# Patient Record
Sex: Female | Born: 1945 | Race: White | Hispanic: No | Marital: Married | Smoking: Never smoker
Health system: Southern US, Community
[De-identification: ages and names within clinical notes are randomized; demographics above are authoritative.]

## PROBLEM LIST (undated history)

## (undated) DIAGNOSIS — K648 Other hemorrhoids: Secondary | ICD-10-CM

## (undated) DIAGNOSIS — K31A0 Gastric intestinal metaplasia, unspecified: Secondary | ICD-10-CM

## (undated) DIAGNOSIS — E669 Obesity, unspecified: Secondary | ICD-10-CM

## (undated) DIAGNOSIS — I2699 Other pulmonary embolism without acute cor pulmonale: Secondary | ICD-10-CM

## (undated) DIAGNOSIS — A048 Other specified bacterial intestinal infections: Secondary | ICD-10-CM

## (undated) DIAGNOSIS — I10 Essential (primary) hypertension: Secondary | ICD-10-CM

## (undated) DIAGNOSIS — IMO0002 Reserved for concepts with insufficient information to code with codable children: Secondary | ICD-10-CM

## (undated) DIAGNOSIS — M5030 Other cervical disc degeneration, unspecified cervical region: Secondary | ICD-10-CM

## (undated) DIAGNOSIS — K3189 Other diseases of stomach and duodenum: Secondary | ICD-10-CM

## (undated) DIAGNOSIS — Z923 Personal history of irradiation: Secondary | ICD-10-CM

## (undated) DIAGNOSIS — E43 Unspecified severe protein-calorie malnutrition: Secondary | ICD-10-CM

## (undated) DIAGNOSIS — IMO0001 Reserved for inherently not codable concepts without codable children: Secondary | ICD-10-CM

## (undated) DIAGNOSIS — D126 Benign neoplasm of colon, unspecified: Secondary | ICD-10-CM

## (undated) DIAGNOSIS — K8020 Calculus of gallbladder without cholecystitis without obstruction: Secondary | ICD-10-CM

## (undated) DIAGNOSIS — K2950 Unspecified chronic gastritis without bleeding: Secondary | ICD-10-CM

## (undated) DIAGNOSIS — Z9221 Personal history of antineoplastic chemotherapy: Secondary | ICD-10-CM

## (undated) DIAGNOSIS — Z9289 Personal history of other medical treatment: Secondary | ICD-10-CM

## (undated) DIAGNOSIS — C259 Malignant neoplasm of pancreas, unspecified: Secondary | ICD-10-CM

## (undated) DIAGNOSIS — Z87828 Personal history of other (healed) physical injury and trauma: Secondary | ICD-10-CM

## (undated) DIAGNOSIS — G629 Polyneuropathy, unspecified: Secondary | ICD-10-CM

## (undated) DIAGNOSIS — K559 Vascular disorder of intestine, unspecified: Secondary | ICD-10-CM

## (undated) DIAGNOSIS — I480 Paroxysmal atrial fibrillation: Secondary | ICD-10-CM

## (undated) DIAGNOSIS — K5790 Diverticulosis of intestine, part unspecified, without perforation or abscess without bleeding: Secondary | ICD-10-CM

## (undated) DIAGNOSIS — J189 Pneumonia, unspecified organism: Secondary | ICD-10-CM

## (undated) DIAGNOSIS — M1990 Unspecified osteoarthritis, unspecified site: Secondary | ICD-10-CM

## (undated) DIAGNOSIS — C541 Malignant neoplasm of endometrium: Secondary | ICD-10-CM

## (undated) DIAGNOSIS — I4891 Unspecified atrial fibrillation: Secondary | ICD-10-CM

## (undated) DIAGNOSIS — K219 Gastro-esophageal reflux disease without esophagitis: Secondary | ICD-10-CM

## (undated) HISTORY — DX: Gastric intestinal metaplasia, unspecified: K31.A0

## (undated) HISTORY — PX: SHOULDER OPEN ROTATOR CUFF REPAIR: SHX2407

## (undated) HISTORY — PX: KNEE ARTHROSCOPY: SUR90

## (undated) HISTORY — DX: Benign neoplasm of colon, unspecified: D12.6

## (undated) HISTORY — DX: Unspecified osteoarthritis, unspecified site: M19.90

## (undated) HISTORY — PX: BACK SURGERY: SHX140

## (undated) HISTORY — PX: FRACTURE SURGERY: SHX138

## (undated) HISTORY — DX: Other hemorrhoids: K64.8

## (undated) HISTORY — DX: Malignant neoplasm of endometrium: C54.1

## (undated) HISTORY — PX: ANKLE RECONSTRUCTION: SHX1151

## (undated) HISTORY — PX: TOTAL SHOULDER ARTHROPLASTY: SHX126

## (undated) HISTORY — DX: Other specified bacterial intestinal infections: A04.8

## (undated) HISTORY — PX: JOINT REPLACEMENT: SHX530

## (undated) HISTORY — DX: Reserved for inherently not codable concepts without codable children: IMO0001

## (undated) HISTORY — DX: Obesity, unspecified: E66.9

## (undated) HISTORY — DX: Essential (primary) hypertension: I10

## (undated) HISTORY — DX: Paroxysmal atrial fibrillation: I48.0

## (undated) HISTORY — DX: Reserved for concepts with insufficient information to code with codable children: IMO0002

## (undated) HISTORY — PX: TUBAL LIGATION: SHX77

## (undated) HISTORY — DX: Personal history of irradiation: Z92.3

## (undated) HISTORY — DX: Other diseases of stomach and duodenum: K31.89

## (undated) HISTORY — DX: Personal history of other (healed) physical injury and trauma: Z87.828

## (undated) HISTORY — PX: HEEL SPUR SURGERY: SHX665

## (undated) HISTORY — DX: Unspecified chronic gastritis without bleeding: K29.50

## (undated) HISTORY — DX: Other cervical disc degeneration, unspecified cervical region: M50.30

## (undated) HISTORY — DX: Calculus of gallbladder without cholecystitis without obstruction: K80.20

---

## 1998-01-18 ENCOUNTER — Inpatient Hospital Stay (HOSPITAL_COMMUNITY): Admission: RE | Admit: 1998-01-18 | Discharge: 1998-01-19 | Payer: Self-pay | Admitting: Specialist

## 1998-10-30 ENCOUNTER — Encounter: Admission: RE | Admit: 1998-10-30 | Discharge: 1998-12-14 | Payer: Self-pay | Admitting: Specialist

## 2003-10-11 ENCOUNTER — Encounter: Admission: RE | Admit: 2003-10-11 | Discharge: 2003-10-11 | Payer: Self-pay | Admitting: Internal Medicine

## 2003-10-24 ENCOUNTER — Inpatient Hospital Stay (HOSPITAL_COMMUNITY): Admission: AD | Admit: 2003-10-24 | Discharge: 2003-10-26 | Payer: Self-pay | Admitting: Orthopedic Surgery

## 2003-11-29 ENCOUNTER — Encounter: Admission: RE | Admit: 2003-11-29 | Discharge: 2004-01-02 | Payer: Self-pay | Admitting: Orthopedic Surgery

## 2005-09-23 DIAGNOSIS — D126 Benign neoplasm of colon, unspecified: Secondary | ICD-10-CM

## 2005-09-23 HISTORY — DX: Benign neoplasm of colon, unspecified: D12.6

## 2006-06-17 ENCOUNTER — Encounter: Admission: RE | Admit: 2006-06-17 | Discharge: 2006-06-17 | Payer: Self-pay | Admitting: Specialist

## 2006-06-23 ENCOUNTER — Ambulatory Visit: Payer: Self-pay | Admitting: Internal Medicine

## 2006-07-01 ENCOUNTER — Ambulatory Visit: Payer: Self-pay | Admitting: Internal Medicine

## 2006-07-01 ENCOUNTER — Encounter (INDEPENDENT_AMBULATORY_CARE_PROVIDER_SITE_OTHER): Payer: Self-pay | Admitting: *Deleted

## 2007-02-25 ENCOUNTER — Encounter: Admission: RE | Admit: 2007-02-25 | Discharge: 2007-02-25 | Payer: Self-pay | Admitting: Specialist

## 2007-12-17 ENCOUNTER — Encounter: Admission: RE | Admit: 2007-12-17 | Discharge: 2007-12-17 | Payer: Self-pay | Admitting: Orthopedic Surgery

## 2008-01-22 ENCOUNTER — Ambulatory Visit (HOSPITAL_COMMUNITY): Admission: RE | Admit: 2008-01-22 | Discharge: 2008-01-23 | Payer: Self-pay | Admitting: Orthopedic Surgery

## 2008-05-18 ENCOUNTER — Ambulatory Visit: Payer: Self-pay | Admitting: Cardiovascular Disease

## 2008-06-14 ENCOUNTER — Ambulatory Visit: Payer: Self-pay

## 2008-06-14 ENCOUNTER — Encounter: Payer: Self-pay | Admitting: Cardiovascular Disease

## 2008-07-18 ENCOUNTER — Ambulatory Visit: Payer: Self-pay | Admitting: Cardiovascular Disease

## 2008-09-23 DIAGNOSIS — Z87828 Personal history of other (healed) physical injury and trauma: Secondary | ICD-10-CM

## 2008-09-23 HISTORY — DX: Personal history of other (healed) physical injury and trauma: Z87.828

## 2008-11-05 ENCOUNTER — Emergency Department (HOSPITAL_COMMUNITY): Admission: EM | Admit: 2008-11-05 | Discharge: 2008-11-05 | Payer: Self-pay | Admitting: Emergency Medicine

## 2008-12-05 ENCOUNTER — Encounter: Admission: RE | Admit: 2008-12-05 | Discharge: 2008-12-05 | Payer: Self-pay | Admitting: Family Medicine

## 2009-01-01 ENCOUNTER — Encounter: Admission: RE | Admit: 2009-01-01 | Discharge: 2009-01-01 | Payer: Self-pay | Admitting: Orthopedic Surgery

## 2009-02-24 ENCOUNTER — Encounter (INDEPENDENT_AMBULATORY_CARE_PROVIDER_SITE_OTHER): Payer: Self-pay | Admitting: *Deleted

## 2009-08-06 ENCOUNTER — Emergency Department (HOSPITAL_COMMUNITY): Admission: EM | Admit: 2009-08-06 | Discharge: 2009-08-06 | Payer: Self-pay | Admitting: Emergency Medicine

## 2009-11-18 ENCOUNTER — Encounter: Admission: RE | Admit: 2009-11-18 | Discharge: 2009-11-18 | Payer: Self-pay | Admitting: Unknown Physician Specialty

## 2010-03-30 ENCOUNTER — Encounter: Admission: RE | Admit: 2010-03-30 | Discharge: 2010-03-30 | Payer: Self-pay | Admitting: Specialist

## 2010-06-14 ENCOUNTER — Encounter: Admission: RE | Admit: 2010-06-14 | Discharge: 2010-06-14 | Payer: Self-pay | Admitting: Family Medicine

## 2010-09-23 DIAGNOSIS — C541 Malignant neoplasm of endometrium: Secondary | ICD-10-CM

## 2010-09-23 HISTORY — DX: Malignant neoplasm of endometrium: C54.1

## 2010-09-23 HISTORY — PX: ABDOMINAL HYSTERECTOMY: SHX81

## 2010-11-13 ENCOUNTER — Other Ambulatory Visit: Payer: Self-pay | Admitting: Obstetrics and Gynecology

## 2010-11-21 ENCOUNTER — Ambulatory Visit: Payer: 59 | Attending: Gynecologic Oncology | Admitting: Gynecologic Oncology

## 2010-11-21 DIAGNOSIS — C549 Malignant neoplasm of corpus uteri, unspecified: Secondary | ICD-10-CM | POA: Insufficient documentation

## 2010-11-21 DIAGNOSIS — Z800 Family history of malignant neoplasm of digestive organs: Secondary | ICD-10-CM | POA: Insufficient documentation

## 2010-11-21 DIAGNOSIS — Z803 Family history of malignant neoplasm of breast: Secondary | ICD-10-CM | POA: Insufficient documentation
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
## 2011-01-08 LAB — URINE MICROSCOPIC-ADD ON

## 2011-01-08 LAB — POCT I-STAT, CHEM 8
Chloride: 103 mEq/L (ref 96–112)
Creatinine, Ser: 0.7 mg/dL (ref 0.4–1.2)
Hemoglobin: 13.6 g/dL (ref 12.0–15.0)
Potassium: 3.7 mEq/L (ref 3.5–5.1)
Sodium: 138 mEq/L (ref 135–145)

## 2011-01-08 LAB — URINALYSIS, ROUTINE W REFLEX MICROSCOPIC
Bilirubin Urine: NEGATIVE
Glucose, UA: NEGATIVE mg/dL
Hgb urine dipstick: NEGATIVE
Ketones, ur: NEGATIVE mg/dL
Protein, ur: NEGATIVE mg/dL

## 2011-02-05 NOTE — Op Note (Signed)
NAME:  [name], [name] NO.:  [id number]   MEDICAL RECORD NO.:  [id number]          PATIENT TYPE:  OIB   LOCATION:  5001                         FACILITY:  [facility]   PHYSICIAN:  Almedia Balls. Ranell Patrick, M.D. DATE OF BIRTH:  03/10/1946   DATE OF PROCEDURE:  DATE OF DISCHARGE:                               OPERATIVE REPORT   PREOPERATIVE DIAGNOSIS:  Left shoulder end-stage osteoarthritis.   POSTOPERATIVE DIAGNOSIS:  Left shoulder end-stage osteoarthritis.   PROCEDURE PERFORMED:  Left total shoulder replacement using DePuy Global  Advantage System with Anchor Peg Glenoid.   ATTENDING PHYSICIAN:  Almedia Balls. Ranell Patrick, MD   ASSISTANT:  Donnie Coffin. Durwin Nora, P.A.   ANESTHESIA:  General anesthesia plus interscalene block anesthesia was  used.   ESTIMATED BLOOD LOSS:  200 mL.   FLUIDS REPLACEMENT:  2300 mL crystalloid and 500 mL of Hextend.   URINE OUTPUT:  150 mL.   INSTRUMENT COUNT:  Correct.   COMPLICATIONS:  None.   Preoperative antibiotics were given.   INDICATIONS:  The patient is a 64 year old female with worsening left  shoulder pain secondary to severe arthritis.  The patient has failed  conservative management consistent with injections, anti-inflammatories,  active modifications now presents for operative shoulder replacement.  Informed consent was obtained.   DESCRIPTION OF PROCEDURE:  After an adequate level of anesthesia was  achieved, the patient was positioned in a modified beach-chair position.  Left shoulder exam under anesthesia, she had passive range of motion 20  degrees, internal rotation 10 degrees, arm abducted, port elevation 90  degrees.  We then sterilely prepped and draped the left shoulder in the  usual manner, we began at the shoulder through a deltopectoral approach  using the cauda equina reference point extending down to the anterior  humeral line.  Dissection was done through subcutaneous tissues using  Bovie electrocautery.  We found the  cephalic vein, protected that and  took it laterally with the deltoid.  The pectoralis was taken medially  and the upper 1.5 cm pectoralis was released.  Conjoined tendon taken  medially as well.  This revealed the bicipital groove, we took off the  subscapularis sharply using the needle-point Bovie about a 0.5 cm medial  to the left tuberosity.  We placed #2 FiberWire sutures in a modified  Mason-Allen suture technique into the free end of the tendon.  We  divided that free from the anterior capsule and removed some of that  capsule, progressively released the capsule off the inferior humerus and  removed large osteophytes inferiorly.  At this point, we could  appropriately translate the humerus anteriorly and externally rotate  such that we could perform osteotomy.  We used the neck resection guide  and reference of the insertion of the rotator cuff on the greater  tuberosity and made her osteotomy with the elbow at the patient side and  a forearm externally rotated approximately 10-15 degrees.  This gave [place]  10-15 degrees of retroversion on our cut.  We were happy with our  initial cut and went ahead and prepared the rest of the humerus using  sequential  reamers up to a size 12 and then using the box osteotome  followed by the serial broaching up to size 12 stem.  With the 12 stem  in good position, rechecked our retroversion on the humeral side.  We  are happy with that and at this point, went ahead and retracted our  humerus posteriorly, we did a 360-degree capsule release removing all of  the labrum.  There was complete erosion of the articular cartilage on  the glenoid.  The glenoid sized up to a size 40.  We drilled our central  hole marking at 12 o'clock, 6 o'clock, 3 and 9 o'clock positions and  drilling centrally and then placing our Anchor Peg glenoid guide, again  referencing off the inferior scapular neck at the 6 o'clock position,  drilled our superior and anterior, inferior  and posterior drill holes,  placed our trials 40, anchor peg glenoid in place and impacted that in,  has had a great fit.  We had actually reamed with a 40 reamer prior to  drilling holes for the Anchor Peg glenoid.  We did not have any  significant bone loss either of the periphery or centrally from cystic  formation.  At this point, we went ahead and cemented our 40 Anchor Peg  glenoid in place.  We pressurized DePuy Smartset cement anterior to the  3 holes for the peripheral pegs, the central peg, we went ahead into  left without cement and impacted the real polyethylene insert in place,  held that until 15 minutes for the labs and all the cement was hardened,  removed the excess cement and then directed our attention towards the  humeral side.  We removed the trial component.  We then impaction  grafted the real 12 stem in appropriate version with available bones in  the humeral head.  This had a nice tight fit.  We then looked at the  remaining humeral coverage that we needed and this fit nicely with a 44  x 18 eccentric head, which rotated to the superior posterior position  for good coverage.  We had actually placed #2 FiberWires in a mattress  fashion prior to placing our stem and repaired her subscapularis using  full thick sutures with the FiberWire.  This gave a nice repair.  We  also resected the upper portion that would join the rotator interval  giving a very secure repair with at least 6 or 7 stranded sutures  passing through the subscapularis and connecting directly with bone-to-  bone tunnels.  At this point, we took the shoulder through a full range  of motion with excellent forward elevation, may able to easily rest the  arm on the stomach and rotated up about 20-30 degrees even with the  subscapularis repaired, thoroughly irrigated and then closed the  deltopectoral interval with 0-Vicryl suture followed by 2-0 Vicryl  subcutaneous closure and 4-0 Monocryl for the skin.   Steri-Strips were  applied followed by sterile dressing.  The patient tolerated the surgery  well.      Almedia Balls. Ranell Patrick, M.D.  Electronically Signed     SRN/MEDQ  D:  01/22/2008  T:  01/23/2008  Job:  [id number]

## 2011-02-05 NOTE — Assessment & Plan Note (Signed)
[name]                            CARDIOLOGY OFFICE NOTE   [name], [name]                    MRN:          [id number]  DATE:07/18/2008                            DOB:          04-May-1946    [name] returns today for followup.  She has had atypical chest pain,  shortness breath, and palpitations.  She is a bit anxious.  She had a  normal stress echo performed on June 15, 2008.  Since I last saw  her, her shortness of breath is stable and not worsening, it continues  to be functional.  No PND or orthopnea.  No evidence of heart failure.  Her palpitations however have had occasional worsening.  After I last  saw her at the end of August, she had 3 days of palpitations.  She  indicates that her heart was beating fast and pounding in her chest.  She did not take any nitro.  After a while, she had a bit of atypical  chest pain.  The whole bout seemed to start with her shoulder pain.  She  is status post shoulder surgery and still has poor range of motion.  She  started to have pain in the left shoulder, which radiated up the neck  and down the rest of the arm.  She takes Robaxin for this on occasion.   I told [name] that I thought her heart was fine and that if she were  to have recurrent palpitations, we could arrange an event monitor for.   Review of systems is otherwise negative.   She is allergic to SULFA and CODEINE.   She is on Osteo Bi-Flex and Robaxin.   She has p.r.n. Tylenol, Ambien, and propranolol at home.  She did not  take any propranolol for her palpitations.   Exam is remarkable for an overweight white female in no distress.  Her  blood pressure is 128/87, pulse 84 and regular, respiratory rate 14,  afebrile, weight 208.  HEENT unremarkable.  Carotids normal without  bruit.  No lymphadenopathy, thyromegaly, or JVP elevation.  Lungs are  clear, good diaphragmatic motion.  No wheezing.  S1-S2.  Normal heart  sounds.  PMI normal.  Abdomen is benign.  Bowel sounds positive.  No  AAA, no tenderness, no bruit, no hepatosplenomegaly or hepatojugular  reflux, no tenderness.  Distal pulses are intact.  No edema.  Neuro  nonfocal.  Skin warm and dry.  No muscular weakness.  She has poor range  of motion and unable to move her left arm above her shoulder.   IMPRESSION:  1. Shortness of breath, stable, deconditioning central obesity.  No      evidence of cardiopulmonary disease.  2. Palpitations benign.  Follow up event monitor for current.      Continue p.r.n., propranolol.  3. Atypical chest pain, normal stress echocardiogram.  Continue to      follow.  4. Left shoulder surgery with poor range of motion.  Continue Physical      Therapy/Occupational Therapy.   I will see her in 6 months.     Theron Arista  Phillips Hay, MD, [facility]  Electronically Signed    PCN/MedQ  DD: 07/18/2008  DT: 07/18/2008  Job #: [id number]

## 2011-02-05 NOTE — Assessment & Plan Note (Signed)
[facility]                            CARDIOLOGY OFFICE NOTE   [name], [name]                    MRN:          [id number]  DATE:05/18/2008                            DOB:          12-25-1945    A 65 year old patient referred for chest pain, palpitations, shortness  of breath.   [name] is a pleasant high-strung individual who was referred by Dr.  Collins Scotland for the above symptoms.  The patient has been having these  symptoms over the last couple of months.   She indicates onset of sudden palpitations.  She has had multiple  episodes of them and she gets, they can last good part of the day.  She  gets associated chest tightness with them and also some shortness of  breath.  She is status post recent left shoulder replacement and still  rehabbing this.  She also has significant anxiety.  She would not tell  me what it is that gets her upset, but her husband indicates that she  would like to fix every problem in the world.   The patient has not had any previously documented history of coronary  artery disease or atrial arrhythmias.  There has been no history of PE.  She does not smoke and does not have chronic COPD or lung disease.   Her review of systems otherwise remarkable for occasional headaches.  There has been no active wheezing.   She normally sees Dr. Collins Scotland for general healthcare.  She has had some  insomnia.   Her past medical history is otherwise remarkable for left shoulder  replacement by Dr. Ranell Patrick, previous knee problems and tubal ligation.   The patient is happily married.  She has two children.  She sometimes  drives a bus.  She is starting to get back towards walking on a  treadmill.  She walked a mile yesterday without sequela and is just  getting back to it after her shoulder surgery.   Family history is remarkable for mother having heart failure, as well as  father having heart failure.  She did not give ages of  their time of  death.  There is no premature coronary disease.   MEDICATIONS:  Her current medications include;  1. Skelaxin 800 t.i.d.  2. Zolpidem 10 a day.  3. Osteo Bi-Flex   She is allergic and SULFA and CODEINE.   PHYSICAL EXAMINATION:  GENERAL:  Remarkable for an overweight white  female who is animated.  VITAL SIGNS:  Her blood pressure is 126/70, pulse is 75 and regular,  respiratory rate 14, afebrile.  Weight is 210.  HEENT:  Unremarkable.  NECK:  Carotids are without bruit, no lymphadenopathy, no thyromegaly,  or JVP elevation.  LUNGS:  Clear to diaphragmatic motion.  No wheezing.  HEART:  S1 and S2.  Normal heart sounds.  PMI not palpable.  ABDOMEN:  Benign.  Bowel sounds positive.  No AAA, no tenderness, no  bruit, no hepatosplenomegaly, or no hepatojugular reflux.  No  tenderness.  EXTREMITIES:  Distal pulses were intact.  No edema.  NEURO:  Nonfocal.  SKIN:  Warm  and dry.  She continues to have slightly decreased range of  motion in her left upper extremity after her shoulder surgery.   EKG is normal.   IMPRESSION:  1. Palpitations, they sound benign, p.r.n. Inderal given to take as      needed, likely related to anxiety state.  The patient will call to      get an event monitor if she has recurrent episodes.  She has not      had any in the past week.  I am assuming that Dr. Collins Scotland did typical      lab work including ruling out significant anemia or thyroid      disease.  2. Chest pain, atypical.  Followup stress echo.  EKG at baseline is      normal.  3. Dyspnea, likely related to being out of shape and overweight.      Echocardiogram to assess right ventricular and left ventricular      function will be done as part of her stress echo.  4. Recent shoulder surgery.  Continue rehab.  Increased range of      motion needed as the patient appears to have a slight frozen      shoulder.   I will see her back in 8-10 weeks and we will see if she has had to take   her Inderal, but I suspect she has structurally normal heart.     Noralyn Pick. Eden Emms, MD, [facility]  Electronically Signed    PCN/MedQ  DD: 05/18/2008  DT: 05/19/2008  Job #: [id number]   cc:   Tammy R. Collins Scotland, M.D.

## 2011-05-06 ENCOUNTER — Other Ambulatory Visit: Payer: Self-pay | Admitting: Family Medicine

## 2011-05-06 DIAGNOSIS — Z1231 Encounter for screening mammogram for malignant neoplasm of breast: Secondary | ICD-10-CM

## 2011-06-12 ENCOUNTER — Ambulatory Visit: Payer: 59 | Attending: Gynecologic Oncology | Admitting: Gynecologic Oncology

## 2011-06-12 DIAGNOSIS — C549 Malignant neoplasm of corpus uteri, unspecified: Secondary | ICD-10-CM | POA: Insufficient documentation

## 2011-06-12 DIAGNOSIS — Z90710 Acquired absence of both cervix and uterus: Secondary | ICD-10-CM | POA: Insufficient documentation

## 2011-06-12 DIAGNOSIS — Z9079 Acquired absence of other genital organ(s): Secondary | ICD-10-CM | POA: Insufficient documentation

## 2011-06-14 NOTE — Consult Note (Signed)
NAME:  [name], [name] NO.:  [id number]  MEDICAL RECORD NO.:  [id number]  LOCATION:  GYN                          FACILITY:  [facility]  PHYSICIAN:  Alaira Level A. Duard Brady, MD    DATE OF BIRTH:  1946-04-14  DATE OF CONSULTATION: DATE OF DISCHARGE:                                CONSULTATION   [name] is a very pleasant 65 year old with postmenopausal bleeding. She went through menopause in her early 24s and never took any HRT. 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  On November 30, 2010 she underwent a total robotic hysterectomy, bilateral salpingo-oophorectomy.  Operative findings included a uterus with a grade 1 lesion with minimal invasion on frozen section.  She had a left ovarian fibroma and a normal-appearing right tube and ovary.  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  There was 18% myometrial invasion.  No lymphovascular space involvement, negative adnexa and the washings were negative.  Her final stage was a 1A grade 1 endometrioid adenocarcinoma. She was dispositioned to close followup.  She had a postoperative check with me at [facility] in April and comes in today for her first surveillance visit.  She is overall doing great.  She has lost approximately 30 pounds since we first saw her.  She states, when I told her that her obesity was linked to the endometrial cancer, that I put the "fear of God" in her and she really decided to change her life.  She is walking two to four miles per day.  She is trying to decrease her p.o. and is really feeling quite well, she states she feels excellent.  She has overall felt "cold" since her surgery.  Herb Grays is her primary physician and she has not gone back to see her.  She does not feel dizzy or lightheaded but has been noted to have a few low blood pressures since her surgery, most recently being 82/60 today.  There is no vasomotor  symptoms otherwise.  She has no dizziness, lightheadedness. She does not feel faint.  She denies any change in her bowel or bladder habits.  She has no vaginal bleeding.  10-point review of systems is negative.  PHYSICAL EXAMINATION:  Weight 182 pounds down from 216, height 5 feet 4 inches.  Blood pressure 82/60, pulse 80, respirations 16, temperature 97.7.  Well-nourished, well-developed female in no acute distress.  Neck is supple.  There is no lymphadenopathy, no adenopathy.  No thyromegaly. LUNGS:  Clear to auscultation bilaterally.  CARDIOVASCULAR:  Regular rate and rhythm.  ABDOMEN:  Shows well-healed surgical incisions. Abdomen is soft, nontender, nondistended.  No palpable masses or positive splenomegaly.  Groins are negative for adenopathy. EXTREMITIES:  She has no edema.  PELVIC:  External genitalia is within normal limits.  The vagina is atrophic.  The vaginal cuff is visualized. There is no visible lesions.  Bimanual examination reveals no masses or nodularity.  Rectal confirms.  ASSESSMENT:  This is a 65 year old with a stage IA grade 1 endometrioid adenocarcinoma who clinically has no evidence of recurrent disease.  PLAN:  She will see Dr. Vincente Poli in 6 months at which time she will  have a Pap smear.  She will return to see me in 1 year.     Doni Bacha A. Duard Brady, MD     PAG/MEDQ  D:  06/12/2011  T:  06/13/2011  Job:  [id number]  cc:   Marcelino Duster L. Vincente Poli, M.D. Fax: [phone]  Telford Nab, R.N. [address] [place], [place] [address]  Herb Grays, MD  Electronically Signed by Cleda Mccreedy MD on 06/14/2011 07:41:12 AM

## 2011-06-17 ENCOUNTER — Ambulatory Visit
Admission: RE | Admit: 2011-06-17 | Discharge: 2011-06-17 | Disposition: A | Discharge status: Home / Self Care | Payer: 59 | Source: Ambulatory Visit | Attending: Family Medicine | Admitting: Family Medicine

## 2011-06-17 DIAGNOSIS — Z1231 Encounter for screening mammogram for malignant neoplasm of breast: Secondary | ICD-10-CM

## 2011-06-18 LAB — BASIC METABOLIC PANEL
GFR calc non Af Amer: >60
Glucose, Bld: 102 — ABNORMAL HIGH
Potassium: 4.4
Sodium: 136

## 2011-06-18 LAB — URINALYSIS, ROUTINE W REFLEX MICROSCOPIC
Glucose, UA: NEGATIVE
Specific Gravity, Urine: 1.011
pH: 7

## 2011-06-18 LAB — URINE MICROSCOPIC-ADD ON

## 2011-06-18 LAB — DIFFERENTIAL
Eosinophils Relative: 3
Lymphocytes Relative: 33
Lymphs Abs: 2.4
Monocytes Absolute: 0.6

## 2011-06-18 LAB — CBC
HCT: 36.3
Hemoglobin: 12.8
WBC: 7.2

## 2011-06-18 LAB — ABO/RH: ABO/RH(D): O POS

## 2011-08-29 ENCOUNTER — Encounter: Payer: Self-pay | Admitting: Internal Medicine

## 2011-10-02 ENCOUNTER — Encounter: Payer: Self-pay | Admitting: Internal Medicine

## 2011-10-17 ENCOUNTER — Encounter: Payer: Self-pay | Admitting: Internal Medicine

## 2011-10-17 ENCOUNTER — Ambulatory Visit (AMBULATORY_SURGERY_CENTER): Payer: 59 | Admitting: *Deleted

## 2011-10-17 ENCOUNTER — Telehealth: Payer: Self-pay | Admitting: *Deleted

## 2011-10-17 DIAGNOSIS — Z1211 Encounter for screening for malignant neoplasm of colon: Secondary | ICD-10-CM

## 2011-10-17 DIAGNOSIS — Z86010 Personal history of colonic polyps: Secondary | ICD-10-CM

## 2011-10-17 DIAGNOSIS — Z800 Family history of malignant neoplasm of digestive organs: Secondary | ICD-10-CM

## 2011-10-17 MED ORDER — PEG-KCL-NACL-NASULF-NA ASC-C 100 G PO SOLR: ORAL | Status: DC

## 2011-10-17 NOTE — Telephone Encounter (Signed)
If she is interested I can evaluate her abd. Pain in the office and then decide what to do. For now she is scheduled for screening colonoscopy.

## 2011-10-17 NOTE — Progress Notes (Signed)
Pt states that her family doctor, Herb Grays, has been treating her for a "bacterial infection" in her stomach and large intestine.  She has finished her Prevpack as prescribed.  She states she still has abdominal pain to her left side that radiates to her back.  No relief.  She denies diarrhea, bleeding, vomiting.  Dr. Juanda Chance made aware of this prior to procedure

## 2011-10-17 NOTE — Telephone Encounter (Signed)
Dr. Juanda Chance, This is FYI.  She came in for her PV today.  Pt states that her family doctor, Herb Grays, has been treating her for a "bacterial infection" in her stomach and large intestine.  She has finished her Prevpack as prescribed.  She states she still has abdominal pain to her left side that radiates to her back.  No relief.  She denies diarrhea, bleeding, vomiting.   I told her that I would let you know about this prior to her colonoscopy in case you wanted to do anything else.  Thank you, Baxter Hire

## 2011-10-18 NOTE — Telephone Encounter (Signed)
She states that she is ok with just having her colonoscopy as scheduled.

## 2011-10-22 ENCOUNTER — Encounter: Payer: Self-pay | Admitting: Cardiology

## 2011-10-23 ENCOUNTER — Ambulatory Visit (INDEPENDENT_AMBULATORY_CARE_PROVIDER_SITE_OTHER): Payer: 59 | Admitting: Cardiology

## 2011-10-23 ENCOUNTER — Encounter: Payer: Self-pay | Admitting: Cardiology

## 2011-10-23 DIAGNOSIS — M542 Cervicalgia: Secondary | ICD-10-CM

## 2011-10-23 DIAGNOSIS — I4891 Unspecified atrial fibrillation: Secondary | ICD-10-CM

## 2011-10-23 DIAGNOSIS — I480 Paroxysmal atrial fibrillation: Secondary | ICD-10-CM | POA: Insufficient documentation

## 2011-10-23 DIAGNOSIS — R079 Chest pain, unspecified: Secondary | ICD-10-CM

## 2011-10-23 LAB — CBC WITH DIFFERENTIAL/PLATELET
Basophils Relative: 0.5 % (ref 0.0–3.0)
Eosinophils Absolute: 0.4 10*3/uL (ref 0.0–0.7)
Lymphs Abs: 2.2 10*3/uL (ref 0.7–4.0)
MCHC: 34.2 g/dL (ref 30.0–36.0)
MCV: 87 fl (ref 78.0–100.0)
Monocytes Absolute: 0.5 10*3/uL (ref 0.1–1.0)
Neutrophils Relative %: 45.8 % (ref 43.0–77.0)
RBC: 4.47 Mil/uL (ref 3.87–5.11)

## 2011-10-23 LAB — BASIC METABOLIC PANEL
BUN: 19 mg/dL (ref 6–23)
CO2: 28 mEq/L (ref 19–32)
Chloride: 103 mEq/L (ref 96–112)
Creatinine, Ser: 0.7 mg/dL (ref 0.4–1.2)

## 2011-10-23 MED ORDER — METOPROLOL SUCCINATE ER 50 MG PO TB24: 50.0000 mg | ORAL_TABLET | Freq: Every day | ORAL | Status: DC

## 2011-10-23 MED ORDER — RIVAROXABAN 20 MG PO TABS: 20.0000 mg | ORAL_TABLET | Freq: Every day | ORAL | Status: DC

## 2011-10-23 NOTE — Assessment & Plan Note (Signed)
Patient woke up last night with neck and bilateral arm pain associated with tachypalpitations.  She also has had on and off nonexertional chest pressure.  She has new-onset atrial fibrillation.  Given these symptoms and new-onset atrial fibrillation, I will get an ETT-myoview to assess for ischemia.

## 2011-10-23 NOTE — Patient Instructions (Addendum)
Your physician recommends that you schedule a follow-up appointment in 2 weeks with Dr. Shirlee Latch.  Your physician has requested that you have an echocardiogram. Echocardiography is a painless test that uses sound waves to create images of your heart. It provides your doctor with information about the size and shape of your heart and how well your heart's chambers and valves are working. This procedure takes approximately one hour. There are no restrictions for this procedure.  Your physician has requested that you have en exercise stress myoview. For further information please visit [email or web address] Please follow instruction sheet, as given.  If you start Xarelto 20mg  daily, stop coumadin(warfarin).  If you stay on coumadin, you should call our office and schedule a visit in the coumadin clinic.  Start Toprol XL 50 mg daily  Your physician recommends that you have the following lab work today:  CBC & BMET

## 2011-10-23 NOTE — Assessment & Plan Note (Addendum)
Patient is in atrial fibrillation today.  I suspect she may have been in atrial fibrillation for the last 4 weeks or so when she has been noting her heart flutter.  She is symptomatic with atrial fibrillation.  Her episode last night may have been due to uncontrolled rate.  Today the HR is in the 80s.  CHADSVASC score is 1 (female gender).   - Start Toprol XL 50 mg daily.  - We talked about DCCV.  I think this would be reasonable as this is the patient's first documented episode and she is symptomatic.   - Stop coumadin (only took 1 dose).  Start rivaroxaban 20 mg daily.  After 1 month on rivaroxaban, I will bring her to the hospital to cardiovert her.   - CBC, BMET, TSH needed.  - Echocardiogram.

## 2011-10-23 NOTE — Progress Notes (Signed)
PCP: Dr. Collins Scotland  66 yo presents for evaluation of atrial fibrillation.  For the last 4 weeks or so, she has noted her heart fluttering and beating irregularly.  At times it is uncomfortable.  From time to time she gets pressure in her chest.  This is not related to exertion. Yesterday, she had an ECG done when she went for an outpatient surgery (for a corn on her foot) that showed atrial fibrillation.  She was sent to Dr. Alda Berthold office, where atrial fibrillation was confirmed and she was given a prescription for warfarin.  Last night, she woke up short of breath with her heart racing.  She had severe pain in her neck and bilateral arms.  This lasted for 2-3 hours then resolved.   She has not been getting exertional dyspnea or exertional chest pain, but the fluttering sensation from atrial fibrillation makes her uncomfortable.  She had palpitations back in 2009.  Part of the workup was a stress echo that was normal.  She was not told at the time that she had atrial fibrillation.   ECG; Atrial fibrillation, rate 80  PMH: 1. Atrial fibrillation: first noted in 1/13. 2. Osteoarthritis: Shoulder replacement in 5/09.  3. H/o traumatic c-spine fracture.  4. Endometrial cancer in 3/12.  Hysterectomy.  5. Stress echo in 9/09 was normal.   SH: Lives in [place], married, does not work, no smoking.    FH: CAD, CHF in father; CVA in mother.   ROS: All systems reviewed and negative except as per HPI.   Current Outpatient Prescriptions  Medication Sig Dispense Refill  . Acetaminophen (TYLENOL PO) Take by mouth as needed.      Marland Kitchen ESZOPICLONE 3 MG tablet 0.5 tablets as needed.      . metoprolol succinate (TOPROL XL) 50 MG 24 hr tablet Take 1 tablet (50 mg total) by mouth daily. Take with or immediately following a meal.  30 tablet  11  . Rivaroxaban (XARELTO) 20 MG TABS Take 20 mg by mouth daily.  30 tablet  11    BP 131/87  Pulse 80  Ht 5\' 4"  (1.626 m)  Wt 85.186 kg (187 lb 12.8 oz)  BMI 32.24  kg/m2 General: NAD Neck: No JVD, no thyromegaly or thyroid nodule.  Lungs: Clear to auscultation bilaterally with normal respiratory effort. CV: Nondisplaced PMI.  Heart irregular S1/S2, no S3/S4, no murmur.  Trace ankle edema.  No carotid bruit.  Normal pedal pulses.  Abdomen: Soft, nontender, no hepatosplenomegaly, no distention.  Skin: Intact without lesions or rashes.  Neurologic: Alert and oriented x 3.  Psych: Normal affect. Extremities: No clubbing or cyanosis.  HEENT: Normal.

## 2011-10-25 ENCOUNTER — Telehealth: Payer: Self-pay | Admitting: *Deleted

## 2011-10-25 ENCOUNTER — Other Ambulatory Visit: Payer: Self-pay | Admitting: *Deleted

## 2011-10-25 DIAGNOSIS — I4891 Unspecified atrial fibrillation: Secondary | ICD-10-CM

## 2011-10-25 NOTE — Telephone Encounter (Signed)
Dion Body ','<More Detail >>       Marca Ancona, MD         Sent:  Wed October 23, 2011 11:26 PM                 Message     I forgot to get a TSH on this patient (new atrial fibrillation). Will you see if she can get a TSH drawn when she comes for [facility] or echo? Thanks.    Talked with pt 10/25/11. She is scheduled for TSH 11/04/11

## 2011-10-29 NOTE — Progress Notes (Signed)
Addended by: Judithe Modest D on: 10/29/2011 12:26 PM   Modules accepted: Orders

## 2011-11-01 ENCOUNTER — Encounter: Payer: 59 | Admitting: Internal Medicine

## 2011-11-04 ENCOUNTER — Ambulatory Visit (HOSPITAL_COMMUNITY): Payer: 59 | Attending: Cardiology | Admitting: Radiology

## 2011-11-04 ENCOUNTER — Other Ambulatory Visit (INDEPENDENT_AMBULATORY_CARE_PROVIDER_SITE_OTHER): Payer: 59 | Admitting: *Deleted

## 2011-11-04 DIAGNOSIS — I4891 Unspecified atrial fibrillation: Secondary | ICD-10-CM

## 2011-11-04 DIAGNOSIS — R0602 Shortness of breath: Secondary | ICD-10-CM | POA: Insufficient documentation

## 2011-11-04 DIAGNOSIS — Z8249 Family history of ischemic heart disease and other diseases of the circulatory system: Secondary | ICD-10-CM | POA: Insufficient documentation

## 2011-11-04 DIAGNOSIS — R079 Chest pain, unspecified: Secondary | ICD-10-CM | POA: Insufficient documentation

## 2011-11-04 DIAGNOSIS — R000 Tachycardia, unspecified: Secondary | ICD-10-CM | POA: Insufficient documentation

## 2011-11-04 DIAGNOSIS — R002 Palpitations: Secondary | ICD-10-CM | POA: Insufficient documentation

## 2011-11-04 MED ORDER — TECHNETIUM TC 99M TETROFOSMIN IV KIT
30.0000 | PACK | Freq: Once | INTRAVENOUS | Status: AC | PRN
  Administered 2011-11-04: 30 via INTRAVENOUS

## 2011-11-04 MED ORDER — TECHNETIUM TC 99M TETROFOSMIN IV KIT
10.0000 | PACK | Freq: Once | INTRAVENOUS | Status: AC | PRN
  Administered 2011-11-04: 10 via INTRAVENOUS

## 2011-11-04 NOTE — Progress Notes (Addendum)
[facility] SITE 3 NUCLEAR MED [address] [place] [address] [phone]  Cardiology Nuclear Med Study  [name] is a 66 y.o. female [id number] [birth year]   Nuclear Med Background Indication for Stress Test:  Evaluation for Ischemia and new diagnosis of A Fib and potential pre cardioversion History: 9/09 Echo: EF; 60%, AFIB Cardiac Risk Factors: Family History - CAD  Symptoms:  Chest Pain, Palpitations, Rapid HR and SOB   Nuclear Pre-Procedure Caffeine/Decaff Intake:  None NPO After: 10:00pm   Lungs:  clear IV 0.9% NS with Angio Cath:  20g  IV Site: R Hand  IV Started by:  Cathlyn Parsons, RN  Chest Size (in):  40 Cup Size: DD  Height: 5\' 4"  (1.626 m)  Weight:  190 lb (86.183 kg)  BMI:  Body mass index is 32.61 kg/(m^2). Tech Comments:  Toprol held x 24hrs    Nuclear Med Study 1 or 2 day study: 1 day  Stress Test Type:  Stress  Reading MD: Marca Ancona, MD  Order Authorizing Provider:  Fransico Meadow  Resting Radionuclide: Technetium 36m Tetrofosmin  Resting Radionuclide Dose: 11.0 mCi   Stress Radionuclide:  Technetium 82m Tetrofosmin  Stress Radionuclide Dose: 33.0 mCi           Stress Protocol Rest HR: 52 Stress HR: 134  Rest BP: 119/75 Stress BP: 187/105  Exercise Time (min): 7:00 METS: 8.50   Predicted Max HR: 156 bpm % Max HR: 85.9 bpm Rate Pressure Product: 21308   Dose of Adenosine (mg):  n/a Dose of Lexiscan: n/a mg  Dose of Atropine (mg): n/a Dose of Dobutamine: n/a mcg/kg/min (at max HR)  Stress Test Technologist: Milana Na, EMT-P  Nuclear Technologist:  Domenic Polite, CNMT     Rest Procedure:  Myocardial perfusion imaging was performed at rest 45 minutes following the intravenous administration of Technetium 8m Tetrofosmin. Rest ECG: Sinus Bradycardia with Pacs  Stress Procedure:  The patient exercised for 7:00.  The patient stopped due to fatigue and denied any chest pain.  There were no significant  ST-T wave changes and occ pacs.  Technetium 64m Tetrofosmin was injected at peak exercise and myocardial perfusion imaging was performed after a brief delay. Stress ECG: No significant change from baseline ECG  QPS Raw Data Images:  Normal; no motion artifact; normal heart/lung ratio. Stress Images:  Normal homogeneous uptake in all areas of the myocardium. Rest Images:  Normal homogeneous uptake in all areas of the myocardium. Subtraction (SDS):  There is no evidence of scar or ischemia. Transient Ischemic Dilatation (Normal <1.22):  1.10 Lung/Heart Ratio (Normal <0.45):  0.47  Quantitative Gated Spect Images QGS EDV:  NA QGS ESV:   NA QGS cine images:  Not gated QGS EF: Study not gated  Impression Exercise Capacity:  Fair exercise capacity. BP Response:  Hypertensive blood pressure response. Clinical Symptoms:  Fatigue, no chest pain.  ECG Impression:  No significant ST segment change suggestive of ischemia. Comparison with Prior Nuclear Study: No images to compare  Overall Impression:  Normal stress nuclear study.  [name]   Normal study.  Please tell patient.  Marca Ancona 11/05/2011

## 2011-11-05 ENCOUNTER — Other Ambulatory Visit: Payer: Self-pay

## 2011-11-05 ENCOUNTER — Ambulatory Visit (HOSPITAL_COMMUNITY): Payer: 59 | Attending: Cardiology | Admitting: Radiology

## 2011-11-05 DIAGNOSIS — I4891 Unspecified atrial fibrillation: Secondary | ICD-10-CM | POA: Insufficient documentation

## 2011-11-05 DIAGNOSIS — R079 Chest pain, unspecified: Secondary | ICD-10-CM | POA: Insufficient documentation

## 2011-11-06 ENCOUNTER — Telehealth: Payer: Self-pay | Admitting: Cardiology

## 2011-11-06 NOTE — Telephone Encounter (Signed)
Talked with pt about recent testing

## 2011-11-06 NOTE — Telephone Encounter (Signed)
FU Call: Pt returning call to Anne. Please call back.  

## 2011-11-06 NOTE — Progress Notes (Signed)
LMTCB

## 2011-11-06 NOTE — Progress Notes (Signed)
Pt.notified

## 2011-11-11 ENCOUNTER — Telehealth: Payer: Self-pay | Admitting: Cardiology

## 2011-11-11 ENCOUNTER — Ambulatory Visit (INDEPENDENT_AMBULATORY_CARE_PROVIDER_SITE_OTHER): Payer: 59 | Admitting: Cardiology

## 2011-11-11 ENCOUNTER — Encounter: Payer: Self-pay | Admitting: Cardiology

## 2011-11-11 DIAGNOSIS — R0683 Snoring: Secondary | ICD-10-CM

## 2011-11-11 DIAGNOSIS — G4733 Obstructive sleep apnea (adult) (pediatric): Secondary | ICD-10-CM | POA: Insufficient documentation

## 2011-11-11 DIAGNOSIS — I10 Essential (primary) hypertension: Secondary | ICD-10-CM

## 2011-11-11 DIAGNOSIS — I4891 Unspecified atrial fibrillation: Secondary | ICD-10-CM

## 2011-11-11 DIAGNOSIS — R079 Chest pain, unspecified: Secondary | ICD-10-CM

## 2011-11-11 MED ORDER — LISINOPRIL 5 MG PO TABS: 5.0000 mg | ORAL_TABLET | Freq: Every day | ORAL | Status: DC

## 2011-11-11 MED ORDER — DRONEDARONE HCL 400 MG PO TABS: 400.0000 mg | ORAL_TABLET | Freq: Two times a day (BID) | ORAL | Status: DC

## 2011-11-11 MED ORDER — METOPROLOL SUCCINATE ER 25 MG PO TB24: 25.0000 mg | ORAL_TABLET | Freq: Every day | ORAL | Status: DC

## 2011-11-11 NOTE — Patient Instructions (Signed)
Decrease Toprol XL to 25mg  daily. You can take 1/2 of a 50mg  tablet daily.  Start dronedarone 400mg  twice a day WITH FOOD.  Start lisinopril 5mg  daily.  Your physician has recommended that you have a sleep study. This test records several body functions during sleep, including: brain activity, eye movement, oxygen and carbon dioxide blood levels, heart rate and rhythm, breathing rate and rhythm, the flow of air through your mouth and nose, snoring, body muscle movements, and chest and belly movement.  Dr Shirlee Latch has cleared you for leg surgery. You should hold Xarelto 3 days prior to surgery. We will fax this information to Dr Lestine Box.   Your physician recommends that you schedule a follow-up appointment in: 1 month with Dr Shirlee Latch.

## 2011-11-11 NOTE — Telephone Encounter (Signed)
New Problem   Patient was seen in the office earlier today by Dr. Shirlee Latch. Said nurse was suppose to fax clearance to PCP after she left but is has not been received by PCP.  Patient request return call at hm# [phone]

## 2011-11-11 NOTE — Telephone Encounter (Signed)
The surgical clearance  form was faxed to Dr Lestine Box today. I talked with pt. Dr Lestine Box did not have form. I refaxed form and original returned to HIM. Pt is aware I was going to refax surgical clearance form.

## 2011-11-11 NOTE — Assessment & Plan Note (Signed)
Suspect OSA from husband's history.  Will get sleep study.  OSA is strong risk factor for atrial fibrillation.   Followup in 1 month with a BMET and to see me.

## 2011-11-11 NOTE — Assessment & Plan Note (Signed)
Paroxysmal atrial fibrillation.  She is in NSR today.  HR is 48.  She is very symptomatic with awareness of palpitations when she is in atrial fibrillation.   - Start dronedarone 400 mg bid with food.  This has a favorable side effect profile for first choice here.  If she fails this, she would be a candidate for flecainide given recent negative myoview.   - Decrease Toprol XL to 25 mg daily given bradycardia and plan to initiate dronedarone.   - CHADSVASC probably 2 for suspected HTN and gender.

## 2011-11-11 NOTE — Progress Notes (Signed)
PCP: Dr. Collins Scotland  66 yo presents for followup of atrial fibrillation.  This appears to be paroxysmal.  Since I last saw her, she feels like she has been in and out of atrial fibrillation.  She periodically feels her heart racing for several hours; this happens at least daily.  Never as long as the prolonged episode for a number of days when I first saw her in 1/13. She is in NSR with rate 48 today.  Since last appointment, she had a normal myoview and an echo showing EF 65% with mild MR.  She has not been having chest pain or significant exertional dyspnea.  She is very uncomfortable when she is in atrial fibrillation.   Her husband reports that she snores loudly, gasps in her sleep, and stops breathing.  She does not report being particularly fatigued during the day.  Her BP also has been running high at her last few MD visits and is 140/92 today.    She needs ankle surgery and a colonoscopy in the near future.   ECG: NSR at 48  PMH: 1. Atrial fibrillation: first noted in 1/13.  Echo (2/13) with EF 65%, mild MR.   2. Osteoarthritis: Shoulder replacement in 5/09.  3. H/o traumatic c-spine fracture.  4. Endometrial cancer in 3/12.  Hysterectomy.  5. Stress echo in 9/09 was normal, Lexiscan myoview in 2/13 showed no ischemia or infarction.  6. Suspect HTN.  7. Suspect OSA  SH: Lives in [place], married, does not work, no smoking.    FH: CAD, CHF in father; CVA in mother.   ROS: All systems reviewed and negative except as per HPI.   Current Outpatient Prescriptions  Medication Sig Dispense Refill  . Acetaminophen (TYLENOL PO) Take by mouth as needed.      Marland Kitchen ESZOPICLONE 3 MG tablet 0.5 tablets as needed.      . Rivaroxaban (XARELTO) 20 MG TABS Take 20 mg by mouth daily.  30 tablet  11  . DISCONTD: metoprolol succinate (TOPROL XL) 50 MG 24 hr tablet Take 1 tablet (50 mg total) by mouth daily. Take with or immediately following a meal.  30 tablet  11  . dronedarone (MULTAQ) 400 MG tablet  Take 1 tablet (400 mg total) by mouth 2 (two) times daily with a meal.  60 tablet  3  . lisinopril (PRINIVIL,ZESTRIL) 5 MG tablet Take 1 tablet (5 mg total) by mouth daily.  30 tablet  6  . metoprolol succinate (TOPROL XL) 25 MG 24 hr tablet Take 1 tablet (25 mg total) by mouth daily.  30 tablet  6    BP 140/92  Pulse 48  Ht 5\' 3"  (1.6 m)  Wt 191 lb 12.8 oz (87 kg)  BMI 33.98 kg/m2 General: NAD Neck: No JVD, no thyromegaly or thyroid nodule.  Lungs: Clear to auscultation bilaterally with normal respiratory effort. CV: Nondisplaced PMI.  Heart irregular S1/S2, no S3/S4, no murmur.  Trace ankle edema.  No carotid bruit.  Normal pedal pulses.  Abdomen: Soft, nontender, no hepatosplenomegaly, no distention.  Neurologic: Alert and oriented x 3.  Psych: Normal affect. Extremities: No clubbing or cyanosis.

## 2011-11-11 NOTE — Assessment & Plan Note (Signed)
Suspect HTN.  Will add lisinopril 5 mg daily.

## 2011-11-18 ENCOUNTER — Telehealth: Payer: Self-pay | Admitting: Cardiology

## 2011-11-18 NOTE — Telephone Encounter (Signed)
All Usual Cardiac faxed to [facility] @ [phone] 11/18/11/KM

## 2011-11-20 ENCOUNTER — Telehealth: Payer: Self-pay | Admitting: Cardiology

## 2011-11-20 NOTE — Telephone Encounter (Signed)
Pt had surgery yesterday on her ankle and she needs to know when she needs to start taking her meds again

## 2011-11-20 NOTE — Telephone Encounter (Signed)
Pt asking when to restart Xarelto. Per Dr Almon Hercules to restart Xarelto today if OK with surgeon. Pt is aware of Dr Alford Highland recommendation.

## 2011-12-01 ENCOUNTER — Encounter (HOSPITAL_BASED_OUTPATIENT_CLINIC_OR_DEPARTMENT_OTHER): Payer: 59

## 2011-12-02 ENCOUNTER — Encounter: Payer: Self-pay | Admitting: *Deleted

## 2011-12-04 ENCOUNTER — Ambulatory Visit (INDEPENDENT_AMBULATORY_CARE_PROVIDER_SITE_OTHER): Payer: 59 | Admitting: Internal Medicine

## 2011-12-04 ENCOUNTER — Encounter: Payer: Self-pay | Admitting: Internal Medicine

## 2011-12-04 VITALS — BP 128/82 | HR 51 | Ht 63.0 in | Wt 192.4 lb

## 2011-12-04 DIAGNOSIS — R1033 Periumbilical pain: Secondary | ICD-10-CM

## 2011-12-04 DIAGNOSIS — D689 Coagulation defect, unspecified: Secondary | ICD-10-CM

## 2011-12-04 MED ORDER — DICYCLOMINE HCL 10 MG PO CAPS: 10.0000 mg | ORAL_CAPSULE | Freq: Three times a day (TID) | ORAL | Status: DC

## 2011-12-04 MED ORDER — PEG-KCL-NACL-NASULF-NA ASC-C 100 G PO SOLR: 1.0000 | Freq: Once | ORAL | Status: DC

## 2011-12-04 NOTE — Patient Instructions (Addendum)
You have been scheduled for a colonoscopy. Please follow written instructions given to you at your visit today.  Please pick up your prep kit at the pharmacy within the next 1-3 days. Please hold Xarelto 5 days prior to test per Dr Juanda Chance. We have sent the following medications to your pharmacy for you to pick up at your convenience: Bentyl CC: Dr Herb Grays, Dr dalton Shirlee Latch
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
## 2011-12-05 ENCOUNTER — Ambulatory Visit (INDEPENDENT_AMBULATORY_CARE_PROVIDER_SITE_OTHER): Payer: 59 | Admitting: Cardiology

## 2011-12-05 ENCOUNTER — Encounter: Payer: Self-pay | Admitting: Cardiology

## 2011-12-05 VITALS — BP 120/62 | HR 58 | Ht 63.0 in | Wt 193.8 lb

## 2011-12-05 DIAGNOSIS — I4891 Unspecified atrial fibrillation: Secondary | ICD-10-CM

## 2011-12-05 DIAGNOSIS — R109 Unspecified abdominal pain: Secondary | ICD-10-CM | POA: Insufficient documentation

## 2011-12-05 DIAGNOSIS — I10 Essential (primary) hypertension: Secondary | ICD-10-CM

## 2011-12-05 LAB — BASIC METABOLIC PANEL
BUN: 16 mg/dL (ref 6–23)
Calcium: 9.3 mg/dL (ref 8.4–10.5)
Creatinine, Ser: 0.8 mg/dL (ref 0.4–1.2)
GFR: 75.5 mL/min (ref >60.00)
Potassium: 4.6 mEq/L (ref 3.5–5.1)

## 2011-12-05 MED ORDER — METOPROLOL SUCCINATE ER 25 MG PO TB24: ORAL_TABLET | ORAL | Status: DC

## 2011-12-05 MED ORDER — LOSARTAN POTASSIUM 50 MG PO TABS: 50.0000 mg | ORAL_TABLET | Freq: Every day | ORAL | Status: DC

## 2011-12-05 NOTE — Progress Notes (Signed)
Addended by: Laurey Morale on: 12/05/2011 02:17 PM   Modules accepted: Level of Service

## 2011-12-05 NOTE — Assessment & Plan Note (Addendum)
Doing well with no symptomatic recurrences on dronedarone.  HR is running low in the upper 40s at rest today.  No lightheadedness.  - Continue Xarelto and dronedarone (needs to be taken with food).  QT interval not prolonged on dronedarone.  - Decrease Toprol XL to 12.5 mg daily with bradycardia.  - If she has breakthrough atrial fibrillation on dronedarone, would be flecainide candidate (had normal myoview).

## 2011-12-05 NOTE — Assessment & Plan Note (Signed)
Cough with lisinopril.  Stop lisinopril and start losartan 25 mg daily.

## 2011-12-05 NOTE — Progress Notes (Signed)
PCP: Dr. Collins Scotland  66 yo presents for followup of symptomatic paroxysmal atrial fibrillation.  She has been on dronedarone and Xarelto.  She feels good today.  No symptomatic recurrences of atrial fibrillation.  No dyspnea or chest pain.  She has chronic LLQ abdominal pain and has a colonoscopy coming up with Dr. Juanda Chance.  She has had a dry cough since starting lisinopril a number of weeks ago.  She has tolerated Xarelto well with no melena or hematochezia.  I had set her up for a sleep study, but she cancelled this because per her husband, she has not been snoring recently.    ECG: NSR at 46, QTc normal  PMH: 1. Atrial fibrillation: Paroxysmal, first noted in 1/13.  Echo (2/13) with EF 65%, mild MR.   2. Osteoarthritis: Shoulder replacement in 5/09.  3. H/o traumatic c-spine fracture.  4. Endometrial cancer in 3/12.  Hysterectomy.  5. Stress echo in 9/09 was normal, Lexiscan myoview in 2/13 showed no ischemia or infarction.  6. HTN.   SH: Lives in [place], married, does not work, no smoking.    FH: CAD, CHF in father; CVA in mother.   ROS: All systems reviewed and negative except as per HPI.   Current Outpatient Prescriptions  Medication Sig Dispense Refill  . Acetaminophen (TYLENOL PO) Take by mouth as needed.      . dicyclomine (BENTYL) 10 MG capsule Take 1 capsule (10 mg total) by mouth 3 (three) times daily.  90 capsule  1  . dronedarone (MULTAQ) 400 MG tablet Take 1 tablet (400 mg total) by mouth 2 (two) times daily with a meal.  60 tablet  3  . Eszopiclone (ESZOPICLONE) 3 MG TABS Take 3 mg by mouth daily as needed. Take immediately before bedtime      . HYDROcodone-acetaminophen (NORCO) 5-325 MG per tablet Take 1 tablet by mouth as needed.       . peg 3350 powder (MOVIPREP) 100 G SOLR Take 1 kit (100 g total) by mouth once.  1 kit  0  . Rivaroxaban (XARELTO) 20 MG TABS Take 20 mg by mouth daily.  30 tablet  11  . DISCONTD: metoprolol succinate (TOPROL XL) 25 MG 24 hr tablet Take 1  tablet (25 mg total) by mouth daily.  30 tablet  6  . losartan (COZAAR) 50 MG tablet Take 1 tablet (50 mg total) by mouth daily.  30 tablet  6  . metoprolol succinate (TOPROL XL) 25 MG 24 hr tablet 1/2 tablet daily (total 12.5mg  daily)  30 tablet  6  . DISCONTD: metoprolol succinate (TOPROL XL) 25 MG 24 hr tablet 1/2 tablet daily (total 12.5mg  daily)        BP 120/62  Pulse 58  Ht 5\' 3"  (1.6 m)  Wt 193 lb 12.8 oz (87.907 kg)  BMI 34.33 kg/m2 General: NAD Neck: No JVD, no thyromegaly or thyroid nodule.  Lungs: Clear to auscultation bilaterally with normal respiratory effort. CV: Nondisplaced PMI.  Heart irregular S1/S2, no S3/S4, no murmur.  Trace ankle edema.  No carotid bruit.  Normal pedal pulses.  Abdomen: Soft, nontender, no hepatosplenomegaly, no distention.  Neurologic: Alert and oriented x 3.  Psych: Normal affect. Extremities: No clubbing or cyanosis.

## 2011-12-05 NOTE — Patient Instructions (Signed)
Decrease metoprolol succinate (Toprol XL)  to 12.5mg  daily--this will be one-half 25mg  tablet daily.  Stop lisinopril.  Start losartan 25mg  daily.  You can hold Xarelto for 2 days before your surgery.  Your physician recommends that you have lab work today--BMET  427.31  401.9   Your physician wants you to follow-up in: 4 months with Dr Shirlee Latch. (July 2013).  You will receive a reminder letter in the mail two months in advance. If you don't receive a letter, please call our office to schedule the follow-up appointment.

## 2011-12-05 NOTE — Assessment & Plan Note (Signed)
Needs colonoscopy.  Can hold Xarelto 2 days prior to procedure.  If no bleeding complications, restart the next day.

## 2011-12-13 ENCOUNTER — Encounter (HOSPITAL_COMMUNITY): Payer: Self-pay | Admitting: *Deleted

## 2011-12-18 ENCOUNTER — Encounter (HOSPITAL_COMMUNITY)
Admission: RE | Disposition: A | Discharge status: Home / Self Care | Payer: Self-pay | Source: Ambulatory Visit | Attending: Internal Medicine

## 2011-12-18 ENCOUNTER — Ambulatory Visit (HOSPITAL_COMMUNITY)
Admission: RE | Admit: 2011-12-18 | Discharge: 2011-12-18 | Disposition: A | Discharge status: Home / Self Care | Payer: 59 | Source: Ambulatory Visit | Attending: Internal Medicine | Admitting: Internal Medicine

## 2011-12-18 ENCOUNTER — Encounter (HOSPITAL_COMMUNITY): Payer: Self-pay | Admitting: *Deleted

## 2011-12-18 DIAGNOSIS — K648 Other hemorrhoids: Secondary | ICD-10-CM | POA: Insufficient documentation

## 2011-12-18 DIAGNOSIS — R1032 Left lower quadrant pain: Secondary | ICD-10-CM | POA: Insufficient documentation

## 2011-12-18 DIAGNOSIS — Z86010 Personal history of colonic polyps: Secondary | ICD-10-CM

## 2011-12-18 DIAGNOSIS — R109 Unspecified abdominal pain: Secondary | ICD-10-CM

## 2011-12-18 DIAGNOSIS — K5730 Diverticulosis of large intestine without perforation or abscess without bleeding: Secondary | ICD-10-CM | POA: Insufficient documentation

## 2011-12-18 HISTORY — PX: COLONOSCOPY: SHX5424

## 2011-12-18 SURGERY — COLONOSCOPY
Anesthesia: Moderate Sedation

## 2011-12-18 MED ORDER — FENTANYL CITRATE 0.05 MG/ML IJ SOLN
INTRAMUSCULAR | Status: AC
  Filled 2011-12-18: qty 2

## 2011-12-18 MED ORDER — DIPHENHYDRAMINE HCL 50 MG/ML IJ SOLN
INTRAMUSCULAR | Status: AC
  Filled 2011-12-18: qty 1

## 2011-12-18 MED ORDER — MIDAZOLAM HCL 5 MG/5ML IJ SOLN
INTRAMUSCULAR | Status: DC | PRN
  Administered 2011-12-18 (×5): 2 mg via INTRAVENOUS

## 2011-12-18 MED ORDER — FENTANYL NICU IV SYRINGE 50 MCG/ML
INJECTION | INTRAMUSCULAR | Status: DC | PRN
Start: 2011-12-18 — End: 2011-12-18
  Administered 2011-12-18 (×4): 25 ug via INTRAVENOUS

## 2011-12-18 MED ORDER — MIDAZOLAM HCL 10 MG/2ML IJ SOLN
INTRAMUSCULAR | Status: AC
  Filled 2011-12-18: qty 2

## 2011-12-18 MED ORDER — SODIUM CHLORIDE 0.9 % IV SOLN
Freq: Once | INTRAVENOUS | Status: AC
  Administered 2011-12-18: 09:00:00 via INTRAVENOUS

## 2011-12-18 NOTE — Discharge Instructions (Addendum)
May continue Bentyl 10mg  or 20 mg ( one or 2 pills) three times a day for colon pain.  Add Metamucil 1 tsp daily for Diverticulosis.  Colonoscopy Care After Read the instructions outlined below and refer to this sheet in the next few weeks. These discharge instructions provide you with general information on caring for yourself after you leave the hospital. Your doctor may also give you specific instructions. While your treatment has been planned according to the most current medical practices available, unavoidable complications occasionally occur. If you have any problems or questions after discharge, call your doctor. HOME CARE INSTRUCTIONS ACTIVITY:  You may resume your regular activity, but move at a slower pace for the next 24 hours.   Take frequent rest periods for the next 24 hours.   Walking will help get rid of the air and reduce the bloated feeling in your belly (abdomen).   No driving for 24 hours (because of the medicine (anesthesia) used during the test).   You may shower.   Do not sign any important legal documents or operate any machinery for 24 hours (because of the anesthesia used during the test).  NUTRITION:  Drink plenty of fluids.   You may resume your normal diet as instructed by your doctor.   Begin with a light meal and progress to your normal diet. Heavy or fried foods are harder to digest and may make you feel sick to your stomach (nauseated).   Avoid alcoholic beverages for 24 hours or as instructed.  MEDICATIONS:  You may resume your normal medications unless your doctor tells you otherwise.  WHAT TO EXPECT TODAY:  Some feelings of bloating in the abdomen.   Passage of more gas than usual.   Spotting of blood in your stool or on the toilet paper.  IF YOU HAD POLYPS REMOVED DURING THE COLONOSCOPY:  No aspirin products for 7 days or as instructed.   No alcohol for 7 days or as instructed.   Eat a soft diet for the next 24 hours.  FINDING OUT  THE RESULTS OF YOUR TEST Not all test results are available during your visit. If your test results are not back during the visit, make an appointment with your caregiver to find out the results. Do not assume everything is normal if you have not heard from your caregiver or the medical facility. It is important for you to follow up on all of your test results.  SEEK IMMEDIATE MEDICAL CARE IF:  You have more than a spotting of blood in your stool.   Your belly is swollen (abdominal distention).   You are nauseated or vomiting.   You have a fever.   You have abdominal pain or discomfort that is severe or gets worse throughout the day.    Diverticulosis Diverticulosis is a common condition that develops when small pouches (diverticula) form in the wall of the colon. The risk of diverticulosis increases with age. It happens more often in people who eat a low-fiber diet. Most individuals with diverticulosis have no symptoms. Those individuals with symptoms usually experience abdominal pain, constipation, or loose stools (diarrhea). HOME CARE INSTRUCTIONS   Increase the amount of fiber in your diet as directed by your caregiver or dietician. This may reduce symptoms of diverticulosis.   Your caregiver may recommend taking a dietary fiber supplement.   Drink at least 6 to 8 glasses of water each day to prevent constipation.   Try not to strain when you have a bowel  movement.   Your caregiver may recommend avoiding nuts and seeds to prevent complications, although this is still an uncertain benefit.   Only take over-the-counter or prescription medicines for pain, discomfort, or fever as directed by your caregiver.  FOODS WITH HIGH FIBER CONTENT INCLUDE:  Fruits. Apple, peach, pear, tangerine, raisins, prunes.   Vegetables. Brussels sprouts, asparagus, broccoli, cabbage, carrot, cauliflower, romaine lettuce, spinach, summer squash, tomato, winter squash, zucchini.   Starchy Vegetables.  Baked beans, kidney beans, lima beans, split peas, lentils, potatoes (with skin).   Grains. Whole wheat bread, brown rice, bran flake cereal, plain oatmeal, white rice, shredded wheat, bran muffins.  SEEK IMMEDIATE MEDICAL CARE IF:   You develop increasing pain or severe bloating.   You have an oral temperature above 102 F (38.9 C), not controlled by medicine.   You develop vomiting or bowel movements that are bloody or black.

## 2011-12-18 NOTE — Interval H&P Note (Signed)
History and Physical Interval Note:  12/18/2011 9:24 AM  [name]  has presented today for surgery, with the diagnosis of abdominal pain  The various methods of treatment have been discussed with the patient and family. After consideration of risks, benefits and other options for treatment, the patient has consented to  Procedure(s) (LRB): COLONOSCOPY (N/A) as a surgical intervention .  The patients' history has been reviewed, patient examined, no change in status, stable for surgery.  I have reviewed the patients' chart and labs.  Questions were answered to the patient's satisfaction.     Lina Sar

## 2011-12-18 NOTE — Op Note (Signed)
[facility] [address] [place], [place]  [address]  COLONOSCOPY PROCEDURE REPORT  PATIENT:  [name], [name]  MR#:  [id number] BIRTHDATE:  [birth year], 65 yrs. old  GENDER:  female ENDOSCOPIST:  Hedwig Morton. Juanda Chance, MD REF. BY:  Herb Grays, M.D. PROCEDURE DATE:  12/18/2011 PROCEDURE:  Colonoscopy with biopsy ASA CLASS:  Class II INDICATIONS:  Abdominal pain LLQ abd. pain,, CT scan shows DJD LS spine, tubular adenoma on prior c olonoscopy pain imorived on Bentyl 10 tid MEDICATIONS:   These medications were titrated to patient response per physician's verbal order, Versed 10 mg, Fentanyl 100 mcg  DESCRIPTION OF PROCEDURE:   After the risks and benefits and of the procedure were explained, informed consent was obtained. Digital rectal exam was performed and revealed no rectal masses. The Pentax Ped Colon [id number] endoscope was introduced through the anus and advanced to the cecum, which was identified by both the appendix and ileocecal valve.  The quality of the prep was excellent, using MoviPrep.  The instrument was then slowly withdrawn as the colon was fully examined. <<PROCEDUREIMAGES>>  FINDINGS:  Mild diverticulosis was found in the sigmoid colon (see image1 and image2). slightly thickened sigmoid colon, sharp turne at the pelvic rim, no obstruction  Internal Hemorrhoids were found (see image8).  This was otherwise a normal examination of the colon. With standard forceps, biopsy was obtained and sent to pathology (see image6, image5, image4, and image3). thickened fols at 40 cm initially thought to represent a sessile polyp but I think it was a normal variation of a fold- biopsies taken Retroflexed views in the rectum revealed no abnormalities.    The scope was then withdrawn from the patient and the procedure completed.  COMPLICATIONS:  None ENDOSCOPIC IMPRESSION: 1) Mild diverticulosis in the sigmoid colon 2) Internal hemorrhoids 3) Otherwise normal  examination RECOMMENDATIONS: 1) Await biopsy results continue Bentyl 10 mg tid, may even increase to 20 mg as needed  add Metamucil 1 tsp daily, I think her pain is due to IBS RESUME XERALTO TODAY  REPEAT EXAM:  In 10 year(s) for.  ______________________________ Hedwig Morton. Juanda Chance, MD  CC:  Laurey Morale, MD  n. Rosalie DoctorHedwig Morton. Aedin Jeansonne at 12/18/2011 09:56 AM  Jacalyn Lefevre, [phone]
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
## 2011-12-19 ENCOUNTER — Encounter (HOSPITAL_COMMUNITY): Payer: Self-pay

## 2011-12-19 ENCOUNTER — Encounter (HOSPITAL_COMMUNITY): Payer: Self-pay | Admitting: Internal Medicine

## 2011-12-19 ENCOUNTER — Encounter: Payer: Self-pay | Admitting: Internal Medicine

## 2011-12-25 ENCOUNTER — Encounter: Payer: 59 | Admitting: Internal Medicine

## 2012-01-07 ENCOUNTER — Other Ambulatory Visit: Payer: Self-pay | Admitting: Obstetrics and Gynecology

## 2012-03-17 ENCOUNTER — Other Ambulatory Visit: Payer: Self-pay | Admitting: Cardiology

## 2012-03-31 ENCOUNTER — Telehealth: Payer: Self-pay | Admitting: Cardiology

## 2012-03-31 NOTE — Telephone Encounter (Signed)
Spoke with pt. Pt is having an injection in neck by Dr Venita Lick at [facility]. [facility] Dr Shon Baton is requesting pt hold Xarelto prior to procedure. I will forward to Dr Shirlee Latch for review and recommendations.

## 2012-03-31 NOTE — Telephone Encounter (Signed)
Normal creatinine, can hold for 2 days before procedure and day of.

## 2012-03-31 NOTE — Telephone Encounter (Signed)
LMTCB for pt 

## 2012-03-31 NOTE — Telephone Encounter (Signed)
Spoke with pt and she is aware of Dr Alford Highland recommendations. This information has been forwarded to Dr Shon Baton.

## 2012-03-31 NOTE — Telephone Encounter (Signed)
New Problem:    Patient called in wanting you to fax a letter saying that she is ok to stop her blood thinner for a few days prior to her future procedure to Dr. Shon Baton office.  Please call back.

## 2012-03-31 NOTE — Telephone Encounter (Signed)
Patient returning nurse call, she can be reached at [phone].

## 2012-04-06 ENCOUNTER — Encounter: Payer: Self-pay | Admitting: Cardiology

## 2012-04-06 ENCOUNTER — Ambulatory Visit (INDEPENDENT_AMBULATORY_CARE_PROVIDER_SITE_OTHER): Payer: Medicare Other | Admitting: Cardiology

## 2012-04-06 VITALS — BP 113/68 | HR 48 | Ht 63.0 in | Wt 203.0 lb

## 2012-04-06 DIAGNOSIS — I10 Essential (primary) hypertension: Secondary | ICD-10-CM

## 2012-04-06 DIAGNOSIS — I4891 Unspecified atrial fibrillation: Secondary | ICD-10-CM

## 2012-04-06 NOTE — Assessment & Plan Note (Signed)
BP well controlled.

## 2012-04-06 NOTE — Patient Instructions (Addendum)
Your physician recommends that you return for lab work on Tuesday October 15,2013. The lab opens at 8:30am.  Your physician wants you to follow-up in: 6 months with Dr Shirlee Latch. (January 2014). You will receive a reminder letter in the mail two months in advance. If you don't receive a letter, please call our office to schedule the follow-up appointment.

## 2012-04-06 NOTE — Assessment & Plan Note (Addendum)
Doing well, in NSR.  Heart rate remains mildly bradycardic.   - Continue Xarelto and dronedarone (needs to be taken with food).  QT interval not prolonged on dronedarone.  - Continue Toprol XL 12.5 mg daily.   - If she has frequent breakthrough atrial fibrillation on dronedarone, would be flecainide candidate (had normal myoview).  - CBC and BMET in 3 months, f/u in 6 months.
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
## 2012-04-08 ENCOUNTER — Telehealth: Payer: Self-pay | Admitting: Cardiology

## 2012-04-08 NOTE — Telephone Encounter (Signed)
[facility] called and xarelto is approved.Patient was called and told.

## 2012-04-08 NOTE — Telephone Encounter (Signed)
New msg Pt wants to talk to you about meds

## 2012-04-08 NOTE — Telephone Encounter (Signed)
Patient called stated she just changed insurances to [facility] and they need prior authorization on xarelto.

## 2012-05-11 ENCOUNTER — Telehealth: Payer: Self-pay | Admitting: Cardiology

## 2012-05-11 NOTE — Telephone Encounter (Signed)
Spoke with pt. She is aware OK to hold Xarelto for 2 days. I will forward this to Dr Shon Baton.

## 2012-05-11 NOTE — Telephone Encounter (Signed)
OK to hold 2 days, restart afterwards.

## 2012-05-11 NOTE — Telephone Encounter (Signed)
Spoke with pt. Pt is to be  scheduled for injection in neck Dr Shon Baton. Dr Shon Baton requesting pt hold Xarelto for 2 days for injection. I will forward to Dr Shirlee Latch for review and recommendations.

## 2012-05-11 NOTE — Telephone Encounter (Signed)
Pt Calling re dr Shon Baton wants to do another injection in her neck, needs to come off xalerto, needs ok faxed with how many days too  fax [phone]

## 2012-05-27 ENCOUNTER — Telehealth: Payer: Self-pay | Admitting: Cardiology

## 2012-05-27 NOTE — Telephone Encounter (Signed)
Spoke with pt. She is aware Dr Shirlee Latch completed surgical clearance form with his recommendations. I given completed  form to HIM to be faxed to [facility].

## 2012-05-27 NOTE — Telephone Encounter (Signed)
Pt brought paper from [facility] in yesterday to have dr Shirlee Latch fill out, their office just called pt to let her know they haven't received it , pt checking on status, pls call [phone] or [phone]

## 2012-05-28 ENCOUNTER — Telehealth: Payer: Self-pay | Admitting: Cardiology

## 2012-05-28 NOTE — Telephone Encounter (Signed)
Pt calls today with 1.   blood in the urine this morning and yesterday morning.  When she had a bowel movement she examined it and found no blood. Only when she urinated.       Denies any flank pain,fever, or burning upon urination. 2.   woke up " the other morning with blood dried blood on my shirt".  Denies any dried blood within the nares. Pt thinks she may have drooled the blood. 3.  She has also been experiencing blood when she brushes her teeth with a soft toothbrush. 4.  She also states waking up during the night with a lot of leg cramps.  All of the above have occurred this week.   She talked with Dr. Alford Highland nurse yesterday but did not mention any of this. Since it occurred again this morning she is quite concerned. Reassurance given I will forward this to [facility] & Dr. Crista Curb RN

## 2012-05-28 NOTE — Telephone Encounter (Signed)
Needs to see Dr. Collins Scotland.  Xarelto itself will not cause hematuria, there will be an underlying cause => needs to have UTI ruled out.  Also consider kidney stones or bladder tumor.

## 2012-05-28 NOTE — Telephone Encounter (Signed)
Pt having bloody stool this morning, woke up with this morning with blood on her shirt as well.  Pt currently taking Xarelto.     Xfer to Triage

## 2012-05-28 NOTE — Telephone Encounter (Signed)
Spoke with pt. Pt has an appt with Dr Collins Scotland this afternoon at [facility].

## 2012-06-02 ENCOUNTER — Other Ambulatory Visit: Payer: Self-pay | Admitting: Family Medicine

## 2012-06-02 DIAGNOSIS — Z1231 Encounter for screening mammogram for malignant neoplasm of breast: Secondary | ICD-10-CM

## 2012-06-04 ENCOUNTER — Encounter: Payer: Self-pay | Admitting: Gynecologic Oncology

## 2012-06-04 ENCOUNTER — Other Ambulatory Visit (HOSPITAL_COMMUNITY)
Admission: RE | Admit: 2012-06-04 | Discharge: 2012-06-04 | Disposition: A | Discharge status: Home / Self Care | Payer: Medicare Other | Source: Ambulatory Visit | Attending: Gynecologic Oncology | Admitting: Gynecologic Oncology

## 2012-06-04 ENCOUNTER — Ambulatory Visit: Payer: Medicare Other | Attending: Gynecologic Oncology | Admitting: Gynecologic Oncology

## 2012-06-04 VITALS — BP 130/64 | HR 68 | Temp 97.9°F | Resp 16 | Ht 64.0 in | Wt 208.3 lb

## 2012-06-04 DIAGNOSIS — C541 Malignant neoplasm of endometrium: Secondary | ICD-10-CM

## 2012-06-04 DIAGNOSIS — C549 Malignant neoplasm of corpus uteri, unspecified: Secondary | ICD-10-CM | POA: Insufficient documentation

## 2012-06-04 DIAGNOSIS — Z01419 Encounter for gynecological examination (general) (routine) without abnormal findings: Secondary | ICD-10-CM | POA: Insufficient documentation

## 2012-06-04 NOTE — Patient Instructions (Signed)
Turned to clinic to see you in one year and Dr. Zara Chess in 6 months.
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
## 2012-06-05 ENCOUNTER — Encounter (HOSPITAL_COMMUNITY): Payer: Self-pay | Admitting: Pharmacist

## 2012-06-05 DIAGNOSIS — M47812 Spondylosis without myelopathy or radiculopathy, cervical region: Secondary | ICD-10-CM | POA: Diagnosis present

## 2012-06-05 NOTE — H&P (Signed)
[name] 06/05/2012 9:08 AM [facility]: [facility] Patient #: [id number] DOB: 02-01-1946 Married / Language: [place] / Race: White Female   History of Present Illness(Diamonte Stavely Dierdre Highman, PA-C; 06/05/2012 9:32 AM) The patient is a 66 year old female who comes in today for a preoperative History and Physical. The patient is scheduled for a ACDF C3-4 (for cervical facet syndrome with posterior neck and right upper trapezial pain) to be performed by Dr. Debria Garret D. Shon Baton, MD at [facility] on Wednesday, June 17, 2012 at [facility] . Please see the hospital record for complete dictated history and physical.    Allergies(Sharon Gillian Shields; 06/05/2012 9:08 AM) MORPHINE. 10/04/2004 SULFA drugs. 10/28/1997 CODEINE. 10/28/1997   Family History(Aurea Aronov J Kenyetta Fife, PA-C; 06/05/2012 10:30 AM) Cancer. sister, sister and child Cerebrovascular Accident. mother Congestive Heart Failure. father, mother and father Diabetes Mellitus. mother, sister and brother Heart Disease. father Hypertension. sister and brother, mother and child Rheumatoid Arthritis. mother, father and sister, mother and father   Social History(Usama Harkless Dierdre Highman, PA-C; 06/05/2012 10:30 AM) Tobacco use. never smoker Children. 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 no Exercise. Exercises daily; does running / walking Exercises weekly; does running / walking Illicit drug use. no Living situation. live with spouse Marital status. married Number of flights of stairs before winded. 4-5 2-3 Pain Contract. no Tobacco / smoke exposure. yes outdoors only   Medication History(Ninetta Adelstein J Gillian Kluever, PA-C; 06/05/2012 9:38 AM) Coral Calcium (1000 (390 Ca)MG Tablet, 2 Oral daily) Active. Vitamin C (500MG  Tablet, 1 Oral daily) Active. Lunesta (3MG  Tablet, 1/2 tablet Oral qhs) Active. Multaq (400MG  Tablet, Oral two times daily)  Active. Xarelto (20MG  Tablet, 1 Oral daily) Active. Toprol XL (25MG  Tablet ER 24HR, 1/2 Oral daily) Active. Losartan Potassium (50MG  Tablet, 1 Oral daily) Active. Norco (5-325MG  Tablet, 1 Oral qhs) Active. (prn pain) Tylenol (500MG  Capsule, 1 Oral q 6 hours) Active.   Past Surgical History([place] [facility], PA-C; 06/05/2012 10:30 AM) Ankle Surgery. right Arthroscopy of Knee. bilateral Hysterectomy. complete (cancerous) Rotator Cuff Repair. bilateral Tubal Ligation   Other Problems(Allysson Rinehimer J Yonatan Guitron, PA-C; 06/05/2012 10:30 AM) Cancer Osteoarthritis Cardiac Arrhythmia High blood pressure   Review of Systems(Larance Ratledge J Azaryah Heathcock, PA-C; 06/05/2012 10:30 AM) General:Not Present- Chills, Fever, Night Sweats, Appetite Loss, Fatigue, Feeling sick, Weight Gain and Weight Loss. Skin:Not Present- Itching, Rash, Skin Color Changes, Ulcer, Psoriasis and Change in Hair or Nails. HEENT:Not Present- Sensitivity to light, Hearing problems, Nose Bleed and Ringing in the Ears. Neck:Not Present- Swollen Glands and Neck Mass. Respiratory:Not Present- Snoring, Chronic Cough, Bloody sputum and Dyspnea. Cardiovascular:Not Present- Shortness of Breath, Chest Pain, Swelling of Extremities, Leg Cramps and Palpitations. Gastrointestinal:Not Present- Bloody Stool, Heartburn, Abdominal Pain, Vomiting, Nausea and Incontinence of Stool. Female Genitourinary:Not Present- Blood in Urine, Menstrual Irregularities, Frequency, Incontinence and Nocturia. Musculoskeletal:Present- Back Pain. Not Present- Muscle Weakness, Muscle Pain, Joint Stiffness, Joint Swelling and Joint Pain. Neurological:Not Present- Tingling, Numbness, Burning, Tremor, Headaches and Dizziness. Psychiatric:Not Present- Anxiety, Depression and Memory Loss. Endocrine:Not Present- Cold Intolerance, Heat Intolerance, Excessive hunger and Excessive Thirst. Hematology:Not Present- Abnormal Bleeding, Anemia, Blood Clots and Easy  Bruising.   Vitals(Sharon Gillian Shields; 06/05/2012 9:13 AM) 06/05/2012 9:11 AM Weight: 184 lb Height: 63 in Body Surface Area: 1.93 m Body Mass Index: 32.59 kg/m BP: 133/88 (Sitting, Left Arm, Standard)    Physical Exam(Madaline Lefeber J Rondell Frick, PA-C; 06/05/2012 10:10 AM) The physical exam findings are as follows:   General General Appearance- pleasant. Not in acute  distress. Orientation- Oriented X3. Build & Nutrition- Well nourished and Well developed. Posture- Normal posture. Gait- Normal. Mental Status- Alert.   Integumentary General Characteristics:Surgical Scars- no surgical scar evidence of previous cervical surgery. Cervical Spine- Skin examination of the cervical spine is without deformity, skin lesions, lacerations or abrasions.   Head and Neck Neck Global Assessment- supple. no lymphadenopathy and no nucchal rigidty.   Eye Pupil- Bilateral- Normal, Direct reaction to light normal, Equal and Regular. Motion- Bilateral- EOMI.   Chest and Lung Exam Auscultation: Breath sounds:- Clear.   Cardiovascular Auscultation:Rhythm- Regular rate and rhythm. Heart Sounds- Normal heart sounds.   Abdomen Palpation/Percussion:Palpation and Percussion of the abdomen reveal - Non Tender, No Rebound tenderness and Soft.   Peripheral Vascular Upper Extremity: Palpation:Radial pulse- Bilateral- 2+.   Neurologic Sensation:Upper Extremity- Bilateral- sensation is intact in the upper extremity. Reflexes:Biceps Reflex- Bilateral- 2+. Brachioradialis Reflex- Bilateral- 2+. Triceps Reflex- Bilateral- 2+. Babinski- Bilateral- Babinski not present. Clonus- Bilateral- clonus not present. Hoffman's Sign- Bilateral- Hoffman's sign not present.   Musculoskeletal Spine/Ribs/Pelvis Cervical Spine : Inspection and Palpation:Tenderness- no soft tissue tenderness to palpation and no bony tenderness to palpation. bony/soft tissue  palpation of the cervical spine and shoulders does not recreate their typical pain. Strength and Tone: Strength:Deltoid- Bilateral- 5/5. Biceps- Bilateral- 5/5. Triceps- Bilateral- 5/5. Wrist Extension- Bilateral- 5/5. Hand Grip- Bilateral- 5/5. Heel walk- Bilateral- able to heel walk without difficulty. Toe Walk- Bilateral- able to walk on toes without difficulty. Heel-Toe Walk- Bilateral- able to heel-toe walk without difficulty. ROM- Flexion- Mildly decreased and painful. Extension- Mildly decreased and painful. Left Rotation - Mildly decreased and painful. Right Rotation - Mildly decreased and painful. Pain:- neither flexion or extension is more painful than the other. Special Testing- axial compression test negative and cross chest impingement test negative. Non-Anatomic Signs- No non-anatomic signs present. Upper Extremity Range of Motion:- No truesholder pain with IR/ER of the shoulders.   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 Risks/benefits/alternatives to the procedure/expectations following the procedure have been reviewed with the patient by Dr. Shon Baton. The goal of surgery is to reduce not eliminate her pain. She understands and agrees.  MRI of the cervical spine dated 02/2012 demonstrates severe right facet DJD with a small central extrusion and moderate right foraminal stenosis with compression of the C4 nerve root. Please see the official report for the remaining specifics.   She has not yet completed her pre-op and is scheduled to do so on the  19th at 8AM. She is going to be fitted for the ASPEN collar later this morning. She knows to bring this with her the morning of surgery. She has been medically cleared by both her cardiologist and her PCP. Please see the scanned documentation in the patients office chart.  All of her questions have been encouraged, addressed and answered. Plan, at this time is to proceed with surgery as scheduled.   Signed electronically by Gwinda Maine, PA-C (06/05/2012 10:31 AM)   [name] 05/26/2012 9:34 AM [facility] Patient #: [id number] DOB: 10/14/1945 Married / Language: [place] / Race: White Female   History of Present Illness(Sharon Gillian Shields; 05/26/2012 9:36 AM)  The patient is a 66 year old female who presents today for follow up of their neck. The patient is being followed for their neck pain. The patient feels that they are doing poorly. The following medication has been used for pain control: Hydrocodone. Note for "Follow-up Neck": Discuss surgery    Subjective Transcription(DAHARI D BROOKS, MD; 06/05/2012 10:05 AM)  At this point in time after having the subsequent facet injection she continues to feel poorly overall. She states the injections are helpful, in the most part they are very brief. At this point in time she wishes to discuss surgical intervention.      Allergies(Sharon Gillian Shields; 05/26/2012 9:36 AM) MORPHINE. 10/04/2004 SULFA drugs. 10/28/1997 CODEINE. 10/28/1997   Social History(Sharon J Roxan Hockey; 05/26/2012 9:36 AM) Tobacco use. never smoker   Medication History(Sharon Gillian Shields; 05/26/2012 9:36 AM) Awanda Mink (5-325MG  Tablet, 1 Oral q 8 hours prn pain, Taken starting 03/17/2012) Active. Bentyl (10MG  Capsule, Oral three times daily) Active. Xarelto (20MG  Tablet, Oral daily) Active. Multaq (400MG  Tablet, Oral two times daily) Active. Toprol XL (25MG  Tablet ER 24HR, 1/2 Oral daily) Active. Losartan Potassium (50MG  Tablet,  Oral daily) Active. Lunesta (3MG  Tablet, Oral) Active.   Plans Transcription(DAHARI Sheela Stack, MD; 06/05/2012 10:05 AM)  We will proceed with ACDF at C3-4 for axillary neck pain. I have reviewed the risks which include infection, bleeding, nerve damage, death, stroke, paralysis, failure to improve, ongoing or worse pain, loss of bowel and bladder control, need for further surgery, nonunion, throat pain, swallowing difficulties, and hoarseness in the voice. At this point in time my hope is given the positive response to the injections that she will do well with surgery and her overall quality of life will improve.      Miscellaneous Transcription(DAHARI Sheela Stack, MD; 06/05/2012 10:05 AM)  Alvy Beal, MD/srd    T: 06/05/12  D: 06/04/12      Signed electronically by Alvy Beal, MD (05/29/2012 1:30 PM)

## 2012-06-08 NOTE — Pre-Procedure Instructions (Signed)
20 [name]  06/08/2012   Your procedure is scheduled on:  Wednesday, September 25th  Report to [facility] Short Stay Center at 1200 PM.  Call this number if you have problems the morning of surgery: [phone]   Remember:   Do not eat food or drink:After Midnight.  Take these medicines the morning of surgery with A SIP OF WATER: multaq, hydrocodone if needed, toprol   Do not wear jewelry, make-up or nail polish.  Do not wear lotions, powders, or perfumes.   Do not shave 48 hours prior to surgery. Men may shave face and neck.  Do not bring valuables to the hospital.  Contacts, dentures or bridgework may not be worn into surgery.  Leave suitcase in the car. After surgery it may be brought to your room.  For patients admitted to the hospital, checkout time is 11:00 AM the day of discharge.   Patients discharged the day of surgery will not be allowed to drive home.  Special Instructions: CHG Shower Use Special Wash: 1/2 bottle night before surgery and 1/2 bottle morning of surgery.   Please read over the following fact sheets that you were given: Pain Booklet, Coughing and Deep Breathing, MRSA Information and Surgical Site Infection Prevention

## 2012-06-09 ENCOUNTER — Encounter (HOSPITAL_COMMUNITY)
Admission: RE | Admit: 2012-06-09 | Discharge: 2012-06-09 | Disposition: A | Discharge status: Home / Self Care | Payer: Medicare Other | Source: Ambulatory Visit | Attending: Physician Assistant | Admitting: Physician Assistant

## 2012-06-09 ENCOUNTER — Encounter (HOSPITAL_COMMUNITY): Payer: Self-pay

## 2012-06-09 ENCOUNTER — Encounter (HOSPITAL_COMMUNITY)
Admission: RE | Admit: 2012-06-09 | Discharge: 2012-06-09 | Disposition: A | Discharge status: Home / Self Care | Payer: Medicare Other | Source: Ambulatory Visit | Attending: Orthopedic Surgery | Admitting: Orthopedic Surgery

## 2012-06-09 LAB — BASIC METABOLIC PANEL
BUN: 15 mg/dL (ref 6–23)
Calcium: 9.9 mg/dL (ref 8.4–10.5)
Creatinine, Ser: 0.72 mg/dL (ref 0.50–1.10)
GFR calc Af Amer: >90 mL/min (ref >90)
GFR calc non Af Amer: 88 mL/min — ABNORMAL LOW (ref >90)

## 2012-06-09 LAB — SURGICAL PCR SCREEN: MRSA, PCR: NEGATIVE

## 2012-06-09 LAB — CBC
MCHC: 34 g/dL (ref 30.0–36.0)
RDW: 13.6 % (ref 11.5–15.5)

## 2012-06-09 NOTE — Progress Notes (Signed)
[id number]  REQUESTED COPY OF CXR FROM DR. TAMMY SPEARS.... PT HAS HAD A H/O OF A-FIB WHICH IS NOW UNDER CONTROL WITH MEDS...(DR [facility] IS CARDIO--NOTES IN EPIC.Marland KitchenMarland KitchenDENIES ANY HEART PALPITATIONS OR IRREGULARITY....da

## 2012-06-10 ENCOUNTER — Telehealth: Payer: Self-pay | Admitting: Gynecologic Oncology

## 2012-06-10 NOTE — Progress Notes (Signed)
Spoke to [facility] state they will fax result of CXR over

## 2012-06-10 NOTE — Telephone Encounter (Signed)
Pt notified about pap results: negative.  No questions or concerns voiced. 

## 2012-06-11 ENCOUNTER — Other Ambulatory Visit (HOSPITAL_COMMUNITY): Payer: Medicare Other

## 2012-06-12 NOTE — Progress Notes (Signed)
CXR still not received.  Dr. Dewain Penning office [phone]). Receptionist, Sonoita Nation, will fax records again.//L. LoveRN

## 2012-06-16 MED ORDER — CEFAZOLIN SODIUM-DEXTROSE 2-3 GM-% IV SOLR
2.0000 g | INTRAVENOUS | Status: AC
  Administered 2012-06-17: 2 g via INTRAVENOUS
  Filled 2012-06-16: qty 50

## 2012-06-16 NOTE — Progress Notes (Signed)
Spoke w/ receptionist, Casimer Bilis [phone]), will fax copy of CXR, again.

## 2012-06-17 ENCOUNTER — Ambulatory Visit (HOSPITAL_COMMUNITY): Payer: Medicare Other

## 2012-06-17 ENCOUNTER — Encounter (HOSPITAL_COMMUNITY): Payer: Self-pay

## 2012-06-17 ENCOUNTER — Ambulatory Visit (HOSPITAL_COMMUNITY)
Admission: RE | Admit: 2012-06-17 | Discharge: 2012-06-18 | Disposition: A | Discharge status: Home / Self Care | Payer: Medicare Other | Source: Ambulatory Visit | Attending: Orthopedic Surgery | Admitting: Orthopedic Surgery

## 2012-06-17 ENCOUNTER — Encounter (HOSPITAL_COMMUNITY)
Admission: RE | Disposition: A | Discharge status: Home / Self Care | Payer: Self-pay | Source: Ambulatory Visit | Attending: Orthopedic Surgery

## 2012-06-17 ENCOUNTER — Encounter (HOSPITAL_COMMUNITY): Payer: Self-pay | Admitting: Vascular Surgery

## 2012-06-17 ENCOUNTER — Ambulatory Visit (HOSPITAL_COMMUNITY): Payer: Medicare Other | Admitting: Vascular Surgery

## 2012-06-17 ENCOUNTER — Encounter (HOSPITAL_COMMUNITY): Payer: Self-pay | Admitting: *Deleted

## 2012-06-17 DIAGNOSIS — Z01812 Encounter for preprocedural laboratory examination: Secondary | ICD-10-CM | POA: Insufficient documentation

## 2012-06-17 DIAGNOSIS — I10 Essential (primary) hypertension: Secondary | ICD-10-CM | POA: Insufficient documentation

## 2012-06-17 DIAGNOSIS — M542 Cervicalgia: Secondary | ICD-10-CM | POA: Insufficient documentation

## 2012-06-17 DIAGNOSIS — M47812 Spondylosis without myelopathy or radiculopathy, cervical region: Secondary | ICD-10-CM | POA: Diagnosis present

## 2012-06-17 DIAGNOSIS — M5380 Other specified dorsopathies, site unspecified: Secondary | ICD-10-CM | POA: Insufficient documentation

## 2012-06-17 DIAGNOSIS — I4891 Unspecified atrial fibrillation: Secondary | ICD-10-CM

## 2012-06-17 DIAGNOSIS — M5030 Other cervical disc degeneration, unspecified cervical region: Secondary | ICD-10-CM | POA: Insufficient documentation

## 2012-06-17 HISTORY — PX: ANTERIOR CERVICAL DECOMP/DISCECTOMY FUSION: SHX1161

## 2012-06-17 SURGERY — ANTERIOR CERVICAL DECOMPRESSION/DISCECTOMY FUSION 1 LEVEL
Anesthesia: General | Site: Neck | Wound class: Clean

## 2012-06-17 MED ORDER — METHOCARBAMOL 100 MG/ML IJ SOLN
500.0000 mg | Freq: Four times a day (QID) | INTRAMUSCULAR | Status: DC | PRN
  Administered 2012-06-17: 500 mg via INTRAVENOUS
  Filled 2012-06-17: qty 5

## 2012-06-17 MED ORDER — THROMBIN 20000 UNITS EX SOLR
OROMUCOSAL | Status: DC | PRN
  Administered 2012-06-17: 16:00:00 via TOPICAL

## 2012-06-17 MED ORDER — BUPIVACAINE-EPINEPHRINE 0.25% -1:200000 IJ SOLN
INTRAMUSCULAR | Status: DC | PRN
  Administered 2012-06-17: 4 mL

## 2012-06-17 MED ORDER — METOPROLOL SUCCINATE 12.5 MG HALF TABLET
12.5000 mg | ORAL_TABLET | Freq: Every day | ORAL | Status: DC
  Administered 2012-06-18: 12.5 mg via ORAL
  Filled 2012-06-17: qty 1

## 2012-06-17 MED ORDER — DEXAMETHASONE SODIUM PHOSPHATE 4 MG/ML IJ SOLN
4.0000 mg | Freq: Four times a day (QID) | INTRAMUSCULAR | Status: DC
  Filled 2012-06-17 (×7): qty 1

## 2012-06-17 MED ORDER — 0.9 % SODIUM CHLORIDE (POUR BTL) OPTIME
TOPICAL | Status: DC | PRN
  Administered 2012-06-17: 1000 mL

## 2012-06-17 MED ORDER — DEXAMETHASONE SODIUM PHOSPHATE 10 MG/ML IJ SOLN
10.0000 mg | Freq: Once | INTRAMUSCULAR | Status: AC
  Administered 2012-06-17: 10 mg via INTRAVENOUS
  Filled 2012-06-17: qty 1

## 2012-06-17 MED ORDER — SODIUM CHLORIDE 0.9 % IJ SOLN: 3.0000 mL | INTRAMUSCULAR | Status: DC | PRN

## 2012-06-17 MED ORDER — MENTHOL 3 MG MT LOZG: 1.0000 | LOZENGE | OROMUCOSAL | Status: DC | PRN

## 2012-06-17 MED ORDER — ROCURONIUM BROMIDE 100 MG/10ML IV SOLN
INTRAVENOUS | Status: DC | PRN
  Administered 2012-06-17: 50 mg via INTRAVENOUS

## 2012-06-17 MED ORDER — BUPIVACAINE-EPINEPHRINE PF 0.25-1:200000 % IJ SOLN
INTRAMUSCULAR | Status: AC
  Filled 2012-06-17: qty 30

## 2012-06-17 MED ORDER — OXYCODONE HCL 5 MG PO TABS: 10.0000 mg | ORAL_TABLET | ORAL | Status: DC | PRN

## 2012-06-17 MED ORDER — ONDANSETRON HCL 4 MG/2ML IJ SOLN: 4.0000 mg | INTRAMUSCULAR | Status: DC | PRN

## 2012-06-17 MED ORDER — PROMETHAZINE HCL 25 MG/ML IJ SOLN: 6.2500 mg | INTRAMUSCULAR | Status: DC | PRN

## 2012-06-17 MED ORDER — DIPHENHYDRAMINE HCL 50 MG/ML IJ SOLN
12.5000 mg | Freq: Four times a day (QID) | INTRAMUSCULAR | Status: DC | PRN
  Filled 2012-06-17: qty 0.25

## 2012-06-17 MED ORDER — NALOXONE HCL 0.4 MG/ML IJ SOLN
0.4000 mg | INTRAMUSCULAR | Status: DC | PRN
  Filled 2012-06-17: qty 1

## 2012-06-17 MED ORDER — ONDANSETRON HCL 4 MG/2ML IJ SOLN
4.0000 mg | Freq: Four times a day (QID) | INTRAMUSCULAR | Status: DC | PRN
  Filled 2012-06-17: qty 2

## 2012-06-17 MED ORDER — PROPOFOL 10 MG/ML IV BOLUS
INTRAVENOUS | Status: DC | PRN
  Administered 2012-06-17: 180 mg via INTRAVENOUS
  Administered 2012-06-17: 50 mg via INTRAVENOUS

## 2012-06-17 MED ORDER — DIPHENHYDRAMINE HCL 12.5 MG/5ML PO ELIX
12.5000 mg | ORAL_SOLUTION | Freq: Four times a day (QID) | ORAL | Status: DC | PRN
  Filled 2012-06-17: qty 5

## 2012-06-17 MED ORDER — LACTATED RINGERS IV SOLN: INTRAVENOUS | Status: DC

## 2012-06-17 MED ORDER — FENTANYL CITRATE 0.05 MG/ML IJ SOLN: 50.0000 ug | Freq: Once | INTRAMUSCULAR | Status: DC

## 2012-06-17 MED ORDER — HEMOSTATIC AGENTS (NO CHARGE) OPTIME
TOPICAL | Status: DC | PRN
  Administered 2012-06-17: 1 via TOPICAL

## 2012-06-17 MED ORDER — ACETAMINOPHEN 10 MG/ML IV SOLN
INTRAVENOUS | Status: AC
  Filled 2012-06-17: qty 100

## 2012-06-17 MED ORDER — PHENOL 1.4 % MT LIQD: 1.0000 | OROMUCOSAL | Status: DC | PRN

## 2012-06-17 MED ORDER — LACTATED RINGERS IV SOLN
INTRAVENOUS | Status: DC
  Administered 2012-06-17: 14:00:00 via INTRAVENOUS

## 2012-06-17 MED ORDER — SODIUM CHLORIDE 0.9 % IV SOLN: 250.0000 mL | INTRAVENOUS | Status: DC

## 2012-06-17 MED ORDER — NEOSTIGMINE METHYLSULFATE 1 MG/ML IJ SOLN
INTRAMUSCULAR | Status: DC | PRN
  Administered 2012-06-17: 3 mg via INTRAVENOUS

## 2012-06-17 MED ORDER — ONDANSETRON HCL 4 MG/2ML IJ SOLN
INTRAMUSCULAR | Status: DC | PRN
  Administered 2012-06-17: 4 mg via INTRAVENOUS

## 2012-06-17 MED ORDER — THROMBIN 20000 UNITS EX SOLR
CUTANEOUS | Status: AC
  Filled 2012-06-17: qty 20000

## 2012-06-17 MED ORDER — HYDROMORPHONE HCL PF 1 MG/ML IJ SOLN: 0.2500 mg | INTRAMUSCULAR | Status: DC | PRN

## 2012-06-17 MED ORDER — LIDOCAINE HCL (CARDIAC) 20 MG/ML IV SOLN
INTRAVENOUS | Status: DC | PRN
  Administered 2012-06-17: 80 mg via INTRAVENOUS

## 2012-06-17 MED ORDER — MIDAZOLAM HCL 5 MG/5ML IJ SOLN
INTRAMUSCULAR | Status: DC | PRN
  Administered 2012-06-17: 2 mg via INTRAVENOUS

## 2012-06-17 MED ORDER — LACTATED RINGERS IV SOLN
INTRAVENOUS | Status: DC | PRN
  Administered 2012-06-17 (×2): via INTRAVENOUS

## 2012-06-17 MED ORDER — ZOLPIDEM TARTRATE 5 MG PO TABS: 5.0000 mg | ORAL_TABLET | Freq: Every evening | ORAL | Status: DC | PRN

## 2012-06-17 MED ORDER — GLYCOPYRROLATE 0.2 MG/ML IJ SOLN
INTRAMUSCULAR | Status: DC | PRN
  Administered 2012-06-17: .4 mg via INTRAVENOUS

## 2012-06-17 MED ORDER — SODIUM CHLORIDE 0.9 % IJ SOLN: 3.0000 mL | Freq: Two times a day (BID) | INTRAMUSCULAR | Status: DC

## 2012-06-17 MED ORDER — FENTANYL 10 MCG/ML IV SOLN
INTRAVENOUS | Status: DC
  Administered 2012-06-17: 18:00:00 via INTRAVENOUS
  Administered 2012-06-17: 10 ug via INTRAVENOUS
  Filled 2012-06-17: qty 50

## 2012-06-17 MED ORDER — FENTANYL CITRATE 0.05 MG/ML IJ SOLN
INTRAMUSCULAR | Status: DC | PRN
  Administered 2012-06-17: 150 ug via INTRAVENOUS
  Administered 2012-06-17 (×3): 50 ug via INTRAVENOUS

## 2012-06-17 MED ORDER — METHOCARBAMOL 500 MG PO TABS
500.0000 mg | ORAL_TABLET | Freq: Four times a day (QID) | ORAL | Status: DC | PRN
  Filled 2012-06-17: qty 1

## 2012-06-17 MED ORDER — ACETAMINOPHEN 10 MG/ML IV SOLN
1000.0000 mg | Freq: Four times a day (QID) | INTRAVENOUS | Status: AC
  Administered 2012-06-17 – 2012-06-18 (×4): 1000 mg via INTRAVENOUS
  Filled 2012-06-17 (×4): qty 100

## 2012-06-17 MED ORDER — ACETAMINOPHEN 10 MG/ML IV SOLN
1000.0000 mg | Freq: Once | INTRAVENOUS | Status: AC
  Administered 2012-06-17: 1000 mg via INTRAVENOUS
  Filled 2012-06-17: qty 100

## 2012-06-17 MED ORDER — CEFAZOLIN SODIUM 1-5 GM-% IV SOLN
1.0000 g | Freq: Three times a day (TID) | INTRAVENOUS | Status: AC
  Administered 2012-06-17 – 2012-06-18 (×2): 1 g via INTRAVENOUS
  Filled 2012-06-17 (×2): qty 50

## 2012-06-17 MED ORDER — DEXAMETHASONE 4 MG PO TABS
4.0000 mg | ORAL_TABLET | Freq: Four times a day (QID) | ORAL | Status: DC
  Administered 2012-06-17 – 2012-06-18 (×4): 4 mg via ORAL
  Filled 2012-06-17 (×7): qty 1

## 2012-06-17 MED ORDER — MIDAZOLAM HCL 2 MG/2ML IJ SOLN: 1.0000 mg | INTRAMUSCULAR | Status: DC | PRN

## 2012-06-17 MED ORDER — LOSARTAN POTASSIUM 50 MG PO TABS
50.0000 mg | ORAL_TABLET | Freq: Every day | ORAL | Status: DC
  Administered 2012-06-17 – 2012-06-18 (×2): 50 mg via ORAL
  Filled 2012-06-17 (×2): qty 1

## 2012-06-17 MED ORDER — SODIUM CHLORIDE 0.9 % IJ SOLN: 9.0000 mL | INTRAMUSCULAR | Status: DC | PRN

## 2012-06-17 SURGICAL SUPPLY — 55 items
ADH SKN CLS APL DERMABOND .7 (GAUZE/BANDAGES/DRESSINGS) ×1
BLADE SURG ROTATE 9660 (MISCELLANEOUS) IMPLANT
BUR EGG ELITE 4.0 (BURR) IMPLANT
BUR MATCHSTICK NEURO 3.0 LAGG (BURR) IMPLANT
CANISTER SUCTION 2500CC (MISCELLANEOUS) ×2 IMPLANT
CLOTH BEACON ORANGE TIMEOUT ST (SAFETY) ×2 IMPLANT
CORDS BIPOLAR (ELECTRODE) ×2 IMPLANT
COVER SURGICAL LIGHT HANDLE (MISCELLANEOUS) ×4 IMPLANT
CRADLE DONUT ADULT HEAD (MISCELLANEOUS) ×2 IMPLANT
DERMABOND ADVANCED (GAUZE/BANDAGES/DRESSINGS) ×1
DERMABOND ADVANCED .7 DNX12 (GAUZE/BANDAGES/DRESSINGS) ×1 IMPLANT
DRAPE C-ARM 42X72 X-RAY (DRAPES) ×2 IMPLANT
DRAPE POUCH INSTRU U-SHP 10X18 (DRAPES) ×2 IMPLANT
DRAPE SURG 17X23 STRL (DRAPES) ×1 IMPLANT
DRAPE U-SHAPE 47X51 STRL (DRAPES) ×2 IMPLANT
DRSG MEPILEX BORDER 4X4 (GAUZE/BANDAGES/DRESSINGS) ×2 IMPLANT
DURAPREP 26ML APPLICATOR (WOUND CARE) ×2 IMPLANT
ELECT COATED BLADE 2.86 ST (ELECTRODE) ×2 IMPLANT
ELECT REM PT RETURN 9FT ADLT (ELECTROSURGICAL) ×2
ELECTRODE REM PT RTRN 9FT ADLT (ELECTROSURGICAL) ×1 IMPLANT
GLOVE BIOGEL PI IND STRL 6.5 (GLOVE) ×1 IMPLANT
GLOVE BIOGEL PI IND STRL 8.5 (GLOVE) ×1 IMPLANT
GLOVE BIOGEL PI INDICATOR 6.5 (GLOVE) ×1
GLOVE BIOGEL PI INDICATOR 8.5 (GLOVE) ×1
GLOVE ECLIPSE 6.0 STRL STRAW (GLOVE) ×2 IMPLANT
GLOVE ECLIPSE 8.5 STRL (GLOVE) ×2 IMPLANT
GOWN PREVENTION PLUS XXLARGE (GOWN DISPOSABLE) ×2 IMPLANT
GOWN STRL NON-REIN LRG LVL3 (GOWN DISPOSABLE) ×4 IMPLANT
KIT BASIN OR (CUSTOM PROCEDURE TRAY) ×2 IMPLANT
KIT ROOM TURNOVER OR (KITS) ×2 IMPLANT
NDL SPNL 18GX3.5 QUINCKE PK (NEEDLE) ×1 IMPLANT
NEEDLE SPNL 18GX3.5 QUINCKE PK (NEEDLE) ×2 IMPLANT
NS IRRIG 1000ML POUR BTL (IV SOLUTION) ×2 IMPLANT
PACK ORTHO CERVICAL (CUSTOM PROCEDURE TRAY) ×2 IMPLANT
PACK UNIVERSAL I (CUSTOM PROCEDURE TRAY) ×2 IMPLANT
PAD ARMBOARD 7.5X6 YLW CONV (MISCELLANEOUS) ×4 IMPLANT
PEEK LORDOTIC 8MM (Peek) ×1 IMPLANT
PIN DISTRACTION 14 (PIN) ×2 IMPLANT
PUTTY BONE DBX 2.5 MIS (Bone Implant) ×1 IMPLANT
SCREW SELF DRILLING 16MM (Screw) ×2 IMPLANT
SPONGE INTESTINAL PEANUT (DISPOSABLE) ×1 IMPLANT
SPONGE SURGIFOAM ABS GEL 100 (HEMOSTASIS) ×2 IMPLANT
STRIP CLOSURE SKIN 1/2X4 (GAUZE/BANDAGES/DRESSINGS) ×2 IMPLANT
SURGIFLO TRUKIT (HEMOSTASIS) ×1 IMPLANT
SUT MNCRL AB 3-0 PS2 18 (SUTURE) ×2 IMPLANT
SUT SILK 2 0 (SUTURE) ×2
SUT SILK 2-0 18XBRD TIE 12 (SUTURE) ×1 IMPLANT
SUT VIC AB 2-0 CT1 18 (SUTURE) ×2 IMPLANT
SYR BULB IRRIGATION 50ML (SYRINGE) ×2 IMPLANT
SYR CONTROL 10ML LL (SYRINGE) ×1 IMPLANT
TAPE CLOTH 4X10 WHT NS (GAUZE/BANDAGES/DRESSINGS) ×2 IMPLANT
TAPE UMBILICAL COTTON 1/8X30 (MISCELLANEOUS) ×2 IMPLANT
TOWEL OR 17X24 6PK STRL BLUE (TOWEL DISPOSABLE) ×2 IMPLANT
TOWEL OR 17X26 10 PK STRL BLUE (TOWEL DISPOSABLE) ×2 IMPLANT
WATER STERILE IRR 1000ML POUR (IV SOLUTION) ×2 IMPLANT

## 2012-06-17 NOTE — Brief Op Note (Signed)
06/17/2012  4:54 PM  PATIENT:  [name]  66 y.o. female  PRE-OPERATIVE DIAGNOSIS:  cervical facet syndrome   POST-OPERATIVE DIAGNOSIS:  cervical facet syndrome  PROCEDURE:  Procedure(s) (LRB) with comments: ANTERIOR CERVICAL DECOMPRESSION/DISCECTOMY FUSION 1 LEVEL (N/A) - ANTERIOR CERVICAL DISCECTOMY FUSION (acdf) C-3-C4   SURGEON:  Surgeon(s) and Role:    * Venita Lick, MD - Primary  PHYSICIAN ASSISTANT:   ASSISTANTS: Norval Gable   ANESTHESIA:   general  EBL:  Total I/O In: 1000 [I.V.:1000] Out: 100 [Blood:100]  BLOOD ADMINISTERED:none  DRAINS: none   LOCAL MEDICATIONS USED:  MARCAINE     SPECIMEN:  No Specimen  DISPOSITION OF SPECIMEN:  N/A  COUNTS:  YES  TOURNIQUET:  * No tourniquets in log *  DICTATION: .Other Dictation: Dictation Number [id number]  PLAN OF CARE: Admit to inpatient   PATIENT DISPOSITION:  PACU - hemodynamically stable.

## 2012-06-17 NOTE — Anesthesia Postprocedure Evaluation (Signed)
Anesthesia Post Note  Patient: [name]  Procedure(s) Performed: Procedure(s) (LRB): ANTERIOR CERVICAL DECOMPRESSION/DISCECTOMY FUSION 1 LEVEL (N/A)  Anesthesia type: general  Patient location: PACU  Post pain: Pain level controlled  Post assessment: Patient's Cardiovascular Status Stable  Last Vitals:  Filed Vitals:   06/17/12 1800  BP: 156/76  Pulse: 49  Temp: 36.3 C  Resp: 11    Post vital signs: Reviewed and stable  Level of consciousness: sedated  Complications: No apparent anesthesia complications

## 2012-06-17 NOTE — Anesthesia Preprocedure Evaluation (Addendum)
Anesthesia Evaluation  Patient identified by MRN, date of birth, ID band Patient awake    Reviewed: Allergy & Precautions, H&P , NPO status , Patient's Chart, lab work & pertinent test results  Airway Mallampati: II TM Distance: >3 FB Neck ROM: Full    Dental   Pulmonary neg sleep apnea,  breath sounds clear to auscultation        Cardiovascular hypertension, Pt. on home beta blockers + dysrhythmias Atrial Fibrillation Rhythm:Regular Rate:Normal     Neuro/Psych    GI/Hepatic   Endo/Other    Renal/GU      Musculoskeletal   Abdominal (+) + obese,   Peds  Hematology   Anesthesia Other Findings   Reproductive/Obstetrics                          Anesthesia Physical Anesthesia Plan  ASA: III  Anesthesia Plan: General   Post-op Pain Management:    Induction: Intravenous  Airway Management Planned: Oral ETT  Additional Equipment:   Intra-op Plan:   Post-operative Plan: Extubation in OR  Informed Consent: I have reviewed the patients History and Physical, chart, labs and discussed the procedure including the risks, benefits and alternatives for the proposed anesthesia with the patient or authorized representative who has indicated his/her understanding and acceptance.     Plan Discussed with: CRNA, Surgeon and Anesthesiologist  Anesthesia Plan Comments:        Anesthesia Quick Evaluation

## 2012-06-17 NOTE — Transfer of Care (Signed)
Immediate Anesthesia Transfer of Care Note  Patient: Dion Body  Procedure(s) Performed: Procedure(s) (LRB) with comments: ANTERIOR CERVICAL DECOMPRESSION/DISCECTOMY FUSION 1 LEVEL (N/A) - ANTERIOR CERVICAL DISCECTOMY FUSION (acdf) C-3-C4   Patient Location: PACU  Anesthesia Type: General  Level of Consciousness: awake, alert , oriented and patient cooperative  Airway & Oxygen Therapy: Patient Spontanous Breathing and Patient connected to nasal cannula oxygen  Post-op Assessment: Report given to PACU RN, Post -op Vital signs reviewed and stable, Patient moving all extremities and Patient moving all extremities X 4  Post vital signs: Reviewed and stable  Complications: No apparent anesthesia complications

## 2012-06-17 NOTE — Anesthesia Procedure Notes (Signed)
Procedure Name: Intubation Date/Time: 06/17/2012 3:20 PM Performed by: Margaree Mackintosh Pre-anesthesia Checklist: Patient identified, Timeout performed, Emergency Drugs available, Suction available and Patient being monitored Patient Re-evaluated:Patient Re-evaluated prior to inductionOxygen Delivery Method: Circle system utilized Preoxygenation: Pre-oxygenation with 100% oxygen Intubation Type: IV induction Ventilation: Mask ventilation without difficulty Laryngoscope Size: Mac and 3 Grade View: Grade I Tube type: Oral Tube size: 7.5 mm Number of attempts: 1 Airway Equipment and Method: Stylet and LTA kit utilized Placement Confirmation: ETT inserted through vocal cords under direct vision,  positive ETCO2 and breath sounds checked- equal and bilateral Secured at: 23 cm Tube secured with: Tape Dental Injury: Teeth and Oropharynx as per pre-operative assessment  Comments: Intubation by Rich Number, CRNA

## 2012-06-17 NOTE — H&P (Signed)
H+P reviewed No change to clinical exam

## 2012-06-17 NOTE — Preoperative (Signed)
Beta Blockers   Reason not to administer Beta Blockers:Not Applicable, took metoprolol this am 

## 2012-06-18 ENCOUNTER — Encounter (HOSPITAL_COMMUNITY): Payer: Self-pay | Admitting: Orthopedic Surgery

## 2012-06-18 MED ORDER — ONDANSETRON HCL 4 MG PO TABS: 4.0000 mg | ORAL_TABLET | Freq: Three times a day (TID) | ORAL | Status: DC | PRN

## 2012-06-18 MED ORDER — OXYCODONE-ACETAMINOPHEN 10-325 MG PO TABS: 1.0000 | ORAL_TABLET | Freq: Four times a day (QID) | ORAL | Status: DC | PRN

## 2012-06-18 MED ORDER — DRONEDARONE HCL 400 MG PO TABS
400.0000 mg | ORAL_TABLET | Freq: Two times a day (BID) | ORAL | Status: DC
  Filled 2012-06-18 (×3): qty 1

## 2012-06-18 MED ORDER — METHOCARBAMOL 500 MG PO TABS: 500.0000 mg | ORAL_TABLET | Freq: Three times a day (TID) | ORAL | Status: DC

## 2012-06-18 NOTE — Progress Notes (Signed)
Referral received for SNF. Chart reviewed and CSW has spoken with RNCM who indicates that patient is for DC to home with Home Health and DME.  CSW to sign off. Please re-consult if CSW needs arise.  Idil Maslanka T. Caldwell Kronenberger, LCSWA  [phone]  

## 2012-06-18 NOTE — Progress Notes (Signed)
    Subjective: Procedure(s) (LRB): ANTERIOR CERVICAL DECOMPRESSION/DISCECTOMY FUSION 1 LEVEL (N/A) 1 Day Post-Op  Patient reports pain as 3 on 0-10 scale.  Reports none arm pain reports incisional neck pain   Positive void Negative bowel movement Positive flatus Negative chest pain or shortness of breath  Objective: Vital signs in last 24 hours: Temp:  [97.3 F (36.3 C)-98.1 F (36.7 C)] 97.6 F (36.4 C) (09/26 0600) Pulse Rate:  [49-77] 70  (09/26 0600) Resp:  [11-25] 16  (09/26 0600) BP: (115-163)/(69-91) 128/81 mmHg (09/26 0600) SpO2:  [94 %-100 %] 97 % (09/26 0600) Weight:  [94.348 kg (208 lb)] 94.348 kg (208 lb) (09/25 1848)  Intake/Output from previous day: 09/25 0701 - 09/26 0700 In: 1300 [I.V.:1300] Out: 100 [Blood:100]  Labs: No results found for this basename: WBC:2,RBC:2,HCT:2,PLT:2 in the last 72 hours No results found for this basename: NA:2,K:2,CL:2,CO2:2,BUN:2,CREATININE:2,GLUCOSE:2,CALCIUM:2 in the last 72 hours No results found for this basename: LABPT:2,INR:2 in the last 72 hours  Physical Exam: Neurologically intact Neurovascular intact Incision: dressing C/D/I Compartment soft  Assessment/Plan: Patient stable  Pain controlled xrays per Dr. Shon Baton Continue care per protocol until time of discharge (mobilization, incentive spirometry) Discharge home today  [place] for Dr. Venita Lick [facility] [phone] 06/18/2012, 11:57 AM
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
## 2012-06-18 NOTE — Progress Notes (Signed)
PT EVALUATION / OT SIGN-OFF  OT order received and patient screened with no acute OT needs at this time. If acute OT needs arise, OT will evaluate. Please refer to discharge planning section for recommendations for equipment. Thank you  OT spoke with patient regarding UE and reports no numbness or tingling. Pt with handout from MD with wand exercises. Pt educated on the exercises and remaining within 90 degrees. Pt provided hand out from adl safety. PT Toniann Fail to discuss handout with patient to ensure no questions. Pt up and ambulating hallways    Lucile Shutters   OTR/L Pager: [phone] Office: [phone] .

## 2012-06-18 NOTE — Progress Notes (Signed)
Fentanyl PCA d/c'd this am at 0600, 47ml wasted in sink per pharmacy instruction.  3ml given.  Witnessed by Rella Larve, CN.

## 2012-06-18 NOTE — Progress Notes (Signed)
D/c instructions reviewed with pt and husband. Copy of instructions and scripts given to pt. Pt and husband able to don aspen collar without problem. See education documentation. Pt d/c'd via wheelchair with belongings with husband and escorted by unit staff NT.

## 2012-06-18 NOTE — Op Note (Signed)
NAME:  [name], [name] NO.:  [id number]  MEDICAL RECORD NO.:  [id number]  LOCATION:  [id number]                        FACILITY:  [facility]  PHYSICIAN:  Alvy Beal, MD    DATE OF BIRTH:  29-May-1946  DATE OF PROCEDURE: DATE OF DISCHARGE:                              OPERATIVE REPORT   PREOPERATIVE DIAGNOSIS:  Axial neck pain (facet mediated).  POSTOPERATIVE DIAGNOSIS:  Axial neck pain (facet mediated).  OPERATIVE PROCEDURE:  Anterior cervical diskectomy, C3-4.  COMPLICATIONS:  None.  CONDITION:  Stable.  INSTRUMENTATION SYSTEM USED:  Synthes 0 profile anterior cervical PEEK cage with a locking plate (0 profile).  It was a size 8 plate with 16 mm locking screws.  No intraoperative complications.  SURGEON:  Tagen Milby D. Shon Baton, MD  FIRST ASSISTANT:  Norval Gable, PA.  HISTORY:  This is a very pleasant woman who has had progressive severe disabling neck pain.  After failure of conservative management, we had identified the C3-4 level as the pain source.  After discussing treatment options, she elected to proceed with surgery.  All appropriate risks, benefits, and alternatives were discussed with the patient and consent was obtained.  OPERATIVE NOTE:  The patient was brought to the operating room and placed supine on the operating table.  After successful induction of general anesthesia and endotracheal intubation, TEDs and SCDs were applied.  The patient was placed supine on the __________.  All bony prominences were well padded, and the neck was prepped and draped in a standard fashion.  Time-out was done confirming the patient, procedure, the appropriate level, and all other pertinent important data.  Once this was completed, I identified within the lateral plane the incision marker and then made a transverse incision centered over 3-4 disk space. I dissected through the subcutaneous adipose tissue down to the platysma.  Platysma was sharply incised and I  began bluntly dissecting through the deep cervical fascia.  I palpated the carotid sheath and protected it laterally with a finger and swept the esophagus medially and protected with the appendiceal retractor.  I then dissected down to the remaining prevertebral fascia to the anterior longitudinal ligament. A needle was placed in the 3-4 disk space, and I took an x-ray, confirmed that I was at the 3-4 disk space.  Once this was done, I mobilized the longus coli muscles with bipolar electrocautery from the midbody of C3 to midbody of C4 bilaterally.  I then placed the retracting blades beneath the longus coli muscle, deflated the endotracheal cuff, and expanded the retractor to the appropriate width. I then placed distraction pins into the bodies of C3 and C4 and then performed an annulotomy with a #15 blade scalpel.  Once this was done, using pituitary rongeurs, curettes, and Kerrison rongeurs, I removed the disk material at 3-4 level.  I then used a micro nerve hook to release the anulus from the posterior aspect of the vertebral bodies of C3 and C4.  Once this was done, I was able to get adequate parallel distraction. Once the diskectomy was completed, I then rasped the endplates to ensure I had bleeding subchondral bone.  Once I had done this, I then trialed with a 7  and 8 mm PEEK interbody cage trial and elected to use the 8.  I then obtained the 8.0 profile cage packed it with DBX mix and malleted to the appropriate depth.  I then placed through bone holes.  The 16 mm superior and inferior screw and locked it in place.  I made sure that the screws would below the locking device.  With the hardware intact, I irrigated with copious normal saline and removed the remaining retractors.  I then obtained hemostasis using bipolar electrocautery and FloSeal.  After final irrigation, there was no active bleeding, so I returned the trachea and esophagus to midline and closed the platysma with  interrupted 2-0 Vicryl sutures and then the skin with 3-0 Monocryl. Steri-Strips, dry dressing, and an Aspen collar were applied.  The patient was extubated, transferred to the PACU without incident.  At the end of the case, all needle and sponge counts were correct.  There were no adverse intraoperative events.     Alvy Beal, MD     DDB/MEDQ  D:  06/17/2012  T:  06/18/2012  Job:  [id number]

## 2012-06-18 NOTE — Evaluation (Signed)
Physical Therapy Evaluation Patient Details Name: [name] MRN: [id number] DOB: Mar 30, 1946 Today's Date: 06/18/2012 Time: 4098-1191 PT Time Calculation (min): 29 min  PT Assessment / Plan / Recommendation Clinical Impression  Pt is 66 y/o female admitted for s/p Anterior Cervial Fusion C3-4.  Pt moving well and moving independently.  Pt completed stair negotiation, stepping over tub, donning aspen collar,  and overall mobility with modified independence.  Pt demonstrated and able to recall cervical precautions and handout.  Therefore no further PT needs and no OT needs at this time.  OT notified.  PT will sign off.     PT Assessment  Patent does not need any further PT services    Follow Up Recommendations  Outpatient PT (When or if MD deemed appropriate)    Barriers to Discharge  NONE      Equipment Recommendations  None recommended by PT    Recommendations for Other Services  (none)   Frequency      Precautions / Restrictions Precautions Precautions: Cervical Precaution Comments: Cervical handout given and reviewed Required Braces or Orthoses: Cervical Brace Cervical Brace: Hard collar;Applied in sitting position   Pertinent Vitals/Pain No c/o pain but c/o soreness      Mobility  Bed Mobility Bed Mobility: Rolling Right;Right Sidelying to Sit Rolling Right: 6: Modified independent (Device/Increase time) Right Sidelying to Sit: 6: Modified independent (Device/Increase time) Transfers Transfers: Sit to Stand;Stand to Sit Sit to Stand: 6: Modified independent (Device/Increase time);From bed Stand to Sit: 6: Modified independent (Device/Increase time);To bed Ambulation/Gait Ambulation/Gait Assistance: 6: Modified independent (Device/Increase time) Ambulation Distance (Feet): 400 Feet Assistive device: None Stairs: Yes Stairs Assistance: 6: Modified independent (Device/Increase time) Stair Management Technique: One rail Left Number of Stairs: 5     Shoulder  Instructions     Exercises     PT Diagnosis:    PT Problem List:   PT Treatment Interventions:     PT Goals    Visit Information  Last PT Received On: 06/18/12 Assistance Needed: +1    Subjective Data  Subjective: "I've been walking around already." Patient Stated Goal: To go home today   Prior Functioning  Home Living Lives With: Spouse Available Help at Discharge: Family Type of Home: House Home Access: Stairs to enter Secretary/administrator of Steps: 6 Entrance Stairs-Rails: Left Home Layout: One level Bathroom Shower/Tub: Forensic scientist: Standard Home Adaptive Equipment: Crutches;Hand-held shower hose Prior Function Level of Independence: Independent Able to Take Stairs?: Yes Driving: Yes Vocation:  (works in yard and house) Musician: No difficulties Dominant Hand: Right    Cognition  Overall Cognitive Status: Appears within functional limits for tasks assessed/performed Arousal/Alertness: Awake/alert Orientation Level: Appears intact for tasks assessed Behavior During Session: [facility] for tasks performed    Extremity/Trunk Assessment Right Upper Extremity Assessment RUE ROM/Strength/Tone: Within functional levels RUE Sensation: WFL - Light Touch Left Upper Extremity Assessment LUE ROM/Strength/Tone: Within functional levels LUE Sensation: WFL - Light Touch Right Lower Extremity Assessment RLE ROM/Strength/Tone: Within functional levels Left Lower Extremity Assessment LLE ROM/Strength/Tone: Within functional levels   Balance    End of Session PT - End of Session Equipment Utilized During Treatment: Gait belt;Cervical collar Activity Tolerance: Patient tolerated treatment well Patient left:  (Standing in room with RN present) Nurse Communication: Mobility status  GP Functional Limitation: Mobility: Walking and moving around Mobility: Walking and Moving Around Current Status ([id number]): 0 percent impaired, limited  or restricted Mobility: Walking and Moving Around Discharge Status [phone]): 0 percent  impaired, limited or restricted   Yuri Fana 06/18/2012, 10:24 AM Jake Shark, PT DPT [phone]

## 2012-07-03 ENCOUNTER — Other Ambulatory Visit: Payer: Self-pay | Admitting: Cardiology

## 2012-07-03 NOTE — Telephone Encounter (Signed)
Fax Received. Refill Completed. Regenia Erck Chowoe (R.M.A)   

## 2012-07-07 ENCOUNTER — Other Ambulatory Visit (INDEPENDENT_AMBULATORY_CARE_PROVIDER_SITE_OTHER): Payer: Medicare Other

## 2012-07-07 DIAGNOSIS — I4891 Unspecified atrial fibrillation: Secondary | ICD-10-CM

## 2012-07-07 LAB — CBC WITH DIFFERENTIAL/PLATELET
Eosinophils Relative: 7.7 % — ABNORMAL HIGH (ref 0.0–5.0)
HCT: 35.2 % — ABNORMAL LOW (ref 36.0–46.0)
Lymphocytes Relative: 30.3 % (ref 12.0–46.0)
Monocytes Relative: 11.1 % (ref 3.0–12.0)
Neutrophils Relative %: 50.3 % (ref 43.0–77.0)
Platelets: 270 10*3/uL (ref 150.0–400.0)
WBC: 6.1 10*3/uL (ref 4.5–10.5)

## 2012-07-21 ENCOUNTER — Other Ambulatory Visit: Payer: Self-pay | Admitting: Cardiology

## 2012-10-06 ENCOUNTER — Encounter: Payer: Self-pay | Admitting: Cardiology

## 2012-10-06 ENCOUNTER — Ambulatory Visit (INDEPENDENT_AMBULATORY_CARE_PROVIDER_SITE_OTHER): Payer: Medicare Other | Admitting: Cardiology

## 2012-10-06 VITALS — BP 110/68 | HR 66 | Ht 63.0 in | Wt 218.0 lb

## 2012-10-06 DIAGNOSIS — I4891 Unspecified atrial fibrillation: Secondary | ICD-10-CM

## 2012-10-06 DIAGNOSIS — I10 Essential (primary) hypertension: Secondary | ICD-10-CM

## 2012-10-06 MED ORDER — LOSARTAN POTASSIUM 25 MG PO TABS: 25.0000 mg | ORAL_TABLET | Freq: Every day | ORAL | Status: DC

## 2012-10-06 MED ORDER — METOPROLOL SUCCINATE ER 25 MG PO TB24: 25.0000 mg | ORAL_TABLET | Freq: Every day | ORAL | Status: DC

## 2012-10-06 MED ORDER — FLECAINIDE ACETATE 50 MG PO TABS: 50.0000 mg | ORAL_TABLET | Freq: Two times a day (BID) | ORAL | Status: DC
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
## 2012-10-06 NOTE — Patient Instructions (Addendum)
Stop multaq.  Start flecainide 50mg  every 12 hours(two times a day) starting tomorrow morning.  Increase Toprol XL(metoprolol succinate) to 25mg  daily.   Decrease losartan to 25mg  daily. You can take 1/2 of your 50mg  tablet and use your current supply.  Your physician has recommended that you wear a holter monitor. Holter monitors are medical devices that record the heart's electrical activity. Doctors most often use these monitors to diagnose arrhythmias. Arrhythmias are problems with the speed or rhythm of the heartbeat. The monitor is a small, portable device. You can wear one while you do your normal daily activities. This is usually used to diagnose what is causing palpitations/syncope (passing out). 48 hour monitor---IN  1 WEEK.  Your physician recommends that you schedule a follow-up appointment in: 2-3 weeks with Dr Shirlee Latch.

## 2012-10-09 NOTE — Addendum Note (Signed)
Addended by: Micki Riley C on: 10/09/2012 02:26 PM   Modules accepted: Orders

## 2012-10-14 ENCOUNTER — Encounter (INDEPENDENT_AMBULATORY_CARE_PROVIDER_SITE_OTHER): Payer: Medicare Other

## 2012-10-14 ENCOUNTER — Telehealth: Payer: Self-pay | Admitting: *Deleted

## 2012-10-14 DIAGNOSIS — I4891 Unspecified atrial fibrillation: Secondary | ICD-10-CM

## 2012-10-14 NOTE — Telephone Encounter (Signed)
48 hr holter moniter placed on Pt 10/14/12 TK

## 2012-10-21 ENCOUNTER — Ambulatory Visit: Payer: Medicare Other | Admitting: Cardiology

## 2012-11-02 ENCOUNTER — Other Ambulatory Visit: Payer: Self-pay | Admitting: Cardiology

## 2012-11-06 ENCOUNTER — Ambulatory Visit: Payer: Medicare Other | Admitting: Cardiology

## 2012-11-17 ENCOUNTER — Telehealth: Payer: Self-pay | Admitting: *Deleted

## 2012-11-17 NOTE — Telephone Encounter (Signed)
Dr Shirlee Latch reviewed monitor done 10/14/12--NSR with PACs. No atrial fibrillation noted. Pt notified.

## 2012-11-23 ENCOUNTER — Ambulatory Visit (INDEPENDENT_AMBULATORY_CARE_PROVIDER_SITE_OTHER): Payer: Medicare Other | Admitting: Cardiology

## 2012-11-23 ENCOUNTER — Encounter: Payer: Self-pay | Admitting: Cardiology

## 2012-11-23 VITALS — BP 122/78 | HR 50 | Ht 63.0 in | Wt 221.8 lb

## 2012-11-23 DIAGNOSIS — I10 Essential (primary) hypertension: Secondary | ICD-10-CM

## 2012-11-23 DIAGNOSIS — I4891 Unspecified atrial fibrillation: Secondary | ICD-10-CM
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
## 2012-11-23 NOTE — Patient Instructions (Addendum)
You have been referred to Dr [facility] --evaluate for atrial fibrillation ablation. ASAP  Your physician wants you to follow-up in: 4 months with Dr Shirlee Latch. (July 2014). You will receive a reminder letter in the mail two months in advance. If you don't receive a letter, please call our office to schedule the follow-up appointment.

## 2012-12-17 ENCOUNTER — Ambulatory Visit (INDEPENDENT_AMBULATORY_CARE_PROVIDER_SITE_OTHER): Payer: Medicare Other | Admitting: Internal Medicine

## 2012-12-17 ENCOUNTER — Encounter: Payer: Self-pay | Admitting: Internal Medicine

## 2012-12-17 VITALS — BP 128/78 | HR 61 | Ht 63.0 in | Wt 226.0 lb

## 2012-12-17 DIAGNOSIS — I4891 Unspecified atrial fibrillation: Secondary | ICD-10-CM

## 2012-12-17 DIAGNOSIS — R0989 Other specified symptoms and signs involving the circulatory and respiratory systems: Secondary | ICD-10-CM

## 2012-12-17 DIAGNOSIS — R0683 Snoring: Secondary | ICD-10-CM

## 2012-12-17 DIAGNOSIS — G4733 Obstructive sleep apnea (adult) (pediatric): Secondary | ICD-10-CM

## 2012-12-17 MED ORDER — FLECAINIDE ACETATE 100 MG PO TABS: 100.0000 mg | ORAL_TABLET | Freq: Two times a day (BID) | ORAL | Status: DC

## 2012-12-17 NOTE — Patient Instructions (Addendum)
Your physician recommends that you schedule a follow-up appointment as scheduled with Dr Shirlee Latch  Weight Loss  Your physician has recommended that you have a sleep study. This test records several body functions during sleep, including: brain activity, eye movement, oxygen and carbon dioxide blood levels, heart rate and rhythm, breathing rate and rhythm, the flow of air through your mouth and nose, snoring, body muscle movements, and chest and belly movement.   Your physician has recommended you make the following change in your medication:  1) Increase Flecainide to 100mg  twice daily
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
## 2012-12-28 NOTE — Progress Notes (Signed)
Surgery scheduled for 01/13/13.  Preop 01/05/13 at 0800am.  Need orders in EPIC.  Thanks.

## 2012-12-29 ENCOUNTER — Encounter (HOSPITAL_COMMUNITY): Payer: Self-pay | Admitting: Pharmacy Technician

## 2012-12-30 ENCOUNTER — Telehealth: Payer: Self-pay | Admitting: Internal Medicine

## 2012-12-30 NOTE — Telephone Encounter (Signed)
New problem   Pt stated she doesn't want to scheduled her sleep apnea test until after her knee surgeries. One sx in on 01/13/13 and after one knee heal then she will have other one done.

## 2012-12-31 ENCOUNTER — Other Ambulatory Visit: Payer: Self-pay | Admitting: Orthopedic Surgery

## 2012-12-31 MED ORDER — BUPIVACAINE LIPOSOME 1.3 % IJ SUSP: 20.0000 mL | Freq: Once | INTRAMUSCULAR | Status: DC

## 2012-12-31 MED ORDER — DEXAMETHASONE SODIUM PHOSPHATE 10 MG/ML IJ SOLN: 10.0000 mg | Freq: Once | INTRAMUSCULAR | Status: DC

## 2012-12-31 NOTE — Progress Notes (Signed)
Preoperative surgical orders have been place into the Epic hospital system for [name] on 12/31/2012, 2:49 PM  by Patrica Duel for surgery on 01/13/2013.  Preop Total Knee orders including Experal, IV Tylenol, and IV Decadron as long as there are no contraindications to the above medications. Avel Peace, PA-C

## 2013-01-04 NOTE — Patient Instructions (Signed)
[name]  01/04/2013   Your procedure is scheduled on:  01/13/13   Report to [facility] at     1045  AM.  Call this number if you have problems the morning of surgery: [phone]   Remember:   Do not eat food after midnite.  May have clear liquids until 0630am then npo.    Take these medicines the morning of surgery with A SIP OF WATER:    Do not wear jewelry, make-up or nail polish.  Do not wear lotions, powders, or perfumes.   Do not shave 48 hours prior to surgery.   Do not bring valuables to the hospital.  Contacts, dentures or bridgework may not be worn into surgery.  Leave suitcase in the car. After surgery it may be brought to your room.  For patients admitted to the hospital, checkout time is 11:00 AM the day of  discharge.     SEE CHG INSTRUCTION SHEET    Please read over the following fact sheets that you were given: MRSA Information, coughing and deep breathing exercises, leg exercises, Blood transfusion fact sheet, Incentive Spirometry Fact sheet                Failure to comply with these instructions may result in cancellation of your surgery.                Patient Signature ____________________________              Nurse Signature _____________________________

## 2013-01-05 ENCOUNTER — Encounter (HOSPITAL_COMMUNITY)
Admission: RE | Admit: 2013-01-05 | Discharge: 2013-01-05 | Disposition: A | Discharge status: Home / Self Care | Payer: Medicare Other | Source: Ambulatory Visit | Attending: Orthopedic Surgery | Admitting: Orthopedic Surgery

## 2013-01-05 ENCOUNTER — Encounter (HOSPITAL_COMMUNITY): Payer: Self-pay

## 2013-01-05 DIAGNOSIS — Z01812 Encounter for preprocedural laboratory examination: Secondary | ICD-10-CM | POA: Insufficient documentation

## 2013-01-05 LAB — COMPREHENSIVE METABOLIC PANEL
ALT: 21 U/L (ref 0–35)
Alkaline Phosphatase: 80 U/L (ref 39–117)
BUN: 19 mg/dL (ref 6–23)
CO2: 27 mEq/L (ref 19–32)
Calcium: 9.6 mg/dL (ref 8.4–10.5)
GFR calc Af Amer: >90 mL/min (ref >90)
GFR calc non Af Amer: >90 mL/min (ref >90)
Glucose, Bld: 114 mg/dL — ABNORMAL HIGH (ref 70–99)
Potassium: 4.2 mEq/L (ref 3.5–5.1)
Sodium: 136 mEq/L (ref 135–145)

## 2013-01-05 LAB — URINALYSIS, ROUTINE W REFLEX MICROSCOPIC
Bilirubin Urine: NEGATIVE
Glucose, UA: NEGATIVE mg/dL
Hgb urine dipstick: NEGATIVE
Specific Gravity, Urine: 1.015 (ref 1.005–1.030)

## 2013-01-05 LAB — CBC
HCT: 35.2 % — ABNORMAL LOW (ref 36.0–46.0)
Hemoglobin: 11.9 g/dL — ABNORMAL LOW (ref 12.0–15.0)
MCH: 29.3 pg (ref 26.0–34.0)
MCHC: 33.8 g/dL (ref 30.0–36.0)
RBC: 4.06 MIL/uL (ref 3.87–5.11)

## 2013-01-05 LAB — URINE MICROSCOPIC-ADD ON

## 2013-01-05 LAB — ABO/RH: ABO/RH(D): O POS

## 2013-01-05 LAB — PROTIME-INR: Prothrombin Time: 14.3 seconds (ref 11.6–15.2)

## 2013-01-05 LAB — SURGICAL PCR SCREEN
MRSA, PCR: NEGATIVE
Staphylococcus aureus: NEGATIVE

## 2013-01-05 NOTE — Progress Notes (Signed)
Urinalysis with micro results faxed via EPIC to Dr Aluisio.   

## 2013-01-05 NOTE — Progress Notes (Signed)
Last office visit with Dr Johney Frame- 12/17/12 EPIC  Last office visit with Dr Shirlee Latch 11/23/12 [facility]  Stress Test 2/13 EPIC  ECHO 2/13 EPIC  Holter Monitor 10/14/12 [facility]

## 2013-01-13 ENCOUNTER — Encounter (HOSPITAL_COMMUNITY)
Admission: RE | Disposition: A | Discharge status: Home Health | Payer: Self-pay | Source: Ambulatory Visit | Attending: Orthopedic Surgery

## 2013-01-13 ENCOUNTER — Encounter (HOSPITAL_COMMUNITY): Payer: Self-pay | Admitting: Certified Registered Nurse Anesthetist

## 2013-01-13 ENCOUNTER — Encounter (HOSPITAL_COMMUNITY): Payer: Self-pay | Admitting: *Deleted

## 2013-01-13 ENCOUNTER — Inpatient Hospital Stay (HOSPITAL_COMMUNITY)
Admission: RE | Admit: 2013-01-13 | Discharge: 2013-01-15 | DRG: 470 | Disposition: A | Discharge status: Home Health | Payer: Medicare Other | Source: Ambulatory Visit | Attending: Orthopedic Surgery | Admitting: Orthopedic Surgery

## 2013-01-13 ENCOUNTER — Inpatient Hospital Stay (HOSPITAL_COMMUNITY): Payer: Medicare Other | Admitting: Certified Registered Nurse Anesthetist

## 2013-01-13 DIAGNOSIS — E669 Obesity, unspecified: Secondary | ICD-10-CM | POA: Diagnosis present

## 2013-01-13 DIAGNOSIS — I4891 Unspecified atrial fibrillation: Secondary | ICD-10-CM | POA: Diagnosis present

## 2013-01-13 DIAGNOSIS — Z01812 Encounter for preprocedural laboratory examination: Secondary | ICD-10-CM

## 2013-01-13 DIAGNOSIS — Z96651 Presence of right artificial knee joint: Secondary | ICD-10-CM

## 2013-01-13 DIAGNOSIS — I10 Essential (primary) hypertension: Secondary | ICD-10-CM | POA: Diagnosis present

## 2013-01-13 DIAGNOSIS — M1710 Unilateral primary osteoarthritis, unspecified knee: Principal | ICD-10-CM | POA: Diagnosis present

## 2013-01-13 DIAGNOSIS — Z6839 Body mass index (BMI) 39.0-39.9, adult: Secondary | ICD-10-CM

## 2013-01-13 HISTORY — PX: TOTAL KNEE ARTHROPLASTY: SHX125

## 2013-01-13 SURGERY — ARTHROPLASTY, KNEE, TOTAL
Anesthesia: Spinal | Site: Knee | Laterality: Right | Wound class: Clean

## 2013-01-13 MED ORDER — ACETAMINOPHEN 325 MG PO TABS
650.0000 mg | ORAL_TABLET | Freq: Four times a day (QID) | ORAL | Status: DC | PRN
  Administered 2013-01-15 (×2): 650 mg via ORAL
  Filled 2013-01-13 (×2): qty 2

## 2013-01-13 MED ORDER — ACETAMINOPHEN 10 MG/ML IV SOLN
INTRAVENOUS | Status: DC | PRN
  Administered 2013-01-13: 1000 mg via INTRAVENOUS

## 2013-01-13 MED ORDER — DEXAMETHASONE 6 MG PO TABS
10.0000 mg | ORAL_TABLET | Freq: Every day | ORAL | Status: AC
  Filled 2013-01-13: qty 1

## 2013-01-13 MED ORDER — SODIUM CHLORIDE 0.9 % IR SOLN
Status: DC | PRN
  Administered 2013-01-13: 1000 mL

## 2013-01-13 MED ORDER — MENTHOL 3 MG MT LOZG: 1.0000 | LOZENGE | OROMUCOSAL | Status: DC | PRN

## 2013-01-13 MED ORDER — DEXAMETHASONE SODIUM PHOSPHATE 10 MG/ML IJ SOLN
INTRAMUSCULAR | Status: DC | PRN
  Administered 2013-01-13: 10 mg via INTRAVENOUS

## 2013-01-13 MED ORDER — METOCLOPRAMIDE HCL 5 MG/ML IJ SOLN
5.0000 mg | Freq: Three times a day (TID) | INTRAMUSCULAR | Status: DC | PRN
Start: 2013-01-13 — End: 2013-01-15

## 2013-01-13 MED ORDER — LACTATED RINGERS IV SOLN
INTRAVENOUS | Status: DC
  Administered 2013-01-13: 1000 mL via INTRAVENOUS

## 2013-01-13 MED ORDER — STERILE WATER FOR IRRIGATION IR SOLN
Status: DC | PRN
  Administered 2013-01-13: 3000 mL

## 2013-01-13 MED ORDER — RIVAROXABAN 10 MG PO TABS
10.0000 mg | ORAL_TABLET | Freq: Every day | ORAL | Status: DC
  Administered 2013-01-14 – 2013-01-15 (×2): 10 mg via ORAL
  Filled 2013-01-13 (×3): qty 1

## 2013-01-13 MED ORDER — ONDANSETRON HCL 4 MG PO TABS: 4.0000 mg | ORAL_TABLET | Freq: Four times a day (QID) | ORAL | Status: DC | PRN

## 2013-01-13 MED ORDER — ACETAMINOPHEN 650 MG RE SUPP: 650.0000 mg | Freq: Four times a day (QID) | RECTAL | Status: DC | PRN

## 2013-01-13 MED ORDER — MIDAZOLAM HCL 5 MG/5ML IJ SOLN
INTRAMUSCULAR | Status: DC | PRN
  Administered 2013-01-13: 2 mg via INTRAVENOUS

## 2013-01-13 MED ORDER — ACETAMINOPHEN 10 MG/ML IV SOLN: 1000.0000 mg | Freq: Once | INTRAVENOUS | Status: DC

## 2013-01-13 MED ORDER — BISACODYL 10 MG RE SUPP: 10.0000 mg | Freq: Every day | RECTAL | Status: DC | PRN

## 2013-01-13 MED ORDER — OXYCODONE HCL 5 MG PO TABS
5.0000 mg | ORAL_TABLET | ORAL | Status: DC | PRN
  Administered 2013-01-13: 20 mg via ORAL
  Administered 2013-01-13: 15 mg via ORAL
  Administered 2013-01-14 (×2): 10 mg via ORAL
  Administered 2013-01-14: 20 mg via ORAL
  Administered 2013-01-14: 10 mg via ORAL
  Filled 2013-01-13: qty 4
  Filled 2013-01-13 (×3): qty 2
  Filled 2013-01-13: qty 3
  Filled 2013-01-13: qty 4

## 2013-01-13 MED ORDER — ZOLPIDEM TARTRATE 5 MG PO TABS
5.0000 mg | ORAL_TABLET | Freq: Every evening | ORAL | Status: DC | PRN
  Administered 2013-01-15: 5 mg via ORAL
  Filled 2013-01-13: qty 1

## 2013-01-13 MED ORDER — CEFAZOLIN SODIUM-DEXTROSE 2-3 GM-% IV SOLR
2.0000 g | INTRAVENOUS | Status: AC
  Administered 2013-01-13: 2 g via INTRAVENOUS

## 2013-01-13 MED ORDER — TRAMADOL HCL 50 MG PO TABS
50.0000 mg | ORAL_TABLET | Freq: Four times a day (QID) | ORAL | Status: DC | PRN
  Administered 2013-01-14 – 2013-01-15 (×2): 100 mg via ORAL
  Filled 2013-01-13 (×2): qty 2

## 2013-01-13 MED ORDER — FLECAINIDE ACETATE 100 MG PO TABS
100.0000 mg | ORAL_TABLET | Freq: Two times a day (BID) | ORAL | Status: DC
  Administered 2013-01-13 – 2013-01-15 (×4): 100 mg via ORAL
  Filled 2013-01-13 (×5): qty 1

## 2013-01-13 MED ORDER — SODIUM CHLORIDE 0.9 % IV SOLN
INTRAVENOUS | Status: DC
  Administered 2013-01-13: 17:00:00 via INTRAVENOUS

## 2013-01-13 MED ORDER — METOPROLOL SUCCINATE ER 25 MG PO TB24
25.0000 mg | ORAL_TABLET | Freq: Every morning | ORAL | Status: DC
  Administered 2013-01-14 – 2013-01-15 (×2): 25 mg via ORAL
  Filled 2013-01-13 (×2): qty 1

## 2013-01-13 MED ORDER — PROPOFOL 10 MG/ML IV EMUL
INTRAVENOUS | Status: DC | PRN
  Administered 2013-01-13: 120 ug/kg/min via INTRAVENOUS

## 2013-01-13 MED ORDER — PROMETHAZINE HCL 25 MG/ML IJ SOLN: 6.2500 mg | INTRAMUSCULAR | Status: DC | PRN

## 2013-01-13 MED ORDER — DIPHENHYDRAMINE HCL 12.5 MG/5ML PO ELIX
12.5000 mg | ORAL_SOLUTION | ORAL | Status: DC | PRN
Start: 2013-01-13 — End: 2013-01-15

## 2013-01-13 MED ORDER — SODIUM CHLORIDE 0.9 % IV SOLN: INTRAVENOUS | Status: DC

## 2013-01-13 MED ORDER — PHENOL 1.4 % MT LIQD: 1.0000 | OROMUCOSAL | Status: DC | PRN

## 2013-01-13 MED ORDER — METHOCARBAMOL 100 MG/ML IJ SOLN: 500.0000 mg | Freq: Four times a day (QID) | INTRAVENOUS | Status: DC | PRN

## 2013-01-13 MED ORDER — 0.9 % SODIUM CHLORIDE (POUR BTL) OPTIME
TOPICAL | Status: DC | PRN
  Administered 2013-01-13: 1000 mL

## 2013-01-13 MED ORDER — METHOCARBAMOL 500 MG PO TABS
500.0000 mg | ORAL_TABLET | Freq: Four times a day (QID) | ORAL | Status: DC | PRN
  Administered 2013-01-13 – 2013-01-14 (×2): 500 mg via ORAL
  Filled 2013-01-13 (×2): qty 1

## 2013-01-13 MED ORDER — FENTANYL CITRATE 0.05 MG/ML IJ SOLN: 25.0000 ug | INTRAMUSCULAR | Status: DC | PRN

## 2013-01-13 MED ORDER — POLYETHYLENE GLYCOL 3350 17 G PO PACK: 17.0000 g | PACK | Freq: Every day | ORAL | Status: DC | PRN

## 2013-01-13 MED ORDER — EPHEDRINE SULFATE 50 MG/ML IJ SOLN
INTRAMUSCULAR | Status: DC | PRN
  Administered 2013-01-13: 10 mg via INTRAVENOUS
  Administered 2013-01-13: 5 mg via INTRAVENOUS
  Administered 2013-01-13: 10 mg via INTRAVENOUS

## 2013-01-13 MED ORDER — SODIUM CHLORIDE 0.9 % IJ SOLN
INTRAMUSCULAR | Status: DC | PRN
  Administered 2013-01-13: 15:00:00

## 2013-01-13 MED ORDER — CHLORHEXIDINE GLUCONATE 4 % EX LIQD: 60.0000 mL | Freq: Once | CUTANEOUS | Status: DC

## 2013-01-13 MED ORDER — ACETAMINOPHEN 10 MG/ML IV SOLN
1000.0000 mg | Freq: Four times a day (QID) | INTRAVENOUS | Status: AC
  Administered 2013-01-13 – 2013-01-14 (×4): 1000 mg via INTRAVENOUS
  Filled 2013-01-13 (×6): qty 100

## 2013-01-13 MED ORDER — METOCLOPRAMIDE HCL 10 MG PO TABS: 5.0000 mg | ORAL_TABLET | Freq: Three times a day (TID) | ORAL | Status: DC | PRN

## 2013-01-13 MED ORDER — ONDANSETRON HCL 4 MG/2ML IJ SOLN
INTRAMUSCULAR | Status: DC | PRN
  Administered 2013-01-13: 4 mg via INTRAVENOUS

## 2013-01-13 MED ORDER — DOCUSATE SODIUM 100 MG PO CAPS
100.0000 mg | ORAL_CAPSULE | Freq: Two times a day (BID) | ORAL | Status: DC
  Administered 2013-01-13 – 2013-01-15 (×4): 100 mg via ORAL

## 2013-01-13 MED ORDER — DEXAMETHASONE SODIUM PHOSPHATE 10 MG/ML IJ SOLN
10.0000 mg | Freq: Every day | INTRAMUSCULAR | Status: AC
  Administered 2013-01-14: 10 mg via INTRAVENOUS
  Filled 2013-01-13: qty 1

## 2013-01-13 MED ORDER — BUPIVACAINE LIPOSOME 1.3 % IJ SUSP
20.0000 mL | Freq: Once | INTRAMUSCULAR | Status: DC
  Filled 2013-01-13: qty 20

## 2013-01-13 MED ORDER — FLEET ENEMA 7-19 GM/118ML RE ENEM: 1.0000 | ENEMA | Freq: Once | RECTAL | Status: AC | PRN

## 2013-01-13 MED ORDER — LIDOCAINE HCL (CARDIAC) 20 MG/ML IV SOLN
INTRAVENOUS | Status: DC | PRN
  Administered 2013-01-13: 100 mg via INTRAVENOUS

## 2013-01-13 MED ORDER — ONDANSETRON HCL 4 MG/2ML IJ SOLN: 4.0000 mg | Freq: Four times a day (QID) | INTRAMUSCULAR | Status: DC | PRN

## 2013-01-13 MED ORDER — BUPIVACAINE IN DEXTROSE 0.75-8.25 % IT SOLN
INTRATHECAL | Status: DC | PRN
  Administered 2013-01-13: 1.7 mL via INTRATHECAL

## 2013-01-13 MED ORDER — CEFAZOLIN SODIUM-DEXTROSE 2-3 GM-% IV SOLR
2.0000 g | Freq: Four times a day (QID) | INTRAVENOUS | Status: AC
  Administered 2013-01-13 – 2013-01-14 (×2): 2 g via INTRAVENOUS
  Filled 2013-01-13 (×2): qty 50

## 2013-01-13 MED ORDER — HYDROMORPHONE HCL PF 1 MG/ML IJ SOLN
0.5000 mg | INTRAMUSCULAR | Status: DC | PRN
  Administered 2013-01-13: 1 mg via INTRAVENOUS
  Filled 2013-01-13: qty 1

## 2013-01-13 MED ORDER — LOSARTAN POTASSIUM 50 MG PO TABS
50.0000 mg | ORAL_TABLET | Freq: Every morning | ORAL | Status: DC
  Administered 2013-01-14 – 2013-01-15 (×2): 50 mg via ORAL
  Filled 2013-01-13 (×2): qty 1

## 2013-01-13 MED ORDER — MEPERIDINE HCL 50 MG/ML IJ SOLN: 6.2500 mg | INTRAMUSCULAR | Status: DC | PRN

## 2013-01-13 MED ORDER — LACTATED RINGERS IV SOLN: INTRAVENOUS | Status: DC

## 2013-01-13 SURGICAL SUPPLY — 54 items
BAG SPEC THK2 15X12 ZIP CLS (MISCELLANEOUS) ×1
BAG ZIPLOCK 12X15 (MISCELLANEOUS) ×2 IMPLANT
BANDAGE ELASTIC 6 VELCRO ST LF (GAUZE/BANDAGES/DRESSINGS) ×2 IMPLANT
BANDAGE ESMARK 6X9 LF (GAUZE/BANDAGES/DRESSINGS) ×1 IMPLANT
BLADE SAG 18X100X1.27 (BLADE) ×2 IMPLANT
BLADE SAW SGTL 11.0X1.19X90.0M (BLADE) ×2 IMPLANT
BNDG CMPR 9X6 STRL LF SNTH (GAUZE/BANDAGES/DRESSINGS) ×1
BNDG ESMARK 6X9 LF (GAUZE/BANDAGES/DRESSINGS) ×2
BOWL SMART MIX CTS (DISPOSABLE) ×2 IMPLANT
CEMENT HV SMART SET (Cement) ×4 IMPLANT
CLOTH BEACON ORANGE TIMEOUT ST (SAFETY) ×2 IMPLANT
CUFF TOURN SGL QUICK 34 (TOURNIQUET CUFF) ×2
CUFF TRNQT CYL 34X4X40X1 (TOURNIQUET CUFF) ×1 IMPLANT
DRAPE EXTREMITY T 121X128X90 (DRAPE) ×2 IMPLANT
DRAPE POUCH INSTRU U-SHP 10X18 (DRAPES) ×2 IMPLANT
DRAPE U-SHAPE 47X51 STRL (DRAPES) ×2 IMPLANT
DRSG ADAPTIC 3X8 NADH LF (GAUZE/BANDAGES/DRESSINGS) ×2 IMPLANT
DURAPREP 26ML APPLICATOR (WOUND CARE) ×2 IMPLANT
ELECT REM PT RETURN 9FT ADLT (ELECTROSURGICAL) ×2
ELECTRODE REM PT RTRN 9FT ADLT (ELECTROSURGICAL) ×1 IMPLANT
EVACUATOR 1/8 PVC DRAIN (DRAIN) ×2 IMPLANT
FACESHIELD LNG OPTICON STERILE (SAFETY) ×10 IMPLANT
GLOVE BIO SURGEON STRL SZ7.5 (GLOVE) ×2 IMPLANT
GLOVE BIO SURGEON STRL SZ8 (GLOVE) ×3 IMPLANT
GLOVE BIOGEL PI IND STRL 8 (GLOVE) ×2 IMPLANT
GLOVE BIOGEL PI INDICATOR 8 (GLOVE) ×2
GLOVE SURG SS PI 6.5 STRL IVOR (GLOVE) ×4 IMPLANT
GOWN STRL NON-REIN LRG LVL3 (GOWN DISPOSABLE) ×4 IMPLANT
GOWN STRL REIN XL XLG (GOWN DISPOSABLE) ×2 IMPLANT
HANDPIECE INTERPULSE COAX TIP (DISPOSABLE) ×2
IMMOBILIZER KNEE 20 (SOFTGOODS) ×2
IMMOBILIZER KNEE 20 THIGH 36 (SOFTGOODS) ×1 IMPLANT
KIT BASIN OR (CUSTOM PROCEDURE TRAY) ×2 IMPLANT
MANIFOLD NEPTUNE II (INSTRUMENTS) ×2 IMPLANT
NDL SAFETY ECLIPSE 18X1.5 (NEEDLE) ×1 IMPLANT
NEEDLE HYPO 18GX1.5 SHARP (NEEDLE) ×2
NS IRRIG 1000ML POUR BTL (IV SOLUTION) ×2 IMPLANT
PACK TOTAL JOINT (CUSTOM PROCEDURE TRAY) ×2 IMPLANT
PAD ABD 7.5X8 STRL (GAUZE/BANDAGES/DRESSINGS) ×2 IMPLANT
PADDING CAST COTTON 6X4 STRL (CAST SUPPLIES) ×6 IMPLANT
POSITIONER SURGICAL ARM (MISCELLANEOUS) ×2 IMPLANT
SET HNDPC FAN SPRY TIP SCT (DISPOSABLE) ×1 IMPLANT
SPONGE GAUZE 4X4 12PLY (GAUZE/BANDAGES/DRESSINGS) ×2 IMPLANT
STRIP CLOSURE SKIN 1/2X4 (GAUZE/BANDAGES/DRESSINGS) ×4 IMPLANT
SUCTION FRAZIER 12FR DISP (SUCTIONS) ×2 IMPLANT
SUT MNCRL AB 4-0 PS2 18 (SUTURE) ×2 IMPLANT
SUT VIC AB 2-0 CT1 27 (SUTURE) ×6
SUT VIC AB 2-0 CT1 TAPERPNT 27 (SUTURE) ×3 IMPLANT
SUT VLOC 180 0 24IN GS25 (SUTURE) ×2 IMPLANT
SYR 50ML LL SCALE MARK (SYRINGE) ×2 IMPLANT
TOWEL OR 17X26 10 PK STRL BLUE (TOWEL DISPOSABLE) ×4 IMPLANT
TRAY FOLEY CATH 14FRSI W/METER (CATHETERS) ×2 IMPLANT
WATER STERILE IRR 1500ML POUR (IV SOLUTION) ×2 IMPLANT
WRAP KNEE MAXI GEL POST OP (GAUZE/BANDAGES/DRESSINGS) ×4 IMPLANT

## 2013-01-13 NOTE — Anesthesia Procedure Notes (Signed)
Spinal  Patient location during procedure: OR Staffing Anesthesiologist: Teng Decou Performed by: anesthesiologist  Preanesthetic Checklist Completed: patient identified, site marked, surgical consent, pre-op evaluation, timeout performed, IV checked, risks and benefits discussed and monitors and equipment checked Spinal Block Patient position: sitting Prep: Betadine Patient monitoring: heart rate, continuous pulse ox and blood pressure Approach: right paramedian Location: L3-4 Injection technique: single-shot Needle Needle type: Spinocan  Needle gauge: 22 G Needle length: 9 cm Additional Notes Expiration date of kit checked and confirmed. Patient tolerated procedure well, without complications.     

## 2013-01-13 NOTE — Transfer of Care (Signed)
Immediate Anesthesia Transfer of Care Note  Patient: [name]  Procedure(s) Performed: Procedure(s): TOTAL KNEE ARTHROPLASTY (Right)  Patient Location: PACU  Anesthesia Type:MAC and Spinal  Level of Consciousness: awake, alert , oriented and patient cooperative  Airway & Oxygen Therapy: Patient Spontanous Breathing and Patient connected to face mask oxygen  Post-op Assessment: Report given to PACU RN and Post -op Vital signs reviewed and stable  Post vital signs: Reviewed and stable  Complications: No apparent anesthesia complications

## 2013-01-13 NOTE — Op Note (Signed)
Pre-operative diagnosis- Osteoarthritis  Right knee(s)  Post-operative diagnosis- Osteoarthritis Right knee(s)  Procedure-  Right  Total Knee Arthroplasty  Surgeon- Gus Rankin. Marlaine Arey, MD  Assistant- Leilani Able, Pa-C   Anesthesia-  Spinal EBL-* No blood loss amount entered *  Drains Hemovac  Tourniquet time-  Total Tourniquet Time Documented: Thigh (Right) - 35 minutes Total: Thigh (Right) - 35 minutes    Complications- None  Condition-PACU - hemodynamically stable.   Brief Clinical Note  [name] is a 67 y.o. year old female with end stage OA of her right knee with progressively worsening pain and dysfunction. She has constant pain, with activity and at rest and significant functional deficits with difficulties even with ADLs. She has had extensive non-op management including analgesics, injections of cortisone and viscosupplements, and home exercise program, but remains in significant pain with significant dysfunction.Radiographs show bone on bone arthritis medial and patellofemoral. She presents now for right Total Knee Arthroplasty.    Procedure in detail---   The patient is brought into the operating room and positioned supine on the operating table. After successful administration of  Spinal,   a tourniquet is placed high on the  Right thigh(s) and the lower extremity is prepped and draped in the usual sterile fashion. Time out is performed by the operating team and then the  Right lower extremity is wrapped in Esmarch, knee flexed and the tourniquet inflated to 300 mmHg.       A midline incision is made with a ten blade through the subcutaneous tissue to the level of the extensor mechanism. A fresh blade is used to make a medial parapatellar arthrotomy. Soft tissue over the proximal medial tibia is subperiosteally elevated to the joint line with a knife and into the semimembranosus bursa with a Cobb elevator. Soft tissue over the proximal lateral tibia is elevated with  attention being paid to avoiding the patellar tendon on the tibial tubercle. The patella is everted, knee flexed 90 degrees and the ACL and PCL are removed. Findings are bone on bone medial and patellofemoral with large medial osteophytes.        The drill is used to create a starting hole in the distal femur and the canal is thoroughly irrigated with sterile saline to remove the fatty contents. The 5 degree Right  valgus alignment guide is placed into the femoral canal and the distal femoral cutting block is pinned to remove 10 mm off the distal femur. Resection is made with an oscillating saw.      The tibia is subluxed forward and the menisci are removed. The extramedullary alignment guide is placed referencing proximally at the medial aspect of the tibial tubercle and distally along the second metatarsal axis and tibial crest. The block is pinned to remove 2mm off the more deficient medial  side. Resection is made with an oscillating saw. Size 3is the most appropriate size for the tibia and the proximal tibia is prepared with the modular drill and keel punch for that size.      The femoral sizing guide is placed and size 3 is most appropriate. Rotation is marked off the epicondylar axis and confirmed by creating a rectangular flexion gap at 90 degrees. The size 3 cutting block is pinned in this rotation and the anterior, posterior and chamfer cuts are made with the oscillating saw. The intercondylar block is then placed and that cut is made.      Trial size 3 tibial component, trial size 3 posterior  stabilized femur and a 10  mm posterior stabilized rotating platform insert trial is placed. Full extension is achieved with excellent varus/valgus and anterior/posterior balance throughout full range of motion. The patella is everted and thickness measured to be 22  mm. Free hand resection is taken to 12 mm, a 38 template is placed, lug holes are drilled, trial patella is placed, and it tracks normally.  Osteophytes are removed off the posterior femur with the trial in place. All trials are removed and the cut bone surfaces prepared with pulsatile lavage. Cement is mixed and once ready for implantation, the size 3 tibial implant, size  3 posterior stabilized femoral component, and the size 38 patella are cemented in place and the patella is held with the clamp. The trial insert is placed and the knee held in full extension. The Exparel (20 ml mixed with 50 ml saline) is injected into the extensor mechanism, posterior capsule, medial and lateral gutters and subcutaneous tissues.  All extruded cement is removed and once the cement is hard the permanent 10 mm posterior stabilized rotating platform insert is placed into the tibial tray.      The wound is copiously irrigated with saline solution and the extensor mechanism closed over a hemovac drain with #1 PDS suture. The tourniquet is released for a total tourniquet time of 35  minutes. Flexion against gravity is 140 degrees and the patella tracks normally. Subcutaneous tissue is closed with 2.0 vicryl and subcuticular with running 4.0 Monocryl. The incision is cleaned and dried and steri-strips and a bulky sterile dressing are applied. The limb is placed into a knee immobilizer and the patient is awakened and transported to recovery in stable condition.      Please note that a surgical assistant was a medical necessity for this procedure in order to perform it in a safe and expeditious manner. Surgical assistant was necessary to retract the ligaments and vital neurovascular structures to prevent injury to them and also necessary for proper positioning of the limb to allow for anatomic placement of the prosthesis.   Gus Rankin Curley Hogen, MD    01/13/2013, 2:40 PM

## 2013-01-13 NOTE — Preoperative (Signed)
Beta Blockers   Reason not to administer Beta Blockers:Not Applicable Pt took Beta Blocker today 

## 2013-01-13 NOTE — Anesthesia Preprocedure Evaluation (Addendum)
Anesthesia Evaluation  Patient identified by MRN, date of birth, ID band Patient awake    Reviewed: Allergy & Precautions, H&P , NPO status , Patient's Chart, lab work & pertinent test results  Airway Mallampati: II TM Distance: >3 FB Neck ROM: Full    Dental  (+) Edentulous Upper and Upper Dentures   Pulmonary neg sleep apnea,  breath sounds clear to auscultation        Cardiovascular hypertension, Pt. on home beta blockers + dysrhythmias Atrial Fibrillation Rhythm:Regular Rate:Normal     Neuro/Psych    GI/Hepatic   Endo/Other    Renal/GU      Musculoskeletal   Abdominal (+) + obese,   Peds  Hematology   Anesthesia Other Findings   Reproductive/Obstetrics                          Anesthesia Physical  Anesthesia Plan  ASA: III  Anesthesia Plan: Spinal   Post-op Pain Management:    Induction:   Airway Management Planned: Simple Face Mask  Additional Equipment:   Intra-op Plan:   Post-operative Plan: Extubation in OR  Informed Consent: I have reviewed the patients History and Physical, chart, labs and discussed the procedure including the risks, benefits and alternatives for the proposed anesthesia with the patient or authorized representative who has indicated his/her understanding and acceptance.   Dental advisory given  Plan Discussed with: CRNA, Surgeon and Anesthesiologist  Anesthesia Plan Comments:        Anesthesia Quick Evaluation

## 2013-01-13 NOTE — Anesthesia Postprocedure Evaluation (Signed)
  Anesthesia Post-op Note  Patient: [name]  Procedure(s) Performed: Procedure(s) (LRB): TOTAL KNEE ARTHROPLASTY (Right)  Patient Location: PACU  Anesthesia Type: Spinal  Level of Consciousness: awake and alert   Airway and Oxygen Therapy: Patient Spontanous Breathing  Post-op Pain: mild  Post-op Assessment: Post-op Vital signs reviewed, Patient's Cardiovascular Status Stable, Respiratory Function Stable, Patent Airway and No signs of Nausea or vomiting  Last Vitals:  Filed Vitals:   01/13/13 1042  BP: 140/78  Pulse: 58  Temp: 36.5 C  Resp: 16    Post-op Vital Signs: stable   Complications: No apparent anesthesia complications

## 2013-01-13 NOTE — Interval H&P Note (Signed)
History and Physical Interval Note:  01/13/2013 1:08 PM  [name]  has presented today for surgery, with the diagnosis of Osteoarthritis of the Right Knee  The various methods of treatment have been discussed with the patient and family. After consideration of risks, benefits and other options for treatment, the patient has consented to  Procedure(s): TOTAL KNEE ARTHROPLASTY (Right) as a surgical intervention .  The patient's history has been reviewed, patient examined, no change in status, stable for surgery.  I have reviewed the patient's chart and labs.  Questions were answered to the patient's satisfaction.     Loanne Drilling

## 2013-01-14 ENCOUNTER — Encounter (HOSPITAL_COMMUNITY): Payer: Self-pay | Admitting: Orthopedic Surgery

## 2013-01-14 LAB — CBC
MCH: 29.2 pg (ref 26.0–34.0)
MCHC: 34.7 g/dL (ref 30.0–36.0)
MCV: 84.4 fL (ref 78.0–100.0)
Platelets: 223 10*3/uL (ref 150–400)
RDW: 13.1 % (ref 11.5–15.5)
WBC: 11.3 10*3/uL — ABNORMAL HIGH (ref 4.0–10.5)

## 2013-01-14 LAB — BASIC METABOLIC PANEL
Calcium: 8.8 mg/dL (ref 8.4–10.5)
Creatinine, Ser: 0.57 mg/dL (ref 0.50–1.10)
GFR calc Af Amer: >90 mL/min (ref >90)
GFR calc non Af Amer: >90 mL/min (ref >90)

## 2013-01-14 MED ORDER — TRAMADOL HCL 50 MG PO TABS: 50.0000 mg | ORAL_TABLET | Freq: Four times a day (QID) | ORAL | Status: DC | PRN

## 2013-01-14 MED ORDER — RIVAROXABAN 10 MG PO TABS: 10.0000 mg | ORAL_TABLET | Freq: Every day | ORAL | Status: DC

## 2013-01-14 MED ORDER — METHOCARBAMOL 500 MG PO TABS: 500.0000 mg | ORAL_TABLET | Freq: Four times a day (QID) | ORAL | Status: DC | PRN

## 2013-01-14 MED ORDER — OXYCODONE HCL 5 MG PO TABS: 5.0000 mg | ORAL_TABLET | ORAL | Status: DC | PRN

## 2013-01-14 NOTE — Evaluation (Signed)
Occupational Therapy Evaluation and Discharge Summary Patient Details Name: [name] MRN: [id number] DOB: 03-01-1946 Today's Date: 01/14/2013 Time: 1191-4782 OT Time Calculation (min): 23 min  OT Assessment / Plan / Recommendation Clinical Impression  Pt is a 67 yo female admittted for R TKA who is doing very well with adls post knee surgery.  Pt is not in need of further acute OT and will have assist from husband at d/c.    OT Assessment  Patient does not need any further OT services    Follow Up Recommendations  No OT follow up    Barriers to Discharge      Equipment Recommendations  None recommended by OT    Recommendations for Other Services    Frequency       Precautions / Restrictions Precautions Precautions: Knee Required Braces or Orthoses: Knee Immobilizer - Right Knee Immobilizer - Right: Discontinue once straight leg raise with < 10 degree lag Restrictions Weight Bearing Restrictions: No   Pertinent Vitals/Pain Pt with 3/10 pain in R knee.    ADL  Eating/Feeding: Performed;Independent Where Assessed - Eating/Feeding: Chair Grooming: Performed;Wash/dry hands;Wash/dry face;Supervision/safety Where Assessed - Grooming: Supported standing Upper Body Bathing: Simulated;Set up Where Assessed - Upper Body Bathing: Unsupported sitting Lower Body Bathing: Simulated;Minimal assistance Where Assessed - Lower Body Bathing: Supported sit to stand Upper Body Dressing: Performed;Set up Where Assessed - Upper Body Dressing: Unsupported sitting Lower Body Dressing: Performed;Minimal assistance Where Assessed - Lower Body Dressing: Supported sit to stand Toilet Transfer: Research scientist (life sciences) Method: Sit to stand;Stand Wellsite geologist: Raised toilet seat with arms (or 3-in-1 over toilet) Toileting - Clothing Manipulation and Hygiene: Performed;Supervision/safety Where Assessed - Engineer, mining and Hygiene: Sit  to stand from 3-in-1 or toilet Equipment Used: Rolling walker;Knee Immobilizer Transfers/Ambulation Related to ADLs: Pt walked in room to bathroom all with S. ADL Comments: Pt does well with adls.  Still requires assist to get to R sock and shoe at this tme.    OT Diagnosis:    OT Problem List:   OT Treatment Interventions:     OT Goals    Visit Information  Last OT Received On: 01/14/13 Assistance Needed: +1    Subjective Data  Subjective: I feel good today. Patient Stated Goal: to get home   Prior Functioning     Home Living Lives With: Spouse Available Help at Discharge: Available 24 hours/day;Family Type of Home: House Home Access: Stairs to enter [employer] of Steps: 6 Entrance Stairs-Rails: Left Home Layout: One level;Other (Comment) (1 step down in house to get to sunroom) Bathroom Shower/Tub: Other (comment) (sponge bathes by choice.) Bathroom Toilet: Handicapped height Bathroom Accessibility: Yes How Accessible: Accessible via walker Home Adaptive Equipment: None Prior Function Level of Independence: Independent Able to Take Stairs?: Yes Driving: Yes Vocation: Retired Musician: No difficulties Dominant Hand: Right         Vision/Perception Vision - History Baseline Vision: No visual deficits Patient Visual Report: No change from baseline Vision - Assessment Vision Assessment: Vision not tested   [employer] Arousal/Alertness: Awake/alert Behavior During Therapy: WFL for tasks assessed/performed Overall Cognitive Status: Within Functional Limits for tasks assessed    Extremity/Trunk Assessment Right Upper Extremity Assessment RUE ROM/Strength/Tone: Within functional levels RUE Sensation: WFL - Light Touch RUE Coordination: WFL - gross/fine motor Left Upper Extremity Assessment LUE ROM/Strength/Tone: Within functional levels LUE Sensation: WFL - Light Touch LUE Coordination: WFL - gross/fine  motor Trunk Assessment Trunk Assessment: Normal  Mobility Transfers Transfers: Sit to Stand;Stand to Sit Sit to Stand: 5: Supervision;With armrests;From chair/3-in-1 Stand to Sit: 5: Supervision;To chair/3-in-1;With armrests Details for Transfer Assistance: Cues to reach back to chair before sitting.     Exercise     Balance     End of Session    GO     [name] 01/14/2013, 11:18 AM [phone]

## 2013-01-14 NOTE — Progress Notes (Signed)
Physical Therapy Treatment Patient Details Name: SARHA BARTELT MRN: [id number] DOB: 11/11/1945 Today's Date: 01/14/2013 Time: 7829-5621 PT Time Calculation (min): 27 min  PT Assessment / Plan / Recommendation Comments on Treatment Session       Follow Up Recommendations  Home health PT     Does the patient have the potential to tolerate intense rehabilitation     Barriers to Discharge        Equipment Recommendations  Rolling walker with 5" wheels    Recommendations for Other Services OT consult  Frequency 7X/week   Plan Discharge plan remains appropriate    Precautions / Restrictions Precautions Precautions: Knee Required Braces or Orthoses: Knee Immobilizer - Right Knee Immobilizer - Right: Discontinue once straight leg raise with < 10 degree lag Restrictions Weight Bearing Restrictions: No   Pertinent Vitals/Pain 5/10; pt premedicated; Rn providing additional pain meds    Mobility  Bed Mobility Bed Mobility: Supine to Sit;Sit to Supine Supine to Sit: 4: Min assist Sit to Supine: 4: Min assist Details for Bed Mobility Assistance: cues for sequence and min assist for R LE Transfers Transfers: Sit to Stand;Stand to Sit Sit to Stand: 4: Min guard;From bed Stand to Sit: 4: Min guard;To bed Details for Transfer Assistance: Cues to reach back to chair before sitting. Ambulation/Gait Ambulation/Gait Assistance: 4: Min guard Ambulation Distance (Feet): 147 Feet Assistive device: Rolling walker Ambulation/Gait Assistance Details: cues for posture, sequence and position from RW Gait Pattern: Step-to pattern;Decreased step length - right;Decreased step length - left;Decreased stance time - left    Exercises Total Joint Exercises Ankle Circles/Pumps: AROM;15 reps;Supine;Both Quad Sets: AROM;10 reps;Supine;Both Heel Slides: AAROM;10 reps;Supine;Right Straight Leg Raises: AAROM;Right;10 reps;Supine   PT Diagnosis:    PT Problem List:   PT Treatment Interventions:      PT Goals Acute Rehab PT Goals PT Goal Formulation: With patient Time For Goal Achievement: 01/20/13 Potential to Achieve Goals: Good Pt will go Supine/Side to Sit: with supervision PT Goal: Supine/Side to Sit - Progress: Goal set today Pt will go Sit to Supine/Side: with supervision PT Goal: Sit to Supine/Side - Progress: Goal set today Pt will go Sit to Stand: with supervision PT Goal: Sit to Stand - Progress: Goal set today Pt will go Stand to Sit: with supervision PT Goal: Stand to Sit - Progress: Goal set today Pt will Ambulate: 51 - 150 feet;with supervision;with rolling walker PT Goal: Ambulate - Progress: Goal set today Pt will Go Up / Down Stairs: 6-9 stairs;with min assist;with least restrictive assistive device PT Goal: Up/Down Stairs - Progress: Goal set today  Visit Information  Last PT Received On: 01/14/13 Assistance Needed: +1    Subjective Data  Patient Stated Goal: Resume previous lifestyle with decreased pain   Cognition  Cognition Arousal/Alertness: Awake/alert Behavior During Therapy: WFL for tasks assessed/performed Overall Cognitive Status: Within Functional Limits for tasks assessed    Balance     End of Session PT - End of Session Equipment Utilized During Treatment: Right knee immobilizer;Gait belt Activity Tolerance: Patient tolerated treatment well Patient left: in bed;with call bell/phone within reach;with family/visitor present Nurse Communication: Mobility status   GP     Tifani Dack 01/14/2013, 4:33 PM
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
## 2013-01-14 NOTE — Progress Notes (Signed)
Pt chose [facility] to provide HHPT services, referral made. Spouse will be the care-giver.  Algernon Huxley RN BSN  [phone]

## 2013-01-14 NOTE — Evaluation (Signed)
Physical Therapy Evaluation Patient Details Name: [name] MRN: [id number] DOB: 07-Dec-1945 Today's Date: 01/14/2013 Time: 4098-1191 PT Time Calculation (min): 24 min  PT Assessment / Plan / Recommendation Clinical Impression  Pt s/p R TKR presents with decreased R LE strength/ROM and post op pain limiting functional mobility    PT Assessment  Patient needs continued PT services    Follow Up Recommendations  Home health PT    Does the patient have the potential to tolerate intense rehabilitation      Barriers to Discharge None      Equipment Recommendations  Rolling walker with 5" wheels    Recommendations for Other Services OT consult   Frequency 7X/week    Precautions / Restrictions Precautions Precautions: Knee Required Braces or Orthoses: Knee Immobilizer - Right Knee Immobilizer - Right: Discontinue once straight leg raise with < 10 degree lag Restrictions Weight Bearing Restrictions: No   Pertinent Vitals/Pain 3-4/10; RN providing MEds, cold pack provided      Mobility  Transfers Transfers: Sit to Stand;Stand to Sit Sit to Stand: 5: Supervision;With armrests;From chair/3-in-1 Stand to Sit: 5: Supervision;To chair/3-in-1;With armrests Details for Transfer Assistance: Cues to reach back to chair before sitting. Ambulation/Gait Ambulation/Gait Assistance: 4: Min assist Ambulation Distance (Feet): 95 Feet (and 15) Assistive device: Rolling walker Ambulation/Gait Assistance Details: cues for posture, sequence, position from RW and stride length Gait Pattern: Step-to pattern;Decreased step length - right;Decreased step length - left;Decreased stance time - left    Exercises     PT Diagnosis: Difficulty walking  PT Problem List: Decreased strength;Decreased range of motion;Decreased activity tolerance;Decreased mobility;Pain;Decreased knowledge of use of DME PT Treatment Interventions: DME instruction;Gait training;Stair training;Functional mobility  training;Therapeutic activities;Therapeutic exercise;Patient/family education   PT Goals Acute Rehab PT Goals PT Goal Formulation: With patient Time For Goal Achievement: 01/20/13 Potential to Achieve Goals: Good Pt will go Supine/Side to Sit: with supervision PT Goal: Supine/Side to Sit - Progress: Goal set today Pt will go Sit to Supine/Side: with supervision PT Goal: Sit to Supine/Side - Progress: Goal set today Pt will go Sit to Stand: with supervision PT Goal: Sit to Stand - Progress: Goal set today Pt will go Stand to Sit: with supervision PT Goal: Stand to Sit - Progress: Goal set today Pt will Ambulate: 51 - 150 feet;with supervision;with rolling walker PT Goal: Ambulate - Progress: Goal set today Pt will Go Up / Down Stairs: 6-9 stairs;with min assist;with least restrictive assistive device PT Goal: Up/Down Stairs - Progress: Goal set today  Visit Information  Last PT Received On: 01/14/13 Assistance Needed: +1    Subjective Data  Subjective: I need to use the bathroom Patient Stated Goal: Resume previous lifestyle with decreased pain   Prior Functioning  Home Living Lives With: Spouse Available Help at Discharge: Available 24 hours/day;Family Type of Home: House Home Access: Stairs to enter [employer] of Steps: 6 Entrance Stairs-Rails: Left Home Layout: One level;Other (Comment) (1 step down in house to get to sunroom) Bathroom Shower/Tub: Other (comment) (sponge bathes by choice.) Bathroom Toilet: Handicapped height Bathroom Accessibility: Yes How Accessible: Accessible via walker Home Adaptive Equipment: None Prior Function Level of Independence: Independent Able to Take Stairs?: Yes Driving: Yes Vocation: Retired Musician: No difficulties Dominant Hand: Right    Cognition  Cognition Arousal/Alertness: Awake/alert Behavior During Therapy: WFL for tasks assessed/performed Overall Cognitive Status: Within Functional  Limits for tasks assessed    Extremity/Trunk Assessment Right Upper Extremity Assessment RUE ROM/Strength/Tone: Within functional levels RUE  Sensation: WFL - Light Touch RUE Coordination: WFL - gross/fine motor Left Upper Extremity Assessment LUE ROM/Strength/Tone: Within functional levels LUE Sensation: WFL - Light Touch LUE Coordination: WFL - gross/fine motor Right Lower Extremity Assessment RLE ROM/Strength/Tone: Unable to fully assess Left Lower Extremity Assessment LLE ROM/Strength/Tone: WFL for tasks assessed Trunk Assessment Trunk Assessment: Normal   Balance    End of Session PT - End of Session Equipment Utilized During Treatment: Right knee immobilizer;Gait belt Activity Tolerance: Patient tolerated treatment well Patient left: in chair;with call bell/phone within reach;with family/visitor present Nurse Communication: Mobility status  GP     Jamie Belger 01/14/2013, 12:00 PM

## 2013-01-14 NOTE — Progress Notes (Signed)
Utilization review completed.  

## 2013-01-15 LAB — BASIC METABOLIC PANEL
BUN: 10 mg/dL (ref 6–23)
Calcium: 9.6 mg/dL (ref 8.4–10.5)
GFR calc non Af Amer: >90 mL/min (ref >90)
Glucose, Bld: 163 mg/dL — ABNORMAL HIGH (ref 70–99)

## 2013-01-15 LAB — CBC
HCT: 29.2 % — ABNORMAL LOW (ref 36.0–46.0)
Hemoglobin: 10.3 g/dL — ABNORMAL LOW (ref 12.0–15.0)
MCH: 29.8 pg (ref 26.0–34.0)
MCHC: 35.3 g/dL (ref 30.0–36.0)

## 2013-01-15 MED ORDER — RIVAROXABAN 20 MG PO TABS: 20.0000 mg | ORAL_TABLET | Freq: Every day | ORAL | Status: DC

## 2013-01-15 MED ORDER — RIVAROXABAN 10 MG PO TABS: 20.0000 mg | ORAL_TABLET | Freq: Every day | ORAL | Status: DC
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
## 2013-01-15 NOTE — Progress Notes (Signed)
Physical Therapy Treatment Patient Details Name: [name] MRN: [id number] DOB: [birth year] Today's Date: 01/15/2013 Time: 1478-2956 PT Time Calculation (min): 38 min  PT Assessment / Plan / Recommendation Comments on Treatment Session       Follow Up Recommendations  Home health PT     Does the patient have the potential to tolerate intense rehabilitation     Barriers to Discharge        Equipment Recommendations  Rolling walker with 5" wheels    Recommendations for Other Services OT consult  Frequency 7X/week   Plan Discharge plan remains appropriate    Precautions / Restrictions Precautions Precautions: Knee Required Braces or Orthoses: Knee Immobilizer - Right Knee Immobilizer - Right: Discontinue once straight leg raise with < 10 degree lag (Pt performed IND SLR this am) Restrictions Weight Bearing Restrictions: No   Pertinent Vitals/Pain 5/10; premed, ice packs provided.    Mobility  Bed Mobility Bed Mobility: Supine to Sit;Sit to Supine Supine to Sit: 4: Min guard Sit to Supine: 4: Min guard Details for Bed Mobility Assistance: cues for sequence and min assist for R LE Transfers Transfers: Sit to Stand;Stand to Sit Sit to Stand: 4: Min guard Stand to Sit: 4: Min guard Details for Transfer Assistance: Cues to reach back to chair before sitting. Ambulation/Gait Ambulation/Gait Assistance: 4: Min guard;5: Supervision Ambulation Distance (Feet): 123 Feet Assistive device: Rolling walker Ambulation/Gait Assistance Details: cues for initial sequence, posture, position from RW Gait Pattern: Step-to pattern;Decreased step length - right;Decreased step length - left;Decreased stance time - left Stairs: Yes Stairs Assistance: 4: Min assist Stairs Assistance Details (indicate cue type and reason): cues for sequence, foot/crutch placement; spouse assisted on second attempt Stair Management Technique: One rail Left;Step to pattern;Forwards;With crutches Number  of Stairs: 8 (4 twice)    Exercises Total Joint Exercises Ankle Circles/Pumps: AROM;15 reps;Supine;Both Quad Sets: AROM;Supine;Both;20 reps Heel Slides: AAROM;Supine;Right;15 reps Straight Leg Raises: AAROM;Right;Supine;AROM;20 reps   PT Diagnosis:    PT Problem List:   PT Treatment Interventions:     PT Goals Acute Rehab PT Goals PT Goal Formulation: With patient Time For Goal Achievement: 01/20/13 Potential to Achieve Goals: Good Pt will go Supine/Side to Sit: with supervision PT Goal: Supine/Side to Sit - Progress: Progressing toward goal Pt will go Sit to Supine/Side: with supervision PT Goal: Sit to Supine/Side - Progress: Progressing toward goal Pt will go Sit to Stand: with supervision PT Goal: Sit to Stand - Progress: Progressing toward goal Pt will go Stand to Sit: with supervision PT Goal: Stand to Sit - Progress: Progressing toward goal Pt will Ambulate: 51 - 150 feet;with supervision;with rolling walker PT Goal: Ambulate - Progress: Met Pt will Go Up / Down Stairs: 6-9 stairs;with min assist;with least restrictive assistive device PT Goal: Up/Down Stairs - Progress: Met  Visit Information  Last PT Received On: 01/15/13 Assistance Needed: +1    Subjective Data  Subjective: I am leaving today Patient Stated Goal: Resume previous lifestyle with decreased pain   Cognition  Cognition Arousal/Alertness: Awake/alert Behavior During Therapy: WFL for tasks assessed/performed Overall Cognitive Status: Within Functional Limits for tasks assessed    Balance     End of Session PT - End of Session Equipment Utilized During Treatment: Gait belt Activity Tolerance: Patient tolerated treatment well Patient left: in chair;with call bell/phone within reach;with family/visitor present Nurse Communication: Mobility status CPM Right Knee CPM Right Knee: Off   GP     [name] 01/15/2013, 11:59 AM
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
## 2013-01-15 NOTE — Progress Notes (Signed)
Pt to d/c home with [facility] home health. DME delivered to pt before d/c. AVS reviewed and "My Chart" discussed with pt. Pt capable of verbalizing medications and follow-up appointments. Remains hemodynamically stable. No signs and symptoms of distress. Educated pt to return to ER in the case of SOB, dizziness, or chest pain.

## 2013-01-15 NOTE — Plan of Care (Signed)
Problem: Consults Goal: Diagnosis- Total Joint Replacement Outcome: Completed/Met Date Met:  01/15/13 Primary Total Knee RIGHT

## 2013-01-25 ENCOUNTER — Ambulatory Visit (HOSPITAL_COMMUNITY)
Admission: RE | Admit: 2013-01-25 | Discharge: 2013-01-25 | Disposition: A | Discharge status: Home / Self Care | Payer: Medicare Other | Source: Ambulatory Visit | Attending: Cardiovascular Disease | Admitting: Cardiovascular Disease

## 2013-01-25 ENCOUNTER — Other Ambulatory Visit (HOSPITAL_COMMUNITY): Payer: Self-pay | Admitting: *Deleted

## 2013-01-25 ENCOUNTER — Other Ambulatory Visit (HOSPITAL_COMMUNITY): Payer: Self-pay | Admitting: Orthopedic Surgery

## 2013-01-25 DIAGNOSIS — M7989 Other specified soft tissue disorders: Secondary | ICD-10-CM | POA: Insufficient documentation

## 2013-01-25 DIAGNOSIS — M25561 Pain in right knee: Secondary | ICD-10-CM

## 2013-01-25 DIAGNOSIS — M25569 Pain in unspecified knee: Secondary | ICD-10-CM | POA: Insufficient documentation

## 2013-01-25 NOTE — Progress Notes (Signed)
Right Lower Ext. Venous Duplex Completed. Negative for DVT. [name]

## 2013-02-16 ENCOUNTER — Encounter: Payer: Self-pay | Admitting: Cardiology

## 2013-02-25 ENCOUNTER — Telehealth: Payer: Self-pay | Admitting: Cardiology

## 2013-02-25 NOTE — Telephone Encounter (Signed)
Received request from Nurse, documents faxed for surgical clearance. To: [facility] Fax number: [phone] Attention:Wendy Caton 02/25/13/KM

## 2013-04-02 ENCOUNTER — Other Ambulatory Visit: Payer: Self-pay | Admitting: Cardiology

## 2013-04-02 NOTE — Telephone Encounter (Signed)
Unable to reach patient to verify dosing of Losartan. Rx request is asking 50mg  once daily while last OV says 25mg  daily. Was originally 50mg  and was reduced to 25mg  per Dr Shirlee Latch 10/06/12. Will forward this to Dr Shirlee Latch and nurse for further review.    Floy Angert [employer]

## 2013-04-07 ENCOUNTER — Other Ambulatory Visit: Payer: Self-pay | Admitting: Family Medicine

## 2013-04-07 ENCOUNTER — Other Ambulatory Visit: Payer: Self-pay

## 2013-04-07 DIAGNOSIS — Z1231 Encounter for screening mammogram for malignant neoplasm of breast: Secondary | ICD-10-CM

## 2013-04-13 ENCOUNTER — Ambulatory Visit
Admission: RE | Admit: 2013-04-13 | Discharge: 2013-04-13 | Disposition: A | Discharge status: Home / Self Care | Payer: Medicare Other | Source: Ambulatory Visit

## 2013-04-13 DIAGNOSIS — Z1231 Encounter for screening mammogram for malignant neoplasm of breast: Secondary | ICD-10-CM

## 2013-04-19 NOTE — Progress Notes (Signed)
Surgery scheduled for 05/03/13.  Need orders in EPIC.  Thank You.

## 2013-04-20 ENCOUNTER — Other Ambulatory Visit: Payer: Self-pay | Admitting: Orthopedic Surgery

## 2013-04-20 NOTE — Progress Notes (Signed)
Preoperative surgical orders have been place into the Epic hospital system for [name] on 04/20/2013, 5:13 PM  by Patrica Duel for surgery on 05/03/2013.  Preop Total Knee orders including Experal, PO Tylenol, and IV Decadron as long as there are no contraindications to the above medications. Avel Peace, PA-C

## 2013-04-22 ENCOUNTER — Encounter (HOSPITAL_COMMUNITY): Payer: Self-pay | Admitting: Pharmacy Technician

## 2013-04-23 NOTE — Progress Notes (Signed)
Surgery clearance note Dr. Shirlee Latch 11/23/12 on chart, chest x-ray 06/17/12 on EPIC, EKG 12/17/12 on EPIC

## 2013-04-23 NOTE — Patient Instructions (Addendum)
20 CANDEE HOON  04/23/2013   Your procedure is scheduled on: 05/03/13  Report to [facility] at 7:30 AM.  Call this number if you have problems the morning of surgery [phone]: [phone]   Remember:   Do not eat food or drink liquids After Midnight.     Take these medicines the morning of surgery with A SIP OF WATER: flecainide, hydrocodone if needed, metoprolol    Do not wear jewelry, make-up or nail polish.  Do not wear lotions, powders, or perfumes. You may wear deodorant.  Do not shave 48 hours prior to surgery. Men may shave face and neck.  Do not bring valuables to the hospital.  Contacts, dentures or bridgework may not be worn into surgery.  Leave suitcase in the car. After surgery it may be brought to your room.  For patients admitted to the hospital, checkout time is 11:00 AM the day of discharge.    Please read over the following fact sheets that you were given: MRSA Information, incentive spirometry fact sheet, blood fact sheet Birdie Sons, RN  pre op nurse call if needed [phone]    FAILURE TO FOLLOW THESE INSTRUCTIONS MAY RESULT IN CANCELLATION OF YOUR SURGERY   Patient Signature: ___________________________________________

## 2013-04-26 ENCOUNTER — Encounter (HOSPITAL_COMMUNITY): Payer: Self-pay

## 2013-04-26 ENCOUNTER — Encounter (HOSPITAL_COMMUNITY)
Admission: RE | Admit: 2013-04-26 | Discharge: 2013-04-26 | Disposition: A | Discharge status: Home / Self Care | Payer: Medicare Other | Source: Ambulatory Visit | Attending: Orthopedic Surgery | Admitting: Orthopedic Surgery

## 2013-04-26 DIAGNOSIS — G4730 Sleep apnea, unspecified: Secondary | ICD-10-CM | POA: Insufficient documentation

## 2013-04-26 DIAGNOSIS — Z01812 Encounter for preprocedural laboratory examination: Secondary | ICD-10-CM | POA: Insufficient documentation

## 2013-04-26 DIAGNOSIS — I10 Essential (primary) hypertension: Secondary | ICD-10-CM | POA: Insufficient documentation

## 2013-04-26 DIAGNOSIS — M1710 Unilateral primary osteoarthritis, unspecified knee: Secondary | ICD-10-CM | POA: Insufficient documentation

## 2013-04-26 HISTORY — DX: Diverticulosis of intestine, part unspecified, without perforation or abscess without bleeding: K57.90

## 2013-04-26 HISTORY — DX: Pneumonia, unspecified organism: J18.9

## 2013-04-26 HISTORY — DX: Gastro-esophageal reflux disease without esophagitis: K21.9

## 2013-04-26 LAB — COMPREHENSIVE METABOLIC PANEL
ALT: 19 U/L (ref 0–35)
AST: 19 U/L (ref 0–37)
Albumin: 3.8 g/dL (ref 3.5–5.2)
Alkaline Phosphatase: 96 U/L (ref 39–117)
BUN: 14 mg/dL (ref 6–23)
CO2: 30 mEq/L (ref 19–32)
Calcium: 9.9 mg/dL (ref 8.4–10.5)
Chloride: 98 mEq/L (ref 96–112)
Creatinine, Ser: 0.62 mg/dL (ref 0.50–1.10)
GFR calc Af Amer: >90 mL/min (ref >90)
GFR calc non Af Amer: >90 mL/min (ref >90)
Glucose, Bld: 117 mg/dL — ABNORMAL HIGH (ref 70–99)
Potassium: 4.6 mEq/L (ref 3.5–5.1)
Sodium: 136 mEq/L (ref 135–145)
Total Bilirubin: 0.3 mg/dL (ref 0.3–1.2)
Total Protein: 7.6 g/dL (ref 6.0–8.3)

## 2013-04-26 LAB — CBC
HCT: 35.6 % — ABNORMAL LOW (ref 36.0–46.0)
Hemoglobin: 11.7 g/dL — ABNORMAL LOW (ref 12.0–15.0)
MCH: 28.3 pg (ref 26.0–34.0)
MCHC: 32.9 g/dL (ref 30.0–36.0)
MCV: 86.2 fL (ref 78.0–100.0)
Platelets: 280 10*3/uL (ref 150–400)
RBC: 4.13 MIL/uL (ref 3.87–5.11)
RDW: 13.9 % (ref 11.5–15.5)
WBC: 6.1 10*3/uL (ref 4.0–10.5)

## 2013-04-26 LAB — URINALYSIS, ROUTINE W REFLEX MICROSCOPIC
Bilirubin Urine: NEGATIVE
Glucose, UA: NEGATIVE mg/dL
Hgb urine dipstick: NEGATIVE
Ketones, ur: NEGATIVE mg/dL
Leukocytes, UA: NEGATIVE
Nitrite: NEGATIVE
Protein, ur: NEGATIVE mg/dL
Specific Gravity, Urine: 1.012 (ref 1.005–1.030)
Urobilinogen, UA: 0.2 mg/dL (ref 0.0–1.0)
pH: 6 (ref 5.0–8.0)

## 2013-04-26 LAB — PROTIME-INR
INR: 1.14 (ref 0.00–1.49)
Prothrombin Time: 14.4 seconds (ref 11.6–15.2)

## 2013-04-26 LAB — APTT: aPTT: 33 seconds (ref 24–37)

## 2013-04-26 NOTE — Progress Notes (Signed)
04/26/13 0840  OBSTRUCTIVE SLEEP APNEA  Have you ever been diagnosed with sleep apnea through a sleep study? No  Do you snore loudly (loud enough to be heard through closed doors)?  1  Do you often feel tired, fatigued, or sleepy during the daytime? 0  Has anyone observed you stop breathing during your sleep? 1  Do you have, or are you being treated for high blood pressure? 1  BMI more than 35 kg/m2? 1  Age over 66 years old? 1  Neck circumference greater than 40 cm/18 inches? 0  Gender: 0  Obstructive Sleep Apnea Score 5  Score 4 or greater  Results sent to PCP

## 2013-04-28 ENCOUNTER — Ambulatory Visit: Payer: Medicare Other | Admitting: Cardiology
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
## 2013-05-03 ENCOUNTER — Encounter (HOSPITAL_COMMUNITY): Payer: Self-pay | Admitting: *Deleted

## 2013-05-03 ENCOUNTER — Inpatient Hospital Stay (HOSPITAL_COMMUNITY)
Admission: RE | Admit: 2013-05-03 | Discharge: 2013-05-05 | DRG: 470 | Disposition: A | Discharge status: Home Health | Payer: Medicare Other | Source: Ambulatory Visit | Attending: Orthopedic Surgery | Admitting: Orthopedic Surgery

## 2013-05-03 ENCOUNTER — Inpatient Hospital Stay (HOSPITAL_COMMUNITY): Payer: Medicare Other | Admitting: *Deleted

## 2013-05-03 ENCOUNTER — Encounter (HOSPITAL_COMMUNITY)
Admission: RE | Disposition: A | Discharge status: Home Health | Payer: Self-pay | Source: Ambulatory Visit | Attending: Orthopedic Surgery

## 2013-05-03 DIAGNOSIS — G4733 Obstructive sleep apnea (adult) (pediatric): Secondary | ICD-10-CM | POA: Diagnosis present

## 2013-05-03 DIAGNOSIS — E669 Obesity, unspecified: Secondary | ICD-10-CM | POA: Diagnosis present

## 2013-05-03 DIAGNOSIS — Z01812 Encounter for preprocedural laboratory examination: Secondary | ICD-10-CM

## 2013-05-03 DIAGNOSIS — Z79899 Other long term (current) drug therapy: Secondary | ICD-10-CM

## 2013-05-03 DIAGNOSIS — D62 Acute posthemorrhagic anemia: Secondary | ICD-10-CM | POA: Diagnosis not present

## 2013-05-03 DIAGNOSIS — I10 Essential (primary) hypertension: Secondary | ICD-10-CM | POA: Diagnosis present

## 2013-05-03 DIAGNOSIS — Z6837 Body mass index (BMI) 37.0-37.9, adult: Secondary | ICD-10-CM

## 2013-05-03 DIAGNOSIS — M1710 Unilateral primary osteoarthritis, unspecified knee: Secondary | ICD-10-CM

## 2013-05-03 DIAGNOSIS — E871 Hypo-osmolality and hyponatremia: Secondary | ICD-10-CM | POA: Diagnosis not present

## 2013-05-03 DIAGNOSIS — Z96652 Presence of left artificial knee joint: Secondary | ICD-10-CM

## 2013-05-03 DIAGNOSIS — K219 Gastro-esophageal reflux disease without esophagitis: Secondary | ICD-10-CM | POA: Diagnosis present

## 2013-05-03 DIAGNOSIS — I4891 Unspecified atrial fibrillation: Secondary | ICD-10-CM | POA: Diagnosis present

## 2013-05-03 HISTORY — PX: TOTAL KNEE ARTHROPLASTY: SHX125

## 2013-05-03 LAB — TYPE AND SCREEN
ABO/RH(D): O POS
Antibody Screen: NEGATIVE

## 2013-05-03 SURGERY — ARTHROPLASTY, KNEE, TOTAL
Anesthesia: Spinal | Site: Knee | Laterality: Left | Wound class: Clean

## 2013-05-03 MED ORDER — RIVAROXABAN 10 MG PO TABS
10.0000 mg | ORAL_TABLET | Freq: Every day | ORAL | Status: DC
  Administered 2013-05-04 – 2013-05-05 (×2): 10 mg via ORAL
  Filled 2013-05-03 (×4): qty 1

## 2013-05-03 MED ORDER — PHENOL 1.4 % MT LIQD: 1.0000 | OROMUCOSAL | Status: DC | PRN

## 2013-05-03 MED ORDER — BUPIVACAINE LIPOSOME 1.3 % IJ SUSP
INTRAMUSCULAR | Status: DC | PRN
  Administered 2013-05-03: 20 mL

## 2013-05-03 MED ORDER — CEFAZOLIN SODIUM 1-5 GM-% IV SOLN
1.0000 g | Freq: Four times a day (QID) | INTRAVENOUS | Status: AC
  Administered 2013-05-03 (×2): 1 g via INTRAVENOUS
  Filled 2013-05-03 (×2): qty 50

## 2013-05-03 MED ORDER — STERILE WATER FOR IRRIGATION IR SOLN
Status: DC | PRN
  Administered 2013-05-03: 3000 mL

## 2013-05-03 MED ORDER — HYDROMORPHONE HCL PF 1 MG/ML IJ SOLN
0.2500 mg | INTRAMUSCULAR | Status: DC | PRN
Start: 2013-05-03 — End: 2013-05-03

## 2013-05-03 MED ORDER — ONDANSETRON HCL 4 MG/2ML IJ SOLN: 4.0000 mg | Freq: Four times a day (QID) | INTRAMUSCULAR | Status: DC | PRN

## 2013-05-03 MED ORDER — BISACODYL 10 MG RE SUPP: 10.0000 mg | Freq: Every day | RECTAL | Status: DC | PRN

## 2013-05-03 MED ORDER — HYDROMORPHONE HCL 2 MG PO TABS
2.0000 mg | ORAL_TABLET | ORAL | Status: DC | PRN
  Administered 2013-05-03 – 2013-05-05 (×8): 2 mg via ORAL
  Filled 2013-05-03 (×6): qty 1
  Filled 2013-05-03: qty 2
  Filled 2013-05-03: qty 1

## 2013-05-03 MED ORDER — ACETAMINOPHEN 500 MG PO TABS
1000.0000 mg | ORAL_TABLET | Freq: Four times a day (QID) | ORAL | Status: AC
  Administered 2013-05-03: 1000 mg via ORAL
  Filled 2013-05-03: qty 2

## 2013-05-03 MED ORDER — DIPHENHYDRAMINE HCL 12.5 MG/5ML PO ELIX: 12.5000 mg | ORAL_SOLUTION | ORAL | Status: DC | PRN

## 2013-05-03 MED ORDER — BUPIVACAINE IN DEXTROSE 0.75-8.25 % IT SOLN
INTRATHECAL | Status: DC | PRN
  Administered 2013-05-03: 1.5 mL via INTRATHECAL

## 2013-05-03 MED ORDER — METOPROLOL SUCCINATE ER 25 MG PO TB24
25.0000 mg | ORAL_TABLET | Freq: Every morning | ORAL | Status: DC
  Administered 2013-05-04: 25 mg via ORAL
  Filled 2013-05-03 (×2): qty 1

## 2013-05-03 MED ORDER — BUPIVACAINE LIPOSOME 1.3 % IJ SUSP
20.0000 mL | Freq: Once | INTRAMUSCULAR | Status: DC
  Filled 2013-05-03: qty 20

## 2013-05-03 MED ORDER — POLYETHYLENE GLYCOL 3350 17 G PO PACK: 17.0000 g | PACK | Freq: Every day | ORAL | Status: DC | PRN

## 2013-05-03 MED ORDER — METOCLOPRAMIDE HCL 10 MG PO TABS: 5.0000 mg | ORAL_TABLET | Freq: Three times a day (TID) | ORAL | Status: DC | PRN

## 2013-05-03 MED ORDER — ACETAMINOPHEN 500 MG PO TABS
1000.0000 mg | ORAL_TABLET | Freq: Once | ORAL | Status: AC
  Administered 2013-05-03: 1000 mg via ORAL
  Filled 2013-05-03: qty 2

## 2013-05-03 MED ORDER — HYDROMORPHONE HCL PF 1 MG/ML IJ SOLN
0.5000 mg | INTRAMUSCULAR | Status: DC | PRN
  Administered 2013-05-03: 0.5 mg via INTRAVENOUS
  Filled 2013-05-03: qty 1

## 2013-05-03 MED ORDER — PROMETHAZINE HCL 25 MG/ML IJ SOLN: 6.2500 mg | INTRAMUSCULAR | Status: DC | PRN

## 2013-05-03 MED ORDER — ONDANSETRON HCL 4 MG/2ML IJ SOLN
INTRAMUSCULAR | Status: DC | PRN
  Administered 2013-05-03: 4 mg via INTRAVENOUS

## 2013-05-03 MED ORDER — PROPOFOL INFUSION 10 MG/ML OPTIME
INTRAVENOUS | Status: DC | PRN
  Administered 2013-05-03: 100 ug/kg/min via INTRAVENOUS

## 2013-05-03 MED ORDER — BUPIVACAINE HCL 0.25 % IJ SOLN
INTRAMUSCULAR | Status: DC | PRN
  Administered 2013-05-03: 20 mL

## 2013-05-03 MED ORDER — DEXAMETHASONE SODIUM PHOSPHATE 10 MG/ML IJ SOLN
10.0000 mg | Freq: Once | INTRAMUSCULAR | Status: AC
  Administered 2013-05-03: 10 mg via INTRAVENOUS

## 2013-05-03 MED ORDER — DEXAMETHASONE SODIUM PHOSPHATE 10 MG/ML IJ SOLN
10.0000 mg | Freq: Every day | INTRAMUSCULAR | Status: AC
  Filled 2013-05-03: qty 1

## 2013-05-03 MED ORDER — FLECAINIDE ACETATE 100 MG PO TABS
100.0000 mg | ORAL_TABLET | Freq: Two times a day (BID) | ORAL | Status: DC
  Administered 2013-05-03 – 2013-05-05 (×4): 100 mg via ORAL
  Filled 2013-05-03 (×6): qty 1

## 2013-05-03 MED ORDER — CEFAZOLIN SODIUM-DEXTROSE 2-3 GM-% IV SOLR
2.0000 g | INTRAVENOUS | Status: AC
  Administered 2013-05-03: 2 g via INTRAVENOUS

## 2013-05-03 MED ORDER — FENTANYL CITRATE 0.05 MG/ML IJ SOLN
INTRAMUSCULAR | Status: DC | PRN
  Administered 2013-05-03: 100 ug via INTRAVENOUS

## 2013-05-03 MED ORDER — METOCLOPRAMIDE HCL 5 MG/ML IJ SOLN: 5.0000 mg | Freq: Three times a day (TID) | INTRAMUSCULAR | Status: DC | PRN

## 2013-05-03 MED ORDER — LACTATED RINGERS IV SOLN
INTRAVENOUS | Status: DC | PRN
  Administered 2013-05-03 (×2): via INTRAVENOUS

## 2013-05-03 MED ORDER — LOSARTAN POTASSIUM 50 MG PO TABS
50.0000 mg | ORAL_TABLET | Freq: Every morning | ORAL | Status: DC
  Administered 2013-05-03 – 2013-05-04 (×2): 50 mg via ORAL
  Filled 2013-05-03 (×3): qty 1

## 2013-05-03 MED ORDER — METHOCARBAMOL 100 MG/ML IJ SOLN
500.0000 mg | Freq: Four times a day (QID) | INTRAVENOUS | Status: DC | PRN
  Filled 2013-05-03: qty 5

## 2013-05-03 MED ORDER — MIDAZOLAM HCL 5 MG/5ML IJ SOLN
INTRAMUSCULAR | Status: DC | PRN
  Administered 2013-05-03: 1 mg via INTRAVENOUS
  Administered 2013-05-03: 2 mg via INTRAVENOUS
  Administered 2013-05-03: 1 mg via INTRAVENOUS

## 2013-05-03 MED ORDER — DEXAMETHASONE 6 MG PO TABS
10.0000 mg | ORAL_TABLET | Freq: Every day | ORAL | Status: AC
  Administered 2013-05-04: 10 mg via ORAL
  Filled 2013-05-03: qty 1

## 2013-05-03 MED ORDER — METHOCARBAMOL 500 MG PO TABS
500.0000 mg | ORAL_TABLET | Freq: Four times a day (QID) | ORAL | Status: DC | PRN
  Administered 2013-05-03 – 2013-05-05 (×4): 500 mg via ORAL
  Filled 2013-05-03 (×4): qty 1

## 2013-05-03 MED ORDER — 0.9 % SODIUM CHLORIDE (POUR BTL) OPTIME
TOPICAL | Status: DC | PRN
  Administered 2013-05-03: 1000 mL

## 2013-05-03 MED ORDER — MENTHOL 3 MG MT LOZG: 1.0000 | LOZENGE | OROMUCOSAL | Status: DC | PRN

## 2013-05-03 MED ORDER — ZOLPIDEM TARTRATE 5 MG PO TABS
5.0000 mg | ORAL_TABLET | Freq: Every evening | ORAL | Status: DC | PRN
  Administered 2013-05-03 – 2013-05-04 (×2): 5 mg via ORAL
  Filled 2013-05-03 (×2): qty 1

## 2013-05-03 MED ORDER — SODIUM CHLORIDE 0.9 % IV SOLN: INTRAVENOUS | Status: DC

## 2013-05-03 MED ORDER — EPHEDRINE SULFATE 50 MG/ML IJ SOLN
INTRAMUSCULAR | Status: DC | PRN
  Administered 2013-05-03: 10 mg via INTRAVENOUS
  Administered 2013-05-03: 5 mg via INTRAVENOUS

## 2013-05-03 MED ORDER — SODIUM CHLORIDE 0.9 % IV SOLN
INTRAVENOUS | Status: DC
  Administered 2013-05-03: 1000 mL via INTRAVENOUS
  Administered 2013-05-04: 06:00:00 via INTRAVENOUS

## 2013-05-03 MED ORDER — FLEET ENEMA 7-19 GM/118ML RE ENEM: 1.0000 | ENEMA | Freq: Once | RECTAL | Status: AC | PRN

## 2013-05-03 MED ORDER — PROPOFOL 10 MG/ML IV BOLUS
INTRAVENOUS | Status: DC | PRN
  Administered 2013-05-03 (×2): 10 mg via INTRAVENOUS

## 2013-05-03 MED ORDER — SODIUM CHLORIDE 0.9 % IJ SOLN
INTRAMUSCULAR | Status: DC | PRN
  Administered 2013-05-03: 30 mL via INTRAVENOUS

## 2013-05-03 MED ORDER — ONDANSETRON HCL 4 MG PO TABS: 4.0000 mg | ORAL_TABLET | Freq: Four times a day (QID) | ORAL | Status: DC | PRN

## 2013-05-03 MED ORDER — DOCUSATE SODIUM 100 MG PO CAPS
100.0000 mg | ORAL_CAPSULE | Freq: Two times a day (BID) | ORAL | Status: DC
  Administered 2013-05-03 – 2013-05-04 (×4): 100 mg via ORAL

## 2013-05-03 MED ORDER — TRAMADOL HCL 50 MG PO TABS: 50.0000 mg | ORAL_TABLET | Freq: Four times a day (QID) | ORAL | Status: DC | PRN

## 2013-05-03 SURGICAL SUPPLY — 59 items
BAG SPEC THK2 15X12 ZIP CLS (MISCELLANEOUS) ×1
BAG ZIPLOCK 12X15 (MISCELLANEOUS) ×2 IMPLANT
BANDAGE ELASTIC 6 VELCRO ST LF (GAUZE/BANDAGES/DRESSINGS) ×2 IMPLANT
BANDAGE ESMARK 6X9 LF (GAUZE/BANDAGES/DRESSINGS) ×1 IMPLANT
BLADE SAG 18X100X1.27 (BLADE) ×2 IMPLANT
BLADE SAW SGTL 11.0X1.19X90.0M (BLADE) ×2 IMPLANT
BNDG CMPR 9X6 STRL LF SNTH (GAUZE/BANDAGES/DRESSINGS) ×1
BNDG ESMARK 6X9 LF (GAUZE/BANDAGES/DRESSINGS) ×2
BOWL SMART MIX CTS (DISPOSABLE) ×2 IMPLANT
CAPT RP KNEE ×1 IMPLANT
CEMENT HV SMART SET (Cement) ×4 IMPLANT
CLOSURE STERI-STRIP 1/4X4 (GAUZE/BANDAGES/DRESSINGS) ×1 IMPLANT
CLOTH BEACON ORANGE TIMEOUT ST (SAFETY) ×2 IMPLANT
CUFF TOURN SGL QUICK 34 (TOURNIQUET CUFF) ×2
CUFF TRNQT CYL 34X4X40X1 (TOURNIQUET CUFF) ×1 IMPLANT
DECANTER SPIKE VIAL GLASS SM (MISCELLANEOUS) ×2 IMPLANT
DRAPE EXTREMITY T 121X128X90 (DRAPE) ×2 IMPLANT
DRAPE POUCH INSTRU U-SHP 10X18 (DRAPES) ×2 IMPLANT
DRAPE U-SHAPE 47X51 STRL (DRAPES) ×2 IMPLANT
DRSG ADAPTIC 3X8 NADH LF (GAUZE/BANDAGES/DRESSINGS) ×2 IMPLANT
DRSG EMULSION OIL 3X16 NADH (GAUZE/BANDAGES/DRESSINGS) ×1 IMPLANT
DRSG PAD ABDOMINAL 8X10 ST (GAUZE/BANDAGES/DRESSINGS) ×2 IMPLANT
DURAPREP 26ML APPLICATOR (WOUND CARE) ×2 IMPLANT
ELECT REM PT RETURN 9FT ADLT (ELECTROSURGICAL) ×2
ELECTRODE REM PT RTRN 9FT ADLT (ELECTROSURGICAL) ×1 IMPLANT
EVACUATOR 1/8 PVC DRAIN (DRAIN) ×2 IMPLANT
FACESHIELD LNG OPTICON STERILE (SAFETY) ×10 IMPLANT
GLOVE BIO SURGEON STRL SZ7.5 (GLOVE) IMPLANT
GLOVE BIO SURGEON STRL SZ8 (GLOVE) ×2 IMPLANT
GLOVE BIOGEL PI IND STRL 8 (GLOVE) ×2 IMPLANT
GLOVE BIOGEL PI INDICATOR 8 (GLOVE) ×2
GLOVE SURG SS PI 6.5 STRL IVOR (GLOVE) IMPLANT
GOWN STRL NON-REIN LRG LVL3 (GOWN DISPOSABLE) ×2 IMPLANT
GOWN STRL REIN XL XLG (GOWN DISPOSABLE) IMPLANT
HANDPIECE INTERPULSE COAX TIP (DISPOSABLE) ×2
IMMOBILIZER KNEE 20 (SOFTGOODS) ×2
IMMOBILIZER KNEE 20 THIGH 36 (SOFTGOODS) ×1 IMPLANT
KIT BASIN OR (CUSTOM PROCEDURE TRAY) ×2 IMPLANT
MANIFOLD NEPTUNE II (INSTRUMENTS) ×2 IMPLANT
NDL SAFETY ECLIPSE 18X1.5 (NEEDLE) ×2 IMPLANT
NEEDLE HYPO 18GX1.5 SHARP (NEEDLE) ×4
NS IRRIG 1000ML POUR BTL (IV SOLUTION) ×2 IMPLANT
PACK TOTAL JOINT (CUSTOM PROCEDURE TRAY) ×2 IMPLANT
PADDING CAST COTTON 6X4 STRL (CAST SUPPLIES) ×5 IMPLANT
POSITIONER SURGICAL ARM (MISCELLANEOUS) ×2 IMPLANT
SET HNDPC FAN SPRY TIP SCT (DISPOSABLE) ×1 IMPLANT
SPONGE GAUZE 4X4 12PLY (GAUZE/BANDAGES/DRESSINGS) ×2 IMPLANT
STRIP CLOSURE SKIN 1/2X4 (GAUZE/BANDAGES/DRESSINGS) ×4 IMPLANT
SUCTION FRAZIER 12FR DISP (SUCTIONS) ×2 IMPLANT
SUT MNCRL AB 4-0 PS2 18 (SUTURE) ×2 IMPLANT
SUT VIC AB 2-0 CT1 27 (SUTURE) ×6
SUT VIC AB 2-0 CT1 TAPERPNT 27 (SUTURE) ×3 IMPLANT
SUT VLOC 180 0 24IN GS25 (SUTURE) ×2 IMPLANT
SYR 20CC LL (SYRINGE) ×2 IMPLANT
SYR 50ML LL SCALE MARK (SYRINGE) ×2 IMPLANT
TOWEL OR 17X26 10 PK STRL BLUE (TOWEL DISPOSABLE) ×4 IMPLANT
TRAY FOLEY CATH 14FRSI W/METER (CATHETERS) ×2 IMPLANT
WATER STERILE IRR 1500ML POUR (IV SOLUTION) ×2 IMPLANT
WRAP KNEE MAXI GEL POST OP (GAUZE/BANDAGES/DRESSINGS) ×2 IMPLANT

## 2013-05-03 NOTE — Transfer of Care (Signed)
Immediate Anesthesia Transfer of Care Note  Patient: [name]  Procedure(s) Performed: Procedure(s): LEFT TOTAL KNEE ARTHROPLASTY (Left)  Patient Location: PACU  Anesthesia Type:Regional  Level of Consciousness: awake, alert  and oriented  Airway & Oxygen Therapy: Patient Spontanous Breathing and Patient connected to face mask oxygen  Post-op Assessment: Report given to PACU RN and Post -op Vital signs reviewed and stable  Post vital signs: Reviewed and stable  Complications: No apparent anesthesia complications

## 2013-05-03 NOTE — Op Note (Signed)
Pre-operative diagnosis- Osteoarthritis  Left knee(s)  Post-operative diagnosis- Osteoarthritis Left knee(s)  Procedure-  Left  Total Knee Arthroplasty  Surgeon- Gus Rankin. Chriss Redel, MD  Assistant- Avel Peace, PA-C   Anesthesia-  Spinal EBL-* No blood loss amount entered *  Drains Hemovac  Tourniquet time-  Total Tourniquet Time Documented: Thigh (Left) - 29 minutes Total: Thigh (Left) - 29 minutes    Complications- None  Condition-PACU - hemodynamically stable.   Brief Clinical Note  [name] is a 67 y.o. year old female with end stage OA of her left knee with progressively worsening pain and dysfunction. She has constant pain, with activity and at rest and significant functional deficits with difficulties even with ADLs. She has had extensive non-op management including analgesics, injections of cortisone and viscosupplements, and home exercise program, but remains in significant pain with significant dysfunction. Radiographs show bone on bone arthritis medial and patellofemoral. She presents now for left Total Knee Arthroplasty.    Procedure in detail---   The patient is brought into the operating room and positioned supine on the operating table. After successful administration of  Spinal,   a tourniquet is placed high on the left thigh(s) and the lower extremity is prepped and draped in the usual sterile fashion. Time out is performed by the operating team and then the  Left lower extremity is wrapped in Esmarch, knee flexed and the tourniquet inflated to 300 mmHg.       A midline incision is made with a ten blade through the subcutaneous tissue to the level of the extensor mechanism. A fresh blade is used to make a medial parapatellar arthrotomy. Soft tissue over the proximal medial tibia is subperiosteally elevated to the joint line with a knife and into the semimembranosus bursa with a Cobb elevator. Soft tissue over the proximal lateral tibia is elevated with attention  being paid to avoiding the patellar tendon on the tibial tubercle. The patella is everted, knee flexed 90 degrees and the ACL and PCL are removed. Findings are bone on bone medial and patellofemoral with large medial osteophytes.        The drill is used to create a starting hole in the distal femur and the canal is thoroughly irrigated with sterile saline to remove the fatty contents. The 5 degree Left  valgus alignment guide is placed into the femoral canal and the distal femoral cutting block is pinned to remove 10 mm off the distal femur. Resection is made with an oscillating saw.      The tibia is subluxed forward and the menisci are removed. The extramedullary alignment guide is placed referencing proximally at the medial aspect of the tibial tubercle and distally along the second metatarsal axis and tibial crest. The block is pinned to remove 2mm off the more deficient medial  side. Resection is made with an oscillating saw. Size 3is the most appropriate size for the tibia and the proximal tibia is prepared with the modular drill and keel punch for that size.      The femoral sizing guide is placed and size 3 is most appropriate. Rotation is marked off the epicondylar axis and confirmed by creating a rectangular flexion gap at 90 degrees. The size 3 cutting block is pinned in this rotation and the anterior, posterior and chamfer cuts are made with the oscillating saw. The intercondylar block is then placed and that cut is made.      Trial size 3 tibial component, trial size 3 posterior  stabilized femur and a 10  mm posterior stabilized rotating platform insert trial is placed. Full extension is achieved with excellent varus/valgus and anterior/posterior balance throughout full range of motion. The patella is everted and thickness measured to be 22  mm. Free hand resection is taken to 12 mm, a 38 template is placed, lug holes are drilled, trial patella is placed, and it tracks normally. Osteophytes are  removed off the posterior femur with the trial in place. All trials are removed and the cut bone surfaces prepared with pulsatile lavage. Cement is mixed and once ready for implantation, the size 3 tibial implant, size  3 posterior stabilized femoral component, and the size 38 patella are cemented in place and the patella is held with the clamp. The trial insert is placed and the knee held in full extension. The Exparel (20 ml mixed with 30 ml saline) and .25% Bupivicaine, are injected into the extensor mechanism, posterior capsule, medial and lateral gutters and subcutaneous tissues.  All extruded cement is removed and once the cement is hard the permanent 10 mm posterior stabilized rotating platform insert is placed into the tibial tray.      The wound is copiously irrigated with saline solution and the extensor mechanism closed over a hemovac drain with #1 PDS suture. The tourniquet is released for a total tourniquet time of 29  minutes. Flexion against gravity is 140 degrees and the patella tracks normally. Subcutaneous tissue is closed with 2.0 vicryl and subcuticular with running 4.0 Monocryl. The incision is cleaned and dried and steri-strips and a bulky sterile dressing are applied. The limb is placed into a knee immobilizer and the patient is awakened and transported to recovery in stable condition.      Please note that a surgical assistant was a medical necessity for this procedure in order to perform it in a safe and expeditious manner. Surgical assistant was necessary to retract the ligaments and vital neurovascular structures to prevent injury to them and also necessary for proper positioning of the limb to allow for anatomic placement of the prosthesis.   Gus Rankin Fumio Vandam, MD    05/03/2013, 11:58 AM

## 2013-05-03 NOTE — Evaluation (Signed)
Physical Therapy Evaluation Patient Details Name: [name] MRN: [id number] DOB: 1946-04-02 Today's Date: 05/03/2013 Time: 4098-1191 PT Time Calculation (min): 38 min  PT Assessment / Plan / Recommendation History of Present Illness  LTKA 05/03/13  Clinical Impression  Pt ambulated in hallway today. Pt plans top DC to home. Pt has DME. Pain < 4.Pt will benefit from PT in acute care.    PT Assessment  Patient needs continued PT services    Follow Up Recommendations  Home health PT    Does the patient have the potential to tolerate intense rehabilitation      Barriers to Discharge        Equipment Recommendations  None recommended by PT    Recommendations for Other Services     Frequency 7X/week    Precautions / Restrictions Precautions Precautions: Knee Required Braces or Orthoses: Knee Immobilizer - Left Restrictions Weight Bearing Restrictions: No   Pertinent Vitals/Pain 4, ice and meds had been given --l knee      Mobility  Bed Mobility Bed Mobility: Supine to Sit Supine to Sit: 4: Min assist Details for Bed Mobility Assistance: support of LLE Transfers Transfers: Sit to Stand;Stand to Sit Sit to Stand: From chair/3-in-1 Stand to Sit: To chair/3-in-1;With upper extremity assist Details for Transfer Assistance: cues for hand and LLE placement. Ambulation/Gait Ambulation/Gait Assistance: 4: Min assist Ambulation Distance (Feet): 60 Feet Assistive device: Rolling walker Ambulation/Gait Assistance Details: cues for sequnence Gait Pattern: Step-to pattern    Exercises Total Joint Exercises Quad Sets: AROM;Left;10 reps   PT Diagnosis: Difficulty walking;Acute pain  PT Problem List: Decreased strength;Decreased activity tolerance;Decreased mobility;Pain;Decreased knowledge of use of DME PT Treatment Interventions: DME instruction;Gait training;Stair training;Functional mobility training;Therapeutic activities;Therapeutic exercise;Patient/family  education     PT Goals(Current goals can be found in the care plan section) Acute Rehab PT Goals Patient Stated Goal: I want to walk without pain. PT Goal Formulation: With patient/family Time For Goal Achievement: 05/10/13 Potential to Achieve Goals: Good  Visit Information  Last PT Received On: 05/03/13 Assistance Needed: +2 History of Present Illness: LTKA 05/03/13       Prior Functioning  Home Living Family/patient expects to be discharged to:: Private residence Living Arrangements: Spouse/significant other Available Help at Discharge: Available 24 hours/day;Family Type of Home: House Home Access: Stairs to enter [employer] of Steps: 6 Entrance Stairs-Rails: Left Home Layout: One level;Other (Comment) Home Equipment: Walker - 2 wheels;Crutches;Bedside commode Prior Function Level of Independence: Independent Communication Communication: No difficulties    Cognition  Cognition Arousal/Alertness: Awake/alert Behavior During Therapy: WFL for tasks assessed/performed Overall Cognitive Status: Within Functional Limits for tasks assessed    Extremity/Trunk Assessment Upper Extremity Assessment Upper Extremity Assessment: Overall WFL for tasks assessed Lower Extremity Assessment Lower Extremity Assessment: LLE deficits/detail LLE Deficits / Details: able to perform a SLR,    Balance    End of Session PT - End of Session Equipment Utilized During Treatment: Left knee immobilizer Activity Tolerance: Patient tolerated treatment well Patient left: in chair;with call bell/phone within reach;with family/visitor present Nurse Communication: Mobility status CPM Left Knee CPM Left Knee: Off  GP     Rada Hay 05/03/2013, 6:25 PM

## 2013-05-03 NOTE — Interval H&P Note (Signed)
History and Physical Interval Note:  05/03/2013 9:50 AM  [name]  has presented today for surgery, with the diagnosis of OA OF LEFT KNEE  The various methods of treatment have been discussed with the patient and family. After consideration of risks, benefits and other options for treatment, the patient has consented to  Procedure(s): LEFT TOTAL KNEE ARTHROPLASTY (Left) as a surgical intervention .  The patient's history has been reviewed, patient examined, no change in status, stable for surgery.  I have reviewed the patient's chart and labs.  Questions were answered to the patient's satisfaction.     Loanne Drilling

## 2013-05-03 NOTE — Progress Notes (Signed)
Utilization review completed.  

## 2013-05-03 NOTE — Anesthesia Postprocedure Evaluation (Signed)
  Anesthesia Post-op Note  Patient: [name]  Procedure(s) Performed: Procedure(s) (LRB): LEFT TOTAL KNEE ARTHROPLASTY (Left)  Patient Location: PACU  Anesthesia Type: Spinal  Level of Consciousness: awake and alert   Airway and Oxygen Therapy: Patient Spontanous Breathing  Post-op Pain: mild  Post-op Assessment: Post-op Vital signs reviewed, Patient's Cardiovascular Status Stable, Respiratory Function Stable, Patent Airway and No signs of Nausea or vomiting  Last Vitals:  Filed Vitals:   05/03/13 1444  BP: 131/79  Pulse: 67  Temp: 36.4 C  Resp: 18    Post-op Vital Signs: stable   Complications: No apparent anesthesia complications

## 2013-05-03 NOTE — Anesthesia Procedure Notes (Signed)
Spinal  Patient location during procedure: OR End time: 05/03/2013 11:07 AM Staffing Anesthesiologist: Azell Der CRNA/Resident: Enriqueta Shutter Performed by: anesthesiologist  Preanesthetic Checklist Completed: patient identified, site marked, surgical consent, pre-op evaluation, timeout performed, IV checked, risks and benefits discussed and monitors and equipment checked Spinal Block Patient position: sitting Prep: Betadine Patient monitoring: heart rate, continuous pulse ox and blood pressure Approach: midline Location: L3-4 Injection technique: single-shot Needle Needle type: Spinocan  Needle gauge: 22 G Needle length: 9 cm Assessment Sensory level: T6 Additional Notes Expiration date of kit checked and confirmed. Patient tolerated procedure well, without complications.

## 2013-05-03 NOTE — Anesthesia Preprocedure Evaluation (Addendum)
Anesthesia Evaluation  Patient identified by MRN, date of birth, ID band Patient awake    Reviewed: Allergy & Precautions, H&P , NPO status , Patient's Chart, lab work & pertinent test results  Airway Mallampati: II TM Distance: >3 FB Neck ROM: Full    Dental no notable dental hx.    Pulmonary sleep apnea , pneumonia -,  breath sounds clear to auscultation  Pulmonary exam normal       Cardiovascular Exercise Tolerance: Good hypertension, Pt. on medications and Pt. on home beta blockers negative cardio ROS  + dysrhythmias Rhythm:Regular Rate:Normal     Neuro/Psych negative neurological ROS  negative psych ROS   GI/Hepatic Neg liver ROS, GERD-  ,  Endo/Other  negative endocrine ROS  Renal/GU negative Renal ROS  negative genitourinary   Musculoskeletal negative musculoskeletal ROS (+)   Abdominal (+) + obese,   Peds negative pediatric ROS (+)  Hematology negative hematology ROS (+)   Anesthesia Other Findings   Reproductive/Obstetrics negative OB ROS                           Anesthesia Physical Anesthesia Plan  ASA: III  Anesthesia Plan: Spinal   Post-op Pain Management:    Induction: Intravenous  Airway Management Planned:   Additional Equipment:   Intra-op Plan:   Post-operative Plan: Extubation in OR  Informed Consent: I have reviewed the patients History and Physical, chart, labs and discussed the procedure including the risks, benefits and alternatives for the proposed anesthesia with the patient or authorized representative who has indicated his/her understanding and acceptance.   Dental advisory given  Plan Discussed with: CRNA  Anesthesia Plan Comments: (Discussed risks/benefits of spinal including headache, backache, failure, bleeding, infection, and nerve damage. Patient consents to spinal. Questions answered. Coagulation studies and platelet count acceptable. Off  xarelto for greater than 48 hours.)       Anesthesia Quick Evaluation

## 2013-05-04 ENCOUNTER — Encounter (HOSPITAL_COMMUNITY): Payer: Self-pay | Admitting: Orthopedic Surgery

## 2013-05-04 DIAGNOSIS — E871 Hypo-osmolality and hyponatremia: Secondary | ICD-10-CM

## 2013-05-04 DIAGNOSIS — D62 Acute posthemorrhagic anemia: Secondary | ICD-10-CM

## 2013-05-04 LAB — BASIC METABOLIC PANEL
BUN: 12 mg/dL (ref 6–23)
Creatinine, Ser: 0.56 mg/dL (ref 0.50–1.10)
GFR calc Af Amer: >90 mL/min (ref >90)
GFR calc non Af Amer: >90 mL/min (ref >90)

## 2013-05-04 LAB — CBC
HCT: 28.8 % — ABNORMAL LOW (ref 36.0–46.0)
MCH: 27.8 pg (ref 26.0–34.0)
MCHC: 33.3 g/dL (ref 30.0–36.0)
MCV: 83.5 fL (ref 78.0–100.0)
RDW: 13.7 % (ref 11.5–15.5)

## 2013-05-04 MED ORDER — POLYSACCHARIDE IRON COMPLEX 150 MG PO CAPS
150.0000 mg | ORAL_CAPSULE | Freq: Every day | ORAL | Status: DC
  Administered 2013-05-04: 150 mg via ORAL
  Filled 2013-05-04 (×2): qty 1

## 2013-05-04 NOTE — Progress Notes (Signed)
OT Cancellation Note  Patient Details Name: CRYSTALANN KORF MRN: [id number] DOB: [birth year]   Cancelled Treatment:    Reason Eval/Treat Not Completed: OT screened, no needs identified, will sign off. Pt has all DME and had opposite knee done in April this year. Husband can help at d/c.  Lennox Laity [phone] 05/04/2013, 8:52 AM

## 2013-05-04 NOTE — Progress Notes (Signed)
Physical Therapy Treatment Patient Details Name: [name] MRN: [id number] DOB: 1946-06-07 Today's Date: 05/04/2013 Time: 1027-2536 PT Time Calculation (min): 12 min  PT Assessment / Plan / Recommendation  History of Present Illness LTKA 05/03/13   PT Comments   Instructed to WEAR KI FOR STAIRS WHEN GOES HOME. Was discouraged that lifting leg was more difficult than this AM. Pt reassured this is normal.pT was due meds and may have had more discomfort.  Follow Up Recommendations  Home health PT     Does the patient have the potential to tolerate intense rehabilitation     Barriers to Discharge        Equipment Recommendations  None recommended by PT    Recommendations for Other Services    Frequency 7X/week   Progress towards PT Goals Progress towards PT goals: Progressing toward goals  Plan Current plan remains appropriate    Precautions / Restrictions Precautions Precautions: Knee Required Braces or Orthoses: Knee Immobilizer - Left Knee Immobilizer - Left: Discontinue once straight leg raise with < 10 degree lag   Pertinent Vitals/Pain     Mobility  Bed Mobility Bed Mobility: Sit to Supine Supine to Sit: 5: Supervision Sit to Supine: 4: Min assist Details for Bed Mobility Assistance: support of LLE Transfers Sit to Stand: 5: Supervision;From bed Stand to Sit: To bed;5: Supervision Details for Transfer Assistance: cues for hand and LLE placement. Cues for slowing down, pt gets toomuch in [place]. lost balance while standing andputting on her shirt. Ambulation/Gait Ambulation/Gait Assistance: 4: Min guard Ambulation Distance (Feet): 50 Feet Assistive device: Rolling walker Ambulation/Gait Assistance Details: cues for step length and for safety, slow pace. Gait Pattern: Step-through pattern;Decreased stride length Stairs: Yes Stairs Assistance: 4: Min assist Stairs Assistance Details (indicate cue type and reason): frequent cues for technique, safety. Pt  tends to rush. Pt used 1 crutch and 1 rail Stair Management Technique: One rail Left;Forwards;With crutches Number of Stairs: 5    Exercises    PT Diagnosis:    PT Problem List:   PT Treatment Interventions:     PT Goals (current goals can now be found in the care plan section)    Visit Information  Last PT Received On: 05/04/13 Assistance Needed: +1 History of Present Illness: LTKA 05/03/13    Subjective Data      Cognition  Cognition Arousal/Alertness: Awake/alert    Balance     End of Session PT - End of Session Equipment Utilized During Treatment: Left knee immobilizer Activity Tolerance: Patient tolerated treatment well Patient left: with call bell/phone within reach;with family/visitor present;in bed Nurse Communication: Mobility status   GP     Rada Hay 05/04/2013, 3:56 PM

## 2013-05-04 NOTE — Care Management Note (Signed)
    Page 1 of 1   05/04/2013     1:34:29 PM   CARE MANAGEMENT NOTE 05/04/2013  Patient:  [name], [name]   Account Number:  [id number]  Date Initiated:  05/04/2013  Documentation initiated by:  Colleen Can  Subjective/Objective Assessment:   DX Osteoarthritis left knee; total knee replacemnt    Genevieve Norlander will provide HHpt with start date of da after discharge.     Action/Plan:   CM spoke with patient. Plans are for patient to return to her home in [place] where spouse will be caregiver. She already has DME and plans to use [place] for [facility] services.   Anticipated DC Date:  05/06/2013   Anticipated DC Plan:  HOME W HOME HEALTH SERVICES      DC Planning Services  CM consult      [facility]  HOME HEALTH   Choice offered to / List presented to:  C-1 Patient        HH arranged  HH-2 PT      [facility] agency  [facility]   Status of service:  Completed, signed off Medicare Important Message given?  NA - LOS <3 / Initial given by admissions (If response is "NO", the following Medicare IM given date fields will be blank) Date Medicare IM given:   Date Additional Medicare IM given:    Discharge Disposition:    Per UR Regulation:    If discussed at Long Length of Stay Meetings, dates discussed:    Comments:
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
## 2013-05-04 NOTE — Progress Notes (Signed)
Physical Therapy Treatment Patient Details Name: [name] MRN: [id number] DOB: 1946/05/21 Today's Date: 05/04/2013 Time: 4098-1191 PT Time Calculation (min): 43 min  PT Assessment / Plan / Recommendation  History of Present Illness LTKA 05/03/13   PT Comments   Pt tolerated ambulation and there ex well. Pt plans DC tomorrow. Ambulated without KI  Follow Up Recommendations  Home health PT     Does the patient have the potential to tolerate intense rehabilitation     Barriers to Discharge        Equipment Recommendations  None recommended by PT    Recommendations for Other Services    Frequency 7X/week   Progress towards PT Goals Progress towards PT goals: Progressing toward goals  Plan Current plan remains appropriate    Precautions / Restrictions Precautions Precautions: Knee Required Braces or Orthoses: Knee Immobilizer - Left Knee Immobilizer - Left: Discontinue once straight leg raise with < 10 degree lag   Pertinent Vitals/Pain Premedicated, 4 l knee, ice.    Mobility  Bed Mobility Supine to Sit: 4: Min assist Details for Bed Mobility Assistance: support of LLE Transfers Sit to Stand: 4: Min assist;From chair/3-in-1;From bed Stand to Sit: To chair/3-in-1 Details for Transfer Assistance: cues for hand and LLE placement. Cues for slowing down, pt gets toomuch in [place]. lost balance while standing andputting on her shirt. Ambulation/Gait Ambulation/Gait Assistance: 4: Min guard Ambulation Distance (Feet): 120 Feet Assistive device: Rolling walker Ambulation/Gait Assistance Details: cues for step length and for safety, slow pace. Gait Pattern: Step-through pattern;Decreased stride length    Exercises Total Joint Exercises Quad Sets: AROM;Left;10 reps;Supine Short Arc Quad: AROM;Left;10 reps;Supine Heel Slides: AAROM;Left;10 reps;Supine Hip ABduction/ADduction: AAROM;Left;10 reps;Supine Straight Leg Raises: AAROM;Left;10 reps;Supine Goniometric ROM:  10-45 L knee   PT Diagnosis:    PT Problem List:   PT Treatment Interventions:     PT Goals (current goals can now be found in the care plan section)    Visit Information  Last PT Received On: 05/04/13 Assistance Needed: +1 History of Present Illness: LTKA 05/03/13    Subjective Data      Cognition  Cognition Arousal/Alertness: Awake/alert    Balance     End of Session PT - End of Session Activity Tolerance: Patient tolerated treatment well Patient left: in chair;with call bell/phone within reach;with family/visitor present Nurse Communication: Mobility status   GP     Rada Hay 05/04/2013, 3:51 PM

## 2013-05-05 LAB — CBC
MCH: 28.2 pg (ref 26.0–34.0)
MCHC: 33.2 g/dL (ref 30.0–36.0)
RDW: 14.3 % (ref 11.5–15.5)

## 2013-05-05 LAB — BASIC METABOLIC PANEL
CO2: 29 mEq/L (ref 19–32)
Chloride: 97 mEq/L (ref 96–112)
Creatinine, Ser: 0.55 mg/dL (ref 0.50–1.10)
Glucose, Bld: 149 mg/dL — ABNORMAL HIGH (ref 70–99)
Potassium: 4.3 mEq/L (ref 3.5–5.1)
Sodium: 134 mEq/L — ABNORMAL LOW (ref 135–145)

## 2013-05-05 MED ORDER — POLYSACCHARIDE IRON COMPLEX 150 MG PO CAPS: 150.0000 mg | ORAL_CAPSULE | Freq: Every day | ORAL | Status: DC

## 2013-05-05 MED ORDER — TRAMADOL HCL 50 MG PO TABS: 50.0000 mg | ORAL_TABLET | Freq: Four times a day (QID) | ORAL | Status: DC | PRN

## 2013-05-05 MED ORDER — METHOCARBAMOL 500 MG PO TABS: 500.0000 mg | ORAL_TABLET | Freq: Four times a day (QID) | ORAL | Status: DC | PRN

## 2013-05-05 MED ORDER — HYDROMORPHONE HCL 2 MG PO TABS: 2.0000 mg | ORAL_TABLET | ORAL | Status: DC | PRN
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
## 2013-05-05 NOTE — Progress Notes (Signed)
Physical Therapy Treatment Patient Details Name: [name] MRN: [id number] DOB: 1946-07-20 Today's Date: 05/05/2013 Time: 4098-1191 PT Time Calculation (min): 38 min  PT Assessment / Plan / Recommendation  History of Present Illness     PT Comments   Ready for DC  Follow Up Recommendations  Home health PT     Does the patient have the potential to tolerate intense rehabilitation     Barriers to Discharge        Equipment Recommendations  None recommended by PT    Recommendations for Other Services    Frequency 7X/week   Progress towards PT Goals Progress towards PT goals: Progressing toward goals  Plan Current plan remains appropriate    Precautions / Restrictions     Pertinent Vitals/Pain     Mobility  Bed Mobility Supine to Sit: 6: Modified independent (Device/Increase time) Sit to Supine: 6: Modified independent (Device/Increase time) Transfers Sit to Stand: 5: Supervision;From bed;From chair/3-in-1 Stand to Sit: 5: Supervision;To chair/3-in-1;To bed Ambulation/Gait Ambulation/Gait Assistance: 5: Supervision Ambulation Distance (Feet): 400 Feet Assistive device: Rolling walker Gait Pattern: Step-through pattern    Exercises Total Joint Exercises Quad Sets: AROM;Left;10 reps;Supine Short Arc Quad: AROM;Left;10 reps;Supine Heel Slides: AAROM;Left;10 reps;Supine Hip ABduction/ADduction: AAROM;Left;10 reps;Supine Straight Leg Raises: AAROM;Left;10 reps;Supine Goniometric ROM: 10-60   PT Diagnosis:    PT Problem List:   PT Treatment Interventions:     PT Goals (current goals can now be found in the care plan section)    Visit Information  Last PT Received On: 05/05/13 Assistance Needed: +1    Subjective Data      Cognition       Balance     End of Session PT - End of Session Activity Tolerance: Patient tolerated treatment well Patient left: with call bell/phone within reach;with family/visitor present;in bed Nurse Communication:  Mobility status   GP     Rada Hay 05/05/2013, 2:09 PM

## 2013-05-12 ENCOUNTER — Ambulatory Visit: Payer: Medicare Other | Admitting: Cardiology

## 2013-05-31 ENCOUNTER — Telehealth: Payer: Self-pay | Admitting: Cardiology

## 2013-05-31 NOTE — Telephone Encounter (Signed)
She has an appt with Dawayne Patricia 06/15/13.

## 2013-05-31 NOTE — Telephone Encounter (Signed)
Spoke with patient. Pt states she had 2 knee surgeries in the last 4 months. She was told she was anemic and  prescribed iron supplement on discharge from the last surgery a few weeks ago. Pt states she does not have any signs of obvious bleeding and feels she continues to improve after recent surgeries.

## 2013-05-31 NOTE — Telephone Encounter (Signed)
Busy

## 2013-05-31 NOTE — Telephone Encounter (Signed)
New Problem  Pt states she was recently advised that she was anemic was given iron pills/ wants to know why she was anemic and she has never had this problem// wants to discuss medication changes.

## 2013-06-03 ENCOUNTER — Other Ambulatory Visit (HOSPITAL_COMMUNITY)
Admission: RE | Admit: 2013-06-03 | Discharge: 2013-06-03 | Disposition: A | Discharge status: Home / Self Care | Payer: Medicare Other | Source: Ambulatory Visit | Attending: Gynecologic Oncology | Admitting: Gynecologic Oncology

## 2013-06-03 ENCOUNTER — Encounter: Payer: Self-pay | Admitting: Gynecologic Oncology

## 2013-06-03 ENCOUNTER — Other Ambulatory Visit: Payer: Self-pay | Admitting: Cardiology

## 2013-06-03 ENCOUNTER — Ambulatory Visit: Payer: Medicare Other | Attending: Gynecologic Oncology | Admitting: Gynecologic Oncology

## 2013-06-03 VITALS — BP 120/72 | HR 68 | Temp 98.8°F | Resp 16 | Ht 64.0 in | Wt 214.1 lb

## 2013-06-03 DIAGNOSIS — Z90710 Acquired absence of both cervix and uterus: Secondary | ICD-10-CM | POA: Insufficient documentation

## 2013-06-03 DIAGNOSIS — C549 Malignant neoplasm of corpus uteri, unspecified: Secondary | ICD-10-CM | POA: Insufficient documentation

## 2013-06-03 DIAGNOSIS — E669 Obesity, unspecified: Secondary | ICD-10-CM | POA: Insufficient documentation

## 2013-06-03 DIAGNOSIS — Z7901 Long term (current) use of anticoagulants: Secondary | ICD-10-CM | POA: Insufficient documentation

## 2013-06-03 DIAGNOSIS — C541 Malignant neoplasm of endometrium: Secondary | ICD-10-CM | POA: Insufficient documentation

## 2013-06-03 DIAGNOSIS — Z79899 Other long term (current) drug therapy: Secondary | ICD-10-CM | POA: Insufficient documentation

## 2013-06-03 DIAGNOSIS — I4891 Unspecified atrial fibrillation: Secondary | ICD-10-CM | POA: Insufficient documentation

## 2013-06-03 DIAGNOSIS — Z96659 Presence of unspecified artificial knee joint: Secondary | ICD-10-CM | POA: Insufficient documentation

## 2013-06-03 DIAGNOSIS — I10 Essential (primary) hypertension: Secondary | ICD-10-CM | POA: Insufficient documentation

## 2013-06-03 DIAGNOSIS — Z124 Encounter for screening for malignant neoplasm of cervix: Secondary | ICD-10-CM | POA: Insufficient documentation

## 2013-06-03 DIAGNOSIS — Z9079 Acquired absence of other genital organ(s): Secondary | ICD-10-CM | POA: Insufficient documentation

## 2013-06-03 NOTE — Patient Instructions (Signed)
Return to clinic in 6 months.
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
## 2013-06-09 ENCOUNTER — Encounter: Payer: Self-pay | Admitting: Gynecologic Oncology

## 2013-06-09 ENCOUNTER — Telehealth: Payer: Self-pay | Admitting: Gynecologic Oncology

## 2013-06-09 NOTE — Telephone Encounter (Signed)
Pt notified about pap results: negative.  No questions or concerns voiced. 

## 2013-06-15 ENCOUNTER — Encounter: Payer: Self-pay | Admitting: Nurse Practitioner

## 2013-06-15 ENCOUNTER — Ambulatory Visit (INDEPENDENT_AMBULATORY_CARE_PROVIDER_SITE_OTHER): Payer: Medicare Other | Admitting: Nurse Practitioner

## 2013-06-15 VITALS — BP 100/60 | HR 56 | Ht 63.0 in | Wt 214.0 lb

## 2013-06-15 DIAGNOSIS — I4891 Unspecified atrial fibrillation: Secondary | ICD-10-CM

## 2013-06-15 LAB — CBC WITH DIFFERENTIAL/PLATELET
Basophils Absolute: 0 10*3/uL (ref 0.0–0.1)
Basophils Relative: 0.6 % (ref 0.0–3.0)
Eosinophils Absolute: 0.2 10*3/uL (ref 0.0–0.7)
Eosinophils Relative: 3.4 % (ref 0.0–5.0)
HCT: 33.8 % — ABNORMAL LOW (ref 36.0–46.0)
Hemoglobin: 11.3 g/dL — ABNORMAL LOW (ref 12.0–15.0)
Lymphocytes Relative: 33.9 % (ref 12.0–46.0)
Lymphs Abs: 2 10*3/uL (ref 0.7–4.0)
MCHC: 33.4 g/dL (ref 30.0–36.0)
MCV: 84.8 fl (ref 78.0–100.0)
Monocytes Absolute: 0.5 10*3/uL (ref 0.1–1.0)
Monocytes Relative: 9 % (ref 3.0–12.0)
Neutro Abs: 3.1 10*3/uL (ref 1.4–7.7)
Neutrophils Relative %: 53.1 % (ref 43.0–77.0)
Platelets: 301 10*3/uL (ref 150.0–400.0)
RBC: 3.99 Mil/uL (ref 3.87–5.11)
RDW: 15.3 % — ABNORMAL HIGH (ref 11.5–14.6)
WBC: 5.8 10*3/uL (ref 4.5–10.5)

## 2013-06-15 LAB — BASIC METABOLIC PANEL
BUN: 13 mg/dL (ref 6–23)
CO2: 28 mEq/L (ref 19–32)
Calcium: 9.4 mg/dL (ref 8.4–10.5)
Chloride: 100 mEq/L (ref 96–112)
Creatinine, Ser: 0.7 mg/dL (ref 0.4–1.2)
GFR: 88.93 mL/min (ref >60.00)
Glucose, Bld: 101 mg/dL — ABNORMAL HIGH (ref 70–99)
Potassium: 3.9 mEq/L (ref 3.5–5.1)
Sodium: 137 mEq/L (ref 135–145)

## 2013-06-15 NOTE — Patient Instructions (Addendum)
Stay on your current medicines  We will check labs today to relook at your blood count  Keep working on your weight and congrats for what you have done so far  See Dr. Shirlee Latch in 6 month - call for an appointment in January for March  Call the [facility] office at [phone] if you have any questions, problems or concerns.
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
## 2013-09-23 DIAGNOSIS — C259 Malignant neoplasm of pancreas, unspecified: Secondary | ICD-10-CM

## 2013-09-23 HISTORY — DX: Malignant neoplasm of pancreas, unspecified: C25.9

## 2013-10-05 ENCOUNTER — Other Ambulatory Visit: Payer: Self-pay | Admitting: Cardiology

## 2013-10-28 ENCOUNTER — Other Ambulatory Visit: Payer: Self-pay | Admitting: Cardiology

## 2013-11-04 ENCOUNTER — Other Ambulatory Visit: Payer: Self-pay | Admitting: Cardiology

## 2013-11-25 ENCOUNTER — Encounter: Payer: Self-pay | Admitting: Gynecologic Oncology

## 2013-11-25 ENCOUNTER — Ambulatory Visit: Payer: Medicare Other | Attending: Gynecologic Oncology | Admitting: Gynecologic Oncology

## 2013-11-25 VITALS — BP 146/81 | HR 58 | Temp 98.1°F | Resp 16 | Ht 64.0 in | Wt 215.0 lb

## 2013-11-25 DIAGNOSIS — I10 Essential (primary) hypertension: Secondary | ICD-10-CM | POA: Insufficient documentation

## 2013-11-25 DIAGNOSIS — I4891 Unspecified atrial fibrillation: Secondary | ICD-10-CM | POA: Insufficient documentation

## 2013-11-25 DIAGNOSIS — Z79899 Other long term (current) drug therapy: Secondary | ICD-10-CM | POA: Insufficient documentation

## 2013-11-25 DIAGNOSIS — K219 Gastro-esophageal reflux disease without esophagitis: Secondary | ICD-10-CM | POA: Insufficient documentation

## 2013-11-25 DIAGNOSIS — C541 Malignant neoplasm of endometrium: Secondary | ICD-10-CM

## 2013-11-25 DIAGNOSIS — E669 Obesity, unspecified: Secondary | ICD-10-CM | POA: Insufficient documentation

## 2013-11-25 DIAGNOSIS — Z90710 Acquired absence of both cervix and uterus: Secondary | ICD-10-CM | POA: Insufficient documentation

## 2013-11-25 DIAGNOSIS — C549 Malignant neoplasm of corpus uteri, unspecified: Secondary | ICD-10-CM | POA: Insufficient documentation

## 2013-11-25 DIAGNOSIS — Z885 Allergy status to narcotic agent status: Secondary | ICD-10-CM | POA: Insufficient documentation

## 2013-11-25 DIAGNOSIS — Z7901 Long term (current) use of anticoagulants: Secondary | ICD-10-CM | POA: Insufficient documentation

## 2013-11-25 DIAGNOSIS — Z881 Allergy status to other antibiotic agents status: Secondary | ICD-10-CM | POA: Insufficient documentation
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
## 2013-11-25 NOTE — Patient Instructions (Signed)
Return to clinic in 6 months.

## 2013-12-16 ENCOUNTER — Ambulatory Visit: Payer: Medicare Other | Admitting: Cardiology

## 2013-12-29 ENCOUNTER — Other Ambulatory Visit: Payer: Self-pay | Admitting: Cardiology

## 2013-12-29 ENCOUNTER — Other Ambulatory Visit: Payer: Self-pay | Admitting: Internal Medicine

## 2014-01-08 ENCOUNTER — Other Ambulatory Visit: Payer: Self-pay | Admitting: Cardiology

## 2014-01-27 ENCOUNTER — Encounter: Payer: Self-pay | Admitting: *Deleted

## 2014-01-27 ENCOUNTER — Telehealth: Payer: Self-pay | Admitting: Internal Medicine

## 2014-01-27 NOTE — Telephone Encounter (Signed)
Patient reports for 5 weeks, she has had a pain around her belly button. The area is sore, tender. Reports it is worse now. Not sure if it is diverticulitis or something else. She has tried Tylenol and it helps some. She is having some nausea today. Scheduled with Dr. Olevia Perches tomorrow at 2:30 PM.

## 2014-01-28 ENCOUNTER — Other Ambulatory Visit (INDEPENDENT_AMBULATORY_CARE_PROVIDER_SITE_OTHER): Payer: Medicare Other

## 2014-01-28 ENCOUNTER — Ambulatory Visit (INDEPENDENT_AMBULATORY_CARE_PROVIDER_SITE_OTHER): Payer: Medicare Other | Admitting: Internal Medicine

## 2014-01-28 ENCOUNTER — Encounter: Payer: Self-pay | Admitting: Internal Medicine

## 2014-01-28 VITALS — BP 110/72 | HR 66 | Ht 64.0 in | Wt 204.8 lb

## 2014-01-28 DIAGNOSIS — R1011 Right upper quadrant pain: Secondary | ICD-10-CM

## 2014-01-28 LAB — CBC WITH DIFFERENTIAL/PLATELET
Basophils Absolute: 0 10*3/uL (ref 0.0–0.1)
Basophils Relative: 0.4 % (ref 0.0–3.0)
EOS ABS: 0.2 10*3/uL (ref 0.0–0.7)
Eosinophils Relative: 2.6 % (ref 0.0–5.0)
HCT: 36.6 % (ref 36.0–46.0)
Hemoglobin: 12.3 g/dL (ref 12.0–15.0)
Lymphocytes Relative: 31.8 % (ref 12.0–46.0)
Lymphs Abs: 2 10*3/uL (ref 0.7–4.0)
MCHC: 33.6 g/dL (ref 30.0–36.0)
MCV: 87.9 fl (ref 78.0–100.0)
MONO ABS: 0.6 10*3/uL (ref 0.1–1.0)
Monocytes Relative: 9.7 % (ref 3.0–12.0)
NEUTROS PCT: 55.5 % (ref 43.0–77.0)
Neutro Abs: 3.5 10*3/uL (ref 1.4–7.7)
PLATELETS: 240 10*3/uL (ref 150.0–400.0)
RBC: 4.16 Mil/uL (ref 3.87–5.11)
RDW: 14.2 % (ref 11.5–15.5)
WBC: 6.4 10*3/uL (ref 4.0–10.5)

## 2014-01-28 LAB — HEPATIC FUNCTION PANEL
ALBUMIN: 4.3 g/dL (ref 3.5–5.2)
ALT: 18 U/L (ref 0–35)
AST: 21 U/L (ref 0–37)
Alkaline Phosphatase: 70 U/L (ref 39–117)
Bilirubin, Direct: 0.1 mg/dL (ref 0.0–0.3)
Total Bilirubin: 0.8 mg/dL (ref 0.2–1.2)
Total Protein: 7.2 g/dL (ref 6.0–8.3)

## 2014-01-28 LAB — LIPASE: LIPASE: 20 U/L (ref 11.0–59.0)

## 2014-01-28 LAB — AMYLASE: Amylase: 51 U/L (ref 27–131)

## 2014-01-28 MED ORDER — DICYCLOMINE HCL 10 MG PO CAPS: 10.0000 mg | ORAL_CAPSULE | Freq: Two times a day (BID) | ORAL | Status: DC

## 2014-01-28 MED ORDER — CIPROFLOXACIN HCL 250 MG PO TABS: 250.0000 mg | ORAL_TABLET | Freq: Two times a day (BID) | ORAL | Status: DC

## 2014-01-28 NOTE — Patient Instructions (Addendum)
Your physician has requested that you go to the basement for the following lab work before leaving today: CBC, Amylase, Lipase, LFT's, Lipase  We have sent the following medications to your pharmacy for you to pick up at your convenience: Cipro 250 mg twice daily x 1 week Bentyl 10 mg twice daily x 1 week  You have been scheduled for an abdominal ultrasound at [facility] (1st floor of hospital) on Tuesday, 02/01/14 at 2:30 pm. Please arrive 15 minutes prior to your appointment for registration. Make certain not to have anything to eat or drink 6 hours prior to your appointment. Should you need to reschedule your appointment, please contact radiology at [phone]. This test typically takes about 30 minutes to perform.  Please remain on a full liquid diet for the next 24 hours.  Happy Mothers Day!!!!  Full Liquid Diet The full liquid diet includes fluids and foods that are liquid, or will become liquid, at room temperature. Ice cream, gelatin dessert, tea, juice, frozen ice pops, and pudding are some examples of foods that can be eaten on a full liquid diet. You cannot eat solid foods on this diet. A person should only be on this diet for a short amount of time. If this diet is to be used for more than 7 days, you may need to take a multivitamin or a nutritional supplement. REASONS FOR USE  It may be used as a transition diet between the clear liquid diet and soft diet.  It may be used when a person cannot tolerate solid foods.  It may be used before or after certain procedures, tests, or surgeries.  It may be used when a person has trouble swallowing or chewing. CHOOSING FOODS Breads and Starches  Allowed: None are allowed except crackers that are pureed (made into a thick, smooth soup) in soup. Potatoes, pasta, and rice are only allowed if they are pureed in soup. Cooked, refined corn, oat, rice, rye, and wheat cereals are also allowed.  Avoid: Any  others. Vegetables  Allowed: Strained tomato or vegetable juice. Vegetables pureed in soup.  Avoid: Any others. Fruit  Allowed: Any strained fruit juices and fruit drinks. Include 1 serving of citrus or vitamin C-enriched fruit juice daily.  Avoid: Any others. Protein  Allowed: Eggs in custard, eggnog mix, and eggs used in ice cream or pudding.  Avoid: Any meat, fish, or fowl. All other cooked or raw eggs. Dairy  Allowed: Milk and milk-based beverages, including milk shakes and instant breakfast mixes. Smooth yogurt. Pureed cottage cheese.  Avoid: Any others. All other cheese. Avoid dairy products if not tolerated. Soups and Combination Foods  Allowed: Broth and strained cream soups. Strained, broth-based soups.  Avoid: Any others. Desserts and Sweets  Allowed: Custard, flavored gelatin, tapioca, plain ice cream, sherbet, smooth pudding, junket, fruit ices, frozen ice pops, and pudding pops. Other frozen bars with cream, frozen fudge pops, and chocolate syrup. Sugar, honey, jelly, and syrup.  Avoid: Any others. Fats and Oils  Allowed: Margarine, butter, cream, sour cream, and oils.  Avoid: Any others. Beverages  Allowed: All.  Avoid: None. Condiments  Allowed: Iodized salt, pepper, spices, and flavorings. Cocoa powder.  Avoid: Any others. SAMPLE MEAL PLAN The following sample meal plan cannot meet the recommended dietary allowances of the [employer] without appropriate supplementation under the guidance of your caregiver or dietitian. Breakfast   cup orange juice.  1 cup cooked wheat cereal.  1 cup milk.  1 cup beverage (coffee  or tea).  Cream or sugar, if desired. Midmorning Snack  1 cup pasteurized eggnog (made from powdered eggs mixed with milk, not raw eggs). Lunch  1 cup cream soup.   cup fruit juice.  1 cup milk.   cup custard.  1 cup beverage (coffee or tea).  Cream or sugar, if desired. Midafternoon Snack  1 cup  milk shake. Dinner  1 cup cream soup.   cup fruit juice.  1 cup milk.   cup pudding.  1 cup beverage (coffee or tea).  Cream or sugar, if desired. Evening Snack  1 cup supplement. To increase calories, add sugar, cream, butter, or margarine if possible. Nutritional supplements will also increase the total calories. Document Released: 09/09/2005 Document Revised: 03/10/2012 Document Reviewed: 12/10/2011 [facility] Patient Information 2014 [place], [place].
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
## 2014-02-01 ENCOUNTER — Ambulatory Visit (INDEPENDENT_AMBULATORY_CARE_PROVIDER_SITE_OTHER): Payer: Medicare Other | Admitting: Cardiology

## 2014-02-01 ENCOUNTER — Ambulatory Visit (HOSPITAL_COMMUNITY)
Admission: RE | Admit: 2014-02-01 | Discharge: 2014-02-01 | Disposition: A | Discharge status: Home / Self Care | Payer: Medicare Other | Source: Ambulatory Visit | Attending: Internal Medicine | Admitting: Internal Medicine

## 2014-02-01 ENCOUNTER — Encounter: Payer: Self-pay | Admitting: Cardiology

## 2014-02-01 VITALS — BP 120/62 | HR 53 | Ht 64.0 in | Wt 205.0 lb

## 2014-02-01 DIAGNOSIS — I10 Essential (primary) hypertension: Secondary | ICD-10-CM

## 2014-02-01 DIAGNOSIS — I4891 Unspecified atrial fibrillation: Secondary | ICD-10-CM

## 2014-02-01 DIAGNOSIS — R1011 Right upper quadrant pain: Secondary | ICD-10-CM

## 2014-02-01 DIAGNOSIS — K8020 Calculus of gallbladder without cholecystitis without obstruction: Secondary | ICD-10-CM | POA: Insufficient documentation

## 2014-02-01 MED ORDER — LOSARTAN POTASSIUM 50 MG PO TABS: ORAL_TABLET | ORAL | Status: DC

## 2014-02-01 MED ORDER — METOPROLOL SUCCINATE ER 25 MG PO TB24: ORAL_TABLET | ORAL | Status: DC

## 2014-02-01 NOTE — Patient Instructions (Signed)
Your physician recommends that you have lab work today: BMP  Your physician wants you to follow-up in: 6 MONTHS with Dr Aundra Dubin.  You will receive a reminder letter in the mail two months in advance. If you don't receive a letter, please call our office to schedule the follow-up appointment.  Your physician recommends that you continue on your current medications as directed. Please refer to the Current Medication list given to you today.
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
## 2014-02-02 ENCOUNTER — Other Ambulatory Visit: Payer: Self-pay | Admitting: *Deleted

## 2014-02-02 DIAGNOSIS — R1011 Right upper quadrant pain: Secondary | ICD-10-CM

## 2014-02-02 LAB — BASIC METABOLIC PANEL
BUN: 19 mg/dL (ref 6–23)
CALCIUM: 9.6 mg/dL (ref 8.4–10.5)
CHLORIDE: 104 meq/L (ref 96–112)
CO2: 29 meq/L (ref 19–32)
Creatinine, Ser: 0.9 mg/dL (ref 0.4–1.2)
GFR: 70.94 mL/min (ref >60.00)
Glucose, Bld: 144 mg/dL — ABNORMAL HIGH (ref 70–99)
Potassium: 4.2 mEq/L (ref 3.5–5.1)
SODIUM: 139 meq/L (ref 135–145)

## 2014-02-17 ENCOUNTER — Ambulatory Visit (HOSPITAL_COMMUNITY)
Admission: RE | Admit: 2014-02-17 | Discharge: 2014-02-17 | Disposition: A | Discharge status: Home / Self Care | Payer: Medicare Other | Source: Ambulatory Visit | Attending: Internal Medicine | Admitting: Internal Medicine

## 2014-02-17 DIAGNOSIS — K8020 Calculus of gallbladder without cholecystitis without obstruction: Secondary | ICD-10-CM | POA: Insufficient documentation

## 2014-02-17 DIAGNOSIS — R1011 Right upper quadrant pain: Secondary | ICD-10-CM | POA: Insufficient documentation

## 2014-02-17 MED ORDER — TECHNETIUM TC 99M MEBROFENIN IV KIT
5.1000 | PACK | Freq: Once | INTRAVENOUS | Status: AC | PRN
  Administered 2014-02-17: 5 via INTRAVENOUS

## 2014-02-18 ENCOUNTER — Other Ambulatory Visit: Payer: Self-pay | Admitting: *Deleted

## 2014-02-18 ENCOUNTER — Encounter: Payer: Self-pay | Admitting: *Deleted

## 2014-02-18 ENCOUNTER — Telehealth: Payer: Self-pay | Admitting: *Deleted

## 2014-02-18 DIAGNOSIS — R109 Unspecified abdominal pain: Secondary | ICD-10-CM

## 2014-02-18 MED ORDER — DICYCLOMINE HCL 10 MG PO CAPS: ORAL_CAPSULE | ORAL | Status: DC

## 2014-02-18 MED ORDER — PSYLLIUM 28 % PO PACK: PACK | ORAL | Status: DC

## 2014-02-18 NOTE — Telephone Encounter (Signed)
  02/18/2014 RE: DALAYSIA HARMS DOB: Jan 05, 1946 MRN: [id number]  Dear Dr. [name],   We have scheduled the above patient for an endoscopic procedure. Our records show that she is on anticoagulation therapy.  Please advise as to whether the patient may hold Xarelto 2 days prior to the procedure which is scheduled for 03/08/14. Please route your response to Leone Payor, RN. Thank you for your help with this matter.   Sincerely,  Hulan Saas

## 2014-02-21 NOTE — Telephone Encounter (Signed)
That would be ok.

## 2014-02-21 NOTE — Telephone Encounter (Signed)
Note on pre visit, patient aware also.

## 2014-03-01 ENCOUNTER — Ambulatory Visit (AMBULATORY_SURGERY_CENTER): Payer: Medicare Other

## 2014-03-01 VITALS — Ht 63.0 in | Wt 204.2 lb

## 2014-03-01 DIAGNOSIS — R109 Unspecified abdominal pain: Secondary | ICD-10-CM

## 2014-03-01 NOTE — Progress Notes (Signed)
Per pt, no allergies to soy or egg products.Pt not taking any weight loss meds or using  O2 at home. 

## 2014-03-04 ENCOUNTER — Ambulatory Visit: Payer: Medicare Other | Admitting: Internal Medicine

## 2014-03-08 ENCOUNTER — Other Ambulatory Visit: Payer: Self-pay

## 2014-03-08 ENCOUNTER — Other Ambulatory Visit: Payer: Self-pay | Admitting: *Deleted

## 2014-03-08 ENCOUNTER — Encounter: Payer: Self-pay | Admitting: Internal Medicine

## 2014-03-08 ENCOUNTER — Ambulatory Visit (AMBULATORY_SURGERY_CENTER): Payer: Medicare Other | Admitting: Internal Medicine

## 2014-03-08 ENCOUNTER — Telehealth: Payer: Self-pay | Admitting: *Deleted

## 2014-03-08 VITALS — BP 124/72 | HR 48 | Temp 97.0°F | Resp 23

## 2014-03-08 DIAGNOSIS — R109 Unspecified abdominal pain: Secondary | ICD-10-CM

## 2014-03-08 DIAGNOSIS — K8020 Calculus of gallbladder without cholecystitis without obstruction: Secondary | ICD-10-CM

## 2014-03-08 DIAGNOSIS — Z1231 Encounter for screening mammogram for malignant neoplasm of breast: Secondary | ICD-10-CM

## 2014-03-08 DIAGNOSIS — K2940 Chronic atrophic gastritis without bleeding: Secondary | ICD-10-CM

## 2014-03-08 MED ORDER — SODIUM CHLORIDE 0.9 % IV SOLN: 500.0000 mL | INTRAVENOUS | Status: DC

## 2014-03-08 MED ORDER — OMEPRAZOLE 20 MG PO CPDR: DELAYED_RELEASE_CAPSULE | ORAL | Status: DC

## 2014-03-08 NOTE — Progress Notes (Signed)
A/ox3 pleased with MAC, report t0 Erasmo Downer RN

## 2014-03-08 NOTE — Op Note (Signed)
[facility]  [address] [place], [id number]   ENDOSCOPY PROCEDURE REPORT  PATIENT: [name], [name]  MR#: [id number] BIRTHDATE: 1945/12/30 , 67  yrs. old GENDER: Female ENDOSCOPIST: Lafayette Dragon, MD REFERRED BY:  Florina Ou, M.D. PROCEDURE DATE:  03/08/2014 PROCEDURE:  EGD w/ biopsy ASA CLASS:     Class II INDICATIONS:  cholelithiasis.  Normal HIDA scan with ejection fraction of 86%.  Pain localized to right upper quadrant and periumbilical area, normal liver function tests. MEDICATIONS: MAC sedation, administered by CRNA and Propofol (Diprivan) 140 mg IV TOPICAL ANESTHETIC: none  DESCRIPTION OF PROCEDURE: After the risks benefits and alternatives of the procedure were thoroughly explained, informed consent was obtained.  The LB [id number] endoscope was introduced through the mouth and advanced to the second portion of the duodenum. Without limitations.  The instrument was slowly withdrawn as the mucosa was fully examined.      Esophagus: Proximal mid and distal esophageal mucosa appeared normal. Z line was normal. There was no stricture or esophagitis. There was a 1 cm reducible hiatal hernia Stomach: Gastric[   folds there are unremarkable. There was minimal erythema in the gastric antrum. Biopsies were obtained to rule out H. pylori. Pyloric outlet was normal. Retroflexion of the scope revealed normal fundus and cardia Duodenum,: duodenal bulb and descending duodenum was normal      The scope was then withdrawn from the patient and the procedure completed.  COMPLICATIONS: There were no complications. ENDOSCOPIC IMPRESSION:  essentially normal upper endoscopy of esophagus stomach and duodenum. Nothing to account for abdominal pain Minimal antral gastritis. Status post biopsies RECOMMENDATIONS: 1.  Await pathology results 2.  Continue PPI 3.  surgical referral Trial of antispasmodics  REPEAT EXAM: for EGD pending biopsy  results.  eSigned:  Lafayette Dragon, MD 03/08/2014 8:57 AM   CC:

## 2014-03-08 NOTE — Patient Instructions (Addendum)
YOU HAD AN ENDOSCOPIC PROCEDURE TODAY AT [facility]: Refer to the procedure report that was given to you for any specific questions about what was found during the examination.  If the procedure report does not answer your questions, please call your gastroenterologist to clarify.  If you requested that your care partner not be given the details of your procedure findings, then the procedure report has been included in a sealed envelope for you to review at your convenience later.  YOU SHOULD EXPECT: Some feelings of bloating in the abdomen. Passage of more gas than usual.  Walking can help get rid of the air that was put into your GI tract during the procedure and reduce the bloating.  DIET: Your first meal following the procedure should be a light meal and then it is ok to progress to your normal diet.  A half-sandwich or bowl of soup is an example of a good first meal.  Heavy or fried foods are harder to digest and may make you feel nauseous or bloated.  Likewise meals heavy in dairy and vegetables can cause extra gas to form and this can also increase the bloating.  Drink plenty of fluids but you should avoid alcoholic beverages for 24 hours.  ACTIVITY: Your care partner should take you home directly after the procedure.  You should plan to take it easy, moving slowly for the rest of the day.  You can resume normal activity the day after the procedure however you should NOT DRIVE or use heavy machinery for 24 hours (because of the sedation medicines used during the test).    SYMPTOMS TO REPORT IMMEDIATELY: A gastroenterologist can be reached at any hour.  During normal business hours, 8:30 AM to 5:00 PM Monday through Friday, call [phone].  After hours and on weekends, please call the GI answering service at [phone] who will take a message and have the physician on call contact you.   Following upper endoscopy (EGD)  Vomiting of blood or coffee ground material  New  chest pain or pain under the shoulder blades  Painful or persistently difficult swallowing  New shortness of breath  Fever of 100F or higher  Black, tarry-looking stools  FOLLOW UP: If any biopsies were taken you will be contacted by phone or by letter within the next 1-3 weeks.  Call your gastroenterologist if you have not heard about the biopsies in 3 weeks.  Our staff will call the home number listed on your records the next business day following your procedure to check on you and address any questions or concerns that you may have at that time regarding the information given to you following your procedure. This is a courtesy call and so if there is no answer at the home number and we have not heard from you through the emergency physician on call, we will assume that you have returned to your regular daily activities without incident.  SIGNATURES/CONFIDENTIALITY: You and/or your care partner have signed paperwork which will be entered into your electronic medical record.  These signatures attest to the fact that that the information above on your After Visit Summary has been reviewed and is understood.  Full responsibility of the confidentiality of this discharge information lies with you and/or your care-partner.  Await pathology  Dr. Nichola Sizer office nurse will call you with a surgical referral  Restart Xarelto today  Your prescription was sent [facility] your [facility]  Continue your other medications, including Bentyl

## 2014-03-08 NOTE — Telephone Encounter (Signed)
Message copied by Hulan Saas on Tue Mar 08, 2014  2:42 PM ------      Message from: [facility] Signs      Created: Tue Mar 08, 2014 11:29 AM       Regina,Pt is scheduled for July 2nd arrive at 10.00am for a 10.15am apt with Dr Remo Lipps gross.Peggyann Juba      ----- Message -----         From: Hulan Saas, RN         Sent: 03/08/2014  10:46 AM           To: Lindi Adie,      I made a referral to [facility] for this patient. I think she is new and she does not have a preference for MD. She is coming for cholelithiasis, abdominal pain.       Just let me know when she is scheduled.       Thanks,      Leone Payor, RN       ------

## 2014-03-08 NOTE — Progress Notes (Signed)
Per Dr. Olevia Perches, pt to restart Xarelto today  Per Dr. Olevia Perches, pt needs Omeprazole 20mg  1 po daily, #30, 1 refill

## 2014-03-08 NOTE — Telephone Encounter (Signed)
Spoke with patient and gave her OV with Dr. Johney [place].

## 2014-03-08 NOTE — Progress Notes (Signed)
Called to room to assist during endoscopic procedure.  Patient ID and intended procedure confirmed with present staff. Received instructions for my participation in the procedure from the performing physician.  

## 2014-03-09 ENCOUNTER — Ambulatory Visit (INDEPENDENT_AMBULATORY_CARE_PROVIDER_SITE_OTHER): Payer: Medicare Other | Admitting: Surgery

## 2014-03-09 ENCOUNTER — Telehealth: Payer: Self-pay | Admitting: *Deleted

## 2014-03-09 NOTE — Telephone Encounter (Signed)
  Follow up Call-  Call back number 03/08/2014  Post procedure Call Back phone  # [phone]  Permission to leave phone message Yes     Patient questions:  Do you have a fever, pain , or abdominal swelling? no Pain Score  0 *  Have you tolerated food without any problems? yes  Have you been able to return to your normal activities? yes  Do you have any questions about your discharge instructions: Diet   no Medications  no Follow up visit  no  Do you have questions or concerns about your Care? no  Actions: * If pain score is 4 or above: No action needed, pain <4.

## 2014-03-16 ENCOUNTER — Encounter: Payer: Self-pay | Admitting: Internal Medicine

## 2014-03-24 ENCOUNTER — Encounter (INDEPENDENT_AMBULATORY_CARE_PROVIDER_SITE_OTHER): Payer: Self-pay | Admitting: Surgery

## 2014-03-24 ENCOUNTER — Ambulatory Visit (INDEPENDENT_AMBULATORY_CARE_PROVIDER_SITE_OTHER): Payer: Medicare Other | Admitting: Surgery

## 2014-03-24 VITALS — BP 110/72 | HR 68 | Resp 18 | Ht 63.0 in | Wt 201.0 lb

## 2014-03-24 DIAGNOSIS — K2950 Unspecified chronic gastritis without bleeding: Secondary | ICD-10-CM | POA: Insufficient documentation

## 2014-03-24 DIAGNOSIS — K8020 Calculus of gallbladder without cholecystitis without obstruction: Secondary | ICD-10-CM | POA: Insufficient documentation

## 2014-03-24 DIAGNOSIS — M546 Pain in thoracic spine: Secondary | ICD-10-CM

## 2014-03-24 DIAGNOSIS — C541 Malignant neoplasm of endometrium: Secondary | ICD-10-CM

## 2014-03-24 DIAGNOSIS — G8929 Other chronic pain: Secondary | ICD-10-CM

## 2014-03-24 DIAGNOSIS — D126 Benign neoplasm of colon, unspecified: Secondary | ICD-10-CM | POA: Insufficient documentation

## 2014-03-24 DIAGNOSIS — C549 Malignant neoplasm of corpus uteri, unspecified: Secondary | ICD-10-CM

## 2014-03-24 DIAGNOSIS — K2940 Chronic atrophic gastritis without bleeding: Secondary | ICD-10-CM

## 2014-03-24 DIAGNOSIS — R1033 Periumbilical pain: Secondary | ICD-10-CM | POA: Insufficient documentation

## 2014-03-24 DIAGNOSIS — M549 Dorsalgia, unspecified: Secondary | ICD-10-CM | POA: Insufficient documentation

## 2014-03-24 DIAGNOSIS — E669 Obesity, unspecified: Secondary | ICD-10-CM

## 2014-03-24 DIAGNOSIS — R1011 Right upper quadrant pain: Secondary | ICD-10-CM

## 2014-03-24 NOTE — Addendum Note (Signed)
Addended by: Adin Hector on: 03/24/2014 02:15 PM   Modules accepted: Level of Service

## 2014-03-24 NOTE — Patient Instructions (Signed)
Please consider the recommendations that we have given you today:  While you have gallstones, I am skeptical that this is the source of your pain.  Obtain CAT scan of the abdomen and pelvis to evaluate her abdominal and back pain.  Call us for results  Treat abdominal back pain because of a musculoskeletal strain.  Ice/heat.  Anti-inflammatories.  Do that around the clock.  See if that helps. (see below)  Continue your moderate bicycling exercises as tolerated.  Continue Metamucil fiber supplement.  To that twice a day to see if that helps your bowel function. (see below)  See the Handout(s) we have given you.  Please call our office at [phone] if you wish to schedule surgery or if you have further questions / concerns.   Managing Pain  Pain after surgery or related to activity is often due to strain/injury to muscle, tendon, nerves and/or incisions.  This pain is usually short-term and will improve in a few months.   Many people find it helpful to do the following things TOGETHER to help speed the process of healing and to get back to regular activity more quickly:  1. Avoid heavy physical activity a.  no lifting greater than 20 pounds b. Do not "push through" the pain.  Listen to your body and avoid positions and maneuvers than reproduce the pain c. Walking is okay as tolerated, but go slowly and stop when getting sore.  d. Remember: If it hurts to do it, then don't do it! 2. Take Anti-inflammatory medication  a. Take with food/snack around the clock for 1-2 weeks i. This helps the muscle and nerve tissues become less irritable and calm down faster b. Choose ONE of the following over-the-counter medications: i. Naproxen 220mg  tabs (ex. Aleve) 1-2 pills twice a day  ii. Ibuprofen 200mg  tabs (ex. Advil, Motrin) 3-4 pills with every meal and just before bedtime iii. Acetaminophen 500mg  tabs (Tylenol) 1-2 pills with every meal and just before bedtime 3. Use a Heating pad or  Ice/Cold Pack a. 4-6 times a day b. May use warm bath/hottub  or showers 4. Try Gentle Massage and/or Stretching  a. at the area of pain many times a day b. stop if you feel pain - do not overdo it  Try these steps together to help you body heal faster and avoid making things get worse.  Doing just one of these things may not be enough.    If you are not getting better after two weeks or are noticing you are getting worse, contact our office for further advice; we may need to re-evaluate you & see what other things we can do to help.  GETTING TO GOOD BOWEL HEALTH. Irregular bowel habits such as constipation and diarrhea can lead to many problems over time.  Having one soft bowel movement a day is the most important way to prevent further problems.  The anorectal canal is designed to handle stretching and feces to safely manage our ability to get rid of solid waste (feces, poop, stool) out of our body.  BUT, hard constipated stools can act like ripping concrete bricks and diarrhea can be a burning fire to this very sensitive area of our body, causing inflamed hemorrhoids, anal fissures, increasing risk is perirectal abscesses, abdominal pain/bloating, an making irritable bowel worse.     The goal: ONE SOFT BOWEL MOVEMENT A DAY!  To have soft, regular bowel movements:    Drink at least 8 tall glasses of water a  day.     Take plenty of fiber.  Fiber is the undigested part of plant food that passes into the colon, acting s "natures broom" to encourage bowel motility and movement.  Fiber can absorb and hold large amounts of water. This results in a larger, bulkier stool, which is soft and easier to pass. Work gradually over several weeks up to 6 servings a day of fiber (25g a day even more if needed) in the form of: o Vegetables -- Root (potatoes, carrots, turnips), leafy green (lettuce, salad greens, celery, spinach), or cooked high residue (cabbage, broccoli, etc) o Fruit -- Fresh (unpeeled skin &  pulp), Dried (prunes, apricots, cherries, etc ),  or stewed ( applesauce)  o Whole grain breads, pasta, etc (whole wheat)  o Bran cereals    Bulking Agents -- This type of water-retaining fiber generally is easily obtained each day by one of the following:  o Psyllium bran -- The psyllium plant is remarkable because its ground seeds can retain so much water. This product is available as Metamucil, Konsyl, Effersyllium, Per Diem Fiber, or the less expensive generic preparation in drug and health food stores. Although labeled a laxative, it really is not a laxative.  o Methylcellulose -- This is another fiber derived from wood which also retains water. It is available as Citrucel. o Polyethylene Glycol - and "artificial" fiber commonly called Miralax or Glycolax.  It is helpful for people with gassy or bloated feelings with regular fiber o Flax Seed - a less gassy fiber than psyllium   No reading or other relaxing activity while on the toilet. If bowel movements take longer than 5 minutes, you are too constipated   AVOID CONSTIPATION.  High fiber and water intake usually takes care of this.  Sometimes a laxative is needed to stimulate more frequent bowel movements, but    Laxatives are not a good long-term solution as it can wear the colon out. o Osmotics (Milk of Magnesia, Fleets phosphosoda, Magnesium citrate, MiraLax, GoLytely) are safer than  o Stimulants (Senokot, Castor Oil, Dulcolax, Ex Lax)    o Do not take laxatives for more than 7days in a row.    IF SEVERELY CONSTIPATED, try a Bowel Retraining Program: o Do not use laxatives.  o Eat a diet high in roughage, such as bran cereals and leafy vegetables.  o Drink six (6) ounces of prune or apricot juice each morning.  o Eat two (2) large servings of stewed fruit each day.  o Take one (1) heaping tablespoon of a psyllium-based bulking agent twice a day. Use sugar-free sweetener when possible to avoid excessive calories.  o Eat a normal  breakfast.  o Set aside 15 minutes after breakfast to sit on the toilet, but do not strain to have a bowel movement.  o If you do not have a bowel movement by the third day, use an enema and repeat the above steps.    Controlling diarrhea o Switch to liquids and simpler foods for a few days to avoid stressing your intestines further. o Avoid dairy products (especially milk & ice cream) for a short time.  The intestines often can lose the ability to digest lactose when stressed. o Avoid foods that cause gassiness or bloating.  Typical foods include beans and other legumes, cabbage, broccoli, and dairy foods.  Every person has some sensitivity to other foods, so listen to our body and avoid those foods that trigger problems for you. o Adding fiber (Citrucel, Metamucil,  psyllium, Miralax) gradually can help thicken stools by absorbing excess fluid and retrain the intestines to act more normally.  Slowly increase the dose over a few weeks.  Too much fiber too soon can backfire and cause cramping & bloating. o Probiotics (such as active yogurt, Align, etc) may help repopulate the intestines and colon with normal bacteria and calm down a sensitive digestive tract.  Most studies show it to be of mild help, though, and such products can be costly. o Medicines:   Bismuth subsalicylate (ex. Kayopectate, Pepto Bismol) every 30 minutes for up to 6 doses can help control diarrhea.  Avoid if pregnant.   Loperamide (Immodium) can slow down diarrhea.  Start with two tablets (4mg  total) first and then try one tablet every 6 hours.  Avoid if you are having fevers or severe pain.  If you are not better or start feeling worse, stop all medicines and call your doctor for advice o Call your doctor if you are getting worse or not better.  Sometimes further testing (cultures, endoscopy, X-ray studies, bloodwork, etc) may be needed to help diagnose and treat the cause of the diarrhea.
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
## 2014-03-30 ENCOUNTER — Other Ambulatory Visit (INDEPENDENT_AMBULATORY_CARE_PROVIDER_SITE_OTHER): Payer: Self-pay | Admitting: Surgery

## 2014-03-30 ENCOUNTER — Ambulatory Visit: Admission: RE | Admit: 2014-03-30 | Payer: Medicare Other | Source: Ambulatory Visit

## 2014-03-30 ENCOUNTER — Ambulatory Visit
Admission: RE | Admit: 2014-03-30 | Discharge: 2014-03-30 | Disposition: A | Discharge status: Home / Self Care | Payer: Medicare Other | Source: Ambulatory Visit | Attending: Surgery | Admitting: Surgery

## 2014-03-30 ENCOUNTER — Other Ambulatory Visit: Payer: Medicare Other

## 2014-03-30 DIAGNOSIS — R1011 Right upper quadrant pain: Secondary | ICD-10-CM

## 2014-03-30 DIAGNOSIS — C541 Malignant neoplasm of endometrium: Secondary | ICD-10-CM

## 2014-03-30 DIAGNOSIS — G8929 Other chronic pain: Secondary | ICD-10-CM

## 2014-03-30 MED ORDER — IOHEXOL 300 MG/ML  SOLN
125.0000 mL | Freq: Once | INTRAMUSCULAR | Status: AC | PRN
  Administered 2014-03-30: 125 mL via INTRAVENOUS

## 2014-03-31 ENCOUNTER — Other Ambulatory Visit: Payer: Self-pay | Admitting: Cardiology

## 2014-03-31 ENCOUNTER — Telehealth (INDEPENDENT_AMBULATORY_CARE_PROVIDER_SITE_OTHER): Payer: Self-pay

## 2014-03-31 NOTE — Telephone Encounter (Signed)
Called pt to notify her that I did receive the CT scan but Dr Johney Maine has not gave me his recommendations on the scan yet. I just wanted to let her know that the scan does not show any abnormalities to explain her symptoms. I advised pt that Dr Johney Maine is at the hospital this week and I will work with him on Monday to go over the pt's information. I asked if the pt is following his recommendations about using the Tylenol qid with the heat/ice. The pt has been doing the Tylenol but not heat/ice. I advised pt that Dr Johney Maine would want her trying the Tylenol with heat/ice for 3 weeks to see if this discomfort is muscle pain more than gallstones. Pt understands now and will try heat/ice as well. I will call pt back next week after Dr Johney Maine reviews the scan. Pt understands.

## 2014-04-01 ENCOUNTER — Other Ambulatory Visit: Payer: Self-pay

## 2014-04-02 ENCOUNTER — Other Ambulatory Visit: Payer: Self-pay | Admitting: Cardiology

## 2014-04-04 NOTE — Telephone Encounter (Signed)
Called pt back to give her the report from the message below by Dr Johney Maine. I went over in details of the message below per Dr Johney Maine that the CT scan shows benign results. The pt is fine with the results. I asked how the pt was feeling and she replied that she is just fine. I advised pt if any concerns or questions to please call our office. Pt understands.

## 2014-04-04 NOTE — Telephone Encounter (Signed)
Message copied by Illene Regulus on Mon Apr 04, 2014 12:28 PM ------      Message from: [name]      Created: Thu Mar 31, 2014 10:36 AM       CT scan shows benign results: No cholecystitis.  Mild sludge in gallbladder unchanged.  No inflammation.  No evidence of biliary obstruction.  No bowel obstruction.  No hernia.  No inflamed intestine or colon.  No appendicitis.  No diverticulitis.  No kidney stone.  No pyelonephritis.  No evidence of gastric perforation or purulence ulceration.  Overall reassuring against any major abdominal catastrophe that requires surgical intervention.            Again suspect musculoskeletal etiology In the absence of history of biliary colic and normal HIDA scan without reproduction of symptoms = not the gallbladder.             Armanda Forand, CCS MA, please call on the patient to make sure recovery is going well & tell pt the good news on the CT^ scan arguing vs anything scary. Again Tylenol 4 times a day, heat or ice for 110min/application 6 times a day, Robaxin [phone] by mouth every 6 hours when necessary breakthrough cramping (can call in 40 pills w 1 refill).  See if that improves things.  She seems constipated.  Put her on a fiber bowel regimen.  If she is on something already (?Metamucil I believe), she should double the dosage or frequency.  See if that helps.                  [name], M.D., F.A.C.S.      Gastrointestinal and Minimally Invasive Surgery      [facility]      [address] [place]      [place], [place] [address]      [phone] Main / Paging             ------

## 2014-04-14 ENCOUNTER — Encounter (INDEPENDENT_AMBULATORY_CARE_PROVIDER_SITE_OTHER): Payer: Self-pay

## 2014-04-14 ENCOUNTER — Ambulatory Visit
Admission: RE | Admit: 2014-04-14 | Discharge: 2014-04-14 | Disposition: A | Discharge status: Home / Self Care | Payer: Medicare Other | Source: Ambulatory Visit

## 2014-04-14 DIAGNOSIS — Z1231 Encounter for screening mammogram for malignant neoplasm of breast: Secondary | ICD-10-CM

## 2014-04-21 ENCOUNTER — Other Ambulatory Visit: Payer: Self-pay | Admitting: *Deleted

## 2014-04-21 ENCOUNTER — Other Ambulatory Visit: Payer: Medicare Other

## 2014-04-21 ENCOUNTER — Ambulatory Visit: Payer: Medicare Other | Attending: Gynecologic Oncology | Admitting: Gynecologic Oncology

## 2014-04-21 ENCOUNTER — Encounter: Payer: Self-pay | Admitting: Gynecologic Oncology

## 2014-04-21 VITALS — BP 148/84 | HR 58 | Temp 98.0°F | Resp 22 | Ht 64.0 in | Wt 202.3 lb

## 2014-04-21 DIAGNOSIS — I4891 Unspecified atrial fibrillation: Secondary | ICD-10-CM | POA: Diagnosis not present

## 2014-04-21 DIAGNOSIS — I10 Essential (primary) hypertension: Secondary | ICD-10-CM | POA: Insufficient documentation

## 2014-04-21 DIAGNOSIS — N898 Other specified noninflammatory disorders of vagina: Secondary | ICD-10-CM | POA: Insufficient documentation

## 2014-04-21 DIAGNOSIS — Z8542 Personal history of malignant neoplasm of other parts of uterus: Secondary | ICD-10-CM | POA: Diagnosis not present

## 2014-04-21 DIAGNOSIS — Z90710 Acquired absence of both cervix and uterus: Secondary | ICD-10-CM | POA: Diagnosis not present

## 2014-04-21 DIAGNOSIS — C541 Malignant neoplasm of endometrium: Secondary | ICD-10-CM

## 2014-04-21 DIAGNOSIS — Z79899 Other long term (current) drug therapy: Secondary | ICD-10-CM | POA: Diagnosis not present

## 2014-04-21 DIAGNOSIS — R319 Hematuria, unspecified: Secondary | ICD-10-CM

## 2014-04-21 DIAGNOSIS — Z9079 Acquired absence of other genital organ(s): Secondary | ICD-10-CM | POA: Diagnosis not present

## 2014-04-21 DIAGNOSIS — Z8041 Family history of malignant neoplasm of ovary: Secondary | ICD-10-CM | POA: Diagnosis not present

## 2014-04-21 DIAGNOSIS — Z803 Family history of malignant neoplasm of breast: Secondary | ICD-10-CM | POA: Diagnosis not present

## 2014-04-21 DIAGNOSIS — K2940 Chronic atrophic gastritis without bleeding: Secondary | ICD-10-CM | POA: Diagnosis not present

## 2014-04-21 DIAGNOSIS — C549 Malignant neoplasm of corpus uteri, unspecified: Secondary | ICD-10-CM

## 2014-04-21 LAB — URINALYSIS, MICROSCOPIC - CHCC
BLOOD: NEGATIVE
Bilirubin (Urine): NEGATIVE
GLUCOSE UR CHCC: NEGATIVE mg/dL
KETONES: NEGATIVE mg/dL
Nitrite: NEGATIVE
Protein: NEGATIVE mg/dL
RBC / HPF: NEGATIVE (ref 0–2)
SPECIFIC GRAVITY, URINE: 1.005 (ref 1.003–1.035)
UROBILINOGEN UR: 0.2 mg/dL (ref 0.2–1)
pH: 7.5 (ref 4.6–8.0)

## 2014-04-21 NOTE — Patient Instructions (Signed)
Please follow up in 6 months with Dr. Alycia Rossetti. Please call our office in Oct to schedule your follow up.
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
## 2014-04-22 ENCOUNTER — Telehealth: Payer: Self-pay | Admitting: *Deleted

## 2014-04-22 NOTE — Telephone Encounter (Signed)
Notified pt, unable to reach . LMOVM

## 2014-04-22 NOTE — Telephone Encounter (Signed)
Message copied by GARNER, Aletha Halim on Fri Apr 22, 2014  1:40 PM ------      Message from: Everitt Amber C      Created: Thu Apr 21, 2014  8:07 PM       No evidence of blood in urine. Would you mind letting her know this?      Thanks      Everitt Amber ------

## 2014-06-07 DIAGNOSIS — K559 Vascular disorder of intestine, unspecified: Secondary | ICD-10-CM

## 2014-06-07 HISTORY — DX: Vascular disorder of intestine, unspecified: K55.9

## 2014-06-10 ENCOUNTER — Telehealth: Payer: Self-pay | Admitting: Internal Medicine

## 2014-06-10 DIAGNOSIS — R945 Abnormal results of liver function studies: Secondary | ICD-10-CM

## 2014-06-10 DIAGNOSIS — R7989 Other specified abnormal findings of blood chemistry: Secondary | ICD-10-CM

## 2014-06-10 MED ORDER — HYDROXYZINE HCL 50 MG PO TABS: ORAL_TABLET | ORAL | Status: DC

## 2014-06-10 NOTE — Telephone Encounter (Signed)
Spoke with [facility] and she states patient came in with itching and no rash. Itching started on palms. She has elevated LFT's and abdominal pain. Needs OV scheduled. She is faxing labs for Dr. Olevia Perches to review.

## 2014-06-10 NOTE — Telephone Encounter (Signed)
Left a message for [facility] to call back.

## 2014-06-10 NOTE — Telephone Encounter (Signed)
Spoke with Dr. Olevia Perches and patient needs:LFT's on Monday and ultrasound abdomen on Monday, Atarax 50 mg po every 4-6 hours prn itching then OV on Tuesday. Scheduled [place] on 06/13/14 at [facility] radiology at 8:30 AM. NPO 6 hours prior. Lab in [facility]. Rx sent. Scheduled patient on 06/14/14 at 9:00 AM. Patient aware of recommendations.

## 2014-06-13 ENCOUNTER — Other Ambulatory Visit (INDEPENDENT_AMBULATORY_CARE_PROVIDER_SITE_OTHER): Payer: Medicare Other

## 2014-06-13 ENCOUNTER — Encounter: Payer: Self-pay | Admitting: *Deleted

## 2014-06-13 ENCOUNTER — Ambulatory Visit (HOSPITAL_COMMUNITY)
Admission: RE | Admit: 2014-06-13 | Discharge: 2014-06-13 | Disposition: A | Discharge status: Home / Self Care | Payer: Medicare Other | Source: Ambulatory Visit | Attending: Internal Medicine | Admitting: Internal Medicine

## 2014-06-13 DIAGNOSIS — K8020 Calculus of gallbladder without cholecystitis without obstruction: Secondary | ICD-10-CM | POA: Insufficient documentation

## 2014-06-13 DIAGNOSIS — R7989 Other specified abnormal findings of blood chemistry: Secondary | ICD-10-CM | POA: Diagnosis present

## 2014-06-13 DIAGNOSIS — R109 Unspecified abdominal pain: Secondary | ICD-10-CM | POA: Diagnosis present

## 2014-06-13 DIAGNOSIS — R945 Abnormal results of liver function studies: Secondary | ICD-10-CM

## 2014-06-13 DIAGNOSIS — L299 Pruritus, unspecified: Secondary | ICD-10-CM | POA: Diagnosis present

## 2014-06-13 LAB — HEPATIC FUNCTION PANEL
ALK PHOS: 340 U/L — AB (ref 39–117)
ALT: 203 U/L — ABNORMAL HIGH (ref 0–35)
AST: 85 U/L — ABNORMAL HIGH (ref 0–37)
Albumin: 3.9 g/dL (ref 3.5–5.2)
BILIRUBIN DIRECT: 2.3 mg/dL — AB (ref 0.0–0.3)
BILIRUBIN TOTAL: 3.7 mg/dL — AB (ref 0.2–1.2)
TOTAL PROTEIN: 7.9 g/dL (ref 6.0–8.3)

## 2014-06-14 ENCOUNTER — Encounter: Payer: Self-pay | Admitting: Internal Medicine

## 2014-06-14 ENCOUNTER — Encounter (HOSPITAL_COMMUNITY): Payer: Self-pay | Admitting: Pharmacy Technician

## 2014-06-14 ENCOUNTER — Ambulatory Visit (INDEPENDENT_AMBULATORY_CARE_PROVIDER_SITE_OTHER): Payer: Medicare Other | Admitting: Internal Medicine

## 2014-06-14 ENCOUNTER — Other Ambulatory Visit (INDEPENDENT_AMBULATORY_CARE_PROVIDER_SITE_OTHER): Payer: Medicare Other

## 2014-06-14 ENCOUNTER — Encounter (HOSPITAL_COMMUNITY): Payer: Self-pay | Admitting: *Deleted

## 2014-06-14 VITALS — BP 118/76 | HR 56 | Ht 62.5 in | Wt 194.5 lb

## 2014-06-14 DIAGNOSIS — K831 Obstruction of bile duct: Secondary | ICD-10-CM

## 2014-06-14 DIAGNOSIS — R7989 Other specified abnormal findings of blood chemistry: Secondary | ICD-10-CM

## 2014-06-14 DIAGNOSIS — R945 Abnormal results of liver function studies: Principal | ICD-10-CM

## 2014-06-14 DIAGNOSIS — R17 Unspecified jaundice: Secondary | ICD-10-CM

## 2014-06-14 DIAGNOSIS — I4891 Unspecified atrial fibrillation: Secondary | ICD-10-CM

## 2014-06-14 DIAGNOSIS — K8020 Calculus of gallbladder without cholecystitis without obstruction: Secondary | ICD-10-CM

## 2014-06-14 LAB — PROTIME-INR
INR: 1.1 ratio — ABNORMAL HIGH (ref 0.8–1.0)
Prothrombin Time: 12.3 s (ref 9.6–13.1)

## 2014-06-14 LAB — HEPATIC FUNCTION PANEL
ALBUMIN: 4.1 g/dL (ref 3.5–5.2)
ALK PHOS: 342 U/L — AB (ref 39–117)
ALT: 180 U/L — ABNORMAL HIGH (ref 0–35)
AST: 81 U/L — ABNORMAL HIGH (ref 0–37)
Bilirubin, Direct: 2.6 mg/dL — ABNORMAL HIGH (ref 0.0–0.3)
Total Bilirubin: 4 mg/dL — ABNORMAL HIGH (ref 0.2–1.2)
Total Protein: 8.3 g/dL (ref 6.0–8.3)

## 2014-06-14 LAB — APTT: APTT: 40.5 s — AB (ref 23.4–32.7)

## 2014-06-14 MED ORDER — CIPROFLOXACIN HCL 500 MG PO TABS: 500.0000 mg | ORAL_TABLET | Freq: Two times a day (BID) | ORAL | Status: DC
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
## 2014-06-14 NOTE — Patient Instructions (Addendum)
We have sent the following medications to your pharmacy for you to pick up at your convenience: Cipro 500 mg twice daily x 7 days  Your physician has requested that you go to the basement for the following lab work before leaving today: Hepatic Function, PT, PTT  Please hold xarelto until after ERCP procedure.  You will be scheduled for your ERCP procedure tomorrow with Dr [name]. We will contact you today with further information.    Dr [name], Dr Clyda Greener Dr Florina Ou

## 2014-06-15 ENCOUNTER — Ambulatory Visit (HOSPITAL_COMMUNITY): Payer: Medicare Other

## 2014-06-15 ENCOUNTER — Encounter (HOSPITAL_COMMUNITY)
Admission: RE | Disposition: A | Discharge status: Home / Self Care | Payer: Self-pay | Source: Ambulatory Visit | Attending: Gastroenterology

## 2014-06-15 ENCOUNTER — Ambulatory Visit (HOSPITAL_BASED_OUTPATIENT_CLINIC_OR_DEPARTMENT_OTHER)
Admission: RE | Admit: 2014-06-15 | Discharge: 2014-06-15 | Disposition: A | Discharge status: Home / Self Care | Payer: Medicare Other | Source: Ambulatory Visit | Attending: Gastroenterology | Admitting: Gastroenterology

## 2014-06-15 ENCOUNTER — Ambulatory Visit (HOSPITAL_COMMUNITY): Payer: Medicare Other | Admitting: Certified Registered Nurse Anesthetist

## 2014-06-15 ENCOUNTER — Telehealth: Payer: Self-pay | Admitting: Gastroenterology

## 2014-06-15 ENCOUNTER — Encounter (HOSPITAL_COMMUNITY): Payer: Medicare Other | Admitting: Certified Registered Nurse Anesthetist

## 2014-06-15 ENCOUNTER — Telehealth: Payer: Self-pay

## 2014-06-15 ENCOUNTER — Encounter (HOSPITAL_COMMUNITY): Payer: Self-pay | Admitting: Gastroenterology

## 2014-06-15 DIAGNOSIS — K929 Disease of digestive system, unspecified: Secondary | ICD-10-CM | POA: Diagnosis not present

## 2014-06-15 DIAGNOSIS — R1013 Epigastric pain: Secondary | ICD-10-CM | POA: Diagnosis not present

## 2014-06-15 DIAGNOSIS — K831 Obstruction of bile duct: Secondary | ICD-10-CM

## 2014-06-15 DIAGNOSIS — K8020 Calculus of gallbladder without cholecystitis without obstruction: Secondary | ICD-10-CM

## 2014-06-15 DIAGNOSIS — K8050 Calculus of bile duct without cholangitis or cholecystitis without obstruction: Secondary | ICD-10-CM

## 2014-06-15 DIAGNOSIS — R7989 Other specified abnormal findings of blood chemistry: Secondary | ICD-10-CM

## 2014-06-15 DIAGNOSIS — R17 Unspecified jaundice: Secondary | ICD-10-CM

## 2014-06-15 DIAGNOSIS — R945 Abnormal results of liver function studies: Principal | ICD-10-CM

## 2014-06-15 HISTORY — PX: ERCP: SHX5425

## 2014-06-15 SURGERY — ERCP, WITH INTERVENTION IF INDICATED
Anesthesia: General

## 2014-06-15 SURGERY — ENDOSCOPIC RETROGRADE CHOLANGIOPANCREATOGRAPHY (ERCP) WITH PROPOFOL
Anesthesia: General

## 2014-06-15 MED ORDER — LIDOCAINE HCL (CARDIAC) 20 MG/ML IV SOLN
INTRAVENOUS | Status: AC
  Filled 2014-06-15: qty 5

## 2014-06-15 MED ORDER — SUCCINYLCHOLINE CHLORIDE 20 MG/ML IJ SOLN
INTRAMUSCULAR | Status: DC | PRN
  Administered 2014-06-15: 100 mg via INTRAVENOUS

## 2014-06-15 MED ORDER — LACTATED RINGERS IV SOLN: INTRAVENOUS | Status: DC

## 2014-06-15 MED ORDER — PROPOFOL 10 MG/ML IV BOLUS
INTRAVENOUS | Status: DC | PRN
  Administered 2014-06-15: 200 mg via INTRAVENOUS

## 2014-06-15 MED ORDER — INDOMETHACIN 50 MG RE SUPP
100.0000 mg | Freq: Once | RECTAL | Status: AC
  Administered 2014-06-15: 100 mg via RECTAL
  Filled 2014-06-15: qty 2

## 2014-06-15 MED ORDER — FENTANYL CITRATE 0.05 MG/ML IJ SOLN
INTRAMUSCULAR | Status: DC | PRN
  Administered 2014-06-15 (×2): 50 ug via INTRAVENOUS

## 2014-06-15 MED ORDER — MIDAZOLAM HCL 2 MG/2ML IJ SOLN
INTRAMUSCULAR | Status: AC
  Filled 2014-06-15: qty 2

## 2014-06-15 MED ORDER — PROPOFOL 10 MG/ML IV BOLUS
INTRAVENOUS | Status: AC
  Filled 2014-06-15: qty 20

## 2014-06-15 MED ORDER — MIDAZOLAM HCL 5 MG/5ML IJ SOLN
INTRAMUSCULAR | Status: DC | PRN
  Administered 2014-06-15: 2 mg via INTRAVENOUS

## 2014-06-15 MED ORDER — IOHEXOL 300 MG/ML  SOLN
INTRAMUSCULAR | Status: DC | PRN
  Administered 2014-06-15: 12:00:00

## 2014-06-15 MED ORDER — GLYCOPYRROLATE 0.2 MG/ML IJ SOLN
INTRAMUSCULAR | Status: AC
  Filled 2014-06-15: qty 1

## 2014-06-15 MED ORDER — LIDOCAINE HCL (CARDIAC) 20 MG/ML IV SOLN
INTRAVENOUS | Status: DC | PRN
  Administered 2014-06-15: 100 mg via INTRAVENOUS

## 2014-06-15 MED ORDER — ONDANSETRON HCL 4 MG/2ML IJ SOLN
INTRAMUSCULAR | Status: DC | PRN
  Administered 2014-06-15: 4 mg via INTRAVENOUS

## 2014-06-15 MED ORDER — LACTATED RINGERS IV SOLN
INTRAVENOUS | Status: DC
  Administered 2014-06-15: 1000 mL via INTRAVENOUS

## 2014-06-15 MED ORDER — CIPROFLOXACIN IN D5W 400 MG/200ML IV SOLN
400.0000 mg | Freq: Two times a day (BID) | INTRAVENOUS | Status: DC
Start: 2014-06-15 — End: 2014-06-16
  Administered 2014-06-15: 400 mg via INTRAVENOUS

## 2014-06-15 MED ORDER — SODIUM CHLORIDE 0.9 % IV SOLN: INTRAVENOUS | Status: DC

## 2014-06-15 MED ORDER — ONDANSETRON HCL 4 MG/2ML IJ SOLN
INTRAMUSCULAR | Status: AC
  Filled 2014-06-15: qty 2

## 2014-06-15 MED ORDER — CIPROFLOXACIN IN D5W 400 MG/200ML IV SOLN
INTRAVENOUS | Status: AC
  Filled 2014-06-15: qty 200

## 2014-06-15 MED ORDER — FENTANYL CITRATE 0.05 MG/ML IJ SOLN
INTRAMUSCULAR | Status: AC
  Filled 2014-06-15: qty 2

## 2014-06-15 MED ORDER — HYDROMORPHONE HCL 1 MG/ML IJ SOLN: 0.2500 mg | INTRAMUSCULAR | Status: DC | PRN

## 2014-06-15 MED ORDER — GLYCOPYRROLATE 0.2 MG/ML IJ SOLN
INTRAMUSCULAR | Status: DC | PRN
  Administered 2014-06-15: 0.2 mg via INTRAVENOUS

## 2014-06-15 NOTE — Anesthesia Postprocedure Evaluation (Signed)
  Anesthesia Post-op Note  Patient: [name]  Procedure(s) Performed: Procedure(s) (LRB): ENDOSCOPIC RETROGRADE CHOLANGIOPANCREATOGRAPHY (ERCP) (N/A)  Patient Location: PACU  Anesthesia Type: General  Level of Consciousness: awake and alert   Airway and Oxygen Therapy: Patient Spontanous Breathing  Post-op Pain: mild  Post-op Assessment: Post-op Vital signs reviewed, Patient's Cardiovascular Status Stable, Respiratory Function Stable, Patent Airway and No signs of Nausea or vomiting  Last Vitals:  Filed Vitals:   06/15/14 1220  BP:   Pulse: 72  Temp:   Resp: 13    Post-op Vital Signs: stable   Complications: No apparent anesthesia complications

## 2014-06-15 NOTE — Anesthesia Preprocedure Evaluation (Addendum)
Anesthesia Evaluation  Patient identified by MRN, date of birth, ID band Patient awake    Reviewed: Allergy & Precautions, H&P , NPO status , Patient's Chart, lab work & pertinent test results, reviewed documented beta blocker date and time   Airway Mallampati: III TM Distance: >3 FB Neck ROM: Full    Dental  (+) Edentulous Upper, Dental Advisory Given   Pulmonary neg pulmonary ROS, sleep apnea , pneumonia -,  breath sounds clear to auscultation  Pulmonary exam normal       Cardiovascular hypertension, Pt. on medications and Pt. on home beta blockers + dysrhythmias Atrial Fibrillation Rhythm:Regular Rate:Normal     Neuro/Psych negative neurological ROS  negative psych ROS   GI/Hepatic negative GI ROS, Neg liver ROS, GERD-  ,  Endo/Other  negative endocrine ROS  Renal/GU negative Renal ROS  negative genitourinary   Musculoskeletal negative musculoskeletal ROS (+)   Abdominal (+) + obese,   Peds negative pediatric ROS (+)  Hematology negative hematology ROS (+)   Anesthesia Other Findings   Reproductive/Obstetrics negative OB ROS                          Anesthesia Physical Anesthesia Plan  ASA: III  Anesthesia Plan: General   Post-op Pain Management:    Induction: Intravenous  Airway Management Planned: Oral ETT  Additional Equipment:   Intra-op Plan:   Post-operative Plan: Extubation in OR  Informed Consent: I have reviewed the patients History and Physical, chart, labs and discussed the procedure including the risks, benefits and alternatives for the proposed anesthesia with the patient or authorized representative who has indicated his/her understanding and acceptance.   Dental Advisory Given  Plan Discussed with: CRNA and Surgeon  Anesthesia Plan Comments:         Anesthesia Quick Evaluation
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
## 2014-06-15 NOTE — Op Note (Signed)
Pentax endoscopy writing software not functioning today and so this will serve as the ERCP report.  ERCP performed in OR with general anesthesia, she was given 100 mg rectal indomethacin and 400mg  IV cipro during the case.  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  Proximally the biliary tree was diffusely dilated (CBD up to 38mm).  There were no evident stones in the bile duct.  I attempted to brush the stricture with cytology brushes but 2 of them malfunctioned and so I proceeded with placement of 4cm long, 7mm diameter fully covered metal stent across the stricture. There was immediate flow of dark bile through the stent following it's placement. The distal most 29mm of the stent extended into the duodenum in good position.  The main pancreatic duct was never cannulated with wire or injected with dye.  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  Unclear etiology of the stricture, CT scan 2 months ago showed normal pancreas.  Recommendation: My office will arrange MRCP, MRI pancreatic protocol and labs (ca 19-9 and repeat LFTs next week) to continue workup.

## 2014-06-15 NOTE — Discharge Instructions (Signed)

## 2014-06-15 NOTE — Interval H&P Note (Signed)
History and Physical Interval Note:  06/15/2014 8:55 AM  [name]  has presented today for surgery, with the diagnosis of stones  The various methods of treatment have been discussed with the patient and family. After consideration of risks, benefits and other options for treatment, the patient has consented to  Procedure(s): ENDOSCOPIC RETROGRADE CHOLANGIOPANCREATOGRAPHY (ERCP) (N/A) as a surgical intervention .  The patient's history has been reviewed, patient examined, no change in status, stable for surgery.  I have reviewed the patient's chart and labs.  Questions were answered to the patient's satisfaction.     Milus Banister

## 2014-06-15 NOTE — Telephone Encounter (Signed)
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  See my ERCP report (not yet written).  I'm concerned about ? Mass.  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            Macdonald Rigor,      She needs MRCP and MRI with iv contrast, pancreatic protocol; diagnosis distal bile duct stricture. Also can you have her come to office early next week for labs (LFTs, CA 19-9).             Pending that, she will probably need upper EUS.            She'll be leaving hosp in next 1-2 hours.            thanks ------

## 2014-06-15 NOTE — Transfer of Care (Signed)
Immediate Anesthesia Transfer of Care Note  Patient: [name]  Procedure(s) Performed: Procedure(s) (LRB): ENDOSCOPIC RETROGRADE CHOLANGIOPANCREATOGRAPHY (ERCP) (N/A)  Patient Location: PACU  Anesthesia Type: General  Level of Consciousness: sedated, patient cooperative and responds to stimulation  Airway & Oxygen Therapy: Patient Spontanous Breathing and Patient connected to face mask oxgen  Post-op Assessment: Report given to PACU RN and Post -op Vital signs reviewed and stable  Post vital signs: Reviewed and stable  Complications: No apparent anesthesia complications

## 2014-06-15 NOTE — Telephone Encounter (Signed)
Pt had questions regarding her medications following the ERCP. Pt states the discharge sheet states to discontinue cipro, pt wanted to know if that means she is not to take that. Discussed with pt that if it said to dc it means to not take it. Pt verbalized understanding.

## 2014-06-16 ENCOUNTER — Inpatient Hospital Stay (HOSPITAL_COMMUNITY)
Admission: EM | Admit: 2014-06-16 | Discharge: 2014-06-19 | DRG: 393 | Disposition: A | Discharge status: Home / Self Care | Payer: Medicare Other | Attending: Gastroenterology | Admitting: Gastroenterology

## 2014-06-16 ENCOUNTER — Emergency Department (HOSPITAL_COMMUNITY): Payer: Medicare Other

## 2014-06-16 ENCOUNTER — Encounter (HOSPITAL_COMMUNITY): Payer: Self-pay | Admitting: Emergency Medicine

## 2014-06-16 DIAGNOSIS — Z800 Family history of malignant neoplasm of digestive organs: Secondary | ICD-10-CM

## 2014-06-16 DIAGNOSIS — M129 Arthropathy, unspecified: Secondary | ICD-10-CM | POA: Diagnosis present

## 2014-06-16 DIAGNOSIS — K2940 Chronic atrophic gastritis without bleeding: Secondary | ICD-10-CM | POA: Diagnosis present

## 2014-06-16 DIAGNOSIS — Z8249 Family history of ischemic heart disease and other diseases of the circulatory system: Secondary | ICD-10-CM

## 2014-06-16 DIAGNOSIS — Z803 Family history of malignant neoplasm of breast: Secondary | ICD-10-CM

## 2014-06-16 DIAGNOSIS — Z885 Allergy status to narcotic agent status: Secondary | ICD-10-CM

## 2014-06-16 DIAGNOSIS — I10 Essential (primary) hypertension: Secondary | ICD-10-CM | POA: Diagnosis present

## 2014-06-16 DIAGNOSIS — R7989 Other specified abnormal findings of blood chemistry: Secondary | ICD-10-CM | POA: Diagnosis present

## 2014-06-16 DIAGNOSIS — K8020 Calculus of gallbladder without cholecystitis without obstruction: Secondary | ICD-10-CM | POA: Diagnosis present

## 2014-06-16 DIAGNOSIS — K859 Acute pancreatitis without necrosis or infection, unspecified: Secondary | ICD-10-CM | POA: Diagnosis present

## 2014-06-16 DIAGNOSIS — Z8542 Personal history of malignant neoplasm of other parts of uterus: Secondary | ICD-10-CM

## 2014-06-16 DIAGNOSIS — Z8041 Family history of malignant neoplasm of ovary: Secondary | ICD-10-CM

## 2014-06-16 DIAGNOSIS — N200 Calculus of kidney: Secondary | ICD-10-CM | POA: Diagnosis present

## 2014-06-16 DIAGNOSIS — Z823 Family history of stroke: Secondary | ICD-10-CM

## 2014-06-16 DIAGNOSIS — Z860100 Personal history of colon polyps, unspecified: Secondary | ICD-10-CM

## 2014-06-16 DIAGNOSIS — Z6834 Body mass index (BMI) 34.0-34.9, adult: Secondary | ICD-10-CM

## 2014-06-16 DIAGNOSIS — E669 Obesity, unspecified: Secondary | ICD-10-CM | POA: Diagnosis present

## 2014-06-16 DIAGNOSIS — Z833 Family history of diabetes mellitus: Secondary | ICD-10-CM

## 2014-06-16 DIAGNOSIS — I4891 Unspecified atrial fibrillation: Secondary | ICD-10-CM | POA: Diagnosis present

## 2014-06-16 DIAGNOSIS — Y849 Medical procedure, unspecified as the cause of abnormal reaction of the patient, or of later complication, without mention of misadventure at the time of the procedure: Secondary | ICD-10-CM | POA: Diagnosis present

## 2014-06-16 DIAGNOSIS — K831 Obstruction of bile duct: Secondary | ICD-10-CM | POA: Diagnosis present

## 2014-06-16 DIAGNOSIS — Z79899 Other long term (current) drug therapy: Secondary | ICD-10-CM

## 2014-06-16 DIAGNOSIS — K929 Disease of digestive system, unspecified: Principal | ICD-10-CM | POA: Diagnosis present

## 2014-06-16 DIAGNOSIS — K219 Gastro-esophageal reflux disease without esophagitis: Secondary | ICD-10-CM | POA: Diagnosis present

## 2014-06-16 DIAGNOSIS — K858 Other acute pancreatitis without necrosis or infection: Secondary | ICD-10-CM

## 2014-06-16 DIAGNOSIS — N1330 Unspecified hydronephrosis: Secondary | ICD-10-CM | POA: Diagnosis present

## 2014-06-16 DIAGNOSIS — Z882 Allergy status to sulfonamides status: Secondary | ICD-10-CM

## 2014-06-16 HISTORY — DX: Vascular disorder of intestine, unspecified: K55.9

## 2014-06-16 LAB — CBC WITH DIFFERENTIAL/PLATELET
Basophils Absolute: 0 10*3/uL (ref 0.0–0.1)
Basophils Relative: 0 % (ref 0–1)
Eosinophils Absolute: 0.1 10*3/uL (ref 0.0–0.7)
Eosinophils Relative: 1 % (ref 0–5)
HCT: 34.2 % — ABNORMAL LOW (ref 36.0–46.0)
Hemoglobin: 12 g/dL (ref 12.0–15.0)
Lymphocytes Relative: 14 % (ref 12–46)
Lymphs Abs: 1.2 10*3/uL (ref 0.7–4.0)
MCH: 30.4 pg (ref 26.0–34.0)
MCHC: 35.1 g/dL (ref 30.0–36.0)
MCV: 86.6 fL (ref 78.0–100.0)
Monocytes Absolute: 0.7 10*3/uL (ref 0.1–1.0)
Monocytes Relative: 8 % (ref 3–12)
Neutro Abs: 6.9 10*3/uL (ref 1.7–7.7)
Neutrophils Relative %: 77 % (ref 43–77)
Platelets: 251 10*3/uL (ref 150–400)
RBC: 3.95 MIL/uL (ref 3.87–5.11)
RDW: 13.9 % (ref 11.5–15.5)
WBC: 9 10*3/uL (ref 4.0–10.5)

## 2014-06-16 LAB — COMPREHENSIVE METABOLIC PANEL
ALT: 146 U/L — ABNORMAL HIGH (ref 0–35)
AST: 73 U/L — ABNORMAL HIGH (ref 0–37)
Albumin: 3.8 g/dL (ref 3.5–5.2)
Alkaline Phosphatase: 340 U/L — ABNORMAL HIGH (ref 39–117)
Anion gap: 15 (ref 5–15)
BUN: 23 mg/dL (ref 6–23)
CO2: 21 mEq/L (ref 19–32)
Calcium: 10 mg/dL (ref 8.4–10.5)
Chloride: 94 mEq/L — ABNORMAL LOW (ref 96–112)
Creatinine, Ser: 1.13 mg/dL — ABNORMAL HIGH (ref 0.50–1.10)
GFR calc Af Amer: 57 mL/min — ABNORMAL LOW (ref >90)
GFR calc non Af Amer: 49 mL/min — ABNORMAL LOW (ref >90)
Glucose, Bld: 204 mg/dL — ABNORMAL HIGH (ref 70–99)
Potassium: 4.4 mEq/L (ref 3.7–5.3)
Sodium: 130 mEq/L — ABNORMAL LOW (ref 137–147)
Total Bilirubin: 3.7 mg/dL — ABNORMAL HIGH (ref 0.3–1.2)
Total Protein: 7.7 g/dL (ref 6.0–8.3)

## 2014-06-16 LAB — LIPASE, BLOOD: Lipase: 914 U/L — ABNORMAL HIGH (ref 11–59)

## 2014-06-16 MED ORDER — ACETAMINOPHEN 325 MG PO TABS
650.0000 mg | ORAL_TABLET | Freq: Four times a day (QID) | ORAL | Status: DC | PRN
  Administered 2014-06-16 – 2014-06-18 (×6): 650 mg via ORAL
  Filled 2014-06-16 (×6): qty 2

## 2014-06-16 MED ORDER — FLECAINIDE ACETATE 100 MG PO TABS
100.0000 mg | ORAL_TABLET | Freq: Two times a day (BID) | ORAL | Status: DC
  Administered 2014-06-16 – 2014-06-19 (×7): 100 mg via ORAL
  Filled 2014-06-16 (×10): qty 1

## 2014-06-16 MED ORDER — SODIUM CHLORIDE 0.9 % IJ SOLN
3.0000 mL | Freq: Two times a day (BID) | INTRAMUSCULAR | Status: DC
  Administered 2014-06-16 (×2): 3 mL via INTRAVENOUS

## 2014-06-16 MED ORDER — CIPROFLOXACIN HCL 500 MG PO TABS
500.0000 mg | ORAL_TABLET | Freq: Two times a day (BID) | ORAL | Status: DC
  Filled 2014-06-16 (×3): qty 1

## 2014-06-16 MED ORDER — ONDANSETRON HCL 4 MG/2ML IJ SOLN
4.0000 mg | Freq: Once | INTRAMUSCULAR | Status: AC
  Administered 2014-06-16: 4 mg via INTRAVENOUS
  Filled 2014-06-16: qty 2

## 2014-06-16 MED ORDER — CETYLPYRIDINIUM CHLORIDE 0.05 % MT LIQD
7.0000 mL | Freq: Two times a day (BID) | OROMUCOSAL | Status: DC
Start: 2014-06-16 — End: 2014-06-19
  Administered 2014-06-16 – 2014-06-18 (×3): 7 mL via OROMUCOSAL

## 2014-06-16 MED ORDER — FENTANYL CITRATE 0.05 MG/ML IJ SOLN
100.0000 ug | Freq: Once | INTRAMUSCULAR | Status: AC
  Administered 2014-06-16: 100 ug via INTRAVENOUS
  Filled 2014-06-16: qty 2

## 2014-06-16 MED ORDER — SODIUM CHLORIDE 0.9 % IJ SOLN: 3.0000 mL | INTRAMUSCULAR | Status: DC | PRN

## 2014-06-16 MED ORDER — ZOLPIDEM TARTRATE 5 MG PO TABS
5.0000 mg | ORAL_TABLET | Freq: Every evening | ORAL | Status: DC | PRN
  Administered 2014-06-18: 5 mg via ORAL
  Filled 2014-06-16: qty 1

## 2014-06-16 MED ORDER — ONDANSETRON HCL 4 MG/2ML IJ SOLN: 4.0000 mg | Freq: Four times a day (QID) | INTRAMUSCULAR | Status: DC | PRN

## 2014-06-16 MED ORDER — CHLORHEXIDINE GLUCONATE 0.12 % MT SOLN
15.0000 mL | Freq: Two times a day (BID) | OROMUCOSAL | Status: DC
  Administered 2014-06-16 – 2014-06-19 (×5): 15 mL via OROMUCOSAL
  Filled 2014-06-16 (×8): qty 15

## 2014-06-16 MED ORDER — LOSARTAN POTASSIUM 50 MG PO TABS
50.0000 mg | ORAL_TABLET | Freq: Every morning | ORAL | Status: DC
  Administered 2014-06-16 – 2014-06-19 (×4): 50 mg via ORAL
  Filled 2014-06-16 (×4): qty 1

## 2014-06-16 MED ORDER — PANTOPRAZOLE SODIUM 40 MG PO TBEC
40.0000 mg | DELAYED_RELEASE_TABLET | Freq: Every day | ORAL | Status: DC
  Administered 2014-06-16 – 2014-06-19 (×4): 40 mg via ORAL
  Filled 2014-06-16 (×4): qty 1

## 2014-06-16 MED ORDER — SODIUM CHLORIDE 0.9 % IV BOLUS (SEPSIS)
1000.0000 mL | Freq: Once | INTRAVENOUS | Status: AC
  Administered 2014-06-16: 1000 mL via INTRAVENOUS

## 2014-06-16 MED ORDER — HYDROMORPHONE HCL 1 MG/ML IJ SOLN
1.0000 mg | INTRAMUSCULAR | Status: DC | PRN
  Administered 2014-06-16 – 2014-06-18 (×8): 1 mg via INTRAVENOUS
  Filled 2014-06-16 (×8): qty 1

## 2014-06-16 MED ORDER — ONDANSETRON HCL 4 MG PO TABS: 4.0000 mg | ORAL_TABLET | Freq: Four times a day (QID) | ORAL | Status: DC | PRN

## 2014-06-16 MED ORDER — ACETAMINOPHEN 650 MG RE SUPP: 650.0000 mg | Freq: Four times a day (QID) | RECTAL | Status: DC | PRN

## 2014-06-16 MED ORDER — RIVAROXABAN 20 MG PO TABS
20.0000 mg | ORAL_TABLET | Freq: Every day | ORAL | Status: DC
  Administered 2014-06-16: 20 mg via ORAL
  Filled 2014-06-16 (×2): qty 1

## 2014-06-16 MED ORDER — INFLUENZA VAC SPLIT QUAD 0.5 ML IM SUSY
0.5000 mL | PREFILLED_SYRINGE | INTRAMUSCULAR | Status: DC
  Filled 2014-06-16 (×2): qty 0.5

## 2014-06-16 MED ORDER — SODIUM CHLORIDE 0.9 % IV SOLN: 250.0000 mL | INTRAVENOUS | Status: DC | PRN

## 2014-06-16 MED ORDER — SODIUM CHLORIDE 0.9 % IV SOLN: INTRAVENOUS | Status: AC

## 2014-06-16 MED ORDER — METOPROLOL SUCCINATE ER 25 MG PO TB24
25.0000 mg | ORAL_TABLET | Freq: Every morning | ORAL | Status: DC
  Administered 2014-06-16 – 2014-06-19 (×4): 25 mg via ORAL
  Filled 2014-06-16 (×4): qty 1

## 2014-06-16 NOTE — ED Notes (Signed)
EKG given to EDP, Harris,MD., for review.
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
## 2014-06-16 NOTE — ED Notes (Signed)
Pt presents with severe upper abd pain radiating up back under shoulder blades, pt underwent ERCP yesterday with Dr. [name], began having severe pain overnight, has attempting walking with no relief. Attempts to contact office unsuccessful. +nausea.

## 2014-06-16 NOTE — Telephone Encounter (Signed)
The pt is still currently admitted and can not be scheduled for imaging until discharged.  I spoke with Dr [name] and he states to wait until Monday to schedule

## 2014-06-16 NOTE — ED Notes (Signed)
Bed: WA17 Expected date:  Expected time:  Means of arrival:  Comments: [name]

## 2014-06-16 NOTE — ED Notes (Signed)
Pt states she had a ERCP yesterday  Pt states about 2130 last night she started having some discomfort  Pt states she feels extremely bloated and swollen  Pt states the pain goes up into her chest and to her back between her shoulder blades  Pt states she ambulated around her house but it did not help  Pt states she tried to call the on call dr but could not get anyone to answer
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
## 2014-06-16 NOTE — ED Provider Notes (Signed)
68 year old female, history of ERCP with a biliary stent placed yesterday by Dr. [name], presents back to the hospital with a complaint of abdominal pain and specifically upper abdominal pain. On exam the patient has diffuse mild tenderness but focused primarily in the upper abdomen, no guarding, no peritoneal signs. Heart and lungs without acute findings, mild bradycardia, no peripheral edema, does not appear in acute distress after receiving intravenous pain medications. Laboratory workup confirms post ERCP pancreatitis, liver function panel not significantly changed from prior levels, no significant leukocytosis, contact GI, possible admission for pancreatitis.  Medical screening examination/treatment/procedure(s) were conducted as a shared visit with non-physician practitioner(s) and myself.  I personally evaluated the patient during the encounter.  Clinical Impression:   Final diagnoses:  Other acute pancreatitis     Johnna Acosta, MD 06/17/14 [phone]

## 2014-06-17 DIAGNOSIS — K929 Disease of digestive system, unspecified: Secondary | ICD-10-CM | POA: Diagnosis present

## 2014-06-17 DIAGNOSIS — R1013 Epigastric pain: Secondary | ICD-10-CM | POA: Diagnosis present

## 2014-06-17 DIAGNOSIS — K2940 Chronic atrophic gastritis without bleeding: Secondary | ICD-10-CM | POA: Diagnosis present

## 2014-06-17 DIAGNOSIS — Z8542 Personal history of malignant neoplasm of other parts of uterus: Secondary | ICD-10-CM | POA: Diagnosis not present

## 2014-06-17 DIAGNOSIS — Z8249 Family history of ischemic heart disease and other diseases of the circulatory system: Secondary | ICD-10-CM | POA: Diagnosis not present

## 2014-06-17 DIAGNOSIS — Z833 Family history of diabetes mellitus: Secondary | ICD-10-CM | POA: Diagnosis not present

## 2014-06-17 DIAGNOSIS — R7989 Other specified abnormal findings of blood chemistry: Secondary | ICD-10-CM | POA: Diagnosis present

## 2014-06-17 DIAGNOSIS — K8020 Calculus of gallbladder without cholecystitis without obstruction: Secondary | ICD-10-CM | POA: Diagnosis present

## 2014-06-17 DIAGNOSIS — Z6834 Body mass index (BMI) 34.0-34.9, adult: Secondary | ICD-10-CM | POA: Diagnosis not present

## 2014-06-17 DIAGNOSIS — K219 Gastro-esophageal reflux disease without esophagitis: Secondary | ICD-10-CM | POA: Diagnosis present

## 2014-06-17 DIAGNOSIS — M129 Arthropathy, unspecified: Secondary | ICD-10-CM | POA: Diagnosis present

## 2014-06-17 DIAGNOSIS — Z800 Family history of malignant neoplasm of digestive organs: Secondary | ICD-10-CM | POA: Diagnosis not present

## 2014-06-17 DIAGNOSIS — E669 Obesity, unspecified: Secondary | ICD-10-CM | POA: Diagnosis present

## 2014-06-17 DIAGNOSIS — Z79899 Other long term (current) drug therapy: Secondary | ICD-10-CM | POA: Diagnosis not present

## 2014-06-17 DIAGNOSIS — Z885 Allergy status to narcotic agent status: Secondary | ICD-10-CM | POA: Diagnosis not present

## 2014-06-17 DIAGNOSIS — N1330 Unspecified hydronephrosis: Secondary | ICD-10-CM | POA: Diagnosis present

## 2014-06-17 DIAGNOSIS — Z823 Family history of stroke: Secondary | ICD-10-CM | POA: Diagnosis not present

## 2014-06-17 DIAGNOSIS — K859 Acute pancreatitis without necrosis or infection, unspecified: Secondary | ICD-10-CM | POA: Diagnosis present

## 2014-06-17 DIAGNOSIS — Z8041 Family history of malignant neoplasm of ovary: Secondary | ICD-10-CM | POA: Diagnosis not present

## 2014-06-17 DIAGNOSIS — Y849 Medical procedure, unspecified as the cause of abnormal reaction of the patient, or of later complication, without mention of misadventure at the time of the procedure: Secondary | ICD-10-CM | POA: Diagnosis present

## 2014-06-17 DIAGNOSIS — I4891 Unspecified atrial fibrillation: Secondary | ICD-10-CM | POA: Diagnosis present

## 2014-06-17 DIAGNOSIS — N200 Calculus of kidney: Secondary | ICD-10-CM | POA: Diagnosis present

## 2014-06-17 DIAGNOSIS — Z803 Family history of malignant neoplasm of breast: Secondary | ICD-10-CM | POA: Diagnosis not present

## 2014-06-17 DIAGNOSIS — Z86010 Personal history of colonic polyps: Secondary | ICD-10-CM | POA: Diagnosis not present

## 2014-06-17 DIAGNOSIS — Z882 Allergy status to sulfonamides status: Secondary | ICD-10-CM | POA: Diagnosis not present

## 2014-06-17 DIAGNOSIS — I10 Essential (primary) hypertension: Secondary | ICD-10-CM | POA: Diagnosis present

## 2014-06-17 DIAGNOSIS — K831 Obstruction of bile duct: Secondary | ICD-10-CM | POA: Diagnosis present

## 2014-06-17 LAB — COMPREHENSIVE METABOLIC PANEL
ALBUMIN: 3.3 g/dL — AB (ref 3.5–5.2)
ALK PHOS: 289 U/L — AB (ref 39–117)
ALT: 114 U/L — AB (ref 0–35)
AST: 62 U/L — ABNORMAL HIGH (ref 0–37)
Anion gap: 14 (ref 5–15)
BILIRUBIN TOTAL: 2.9 mg/dL — AB (ref 0.3–1.2)
BUN: 22 mg/dL (ref 6–23)
CHLORIDE: 99 meq/L (ref 96–112)
CO2: 22 mEq/L (ref 19–32)
Calcium: 9.3 mg/dL (ref 8.4–10.5)
Creatinine, Ser: 0.92 mg/dL (ref 0.50–1.10)
GFR calc Af Amer: 73 mL/min — ABNORMAL LOW (ref >90)
GFR calc non Af Amer: 63 mL/min — ABNORMAL LOW (ref >90)
Glucose, Bld: 100 mg/dL — ABNORMAL HIGH (ref 70–99)
POTASSIUM: 4.5 meq/L (ref 3.7–5.3)
SODIUM: 135 meq/L — AB (ref 137–147)
Total Protein: 6.8 g/dL (ref 6.0–8.3)

## 2014-06-17 LAB — LIPASE, BLOOD: LIPASE: 544 U/L — AB (ref 11–59)

## 2014-06-17 MED ORDER — RIVAROXABAN 20 MG PO TABS
20.0000 mg | ORAL_TABLET | Freq: Every day | ORAL | Status: DC
  Administered 2014-06-17 – 2014-06-18 (×2): 20 mg via ORAL
  Filled 2014-06-17 (×3): qty 1

## 2014-06-17 MED ORDER — SODIUM CHLORIDE 0.9 % IV SOLN: INTRAVENOUS | Status: DC

## 2014-06-17 MED ORDER — INFLUENZA VAC SPLIT QUAD 0.5 ML IM SUSY
0.5000 mL | PREFILLED_SYRINGE | INTRAMUSCULAR | Status: AC | PRN
  Administered 2014-06-19: 0.5 mL via INTRAMUSCULAR
  Filled 2014-06-17 (×2): qty 0.5

## 2014-06-17 MED ORDER — SODIUM CHLORIDE 0.45 % IV SOLN
INTRAVENOUS | Status: DC
  Administered 2014-06-17 – 2014-06-18 (×2): via INTRAVENOUS
  Filled 2014-06-17 (×4): qty 1000

## 2014-06-17 NOTE — Progress Notes (Signed)
[facility] Progress Note  Subjective:  Feels much better than yesterday.  Has been walking around the halls.  No nausea or vomiting.  Pain is less now and pain medication does help when she takes it.  Went from 7 pm yesterday to 2 am this morning without pain medication.  Objective:  Vital signs in last 24 hours: Temp:  [98 F (36.7 C)-98.7 F (37.1 C)] 98.7 F (37.1 C) (09/25 0604) Pulse Rate:  [51-65] 63 (09/25 0604) Resp:  [16-18] 18 (09/25 0604) BP: (94-160)/(48-76) 115/67 mmHg (09/25 0604) SpO2:  [98 %-100 %] 100 % (09/25 0604) Weight:  [194 lb 8 oz (88.225 kg)] 194 lb 8 oz (88.225 kg) (09/24 0955) Last BM Date: 06/14/14 General:  Alert, Well-developed, in NAD Heart:  Regular rate and rhythm; no murmurs Pulm:  CTAB.  No W/R/R. Abdomen:  Soft, non-distended.  BS present.  Epigastric and RUQ TTP without  R/R/G, but improved. Extremities:  Without edema. Neurologic:  Alert and  oriented x4;  grossly normal neurologically. Psych:  Alert and cooperative. Normal mood and affect.  Intake/Output from previous day: 09/24 0701 - 09/25 0700 In: 3 [I.V.:3] Out: 1800 [Urine:1800]  Lab Results:  Recent Labs  06/16/14 0701  WBC 9.0  HGB 12.0  HCT 34.2*  PLT 251   BMET  Recent Labs  06/16/14 0701 06/17/14 0456  NA 130* 135*  K 4.4 4.5  CL 94* 99  CO2 21 22  GLUCOSE 204* 100*  BUN 23 22  CREATININE 1.13* 0.92  CALCIUM 10.0 9.3   LFT  Recent Labs  06/14/14 1024  06/17/14 0456  PROT 8.3  < > 6.8  ALBUMIN 4.1  < > 3.3*  AST 81*  < > 62*  ALT 180*  < > 114*  ALKPHOS 342*  < > 289*  BILITOT 4.0*  < > 2.9*  BILIDIR 2.6*  --   --   < > = values in this interval not displayed. PT/INR  Recent Labs  06/14/14 1024  LABPROT 12.3  INR 1.1*   Dg Ercp Biliary & Pancreatic Ducts  06/15/2014   CLINICAL DATA:  Biliary dilatation, cholelithiasis  EXAM: ERCP with distal CBD stent insertion  TECHNIQUE: Multiple spot images obtained with the  fluoroscopic device and submitted for interpretation post-procedure.  COMPARISON:  06/13/2014, 03/30/2014  FINDINGS: Limited intraoperative cholangiogram during the ERCP. Spot fluoroscopic views demonstrate cannulation and guidewire access of the bile duct. Contrast injection demonstrates diffuse biliary dilatation. Distal CBD stricture noted on several images. This was successfully crossed and treated with a covered metallic stent.  IMPRESSION: Distal CBD stricture, successfully treated with a covered metallic stent.  These images were submitted for radiologic interpretation only. Please see the procedural report for the amount of contrast and the fluoroscopy time utilized.   Electronically Signed   By: Daryll Brod M.D.   On: 06/15/2014 14:24   Dg Abd Acute W/chest  06/16/2014   CLINICAL DATA:  Abdominal pain post stent placement  EXAM: ACUTE ABDOMEN SERIES (ABDOMEN 2 VIEW & CHEST 1 VIEW)  COMPARISON:  Chest radiograph June 17, 2012; CT abdomen and pelvis March 30, 2014  FINDINGS: PA chest: There is no edema or consolidation. Heart size and pulmonary vascularity are normal. No adenopathy. 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  Supine and upright abdomen: There is a biliary stent in the medial right upper quadrant. There is moderate stool in the colon. The bowel gas pattern is unremarkable without  obstruction or free air. Air in the biliary ductal system is consistent with recent stent placement. There is no free air or portal venous air apparent. There are phleboliths in the pelvis.  IMPRESSION: Air in the biliary ductal system is consistent with recent stent placement. Bowel gas pattern is unremarkable. No obstruction or free air apparent. Lungs are clear.   Electronically Signed   By: Lowella Grip M.D.   On: 06/16/2014 06:58    Assessment / Plan: -Post-ERCP pancreatitis: Acute, mild. S/p ERCP with stent placement on 9/23.  Improving.  Will reduce IVF's reduced to 100 cc/hr.  Will  give clear liquids today.  Continue pain control.  *Suspect possible discharge tomorrow, 9/26, once we are sure patient is tolerating clear and likely full liquid diet.        LOS: 1 day   ZEHR, JESSICA D.  06/17/2014, 8:46 AM  Pager number [phone]   ________________________________________________________________________  [facility] MD note:  I personally examined the patient, reviewed the data and agree with the assessment and plan described above.  Jello caused some abd discomfort.  Reports no flatus but BS +.  Will decrease back to NPO with sips h20. IV fluids 150.     Owens Loffler, MD [facility] Pager [phone]

## 2014-06-17 NOTE — ED Provider Notes (Signed)
Medical screening examination/treatment/procedure(s) were conducted as a shared visit with non-physician practitioner(s) and myself.  I personally evaluated the patient during the encounter  Please see my separate respective documentation pertaining to this patient encounter   Johnna Acosta, MD 06/17/14 [phone]

## 2014-06-17 NOTE — Discharge Summary (Signed)
[name] Discharge Summary  Name: [name] MRN: [id number] DOB: 21-Aug-1946 68 y.o. PCP:  [facility] Ou, MD  Date of Admission: 06/16/2014  5:35 AM Date of Discharge: 06/19/2014 Attending Physician: Savannah Banister, MD  Discharge Diagnosis: Active Problems:   Post ERCP pancreatitis   S/p ERCP with placement of bare metal stent.   Elevated LFTs.    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     Consultations:  None  Admission HPI:  [name] is a pleasant 68 year old woman.  She has hx of paroxysmal A fib and takes Xarelto for this.  Her regular GI MD, [name], saw pt on 9/22 for 5 day hx of itching and jaundice.  Labs showed bili of 2.5 and elevated alk phos and AST/ALT.  Stools were pale.   She had been seen for similar physical complaints in May 2015. Ultrasound then showed cholelithiasis, 5 mm CBD, no cholecystitis but HIDA scan was normal. LFTs and Lipase also normal.  She underwent EGD on 03/08/2014.  This revealed minimal antral gastritis wich was biopsied.  Pathology showed focal intestinal metaplasia consistent with chronic gastritis. She was maintained on PPI.  She was admitted to [facility] ([name])  with hematochezia 9/15 A non-contrasted CT scan abdomen at [facility] on 06/06/2014 which showed punctate renal stone and mild associated hydronephrosis.  The GB, pancreas and bile ducts were unremarkable.  Some scattered tics were seen in the colon.  Flexible sigmoidoscopy of 06/07/14 showed tics in sigmoid and segments of congested, ulcerated mucosa in sigmoid. LFTs were normal. Sigmoid biopsy pathology showed ischemic changes.   After the 06/14/14 visit, she was set up for ERCP with Dr [name] on 9/23. She held her Xarelto before the procedure. A long, tight, distal CBD stricture was revealed and treated with placement of fully covered metal stent after malfunctioning of 2 cytology brushes.  MD was not able to obtain brush  cytologies.  Etiology of stricture remains undetermined. She received Indocin per rectum as prophylactic against post ERCP pancreatitis.  She was discharge to home in stable condition that afternoon.  Within several hours of returning home she developed abdominal pain.  She was awakened ~ 2130 with upper abdominal pain radiating to her back and chest.  She proceeded to the ED where Lipase was 914.  LFTs were stable to slightly improved c/w 9/22.  She was admitted for management of post ERCP pancreatitis.     Hospital Course by problem list: 1.  Post ERCP pancreatitis Treatment consisted of aggressive IVF hydration, Dilaudid for pain mgt and NPO.  On 9/25 she had not required many doses of the Dilaudid and pain had improved, so clear liquids were initiated.  Diet was advanced to low fat on 9/26 and she continued to have milder levels of pain so she was kept one more night.  In AM of 9/27 she was doing well and had not had abdominal pain overnight, though she was still tender to abdominal exam.  She had had bowel movements that morning.  She was felt stable for discharge.   2.  Stricture of bile duct.  Stenting of bile duct had taken place on 9/23.  Nature of stricture has yet to be determined.  No CT imaging or ultrasound has been obtained since her non-contrasted CT scan at outside hospital on 06/06/14.  Outpt labs to check CA 19-9 along with repeat hepatic function profile have been arranged at [facility] lab.  Dr [name]  staff will also be contacting pt with information regarding MRI/MRCP.    Procedures Performed:   9/23 ERCP by Dr Owens Loffler.  1.5cm long distal bile duct stricture, stented with 4cm long, 66m diameter fully covered metal stent. Unclear etiology of the stricture, CT scan 2 months ago showed normal pancreas. Attempts to brush stricture failed due to malfunctioning of 2 cytology brushes.     RADIOLOGIC STUDIES [place]Abdomen Complete 06/13/2014    COMPARISON:  Abdominal pelvic CT  03/30/2014.  FINDINGS: Gallbladder:  The gallbladder is distended and filled with stones. There is no gallbladder wall thickening or pericholecystic fluid. Sonographic Murphy's sign is absent.  Common bile duct:  Diameter: 15.8 mm. No intraductal calculi visible. The distal duct is obscured by bowel gas. There is mild intrahepatic biliary dilatation.  Liver:  No focal lesion identified. Within normal limits in parenchymal echogenicity.  IVC:  No abnormality visualized.  Pancreas:  Visualized portion unremarkable.  Spleen:  Size and appearance within normal limits.  Right Kidney:  Length: 11.2 cm. Echogenicity within normal limits. No mass or hydronephrosis visualized.  Left Kidney:  Length: 11.2 cm. Echogenicity within normal limits. No mass or hydronephrosis visualized.  Abdominal aorta:  No aneurysm visualized.  Other findings:  None.  IMPRESSION: 1. Cholelithiasis with intra and extrahepatic biliary dilatation suspicious for choledocholithiasis. No evidence of cholecystitis. Consider further evaluation with MRCP. 2. No evidence of pancreatic head mass or pancreatic ductal dilatation.   Electronically Signed   By: BCamie PatienceM.D.   On: 06/13/2014 08:37   Dg Ercp Biliary & Pancreatic Ducts 06/15/2014   CLINICAL DATA:  Biliary dilatation, cholelithiasis  EXAM: ERCP with distal CBD stent insertion  TECHNIQUE: Multiple spot images obtained with the fluoroscopic device and submitted for interpretation post-procedure.  COMPARISON:  06/13/2014, 03/30/2014  FINDINGS: Limited intraoperative cholangiogram during the ERCP. Spot fluoroscopic views demonstrate cannulation and guidewire access of the bile duct. Contrast injection demonstrates diffuse biliary dilatation. Distal CBD stricture noted on several images. This was successfully crossed and treated with a covered metallic stent.  IMPRESSION: Distal CBD stricture, successfully treated with a covered metallic stent.  These images were submitted for radiologic  interpretation only. Please see the procedural report for the amount of contrast and the fluoroscopy time utilized.   Electronically Signed   By: TDaryll BrodM.D.   On: 06/15/2014 14:24   Dg Abd Acute W/chest 06/16/2014   CLINICAL DATA:  Abdominal pain post stent placement  EXAM: ACUTE ABDOMEN SERIES (ABDOMEN 2 VIEW & CHEST 1 VIEW)  COMPARISON:  Chest radiograph June 17, 2012; CT abdomen and pelvis March 30, 2014  FINDINGS: PA chest: There is no edema or consolidation. Heart size and pulmonary vascularity are normal. No adenopathy. 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  Supine and upright abdomen: There is a biliary stent in the medial right upper quadrant. There is moderate stool in the colon. The bowel gas pattern is unremarkable without obstruction or free air. Air in the biliary ductal system is consistent with recent stent placement. There is no free air or portal venous air apparent. There are phleboliths in the pelvis.  IMPRESSION: Air in the biliary ductal system is consistent with recent stent placement. Bowel gas pattern is unremarkable. No obstruction or free air apparent. Lungs are clear.   Electronically Signed   By: WLowella GripM.D.   On: 06/16/2014 06:58      Discharge Vitals:  BP 115/67  Pulse 63  Temp(Src) 98.7  F (37.1 C) (Oral)  Resp 18  Ht 5' 2.5" (1.588 m)  Wt 194 lb 8 oz (88.225 kg)  BMI 34.99 kg/m2  SpO2 100%  Discharge Labs:  Results for orders placed during the hospital encounter of 06/16/14 (from the past 24 hour(s))  COMPREHENSIVE METABOLIC PANEL     Status: Abnormal   Collection Time    06/17/14  4:56 AM      Result Value Ref Range   Sodium 135 (*) 137 - 147 mEq/L   Potassium 4.5  3.7 - 5.3 mEq/L   Chloride 99  96 - 112 mEq/L   CO2 22  19 - 32 mEq/L   Glucose, Bld 100 (*) 70 - 99 mg/dL   BUN 22  6 - 23 mg/dL   Creatinine, Ser 0.92  0.50 - 1.10 mg/dL   Calcium 9.3  8.4 - 10.5 mg/dL   Total Protein 6.8  6.0 - 8.3 g/dL   Albumin 3.3 (*)  3.5 - 5.2 g/dL   AST 62 (*) 0 - 37 U/L   ALT 114 (*) 0 - 35 U/L   Alkaline Phosphatase 289 (*) 39 - 117 U/L   Total Bilirubin 2.9 (*) 0.3 - 1.2 mg/dL   GFR calc non Af Amer 63 (*) >90 mL/min   GFR calc Af Amer 73 (*) >90 mL/min   Anion gap 14  5 - 15  LIPASE, BLOOD     Status: Abnormal   Collection Time    06/17/14  4:56 AM      Result Value Ref Range   Lipase 544 (*) 11 - 59 U/L    Disposition and follow-up:   Ms.Lesley S Killian was discharged from [facility] in stable condition.    Follow-up Appointments: CA 19-9 and hepatic function profile labs will be obtained at [facility] next week.  Outpt labs to check CA 19-9 along with repeat hepatic function profile have been arranged at [facility] lab.  Dr [name] staff will also be contacting pt with information regarding MRI/MRCP. Dr [name] office will notify pt of future appointments.  He, rather than Dr Olevia Perches, will be following the biliary stricture.    Discharge Medications:   Medication List    ASK your doctor about these medications       acetaminophen 500 MG tablet  Commonly known as:  TYLENOL  Take 1,000 mg by mouth every 6 (six) hours as needed for pain.     CENTRUM SILVER PO  Take 1 tablet by mouth every morning.     ciprofloxacin 500 MG tablet  Commonly known as:  CIPRO  Take 500 mg by mouth 2 (two) times daily.     dicyclomine 10 MG capsule  Commonly known as:  BENTYL  Take 10 mg by mouth 3 (three) times daily as needed for spasms.     Eszopiclone 3 MG Tabs  Take 1.5 mg by mouth at bedtime. Take immediately before bedtime. Lunesta.     flecainide 100 MG tablet  Commonly known as:  TAMBOCOR  Take 100 mg by mouth 2 (two) times daily.     hydrOXYzine 50 MG tablet  Commonly known as:  ATARAX/VISTARIL  Take 50 mg by mouth. Every four to six hours as needed for itching.     losartan 50 MG tablet  Commonly known as:  COZAAR  Take 50 mg by mouth every morning.     metoprolol succinate 25 MG 24 hr  tablet  Commonly known as:  TOPROL-XL  Take 25 mg by mouth every morning.     omeprazole 20 MG capsule  Commonly known as:  PRILOSEC  Take 1 tablet 30 minutes before breakfast daily     psyllium 28 % packet  Commonly known as:  METAMUCIL SMOOTH TEXTURE  Take 1 packet by mouth daily.     rivaroxaban 20 MG Tabs tablet  Commonly known as:  XARELTO  Take 20 mg by mouth at bedtime.     vitamin C 500 MG tablet  Commonly known as:  ASCORBIC ACID  Take 500 mg by mouth daily.        Signed: Azucena Freed, PA-C

## 2014-06-18 LAB — COMPREHENSIVE METABOLIC PANEL
ALBUMIN: 2.9 g/dL — AB (ref 3.5–5.2)
ALK PHOS: 244 U/L — AB (ref 39–117)
ALT: 84 U/L — ABNORMAL HIGH (ref 0–35)
AST: 44 U/L — ABNORMAL HIGH (ref 0–37)
Anion gap: 14 (ref 5–15)
BUN: 18 mg/dL (ref 6–23)
CO2: 20 mEq/L (ref 19–32)
Calcium: 8.8 mg/dL (ref 8.4–10.5)
Chloride: 99 mEq/L (ref 96–112)
Creatinine, Ser: 0.73 mg/dL (ref 0.50–1.10)
GFR calc non Af Amer: 86 mL/min — ABNORMAL LOW (ref >90)
GLUCOSE: 67 mg/dL — AB (ref 70–99)
POTASSIUM: 3.8 meq/L (ref 3.7–5.3)
SODIUM: 133 meq/L — AB (ref 137–147)
TOTAL PROTEIN: 6.4 g/dL (ref 6.0–8.3)
Total Bilirubin: 2.8 mg/dL — ABNORMAL HIGH (ref 0.3–1.2)

## 2014-06-18 LAB — CBC
HEMATOCRIT: 28.5 % — AB (ref 36.0–46.0)
Hemoglobin: 9.8 g/dL — ABNORMAL LOW (ref 12.0–15.0)
MCH: 30.2 pg (ref 26.0–34.0)
MCHC: 34.4 g/dL (ref 30.0–36.0)
MCV: 88 fL (ref 78.0–100.0)
Platelets: 196 10*3/uL (ref 150–400)
RBC: 3.24 MIL/uL — AB (ref 3.87–5.11)
RDW: 14.3 % (ref 11.5–15.5)
WBC: 10.2 10*3/uL (ref 4.0–10.5)

## 2014-06-18 MED ORDER — HYDROMORPHONE HCL 2 MG PO TABS
2.0000 mg | ORAL_TABLET | ORAL | Status: DC | PRN
  Administered 2014-06-18: 2 mg via ORAL
  Filled 2014-06-18: qty 1

## 2014-06-18 NOTE — Progress Notes (Signed)
[facility] Progress Note    Since last GI note: Flatus +, feeling better overall but has still needed IV pain meds. Ambulating in halls.  Hungry, wants to advance diet again  Objective: Vital signs in last 24 hours: Temp:  [98.2 F (36.8 C)-99.3 F (37.4 C)] 98.2 F (36.8 C) (09/26 0627) Pulse Rate:  [65-70] 70 (09/26 0627) Resp:  [18] 18 (09/26 0627) BP: (114-139)/(51-69) 122/51 mmHg (09/26 0627) SpO2:  [97 %-100 %] 97 % (09/26 0627) Last BM Date: 06/14/14 General: alert and oriented times 3 Heart: regular rate and rythm Abdomen: soft, non-tender, non-distended, normal bowel sounds   Lab Results:  Recent Labs  06/16/14 0701 06/18/14 0540  WBC 9.0 10.2  HGB 12.0 9.8*  PLT 251 196  MCV 86.6 88.0    Recent Labs  06/16/14 0701 06/17/14 0456 06/18/14 0540  NA 130* 135* 133*  K 4.4 4.5 3.8  CL 94* 99 99  CO2 21 22 20   GLUCOSE 204* 100* 67*  BUN 23 22 18   CREATININE 1.13* 0.92 0.73  CALCIUM 10.0 9.3 8.8    Recent Labs  06/16/14 0701 06/17/14 0456 06/18/14 0540  PROT 7.7 6.8 6.4  ALBUMIN 3.8 3.3* 2.9*  AST 73* 62* 44*  ALT 146* 114* 84*  ALKPHOS 340* 289* 244*  BILITOT 3.7* 2.9* 2.8*    Medications: Scheduled Meds: . sodium chloride   Intravenous STAT  . antiseptic oral rinse  7 mL Mouth Rinse q12n4p  . chlorhexidine  15 mL Mouth Rinse BID  . flecainide  100 mg Oral BID  . losartan  50 mg Oral q morning - 10a  . metoprolol succinate  25 mg Oral q morning - 10a  . pantoprazole  40 mg Oral Daily  . rivaroxaban  20 mg Oral Q supper  . sodium chloride  3 mL Intravenous Q12H   Continuous Infusions: . sodium chloride 0.45 % 1,000 mL infusion 100 mL/hr at 06/17/14 0930   PRN Meds:.sodium chloride, acetaminophen, acetaminophen, HYDROmorphone (DILAUDID) injection, Influenza vac split quadrivalent PF, ondansetron (ZOFRAN) IV, ondansetron, sodium chloride, zolpidem    Assessment/Plan: 68 y.o. female post ERCP pancreatitis  Improving,  will start clears and advance as tolerated.  Changing to PO pain meds.  Decreasing IV fluids.  Hopefully home tomorrow.    Milus Banister, MD  06/18/2014, 8:09 AM [facility] Pager [phone]

## 2014-06-19 ENCOUNTER — Encounter (HOSPITAL_COMMUNITY): Payer: Self-pay | Admitting: Physician Assistant

## 2014-06-19 DIAGNOSIS — K859 Acute pancreatitis without necrosis or infection, unspecified: Secondary | ICD-10-CM

## 2014-06-19 DIAGNOSIS — K831 Obstruction of bile duct: Secondary | ICD-10-CM

## 2014-06-19 DIAGNOSIS — K929 Disease of digestive system, unspecified: Secondary | ICD-10-CM | POA: Diagnosis not present

## 2014-06-19 NOTE — Progress Notes (Signed)
          Daily Rounding Note  06/19/2014, 9:06 AM  LOS: 3 days   SUBJECTIVE:       Tolerating solids.  Minor if any epigastric pain.  4 or so loose stools this AM.  Fells well.  Walked another mile (13 labs around floor) early this AM.   OBJECTIVE:         Vital signs in last 24 hours:    Temp:  [97.9 F (36.6 C)-99 F (37.2 C)] 98.2 F (36.8 C) (09/27 0518) Pulse Rate:  [56-70] 56 (09/27 0518) Resp:  [18] 18 (09/27 0518) BP: (103-118)/(54-65) 116/65 mmHg (09/27 0518) SpO2:  [97 %-100 %] 100 % (09/27 0518) Last BM Date: 06/14/14 Filed Weights   06/16/14 0955  Weight: 88.225 kg (194 lb 8 oz)   General: looks well.  NAD   Heart: RRR Chest: clear bil Abdomen: soft, mild epigastric tenderness  Extremities: no CCE Neuro/Psych:  Pleasant, relaxed.   Intake/Output from previous day: 09/26 0701 - 09/27 0700 In: 2780 [P.O.:480; I.V.:2300] Out: 1800 [Urine:1800]  Intake/Output this shift:    Lab Results:  Recent Labs  06/18/14 0540  WBC 10.2  HGB 9.8*  HCT 28.5*  PLT 196   BMET  Recent Labs  06/17/14 0456 06/18/14 0540  NA 135* 133*  K 4.5 3.8  CL 99 99  CO2 22 20  GLUCOSE 100* 67*  BUN 22 18  CREATININE 0.92 0.73  CALCIUM 9.3 8.8   LFT  Recent Labs  06/17/14 0456 06/18/14 0540  PROT 6.8 6.4  ALBUMIN 3.3* 2.9*  AST 62* 44*  ALT 114* 84*  ALKPHOS 289* 244*  BILITOT 2.9* 2.8*     ASSESMENT:   *  Pst ERCP pancreatitis, resolving.   *  S/p 9/24 ERCP with finding of tight distal bile duct stricture, treated with placement of covered metal stent.  Unable to succesfully obtain brush cytologies.  CA 19-9 to be obtained as outpt, post recovery.   *  Cholelithiasis, intra and  Extra hepatic biliary ductal dilatation on ultrasound of 9/21.    PLAN   *  Discharge home today.     [name]  06/19/2014, 9:06 AM Pager: [phone]

## 2014-06-19 NOTE — Progress Notes (Signed)
________________________________________________________________________  Velora Heckler GI MD note:  I reviewed the data and agree with the assessment and plan described above.  She had left the hosp before I came to see her today.   Owens Loffler, MD [facility] Pager [phone]

## 2014-06-19 NOTE — Progress Notes (Signed)
Assessment unchanged. Pt and husband verbalized understanding of dc instructions through teach back. No scripts at dc. Discharged via wc to front entrance to meet awaiting vehicle to pt carry home. Accompanied by Husband and NT.

## 2014-06-20 NOTE — Telephone Encounter (Signed)
You have been scheduled for an MRI at [facility] on 06/24/14. Your appointment time is 9 am. Please arrive 15 minutes prior to your appointment time for registration purposes. Please make certain not to have anything to eat or drink 4 hours prior to your test. In addition, if you have any metal in your body, have a pacemaker or defibrillator, please be sure to let your ordering physician know. This test typically takes 45 minutes to 1 hour to complete. Please have labs this week at our basement lab any day 730-5 pm.  The pt is aware and will have labs this week and keep appt for MRCP

## 2014-06-21 ENCOUNTER — Other Ambulatory Visit (INDEPENDENT_AMBULATORY_CARE_PROVIDER_SITE_OTHER): Payer: Medicare Other

## 2014-06-21 DIAGNOSIS — K831 Obstruction of bile duct: Secondary | ICD-10-CM

## 2014-06-21 LAB — HEPATIC FUNCTION PANEL
ALK PHOS: 186 U/L — AB (ref 39–117)
ALT: 58 U/L — ABNORMAL HIGH (ref 0–35)
AST: 45 U/L — AB (ref 0–37)
Albumin: 3.2 g/dL — ABNORMAL LOW (ref 3.5–5.2)
Bilirubin, Direct: 0.7 mg/dL — ABNORMAL HIGH (ref 0.0–0.3)
Total Bilirubin: 1.3 mg/dL — ABNORMAL HIGH (ref 0.2–1.2)
Total Protein: 7 g/dL (ref 6.0–8.3)

## 2014-06-22 ENCOUNTER — Telehealth: Payer: Self-pay | Admitting: Cardiology

## 2014-06-22 LAB — CANCER ANTIGEN 19-9: CA 19-9: 237.5 U/mL — ABNORMAL HIGH (ref <35.0)

## 2014-06-22 NOTE — Telephone Encounter (Signed)
New problem    Pt has a question about her heart rate being low in the 40's. Want a possible medication adjustment. Please call pt.

## 2014-06-22 NOTE — Telephone Encounter (Signed)
Can decrease Toprol XL to 12.5 mg daily.

## 2014-06-22 NOTE — Telephone Encounter (Signed)
Patient reports she was told her heart rate was in the 40's during an ERCp recently. Taking Metoprolol 25 XL daily and Flecainide bid. Other than occasional "fluttering and thumping" she is asymptomatic. Has appointment with you in November.

## 2014-06-23 NOTE — Telephone Encounter (Signed)
Patient informed to decrease Metoprolol to 12.5 mg daily. She voices understanding.

## 2014-06-24 ENCOUNTER — Other Ambulatory Visit: Payer: Self-pay | Admitting: Gastroenterology

## 2014-06-24 ENCOUNTER — Ambulatory Visit (HOSPITAL_COMMUNITY)
Admission: RE | Admit: 2014-06-24 | Discharge: 2014-06-24 | Disposition: A | Discharge status: Home / Self Care | Payer: Medicare Other | Source: Ambulatory Visit | Attending: Diagnostic Radiology | Admitting: Diagnostic Radiology

## 2014-06-24 DIAGNOSIS — K831 Obstruction of bile duct: Secondary | ICD-10-CM

## 2014-06-24 DIAGNOSIS — K8080 Other cholelithiasis without obstruction: Secondary | ICD-10-CM | POA: Diagnosis not present

## 2014-06-24 DIAGNOSIS — K859 Acute pancreatitis, unspecified: Secondary | ICD-10-CM | POA: Insufficient documentation

## 2014-06-24 DIAGNOSIS — K838 Other specified diseases of biliary tract: Secondary | ICD-10-CM | POA: Diagnosis not present

## 2014-06-24 DIAGNOSIS — K868 Other specified diseases of pancreas: Secondary | ICD-10-CM | POA: Diagnosis not present

## 2014-06-24 MED ORDER — GADOBENATE DIMEGLUMINE 529 MG/ML IV SOLN
17.0000 mL | Freq: Once | INTRAVENOUS | Status: AC | PRN
  Administered 2014-06-24: 17 mL via INTRAVENOUS

## 2014-06-25 ENCOUNTER — Other Ambulatory Visit: Payer: Self-pay | Admitting: Internal Medicine

## 2014-06-27 ENCOUNTER — Other Ambulatory Visit: Payer: Self-pay

## 2014-06-27 DIAGNOSIS — K861 Other chronic pancreatitis: Secondary | ICD-10-CM

## 2014-06-30 ENCOUNTER — Ambulatory Visit: Payer: Medicare Other | Admitting: Gynecologic Oncology

## 2014-07-06 ENCOUNTER — Other Ambulatory Visit: Payer: Self-pay | Admitting: Cardiology

## 2014-07-11 ENCOUNTER — Encounter: Payer: Self-pay | Admitting: Gastroenterology

## 2014-07-11 ENCOUNTER — Ambulatory Visit (INDEPENDENT_AMBULATORY_CARE_PROVIDER_SITE_OTHER): Payer: Medicare Other | Admitting: Gastroenterology

## 2014-07-11 ENCOUNTER — Other Ambulatory Visit (INDEPENDENT_AMBULATORY_CARE_PROVIDER_SITE_OTHER): Payer: Medicare Other

## 2014-07-11 VITALS — BP 110/62 | HR 56 | Ht 62.5 in | Wt 189.2 lb

## 2014-07-11 DIAGNOSIS — K831 Obstruction of bile duct: Secondary | ICD-10-CM

## 2014-07-11 DIAGNOSIS — K861 Other chronic pancreatitis: Secondary | ICD-10-CM

## 2014-07-11 LAB — CANCER ANTIGEN 19-9: CA 19-9: 324.5 U/mL — ABNORMAL HIGH (ref <35.0)

## 2014-07-11 LAB — HEPATIC FUNCTION PANEL
ALBUMIN: 3.5 g/dL (ref 3.5–5.2)
ALT: 20 U/L (ref 0–35)
AST: 22 U/L (ref 0–37)
Alkaline Phosphatase: 101 U/L (ref 39–117)
Bilirubin, Direct: 0.2 mg/dL (ref 0.0–0.3)
TOTAL PROTEIN: 7.6 g/dL (ref 6.0–8.3)
Total Bilirubin: 0.8 mg/dL (ref 0.2–1.2)

## 2014-07-11 LAB — COMPREHENSIVE METABOLIC PANEL
ALT: 20 U/L (ref 0–35)
AST: 22 U/L (ref 0–37)
Albumin: 3.5 g/dL (ref 3.5–5.2)
Alkaline Phosphatase: 101 U/L (ref 39–117)
BILIRUBIN TOTAL: 0.8 mg/dL (ref 0.2–1.2)
BUN: 16 mg/dL (ref 6–23)
CALCIUM: 9.5 mg/dL (ref 8.4–10.5)
CHLORIDE: 101 meq/L (ref 96–112)
CO2: 28 mEq/L (ref 19–32)
CREATININE: 0.7 mg/dL (ref 0.4–1.2)
GFR: 94.87 mL/min (ref >60.00)
Glucose, Bld: 130 mg/dL — ABNORMAL HIGH (ref 70–99)
Potassium: 4.1 mEq/L (ref 3.5–5.1)
SODIUM: 138 meq/L (ref 135–145)
TOTAL PROTEIN: 7.6 g/dL (ref 6.0–8.3)

## 2014-07-11 LAB — AMYLASE: AMYLASE: 91 U/L (ref 27–131)

## 2014-07-11 LAB — PROTIME-INR
INR: 1.1 ratio — ABNORMAL HIGH (ref 0.8–1.0)
Prothrombin Time: 12.6 s (ref 9.6–13.1)

## 2014-07-11 LAB — LIPASE: Lipase: 79 U/L — ABNORMAL HIGH (ref 11.0–59.0)

## 2014-07-11 MED ORDER — ONDANSETRON HCL 4 MG PO TABS: 4.0000 mg | ORAL_TABLET | Freq: Two times a day (BID) | ORAL | Status: DC

## 2014-07-11 NOTE — Patient Instructions (Addendum)
You will have labs checked today in the basement lab.  Please head down after you check out with the front desk  (cmet, inr, lipase, amylase, CA 19-9). You will be set up for a CT scan of abdomen and pelvis with IV and oral contrast (pancreatic protocol). You have been scheduled for a CT scan of the abdomen and pelvis at [facility] ([address]---this is in the same building as Press [facility]).   You are scheduled on 07/14/14 at 2 pm. You should arrive 30 minutes prior to your appointment time for registration. Please follow the written instructions below on the day of your exam:  WARNING: IF YOU ARE ALLERGIC TO IODINE/X-RAY DYE, PLEASE NOTIFY RADIOLOGY IMMEDIATELY AT [phone]! YOU WILL BE GIVEN A 13 HOUR PREMEDICATION PREP.  1) Do not eat or drink anything after 10 am (4 hours prior to your test) This test typically takes 30-45 minutes to complete.  If you have any questions regarding your exam or if you need to reschedule, you may call the CT department at [phone] between the hours of 8:00 am and 5:00 pm, Monday-Friday.  ________________________________________________________________________  [name] will be set up for an upper endoscopic ultrasound and ERCP with Dr. [name] for bile duct structure, stent exchange. Zofran prescription, take one pill twice daily. Take one gas ex pill with every meal.
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
## 2014-07-13 ENCOUNTER — Encounter (HOSPITAL_COMMUNITY): Payer: Self-pay | Admitting: Pharmacy Technician

## 2014-07-14 ENCOUNTER — Other Ambulatory Visit: Payer: Self-pay | Admitting: Gastroenterology

## 2014-07-14 ENCOUNTER — Ambulatory Visit (INDEPENDENT_AMBULATORY_CARE_PROVIDER_SITE_OTHER)
Admission: RE | Admit: 2014-07-14 | Discharge: 2014-07-14 | Disposition: A | Discharge status: Home / Self Care | Payer: Medicare Other | Source: Ambulatory Visit | Attending: Gastroenterology | Admitting: Gastroenterology

## 2014-07-14 DIAGNOSIS — K831 Obstruction of bile duct: Secondary | ICD-10-CM

## 2014-07-14 MED ORDER — IOHEXOL 300 MG/ML  SOLN
100.0000 mL | Freq: Once | INTRAMUSCULAR | Status: AC | PRN
  Administered 2014-07-14: 100 mL via INTRAVENOUS

## 2014-07-15 ENCOUNTER — Telehealth: Payer: Self-pay | Admitting: Gastroenterology

## 2014-07-15 NOTE — Telephone Encounter (Signed)
Pt calling for CT results. Please advise. 

## 2014-07-16 ENCOUNTER — Encounter: Payer: Self-pay | Admitting: Gastroenterology

## 2014-07-18 ENCOUNTER — Encounter (HOSPITAL_COMMUNITY): Payer: Self-pay | Admitting: *Deleted

## 2014-07-18 NOTE — Telephone Encounter (Signed)
Dr [name] is on vacation this week I will inform the pt.

## 2014-07-20 ENCOUNTER — Encounter: Payer: Self-pay | Admitting: Gastroenterology

## 2014-07-25 ENCOUNTER — Telehealth: Payer: Self-pay | Admitting: Gastroenterology

## 2014-07-25 NOTE — Telephone Encounter (Signed)
Pt aware and response resent as well

## 2014-07-25 NOTE — Telephone Encounter (Signed)
Alycia,  I have been out of town the past week. Sorry for the delay in answering. The fluid in, around your pancreas is NOT related to congestive heart failure and it does not need to be removed. I will decide during the procedures on Thursday if you still need a stent, I suspect that you will.

## 2014-07-27 NOTE — Anesthesia Preprocedure Evaluation (Addendum)
Anesthesia Evaluation  Patient identified by MRN, date of birth, ID band Patient awake    Reviewed: Allergy & Precautions, H&P , NPO status , Patient's Chart, lab work & pertinent test results  History of Anesthesia Complications Negative for: history of anesthetic complications  Airway Mallampati: II  TM Distance: >3 FB Neck ROM: Full    Dental no notable dental hx. (+) Upper Dentures, Missing, Dental Advisory Given   Pulmonary sleep apnea ,  breath sounds clear to auscultation  Pulmonary exam normal       Cardiovascular Exercise Tolerance: Good hypertension, Pt. on medications and Pt. on home beta blockers + dysrhythmias Atrial Fibrillation Rhythm:Regular Rate:Normal     Neuro/Psych negative neurological ROS  negative psych ROS   GI/Hepatic Neg liver ROS, GERD-  Medicated and Controlled,  Endo/Other  negative endocrine ROS  Renal/GU negative Renal ROS  negative genitourinary   Musculoskeletal  (+) Arthritis -, Osteoarthritis,    Abdominal   Peds negative pediatric ROS (+)  Hematology negative hematology ROS (+)   Anesthesia Other Findings   Reproductive/Obstetrics negative OB ROS                            Anesthesia Physical Anesthesia Plan  ASA: III  Anesthesia Plan: General   Post-op Pain Management:    Induction: Intravenous  Airway Management Planned: Oral ETT  Additional Equipment:   Intra-op Plan:   Post-operative Plan: Extubation in OR  Informed Consent: I have reviewed the patients History and Physical, chart, labs and discussed the procedure including the risks, benefits and alternatives for the proposed anesthesia with the patient or authorized representative who has indicated his/her understanding and acceptance.   Dental advisory given  Plan Discussed with: CRNA  Anesthesia Plan Comments:         Anesthesia Quick Evaluation

## 2014-07-28 ENCOUNTER — Encounter (HOSPITAL_COMMUNITY)
Admission: RE | Disposition: A | Discharge status: Home / Self Care | Payer: Self-pay | Source: Ambulatory Visit | Attending: Gastroenterology

## 2014-07-28 ENCOUNTER — Telehealth: Payer: Self-pay

## 2014-07-28 ENCOUNTER — Ambulatory Visit (HOSPITAL_COMMUNITY): Payer: Medicare Other | Admitting: Anesthesiology

## 2014-07-28 ENCOUNTER — Encounter (HOSPITAL_COMMUNITY): Payer: Self-pay

## 2014-07-28 ENCOUNTER — Ambulatory Visit (HOSPITAL_COMMUNITY)
Admission: RE | Admit: 2014-07-28 | Discharge: 2014-07-28 | Disposition: A | Discharge status: Home / Self Care | Payer: Medicare Other | Source: Ambulatory Visit | Attending: Gastroenterology | Admitting: Gastroenterology

## 2014-07-28 ENCOUNTER — Telehealth: Payer: Self-pay | Admitting: Gastroenterology

## 2014-07-28 DIAGNOSIS — R17 Unspecified jaundice: Secondary | ICD-10-CM | POA: Insufficient documentation

## 2014-07-28 DIAGNOSIS — Z885 Allergy status to narcotic agent status: Secondary | ICD-10-CM | POA: Diagnosis not present

## 2014-07-28 DIAGNOSIS — K2950 Unspecified chronic gastritis without bleeding: Secondary | ICD-10-CM | POA: Insufficient documentation

## 2014-07-28 DIAGNOSIS — Z86010 Personal history of colonic polyps: Secondary | ICD-10-CM | POA: Diagnosis not present

## 2014-07-28 DIAGNOSIS — M1990 Unspecified osteoarthritis, unspecified site: Secondary | ICD-10-CM | POA: Diagnosis not present

## 2014-07-28 DIAGNOSIS — I10 Essential (primary) hypertension: Secondary | ICD-10-CM | POA: Diagnosis not present

## 2014-07-28 DIAGNOSIS — G4730 Sleep apnea, unspecified: Secondary | ICD-10-CM | POA: Insufficient documentation

## 2014-07-28 DIAGNOSIS — K831 Obstruction of bile duct: Secondary | ICD-10-CM | POA: Diagnosis present

## 2014-07-28 DIAGNOSIS — K648 Other hemorrhoids: Secondary | ICD-10-CM | POA: Diagnosis not present

## 2014-07-28 DIAGNOSIS — I4891 Unspecified atrial fibrillation: Secondary | ICD-10-CM | POA: Diagnosis not present

## 2014-07-28 DIAGNOSIS — I480 Paroxysmal atrial fibrillation: Secondary | ICD-10-CM | POA: Insufficient documentation

## 2014-07-28 DIAGNOSIS — K219 Gastro-esophageal reflux disease without esophagitis: Secondary | ICD-10-CM | POA: Diagnosis not present

## 2014-07-28 DIAGNOSIS — Z8589 Personal history of malignant neoplasm of other organs and systems: Secondary | ICD-10-CM | POA: Diagnosis not present

## 2014-07-28 DIAGNOSIS — K5790 Diverticulosis of intestine, part unspecified, without perforation or abscess without bleeding: Secondary | ICD-10-CM | POA: Diagnosis not present

## 2014-07-28 DIAGNOSIS — E669 Obesity, unspecified: Secondary | ICD-10-CM | POA: Diagnosis not present

## 2014-07-28 DIAGNOSIS — R634 Abnormal weight loss: Secondary | ICD-10-CM | POA: Insufficient documentation

## 2014-07-28 DIAGNOSIS — Z800 Family history of malignant neoplasm of digestive organs: Secondary | ICD-10-CM | POA: Insufficient documentation

## 2014-07-28 DIAGNOSIS — M5030 Other cervical disc degeneration, unspecified cervical region: Secondary | ICD-10-CM | POA: Insufficient documentation

## 2014-07-28 DIAGNOSIS — Z882 Allergy status to sulfonamides status: Secondary | ICD-10-CM | POA: Diagnosis not present

## 2014-07-28 DIAGNOSIS — C259 Malignant neoplasm of pancreas, unspecified: Secondary | ICD-10-CM

## 2014-07-28 DIAGNOSIS — C250 Malignant neoplasm of head of pancreas: Secondary | ICD-10-CM | POA: Diagnosis not present

## 2014-07-28 HISTORY — PX: EUS: SHX5427

## 2014-07-28 SURGERY — UPPER ENDOSCOPIC ULTRASOUND (EUS) LINEAR
Anesthesia: General

## 2014-07-28 MED ORDER — PROPOFOL 10 MG/ML IV BOLUS
INTRAVENOUS | Status: AC
  Filled 2014-07-28: qty 20

## 2014-07-28 MED ORDER — SODIUM CHLORIDE 0.9 % IV SOLN: INTRAVENOUS | Status: DC

## 2014-07-28 MED ORDER — PROPOFOL 10 MG/ML IV BOLUS
INTRAVENOUS | Status: DC | PRN
  Administered 2014-07-28 (×2): 50 mg via INTRAVENOUS
  Administered 2014-07-28: 25 mg via INTRAVENOUS
  Administered 2014-07-28: 50 mg via INTRAVENOUS
  Administered 2014-07-28: 25 mg via INTRAVENOUS
  Administered 2014-07-28 (×6): 50 mg via INTRAVENOUS
  Administered 2014-07-28: 25 mg via INTRAVENOUS

## 2014-07-28 MED ORDER — FENTANYL CITRATE 0.05 MG/ML IJ SOLN: 25.0000 ug | INTRAMUSCULAR | Status: DC | PRN

## 2014-07-28 MED ORDER — MIDAZOLAM HCL 2 MG/2ML IJ SOLN
INTRAMUSCULAR | Status: AC
Start: 2014-07-28 — End: 2014-07-28
  Filled 2014-07-28: qty 2

## 2014-07-28 MED ORDER — LACTATED RINGERS IV SOLN
INTRAVENOUS | Status: DC | PRN
  Administered 2014-07-28 (×2): via INTRAVENOUS

## 2014-07-28 MED ORDER — FENTANYL CITRATE 0.05 MG/ML IJ SOLN
INTRAMUSCULAR | Status: AC
  Filled 2014-07-28: qty 2

## 2014-07-28 MED ORDER — ONDANSETRON HCL 4 MG/2ML IJ SOLN: 4.0000 mg | Freq: Once | INTRAMUSCULAR | Status: DC | PRN

## 2014-07-28 NOTE — Telephone Encounter (Signed)
-----   Message from Milus Banister, MD sent at 07/28/2014  9:51 AM EST ----- Sydell Axon, just completed EUS, see below.   Virlan Kempker, She needs referral to Dr. Barry Dienes and Dr. [name] for newly diagnosed, potentially resectable pancreatic head cancer.  Can you also have her added to upcoming GI cancer conference.  Thanks   Aris Everts.   ENDOSCOPIC IMPRESSION: 2.6cm by 2.3cm mass in head of pancreas that causes biliary obstruction (previously stented), main pancreatic duct dilation but does not directly abut any signficant nearby blood vessels.  Preliminary cytology review is positive for malignancy (adenocarcinoma, EUS stage IB, T2N0M0).  Await final pathology report.  I will communicate these findings with Dr. Olevia Perches and will begin referrals to medical and surgical oncology.

## 2014-07-28 NOTE — Interval H&P Note (Signed)
History and Physical Interval Note:  07/28/2014 8:11 AM  [name]  has presented today for surgery, with the diagnosis of bile duct stricture, stent exchange  The various methods of treatment have been discussed with the patient and family. After consideration of risks, benefits and other options for treatment, the patient has consented to  Procedure(s): UPPER ENDOSCOPIC ULTRASOUND (EUS) LINEAR (N/A) ENDOSCOPIC RETROGRADE CHOLANGIOPANCREATOGRAPHY (ERCP) WITH PROPOFOL (N/A) as a surgical intervention .  The patient's history has been reviewed, patient examined, no change in status, stable for surgery.  I have reviewed the patient's chart and labs.  Questions were answered to the patient's satisfaction.     Milus Banister
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
## 2014-07-28 NOTE — Telephone Encounter (Signed)
See alternate note  

## 2014-07-28 NOTE — Discharge Instructions (Signed)

## 2014-07-28 NOTE — Anesthesia Postprocedure Evaluation (Signed)
  Anesthesia Post-op Note  Patient: [name]  Procedure(s) Performed: Procedure(s) (LRB): UPPER ENDOSCOPIC ULTRASOUND (EUS) LINEAR (N/A) ENDOSCOPIC RETROGRADE CHOLANGIOPANCREATOGRAPHY (ERCP) WITH PROPOFOL (N/A)  Patient Location: PACU  Anesthesia Type: MAC  Level of Consciousness: awake and alert   Airway and Oxygen Therapy: Patient Spontanous Breathing  Post-op Pain: mild  Post-op Assessment: Post-op Vital signs reviewed, Patient's Cardiovascular Status Stable, Respiratory Function Stable, Patent Airway and No signs of Nausea or vomiting  Last Vitals:  Filed Vitals:   07/28/14 0939  BP: 110/58  Pulse: 62  Temp: 37 C  Resp: 26    Post-op Vital Signs: stable   Complications: No apparent anesthesia complications

## 2014-07-28 NOTE — Telephone Encounter (Signed)
[name], She needs referral to Dr. Barry Dienes and Dr. [name] for newly diagnosed, potentially resectable pancreatic head cancer. Can you also have her added to upcoming GI cancer conference. Thanks   Aris Everts.   ENDOSCOPIC IMPRESSION: 2.6cm by 2.3cm mass in head of pancreas that causes biliary obstruction (previously stented), main pancreatic duct dilation but does not directly abut any signficant nearby blood vessels. Preliminary cytology review is positive for malignancy (adenocarcinoma, EUS stage IB, T2N0M0). Await final pathology report. I will communicate these findings with Dr. Olevia Perches and will begin referrals to medical and surgical oncology.  The pt has been added to the next GI conference, referrals have been made to [facility].  Dr [name] the pt wants to know if she is to restart her xarelto today?

## 2014-07-28 NOTE — Op Note (Signed)
[facility] [place], [address]   ENDOSCOPIC ULTRASOUND PROCEDURE REPORT  PATIENT: [name], [name]  MR#: [id number] BIRTHDATE: March 28, 1946  GENDER: female ENDOSCOPIST: Milus Banister, MD PROCEDURE DATE:  07/28/2014 PROCEDURE:   Upper EUS w/FNA ASA CLASS:      Class III INDICATIONS:   CBD stricture, relatively painless jaundice, weight loss 30-40 pounds (semiintensionally) ; ERCP Dr.  [name] September, 2015 found distal bile duct stricture, biliary brushing was attempted but there was technical difficulty with the brushes, eventual covered stent was placed.  She had post ERCP pancreatitis. Followup MRI about a week after her discharge from the pancreatitis showed some fluid collections in the pancreas. Imaging prior to her ERCP did not show any clear masses, tumors of the pancreas.  Her bilirubin elevated as high as 4.2. CA 19-9 237 up to 342 two weeks ago. LFTS normalized after stent placement. MEDICATIONS: Monitored anesthesia care  DESCRIPTION OF PROCEDURE:   After the risks benefits and alternatives of the procedure were  explained, informed consent was obtained. The patient was then placed in the left, lateral, decubitus postion and IV sedation was administered. Throughout the procedure, the patients blood pressure, pulse and oxygen saturations were monitored continuously.  Under direct visualization, the Pentax Radial EUS [id number]  endoscope was introduced through the mouth  and advanced to the second portion of the duodenum .  Water was used as necessary to provide an acoustic interface.  Upon completion of the imaging, water was removed and the patient was sent to the recovery room in satisfactory condition.   Endoscopic findings: 1. Normal UGI tract except for previously place covered biliary stent extending into duodenum EUS findings: 1. Hypoechoic, irregularly bordered 2.6cm by 2.3cm mass in the head of pancreas, directly abutting  the previously placed biliary stent. The mass does not abut portal vein, SMV, SMA or celiac trunk. The mass was sampled with 3 transduodenal passes with a 25 guage EUS FNA needle, suction. 2. No peripancreatic adenopathy. 3. The main pancreatic duct is slightly dilated (54mm in body and tail). 4. Gallbladder with stones present. 5. Limited views of liver, spleen, portal and splenic vessels were all normal ENDOSCOPIC IMPRESSION: 2.6cm by 2.3cm mass in head of pancreas that causes biliary obstruction (previously stented), main pancreatic duct dilation but does not directly abut any signficant nearby blood vessels. Preliminary cytology review is positive for malignancy (adenocarcinoma, EUS stage IB, T2N0M0).  Await final pathology report.  I will communicate these findings with Dr. Olevia Perches and will begin referrals to medical and surgical oncology.  _______________________________ eSignedMilus Banister, MD 07/28/2014 9:45 AM

## 2014-07-28 NOTE — Transfer of Care (Signed)
Immediate Anesthesia Transfer of Care Note  Patient: [name]  Procedure(s) Performed: Procedure(s): UPPER ENDOSCOPIC ULTRASOUND (EUS) LINEAR (N/A) ENDOSCOPIC RETROGRADE CHOLANGIOPANCREATOGRAPHY (ERCP) WITH PROPOFOL (N/A)  Patient Location: PACU  Anesthesia Type:MAC  Level of Consciousness: awake, sedated and patient cooperative  Airway & Oxygen Therapy: Patient Spontanous Breathing and Patient connected to face mask oxygen  Post-op Assessment: Report given to PACU RN and Post -op Vital signs reviewed and stable  Post vital signs: Reviewed and stable  Complications: No apparent anesthesia complications

## 2014-07-28 NOTE — Telephone Encounter (Signed)
Yes, she can restart it

## 2014-07-28 NOTE — Telephone Encounter (Signed)
Pt aware and will call if she does not hear from the surgeon or oncology

## 2014-07-29 ENCOUNTER — Other Ambulatory Visit: Payer: Self-pay | Admitting: *Deleted

## 2014-07-29 ENCOUNTER — Encounter (HOSPITAL_COMMUNITY): Payer: Self-pay | Admitting: Gastroenterology

## 2014-08-01 ENCOUNTER — Ambulatory Visit (INDEPENDENT_AMBULATORY_CARE_PROVIDER_SITE_OTHER): Payer: Medicare Other | Admitting: Cardiology

## 2014-08-01 ENCOUNTER — Encounter: Payer: Self-pay | Admitting: *Deleted

## 2014-08-01 ENCOUNTER — Telehealth: Payer: Self-pay | Admitting: Oncology

## 2014-08-01 ENCOUNTER — Encounter: Payer: Self-pay | Admitting: Cardiology

## 2014-08-01 VITALS — BP 108/84 | HR 51 | Ht 62.0 in | Wt 185.0 lb

## 2014-08-01 DIAGNOSIS — I480 Paroxysmal atrial fibrillation: Secondary | ICD-10-CM

## 2014-08-01 DIAGNOSIS — I10 Essential (primary) hypertension: Secondary | ICD-10-CM

## 2014-08-01 NOTE — Patient Instructions (Signed)
Your physician has requested that you have an echocardiogram. Echocardiography is a painless test that uses sound waves to create images of your heart. It provides your doctor with information about the size and shape of your heart and how well your heart's chambers and valves are working. This procedure takes approximately one hour. There are no restrictions for this procedure.  Your physician wants you to follow-up in: 6 months with Dr Aundra Dubin. (May 2016).You will receive a reminder letter in the mail two months in advance. If you don't receive a letter, please call our office to schedule the follow-up appointment.   If you have surgery please let Dr Aundra Dubin know when it is scheduled. [phone].

## 2014-08-01 NOTE — Telephone Encounter (Signed)
S/W PATIENT AND GAVE NP APPT FOR 11/17 @ 1:30 W/DR. SHERRILL REFERRING DR.

## 2014-08-01 NOTE — Telephone Encounter (Signed)
REFERRING DR. [name] DX- MALIGNANT NEOPLASM OF PANCREAS WELCOME PACKET MAILED.

## 2014-08-02 ENCOUNTER — Other Ambulatory Visit (INDEPENDENT_AMBULATORY_CARE_PROVIDER_SITE_OTHER): Payer: Self-pay

## 2014-08-02 ENCOUNTER — Other Ambulatory Visit (INDEPENDENT_AMBULATORY_CARE_PROVIDER_SITE_OTHER): Payer: Self-pay | Admitting: General Surgery

## 2014-08-02 ENCOUNTER — Ambulatory Visit (HOSPITAL_COMMUNITY): Payer: Medicare Other | Attending: Cardiology | Admitting: Cardiology

## 2014-08-02 DIAGNOSIS — I480 Paroxysmal atrial fibrillation: Secondary | ICD-10-CM

## 2014-08-02 DIAGNOSIS — I10 Essential (primary) hypertension: Secondary | ICD-10-CM | POA: Diagnosis not present

## 2014-08-02 DIAGNOSIS — C259 Malignant neoplasm of pancreas, unspecified: Secondary | ICD-10-CM

## 2014-08-02 NOTE — Addendum Note (Signed)
Addended by: Katrine Coho on: 08/02/2014 11:45 AM   Modules accepted: Orders

## 2014-08-02 NOTE — Progress Notes (Signed)
Echo performed. 

## 2014-08-02 NOTE — Progress Notes (Signed)
Patient ID: [name], female   DOB: 10/13/1945, 68 y.o.   MRN: [id number] PCP: Dr. Modena Morrow  68 yo presents for followup of symptomatic paroxysmal atrial fibrillation.  She had breakthrough atrial fibrillation episodes on dronedarone. When she is in atrial fibrillation, she feels "bad" overall.  She is short of breath after walking 1/4 mile when in atrial fibrillation (normally no significant dyspnea).  I stopped dronedarone and had her start flecainide instead.  After increasing flecainide to 100 mg bid, she only mild occasional palpitations.  No exertional dyspnea or chest pain. She is active, mowing grass and doing yardwork.   Since last appointment, patient had ERCP showing a bile duct stricture.  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  This diagnosis was just made; she will be seeing oncology and general surgery. Weight is down 20 lbs since last appointment.   ECG: NSR, inferior T wave flattening.   Labs (9/14): K 3.9, creatinine 0.7 Labs (5/15): HCT 36.6 Labs (9/15): HCT 28.5 Labs (10/15): K 4.1, creatinine 0.7  PMH: 1. Atrial fibrillation: Paroxysmal, first noted in 1/13.  Echo (2/13) with EF 65%, mild MR.  Offered atrial fibrillation ablation by Dr. Rayann Heman but decided to continue antiarrhythmic management.   2. Osteoarthritis: Shoulder replacement in 5/09. TKR in 2014.  3. H/o traumatic c-spine fracture.  4. Endometrial cancer in 3/12.  Hysterectomy.  5. Stress echo in 9/09 was normal, Lexiscan myoview in 2/13 showed no ischemia or infarction.  6. HTN: ACEI cough.  7. Pancreatic adenocarcinoma: Diagnosis in 11/15 by FNA pancreatic head.  He had a bile duct stricture initially diagnosed.   SH: Lives in [place], married, does not work, no smoking.    FH: CAD, CHF in father; CVA in mother.   ROS: All systems reviewed and negative except as per HPI.   Current Outpatient Prescriptions  Medication Sig Dispense Refill  . acetaminophen (TYLENOL) 500 MG  tablet Take 1,000 mg by mouth every 6 (six) hours as needed for pain.     . Eszopiclone 3 MG TABS Take 1.5 mg by mouth at bedtime.     . flecainide (TAMBOCOR) 100 MG tablet Take 100 mg by mouth 2 (two) times daily.    Marland Kitchen losartan (COZAAR) 50 MG tablet Take 50 mg by mouth every morning.    . metoprolol succinate (TOPROL-XL) 25 MG 24 hr tablet Take 25 mg by mouth every morning.    . Multiple Vitamins-Minerals (CENTRUM SILVER PO) Take 1 tablet by mouth every morning.     Marland Kitchen omeprazole (PRILOSEC) 20 MG capsule Take 20 mg by mouth every morning.    . ondansetron (ZOFRAN) 4 MG tablet Take 1 tablet (4 mg total) by mouth 2 (two) times daily. 60 tablet 3  . psyllium (METAMUCIL SMOOTH TEXTURE) 28 % packet Take 1 packet by mouth daily as needed (constipation.).     Marland Kitchen rivaroxaban (XARELTO) 20 MG TABS tablet Take 20 mg by mouth at bedtime.    . Simethicone (GAS-X PO) Take 1 tablet by mouth 3 (three) times daily.    . vitamin C (ASCORBIC ACID) 500 MG tablet Take 500 mg by mouth every morning.      No current facility-administered medications for this visit.    BP 108/84 mmHg  Pulse 51  Ht 5\' 2"  (1.575 m)  Wt 185 lb (83.915 kg)  BMI 33.83 kg/m2 General: NAD, obese Neck: No JVD, no thyromegaly or thyroid nodule.  Lungs: Clear to auscultation bilaterally with normal respiratory effort. CV:  Nondisplaced PMI.  Heart regular S1/S2, no S3/S4, 1/6 SEM RUSB.  No edema.  No carotid bruit.  Normal pedal pulses.  Abdomen: Soft, nontender, no hepatosplenomegaly, no distention.  Neurologic: Alert and oriented x 3.  Psych: Normal affect. Extremities: No clubbing or cyanosis.   Assessment/Plan: 1. Atrial fibrillation: Paroxysmal.  No significant breakthrough symptoms on flecainide 100 mg bid.  She is in NSR today.  She was offered atrial fibrillation ablation in the past but decided to continue medical management with flecainide. - Continue flecainide 100 mg bid  (she has normal EF and had a negative stress test in  2/13) and Toprol XL.  - Continue Xarelto: hemoglobin was low in 9/15 but she denies BRBPR or melena.  ?Anemia of chronic disease with pancreatic cancer.  2. HTN:  BP is controlled on current regimen. Weight loss is helping.  3. Pre-operative evaluation: Patient may need Whipple procedure.  She has good exercise tolerance and would not need stress test prior to surgery.  I will arrange for echo to assess LV systolic function given PAF and no evaluation of EF for several years.    [name] 08/02/2014

## 2014-08-02 NOTE — H&P (Signed)
[name] 08/02/2014 8:45 AM Location: [facility] Patient #: [id number] DOB: 07/01/1946 Married / Language: English / Race: White Female  History of Present Illness (Betsaida [place] MD; 08/03/2014 2:14 AM) Patient words: New adenocarcinoma.  The patient is a 68 year old female who presents with pancreatic cancer. Pt is a 68 yo F who presents with a new dx of adenocarcinoma of the pancreatic head. She started having constant abdominal pain in May 2016 (6 months ago). She thought it might be diverticulitis, but her PCP felt like the tenderness was mostly in the epigastric region and RUQ. She also describes it radiating to her back. She went to see GI (Dr. Brodie) who worked her up for gallbladder disease. She did have stones. She saw Dr. Gross who reviewed her data and he felt like her symptoms were not biliary in nature based on the lack of symptoms with CCK infusion and constant nature of pain without change with food. He ordered a CT scan which did not show a mass.  She wrote it off until she started developing horrible itching in late september. The PA at her PCP office (Dr. Spears) got bloodwork and she was found to be jaundiced. She was referred back to GI and underwent ERCP (Dr. Jacobs) with stenting. Unfortunately, she did get post-ERCP pancreatitis. She got an MR and a follow up CT demonstrating some small pancreatic fluid collections, but no mass. Based on lack of helpful information for cause of stricture, Dr. Jacobs performed EUS, and he saw 2.6x2.3 cm mass in head of the pancreas without involvement of vascular structures. Cytology was positive for adenocarcinoma. She presents to discuss surgical therapy. Of note, she has a history of endometrial cancer and is s/p robotic vaginal hysterectomy wtih BSO. Her daughter has had breast cancer, and her sister has had breast and colon cancer. Her daughter had BRCA testing which was negative 2 years ago. I do not know if  she had a full genetic panel, or just BRCA.    Other Problems (Jason McDowell, LPN; 08/02/2014 8:45 AM) Arthritis Atrial Fibrillation Cholelithiasis Gastroesophageal Reflux Disease High blood pressure Oophorectomy Bilateral. Ovarian Cancer Pancreatic Cancer Pancreatitis  Past Surgical History (Jason McDowell, LPN; 08/02/2014 8:45 AM) Foot Surgery Bilateral. Hysterectomy (due to cancer) - Complete Knee Surgery Bilateral. Shoulder Surgery Bilateral.  Diagnostic Studies History (Jason McDowell, LPN; 08/02/2014 8:45 AM) Colonoscopy 1-5 years ago Mammogram within last year Pap Smear 1-5 years ago  Allergies (Jason McDowell, LPN; 08/02/2014 8:47 AM) Sulfa Antibiotics Codeine and Related Morphine Derivatives  Medication History (Jason McDowell, LPN; 08/02/2014 8:47 AM) Eszopiclone (3MG Tablet, Oral) Active. Dicyclomine HCl (10MG Capsule, Oral) Active. Flecainide Acetate (100MG Tablet, Oral) Active. Losartan Potassium (50MG Tablet, Oral) Active. Metoprolol Succinate ER (25MG Tablet ER 24HR, Oral) Active. Omeprazole (20MG Capsule DR, Oral) Active. Xarelto (20MG Tablet, Oral) Active. Ondansetron HCl (4MG Tablet, Oral) Active.  Social History (Jason McDowell, LPN; 08/02/2014 8:45 AM) Caffeine use Carbonated beverages, Coffee, Tea. No alcohol use No drug use Tobacco use Never smoker.  Family History (Jason McDowell, LPN; 08/02/2014 8:45 AM) Arthritis Brother, Daughter, Father, Mother, Sister. Breast Cancer Daughter, Sister. Cerebrovascular Accident Mother. Colon Cancer Sister. Colon Polyps Father, Sister. Diabetes Mellitus Mother, Sister. Heart Disease Father, Mother, Sister. Heart disease in female family member before age 65 Heart disease in female family member before age 55 Hypertension Brother, Daughter, Father, Mother, Sister. Kidney Disease Sister. Melanoma Sister. Respiratory Condition Father.  Pregnancy / Birth  History (Jason McDowell, LPN; 08/02/2014 8:45 AM)   Age at menarche 12 years. Age of menopause <45 Gravida 2 Irregular periods Maternal age 15-20 Para 2  Review of Systems (Jason McDowell LPN; 08/02/2014 8:45 AM) General Present- Appetite Loss and Weight Loss. Not Present- Chills, Fatigue, Fever, Night Sweats and Weight Gain. HEENT Present- Wears glasses/contact lenses. Not Present- Earache, Hearing Loss, Hoarseness, Nose Bleed, Oral Ulcers, Ringing in the Ears, Seasonal Allergies, Sinus Pain, Sore Throat, Visual Disturbances and Yellow Eyes. Gastrointestinal Present- Nausea. Not Present- Abdominal Pain, Bloating, Bloody Stool, Change in Bowel Habits, Chronic diarrhea, Constipation, Difficulty Swallowing, Excessive gas, Gets full quickly at meals, Hemorrhoids, Indigestion, Rectal Pain and Vomiting. Musculoskeletal Present- Joint Pain. Not Present- Back Pain, Joint Stiffness, Muscle Pain, Muscle Weakness and Swelling of Extremities. Hematology Present- Easy Bruising. Not Present- Excessive bleeding, Gland problems, HIV and Persistent Infections.   Vitals (Jason McDowell LPN; 08/02/2014 8:48 AM) 08/02/2014 8:47 AM Weight: 185.13 lb Height: 63in Body Surface Area: 1.93 m Body Mass Index: 32.79 kg/m Temp.: 98.5F(Temporal)  Pulse: 62 (Regular)  Resp.: 18 (Unlabored)  BP: 120/78 (Sitting, Right Wrist, Standard)    Physical Exam (Kawan Valladolid MD; 08/03/2014 2:14 AM) General Mental Status-Alert. General Appearance-Consistent with stated age. Hydration-Well hydrated. Voice-Normal.  Head and Neck Head-normocephalic, atraumatic with no lesions or palpable masses. Trachea-midline. Thyroid Gland Characteristics - normal size and consistency.  Eye Eyeball - Bilateral-Extraocular movements intact. Sclera/Conjunctiva - Bilateral-No scleral icterus.  Chest and Lung Exam Chest and lung exam reveals -quiet, even and easy respiratory effort with no use  of accessory muscles and on auscultation, normal breath sounds, no adventitious sounds and normal vocal resonance. Inspection Chest Wall - Normal. Back - normal.  Cardiovascular Cardiovascular examination reveals -normal heart sounds, regular rate and rhythm with no murmurs and normal pedal pulses bilaterally.  Abdomen Inspection Inspection of the abdomen reveals - No Hernias. Palpation/Percussion Palpation and Percussion of the abdomen reveal - Soft, No Rebound tenderness, No Rigidity (guarding) and No hepatosplenomegaly. Tenderness - Epigastrium and Right Upper Quadrant. Auscultation Auscultation of the abdomen reveals - Bowel sounds normal.  Neurologic Neurologic evaluation reveals -alert and oriented x 3 with no impairment of recent or remote memory. Mental Status-Normal.  Musculoskeletal Global Assessment -Note: no gross deformities.  Normal Exam - Left-Upper Extremity Strength Normal and Lower Extremity Strength Normal. Normal Exam - Right-Upper Extremity Strength Normal and Lower Extremity Strength Normal.  Lymphatic Head & Neck  General Head & Neck Lymphatics: Bilateral - Description - Normal. Axillary  General Axillary Region: Bilateral - Description - Normal. Tenderness - Non Tender. Femoral & Inguinal  Generalized Femoral & Inguinal Lymphatics: Bilateral - Description - No Generalized lymphadenopathy.    Assessment & Plan (Lennon Boutwell MD; 08/03/2014 2:23 AM) ADENOCARCINOMA OF HEAD OF PANCREAS (157.0  C25.0) Impression: Patient appears to have a T2N0 pancreatic head cancer. She is a candidate for up front surgery. I will order a chest CT for staging. She will need a repeat CA 19-9 now that her pancreatitis has resolved. I will get one pre op. I have advised her to keep appt wtih Dr. Sherrill as she is very likely to need post op chemo and/or radiation. It is helpful for her to hear this information before surgery.  I discussed the surgery with the  patient including diagrams of anatomy. I discussed the potential for diagnostic laparoscopy. In the case of pancreatic cancer, if spread of the disease is found, we will abort the procedure and not proceed with resection. The rationale for this was discussed with the patient. There has   not been data to support resection of Stage IV disease in terms of survival benefit.  We discussed possible complications including: Potential of aborting procedure if tumor is invading the superior mesenteric or hepatic arteries Bleeding Infection and possible wound complications such as hernia Damage to adjacent structures Leak of anastamoses, primarily pancreatic Possible need for other procedures Possible prolonged nausea with possible need for external feeding. Possible prolonged hospital stay. Possible development of diabetes or worsening of current diabetes. Possible pancreatic exocrine insufficiency Prolonged fatigue/weakness/appetite Possible early recurrence of cancer   The patient understands and wishes to proceed.  She had an appointment with Dr. McLean of cardiology regarding her atrial fibrillation and xarelto. He got an echo today which showed good LV function wtih minimal diastolic dysfunction. Assuming he gives her a low to moderate risk for surgery, and assuming her chest CT is negative for metastatic disease, we will get her on the schedule for dx laparoscopy and whipple.   60 min spent in evaluation, examination, counseling, and coordination of care. >50% spent in counseling. Current Plans  CT CHEST W CON (71260) Schedule for Surgery Pt Education - flb whipple pt info Referred to Genetic Counseling, for evaluation and follow up (Medical Genetics). FAMILY HISTORY OF BREAST CANCER IN FIRST DEGREE RELATIVE (V16.3  Z80.3) FAMILY HISTORY OF MALIGNANT NEOPLASM OF COLON IN FIRST DEGREE RELATIVE DIAGNOSED WHEN YOUNGER THAN 67 YEARS OF AGE (V16.0  Z80.0) HISTORY OF ENDOMETRIAL CANCER (V10.42   Z85.42)    Signed by Keiva Dina, MD (08/03/2014 2:24 AM) 

## 2014-08-03 ENCOUNTER — Other Ambulatory Visit (INDEPENDENT_AMBULATORY_CARE_PROVIDER_SITE_OTHER): Payer: Self-pay | Admitting: General Surgery

## 2014-08-03 ENCOUNTER — Telehealth: Payer: Self-pay | Admitting: Genetic Counselor

## 2014-08-03 NOTE — Telephone Encounter (Signed)
S/W PATIENT AND GAVE GENETIC APPT FOR 11/16 @ 10 W/KAREN POWELL REFERRING DR. BYERLY

## 2014-08-04 ENCOUNTER — Ambulatory Visit
Admission: RE | Admit: 2014-08-04 | Discharge: 2014-08-04 | Disposition: A | Discharge status: Home / Self Care | Payer: Medicare Other | Source: Ambulatory Visit | Attending: General Surgery | Admitting: General Surgery

## 2014-08-04 DIAGNOSIS — C259 Malignant neoplasm of pancreas, unspecified: Secondary | ICD-10-CM

## 2014-08-04 MED ORDER — IOHEXOL 300 MG/ML  SOLN
75.0000 mL | Freq: Once | INTRAMUSCULAR | Status: AC | PRN
  Administered 2014-08-04: 75 mL via INTRAVENOUS

## 2014-08-08 ENCOUNTER — Ambulatory Visit (HOSPITAL_BASED_OUTPATIENT_CLINIC_OR_DEPARTMENT_OTHER): Payer: Medicare Other | Admitting: Genetic Counselor

## 2014-08-08 ENCOUNTER — Encounter: Payer: Self-pay | Admitting: Genetic Counselor

## 2014-08-08 ENCOUNTER — Other Ambulatory Visit: Payer: Medicare Other

## 2014-08-08 DIAGNOSIS — C55 Malignant neoplasm of uterus, part unspecified: Secondary | ICD-10-CM

## 2014-08-08 DIAGNOSIS — Z803 Family history of malignant neoplasm of breast: Secondary | ICD-10-CM

## 2014-08-08 DIAGNOSIS — C50919 Malignant neoplasm of unspecified site of unspecified female breast: Secondary | ICD-10-CM | POA: Insufficient documentation

## 2014-08-08 DIAGNOSIS — C259 Malignant neoplasm of pancreas, unspecified: Secondary | ICD-10-CM

## 2014-08-08 DIAGNOSIS — Z800 Family history of malignant neoplasm of digestive organs: Secondary | ICD-10-CM

## 2014-08-08 DIAGNOSIS — Z315 Encounter for genetic counseling: Secondary | ICD-10-CM
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
## 2014-08-09 ENCOUNTER — Other Ambulatory Visit: Payer: Self-pay | Admitting: *Deleted

## 2014-08-09 ENCOUNTER — Telehealth: Payer: Self-pay | Admitting: Oncology

## 2014-08-09 ENCOUNTER — Ambulatory Visit: Payer: Medicare Other

## 2014-08-09 ENCOUNTER — Encounter: Payer: Self-pay | Admitting: Oncology

## 2014-08-09 ENCOUNTER — Ambulatory Visit (HOSPITAL_BASED_OUTPATIENT_CLINIC_OR_DEPARTMENT_OTHER): Payer: Medicare Other | Admitting: Oncology

## 2014-08-09 VITALS — BP 117/71 | HR 50 | Temp 98.3°F | Resp 18 | Ht 64.0 in | Wt 186.3 lb

## 2014-08-09 DIAGNOSIS — C259 Malignant neoplasm of pancreas, unspecified: Secondary | ICD-10-CM | POA: Insufficient documentation

## 2014-08-09 DIAGNOSIS — Z8589 Personal history of malignant neoplasm of other organs and systems: Secondary | ICD-10-CM

## 2014-08-09 DIAGNOSIS — C250 Malignant neoplasm of head of pancreas: Secondary | ICD-10-CM

## 2014-08-09 MED ORDER — HYDROCODONE-ACETAMINOPHEN 5-325 MG PO TABS: 1.0000 | ORAL_TABLET | ORAL | Status: DC | PRN
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
## 2014-08-09 NOTE — Progress Notes (Signed)
Checked in new pt with no financial concerns at this time.  Pt has Raquel's card for any questions or concerns. ° °

## 2014-08-09 NOTE — Telephone Encounter (Signed)
Gave avs & cal for Jan 2016. °

## 2014-08-12 ENCOUNTER — Telehealth: Payer: Self-pay | Admitting: Nutrition

## 2014-08-12 NOTE — Telephone Encounter (Signed)
Contacted patient to inform her of the Impact AR trial using nutrition supplements for patients undergoing a Whipple procedure.  Educated patient on trial.  Patient verbalizes interest.  Scheduled appointment for Tuesday Nov 24 to discuss further.

## 2014-08-16 ENCOUNTER — Ambulatory Visit: Payer: Medicare Other | Admitting: Nutrition

## 2014-08-16 NOTE — Progress Notes (Signed)
68 year old female diagnosed with cancer of the pancreas.  She is a patient of Dr. [name] of Dr. Barry Dienes.  Past medical history includes, arthritis, atrial fibrillation, obesity, hypertension, GERD, diverticulosis, cholelithiasis, DDD, ischemic colitis, and endometrial cancer.  Medications include multivitamin, Prilosec, Zofran, and Metamucil, Xarelto, Gas-X, and vitamin C  Labs include glucose of 130 on October 19.  Height: 64 inches. Weight: 186.3 pounds Aug 09, 2014. Usual body weight: 215 pounds March 2015. BMI: 31.96.  Patient is scheduled for a Whipple procedure on 08/30/2014.  Patient was referred to RD to participate in the Impact AR trial.  Contacted patient by phone, who agreed to come in and meet with me to discuss further.  Nutrition diagnosis:  Food and nutrition related knowledge deficit related to diagnosis of pancreas cancer and associated treatments as evidenced by no prior need for nutrition related information.  Intervention: Patient and husband were educated importance of good nutrition prior to surgery and directly after surgery. Educated patient on impact  AR trial, and explained patient would be asked to consume impact  AR before and after Whipple procedure. Patient agreed to trial and consent form was signed. Patient was provided with complementary case of impact AR. Questions were answered and teach back method used.  Monitoring, evaluation, goals: Patient will tolerate impact AR prior to surgery and after surgery for improved outcomes.  Next visit: To be scheduled  **Disclaimer: This note was dictated with voice recognition software. Similar sounding words can inadvertently be transcribed and this note may contain transcription errors which may not have been corrected upon publication of note.**

## 2014-08-22 NOTE — Pre-Procedure Instructions (Signed)
[name]  08/22/2014   Your procedure is scheduled on:  Tuesday, December 8th  Report to [facility] Admitting at 530 AM.  Call this number if you have problems the morning of surgery: [phone]   Remember:   Do not eat food or drink liquids after midnight.   Take these medicines the morning of surgery with A SIP OF WATER: flecainide, toprol, prilosec, flonase, hydrocodone if needed, zofran  xarelto per md instructions   Do not wear jewelry, make-up or nail polish.  Do not wear lotions, powders, or perfumes,deodorant.  Do not shave 48 hours prior to surgery. Men may shave face and neck.  Do not bring valuables to the hospital.  [facility] is not responsible   for any belongings or valuables.               Contacts, dentures or bridgework may not be worn into surgery.  Leave suitcase in the car. After surgery it may be brought to your room.  For patients admitted to the hospital, discharge time is determined by your  treatment team.             Please read over the following fact sheets that you were given: Pain Booklet, Coughing and Deep Breathing, Blood Transfusion Information and Surgical Site Infection Prevention  [facility] - Preparing for Surgery  Before surgery, you can play an important role.  Because skin is not sterile, your skin needs to be as free of germs as possible.  You can reduce the number of germs on you skin by washing with CHG (chlorahexidine gluconate) soap before surgery.  CHG is an antiseptic cleaner which kills germs and bonds with the skin to continue killing germs even after washing.  Please DO NOT use if you have an allergy to CHG or antibacterial soaps.  If your skin becomes reddened/irritated stop using the CHG and inform your nurse when you arrive at Short Stay.  Do not shave (including legs and underarms) for at least 48 hours prior to the first CHG shower.  You may shave your face.  Please follow these instructions  carefully:   1.  Shower with CHG Soap the night before surgery and the morning of Surgery.  2.  If you choose to wash your hair, wash your hair first as usual with your normal shampoo.  3.  After you shampoo, rinse your hair and body thoroughly to remove the shampoo.  4.  Use CHG as you would any other liquid soap.  You can apply CHG directly to the skin and wash gently with scrungie or a clean washcloth.  5.  Apply the CHG Soap to your body ONLY FROM THE NECK DOWN.  Do not use on open wounds or open sores.  Avoid contact with your eyes, ears, mouth and genitals (private parts).  Wash genitals (private parts) with your normal soap.  6.  Wash thoroughly, paying special attention to the area where your surgery will be performed.  7.  Thoroughly rinse your body with warm water from the neck down.  8.  DO NOT shower/wash with your normal soap after using and rinsing off the CHG Soap.  9.  Pat yourself dry with a clean towel.            10.  Wear clean pajamas.            11.  Place clean sheets on your bed the night of your first shower and do not  sleep with pets.  Day of Surgery  Do not apply any lotions/deoderants the morning of surgery.  Please wear clean clothes to the hospital/surgery center.

## 2014-08-23 ENCOUNTER — Encounter (HOSPITAL_COMMUNITY): Payer: Self-pay

## 2014-08-23 ENCOUNTER — Encounter (HOSPITAL_COMMUNITY)
Admission: RE | Admit: 2014-08-23 | Discharge: 2014-08-23 | Disposition: A | Discharge status: Home / Self Care | Payer: Medicare Other | Source: Ambulatory Visit | Attending: General Surgery | Admitting: General Surgery

## 2014-08-23 DIAGNOSIS — Z96653 Presence of artificial knee joint, bilateral: Secondary | ICD-10-CM | POA: Insufficient documentation

## 2014-08-23 DIAGNOSIS — I4891 Unspecified atrial fibrillation: Secondary | ICD-10-CM | POA: Diagnosis not present

## 2014-08-23 DIAGNOSIS — I10 Essential (primary) hypertension: Secondary | ICD-10-CM | POA: Insufficient documentation

## 2014-08-23 DIAGNOSIS — C541 Malignant neoplasm of endometrium: Secondary | ICD-10-CM | POA: Insufficient documentation

## 2014-08-23 DIAGNOSIS — Z01818 Encounter for other preprocedural examination: Secondary | ICD-10-CM | POA: Diagnosis present

## 2014-08-23 DIAGNOSIS — Z90710 Acquired absence of both cervix and uterus: Secondary | ICD-10-CM | POA: Insufficient documentation

## 2014-08-23 DIAGNOSIS — I351 Nonrheumatic aortic (valve) insufficiency: Secondary | ICD-10-CM | POA: Diagnosis not present

## 2014-08-23 DIAGNOSIS — C250 Malignant neoplasm of head of pancreas: Secondary | ICD-10-CM | POA: Diagnosis not present

## 2014-08-23 DIAGNOSIS — K219 Gastro-esophageal reflux disease without esophagitis: Secondary | ICD-10-CM | POA: Diagnosis not present

## 2014-08-23 DIAGNOSIS — I071 Rheumatic tricuspid insufficiency: Secondary | ICD-10-CM | POA: Diagnosis not present

## 2014-08-23 DIAGNOSIS — K559 Vascular disorder of intestine, unspecified: Secondary | ICD-10-CM | POA: Diagnosis not present

## 2014-08-23 DIAGNOSIS — K5790 Diverticulosis of intestine, part unspecified, without perforation or abscess without bleeding: Secondary | ICD-10-CM | POA: Insufficient documentation

## 2014-08-23 DIAGNOSIS — Z981 Arthrodesis status: Secondary | ICD-10-CM | POA: Diagnosis not present

## 2014-08-23 HISTORY — PX: CHOLECYSTECTOMY OPEN: SUR202

## 2014-08-23 LAB — COMPREHENSIVE METABOLIC PANEL
ALBUMIN: 3.6 g/dL (ref 3.5–5.2)
ALK PHOS: 95 U/L (ref 39–117)
ALT: 24 U/L (ref 0–35)
AST: 25 U/L (ref 0–37)
Anion gap: 15 (ref 5–15)
BILIRUBIN TOTAL: 0.4 mg/dL (ref 0.3–1.2)
BUN: 7 mg/dL (ref 6–23)
CHLORIDE: 101 meq/L (ref 96–112)
CO2: 20 meq/L (ref 19–32)
CREATININE: 0.57 mg/dL (ref 0.50–1.10)
Calcium: 9.2 mg/dL (ref 8.4–10.5)
GFR calc Af Amer: >90 mL/min (ref >90)
Glucose, Bld: 198 mg/dL — ABNORMAL HIGH (ref 70–99)
POTASSIUM: 3.8 meq/L (ref 3.7–5.3)
Sodium: 136 mEq/L — ABNORMAL LOW (ref 137–147)
Total Protein: 7 g/dL (ref 6.0–8.3)

## 2014-08-23 LAB — CBC WITH DIFFERENTIAL/PLATELET
BASOS ABS: 0 10*3/uL (ref 0.0–0.1)
BASOS PCT: 0 % (ref 0–1)
Eosinophils Absolute: 0.2 10*3/uL (ref 0.0–0.7)
Eosinophils Relative: 4 % (ref 0–5)
HEMATOCRIT: 33.6 % — AB (ref 36.0–46.0)
HEMOGLOBIN: 11.2 g/dL — AB (ref 12.0–15.0)
LYMPHS PCT: 30 % (ref 12–46)
Lymphs Abs: 1.5 10*3/uL (ref 0.7–4.0)
MCH: 28.4 pg (ref 26.0–34.0)
MCHC: 33.3 g/dL (ref 30.0–36.0)
MCV: 85.1 fL (ref 78.0–100.0)
MONO ABS: 0.3 10*3/uL (ref 0.1–1.0)
MONOS PCT: 6 % (ref 3–12)
NEUTROS ABS: 2.9 10*3/uL (ref 1.7–7.7)
NEUTROS PCT: 60 % (ref 43–77)
Platelets: 231 10*3/uL (ref 150–400)
RBC: 3.95 MIL/uL (ref 3.87–5.11)
RDW: 13.3 % (ref 11.5–15.5)
WBC: 4.9 10*3/uL (ref 4.0–10.5)

## 2014-08-23 LAB — URINALYSIS, ROUTINE W REFLEX MICROSCOPIC
BILIRUBIN URINE: NEGATIVE
GLUCOSE, UA: NEGATIVE mg/dL
HGB URINE DIPSTICK: NEGATIVE
Ketones, ur: NEGATIVE mg/dL
Nitrite: NEGATIVE
PROTEIN: NEGATIVE mg/dL
Specific Gravity, Urine: 1.007 (ref 1.005–1.030)
Urobilinogen, UA: 0.2 mg/dL (ref 0.0–1.0)
pH: 5.5 (ref 5.0–8.0)

## 2014-08-23 LAB — URINE MICROSCOPIC-ADD ON

## 2014-08-23 LAB — HEMOGLOBIN A1C
Hgb A1c MFr Bld: 6.6 % — ABNORMAL HIGH (ref <5.7)
Mean Plasma Glucose: 143 mg/dL — ABNORMAL HIGH (ref <117)

## 2014-08-23 LAB — PREPARE RBC (CROSSMATCH)

## 2014-08-23 LAB — PROTIME-INR
INR: 1.42 (ref 0.00–1.49)
PROTHROMBIN TIME: 17.5 s — AB (ref 11.6–15.2)

## 2014-08-23 LAB — APTT: APTT: 39 s — AB (ref 24–37)

## 2014-08-23 NOTE — Progress Notes (Signed)
Notified dr. Chuck Hint office regarding need for consent order.

## 2014-08-23 NOTE — Progress Notes (Signed)
Primary - dr. [place] Poot in [place] Cardiologist - dr. Oneta Rack, ekg in epic from this year  Per dr. Barry Dienes stop xaerlto 4 days prior to surgery

## 2014-08-23 NOTE — Telephone Encounter (Signed)
error 

## 2014-08-24 ENCOUNTER — Telehealth: Payer: Self-pay | Admitting: Cardiology

## 2014-08-24 NOTE — Telephone Encounter (Signed)
New message    patient calling having surgery on  12/8 @ 7:30 at [facility].

## 2014-08-24 NOTE — Telephone Encounter (Signed)
Pt states she is having surgery at [facility] 08/30/14 by Dr Barry Dienes.  She is calling to let Dr Aundra Dubin know the surgery date.

## 2014-08-24 NOTE — Telephone Encounter (Signed)
Thank you will be available to see her in the hospital.
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
## 2014-08-25 NOTE — Progress Notes (Signed)
Called Dr Marlowe Aschoff office and spoke to triage nurse who will let Dr Barry Dienes know that we still need orders for surgical concent

## 2014-08-26 ENCOUNTER — Telehealth: Payer: Self-pay | Admitting: Nutrition

## 2014-08-26 NOTE — Telephone Encounter (Signed)
Patient reports she had stomach discomfort and felt constipated yesterday after drinking Impact AR BID.   Reports her bowels moved and she felt better. Today she felt the same way, but improved after bowels moved. Educated patient to increase water intake since this formula has 3.6 grams fiber/carton. Patient verbalized understanding and wanted to continue with formula.

## 2014-08-29 MED ORDER — CEFOXITIN SODIUM 2 G IV SOLR
2.0000 g | INTRAVENOUS | Status: AC
  Administered 2014-08-30: 2 g via INTRAVENOUS
  Filled 2014-08-29: qty 2

## 2014-08-30 ENCOUNTER — Inpatient Hospital Stay (HOSPITAL_COMMUNITY)
Admission: RE | Admit: 2014-08-30 | Discharge: 2014-09-10 | DRG: 406 | Disposition: A | Discharge status: Home / Self Care | Payer: Medicare Other | Source: Ambulatory Visit | Attending: General Surgery | Admitting: General Surgery

## 2014-08-30 ENCOUNTER — Encounter (HOSPITAL_COMMUNITY): Payer: Self-pay | Admitting: *Deleted

## 2014-08-30 ENCOUNTER — Inpatient Hospital Stay (HOSPITAL_COMMUNITY): Payer: Medicare Other | Admitting: Anesthesiology

## 2014-08-30 ENCOUNTER — Encounter (HOSPITAL_COMMUNITY)
Admission: RE | Disposition: A | Discharge status: Home / Self Care | Payer: Self-pay | Source: Ambulatory Visit | Attending: General Surgery

## 2014-08-30 ENCOUNTER — Inpatient Hospital Stay (HOSPITAL_COMMUNITY): Payer: Medicare Other | Admitting: Vascular Surgery

## 2014-08-30 DIAGNOSIS — Z96612 Presence of left artificial shoulder joint: Secondary | ICD-10-CM | POA: Diagnosis present

## 2014-08-30 DIAGNOSIS — Z8543 Personal history of malignant neoplasm of ovary: Secondary | ICD-10-CM | POA: Diagnosis not present

## 2014-08-30 DIAGNOSIS — I10 Essential (primary) hypertension: Secondary | ICD-10-CM | POA: Diagnosis present

## 2014-08-30 DIAGNOSIS — D638 Anemia in other chronic diseases classified elsewhere: Secondary | ICD-10-CM | POA: Diagnosis present

## 2014-08-30 DIAGNOSIS — Z7901 Long term (current) use of anticoagulants: Secondary | ICD-10-CM | POA: Diagnosis not present

## 2014-08-30 DIAGNOSIS — I480 Paroxysmal atrial fibrillation: Secondary | ICD-10-CM | POA: Diagnosis present

## 2014-08-30 DIAGNOSIS — D62 Acute posthemorrhagic anemia: Secondary | ICD-10-CM | POA: Diagnosis not present

## 2014-08-30 DIAGNOSIS — I4892 Unspecified atrial flutter: Secondary | ICD-10-CM | POA: Diagnosis not present

## 2014-08-30 DIAGNOSIS — Z8542 Personal history of malignant neoplasm of other parts of uterus: Secondary | ICD-10-CM | POA: Diagnosis not present

## 2014-08-30 DIAGNOSIS — E871 Hypo-osmolality and hyponatremia: Secondary | ICD-10-CM | POA: Diagnosis present

## 2014-08-30 DIAGNOSIS — R739 Hyperglycemia, unspecified: Secondary | ICD-10-CM | POA: Diagnosis present

## 2014-08-30 DIAGNOSIS — I252 Old myocardial infarction: Secondary | ICD-10-CM

## 2014-08-30 DIAGNOSIS — Z79891 Long term (current) use of opiate analgesic: Secondary | ICD-10-CM | POA: Diagnosis not present

## 2014-08-30 DIAGNOSIS — C250 Malignant neoplasm of head of pancreas: Principal | ICD-10-CM | POA: Diagnosis present

## 2014-08-30 DIAGNOSIS — Z803 Family history of malignant neoplasm of breast: Secondary | ICD-10-CM | POA: Diagnosis not present

## 2014-08-30 DIAGNOSIS — Z79899 Other long term (current) drug therapy: Secondary | ICD-10-CM

## 2014-08-30 DIAGNOSIS — R52 Pain, unspecified: Secondary | ICD-10-CM

## 2014-08-30 DIAGNOSIS — Z90710 Acquired absence of both cervix and uterus: Secondary | ICD-10-CM

## 2014-08-30 DIAGNOSIS — K219 Gastro-esophageal reflux disease without esophagitis: Secondary | ICD-10-CM | POA: Diagnosis present

## 2014-08-30 DIAGNOSIS — I4891 Unspecified atrial fibrillation: Secondary | ICD-10-CM | POA: Diagnosis not present

## 2014-08-30 DIAGNOSIS — C259 Malignant neoplasm of pancreas, unspecified: Secondary | ICD-10-CM | POA: Diagnosis present

## 2014-08-30 DIAGNOSIS — G4733 Obstructive sleep apnea (adult) (pediatric): Secondary | ICD-10-CM | POA: Diagnosis present

## 2014-08-30 HISTORY — PX: LAPAROSCOPY: SHX197

## 2014-08-30 HISTORY — PX: WHIPPLE PROCEDURE: SHX2667

## 2014-08-30 LAB — GLUCOSE, CAPILLARY
GLUCOSE-CAPILLARY: 264 mg/dL — AB (ref 70–99)
Glucose-Capillary: 235 mg/dL — ABNORMAL HIGH (ref 70–99)
Glucose-Capillary: 275 mg/dL — ABNORMAL HIGH (ref 70–99)

## 2014-08-30 LAB — POCT I-STAT 7, (LYTES, BLD GAS, ICA,H+H)
BICARBONATE: 23.8 meq/L (ref 20.0–24.0)
Calcium, Ion: 1.16 mmol/L (ref 1.13–1.30)
HCT: 29 % — ABNORMAL LOW (ref 36.0–46.0)
Hemoglobin: 9.9 g/dL — ABNORMAL LOW (ref 12.0–15.0)
O2 Saturation: 100 %
POTASSIUM: 3.9 meq/L (ref 3.7–5.3)
Sodium: 135 mEq/L — ABNORMAL LOW (ref 137–147)
TCO2: 25 mmol/L (ref 0–100)
pCO2 arterial: 33.6 mmHg — ABNORMAL LOW (ref 35.0–45.0)
pH, Arterial: 7.451 — ABNORMAL HIGH (ref 7.350–7.450)
pO2, Arterial: 176 mmHg — ABNORMAL HIGH (ref 80.0–100.0)

## 2014-08-30 LAB — CBC
HEMATOCRIT: 34 % — AB (ref 36.0–46.0)
HEMOGLOBIN: 11.8 g/dL — AB (ref 12.0–15.0)
MCH: 29.2 pg (ref 26.0–34.0)
MCHC: 34.7 g/dL (ref 30.0–36.0)
MCV: 84.2 fL (ref 78.0–100.0)
Platelets: 285 10*3/uL (ref 150–400)
RBC: 4.04 MIL/uL (ref 3.87–5.11)
RDW: 13.2 % (ref 11.5–15.5)
WBC: 20.8 10*3/uL — AB (ref 4.0–10.5)

## 2014-08-30 LAB — PROTIME-INR
INR: 1.04 (ref 0.00–1.49)
PROTHROMBIN TIME: 13.8 s (ref 11.6–15.2)

## 2014-08-30 LAB — APTT: APTT: 30 s (ref 24–37)

## 2014-08-30 LAB — MRSA PCR SCREENING: MRSA by PCR: NEGATIVE

## 2014-08-30 SURGERY — WHIPPLE PROCEDURE
Anesthesia: General

## 2014-08-30 MED ORDER — ONDANSETRON HCL 4 MG/2ML IJ SOLN
INTRAMUSCULAR | Status: DC | PRN
  Administered 2014-08-30: 4 mg via INTRAVENOUS

## 2014-08-30 MED ORDER — HYDROMORPHONE 0.3 MG/ML IV SOLN
INTRAVENOUS | Status: AC
  Filled 2014-08-30: qty 25

## 2014-08-30 MED ORDER — FENTANYL CITRATE 0.05 MG/ML IJ SOLN
INTRAMUSCULAR | Status: AC
  Filled 2014-08-30: qty 5

## 2014-08-30 MED ORDER — 0.9 % SODIUM CHLORIDE (POUR BTL) OPTIME
TOPICAL | Status: DC | PRN
  Administered 2014-08-30 (×2): 1000 mL

## 2014-08-30 MED ORDER — BUPIVACAINE ON-Q PAIN PUMP (FOR ORDER SET NO CHG)
INJECTION | Status: DC
  Filled 2014-08-30: qty 1

## 2014-08-30 MED ORDER — NALOXONE HCL 0.4 MG/ML IJ SOLN: 0.4000 mg | INTRAMUSCULAR | Status: DC | PRN

## 2014-08-30 MED ORDER — BUPIVACAINE 0.25 % ON-Q PUMP DUAL CATH 300 ML
INJECTION | Status: DC | PRN
  Administered 2014-08-30: 300 mL

## 2014-08-30 MED ORDER — PHENYLEPHRINE 40 MCG/ML (10ML) SYRINGE FOR IV PUSH (FOR BLOOD PRESSURE SUPPORT)
PREFILLED_SYRINGE | INTRAVENOUS | Status: AC
  Filled 2014-08-30: qty 10

## 2014-08-30 MED ORDER — ACETAMINOPHEN 160 MG/5ML PO SOLN
325.0000 mg | ORAL | Status: DC | PRN
  Filled 2014-08-30: qty 20.3

## 2014-08-30 MED ORDER — OXYCODONE HCL 5 MG/5ML PO SOLN: 5.0000 mg | Freq: Once | ORAL | Status: DC | PRN

## 2014-08-30 MED ORDER — ONDANSETRON HCL 4 MG/2ML IJ SOLN
4.0000 mg | Freq: Four times a day (QID) | INTRAMUSCULAR | Status: DC | PRN
  Administered 2014-09-02 – 2014-09-08 (×4): 4 mg via INTRAVENOUS
  Filled 2014-08-30 (×5): qty 2

## 2014-08-30 MED ORDER — HYDROMORPHONE 0.3 MG/ML IV SOLN
INTRAVENOUS | Status: DC
  Administered 2014-08-30: 1.2 mg via INTRAVENOUS
  Administered 2014-08-30: 13:00:00 via INTRAVENOUS
  Administered 2014-08-30: 1.5 mL via INTRAVENOUS
  Administered 2014-08-31: 0.9 mg via INTRAVENOUS
  Administered 2014-08-31: 07:00:00 via INTRAVENOUS
  Administered 2014-08-31 (×2): 1.2 mg via INTRAVENOUS
  Administered 2014-08-31: 1.8 mg via INTRAVENOUS
  Administered 2014-08-31: 1.97 mg via INTRAVENOUS
  Administered 2014-09-01: 2 mg via INTRAVENOUS
  Administered 2014-09-01: 0.9 mg via INTRAVENOUS
  Administered 2014-09-01: 2.8 mg via INTRAVENOUS
  Administered 2014-09-01: 01:00:00 via INTRAVENOUS
  Administered 2014-09-01 (×2): 2.4 mg via INTRAVENOUS
  Administered 2014-09-01: 2.1 mg via INTRAVENOUS
  Administered 2014-09-02: 0.3 mg via INTRAVENOUS
  Administered 2014-09-02: 1.2 mg via INTRAVENOUS
  Administered 2014-09-02 (×2): 2.4 mg via INTRAVENOUS
  Administered 2014-09-02: 04:00:00 via INTRAVENOUS
  Administered 2014-09-02: 3 mg via INTRAVENOUS
  Filled 2014-08-30 (×4): qty 25

## 2014-08-30 MED ORDER — ACETAMINOPHEN 325 MG PO TABS: 325.0000 mg | ORAL_TABLET | ORAL | Status: DC | PRN

## 2014-08-30 MED ORDER — STERILE WATER FOR IRRIGATION IR SOLN
Status: DC | PRN
  Administered 2014-08-30 (×2): 1000 mL

## 2014-08-30 MED ORDER — PROPOFOL 10 MG/ML IV BOLUS
INTRAVENOUS | Status: AC
  Filled 2014-08-30: qty 20

## 2014-08-30 MED ORDER — CEFAZOLIN SODIUM-DEXTROSE 2-3 GM-% IV SOLR
2.0000 g | Freq: Three times a day (TID) | INTRAVENOUS | Status: DC
  Filled 2014-08-30: qty 50

## 2014-08-30 MED ORDER — FENTANYL CITRATE 0.05 MG/ML IJ SOLN
INTRAMUSCULAR | Status: DC | PRN
  Administered 2014-08-30: 50 ug via INTRAVENOUS
  Administered 2014-08-30: 150 ug via INTRAVENOUS
  Administered 2014-08-30: 50 ug via INTRAVENOUS
  Administered 2014-08-30: 100 ug via INTRAVENOUS
  Administered 2014-08-30 (×2): 50 ug via INTRAVENOUS
  Administered 2014-08-30 (×2): 100 ug via INTRAVENOUS
  Administered 2014-08-30: 50 ug via INTRAVENOUS

## 2014-08-30 MED ORDER — NEOSTIGMINE METHYLSULFATE 10 MG/10ML IV SOLN
INTRAVENOUS | Status: AC
  Filled 2014-08-30: qty 1

## 2014-08-30 MED ORDER — CETYLPYRIDINIUM CHLORIDE 0.05 % MT LIQD
7.0000 mL | Freq: Two times a day (BID) | OROMUCOSAL | Status: DC
  Administered 2014-08-31 – 2014-09-08 (×16): 7 mL via OROMUCOSAL

## 2014-08-30 MED ORDER — PHENYLEPHRINE HCL 10 MG/ML IJ SOLN
INTRAMUSCULAR | Status: DC | PRN
  Administered 2014-08-30: 60 ug via INTRAVENOUS
  Administered 2014-08-30 (×4): 40 ug via INTRAVENOUS
  Administered 2014-08-30: 80 ug via INTRAVENOUS
  Administered 2014-08-30: 40 ug via INTRAVENOUS

## 2014-08-30 MED ORDER — ONDANSETRON HCL 4 MG PO TABS: 4.0000 mg | ORAL_TABLET | Freq: Four times a day (QID) | ORAL | Status: DC | PRN

## 2014-08-30 MED ORDER — GLYCOPYRROLATE 0.2 MG/ML IJ SOLN
INTRAMUSCULAR | Status: DC | PRN
  Administered 2014-08-30: 0.2 mg via INTRAVENOUS
  Administered 2014-08-30: 0.6 mg via INTRAVENOUS

## 2014-08-30 MED ORDER — HYDRALAZINE HCL 20 MG/ML IJ SOLN: 10.0000 mg | INTRAMUSCULAR | Status: DC | PRN

## 2014-08-30 MED ORDER — PROCAINAMIDE HCL 100 MG/ML IJ SOLN
2.0000 mg/min | INTRAVENOUS | Status: DC
  Administered 2014-08-30 – 2014-09-01 (×5): 2 mg/min via INTRAVENOUS
  Filled 2014-08-30 (×5): qty 10

## 2014-08-30 MED ORDER — LACTATED RINGERS IV SOLN
INTRAVENOUS | Status: DC | PRN
  Administered 2014-08-30 (×2): via INTRAVENOUS

## 2014-08-30 MED ORDER — NEOSTIGMINE METHYLSULFATE 10 MG/10ML IV SOLN
INTRAVENOUS | Status: DC | PRN
  Administered 2014-08-30: 4 mg via INTRAVENOUS

## 2014-08-30 MED ORDER — OXYCODONE HCL 5 MG PO TABS: 5.0000 mg | ORAL_TABLET | Freq: Once | ORAL | Status: DC | PRN

## 2014-08-30 MED ORDER — LIDOCAINE HCL 1 % IJ SOLN
INTRAMUSCULAR | Status: DC | PRN
  Administered 2014-08-30: 9 mL via SUBCUTANEOUS

## 2014-08-30 MED ORDER — ROCURONIUM BROMIDE 100 MG/10ML IV SOLN
INTRAVENOUS | Status: DC | PRN
  Administered 2014-08-30: 10 mg via INTRAVENOUS
  Administered 2014-08-30: 40 mg via INTRAVENOUS

## 2014-08-30 MED ORDER — HEPARIN (PORCINE) IN NACL 100-0.45 UNIT/ML-% IJ SOLN
1100.0000 [IU]/h | INTRAMUSCULAR | Status: DC
  Filled 2014-08-30: qty 250

## 2014-08-30 MED ORDER — EVICEL 5 ML EX KIT
PACK | CUTANEOUS | Status: AC
  Filled 2014-08-30: qty 1

## 2014-08-30 MED ORDER — KCL IN DEXTROSE-NACL 20-5-0.45 MEQ/L-%-% IV SOLN
INTRAVENOUS | Status: DC
  Administered 2014-08-30: 100 mL via INTRAVENOUS
  Administered 2014-08-31: 20:00:00 via INTRAVENOUS
  Administered 2014-08-31: 100 mL/h via INTRAVENOUS
  Administered 2014-09-01: 06:00:00 via INTRAVENOUS
  Filled 2014-08-30 (×6): qty 1000

## 2014-08-30 MED ORDER — EPHEDRINE SULFATE 50 MG/ML IJ SOLN
INTRAMUSCULAR | Status: AC
  Filled 2014-08-30: qty 1

## 2014-08-30 MED ORDER — METOPROLOL TARTRATE 1 MG/ML IV SOLN
5.0000 mg | Freq: Four times a day (QID) | INTRAVENOUS | Status: DC
Start: 2014-08-30 — End: 2014-08-30
  Administered 2014-08-30: 5 mg via INTRAVENOUS
  Filled 2014-08-30: qty 5

## 2014-08-30 MED ORDER — EVICEL 5 ML EX KIT
PACK | CUTANEOUS | Status: DC | PRN
  Administered 2014-08-30: 5 mL

## 2014-08-30 MED ORDER — HYDROMORPHONE HCL 1 MG/ML IJ SOLN: 0.5000 mg | INTRAMUSCULAR | Status: DC | PRN

## 2014-08-30 MED ORDER — GLYCOPYRROLATE 0.2 MG/ML IJ SOLN
INTRAMUSCULAR | Status: AC
  Filled 2014-08-30: qty 3

## 2014-08-30 MED ORDER — METOPROLOL TARTRATE 1 MG/ML IV SOLN: 5.0000 mg | Freq: Four times a day (QID) | INTRAVENOUS | Status: DC | PRN

## 2014-08-30 MED ORDER — PROPOFOL 10 MG/ML IV BOLUS
INTRAVENOUS | Status: DC | PRN
  Administered 2014-08-30: 140 mg via INTRAVENOUS
  Administered 2014-08-30: 20 mg via INTRAVENOUS

## 2014-08-30 MED ORDER — SODIUM CHLORIDE 0.9 % IJ SOLN
INTRAMUSCULAR | Status: AC
  Filled 2014-08-30: qty 10

## 2014-08-30 MED ORDER — DIPHENHYDRAMINE HCL 50 MG/ML IJ SOLN
12.5000 mg | Freq: Four times a day (QID) | INTRAMUSCULAR | Status: DC | PRN
  Administered 2014-09-02 – 2014-09-03 (×2): 12.5 mg via INTRAVENOUS
  Filled 2014-08-30 (×2): qty 1

## 2014-08-30 MED ORDER — LIDOCAINE HCL (PF) 1 % IJ SOLN
INTRAMUSCULAR | Status: AC
  Filled 2014-08-30: qty 30

## 2014-08-30 MED ORDER — DIPHENHYDRAMINE HCL 12.5 MG/5ML PO ELIX
12.5000 mg | ORAL_SOLUTION | Freq: Four times a day (QID) | ORAL | Status: DC | PRN
  Filled 2014-08-30: qty 5

## 2014-08-30 MED ORDER — METOPROLOL TARTRATE 1 MG/ML IV SOLN: 10.0000 mg | Freq: Four times a day (QID) | INTRAVENOUS | Status: DC

## 2014-08-30 MED ORDER — DEXAMETHASONE SODIUM PHOSPHATE 4 MG/ML IJ SOLN
INTRAMUSCULAR | Status: DC | PRN
  Administered 2014-08-30: 4 mg via INTRAVENOUS

## 2014-08-30 MED ORDER — HEPARIN (PORCINE) IN NACL 100-0.45 UNIT/ML-% IJ SOLN
1500.0000 [IU]/h | INTRAMUSCULAR | Status: DC
  Administered 2014-08-31 (×2): 1100 [IU]/h via INTRAVENOUS
  Administered 2014-09-02: 1300 [IU]/h via INTRAVENOUS
  Administered 2014-09-02: 1200 [IU]/h via INTRAVENOUS
  Filled 2014-08-30 (×6): qty 250

## 2014-08-30 MED ORDER — CETYLPYRIDINIUM CHLORIDE 0.05 % MT LIQD: 7.0000 mL | Freq: Two times a day (BID) | OROMUCOSAL | Status: DC

## 2014-08-30 MED ORDER — ACETAMINOPHEN 10 MG/ML IV SOLN
INTRAVENOUS | Status: AC
  Filled 2014-08-30: qty 100

## 2014-08-30 MED ORDER — DEXTROSE 5 % IV SOLN
1.0000 g | Freq: Four times a day (QID) | INTRAVENOUS | Status: AC
  Administered 2014-08-30 – 2014-08-31 (×3): 1 g via INTRAVENOUS
  Filled 2014-08-30 (×3): qty 1

## 2014-08-30 MED ORDER — ACETAMINOPHEN 10 MG/ML IV SOLN
INTRAVENOUS | Status: DC | PRN
  Administered 2014-08-30: 1000 mg via INTRAVENOUS

## 2014-08-30 MED ORDER — BUPIVACAINE-EPINEPHRINE (PF) 0.25% -1:200000 IJ SOLN
INTRAMUSCULAR | Status: AC
  Filled 2014-08-30: qty 30

## 2014-08-30 MED ORDER — VECURONIUM BROMIDE 10 MG IV SOLR
INTRAVENOUS | Status: DC | PRN
  Administered 2014-08-30 (×2): 2 mg via INTRAVENOUS

## 2014-08-30 MED ORDER — LIDOCAINE HCL (CARDIAC) 20 MG/ML IV SOLN
INTRAVENOUS | Status: DC | PRN
  Administered 2014-08-30: 6 mg via INTRAVENOUS

## 2014-08-30 MED ORDER — ONDANSETRON HCL 4 MG/2ML IJ SOLN
INTRAMUSCULAR | Status: AC
  Filled 2014-08-30: qty 2

## 2014-08-30 MED ORDER — PANTOPRAZOLE SODIUM 40 MG IV SOLR
40.0000 mg | Freq: Every day | INTRAVENOUS | Status: DC
  Administered 2014-08-30 – 2014-08-31 (×2): 40 mg via INTRAVENOUS
  Filled 2014-08-30 (×4): qty 40

## 2014-08-30 MED ORDER — MIDAZOLAM HCL 5 MG/5ML IJ SOLN
INTRAMUSCULAR | Status: DC | PRN
  Administered 2014-08-30: 2 mg via INTRAVENOUS

## 2014-08-30 MED ORDER — HYDROMORPHONE HCL 1 MG/ML IJ SOLN
INTRAMUSCULAR | Status: AC
  Filled 2014-08-30: qty 1

## 2014-08-30 MED ORDER — BUPIVACAINE 0.25 % ON-Q PUMP DUAL CATH 300 ML
300.0000 mL | INJECTION | Status: DC
  Filled 2014-08-30: qty 300

## 2014-08-30 MED ORDER — LACTATED RINGERS IV SOLN
INTRAVENOUS | Status: DC | PRN
  Administered 2014-08-30: 08:00:00 via INTRAVENOUS

## 2014-08-30 MED ORDER — LIDOCAINE HCL (CARDIAC) 20 MG/ML IV SOLN
INTRAVENOUS | Status: AC
  Filled 2014-08-30: qty 5

## 2014-08-30 MED ORDER — ONDANSETRON HCL 4 MG/2ML IJ SOLN
4.0000 mg | Freq: Four times a day (QID) | INTRAMUSCULAR | Status: DC | PRN
  Administered 2014-09-02 (×2): 4 mg via INTRAVENOUS
  Filled 2014-08-30 (×4): qty 2

## 2014-08-30 MED ORDER — INSULIN ASPART 100 UNIT/ML ~~LOC~~ SOLN
0.0000 [IU] | SUBCUTANEOUS | Status: DC
  Administered 2014-08-30: 5 [IU] via SUBCUTANEOUS
  Administered 2014-08-30: 3 [IU] via SUBCUTANEOUS
  Administered 2014-08-31: 5 [IU] via SUBCUTANEOUS
  Administered 2014-08-31: 3 [IU] via SUBCUTANEOUS

## 2014-08-30 MED ORDER — ROCURONIUM BROMIDE 50 MG/5ML IV SOLN
INTRAVENOUS | Status: AC
  Filled 2014-08-30: qty 1

## 2014-08-30 MED ORDER — SODIUM CHLORIDE 0.9 % IJ SOLN: 9.0000 mL | INTRAMUSCULAR | Status: DC | PRN

## 2014-08-30 MED ORDER — HYDROMORPHONE HCL 1 MG/ML IJ SOLN
0.2500 mg | INTRAMUSCULAR | Status: DC | PRN
  Administered 2014-08-30 (×2): 0.5 mg via INTRAVENOUS

## 2014-08-30 MED ORDER — MIDAZOLAM HCL 2 MG/2ML IJ SOLN
INTRAMUSCULAR | Status: AC
  Filled 2014-08-30: qty 2

## 2014-08-30 MED ORDER — CHLORHEXIDINE GLUCONATE 0.12 % MT SOLN
15.0000 mL | Freq: Two times a day (BID) | OROMUCOSAL | Status: DC
  Administered 2014-08-30 – 2014-09-08 (×16): 15 mL via OROMUCOSAL
  Filled 2014-08-30 (×20): qty 15

## 2014-08-30 MED ORDER — FLUTICASONE PROPIONATE 50 MCG/ACT NA SUSP
1.0000 | Freq: Two times a day (BID) | NASAL | Status: DC
  Administered 2014-08-30 – 2014-09-09 (×14): 1 via NASAL
  Filled 2014-08-30: qty 16

## 2014-08-30 SURGICAL SUPPLY — 118 items
BLADE SURG ROTATE 9660 (MISCELLANEOUS) IMPLANT
BOOT SUTURE AID YELLOW STND (SUTURE) ×3 IMPLANT
BRR ADH 5X3 SEPRAFILM 6 SHT (MISCELLANEOUS)
CANISTER SUCT 3000ML (MISCELLANEOUS) ×2 IMPLANT
CANISTER SUCTION 2500CC (MISCELLANEOUS) ×3 IMPLANT
CATH KIT ON Q 7.5IN SLV (PAIN MANAGEMENT) ×6 IMPLANT
CATH ROBINSON RED A/P 16FR (CATHETERS) IMPLANT
CHLORAPREP W/TINT 26ML (MISCELLANEOUS) ×3 IMPLANT
CLIP LIGATING HEM O LOK PURPLE (MISCELLANEOUS) ×14 IMPLANT
CLIP LIGATING HEMO O LOK GREEN (MISCELLANEOUS) ×3 IMPLANT
CLIP LIGATING HEMOLOK MED (MISCELLANEOUS) ×3 IMPLANT
CLIP TI LARGE 6 (CLIP) ×3 IMPLANT
CLIP TI MEDIUM 24 (CLIP) ×3 IMPLANT
CONT SPEC 4OZ CLIKSEAL STRL BL (MISCELLANEOUS) ×5 IMPLANT
COVER SURGICAL LIGHT HANDLE (MISCELLANEOUS) ×3 IMPLANT
DECANTER SPIKE VIAL GLASS SM (MISCELLANEOUS) ×6 IMPLANT
DRAIN CHANNEL 19F RND (DRAIN) ×6 IMPLANT
DRAIN PENROSE 1/2X36 STERILE (WOUND CARE) ×3 IMPLANT
DRAPE LAPAROSCOPIC ABDOMINAL (DRAPES) ×3 IMPLANT
DRAPE UTILITY XL STRL (DRAPES) ×6 IMPLANT
DRAPE WARM FLUID 44X44 (DRAPE) ×3 IMPLANT
DRSG COVADERM 4X10 (GAUZE/BANDAGES/DRESSINGS) ×2 IMPLANT
DRSG COVADERM 4X14 (GAUZE/BANDAGES/DRESSINGS) IMPLANT
DRSG COVADERM 4X6 (GAUZE/BANDAGES/DRESSINGS) IMPLANT
DRSG COVADERM 4X8 (GAUZE/BANDAGES/DRESSINGS) IMPLANT
DRSG TELFA 3X8 NADH (GAUZE/BANDAGES/DRESSINGS) IMPLANT
ELECT BLADE 4.0 EZ CLEAN MEGAD (MISCELLANEOUS) ×3
ELECT BLADE 6.5 EXT (BLADE) ×3 IMPLANT
ELECT CAUTERY BLADE 6.4 (BLADE) ×3 IMPLANT
ELECT REM PT RETURN 9FT ADLT (ELECTROSURGICAL) ×3
ELECTRODE BLDE 4.0 EZ CLN MEGD (MISCELLANEOUS) ×1 IMPLANT
ELECTRODE REM PT RTRN 9FT ADLT (ELECTROSURGICAL) ×1 IMPLANT
EVACUATOR SILICONE 100CC (DRAIN) ×6 IMPLANT
GAUZE SPONGE 4X4 12PLY STRL (GAUZE/BANDAGES/DRESSINGS) IMPLANT
GAUZE SPONGE 4X4 16PLY XRAY LF (GAUZE/BANDAGES/DRESSINGS) IMPLANT
GLOVE BIO SURGEON STRL SZ 6 (GLOVE) ×10 IMPLANT
GLOVE BIO SURGEON STRL SZ 6.5 (GLOVE) ×1 IMPLANT
GLOVE BIO SURGEONS STRL SZ 6.5 (GLOVE) ×1
GLOVE BIOGEL PI IND STRL 6.5 (GLOVE) ×1 IMPLANT
GLOVE BIOGEL PI IND STRL 7.0 (GLOVE) IMPLANT
GLOVE BIOGEL PI IND STRL 8 (GLOVE) IMPLANT
GLOVE BIOGEL PI INDICATOR 6.5 (GLOVE) ×2
GLOVE BIOGEL PI INDICATOR 7.0 (GLOVE) ×10
GLOVE BIOGEL PI INDICATOR 8 (GLOVE) ×2
GLOVE ECLIPSE 7.0 STRL STRAW (GLOVE) ×4 IMPLANT
GLOVE EUDERMIC 7 POWDERFREE (GLOVE) ×4 IMPLANT
GOWN STRL REUS W/ TWL LRG LVL3 (GOWN DISPOSABLE) ×2 IMPLANT
GOWN STRL REUS W/ TWL XL LVL3 (GOWN DISPOSABLE) IMPLANT
GOWN STRL REUS W/TWL 2XL LVL3 (GOWN DISPOSABLE) ×6 IMPLANT
GOWN STRL REUS W/TWL LRG LVL3 (GOWN DISPOSABLE) ×9
GOWN STRL REUS W/TWL XL LVL3 (GOWN DISPOSABLE) ×3
HEMOSTAT SURGICEL 2X14 (HEMOSTASIS) IMPLANT
KIT BASIN OR (CUSTOM PROCEDURE TRAY) ×3 IMPLANT
KIT ROOM TURNOVER OR (KITS) ×3 IMPLANT
LIQUID BAND (GAUZE/BANDAGES/DRESSINGS) ×3 IMPLANT
LOOP VESSEL MAXI BLUE (MISCELLANEOUS) ×3 IMPLANT
NDL BIOPSY 14X6 SOFT TISS (NEEDLE) IMPLANT
NEEDLE BIOPSY 14X6 SOFT TISS (NEEDLE) IMPLANT
NS IRRIG 1000ML POUR BTL (IV SOLUTION) ×8 IMPLANT
PACK GENERAL/GYN (CUSTOM PROCEDURE TRAY) ×3 IMPLANT
PAD ARMBOARD 7.5X6 YLW CONV (MISCELLANEOUS) ×6 IMPLANT
PAD DRESSING TELFA 3X8 NADH (GAUZE/BANDAGES/DRESSINGS) IMPLANT
PAD SHARPS MAGNETIC DISPOSAL (MISCELLANEOUS) IMPLANT
PLUG CATH AND CAP STER (CATHETERS) IMPLANT
RELOAD PROXIMATE 75MM BLUE (ENDOMECHANICALS) ×9 IMPLANT
RELOAD STAPLE 75 3.8 BLU REG (ENDOMECHANICALS) ×2 IMPLANT
SCISSORS LAP 5X35 DISP (ENDOMECHANICALS) IMPLANT
SEPRAFILM PROCEDURAL PACK 3X5 (MISCELLANEOUS) IMPLANT
SET IRRIG TUBING LAPAROSCOPIC (IRRIGATION / IRRIGATOR) IMPLANT
SHEARS FOC LG CVD HARMONIC 17C (MISCELLANEOUS) ×3 IMPLANT
SLEEVE ENDOPATH XCEL 5M (ENDOMECHANICALS) ×3 IMPLANT
SPONGE GAUZE 4X4 12PLY STER LF (GAUZE/BANDAGES/DRESSINGS) ×4 IMPLANT
SPONGE INTESTINAL PEANUT (DISPOSABLE) IMPLANT
SPONGE LAP 18X18 X RAY DECT (DISPOSABLE) ×14 IMPLANT
SPONGE SURGIFOAM ABS GEL 100 (HEMOSTASIS) IMPLANT
STAPLER PROXIMATE 75MM BLUE (STAPLE) ×3 IMPLANT
STAPLER VISISTAT 35W (STAPLE) ×3 IMPLANT
SUCTION POOLE TIP (SUCTIONS) ×3 IMPLANT
SUT 5.0 PDS RB-1 (SUTURE) ×12
SUT ETHILON 2 0 FS 18 (SUTURE) ×6 IMPLANT
SUT ETHILON 2 LR (SUTURE) ×3 IMPLANT
SUT MNCRL AB 4-0 PS2 18 (SUTURE) ×3 IMPLANT
SUT PDS AB 1 TP1 96 (SUTURE) ×9 IMPLANT
SUT PDS AB 3-0 SH 27 (SUTURE) ×10 IMPLANT
SUT PDS AB 4-0 RB1 27 (SUTURE) ×26 IMPLANT
SUT PDS PLUS AB 5-0 RB-1 (SUTURE) IMPLANT
SUT PROLENE 3 0 SH 48 (SUTURE) ×12 IMPLANT
SUT PROLENE 4 0 RB 1 (SUTURE) ×6
SUT PROLENE 4 0 SH DA (SUTURE) ×6 IMPLANT
SUT PROLENE 4-0 RB1 .5 CRCL 36 (SUTURE) ×2 IMPLANT
SUT PROLENE 5 0 RB 1 DA (SUTURE) ×6 IMPLANT
SUT SILK 2 0 SH CR/8 (SUTURE) ×5 IMPLANT
SUT SILK 2 0 TIES 10X30 (SUTURE) ×3 IMPLANT
SUT SILK 3 0 SH CR/8 (SUTURE) ×3 IMPLANT
SUT SILK 3 0 TIES 10X30 (SUTURE) ×3 IMPLANT
SUT VIC AB 2-0 CT1 27 (SUTURE)
SUT VIC AB 2-0 CT1 TAPERPNT 27 (SUTURE) IMPLANT
SUT VIC AB 2-0 SH 18 (SUTURE) ×3 IMPLANT
SUT VIC AB 3-0 SH 18 (SUTURE) ×3 IMPLANT
SUT VIC AB 3-0 SH 27 (SUTURE) ×3
SUT VIC AB 3-0 SH 27X BRD (SUTURE) ×1 IMPLANT
SUT VIC AB 4-0 RB1 18 (SUTURE) ×3 IMPLANT
SUT VICRYL AB 2 0 TIES (SUTURE) IMPLANT
TAPE CLOTH SURG 4X10 WHT LF (GAUZE/BANDAGES/DRESSINGS) ×4 IMPLANT
TAPE UMBILICAL 1/8 X36 TWILL (MISCELLANEOUS) ×3 IMPLANT
TOWEL OR 17X24 6PK STRL BLUE (TOWEL DISPOSABLE) ×3 IMPLANT
TOWEL OR 17X26 10 PK STRL BLUE (TOWEL DISPOSABLE) ×3 IMPLANT
TRAY FOLEY CATH 14FRSI W/METER (CATHETERS) ×3 IMPLANT
TRAY LAPAROSCOPIC (CUSTOM PROCEDURE TRAY) ×3 IMPLANT
TROCAR XCEL BLUNT TIP 100MML (ENDOMECHANICALS) IMPLANT
TROCAR XCEL NON-BLD 11X100MML (ENDOMECHANICALS) ×3 IMPLANT
TROCAR XCEL NON-BLD 5MMX100MML (ENDOMECHANICALS) ×3 IMPLANT
TUBE FEEDING 5FR 15 INCH (TUBING) IMPLANT
TUBE FEEDING 8FR 16IN STR KANG (MISCELLANEOUS) IMPLANT
TUBING INSUFFLATION (TUBING) ×3 IMPLANT
TUNNELER SHEATH ON-Q 16GX12 DP (PAIN MANAGEMENT) ×3 IMPLANT
WATER STERILE IRR 1000ML POUR (IV SOLUTION) ×4 IMPLANT
YANKAUER SUCT BULB TIP NO VENT (SUCTIONS) ×3 IMPLANT

## 2014-08-30 NOTE — Anesthesia Procedure Notes (Addendum)
Procedure Name: Intubation Date/Time: 08/30/2014 8:09 AM Performed by: Shirlyn Goltz Pre-anesthesia Checklist: Patient identified, Emergency Drugs available, Suction available, Patient being monitored and Timeout performed Patient Re-evaluated:Patient Re-evaluated prior to inductionOxygen Delivery Method: Circle system utilized Preoxygenation: Pre-oxygenation with 100% oxygen Intubation Type: IV induction Ventilation: Mask ventilation without difficulty and Oral airway inserted - appropriate to patient size Laryngoscope Size: Sabra Heck and 2 Grade View: Grade I Tube type: Oral Tube size: 7.0 mm Number of attempts: 1 Airway Equipment and Method: Stylet Placement Confirmation: ETT inserted through vocal cords under direct vision,  positive ETCO2 and breath sounds checked- equal and bilateral Secured at: 21 cm Tube secured with: Tape Dental Injury: Teeth and Oropharynx as per pre-operative assessment

## 2014-08-30 NOTE — Interval H&P Note (Signed)
History and Physical Interval Note:  08/30/2014 7:47 AM  [name]  has presented today for surgery, with the diagnosis of ADENOCARCINOMA OF PANCREATIC HEAD  The various methods of treatment have been discussed with the patient and family. After consideration of risks, benefits and other options for treatment, the patient has consented to  Procedure(s): WHIPPLE PROCEDURE (N/A) LAPAROSCOPY DIAGNOSTIC (N/A) as a surgical intervention .  The patient's history has been reviewed, patient examined, no change in status, stable for surgery.  I have reviewed the patient's chart and labs.  Questions were answered to the patient's satisfaction.     [name]

## 2014-08-30 NOTE — Progress Notes (Signed)
MD called about heparin gtt to be started at 10pm, MD stated not to start it until tomorrow AM and for there to be no heparin bolus upon initiation of gtt.

## 2014-08-30 NOTE — H&P (View-Only) (Signed)
[name]. [name] 08/02/2014 8:45 AM Location: [facility] Patient #: [id number] DOB: Jun 02, 1946 Married / Language: [name] / Race: White Female  History of Present Illness [name]; 08/03/2014 2:14 AM) Patient words: New adenocarcinoma.  The patient is a 68 year old female who presents with pancreatic cancer. Pt is a 68 yo F who presents with a new dx of adenocarcinoma of the pancreatic head. She started having constant abdominal pain in May 2016 (6 months ago). She thought it might be diverticulitis, but her PCP felt like the tenderness was mostly in the epigastric region and RUQ. She also describes it radiating to her back. She went to see GI ([name]) who worked her up for gallbladder disease. She did have stones. She saw [name] who reviewed her data and he felt like her symptoms were not biliary in nature based on the lack of symptoms with CCK infusion and constant nature of pain without change with food. He ordered a CT scan which did not show a mass.  She wrote it off until she started developing horrible itching in late september. The PA at her PCP office ([name]) got bloodwork and she was found to be jaundiced. She was referred back to GI and underwent ERCP ([name]) with stenting. Unfortunately, she did get post-ERCP pancreatitis. She got an MR and a follow up CT demonstrating some small pancreatic fluid collections, but no mass. Based on lack of helpful information for cause of stricture, [name] performed EUS, and he saw 2.6x2.3 cm mass in head of the pancreas without involvement of vascular structures. Cytology was positive for adenocarcinoma. She presents to discuss surgical therapy. Of note, she has a history of endometrial cancer and is s/p robotic vaginal hysterectomy wtih BSO. Her daughter has had breast cancer, and her sister has had breast and colon cancer. Her daughter had BRCA testing which was negative 2 years ago. I do not know if  she had a full genetic panel, or just BRCA.    Other Problems Savannah Holts, [name]; 29/79/8921 1:94 AM) Arthritis Atrial Fibrillation Cholelithiasis Gastroesophageal Reflux Disease High blood pressure Oophorectomy Bilateral. Ovarian Cancer Pancreatic Cancer Pancreatitis  Past Surgical History Savannah Holts, [name]; 17/40/8144 8:18 AM) Foot Surgery Bilateral. Hysterectomy (due to cancer) - Complete Knee Surgery Bilateral. Shoulder Surgery Bilateral.  Diagnostic Studies History Savannah Holts, [name]; 56/31/4970 2:63 AM) Colonoscopy 1-5 years ago Mammogram within last year Pap Smear 1-5 years ago  Allergies Savannah Holts, [name]; 78/58/8502 7:74 AM) Sulfa Antibiotics Codeine and Related Morphine Derivatives  Medication History Savannah Holts, [name]; 12/87/8676 7:20 AM) Eszopiclone (3MG Tablet, Oral) Active. Dicyclomine HCl (10MG Capsule, Oral) Active. Flecainide Acetate (100MG Tablet, Oral) Active. Losartan Potassium (50MG Tablet, Oral) Active. Metoprolol Succinate ER (25MG Tablet ER 24HR, Oral) Active. Omeprazole (20MG Capsule DR, Oral) Active. Xarelto (20MG Tablet, Oral) Active. Ondansetron HCl (4MG Tablet, Oral) Active.  Social History Savannah Holts, [name]; 94/70/9628 3:66 AM) Caffeine use Carbonated beverages, Coffee, Tea. No alcohol use No drug use Tobacco use Never smoker.  Family History Savannah Holts, [name]; 29/47/6546 5:03 AM) Arthritis Brother, Daughter, Father, Mother, Sister. Breast Cancer Daughter, Sister. Cerebrovascular Accident Mother. Colon Cancer Sister. Colon Polyps Father, Sister. Diabetes Mellitus Mother, Sister. Heart Disease Father, Mother, Sister. Heart disease in female family member before age 23 Heart disease in female family member before age 4 Hypertension Brother, Daughter, Father, Mother, Sister. Kidney Disease Sister. Melanoma Sister. Respiratory Condition Father.  Pregnancy / Birth  History Savannah Holts, [name]; 54/65/6812 7:51 AM)  Age at menarche 85 years. Age of menopause <46 Gravida 2 Irregular periods Maternal age 32-20 Para 2  Review of Systems Savannah Holts [name]; 55/73/2202 5:42 AM) General Present- Appetite Loss and Weight Loss. Not Present- Chills, Fatigue, Fever, Night Sweats and Weight Gain. HEENT Present- Wears glasses/contact lenses. Not Present- Earache, Hearing Loss, Hoarseness, Nose Bleed, Oral Ulcers, Ringing in the Ears, Seasonal Allergies, Sinus Pain, Sore Throat, Visual Disturbances and Yellow Eyes. Gastrointestinal Present- Nausea. Not Present- Abdominal Pain, Bloating, Bloody Stool, Change in Bowel Habits, Chronic diarrhea, Constipation, Difficulty Swallowing, Excessive gas, Gets full quickly at meals, Hemorrhoids, Indigestion, Rectal Pain and Vomiting. Musculoskeletal Present- Joint Pain. Not Present- Back Pain, Joint Stiffness, Muscle Pain, Muscle Weakness and Swelling of Extremities. Hematology Present- Easy Bruising. Not Present- Excessive bleeding, Gland problems, HIV and Persistent Infections.   Vitals Savannah Holts [name]; 70/62/3762 8:31 AM) 08/02/2014 8:47 AM Weight: 185.13 lb Height: 63in Body Surface Area: 1.93 m Body Mass Index: 32.79 kg/m Temp.: 98.35F(Temporal)  Pulse: 62 (Regular)  Resp.: 18 (Unlabored)  BP: 120/78 (Sitting, Right Wrist, Standard)    Physical Exam [name]; 08/03/2014 2:14 AM) General Mental Status-Alert. General Appearance-Consistent with stated age. Hydration-Well hydrated. Voice-Normal.  Head and Neck Head-normocephalic, atraumatic with no lesions or palpable masses. Trachea-midline. Thyroid Gland Characteristics - normal size and consistency.  Eye Eyeball - Bilateral-Extraocular movements intact. Sclera/Conjunctiva - Bilateral-No scleral icterus.  Chest and Lung Exam Chest and lung exam reveals -quiet, even and easy respiratory effort with no use  of accessory muscles and on auscultation, normal breath sounds, no adventitious sounds and normal vocal resonance. Inspection Chest Wall - Normal. Back - normal.  Cardiovascular Cardiovascular examination reveals -normal heart sounds, regular rate and rhythm with no murmurs and normal pedal pulses bilaterally.  Abdomen Inspection Inspection of the abdomen reveals - No Hernias. Palpation/Percussion Palpation and Percussion of the abdomen reveal - Soft, No Rebound tenderness, No Rigidity (guarding) and No hepatosplenomegaly. Tenderness - Epigastrium and Right Upper Quadrant. Auscultation Auscultation of the abdomen reveals - Bowel sounds normal.  Neurologic Neurologic evaluation reveals -alert and oriented x 3 with no impairment of recent or remote memory. Mental Status-Normal.  Musculoskeletal Global Assessment -Note: no gross deformities.  Normal Exam - Left-Upper Extremity Strength Normal and Lower Extremity Strength Normal. Normal Exam - Right-Upper Extremity Strength Normal and Lower Extremity Strength Normal.  Lymphatic Head & Neck  General Head & Neck Lymphatics: Bilateral - Description - Normal. Axillary  General Axillary Region: Bilateral - Description - Normal. Tenderness - Non Tender. Femoral & Inguinal  Generalized Femoral & Inguinal Lymphatics: Bilateral - Description - No Generalized lymphadenopathy.    Assessment & Plan [name]; 08/03/2014 2:23 AM) ADENOCARCINOMA OF HEAD OF PANCREAS (157.0  C25.0) Impression: Patient appears to have a T2N0 pancreatic head cancer. She is a candidate for up front surgery. I will order a chest CT for staging. She will need a repeat CA 19-9 now that her pancreatitis has resolved. I will get one pre op. I have advised her to keep appt wtih Dr. [name] as she is very likely to need post op chemo and/or radiation. It is helpful for her to hear this information before surgery.  I discussed the surgery with the  patient including diagrams of anatomy. I discussed the potential for diagnostic laparoscopy. In the case of pancreatic cancer, if spread of the disease is found, we will abort the procedure and not proceed with resection. The rationale for this was discussed with the patient. There has  not been data to support resection of Stage IV disease in terms of survival benefit.  We discussed possible complications including: Potential of aborting procedure if tumor is invading the superior mesenteric or hepatic arteries Bleeding Infection and possible wound complications such as hernia Damage to adjacent structures Leak of anastamoses, primarily pancreatic Possible need for other procedures Possible prolonged nausea with possible need for external feeding. Possible prolonged hospital stay. Possible development of diabetes or worsening of current diabetes. Possible pancreatic exocrine insufficiency Prolonged fatigue/weakness/appetite Possible early recurrence of cancer   The patient understands and wishes to proceed.  She had an appointment with Dr. Aundra Dubin of cardiology regarding her atrial fibrillation and xarelto. He got an echo today which showed good LV function wtih minimal diastolic dysfunction. Assuming he gives her a low to moderate risk for surgery, and assuming her chest CT is negative for metastatic disease, we will get her on the schedule for dx laparoscopy and whipple.   60 min spent in evaluation, examination, counseling, and coordination of care. >50% spent in counseling. Current Plans  CT CHEST W CON ([id number]) Schedule for Surgery Pt Education - flb whipple pt info Referred to Genetic Counseling, for evaluation and follow up (Medical Genetics). FAMILY HISTORY OF BREAST CANCER IN FIRST DEGREE RELATIVE (V16.3  Z80.3) FAMILY HISTORY OF MALIGNANT NEOPLASM OF COLON IN FIRST DEGREE RELATIVE DIAGNOSED WHEN YOUNGER THAN 67 YEARS OF AGE (V16.0  Z80.0) HISTORY OF ENDOMETRIAL CANCER (V10.42   Z85.42)    Signed by Savannah Klein, [name] (08/03/2014 2:24 AM)

## 2014-08-30 NOTE — Anesthesia Postprocedure Evaluation (Signed)
  Anesthesia Post-op Note  Patient: LYNDA WANNINGER  Procedure(s) Performed: Procedure(s): WHIPPLE PROCEDURE (N/A) LAPAROSCOPY DIAGNOSTIC (N/A)  Patient Location: PACU  Anesthesia Type:General  Level of Consciousness: awake  Airway and Oxygen Therapy: Patient Spontanous Breathing and Patient connected to nasal cannula oxygen  Post-op Pain: mild  Post-op Assessment: Post-op Vital signs reviewed, Patient's Cardiovascular Status Stable, Respiratory Function Stable, Patent Airway, No signs of Nausea or vomiting and Pain level controlled  Post-op Vital Signs: Reviewed and stable  Last Vitals:  Filed Vitals:   08/30/14 1500  BP:   Pulse:   Temp: 36.6 C  Resp:     Complications: No apparent anesthesia complications
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
## 2014-08-30 NOTE — Progress Notes (Signed)
Paged Dr. Barry Dienes for orders at [phone].

## 2014-08-30 NOTE — Op Note (Signed)
PREOPERATIVE DIAGNOSIS: pancreatic cancer  POSTOPERATIVE DIAGNOSIS: Same.   PROCEDURES PERFORMED:  Diagnostic laparoscopy  Classic pancreaticoduodenectomy   Placement of pancreatic stent   SURGEON: [name], MD   ASSISTANT: Jackolyn Confer, Fanny Skates   ANESTHESIA: General and epidural   FINDINGS: 2.5 cm pancreatic head mass. Soft pancreatic tissue. 10 mm common bile duct. 5 mm pancreatic duct  SPECIMENS:  1. Pancreaticoduodenectomy with gallbladder:  2. Common hepatic artery nodes   ESTIMATED BLOOD LOSS: 250 mL.   COMPLICATIONS: None known.   PROCEDURE:   Pt was identified in the holding area and taken to  the operating room, and placed supine on the operating room  table. General anesthesia was induced. The patient's abdomen was  prepped and draped in a sterile fashion, after a Foley catheter was  placed. A time-out was performed according to the surgical safety check  list. When all was correct we continued.   The patient was placed in reverse trendelenburg position and rotated to the right.  The left subcostal margin was anesthetized with local anesthesia.  A 5 mm optiview trocar was placed under direct visualization.  The abdomen was insufflated with carbon dioxide.  The abdomen was examined.  A second port was placed in the RLQto be able to better visualize the right liver.  No evidence of metastatic disease was seen.    A midline incision was made from the xiphoid to just below the umbilicus. . The subcutaneous tissues were divided with the Bovie cautery. The peritoneum was entered in the center of the abdomen. Digital retraction was then used to elevate the preperitoneal fat, and this was taken with the cautery as well.  Care was taken to protect the underlying viscera. The Bookwalter self-retaining retractor was placed  for visualization. The right colon was taken down off of the white line  of Toldt and from the retroperitoneum at the hepatic flexure. The  porta was identified. The  duodenum was kocherized extensively with blunt dissection and with cautery. The gallbladder was taken off the liver with a combination of blunt dissection and cautery. The cystic duct was clipped with the Hemalock clips. The cystic duct was divided and the gallbladder was passed off.   The common bile duct was skeletonized near the duodenum. A vessel loop was passed around it. The gastroduodenal artery, as well as the common hepatic artery were skeletonized. The proper hepatic artery was traced out to make sure that flow was going to both sides of the liver when the GDA was clamped. The GDA was test clamped with the bulldog, with good flow to the liver and  no signs of ischemia. This was divided with 2-0 silk ties and then clipped. The proper hepatic artery was reflected upward, and the anterior portal vein was exposed.  A Kelly clamp was passed underneath the pancreas at the superior mesenteric vein, and this passed  easily with no signs of tumor involvement.   Attention was then directed to the stomach, and the omentum was taken  off of the stomach at the border of the antrum and the body. The  gastrohepatic ligament was taken down with the harmonic, and care was  taken to make sure there was not a replaced left hepatic artery in this  location. The stomach was divided with the GIA-75 stapler. The border  of the stomach was oversewn with a 3-0 running PDS suture.   Attention was then directed to the small bowel. Around 10 cm past the  ligament of  Treitz was located, and this was divided with the 75-GIA.  The distal portion of the jejunum was also oversewn with a 3-0 PDS  suture. The fourth portion of the duodenum was skeletonized with the  harmonic scalpel, taking down all of the mesenteric vessels. The  ligament of Treitz was taken down. The IMV was preserved.  The duodenum was then passed underneath the portal vein.   At this point the Claiborne Billings was replaced and the  pancreas was divided with the cautery. 2-0 silk sutures were tied down and the inferior and superior border of the pancreas. The Bovie was used to coagulate the small bleeders at the border of the pancreas.  The Overholt in combination with the harmonic and locking Weck clips  were then used to take the uncinate process off of the portal vein and  the superior mesenteric artery. Care was taken not to incorporate the  superior mesenteric artery in the dissection. The specimen was then marked and passed off the table for frozen section margin.   The jejunum was then passed underneath  the SMV, in order to get appropriate lie for the pancreatic and biliary  anastomoses. The more distal portion of the jejunum was pulled up over  the colon, and two 3-0 silks were placed through the posterior border  of the stomach for the gastrojejunostomy. The stomach and the small  bowel were opened, and a GIA-75 was used to create an end-to-end  anastomosis. The open areas of the staple line were examined to ensure  that there was hemostasis. The defect was then closed with a single  layer of running Connell suture of 3-0 PDS. Prior to a complete  closure, the NG tube was passed toward the afferent limb.   The appropriate location for the choledochojejunostomy was identified, and  the small bowel was opened approximately 10 mm. The anastamosis was created with approximately eleven 4-0 interrupted PDS sutures.   The 2 corner sutures were placed first  and then the posterior layer was done in an interrupted fashion tying on  the inside. The superior layer was then closed with interrupted sutures as  well. A 2-0 vicryl was used to secure the hepaticojejunostomy to the retroperitoneum to avoid kinking.    At this point the frozens returned back as all negative. The pancreatic  anastomosis was then created by opening the muscular layer of the jejunum the length  of the pancreatic parenchyma, and the mucosa  approximately 4 mm. The pancreas was relatively soft, but prominent, and the duct was 5 mm. A pediatric feeding tube was used as a pancreatic stent. The posterior layer was formed first with 2-0 silk sutures in interrupted fashion. A duct to mucosa anastamosis was formed with six 5-0 PDS sutures. The anterior layer was then oversewn with 2-0 silks to dunk the pancreatic parenchyma.   The areas were then irrigated and then those anastomoses were covered  with Evicel. This was allowed to dry.  The abdomen was then irrigated with water irrigation and all the laparotomy sponges were removed. A lap count was performed, which was correct. Two 19-Blake drains were placed, with the  lateral-most drain placed behind the choledochojejunostomy. The medial  Blake drain was placed just anterior and slightly superior to the  pancreaticojejunostomy. The OnQ tunnelers were placed on either side of the fascial incision. The fascia was then closed with #1 looped running PDS sutures. The skin was irrigated and then closed with  staples. The wounds were cleaned, dried  and dressed with a sterile  dressing.   The patient tolerated the procedure well and was extubated and taken to  PACU in stable condition. Needle and sponge counts were correct x2.

## 2014-08-30 NOTE — Discharge Instructions (Addendum)
[facility]      [facility] °[phone] ° °ABDOMINAL SURGERY: POST OP INSTRUCTIONS ° °Always review your discharge instruction sheet given to you by the facility where your surgery was performed. ° °IF YOU HAVE DISABILITY OR FAMILY LEAVE FORMS, YOU MUST BRING THEM TO THE OFFICE FOR PROCESSING.  PLEASE DO NOT GIVE THEM TO YOUR DOCTOR. ° °1. A prescription for pain medication may be given to you upon discharge.  Take your pain medication as prescribed, if needed.  If narcotic pain medicine is not needed, then you may take acetaminophen (Tylenol) or ibuprofen (Advil) as needed. °2. Take your usually prescribed medications unless otherwise directed. °3. If you need a refill on your pain medication, please contact your pharmacy. They will contact our office to request authorization.  Prescriptions will not be filled after 5pm or on week-ends. °4. You should follow a light diet the first few days after arrival home, such as soup and crackers, pudding, etc.unless your doctor has advised otherwise. A high-fiber, low fat diet can be resumed as tolerated.   Be sure to include lots of fluids daily. Most patients will experience some swelling and bruising on the chest and neck area.  Ice packs will help.  Swelling and bruising can take several days to resolve °5. Most patients will experience some swelling and bruising in the area of the incision. Ice pack will help. Swelling and bruising can take several days to resolve..  °6. It is common to experience some constipation if taking pain medication after surgery.  Increasing fluid intake and taking a stool softener will usually help or prevent this problem from occurring.  A mild laxative (Milk of Magnesia or Miralax) should be taken according to package directions if there are no bowel movements after 48 hours. °7.  You may have steri-strips (small skin tapes) in place directly over the incision.  These strips should be left on the skin for 10-14 days.  If your  surgeon used skin glue on the incision, you may shower in 48 hours.  The glue will flake off over the next 2-3 weeks.  Any sutures or staples will be removed at the office during your follow-up visit. You may find that a light gauze bandage over your incision may keep your staples from being rubbed or pulled. You may shower and replace the bandage daily. °8. ACTIVITIES:  You may resume regular (light) daily activities beginning the next day--such as daily self-care, walking, climbing stairs--gradually increasing activities as tolerated.  You may have sexual intercourse when it is comfortable.  Refrain from any heavy lifting or straining until approved by your doctor. °a. You may drive when you no longer are taking prescription pain medication, you can comfortably wear a seatbelt, and you can safely maneuver your car and apply brakes °b. Return to Work: __________8 weeks if applicable_________________________ °9. You should see your doctor in the office for a follow-up appointment approximately two weeks after your surgery.  Make sure that you call for this appointment within a day or two after you arrive home to insure a convenient appointment time. °OTHER INSTRUCTIONS:  °_____________________________________________________________ °_____________________________________________________________ ° °WHEN TO CALL YOUR DOCTOR: °1. Fever over 101.0 °2. Inability to urinate °3. Nausea and/or vomiting °4. Extreme swelling or bruising °5. Continued bleeding from incision. °6. Increased pain, redness, or drainage from the incision. °7. Difficulty swallowing or breathing °8. Muscle cramping or spasms. °9. Numbness or tingling in hands or feet or around lips. ° °The clinic staff is   available to answer your questions during regular business hours.  Please dont hesitate to call and ask to speak to one of the nurses if you have concerns.  For further questions, please visit [email or web address]   Information on my  medicine - XARELTO (Rivaroxaban)  This medication education was reviewed with me or my healthcare representative as part of my discharge preparation.  Why was Xarelto prescribed for you? Xarelto was prescribed for you to reduce the risk of a blood clot forming that can cause a stroke if you have a medical condition called atrial fibrillation (a type of irregular heartbeat).  What do you need to know about xarelto ? Take your Xarelto ONCE DAILY at the same time every day with your evening meal. If you have difficulty swallowing the tablet whole, you may crush it and mix in applesauce just prior to taking your dose.  Take Xarelto exactly as prescribed by your doctor and DO NOT stop taking Xarelto without talking to the doctor who prescribed the medication.  Stopping without other stroke prevention medication to take the place of Xarelto may increase your risk of developing a clot that causes a stroke.  Refill your prescription before you run out.  After discharge, you should have regular check-up appointments with your healthcare provider that is prescribing your Xarelto.  In the future your dose may need to be changed if your kidney function or weight changes by a significant amount.  What do you do if you miss a dose? If you are taking Xarelto ONCE DAILY and you miss a dose, take it as soon as you remember on the same day then continue your regularly scheduled once daily regimen the next day. Do not take two doses of Xarelto at the same time or on the same day.   Important Safety Information A possible side effect of Xarelto is bleeding. You should call your healthcare provider right away if you experience any of the following: ? Bleeding from an injury or your nose that does not stop. ? Unusual colored urine (red or dark brown) or unusual colored stools (red or black). ? Unusual bruising for unknown reasons. ? A serious fall or if you hit your head (even if there is no  bleeding).  Some medicines may interact with Xarelto and might increase your risk of bleeding while on Xarelto. To help avoid this, consult your healthcare provider or pharmacist prior to using any new prescription or non-prescription medications, including herbals, vitamins, non-steroidal anti-inflammatory drugs (NSAIDs) and supplements.  This website has more information on Xarelto: [email or web address]

## 2014-08-30 NOTE — Progress Notes (Signed)
Utilization Review Completed.Jerrald Doverspike T12/04/2014  

## 2014-08-30 NOTE — Transfer of Care (Signed)
Immediate Anesthesia Transfer of Care Note  Patient: [name]  Procedure(s) Performed: Procedure(s): WHIPPLE PROCEDURE (N/A) LAPAROSCOPY DIAGNOSTIC (N/A)  Patient Location: PACU  Anesthesia Type:General  Level of Consciousness: awake and alert   Airway & Oxygen Therapy: Patient Spontanous Breathing and Patient connected to face mask oxygen  Post-op Assessment: Report given to PACU RN, Post -op Vital signs reviewed and stable, Patient moving all extremities and Patient moving all extremities X 4  Post vital signs: Reviewed and stable  Complications: No apparent anesthesia complications

## 2014-08-30 NOTE — Anesthesia Preprocedure Evaluation (Addendum)
Anesthesia Evaluation  Patient identified by MRN, date of birth, ID band Patient awake    Reviewed: Allergy & Precautions, H&P , NPO status , Patient's Chart, lab work & pertinent test results  History of Anesthesia Complications Negative for: history of anesthetic complications  Airway Mallampati: II  TM Distance: >3 FB Neck ROM: Full    Dental  (+) Upper Dentures   Pulmonary sleep apnea ,  breath sounds clear to auscultation  Pulmonary exam normal       Cardiovascular hypertension, Pt. on medications - angina- Past MI and - CHF + dysrhythmias Atrial Fibrillation Rhythm:Regular     Neuro/Psych negative neurological ROS  negative psych ROS   GI/Hepatic GERD-  Medicated and Controlled,  Endo/Other  Morbid obesity  Renal/GU negative Renal ROS     Musculoskeletal  (+) Arthritis -,   Abdominal   Peds  Hematology  (+) anemia ,   Anesthesia Other Findings   Reproductive/Obstetrics                           Anesthesia Physical Anesthesia Plan  ASA: III  Anesthesia Plan: General   Post-op Pain Management:    Induction: Intravenous  Airway Management Planned: Oral ETT  Additional Equipment: Arterial line  Intra-op Plan:   Post-operative Plan: Extubation in OR and Possible Post-op intubation/ventilation  Informed Consent: I have reviewed the patients History and Physical, chart, labs and discussed the procedure including the risks, benefits and alternatives for the proposed anesthesia with the patient or authorized representative who has indicated his/her understanding and acceptance.   Dental advisory given  Plan Discussed with: CRNA and Surgeon  Anesthesia Plan Comments:        Anesthesia Quick Evaluation

## 2014-08-31 ENCOUNTER — Encounter (HOSPITAL_COMMUNITY): Payer: Self-pay | Admitting: General Surgery

## 2014-08-31 LAB — CBC
HEMATOCRIT: 31.9 % — AB (ref 36.0–46.0)
Hemoglobin: 11 g/dL — ABNORMAL LOW (ref 12.0–15.0)
MCH: 28.9 pg (ref 26.0–34.0)
MCHC: 34.5 g/dL (ref 30.0–36.0)
MCV: 83.9 fL (ref 78.0–100.0)
PLATELETS: 280 10*3/uL (ref 150–400)
RBC: 3.8 MIL/uL — AB (ref 3.87–5.11)
RDW: 13.4 % (ref 11.5–15.5)
WBC: 17.4 10*3/uL — AB (ref 4.0–10.5)

## 2014-08-31 LAB — GLUCOSE, CAPILLARY
GLUCOSE-CAPILLARY: 169 mg/dL — AB (ref 70–99)
GLUCOSE-CAPILLARY: 191 mg/dL — AB (ref 70–99)
GLUCOSE-CAPILLARY: 234 mg/dL — AB (ref 70–99)
Glucose-Capillary: 236 mg/dL — ABNORMAL HIGH (ref 70–99)
Glucose-Capillary: 182 mg/dL — ABNORMAL HIGH (ref 70–99)
Glucose-Capillary: 190 mg/dL — ABNORMAL HIGH (ref 70–99)

## 2014-08-31 LAB — PROTIME-INR
INR: 1.2 (ref 0.00–1.49)
Prothrombin Time: 15.3 seconds — ABNORMAL HIGH (ref 11.6–15.2)

## 2014-08-31 LAB — COMPREHENSIVE METABOLIC PANEL
ALT: 88 U/L — ABNORMAL HIGH (ref 0–35)
ANION GAP: 14 (ref 5–15)
AST: 82 U/L — ABNORMAL HIGH (ref 0–37)
Albumin: 2.8 g/dL — ABNORMAL LOW (ref 3.5–5.2)
Alkaline Phosphatase: 77 U/L (ref 39–117)
BUN: 16 mg/dL (ref 6–23)
CALCIUM: 8.6 mg/dL (ref 8.4–10.5)
CHLORIDE: 99 meq/L (ref 96–112)
CO2: 20 meq/L (ref 19–32)
CREATININE: 0.58 mg/dL (ref 0.50–1.10)
GFR calc Af Amer: >90 mL/min (ref >90)
Glucose, Bld: 227 mg/dL — ABNORMAL HIGH (ref 70–99)
Potassium: 4.5 mEq/L (ref 3.7–5.3)
Sodium: 133 mEq/L — ABNORMAL LOW (ref 137–147)
Total Bilirubin: 0.4 mg/dL (ref 0.3–1.2)
Total Protein: 6.1 g/dL (ref 6.0–8.3)

## 2014-08-31 LAB — HEPARIN LEVEL (UNFRACTIONATED): HEPARIN UNFRACTIONATED: 0.31 [IU]/mL (ref 0.30–0.70)

## 2014-08-31 LAB — PHOSPHORUS: PHOSPHORUS: 2.8 mg/dL (ref 2.3–4.6)

## 2014-08-31 LAB — MAGNESIUM: MAGNESIUM: 1.7 mg/dL (ref 1.5–2.5)

## 2014-08-31 MED ORDER — INSULIN ASPART 100 UNIT/ML ~~LOC~~ SOLN
0.0000 [IU] | SUBCUTANEOUS | Status: DC
  Administered 2014-08-31: 5 [IU] via SUBCUTANEOUS
  Administered 2014-08-31 (×3): 3 [IU] via SUBCUTANEOUS
  Administered 2014-09-01: 2 [IU] via SUBCUTANEOUS
  Administered 2014-09-01 (×2): 3 [IU] via SUBCUTANEOUS
  Administered 2014-09-01: 2 [IU] via SUBCUTANEOUS
  Administered 2014-09-01: 3 [IU] via SUBCUTANEOUS
  Administered 2014-09-02 (×2): 2 [IU] via SUBCUTANEOUS
  Administered 2014-09-02: 3 [IU] via SUBCUTANEOUS
  Administered 2014-09-02: 2 [IU] via SUBCUTANEOUS

## 2014-08-31 NOTE — Progress Notes (Signed)
1 Day Post-Op  Subjective: Pt doing well other than pain.  No nausea.  Bp better controlled.  Still having some hyperglycemia.    Objective: Vital signs in last 24 hours: Temp:  [97.8 F (36.6 C)-99 F (37.2 C)] 99 F (37.2 C) (12/09 0400) Pulse Rate:  [61-88] 88 (12/09 0700) Resp:  [11-23] 19 (12/09 0700) BP: (116-159)/(65-104) 124/78 mmHg (12/09 0700) SpO2:  [96 %-100 %] 97 % (12/09 0700) Arterial Line BP: (74-185)/(60-85) 74/66 mmHg (12/09 0700)    Intake/Output from previous day: 12/08 0701 - 12/09 0700 In: 4046 [I.V.:3866; NG/GT:30; IV Piggyback:150] Out: 1915 [Urine:1200; Emesis/NG output:30; Drains:385; Blood:300] Intake/Output this shift:    General appearance: alert and mild distress Resp: breathing comfortably GI: soft, approp tender, mildly distended, drains serosang  Lab Results:   Recent Labs  08/30/14 2042 08/31/14 0402  WBC 20.8* 17.4*  HGB 11.8* 11.0*  HCT 34.0* 31.9*  PLT 285 280   BMET  Recent Labs  08/30/14 0944 08/31/14 0402  NA 135* 133*  K 3.9 4.5  CL  --  99  CO2  --  20  GLUCOSE  --  227*  BUN  --  16  CREATININE  --  0.58  CALCIUM  --  8.6   PT/INR  Recent Labs  08/30/14 0558 08/31/14 0402  LABPROT 13.8 15.3*  INR 1.04 1.20   ABG  Recent Labs  08/30/14 0944  PHART 7.451*  HCO3 23.8    Studies/Results: No results found.  Anti-infectives: Anti-infectives    Start     Dose/Rate Route Frequency Ordered Stop   08/30/14 1530  cefOXitin (MEFOXIN) 1 g in dextrose 5 % 50 mL IVPB     1 g100 mL/hr over 30 Minutes Intravenous Every 6 hours 08/30/14 1453 08/31/14 0415   08/30/14 0730  ceFAZolin (ANCEF) IVPB 2 g/50 mL premix  Status:  Discontinued     2 g100 mL/hr over 30 Minutes Intravenous 3 times per day 08/30/14 0716 08/30/14 1453   08/30/14 0600  cefOXitin (MEFOXIN) 2 g in dextrose 5 % 50 mL IVPB     2 g100 mL/hr over 30 Minutes Intravenous On call to O.R. 08/29/14 1405 08/30/14 0840      Assessment/Plan: s/p  Procedure(s): WHIPPLE PROCEDURE (N/A) LAPAROSCOPY DIAGNOSTIC (N/A) Continue foley due to strict I&O, patient critically ill and urinary output monitoring Stay in ICU for procainamide gtt  Plan for NGT out tomorrow unless significant nausea. Would plan to start flecainide tomorrow.   Heparin gtt for atrial fibrillation. In sinus rhythm now, but high risk for a fib given fluid shifts.   On metoprolol for hypertension, improved today. Hyperglycemia- increase sliding scale insulin.   LOS: 1 day    [facility] 08/31/2014
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
## 2014-08-31 NOTE — Progress Notes (Signed)
Inpatient Diabetes Program Recommendations  AACE/ADA: New Consensus Statement on Inpatient Glycemic Control (2013)  Target Ranges:  Prepandial:   less than 140 mg/dL      Peak postprandial:   less than 180 mg/dL (1-2 hours)      Critically ill patients:  140 - 180 mg/dL   Reason for Assessment:  Results for MILLENIA, WALDVOGEL (MRN [id number]) as of 08/31/2014 12:47  Ref. Range 08/30/2014 19:21 08/30/2014 23:41 08/31/2014 03:48 08/31/2014 07:46 08/31/2014 12:18  Glucose-Capillary Latest Range: 70-99 mg/dL 264 (H) 275 (H) 236 (H) 182 (H) 190 (H)   Diabetes history: None  Current orders for Inpatient glycemic control:  Novolog moderate q 4 hours  Please consider adding Lantus 15 units daily due to elevated BG's and post-whipple procedure.  Thanks, Adah Perl, RN, BC-ADM Inpatient Diabetes Coordinator Pager [phone]

## 2014-09-01 ENCOUNTER — Inpatient Hospital Stay: Admit: 2014-09-01 | Payer: Self-pay | Admitting: General Surgery

## 2014-09-01 LAB — CBC
HCT: 28.7 % — ABNORMAL LOW (ref 36.0–46.0)
HEMOGLOBIN: 9.7 g/dL — AB (ref 12.0–15.0)
MCH: 28.7 pg (ref 26.0–34.0)
MCHC: 33.8 g/dL (ref 30.0–36.0)
MCV: 84.9 fL (ref 78.0–100.0)
PLATELETS: 272 10*3/uL (ref 150–400)
RBC: 3.38 MIL/uL — AB (ref 3.87–5.11)
RDW: 13.6 % (ref 11.5–15.5)
WBC: 14.9 10*3/uL — AB (ref 4.0–10.5)

## 2014-09-01 LAB — HEPARIN LEVEL (UNFRACTIONATED)
Heparin Unfractionated: 0.27 IU/mL — ABNORMAL LOW (ref 0.30–0.70)
Heparin Unfractionated: 0.38 IU/mL (ref 0.30–0.70)

## 2014-09-01 LAB — GLUCOSE, CAPILLARY
GLUCOSE-CAPILLARY: 120 mg/dL — AB (ref 70–99)
Glucose-Capillary: 175 mg/dL — ABNORMAL HIGH (ref 70–99)
Glucose-Capillary: 126 mg/dL — ABNORMAL HIGH (ref 70–99)

## 2014-09-01 LAB — COMPREHENSIVE METABOLIC PANEL
ALK PHOS: 72 U/L (ref 39–117)
ALT: 78 U/L — AB (ref 0–35)
AST: 78 U/L — AB (ref 0–37)
Albumin: 2.6 g/dL — ABNORMAL LOW (ref 3.5–5.2)
Anion gap: 11 (ref 5–15)
BUN: 7 mg/dL (ref 6–23)
CHLORIDE: 92 meq/L — AB (ref 96–112)
CO2: 21 meq/L (ref 19–32)
Calcium: 8.4 mg/dL (ref 8.4–10.5)
Creatinine, Ser: 0.52 mg/dL (ref 0.50–1.10)
GLUCOSE: 193 mg/dL — AB (ref 70–99)
POTASSIUM: 4.5 meq/L (ref 3.7–5.3)
SODIUM: 124 meq/L — AB (ref 137–147)
Total Bilirubin: 0.5 mg/dL (ref 0.3–1.2)
Total Protein: 5.8 g/dL — ABNORMAL LOW (ref 6.0–8.3)

## 2014-09-01 SURGERY — WHIPPLE PROCEDURE
Anesthesia: General

## 2014-09-01 MED ORDER — PANTOPRAZOLE SODIUM 40 MG PO TBEC
40.0000 mg | DELAYED_RELEASE_TABLET | Freq: Every day | ORAL | Status: DC
  Administered 2014-09-01: 40 mg via ORAL
  Filled 2014-09-01: qty 1

## 2014-09-01 MED ORDER — INSULIN GLARGINE 100 UNIT/ML ~~LOC~~ SOLN
5.0000 [IU] | Freq: Every day | SUBCUTANEOUS | Status: DC
  Administered 2014-09-01 – 2014-09-05 (×5): 5 [IU] via SUBCUTANEOUS
  Administered 2014-09-06: 3 [IU] via SUBCUTANEOUS
  Administered 2014-09-07 – 2014-09-09 (×3): 5 [IU] via SUBCUTANEOUS
  Filled 2014-09-01 (×10): qty 0.05

## 2014-09-01 MED ORDER — SODIUM CHLORIDE 0.9 % IV SOLN
INTRAVENOUS | Status: DC
  Administered 2014-09-01 – 2014-09-02 (×3): via INTRAVENOUS
  Administered 2014-09-04: 75 mL/h via INTRAVENOUS
  Administered 2014-09-05 – 2014-09-08 (×5): via INTRAVENOUS

## 2014-09-01 MED ORDER — METOPROLOL TARTRATE 12.5 MG HALF TABLET
12.5000 mg | ORAL_TABLET | Freq: Two times a day (BID) | ORAL | Status: DC
  Administered 2014-09-01 – 2014-09-02 (×2): 12.5 mg via ORAL
  Filled 2014-09-01 (×3): qty 1

## 2014-09-01 MED ORDER — METOPROLOL TARTRATE 1 MG/ML IV SOLN
5.0000 mg | Freq: Four times a day (QID) | INTRAVENOUS | Status: DC
  Administered 2014-09-01 (×2): 5 mg via INTRAVENOUS
  Filled 2014-09-01 (×2): qty 5

## 2014-09-01 MED ORDER — FLECAINIDE ACETATE 100 MG PO TABS
100.0000 mg | ORAL_TABLET | Freq: Two times a day (BID) | ORAL | Status: DC
  Administered 2014-09-01 – 2014-09-02 (×3): 100 mg via ORAL
  Filled 2014-09-01 (×4): qty 1

## 2014-09-01 MED ORDER — LOSARTAN POTASSIUM 50 MG PO TABS
50.0000 mg | ORAL_TABLET | Freq: Every morning | ORAL | Status: DC
  Administered 2014-09-01 – 2014-09-03 (×3): 50 mg via ORAL
  Filled 2014-09-01 (×4): qty 1

## 2014-09-01 NOTE — Progress Notes (Signed)
ANTICOAGULATION CONSULT NOTE - Follow Up Consult  Pharmacy Consult for Heparin Indication: atrial fibrillation  Allergies  Allergen Reactions  . Codeine Other (See Comments)    Stomach pain  . Morphine And Related Other (See Comments)    Severe headache  . Sulfa Antibiotics Hives    Patient Measurements: Height: 5\' 3"  (160 cm) Weight: 189 lb 9.5 oz (86 kg) IBW/kg (Calculated) : 52.4 Heparin Dosing Weight: 70 kg  Vital Signs: Temp: 99.2 F (37.3 C) (12/10 1201) Temp Source: Oral (12/10 1201) BP: 136/69 mmHg (12/10 1200) Pulse Rate: 77 (12/10 1200)  Labs:  Recent Labs  08/30/14 0558  08/30/14 2042 08/31/14 0402 08/31/14 1429 09/01/14 0500 09/01/14 1320  HGB  --   < > 11.8* 11.0*  --  9.7*  --   HCT  --   < > 34.0* 31.9*  --  28.7*  --   PLT  --   --  285 280  --  272  --   APTT 30  --   --   --   --   --   --   LABPROT 13.8  --   --  15.3*  --   --   --   INR 1.04  --   --  1.20  --   --   --   HEPARINUNFRC  --   --   --   --  0.31 0.27* 0.38  CREATININE  --   --   --  0.58  --  0.52  --   < > = values in this interval not displayed.  Estimated Creatinine Clearance: 70.9 mL/min (by C-G formula based on Cr of 0.52).  Assessment:  68 yo F on Xarelto PTA for hx afib. Xarelto was held for surgery (last dose 4 days prior to surgery). Post-op with plans to initiate heparin. 12/8: S/p diagnostic laparoscopy, pancreatic duodenectomy, pancreatic stent (Whipple procedure)  Anti-coagulation: Xarelto PTA, Heparin for Afib. Heparin level 0.27>>0.38 Post-op Hgb down to 9.7.  Infectious Disease: Post op abx complete  Cardiovascular: Afib, HTN. VSS on flecainide (K=4.5, Mg 1.7), losartan, metoprolol  Endocrinology: Hyperglycemia, SSI + Lantus 5/day, CBG 175-234  Nephrology: Scr and lytes stable. Na low 124  Pulmonary: RA  Hematology / Oncology: Hx of endometrial cancer s/p hysterectomy, pancreatic cancer now. Post-op anemia  PTA Medication Issues: none  Best  Practices: Heparin, PPI  Goal of Therapy:  Heparin level 0.3-0.7 units/ml Monitor platelets by anticoagulation protocol: Yes   Plan:  Continue heparin at 1200 units/hr Heparin level and CBC daily.   [name] S. [name], PharmD, BCPS Clinical Staff Pharmacist Pager [phone]  [name] 09/01/2014,1:54 PM

## 2014-09-01 NOTE — Progress Notes (Signed)
ANTICOAGULATION CONSULT NOTE - Follow Up Consult  Pharmacy Consult for heparin Indication: atrial fibrillation   Labs:  Recent Labs  08/30/14 0558  08/30/14 2042 08/31/14 0402 08/31/14 1429 09/01/14 0500  HGB  --   < > 11.8* 11.0*  --  9.7*  HCT  --   < > 34.0* 31.9*  --  28.7*  PLT  --   --  285 280  --  272  APTT 30  --   --   --   --   --   LABPROT 13.8  --   --  15.3*  --   --   INR 1.04  --   --  1.20  --   --   HEPARINUNFRC  --   --   --   --  0.31 0.27*  CREATININE  --   --   --  0.58  --   --   < > = values in this interval not displayed.    Assessment: 68yo female now slightly subtherapeutic on heparin after one level at low end of goal.  Goal of Therapy:  Heparin level 0.3-0.7 units/ml   Plan:  Will increase heparin gtt slightly to 1200 units/hr and check level in 6hr.  Wynona Neat, PharmD, BCPS  09/01/2014,6:20 AM

## 2014-09-01 NOTE — Plan of Care (Signed)
Problem: Phase I Progression Outcomes Goal: Pain controlled with appropriate interventions Outcome: Completed/Met Date Met:  09/01/14 Goal: OOB as tolerated unless otherwise ordered Outcome: Completed/Met Date Met:  09/01/14 Goal: Incision/dressings dry and intact Outcome: Completed/Met Date Met:  09/01/14 Goal: Sutures/staples intact Outcome: Completed/Met Date Met:  09/01/14 Goal: Tubes/drains patent Outcome: Completed/Met Date Met:  09/01/14 Goal: Initial discharge plan identified Outcome: Completed/Met Date Met:  09/01/14 Home with spouse.  May need HHRN depending on whether or not has a JP upon discharge. Goal: Voiding-avoid urinary catheter unless indicated Outcome: Not Applicable Date Met:  16/83/87 Goal: Vital signs/hemodynamically stable Outcome: Completed/Met Date Met:  09/01/14 Goal: Other Phase I Outcomes/Goals Outcome: Completed/Met Date Met:  09/01/14  Problem: Phase II Progression Outcomes Goal: Pain controlled Outcome: Completed/Met Date Met:  09/01/14 Goal: Progress activity as tolerated unless otherwise ordered Outcome: Completed/Met Date Met:  09/01/14 Goal: Progressing with IS, TCDB Outcome: Completed/Met Date Met:  09/01/14 Goal: Vital signs stable Outcome: Completed/Met Date Met:  09/01/14 Goal: Surgical site without signs of infection Outcome: Completed/Met Date Met:  09/01/14 Goal: Dressings dry/intact Outcome: Completed/Met Date Met:  09/01/14 Goal: Sutures/staples intact Outcome: Completed/Met Date Met:  09/01/14 Goal: Discharge plan established Outcome: Not Applicable Date Met:  06/58/26

## 2014-09-01 NOTE — Progress Notes (Signed)
Patient ID: SURA CANUL, female   DOB: 1945-12-06, 68 y.o.   MRN: [id number] 2 Days Post-Op  Subjective: Pt pain improved.  Main complaint is throat pain.    Objective: Vital signs in last 24 hours: Temp:  [98.3 F (36.8 C)-98.8 F (37.1 C)] 98.5 F (36.9 C) (12/10 0420) Pulse Rate:  [77-110] 88 (12/10 0700) Resp:  [15-28] 18 (12/10 0700) BP: (111-146)/(55-76) 137/66 mmHg (12/10 0700) SpO2:  [95 %-99 %] 95 % (12/10 0700) Arterial Line BP: (74-160)/(63-83) 124/66 mmHg (12/10 0700) Weight:  [189 lb 9.5 oz (86 kg)] 189 lb 9.5 oz (86 kg) (12/10 0500)    Intake/Output from previous day: 12/09 0701 - 12/10 0700 In: 3192 [I.V.:3102; NG/GT:90] Out: 1570 [Urine:1180; Emesis/NG output:100; Drains:290] Intake/Output this shift:    General appearance: alert and mild distress Resp: breathing comfortably GI: soft, approp tender, mildly distended, drains serosang  Lab Results:   Recent Labs  08/31/14 0402 09/01/14 0500  WBC 17.4* 14.9*  HGB 11.0* 9.7*  HCT 31.9* 28.7*  PLT 280 272   BMET  Recent Labs  08/31/14 0402 09/01/14 0500  NA 133* 124*  K 4.5 4.5  CL 99 92*  CO2 20 21  GLUCOSE 227* 193*  BUN 16 7  CREATININE 0.58 0.52  CALCIUM 8.6 8.4   PT/INR  Recent Labs  08/30/14 0558 08/31/14 0402  LABPROT 13.8 15.3*  INR 1.04 1.20   ABG  Recent Labs  08/30/14 0944  PHART 7.451*  HCO3 23.8    Studies/Results: No results found.  Anti-infectives: Anti-infectives    Start     Dose/Rate Route Frequency Ordered Stop   08/30/14 1530  cefOXitin (MEFOXIN) 1 g in dextrose 5 % 50 mL IVPB     1 g100 mL/hr over 30 Minutes Intravenous Every 6 hours 08/30/14 1453 08/31/14 0415   08/30/14 0730  ceFAZolin (ANCEF) IVPB 2 g/50 mL premix  Status:  Discontinued     2 g100 mL/hr over 30 Minutes Intravenous 3 times per day 08/30/14 0716 08/30/14 1453   08/30/14 0600  cefOXitin (MEFOXIN) 2 g in dextrose 5 % 50 mL IVPB     2 g100 mL/hr over 30 Minutes Intravenous On call  to O.R. 08/29/14 1405 08/30/14 0840      Assessment/Plan: s/p Procedure(s): WHIPPLE PROCEDURE (N/A) LAPAROSCOPY DIAGNOSTIC (N/A) Continue foley for urinary output monitoring.   D/c NGT D/C procainamide, restart flecainide Hyponatremia - d/c d5 1/2 NS, start NS Heparin gtt for atrial fibrillation. In sinus rhythm now, but high risk for a fib given fluid shifts.   Add cozaar, decrease IV metoprolol for HTN  Hyperglycemia- increase sliding scale insulin, add lantus.   Anemia - dilutional, acute blood loss, and anemia of chronic disease combined.    LOS: 2 days    [facility] 09/01/2014

## 2014-09-02 ENCOUNTER — Inpatient Hospital Stay (HOSPITAL_COMMUNITY): Payer: Medicare Other

## 2014-09-02 DIAGNOSIS — I4891 Unspecified atrial fibrillation: Secondary | ICD-10-CM

## 2014-09-02 LAB — COMPREHENSIVE METABOLIC PANEL
ALK PHOS: 81 U/L (ref 39–117)
ALT: 60 U/L — AB (ref 0–35)
AST: 58 U/L — ABNORMAL HIGH (ref 0–37)
Albumin: 2.5 g/dL — ABNORMAL LOW (ref 3.5–5.2)
Anion gap: 15 (ref 5–15)
BUN: 6 mg/dL (ref 6–23)
CO2: 21 meq/L (ref 19–32)
Calcium: 8.4 mg/dL (ref 8.4–10.5)
Chloride: 97 mEq/L (ref 96–112)
Creatinine, Ser: 0.46 mg/dL — ABNORMAL LOW (ref 0.50–1.10)
GFR calc non Af Amer: >90 mL/min (ref >90)
GLUCOSE: 133 mg/dL — AB (ref 70–99)
Potassium: 4.2 mEq/L (ref 3.7–5.3)
SODIUM: 133 meq/L — AB (ref 137–147)
TOTAL PROTEIN: 5.7 g/dL — AB (ref 6.0–8.3)
Total Bilirubin: 0.4 mg/dL (ref 0.3–1.2)

## 2014-09-02 LAB — CBC
HCT: 27.3 % — ABNORMAL LOW (ref 36.0–46.0)
HEMOGLOBIN: 9.1 g/dL — AB (ref 12.0–15.0)
MCH: 28.6 pg (ref 26.0–34.0)
MCHC: 33.3 g/dL (ref 30.0–36.0)
MCV: 85.8 fL (ref 78.0–100.0)
Platelets: 242 10*3/uL (ref 150–400)
RBC: 3.18 MIL/uL — AB (ref 3.87–5.11)
RDW: 13.6 % (ref 11.5–15.5)
WBC: 11.3 10*3/uL — ABNORMAL HIGH (ref 4.0–10.5)

## 2014-09-02 LAB — GLUCOSE, CAPILLARY
GLUCOSE-CAPILLARY: 135 mg/dL — AB (ref 70–99)
GLUCOSE-CAPILLARY: 155 mg/dL — AB (ref 70–99)
Glucose-Capillary: 171 mg/dL — ABNORMAL HIGH (ref 70–99)
Glucose-Capillary: 126 mg/dL — ABNORMAL HIGH (ref 70–99)
Glucose-Capillary: 120 mg/dL — ABNORMAL HIGH (ref 70–99)
Glucose-Capillary: 120 mg/dL — ABNORMAL HIGH (ref 70–99)

## 2014-09-02 LAB — HEPARIN LEVEL (UNFRACTIONATED)
Heparin Unfractionated: 0.29 IU/mL — ABNORMAL LOW (ref 0.30–0.70)
Heparin Unfractionated: 0.43 IU/mL (ref 0.30–0.70)

## 2014-09-02 MED ORDER — AMIODARONE HCL IN DEXTROSE 360-4.14 MG/200ML-% IV SOLN
60.0000 mg/h | INTRAVENOUS | Status: AC
  Administered 2014-09-02 (×2): 60 mg/h via INTRAVENOUS
  Filled 2014-09-02: qty 400
  Filled 2014-09-02: qty 200

## 2014-09-02 MED ORDER — ALUM & MAG HYDROXIDE-SIMETH 200-200-20 MG/5ML PO SUSP
30.0000 mL | ORAL | Status: DC | PRN
  Administered 2014-09-02 – 2014-09-10 (×9): 30 mL via ORAL
  Filled 2014-09-02 (×9): qty 30

## 2014-09-02 MED ORDER — LORAZEPAM 2 MG/ML IJ SOLN
0.5000 mg | Freq: Four times a day (QID) | INTRAMUSCULAR | Status: DC | PRN
  Administered 2014-09-02 – 2014-09-09 (×10): 0.5 mg via INTRAVENOUS
  Filled 2014-09-02 (×12): qty 1

## 2014-09-02 MED ORDER — AMIODARONE LOAD VIA INFUSION
150.0000 mg | Freq: Once | INTRAVENOUS | Status: AC
  Administered 2014-09-02: 150 mg via INTRAVENOUS
  Filled 2014-09-02: qty 83.34

## 2014-09-02 MED ORDER — METOPROLOL TARTRATE 1 MG/ML IV SOLN
5.0000 mg | Freq: Four times a day (QID) | INTRAVENOUS | Status: DC
  Administered 2014-09-02 – 2014-09-05 (×12): 5 mg via INTRAVENOUS
  Filled 2014-09-02 (×17): qty 5

## 2014-09-02 MED ORDER — AMIODARONE HCL IN DEXTROSE 360-4.14 MG/200ML-% IV SOLN
30.0000 mg/h | INTRAVENOUS | Status: DC
  Administered 2014-09-03 – 2014-09-06 (×5): 30 mg/h via INTRAVENOUS
  Filled 2014-09-02 (×19): qty 200

## 2014-09-02 MED ORDER — METOPROLOL TARTRATE 1 MG/ML IV SOLN
INTRAVENOUS | Status: AC
Start: 2014-09-02 — End: 2014-09-02
  Filled 2014-09-02: qty 5

## 2014-09-02 MED ORDER — KETOROLAC TROMETHAMINE 15 MG/ML IJ SOLN
15.0000 mg | Freq: Four times a day (QID) | INTRAMUSCULAR | Status: DC
  Administered 2014-09-02 – 2014-09-05 (×11): 15 mg via INTRAVENOUS
  Filled 2014-09-02 (×17): qty 1

## 2014-09-02 MED ORDER — PANTOPRAZOLE SODIUM 40 MG IV SOLR
40.0000 mg | Freq: Two times a day (BID) | INTRAVENOUS | Status: DC
  Administered 2014-09-02 – 2014-09-04 (×5): 40 mg via INTRAVENOUS
  Filled 2014-09-02 (×8): qty 40

## 2014-09-02 MED ORDER — METOPROLOL TARTRATE 1 MG/ML IV SOLN
5.0000 mg | Freq: Once | INTRAVENOUS | Status: AC
  Administered 2014-09-02: 5 mg via INTRAVENOUS

## 2014-09-02 NOTE — Progress Notes (Signed)
Patient converted from NSR to rapid a fib with a rate up to 150. Blood pressure remains stable 140/90 (104). Dr. Dalbert Batman called and ordered 5 mg of lopressor IV. Rate now down to 120 but still in a fib. Cardiology MD now at beside. Another 5 mg of Lopressor given per order. Blood pressure 159/92 and remains in a fib with a rate from 102-120.   Will continue to monitor and await new order.  Sandre Kitty

## 2014-09-02 NOTE — Progress Notes (Addendum)
12 ml of dilaudid wasted in sink with Milinda Sweeney, RN. Sandre Kitty

## 2014-09-02 NOTE — Progress Notes (Signed)
Patient experiencing increased pain and acid reflux despite prn medications. Dr. Dalbert Batman paged and made aware. Abdominal x-ray ordered, will page if results are abnormal. Prn ativan ordered for anxiety. Will continue to monitor.  [name]
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
## 2014-09-02 NOTE — Progress Notes (Signed)
Dr. Rosendo Gros paged due to patient hallucinating and making weird comments at times. Full neuro assessment completed and nothing else is abnormal.  Patient reports last time she had dilaudid she experienced the same thing. Will no longer use PCA, instead toradol order.see mar.  Sandre Kitty

## 2014-09-02 NOTE — Progress Notes (Signed)
ANTICOAGULATION CONSULT NOTE - Follow Up Consult  Pharmacy Consult for Heparin Indication: atrial fibrillation  Allergies  Allergen Reactions  . Codeine Other (See Comments)    Stomach pain  . Morphine And Related Other (See Comments)    Severe headache  . Sulfa Antibiotics Hives    Patient Measurements: Height: 5\' 3"  (160 cm) Weight: 187 lb 13.3 oz (85.2 kg) IBW/kg (Calculated) : 52.4  Vital Signs: Temp: 98.5 F (36.9 C) (12/11 0000) Temp Source: Oral (12/11 0000) BP: 126/67 mmHg (12/11 0400) Pulse Rate: 88 (12/11 0400)  Labs:  Recent Labs  08/30/14 0558  08/31/14 0402  09/01/14 0500 09/01/14 1320 09/02/14 0312  HGB  --   < > 11.0*  --  9.7*  --  9.1*  HCT  --   < > 31.9*  --  28.7*  --  27.3*  PLT  --   < > 280  --  272  --  242  APTT 30  --   --   --   --   --   --   LABPROT 13.8  --  15.3*  --   --   --   --   INR 1.04  --  1.20  --   --   --   --   HEPARINUNFRC  --   --   --   < > 0.27* 0.38 0.29*  CREATININE  --   --  0.58  --  0.52  --  0.46*  < > = values in this interval not displayed.  Estimated Creatinine Clearance: 70.6 mL/min (by C-G formula based on Cr of 0.46).   Assessment: Slightly sub-therapeutic heparin level, other labs as above, no issues per RN.   Goal of Therapy:  Heparin level 0.3-0.7 units/ml Monitor platelets by anticoagulation protocol: Yes   Plan:  -Increase heparin to 1300 units/hr -1200 HL -Daily CBC/HL -Monitor for bleeding  Narda Bonds 09/02/2014,5:08 AM

## 2014-09-02 NOTE — Progress Notes (Signed)
Dr. Haroldine Laws canceled transfer orders due to cardiac events of this evening. Will continue to monitor.  Sandre Kitty

## 2014-09-02 NOTE — Progress Notes (Signed)
3 Days Post-Op  Subjective: Pain well controlled with PCA pump. Feels a little better than yesterday. Remains in sinus rhythm. Heart rate 94. Excellent urine output. Biggest complaint is reflux and heartburn. No vomiting. Occasional nausea. No stool or flatus. Ambulating a lot.   no breathing problems. SPO2 94-97% on room air. Hemoglobin 9.1. WBC 11,300. Total bilirubin 0.4. Glucose 133. Potassium 4.2.  Objective: Vital signs in last 24 hours: Temp:  [97.8 F (36.6 C)-99.2 F (37.3 C)] 98.5 F (36.9 C) (12/11 0000) Pulse Rate:  [77-99] 94 (12/11 0600) Resp:  [12-23] 14 (12/11 0600) BP: (126-156)/(60-99) 156/99 mmHg (12/11 0600) SpO2:  [90 %-97 %] 97 % (12/11 0600) Arterial Line BP: (124-127)/(66) 127/66 mmHg (12/10 0800) Weight:  [187 lb 13.3 oz (85.2 kg)] 187 lb 13.3 oz (85.2 kg) (12/11 0400)    Intake/Output from previous day: 12/10 0701 - 12/11 0700 In: 2014.8 [P.O.:60; I.V.:1954.8] Out: 2745 [Urine:2225; Drains:520] Intake/Output this shift: Total I/O In: 957.1 [I.V.:957.1] Out: 1440 [Urine:1100; Drains:340]  General appearance: Alert. Pleasant. Mild distress from reflux primarily. Resp: clear to auscultation bilaterally GI: Abdomen soft. Not distended. Wound clean. With drains with thin serosanguineous fluid. Rare bowel sounds.  Lab Results:  Results for orders placed or performed during the hospital encounter of 08/30/14 (from the past 24 hour(s))  Glucose, capillary     Status: Abnormal   Collection Time: 09/01/14  7:48 AM  Result Value Ref Range   Glucose-Capillary 175 (H) 70 - 99 mg/dL   Comment 1 Capillary Sample   Glucose, capillary     Status: Abnormal   Collection Time: 09/01/14 11:59 AM  Result Value Ref Range   Glucose-Capillary 126 (H) 70 - 99 mg/dL   Comment 1 Capillary Sample   Heparin level (unfractionated)     Status: None   Collection Time: 09/01/14  1:20 PM  Result Value Ref Range   Heparin Unfractionated 0.38 0.30 - 0.70 IU/mL  Glucose,  capillary     Status: Abnormal   Collection Time: 09/01/14  3:36 PM  Result Value Ref Range   Glucose-Capillary 120 (H) 70 - 99 mg/dL   Comment 1 Capillary Sample   Glucose, capillary     Status: Abnormal   Collection Time: 09/01/14  8:18 PM  Result Value Ref Range   Glucose-Capillary 135 (H) 70 - 99 mg/dL  CBC     Status: Abnormal   Collection Time: 09/02/14  3:12 AM  Result Value Ref Range   WBC 11.3 (H) 4.0 - 10.5 K/uL   RBC 3.18 (L) 3.87 - 5.11 MIL/uL   Hemoglobin 9.1 (L) 12.0 - 15.0 g/dL   HCT 27.3 (L) 36.0 - 46.0 %   MCV 85.8 78.0 - 100.0 fL   MCH 28.6 26.0 - 34.0 pg   MCHC 33.3 30.0 - 36.0 g/dL   RDW 13.6 11.5 - 15.5 %   Platelets 242 150 - 400 K/uL  Comprehensive metabolic panel     Status: Abnormal   Collection Time: 09/02/14  3:12 AM  Result Value Ref Range   Sodium 133 (L) 137 - 147 mEq/L   Potassium 4.2 3.7 - 5.3 mEq/L   Chloride 97 96 - 112 mEq/L   CO2 21 19 - 32 mEq/L   Glucose, Bld 133 (H) 70 - 99 mg/dL   BUN 6 6 - 23 mg/dL   Creatinine, Ser 0.46 (L) 0.50 - 1.10 mg/dL   Calcium 8.4 8.4 - 10.5 mg/dL   Total Protein 5.7 (L) 6.0 - 8.3 g/dL  Albumin 2.5 (L) 3.5 - 5.2 g/dL   AST 58 (H) 0 - 37 U/L   ALT 60 (H) 0 - 35 U/L   Alkaline Phosphatase 81 39 - 117 U/L   Total Bilirubin 0.4 0.3 - 1.2 mg/dL   GFR calc non Af Amer >90 >90 mL/min   GFR calc Af Amer >90 >90 mL/min   Anion gap 15 5 - 15  Heparin level (unfractionated)     Status: Abnormal   Collection Time: 09/02/14  3:12 AM  Result Value Ref Range   Heparin Unfractionated 0.29 (L) 0.30 - 0.70 IU/mL  Glucose, capillary     Status: Abnormal   Collection Time: 09/02/14  4:03 AM  Result Value Ref Range   Glucose-Capillary 155 (H) 70 - 99 mg/dL   Comment 1 Capillary Sample      Studies/Results: No results found.  Marland Kitchen antiseptic oral rinse  7 mL Mouth Rinse q12n4p  . chlorhexidine  15 mL Mouth Rinse BID  . flecainide  100 mg Oral BID  . fluticasone  1 spray Each Nare BID  . HYDROmorphone PCA 0.3 mg/mL    Intravenous 6 times per day  . insulin aspart  0-15 Units Subcutaneous 6 times per day  . insulin glargine  5 Units Subcutaneous Daily  . losartan  50 mg Oral q morning - 10a  . metoprolol tartrate  12.5 mg Oral BID  . pantoprazole (PROTONIX) IV  40 mg Intravenous Q12H     Assessment/Plan: s/p Procedure(s): WHIPPLE PROCEDURE LAPAROSCOPY DIAGNOSTIC  POD #3. Whipple procedure. Discontinue Foley Await transfer to stepdown Continue telemetry Continue oral flecanide Continue heparin drip for intermittent atrial fibrillation  continue po Cozaar Continue po Lopressor. On-Q pump out today. Continue PCA Dilaudid.  Reflux symptoms.  Symptomatically problematic for patient. I believe a combination of ileus, gastrojejunostomy, and antecedent reflux. Switch to bid IV proton pump inhibitors Allow Mylanta as needed Do not advance diet.  limited clear liquids.  Hyperglycemia. Improved with adjusted sliding scale insulin. Goal glucose 120-180.  Anemia. dilutional, chronic disease, acute blood loss. No evidence of ongoing bleeding. Stable. Continue IV at 75 mL per hour.  @PROBHOSP @  LOS: 3 days    [name] M 09/02/2014  . .prob

## 2014-09-02 NOTE — Progress Notes (Signed)
  Amiodarone Drug - Drug Interaction Consult Note  Recommendations:  No significant drug-drug interaction identified. Pharmacy will continue monitoring patient daily for heparin dosing.   Amiodarone is metabolized by the cytochrome P450 system and therefore has the potential to cause many drug interactions. Amiodarone has an average plasma half-life of 50 days (range 20 to 100 days).   There is potential for drug interactions to occur several weeks or months after stopping treatment and the onset of drug interactions may be slow after initiating amiodarone.   []  Statins: Increased risk of myopathy. Simvastatin- restrict dose to 20mg  daily. Other statins: counsel patients to report any muscle pain or weakness immediately.  []  Anticoagulants: Amiodarone can increase anticoagulant effect. Consider warfarin dose reduction. Patients should be monitored closely and the dose of anticoagulant altered accordingly, remembering that amiodarone levels take several weeks to stabilize.  []  Antiepileptics: Amiodarone can increase plasma concentration of phenytoin, the dose should be reduced. Note that small changes in phenytoin dose can result in large changes in levels. Monitor patient and counsel on signs of toxicity.  []  Beta blockers: increased risk of bradycardia, AV block and myocardial depression. Sotalol - avoid concomitant use.  []   Calcium channel blockers (diltiazem and verapamil): increased risk of bradycardia, AV block and myocardial depression.  []   Cyclosporine: Amiodarone increases levels of cyclosporine. Reduced dose of cyclosporine is recommended.  []  Digoxin dose should be halved when amiodarone is started.  []  Diuretics: increased risk of cardiotoxicity if hypokalemia occurs.  []  Oral hypoglycemic agents (glyburide, glipizide, glimepiride): increased risk of hypoglycemia. Patient's glucose levels should be monitored closely when initiating amiodarone therapy.   []  Drugs that prolong  the QT interval:  Torsades de pointes risk may be increased with concurrent use - avoid if possible.  Monitor QTc, also keep magnesium/potassium WNL if concurrent therapy can't be avoided. Marland Kitchen Antibiotics: e.g. fluoroquinolones, erythromycin. . Antiarrhythmics: e.g. quinidine, procainamide, disopyramide, sotalol. . Antipsychotics: e.g. phenothiazines, haloperidol.  . Lithium, tricyclic antidepressants, and methadone. Thank You,  Manley Mason   09/02/2014 4:44 PM

## 2014-09-02 NOTE — Progress Notes (Signed)
[facility] for Heparin Indication: atrial fibrillation  Allergies  Allergen Reactions  . Codeine Other (See Comments)    Stomach pain  . Morphine And Related Other (See Comments)    Severe headache  . Sulfa Antibiotics Hives    Labs:  Recent Labs  08/31/14 0402  09/01/14 0500 09/01/14 1320 09/02/14 0312 09/02/14 1211  HGB 11.0*  --  9.7*  --  9.1*  --   HCT 31.9*  --  28.7*  --  27.3*  --   PLT 280  --  272  --  242  --   LABPROT 15.3*  --   --   --   --   --   INR 1.20  --   --   --   --   --   HEPARINUNFRC  --   < > 0.27* 0.38 0.29* 0.43  CREATININE 0.58  --  0.52  --  0.46*  --   < > = values in this interval not displayed.  Estimated Creatinine Clearance: 70.6 mL/min (by C-G formula based on Cr of 0.46).    Assessment: 68 yo F on Xarelto PTA for hx afib.  Xarelto was held in anticipation of surgery (last dose 4 days prior to surgery).    Heparin level therapeutic this afternoon.  Goal of Therapy:  Heparin level 0.3-0.7 units/ml Monitor platelets by anticoagulation protocol: Yes   Plan:  Continue heparin at 1300 units / hr Next heparin level in AM Follow up plans for restarting oral anticoagulation.  Thank you Anette Guarneri, PharmD [phone] 09/02/2014 12:56 PM

## 2014-09-03 DIAGNOSIS — I480 Paroxysmal atrial fibrillation: Secondary | ICD-10-CM

## 2014-09-03 LAB — CBC
HEMATOCRIT: 25.9 % — AB (ref 36.0–46.0)
HEMOGLOBIN: 8.6 g/dL — AB (ref 12.0–15.0)
MCH: 28.5 pg (ref 26.0–34.0)
MCHC: 33.2 g/dL (ref 30.0–36.0)
MCV: 85.8 fL (ref 78.0–100.0)
Platelets: 264 10*3/uL (ref 150–400)
RBC: 3.02 MIL/uL — AB (ref 3.87–5.11)
RDW: 13.6 % (ref 11.5–15.5)
WBC: 10.1 10*3/uL (ref 4.0–10.5)

## 2014-09-03 LAB — GLUCOSE, CAPILLARY
GLUCOSE-CAPILLARY: 103 mg/dL — AB (ref 70–99)
GLUCOSE-CAPILLARY: 119 mg/dL — AB (ref 70–99)
GLUCOSE-CAPILLARY: 129 mg/dL — AB (ref 70–99)
Glucose-Capillary: 126 mg/dL — ABNORMAL HIGH (ref 70–99)
Glucose-Capillary: 107 mg/dL — ABNORMAL HIGH (ref 70–99)
Glucose-Capillary: 118 mg/dL — ABNORMAL HIGH (ref 70–99)
Glucose-Capillary: 111 mg/dL — ABNORMAL HIGH (ref 70–99)
Glucose-Capillary: 102 mg/dL — ABNORMAL HIGH (ref 70–99)

## 2014-09-03 LAB — COMPREHENSIVE METABOLIC PANEL
ALT: 47 U/L — ABNORMAL HIGH (ref 0–35)
ANION GAP: 14 (ref 5–15)
AST: 50 U/L — AB (ref 0–37)
Albumin: 2.4 g/dL — ABNORMAL LOW (ref 3.5–5.2)
Alkaline Phosphatase: 69 U/L (ref 39–117)
BILIRUBIN TOTAL: 0.3 mg/dL (ref 0.3–1.2)
BUN: 9 mg/dL (ref 6–23)
CHLORIDE: 98 meq/L (ref 96–112)
CO2: 23 meq/L (ref 19–32)
Calcium: 8.4 mg/dL (ref 8.4–10.5)
Creatinine, Ser: 0.57 mg/dL (ref 0.50–1.10)
GFR calc Af Amer: >90 mL/min (ref >90)
Glucose, Bld: 123 mg/dL — ABNORMAL HIGH (ref 70–99)
Potassium: 3.6 mEq/L — ABNORMAL LOW (ref 3.7–5.3)
Sodium: 135 mEq/L — ABNORMAL LOW (ref 137–147)
Total Protein: 5.6 g/dL — ABNORMAL LOW (ref 6.0–8.3)

## 2014-09-03 LAB — HEPARIN LEVEL (UNFRACTIONATED)
HEPARIN UNFRACTIONATED: 0.25 [IU]/mL — AB (ref 0.30–0.70)
HEPARIN UNFRACTIONATED: 0.36 [IU]/mL (ref 0.30–0.70)

## 2014-09-03 MED ORDER — KETOROLAC TROMETHAMINE 30 MG/ML IJ SOLN
INTRAMUSCULAR | Status: AC
  Administered 2014-09-03: 15 mg
  Filled 2014-09-03: qty 1

## 2014-09-03 NOTE — Progress Notes (Signed)
[facility] for Heparin Indication: atrial fibrillation  Allergies  Allergen Reactions  . Codeine Other (See Comments)    Stomach pain  . Morphine And Related Other (See Comments)    Severe headache  . Sulfa Antibiotics Hives    Labs:  Recent Labs  09/01/14 0500  09/02/14 0312 09/02/14 1211 09/03/14 0248 09/03/14 0249 09/03/14 1230  HGB 9.7*  --  9.1*  --  8.6*  --   --   HCT 28.7*  --  27.3*  --  25.9*  --   --   PLT 272  --  242  --  264  --   --   HEPARINUNFRC 0.27*  < > 0.29* 0.43  --  0.25* 0.36  CREATININE 0.52  --  0.46*  --  0.57  --   --   < > = values in this interval not displayed.  Estimated Creatinine Clearance: 70.6 mL/min (by C-G formula based on Cr of 0.57).    Assessment: 68 yo F on Xarelto PTA for hx afib.  Xarelto was held in anticipation of surgery (last dose 4 days prior to surgery).    Heparin level therapeutic this afternoon.  Goal of Therapy:  Heparin level 0.3-0.7 units/ml Monitor platelets by anticoagulation protocol: Yes   Plan:  Heparin drip to 1500 units / hr Next heparin level in AM Follow up plans for restarting oral anticoagulation.  Thank you Anette Guarneri, PharmD [phone] 09/03/2014 1:33 PM

## 2014-09-03 NOTE — Progress Notes (Signed)
SUBJECTIVE: The patient is doing reasonably well today.  She has ongoing nausea and dypsepsia.  She has converted to sinus rhythm with IV amiodarone.  At this time, she denies chest pain, shortness of breath, or any new concerns.  Marland Kitchen antiseptic oral rinse  7 mL Mouth Rinse q12n4p  . chlorhexidine  15 mL Mouth Rinse BID  . fluticasone  1 spray Each Nare BID  . HYDROmorphone PCA 0.3 mg/mL   Intravenous 6 times per day  . insulin aspart  0-15 Units Subcutaneous 6 times per day  . insulin glargine  5 Units Subcutaneous Daily  . ketorolac  15 mg Intravenous 4 times per day  . losartan  50 mg Oral q morning - 10a  . metoprolol  5 mg Intravenous 4 times per day  . pantoprazole (PROTONIX) IV  40 mg Intravenous Q12H   . sodium chloride 75 mL/hr at 09/03/14 0900  . amiodarone 30 mg/hr (09/03/14 0900)  . bupivacaine 0.25 % ON-Q pump DUAL CATH 300 mL    . heparin 1,400 Units/hr (09/03/14 0900)    OBJECTIVE: Physical Exam: Filed Vitals:   09/03/14 0600 09/03/14 0700 09/03/14 0800 09/03/14 0900  BP: 133/72 145/63 114/64 143/63  Pulse: 66 62 60 61  Temp:  98 F (36.7 C)    TempSrc:  Oral    Resp: 20 18 20 19   Height:      Weight:      SpO2: 94% 96% 93% 95%    Intake/Output Summary (Last 24 hours) at 09/03/14 1204 Last data filed at 09/03/14 0900  Gross per 24 hour  Intake 2603.88 ml  Output   2540 ml  Net  63.88 ml    Telemetry reveals atrial fibrillation/ atrial flutter with appropriate V rates, now back in sinus rhythm  GEN- The patient is ill appearing, alert and oriented x 3 today.   Head- normocephalic, atraumatic Eyes-  Sclera clear, conjunctiva pink Ears- hearing intact Oropharynx- clear Neck- supple  Lungs- Clear to ausculation bilaterally, normal work of breathing Heart- Regular rate and rhythm  GI- soft, diffusely ttp, no rebound/ guarding Extremities- no clubbing, cyanosis, or edema Skin- no rash or lesion Psych- euthymic mood, full affect Neuro- strength and  sensation are intact  LABS: Basic Metabolic Panel:  Recent Labs  09/02/14 0312 09/03/14 0248  NA 133* 135*  K 4.2 3.6*  CL 97 98  CO2 21 23  GLUCOSE 133* 123*  BUN 6 9  CREATININE 0.46* 0.57  CALCIUM 8.4 8.4   Liver Function Tests:  Recent Labs  09/02/14 0312 09/03/14 0248  AST 58* 50*  ALT 60* 47*  ALKPHOS 81 69  BILITOT 0.4 0.3  PROT 5.7* 5.6*  ALBUMIN 2.5* 2.4*   No results for input(s): LIPASE, AMYLASE in the last 72 hours. CBC:  Recent Labs  09/02/14 0312 09/03/14 0248  WBC 11.3* 10.1  HGB 9.1* 8.6*  HCT 27.3* 25.9*  MCV 85.8 85.8  PLT 242 264   ASSESSMENT AND PLAN:  Active Problems:   Adenocarcinoma of head of pancreas  1. 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 Afib/ atrial flutter Now back in sinus on IV amioadrone Would continue IV amiodarone until taking better POs.  I worry that oral amiodarone could upset her stomach.  May need PICC for amiodarone if she will be on IV for a prolonged period. I hope to eventually get her back on flecainide once clinically more stable She is off of xarelto presently.  Once taking POs could stop IV heparin and return to xarelto.  Cardiology to follow Please call with questions    [facility] Grayer, MD 09/03/2014 12:04 PM

## 2014-09-03 NOTE — Progress Notes (Signed)
[facility] Progress Note 4 Days Post-Op  Subjective: Patient complaining of lower abdominal discomfort and reflux of foul tasting fluid.  No vomiting.    Objective: Vital signs in last 24 hours: Temp:  [98 F (36.7 C)-98.3 F (36.8 C)] 98 F (36.7 C) (12/12 0700) Pulse Rate:  [60-141] 61 (12/12 0900) Resp:  [14-26] 19 (12/12 0900) BP: (104-159)/(61-108) 143/63 mmHg (12/12 0900) SpO2:  [91 %-98 %] 95 % (12/12 0900)    Intake/Output from previous day: 12/11 0701 - 12/12 0700 In: 2832.5 [P.O.:100; I.V.:2732.5] Out: 2700 [Urine:2110; Drains:590] Intake/Output this shift: Total I/O In: 211.4 [I.V.:211.4] Out: 135 [Drains:135]  General: No acute distress.  Actually looks very comfortable sitting up in the chair.  Lungs: Clear  Abd: Good bowel sounds.  Had some flatus this AM  Extremities: No clinical signs or symptoms of DVT  Neuro: Intact   Lab Results:  @LABLAST2 (wbc:2,hgb:2,hct:2,plt:2) BMET  Recent Labs  09/02/14 0312 09/03/14 0248  NA 133* 135*  K 4.2 3.6*  CL 97 98  CO2 21 23  GLUCOSE 133* 123*  BUN 6 9  CREATININE 0.46* 0.57  CALCIUM 8.4 8.4   PT/INR No results for input(s): LABPROT, INR in the last 72 hours. ABG No results for input(s): PHART, HCO3 in the last 72 hours.  Invalid input(s): PCO2, PO2  Studies/Results: Dg Abd Portable 1v  09/02/2014   CLINICAL DATA:  Progressive abdominal pain. Whipple procedure 3 days ago.  EXAM: PORTABLE ABDOMEN - 1 VIEW  COMPARISON:  CT of the abdomen pelvis 07/14/2014.  FINDINGS: Surgical drains are in place. The transverse colon somewhat low lying. There is relatively little gas over the upper abdomen. This may represent fluid or inflammation in the upper abdomen. Laparotomy is noted.  IMPRESSION: Low positioning of the transverse colon may represent mass effect following surgery. Fluid or inflammation is not excluded. CT of the abdomen pelvis may be useful for further evaluation if clinically indicated.    Electronically Signed   By: Lawrence Santiago M.D.   On: 09/02/2014 13:55    Anti-infectives: Anti-infectives    Start     Dose/Rate Route Frequency Ordered Stop   08/30/14 1530  cefOXitin (MEFOXIN) 1 g in dextrose 5 % 50 mL IVPB     1 g100 mL/hr over 30 Minutes Intravenous Every 6 hours 08/30/14 1453 08/31/14 0415   08/30/14 0730  ceFAZolin (ANCEF) IVPB 2 g/50 mL premix  Status:  Discontinued     2 g100 mL/hr over 30 Minutes Intravenous 3 times per day 08/30/14 0716 08/30/14 1453   08/30/14 0600  cefOXitin (MEFOXIN) 2 g in dextrose 5 % 50 mL IVPB     2 g100 mL/hr over 30 Minutes Intravenous On call to O.R. 08/29/14 1405 08/30/14 0840      Assessment/Plan: s/p Procedure(s): WHIPPLE PROCEDURE LAPAROSCOPY DIAGNOSTIC Advance diet  Sips of clear liquids only. Drain output as expected.  LOS: 4 days   Kathryne Eriksson. Dahlia Bailiff, MD, FACS [phone] [facility] 09/03/2014

## 2014-09-03 NOTE — Progress Notes (Signed)
ANTICOAGULATION CONSULT NOTE - Follow Up Consult  Pharmacy Consult for Heparin Indication: atrial fibrillation  Allergies  Allergen Reactions  . Codeine Other (See Comments)    Stomach pain  . Morphine And Related Other (See Comments)    Severe headache  . Sulfa Antibiotics Hives    Patient Measurements: Height: 5\' 3"  (160 cm) Weight: 187 lb 13.3 oz (85.2 kg) IBW/kg (Calculated) : 52.4  Vital Signs: Temp: 98.1 F (36.7 C) (12/12 0000) Temp Source: Oral (12/12 0000) BP: 141/77 mmHg (12/12 0200) Pulse Rate: 91 (12/12 0200)  Labs:  Recent Labs  09/01/14 0500  09/02/14 0312 09/02/14 1211 09/03/14 0248 09/03/14 0249  HGB 9.7*  --  9.1*  --  8.6*  --   HCT 28.7*  --  27.3*  --  25.9*  --   PLT 272  --  242  --  264  --   HEPARINUNFRC 0.27*  < > 0.29* 0.43  --  0.25*  CREATININE 0.52  --  0.46*  --  0.57  --   < > = values in this interval not displayed.  Estimated Creatinine Clearance: 70.6 mL/min (by C-G formula based on Cr of 0.57).   Assessment: Slightly sub-therapeutic heparin level, other labs as above, no issues per RN.   Goal of Therapy:  Heparin level 0.3-0.7 units/ml Monitor platelets by anticoagulation protocol: Yes   Plan:  -Increase heparin to 1400 units/hr -1200 HL -Daily CBC/HL -Monitor for bleeding  Narda Bonds 09/03/2014,4:23 AM

## 2014-09-04 LAB — COMPREHENSIVE METABOLIC PANEL
ALK PHOS: 65 U/L (ref 39–117)
ALT: 40 U/L — ABNORMAL HIGH (ref 0–35)
ANION GAP: 14 (ref 5–15)
AST: 39 U/L — ABNORMAL HIGH (ref 0–37)
Albumin: 2.3 g/dL — ABNORMAL LOW (ref 3.5–5.2)
BUN: 13 mg/dL (ref 6–23)
CALCIUM: 8.2 mg/dL — AB (ref 8.4–10.5)
CO2: 22 mEq/L (ref 19–32)
CREATININE: 0.57 mg/dL (ref 0.50–1.10)
Chloride: 99 mEq/L (ref 96–112)
GFR calc non Af Amer: >90 mL/min (ref >90)
GLUCOSE: 86 mg/dL (ref 70–99)
POTASSIUM: 3.4 meq/L — AB (ref 3.7–5.3)
Sodium: 135 mEq/L — ABNORMAL LOW (ref 137–147)
TOTAL PROTEIN: 5.2 g/dL — AB (ref 6.0–8.3)
Total Bilirubin: 0.3 mg/dL (ref 0.3–1.2)

## 2014-09-04 LAB — GLUCOSE, CAPILLARY
GLUCOSE-CAPILLARY: 81 mg/dL (ref 70–99)
GLUCOSE-CAPILLARY: 68 mg/dL — AB (ref 70–99)
Glucose-Capillary: 72 mg/dL (ref 70–99)
Glucose-Capillary: 75 mg/dL (ref 70–99)
Glucose-Capillary: 88 mg/dL (ref 70–99)
Glucose-Capillary: 99 mg/dL (ref 70–99)

## 2014-09-04 LAB — CBC
HEMATOCRIT: 25 % — AB (ref 36.0–46.0)
Hemoglobin: 8.4 g/dL — ABNORMAL LOW (ref 12.0–15.0)
MCH: 29 pg (ref 26.0–34.0)
MCHC: 33.6 g/dL (ref 30.0–36.0)
MCV: 86.2 fL (ref 78.0–100.0)
Platelets: 260 10*3/uL (ref 150–400)
RBC: 2.9 MIL/uL — ABNORMAL LOW (ref 3.87–5.11)
RDW: 13.7 % (ref 11.5–15.5)
WBC: 9.4 10*3/uL (ref 4.0–10.5)

## 2014-09-04 LAB — HEPARIN LEVEL (UNFRACTIONATED): HEPARIN UNFRACTIONATED: 0.36 [IU]/mL (ref 0.30–0.70)

## 2014-09-04 MED ORDER — PANTOPRAZOLE SODIUM 40 MG IV SOLR
40.0000 mg | Freq: Two times a day (BID) | INTRAVENOUS | Status: DC
  Administered 2014-09-04 – 2014-09-08 (×8): 40 mg via INTRAVENOUS
  Filled 2014-09-04 (×13): qty 40

## 2014-09-04 MED ORDER — DEXTROSE 50 % IV SOLN
INTRAVENOUS | Status: AC
  Filled 2014-09-04: qty 50

## 2014-09-04 MED ORDER — DEXTROSE 50 % IV SOLN
1.0000 | Freq: Once | INTRAVENOUS | Status: AC
  Administered 2014-09-04: 50 mL via INTRAVENOUS
  Filled 2014-09-04: qty 50

## 2014-09-04 MED ORDER — SODIUM CHLORIDE 0.9 % IJ SOLN
10.0000 mL | INTRAMUSCULAR | Status: DC | PRN
  Administered 2014-09-09 (×5): 10 mL
  Filled 2014-09-04 (×5): qty 40

## 2014-09-04 MED ORDER — SODIUM CHLORIDE 0.9 % IJ SOLN
10.0000 mL | Freq: Two times a day (BID) | INTRAMUSCULAR | Status: DC
  Administered 2014-09-04 – 2014-09-05 (×2): 30 mL
  Administered 2014-09-05 – 2014-09-06 (×2): 20 mL
  Administered 2014-09-06: 10 mL
  Administered 2014-09-07: 20 mL
  Administered 2014-09-07 – 2014-09-09 (×3): 10 mL

## 2014-09-04 NOTE — Progress Notes (Signed)
In to see patient at this time, patient currently covered in emesis. Noted to be brown in color with smell and appearance of fecal matter. 600cc into basin noted. MD Wyatt notified at this time, place NG tube per order. Status post NG tube placement another 1500cc immediately returned. MD Wyatt notified of output and PICC line ordered. Patient VS WNL, and no respiratory distress noted at this time. Zofran 4mg  IVP given at this time, will continue to monitor the patient closely.

## 2014-09-04 NOTE — Progress Notes (Signed)
Hypoglycemic Event  CBG: 68  Treatment: D50 IV 25 mL  Symptoms: None  Follow-up CBG: Time:2015 CBG Result:112  Possible Reasons for Event: Inadequate meal intake/NPO      [name] L  Remember to initiate Hypoglycemia Order Set & complete

## 2014-09-04 NOTE — Progress Notes (Signed)
Peripherally Inserted Central Catheter/Midline Placement  The IV Nurse has discussed with the patient and/or persons authorized to consent for the patient, the purpose of this procedure and the potential benefits and risks involved with this procedure.  The benefits include less needle sticks, lab draws from the catheter and patient may be discharged home with the catheter.  Risks include, but not limited to, infection, bleeding, blood clot (thrombus formation), and puncture of an artery; nerve damage and irregular heat beat.  Alternatives to this procedure were also discussed.  PICC/Midline Placement Documentation  PICC Triple Lumen 09/04/14 PICC Right 42 cm 0 cm (Active)  Indication for Insertion or Continuance of Line Vasoactive infusions 09/04/2014 12:06 PM  Exposed Catheter (cm) 0 cm 09/04/2014 12:06 PM  Site Assessment Clean;Dry;Intact 09/04/2014 12:06 PM  Lumen #1 Status Flushed;Saline locked;Blood return noted 09/04/2014 12:06 PM  Lumen #2 Status Flushed;Saline locked;Blood return noted 09/04/2014 12:06 PM  Lumen #3 Status Flushed;Saline locked;Blood return noted 09/04/2014 12:06 PM  Dressing Type Transparent 09/04/2014 12:06 PM  Dressing Change Due 09/11/14 09/04/2014 12:06 PM       Gordan Payment 09/04/2014, 12:08 PM

## 2014-09-04 NOTE — Progress Notes (Signed)
5 Days Post-Op  Subjective: Pt doing well after NGT. Some blood from NGT. +BM overnight  Objective: Vital signs in last 24 hours: Temp:  [98.3 F (36.8 C)-98.9 F (37.2 C)] 98.4 F (36.9 C) (12/13 0800) Pulse Rate:  [62-81] 71 (12/13 1000) Resp:  [15-27] 18 (12/13 1000) BP: (110-151)/(31-85) 136/70 mmHg (12/13 1000) SpO2:  [94 %-100 %] 96 % (12/13 1000) Weight:  [186 lb 11.7 oz (84.7 kg)] 186 lb 11.7 oz (84.7 kg) (12/13 0700) Last BM Date: 09/03/14  Intake/Output from previous day: 12/12 0701 - 12/13 0700 In: 2353.9 [I.V.:2353.9] Out: 2865 [Urine:400; Emesis/NG output:2000; Drains:465]  JP-265/200 cc -SS Intake/Output this shift: Total I/O In: 198.4 [I.V.:198.4] Out: 375 [Urine:75; Emesis/NG output:225; Drains:75]  General appearance: alert and cooperative GI: soft, JPs-SS, no ttp, no guarding  Lab Results:   Recent Labs  09/03/14 0248 09/04/14 0239  WBC 10.1 9.4  HGB 8.6* 8.4*  HCT 25.9* 25.0*  PLT 264 260   BMET  Recent Labs  09/03/14 0248 09/04/14 0239  NA 135* 135*  K 3.6* 3.4*  CL 98 99  CO2 23 22  GLUCOSE 123* 86  BUN 9 13  CREATININE 0.57 0.57  CALCIUM 8.4 8.2*    Studies/Results: Dg Abd Portable 1v  09/02/2014   CLINICAL DATA:  Progressive abdominal pain. Whipple procedure 3 days ago.  EXAM: PORTABLE ABDOMEN - 1 VIEW  COMPARISON:  CT of the abdomen pelvis 07/14/2014.  FINDINGS: Surgical drains are in place. The transverse colon somewhat low lying. There is relatively little gas over the upper abdomen. This may represent fluid or inflammation in the upper abdomen. Laparotomy is noted.  IMPRESSION: Low positioning of the transverse colon may represent mass effect following surgery. Fluid or inflammation is not excluded. CT of the abdomen pelvis may be useful for further evaluation if clinically indicated.   Electronically Signed   By: Lawrence Santiago M.D.   On: 09/02/2014 13:55    Anti-infectives: Anti-infectives    Start     Dose/Rate Route  Frequency Ordered Stop   08/30/14 1530  cefOXitin (MEFOXIN) 1 g in dextrose 5 % 50 mL IVPB     1 g100 mL/hr over 30 Minutes Intravenous Every 6 hours 08/30/14 1453 08/31/14 0415   08/30/14 0730  ceFAZolin (ANCEF) IVPB 2 g/50 mL premix  Status:  Discontinued     2 g100 mL/hr over 30 Minutes Intravenous 3 times per day 08/30/14 0716 08/30/14 1453   08/30/14 0600  cefOXitin (MEFOXIN) 2 g in dextrose 5 % 50 mL IVPB     2 g100 mL/hr over 30 Minutes Intravenous On call to O.R. 08/29/14 1405 08/30/14 0840      Assessment/Plan: s/p Procedure(s): WHIPPLE PROCEDURE (N/A) LAPAROSCOPY DIAGNOSTIC (N/A) incerase protonix to BID  Hold heparin PICC placement mobilize  LOS: 5 days    Rosario Jacks., [facility] 09/04/2014

## 2014-09-04 NOTE — Progress Notes (Signed)
IV team consult order placed to check PICC line due to nurse not being able to flush line. Upon arrival nurse stated that line is working and IVT is not needed

## 2014-09-04 NOTE — Progress Notes (Signed)
   SUBJECTIVE: The patient is doing reasonably well today.  She continues to have slow GI recovery.  Heme was noted from her NG and heparin has been discontinued.  At this time, she denies chest pain, shortness of breath, or any new concerns.  Marland Kitchen antiseptic oral rinse  7 mL Mouth Rinse q12n4p  . chlorhexidine  15 mL Mouth Rinse BID  . fluticasone  1 spray Each Nare BID  . insulin aspart  0-15 Units Subcutaneous 6 times per day  . insulin glargine  5 Units Subcutaneous Daily  . ketorolac  15 mg Intravenous 4 times per day  . metoprolol  5 mg Intravenous 4 times per day  . pantoprazole (PROTONIX) IV  40 mg Intravenous BID   . sodium chloride 75 mL/hr at 09/04/14 0800  . amiodarone 30 mg/hr (09/04/14 1006)  . bupivacaine 0.25 % ON-Q pump DUAL CATH 300 mL    . heparin Stopped (09/04/14 1030)    OBJECTIVE: Physical Exam: Filed Vitals:   09/04/14 0700 09/04/14 0800 09/04/14 0900 09/04/14 1000  BP:  150/69 146/85 136/70  Pulse:   73 71  Temp:  98.4 F (36.9 C)    TempSrc:      Resp:  18 20 18   Height:      Weight: 186 lb 11.7 oz (84.7 kg)     SpO2:  100% 100% 96%    Intake/Output Summary (Last 24 hours) at 09/04/14 1059 Last data filed at 09/04/14 1030  Gross per 24 hour  Intake 2364.35 ml  Output   3105 ml  Net -740.65 ml    Telemetry reveals sinus rhythm today  GEN- The patient is ill appearing, sleeping but rouses Head- normocephalic, atraumatic Eyes-  Sclera clear, conjunctiva pink Ears- hearing intact Oropharynx- clear Neck- supple  Lungs- Clear to ausculation bilaterally, normal work of breathing Heart- Regular rate and rhythm  GI- soft, diffusely ttp, no rebound/ guarding Extremities- no clubbing, cyanosis, or edema Neuro- strength and sensation are intact  LABS: Basic Metabolic Panel:  Recent Labs  09/03/14 0248 09/04/14 0239  NA 135* 135*  K 3.6* 3.4*  CL 98 99  CO2 23 22  GLUCOSE 123* 86  BUN 9 13  CREATININE 0.57 0.57  CALCIUM 8.4 8.2*    Liver Function Tests:  Recent Labs  09/03/14 0248 09/04/14 0239  AST 50* 39*  ALT 47* 40*  ALKPHOS 69 65  BILITOT 0.3 0.3  PROT 5.6* 5.2*  ALBUMIN 2.4* 2.3*   No results for input(s): LIPASE, AMYLASE in the last 72 hours. CBC:  Recent Labs  09/03/14 0248 09/04/14 0239  WBC 10.1 9.4  HGB 8.6* 8.4*  HCT 25.9* 25.0*  MCV 85.8 86.2  PLT 264 260   ASSESSMENT AND PLAN:  Active Problems:   Adenocarcinoma of head of pancreas  1. 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 Afib/ atrial flutter Now back in sinus on IV amioadrone.  No changes for now Heparin is off due to heme in NGT. Will need surgical guidance as to timing for reinitiation of anticoagulation.   Cardiology to follow Please call with questions    [facility] Grayer, MD 09/04/2014 10:59 AM

## 2014-09-05 DIAGNOSIS — Z7901 Long term (current) use of anticoagulants: Secondary | ICD-10-CM

## 2014-09-05 DIAGNOSIS — I4891 Unspecified atrial fibrillation: Secondary | ICD-10-CM | POA: Diagnosis not present

## 2014-09-05 DIAGNOSIS — C250 Malignant neoplasm of head of pancreas: Principal | ICD-10-CM

## 2014-09-05 LAB — GLUCOSE, CAPILLARY
GLUCOSE-CAPILLARY: 112 mg/dL — AB (ref 70–99)
GLUCOSE-CAPILLARY: 75 mg/dL (ref 70–99)
Glucose-Capillary: 64 mg/dL — ABNORMAL LOW (ref 70–99)
Glucose-Capillary: 74 mg/dL (ref 70–99)
Glucose-Capillary: 75 mg/dL (ref 70–99)
Glucose-Capillary: 77 mg/dL (ref 70–99)

## 2014-09-05 MED ORDER — METOPROLOL TARTRATE 1 MG/ML IV SOLN
2.5000 mg | Freq: Four times a day (QID) | INTRAVENOUS | Status: DC
Start: 2014-09-05 — End: 2014-09-07
  Administered 2014-09-05 – 2014-09-07 (×8): 2.5 mg via INTRAVENOUS
  Filled 2014-09-05 (×9): qty 5

## 2014-09-05 MED ORDER — MORPHINE SULFATE 2 MG/ML IJ SOLN: 1.0000 mg | INTRAMUSCULAR | Status: DC | PRN

## 2014-09-05 MED ORDER — DEXTROSE 50 % IV SOLN
INTRAVENOUS | Status: AC
  Filled 2014-09-05: qty 50

## 2014-09-05 MED ORDER — MORPHINE SULFATE 2 MG/ML IJ SOLN
2.0000 mg | INTRAMUSCULAR | Status: DC | PRN
  Filled 2014-09-05: qty 1

## 2014-09-05 MED ORDER — DEXTROSE 50 % IV SOLN
25.0000 mL | Freq: Once | INTRAVENOUS | Status: AC
  Administered 2014-09-05: 25 mL via INTRAVENOUS
  Filled 2014-09-05: qty 50

## 2014-09-05 MED ORDER — POTASSIUM CHLORIDE 10 MEQ/100ML IV SOLN: 10.0000 meq | INTRAVENOUS | Status: DC

## 2014-09-05 MED ORDER — POTASSIUM CHLORIDE 10 MEQ/50ML IV SOLN
10.0000 meq | INTRAVENOUS | Status: AC
  Administered 2014-09-05 (×6): 10 meq via INTRAVENOUS
  Filled 2014-09-05 (×4): qty 50

## 2014-09-05 MED ORDER — MORPHINE SULFATE 2 MG/ML IJ SOLN
1.0000 mg | INTRAMUSCULAR | Status: DC | PRN
  Administered 2014-09-05: 2 mg via INTRAVENOUS
  Administered 2014-09-05: 1 mg via INTRAVENOUS
  Administered 2014-09-06 – 2014-09-09 (×10): 2 mg via INTRAVENOUS
  Filled 2014-09-05 (×12): qty 1

## 2014-09-05 MED ORDER — METOPROLOL TARTRATE 1 MG/ML IV SOLN: 2.5000 mg | Freq: Four times a day (QID) | INTRAVENOUS | Status: DC

## 2014-09-05 NOTE — Progress Notes (Signed)
6 Days Post-Op  Subjective: No n/v.  Still with some belching, but 2 very small BMs today.    Objective: Vital signs in last 24 hours: Temp:  [97.4 F (36.3 C)-98.7 F (37.1 C)] 98.1 F (36.7 C) (12/14 1928) Pulse Rate:  [59-81] 71 (12/14 2200) Resp:  [15-31] 25 (12/14 2200) BP: (102-143)/(59-110) 134/62 mmHg (12/14 2200) SpO2:  [92 %-100 %] 100 % (12/14 2200) Weight:  [92.9 kg (204 lb 12.9 oz)] 92.9 kg (204 lb 12.9 oz) (12/14 0600) Last BM Date: 09/05/14  Intake/Output from previous day: 12/13 0701 - 12/14 0700 In: 2296.6 [I.V.:2296.6] Out: 1785 [Urine:750; Emesis/NG output:750; Drains:285]  JP-265/200 cc -SS Intake/Output this shift: Total I/O In: 367.6 [P.O.:60; I.V.:257.6; IV Piggyback:50] Out: 0   General appearance: alert and cooperative GI: soft, JPs-SS, no ttp, no guarding  Lab Results:   Recent Labs  09/03/14 0248 09/04/14 0239  WBC 10.1 9.4  HGB 8.6* 8.4*  HCT 25.9* 25.0*  PLT 264 260   BMET  Recent Labs  09/03/14 0248 09/04/14 0239  NA 135* 135*  K 3.6* 3.4*  CL 98 99  CO2 23 22  GLUCOSE 123* 86  BUN 9 13  CREATININE 0.57 0.57  CALCIUM 8.4 8.2*    Studies/Results: No results found.  Anti-infectives: Anti-infectives    Start     Dose/Rate Route Frequency Ordered Stop   08/30/14 1530  cefOXitin (MEFOXIN) 1 g in dextrose 5 % 50 mL IVPB     1 g100 mL/hr over 30 Minutes Intravenous Every 6 hours 08/30/14 1453 08/31/14 0415   08/30/14 0730  ceFAZolin (ANCEF) IVPB 2 g/50 mL premix  Status:  Discontinued     2 g100 mL/hr over 30 Minutes Intravenous 3 times per day 08/30/14 0716 08/30/14 1453   08/30/14 0600  cefOXitin (MEFOXIN) 2 g in dextrose 5 % 50 mL IVPB     2 g100 mL/hr over 30 Minutes Intravenous On call to O.R. 08/29/14 1405 08/30/14 0840      Assessment/Plan: s/p Procedure(s): WHIPPLE PROCEDURE (N/A) LAPAROSCOPY DIAGNOSTIC (N/A) BID protonix Amiodarone for atrial fibrillation per cardiology Hold heparin Clamp NGT today and see  if no n/v.  Will allow sips.   mobilize  LOS: 6 days    [facility] 09/05/2014

## 2014-09-05 NOTE — Progress Notes (Signed)
Inpatient Diabetes Program Recommendations  AACE/ADA: New Consensus Statement on Inpatient Glycemic Control (2013)  Target Ranges:  Prepandial:   less than 140 mg/dL      Peak postprandial:   less than 180 mg/dL (1-2 hours)      Critically ill patients:  140 - 180 mg/dL   Reason for Assessment:  Results for [name], [name] (MRN [id number]) as of 09/05/2014 11:31  Ref. Range 09/04/2014 19:36 09/04/2014 20:25 09/04/2014 23:38 09/05/2014 03:33 09/05/2014 07:56  Glucose-Capillary Latest Range: 70-99 mg/dL 68 (L) 112 (H) 75 75 77    Note that CBG's trending down.  Consider d/c of Lantus and decrease Novolog correction to sensitive.  Adah Perl, RN, BC-ADM Inpatient Diabetes Coordinator Pager [phone]

## 2014-09-05 NOTE — Progress Notes (Addendum)
Patient began having abdominal discomfort which she described as pressure. So NG tube was unclamped and placed on intermittent low wall suction. 800 ml of dark green fluid was collected. Dr. Donne Hazel was notified and agreed to keep her to low wall suction for the night.

## 2014-09-05 NOTE — Progress Notes (Signed)
    Subjective:  Up in chair, taking clear liquids, still has NG in.  Objective:  Vital Signs in the last 24 hours: Temp:  [97.4 F (36.3 C)-99.1 F (37.3 C)] 98.7 F (37.1 C) (12/14 0758) Pulse Rate:  [59-93] 66 (12/14 0900) Resp:  [15-31] 15 (12/14 0900) BP: (104-143)/(59-81) 132/73 mmHg (12/14 0900) SpO2:  [92 %-100 %] 98 % (12/14 0900) Weight:  [204 lb 12.9 oz (92.9 kg)] 204 lb 12.9 oz (92.9 kg) (12/14 0600)  Intake/Output from previous day:  Intake/Output Summary (Last 24 hours) at 09/05/14 0948 Last data filed at 09/05/14 0900  Gross per 24 hour  Intake 2283.3 ml  Output   1410 ml  Net  873.3 ml    Physical Exam: General appearance: alert, cooperative and no distress Lungs: clear to auscultation bilaterally Heart: regular rate and rhythm   Rate: 64  Rhythm: normal sinus rhythm and sinus bradycardia  Lab Results:  Recent Labs  09/03/14 0248 09/04/14 0239  WBC 10.1 9.4  HGB 8.6* 8.4*  PLT 264 260    Recent Labs  09/03/14 0248 09/04/14 0239  NA 135* 135*  K 3.6* 3.4*  CL 98 99  CO2 23 22  GLUCOSE 123* 86  BUN 9 13  CREATININE 0.57 0.57   No results for input(s): TROPONINI in the last 72 hours.  Invalid input(s): CK, MB No results for input(s): INR in the last 72 hours.  Imaging: Imaging results have been reviewed   Assessment/Plan:  68 yo female w/ hx PAF on Xarelto and Flecainide. Admitted 08/30/14 for Whipple. Post op she developed AF with RVR, now NSR on IV Amiodarone. IV Heparin stopped secondary to heme positive NG aspirate.   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  Restart anticoagulation when ok with surgery.  Kerin Ransom PA-C Beeper [phone] 09/05/2014, 9:48  AM  Patient seen with PA, agree with the above note.  She remains in NSR today on amiodarone gtt.  When she is taking pos, we can stop amiodarone and put her back on her home flecainide dose.    Patient has had some blood in NGT.  None this morning. Hemoglobin stable.  She is off heparin gtt now.  Would stop toradol and use alternative agent for pain control.  Resume Xarelto when stable from standpoint of bleeding and when ok with surgery.   [name] 09/05/2014 10:52 AM
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
## 2014-09-06 LAB — BASIC METABOLIC PANEL
Anion gap: 18 — ABNORMAL HIGH (ref 5–15)
BUN: 8 mg/dL (ref 6–23)
CO2: 18 meq/L — AB (ref 19–32)
CREATININE: 0.49 mg/dL — AB (ref 0.50–1.10)
Calcium: 8.1 mg/dL — ABNORMAL LOW (ref 8.4–10.5)
Chloride: 101 mEq/L (ref 96–112)
GFR calc Af Amer: >90 mL/min (ref >90)
GFR calc non Af Amer: >90 mL/min (ref >90)
GLUCOSE: 76 mg/dL (ref 70–99)
Potassium: 3.3 mEq/L — ABNORMAL LOW (ref 3.7–5.3)
Sodium: 137 mEq/L (ref 137–147)

## 2014-09-06 LAB — GLUCOSE, CAPILLARY
GLUCOSE-CAPILLARY: 67 mg/dL — AB (ref 70–99)
GLUCOSE-CAPILLARY: 83 mg/dL (ref 70–99)
Glucose-Capillary: 77 mg/dL (ref 70–99)
Glucose-Capillary: 74 mg/dL (ref 70–99)
Glucose-Capillary: 87 mg/dL (ref 70–99)
Glucose-Capillary: 74 mg/dL (ref 70–99)

## 2014-09-06 LAB — CBC
HEMATOCRIT: 24.7 % — AB (ref 36.0–46.0)
Hemoglobin: 8.4 g/dL — ABNORMAL LOW (ref 12.0–15.0)
MCH: 29 pg (ref 26.0–34.0)
MCHC: 34 g/dL (ref 30.0–36.0)
MCV: 85.2 fL (ref 78.0–100.0)
Platelets: 292 10*3/uL (ref 150–400)
RBC: 2.9 MIL/uL — ABNORMAL LOW (ref 3.87–5.11)
RDW: 14.6 % (ref 11.5–15.5)
WBC: 8.8 10*3/uL (ref 4.0–10.5)

## 2014-09-06 LAB — HEPARIN LEVEL (UNFRACTIONATED): Heparin Unfractionated: <0.1 IU/mL — ABNORMAL LOW (ref 0.30–0.70)

## 2014-09-06 MED ORDER — PNEUMOCOCCAL VAC POLYVALENT 25 MCG/0.5ML IJ INJ
0.5000 mL | INJECTION | INTRAMUSCULAR | Status: DC
  Filled 2014-09-06: qty 0.5

## 2014-09-06 MED ORDER — POTASSIUM CHLORIDE 10 MEQ/50ML IV SOLN
10.0000 meq | INTRAVENOUS | Status: AC
  Administered 2014-09-06 (×6): 10 meq via INTRAVENOUS
  Filled 2014-09-06 (×6): qty 50

## 2014-09-06 MED ORDER — HEPARIN (PORCINE) IN NACL 100-0.45 UNIT/ML-% IJ SOLN
1450.0000 [IU]/h | INTRAMUSCULAR | Status: DC
  Administered 2014-09-06: 1250 [IU]/h via INTRAVENOUS
  Filled 2014-09-06 (×2): qty 250

## 2014-09-06 NOTE — Progress Notes (Signed)
7 Days Post-Op  Subjective: No n/v today, but pt got nauseated last night.  Her NGT was hooked back up to suction and had 800 mL come out.  She has been walking a lot.    Objective: Vital signs in last 24 hours: Temp:  [97.4 F (36.3 C)-98.3 F (36.8 C)] 97.4 F (36.3 C) (12/15 1100) Pulse Rate:  [63-81] 64 (12/15 0700) Resp:  [19-26] 21 (12/15 0700) BP: (102-147)/(60-110) 128/65 mmHg (12/15 0700) SpO2:  [95 %-100 %] 97 % (12/15 0700) Last BM Date: 09/05/14  Intake/Output from previous day: 12/14 0701 - 12/15 0700 In: 2510 [P.O.:60; I.V.:2200; IV Piggyback:250] Out: 1060 [Emesis/NG output:800; Drains:260]  JP-265/200 cc -SS Intake/Output this shift:    General appearance: alert and cooperative GI: soft, JPs-SS, no ttp, no guarding  Lab Results:   Recent Labs  09/04/14 0239 09/06/14 0905  WBC 9.4 8.8  HGB 8.4* 8.4*  HCT 25.0* 24.7*  PLT 260 292   BMET  Recent Labs  09/04/14 0239 09/06/14 0905  NA 135* 137  K 3.4* 3.3*  CL 99 101  CO2 22 18*  GLUCOSE 86 76  BUN 13 8  CREATININE 0.57 0.49*  CALCIUM 8.2* 8.1*    Studies/Results: No results found.  Anti-infectives: Anti-infectives    Start     Dose/Rate Route Frequency Ordered Stop   08/30/14 1530  cefOXitin (MEFOXIN) 1 g in dextrose 5 % 50 mL IVPB     1 g100 mL/hr over 30 Minutes Intravenous Every 6 hours 08/30/14 1453 08/31/14 0415   08/30/14 0730  ceFAZolin (ANCEF) IVPB 2 g/50 mL premix  Status:  Discontinued     2 g100 mL/hr over 30 Minutes Intravenous 3 times per day 08/30/14 0716 08/30/14 1453   08/30/14 0600  cefOXitin (MEFOXIN) 2 g in dextrose 5 % 50 mL IVPB     2 g100 mL/hr over 30 Minutes Intravenous On call to O.R. 08/29/14 1405 08/30/14 0840      Assessment/Plan: s/p Procedure(s): WHIPPLE PROCEDURE (N/A) LAPAROSCOPY DIAGNOSTIC (N/A) BID protonix Amiodarone for atrial fibrillation per cardiology, switch to flecainide when back on POs.   Restart heparin Keep NGT 1 more day.   If no  stepdown bed, may be able to go straight to floor tomorrow.    mobilize  LOS: 7 days    [facility] 09/06/2014

## 2014-09-06 NOTE — Progress Notes (Signed)
Inpatient Diabetes Program Recommendations  AACE/ADA: New Consensus Statement on Inpatient Glycemic Control (2013)  Target Ranges:  Prepandial:   less than 140 mg/dL      Peak postprandial:   less than 180 mg/dL (1-2 hours)      Critically ill patients:  140 - 180 mg/dL   Results for FAREN, FLORENCE (MRN [id number]) as of 09/06/2014 13:40  Ref. Range 09/05/2014 15:43 09/05/2014 19:14 09/06/2014 03:45 09/06/2014 08:23 09/06/2014 11:35  Glucose-Capillary Latest Range: 70-99 mg/dL 64 (L) 74 77 74 87    Note that CBG's remain low. Consider d/c of Lantus and decrease Novolog correction to sensitive.  Gentry Fitz, RN, BA, MHA, CDE Diabetes Coordinator Inpatient Diabetes Program  [phone] (Team Pager) [phone] [facility]) 09/06/2014 1:42 PM

## 2014-09-06 NOTE — Progress Notes (Signed)
ANTICOAGULATION CONSULT NOTE - Follow Up Consult  Pharmacy Consult for heparin Indication: atrial fibrillation  Allergies  Allergen Reactions  . Codeine Other (See Comments)    Stomach pain  . Morphine And Related Other (See Comments)    Severe headache. Pt has not had since 90s and is ok with taking morphine.  . Sulfa Antibiotics Hives    Patient Measurements: Height: 5\' 3"  (160 cm) Weight: 204 lb 12.9 oz (92.9 kg) IBW/kg (Calculated) : 52.4 Heparin Dosing Weight:  73kg  Vital Signs: Temp: 97.4 F (36.3 C) (12/15 1100) Temp Source: Oral (12/15 1100) BP: 126/64 mmHg (12/15 1000) Pulse Rate: 65 (12/15 1000)  Labs:  Recent Labs  09/04/14 0239 09/06/14 0905  HGB 8.4* 8.4*  HCT 25.0* 24.7*  PLT 260 292  HEPARINUNFRC 0.36  --   CREATININE 0.57 0.49*    Estimated Creatinine Clearance: 73.9 mL/min (by C-G formula based on Cr of 0.49).   Medications:  Scheduled:  . antiseptic oral rinse  7 mL Mouth Rinse q12n4p  . chlorhexidine  15 mL Mouth Rinse BID  . fluticasone  1 spray Each Nare BID  . insulin aspart  0-15 Units Subcutaneous 6 times per day  . insulin glargine  5 Units Subcutaneous Daily  . metoprolol  2.5 mg Intravenous 4 times per day  . pantoprazole (PROTONIX) IV  40 mg Intravenous BID  . potassium chloride  10 mEq Intravenous Q1 Hr x 6  . sodium chloride  10-40 mL Intracatheter Q12H    Assessment: 68 yo female s/p Whipple procedure (08/30/14) for pancreatic cancer. She was on Xarelto at home and has been on heparin post-op. Blood was noted from the NGT on 12/13 and heparin was stopped but this has now resolved. Pharmacy has been consulted to re-start heparin (with no bolus) until taking po.  The last heparin rate was 1500 units/hr with heparin level of 0.36 on 09/04/14. Hg/Hct= 8.4/24.7 and plt= 292  Goal of Therapy:  Heparin level 0.3-0.7 units/ml Monitor platelets by anticoagulation protocol: Yes   Plan:   -No heparin bolus -With recent bleeding will  re-start heparin at 1250 units/hr and titrate up -Heparin level in 6 hours  Hildred Laser, Pharm D 09/06/2014 1:36 PM

## 2014-09-06 NOTE — Progress Notes (Addendum)
Pt arrived to [facility]. Comfortable in bed. NG tube clamped. Vitals signs stable. Family visiting. Will continue to monitor.

## 2014-09-06 NOTE — Progress Notes (Signed)
Utilization Review Completed.  

## 2014-09-06 NOTE — Progress Notes (Signed)
ANTICOAGULATION CONSULT NOTE - Follow Up Consult  Pharmacy Consult for heparin Indication: atrial fibrillation  Allergies  Allergen Reactions  . Codeine Other (See Comments)    Stomach pain  . Morphine And Related Other (See Comments)    Severe headache. Pt has not had since 90s and is ok with taking morphine.  . Sulfa Antibiotics Hives    Patient Measurements: Height: 5\' 3"  (160 cm) Weight: 204 lb 12.9 oz (92.9 kg) IBW/kg (Calculated) : 52.4 Heparin Dosing Weight:  73kg  Vital Signs: Temp: 98 F (36.7 C) (12/15 1904) Temp Source: Oral (12/15 1904) BP: 144/77 mmHg (12/15 1904) Pulse Rate: 75 (12/15 1904)  Labs:  Recent Labs  09/04/14 0239 09/06/14 0905 09/06/14 2045  HGB 8.4* 8.4*  --   HCT 25.0* 24.7*  --   PLT 260 292  --   HEPARINUNFRC 0.36  --  <0.10*  CREATININE 0.57 0.49*  --     Estimated Creatinine Clearance: 73.9 mL/min (by C-G formula based on Cr of 0.49).   Medications:  Scheduled:  . antiseptic oral rinse  7 mL Mouth Rinse q12n4p  . chlorhexidine  15 mL Mouth Rinse BID  . fluticasone  1 spray Each Nare BID  . insulin aspart  0-15 Units Subcutaneous 6 times per day  . insulin glargine  5 Units Subcutaneous Daily  . metoprolol  2.5 mg Intravenous 4 times per day  . pantoprazole (PROTONIX) IV  40 mg Intravenous BID  . [START ON 09/07/2014] pneumococcal 23 valent vaccine  0.5 mL Intramuscular Tomorrow-1000  . sodium chloride  10-40 mL Intracatheter Q12H    Assessment: 68 yo female s/p Whipple procedure (08/30/14) for pancreatic cancer. She was on Xarelto at home and has been on heparin post-op. Blood was noted from the NGT on 12/13 and heparin was stopped but this has now resolved. Heparin was restarted at low rate 1250 uts/hr (with no bolus) with 6hr HL < 0.1 - less than goal. The last heparin rate was 1500 units/hr with heparin level of 0.36 on 09/04/14. Hg/Hct= 8.4/24.7 and plt= 292  Goal of Therapy:  Heparin level 0.3-0.7 units/ml Monitor  platelets by anticoagulation protocol: Yes   Plan:   Increase heparin drip rate 1450 uts/hr  Daily HL, CBC  Bonnita Nasuti Pharm.D. CPP, BCPS Clinical Pharmacist [phone] 09/06/2014 9:43 PM

## 2014-09-06 NOTE — Progress Notes (Signed)
Patient ID: [name], female   DOB: 03-18-1946, 68 y.o.   MRN: [id number]   SUBJECTIVE: Had BM this morning.  Still NPO with NG tube.  No further bleeding from NG tube.   Scheduled Meds: . antiseptic oral rinse  7 mL Mouth Rinse q12n4p  . chlorhexidine  15 mL Mouth Rinse BID  . fluticasone  1 spray Each Nare BID  . insulin aspart  0-15 Units Subcutaneous 6 times per day  . insulin glargine  5 Units Subcutaneous Daily  . metoprolol  2.5 mg Intravenous 4 times per day  . pantoprazole (PROTONIX) IV  40 mg Intravenous BID  . sodium chloride  10-40 mL Intracatheter Q12H   Continuous Infusions: . sodium chloride 75 mL/hr at 09/06/14 0452  . amiodarone 30 mg/hr (09/06/14 0452)  . bupivacaine 0.25 % ON-Q pump DUAL CATH 300 mL     PRN Meds:.alum & mag hydroxide-simeth, hydrALAZINE, LORazepam, morphine injection, ondansetron **OR** ondansetron (ZOFRAN) IV, sodium chloride    Filed Vitals:   09/06/14 0402 09/06/14 0500 09/06/14 0600 09/06/14 0700  BP:  125/64 131/68 128/65  Pulse:  64 75 64  Temp: 98.3 F (36.8 C)   97.7 F (36.5 C)  TempSrc: Oral   Oral  Resp:  19 21 21   Height:      Weight:      SpO2:  99% 97% 97%    Intake/Output Summary (Last 24 hours) at 09/06/14 0845 Last data filed at 09/06/14 0700  Gross per 24 hour  Intake 2401.6 ml  Output   1060 ml  Net 1341.6 ml    LABS: Basic Metabolic Panel:  Recent Labs  09/04/14 0239  NA 135*  K 3.4*  CL 99  CO2 22  GLUCOSE 86  BUN 13  CREATININE 0.57  CALCIUM 8.2*   Liver Function Tests:  Recent Labs  09/04/14 0239  AST 39*  ALT 40*  ALKPHOS 65  BILITOT 0.3  PROT 5.2*  ALBUMIN 2.3*   No results for input(s): LIPASE, AMYLASE in the last 72 hours. CBC:  Recent Labs  09/04/14 0239  WBC 9.4  HGB 8.4*  HCT 25.0*  MCV 86.2  PLT 260   Cardiac Enzymes: No results for input(s): CKTOTAL, CKMB, CKMBINDEX, TROPONINI in the last 72 hours. BNP: Invalid input(s): POCBNP D-Dimer: No results for  input(s): DDIMER in the last 72 hours. Hemoglobin A1C: No results for input(s): HGBA1C in the last 72 hours. Fasting Lipid Panel: No results for input(s): CHOL, HDL, LDLCALC, TRIG, CHOLHDL, LDLDIRECT in the last 72 hours. Thyroid Function Tests: No results for input(s): TSH, T4TOTAL, T3FREE, THYROIDAB in the last 72 hours.  Invalid input(s): FREET3 Anemia Panel: No results for input(s): VITAMINB12, FOLATE, FERRITIN, TIBC, IRON, RETICCTPCT in the last 72 hours.  RADIOLOGY: Dg Abd Portable 1v  09/02/2014   CLINICAL DATA:  Progressive abdominal pain. Whipple procedure 3 days ago.  EXAM: PORTABLE ABDOMEN - 1 VIEW  COMPARISON:  CT of the abdomen pelvis 07/14/2014.  FINDINGS: Surgical drains are in place. The transverse colon somewhat low lying. There is relatively little gas over the upper abdomen. This may represent fluid or inflammation in the upper abdomen. Laparotomy is noted.  IMPRESSION: Low positioning of the transverse colon may represent mass effect following surgery. Fluid or inflammation is not excluded. CT of the abdomen pelvis may be useful for further evaluation if clinically indicated.   Electronically Signed   By: Lawrence Santiago M.D.   On: 09/02/2014 13:55  PHYSICAL EXAM General: NAD Neck: No JVD, no thyromegaly or thyroid nodule.  Lungs: Decreased breath sounds at bases bilaterally.  CV: Nondisplaced PMI.  Heart regular S1/S2, no S3/S4, no murmur.  No peripheral edema.   Abdomen: Soft, mildly distended, surgical site tenderness Neurologic: Alert and oriented x 3.  Psych: Normal affect. Extremities: No clubbing or cyanosis.   TELEMETRY: Reviewed telemetry pt in NSR  ASSESSMENT AND PLAN: 68 yo with paroxysmal atrial fibrillation is now s/p Whipple procedure for pancreatic cancer.  She was in atrial fibrillation post-operatively, now back in NSR on amiodarone gtt.  - When taking po, will have her stop amiodarone and go back to flecainide (checked with pharmacy, this  transition should be ok).  - She will need anticoagulation.  Heparin gtt stopped due to blood in NG tube.  This has resolved.  Toradol stopped.  She will need to go back on Xarelto eventually.  However, if ok with surgery, can restart heparin without bolus while she remains NPO.    [name] 09/06/2014 8:48 AM

## 2014-09-07 LAB — GLUCOSE, CAPILLARY
GLUCOSE-CAPILLARY: 78 mg/dL (ref 70–99)
GLUCOSE-CAPILLARY: 84 mg/dL (ref 70–99)
Glucose-Capillary: 109 mg/dL — ABNORMAL HIGH (ref 70–99)
Glucose-Capillary: 76 mg/dL (ref 70–99)
Glucose-Capillary: 94 mg/dL (ref 70–99)
Glucose-Capillary: 94 mg/dL (ref 70–99)
Glucose-Capillary: 97 mg/dL (ref 70–99)

## 2014-09-07 LAB — TYPE AND SCREEN
ABO/RH(D): O POS
ANTIBODY SCREEN: NEGATIVE
UNIT DIVISION: 0
UNIT DIVISION: 0
Unit division: 0
Unit division: 0

## 2014-09-07 LAB — COMPREHENSIVE METABOLIC PANEL
ALBUMIN: 2.5 g/dL — AB (ref 3.5–5.2)
ALK PHOS: 61 U/L (ref 39–117)
ALT: 26 U/L (ref 0–35)
AST: 21 U/L (ref 0–37)
Anion gap: 17 — ABNORMAL HIGH (ref 5–15)
BILIRUBIN TOTAL: 0.4 mg/dL (ref 0.3–1.2)
BUN: 5 mg/dL — ABNORMAL LOW (ref 6–23)
CALCIUM: 8.2 mg/dL — AB (ref 8.4–10.5)
CO2: 18 mEq/L — ABNORMAL LOW (ref 19–32)
Chloride: 98 mEq/L (ref 96–112)
Creatinine, Ser: 0.51 mg/dL (ref 0.50–1.10)
GFR calc Af Amer: >90 mL/min (ref >90)
GLUCOSE: 81 mg/dL (ref 70–99)
Potassium: 3.7 mEq/L (ref 3.7–5.3)
SODIUM: 133 meq/L — AB (ref 137–147)
Total Protein: 5.5 g/dL — ABNORMAL LOW (ref 6.0–8.3)

## 2014-09-07 LAB — MAGNESIUM: MAGNESIUM: 1.8 mg/dL (ref 1.5–2.5)

## 2014-09-07 LAB — CBC
HCT: 24.4 % — ABNORMAL LOW (ref 36.0–46.0)
HEMOGLOBIN: 8.3 g/dL — AB (ref 12.0–15.0)
MCH: 29 pg (ref 26.0–34.0)
MCHC: 34 g/dL (ref 30.0–36.0)
MCV: 85.3 fL (ref 78.0–100.0)
Platelets: 288 10*3/uL (ref 150–400)
RBC: 2.86 MIL/uL — ABNORMAL LOW (ref 3.87–5.11)
RDW: 14.5 % (ref 11.5–15.5)
WBC: 9.1 10*3/uL (ref 4.0–10.5)

## 2014-09-07 LAB — HEPARIN LEVEL (UNFRACTIONATED): Heparin Unfractionated: 0.44 IU/mL (ref 0.30–0.70)

## 2014-09-07 MED ORDER — IMPACT PO LIQD
237.0000 mL | Freq: Three times a day (TID) | ORAL | Status: DC
  Administered 2014-09-07: 237 mL via ORAL
  Filled 2014-09-07 (×14): qty 237

## 2014-09-07 MED ORDER — METOPROLOL SUCCINATE ER 25 MG PO TB24
25.0000 mg | ORAL_TABLET | Freq: Every day | ORAL | Status: DC
  Administered 2014-09-07 – 2014-09-09 (×3): 25 mg via ORAL
  Filled 2014-09-07 (×4): qty 1

## 2014-09-07 MED ORDER — HEPARIN (PORCINE) IN NACL 100-0.45 UNIT/ML-% IJ SOLN
1450.0000 [IU]/h | INTRAMUSCULAR | Status: AC
  Administered 2014-09-07: 1450 [IU]/h via INTRAVENOUS
  Filled 2014-09-07: qty 250

## 2014-09-07 MED ORDER — LOSARTAN POTASSIUM 50 MG PO TABS
50.0000 mg | ORAL_TABLET | Freq: Every day | ORAL | Status: DC
  Administered 2014-09-07 – 2014-09-09 (×3): 50 mg via ORAL
  Filled 2014-09-07 (×4): qty 1

## 2014-09-07 MED ORDER — FLECAINIDE ACETATE 100 MG PO TABS
100.0000 mg | ORAL_TABLET | Freq: Two times a day (BID) | ORAL | Status: DC
  Administered 2014-09-07 – 2014-09-09 (×6): 100 mg via ORAL
  Filled 2014-09-07 (×8): qty 1

## 2014-09-07 MED ORDER — FLECAINIDE ACETATE 100 MG PO TABS
100.0000 mg | ORAL_TABLET | Freq: Two times a day (BID) | ORAL | Status: DC
  Filled 2014-09-07: qty 1

## 2014-09-07 MED ORDER — RIVAROXABAN 20 MG PO TABS
20.0000 mg | ORAL_TABLET | Freq: Every day | ORAL | Status: DC
  Administered 2014-09-07 – 2014-09-09 (×3): 20 mg via ORAL
  Filled 2014-09-07 (×4): qty 1

## 2014-09-07 NOTE — Progress Notes (Signed)
Patient ID: [name], female   DOB: 12-24-1945, 68 y.o.   MRN: [id number] 8 Days Post-Op  Subjective: Pt walked almost 1 mile yesterday.  Doing much better.  Had more flatus and a few small BMs.    Objective: Vital signs in last 24 hours: Temp:  [97.4 F (36.3 C)-98.7 F (37.1 C)] 98.7 F (37.1 C) (12/16 0700) Pulse Rate:  [63-87] 87 (12/16 0800) Resp:  [18-27] 18 (12/16 0800) BP: (126-154)/(62-79) 140/71 mmHg (12/16 0705) SpO2:  [96 %-100 %] 100 % (12/16 0800) Last BM Date: 09/06/14  Intake/Output from previous day: 12/15 0701 - 12/16 0700 In: 2637.3 [P.O.:600; I.V.:1737.3; IV Piggyback:300] Out: 730 [Emesis/NG output:400; Drains:330]  JP-265/200 cc -SS Intake/Output this shift: Total I/O In: -  Out: 125 [Drains:125]  General appearance: alert and cooperative GI: soft, JPs-SS, no ttp, no guarding  Lab Results:   Recent Labs  09/06/14 0905 09/07/14 0513  WBC 8.8 9.1  HGB 8.4* 8.3*  HCT 24.7* 24.4*  PLT 292 288   BMET  Recent Labs  09/06/14 0905 09/07/14 0513  NA 137 133*  K 3.3* 3.7  CL 101 98  CO2 18* 18*  GLUCOSE 76 81  BUN 8 5*  CREATININE 0.49* 0.51  CALCIUM 8.1* 8.2*    Studies/Results: No results found.  Anti-infectives: Anti-infectives    Start     Dose/Rate Route Frequency Ordered Stop   08/30/14 1530  cefOXitin (MEFOXIN) 1 g in dextrose 5 % 50 mL IVPB     1 g100 mL/hr over 30 Minutes Intravenous Every 6 hours 08/30/14 1453 08/31/14 0415   08/30/14 0730  ceFAZolin (ANCEF) IVPB 2 g/50 mL premix  Status:  Discontinued     2 g100 mL/hr over 30 Minutes Intravenous 3 times per day 08/30/14 0716 08/30/14 1453   08/30/14 0600  cefOXitin (MEFOXIN) 2 g in dextrose 5 % 50 mL IVPB     2 g100 mL/hr over 30 Minutes Intravenous On call to O.R. 08/29/14 1405 08/30/14 0840      Assessment/Plan: s/p Procedure(s): WHIPPLE PROCEDURE (N/A) LAPAROSCOPY DIAGNOSTIC (N/A) BID protonix Flecainide for atrial fibrillation Heparin for afib- transition  to [place] floor with tele tomorrow as long as no rapid afib overnight.   Continue ambulation   LOS: 8 days    Jarrod Mcenery 09/07/2014

## 2014-09-07 NOTE — Progress Notes (Addendum)
ANTICOAGULATION CONSULT NOTE - Follow Up Consult  Pharmacy Consult for Xarelto Indication: atrial fibrillation  Allergies  Allergen Reactions  . Codeine Other (See Comments)    Stomach pain  . Morphine And Related Other (See Comments)    Severe headache. Pt has not had since 90s and is ok with taking morphine.  . Sulfa Antibiotics Hives    Patient Measurements: Height: 5\' 3"  (160 cm) Weight: 204 lb 12.9 oz (92.9 kg) IBW/kg (Calculated) : 52.4 Heparin Dosing Weight:  73kg  Vital Signs: Temp: 98.7 F (37.1 C) (12/16 0700) Temp Source: Oral (12/16 0700) BP: 154/72 mmHg (12/16 0357) Pulse Rate: 73 (12/16 0357)  Labs:  Recent Labs  09/06/14 0905 09/06/14 2045 09/07/14 0500 09/07/14 0513  HGB 8.4*  --   --  8.3*  HCT 24.7*  --   --  24.4*  PLT 292  --   --  288  HEPARINUNFRC  --  <0.10* 0.44  --   CREATININE 0.49*  --   --  0.51    Estimated Creatinine Clearance: 73.9 mL/min (by C-G formula based on Cr of 0.51).   Medications:  Heparin @ 1450 units/hr  Assessment: 68 yo female s/p Whipple procedure (12/8) for pancreatic cancer. She was on Xarelto at home for AFib and has been on heparin post-op. Blood was noted from the NGT on 12/13 and heparin was stopped but this has now resolved. Heparin required a rate increase last evening, this morning heparin level is therapeutic at 0.44 units/mL on 1450 units/hr. Hgb is low but stable post-op at 8.3, plts 288. No bleeding noted since resumption.  Goal of Therapy:  Heparin level 0.3-0.7 units/ml Monitor platelets by anticoagulation protocol: Yes   Plan:   1. Continue heparin gtt at 1450 units/hr until this evening when Xarelto dose is given 2. Resume Xarelto 20mg  qsupper tonight 3. CBC in the morning, then q72h 4. Follow for s/s bleeding  Yichen Gilardi D. [place] Canty, PharmD, BCPS Clinical Pharmacist Pager: [phone] 09/07/2014 8:47 AM

## 2014-09-07 NOTE — Progress Notes (Signed)
MD verbalized plan for tx today, no orders received, this RN paged MD x3 with no call back, MD office also contact without results, pt currently stable, c/o pain meds given, c/o nausea meds given

## 2014-09-07 NOTE — Progress Notes (Signed)
Patient ID: ANAHITA CUA, female   DOB: 06/21/1946, 68 y.o.   MRN: [id number]   SUBJECTIVE: Clear liquid diet, NG tube to come out today.  Remains in NSR.   Scheduled Meds: . antiseptic oral rinse  7 mL Mouth Rinse q12n4p  . chlorhexidine  15 mL Mouth Rinse BID  . flecainide  100 mg Oral Q12H  . fluticasone  1 spray Each Nare BID  . insulin aspart  0-15 Units Subcutaneous 6 times per day  . insulin glargine  5 Units Subcutaneous Daily  . losartan  50 mg Oral Daily  . metoprolol succinate  25 mg Oral Daily  . pantoprazole (PROTONIX) IV  40 mg Intravenous BID  . pneumococcal 23 valent vaccine  0.5 mL Intramuscular Tomorrow-1000  . sodium chloride  10-40 mL Intracatheter Q12H   Continuous Infusions: . sodium chloride 75 mL/hr at 09/06/14 2317  . bupivacaine 0.25 % ON-Q pump DUAL CATH 300 mL     PRN Meds:.alum & mag hydroxide-simeth, hydrALAZINE, LORazepam, morphine injection, ondansetron **OR** ondansetron (ZOFRAN) IV, sodium chloride    Filed Vitals:   09/06/14 1904 09/06/14 2317 09/07/14 0357 09/07/14 0700  BP: 144/77 139/63 154/72   Pulse: 75 67 73   Temp: 98 F (36.7 C) 98.6 F (37 C) 98.1 F (36.7 C) 98.7 F (37.1 C)  TempSrc: Oral Oral Oral Oral  Resp: 24 24 20    Height:      Weight:      SpO2: 98% 96% 99%     Intake/Output Summary (Last 24 hours) at 09/07/14 0839 Last data filed at 09/07/14 0825  Gross per 24 hour  Intake 2425.64 ml  Output    855 ml  Net 1570.64 ml    LABS: Basic Metabolic Panel:  Recent Labs  09/06/14 0905 09/07/14 0513  NA 137 133*  K 3.3* 3.7  CL 101 98  CO2 18* 18*  GLUCOSE 76 81  BUN 8 5*  CREATININE 0.49* 0.51  CALCIUM 8.1* 8.2*  MG  --  1.8   Liver Function Tests:  Recent Labs  09/07/14 0513  AST 21  ALT 26  ALKPHOS 61  BILITOT 0.4  PROT 5.5*  ALBUMIN 2.5*   No results for input(s): LIPASE, AMYLASE in the last 72 hours. CBC:  Recent Labs  09/06/14 0905 09/07/14 0513  WBC 8.8 9.1  HGB 8.4* 8.3*  HCT  24.7* 24.4*  MCV 85.2 85.3  PLT 292 288   Cardiac Enzymes: No results for input(s): CKTOTAL, CKMB, CKMBINDEX, TROPONINI in the last 72 hours. BNP: Invalid input(s): POCBNP D-Dimer: No results for input(s): DDIMER in the last 72 hours. Hemoglobin A1C: No results for input(s): HGBA1C in the last 72 hours. Fasting Lipid Panel: No results for input(s): CHOL, HDL, LDLCALC, TRIG, CHOLHDL, LDLDIRECT in the last 72 hours. Thyroid Function Tests: No results for input(s): TSH, T4TOTAL, T3FREE, THYROIDAB in the last 72 hours.  Invalid input(s): FREET3 Anemia Panel: No results for input(s): VITAMINB12, FOLATE, FERRITIN, TIBC, IRON, RETICCTPCT in the last 72 hours.  RADIOLOGY: Dg Abd Portable 1v  09/02/2014   CLINICAL DATA:  Progressive abdominal pain. Whipple procedure 3 days ago.  EXAM: PORTABLE ABDOMEN - 1 VIEW  COMPARISON:  CT of the abdomen pelvis 07/14/2014.  FINDINGS: Surgical drains are in place. The transverse colon somewhat low lying. There is relatively little gas over the upper abdomen. This may represent fluid or inflammation in the upper abdomen. Laparotomy is noted.  IMPRESSION: Low positioning of the transverse colon may  represent mass effect following surgery. Fluid or inflammation is not excluded. CT of the abdomen pelvis may be useful for further evaluation if clinically indicated.   Electronically Signed   By: Lawrence Santiago M.D.   On: 09/02/2014 13:55    PHYSICAL EXAM General: NAD Neck: No JVD, no thyromegaly or thyroid nodule.  Lungs: Decreased breath sounds at bases bilaterally.  CV: Nondisplaced PMI.  Heart regular S1/S2, no S3/S4, no murmur.  No peripheral edema.   Abdomen: Soft, mildly distended, surgical site tenderness Neurologic: Alert and oriented x 3.  Psych: Normal affect. Extremities: No clubbing or cyanosis.   TELEMETRY: Reviewed telemetry pt in NSR  ASSESSMENT AND PLAN: 68 yo with paroxysmal atrial fibrillation is now s/p Whipple procedure for pancreatic  cancer.  She was in atrial fibrillation post-operatively, now back in NSR on amiodarone gtt.  - Stop amiodarone gtt now that taking po and start back on home flecainide 100 mg bid this evening (checked with pharmacy, this transition should be ok).  - Stable hemoglobin, no bleeding.  Will stop heparin gtt and transition back to home Xarelto.  - Can restart home Toprol XL and losartan.     [name] 09/07/2014 8:39 AM

## 2014-09-07 NOTE — Progress Notes (Signed)
Pt c/o not "feeling good all day since tube out", NGT d/c around 1000, pt currently c/o nausea and abd pain,meds given MD/N(Wyatt), no new orders received, nursing will cont to monitor

## 2014-09-08 ENCOUNTER — Telehealth: Payer: Self-pay | Admitting: Genetic Counselor

## 2014-09-08 ENCOUNTER — Encounter: Payer: Self-pay | Admitting: Genetic Counselor

## 2014-09-08 DIAGNOSIS — Z1379 Encounter for other screening for genetic and chromosomal anomalies: Secondary | ICD-10-CM | POA: Insufficient documentation

## 2014-09-08 DIAGNOSIS — Z7901 Long term (current) use of anticoagulants: Secondary | ICD-10-CM

## 2014-09-08 LAB — CBC
HCT: 24.8 % — ABNORMAL LOW (ref 36.0–46.0)
Hemoglobin: 8.4 g/dL — ABNORMAL LOW (ref 12.0–15.0)
MCH: 28.6 pg (ref 26.0–34.0)
MCHC: 33.9 g/dL (ref 30.0–36.0)
MCV: 84.4 fL (ref 78.0–100.0)
PLATELETS: 315 10*3/uL (ref 150–400)
RBC: 2.94 MIL/uL — AB (ref 3.87–5.11)
RDW: 14.3 % (ref 11.5–15.5)
WBC: 7.4 10*3/uL (ref 4.0–10.5)

## 2014-09-08 LAB — GLUCOSE, CAPILLARY
GLUCOSE-CAPILLARY: 104 mg/dL — AB (ref 70–99)
GLUCOSE-CAPILLARY: 74 mg/dL (ref 70–99)
GLUCOSE-CAPILLARY: 90 mg/dL (ref 70–99)
GLUCOSE-CAPILLARY: 94 mg/dL (ref 70–99)
GLUCOSE-CAPILLARY: 95 mg/dL (ref 70–99)
Glucose-Capillary: 94 mg/dL (ref 70–99)
Glucose-Capillary: 87 mg/dL (ref 70–99)
Glucose-Capillary: 96 mg/dL (ref 70–99)

## 2014-09-08 LAB — BASIC METABOLIC PANEL
Anion gap: 15 (ref 5–15)
BUN: 3 mg/dL — AB (ref 6–23)
CO2: 20 mEq/L (ref 19–32)
CREATININE: 0.54 mg/dL (ref 0.50–1.10)
Calcium: 8 mg/dL — ABNORMAL LOW (ref 8.4–10.5)
Chloride: 102 mEq/L (ref 96–112)
GFR calc Af Amer: >90 mL/min (ref >90)
Glucose, Bld: 88 mg/dL (ref 70–99)
POTASSIUM: 3.5 meq/L — AB (ref 3.7–5.3)
Sodium: 137 mEq/L (ref 137–147)

## 2014-09-08 MED ORDER — PANTOPRAZOLE SODIUM 40 MG PO TBEC
40.0000 mg | DELAYED_RELEASE_TABLET | Freq: Two times a day (BID) | ORAL | Status: DC
  Administered 2014-09-08 – 2014-09-09 (×3): 40 mg via ORAL
  Filled 2014-09-08 (×3): qty 1

## 2014-09-08 NOTE — Progress Notes (Signed)
Subjective: Trying to sleep.  No SOB or dizziness.  Occasionally she cn feel a flutter.   Objective: Vital signs in last 24 hours: Temp:  [98 F (36.7 C)-99.3 F (37.4 C)] 98.6 F (37 C) (12/17 0700) Pulse Rate:  [69-72] 69 (12/17 0703) Resp:  [17-22] 19 (12/17 0703) BP: (120-140)/(55-81) 120/71 mmHg (12/17 0703) SpO2:  [97 %-100 %] 100 % (12/17 0703) Last BM Date: 09/06/14  Intake/Output from previous day: 12/16 0701 - 12/17 0700 In: 1630.1 [P.O.:240; I.V.:1390.1] Out: 1695 [Urine:1000; Drains:495] Intake/Output this shift: Total I/O In: 181.3 [I.V.:181.3] Out: 125 [Drains:125]  Medications Current Facility-Administered Medications  Medication Dose Route Frequency Provider Last Rate Last Dose  . *STUDY* feeding supplement (IMPACT AR) liquid 237 mL  237 mL Oral TID BM Dalene Carrow, RD   237 mL at 09/07/14 1517  . 0.9 %  sodium chloride infusion   Intravenous Continuous [name], MD 75 mL/hr at 09/08/14 0038    . alum & mag hydroxide-simeth (MAALOX/MYLANTA) 200-200-20 MG/5ML suspension 30 mL  30 mL Oral Q4H PRN Fanny Skates, MD   30 mL at 09/07/14 1942  . antiseptic oral rinse (CPC / CETYLPYRIDINIUM CHLORIDE 0.05%) solution 7 mL  7 mL Mouth Rinse q12n4p [name], MD   7 mL at 09/07/14 1518  . chlorhexidine (PERIDEX) 0.12 % solution 15 mL  15 mL Mouth Rinse BID [name], MD   15 mL at 09/08/14 0828  . flecainide (TAMBOCOR) tablet 100 mg  100 mg Oral Q12H [name], MD   100 mg at 09/08/14 1001  . fluticasone (FLONASE) 50 MCG/ACT nasal spray 1 spray  1 spray Each Nare BID [name], MD   1 spray at 09/08/14 1002  . hydrALAZINE (APRESOLINE) injection 10-20 mg  10-20 mg Intravenous Q4H PRN Donnie Mesa, MD      . insulin aspart (novoLOG) injection 0-15 Units  0-15 Units Subcutaneous 6 times per day [name], MD   2 Units at 09/02/14 2348  . insulin glargine (LANTUS) injection 5 Units  5 Units Subcutaneous Daily [name], MD   5 Units at  09/08/14 1001  . LORazepam (ATIVAN) injection 0.5 mg  0.5 mg Intravenous Q6H PRN Fanny Skates, MD   0.5 mg at 09/06/14 2316  . losartan (COZAAR) tablet 50 mg  50 mg Oral Daily Larey Dresser, MD   50 mg at 09/08/14 1001  . metoprolol succinate (TOPROL-XL) 24 hr tablet 25 mg  25 mg Oral Daily Larey Dresser, MD   25 mg at 09/08/14 1001  . morphine 2 MG/ML injection 1-2 mg  1-2 mg Intravenous Q1H PRN [name], MD   2 mg at 09/08/14 1001  . ondansetron (ZOFRAN) tablet 4 mg  4 mg Oral Q6H PRN [name], MD       Or  . ondansetron (ZOFRAN) injection 4 mg  4 mg Intravenous Q6H PRN [name], MD   4 mg at 09/07/14 1659  . pantoprazole (PROTONIX) EC tablet 40 mg  40 mg Oral BID [name], MD      . pneumococcal 23 valent vaccine (PNU-IMMUNE) injection 0.5 mL  0.5 mL Intramuscular Tomorrow-1000 [name], MD      . rivaroxaban (XARELTO) tablet 20 mg  20 mg Oral Q supper Lauren Bajbus, RPH   20 mg at 09/07/14 1749  . sodium chloride 0.9 % injection 10-40 mL  10-40 mL Intracatheter Q12H [name], MD   10 mL at 09/08/14 1002  .  sodium chloride 0.9 % injection 10-40 mL  10-40 mL Intracatheter PRN [name], MD        PE: General appearance: alert, cooperative and no distress Lungs: clear to auscultation bilaterally Heart: regular rate and rhythm and 1/6 sys MM Extremities: No LEE Pulses: 2+ and symmetric Skin: Warm and dry Neurologic: Grossly normal  Lab Results:   Recent Labs  09/06/14 0905 09/07/14 0513 09/08/14 0430  WBC 8.8 9.1 7.4  HGB 8.4* 8.3* 8.4*  HCT 24.7* 24.4* 24.8*  PLT 292 288 315   BMET  Recent Labs  09/06/14 0905 09/07/14 0513 09/08/14 0430  NA 137 133* 137  K 3.3* 3.7 3.5*  CL 101 98 102  CO2 18* 18* 20  GLUCOSE 76 81 88  BUN 8 5* 3*  CREATININE 0.49* 0.51 0.54  CALCIUM 8.1* 8.2* 8.0*      Assessment/Plan    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  Maintaining NSR.  On flecainide mg BID.  Toprol 25   Chronic anticoagulation.   Xarelto   LOS: 9 days    HAGER, BRYAN PA-C 09/08/2014 10:53 AM  Patient seen and examined and history reviewed. Agree with above findings and plan. Maintaining NSR well. Continue Flecainide and Xarelto.   Peter [place], [place] 09/08/2014 11:48 AM

## 2014-09-08 NOTE — Progress Notes (Signed)
Pt tx 6N per MD order, pt VSS, pt tol liquids, pt verbalized understanding of tx, report called to receiving RN, all questions answered

## 2014-09-08 NOTE — Progress Notes (Signed)
Patient ID: LITITIA SEN, female   DOB: 26-Sep-1945, 68 y.o.   MRN: [id number] Patient ID: PALESTINE MOSCO, female   DOB: 1945-12-08, 67 y.o.   MRN: [id number] 9 Days Post-Op  Subjective: Pt walked almost 1 mile yesterday.  Doing much better.  Had more flatus and a few small BMs.    Objective: Vital signs in last 24 hours: Temp:  [98 F (36.7 C)-99.3 F (37.4 C)] 98.6 F (37 C) (12/17 0700) Pulse Rate:  [69-72] 69 (12/17 0703) Resp:  [17-22] 19 (12/17 0703) BP: (120-140)/(55-81) 120/71 mmHg (12/17 0703) SpO2:  [97 %-100 %] 100 % (12/17 0703) Last BM Date: 09/06/14  Intake/Output from previous day: 12/16 0701 - 12/17 0700 In: 1630.1 [P.O.:240; I.V.:1390.1] Out: 1695 [Urine:1000; Drains:495]  JP-265/200 cc -SS Intake/Output this shift: Total I/O In: 181.3 [I.V.:181.3] Out: 125 [Drains:125]  General appearance: alert and cooperative GI: soft, JPs-SS, no ttp, no guarding  Lab Results:   Recent Labs  09/07/14 0513 09/08/14 0430  WBC 9.1 7.4  HGB 8.3* 8.4*  HCT 24.4* 24.8*  PLT 288 315   BMET  Recent Labs  09/07/14 0513 09/08/14 0430  NA 133* 137  K 3.7 3.5*  CL 98 102  CO2 18* 20  GLUCOSE 81 88  BUN 5* 3*  CREATININE 0.51 0.54  CALCIUM 8.2* 8.0*    Studies/Results: No results found.  Anti-infectives: Anti-infectives    Start     Dose/Rate Route Frequency Ordered Stop   08/30/14 1530  cefOXitin (MEFOXIN) 1 g in dextrose 5 % 50 mL IVPB     1 g100 mL/hr over 30 Minutes Intravenous Every 6 hours 08/30/14 1453 08/31/14 0415   08/30/14 0730  ceFAZolin (ANCEF) IVPB 2 g/50 mL premix  Status:  Discontinued     2 g100 mL/hr over 30 Minutes Intravenous 3 times per day 08/30/14 0716 08/30/14 1453   08/30/14 0600  cefOXitin (MEFOXIN) 2 g in dextrose 5 % 50 mL IVPB     2 g100 mL/hr over 30 Minutes Intravenous On call to O.R. 08/29/14 1405 08/30/14 0840      Assessment/Plan: s/p Procedure(s): WHIPPLE PROCEDURE (N/A) LAPAROSCOPY DIAGNOSTIC (N/A) BID  protonix Flecainide for atrial fibrillation xarelto for atrial fibrillation Transfer to floor   Continue ambulation   LOS: 9 days    Katherine Syme 09/08/2014

## 2014-09-08 NOTE — Telephone Encounter (Signed)
Left good news message on VM. 

## 2014-09-09 ENCOUNTER — Encounter: Payer: Self-pay | Admitting: Genetic Counselor

## 2014-09-09 ENCOUNTER — Telehealth: Payer: Self-pay | Admitting: Genetic Counselor

## 2014-09-09 LAB — GLUCOSE, CAPILLARY
GLUCOSE-CAPILLARY: 91 mg/dL (ref 70–99)
GLUCOSE-CAPILLARY: 86 mg/dL (ref 70–99)
Glucose-Capillary: 105 mg/dL — ABNORMAL HIGH (ref 70–99)
Glucose-Capillary: 110 mg/dL — ABNORMAL HIGH (ref 70–99)
Glucose-Capillary: 118 mg/dL — ABNORMAL HIGH (ref 70–99)

## 2014-09-09 MED ORDER — ALTEPLASE 2 MG IJ SOLR
2.0000 mg | Freq: Once | INTRAMUSCULAR | Status: AC
  Administered 2014-09-09: 2 mg
  Filled 2014-09-09: qty 2

## 2014-09-09 MED ORDER — SODIUM CHLORIDE 0.9 % IV SOLN
INTRAVENOUS | Status: DC
  Administered 2014-09-09: 10:00:00 via INTRAVENOUS

## 2014-09-09 MED ORDER — ENSURE COMPLETE PO LIQD
237.0000 mL | Freq: Three times a day (TID) | ORAL | Status: DC
  Administered 2014-09-09: 237 mL via ORAL

## 2014-09-09 MED ORDER — ENSURE PUDDING PO PUDG
1.0000 | Freq: Three times a day (TID) | ORAL | Status: DC
  Administered 2014-09-09: 1 via ORAL

## 2014-09-09 MED ORDER — OXYCODONE-ACETAMINOPHEN 5-325 MG PO TABS
1.0000 | ORAL_TABLET | ORAL | Status: DC | PRN
  Administered 2014-09-09: 1 via ORAL
  Administered 2014-09-09 – 2014-09-10 (×2): 2 via ORAL
  Filled 2014-09-09: qty 2
  Filled 2014-09-09: qty 1
  Filled 2014-09-09: qty 2

## 2014-09-09 MED ORDER — INSULIN ASPART 100 UNIT/ML ~~LOC~~ SOLN: 0.0000 [IU] | Freq: Three times a day (TID) | SUBCUTANEOUS | Status: DC

## 2014-09-09 NOTE — Care Management Note (Signed)
  Page 1 of 1   09/09/2014     11:26:16 AM CARE MANAGEMENT NOTE 09/09/2014  Patient:  [name], [name]   Account Number:  [id number]  Date Initiated:  09/06/2014  Documentation initiated by:  MAYO,HENRIETTA  Subjective/Objective Assessment:   s/p Whipple Procedure; lives with spouse    PCP  Florina Ou     Action/Plan:   Anticipated DC Date:  09/12/2014   Anticipated DC Plan:  [place]  CM consult      Choice offered to / List presented to:             Status of service:  In process, will continue to follow Medicare Important Message given?  YES (If response is "NO", the following Medicare IM given date fields will be blank) Date Medicare IM given:  09/06/2014 Medicare IM given by:  MAYO,HENRIETTA Date Additional Medicare IM given:  09/09/2014 Additional Medicare IM given by:  Magdalen Spatz  Discharge Disposition:    Per UR Regulation:  Reviewed for med. necessity/level of care/duration of stay  If discussed at [place] of Stay Meetings, dates discussed:   09/08/2014    Comments:

## 2014-09-09 NOTE — Progress Notes (Signed)
Patient ID: [name], female   DOB: 05-Aug-1946, 68 y.o.   MRN: [id number] 10 Days Post-Op  Subjective: Pt continues to feel better.    Objective: Vital signs in last 24 hours: Temp:  [97.8 F (36.6 C)-98.4 F (36.9 C)] 98.1 F (36.7 C) (12/18 0555) Pulse Rate:  [65-70] 70 (12/18 0555) Resp:  [17-18] 17 (12/18 0555) BP: (114-137)/(62-77) 137/77 mmHg (12/18 0555) SpO2:  [98 %-99 %] 98 % (12/18 0555) Weight:  [183 lb (83.008 kg)] 183 lb (83.008 kg) (12/17 1948) Last BM Date: 09/06/14  Intake/Output from previous day: 12/17 0701 - 12/18 0700 In: 1885 [I.V.:1885] Out: 780 [Urine:400; Drains:380]  JP-265/200 cc -SS Intake/Output this shift: Total I/O In: 540 [P.O.:540] Out: -   General appearance: alert and cooperative GI: soft, JPs-SS, no ttp, no guarding  Lab Results:   Recent Labs  09/07/14 0513 09/08/14 0430  WBC 9.1 7.4  HGB 8.3* 8.4*  HCT 24.4* 24.8*  PLT 288 315   BMET  Recent Labs  09/07/14 0513 09/08/14 0430  NA 133* 137  K 3.7 3.5*  CL 98 102  CO2 18* 20  GLUCOSE 81 88  BUN 5* 3*  CREATININE 0.51 0.54  CALCIUM 8.2* 8.0*    Studies/Results: No results found.  Anti-infectives: Anti-infectives    Start     Dose/Rate Route Frequency Ordered Stop   08/30/14 1530  cefOXitin (MEFOXIN) 1 g in dextrose 5 % 50 mL IVPB     1 g100 mL/hr over 30 Minutes Intravenous Every 6 hours 08/30/14 1453 08/31/14 0415   08/30/14 0730  ceFAZolin (ANCEF) IVPB 2 g/50 mL premix  Status:  Discontinued     2 g100 mL/hr over 30 Minutes Intravenous 3 times per day 08/30/14 0716 08/30/14 1453   08/30/14 0600  cefOXitin (MEFOXIN) 2 g in dextrose 5 % 50 mL IVPB     2 g100 mL/hr over 30 Minutes Intravenous On call to O.R. 08/29/14 1405 08/30/14 0840      Assessment/Plan: s/p Procedure(s): WHIPPLE PROCEDURE (N/A) LAPAROSCOPY DIAGNOSTIC (N/A) BID protonix Flecainide for atrial fibrillation xarelto for atrial fibrillation  Continue ambulation Calorie counts. Home  when tolerating adequate PO.     LOS: 10 days    Traylon Schimming 09/09/2014
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
## 2014-09-09 NOTE — Progress Notes (Signed)
Subjective: Abdominal pain better today.  No SOB or dizziness.  Occasionally she can feel a flutter.   Objective: Vital signs in last 24 hours: Temp:  [97.8 F (36.6 C)-98.8 F (37.1 C)] 98.1 F (36.7 C) (12/18 0555) Pulse Rate:  [65-72] 70 (12/18 0555) Resp:  [17-21] 17 (12/18 0555) BP: (114-137)/(58-77) 137/77 mmHg (12/18 0555) SpO2:  [97 %-99 %] 98 % (12/18 0555) Weight:  [183 lb (83.008 kg)] 183 lb (83.008 kg) (12/17 1948) Last BM Date: 09/06/14  Intake/Output from previous day: 12/17 0701 - 12/18 0700 In: 1885 [I.V.:1885] Out: 780 [Urine:400; Drains:380] Intake/Output this shift:    Medications Current Facility-Administered Medications  Medication Dose Route Frequency Provider Last Rate Last Dose  . *STUDY* feeding supplement (IMPACT AR) liquid 237 mL  237 mL Oral TID BM Dalene Carrow, RD   237 mL at 09/07/14 1517  . 0.9 %  sodium chloride infusion   Intravenous Continuous [name], MD 75 mL/hr at 09/08/14 1420    . alum & mag hydroxide-simeth (MAALOX/MYLANTA) 200-200-20 MG/5ML suspension 30 mL  30 mL Oral Q4H PRN Fanny Skates, MD   30 mL at 09/07/14 1942  . flecainide (TAMBOCOR) tablet 100 mg  100 mg Oral Q12H [name], MD   100 mg at 09/08/14 2106  . fluticasone (FLONASE) 50 MCG/ACT nasal spray 1 spray  1 spray Each Nare BID [name], MD   1 spray at 09/08/14 2107  . hydrALAZINE (APRESOLINE) injection 10-20 mg  10-20 mg Intravenous Q4H PRN Donnie Mesa, MD      . insulin aspart (novoLOG) injection 0-15 Units  0-15 Units Subcutaneous 6 times per day [name], MD   2 Units at 09/02/14 2348  . insulin glargine (LANTUS) injection 5 Units  5 Units Subcutaneous Daily [name], MD   5 Units at 09/08/14 1001  . LORazepam (ATIVAN) injection 0.5 mg  0.5 mg Intravenous Q6H PRN Fanny Skates, MD   0.5 mg at 09/08/14 2121  . losartan (COZAAR) tablet 50 mg  50 mg Oral Daily Larey Dresser, MD   50 mg at 09/08/14 1001  . metoprolol succinate  (TOPROL-XL) 24 hr tablet 25 mg  25 mg Oral Daily Larey Dresser, MD   25 mg at 09/08/14 1001  . morphine 2 MG/ML injection 1-2 mg  1-2 mg Intravenous Q1H PRN [name], MD   2 mg at 09/09/14 0241  . ondansetron (ZOFRAN) tablet 4 mg  4 mg Oral Q6H PRN [name], MD       Or  . ondansetron (ZOFRAN) injection 4 mg  4 mg Intravenous Q6H PRN [name], MD   4 mg at 09/08/14 1433  . pantoprazole (PROTONIX) EC tablet 40 mg  40 mg Oral BID [name], MD   40 mg at 09/08/14 2106  . pneumococcal 23 valent vaccine (PNU-IMMUNE) injection 0.5 mL  0.5 mL Intramuscular Tomorrow-1000 [name], MD      . rivaroxaban (XARELTO) tablet 20 mg  20 mg Oral Q supper Lauren Bajbus, RPH   20 mg at 09/08/14 1747  . sodium chloride 0.9 % injection 10-40 mL  10-40 mL Intracatheter Q12H [name], MD   10 mL at 09/08/14 1002  . sodium chloride 0.9 % injection 10-40 mL  10-40 mL Intracatheter PRN [name], MD        PE: General appearance: alert, cooperative and no distress Lungs: clear to auscultation bilaterally Heart: regular rate and rhythm and 1/6 sys MM Extremities:  No LEE Pulses: 2+ and symmetric Skin: Warm and dry Neurologic: Grossly normal  Lab Results:   Recent Labs  09/06/14 0905 09/07/14 0513 09/08/14 0430  WBC 8.8 9.1 7.4  HGB 8.4* 8.3* 8.4*  HCT 24.7* 24.4* 24.8*  PLT 292 288 315   BMET  Recent Labs  09/06/14 0905 09/07/14 0513 09/08/14 0430  NA 137 133* 137  K 3.3* 3.7 3.5*  CL 101 98 102  CO2 18* 18* 20  GLUCOSE 76 81 88  BUN 8 5* 3*  CREATININE 0.49* 0.51 0.54  CALCIUM 8.1* 8.2* 8.0*    Telemetry: NSR. No ectopy.  Assessment/Plan    Cancer of head of pancreas-S/P [facility] 08/30/14   HTN (hypertension)  Controlled and stable   OSA (obstructive sleep apnea)   Atrial fibrillation with RVR post op-  Doing well.  Maintaining NSR.  On flecainide mg BID.  Toprol 25   Chronic anticoagulation.   Xarelto   LOS: 10 days    Merryl Buckels [place],  [place] 09/09/2014 8:32 AM

## 2014-09-09 NOTE — Telephone Encounter (Signed)
Revealed no pathogenic mutations found in the [facility] panel testing, but that two VUS found, one each in BARD1 and MSH2.  Will secure email patient the results.

## 2014-09-09 NOTE — Progress Notes (Signed)
HPI: [name] was previously seen in the [facility] clinic due to a personal and family history of cancer and concerns regarding a hereditary predisposition to cancer. Please refer to our prior cancer genetics clinic note for more information regarding [name]'s medical, social and family histories, and our assessment and recommendations, at the time. [name] recent genetic test results were disclosed to her, as were recommendations warranted by these results. These results and recommendations are discussed in more detail below.  GENETIC TEST RESULTS: At the time of [name]'s visit, we recommended she pursue genetic testing of the CancerNext gene panel. This test, which included sequencing and deletion/duplication analysis of the following genes:  APC, ATM, BARD1, BMPR1A, BRCA1, BRCA2, BRIP1, CDH1, CDK4, CDKN2A, CHEK2, EPCAM, GREM1, MLH1, MRE11A, MSH2, MSH6, MUTYH, NBN, NF1, PALB2, PMS2, POLD1, POLE, PTEN, RAD50, RAD51D, SMAD4, SMARCA4, STK11, and TP53.  The report date is September 07, 2014.  Testing was performed at [facility]. Genetic testing was normal, and did not reveal a deleterious mutation in these genes. The test report has been scanned into EPIC and is located under the Media tab. We discussed that this test looked at the same genes that were tested in her daughter, plus a couple more as the BreastNext panel testing has added a couple genes to that panel since her daughter was tested, plus additional genes that looked at risk for pancreatic and endometrial cancer.  We discussed with [name] that since the current genetic testing is not perfect, it is possible there may be a gene mutation in one of these genes that current testing cannot detect, but that chance is small. We also discussed, that it is possible that another gene that has not yet been discovered, or that we have not yet tested, is responsible for the cancer diagnoses in the family, and it  is, therefore, important to remain in touch with cancer genetics in the future so that we can continue to offer [name] the most up to date genetic testing.   CANCER SCREENING RECOMMENDATIONS: This result is reassuring and suggests that [name]'s cancer was most likely not due to an inherited predisposition associated with one of these genes. Most cancers happen by chance and this negative test, along with details of her family history, suggests that her cancer falls into this category. We, therefore, recommended she continue to follow the cancer management and screening guidelines provided by her oncology and primary providers.   RECOMMENDATIONS FOR FAMILY MEMBERS: Women in this family might be at some increased risk of developing cancer, over the general population risk, simply due to the family history of cancer. We recommended women in this family have a yearly mammogram beginning at age 66, or 47 years younger than the earliest age of onset.  Therefore, this family should start in the early 96s based on the breast cancer found in [name]'s daughter at age 75.  Additionally, individuals should have an annual clinical breast exam, and perform monthly breast self-exams. Women in this family should also have a gynecological exam as recommended by their primary provider. All family members should have a colonoscopy by age 49.  FOLLOW-UP: Lastly, we discussed with [name] that cancer genetics is a rapidly advancing field and it is possible that new genetic tests will be appropriate for her and/or her family members in the future. We encouraged her to remain in contact with cancer genetics on an annual basis so we can update her personal  and family histories and let her know of advances in cancer genetics that may benefit this family.   Our contact number was provided. Ms.. [name] questions were answered to her satisfaction, and she knows she is welcome to call us at anytime with additional  questions or concerns.   Roma Kayser, MS, [facility] Certified Genetic Counselor [email or web address]

## 2014-09-10 LAB — CBC
HCT: 24.8 % — ABNORMAL LOW (ref 36.0–46.0)
Hemoglobin: 8.5 g/dL — ABNORMAL LOW (ref 12.0–15.0)
MCH: 28.9 pg (ref 26.0–34.0)
MCHC: 34.3 g/dL (ref 30.0–36.0)
MCV: 84.4 fL (ref 78.0–100.0)
PLATELETS: 321 10*3/uL (ref 150–400)
RBC: 2.94 MIL/uL — ABNORMAL LOW (ref 3.87–5.11)
RDW: 14.5 % (ref 11.5–15.5)
WBC: 6.8 10*3/uL (ref 4.0–10.5)

## 2014-09-10 LAB — BASIC METABOLIC PANEL
Anion gap: 10 (ref 5–15)
BUN: 4 mg/dL — ABNORMAL LOW (ref 6–23)
CO2: 26 mEq/L (ref 19–32)
CREATININE: 0.6 mg/dL (ref 0.50–1.10)
Calcium: 8.2 mg/dL — ABNORMAL LOW (ref 8.4–10.5)
Chloride: 99 mEq/L (ref 96–112)
GFR calc non Af Amer: >90 mL/min (ref >90)
Glucose, Bld: 95 mg/dL (ref 70–99)
Potassium: 3 mEq/L — ABNORMAL LOW (ref 3.7–5.3)
Sodium: 135 mEq/L — ABNORMAL LOW (ref 137–147)

## 2014-09-10 MED ORDER — OXYCODONE-ACETAMINOPHEN 5-325 MG PO TABS: 1.0000 | ORAL_TABLET | ORAL | Status: DC | PRN

## 2014-09-10 NOTE — Discharge Planning (Addendum)
JP x 2 d'cd , instructed pt that she may continue to have some drainage at sites, dressing change teaching done. Copy of AVS to pt who verbalizes understanding and rx. Will dc later this am. d'cd at 0950 with all personal belongings, acomp. By husband at 19.

## 2014-09-10 NOTE — Progress Notes (Signed)
SUBJECTIVE:  No complaints.  Was up brushing her teeth during the VTach strip.  OBJECTIVE:   Vitals:   Filed Vitals:   09/09/14 0555 09/09/14 1455 09/09/14 2250 09/10/14 0609  BP: 137/77 131/72 123/63 126/69  Pulse: 70 70 71 57  Temp: 98.1 F (36.7 C) 98.7 F (37.1 C) 98.4 F (36.9 C) 98.2 F (36.8 C)  TempSrc: Oral Oral Oral Oral  Resp: 17 18 18 18   Height:      Weight:      SpO2: 98% 97% 97%    I&O's:   Intake/Output Summary (Last 24 hours) at 09/10/14 0805 Last data filed at 09/10/14 0547  Gross per 24 hour  Intake   1018 ml  Output    675 ml  Net    343 ml   TELEMETRY: Reviewed telemetry pt in NSR;  Strips labelled VTach appear to be artifact:     PHYSICAL EXAM General: Well developed, well nourished, in no acute distress Head:   Normal cephalic and atramatic  Lungs:  Clear bilaterally to auscultation. Heart:   HRRR S1 S2  No JVD.    Msk:    Normal strength and tone for age.    Neuro: Alert and oriented. Psych:  Normal affect, responds appropriately Skin: No rash   LABS: Basic Metabolic Panel:  Recent Labs  09/08/14 0430 09/10/14 0535  NA 137 135*  K 3.5* 3.0*  CL 102 99  CO2 20 26  GLUCOSE 88 95  BUN 3* 4*  CREATININE 0.54 0.60  CALCIUM 8.0* 8.2*   Liver Function Tests: No results for input(s): AST, ALT, ALKPHOS, BILITOT, PROT, ALBUMIN in the last 72 hours. No results for input(s): LIPASE, AMYLASE in the last 72 hours. CBC:  Recent Labs  09/08/14 0430 09/10/14 0535  WBC 7.4 6.8  HGB 8.4* 8.5*  HCT 24.8* 24.8*  MCV 84.4 84.4  PLT 315 321   Cardiac Enzymes: No results for input(s): CKTOTAL, CKMB, CKMBINDEX, TROPONINI in the last 72 hours. BNP: Invalid input(s): POCBNP D-Dimer: No results for input(s): DDIMER in the last 72 hours. Hemoglobin A1C: No results for input(s): HGBA1C in the last 72 hours. Fasting Lipid Panel: No results for input(s): CHOL, HDL, LDLCALC, TRIG, CHOLHDL, LDLDIRECT in the last 72 hours. Thyroid  Function Tests: No results for input(s): TSH, T4TOTAL, T3FREE, THYROIDAB in the last 72 hours.  Invalid input(s): FREET3 Anemia Panel: No results for input(s): VITAMINB12, FOLATE, FERRITIN, TIBC, IRON, RETICCTPCT in the last 72 hours. Coag Panel:   Lab Results  Component Value Date   INR 1.20 08/31/2014   INR 1.04 08/30/2014   INR 1.42 08/23/2014    RADIOLOGY: Dg Abd Portable 1v  09/02/2014   CLINICAL DATA:  Progressive abdominal pain. Whipple procedure 3 days ago.  EXAM: PORTABLE ABDOMEN - 1 VIEW  COMPARISON:  CT of the abdomen pelvis 07/14/2014.  FINDINGS: Surgical drains are in place. The transverse colon somewhat low lying. There is relatively little gas over the upper abdomen. This may represent fluid or inflammation in the upper abdomen. Laparotomy is noted.  IMPRESSION: Low positioning of the transverse colon may represent mass effect following surgery. Fluid or inflammation is not excluded. CT of the abdomen pelvis may be useful for further evaluation if clinically indicated.   Electronically Signed   By: Lawrence Santiago M.D.   On: 09/02/2014 13:55      ASSESSMENT: AFib  PLAN:  Atrial fibrillation with RVR post op-  Doing well. Maintaining NSR currently. On flecainide  100 mg BID. Toprol 25  Chronic anticoagulation.  Xarelto for stroke prevention.  Being discharged today.  Cardiology f/u with Dr. [name].  Jettie Booze, MD  09/10/2014  8:05 AM

## 2014-09-10 NOTE — Discharge Summary (Signed)
Physician Discharge Summary  Patient ID: [name] MRN: [id number] DOB/AGE: [birth year] 68 y.o.  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  Drains in place.  Diet advanced after ileus resolved.  Drains serous-both discontinued prior to discharge  Consults: none  Significant Diagnostic Studies: path    Discharge Exam: Blood pressure 126/69, pulse 57, temperature 98.2 F (36.8 C), temperature source Oral, resp. rate 18, height 5\' 3"  (1.6 m), weight 183 lb (83.008 kg), SpO2 97 %. Both JPs with serous drainage-one modest in amount.  Both removed according to FB's instruction.  Diet adequate and patient has good understanding   Disposition: 01-Home or Self Care  Discharge Instructions    Call MD for:    Complete by:  As directed   Dr. Florina Ou on Monday and let her know that you are out of the hospital     Diet Carb Modified    Complete by:  As directed      Discharge instructions    Complete by:  As directed   Advance diet as tolerated. Take tums or mylanta as needed for GERD     Increase activity slowly    Complete by:  As directed             Medication List    STOP taking these medications        HYDROcodone-acetaminophen 5-325 MG per tablet  Commonly known as:  NORCO/VICODIN      TAKE these medications        acetaminophen 500 MG tablet  Commonly known as:  TYLENOL  Take 1,000 mg by mouth every 6 (six) hours as needed for pain.     CENTRUM SILVER PO  Take 1 tablet by mouth every morning.     cetirizine 10 MG tablet   Commonly known as:  ZYRTEC  Take 10 mg by mouth daily.     Eszopiclone 3 MG Tabs  Take 1.5 mg by mouth at bedtime.     flecainide 100 MG tablet  Commonly known as:  TAMBOCOR  Take 100 mg by mouth 2 (two) times daily.     fluticasone 50 MCG/ACT nasal spray  Commonly known as:  FLONASE  Place 1 spray into both nostrils 2 (two) times daily.     GAS-X PO  Take 1 tablet by mouth 3 (three) times daily as needed (for gas).     losartan 50 MG tablet  Commonly known as:  COZAAR  Take 50 mg by mouth every morning.     metoprolol succinate 25 MG 24 hr tablet  Commonly known as:  TOPROL-XL  Take 25 mg by mouth every morning.     omeprazole 20 MG capsule  Commonly known as:  PRILOSEC  Take 20 mg by mouth every morning.     ondansetron 4 MG tablet  Commonly known as:  ZOFRAN  Take 1 tablet (4 mg total) by mouth 2 (two) times daily.     oxyCODONE-acetaminophen 5-325 MG per tablet  Commonly known as:  PERCOCET/ROXICET  Take 1-2 tablets by mouth every 4 (four)  hours as needed for moderate pain.     rivaroxaban 20 MG Tabs tablet  Commonly known as:  XARELTO  Take 20 mg by mouth at bedtime.     vitamin C 500 MG tablet  Commonly known as:  ASCORBIC ACID  Take 500 mg by mouth every morning.           Follow-up Information    Follow up with [facility], MD In 2 weeks.   Specialty:  General Surgery   Contact information:   [address] [place] [address] [phone]       Signed: Pedro Earls 09/10/2014, 8:54 AM

## 2014-09-12 LAB — GLUCOSE, CAPILLARY: Glucose-Capillary: 97 mg/dL (ref 70–99)

## 2014-09-13 ENCOUNTER — Telehealth: Payer: Self-pay | Admitting: Internal Medicine

## 2014-09-13 MED ORDER — OMEPRAZOLE 20 MG PO CPDR: 20.0000 mg | DELAYED_RELEASE_CAPSULE | Freq: Two times a day (BID) | ORAL | Status: DC

## 2014-09-13 NOTE — Telephone Encounter (Signed)
OK to send Prilosec 20mg  #60, 1 po bid, 6 refills

## 2014-09-13 NOTE — Telephone Encounter (Signed)
Dr Brodie-please advise 

## 2014-09-13 NOTE — Telephone Encounter (Signed)
Rx for twice daily prilosec sent to pharmacy. Patient advised.

## 2014-09-14 ENCOUNTER — Other Ambulatory Visit (INDEPENDENT_AMBULATORY_CARE_PROVIDER_SITE_OTHER): Payer: Self-pay | Admitting: Surgery

## 2014-09-14 ENCOUNTER — Inpatient Hospital Stay (HOSPITAL_COMMUNITY)
Admission: AD | Admit: 2014-09-14 | Discharge: 2014-09-26 | DRG: 388 | Disposition: A | Discharge status: Home / Self Care | Payer: Medicare Other | Source: Ambulatory Visit | Attending: General Surgery | Admitting: General Surgery

## 2014-09-14 ENCOUNTER — Inpatient Hospital Stay (HOSPITAL_COMMUNITY): Payer: Medicare Other

## 2014-09-14 ENCOUNTER — Other Ambulatory Visit (HOSPITAL_COMMUNITY): Payer: Self-pay | Admitting: *Deleted

## 2014-09-14 ENCOUNTER — Encounter (HOSPITAL_COMMUNITY): Payer: Self-pay | Admitting: General Practice

## 2014-09-14 DIAGNOSIS — Z79899 Other long term (current) drug therapy: Secondary | ICD-10-CM

## 2014-09-14 DIAGNOSIS — K567 Ileus, unspecified: Principal | ICD-10-CM | POA: Diagnosis present

## 2014-09-14 DIAGNOSIS — D649 Anemia, unspecified: Secondary | ICD-10-CM

## 2014-09-14 DIAGNOSIS — I10 Essential (primary) hypertension: Secondary | ICD-10-CM | POA: Diagnosis present

## 2014-09-14 DIAGNOSIS — Z6830 Body mass index (BMI) 30.0-30.9, adult: Secondary | ICD-10-CM | POA: Diagnosis not present

## 2014-09-14 DIAGNOSIS — K3184 Gastroparesis: Secondary | ICD-10-CM | POA: Diagnosis present

## 2014-09-14 DIAGNOSIS — R739 Hyperglycemia, unspecified: Secondary | ICD-10-CM

## 2014-09-14 DIAGNOSIS — I2699 Other pulmonary embolism without acute cor pulmonale: Secondary | ICD-10-CM | POA: Insufficient documentation

## 2014-09-14 DIAGNOSIS — R112 Nausea with vomiting, unspecified: Secondary | ICD-10-CM | POA: Diagnosis present

## 2014-09-14 DIAGNOSIS — C250 Malignant neoplasm of head of pancreas: Secondary | ICD-10-CM | POA: Diagnosis present

## 2014-09-14 DIAGNOSIS — Z79891 Long term (current) use of opiate analgesic: Secondary | ICD-10-CM

## 2014-09-14 DIAGNOSIS — Z981 Arthrodesis status: Secondary | ICD-10-CM | POA: Diagnosis not present

## 2014-09-14 DIAGNOSIS — I480 Paroxysmal atrial fibrillation: Secondary | ICD-10-CM | POA: Diagnosis present

## 2014-09-14 DIAGNOSIS — E43 Unspecified severe protein-calorie malnutrition: Secondary | ICD-10-CM | POA: Diagnosis present

## 2014-09-14 DIAGNOSIS — E669 Obesity, unspecified: Secondary | ICD-10-CM | POA: Diagnosis present

## 2014-09-14 DIAGNOSIS — R1110 Vomiting, unspecified: Secondary | ICD-10-CM

## 2014-09-14 DIAGNOSIS — Z90710 Acquired absence of both cervix and uterus: Secondary | ICD-10-CM | POA: Diagnosis not present

## 2014-09-14 DIAGNOSIS — Z6831 Body mass index (BMI) 31.0-31.9, adult: Secondary | ICD-10-CM

## 2014-09-14 DIAGNOSIS — Z90411 Acquired partial absence of pancreas: Secondary | ICD-10-CM | POA: Diagnosis not present

## 2014-09-14 DIAGNOSIS — E46 Unspecified protein-calorie malnutrition: Secondary | ICD-10-CM | POA: Diagnosis not present

## 2014-09-14 DIAGNOSIS — Z96651 Presence of right artificial knee joint: Secondary | ICD-10-CM | POA: Diagnosis not present

## 2014-09-14 DIAGNOSIS — Z9889 Other specified postprocedural states: Secondary | ICD-10-CM

## 2014-09-14 DIAGNOSIS — F419 Anxiety disorder, unspecified: Secondary | ICD-10-CM | POA: Diagnosis present

## 2014-09-14 DIAGNOSIS — R1114 Bilious vomiting: Secondary | ICD-10-CM

## 2014-09-14 DIAGNOSIS — C259 Malignant neoplasm of pancreas, unspecified: Secondary | ICD-10-CM | POA: Diagnosis present

## 2014-09-14 DIAGNOSIS — Z885 Allergy status to narcotic agent status: Secondary | ICD-10-CM | POA: Diagnosis not present

## 2014-09-14 HISTORY — DX: Other pulmonary embolism without acute cor pulmonale: I26.99

## 2014-09-14 HISTORY — DX: Malignant neoplasm of pancreas, unspecified: C25.9

## 2014-09-14 HISTORY — DX: Personal history of other medical treatment: Z92.89

## 2014-09-14 HISTORY — DX: Unspecified severe protein-calorie malnutrition: E43

## 2014-09-14 LAB — CBC
HCT: 27 % — ABNORMAL LOW (ref 36.0–46.0)
Hemoglobin: 8.8 g/dL — ABNORMAL LOW (ref 12.0–15.0)
MCH: 28.2 pg (ref 26.0–34.0)
MCHC: 32.6 g/dL (ref 30.0–36.0)
MCV: 86.5 fL (ref 78.0–100.0)
PLATELETS: 370 10*3/uL (ref 150–400)
RBC: 3.12 MIL/uL — ABNORMAL LOW (ref 3.87–5.11)
RDW: 14.5 % (ref 11.5–15.5)
WBC: 8.6 10*3/uL (ref 4.0–10.5)

## 2014-09-14 LAB — BASIC METABOLIC PANEL
Anion gap: 13 (ref 5–15)
BUN: 13 mg/dL (ref 6–23)
CALCIUM: 8.4 mg/dL (ref 8.4–10.5)
CO2: 22 mmol/L (ref 19–32)
CREATININE: 0.76 mg/dL (ref 0.50–1.10)
Chloride: 102 mEq/L (ref 96–112)
GFR, EST NON AFRICAN AMERICAN: 85 mL/min — AB (ref >90)
GLUCOSE: 88 mg/dL (ref 70–99)
Potassium: 4 mmol/L (ref 3.5–5.1)
Sodium: 137 mmol/L (ref 135–145)

## 2014-09-14 MED ORDER — ONDANSETRON HCL 4 MG/2ML IJ SOLN: 4.0000 mg | Freq: Four times a day (QID) | INTRAMUSCULAR | Status: DC | PRN

## 2014-09-14 MED ORDER — PANTOPRAZOLE SODIUM 40 MG IV SOLR: 40.0000 mg | Freq: Every day | INTRAVENOUS | Status: DC

## 2014-09-14 MED ORDER — ENOXAPARIN SODIUM 40 MG/0.4ML ~~LOC~~ SOLN
40.0000 mg | SUBCUTANEOUS | Status: DC
  Administered 2014-09-14 – 2014-09-15 (×2): 40 mg via SUBCUTANEOUS
  Filled 2014-09-14 (×4): qty 0.4

## 2014-09-14 MED ORDER — MORPHINE SULFATE 2 MG/ML IJ SOLN
INTRAMUSCULAR | Status: AC
  Administered 2014-09-14: 2 mg via INTRAMUSCULAR
  Filled 2014-09-14: qty 1

## 2014-09-14 MED ORDER — POTASSIUM CHLORIDE IN NACL 20-0.9 MEQ/L-% IV SOLN: INTRAVENOUS | Status: DC

## 2014-09-14 MED ORDER — POTASSIUM CHLORIDE IN NACL 20-0.9 MEQ/L-% IV SOLN
INTRAVENOUS | Status: AC
  Administered 2014-09-14 – 2014-09-15 (×2): via INTRAVENOUS
  Filled 2014-09-14 (×5): qty 1000

## 2014-09-14 MED ORDER — MORPHINE SULFATE 4 MG/ML IJ SOLN: 1.0000 mg | INTRAMUSCULAR | Status: DC | PRN

## 2014-09-14 MED ORDER — SODIUM CHLORIDE 0.9 % IV BOLUS (SEPSIS): 1000.0000 mL | Freq: Once | INTRAVENOUS | Status: AC

## 2014-09-14 MED ORDER — PNEUMOCOCCAL VAC POLYVALENT 25 MCG/0.5ML IJ INJ
0.5000 mL | INJECTION | INTRAMUSCULAR | Status: DC
  Administered 2014-09-15: 0.5 mL via INTRAMUSCULAR

## 2014-09-14 MED ORDER — DIPHENHYDRAMINE HCL 50 MG/ML IJ SOLN
12.5000 mg | Freq: Once | INTRAMUSCULAR | Status: AC
  Administered 2014-09-14: 12.5 mg via INTRAVENOUS
  Filled 2014-09-14: qty 1

## 2014-09-14 MED ORDER — ONDANSETRON HCL 4 MG/2ML IJ SOLN
4.0000 mg | Freq: Four times a day (QID) | INTRAMUSCULAR | Status: DC | PRN
  Administered 2014-09-21 – 2014-09-22 (×3): 4 mg via INTRAVENOUS
  Filled 2014-09-14 (×3): qty 2

## 2014-09-14 MED ORDER — MORPHINE SULFATE 2 MG/ML IJ SOLN
1.0000 mg | INTRAMUSCULAR | Status: DC | PRN
  Administered 2014-09-14: 2 mg via INTRAVENOUS
  Administered 2014-09-15: 4 mg via INTRAVENOUS
  Administered 2014-09-15: 2 mg via INTRAVENOUS
  Filled 2014-09-14: qty 1
  Filled 2014-09-14: qty 2
  Filled 2014-09-14: qty 1

## 2014-09-14 MED ORDER — ENSURE COMPLETE PO LIQD: 237.0000 mL | Freq: Two times a day (BID) | ORAL | Status: DC

## 2014-09-14 MED ORDER — ONDANSETRON HCL 4 MG/2ML IJ SOLN
INTRAMUSCULAR | Status: AC
  Administered 2014-09-14: 4 mg
  Filled 2014-09-14: qty 2

## 2014-09-14 MED ORDER — ONDANSETRON HCL 4 MG/2ML IJ SOLN: 4.0000 mg | Freq: Four times a day (QID) | INTRAMUSCULAR | Status: DC

## 2014-09-14 MED ORDER — SODIUM CHLORIDE 0.9 % IV SOLN
INTRAVENOUS | Status: DC
  Administered 2014-09-14: 21:00:00 via INTRAVENOUS

## 2014-09-14 MED ORDER — ENOXAPARIN SODIUM 150 MG/ML ~~LOC~~ SOLN: 40.0000 mg | SUBCUTANEOUS | Status: DC

## 2014-09-14 MED ORDER — PANTOPRAZOLE SODIUM 40 MG IV SOLR
40.0000 mg | Freq: Every day | INTRAVENOUS | Status: DC
  Administered 2014-09-14 – 2014-09-24 (×11): 40 mg via INTRAVENOUS
  Filled 2014-09-14 (×13): qty 40
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
## 2014-09-15 DIAGNOSIS — K567 Ileus, unspecified: Secondary | ICD-10-CM | POA: Diagnosis not present

## 2014-09-15 LAB — COMPREHENSIVE METABOLIC PANEL
ALBUMIN: 2.3 g/dL — AB (ref 3.5–5.2)
ALT: 12 U/L (ref 0–35)
AST: 18 U/L (ref 0–37)
Alkaline Phosphatase: 51 U/L (ref 39–117)
Anion gap: 9 (ref 5–15)
BILIRUBIN TOTAL: 0.6 mg/dL (ref 0.3–1.2)
BUN: 14 mg/dL (ref 6–23)
CO2: 23 mmol/L (ref 19–32)
CREATININE: 0.71 mg/dL (ref 0.50–1.10)
Calcium: 7.9 mg/dL — ABNORMAL LOW (ref 8.4–10.5)
Chloride: 105 mEq/L (ref 96–112)
GFR calc Af Amer: >90 mL/min (ref >90)
GFR calc non Af Amer: 87 mL/min — ABNORMAL LOW (ref >90)
GLUCOSE: 75 mg/dL (ref 70–99)
Potassium: 4.3 mmol/L (ref 3.5–5.1)
Sodium: 137 mmol/L (ref 135–145)
Total Protein: 4.7 g/dL — ABNORMAL LOW (ref 6.0–8.3)

## 2014-09-15 LAB — GLUCOSE, CAPILLARY
GLUCOSE-CAPILLARY: 169 mg/dL — AB (ref 70–99)
Glucose-Capillary: 158 mg/dL — ABNORMAL HIGH (ref 70–99)

## 2014-09-15 LAB — PREALBUMIN: Prealbumin: 9.6 mg/dL — ABNORMAL LOW (ref 17.0–34.0)

## 2014-09-15 MED ORDER — INSULIN ASPART 100 UNIT/ML ~~LOC~~ SOLN
0.0000 [IU] | SUBCUTANEOUS | Status: DC
  Administered 2014-09-15 – 2014-09-16 (×4): 2 [IU] via SUBCUTANEOUS

## 2014-09-15 MED ORDER — TRACE MINERALS CR-CU-F-FE-I-MN-MO-SE-ZN IV SOLN
INTRAVENOUS | Status: AC
  Administered 2014-09-15: 17:00:00 via INTRAVENOUS
  Filled 2014-09-15: qty 1000

## 2014-09-15 MED ORDER — KCL IN DEXTROSE-NACL 20-5-0.9 MEQ/L-%-% IV SOLN
INTRAVENOUS | Status: AC
  Administered 2014-09-15 – 2014-09-16 (×2): via INTRAVENOUS
  Filled 2014-09-15 (×3): qty 1000

## 2014-09-15 MED ORDER — ZOLPIDEM TARTRATE 5 MG PO TABS
5.0000 mg | ORAL_TABLET | Freq: Every evening | ORAL | Status: DC | PRN
  Administered 2014-09-15 – 2014-09-20 (×3): 5 mg via ORAL
  Filled 2014-09-15 (×5): qty 1

## 2014-09-15 MED ORDER — FAT EMULSION 20 % IV EMUL
250.0000 mL | INTRAVENOUS | Status: AC
  Administered 2014-09-15: 250 mL via INTRAVENOUS
  Filled 2014-09-15: qty 250

## 2014-09-15 MED ORDER — SODIUM CHLORIDE 0.9 % IJ SOLN
10.0000 mL | INTRAMUSCULAR | Status: DC | PRN
  Administered 2014-09-16 – 2014-09-17 (×2): 10 mL
  Administered 2014-09-18: 20 mL
  Administered 2014-09-21 – 2014-09-25 (×4): 10 mL
  Administered 2014-09-26: 20 mL
  Filled 2014-09-15 (×8): qty 40
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
## 2014-09-15 NOTE — Progress Notes (Signed)
UR completed.  Izaya Netherton, RN BSN MHA CCM Trauma/Neuro ICU Case Manager [phone]  

## 2014-09-15 NOTE — Progress Notes (Signed)
[facility] Progress Note     Subjective: Pt's abdominal distension, N/V resolved since NG placed.  Daughter and husband at bedside.  NG tube irritating her.  Wants to ambulate OOB.    Objective: Vital signs in last 24 hours: Temp:  [98.1 F (36.7 C)-98.7 F (37.1 C)] 98.1 F (36.7 C) (12/24 0436) Pulse Rate:  [62-81] 62 (12/24 0436) Resp:  [16-18] 16 (12/24 0436) BP: (104-113)/(48-67) 113/48 mmHg (12/24 0436) SpO2:  [95 %-100 %] 95 % (12/24 0436) Weight:  [178 lb (80.74 kg)] 178 lb (80.74 kg) (12/23 1600) Last BM Date: 09/13/14  Intake/Output from previous day: 12/23 0701 - 12/24 0700 In: 834.2 [I.V.:804.2; NG/GT:30] Out: 1800 [Urine:250; Emesis/NG output:1550] Intake/Output this shift:    PE: Gen:  Alert, NAD, pleasant Abd: Soft, ND, tender over midline and drain site, +BS, no HSM, incisions C/D/I with steristrips in place  Lab Results:   Recent Labs  09/14/14 2253  WBC 8.6  HGB 8.8*  HCT 27.0*  PLT 370   BMET  Recent Labs  09/14/14 2253  NA 137  K 4.0  CL 102  CO2 22  GLUCOSE 88  BUN 13  CREATININE 0.76  CALCIUM 8.4   PT/INR No results for input(s): LABPROT, INR in the last 72 hours. CMP     Component Value Date/Time   NA 137 09/14/2014 2253   K 4.0 09/14/2014 2253   CL 102 09/14/2014 2253   CO2 22 09/14/2014 2253   GLUCOSE 88 09/14/2014 2253   BUN 13 09/14/2014 2253   CREATININE 0.76 09/14/2014 2253   CALCIUM 8.4 09/14/2014 2253   PROT 5.5* 09/07/2014 0513   ALBUMIN 2.5* 09/07/2014 0513   AST 21 09/07/2014 0513   ALT 26 09/07/2014 0513   ALKPHOS 61 09/07/2014 0513   BILITOT 0.4 09/07/2014 0513   GFRNONAA 85* 09/14/2014 2253   GFRAA >90 09/14/2014 2253   Lipase     Component Value Date/Time   LIPASE 79.0* 07/11/2014 1043       Studies/Results: Dg Abd Portable 2v  09/15/2014   CLINICAL DATA:  Vomiting, nasogastric tube placement  EXAM: PORTABLE ABDOMEN - 2 VIEW  COMPARISON:  09/02/2014  FINDINGS: Nasogastric tube  terminates over the gastric body expected location. Suture line is present over the left mid abdomen. No dilated loop of large or small gas-filled bowel. Clips are noted over the mid abdomen. No acute osseous finding. No free air identified.  IMPRESSION: Nasogastric tube tip terminates over the expected location of the body of the stomach.   Electronically Signed   By: Conchita Paris M.D.   On: 09/15/2014 01:37    Anti-infectives: Anti-infectives    None       Assessment/Plan Post op nausea and vomiting POD #16 s/p Whipple, placement of pancreatic stent - Dr. Barry Dienes for pancreatic cancer Likely functional gastric emptying problem as opposed to actually mechanical obstruction PCM - start tpn  Plan: 1.  IVF, NPO, Continue NG tube as decompression, pain control, antiemetics 2.  Insert PICC and start TPN 3.  Consider upper GI if not improving in the next few days 4.  Ambulate and IS 5.  SCD's and lovenox    LOS: 1 day    Coralie Keens 09/15/2014, 9:14 AM Pager: [phone]

## 2014-09-15 NOTE — Progress Notes (Signed)
Peripherally Inserted Central Catheter/Midline Placement  The IV Nurse has discussed with the patient and/or persons authorized to consent for the patient, the purpose of this procedure and the potential benefits and risks involved with this procedure.  The benefits include less needle sticks, lab draws from the catheter and patient may be discharged home with the catheter.  Risks include, but not limited to, infection, bleeding, blood clot (thrombus formation), and puncture of an artery; nerve damage and irregular heat beat.  Alternatives to this procedure were also discussed.  PICC/Midline Placement Documentation        [name] 09/15/2014, 12:11 PM

## 2014-09-16 ENCOUNTER — Encounter (HOSPITAL_COMMUNITY): Payer: Self-pay | Admitting: Radiology

## 2014-09-16 ENCOUNTER — Inpatient Hospital Stay (HOSPITAL_COMMUNITY): Payer: Medicare Other

## 2014-09-16 DIAGNOSIS — E43 Unspecified severe protein-calorie malnutrition: Secondary | ICD-10-CM | POA: Insufficient documentation

## 2014-09-16 LAB — PHOSPHORUS: Phosphorus: 3 mg/dL (ref 2.3–4.6)

## 2014-09-16 LAB — COMPREHENSIVE METABOLIC PANEL
ALBUMIN: 2.3 g/dL — AB (ref 3.5–5.2)
ALK PHOS: 50 U/L (ref 39–117)
ALT: 14 U/L (ref 0–35)
AST: 19 U/L (ref 0–37)
Anion gap: 3 — ABNORMAL LOW (ref 5–15)
BUN: 9 mg/dL (ref 6–23)
CO2: 28 mmol/L (ref 19–32)
Calcium: 7.9 mg/dL — ABNORMAL LOW (ref 8.4–10.5)
Chloride: 103 mEq/L (ref 96–112)
Creatinine, Ser: 0.59 mg/dL (ref 0.50–1.10)
GFR calc Af Amer: >90 mL/min (ref >90)
Glucose, Bld: 184 mg/dL — ABNORMAL HIGH (ref 70–99)
Potassium: 3.6 mmol/L (ref 3.5–5.1)
Sodium: 134 mmol/L — ABNORMAL LOW (ref 135–145)
Total Bilirubin: 0.3 mg/dL (ref 0.3–1.2)
Total Protein: 4.8 g/dL — ABNORMAL LOW (ref 6.0–8.3)

## 2014-09-16 LAB — CBC
HCT: 25.6 % — ABNORMAL LOW (ref 36.0–46.0)
Hemoglobin: 8.4 g/dL — ABNORMAL LOW (ref 12.0–15.0)
MCH: 28.6 pg (ref 26.0–34.0)
MCHC: 32.8 g/dL (ref 30.0–36.0)
MCV: 87.1 fL (ref 78.0–100.0)
PLATELETS: 329 10*3/uL (ref 150–400)
RBC: 2.94 MIL/uL — AB (ref 3.87–5.11)
RDW: 14.4 % (ref 11.5–15.5)
WBC: 6.7 10*3/uL (ref 4.0–10.5)

## 2014-09-16 LAB — GLUCOSE, CAPILLARY
GLUCOSE-CAPILLARY: 150 mg/dL — AB (ref 70–99)
Glucose-Capillary: 110 mg/dL — ABNORMAL HIGH (ref 70–99)
Glucose-Capillary: 143 mg/dL — ABNORMAL HIGH (ref 70–99)
Glucose-Capillary: 165 mg/dL — ABNORMAL HIGH (ref 70–99)
Glucose-Capillary: 166 mg/dL — ABNORMAL HIGH (ref 70–99)
Glucose-Capillary: 176 mg/dL — ABNORMAL HIGH (ref 70–99)

## 2014-09-16 LAB — DIFFERENTIAL
BASOS ABS: 0 10*3/uL (ref 0.0–0.1)
Basophils Relative: 0 % (ref 0–1)
EOS ABS: 0.7 10*3/uL (ref 0.0–0.7)
Eosinophils Relative: 11 % — ABNORMAL HIGH (ref 0–5)
Lymphocytes Relative: 16 % (ref 12–46)
Lymphs Abs: 1 10*3/uL (ref 0.7–4.0)
MONOS PCT: 8 % (ref 3–12)
Monocytes Absolute: 0.5 10*3/uL (ref 0.1–1.0)
Neutro Abs: 4.4 10*3/uL (ref 1.7–7.7)
Neutrophils Relative %: 65 % (ref 43–77)

## 2014-09-16 LAB — MAGNESIUM: Magnesium: 1.7 mg/dL (ref 1.5–2.5)

## 2014-09-16 LAB — PREALBUMIN: Prealbumin: 8.6 mg/dL — ABNORMAL LOW (ref 17.0–34.0)

## 2014-09-16 LAB — TRIGLYCERIDES: TRIGLYCERIDES: 79 mg/dL (ref <150)

## 2014-09-16 MED ORDER — LORAZEPAM 2 MG/ML IJ SOLN
INTRAMUSCULAR | Status: AC
  Administered 2014-09-16: 1 mg
  Filled 2014-09-16: qty 1

## 2014-09-16 MED ORDER — IOHEXOL 300 MG/ML  SOLN
25.0000 mL | INTRAMUSCULAR | Status: AC
  Administered 2014-09-16 (×2): 25 mL via ORAL

## 2014-09-16 MED ORDER — LORAZEPAM BOLUS VIA INFUSION: 0.5000 mg | Freq: Four times a day (QID) | INTRAVENOUS | Status: DC | PRN

## 2014-09-16 MED ORDER — MAGNESIUM SULFATE 2 GM/50ML IV SOLN
2.0000 g | Freq: Once | INTRAVENOUS | Status: AC
  Administered 2014-09-16: 2 g via INTRAVENOUS
  Filled 2014-09-16: qty 50

## 2014-09-16 MED ORDER — KCL IN DEXTROSE-NACL 20-5-0.9 MEQ/L-%-% IV SOLN
INTRAVENOUS | Status: AC
  Administered 2014-09-16: 22:00:00 via INTRAVENOUS
  Filled 2014-09-16 (×2): qty 1000

## 2014-09-16 MED ORDER — IOHEXOL 300 MG/ML  SOLN
100.0000 mL | Freq: Once | INTRAMUSCULAR | Status: AC | PRN
  Administered 2014-09-16: 100 mL via INTRAVENOUS

## 2014-09-16 MED ORDER — LORAZEPAM 2 MG/ML IJ SOLN
0.5000 mg | Freq: Four times a day (QID) | INTRAMUSCULAR | Status: DC | PRN
  Administered 2014-09-16 – 2014-09-20 (×10): 1 mg via INTRAVENOUS
  Administered 2014-09-21: 0.5 mg via INTRAVENOUS
  Administered 2014-09-21 – 2014-09-25 (×9): 1 mg via INTRAVENOUS
  Filled 2014-09-16 (×23): qty 1

## 2014-09-16 MED ORDER — POTASSIUM CHLORIDE 10 MEQ/50ML IV SOLN
10.0000 meq | INTRAVENOUS | Status: AC
  Administered 2014-09-16 (×4): 10 meq via INTRAVENOUS
  Filled 2014-09-16 (×4): qty 50

## 2014-09-16 MED ORDER — TRACE MINERALS CR-CU-F-FE-I-MN-MO-SE-ZN IV SOLN
INTRAVENOUS | Status: AC
  Administered 2014-09-16: 18:00:00 via INTRAVENOUS
  Filled 2014-09-16: qty 2000

## 2014-09-16 MED ORDER — ENOXAPARIN SODIUM 80 MG/0.8ML ~~LOC~~ SOLN
80.0000 mg | Freq: Two times a day (BID) | SUBCUTANEOUS | Status: DC
  Administered 2014-09-16 – 2014-09-23 (×15): 80 mg via SUBCUTANEOUS
  Filled 2014-09-16 (×18): qty 0.8

## 2014-09-16 MED ORDER — FAT EMULSION 20 % IV EMUL
250.0000 mL | INTRAVENOUS | Status: AC
  Administered 2014-09-16: 250 mL via INTRAVENOUS
  Filled 2014-09-16: qty 250

## 2014-09-16 MED ORDER — INSULIN ASPART 100 UNIT/ML ~~LOC~~ SOLN
0.0000 [IU] | Freq: Four times a day (QID) | SUBCUTANEOUS | Status: DC
  Administered 2014-09-16: 1 [IU] via SUBCUTANEOUS
  Administered 2014-09-17 – 2014-09-18 (×6): 2 [IU] via SUBCUTANEOUS
  Administered 2014-09-18: 1 [IU] via SUBCUTANEOUS
  Administered 2014-09-19: 2 [IU] via SUBCUTANEOUS
  Administered 2014-09-19 (×2): 1 [IU] via SUBCUTANEOUS
  Administered 2014-09-20: 2 [IU] via SUBCUTANEOUS
  Administered 2014-09-20 – 2014-09-21 (×2): 1 [IU] via SUBCUTANEOUS
  Administered 2014-09-21 (×2): 2 [IU] via SUBCUTANEOUS
  Administered 2014-09-22 (×2): 1 [IU] via SUBCUTANEOUS
  Administered 2014-09-22: 2 [IU] via SUBCUTANEOUS
  Administered 2014-09-22 – 2014-09-23 (×4): 1 [IU] via SUBCUTANEOUS
  Administered 2014-09-23: 2 [IU] via SUBCUTANEOUS
  Administered 2014-09-24: 1 [IU] via SUBCUTANEOUS
  Administered 2014-09-24: 2 [IU] via SUBCUTANEOUS
  Administered 2014-09-24 – 2014-09-25 (×2): 1 [IU] via SUBCUTANEOUS
  Administered 2014-09-25: 2 [IU] via SUBCUTANEOUS
  Administered 2014-09-25: 1 [IU] via SUBCUTANEOUS

## 2014-09-16 NOTE — Progress Notes (Signed)
Pt CT Scan result relayed to Dr Ninfa Linden.

## 2014-09-16 NOTE — Progress Notes (Signed)
PARENTERAL NUTRITION CONSULT NOTE - Follow-up  Pharmacy Consult for TPN Indication: Gastric emptying issues/Ileus with poor intake s/p Whipple Procedure   Allergies  Allergen Reactions  . Codeine Other (See Comments)    Stomach pain  . Morphine And Related Other (See Comments)    Severe headache. Pt has not had since 90s and is ok with taking morphine.  . Sulfa Antibiotics Hives    Patient Measurements: Height: 5\' 3"  (160 cm) Weight: 178 lb (80.74 kg) IBW/kg (Calculated) : 52.4 Adjusted Body Weight: 60 kg Usual Weight: 97 kg (11/2013)  Vital Signs: Temp: 98.3 F (36.8 C) (12/25 0445) Temp Source: Oral (12/25 0445) BP: 140/65 mmHg (12/25 0445) Pulse Rate: 64 (12/25 0445) Intake/Output from previous day: 12/24 0701 - 12/25 0700 In: 818.3 [P.O.:180; TPN:638.3] Out: 2800 [Urine:1400; Emesis/NG output:1400] Intake/Output from this shift:    Labs:  Recent Labs  09/14/14 2253 09/16/14 0500  WBC 8.6 6.7  HGB 8.8* 8.4*  HCT 27.0* 25.6*  PLT 370 329     Recent Labs  09/14/14 2253 09/15/14 1105 09/16/14 0500 09/16/14 0545  NA 137 137 134*  --   K 4.0 4.3 3.6  --   CL 102 105 103  --   CO2 22 23 28   --   GLUCOSE 88 75 184*  --   BUN 13 14 9   --   CREATININE 0.76 0.71 0.59  --   CALCIUM 8.4 7.9* 7.9*  --   MG  --   --  1.7  --   PHOS  --   --  3.0  --   PROT  --  4.7* 4.8*  --   ALBUMIN  --  2.3* 2.3*  --   AST  --  18 19  --   ALT  --  12 14  --   ALKPHOS  --  51 50  --   BILITOT  --  0.6 0.3  --   PREALBUMIN  --  9.6*  --   --   TRIG  --   --   --  79   Estimated Creatinine Clearance: 68.6 mL/min (by C-G formula based on Cr of 0.59).    Recent Labs  09/15/14 2347 09/16/14 0442 09/16/14 0734  GLUCAP 169* 176* 165*    Insulin Requirements in the past 24 hours:  8 units Novolog SSI  Current Nutrition:  Clinimix E 5/15 at 75ml/hr and 20% lipids at 10 ml/hr provides 1162 kcal and 48 gm protein Goal: Clinimix E 5/15 at 83 ml/hr and 20% lipids at  10 ml/hr to provide 100 gm protein and 1894 kcal  Nutritional Goals: (per RD noted 12/24) Kcal: 1900-2100 Protein: 105-115 grams   Assessment: Admitted with N/V; s/p Whipple procedure for pancreatic cancer 12/8. Since surgery, patient states she has not been eating a full diet as she has not been able to tolerate it.  She describes her intake as chicken noodle soup, toast, scrambled eggs. Pt reports weight loss with her usual body weight of ~215 lbs back in March 2015. Per Epic weight records, pt with a 2.7% weight loss in 1 week.  PICC line placed  GI:  Hx GERD, diverticulosis, chronic gastritis, ischemic colitis. NG output 1400 mL yesterday.  PPI IV. Plan for CT scan with contrast today to see how well stomach is emptying.  Endo:  (-)hx DM. CBGs <190 on sensitive SSI. May need small amount of insulin added to TPN bag.  Lytes:  Na 134, K 3.6 (  goal >4 with possible ileus), Mg 1.7 (goal >2 with possible ileus), Phos 3.  Renal: Cr stable, I/O not charted appropriately. MIVf: D5NS with 22mEq KCl at 80 ml/hr.  Pulm:  RA  Cards:  Hx HTN, Afib. VSS.   Hepatobil:  LFTs wnl. TG 79.  Neuro: To start ativan for anxiety  ID:   Afeb, WBC wnl, on no antibiotics   Best Practices:  Enoxaparin for VTE px  TPN Access:  PICC 12/24  TPN day#:  12/24 >>  Plan: - Increase Clinimix E 5/15 to 23ml/hr and lipids at 68ml/hr. Will advance to goal as tolerated. - Decrease MIVF to 60 ml/hr when new TPN hangs at 1800 - KCl 40mEq/50ml q1h x 4 and Magnesium 2gm IV  - Will add 10 units regular insulin to TPN bag (pt will receive ~7 units daily) and change sensitive SSI and CBGs to q6h - BMET, Mg, Phos in a.m.  Sherlon Handing, PharmD, BCPS Clinical pharmacist, pager [phone] 09/16/2014 9:30 AM
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
## 2014-09-16 NOTE — Progress Notes (Addendum)
  Subjective: Lots of anxiety, tearful, fearful that cancer has returned. I spent a long time discussing this with her and tried to assure her that she was simply having postoperative problems with gastric emptying. TNA started yesterday. Pre-albumin 9.6, consistent with severe protein calorie malnutrition  NG drainage bilious and turbid and enteric. 1400 mL out last 24 hours. WBC 6700. Hemoglobin 8.4. Liver function tests normal.   Objective: Vital signs in last 24 hours: Temp:  [98.3 F (36.8 C)-98.6 F (37 C)] 98.3 F (36.8 C) (12/25 0445) Pulse Rate:  [64-70] 64 (12/25 0445) Resp:  [17-18] 18 (12/25 0445) BP: (118-140)/(61-74) 140/65 mmHg (12/25 0445) SpO2:  [96 %-98 %] 98 % (12/25 0445) Last BM Date: 09/13/14  Intake/Output from previous day: 12/24 0701 - 12/25 0700 In: 818.3 [P.O.:180; TPN:638.3] Out: 2800 [Urine:1400; Emesis/NG output:1400] Intake/Output this shift:    General appearance: Alert. Oriented. Extreme anxiety, tearful and tremulous. Otherwise appropriate and interacts normally. Abdomen soft. Nondistended. Not tender. Hypoactive bowel sounds. All wounds look good.    Lab Results:   Recent Labs  09/14/14 2253 09/16/14 0500  WBC 8.6 6.7  HGB 8.8* 8.4*  HCT 27.0* 25.6*  PLT 370 329   BMET  Recent Labs  09/15/14 1105 09/16/14 0500  NA 137 134*  K 4.3 3.6  CL 105 103  CO2 23 28  GLUCOSE 75 184*  BUN 14 9  CREATININE 0.71 0.59  CALCIUM 7.9* 7.9*   PT/INR No results for input(s): LABPROT, INR in the last 72 hours. ABG No results for input(s): PHART, HCO3 in the last 72 hours.  Invalid input(s): PCO2, PO2  Studies/Results: Dg Abd Portable 2v  09/15/2014   CLINICAL DATA:  Vomiting, nasogastric tube placement  EXAM: PORTABLE ABDOMEN - 2 VIEW  COMPARISON:  09/02/2014  FINDINGS: Nasogastric tube terminates over the gastric body expected location. Suture line is present over the left mid abdomen. No dilated loop of large or small gas-filled  bowel. Clips are noted over the mid abdomen. No acute osseous finding. No free air identified.  IMPRESSION: Nasogastric tube tip terminates over the expected location of the body of the stomach.   Electronically Signed   By: Conchita Paris M.D.   On: 09/15/2014 01:37    Anti-infectives: Anti-infectives    None      Assessment/Plan:  POD #17, Whipple procedure and placement of pancreatic stent for pancreatic cancer  T3,N0. Negative margins.  Readmitted for postop nausea and vomiting. Suspect motility disorder Proceed with CT scan with contrast today. This will give [place] a sense of how well the stomach empties and will rule out abscess.   Severe protein calorie malnutrition. TPN started  Acute anxiety. Ativan ordered  VTE prophylaxis. On Lovenox and SCDs.    LOS: 2 days    Trai Ells M 09/16/2014

## 2014-09-17 ENCOUNTER — Encounter (HOSPITAL_COMMUNITY): Payer: Self-pay | Admitting: Physician Assistant

## 2014-09-17 DIAGNOSIS — D649 Anemia, unspecified: Secondary | ICD-10-CM

## 2014-09-17 DIAGNOSIS — I480 Paroxysmal atrial fibrillation: Secondary | ICD-10-CM

## 2014-09-17 DIAGNOSIS — R739 Hyperglycemia, unspecified: Secondary | ICD-10-CM

## 2014-09-17 DIAGNOSIS — I2699 Other pulmonary embolism without acute cor pulmonale: Secondary | ICD-10-CM

## 2014-09-17 LAB — GLUCOSE, CAPILLARY
GLUCOSE-CAPILLARY: 161 mg/dL — AB (ref 70–99)
GLUCOSE-CAPILLARY: 152 mg/dL — AB (ref 70–99)
Glucose-Capillary: 178 mg/dL — ABNORMAL HIGH (ref 70–99)
Glucose-Capillary: 130 mg/dL — ABNORMAL HIGH (ref 70–99)
Glucose-Capillary: 119 mg/dL — ABNORMAL HIGH (ref 70–99)

## 2014-09-17 LAB — BASIC METABOLIC PANEL
ANION GAP: 6 (ref 5–15)
BUN: 8 mg/dL (ref 6–23)
CHLORIDE: 102 meq/L (ref 96–112)
CO2: 27 mmol/L (ref 19–32)
Calcium: 8.1 mg/dL — ABNORMAL LOW (ref 8.4–10.5)
Creatinine, Ser: 0.59 mg/dL (ref 0.50–1.10)
GFR calc Af Amer: >90 mL/min (ref >90)
GFR calc non Af Amer: >90 mL/min (ref >90)
Glucose, Bld: 140 mg/dL — ABNORMAL HIGH (ref 70–99)
Potassium: 4.2 mmol/L (ref 3.5–5.1)
SODIUM: 135 mmol/L (ref 135–145)

## 2014-09-17 LAB — PHOSPHORUS: PHOSPHORUS: 3.5 mg/dL (ref 2.3–4.6)

## 2014-09-17 LAB — MAGNESIUM: Magnesium: 2 mg/dL (ref 1.5–2.5)

## 2014-09-17 MED ORDER — KCL IN DEXTROSE-NACL 20-5-0.9 MEQ/L-%-% IV SOLN
INTRAVENOUS | Status: DC
  Administered 2014-09-17 – 2014-09-18 (×2): via INTRAVENOUS
  Filled 2014-09-17 (×4): qty 1000

## 2014-09-17 MED ORDER — AMIODARONE HCL IN DEXTROSE 360-4.14 MG/200ML-% IV SOLN
60.0000 mg/h | INTRAVENOUS | Status: AC
  Administered 2014-09-17: 60 mg/h via INTRAVENOUS
  Filled 2014-09-17: qty 200

## 2014-09-17 MED ORDER — AMIODARONE LOAD VIA INFUSION
150.0000 mg | Freq: Once | INTRAVENOUS | Status: AC
  Administered 2014-09-17: 150 mg via INTRAVENOUS
  Filled 2014-09-17: qty 83.34

## 2014-09-17 MED ORDER — METOPROLOL TARTRATE 1 MG/ML IV SOLN
5.0000 mg | Freq: Four times a day (QID) | INTRAVENOUS | Status: DC
  Administered 2014-09-17 – 2014-09-22 (×12): 5 mg via INTRAVENOUS
  Filled 2014-09-17 (×34): qty 5

## 2014-09-17 MED ORDER — FAT EMULSION 20 % IV EMUL
250.0000 mL | INTRAVENOUS | Status: AC
  Administered 2014-09-17: 250 mL via INTRAVENOUS
  Filled 2014-09-17: qty 250

## 2014-09-17 MED ORDER — AMIODARONE HCL IN DEXTROSE 360-4.14 MG/200ML-% IV SOLN
30.0000 mg/h | INTRAVENOUS | Status: DC
  Administered 2014-09-18 – 2014-09-24 (×13): 30 mg/h via INTRAVENOUS
  Filled 2014-09-17 (×30): qty 200

## 2014-09-17 MED ORDER — TRACE MINERALS CR-CU-F-FE-I-MN-MO-SE-ZN IV SOLN
INTRAVENOUS | Status: AC
  Administered 2014-09-17: 17:00:00 via INTRAVENOUS
  Filled 2014-09-17: qty 2000

## 2014-09-17 MED ORDER — METOCLOPRAMIDE HCL 5 MG/ML IJ SOLN
5.0000 mg | Freq: Four times a day (QID) | INTRAMUSCULAR | Status: AC
  Administered 2014-09-17 – 2014-09-19 (×12): 5 mg via INTRAVENOUS
  Filled 2014-09-17 (×8): qty 1
  Filled 2014-09-17: qty 2
  Filled 2014-09-17 (×3): qty 1

## 2014-09-17 NOTE — Progress Notes (Signed)
*  PRELIMINARY RESULTS* Vascular Ultrasound Lower extremity venous duplex has been completed.  Preliminary findings: No evidence of DVT.  Landry Mellow, RDMS, RVT  09/17/2014, 9:10 AM

## 2014-09-17 NOTE — Progress Notes (Signed)
  Patient well known to me from recent admission for Whipple complicated by recurrent AF with RVR. Had been on flecainide as outpatient and initially switched to procainimide in house. I discussed with Dr. Caryl Comes at the time and whole she was NPO we managed her well with IV amio and switched back to flecainide on d/c. Now readmitted with gastroparesis and N/V. Currently in NSR. Also has PE.   We are called about suggestions to maintain NSR while in house. While NPO would recommend amio drip and then can switch back to flecainide prior to d/c. Will need heparin/coumadin for PE and GSU to manage.   Full consult pending.   Ramanda Paules,MD 8:41 AM

## 2014-09-17 NOTE — Progress Notes (Addendum)
Subjective: Stable and alert. Normotensive. Heart rate 71. SPO2 98% on room air. Next line less anxious on Ativan next line CT scan shows small pulmonary embolus, right lower lobe. Started on full dose Lovenox yesterday. CT scan otherwise looks good with only postoperative changes. No abscess. GI contrast goes into small bowel, confirming absence of major obstruction. NG tube drained 1650 mL yesterday. Potassium 4.2. Creatinine 0.59. Because 140. Magnesium 2.0.  Objective: Vital signs in last 24 hours: Temp:  [97.7 F (36.5 C)-99 F (37.2 C)] 99 F (37.2 C) (12/26 6160) Pulse Rate:  [71-82] 71 (12/26 0613) Resp:  [16-17] 16 (12/26 0613) BP: (125-137)/(72-75) 125/75 mmHg (12/26 0613) SpO2:  [10 %-99 %] 99 % (12/26 0613) Last BM Date: 09/13/14  Intake/Output from previous day: 12/25 0701 - 12/26 0700 In: 2566.2 [I.V.:1184; NG/GT:90; TPN:1292.2] Out: 7371 [Emesis/NG GGYIRS:8546] Intake/Output this shift:    General appearance: Alert. Cooperative. No physical distress. Much less agitated. Resp: clear to auscultation bilaterally GI: Abdomen soft. Nontender. Wounds look good except 1 small area of erythema centrally but no drainage. Nondistended. Nontender.  Lab Results:   Recent Labs  09/14/14 2253 09/16/14 0500  WBC 8.6 6.7  HGB 8.8* 8.4*  HCT 27.0* 25.6*  PLT 370 329   BMET  Recent Labs  09/16/14 0500 09/17/14 0440  NA 134* 135  K 3.6 4.2  CL 103 102  CO2 28 27  GLUCOSE 184* 140*  BUN 9 8  CREATININE 0.59 0.59  CALCIUM 7.9* 8.1*   PT/INR No results for input(s): LABPROT, INR in the last 72 hours. ABG No results for input(s): PHART, HCO3 in the last 72 hours.  Invalid input(s): PCO2, PO2  Studies/Results: Ct Abdomen Pelvis W Contrast  09/16/2014   CLINICAL DATA:  Abdominal pain  EXAM: CT ABDOMEN AND PELVIS WITH CONTRAST  TECHNIQUE: Multidetector CT imaging of the abdomen and pelvis was performed using the standard protocol following bolus administration  of intravenous contrast.  CONTRAST:  127mL OMNIPAQUE IOHEXOL 300 MG/ML  SOLN  COMPARISON:  07/14/2014  FINDINGS: There is pulmonary thromboembolism in the right lower lobe pulmonary artery. Tiny right pleural effusion. Tiny left pleural effusion. Mild dependent atelectasis at the lung bases.  Diffuse hepatic steatosis.  NG tube tip in the body of the stomach.  Pancreatic head is been resected. Rule on wide loop has been placed. There is wall thickening of the Roux-en-Y loop extending into the hilum of the liver.  There is no extraluminal bowel gas.  There is no abscess cavity.  Small amount of free fluid in the pelvis and adjacent to the liver. Trace free fluid in the left pericolic gutter.  Kidneys, adrenal glands, spleen are within normal limits.  Postop changes in the anterior abdominal wall  Stable lower thoracic spine.  No new compression deformity.  IMPRESSION: Postoperative changes as described from pancreatic head resection. There is wall thickening of the row 1 wide loop extending to the hilum of the liver. Inflammatory process is not excluded.  There is acute pulmonary thromboembolism in the right lower lobe.  Critical Value/emergent results were called by telephone at the time of interpretation on 09/16/2014 at 8:10 pm to [facility], [place] who verbally acknowledged these results.   Electronically Signed   By: Maryclare Bean M.D.   On: 09/16/2014 20:11    Anti-infectives: Anti-infectives    None      Assessment/Plan:  POD #18, Whipple procedure and placement of pancreatic stent for pancreatic cancer  T3,N0. Negative margins.  Readmitted  for postop nausea and vomiting. CT scan shows no abscess and suggest motility disorder and no evidence of obstruction. Start IV Reglan 5 mg every 6 hours I discussed with patient at [place] together can make her sleepy and to let us know if this happens.  Severe protein calorie malnutrition. TPN started 09/15/2014  Acute pulmonary embolism, right lower  lobe. Asymptomatic. Not suspected.  since NPO on TNA, she will be on Lovenox, full dose per pharmacy consult in the short-term. Eventually she will go on oral anticoagulation. LE venous duplex ordered.  History of paroxysmal atrial fibrillation on beta blockers and flecainide and xarelto at home. Sees [employer]. . We will add IV Lopressor 5 mg every 6 hours  Telemetry. I have called [facility] cardiology and discussed the cardiac medication issues as well as the eventual need for Coumadin monitoring as an outpatient. They have agreed to consult today.  Acute anxiety. Ativan ordered. better      LOS: 3 days    [name] M 09/17/2014

## 2014-09-17 NOTE — Progress Notes (Signed)
PARENTERAL NUTRITION CONSULT NOTE - FOLLOW UP  Pharmacy Consult for TPN Indication: Gastric emptying issues/ileus with poor PO intake s/p Whipple Procedure  Allergies  Allergen Reactions  . Ace Inhibitors     Cough  . Codeine Other (See Comments)    Stomach pain  . Morphine And Related Other (See Comments)    Severe headache. Pt has not had since 90s and is ok with taking morphine.  . Sulfa Antibiotics Hives    Patient Measurements: Height: 5\' 3"  (160 cm) Weight: 178 lb (80.74 kg) IBW/kg (Calculated) : 52.4 Adjusted Body Weight:  Usual Weight:   Vital Signs: Temp: 99 F (37.2 C) (12/26 0613) Temp Source: Oral (12/26 6659) BP: 125/75 mmHg (12/26 9357) Pulse Rate: 71 (12/26 0613) Intake/Output from previous day: 12/25 0701 - 12/26 0700 In: 2686.2 [I.V.:1184; NG/GT:210; TPN:1292.2] Out: 1650 [Emesis/NG output:1650] Intake/Output from this shift: Total I/O In: 30 [NG/GT:30] Out: 601 [Urine:300; Emesis/NG output:300; Stool:1]  Labs:  Recent Labs  09/14/14 2253 09/16/14 0500  WBC 8.6 6.7  HGB 8.8* 8.4*  HCT 27.0* 25.6*  PLT 370 329     Recent Labs  09/15/14 1105 09/16/14 0500 09/16/14 0545 09/17/14 0440  NA 137 134*  --  135  K 4.3 3.6  --  4.2  CL 105 103  --  102  CO2 23 28  --  27  GLUCOSE 75 184*  --  140*  BUN 14 9  --  8  CREATININE 0.71 0.59  --  0.59  CALCIUM 7.9* 7.9*  --  8.1*  MG  --  1.7  --  2.0  PHOS  --  3.0  --  3.5  PROT 4.7* 4.8*  --   --   ALBUMIN 2.3* 2.3*  --   --   AST 18 19  --   --   ALT 12 14  --   --   ALKPHOS 51 50  --   --   BILITOT 0.6 0.3  --   --   PREALBUMIN 9.6* 8.6*  --   --   TRIG  --   --  79  --    Estimated Creatinine Clearance: 68.6 mL/min (by C-G formula based on Cr of 0.59).    Recent Labs  09/16/14 2024 09/16/14 2351 09/17/14 0559  GLUCAP 150* 166* 161*    Medications:  Scheduled:  . amiodarone  150 mg Intravenous Once  . enoxaparin (LOVENOX) injection  80 mg Subcutaneous Q12H  . insulin  aspart  0-9 Units Subcutaneous 4 times per day  . metoCLOPramide (REGLAN) injection  5 mg Intravenous 4 times per day  . metoprolol  5 mg Intravenous 4 times per day  . pantoprazole (PROTONIX) IV  40 mg Intravenous QHS    Insulin Requirements in the past 24 hours:  5 units Sens SSI, 10 units insulin in TPN  Current Nutrition:  Clinimix E 5/15 at 75ml/hr and 20% lipids at 10 ml/hr provides 1502 kcal and 72 gm protein Goal: Clinimix E 5/15 at 83 ml/hr and 20% lipids at 10 ml/hr to provide 100 gm protein and 1894 kcal  Nutritional Goals: (per RD noted 12/24) Kcal: 1900-2100 Protein: 105-115 grams   Assessment: Admitted with N/V; s/p Whipple procedure for pancreatic cancer 12/8. Since surgery, patient states she has not been eating a full diet as she has not been able to tolerate it. She describes her intake as chicken noodle soup, toast, scrambled eggs. Pt reports weight loss with her usual body  weight of ~215 lbs back in March 2015. Per Epic weight records, pt with a 2.7% weight loss in 1 week. PICC line placed  GI: Hx GERD, diverticulosis, chronic gastritis, ischemic colitis. NG output 1650 mL yesterday. Abd NT/ND.  CT (-)obstruction.  PPI IV, adding Metoclopramide.   Endo: (-)hx DM. CBGs 150-166.  Lytes: Na 135, K 4.2 (goal >4 with possible ileus), Mg 2 (goal >2 with possible ileus), Phos 3.5.  Renal: Cr stable, I/O not charted appropriately. MIVf: D5NS with 84mEq KCl at 80 ml/hr.  Pulm: RA  Cards: Hx HTN, Afib. VSS.  Amiodarone drip, Metoprolol IV  AC:  (+)PE, (-)DVT.  Lovenox  Hepatobil: LFTs wnl. TG 79.  Neuro:  Anxiety improved on Ativan  ID: Afeb, WBC wnl, on no antibiotics  Best Practices: Enoxaparin for PE TPN Access: PICC 12/24 TPN day#: 12/24 >>  Plan: - Advance to goal rate- Clinimix E 5/15 at 75ml/hr and lipids at 32ml/hr  - Decrease MIVF to 35 ml/hr when new TPN hangs at 1800 - Inc insulin 12 units/bag (pt will receive ~11 units daily)   - BMET in AM  Gracy Bruins, [facility]

## 2014-09-17 NOTE — Progress Notes (Signed)
  Amiodarone Drug - Drug Interaction Consult Note  Recommendations: 68yo pt with AFib, readmitted with gastroparesis; she is NPO.  She is currently on Metoprolol IV and Amiodarone has been initiated. Amiodarone is metabolized by the cytochrome P450 system and therefore has the potential to cause many drug interactions. Amiodarone has an average plasma half-life of 50 days (range 20 to 100 days).   There is potential for drug interactions to occur several weeks or months after stopping treatment and the onset of drug interactions may be slow after initiating amiodarone.   []  Statins: Increased risk of myopathy. Simvastatin- restrict dose to 20mg  daily. Other statins: counsel patients to report any muscle pain or weakness immediately.  []  Anticoagulants: Amiodarone can increase anticoagulant effect. Consider warfarin dose reduction. Patients should be monitored closely and the dose of anticoagulant altered accordingly, remembering that amiodarone levels take several weeks to stabilize.  []  Antiepileptics: Amiodarone can increase plasma concentration of phenytoin, the dose should be reduced. Note that small changes in phenytoin dose can result in large changes in levels. Monitor patient and counsel on signs of toxicity.  [x]  Beta blockers: increased risk of bradycardia, AV block and myocardial depression. Sotalol - avoid concomitant use.  []   Calcium channel blockers (diltiazem and verapamil): increased risk of bradycardia, AV block and myocardial depression.  []   Cyclosporine: Amiodarone increases levels of cyclosporine. Reduced dose of cyclosporine is recommended.  []  Digoxin dose should be halved when amiodarone is started.  []  Diuretics: increased risk of cardiotoxicity if hypokalemia occurs.  []  Oral hypoglycemic agents (glyburide, glipizide, glimepiride): increased risk of hypoglycemia. Patient's glucose levels should be monitored closely when initiating amiodarone therapy.   []  Drugs  that prolong the QT interval:  Torsades de pointes risk may be increased with concurrent use - avoid if possible.  Monitor QTc, also keep magnesium/potassium WNL if concurrent therapy can't be avoided. Marland Kitchen Antibiotics: e.g. fluoroquinolones, erythromycin. . Antiarrhythmics: e.g. quinidine, procainamide, disopyramide, sotalol. . Antipsychotics: e.g. phenothiazines, haloperidol.  . Lithium, tricyclic antidepressants, and methadone.  Thank You,  Gracy Bruins, PharmD Clinical Pharmacist [facility]

## 2014-09-17 NOTE — Progress Notes (Signed)
Placed on tele,NSR . Report given to RN on 2W, will transport via bed.
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
## 2014-09-18 DIAGNOSIS — I10 Essential (primary) hypertension: Secondary | ICD-10-CM

## 2014-09-18 LAB — BASIC METABOLIC PANEL
Anion gap: 5 (ref 5–15)
BUN: 14 mg/dL (ref 6–23)
CO2: 27 mmol/L (ref 19–32)
Calcium: 8.3 mg/dL — ABNORMAL LOW (ref 8.4–10.5)
Chloride: 101 mEq/L (ref 96–112)
Creatinine, Ser: 0.51 mg/dL (ref 0.50–1.10)
Glucose, Bld: 137 mg/dL — ABNORMAL HIGH (ref 70–99)
POTASSIUM: 4.2 mmol/L (ref 3.5–5.1)
Sodium: 133 mmol/L — ABNORMAL LOW (ref 135–145)

## 2014-09-18 LAB — GLUCOSE, CAPILLARY
GLUCOSE-CAPILLARY: 119 mg/dL — AB (ref 70–99)
GLUCOSE-CAPILLARY: 155 mg/dL — AB (ref 70–99)
GLUCOSE-CAPILLARY: 160 mg/dL — AB (ref 70–99)
Glucose-Capillary: 142 mg/dL — ABNORMAL HIGH (ref 70–99)

## 2014-09-18 MED ORDER — TRACE MINERALS CR-CU-F-FE-I-MN-MO-SE-ZN IV SOLN
INTRAVENOUS | Status: AC
  Administered 2014-09-18: 18:00:00 via INTRAVENOUS
  Filled 2014-09-18: qty 2000

## 2014-09-18 MED ORDER — FAT EMULSION 20 % IV EMUL
250.0000 mL | INTRAVENOUS | Status: AC
  Administered 2014-09-18: 250 mL via INTRAVENOUS
  Filled 2014-09-18: qty 250

## 2014-09-18 NOTE — Progress Notes (Signed)
Subjective:  No CP/SOB. NPO. NGT s/p Whipple with gastroparesis. Maintaining NSR on IV Amio  Objective:  Temp:  [98.1 F (36.7 C)-99.1 F (37.3 C)] 99.1 F (37.3 C) (12/27 0545) Pulse Rate:  [57-75] 57 (12/27 0545) Resp:  [16-18] 16 (12/27 0545) BP: (102-133)/(63-88) 105/76 mmHg (12/27 0545) SpO2:  [98 %] 98 % (12/27 0545) Weight change:   Intake/Output from previous day: 12/26 0701 - 12/27 0700 In: 30 [NG/GT:30] Out: 2702 [Urine:1325; Emesis/NG output:1375; Stool:2]  Intake/Output from this shift:    Physical Exam: General appearance: alert and no distress Neck: no adenopathy, no carotid bruit, no JVD, supple, symmetrical, trachea midline and thyroid not enlarged, symmetric, no tenderness/mass/nodules Lungs: clear to auscultation bilaterally Heart: regular rate and rhythm, S1, S2 normal, no murmur, click, rub or gallop Extremities: extremities normal, atraumatic, no cyanosis or edema  Lab Results: Results for orders placed or performed during the hospital encounter of 09/14/14 (from the past 48 hour(s))  Glucose, capillary     Status: Abnormal   Collection Time: 09/16/14 11:36 AM  Result Value Ref Range   Glucose-Capillary 110 (H) 70 - 99 mg/dL  Glucose, capillary     Status: Abnormal   Collection Time: 09/16/14  3:49 PM  Result Value Ref Range   Glucose-Capillary 143 (H) 70 - 99 mg/dL  Glucose, capillary     Status: Abnormal   Collection Time: 09/16/14  8:24 PM  Result Value Ref Range   Glucose-Capillary 150 (H) 70 - 99 mg/dL  Glucose, capillary     Status: Abnormal   Collection Time: 09/16/14 11:51 PM  Result Value Ref Range   Glucose-Capillary 166 (H) 70 - 99 mg/dL  Basic metabolic panel     Status: Abnormal   Collection Time: 09/17/14  4:40 AM  Result Value Ref Range   Sodium 135 135 - 145 mmol/L    Comment: Please note change in reference range.   Potassium 4.2 3.5 - 5.1 mmol/L    Comment: Please note change in reference range.   Chloride 102 96 -  112 mEq/L   CO2 27 19 - 32 mmol/L   Glucose, Bld 140 (H) 70 - 99 mg/dL   BUN 8 6 - 23 mg/dL   Creatinine, Ser 0.59 0.50 - 1.10 mg/dL   Calcium 8.1 (L) 8.4 - 10.5 mg/dL   GFR calc non Af Amer >90 >90 mL/min   GFR calc Af Amer >90 >90 mL/min    Comment: (NOTE) The eGFR has been calculated using the CKD EPI equation. This calculation has not been validated in all clinical situations. eGFR's persistently <90 mL/min signify possible Chronic Kidney Disease.    Anion gap 6 5 - 15  Magnesium     Status: None   Collection Time: 09/17/14  4:40 AM  Result Value Ref Range   Magnesium 2.0 1.5 - 2.5 mg/dL  Phosphorus     Status: None   Collection Time: 09/17/14  4:40 AM  Result Value Ref Range   Phosphorus 3.5 2.3 - 4.6 mg/dL  Glucose, capillary     Status: Abnormal   Collection Time: 09/17/14  5:59 AM  Result Value Ref Range   Glucose-Capillary 161 (H) 70 - 99 mg/dL  Glucose, capillary     Status: Abnormal   Collection Time: 09/17/14 12:08 PM  Result Value Ref Range   Glucose-Capillary 178 (H) 70 - 99 mg/dL  Glucose, capillary     Status: Abnormal   Collection Time: 09/17/14  4:09 PM  Result Value Ref Range   Glucose-Capillary 130 (H) 70 - 99 mg/dL  Glucose, capillary     Status: Abnormal   Collection Time: 09/17/14  6:21 PM  Result Value Ref Range   Glucose-Capillary 152 (H) 70 - 99 mg/dL  Glucose, capillary     Status: Abnormal   Collection Time: 09/17/14 11:26 PM  Result Value Ref Range   Glucose-Capillary 119 (H) 70 - 99 mg/dL  Basic metabolic panel     Status: Abnormal   Collection Time: 09/18/14  5:55 AM  Result Value Ref Range   Sodium 133 (L) 135 - 145 mmol/L    Comment: Please note change in reference range.   Potassium 4.2 3.5 - 5.1 mmol/L    Comment: Please note change in reference range.   Chloride 101 96 - 112 mEq/L   CO2 27 19 - 32 mmol/L   Glucose, Bld 137 (H) 70 - 99 mg/dL   BUN 14 6 - 23 mg/dL   Creatinine, Ser 0.51 0.50 - 1.10 mg/dL   Calcium 8.3 (L) 8.4 -  10.5 mg/dL   GFR calc non Af Amer >90 >90 mL/min   GFR calc Af Amer >90 >90 mL/min    Comment: (NOTE) The eGFR has been calculated using the CKD EPI equation. This calculation has not been validated in all clinical situations. eGFR's persistently <90 mL/min signify possible Chronic Kidney Disease.    Anion gap 5 5 - 15  Glucose, capillary     Status: Abnormal   Collection Time: 09/18/14  6:43 AM  Result Value Ref Range   Glucose-Capillary 155 (H) 70 - 99 mg/dL    Imaging: Imaging results have been reviewed  Tele: NSR  Assessment/Plan:   1. Active Problems: 2.   PAF-NSR on Flec prior to adm 3.   HTN (hypertension) 4.   Obesity (BMI 30-39.9) 5.   Cancer of head of pancreas-S/P Whipple 08/30/14 6.   Nausea and vomiting 7.   Protein-calorie malnutrition, severe 8.   Pulmonary embolism 9.   Hyperglycemia 10.   Anemia 11.   Time Spent Directly with Patient:  15 minutes  Length of Stay:  LOS: 4 days   Pt maintaining NSR on IV amio. Plan per Dr. Haroldine Laws to transition back to Flecainide once able to take PO. On full dose Lovenox per [facility] for pulm embolus on CT scan. Will follow with you.  Lorretta Harp 09/18/2014, 9:52 AM

## 2014-09-18 NOTE — Progress Notes (Signed)
PARENTERAL NUTRITION CONSULT NOTE - FOLLOW UP  Pharmacy Consult for TPN Indication: Gastric emptying issues/ileus with poor PO intake s/p Whipple Procedure  Allergies  Allergen Reactions  . Ace Inhibitors     Cough  . Codeine Other (See Comments)    Stomach pain  . Morphine And Related Other (See Comments)    Severe headache. Pt has not had since 90s and is ok with taking morphine.  . Sulfa Antibiotics Hives    Patient Measurements: Height: 5\' 3"  (160 cm) Weight: 178 lb (80.74 kg) IBW/kg (Calculated) : 52.4 Adjusted Body Weight:  Usual Weight:   Vital Signs: Temp: 99.1 F (37.3 C) (12/27 0545) Temp Source: Oral (12/27 0545) BP: 105/76 mmHg (12/27 0545) Pulse Rate: 57 (12/27 0545) Intake/Output from previous day: 12/26 0701 - 12/27 0700 In: 30 [NG/GT:30] Out: 2702 [Urine:1325; Emesis/NG output:1375; Stool:2] Intake/Output from this shift:    Labs:  Recent Labs  09/16/14 0500  WBC 6.7  HGB 8.4*  HCT 25.6*  PLT 329     Recent Labs  09/15/14 1105 09/16/14 0500 09/16/14 0545 09/17/14 0440 09/18/14 0555  NA 137 134*  --  135 133*  K 4.3 3.6  --  4.2 4.2  CL 105 103  --  102 101  CO2 23 28  --  27 27  GLUCOSE 75 184*  --  140* 137*  BUN 14 9  --  8 14  CREATININE 0.71 0.59  --  0.59 0.51  CALCIUM 7.9* 7.9*  --  8.1* 8.3*  MG  --  1.7  --  2.0  --   PHOS  --  3.0  --  3.5  --   PROT 4.7* 4.8*  --   --   --   ALBUMIN 2.3* 2.3*  --   --   --   AST 18 19  --   --   --   ALT 12 14  --   --   --   ALKPHOS 51 50  --   --   --   BILITOT 0.6 0.3  --   --   --   PREALBUMIN 9.6* 8.6*  --   --   --   TRIG  --   --  79  --   --    Estimated Creatinine Clearance: 68.6 mL/min (by C-G formula based on Cr of 0.51).    Recent Labs  09/17/14 1821 09/17/14 2326 09/18/14 0643  GLUCAP 152* 119* 155*    Medications:  Scheduled:  . enoxaparin (LOVENOX) injection  80 mg Subcutaneous Q12H  . insulin aspart  0-9 Units Subcutaneous 4 times per day  .  metoCLOPramide (REGLAN) injection  5 mg Intravenous 4 times per day  . metoprolol  5 mg Intravenous 4 times per day  . pantoprazole (PROTONIX) IV  40 mg Intravenous QHS    Insulin Requirements in the past 24 hours:  4 units Sens SSI, 12 units insulin in TPN  Current Nutrition:  Clinimix E 5/15 at 67ml/hr and 20% lipids at 10 ml/hr provides 1502 kcal and 72 gm protein Goal: Clinimix E 5/15 at 83 ml/hr and 20% lipids at 10 ml/hr to provide 100 gm protein and 1894 kcal  Nutritional Goals: (per RD noted 12/24) Kcal: 1900-2100 Protein: 105-115 grams   Assessment: Admitted with N/V; s/p Whipple procedure for pancreatic cancer 12/8. Since surgery, patient states she has not been eating a full diet as she has not been able to tolerate it. She described  her intake as chicken noodle soup, toast, scrambled eggs. Pt reports weight loss with her usual body weight of ~215 lbs back in March 2015. Per Epic weight records, pt with a 2.7% weight loss in 1 week. PICC line placed  GI: Hx GERD, diverticulosis, chronic gastritis, ischemic colitis. NG output 725 mL yesterday. Abd NT/ND. CT (-)obstruction. PPI IV, Metoclopramide.   Endo: (-)hx DM. CBGs primarily low 150s & 119 x 1  Lytes: Na 133, K 4.2 (goal >4 with possible ileus)  Renal: Cr stable, I/O not charted appropriately. MIVf: D5NS with 35mEq KCl at 35 ml/hr.  Pulm: RA  Cards: Hx HTN, Afib. VSS. Amiodarone drip, Metoprolol IV  AC: (+)PE, (-)DVT. Lovenox  Hepatobil: LFTs wnl. TG 79.  Neuro: Anxiety improved on Ativan  ID: Afeb, WBC wnl, on no antibiotics  Best Practices: Enoxaparin for PE TPN Access: PICC 12/24 TPN day#: 12/24 >>  Plan: - Continue Clinimix E 5/15 at 37ml/hr and lipids at 15ml/hr - Cont insulin 12 units/bag (pt will receive ~11 units daily)  - TPN labs in AM  Gracy Bruins, PharmD [facility]

## 2014-09-18 NOTE — Progress Notes (Signed)
  Subjective: Comfortable Had 2 bm's  Objective: Vital signs in last 24 hours: Temp:  [98.3 F (36.8 C)-99.1 F (37.3 C)] 99.1 F (37.3 C) (12/27 0545) Pulse Rate:  [57-75] 57 (12/27 0545) Resp:  [16-18] 16 (12/27 0545) BP: (105-133)/(63-76) 121/76 mmHg (12/27 1100) SpO2:  [98 %] 98 % (12/27 0545) Last BM Date: 09/17/14  Intake/Output from previous day: 12/26 0701 - 12/27 0700 In: 30 [NG/GT:30] Out: 2702 [Urine:1325; Emesis/NG output:1375; Stool:2] Intake/Output this shift: Total I/O In: -  Out: 250 [Emesis/NG output:250]  Abdomen soft, slight serous drainage and old hematoma expressed from midline  Lab Results:   Recent Labs  09/16/14 0500  WBC 6.7  HGB 8.4*  HCT 25.6*  PLT 329   BMET  Recent Labs  09/17/14 0440 09/18/14 0555  NA 135 133*  K 4.2 4.2  CL 102 101  CO2 27 27  GLUCOSE 140* 137*  BUN 8 14  CREATININE 0.59 0.51  CALCIUM 8.1* 8.3*   PT/INR No results for input(s): LABPROT, INR in the last 72 hours. ABG No results for input(s): PHART, HCO3 in the last 72 hours.  Invalid input(s): PCO2, PO2  Studies/Results: Ct Abdomen Pelvis W Contrast  09/16/2014   CLINICAL DATA:  Abdominal pain  EXAM: CT ABDOMEN AND PELVIS WITH CONTRAST  TECHNIQUE: Multidetector CT imaging of the abdomen and pelvis was performed using the standard protocol following bolus administration of intravenous contrast.  CONTRAST:  150mL OMNIPAQUE IOHEXOL 300 MG/ML  SOLN  COMPARISON:  07/14/2014  FINDINGS: There is pulmonary thromboembolism in the right lower lobe pulmonary artery. Tiny right pleural effusion. Tiny left pleural effusion. Mild dependent atelectasis at the lung bases.  Diffuse hepatic steatosis.  NG tube tip in the body of the stomach.  Pancreatic head is been resected. Rule on wide loop has been placed. There is wall thickening of the Roux-en-Y loop extending into the hilum of the liver.  There is no extraluminal bowel gas.  There is no abscess cavity.  Small amount  of free fluid in the pelvis and adjacent to the liver. Trace free fluid in the left pericolic gutter.  Kidneys, adrenal glands, spleen are within normal limits.  Postop changes in the anterior abdominal wall  Stable lower thoracic spine.  No new compression deformity.  IMPRESSION: Postoperative changes as described from pancreatic head resection. There is wall thickening of the row 1 wide loop extending to the hilum of the liver. Inflammatory process is not excluded.  There is acute pulmonary thromboembolism in the right lower lobe.  Critical Value/emergent results were called by telephone at the time of interpretation on 09/16/2014 at 8:10 pm to [facility], [place] who verbally acknowledged these results.   Electronically Signed   By: Maryclare Bean M.D.   On: 09/16/2014 20:11    Anti-infectives: Anti-infectives    None      Assessment/Plan:  Gastric ileus PE  Will clamp NG and see if she can handle it  LOS: 4 days    [name] A 09/18/2014

## 2014-09-19 LAB — COMPREHENSIVE METABOLIC PANEL
ALK PHOS: 72 U/L (ref 39–117)
ALT: 28 U/L (ref 0–35)
ANION GAP: 8 (ref 5–15)
AST: 34 U/L (ref 0–37)
Albumin: 2.5 g/dL — ABNORMAL LOW (ref 3.5–5.2)
BILIRUBIN TOTAL: 0.2 mg/dL — AB (ref 0.3–1.2)
BUN: 16 mg/dL (ref 6–23)
CHLORIDE: 101 meq/L (ref 96–112)
CO2: 23 mmol/L (ref 19–32)
Calcium: 8.2 mg/dL — ABNORMAL LOW (ref 8.4–10.5)
Creatinine, Ser: 0.51 mg/dL (ref 0.50–1.10)
GFR calc Af Amer: >90 mL/min (ref >90)
Glucose, Bld: 122 mg/dL — ABNORMAL HIGH (ref 70–99)
POTASSIUM: 4.6 mmol/L (ref 3.5–5.1)
Sodium: 132 mmol/L — ABNORMAL LOW (ref 135–145)
Total Protein: 5.4 g/dL — ABNORMAL LOW (ref 6.0–8.3)

## 2014-09-19 LAB — DIFFERENTIAL
Basophils Absolute: 0 10*3/uL (ref 0.0–0.1)
Basophils Relative: 0 % (ref 0–1)
Eosinophils Absolute: 0.7 10*3/uL (ref 0.0–0.7)
Eosinophils Relative: 8 % — ABNORMAL HIGH (ref 0–5)
LYMPHS ABS: 1.5 10*3/uL (ref 0.7–4.0)
LYMPHS PCT: 19 % (ref 12–46)
MONOS PCT: 10 % (ref 3–12)
Monocytes Absolute: 0.8 10*3/uL (ref 0.1–1.0)
NEUTROS ABS: 5.2 10*3/uL (ref 1.7–7.7)
NEUTROS PCT: 63 % (ref 43–77)

## 2014-09-19 LAB — GLUCOSE, CAPILLARY
Glucose-Capillary: 145 mg/dL — ABNORMAL HIGH (ref 70–99)
Glucose-Capillary: 149 mg/dL — ABNORMAL HIGH (ref 70–99)
Glucose-Capillary: 159 mg/dL — ABNORMAL HIGH (ref 70–99)
Glucose-Capillary: 159 mg/dL — ABNORMAL HIGH (ref 70–99)

## 2014-09-19 LAB — MAGNESIUM: Magnesium: 2 mg/dL (ref 1.5–2.5)

## 2014-09-19 LAB — CBC
HEMATOCRIT: 28.9 % — AB (ref 36.0–46.0)
Hemoglobin: 9.5 g/dL — ABNORMAL LOW (ref 12.0–15.0)
MCH: 28.2 pg (ref 26.0–34.0)
MCHC: 32.9 g/dL (ref 30.0–36.0)
MCV: 85.8 fL (ref 78.0–100.0)
PLATELETS: 252 10*3/uL (ref 150–400)
RBC: 3.37 MIL/uL — ABNORMAL LOW (ref 3.87–5.11)
RDW: 13.9 % (ref 11.5–15.5)
WBC: 8.2 10*3/uL (ref 4.0–10.5)

## 2014-09-19 LAB — PHOSPHORUS: Phosphorus: 4.2 mg/dL (ref 2.3–4.6)

## 2014-09-19 MED ORDER — FAT EMULSION 20 % IV EMUL
250.0000 mL | INTRAVENOUS | Status: AC
  Administered 2014-09-19: 250 mL via INTRAVENOUS
  Filled 2014-09-19: qty 250

## 2014-09-19 MED ORDER — TRACE MINERALS CR-CU-F-FE-I-MN-MO-SE-ZN IV SOLN
INTRAVENOUS | Status: AC
  Administered 2014-09-19: 18:00:00 via INTRAVENOUS
  Filled 2014-09-19: qty 2000

## 2014-09-19 NOTE — Progress Notes (Signed)
Pt upset and crying.  She does not want to be stuck again for peripheral IV.  She asked that her Dr. Barry Dienes be called.  Order entered for picc exchange.  IV picc RN states this will be an issue as pt is on TPN and can not be stopped and restarted ? Until tonight? Currently pt is getting TPN, lipids and amiodarone gtt through double lumen picc. Dr office was called and Dr. Barry Dienes off this morning and will not make rounds until after lunch today.  Pt made aware. Medication given for anxiety. Pt resting with call bell within reach.  Will continue to monitor. Payton Emerald, RN

## 2014-09-19 NOTE — Progress Notes (Signed)
Subjective: Tearfull.  Objective: Vital signs in last 24 hours: Temp:  [98.3 F (36.8 C)-99.2 F (37.3 C)] 98.8 F (37.1 C) (12/28 0437) Pulse Rate:  [60-65] 65 (12/28 0437) Resp:  [20-22] 20 (12/28 0437) BP: (107-123)/(50-76) 107/50 mmHg (12/28 0437) SpO2:  [97 %-98 %] 98 % (12/28 0437) Last BM Date: 09/17/14  Intake/Output from previous day: 12/27 0701 - 12/28 0700 In: -  Out: 1800 [Urine:1550; Emesis/NG output:250] Intake/Output this shift:    Medications Current Facility-Administered Medications  Medication Dose Route Frequency Provider Last Rate Last Dose  . amiodarone (NEXTERONE PREMIX) 360 MG/200ML (1.8 mg/mL) IV infusion  30 mg/hr Intravenous Continuous [name], MD 16.7 mL/hr at 09/18/14 2208 30 mg/hr at 09/18/14 2208  . dextrose 5 % and 0.9 % NaCl with KCl 20 mEq/L infusion   Intravenous Continuous Jaquita Folds, RPH 35 mL/hr at 09/18/14 1113    . enoxaparin (LOVENOX) injection 80 mg  80 mg Subcutaneous Q12H [place], [place]   80 mg at 09/18/14 2148  . TPN (CLINIMIX-E) Adult   Intravenous Continuous TPN Jaquita Folds, RPH 83 mL/hr at 09/18/14 1738     And  . fat emulsion 20 % infusion 250 mL  250 mL Intravenous Continuous TPN Jaquita Folds, RPH 10 mL/hr at 09/18/14 1738 250 mL at 09/18/14 1738  . TPN (CLINIMIX-E) Adult   Intravenous Continuous TPN [name], MD       And  . fat emulsion 20 % infusion 250 mL  250 mL Intravenous Continuous TPN [name], MD      . insulin aspart (novoLOG) injection 0-9 Units  0-9 Units Subcutaneous 4 times per day Sindy [place], [facility]   1 Units at 09/19/14 [phone]  . LORazepam (ATIVAN) injection 0.5-1 mg  0.5-1 mg Intravenous QID PRN [name], MD   1 mg at 09/19/14 0855  . metoCLOPramide (REGLAN) injection 5 mg  5 mg Intravenous 4 times per day Fanny Skates, MD   5 mg at 09/19/14 3474  . metoprolol (LOPRESSOR) injection 5 mg  5 mg Intravenous 4 times per day Fanny Skates, MD   5 mg at 09/19/14  [phone]  . morphine 2 MG/ML injection 1-4 mg  1-4 mg Intravenous Q1H PRN Mcarthur Rossetti, MD   4 mg at 09/15/14 1628  . ondansetron (ZOFRAN) injection 4 mg  4 mg Intravenous Q6H PRN Mcarthur Rossetti, MD      . pantoprazole (PROTONIX) injection 40 mg  40 mg Intravenous QHS Mcarthur Rossetti, MD   40 mg at 09/18/14 2148  . sodium chloride 0.9 % injection 10-40 mL  10-40 mL Intracatheter PRN [name], MD   20 mL at 09/18/14 0602  . zolpidem (AMBIEN) tablet 5 mg  5 mg Oral QHS PRN Gayland Curry, MD   5 mg at 09/15/14 2133   Facility-Administered Medications Ordered in Other Encounters  Medication Dose Route Frequency Provider Last Rate Last Dose  . sodium chloride 0.9 % bolus 1,000 mL  1,000 mL Intravenous Once Coralie Keens, MD        PE: General appearance: alert, cooperative, no distress and laying flat. Lungs: clear to auscultation bilaterally Heart: regular rate and rhythm, S1, S2 normal, no murmur, click, rub or gallop Abdomen: +BS, soft, nontender Extremities: No LEE Pulses: 2+ and symmetric Skin: Warm and dry Neurologic: Grossly normal  Lab Results:   Recent Labs  09/19/14 0545  WBC 8.2  HGB 9.5*  HCT 28.9*  PLT 252   BMET  Recent Labs  09/17/14 0440 09/18/14 0555 09/19/14 0545  NA 135 133* 132*  K 4.2 4.2 4.6  CL 102 101 101  CO2 27 27 23   GLUCOSE 140* 137* 122*  BUN 8 14 16   CREATININE 0.59 0.51 0.51  CALCIUM 8.1* 8.3* 8.2*      Assessment/Plan   Active Problems:   PAF-NSR on Flec prior to adm   HTN (hypertension)   Obesity (BMI 30-39.9)   Cancer of head of pancreas-S/P Whipple 08/30/14   Nausea and vomiting   Protein-calorie malnutrition, severe   Pulmonary embolism   Hyperglycemia   Anemia  Continues in NSR on IV amio. Plan per Dr. Haroldine Laws to transition back to Flecainide once able to take PO. On full dose Lovenox per [facility] for pulm embolus on CT scan.  Bp stable.    LOS: 5 days    HAGER, BRYAN PA-C 09/19/2014 10:36  AM  I have seen and examined the patient along with HAGER, BRYAN PA-C.  I have reviewed the chart, notes and new data.  I agree with PA's note.  Key new complaints: tired of NG tube and hospital stay Key examination changes: no cardiac abnormalities Key new findings / data: NSR on tele  PLAN: Switch back to oral flecainide when reliably taking PO meds.  Sanda Klein, MD, [place] [phone] 09/19/2014, 11:56 AM

## 2014-09-19 NOTE — Progress Notes (Signed)
ANTICOAGULATION CONSULT NOTE - Follow Up Consult  Pharmacy Consult for Lovenox Indication: pulmonary embolus  Allergies  Allergen Reactions  . Ace Inhibitors     Cough  . Codeine Other (See Comments)    Stomach pain  . Morphine And Related Other (See Comments)    Severe headache. Pt has not had since 90s and is ok with taking morphine.  . Sulfa Antibiotics Hives    Patient Measurements: Height: 5\' 3"  (160 cm) Weight: 178 lb (80.74 kg) IBW/kg (Calculated) : 52.4  Vital Signs: Temp: 98.8 F (37.1 C) (12/28 0437) Temp Source: Oral (12/28 0437) BP: 107/50 mmHg (12/28 0437) Pulse Rate: 65 (12/28 0437)  Labs:  Recent Labs  09/17/14 0440 09/18/14 0555 09/19/14 0545  HGB  --   --  9.5*  HCT  --   --  28.9*  PLT  --   --  252  CREATININE 0.59 0.51 0.51    Estimated Creatinine Clearance: 68.6 mL/min (by C-G formula based on Cr of 0.51).   Medications:  Lovenox 80mg  SQ q12h  Assessment: 68 year old female s/p Whipple procedure for pancreatic cancer on 12/8.  She was found to have a new RLL PE and continues on anticoagulation with Lovenox.  Renal function is stable. No bleeding noted.  Hemoglobin is stable, platelet count is trending down today.  She remains NPO and on TPN for nutritional support.   Goal of Therapy:  Monitor platelets by anticoagulation protocol: Yes   Plan:  Continue Lovenox 80mg  SQ q12h Monitor CBC q72h, follow platelet count for trend Monitor for bleeding Anticipate transition to an oral anticoagulant when able to tolerate oral nutrition and medications.  Legrand Como, Pharm.D., BCPS, AAHIVP Clinical Pharmacist Phone: [phone] or [phone] 09/19/2014, 11:29 AM

## 2014-09-19 NOTE — Progress Notes (Signed)
Patient ID: [name], female   DOB: [birth year], 68 y.o.   MRN: [id number]    Subjective: NGT clamped over 24 hours.  No n/v.  Minimal belching.  Continued BMs.  Some loose stools.    Objective: Vital signs in last 24 hours: Temp:  [98.3 F (36.8 C)-99.2 F (37.3 C)] 98.8 F (37.1 C) (12/28 0437) Pulse Rate:  [60-65] 65 (12/28 0437) Resp:  [20-22] 20 (12/28 0437) BP: (107-123)/(50-76) 107/50 mmHg (12/28 0437) SpO2:  [97 %-98 %] 98 % (12/28 0437) Last BM Date: 09/17/14  Intake/Output from previous day: 12/27 0701 - 12/28 0700 In: -  Out: 1800 [Urine:1550; Emesis/NG output:250] Intake/Output this shift:    Abdomen soft, slight serous drainage and old hematoma expressed from midline  Lab Results:   Recent Labs  09/19/14 0545  WBC 8.2  HGB 9.5*  HCT 28.9*  PLT 252   BMET  Recent Labs  09/18/14 0555 09/19/14 0545  NA 133* 132*  K 4.2 4.6  CL 101 101  CO2 27 23  GLUCOSE 137* 122*  BUN 14 16  CREATININE 0.51 0.51  CALCIUM 8.3* 8.2*   PT/INR No results for input(s): LABPROT, INR in the last 72 hours. ABG No results for input(s): PHART, HCO3 in the last 72 hours.  Invalid input(s): PCO2, PO2  Studies/Results: No results found.  Anti-infectives: Anti-infectives    None      Assessment/Plan:  Gastric ileus PE  D/c NGT today. Clear liquids today. Continue anticoagulation and amio per cardiology. TNA for protein calorie malnutrition.      LOS: 5 days    [facility] 09/19/2014

## 2014-09-19 NOTE — Progress Notes (Signed)
PARENTERAL NUTRITION CONSULT NOTE - FOLLOW UP  Pharmacy Consult for TPN Indication: Gastric emptying issues/ileus with poor PO intake s/p Whipple Procedure  Allergies  Allergen Reactions  . Ace Inhibitors     Cough  . Codeine Other (See Comments)    Stomach pain  . Morphine And Related Other (See Comments)    Severe headache. Pt has not had since 90s and is ok with taking morphine.  . Sulfa Antibiotics Hives    Patient Measurements: Height: 5\' 3"  (160 cm) Weight: 178 lb (80.74 kg) IBW/kg (Calculated) : 52.4 Adjusted Body Weight:  Usual Weight:   Vital Signs: Temp: 98.8 F (37.1 C) (12/28 0437) Temp Source: Oral (12/28 0437) BP: 107/50 mmHg (12/28 0437) Pulse Rate: 65 (12/28 0437) Intake/Output from previous day: 12/27 0701 - 12/28 0700 In: -  Out: 1800 [Urine:1550; Emesis/NG output:250] Intake/Output from this shift:    Labs:  Recent Labs  09/19/14 0545  WBC 8.2  HGB 9.5*  HCT 28.9*  PLT 252     Recent Labs  09/17/14 0440 09/18/14 0555 09/19/14 0545  NA 135 133* 132*  K 4.2 4.2 4.6  CL 102 101 101  CO2 27 27 23   GLUCOSE 140* 137* 122*  BUN 8 14 16   CREATININE 0.59 0.51 0.51  CALCIUM 8.1* 8.3* 8.2*  MG 2.0  --  2.0  PHOS 3.5  --  4.2  PROT  --   --  5.4*  ALBUMIN  --   --  2.5*  AST  --   --  34  ALT  --   --  28  ALKPHOS  --   --  72  BILITOT  --   --  0.2*   Estimated Creatinine Clearance: 68.6 mL/min (by C-G formula based on Cr of 0.51).    Recent Labs  09/18/14 1742 09/18/14 2348 09/19/14 0606  GLUCAP 119* 160* 145*    Medications:  Scheduled:  . enoxaparin (LOVENOX) injection  80 mg Subcutaneous Q12H  . insulin aspart  0-9 Units Subcutaneous 4 times per day  . metoCLOPramide (REGLAN) injection  5 mg Intravenous 4 times per day  . metoprolol  5 mg Intravenous 4 times per day  . pantoprazole (PROTONIX) IV  40 mg Intravenous QHS    Insulin Requirements in the past 24 hours:  4 units Sens SSI, 12 units insulin in  TPN  Current Nutrition:  Clinimix E 5/15 at 32ml/hr and 20% lipids at 10 ml/hr provides 1894 kcal and 100 gm protein  Nutritional Goals: (per RD noted 12/24) Kcal: 1900-2100 Protein: 105-115 grams   Assessment: Admitted with N/V; s/p Whipple procedure for pancreatic cancer 12/8. Since surgery, patient states she has not been eating a full diet as she has not been able to tolerate it. She described her intake as chicken noodle soup, toast, scrambled eggs. Pt reports weight loss with her usual body weight of ~215 lbs back in March 2015. Per Epic weight records, pt with a 2.7% weight loss in 1 week. PICC line placed  GI: Hx GERD, diverticulosis, chronic gastritis, ischemic colitis. NG clamped Abd NT/ND. CT (-)obstruction. PPI IV, Metoclopramide.   Endo: (-)hx DM. CBGs 119-160  Lytes: Na 132, K 4.6 (goal >4 with possible ileus) Phos 4.2, Mag 2   Renal: Cr stable, I/O not charted appropriately. MIVf: D5NS with 80mEq KCl at 35 ml/hr.  Pulm: RA  Cards: Hx HTN, Afib. VSS. Amiodarone drip, Metoprolol IV  AC: (+)PE, (-)DVT. Lovenox  Hepatobil: LFTs wnl. TG 79.  Neuro: Anxiety improved on Ativan  ID: Afeb, WBC wnl, on no antibiotics  Best Practices: Enoxaparin for PE TPN Access: PICC 12/24 TPN day#: 12/24 >>  Plan: - Continue Clinimix E 5/15 at 30ml/hr and lipids at 76ml/hr - Cont insulin 12 units/bag (pt will receive ~11 units daily)  - BMET in am -f/u resume diet  Thanks for allowing pharmacy to be a part of this patient's care.  Excell Seltzer, PharmD Clinical Pharmacist, [phone]

## 2014-09-20 LAB — GLUCOSE, CAPILLARY
GLUCOSE-CAPILLARY: 138 mg/dL — AB (ref 70–99)
GLUCOSE-CAPILLARY: 165 mg/dL — AB (ref 70–99)
GLUCOSE-CAPILLARY: 151 mg/dL — AB (ref 70–99)
Glucose-Capillary: 139 mg/dL — ABNORMAL HIGH (ref 70–99)

## 2014-09-20 LAB — BASIC METABOLIC PANEL
Anion gap: 6 (ref 5–15)
BUN: 16 mg/dL (ref 6–23)
CHLORIDE: 98 meq/L (ref 96–112)
CO2: 26 mmol/L (ref 19–32)
CREATININE: 0.56 mg/dL (ref 0.50–1.10)
Calcium: 8.1 mg/dL — ABNORMAL LOW (ref 8.4–10.5)
GFR calc Af Amer: >90 mL/min (ref >90)
GFR calc non Af Amer: >90 mL/min (ref >90)
Glucose, Bld: 149 mg/dL — ABNORMAL HIGH (ref 70–99)
Potassium: 4 mmol/L (ref 3.5–5.1)
Sodium: 130 mmol/L — ABNORMAL LOW (ref 135–145)

## 2014-09-20 MED ORDER — FAT EMULSION 20 % IV EMUL
250.0000 mL | INTRAVENOUS | Status: AC
  Administered 2014-09-20: 250 mL via INTRAVENOUS
  Filled 2014-09-20: qty 250

## 2014-09-20 MED ORDER — M.V.I. ADULT IV INJ
INTRAVENOUS | Status: AC
  Administered 2014-09-20: 18:00:00 via INTRAVENOUS
  Filled 2014-09-20: qty 2000

## 2014-09-20 NOTE — Progress Notes (Signed)
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 Denies SOB. Wondering when she can be off IV nutrition and telemetry.   CURRENT MEDS . enoxaparin (LOVENOX) injection  80 mg Subcutaneous Q12H  . insulin aspart  0-9 Units Subcutaneous 4 times per day  . metoprolol  5 mg Intravenous 4 times per day  . pantoprazole (PROTONIX) IV  40 mg Intravenous QHS    OBJECTIVE  Filed Vitals:   09/19/14 0437 09/19/14 1422 09/19/14 2023 09/20/14 0700  BP: 107/50 96/63 126/72 122/74  Pulse: 65 64 73 73  Temp: 98.8 F (37.1 C) 98.3 F (36.8 C) 98 F (36.7 C) 98.2 F (36.8 C)  TempSrc: Oral Oral Oral Oral  Resp: 20 18 18    Height:      Weight:    174 lb 3.2 oz (79.017 kg)  SpO2: 98% 96% 100% 96%    Intake/Output Summary (Last 24 hours) at 09/20/14 0947 Last data filed at 09/19/14 1700  Gross per 24 hour  Intake    600 ml  Output      0 ml  Net    600 ml   Filed Weights   09/14/14 1600 09/20/14 0700  Weight: 178 lb (80.74 kg) 174 lb 3.2 oz (79.017 kg)    PHYSICAL EXAM  General: Pleasant, NAD. Neuro: Alert and oriented X 3. Moves all extremities spontaneously. Psych: Normal affect. HEENT:  Normal  Neck: Supple without bruits or JVD. Lungs:  Resp regular and unlabored, CTA. Heart: RRR no s3, s4, or murmurs. Abdomen: Soft, non-tender, non-distended. Hypoactive Extremities: No clubbing, cyanosis or edema. DP/PT/Radials 2+ and equal bilaterally.  Accessory Clinical Findings  CBC  Recent Labs  09/19/14 0545  WBC 8.2  NEUTROABS 5.2  HGB 9.5*  HCT 28.9*  MCV 85.8  PLT 035   Basic Metabolic Panel  Recent Labs  09/19/14 0545  09/20/14 0445  NA 132* 130*  K 4.6 4.0  CL 101 98  CO2 23 26  GLUCOSE 122* 149*  BUN 16 16  CREATININE 0.51 0.56  CALCIUM 8.2* 8.1*  MG 2.0  --   PHOS 4.2  --    Liver Function Tests  Recent Labs  09/19/14 0545  AST 34  ALT 28  ALKPHOS 72  BILITOT 0.2*  PROT 5.4*  ALBUMIN 2.5*    TELE NSR with HR 60-70s, no recurrent a-fib    ECG  No new EKG  Echocardiogram 08/02/2014  LV EF: 60% -  65%  ------------------------------------------------------------------- Indications:   (I48.0).  ------------------------------------------------------------------- History:  PMH: PAF. Acquired from the patient and from the patient&'s chart. Risk factors: Hypertension.  ------------------------------------------------------------------- Study Conclusions  - Left ventricle: The cavity size was normal. Systolic function was normal. The estimated ejection fraction was in the range of 60% to 65%. Wall motion was normal; there were no regional wall motion abnormalities. Doppler parameters are consistent with abnormal left ventricular relaxation (grade 1 diastolic dysfunction). There was no evidence of elevated ventricular filling pressure by Doppler parameters. - Aortic valve: Trileaflet; mildly thickened, mildly calcified leaflets. There was mild regurgitation. - Aortic root: The aortic root was  normal in size. - Mitral valve: Structurally normal valve. There was no regurgitation. - Right ventricle: Systolic function was normal. - Right atrium: The atrium was normal in size. - Tricuspid valve: There was mild regurgitation. - Pulmonary arteries: Systolic pressure was within the normal range. - Inferior vena cava: The vessel was normal in size. - Pericardium, extracardiac: There was no pericardial effusion.  Impressions:  - Normal biventricular size and systolic function, Abnormal relaxation with normal filling pressures. Mild aortic  and tricuspid regurgitation. Normal RVSP.     Radiology/Studies  Ct Abdomen Pelvis W Contrast  09/16/2014   CLINICAL DATA:  Abdominal pain  EXAM: CT ABDOMEN AND PELVIS WITH CONTRAST  TECHNIQUE: Multidetector CT imaging of the abdomen and pelvis was performed using the standard protocol following bolus administration of intravenous contrast.  CONTRAST:  118mL OMNIPAQUE IOHEXOL 300 MG/ML  SOLN  COMPARISON:  07/14/2014  FINDINGS: There is pulmonary thromboembolism in the right lower lobe pulmonary artery. Tiny right pleural effusion. Tiny left pleural effusion. Mild dependent atelectasis at the lung bases.  Diffuse hepatic steatosis.  NG tube tip in the body of the stomach.  Pancreatic head is been resected. Rule on wide loop has been placed. There is wall thickening of the Roux-en-Y loop extending into the hilum of the liver.  There is no extraluminal bowel gas.  There is no abscess cavity.  Small amount of free fluid in the pelvis and adjacent to the liver. Trace free fluid in the left pericolic gutter.  Kidneys, adrenal glands, spleen are within normal limits.  Postop changes in the anterior abdominal wall  Stable lower thoracic spine.  No new compression deformity.  IMPRESSION: Postoperative changes as described from pancreatic head resection. There is wall thickening of the row 1 wide loop extending to the hilum of the liver. Inflammatory process is not excluded.  There is acute pulmonary thromboembolism in the right lower lobe.  Critical Value/emergent results were called by telephone at the time of interpretation on 09/16/2014 at 8:10 pm to [employer], [place] who verbally acknowledged these results.   Electronically Signed   By: Maryclare Bean M.D.   On: 09/16/2014 20:11   Dg Abd Portable 1v  09/02/2014   CLINICAL DATA:  Progressive abdominal pain. Whipple procedure 3 days ago.  EXAM: PORTABLE ABDOMEN - 1 VIEW  COMPARISON:  CT of the abdomen pelvis 07/14/2014.  FINDINGS: Surgical drains are in place. The  transverse colon somewhat low lying. There is relatively little gas over the upper abdomen. This may represent fluid or inflammation in the upper abdomen. Laparotomy is noted.  IMPRESSION: Low positioning of the transverse colon may represent mass effect following surgery. Fluid or inflammation is not excluded. CT of the abdomen pelvis may be useful for further evaluation if clinically indicated.   Electronically Signed   By: Lawrence Santiago M.D.   On: 09/02/2014 13:55   Dg Abd Portable 2v  09/15/2014   CLINICAL DATA:  Vomiting, nasogastric tube placement  EXAM: PORTABLE ABDOMEN - 2 VIEW  COMPARISON:  09/02/2014  FINDINGS: Nasogastric tube terminates over the gastric body expected location. Suture line is present over the left mid abdomen. No dilated loop of large or small gas-filled bowel. Clips are noted over the mid abdomen. No acute osseous finding. No free air identified.  IMPRESSION: Nasogastric tube tip terminates over the expected location of the body of the stomach.   Electronically Signed   By: Conchita Paris M.D.   On: 09/15/2014 01:37    ASSESSMENT  AND PLAN  68 yo female who recently under whipple's procedure for pancreatic CA came in with gastroparesis, CT of abdomen on 12/25 reveal incidental finding of RLL PE as well. Currently placed on amio gtt, expect discharge on home flecainide. On full dose lovenox,   1. Pancreatic CA s/p Whipple and pancreatic stent 08/30/14, readmitted with gastroparesis  - unable to tolerate PO, continue IV amiodarone and lovanox  2. Acute PE  - had PE while on xarelto 3. H/o PAF, currently maintaining NSR 4. Severe protein calorie malnutrition, on TPN 5. Obesity Body mass index is 31.54 kg/(m^2). 6. HTN, controlled at present time 7. Anemia 8. Hyperglycemia  Signed, Almyra Deforest PA-C Pager: [phone]  I have seen and examined the patient along with Almyra Deforest PA-C.  I have reviewed the chart, notes and new data.  I agree with PA's note. NSR, no cardiac  acute problems. Resume flecainide when reliably tolerating PO diet.  Sanda Klein, MD, [place] [phone] 09/20/2014, 11:59 AM

## 2014-09-20 NOTE — Progress Notes (Signed)
PARENTERAL NUTRITION CONSULT NOTE - FOLLOW UP  Pharmacy Consult for TPN Indication: Gastric emptying issues/ileus with poor PO intake s/p Whipple Procedure  Allergies  Allergen Reactions  . Ace Inhibitors     Cough  . Codeine Other (See Comments)    Stomach pain  . Morphine And Related Other (See Comments)    Severe headache. Pt has not had since 90s and is ok with taking morphine.  . Sulfa Antibiotics Hives    Patient Measurements: Height: 5\' 3"  (160 cm) Weight: 174 lb 3.2 oz (79.017 kg) IBW/kg (Calculated) : 52.4 Adjusted Body Weight:  Usual Weight:   Vital Signs: Temp: 98.2 F (36.8 C) (12/29 0700) Temp Source: Oral (12/29 0700) BP: 122/74 mmHg (12/29 0700) Pulse Rate: 73 (12/29 0700) Intake/Output from previous day: 12/28 0701 - 12/29 0700 In: 600 [P.O.:600] Out: -  Intake/Output from this shift:    Labs:  Recent Labs  09/19/14 0545  WBC 8.2  HGB 9.5*  HCT 28.9*  PLT 252     Recent Labs  09/18/14 0555 09/19/14 0545 09/20/14 0445  NA 133* 132* 130*  K 4.2 4.6 4.0  CL 101 101 98  CO2 27 23 26   GLUCOSE 137* 122* 149*  BUN 14 16 16   CREATININE 0.51 0.51 0.56  CALCIUM 8.3* 8.2* 8.1*  MG  --  2.0  --   PHOS  --  4.2  --   PROT  --  5.4*  --   ALBUMIN  --  2.5*  --   AST  --  34  --   ALT  --  28  --   ALKPHOS  --  72  --   BILITOT  --  0.2*  --    Estimated Creatinine Clearance: 67.9 mL/min (by C-G formula based on Cr of 0.56).    Recent Labs  09/19/14 1657 09/19/14 2340 09/20/14 0714  GLUCAP 159* 149* 138*    Medications:  Scheduled:  . enoxaparin (LOVENOX) injection  80 mg Subcutaneous Q12H  . insulin aspart  0-9 Units Subcutaneous 4 times per day  . metoprolol  5 mg Intravenous 4 times per day  . pantoprazole (PROTONIX) IV  40 mg Intravenous QHS    Insulin Requirements in the past 24 hours:  4 units Sens SSI, 12 units insulin in TPN  Current Nutrition:  Clinimix E 5/15 at 47ml/hr and 20% lipids at 10 ml/hr provides 1894  kcal and 100 gm protein  Nutritional Goals: (per RD noted 12/24) Kcal: 1900-2100 Protein: 105-115 grams   Assessment: Admitted with N/V; s/p Whipple procedure for pancreatic cancer 12/8. Since surgery, patient states she has not been eating a full diet as she has not been able to tolerate it. She described her intake as chicken noodle soup, toast, scrambled eggs. Pt reports weight loss with her usual body weight of ~215 lbs back in March 2015. Per Epic weight records, pt with a 2.7% weight loss in 1 week. PICC line placed  GI: Hx GERD, diverticulosis, chronic gastritis, ischemic colitis. NG clamped, no n/v  CT (-)obstruction. PPI IV, Metoclopramide. Some loose stools noted.  Clear liquid diet ordered  Endo: (-)hx DM. CBGs 138-159  Lytes: Na 130, K 4.0 (goal >4 with possible ileus)  Renal: Cr stable, I/O not charted appropriately.  Pulm: RA  Cards: Hx HTN, Afib. VSS. IV lopressor, amiodarone  AC: (+)PE, (-)DVT. Lovenox  Hepatobil: LFTs wnl. TG 79.  Neuro: Anxiety improved on Ativan  ID: Afeb, WBC wnl, on no antibiotics  Best Practices: Enoxaparin for PE TPN Access: PICC 12/24 TPN day#: 12/24 >>  Plan: - Continue Clinimix E 5/15 at 62ml/hr and lipids at 83ml/hr - Cont insulin 12 units/bag (pt will receive ~11 units daily)  -f/u advance diet  Thanks for allowing pharmacy to be a part of this patient's care.  Excell Seltzer, PharmD Clinical Pharmacist, [phone]

## 2014-09-20 NOTE — Care Management Note (Addendum)
    Page 1 of 1   09/26/2014     11:52:58 AM CARE MANAGEMENT NOTE 09/26/2014  Patient:  [name], [name]   Account Number:  [id number]  Date Initiated:  09/20/2014  Documentation initiated by:  Elissa Hefty  Subjective/Objective Assessment:   adm w nausea and vomiting     Action/Plan:   lives w husband, pcp dr Lynelle Smoke spear   Anticipated DC Date:  09/26/2014   Anticipated DC Plan:  HOME/SELF CARE         Choice offered to / List presented to:             Status of service:  Completed, signed off Medicare Important Message given?  YES (If response is "NO", the following Medicare IM given date fields will be blank) Date Medicare IM given:  09/20/2014 Medicare IM given by:  Elissa Hefty Date Additional Medicare IM given:  09/26/2014 Additional Medicare IM given by:  Marvetta Gibbons  Discharge Disposition:  HOME/SELF CARE  Per UR Regulation:  Reviewed for med. necessity/level of care/duration of stay  If discussed at [place] of Stay Meetings, dates discussed:    Comments:

## 2014-09-20 NOTE — Progress Notes (Signed)
Patient ID: [name], female   DOB: August 09, 1946, 68 y.o.   MRN: [id number]    Subjective: Tolerated clear liquids.  No N/V  Objective: Vital signs in last 24 hours: Temp:  [98 F (36.7 C)-98.3 F (36.8 C)] 98.2 F (36.8 C) (12/29 0700) Pulse Rate:  [64-73] 73 (12/29 0700) Resp:  [18] 18 (12/28 2023) BP: (96-126)/(63-74) 122/74 mmHg (12/29 0700) SpO2:  [96 %-100 %] 96 % (12/29 0700) Weight:  [174 lb 3.2 oz (79.017 kg)] 174 lb 3.2 oz (79.017 kg) (12/29 0700) Last BM Date: 09/17/14  Intake/Output from previous day: 12/28 0701 - 12/29 0700 In: 600 [P.O.:600] Out: -  Intake/Output this shift:    Abdomen soft, slight serous drainage and old hematoma expressed from midline  Lab Results:   Recent Labs  09/19/14 0545  WBC 8.2  HGB 9.5*  HCT 28.9*  PLT 252   BMET  Recent Labs  09/19/14 0545 09/20/14 0445  NA 132* 130*  K 4.6 4.0  CL 101 98  CO2 23 26  GLUCOSE 122* 149*  BUN 16 16  CREATININE 0.51 0.56  CALCIUM 8.2* 8.1*   PT/INR No results for input(s): LABPROT, INR in the last 72 hours. ABG No results for input(s): PHART, HCO3 in the last 72 hours.  Invalid input(s): PCO2, PO2  Studies/Results: No results found.  Anti-infectives: Anti-infectives    None      Assessment/Plan:  Gastric ileus PE  Advance to full liquids Continue anticoagulation and amio per cardiology. TNA for protein calorie malnutrition.      LOS: 6 days    [facility] 09/20/2014

## 2014-09-21 LAB — GLUCOSE, CAPILLARY
GLUCOSE-CAPILLARY: 128 mg/dL — AB (ref 70–99)
Glucose-Capillary: 162 mg/dL — ABNORMAL HIGH (ref 70–99)
Glucose-Capillary: 150 mg/dL — ABNORMAL HIGH (ref 70–99)
Glucose-Capillary: 165 mg/dL — ABNORMAL HIGH (ref 70–99)

## 2014-09-21 LAB — CREATININE, SERUM
Creatinine, Ser: 0.48 mg/dL — ABNORMAL LOW (ref 0.50–1.10)
GFR calc Af Amer: >90 mL/min (ref >90)
GFR calc non Af Amer: >90 mL/min (ref >90)

## 2014-09-21 MED ORDER — TRACE MINERALS CR-CU-F-FE-I-MN-MO-SE-ZN IV SOLN
INTRAVENOUS | Status: AC
  Administered 2014-09-21: 18:00:00 via INTRAVENOUS
  Filled 2014-09-21: qty 2000

## 2014-09-21 MED ORDER — BOOST / RESOURCE BREEZE PO LIQD
1.0000 | Freq: Three times a day (TID) | ORAL | Status: DC
  Administered 2014-09-21 – 2014-09-22 (×2): 1 via ORAL

## 2014-09-21 MED ORDER — FAT EMULSION 20 % IV EMUL
250.0000 mL | INTRAVENOUS | Status: AC
  Administered 2014-09-21: 250 mL via INTRAVENOUS
  Filled 2014-09-21: qty 250

## 2014-09-21 NOTE — Progress Notes (Signed)
Patient ID: [name], female   DOB: November 11, 1945, 68 y.o.   MRN: [id number]    Subjective: Had one episode of emesis today with ice cream.    Objective: Vital signs in last 24 hours: Temp:  [98 F (36.7 C)-98.8 F (37.1 C)] 98 F (36.7 C) (12/30 0511) Pulse Rate:  [67-68] 68 (12/30 0511) Resp:  [18] 18 (12/30 0511) BP: (102-110)/(56-69) 106/56 mmHg (12/30 0511) SpO2:  [98 %-100 %] 98 % (12/30 0511) Last BM Date: 09/19/14  Intake/Output from previous day: 12/29 0701 - 12/30 0700 In: 9975.4 [P.O.:240; I.V.:2179.6; TPN:7555.8] Out: -  Intake/Output this shift:    Abdomen soft, nt, nd  Lab Results:   Recent Labs  09/19/14 0545  WBC 8.2  HGB 9.5*  HCT 28.9*  PLT 252   BMET  Recent Labs  09/19/14 0545 09/20/14 0445 09/21/14 0509  NA 132* 130*  --   K 4.6 4.0  --   CL 101 98  --   CO2 23 26  --   GLUCOSE 122* 149*  --   BUN 16 16  --   CREATININE 0.51 0.56 0.48*  CALCIUM 8.2* 8.1*  --    PT/INR No results for input(s): LABPROT, INR in the last 72 hours. ABG No results for input(s): PHART, HCO3 in the last 72 hours.  Invalid input(s): PCO2, PO2  Studies/Results: No results found.  Anti-infectives: Anti-infectives    None      Assessment/Plan:  Gastric ileus PE  Stay on full liquids today.  Ambulate Continue anticoagulation and amio per cardiology. TNA for protein calorie malnutrition.      LOS: 7 days    [name] 09/21/2014

## 2014-09-21 NOTE — Progress Notes (Signed)
NUTRITION FOLLOW UP  Intervention:  1.  Parenteral nutrition; Management per PharmD. 2.  Supplements; patient with poor PO tolerance of full liquids.  Resource Breeze po TID, each supplement provides 250 kcal and 9 grams of protein.  Nutrition Dx:   Inadequate oral intake, ongoing  Monitor:   1.  Food/Beverage; pt meeting >/=90% estimated needs with tolerance.  Not progressing, patient with poor diet tolerance at this time. 2.  Wt/wt change; monitor trends.  Ongoing.  3.  Parenteral nutrition; initiation with tolerance.  Management per PharmD.  Ongoing.   Assessment:   Pt is s/p a Whipple procedure performed 12/8. She was just discharged over the weekend. She developed nausea and vomiting last night that has persisted. She has mild cramping abdominal pain.  NGT has been removed, and patient tolerated clear liquids yesterday.  Advanced to fulls, however with emesis this AM.  Patient states that the clear liquids (with exception of beef broth) do not cause pain or cramping, however full liquids causing nausea with small amounts.  Patient states that she has a few sips of coffee and few bites of ice cream prior to emesis. She was previously tolerating solid foods and Ensure at home.    Patient disappointed with emesis this AM, was hopeful for diet advancement.  RD discussed nutrition-related goals with patient.   Continues TPN: Patient is receiving TPN with Clinimix E 5/15 @ 50 ml/hr and lipids @ 10 ml/hr. Provides 1200 ml, 1842 kcal, and 60 grams protein per day. Meets 100% minimum estimated energy needs and 50% minimum estimated protein needs. Need to support protein intake.   Patient with several BMs over the 24 hrs.  Per RN, most recent BM was this morning.    Height: Ht Readings from Last 1 Encounters:  09/14/14 5\' 3"  (1.6 m)    Weight Status:   Wt Readings from Last 1 Encounters:  09/20/14 174 lb 3.2 oz (79.017 kg)    Re-estimated needs:  Kcal: 1900-2100 Protein:  105-115 Fluid: >2.0 L/day  Skin: incision to abdomen  Diet Order: TPN (CLINIMIX-E) Adult Diet full liquid TPN (CLINIMIX-E) Adult   Intake/Output Summary (Last 24 hours) at 09/21/14 1038 Last data filed at 09/21/14 0900  Gross per 24 hour  Intake 10215.39 ml  Output      0 ml  Net 10215.39 ml    Last BM: 12/30  Labs:   Recent Labs Lab 09/16/14 0500 09/17/14 0440 09/18/14 0555 09/19/14 0545 09/20/14 0445 09/21/14 0509  NA 134* 135 133* 132* 130*  --   K 3.6 4.2 4.2 4.6 4.0  --   CL 103 102 101 101 98  --   CO2 28 27 27 23 26   --   BUN 9 8 14 16 16   --   CREATININE 0.59 0.59 0.51 0.51 0.56 0.48*  CALCIUM 7.9* 8.1* 8.3* 8.2* 8.1*  --   MG 1.7 2.0  --  2.0  --   --   PHOS 3.0 3.5  --  4.2  --   --   GLUCOSE 184* 140* 137* 122* 149*  --     CBG (last 3)   Recent Labs  09/20/14 2154 09/21/14 0334 09/21/14 0610  GLUCAP 139* 162* 150*    Scheduled Meds: . enoxaparin (LOVENOX) injection  80 mg Subcutaneous Q12H  . insulin aspart  0-9 Units Subcutaneous 4 times per day  . metoprolol  5 mg Intravenous 4 times per day  . pantoprazole (PROTONIX) IV  40 mg Intravenous  QHS    Continuous Infusions: . amiodarone 30 mg/hr (09/21/14 0224)  . Marland KitchenTPN (CLINIMIX-E) Adult 83 mL/hr at 09/20/14 1738   And  . fat emulsion 250 mL (09/20/14 1739)  . Marland KitchenTPN (CLINIMIX-E) Adult     And  . fat emulsion      Brynda Greathouse, MS RD LDN Clinical Inpatient Dietitian Weekend/After hours pager: [phone]

## 2014-09-21 NOTE — Progress Notes (Signed)
Pt had nausea and vomitting, pt also had several bm today. Monitoring will continue.

## 2014-09-21 NOTE — Progress Notes (Signed)
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 On full liquid diet. Tearful this morning. Wondering when she can be off IV medication and telemetry.   CURRENT MEDS . enoxaparin (LOVENOX) injection  80 mg Subcutaneous Q12H  . feeding supplement (RESOURCE BREEZE)  1 Container Oral TID BM  . insulin aspart  0-9 Units Subcutaneous 4 times per day  . metoprolol  5 mg Intravenous 4 times per day  . pantoprazole (PROTONIX) IV  40 mg Intravenous QHS    OBJECTIVE  Filed Vitals:   09/20/14 0700 09/20/14 1414 09/20/14 1948 09/21/14 0511  BP: 122/74 110/69 102/60 106/56  Pulse: 73 67 67 68  Temp: 98.2 F (36.8 C) 98.5 F (36.9 C) 98.8 F (37.1 C) 98 F (36.7 C)  TempSrc: Oral Oral Oral Oral  Resp:  18 18 18   Height:      Weight: 174 lb 3.2 oz (79.017 kg)     SpO2: 96% 100% 98% 98%    Intake/Output Summary (Last 24 hours) at 09/21/14 1208 Last data filed at 09/21/14 0900  Gross per 24 hour  Intake 10215.39 ml  Output      0 ml  Net 10215.39 ml   Filed Weights   09/14/14 1600 09/20/14 0700  Weight: 178 lb (80.74 kg) 174 lb 3.2 oz (79.017 kg)    PHYSICAL EXAM  General: Pleasant, NAD. Neuro: Alert and oriented X 3. Moves all extremities spontaneously. Psych: Normal affect. HEENT:  Normal  Neck: Supple without bruits or JVD. Lungs:  Resp regular and unlabored, CTA. Heart: RRR no s3, s4, or murmurs. Abdomen: Soft, non-tender, non-distended. Hypoactive Extremities: No clubbing, cyanosis or edema. DP/PT/Radials 2+ and equal bilaterally.  Accessory Clinical Findings  CBC  Recent Labs  09/19/14 0545  WBC 8.2  NEUTROABS 5.2  HGB 9.5*  HCT 28.9*  MCV  85.8  PLT 875   Basic Metabolic Panel  Recent Labs  09/19/14 0545 09/20/14 0445 09/21/14 0509  NA 132* 130*  --   K 4.6 4.0  --   CL 101 98  --   CO2 23 26  --   GLUCOSE 122* 149*  --   BUN 16 16  --   CREATININE 0.51 0.56 0.48*  CALCIUM 8.2* 8.1*  --   MG 2.0  --   --   PHOS 4.2  --   --    Liver Function Tests  Recent Labs  09/19/14 0545  AST 34  ALT 28  ALKPHOS 72  BILITOT 0.2*  PROT 5.4*  ALBUMIN 2.5*    TELE NSR with HR 50-70s, no recurrent a-fib    ECG  No new EKG  Echocardiogram 08/02/2014  LV EF: 60% -  65%  ------------------------------------------------------------------- Indications:   (I48.0).  ------------------------------------------------------------------- History:  PMH: PAF. Acquired from the patient and from the patient&'s chart. Risk factors: Hypertension.  ------------------------------------------------------------------- Study Conclusions  - Left ventricle: The cavity size was normal. Systolic function was normal. The estimated ejection fraction was in the range of 60% to 65%. Wall motion was normal; there were no regional wall motion abnormalities. Doppler parameters are consistent with abnormal left ventricular relaxation (  grade 1 diastolic dysfunction). There was no evidence of elevated ventricular filling pressure by Doppler parameters. - Aortic valve: Trileaflet; mildly thickened, mildly calcified leaflets. There was mild regurgitation. - Aortic root: The aortic root was normal in size. - Mitral valve: Structurally normal valve. There was no regurgitation. - Right ventricle: Systolic function was normal. - Right atrium: The atrium was normal in size. - Tricuspid valve: There was mild regurgitation. - Pulmonary arteries: Systolic pressure was within the normal range. - Inferior vena cava: The vessel was normal in size. - Pericardium, extracardiac: There was no pericardial  effusion.  Impressions:  - Normal biventricular size and systolic function, Abnormal relaxation with normal filling pressures. Mild aortic and tricuspid regurgitation. Normal RVSP.     Radiology/Studies  Ct Abdomen Pelvis W Contrast  09/16/2014   CLINICAL DATA:  Abdominal pain  EXAM: CT ABDOMEN AND PELVIS WITH CONTRAST  TECHNIQUE: Multidetector CT imaging of the abdomen and pelvis was performed using the standard protocol following bolus administration of intravenous contrast.  CONTRAST:  152mL OMNIPAQUE IOHEXOL 300 MG/ML  SOLN  COMPARISON:  07/14/2014  FINDINGS: There is pulmonary thromboembolism in the right lower lobe pulmonary artery. Tiny right pleural effusion. Tiny left pleural effusion. Mild dependent atelectasis at the lung bases.  Diffuse hepatic steatosis.  NG tube tip in the body of the stomach.  Pancreatic head is been resected. Rule on wide loop has been placed. There is wall thickening of the Roux-en-Y loop extending into the hilum of the liver.  There is no extraluminal bowel gas.  There is no abscess cavity.  Small amount of free fluid in the pelvis and adjacent to the liver. Trace free fluid in the left pericolic gutter.  Kidneys, adrenal glands, spleen are within normal limits.  Postop changes in the anterior abdominal wall  Stable lower thoracic spine.  No new compression deformity.  IMPRESSION: Postoperative changes as described from pancreatic head resection. There is wall thickening of the row 1 wide loop extending to the hilum of the liver. Inflammatory process is not excluded.  There is acute pulmonary thromboembolism in the right lower lobe.  Critical Value/emergent results were called by telephone at the time of interpretation on 09/16/2014 at 8:10 pm to [facility], [place] who verbally acknowledged these results.   Electronically Signed   By: Maryclare Bean M.D.   On: 09/16/2014 20:11   Dg Abd Portable 1v  09/02/2014   CLINICAL DATA:  Progressive abdominal pain. Whipple procedure  3 days ago.  EXAM: PORTABLE ABDOMEN - 1 VIEW  COMPARISON:  CT of the abdomen pelvis 07/14/2014.  FINDINGS: Surgical drains are in place. The transverse colon somewhat low lying. There is relatively little gas over the upper abdomen. This may represent fluid or inflammation in the upper abdomen. Laparotomy is noted.  IMPRESSION: Low positioning of the transverse colon may represent mass effect following surgery. Fluid or inflammation is not excluded. CT of the abdomen pelvis may be useful for further evaluation if clinically indicated.   Electronically Signed   By: Lawrence Santiago M.D.   On: 09/02/2014 13:55   Dg Abd Portable 2v  09/15/2014   CLINICAL DATA:  Vomiting, nasogastric tube placement  EXAM: PORTABLE ABDOMEN - 2 VIEW  COMPARISON:  09/02/2014  FINDINGS: Nasogastric tube terminates over the gastric body expected location. Suture line is present over the left mid abdomen. No dilated loop of large or small gas-filled bowel. Clips are noted over the mid abdomen. No acute osseous finding. No free air identified.  IMPRESSION: Nasogastric tube tip terminates over the expected location of the body of the stomach.   Electronically Signed   By: Conchita Paris M.D.   On: 09/15/2014 01:37    ASSESSMENT AND PLAN  68 yo female who recently under whipple's procedure for pancreatic CA came in with gastroparesis, CT of abdomen on 12/25 reveal incidental finding of RLL PE as well. Currently placed on amio gtt, expect discharge on home flecainide. On full dose lovenox,   1. Pancreatic CA s/p Whipple and pancreatic stent 08/30/14, readmitted with gastroparesis  - unable to tolerate PO, continue IV amiodarone and lovanox  - no recurrent a-fib so far, expect to transition to PO med once can tolerate solid food. Will need to readdress whether or not to restart Xarelto vs change to another medication as she had PE while on xarelto (although she was off of xarelto for some period of time perioperatively around  whipple)  2. Acute PE  - had PE while on xarelto 3. H/o PAF, currently maintaining NSR 4. Severe protein calorie malnutrition, on TPN 5. Obesity Body mass index is 31.54 kg/(m^2). 6. HTN, controlled at present time 7. Anemia 8. Hyperglycemia  Signed, Almyra Deforest PA-C Pager: [phone] Agree. Oral intake still poor. Wait another day to resume PO antiarrhythmic.  Sanda Klein, MD, [facility] [phone] office [phone] pager

## 2014-09-21 NOTE — Progress Notes (Signed)
PARENTERAL NUTRITION CONSULT NOTE - FOLLOW UP  Pharmacy Consult for TPN Indication: Gastric emptying issues/ileus with poor PO intake s/p Whipple Procedure  Allergies  Allergen Reactions  . Ace Inhibitors     Cough  . Codeine Other (See Comments)    Stomach pain  . Morphine And Related Other (See Comments)    Severe headache. Pt has not had since 90s and is ok with taking morphine.  . Sulfa Antibiotics Hives    Patient Measurements: Height: 5\' 3"  (160 cm) Weight: 174 lb 3.2 oz (79.017 kg) IBW/kg (Calculated) : 52.4 Adjusted Body Weight:  Usual Weight:   Vital Signs: Temp: 98 F (36.7 C) (12/30 0511) Temp Source: Oral (12/30 0511) BP: 106/56 mmHg (12/30 0511) Pulse Rate: 68 (12/30 0511) Intake/Output from previous day: 12/29 0701 - 12/30 0700 In: 9975.4 [P.O.:240; I.V.:2179.6; TPN:7555.8] Out: -  Intake/Output from this shift:    Labs:  Recent Labs  09/19/14 0545  WBC 8.2  HGB 9.5*  HCT 28.9*  PLT 252     Recent Labs  09/19/14 0545 09/20/14 0445 09/21/14 0509  NA 132* 130*  --   K 4.6 4.0  --   CL 101 98  --   CO2 23 26  --   GLUCOSE 122* 149*  --   BUN 16 16  --   CREATININE 0.51 0.56 0.48*  CALCIUM 8.2* 8.1*  --   MG 2.0  --   --   PHOS 4.2  --   --   PROT 5.4*  --   --   ALBUMIN 2.5*  --   --   AST 34  --   --   ALT 28  --   --   ALKPHOS 72  --   --   BILITOT 0.2*  --   --    Estimated Creatinine Clearance: 67.9 mL/min (by C-G formula based on Cr of 0.48).    Recent Labs  09/20/14 2154 09/21/14 0334 09/21/14 0610  GLUCAP 139* 162* 150*    Medications:  Scheduled:  . enoxaparin (LOVENOX) injection  80 mg Subcutaneous Q12H  . insulin aspart  0-9 Units Subcutaneous 4 times per day  . metoprolol  5 mg Intravenous 4 times per day  . pantoprazole (PROTONIX) IV  40 mg Intravenous QHS    Insulin Requirements in the past 24 hours:  4 units Sens SSI, 12 units insulin in TPN  Current Nutrition:  Clinimix E 5/15 at 40ml/hr and 20%  lipids at 10 ml/hr provides 1894 kcal and 100 gm protein  Nutritional Goals: (per RD noted 12/24) Kcal: 1900-2100 Protein: 105-115 grams   Assessment: Admitted with N/V; s/p Whipple procedure for pancreatic cancer 12/8. Since surgery, patient states she has not been eating a full diet as she has not been able to tolerate it. She described her intake as chicken noodle soup, toast, scrambled eggs. Pt reports weight loss with her usual body weight of ~215 lbs back in March 2015. Per Epic weight records, pt with a 2.7% weight loss in 1 week. PICC line placed  GI: Hx GERD, diverticulosis, chronic gastritis, ischemic colitis. NG clamped, no n/v  CT (-)obstruction. PPI IV, Metoclopramide. Some loose stools noted.  Clear liquid diet ordered, good appetite but 50-75% of meals  Endo: (-)hx DM. CBGs 139-165  Lytes: No lytes today  Renal: Cr stable, I/O not charted appropriately.  Pulm: RA  Cards: Hx HTN, Afib. VSS. IV lopressor, amiodarone  AC: (+)PE, (-)DVT. Lovenox  Hepatobil: LFTs wnl.  TG 79.  Neuro: Anxiety improved on Ativan  ID: Afeb, WBC wnl, on no antibiotics  Best Practices: Enoxaparin for PE TPN Access: PICC 12/24 TPN day#: 12/24 >>  Plan: - Decrease Clinimix E 5/15 to 30ml/hr and lipids at 85ml/hr - Cont insulin 12 units/bag (pt will receive ~7 units daily)  -f/u advance diet  Thanks for allowing pharmacy to be a part of this patient's care.  Excell Seltzer, PharmD Clinical Pharmacist, [phone]

## 2014-09-22 LAB — MAGNESIUM: Magnesium: 1.9 mg/dL (ref 1.5–2.5)

## 2014-09-22 LAB — GLUCOSE, CAPILLARY
GLUCOSE-CAPILLARY: 133 mg/dL — AB (ref 70–99)
GLUCOSE-CAPILLARY: 156 mg/dL — AB (ref 70–99)
GLUCOSE-CAPILLARY: 137 mg/dL — AB (ref 70–99)
Glucose-Capillary: 136 mg/dL — ABNORMAL HIGH (ref 70–99)
Glucose-Capillary: 140 mg/dL — ABNORMAL HIGH (ref 70–99)

## 2014-09-22 LAB — COMPREHENSIVE METABOLIC PANEL
ALT: 55 U/L — ABNORMAL HIGH (ref 0–35)
ANION GAP: 6 (ref 5–15)
AST: 50 U/L — ABNORMAL HIGH (ref 0–37)
Albumin: 2.3 g/dL — ABNORMAL LOW (ref 3.5–5.2)
Alkaline Phosphatase: 89 U/L (ref 39–117)
BILIRUBIN TOTAL: 0.3 mg/dL (ref 0.3–1.2)
BUN: 15 mg/dL (ref 6–23)
CHLORIDE: 101 meq/L (ref 96–112)
CO2: 25 mmol/L (ref 19–32)
CREATININE: 0.62 mg/dL (ref 0.50–1.10)
Calcium: 8.4 mg/dL (ref 8.4–10.5)
Glucose, Bld: 137 mg/dL — ABNORMAL HIGH (ref 70–99)
Potassium: 4.1 mmol/L (ref 3.5–5.1)
Sodium: 132 mmol/L — ABNORMAL LOW (ref 135–145)
Total Protein: 4.9 g/dL — ABNORMAL LOW (ref 6.0–8.3)

## 2014-09-22 LAB — PHOSPHORUS: Phosphorus: 4.6 mg/dL (ref 2.3–4.6)

## 2014-09-22 MED ORDER — METOCLOPRAMIDE HCL 5 MG/ML IJ SOLN
5.0000 mg | Freq: Three times a day (TID) | INTRAMUSCULAR | Status: DC
  Administered 2014-09-22 – 2014-09-25 (×9): 5 mg via INTRAVENOUS
  Filled 2014-09-22 (×13): qty 1

## 2014-09-22 MED ORDER — FAT EMULSION 20 % IV EMUL
250.0000 mL | INTRAVENOUS | Status: AC
  Administered 2014-09-22: 250 mL via INTRAVENOUS
  Filled 2014-09-22: qty 250

## 2014-09-22 MED ORDER — ACETAMINOPHEN 325 MG PO TABS
325.0000 mg | ORAL_TABLET | Freq: Four times a day (QID) | ORAL | Status: DC | PRN
  Administered 2014-09-25: 325 mg via ORAL
  Administered 2014-09-26: 650 mg via ORAL
  Filled 2014-09-22 (×2): qty 2

## 2014-09-22 MED ORDER — TRACE MINERALS CR-CU-F-FE-I-MN-MO-SE-ZN IV SOLN
INTRAVENOUS | Status: AC
  Administered 2014-09-22: 17:00:00 via INTRAVENOUS
  Filled 2014-09-22: qty 2000

## 2014-09-22 NOTE — Progress Notes (Signed)
  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 No arrhythmia.  CURRENT MEDS . enoxaparin (LOVENOX) injection  80 mg Subcutaneous Q12H  . feeding supplement (RESOURCE BREEZE)  1 Container Oral TID BM  . insulin aspart  0-9 Units Subcutaneous 4 times per day  . metoCLOPramide (REGLAN) injection  5 mg Intravenous 3 times per day  . metoprolol  5 mg Intravenous 4 times per day  . pantoprazole (PROTONIX) IV  40 mg Intravenous QHS    OBJECTIVE   Intake/Output Summary (Last 24 hours) at 09/22/14 0950 Last data filed at 09/21/14 1809  Gross per 24 hour  Intake    480 ml  Output      0 ml  Net    480 ml   Filed Weights   09/14/14 1600 09/20/14 0700  Weight: 178 lb (80.74 kg) 174 lb 3.2 oz (79.017 kg)    PHYSICAL EXAM Filed Vitals:   09/21/14 2349 09/22/14 0023 09/22/14 0130 09/22/14 0600  BP: 126/64 107/66 108/60 106/56  Pulse: 68 62 58 58  Temp:    98.1 F (36.7 C)  TempSrc:    Oral  Resp:    18  Height:      Weight:      SpO2:    98%    CBC No results for input(s): WBC, NEUTROABS, HGB, HCT, MCV, PLT in the last 72 hours. Basic Metabolic Panel  Recent Labs  09/20/14 0445 09/21/14 0509  NA 130*  --   K 4.0  --   CL 98  --   CO2 26  --   GLUCOSE 149*  --   BUN 16  --   CREATININE 0.56 0.48*  CALCIUM 8.1*  --    Liver Function Tests No results for input(s): AST, ALT, ALKPHOS, BILITOT, PROT, ALBUMIN in the last 72 hours. No results for input(s): LIPASE, AMYLASE in the last 72 hours.  TELE NSR  ASSESSMENT AND PLAN  Will sign off for now. Please let us know when she is taking full PO to resume her usual antiarrhythmics.   Sanda Klein, MD, [facility] [phone] office [phone] pager 09/22/2014 9:50 AM

## 2014-09-22 NOTE — Progress Notes (Signed)
PHARMACY NOTE  Pharmacy Consult :  68 y.o. female is currently on Lovenox for RLL Pulmonary Embolus.   Dosing Wt :  80 kg  Hematology : Hemoglobin & Hematocrit     Component Value Date/Time   HGB 9.5* 09/19/2014 0545   HCT 28.9* 09/19/2014 0545    Recent Labs  09/20/14 0445 09/21/14 0509  CREATININE 0.56 0.48*    Current Medication[s] Include: Scheduled:  Scheduled:  . enoxaparin (LOVENOX) injection  80 mg Subcutaneous Q12H  . feeding supplement (RESOURCE BREEZE)  1 Container Oral TID BM  . insulin aspart  0-9 Units Subcutaneous 4 times per day  . metoCLOPramide (REGLAN) injection  5 mg Intravenous 3 times per day  . metoprolol  5 mg Intravenous 4 times per day  . pantoprazole (PROTONIX) IV  40 mg Intravenous QHS   Infusion[s]: Infusions:  . amiodarone 30 mg/hr (09/22/14 0041)  . Marland KitchenTPN (CLINIMIX-E) Adult 50 mL/hr at 09/21/14 1809   And  . fat emulsion 250 mL (09/21/14 1809)    Assessment :  68 y/o female s/p Whipple's for Pancreatic Cancer.  Patient continues to have emesis when trying to wean TNA to oral feedings.  Patient placed back on clear liquids..  Lovenox is continuing at therapeutic doses, 80 mg sq q 12 hours.  No evidence of bleeding complications observed.  Goal :  Lovenox Anti-Xa level 0.6-1 units/ml 4hrs after LMWH dose given  Plan : 1. Continue Lovenox 80 mg sq q 12 hours. 2. Will check CBC with AM labs.  3. Monitor for bleeding complications.  Follow Platlet counts.  Enez Monahan, Craig Guess,  Pharm.D  09/22/2014  10:35 AM

## 2014-09-22 NOTE — Progress Notes (Signed)
Patient had loose BM along with an episode of vomiting around midnight per night shift RN.  Patient continues to c/o nausea. No bowel sounds present during morning assessment. Patient denies passing flatus. Nausea: patient notes she's willing to try anything, including phenergan but will not do another NG tube. Bowel Movements: spouse notes he would like to know if suppositories would be beneficial at this time. Patient and spouse were encouraged to discuss concerns with MD when rounds were made today. RN will also leave a sticky note on patient's chart. Alfredo Bach RN BSN 09/22/2014 8:20 AM

## 2014-09-22 NOTE — Progress Notes (Signed)
PARENTERAL NUTRITION CONSULT NOTE - FOLLOW UP  Pharmacy Consult for TPN Indication: Gastric emptying issues/ileus with poor PO intake s/p Whipple Procedure  Allergies  Allergen Reactions  . Ace Inhibitors     Cough  . Codeine Other (See Comments)    Stomach pain  . Morphine And Related Other (See Comments)    Severe headache. Pt has not had since 90s and is ok with taking morphine.  . Sulfa Antibiotics Hives    Patient Measurements: Height: 5\' 3"  (160 cm) Weight: 174 lb 3.2 oz (79.017 kg) IBW/kg (Calculated) : 52.4 Adjusted Body Weight:  Usual Weight:   Vital Signs: Temp: 98.1 F (36.7 C) (12/31 0600) Temp Source: Oral (12/31 0600) BP: 106/56 mmHg (12/31 0600) Pulse Rate: 58 (12/31 0600) Intake/Output from previous day: 12/30 0701 - 12/31 0700 In: 720 [P.O.:720] Out: -  Intake/Output from this shift: Total I/O In: 240 [P.O.:240] Out: -   Labs: No results for input(s): WBC, HGB, HCT, PLT, APTT, INR in the last 72 hours.   Recent Labs  09/20/14 0445 09/21/14 0509 09/22/14 0520  NA 130*  --  132*  K 4.0  --  4.1  CL 98  --  101  CO2 26  --  25  GLUCOSE 149*  --  137*  BUN 16  --  15  CREATININE 0.56 0.48* 0.62  CALCIUM 8.1*  --  8.4  MG  --   --  1.9  PHOS  --   --  4.6  PROT  --   --  4.9*  ALBUMIN  --   --  2.3*  AST  --   --  50*  ALT  --   --  55*  ALKPHOS  --   --  89  BILITOT  --   --  0.3   Estimated Creatinine Clearance: 67.9 mL/min (by C-G formula based on Cr of 0.62).    Recent Labs  09/21/14 1114 09/21/14 1612 09/21/14 2339  GLUCAP 165* 128* 136*    Medications:  Scheduled:  . enoxaparin (LOVENOX) injection  80 mg Subcutaneous Q12H  . feeding supplement (RESOURCE BREEZE)  1 Container Oral TID BM  . insulin aspart  0-9 Units Subcutaneous 4 times per day  . metoCLOPramide (REGLAN) injection  5 mg Intravenous 3 times per day  . metoprolol  5 mg Intravenous 4 times per day  . pantoprazole (PROTONIX) IV  40 mg Intravenous QHS     Insulin Requirements in the past 24 hours:  5 units Sens SSI, 12 units insulin in TPN  Current Nutrition:  Clinimix E 5/15 at 87ml/hr and 20% lipids at 10 ml/hr provides 1894 kcal and 100 gm protein  Nutritional Goals: (per RD noted 12/24) Kcal: 1900-2100 Protein: 105-115 grams   Assessment: Admitted with N/V; s/p Whipple procedure for pancreatic cancer 12/8. Since surgery, patient states she has not been eating a full diet as she has not been able to tolerate it. She described her intake as chicken noodle soup, toast, scrambled eggs. Pt reports weight loss with her usual body weight of ~215 lbs back in March 2015. Per Epic weight records, pt with a 2.7% weight loss in 1 week. PICC line placed  GI: Hx GERD, diverticulosis, chronic gastritis, ischemic colitis. NG clamped, V x 4 & BM x 4 yest.  CT (-)obstruction. PPI IV, Metoclopramide. Cont clears.  Endo: (-)hx DM. CBGs 128-138  Lytes: Na 132, K 4.1 (goal >4 with possible ileus), Mg 1.9, Phos 4.6  Renal:  Cr stable, I/O not charted appropriately.  Pulm: RA  Cards: Hx HTN, Afib. VSS. IV lopressor, amiodarone  AC: (+)PE, (-)DVT. Lovenox  Hepatobil: AST & ALT mildly increased. TG 79.  Neuro: Anxiety improved on Ativan  ID: Afeb, WBC wnl, on no antibiotics  Best Practices: Enoxaparin for PE TPN Access: PICC 12/24 TPN day#: 12/24 >>  Plan: - Continue Clinimix E 5/15 at 30ml/hr and lipids at 65ml/hr - Cont insulin 12 units/bag (pt will receive ~11 units daily)   Gracy Bruins, PharmD Clinical Pharmacist [facility]

## 2014-09-22 NOTE — Progress Notes (Signed)
Patient ID: [name], female   DOB: November 07, 1945, 68 y.o.   MRN: [id number]    Subjective: 4 episodes of emesis yesterday.    Objective: Vital signs in last 24 hours: Temp:  [98.1 F (36.7 C)-98.6 F (37 C)] 98.1 F (36.7 C) (12/31 0600) Pulse Rate:  [58-72] 58 (12/31 0600) Resp:  [18] 18 (12/31 0600) BP: (97-126)/(56-66) 106/56 mmHg (12/31 0600) SpO2:  [98 %-99 %] 98 % (12/31 0600) Last BM Date: 09/22/14  Intake/Output from previous day: 12/30 0701 - 12/31 0700 In: 720 [P.O.:720] Out: -  Intake/Output this shift:   A&O x 3.  Very anxious.   Abdomen soft, nt, nd  Lab Results:  No results for input(s): WBC, HGB, HCT, PLT in the last 72 hours. BMET  Recent Labs  09/20/14 0445 09/21/14 0509  NA 130*  --   K 4.0  --   CL 98  --   CO2 26  --   GLUCOSE 149*  --   BUN 16  --   CREATININE 0.56 0.48*  CALCIUM 8.1*  --    PT/INR No results for input(s): LABPROT, INR in the last 72 hours. ABG No results for input(s): PHART, HCO3 in the last 72 hours.  Invalid input(s): PCO2, PO2  Studies/Results: No results found.  Anti-infectives: Anti-infectives    None      Assessment/Plan:  Gastric ileus PE  Go back to clears.  Try reglan Ambulate Continue anticoagulation and amio per cardiology. TNA for protein calorie malnutrition.      LOS: 8 days    [facility] 09/22/2014

## 2014-09-22 NOTE — Progress Notes (Signed)
Medicare Important Message given? YES  (If response is "NO", the following Medicare IM given date fields will be blank)  Date Medicare IM given: 09/22/14 Medicare IM given by:  Raya Mckinstry  

## 2014-09-23 LAB — CBC
HEMATOCRIT: 27 % — AB (ref 36.0–46.0)
Hemoglobin: 8.8 g/dL — ABNORMAL LOW (ref 12.0–15.0)
MCH: 28.8 pg (ref 26.0–34.0)
MCHC: 32.6 g/dL (ref 30.0–36.0)
MCV: 88.2 fL (ref 78.0–100.0)
Platelets: 202 10*3/uL (ref 150–400)
RBC: 3.06 MIL/uL — AB (ref 3.87–5.11)
RDW: 13.9 % (ref 11.5–15.5)
WBC: 4 10*3/uL (ref 4.0–10.5)

## 2014-09-23 LAB — GLUCOSE, CAPILLARY
GLUCOSE-CAPILLARY: 148 mg/dL — AB (ref 70–99)
GLUCOSE-CAPILLARY: 149 mg/dL — AB (ref 70–99)
Glucose-Capillary: 131 mg/dL — ABNORMAL HIGH (ref 70–99)
Glucose-Capillary: 136 mg/dL — ABNORMAL HIGH (ref 70–99)
Glucose-Capillary: 153 mg/dL — ABNORMAL HIGH (ref 70–99)

## 2014-09-23 MED ORDER — PROMETHAZINE HCL 25 MG/ML IJ SOLN: 12.5000 mg | Freq: Four times a day (QID) | INTRAMUSCULAR | Status: DC | PRN

## 2014-09-23 MED ORDER — TRACE MINERALS CR-CU-F-FE-I-MN-MO-SE-ZN IV SOLN
INTRAVENOUS | Status: AC
  Administered 2014-09-23: 17:00:00 via INTRAVENOUS
  Filled 2014-09-23: qty 2000

## 2014-09-23 MED ORDER — FAT EMULSION 20 % IV EMUL
240.0000 mL | INTRAVENOUS | Status: AC
  Administered 2014-09-23: 240 mL via INTRAVENOUS
  Filled 2014-09-23: qty 250

## 2014-09-23 NOTE — Progress Notes (Signed)
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 Passed several formed stools. No arrhythmia.  CURRENT MEDS . enoxaparin (LOVENOX) injection  80 mg Subcutaneous Q12H  . feeding supplement (RESOURCE BREEZE)  1 Container Oral TID BM  . insulin aspart  0-9 Units Subcutaneous 4 times per day  . metoCLOPramide (REGLAN) injection  5 mg Intravenous 3 times per day  . pantoprazole (PROTONIX) IV  40 mg Intravenous QHS    OBJECTIVE   Intake/Output Summary (Last 24 hours) at 09/23/14 0932 Last data filed at 09/22/14 1800  Gross per 24 hour  Intake    840 ml  Output      1 ml  Net    839 ml   Filed Weights   09/14/14 1600 09/20/14 0700  Weight: 178 lb (80.74 kg) 174 lb 3.2 oz (79.017 kg)    PHYSICAL EXAM Filed Vitals:   09/22/14 2004 09/22/14 2320 09/23/14 0421 09/23/14 0542  BP: 110/63 85/54 106/51   Pulse: 59 60 63 59  Temp: 98.6 F (37 C)  98.3 F (36.8 C)   TempSrc: Oral  Oral   Resp: 18  18   Height:      Weight:      SpO2: 100%  98%    General: Alert, oriented x3, no distress Head: no evidence of trauma, PERRL, EOMI, no exophtalmos or lid lag, no myxedema, no xanthelasma; normal ears, nose and oropharynx Neck: normal jugular venous pulsations and no hepatojugular reflux; brisk carotid pulses without delay and no carotid bruits Chest: clear to auscultation, no signs of consolidation by percussion or palpation, normal fremitus, symmetrical and full respiratory excursions Cardiovascular: normal position and quality of the apical impulse, regular rhythm, normal first and second heart sounds, no rubs or gallops, no  murmur Abdomen: no tenderness or distention, no masses by palpation, no abnormal pulsatility or arterial bruits, normal bowel sounds, no hepatosplenomegaly Extremities: no clubbing, cyanosis or edema; 2+ radial, ulnar and brachial pulses bilaterally; 2+ right femoral, posterior tibial and dorsalis pedis pulses; 2+ left femoral, posterior tibial and dorsalis pedis pulses; no subclavian or femoral bruits Neurological: grossly nonfocal  LABS  CBC  Recent Labs  09/23/14 0500  WBC 4.0  HGB 8.8*  HCT 27.0*  MCV 88.2  PLT 606   Basic Metabolic Panel  Recent Labs  09/21/14 0509 09/22/14 0520  NA  --  132*  K  --  4.1  CL  --  101  CO2  --  25  GLUCOSE  --  137*  BUN  --  15  CREATININE 0.48* 0.62  CALCIUM  --  8.4  MG  --  1.9  PHOS  --  4.6   Liver Function Tests  Recent Labs  09/22/14 0520  AST 50*  ALT 55*  ALKPHOS 89  BILITOT 0.3  PROT 4.9*  ALBUMIN 2.3*   Radiology Studies Imaging results have been reviewed and No results found.  TELE NSR  ASSESSMENT AND PLAN  Absorption of antiarrhythmic and (especially) Xarelto is very dependent on food intake. Continue parenteral meds (amiodarone, enoxaparin) at least until she is tolerating full liquid diet  for >24 hours, then switch back to flecainide 100 mg BID and xarelto 20 mg daily with evening meal.  Sanda Klein, MD, [facility] [phone] office [phone] pager 09/23/2014 9:32 AM

## 2014-09-23 NOTE — Progress Notes (Signed)
PARENTERAL NUTRITION CONSULT NOTE - FOLLOW UP  Pharmacy Consult for TPN Indication: Gastric emptying issues/ileus with poor PO intake s/p Whipple Procedure  Allergies  Allergen Reactions  . Ace Inhibitors     Cough  . Codeine Other (See Comments)    Stomach pain  . Morphine And Related Other (See Comments)    Severe headache. Pt has not had since 90s and is ok with taking morphine.  . Sulfa Antibiotics Hives    Patient Measurements: Height: 5\' 3"  (160 cm) Weight: 174 lb 3.2 oz (79.017 kg) IBW/kg (Calculated) : 52.4 Adjusted Body Weight:  Usual Weight:   Vital Signs: Temp: 98.3 F (36.8 C) (01/01 0421) Temp Source: Oral (01/01 0421) BP: 106/51 mmHg (01/01 0421) Pulse Rate: 59 (01/01 0542) Intake/Output from previous day: 12/31 0701 - 01/01 0700 In: 1080 [P.O.:1080] Out: 1 [Stool:1] Intake/Output from this shift:    Labs:  Recent Labs  09/23/14 0500  WBC 4.0  HGB 8.8*  HCT 27.0*  PLT 202     Recent Labs  09/21/14 0509 09/22/14 0520  NA  --  132*  K  --  4.1  CL  --  101  CO2  --  25  GLUCOSE  --  137*  BUN  --  15  CREATININE 0.48* 0.62  CALCIUM  --  8.4  MG  --  1.9  PHOS  --  4.6  PROT  --  4.9*  ALBUMIN  --  2.3*  AST  --  50*  ALT  --  55*  ALKPHOS  --  89  BILITOT  --  0.3   Estimated Creatinine Clearance: 67.9 mL/min (by C-G formula based on Cr of 0.62).    Recent Labs  09/22/14 2318 09/23/14 0418 09/23/14 0545  GLUCAP 137* 148* 131*    Medications:  Scheduled:  . enoxaparin (LOVENOX) injection  80 mg Subcutaneous Q12H  . feeding supplement (RESOURCE BREEZE)  1 Container Oral TID BM  . insulin aspart  0-9 Units Subcutaneous 4 times per day  . metoCLOPramide (REGLAN) injection  5 mg Intravenous 3 times per day  . metoprolol  5 mg Intravenous 4 times per day  . pantoprazole (PROTONIX) IV  40 mg Intravenous QHS    Insulin Requirements in the past 24 hours:  4 units Sens SSI, 12 units insulin in TPN  Current Nutrition:   Clinimix E 5/15 at 28ml/hr and 20% lipids at 10 ml/hr provides 1894 kcal and 100 gm protein  Nutritional Goals: (per RD noted 12/24) Kcal: 1900-2100 Protein: 105-115 grams   Assessment: Admitted with N/V; s/p Whipple procedure for pancreatic cancer 12/8.  No nausea/vomiting in last 24hr.    GI: Hx GERD, diverticulosis, chronic gastritis, ischemic colitis. Abd soft, NT/ND.  NG clamped, no vomiting/BMs on 12/31.  CT (-)obstruction. PPI IV, Metoclopramide. CL diet.  Endo: (-)hx DM. CBGs < 150, controlled.  SSI q8  Lytes: 12/31: Na 132, K 4.1 (goal >4 with possible ileus), Mg 1.9, Phos 4.6.  No labs today.  Renal: Cr has been stable, I/O not charted appropriately.  Pulm: RA.    Cards: Hx HTN, Afib. VSS. IV lopressor, amiodarone  AC: (+)PE, (-)DVT. Lovenox  Hepatobil: AST & ALT mildly increased. TG 79.  Neuro: Anxiety improved on Ativan  ID: Afeb, WBC wnl, on no antibiotics  Best Practices: Enoxaparin for PE TPN Access: PICC 12/24 TPN day#: 12/24 >>  Plan: - Continue Clinimix E 5/15 at 32ml/hr and lipids at 37ml/hr - Cont insulin 12  units/bag (pt will receive ~11 units daily)   Gracy Bruins, PharmD Clinical Pharmacist [facility]

## 2014-09-23 NOTE — Progress Notes (Signed)
Patient ID: [name], female   DOB: [birth year], 69 y.o.   MRN: [id number]    Subjective: No n/v.    Objective: Vital signs in last 24 hours: Temp:  [98.2 F (36.8 C)-98.6 F (37 C)] 98.3 F (36.8 C) (01/01 0421) Pulse Rate:  [59-63] 59 (01/01 0542) Resp:  [16-18] 18 (01/01 0421) BP: (85-120)/(51-70) 106/51 mmHg (01/01 0421) SpO2:  [98 %-100 %] 98 % (01/01 0421) Last BM Date: 09/22/14  Intake/Output from previous day: 12/31 0701 - 01/01 0700 In: 1080 [P.O.:1080] Out: 1 [Stool:1] Intake/Output this shift:   A&O x 3.  Anxiety diminished Abdomen soft, nt, nd  Lab Results:   Recent Labs  09/23/14 0500  WBC 4.0  HGB 8.8*  HCT 27.0*  PLT 202   BMET  Recent Labs  09/21/14 0509 09/22/14 0520  NA  --  132*  K  --  4.1  CL  --  101  CO2  --  25  GLUCOSE  --  137*  BUN  --  15  CREATININE 0.48* 0.62  CALCIUM  --  8.4   PT/INR No results for input(s): LABPROT, INR in the last 72 hours. ABG No results for input(s): PHART, HCO3 in the last 72 hours.  Invalid input(s): PCO2, PO2  Studies/Results: No results found.  Anti-infectives: Anti-infectives    None      Assessment/Plan:  Gastric ileus PE  Try full liquids again.   Ambulate Continue anticoagulation and amio per cardiology. TNA for protein calorie malnutrition.      LOS: 9 days    [name] 09/23/2014

## 2014-09-24 LAB — GLUCOSE, CAPILLARY
GLUCOSE-CAPILLARY: 157 mg/dL — AB (ref 70–99)
GLUCOSE-CAPILLARY: 122 mg/dL — AB (ref 70–99)
Glucose-Capillary: 148 mg/dL — ABNORMAL HIGH (ref 70–99)

## 2014-09-24 MED ORDER — TRACE MINERALS CR-CU-F-FE-I-MN-MO-SE-ZN IV SOLN
INTRAVENOUS | Status: DC
  Administered 2014-09-24: 18:00:00 via INTRAVENOUS
  Filled 2014-09-24: qty 2000

## 2014-09-24 MED ORDER — FAT EMULSION 20 % IV EMUL
240.0000 mL | INTRAVENOUS | Status: DC
  Administered 2014-09-24: 240 mL via INTRAVENOUS
  Filled 2014-09-24: qty 250

## 2014-09-24 MED ORDER — RIVAROXABAN 15 MG PO TABS
15.0000 mg | ORAL_TABLET | Freq: Two times a day (BID) | ORAL | Status: AC
  Administered 2014-09-24 (×2): 15 mg via ORAL
  Filled 2014-09-24 (×2): qty 1

## 2014-09-24 MED ORDER — DM-GUAIFENESIN ER 30-600 MG PO TB12
1.0000 | ORAL_TABLET | Freq: Two times a day (BID) | ORAL | Status: DC
  Administered 2014-09-24 – 2014-09-26 (×5): 1 via ORAL
  Filled 2014-09-24 (×6): qty 1

## 2014-09-24 MED ORDER — FLECAINIDE ACETATE 100 MG PO TABS
100.0000 mg | ORAL_TABLET | Freq: Two times a day (BID) | ORAL | Status: DC
  Administered 2014-09-24 – 2014-09-26 (×5): 100 mg via ORAL
  Filled 2014-09-24 (×6): qty 1

## 2014-09-24 MED ORDER — RIVAROXABAN 15 MG PO TABS
15.0000 mg | ORAL_TABLET | Freq: Two times a day (BID) | ORAL | Status: DC
  Filled 2014-09-24: qty 1

## 2014-09-24 MED ORDER — RIVAROXABAN 20 MG PO TABS
20.0000 mg | ORAL_TABLET | Freq: Every day | ORAL | Status: DC
Start: 2014-10-15 — End: 2014-09-26

## 2014-09-24 MED ORDER — RIVAROXABAN 15 MG PO TABS
15.0000 mg | ORAL_TABLET | Freq: Two times a day (BID) | ORAL | Status: DC
  Administered 2014-09-25 – 2014-09-26 (×3): 15 mg via ORAL
  Filled 2014-09-24 (×6): qty 1

## 2014-09-24 NOTE — Progress Notes (Signed)
Patient ID: [name], female   DOB: [birth year], 69 y.o.   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 Passed several formed stools. No arrhythmia.  CURRENT MEDS . dextromethorphan-guaiFENesin  1 tablet Oral BID  . feeding supplement (RESOURCE BREEZE)  1 Container Oral TID BM  . flecainide  100 mg Oral Q12H  . insulin aspart  0-9 Units Subcutaneous 4 times per day  . metoCLOPramide (REGLAN) injection  5 mg Intravenous 3 times per day  . pantoprazole (PROTONIX) IV  40 mg Intravenous QHS    OBJECTIVE   Intake/Output Summary (Last 24 hours) at 09/24/14 1030 Last data filed at 09/24/14 1010  Gross per 24 hour  Intake   1060 ml  Output      0 ml  Net   1060 ml   Filed Weights   09/14/14 1600 09/20/14 0700  Weight: 80.74 kg (178 lb) 79.017 kg (174 lb 3.2 oz)    PHYSICAL EXAM Filed Vitals:   09/23/14 1352 09/23/14 1427 09/23/14 1951 09/24/14 0404  BP: 88/41 95/51 128/78 112/62  Pulse: 66  77 64  Temp: 98.5 F (36.9 C)  98.4 F (36.9 C) 98.3 F (36.8 C)  TempSrc: Oral  Oral Oral  Resp: 18  18 18   Height:      Weight:      SpO2: 98%  100% 98%   General: Alert, oriented x3, no distress Head: no evidence of trauma, PERRL, EOMI, no exophtalmos or lid lag, no myxedema, no xanthelasma; normal ears, nose and oropharynx Neck: normal jugular venous pulsations and no hepatojugular reflux; brisk carotid pulses without delay and no carotid bruits Chest: clear to auscultation, no signs of consolidation by percussion or palpation, normal fremitus, symmetrical and full respiratory  excursions Cardiovascular: normal position and quality of the apical impulse, regular rhythm, normal first and second heart sounds, no rubs or gallops, no murmur Abdomen: no tenderness or distention, no masses by palpation, no abnormal pulsatility or arterial bruits, normal bowel sounds, no hepatosplenomegaly Extremities: no clubbing, cyanosis or edema; 2+ radial, ulnar and brachial pulses bilaterally; 2+ right femoral, posterior tibial and dorsalis pedis pulses; 2+ left femoral, posterior tibial and dorsalis pedis pulses; no subclavian or femoral bruits Neurological: grossly nonfocal  LABS  CBC  Recent Labs  09/23/14 0500  WBC 4.0  HGB 8.8*  HCT 27.0*  MCV 88.2  PLT 008   Basic Metabolic Panel  Recent Labs  09/22/14 0520  NA 132*  K 4.1  CL 101  CO2 25  GLUCOSE 137*  BUN 15  CREATININE 0.62  CALCIUM 8.4  MG 1.9  PHOS 4.6   Liver Function Tests  Recent Labs  09/22/14 0520  AST 50*  ALT 55*  ALKPHOS 89  BILITOT 0.3  PROT 4.9*  ALBUMIN 2.3*   Radiology Studies Imaging results have been reviewed and No results found.  TELE NSR  ASSESSMENT AND PLAN  Has tolerated PO yesterday and this am will d/c lovenox and amiodarone and restart home doses of flecainide and xarelto Follow Hct 27 on 1/1

## 2014-09-24 NOTE — Progress Notes (Signed)
PARENTERAL NUTRITION CONSULT NOTE - FOLLOW UP  Pharmacy Consult for TPN Indication: Gastric emptying issues/ileus with poor PO intake s/p Whipple Procedure  Allergies  Allergen Reactions  . Ace Inhibitors     Cough  . Codeine Other (See Comments)    Stomach pain  . Morphine And Related Other (See Comments)    Severe headache. Pt has not had since 90s and is ok with taking morphine.  . Sulfa Antibiotics Hives    Patient Measurements: Height: 5\' 3"  (160 cm) Weight: 174 lb 3.2 oz (79.017 kg) IBW/kg (Calculated) : 52.4 Adjusted Body Weight:  Usual Weight:   Vital Signs: Temp: 98.3 F (36.8 C) (01/02 0404) Temp Source: Oral (01/02 0404) BP: 112/62 mmHg (01/02 0404) Pulse Rate: 64 (01/02 0404) Intake/Output from previous day: 01/01 0701 - 01/02 0700 In: 360 [P.O.:360] Out: -  Intake/Output from this shift:    Labs:  Recent Labs  09/23/14 0500  WBC 4.0  HGB 8.8*  HCT 27.0*  PLT 202     Recent Labs  09/22/14 0520  NA 132*  K 4.1  CL 101  CO2 25  GLUCOSE 137*  BUN 15  CREATININE 0.62  CALCIUM 8.4  MG 1.9  PHOS 4.6  PROT 4.9*  ALBUMIN 2.3*  AST 50*  ALT 55*  ALKPHOS 89  BILITOT 0.3   Estimated Creatinine Clearance: 67.9 mL/min (by C-G formula based on Cr of 0.62).    Recent Labs  09/23/14 1711 09/23/14 2349 09/24/14 0601  GLUCAP 153* 149* 157*    Medications:  Scheduled:  . enoxaparin (LOVENOX) injection  80 mg Subcutaneous Q12H  . feeding supplement (RESOURCE BREEZE)  1 Container Oral TID BM  . insulin aspart  0-9 Units Subcutaneous 4 times per day  . metoCLOPramide (REGLAN) injection  5 mg Intravenous 3 times per day  . pantoprazole (PROTONIX) IV  40 mg Intravenous QHS    Insulin Requirements in the past 24 hours:  7 units Sens SSI, 12 units insulin in TPN  Current Nutrition:  Clinimix E 5/15 at 25ml/hr and 20% lipids at 10 ml/hr provides 1894 kcal and 100 gm protein  Nutritional Goals: (per RD noted 12/24) Kcal:  1900-2100 Protein: 105-115 grams   Assessment: Admitted with N/V; s/p Whipple procedure for pancreatic cancer 12/8.  No nausea/vomiting in last 24hr.    GI: Hx GERD, diverticulosis, chronic gastritis, ischemic colitis. Abd soft, NT/ND.  NG clamped, no vomiting/BMs on 12/31.  CT (-)obstruction. PPI IV, Metoclopramide. Full liquid  Endo: (-)hx DM. CBGs 131-157, mostly controlled.  SSI q8  Lytes: 12/31: Na 132, K 4.1 (goal >4 with possible ileus), Mg 1.9, Phos 4.6.  No labs today.  Renal: Cr has been stable, I/O not charted appropriately.  Pulm: RA.    Cards: Hx HTN, Afib. VSS. IV lopressor, amiodarone  AC: (+)PE, (-)DVT. Lovenox  Hepatobil: AST & ALT mildly increased. TG 79.  Neuro: Anxiety improved on Ativan  ID: Afeb, WBC wnl, on no antibiotics  Best Practices: Enoxaparin for PE TPN Access: PICC 12/24 TPN day#: 12/24 >>  Plan: - Continue Clinimix E 5/15 at 2ml/hr and lipids at 18ml/hr - Cont insulin 12 units/bag (pt will receive ~11 units daily)   Thanks for allowing pharmacy to be a part of this patient's care.  Excell Seltzer, PharmD Clinical Pharmacist, [phone]
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
## 2014-09-24 NOTE — Progress Notes (Signed)
Patient ID: Savannah DRAUGHON, female   DOB: 14-Aug-1946, 69 y.o.   MRN: [id number]    Subjective: No n/v.  Tolerated full liquids.    Objective: Vital signs in last 24 hours: Temp:  [98.3 F (36.8 C)-98.5 F (36.9 C)] 98.3 F (36.8 C) (01/02 0404) Pulse Rate:  [64-77] 64 (01/02 0404) Resp:  [18] 18 (01/02 0404) BP: (88-128)/(41-78) 112/62 mmHg (01/02 0404) SpO2:  [98 %-100 %] 98 % (01/02 0404) Last BM Date: 09/23/14  Intake/Output from previous day: 01/01 0701 - 01/02 0700 In: 360 [P.O.:360] Out: -  Intake/Output this shift:   A&O x 3.  Abdomen soft, nt, nd  Lab Results:   Recent Labs  09/23/14 0500  WBC 4.0  HGB 8.8*  HCT 27.0*  PLT 202   BMET  Recent Labs  09/22/14 0520  NA 132*  K 4.1  CL 101  CO2 25  GLUCOSE 137*  BUN 15  CREATININE 0.62  CALCIUM 8.4   PT/INR No results for input(s): LABPROT, INR in the last 72 hours. ABG No results for input(s): PHART, HCO3 in the last 72 hours.  Invalid input(s): PCO2, PO2  Studies/Results: No results found.  Anti-infectives: Anti-infectives    None      Assessment/Plan:  Gastric ileus PE  Soft solids.    Ambulate OK to convert to PO meds for antiarrhythmics.   TNA for protein calorie malnutrition.      LOS: 10 days    Shyrl Obi 09/24/2014

## 2014-09-25 LAB — COMPREHENSIVE METABOLIC PANEL
ALT: 76 U/L — ABNORMAL HIGH (ref 0–35)
AST: 53 U/L — ABNORMAL HIGH (ref 0–37)
Albumin: 2.3 g/dL — ABNORMAL LOW (ref 3.5–5.2)
Alkaline Phosphatase: 110 U/L (ref 39–117)
Anion gap: 6 (ref 5–15)
BUN: 15 mg/dL (ref 6–23)
CALCIUM: 8.1 mg/dL — AB (ref 8.4–10.5)
CO2: 25 mmol/L (ref 19–32)
Chloride: 101 mEq/L (ref 96–112)
Creatinine, Ser: 0.53 mg/dL (ref 0.50–1.10)
GFR calc Af Amer: >90 mL/min (ref >90)
GFR calc non Af Amer: >90 mL/min (ref >90)
GLUCOSE: 143 mg/dL — AB (ref 70–99)
Potassium: 4 mmol/L (ref 3.5–5.1)
Sodium: 132 mmol/L — ABNORMAL LOW (ref 135–145)
Total Bilirubin: 0.3 mg/dL (ref 0.3–1.2)
Total Protein: 5.2 g/dL — ABNORMAL LOW (ref 6.0–8.3)

## 2014-09-25 LAB — GLUCOSE, CAPILLARY
GLUCOSE-CAPILLARY: 105 mg/dL — AB (ref 70–99)
Glucose-Capillary: 137 mg/dL — ABNORMAL HIGH (ref 70–99)
Glucose-Capillary: 137 mg/dL — ABNORMAL HIGH (ref 70–99)
Glucose-Capillary: 153 mg/dL — ABNORMAL HIGH (ref 70–99)

## 2014-09-25 MED ORDER — LORAZEPAM 1 MG PO TABS
1.0000 mg | ORAL_TABLET | Freq: Four times a day (QID) | ORAL | Status: DC | PRN
  Administered 2014-09-25: 1 mg via ORAL
  Filled 2014-09-25: qty 1

## 2014-09-25 MED ORDER — PROMETHAZINE HCL 12.5 MG PO TABS: 12.5000 mg | ORAL_TABLET | Freq: Four times a day (QID) | ORAL | Status: DC | PRN

## 2014-09-25 MED ORDER — PROMETHAZINE HCL 25 MG PO TABS: 12.5000 mg | ORAL_TABLET | Freq: Four times a day (QID) | ORAL | Status: DC | PRN

## 2014-09-25 MED ORDER — METOCLOPRAMIDE HCL 10 MG PO TABS: 10.0000 mg | ORAL_TABLET | Freq: Three times a day (TID) | ORAL | Status: DC

## 2014-09-25 MED ORDER — DM-GUAIFENESIN ER 30-600 MG PO TB12: 1.0000 | ORAL_TABLET | Freq: Three times a day (TID) | ORAL | Status: DC

## 2014-09-25 MED ORDER — ONDANSETRON HCL 4 MG PO TABS: 4.0000 mg | ORAL_TABLET | Freq: Four times a day (QID) | ORAL | Status: DC | PRN

## 2014-09-25 MED ORDER — PANTOPRAZOLE SODIUM 40 MG PO TBEC
40.0000 mg | DELAYED_RELEASE_TABLET | Freq: Every day | ORAL | Status: DC
  Administered 2014-09-25: 40 mg via ORAL
  Filled 2014-09-25: qty 1

## 2014-09-25 MED ORDER — BOOST / RESOURCE BREEZE PO LIQD: 1.0000 | Freq: Three times a day (TID) | ORAL | Status: DC

## 2014-09-25 MED ORDER — TRACE MINERALS CR-CU-F-FE-I-MN-MO-SE-ZN IV SOLN
INTRAVENOUS | Status: DC
  Filled 2014-09-25: qty 2000

## 2014-09-25 MED ORDER — LORATADINE 10 MG PO TABS
10.0000 mg | ORAL_TABLET | Freq: Every day | ORAL | Status: DC
  Administered 2014-09-25 – 2014-09-26 (×2): 10 mg via ORAL
  Filled 2014-09-25 (×2): qty 1

## 2014-09-25 MED ORDER — METOCLOPRAMIDE HCL 10 MG PO TABS
10.0000 mg | ORAL_TABLET | Freq: Three times a day (TID) | ORAL | Status: DC
  Administered 2014-09-25 – 2014-09-26 (×5): 10 mg via ORAL
  Filled 2014-09-25 (×9): qty 1

## 2014-09-25 MED ORDER — ZOLPIDEM TARTRATE 5 MG PO TABS: 5.0000 mg | ORAL_TABLET | Freq: Every evening | ORAL | Status: DC | PRN

## 2014-09-25 MED ORDER — FAT EMULSION 20 % IV EMUL
240.0000 mL | INTRAVENOUS | Status: DC
  Filled 2014-09-25: qty 250

## 2014-09-25 NOTE — Discharge Instructions (Signed)
Delayed Gastric Emptying Gastroparesis is also called slowed stomach emptying (delayed gastric emptying). It is a condition in which the stomach takes too long to empty its contents. It often happens in people with diabetes or stomach surgery.  CAUSES  Delayed gastric emptying happens when nerves to the stomach are damaged or work improperly. When the nerves are damaged, the muscles of the stomach and intestines do not work normally. The movement of food is slowed or stopped. High blood glucose (sugar) causes changes in nerves and can damage the blood vessels that carry oxygen and nutrients to the nerves. RISK FACTORS  Diabetes.  Post-viral syndromes.  Eating disorders (anorexia, bulimia).  Surgery on the stomach or vagus nerve.  Gastroesophageal reflux disease (rarely).  Smooth muscle disorders (amyloidosis, scleroderma).  Metabolic disorders, including hypothyroidism.  Parkinson disease. SYMPTOMS   Heartburn.  Feeling sick to your stomach (nausea).  Vomiting of undigested food.  An early feeling of fullness when eating.  Weight loss.  Abdominal bloating.  Erratic blood glucose levels.  Lack of appetite.  Gastroesophageal reflux.  Spasms of the stomach wall. Complications can include:  Bacterial overgrowth in stomach. Food stays in the stomach and can ferment and cause bacteria to grow.  Weight loss due to difficulty digesting and absorbing nutrients.  Vomiting.  Obstruction in the stomach. Undigested food can harden and cause nausea and vomiting.  Blood glucose fluctuations caused by inconsistent food absorption. TREATMENT   Treatments may include:  Exercise.  Medicines to control nausea and vomiting.  Medicines to stimulate stomach muscles.  Changes in what and when you eat.  Having smaller meals more often.  Eating low-fiber forms of high-fiber foods, such as eating cooked vegetables instead of raw vegetables.  Eating low-fat  foods.  Consuming liquids, which are easier to digest.  In severe cases, feeding tubes and intravenous (IV) feeding may be needed.   SEEK MEDICAL CARE IF:   You have diabetes and you are having problems keeping your blood glucose in goal range.  You are having nausea, vomiting, bloating, or early feelings of fullness with eating.  Your symptoms do not change with a change in diet. Document Released: 09/09/2005 Document Revised: 01/04/2013 Document Reviewed: 02/16/2009 [facility] Patient Information 2015 [place], [place]. This information is not intended to replace advice given to you by your health care provider. Make sure you discuss any questions you have with your health care provider.

## 2014-09-25 NOTE — Progress Notes (Signed)
Patient ID: [name], female   DOB: 1945-10-22, 69 y.o.   MRN: [id number]    Subjective: No n/v with soft solids.    Objective: Vital signs in last 24 hours: Temp:  [98.5 F (36.9 C)-98.9 F (37.2 C)] 98.9 F (37.2 C) (01/03 0619) Pulse Rate:  [68-79] 79 (01/03 0619) Resp:  [16-18] 18 (01/03 0619) BP: (118-131)/(70-83) 131/83 mmHg (01/03 0619) SpO2:  [97 %-100 %] 97 % (01/03 0619) Last BM Date: 09/23/14  Intake/Output from previous day: 01/02 0701 - 01/03 0700 In: 940 [P.O.:940] Out: -  Intake/Output this shift:   A&O x 3.  Abdomen soft, nt, nd  Lab Results:   Recent Labs  09/23/14 0500  WBC 4.0  HGB 8.8*  HCT 27.0*  PLT 202   BMET  Recent Labs  09/25/14 0420  NA 132*  K 4.0  CL 101  CO2 25  GLUCOSE 143*  BUN 15  CREATININE 0.53  CALCIUM 8.1*   PT/INR No results for input(s): LABPROT, INR in the last 72 hours. ABG No results for input(s): PHART, HCO3 in the last 72 hours.  Invalid input(s): PCO2, PO2  Studies/Results: No results found.  Anti-infectives: Anti-infectives    None      Assessment/Plan:  Gastric ileus PE  Soft solids.    Ambulate D/c TNA Convert reglan and antinausea medications to PO. Flecainide/xarelto for atrial fibrillation per cards Hopefully home tomorrow.      LOS: 11 days    [name] 09/25/2014

## 2014-09-25 NOTE — Progress Notes (Signed)
PARENTERAL NUTRITION CONSULT NOTE - FOLLOW UP  Pharmacy Consult for TPN Indication: Gastric emptying issues/ileus with poor PO intake s/p Whipple Procedure  Allergies  Allergen Reactions  . Ace Inhibitors     Cough  . Codeine Other (See Comments)    Stomach pain  . Morphine And Related Other (See Comments)    Severe headache. Pt has not had since 90s and is ok with taking morphine.  . Sulfa Antibiotics Hives    Patient Measurements: Height: 5\' 3"  (160 cm) Weight: 174 lb 3.2 oz (79.017 kg) IBW/kg (Calculated) : 52.4 Adjusted Body Weight:  Usual Weight:   Vital Signs: Temp: 98.9 F (37.2 C) (01/03 0619) Temp Source: Oral (01/03 0619) BP: 131/83 mmHg (01/03 0619) Pulse Rate: 79 (01/03 0619) Intake/Output from previous day: 01/02 0701 - 01/03 0700 In: 940 [P.O.:940] Out: -  Intake/Output from this shift:    Labs:  Recent Labs  09/23/14 0500  WBC 4.0  HGB 8.8*  HCT 27.0*  PLT 202     Recent Labs  09/25/14 0420  NA 132*  K 4.0  CL 101  CO2 25  GLUCOSE 143*  BUN 15  CREATININE 0.53  CALCIUM 8.1*  PROT 5.2*  ALBUMIN 2.3*  AST 53*  ALT 76*  ALKPHOS 110  BILITOT 0.3   Estimated Creatinine Clearance: 67.9 mL/min (by C-G formula based on Cr of 0.53).    Recent Labs  09/24/14 1824 09/24/14 2347 09/25/14 0611  GLUCAP 122* 137* 137*    Medications:  Scheduled:  . dextromethorphan-guaiFENesin  1 tablet Oral BID  . feeding supplement (RESOURCE BREEZE)  1 Container Oral TID BM  . flecainide  100 mg Oral Q12H  . insulin aspart  0-9 Units Subcutaneous 4 times per day  . metoCLOPramide (REGLAN) injection  5 mg Intravenous 3 times per day  . pantoprazole (PROTONIX) IV  40 mg Intravenous QHS  . Rivaroxaban  15 mg Oral BID WC  . [START ON 10/15/2014] rivaroxaban  20 mg Oral Q supper    Insulin Requirements in the past 24 hours:  5 units Sens SSI, 12 units insulin in TPN  Current Nutrition:  Clinimix E 5/15 at 79ml/hr and 20% lipids at 10 ml/hr  provides 1894 kcal and 100 gm protein Full liquids  Nutritional Goals: (per RD noted 12/24) Kcal: 1900-2100 Protein: 105-115 grams   Assessment: Admitted with N/V; s/p Whipple procedure for pancreatic cancer 12/8.  Tolerating full liquids   GI: Hx GERD, diverticulosis, chronic gastritis, ischemic colitis. Abd soft, NT/ND.  NG clamped, no vomiting/LBM 1/1  CT (-)obstruction. PPI IV, Metoclopramide. Full liquid  Endo: (-)hx DM. CBGs 131-157, mostly controlled.  SSI q8  Lytes: Na 132, K 4  Renal: Cr has been stable, I/O not charted appropriately.  Pulm: RA.   Cards: Hx HTN, Afib. VSS. Resumed flecainide and start xarelto  AC: (+)PE, (-)DVT. Xarelto resumed  Hepatobil: AST & ALT mildly increased. TG 79.  Neuro: Anxiety improved on Ativan  ID: Afeb, WBC wnl, on no antibiotics  Best Practices: xarelto TPN Access: PICC 12/24 TPN day#: 12/24 >>  Plan: - Continue Clinimix E 5/15 at 3ml/hr and lipids at 54ml/hr - Cont insulin 12 units/bag (pt will receive ~11 units daily)   Thanks for allowing pharmacy to be a part of this patient's care.  Excell Seltzer, PharmD Clinical Pharmacist, [phone]

## 2014-09-25 NOTE — Progress Notes (Signed)
Patient ID: [name], female   DOB: March 17, 1946, 69 y.o.   MRN: [id number]  Patient Name: [name] Date of Encounter: 09/25/2014  Active Problems:   PAF-NSR on Flec prior to adm   HTN (hypertension)   Obesity (BMI 30-39.9)   Cancer of head of pancreas-S/P Whipple 08/30/14   Nausea and vomiting   Protein-calorie malnutrition, severe   Pulmonary embolism   Hyperglycemia   Anemia   Length of Stay: 11  SUBJECTIVE  Continues to tolerate soft solids    CURRENT MEDS . dextromethorphan-guaiFENesin  1 tablet Oral BID  . feeding supplement (RESOURCE BREEZE)  1 Container Oral TID BM  . flecainide  100 mg Oral Q12H  . insulin aspart  0-9 Units Subcutaneous 4 times per day  . metoCLOPramide  10 mg Oral TID AC & HS  . pantoprazole  40 mg Oral Q1200  . Rivaroxaban  15 mg Oral BID WC  . [START ON 10/15/2014] rivaroxaban  20 mg Oral Q supper    OBJECTIVE   Intake/Output Summary (Last 24 hours) at 09/25/14 1201 Last data filed at 09/24/14 1300  Gross per 24 hour  Intake    240 ml  Output      0 ml  Net    240 ml   Filed Weights   09/14/14 1600 09/20/14 0700  Weight: 80.74 kg (178 lb) 79.017 kg (174 lb 3.2 oz)    PHYSICAL EXAM Filed Vitals:   09/24/14 0404 09/24/14 1312 09/24/14 2007 09/25/14 0619  BP: 112/62 118/70 125/73 131/83  Pulse: 64 71 68 79  Temp: 98.3 F (36.8 C) 98.5 F (36.9 C) 98.8 F (37.1 C) 98.9 F (37.2 C)  TempSrc: Oral Oral Oral Oral  Resp: 18 16 16 18   Height:      Weight:      SpO2: 98% 100% 100% 97%   General: Alert, oriented x3, no distress Head: no evidence of trauma, PERRL, EOMI, no exophtalmos or lid lag, no myxedema, no xanthelasma; normal ears, nose and oropharynx Neck: normal jugular venous pulsations and no hepatojugular reflux; brisk carotid pulses without delay and no carotid bruits Chest: clear to auscultation, no signs of consolidation by percussion or palpation, normal fremitus, symmetrical and full respiratory  excursions Cardiovascular: normal position and quality of the apical impulse, regular rhythm, normal first and second heart sounds, no rubs or gallops, no murmur Abdomen: no tenderness or distention, no masses by palpation, no abnormal pulsatility or arterial bruits, normal bowel sounds, no hepatosplenomegaly Extremities: no clubbing, cyanosis or edema; 2+ radial, ulnar and brachial pulses bilaterally; 2+ right femoral, posterior tibial and dorsalis pedis pulses; 2+ left femoral, posterior tibial and dorsalis pedis pulses; no subclavian or femoral bruits Neurological: grossly nonfocal  LABS  CBC  Recent Labs  09/23/14 0500  WBC 4.0  HGB 8.8*  HCT 27.0*  MCV 88.2  PLT 338   Basic Metabolic Panel  Recent Labs  09/25/14 0420  NA 132*  K 4.0  CL 101  CO2 25  GLUCOSE 143*  BUN 15  CREATININE 0.53  CALCIUM 8.1*   Liver Function Tests  Recent Labs  09/25/14 0420  AST 53*  ALT 76*  ALKPHOS 110  BILITOT 0.3  PROT 5.2*  ALBUMIN 2.3*   Radiology Studies Imaging results have been reviewed and No results found.  TELE NSR  ASSESSMENT AND PLAN  Resumed flecainide at home dose yesterday  Resumed xarelto yesterday. On it for afib but wrote for PE dosing 15 bid  for recent PE  Follow Hct 27 on 1/1   [employer]

## 2014-09-26 ENCOUNTER — Other Ambulatory Visit: Payer: Self-pay | Admitting: *Deleted

## 2014-09-26 LAB — DIFFERENTIAL
Basophils Absolute: 0 10*3/uL (ref 0.0–0.1)
Basophils Relative: 0 % (ref 0–1)
Eosinophils Absolute: 0.7 10*3/uL (ref 0.0–0.7)
Eosinophils Relative: 17 % — ABNORMAL HIGH (ref 0–5)
LYMPHS ABS: 1.3 10*3/uL (ref 0.7–4.0)
LYMPHS PCT: 30 % (ref 12–46)
MONO ABS: 0.6 10*3/uL (ref 0.1–1.0)
Monocytes Relative: 13 % — ABNORMAL HIGH (ref 3–12)
Neutro Abs: 1.6 10*3/uL — ABNORMAL LOW (ref 1.7–7.7)
Neutrophils Relative %: 40 % — ABNORMAL LOW (ref 43–77)

## 2014-09-26 LAB — COMPREHENSIVE METABOLIC PANEL
ALBUMIN: 2.6 g/dL — AB (ref 3.5–5.2)
ALT: 81 U/L — ABNORMAL HIGH (ref 0–35)
AST: 60 U/L — AB (ref 0–37)
Alkaline Phosphatase: 127 U/L — ABNORMAL HIGH (ref 39–117)
Anion gap: 6 (ref 5–15)
BUN: 9 mg/dL (ref 6–23)
CALCIUM: 8.4 mg/dL (ref 8.4–10.5)
CO2: 26 mmol/L (ref 19–32)
CREATININE: 0.61 mg/dL (ref 0.50–1.10)
Chloride: 101 mEq/L (ref 96–112)
GFR calc Af Amer: >90 mL/min (ref >90)
Glucose, Bld: 98 mg/dL (ref 70–99)
Potassium: 3.8 mmol/L (ref 3.5–5.1)
SODIUM: 133 mmol/L — AB (ref 135–145)
TOTAL PROTEIN: 5.7 g/dL — AB (ref 6.0–8.3)
Total Bilirubin: 0.5 mg/dL (ref 0.3–1.2)

## 2014-09-26 LAB — CBC
HCT: 26.7 % — ABNORMAL LOW (ref 36.0–46.0)
Hemoglobin: 8.8 g/dL — ABNORMAL LOW (ref 12.0–15.0)
MCH: 28.5 pg (ref 26.0–34.0)
MCHC: 33 g/dL (ref 30.0–36.0)
MCV: 86.4 fL (ref 78.0–100.0)
PLATELETS: 228 10*3/uL (ref 150–400)
RBC: 3.09 MIL/uL — AB (ref 3.87–5.11)
RDW: 14.1 % (ref 11.5–15.5)
WBC: 4.1 10*3/uL (ref 4.0–10.5)

## 2014-09-26 LAB — GLUCOSE, CAPILLARY: Glucose-Capillary: 122 mg/dL — ABNORMAL HIGH (ref 70–99)

## 2014-09-26 MED ORDER — LORAZEPAM 1 MG PO TABS: 1.0000 mg | ORAL_TABLET | Freq: Four times a day (QID) | ORAL | Status: DC | PRN

## 2014-09-26 MED ORDER — RIVAROXABAN 15 MG PO TABS: 15.0000 mg | ORAL_TABLET | Freq: Two times a day (BID) | ORAL | Status: DC

## 2014-09-26 MED ORDER — RIVAROXABAN 20 MG PO TABS: 20.0000 mg | ORAL_TABLET | Freq: Every day | ORAL | Status: DC

## 2014-09-26 NOTE — Progress Notes (Signed)
Medicare Important Message given? YES  (If response is "NO", the following Medicare IM given date fields will be blank)  Date Medicare IM given: 09/26/14 Medicare IM given by:  Erie Sica  

## 2014-09-26 NOTE — Progress Notes (Signed)
Pt up ambulating in hallway with family; will cont. To monitor.

## 2014-09-26 NOTE — Discharge Summary (Signed)
Physician Discharge Summary  Patient ID: CAMERYN SCHUM MRN: [id number] DOB/AGE: Nov 08, 1945 69 y.o.  Admit date: 09/14/2014 Discharge date: 09/26/2014  Admission Diagnoses: Delayed gastric emptying.   Pancreatic cancer Protein calorie malnutrition PE  Discharge Diagnoses:  Active Problems:   PAF-NSR on Flec prior to adm   HTN (hypertension)   Obesity (BMI 30-39.9)   Cancer of head of pancreas-S/P Whipple 08/30/14   Nausea and vomiting   Protein-calorie malnutrition, severe   Pulmonary embolism   Hyperglycemia   Anemia   Discharged Condition: stable  Hospital Course: Pt was admitted with a PE and intractable nausea and vomiting.  She was placed on a heparin gtt.  She was switched back to IV amiodarone from PO flecainide.  She was placed on TNA.  She had an NGT placed.  After several days when output decreased, the NGT was pulled.  She was passing gas and having BMs.  She was slowly advanced from clear liquids to full liquids, but she had n/v with some ice cream.  She was placed back on clear liquids for several days.  She was placed on reglan IV and nausea subsided.  She was then able to be advanced to fulls, then soft solids.  She was transitioned to PO reglan and PO nausea medications.  She was able to tolerate this well.  She was also switched back to oral flecainide and xarelto for her atrial fibrillation and PE.  She is discharged to home in stable condition.    Consults: cardiology  Significant Diagnostic Studies: labs: see epic  Treatments: IV hydration and TPN  Discharge Exam: Blood pressure 136/72, pulse 80, temperature 98.2 F (36.8 C), temperature source Oral, resp. rate 18, height 5\' 3"  (1.6 m), weight 174 lb 3.2 oz (79.017 kg), SpO2 97 %. General appearance: alert, cooperative and no distress Resp: breathing comfortably GI soft, non tender, non distended.  Small area of discharge on abdomen.    Disposition: 01-Home or Self Care  Discharge Instructions    Call MD for:  difficulty breathing, headache or visual disturbances    Complete by:  As directed      Call MD for:  hives    Complete by:  As directed      Call MD for:  persistant dizziness or light-headedness    Complete by:  As directed      Call MD for:  persistant nausea and vomiting    Complete by:  As directed      Call MD for:  redness, tenderness, or signs of infection (pain, swelling, redness, odor or green/yellow discharge around incision site)    Complete by:  As directed      Call MD for:  severe uncontrolled pain    Complete by:  As directed      Call MD for:  temperature >100.4    Complete by:  As directed      Change dressing (specify)    Complete by:  As directed   Dry dressing as needed to abdominal wound.  OK to shower.     Diet - low sodium heart healthy    Complete by:  As directed      Increase activity slowly    Complete by:  As directed             Medication List    TAKE these medications        acetaminophen 500 MG tablet  Commonly known as:  TYLENOL  Take 1,000 mg by mouth  every 6 (six) hours as needed for pain.     CENTRUM SILVER PO  Take 1 tablet by mouth every morning.     cetirizine 10 MG tablet  Commonly known as:  ZYRTEC  Take 10 mg by mouth daily.     dextromethorphan-guaiFENesin 30-600 MG per 12 hr tablet  Commonly known as:  MUCINEX DM  Take 1 tablet by mouth 3 (three) times daily.     Eszopiclone 3 MG Tabs  Take 1.5 mg by mouth at bedtime.     feeding supplement (RESOURCE BREEZE) Liqd  Take 1 Container by mouth 3 (three) times daily between meals.     flecainide 100 MG tablet  Commonly known as:  TAMBOCOR  Take 100 mg by mouth 2 (two) times daily.     fluticasone 50 MCG/ACT nasal spray  Commonly known as:  FLONASE  Place 1 spray into both nostrils 2 (two) times daily.     GAS-X PO  Take 1 tablet by mouth 3 (three) times daily as needed (for gas).     LORazepam 1 MG tablet  Commonly known as:  ATIVAN  Take 1 tablet (1  mg total) by mouth every 6 (six) hours as needed for anxiety.     losartan 50 MG tablet  Commonly known as:  COZAAR  Take 50 mg by mouth every morning.     metoCLOPramide 10 MG tablet  Commonly known as:  REGLAN  Take 1 tablet (10 mg total) by mouth 4 (four) times daily -  before meals and at bedtime.     metoprolol succinate 25 MG 24 hr tablet  Commonly known as:  TOPROL-XL  Take 25 mg by mouth every morning.     omeprazole 20 MG capsule  Commonly known as:  PRILOSEC  Take 1 capsule (20 mg total) by mouth 2 (two) times daily before a meal.     ondansetron 4 MG tablet  Commonly known as:  ZOFRAN  Take 1 tablet (4 mg total) by mouth 2 (two) times daily.     ondansetron 4 MG tablet  Commonly known as:  ZOFRAN  Take 1 tablet (4 mg total) by mouth every 6 (six) hours as needed for nausea or vomiting.     oxyCODONE-acetaminophen 5-325 MG per tablet  Commonly known as:  PERCOCET/ROXICET  Take 1-2 tablets by mouth every 4 (four) hours as needed for moderate pain.     promethazine 12.5 MG tablet  Commonly known as:  PHENERGAN  Take 1-2 tablets (12.5-25 mg total) by mouth every 6 (six) hours as needed for nausea, vomiting or refractory nausea / vomiting.     rivaroxaban 20 MG Tabs tablet  Commonly known as:  XARELTO  Take 20 mg by mouth at bedtime.     vitamin C 500 MG tablet  Commonly known as:  ASCORBIC ACID  Take 500 mg by mouth every morning.     zolpidem 5 MG tablet  Commonly known as:  AMBIEN  Take 1 tablet (5 mg total) by mouth at bedtime as needed for sleep.           Follow-up Information    Follow up with [facility], MD In 2 weeks.   Specialty:  General Surgery   Contact information:   [address] [place] [address] [phone]       Follow up with Betsy Coder, MD In 2 weeks.   Specialty:  Oncology   Contact information:   [address] [place] [address] [phone]       Follow up with [name], MD.    Specialty:  Cardiology   Why:  The office will call you to make an appoinment., If you do not hear from them, please contact them., You should be seen within 1-2 weeks.   Contact information:   [address] [place] [address] [place] [address] [phone]       Signed: [name] 09/26/2014, 10:08 AM

## 2014-09-26 NOTE — Progress Notes (Signed)
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 Ready to go home. Says she is leaving today. No CP or SOB.   CURRENT MEDS . dextromethorphan-guaiFENesin  1 tablet Oral BID  . feeding supplement (RESOURCE BREEZE)  1 Container Oral TID BM  . flecainide  100 mg Oral Q12H  . insulin aspart  0-9 Units Subcutaneous 4 times per day  . loratadine  10 mg Oral Daily  . metoCLOPramide  10 mg Oral TID AC & HS  . pantoprazole  40 mg Oral Q1200  . Rivaroxaban  15 mg Oral BID WC  . [START ON 10/15/2014] rivaroxaban  20 mg Oral Q supper    OBJECTIVE  Filed Vitals:   09/24/14 2007 09/25/14 0619 09/25/14 2045 09/26/14 0421  BP: 125/73 131/83 134/83 136/72  Pulse: 68 79 84 80  Temp: 98.8 F (37.1 C) 98.9 F (37.2 C) 98.7 F (37.1 C) 98.2 F (36.8 C)  TempSrc: Oral Oral Oral Oral  Resp: 16 18 18 18   Height:      Weight:      SpO2: 100% 97% 98% 97%   No intake or output data in the 24 hours ending 09/26/14 0636 Filed Weights   09/14/14 1600 09/20/14 0700  Weight: 178 lb (80.74 kg) 174 lb 3.2 oz (79.017 kg)    PHYSICAL EXAM  General: Pleasant, NAD. Neuro: Alert and oriented X 3. Moves all extremities spontaneously. Psych: Normal affect. HEENT:  Normal  Neck: Supple without bruits or JVD. Lungs:  Resp regular and unlabored, CTA. Heart: RRR no s3, s4, or murmurs. Abdomen: Soft, non-tender, non-distended, BS + x 4.  Extremities: No clubbing, cyanosis or edema. DP/PT/Radials 2+ and equal bilaterally.  Accessory Clinical Findings  CBC  Recent Labs  09/26/14 0414  WBC 4.1  NEUTROABS 1.6*  HGB 8.8*  HCT 26.7*  MCV 86.4  PLT 706   Basic Metabolic  Panel  Recent Labs  09/25/14 0420 09/26/14 0414  NA 132* 133*  K 4.0 3.8  CL 101 101  CO2 25 26  GLUCOSE 143* 98  BUN 15 9  CREATININE 0.53 0.61  CALCIUM 8.1* 8.4   Liver Function Tests  Recent Labs  09/25/14 0420 09/26/14 0414  AST 53* 60*  ALT 76* 81*  ALKPHOS 110 127*  BILITOT 0.3 0.5  PROT 5.2* 5.7*  ALBUMIN 2.3* 2.6*    TELE  NSR  Radiology/Studies  Ct Abdomen Pelvis W Contrast  09/16/2014   CLINICAL DATA:  Abdominal pain  EXAM: CT ABDOMEN AND PELVIS WITH CONTRAST  TECHNIQUE: Multidetector CT imaging of the abdomen and pelvis was performed using the standard protocol following bolus administration of intravenous contrast.  CONTRAST:  128mL OMNIPAQUE IOHEXOL 300 MG/ML  SOLN  COMPARISON:  07/14/2014  FINDINGS: There is pulmonary thromboembolism in the right lower lobe pulmonary artery. Tiny right pleural effusion. Tiny left pleural effusion. Mild dependent atelectasis at the lung bases.  Diffuse hepatic steatosis.  NG tube tip in the body of the stomach.  Pancreatic head is been resected. Rule on wide loop has been placed. There is wall thickening of the Roux-en-Y loop extending into  the hilum of the liver.  There is no extraluminal bowel gas.  There is no abscess cavity.  Small amount of free fluid in the pelvis and adjacent to the liver. Trace free fluid in the left pericolic gutter.  Kidneys, adrenal glands, spleen are within normal limits.  Postop changes in the anterior abdominal wall  Stable lower thoracic spine.  No new compression deformity.  IMPRESSION: Postoperative changes as described from pancreatic head resection. There is wall thickening of the row 1 wide loop extending to the hilum of the liver. Inflammatory process is not excluded.  There is acute pulmonary thromboembolism in the right lower lobe.  Critical Value/emergent results were called by telephone at the time of interpretation on 09/16/2014 at 8:10 pm to [facility], [place] who verbally acknowledged these  results.   Electronically Signed   By: Maryclare Bean M.D.   On: 09/16/2014 20:11   Dg Abd Portable 1v  09/02/2014   CLINICAL DATA:  Progressive abdominal pain. Whipple procedure 3 days ago.  EXAM: PORTABLE ABDOMEN - 1 VIEW  COMPARISON:  CT of the abdomen pelvis 07/14/2014.  FINDINGS: Surgical drains are in place. The transverse colon somewhat low lying. There is relatively little gas over the upper abdomen. This may represent fluid or inflammation in the upper abdomen. Laparotomy is noted.  IMPRESSION: Low positioning of the transverse colon may represent mass effect following surgery. Fluid or inflammation is not excluded. CT of the abdomen pelvis may be useful for further evaluation if clinically indicated.   Electronically Signed   By: Lawrence Santiago M.D.   On: 09/02/2014 13:55   Dg Abd Portable 2v  09/15/2014   CLINICAL DATA:  Vomiting, nasogastric tube placement  EXAM: PORTABLE ABDOMEN - 2 VIEW  COMPARISON:  09/02/2014  FINDINGS: Nasogastric tube terminates over the gastric body expected location. Suture line is present over the left mid abdomen. No dilated loop of large or small gas-filled bowel. Clips are noted over the mid abdomen. No acute osseous finding. No free air identified.  IMPRESSION: Nasogastric tube tip terminates over the expected location of the body of the stomach.   Electronically Signed   By: Conchita Paris M.D.   On: 09/15/2014 01:37    ASSESSMENT AND PLAN  Ms. Romo is a 69 y/o F with history of PAF (first noted 09/2011), OA, endometrial CA 2012 s/p hysterectomy, HTN, normal stress test 10/2011, and pancreatic adenocarcinoma who [facility] to [facility]14[facility]2014 with readmission following recent Whipple on 12/8. Cardiology consulted to help guide antiarrythmic therapy while NPO.   Pancreatic CA s/p Whipple and pancreatic stent 08/30/14, readmitted with gastroparesis   -- tolerating soft diet  Acute PE- -- On Xarelto 15mg  BID for 21 days (started 09/24/13). She will  then start xarelto 20mg  qd when acute PE regimen is finished.   H/o PAF, currently maintaining NSR -- Restarted on home dose of flecainide 09/25/13. -- Cont Xarelto ( PE dosing for now )  Obesity Body mass index is 31.54 kg/(m^2).  HTN, controlled at present time  Anemia- H/H stable at 8.8/26.7  Dispo- will need cardiology follow up in the next couple weeks. Will have this arranged. The office will call her to make an appointment during normal business hours.   SignedEileen Stanford PA-C  Pager [phone] As above, patient seen and examined. She denies chest pain or dyspnea. She remains in sinus rhythm. Would continue preadmission dose of flecainide. Continue xarelto at present dose for 3 weeks (s/p pulmonary embolus);  resume 20 mg daily afterwards. Resume preadmission dose of toprol at DC. FU Dr Aundra Dubin following DC. Kirk Ruths

## 2014-09-26 NOTE — Progress Notes (Signed)
Message from Dr. [name] to move her 09/30/14 appointment to 1/13 or 1/20. POF to scheduler.

## 2014-09-27 ENCOUNTER — Telehealth: Payer: Self-pay | Admitting: Oncology

## 2014-09-27 ENCOUNTER — Other Ambulatory Visit: Payer: Self-pay | Admitting: *Deleted

## 2014-09-27 LAB — GLUCOSE, CAPILLARY: Glucose-Capillary: 90 mg/dL (ref 70–99)

## 2014-09-27 NOTE — Telephone Encounter (Signed)
S/w pt confirming MD visit r/s from 01/08 due to just released from the hospital and is set to see Dr. Barry Dienes on 01/19.Marland Kitchen... KJ

## 2014-09-27 NOTE — H&P (Signed)
[name]. [name] 09/14/2014 2:54 PM Location: [facility] Patient #: [id number] DOB: 1946/08/11 Married / Language: English / Race: White Female  History of Present Illness (Schuyler Olden A. Ninfa Linden MD; 09/14/2014 3:30 PM) Patient words: not able to keep anything down.  The patient is a 69 year old female presenting for a post-operative visit. This is a patient of Dr. Marlowe Aschoff that is status post a Whipple. She was just discharged this weekend. Last night she started having severe nausea and vomiting. This has persisted throughout the day. She has had 3 bowel movements and is passing flatus. She does have abdominal pain   Vitals Briant Cedar CMA; 09/14/2014 2:57 PM) 09/14/2014 2:55 PM Weight: 178.38 lb Height: 63in Body Surface Area: 1.9 m Body Mass Index: 31.6 kg/m Temp.: 97.57F  Pulse: 73 (Regular)  BP: 116/60 (Sitting, Left Arm, Standard)    Physical Exam (Aquarius Tremper A. Ninfa Linden MD; 09/14/2014 3:31 PM) The physical exam findings are as follows: Note:She is ill in appearance. Her abdomen is distended with minimal tenderness Lungs CTA bilaterally CV RRR Ext with no edema Awake alert and oriented    Assessment & Plan (Joseline Mccampbell A. Ninfa Linden MD; 09/14/2014 3:31 PM) POSTOP CHECK (V67.00  Z09) Ileus  Impression: Because of her persistent nausea and vomiting, I am going to admit her to the hospital for further workup. She and her family are in agreement.     Signed by Harl Bowie, MD (

## 2014-09-30 ENCOUNTER — Ambulatory Visit: Payer: Medicare Other | Admitting: Oncology

## 2014-10-04 ENCOUNTER — Emergency Department (HOSPITAL_COMMUNITY): Payer: Medicare Other

## 2014-10-04 ENCOUNTER — Encounter (HOSPITAL_COMMUNITY): Payer: Self-pay | Admitting: Emergency Medicine

## 2014-10-04 ENCOUNTER — Emergency Department (HOSPITAL_COMMUNITY)
Admission: EM | Admit: 2014-10-04 | Discharge: 2014-10-04 | Disposition: A | Discharge status: Home / Self Care | Payer: Medicare Other | Attending: Emergency Medicine | Admitting: Emergency Medicine

## 2014-10-04 DIAGNOSIS — Z86711 Personal history of pulmonary embolism: Secondary | ICD-10-CM | POA: Insufficient documentation

## 2014-10-04 DIAGNOSIS — Z8507 Personal history of malignant neoplasm of pancreas: Secondary | ICD-10-CM | POA: Diagnosis not present

## 2014-10-04 DIAGNOSIS — Z7951 Long term (current) use of inhaled steroids: Secondary | ICD-10-CM | POA: Diagnosis not present

## 2014-10-04 DIAGNOSIS — M1990 Unspecified osteoarthritis, unspecified site: Secondary | ICD-10-CM | POA: Insufficient documentation

## 2014-10-04 DIAGNOSIS — Z9049 Acquired absence of other specified parts of digestive tract: Secondary | ICD-10-CM | POA: Diagnosis not present

## 2014-10-04 DIAGNOSIS — K219 Gastro-esophageal reflux disease without esophagitis: Secondary | ICD-10-CM | POA: Insufficient documentation

## 2014-10-04 DIAGNOSIS — Z9851 Tubal ligation status: Secondary | ICD-10-CM | POA: Insufficient documentation

## 2014-10-04 DIAGNOSIS — Z86018 Personal history of other benign neoplasm: Secondary | ICD-10-CM | POA: Diagnosis not present

## 2014-10-04 DIAGNOSIS — E669 Obesity, unspecified: Secondary | ICD-10-CM | POA: Insufficient documentation

## 2014-10-04 DIAGNOSIS — Z90710 Acquired absence of both cervix and uterus: Secondary | ICD-10-CM | POA: Diagnosis not present

## 2014-10-04 DIAGNOSIS — Z8589 Personal history of malignant neoplasm of other organs and systems: Secondary | ICD-10-CM | POA: Diagnosis not present

## 2014-10-04 DIAGNOSIS — R109 Unspecified abdominal pain: Secondary | ICD-10-CM | POA: Diagnosis not present

## 2014-10-04 DIAGNOSIS — R110 Nausea: Secondary | ICD-10-CM | POA: Insufficient documentation

## 2014-10-04 DIAGNOSIS — R1110 Vomiting, unspecified: Secondary | ICD-10-CM | POA: Diagnosis present

## 2014-10-04 DIAGNOSIS — Z8619 Personal history of other infectious and parasitic diseases: Secondary | ICD-10-CM | POA: Insufficient documentation

## 2014-10-04 DIAGNOSIS — Z8701 Personal history of pneumonia (recurrent): Secondary | ICD-10-CM | POA: Diagnosis not present

## 2014-10-04 DIAGNOSIS — Z79899 Other long term (current) drug therapy: Secondary | ICD-10-CM | POA: Diagnosis not present

## 2014-10-04 DIAGNOSIS — Z7901 Long term (current) use of anticoagulants: Secondary | ICD-10-CM | POA: Insufficient documentation

## 2014-10-04 DIAGNOSIS — I10 Essential (primary) hypertension: Secondary | ICD-10-CM | POA: Diagnosis not present

## 2014-10-04 DIAGNOSIS — Z9889 Other specified postprocedural states: Secondary | ICD-10-CM | POA: Diagnosis not present

## 2014-10-04 LAB — COMPREHENSIVE METABOLIC PANEL
ALK PHOS: 106 U/L (ref 39–117)
ALT: 22 U/L (ref 0–35)
AST: 20 U/L (ref 0–37)
Albumin: 3.2 g/dL — ABNORMAL LOW (ref 3.5–5.2)
Anion gap: 6 (ref 5–15)
BUN: 11 mg/dL (ref 6–23)
CO2: 27 mmol/L (ref 19–32)
Calcium: 8.5 mg/dL (ref 8.4–10.5)
Chloride: 99 mEq/L (ref 96–112)
Creatinine, Ser: 0.68 mg/dL (ref 0.50–1.10)
GFR calc Af Amer: >90 mL/min (ref >90)
GFR, EST NON AFRICAN AMERICAN: 89 mL/min — AB (ref >90)
GLUCOSE: 120 mg/dL — AB (ref 70–99)
POTASSIUM: 4.2 mmol/L (ref 3.5–5.1)
Sodium: 132 mmol/L — ABNORMAL LOW (ref 135–145)
TOTAL PROTEIN: 6.7 g/dL (ref 6.0–8.3)
Total Bilirubin: 0.4 mg/dL (ref 0.3–1.2)

## 2014-10-04 LAB — URINALYSIS, ROUTINE W REFLEX MICROSCOPIC
Bilirubin Urine: NEGATIVE
Glucose, UA: NEGATIVE mg/dL
Hgb urine dipstick: NEGATIVE
Ketones, ur: NEGATIVE mg/dL
Nitrite: NEGATIVE
PH: 6.5 (ref 5.0–8.0)
Protein, ur: NEGATIVE mg/dL
Specific Gravity, Urine: 1.014 (ref 1.005–1.030)
Urobilinogen, UA: 0.2 mg/dL (ref 0.0–1.0)

## 2014-10-04 LAB — CBC WITH DIFFERENTIAL/PLATELET
BASOS ABS: 0 10*3/uL (ref 0.0–0.1)
BASOS PCT: 0 % (ref 0–1)
EOS PCT: 4 % (ref 0–5)
Eosinophils Absolute: 0.2 10*3/uL (ref 0.0–0.7)
HCT: 30.2 % — ABNORMAL LOW (ref 36.0–46.0)
HEMOGLOBIN: 9.7 g/dL — AB (ref 12.0–15.0)
Lymphocytes Relative: 27 % (ref 12–46)
Lymphs Abs: 1.3 10*3/uL (ref 0.7–4.0)
MCH: 28.4 pg (ref 26.0–34.0)
MCHC: 32.1 g/dL (ref 30.0–36.0)
MCV: 88.3 fL (ref 78.0–100.0)
Monocytes Absolute: 0.5 10*3/uL (ref 0.1–1.0)
Monocytes Relative: 10 % (ref 3–12)
Neutro Abs: 2.9 10*3/uL (ref 1.7–7.7)
Neutrophils Relative %: 59 % (ref 43–77)
PLATELETS: 455 10*3/uL — AB (ref 150–400)
RBC: 3.42 MIL/uL — ABNORMAL LOW (ref 3.87–5.11)
RDW: 13.9 % (ref 11.5–15.5)
WBC: 4.9 10*3/uL (ref 4.0–10.5)

## 2014-10-04 LAB — URINE MICROSCOPIC-ADD ON

## 2014-10-04 LAB — LIPASE, BLOOD

## 2014-10-04 MED ORDER — SCOPOLAMINE 1 MG/3DAYS TD PT72: 1.0000 | MEDICATED_PATCH | TRANSDERMAL | Status: DC

## 2014-10-04 MED ORDER — SODIUM CHLORIDE 0.9 % IV BOLUS (SEPSIS)
500.0000 mL | Freq: Once | INTRAVENOUS | Status: AC
  Administered 2014-10-04: 500 mL via INTRAVENOUS

## 2014-10-04 MED ORDER — ONDANSETRON HCL 4 MG/2ML IJ SOLN
4.0000 mg | Freq: Once | INTRAMUSCULAR | Status: AC
  Administered 2014-10-04: 4 mg via INTRAVENOUS
  Filled 2014-10-04: qty 2

## 2014-10-04 MED ORDER — SCOPOLAMINE 1 MG/3DAYS TD PT72
1.0000 | MEDICATED_PATCH | TRANSDERMAL | Status: DC
  Administered 2014-10-04: 1.5 mg via TRANSDERMAL
  Filled 2014-10-04: qty 1

## 2014-10-04 NOTE — ED Notes (Signed)
Pt c/o vomiting since Sunday and abd discomfort. Has had 2 vomit episodes today. Pt has constant nausea. Had recent whipple surgery on Dec. 8, 2015.
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
## 2014-10-04 NOTE — Discharge Instructions (Signed)

## 2014-10-04 NOTE — ED Notes (Signed)
Attempted IV once, unsuccessful. Lab drew blood, will get another nurse to attempt ultrasound IV

## 2014-10-04 NOTE — ED Notes (Signed)
Patient transported to X-ray 

## 2014-10-04 NOTE — ED Notes (Signed)
Ambulated pt to bathroom, brought pt a warm blanket, pt stated no other needs at this time.

## 2014-10-05 ENCOUNTER — Ambulatory Visit (HOSPITAL_BASED_OUTPATIENT_CLINIC_OR_DEPARTMENT_OTHER): Payer: Medicare Other | Admitting: Oncology

## 2014-10-05 ENCOUNTER — Other Ambulatory Visit: Payer: Medicare Other

## 2014-10-05 ENCOUNTER — Telehealth: Payer: Self-pay | Admitting: Oncology

## 2014-10-05 ENCOUNTER — Encounter: Payer: Self-pay | Admitting: *Deleted

## 2014-10-05 ENCOUNTER — Other Ambulatory Visit: Payer: Self-pay | Admitting: *Deleted

## 2014-10-05 VITALS — BP 114/66 | HR 60 | Temp 97.7°F | Resp 18 | Ht 63.0 in | Wt 169.1 lb

## 2014-10-05 DIAGNOSIS — I2699 Other pulmonary embolism without acute cor pulmonale: Secondary | ICD-10-CM

## 2014-10-05 DIAGNOSIS — C250 Malignant neoplasm of head of pancreas: Secondary | ICD-10-CM

## 2014-10-05 MED ORDER — LIDOCAINE-PRILOCAINE 2.5-2.5 % EX CREA: TOPICAL_CREAM | CUTANEOUS | Status: DC

## 2014-10-05 NOTE — Telephone Encounter (Signed)
Gave avs & cal for Jan. Sent mess to sch tx. °

## 2014-10-05 NOTE — Progress Notes (Signed)
[facility] OFFICE PROGRESS NOTE   Diagnosis: Pancreas cancer  INTERVAL HISTORY:   She underwent a Whipple procedure 08/30/2014. She was discharged 09/10/2014. [name] was readmitted on 09/14/2014 with intractable nausea and vomiting and a pulmonary embolism. An NG tube was placed and she improved over several days. She was discharged to home 09/26/2014. She is maintained on xarelto anticoagulation. She takes Reglan for nausea. [name] presented to the emergency room yesterday with recurrent nausea and vomiting. A scopolamine patch was placed. She feels better today.  [name] complains of anxiety that has not been relieved with lorazepam.  The pathology from the Whipple procedure confirmed a 2.8 cm moderately differentiated adenocarcinoma with invasion of the duodenum. The resection margins and 8 lymph nodes were negative. No lymphovascular invasion. Perineural invasion was present. The retroperitoneal margin was 1 mm.  Objective:  Vital signs in last 24 hours:  Blood pressure 114/66, pulse 60, temperature 97.7 F (36.5 C), temperature source Oral, resp. rate 18, height 5\' 3"  (1.6 m), weight 169 lb 1.6 oz (76.703 kg), SpO2 99 %.   Resp: Lungs clear bilaterally Cardio: Regular rate and rhythm GI: No hepatomegaly, and the midline incision has almost completely healed with a superficial opening of the upper portion of the incision Vascular: No leg edema    Portacath/PICC-without erythema  Lab Results:  Lab Results  Component Value Date   WBC 4.9 10/04/2014   HGB 9.7* 10/04/2014   HCT 30.2* 10/04/2014   MCV 88.3 10/04/2014   PLT 455* 10/04/2014   NEUTROABS 2.9 10/04/2014     Imaging:  Dg Abd 2 Views  10/04/2014   CLINICAL DATA:  Vomiting since Sunday, abdominal discomfort  EXAM: ABDOMEN - 2 VIEW  COMPARISON:  None.  FINDINGS: There is a relative paucity of bowel gas which may reflect decompressed bowel versus fluid-filled bowel as can be [name] in the  setting of obstruction. There is no evidence of free air. No radio-opaque calculi or other significant radiographic abnormality is [name].  IMPRESSION: 1. There is a relative paucity of bowel gas which may reflect decompressed bowel versus fluid-filled bowel as can be [name] in the setting of obstruction.   Electronically Signed   By: Kathreen Devoid   On: 10/04/2014 16:53    Medications: I have reviewed the patient's current medications.  Assessment/Plan: 1. 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 Admission with post ERCP pancreatitis 06/16/2014  4. History of Abdominal pain secondary to #1  5. Pulmonary embolism diagnosed on a CT of the abdomen 09/16/2014  Negative lower extremity Dopplers 09/17/2014  6. Multiple orthopedic surgical procedures  7. Endometrial cancer,stage IA, grade 1 endometrioid adenocarcinoma, 18% myometrial invasion, no lymphovascular space involvement, negative washings  Status post robotic total hysterectomy and bilateral salpingo-oophorectomy 11/30/2010  8. History of atrial fibrillation-maintained on xarelto  9. Family history of multiple cancers-negative CancerNext gene panel  10. Prolonged nausea following the pancreaticoduodenectomy     Disposition:  [name] continues to recover from the pancreaticoduodenectomy procedure. I reviewed the pathology report with [name] and her family. There is a significant chance of developing recurrent pancreas cancer over the next several years.  I recommend adjuvant gemcitabine chemotherapy. I will present her case at the  GI tumor conference to discuss the indication for adjuvant radiation.  We reviewed the potential toxicities associated with gemcitabine including the chance for nausea, alopecia, hematologic toxicity, and allergic reaction, fever, and pneumonitis. She agrees to proceed. She will obtain a chemotherapy teaching class today.  Ms. [name] will be referred to Dr. Barry Dienes for placement of a Port-A-Cath. A first treatment with gemcitabine will be scheduled for 10/19/2014.  She has significant anxiety. This could be related in part to polypharmacy. She will hold Reglan for the next few days to see if this helps. She will continue Ativan as needed for anxiety.  Betsy Coder, MD  10/05/2014  11:30 AM

## 2014-10-06 ENCOUNTER — Telehealth: Payer: Self-pay | Admitting: *Deleted

## 2014-10-06 NOTE — Telephone Encounter (Signed)
Received message from pt: "went to urgent care; no breaks; just badly bruised"  Dr. [name] made aware.

## 2014-10-06 NOTE — Telephone Encounter (Signed)
Received message from pt that she fell at home after leaving here yesterday.  Called and spoke with pt; she reports that she fell in yard; "hurt my left side/rib; slept with heating pad last night; what does Dr. [name] want me to do?"  Per Dr. [name]; instructed pt to go to urgent care or ED for evaluation; call office with report.  Pt verbalized understanding of instructions.

## 2014-10-07 ENCOUNTER — Telehealth: Payer: Self-pay | Admitting: *Deleted

## 2014-10-07 NOTE — Telephone Encounter (Signed)
Per staff message and POF I have scheduled appts. Advised scheduler of appts. JMW  

## 2014-10-07 NOTE — Telephone Encounter (Signed)
Per Dr. [name]; notified pt to hold Reglan for now and discuss with Dr. Barry Dienes when pt see her Tuesday 1/19.  Pt verbalized understanding and expressed apprecation for call back.

## 2014-10-07 NOTE — Telephone Encounter (Signed)
Received message from pt stating "I've been off the Reglan for 2 days and seem better; should I come off it completely or does Dr. [name] need to discuss it with Dr. Barry Dienes?"  Note to Dr. [name].

## 2014-10-11 ENCOUNTER — Other Ambulatory Visit (HOSPITAL_BASED_OUTPATIENT_CLINIC_OR_DEPARTMENT_OTHER): Payer: Medicare Other

## 2014-10-11 ENCOUNTER — Other Ambulatory Visit: Payer: Self-pay | Admitting: *Deleted

## 2014-10-11 ENCOUNTER — Other Ambulatory Visit (INDEPENDENT_AMBULATORY_CARE_PROVIDER_SITE_OTHER): Payer: Self-pay | Admitting: General Surgery

## 2014-10-11 ENCOUNTER — Other Ambulatory Visit: Payer: Self-pay | Admitting: General Surgery

## 2014-10-11 ENCOUNTER — Ambulatory Visit (HOSPITAL_BASED_OUTPATIENT_CLINIC_OR_DEPARTMENT_OTHER): Payer: Medicare Other | Admitting: Nurse Practitioner

## 2014-10-11 VITALS — BP 105/60 | HR 55 | Temp 97.5°F | Resp 18 | Ht 63.0 in | Wt 165.9 lb

## 2014-10-11 DIAGNOSIS — R112 Nausea with vomiting, unspecified: Secondary | ICD-10-CM

## 2014-10-11 DIAGNOSIS — C250 Malignant neoplasm of head of pancreas: Secondary | ICD-10-CM

## 2014-10-11 DIAGNOSIS — E860 Dehydration: Secondary | ICD-10-CM

## 2014-10-11 DIAGNOSIS — Z7901 Long term (current) use of anticoagulants: Secondary | ICD-10-CM

## 2014-10-11 DIAGNOSIS — R1030 Lower abdominal pain, unspecified: Secondary | ICD-10-CM

## 2014-10-11 DIAGNOSIS — F419 Anxiety disorder, unspecified: Secondary | ICD-10-CM

## 2014-10-11 LAB — COMPREHENSIVE METABOLIC PANEL (CC13)
ALT: 17 U/L (ref 0–55)
AST: 19 U/L (ref 5–34)
Albumin: 3 g/dL — ABNORMAL LOW (ref 3.5–5.0)
Alkaline Phosphatase: 114 U/L (ref 40–150)
Anion Gap: 11 mEq/L (ref 3–11)
BUN: 12.9 mg/dL (ref 7.0–26.0)
CALCIUM: 8.7 mg/dL (ref 8.4–10.4)
CO2: 24 meq/L (ref 22–29)
Chloride: 101 mEq/L (ref 98–109)
Creatinine: 0.7 mg/dL (ref 0.6–1.1)
EGFR: 85 mL/min/{1.73_m2} — ABNORMAL LOW (ref >90)
GLUCOSE: 122 mg/dL (ref 70–140)
Potassium: 4.2 mEq/L (ref 3.5–5.1)
Sodium: 136 mEq/L (ref 136–145)
Total Bilirubin: 0.26 mg/dL (ref 0.20–1.20)
Total Protein: 6.5 g/dL (ref 6.4–8.3)

## 2014-10-11 MED ORDER — MORPHINE SULFATE 4 MG/ML IJ SOLN
INTRAMUSCULAR | Status: AC
Start: 2014-10-11 — End: 2014-10-11
  Filled 2014-10-11: qty 1

## 2014-10-11 MED ORDER — ONDANSETRON 8 MG/50ML IVPB (CHCC)
8.0000 mg | Freq: Once | INTRAVENOUS | Status: AC
  Administered 2014-10-11: 8 mg via INTRAVENOUS

## 2014-10-11 MED ORDER — SODIUM CHLORIDE 0.9 % IV SOLN
INTRAVENOUS | Status: AC
  Administered 2014-10-11: 15:00:00 via INTRAVENOUS

## 2014-10-11 MED ORDER — LORAZEPAM 1 MG PO TABS: 1.0000 mg | ORAL_TABLET | ORAL | Status: DC | PRN

## 2014-10-11 MED ORDER — MORPHINE SULFATE 4 MG/ML IJ SOLN
2.0000 mg | Freq: Once | INTRAMUSCULAR | Status: AC
  Administered 2014-10-11: 2 mg via INTRAVENOUS

## 2014-10-11 MED ORDER — ONDANSETRON 8 MG/NS 50 ML IVPB
INTRAVENOUS | Status: AC
  Filled 2014-10-11: qty 8

## 2014-10-11 NOTE — Progress Notes (Signed)
Failure to thrive per Dr. Barry Dienes. Asking for her to be seen today with labs and probable IV fluids. Scheduled for [place] at 1:30 today. Notified Dr. Marlowe Aschoff office and they will inform patient.

## 2014-10-11 NOTE — Progress Notes (Signed)
1625 wet to dry dressing change to abd incision. Husband watching. Teaching started.

## 2014-10-12 ENCOUNTER — Encounter: Payer: Self-pay | Admitting: Physician Assistant

## 2014-10-12 ENCOUNTER — Ambulatory Visit (INDEPENDENT_AMBULATORY_CARE_PROVIDER_SITE_OTHER): Payer: Medicare Other | Admitting: Physician Assistant

## 2014-10-12 ENCOUNTER — Telehealth: Payer: Self-pay | Admitting: Oncology

## 2014-10-12 ENCOUNTER — Telehealth: Payer: Self-pay | Admitting: *Deleted

## 2014-10-12 ENCOUNTER — Encounter: Payer: Self-pay | Admitting: Nurse Practitioner

## 2014-10-12 ENCOUNTER — Other Ambulatory Visit: Payer: Self-pay | Admitting: *Deleted

## 2014-10-12 VITALS — BP 108/72 | HR 60 | Ht 63.0 in | Wt 167.0 lb

## 2014-10-12 DIAGNOSIS — F419 Anxiety disorder, unspecified: Secondary | ICD-10-CM | POA: Insufficient documentation

## 2014-10-12 DIAGNOSIS — R112 Nausea with vomiting, unspecified: Secondary | ICD-10-CM | POA: Insufficient documentation

## 2014-10-12 DIAGNOSIS — I2699 Other pulmonary embolism without acute cor pulmonale: Secondary | ICD-10-CM

## 2014-10-12 DIAGNOSIS — Z7901 Long term (current) use of anticoagulants: Secondary | ICD-10-CM | POA: Insufficient documentation

## 2014-10-12 DIAGNOSIS — E860 Dehydration: Secondary | ICD-10-CM | POA: Insufficient documentation

## 2014-10-12 DIAGNOSIS — I10 Essential (primary) hypertension: Secondary | ICD-10-CM

## 2014-10-12 DIAGNOSIS — I480 Paroxysmal atrial fibrillation: Secondary | ICD-10-CM

## 2014-10-12 DIAGNOSIS — C250 Malignant neoplasm of head of pancreas: Secondary | ICD-10-CM

## 2014-10-12 NOTE — Assessment & Plan Note (Signed)
Patient has been diagnosed recently with pulmonary embolisms; and continues to take Xarelto oral therapy.

## 2014-10-12 NOTE — Assessment & Plan Note (Signed)
Patient has been suffering with persistent nausea/vomiting due to gastroparesis since her Whipple surgery on 08/30/2014.  Patient does appear dehydrated today.  Patient received approximately 1500 ML's of normal saline IV fluid rehydration today.

## 2014-10-12 NOTE — Telephone Encounter (Signed)
Patient denies any complaints today, states, "I feel better than I have in the past 4 days". Knows to call us back with any questions or concerns; appreciated call.

## 2014-10-12 NOTE — Telephone Encounter (Signed)
lvm for [name] to confirm that pt should be starting back up on tx on 2.3. I will call pt once confirmation recieved

## 2014-10-12 NOTE — Patient Instructions (Signed)
Your physician recommends that you schedule a follow-up appointment in: 11/07/14 @ 9:50 WITH SCOTT WEAVER, PA SAME DAY DR. Aundra Dubin IS IN THE OFFICE  CALL IF YOUR NAUSEA AND VOMITING CONTINUES; SINCE WE MAY NEED TO TRANSITION FROM XARELTO TO [place]
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
## 2014-10-12 NOTE — Assessment & Plan Note (Signed)
Patient has history of chronic anxiety.  She has been taken lorazepam 1 mg 4 times per day with only little relief of her anxiety.  Have discontinued use of Reglan; in hopes that this would help with her anxiety as well.  Patient does continue to appear fairly anxious; and was occasionally tearful during the exam today.  Patient was given a refill of her lorazepam today.

## 2014-10-12 NOTE — Assessment & Plan Note (Signed)
Patient has been suffering with chronic nausea/vomiting most likely secondary to surgical gastroparesis since her surgery on 08/30/2014.  Patient confirmed that she does have anti-emetics at home to take as directed.  Patient last vomited yesterday.  Patient was given Zofran while at the [facility] today.
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
## 2014-10-12 NOTE — Assessment & Plan Note (Signed)
Patient is status post Whipple surgery per Dr. Barry Dienes on 08/30/2014.  Patient was readmitted to the hospital from 09/14/2014 till 10/06/2014 due to intractable nausea/vomiting most likely secondary to gastroparesis.  Patient continues to suffer with intermittent, chronic nausea and vomiting.  Patient just returned from a follow-up surgical visit with Dr. Barry Dienes earlier this morning.  Patient had been complaining of some progressively increasing pain to the lower portion of her healed lateral surgical site.  Patient reports that lower portion of surgical site was reopened to drain accumulation of fluid today.  Patient currently has gauze packed into open wound.  New wet-to-dry dressing applied to site.  Both patient and her husband report that Dr. Marlowe Aschoff office has advised they will arrange for home health nurse to further manage all dressing changes and instructed them on correct way to change the dressings themselves.  Patient was given both Zofran and morphine IV while at the cancer center receiving her IV fluid rehydration.  The plan is for the patient to initiate adjuvant gemcitabine chemotherapy on 10/19/2014.

## 2014-10-13 ENCOUNTER — Telehealth: Payer: Self-pay | Admitting: Oncology

## 2014-10-13 NOTE — Telephone Encounter (Signed)
s.w. pt and advised on cx and sched appt for Feb...Marland Kitchenpt ok and aware

## 2014-10-14 LAB — WOUND CULTURE: Gram Stain: NONE SEEN

## 2014-10-15 ENCOUNTER — Other Ambulatory Visit: Payer: Self-pay

## 2014-10-15 ENCOUNTER — Inpatient Hospital Stay (HOSPITAL_COMMUNITY)
Admission: EM | Admit: 2014-10-15 | Discharge: 2014-10-17 | DRG: 640 | Disposition: A | Discharge status: Home / Self Care | Payer: Medicare Other | Attending: General Surgery | Admitting: General Surgery

## 2014-10-15 ENCOUNTER — Other Ambulatory Visit (INDEPENDENT_AMBULATORY_CARE_PROVIDER_SITE_OTHER): Payer: Self-pay | Admitting: General Surgery

## 2014-10-15 ENCOUNTER — Encounter (HOSPITAL_COMMUNITY): Payer: Self-pay | Admitting: Nurse Practitioner

## 2014-10-15 DIAGNOSIS — Z96612 Presence of left artificial shoulder joint: Secondary | ICD-10-CM | POA: Diagnosis present

## 2014-10-15 DIAGNOSIS — I10 Essential (primary) hypertension: Secondary | ICD-10-CM | POA: Diagnosis present

## 2014-10-15 DIAGNOSIS — Z808 Family history of malignant neoplasm of other organs or systems: Secondary | ICD-10-CM | POA: Diagnosis not present

## 2014-10-15 DIAGNOSIS — Z8541 Personal history of malignant neoplasm of cervix uteri: Secondary | ICD-10-CM

## 2014-10-15 DIAGNOSIS — Z8249 Family history of ischemic heart disease and other diseases of the circulatory system: Secondary | ICD-10-CM | POA: Diagnosis not present

## 2014-10-15 DIAGNOSIS — T8130XA Disruption of wound, unspecified, initial encounter: Secondary | ICD-10-CM

## 2014-10-15 DIAGNOSIS — Z90710 Acquired absence of both cervix and uterus: Secondary | ICD-10-CM | POA: Diagnosis not present

## 2014-10-15 DIAGNOSIS — Z981 Arthrodesis status: Secondary | ICD-10-CM

## 2014-10-15 DIAGNOSIS — R110 Nausea: Secondary | ICD-10-CM | POA: Diagnosis present

## 2014-10-15 DIAGNOSIS — Z800 Family history of malignant neoplasm of digestive organs: Secondary | ICD-10-CM

## 2014-10-15 DIAGNOSIS — Z86711 Personal history of pulmonary embolism: Secondary | ICD-10-CM | POA: Diagnosis not present

## 2014-10-15 DIAGNOSIS — C259 Malignant neoplasm of pancreas, unspecified: Secondary | ICD-10-CM | POA: Diagnosis present

## 2014-10-15 DIAGNOSIS — E43 Unspecified severe protein-calorie malnutrition: Secondary | ICD-10-CM | POA: Diagnosis present

## 2014-10-15 DIAGNOSIS — Z803 Family history of malignant neoplasm of breast: Secondary | ICD-10-CM | POA: Diagnosis not present

## 2014-10-15 DIAGNOSIS — R42 Dizziness and giddiness: Secondary | ICD-10-CM

## 2014-10-15 DIAGNOSIS — Z96651 Presence of right artificial knee joint: Secondary | ICD-10-CM | POA: Diagnosis present

## 2014-10-15 DIAGNOSIS — E860 Dehydration: Secondary | ICD-10-CM | POA: Diagnosis present

## 2014-10-15 DIAGNOSIS — Z8041 Family history of malignant neoplasm of ovary: Secondary | ICD-10-CM | POA: Diagnosis not present

## 2014-10-15 LAB — CBC WITH DIFFERENTIAL/PLATELET
Basophils Absolute: 0 10*3/uL (ref 0.0–0.1)
Basophils Absolute: 0 10*3/uL (ref 0.0–0.1)
Basophils Relative: 1 % (ref 0–1)
Basophils Relative: 0 % (ref 0–1)
EOS ABS: 0.2 10*3/uL (ref 0.0–0.7)
EOS PCT: 4 % (ref 0–5)
EOS PCT: 4 % (ref 0–5)
Eosinophils Absolute: 0.2 10*3/uL (ref 0.0–0.7)
HCT: 33.5 % — ABNORMAL LOW (ref 36.0–46.0)
HCT: 29.9 % — ABNORMAL LOW (ref 36.0–46.0)
Hemoglobin: 10.9 g/dL — ABNORMAL LOW (ref 12.0–15.0)
Hemoglobin: 9.8 g/dL — ABNORMAL LOW (ref 12.0–15.0)
LYMPHS ABS: 1.3 10*3/uL (ref 0.7–4.0)
LYMPHS PCT: 30 % (ref 12–46)
LYMPHS PCT: 27 % (ref 12–46)
Lymphs Abs: 1.4 10*3/uL (ref 0.7–4.0)
MCH: 28.2 pg (ref 26.0–34.0)
MCH: 28.3 pg (ref 26.0–34.0)
MCHC: 32.5 g/dL (ref 30.0–36.0)
MCHC: 32.8 g/dL (ref 30.0–36.0)
MCV: 86.6 fL (ref 78.0–100.0)
MCV: 86.4 fL (ref 78.0–100.0)
MONO ABS: 0.4 10*3/uL (ref 0.1–1.0)
Monocytes Absolute: 0.4 10*3/uL (ref 0.1–1.0)
Monocytes Relative: 10 % (ref 3–12)
Monocytes Relative: 9 % (ref 3–12)
NEUTROS ABS: 3 10*3/uL (ref 1.7–7.7)
NEUTROS PCT: 60 % (ref 43–77)
Neutro Abs: 2.4 10*3/uL (ref 1.7–7.7)
Neutrophils Relative %: 55 % (ref 43–77)
PLATELETS: 326 10*3/uL (ref 150–400)
Platelets: 289 10*3/uL (ref 150–400)
RBC: 3.87 MIL/uL (ref 3.87–5.11)
RBC: 3.46 MIL/uL — AB (ref 3.87–5.11)
RDW: 13.7 % (ref 11.5–15.5)
RDW: 13.8 % (ref 11.5–15.5)
WBC: 4.3 10*3/uL (ref 4.0–10.5)
WBC: 5 10*3/uL (ref 4.0–10.5)

## 2014-10-15 LAB — COMPREHENSIVE METABOLIC PANEL
ALBUMIN: 2.6 g/dL — AB (ref 3.5–5.2)
ALT: 27 U/L (ref 0–35)
ALT: 24 U/L (ref 0–35)
ANION GAP: 7 (ref 5–15)
AST: 41 U/L — ABNORMAL HIGH (ref 0–37)
AST: 37 U/L (ref 0–37)
Albumin: 3 g/dL — ABNORMAL LOW (ref 3.5–5.2)
Alkaline Phosphatase: 107 U/L (ref 39–117)
Alkaline Phosphatase: 98 U/L (ref 39–117)
Anion gap: 11 (ref 5–15)
BILIRUBIN TOTAL: 0.3 mg/dL (ref 0.3–1.2)
BUN: <5 mg/dL — ABNORMAL LOW (ref 6–23)
CHLORIDE: 98 mmol/L (ref 96–112)
CHLORIDE: 103 mmol/L (ref 96–112)
CO2: 25 mmol/L (ref 19–32)
CO2: 26 mmol/L (ref 19–32)
CREATININE: 0.67 mg/dL (ref 0.50–1.10)
Calcium: 8.8 mg/dL (ref 8.4–10.5)
Calcium: 8.6 mg/dL (ref 8.4–10.5)
Creatinine, Ser: 0.63 mg/dL (ref 0.50–1.10)
GFR calc Af Amer: >90 mL/min (ref >90)
GFR, EST NON AFRICAN AMERICAN: 89 mL/min — AB (ref >90)
Glucose, Bld: 126 mg/dL — ABNORMAL HIGH (ref 70–99)
Glucose, Bld: 161 mg/dL — ABNORMAL HIGH (ref 70–99)
POTASSIUM: 4.1 mmol/L (ref 3.5–5.1)
Potassium: 4.2 mmol/L (ref 3.5–5.1)
SODIUM: 134 mmol/L — AB (ref 135–145)
SODIUM: 136 mmol/L (ref 135–145)
TOTAL PROTEIN: 5.7 g/dL — AB (ref 6.0–8.3)
Total Bilirubin: 0.2 mg/dL — ABNORMAL LOW (ref 0.3–1.2)
Total Protein: 6.4 g/dL (ref 6.0–8.3)

## 2014-10-15 LAB — MAGNESIUM: Magnesium: 2 mg/dL (ref 1.5–2.5)

## 2014-10-15 LAB — PREALBUMIN: PREALBUMIN: 13.3 mg/dL — AB (ref 17.0–34.0)

## 2014-10-15 LAB — BRAIN NATRIURETIC PEPTIDE: B NATRIURETIC PEPTIDE 5: 79 pg/mL (ref 0.0–100.0)

## 2014-10-15 LAB — PHOSPHORUS: PHOSPHORUS: 3.7 mg/dL (ref 2.3–4.6)

## 2014-10-15 MED ORDER — METOPROLOL SUCCINATE ER 25 MG PO TB24
25.0000 mg | ORAL_TABLET | Freq: Every morning | ORAL | Status: DC
  Administered 2014-10-16: 25 mg via ORAL
  Filled 2014-10-15 (×3): qty 1

## 2014-10-15 MED ORDER — LORAZEPAM 2 MG/ML IJ SOLN: 1.0000 mg | Freq: Every evening | INTRAMUSCULAR | Status: DC | PRN

## 2014-10-15 MED ORDER — OXYCODONE HCL 5 MG PO TABS: 5.0000 mg | ORAL_TABLET | ORAL | Status: DC | PRN

## 2014-10-15 MED ORDER — ONDANSETRON HCL 4 MG/2ML IJ SOLN
4.0000 mg | Freq: Four times a day (QID) | INTRAMUSCULAR | Status: DC
  Administered 2014-10-15 – 2014-10-17 (×4): 4 mg via INTRAVENOUS
  Filled 2014-10-15 (×4): qty 2

## 2014-10-15 MED ORDER — DIPHENHYDRAMINE HCL 12.5 MG/5ML PO ELIX
12.5000 mg | ORAL_SOLUTION | Freq: Four times a day (QID) | ORAL | Status: DC | PRN
  Administered 2014-10-15: 12.5 mg via ORAL
  Filled 2014-10-15: qty 10

## 2014-10-15 MED ORDER — ACETAMINOPHEN 325 MG PO TABS: 650.0000 mg | ORAL_TABLET | Freq: Four times a day (QID) | ORAL | Status: DC | PRN

## 2014-10-15 MED ORDER — RIVAROXABAN 20 MG PO TABS
20.0000 mg | ORAL_TABLET | Freq: Every day | ORAL | Status: DC
  Administered 2014-10-16: 20 mg via ORAL
  Filled 2014-10-15 (×2): qty 1

## 2014-10-15 MED ORDER — SODIUM CHLORIDE 0.9 % IV BOLUS (SEPSIS)
500.0000 mL | Freq: Once | INTRAVENOUS | Status: AC
  Administered 2014-10-15: 500 mL via INTRAVENOUS

## 2014-10-15 MED ORDER — MORPHINE SULFATE 2 MG/ML IJ SOLN: 2.0000 mg | INTRAMUSCULAR | Status: DC | PRN

## 2014-10-15 MED ORDER — SODIUM CHLORIDE 0.9 % IV SOLN: INTRAVENOUS | Status: DC

## 2014-10-15 MED ORDER — FLUTICASONE PROPIONATE 50 MCG/ACT NA SUSP: 1.0000 | Freq: Two times a day (BID) | NASAL | Status: DC | PRN

## 2014-10-15 MED ORDER — ONDANSETRON HCL 4 MG/2ML IJ SOLN
4.0000 mg | Freq: Once | INTRAMUSCULAR | Status: AC
  Administered 2014-10-15: 4 mg via INTRAVENOUS
  Filled 2014-10-15: qty 2

## 2014-10-15 MED ORDER — ENSURE COMPLETE PO LIQD
237.0000 mL | Freq: Every day | ORAL | Status: DC
  Administered 2014-10-15 – 2014-10-17 (×3): 237 mL via ORAL
  Filled 2014-10-15: qty 237

## 2014-10-15 MED ORDER — PNEUMOCOCCAL VAC POLYVALENT 25 MCG/0.5ML IJ INJ
0.5000 mL | INJECTION | INTRAMUSCULAR | Status: DC
  Filled 2014-10-15: qty 0.5

## 2014-10-15 MED ORDER — PANTOPRAZOLE SODIUM 40 MG IV SOLR: 40.0000 mg | Freq: Every day | INTRAVENOUS | Status: DC

## 2014-10-15 MED ORDER — ACETAMINOPHEN 650 MG RE SUPP: 650.0000 mg | Freq: Four times a day (QID) | RECTAL | Status: DC | PRN

## 2014-10-15 MED ORDER — KCL IN DEXTROSE-NACL 20-5-0.45 MEQ/L-%-% IV SOLN
INTRAVENOUS | Status: DC
  Administered 2014-10-15 – 2014-10-17 (×3): via INTRAVENOUS
  Filled 2014-10-15 (×7): qty 1000

## 2014-10-15 MED ORDER — SODIUM CHLORIDE 0.9 % IV SOLN: INTRAVENOUS | Status: AC

## 2014-10-15 MED ORDER — MORPHINE SULFATE 2 MG/ML IJ SOLN: 1.0000 mg | INTRAMUSCULAR | Status: DC | PRN

## 2014-10-15 MED ORDER — PANTOPRAZOLE SODIUM 40 MG IV SOLR
40.0000 mg | Freq: Every day | INTRAVENOUS | Status: DC
  Administered 2014-10-15: 40 mg via INTRAVENOUS
  Filled 2014-10-15 (×2): qty 40

## 2014-10-15 MED ORDER — LORAZEPAM 2 MG/ML IJ SOLN
0.5000 mg | Freq: Once | INTRAMUSCULAR | Status: AC
  Administered 2014-10-15: 0.5 mg via INTRAVENOUS
  Filled 2014-10-15: qty 1

## 2014-10-15 MED ORDER — RIVAROXABAN 15 MG PO TABS
15.0000 mg | ORAL_TABLET | Freq: Once | ORAL | Status: AC
  Administered 2014-10-15: 15 mg via ORAL
  Filled 2014-10-15: qty 1

## 2014-10-15 MED ORDER — LOSARTAN POTASSIUM 50 MG PO TABS
50.0000 mg | ORAL_TABLET | Freq: Every morning | ORAL | Status: DC
  Administered 2014-10-16 – 2014-10-17 (×2): 50 mg via ORAL
  Filled 2014-10-15 (×3): qty 1

## 2014-10-15 MED ORDER — DIPHENHYDRAMINE HCL 50 MG/ML IJ SOLN: 12.5000 mg | Freq: Four times a day (QID) | INTRAMUSCULAR | Status: DC | PRN

## 2014-10-15 MED ORDER — ONDANSETRON HCL 4 MG/2ML IJ SOLN
4.0000 mg | Freq: Four times a day (QID) | INTRAMUSCULAR | Status: DC | PRN
  Filled 2014-10-15: qty 2

## 2014-10-15 MED ORDER — FLECAINIDE ACETATE 100 MG PO TABS
100.0000 mg | ORAL_TABLET | Freq: Two times a day (BID) | ORAL | Status: DC
  Administered 2014-10-15 – 2014-10-17 (×4): 100 mg via ORAL
  Filled 2014-10-15 (×5): qty 1

## 2014-10-15 MED ORDER — SODIUM CHLORIDE 0.9 % IV SOLN
INTRAVENOUS | Status: DC
  Administered 2014-10-15: 14:00:00 via INTRAVENOUS

## 2014-10-15 NOTE — ED Notes (Signed)
Spoke with Pharmacy Ensure will be sent shortly.

## 2014-10-15 NOTE — ED Notes (Signed)
Pt is in a gown and on the monitor. 

## 2014-10-15 NOTE — ED Notes (Signed)
Pt is currently under care of [facility] for infection of abd surgical incision site. She had whipple procedure in December and has had some complications since. She is feeling worse today, is very dizzy. She called dr Barry Dienes who told the pt to come to ED for further treatment. A&Ox4

## 2014-10-15 NOTE — ED Notes (Signed)
Report attempt x 1 

## 2014-10-15 NOTE — ED Notes (Signed)
Pt reports no relief of dizziness after ativan, but it is noted that patient has eyes opened and is no longer gripping side rails
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
## 2014-10-16 LAB — CBC
HCT: 29.2 % — ABNORMAL LOW (ref 36.0–46.0)
Hemoglobin: 9.4 g/dL — ABNORMAL LOW (ref 12.0–15.0)
MCH: 27.8 pg (ref 26.0–34.0)
MCHC: 32.2 g/dL (ref 30.0–36.0)
MCV: 86.4 fL (ref 78.0–100.0)
PLATELETS: 272 10*3/uL (ref 150–400)
RBC: 3.38 MIL/uL — ABNORMAL LOW (ref 3.87–5.11)
RDW: 13.8 % (ref 11.5–15.5)
WBC: 4.1 10*3/uL (ref 4.0–10.5)

## 2014-10-16 LAB — URINALYSIS, ROUTINE W REFLEX MICROSCOPIC
Bilirubin Urine: NEGATIVE
Glucose, UA: NEGATIVE mg/dL
Hgb urine dipstick: NEGATIVE
Ketones, ur: NEGATIVE mg/dL
Leukocytes, UA: NEGATIVE
Nitrite: NEGATIVE
Protein, ur: NEGATIVE mg/dL
Specific Gravity, Urine: 1.005 (ref 1.005–1.030)
Urobilinogen, UA: 0.2 mg/dL (ref 0.0–1.0)
pH: 7.5 (ref 5.0–8.0)

## 2014-10-16 LAB — MAGNESIUM: Magnesium: 1.9 mg/dL (ref 1.5–2.5)

## 2014-10-16 LAB — BASIC METABOLIC PANEL
Anion gap: 8 (ref 5–15)
BUN: <5 mg/dL — ABNORMAL LOW (ref 6–23)
CO2: 25 mmol/L (ref 19–32)
Calcium: 8.4 mg/dL (ref 8.4–10.5)
Chloride: 102 mmol/L (ref 96–112)
Creatinine, Ser: 0.61 mg/dL (ref 0.50–1.10)
GFR calc Af Amer: >90 mL/min (ref >90)
Glucose, Bld: 139 mg/dL — ABNORMAL HIGH (ref 70–99)
Potassium: 4.4 mmol/L (ref 3.5–5.1)
Sodium: 135 mmol/L (ref 135–145)

## 2014-10-16 LAB — PHOSPHORUS: Phosphorus: 3.7 mg/dL (ref 2.3–4.6)

## 2014-10-16 LAB — PREALBUMIN: Prealbumin: 12.5 mg/dL — ABNORMAL LOW (ref 17.0–34.0)

## 2014-10-16 MED ORDER — ZOLPIDEM TARTRATE 5 MG PO TABS
5.0000 mg | ORAL_TABLET | Freq: Every evening | ORAL | Status: DC | PRN
  Administered 2014-10-16: 5 mg via ORAL
  Filled 2014-10-16: qty 1

## 2014-10-16 MED ORDER — PANTOPRAZOLE SODIUM 40 MG PO TBEC
40.0000 mg | DELAYED_RELEASE_TABLET | Freq: Every day | ORAL | Status: DC
  Administered 2014-10-16 – 2014-10-17 (×2): 40 mg via ORAL
  Filled 2014-10-16 (×2): qty 1

## 2014-10-16 NOTE — Progress Notes (Signed)
Patient ID: [name], female   DOB: 1946/04/21, 69 y.o.   MRN: [id number]  General Surgery - [facility] - Progress Note  HD# 2  Subjective: Patient up to bathroom and in chair, husband at bedside.  Dizziness improved this AM but still present.  Ate breakfast - egg.  Objective: Vital signs in last 24 hours: Temp:  [97.7 F (36.5 C)-99.2 F (37.3 C)] 98.5 F (36.9 C) (01/24 0548) Pulse Rate:  [55-66] 58 (01/24 0548) Resp:  [14-22] 17 (01/24 0548) BP: (95-119)/(55-84) 102/64 mmHg (01/24 0548) SpO2:  [95 %-100 %] 95 % (01/24 0548) Last BM Date: 10/15/14  Intake/Output from previous day: 01/23 0701 - 01/24 0700 In: 1580 [P.O.:280; I.V.:1300] Out: 300 [Urine:300]  Exam: HEENT - clear, not icteric Neck - soft Chest - clear bilaterally Cor - RRR, no murmur Abd - soft without distension; midline dressing dry and intact; non-tender Ext - no significant edema Neuro - grossly intact, no focal deficits  Lab Results:   Recent Labs  10/15/14 1850 10/16/14 0624  WBC 5.0 4.1  HGB 9.8* 9.4*  HCT 29.9* 29.2*  PLT 289 272     Recent Labs  10/15/14 1850 10/16/14 0624  NA 136 135  K 4.2 4.4  CL 103 102  CO2 26 25  GLUCOSE 161* 139*  BUN <5* <5*  CREATININE 0.63 0.61  CALCIUM 8.6 8.4    Studies/Results: No results found.  Assessment / Plan: 1.  Status post Whipple  Nausea improved  Dizziness improved with reduced meds - ? Vertigo  Continue IVF  Continue Zofran  Encouraged OOB, ambulation with assistance  Per Dr. Gaspar Skeeters, MD, [facility] Office: [phone]  10/16/2014

## 2014-10-17 ENCOUNTER — Encounter (HOSPITAL_COMMUNITY): Payer: Self-pay | Admitting: *Deleted

## 2014-10-17 MED ORDER — DM-GUAIFENESIN ER 30-600 MG PO TB12: 1.0000 | ORAL_TABLET | Freq: Three times a day (TID) | ORAL | Status: DC | PRN

## 2014-10-17 MED ORDER — LORAZEPAM 1 MG PO TABS: 0.5000 mg | ORAL_TABLET | Freq: Three times a day (TID) | ORAL | Status: DC | PRN

## 2014-10-17 MED ORDER — OMEPRAZOLE 20 MG PO CPDR: 40.0000 mg | DELAYED_RELEASE_CAPSULE | Freq: Two times a day (BID) | ORAL | Status: DC

## 2014-10-17 NOTE — Progress Notes (Signed)
Please put orders in Epic for Same day surgery 10-21-14 Thanks

## 2014-10-17 NOTE — Discharge Summary (Signed)
Physician Discharge Summary  Patient ID: [name] MRN: [id number] DOB/AGE: April 11, 1946 69 y.o.  Admit date: 10/15/2014 Discharge date: 10/17/2014  Admission Diagnoses: Delayed gastric emptying. Pancreatic cancer Severe protein calorie malnutrition.   Dehydration Dizziness Atrial fibrillation   Discharge Diagnoses:  Same  Discharged Condition: stable  Hospital Course:  Patient was admitted to the hospital with failure to thrive, severe nausea and vomiting, and diarrhea.  She was around 2-3 weeks s/p whipple.  She was placed on TNA and heparin.  Xarelto was restarted once she was more stable.  She required an NGT at first.  This was removed and she was able to do clears.  She developed n/v when full liquids were restarted.  She had to be backed off again.  We tried multiple different nausea regimens.  Reglan made her tremulous and anxious.  Zofran alone did not work.  Phenergan also made her sleepy.  Eventually, she was able to keep down enough to go home and have the TNA stopped.  She was discharged home in stable condition.    Consults: cardiology  Significant Diagnostic Studies: labs: see epic  Treatments: IV hydration and TPN  Discharge Exam: Blood pressure 91/40, pulse 59, temperature 98.6 F (37 C), temperature source Oral, resp. rate 17, SpO2 98 %. General appearance: alert, cooperative and no distress Resp: breathing comfortably GI: soft, non tender, non distended.  wound with minimal packing.    Disposition: 01-Home or Self Care  Discharge Instructions    Call MD for:  persistant nausea and vomiting    Complete by:  As directed      Call MD for:  redness, tenderness, or signs of infection (pain, swelling, redness, odor or green/yellow discharge around incision site)    Complete by:  As directed      Call MD for:  severe uncontrolled pain    Complete by:  As directed      Call MD for:  temperature >100.4    Complete by:  As directed      Change dressing  (specify)    Complete by:  As directed   Wet to dry daily to abdominal wound.     Diet - low sodium heart healthy    Complete by:  As directed      Increase activity slowly    Complete by:  As directed             Medication List    STOP taking these medications        promethazine 12.5 MG tablet  Commonly known as:  PHENERGAN      TAKE these medications        acetaminophen 500 MG tablet  Commonly known as:  TYLENOL  Take 1,000 mg by mouth every 6 (six) hours as needed for pain.     CENTRUM SILVER PO  Take 1 tablet by mouth every morning.     dextromethorphan-guaiFENesin 30-600 MG per 12 hr tablet  Commonly known as:  MUCINEX DM  Take 1 tablet by mouth 3 (three) times daily as needed.     Eszopiclone 3 MG Tabs  Take 1.5 mg by mouth at bedtime.     feeding supplement (ENSURE COMPLETE) Liqd  Take 237 mLs by mouth daily. Chocolate     flecainide 100 MG tablet  Commonly known as:  TAMBOCOR  Take 100 mg by mouth 2 (two) times daily.     fluticasone 50 MCG/ACT nasal spray  Commonly known as:  [place]  1 spray into both nostrils 2 (two) times daily as needed.     lidocaine-prilocaine cream  Commonly known as:  EMLA  Apply small amount over port area 1-2 hours prior to treatment and cover with plastic wrap.  DO NOT RUB IN.     LORazepam 1 MG tablet  Commonly known as:  ATIVAN  Take 0.5 tablets (0.5 mg total) by mouth every 8 (eight) hours as needed for anxiety.     losartan 50 MG tablet  Commonly known as:  COZAAR  Take 50 mg by mouth every morning.     metoprolol succinate 25 MG 24 hr tablet  Commonly known as:  TOPROL-XL  Take 25 mg by mouth every morning.     omeprazole 20 MG capsule  Commonly known as:  PRILOSEC  Take 2 capsules (40 mg total) by mouth 2 (two) times daily before a meal.     ondansetron 4 MG tablet  Commonly known as:  ZOFRAN  Take 1 tablet (4 mg total) by mouth every 6 (six) hours as needed for nausea or vomiting.      oxyCODONE-acetaminophen 5-325 MG per tablet  Commonly known as:  PERCOCET/ROXICET  Take 1-2 tablets by mouth every 4 (four) hours as needed for moderate pain.     rivaroxaban 20 MG Tabs tablet  Commonly known as:  XARELTO  Take 1 tablet (20 mg total) by mouth at bedtime.     vitamin C 500 MG tablet  Commonly known as:  ASCORBIC ACID  Take 500 mg by mouth every morning.         SignedStark Klein 10/17/2014, 9:52 AM

## 2014-10-17 NOTE — Care Management Note (Signed)
  Page 1 of 1   10/17/2014     10:47:17 AM CARE MANAGEMENT NOTE 10/17/2014  Patient:  [name], [name]   Account Number:  [id number]  Date Initiated:  10/17/2014  Documentation initiated by:  Magdalen Spatz  Subjective/Objective Assessment:     Action/Plan:   Anticipated DC Date:  10/17/2014   Anticipated DC Plan:  HOME/SELF CARE         Choice offered to / List presented to:             Status of service:   Medicare Important Message given?  YES (If response is "NO", the following Medicare IM given date fields will be blank) Date Medicare IM given:  10/17/2014 Medicare IM given by:  Magdalen Spatz Date Additional Medicare IM given:   Additional Medicare IM given by:    Discharge Disposition:    Per UR Regulation:    If discussed at Long Length of Stay Meetings, dates discussed:    Comments:

## 2014-10-18 ENCOUNTER — Other Ambulatory Visit (INDEPENDENT_AMBULATORY_CARE_PROVIDER_SITE_OTHER): Payer: Self-pay | Admitting: General Surgery

## 2014-10-18 ENCOUNTER — Ambulatory Visit: Payer: Self-pay | Admitting: Oncology
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
## 2014-10-19 ENCOUNTER — Telehealth: Payer: Self-pay | Admitting: *Deleted

## 2014-10-19 ENCOUNTER — Other Ambulatory Visit: Payer: Medicare Other

## 2014-10-19 ENCOUNTER — Ambulatory Visit: Payer: Medicare Other | Admitting: Nurse Practitioner

## 2014-10-19 ENCOUNTER — Ambulatory Visit: Payer: Medicare Other

## 2014-10-19 NOTE — Telephone Encounter (Signed)
OK to hold Xarelto for 48 hours prior

## 2014-10-19 NOTE — Telephone Encounter (Signed)
Will forward to Dr Barry Dienes

## 2014-10-19 NOTE — Telephone Encounter (Signed)
-----   Message from Jeralyn Ruths, [place] sent at 10/18/2014 10:20 AM EST ----- [name], [name] is scheduled for [facility] placement on 10/21/14.  She will need to stop her Xarelto.  Does she need any further instructions?  Please advise.  Thanks, Jeralyn Ruths, [place] For Dr. [name]

## 2014-10-19 NOTE — Telephone Encounter (Signed)
I will forward to Dr McLean for review 

## 2014-10-21 ENCOUNTER — Encounter (HOSPITAL_COMMUNITY): Payer: Self-pay | Admitting: *Deleted

## 2014-10-21 ENCOUNTER — Ambulatory Visit (HOSPITAL_COMMUNITY): Payer: Medicare Other

## 2014-10-21 ENCOUNTER — Ambulatory Visit (HOSPITAL_COMMUNITY)
Admission: RE | Admit: 2014-10-21 | Discharge: 2014-10-21 | Disposition: A | Discharge status: Home / Self Care | Payer: Medicare Other | Source: Ambulatory Visit | Attending: General Surgery | Admitting: General Surgery

## 2014-10-21 ENCOUNTER — Ambulatory Visit (HOSPITAL_COMMUNITY): Payer: Medicare Other | Admitting: Anesthesiology

## 2014-10-21 ENCOUNTER — Encounter (HOSPITAL_COMMUNITY)
Admission: RE | Disposition: A | Discharge status: Home / Self Care | Payer: Self-pay | Source: Ambulatory Visit | Attending: General Surgery

## 2014-10-21 DIAGNOSIS — D649 Anemia, unspecified: Secondary | ICD-10-CM | POA: Diagnosis not present

## 2014-10-21 DIAGNOSIS — Z8542 Personal history of malignant neoplasm of other parts of uterus: Secondary | ICD-10-CM | POA: Diagnosis not present

## 2014-10-21 DIAGNOSIS — I509 Heart failure, unspecified: Secondary | ICD-10-CM | POA: Insufficient documentation

## 2014-10-21 DIAGNOSIS — Z452 Encounter for adjustment and management of vascular access device: Secondary | ICD-10-CM | POA: Insufficient documentation

## 2014-10-21 DIAGNOSIS — Z6829 Body mass index (BMI) 29.0-29.9, adult: Secondary | ICD-10-CM | POA: Diagnosis not present

## 2014-10-21 DIAGNOSIS — K219 Gastro-esophageal reflux disease without esophagitis: Secondary | ICD-10-CM | POA: Insufficient documentation

## 2014-10-21 DIAGNOSIS — I10 Essential (primary) hypertension: Secondary | ICD-10-CM | POA: Insufficient documentation

## 2014-10-21 DIAGNOSIS — I480 Paroxysmal atrial fibrillation: Secondary | ICD-10-CM | POA: Insufficient documentation

## 2014-10-21 DIAGNOSIS — M1990 Unspecified osteoarthritis, unspecified site: Secondary | ICD-10-CM | POA: Diagnosis not present

## 2014-10-21 DIAGNOSIS — C779 Secondary and unspecified malignant neoplasm of lymph node, unspecified: Secondary | ICD-10-CM | POA: Insufficient documentation

## 2014-10-21 DIAGNOSIS — C259 Malignant neoplasm of pancreas, unspecified: Secondary | ICD-10-CM | POA: Insufficient documentation

## 2014-10-21 DIAGNOSIS — Z95828 Presence of other vascular implants and grafts: Secondary | ICD-10-CM

## 2014-10-21 DIAGNOSIS — G4730 Sleep apnea, unspecified: Secondary | ICD-10-CM | POA: Diagnosis not present

## 2014-10-21 DIAGNOSIS — I252 Old myocardial infarction: Secondary | ICD-10-CM | POA: Diagnosis not present

## 2014-10-21 HISTORY — PX: PORTACATH PLACEMENT: SHX2246

## 2014-10-21 LAB — BASIC METABOLIC PANEL
Anion gap: 10 (ref 5–15)
BUN: 13 mg/dL (ref 6–23)
CALCIUM: 8.6 mg/dL (ref 8.4–10.5)
CO2: 26 mmol/L (ref 19–32)
CREATININE: 0.61 mg/dL (ref 0.50–1.10)
Chloride: 100 mmol/L (ref 96–112)
GFR calc Af Amer: >90 mL/min (ref >90)
GFR calc non Af Amer: >90 mL/min (ref >90)
Glucose, Bld: 141 mg/dL — ABNORMAL HIGH (ref 70–99)
Potassium: 4.1 mmol/L (ref 3.5–5.1)
SODIUM: 136 mmol/L (ref 135–145)

## 2014-10-21 LAB — CBC
HCT: 31.3 % — ABNORMAL LOW (ref 36.0–46.0)
Hemoglobin: 10.3 g/dL — ABNORMAL LOW (ref 12.0–15.0)
MCH: 28.7 pg (ref 26.0–34.0)
MCHC: 32.9 g/dL (ref 30.0–36.0)
MCV: 87.2 fL (ref 78.0–100.0)
Platelets: 255 10*3/uL (ref 150–400)
RBC: 3.59 MIL/uL — ABNORMAL LOW (ref 3.87–5.11)
RDW: 13.9 % (ref 11.5–15.5)
WBC: 10.4 10*3/uL (ref 4.0–10.5)

## 2014-10-21 SURGERY — INSERTION, TUNNELED CENTRAL VENOUS DEVICE, WITH PORT
Anesthesia: General | Site: Chest | Laterality: Left

## 2014-10-21 MED ORDER — CEFAZOLIN SODIUM-DEXTROSE 2-3 GM-% IV SOLR
2.0000 g | INTRAVENOUS | Status: AC
  Administered 2014-10-21: 2 g via INTRAVENOUS

## 2014-10-21 MED ORDER — BUPIVACAINE-EPINEPHRINE 0.25% -1:200000 IJ SOLN
INTRAMUSCULAR | Status: DC | PRN
  Administered 2014-10-21: 5 mL

## 2014-10-21 MED ORDER — SODIUM CHLORIDE 0.9 % IJ SOLN
3.0000 mL | Freq: Two times a day (BID) | INTRAMUSCULAR | Status: DC
Start: 2014-10-21 — End: 2014-10-21

## 2014-10-21 MED ORDER — ACETAMINOPHEN 650 MG RE SUPP
650.0000 mg | RECTAL | Status: DC | PRN
  Filled 2014-10-21: qty 1

## 2014-10-21 MED ORDER — ONDANSETRON HCL 4 MG/2ML IJ SOLN
INTRAMUSCULAR | Status: DC | PRN
  Administered 2014-10-21: 4 mg via INTRAVENOUS

## 2014-10-21 MED ORDER — MIDAZOLAM HCL 2 MG/2ML IJ SOLN
INTRAMUSCULAR | Status: AC
  Filled 2014-10-21: qty 2

## 2014-10-21 MED ORDER — CEFAZOLIN SODIUM-DEXTROSE 2-3 GM-% IV SOLR
INTRAVENOUS | Status: AC
  Filled 2014-10-21: qty 50

## 2014-10-21 MED ORDER — FENTANYL CITRATE 0.05 MG/ML IJ SOLN
INTRAMUSCULAR | Status: AC
  Filled 2014-10-21: qty 2

## 2014-10-21 MED ORDER — LIDOCAINE HCL 1 % IJ SOLN
INTRAMUSCULAR | Status: AC
  Filled 2014-10-21: qty 20

## 2014-10-21 MED ORDER — FENTANYL CITRATE 0.05 MG/ML IJ SOLN
INTRAMUSCULAR | Status: DC | PRN
  Administered 2014-10-21 (×2): 50 ug via INTRAVENOUS

## 2014-10-21 MED ORDER — HEPARIN SOD (PORK) LOCK FLUSH 100 UNIT/ML IV SOLN
INTRAVENOUS | Status: DC | PRN
  Administered 2014-10-21: 500 [IU]

## 2014-10-21 MED ORDER — PROMETHAZINE HCL 25 MG/ML IJ SOLN: 6.2500 mg | INTRAMUSCULAR | Status: DC | PRN

## 2014-10-21 MED ORDER — OXYCODONE HCL 5 MG PO TABS: 5.0000 mg | ORAL_TABLET | ORAL | Status: DC | PRN

## 2014-10-21 MED ORDER — BUPIVACAINE-EPINEPHRINE (PF) 0.25% -1:200000 IJ SOLN
INTRAMUSCULAR | Status: AC
  Filled 2014-10-21: qty 30

## 2014-10-21 MED ORDER — MIDAZOLAM HCL 5 MG/5ML IJ SOLN
INTRAMUSCULAR | Status: DC | PRN
  Administered 2014-10-21: 2 mg via INTRAVENOUS

## 2014-10-21 MED ORDER — HEPARIN SODIUM (PORCINE) 5000 UNIT/ML IJ SOLN
Freq: Once | INTRAMUSCULAR | Status: DC
  Filled 2014-10-21: qty 1.2

## 2014-10-21 MED ORDER — SODIUM CHLORIDE 0.9 % IR SOLN
Status: DC | PRN
  Administered 2014-10-21: 15 mL

## 2014-10-21 MED ORDER — MEPERIDINE HCL 50 MG/ML IJ SOLN: 6.2500 mg | INTRAMUSCULAR | Status: DC | PRN

## 2014-10-21 MED ORDER — PROPOFOL 10 MG/ML IV BOLUS
INTRAVENOUS | Status: AC
  Filled 2014-10-21: qty 20

## 2014-10-21 MED ORDER — LACTATED RINGERS IV SOLN: INTRAVENOUS | Status: DC

## 2014-10-21 MED ORDER — HEPARIN SOD (PORK) LOCK FLUSH 100 UNIT/ML IV SOLN
INTRAVENOUS | Status: AC
  Filled 2014-10-21: qty 5

## 2014-10-21 MED ORDER — FENTANYL CITRATE 0.05 MG/ML IJ SOLN
25.0000 ug | INTRAMUSCULAR | Status: DC | PRN
Start: 2014-10-21 — End: 2014-10-21

## 2014-10-21 MED ORDER — SODIUM CHLORIDE 0.9 % IJ SOLN
3.0000 mL | INTRAMUSCULAR | Status: DC | PRN
Start: 2014-10-21 — End: 2014-10-21

## 2014-10-21 MED ORDER — ONDANSETRON HCL 4 MG/2ML IJ SOLN
INTRAMUSCULAR | Status: AC
  Filled 2014-10-21: qty 2

## 2014-10-21 MED ORDER — LIDOCAINE HCL 1 % IJ SOLN
INTRAMUSCULAR | Status: DC | PRN
  Administered 2014-10-21: 5 mL

## 2014-10-21 MED ORDER — PROPOFOL 10 MG/ML IV BOLUS
INTRAVENOUS | Status: DC | PRN
  Administered 2014-10-21: 150 mg via INTRAVENOUS

## 2014-10-21 MED ORDER — ACETAMINOPHEN 325 MG PO TABS: 650.0000 mg | ORAL_TABLET | ORAL | Status: DC | PRN

## 2014-10-21 MED ORDER — LACTATED RINGERS IV SOLN
INTRAVENOUS | Status: DC | PRN
  Administered 2014-10-21: 08:00:00 via INTRAVENOUS

## 2014-10-21 MED ORDER — SODIUM CHLORIDE 0.9 % IV SOLN: 250.0000 mL | INTRAVENOUS | Status: DC | PRN

## 2014-10-21 SURGICAL SUPPLY — 32 items
BAG DECANTER FOR FLEXI CONT (MISCELLANEOUS) ×3 IMPLANT
BLADE HEX COATED 2.75 (ELECTRODE) ×3 IMPLANT
BLADE SURG 15 STRL LF DISP TIS (BLADE) ×1 IMPLANT
BLADE SURG 15 STRL SS (BLADE) ×3
BLADE SURG SZ11 CARB STEEL (BLADE) ×3 IMPLANT
CHLORAPREP W/TINT 26ML (MISCELLANEOUS) ×3 IMPLANT
DECANTER SPIKE VIAL GLASS SM (MISCELLANEOUS) ×3 IMPLANT
DRAPE C-ARM 42X120 X-RAY (DRAPES) ×3 IMPLANT
DRAPE LAPAROTOMY TRNSV 102X78 (DRAPE) ×3 IMPLANT
DRAPE UTILITY XL STRL (DRAPES) ×3 IMPLANT
ELECT REM PT RETURN 9FT ADLT (ELECTROSURGICAL) ×3
ELECTRODE REM PT RTRN 9FT ADLT (ELECTROSURGICAL) ×1 IMPLANT
GAUZE SPONGE 4X4 16PLY XRAY LF (GAUZE/BANDAGES/DRESSINGS) ×3 IMPLANT
GLOVE BIO SURGEON STRL SZ 6 (GLOVE) ×3 IMPLANT
GLOVE INDICATOR 6.5 STRL GRN (GLOVE) ×3 IMPLANT
GOWN STRL REUS W/TWL 2XL LVL3 (GOWN DISPOSABLE) ×3 IMPLANT
GOWN STRL REUS W/TWL XL LVL3 (GOWN DISPOSABLE) ×3 IMPLANT
KIT BASIN OR (CUSTOM PROCEDURE TRAY) ×3 IMPLANT
KIT PORT POWER 8FR ISP CVUE (Catheter) ×2 IMPLANT
LIQUID BAND (GAUZE/BANDAGES/DRESSINGS) ×3 IMPLANT
NEEDLE HYPO 22GX1.5 SAFETY (NEEDLE) ×3 IMPLANT
PACK BASIC VI WITH GOWN DISP (CUSTOM PROCEDURE TRAY) ×3 IMPLANT
PENCIL BUTTON HOLSTER BLD 10FT (ELECTRODE) ×3 IMPLANT
SUT MNCRL AB 4-0 PS2 18 (SUTURE) ×3 IMPLANT
SUT PROLENE 2 0 SH DA (SUTURE) ×6 IMPLANT
SUT VIC AB 3-0 SH 27 (SUTURE) ×3
SUT VIC AB 3-0 SH 27X BRD (SUTURE) ×1 IMPLANT
SYR CONTROL 10ML LL (SYRINGE) ×3 IMPLANT
SYRINGE 10CC LL (SYRINGE) ×3 IMPLANT
TOWEL OR 17X26 10 PK STRL BLUE (TOWEL DISPOSABLE) ×3 IMPLANT
TOWEL OR NON WOVEN STRL DISP B (DISPOSABLE) ×3 IMPLANT
YANKAUER SUCT BULB TIP 10FT TU (MISCELLANEOUS) IMPLANT

## 2014-10-21 NOTE — Interval H&P Note (Signed)
History and Physical Interval Note:  10/21/2014 8:16 AM  [name]  has presented today for surgery, with the diagnosis of ADENOCARCINOMA OF HEAD OF PANCREAS  The various methods of treatment have been discussed with the patient and family. After consideration of risks, benefits and other options for treatment, the patient has consented to  Procedure(s): INSERTION PORT-A-CATH (N/A) as a surgical intervention .  The patient's history has been reviewed, patient examined, no change in status, stable for surgery.  I have reviewed the patient's chart and labs.  Questions were answered to the patient's satisfaction.     [name]

## 2014-10-21 NOTE — Discharge Instructions (Addendum)
[facility] °Office Phone Number [phone] ° ° POST OP INSTRUCTIONS ° °Always review your discharge instruction sheet given to you by the facility where your surgery was performed. ° °IF YOU HAVE DISABILITY OR FAMILY LEAVE FORMS, YOU MUST BRING THEM TO THE OFFICE FOR PROCESSING.  DO NOT GIVE THEM TO YOUR DOCTOR. ° °1. A prescription for pain medication may be given to you upon discharge.  Take your pain medication as prescribed, if needed.  If narcotic pain medicine is not needed, then you may take acetaminophen (Tylenol) or ibuprofen (Advil) as needed. °2. Take your usually prescribed medications unless otherwise directed °3. If you need a refill on your pain medication, please contact your pharmacy.  They will contact our office to request authorization.  Prescriptions will not be filled after 5pm or on week-ends. °4. You should eat very light the first 24 hours after surgery, such as soup, crackers, pudding, etc.  Resume your normal diet the day after surgery °5. It is common to experience some constipation if taking pain medication after surgery.  Increasing fluid intake and taking a stool softener will usually help or prevent this problem from occurring.  A mild laxative (Milk of Magnesia or Miralax) should be taken according to package directions if there are no bowel movements after 48 hours. °6. You may shower in 48 hours.  The surgical glue will flake off in 2-3 weeks.   °7. ACTIVITIES:  No strenuous activity or heavy lifting for 1 week.   °a. You may drive when you no longer are taking prescription pain medication, you can comfortably wear a seatbelt, and you can safely maneuver your car and apply brakes. °b. RETURN TO WORK:  __________to be determined._______________ °You should see your doctor in the office for a follow-up appointment approximately three-four weeks after your surgery.   ° °WHEN TO CALL YOUR DOCTOR: °1. Fever over 101.0 °2. Nausea and/or vomiting. °3. Extreme swelling  or bruising. °4. Continued bleeding from incision. °5. Increased pain, redness, or drainage from the incision. ° °The clinic staff is available to answer your questions during regular business hours.  Please don’t hesitate to call and ask to speak to one of the nurses for clinical concerns.  If you have a medical emergency, go to the nearest emergency room or call 911.  A surgeon from [facility] is always on call at the hospital. ° °For further questions, please visit [email or web address]  ° °

## 2014-10-21 NOTE — Anesthesia Procedure Notes (Signed)
Procedure Name: LMA Insertion Date/Time: 10/21/2014 8:36 AM Performed by: Johnathan Hausen A Pre-anesthesia Checklist: Patient identified, Emergency Drugs available, Suction available, Patient being monitored and Timeout performed Patient Re-evaluated:Patient Re-evaluated prior to inductionOxygen Delivery Method: Circle system utilized Preoxygenation: Pre-oxygenation with 100% oxygen Intubation Type: Combination inhalational/ intravenous induction Ventilation: Mask ventilation without difficulty LMA: LMA with gastric port inserted LMA Size: 4.0 Number of attempts: 1 Tube secured with: Tape Dental Injury: Teeth and Oropharynx as per pre-operative assessment

## 2014-10-21 NOTE — Anesthesia Preprocedure Evaluation (Signed)
Anesthesia Evaluation  Patient identified by MRN, date of birth, ID band Patient awake    Reviewed: Allergy & Precautions, H&P , NPO status , Patient's Chart, lab work & pertinent test results  History of Anesthesia Complications Negative for: history of anesthetic complications  Airway Mallampati: II  TM Distance: >3 FB Neck ROM: Full    Dental  (+) Upper Dentures   Pulmonary sleep apnea ,  breath sounds clear to auscultation  Pulmonary exam normal       Cardiovascular hypertension, Pt. on medications - angina- Past MI and - CHF + dysrhythmias Atrial Fibrillation Rhythm:Regular     Neuro/Psych negative neurological ROS  negative psych ROS   GI/Hepatic GERD-  Medicated and Controlled,  Endo/Other  Morbid obesity  Renal/GU negative Renal ROS     Musculoskeletal  (+) Arthritis -,   Abdominal   Peds  Hematology  (+) anemia ,   Anesthesia Other Findings   Reproductive/Obstetrics                             Anesthesia Physical  Anesthesia Plan  ASA: III  Anesthesia Plan: General   Post-op Pain Management:    Induction: Intravenous  Airway Management Planned: LMA  Additional Equipment:   Intra-op Plan:   Post-operative Plan:   Informed Consent: I have reviewed the patients History and Physical, chart, labs and discussed the procedure including the risks, benefits and alternatives for the proposed anesthesia with the patient or authorized representative who has indicated his/her understanding and acceptance.   Dental advisory given  Plan Discussed with: CRNA and Surgeon  Anesthesia Plan Comments:         Anesthesia Quick Evaluation

## 2014-10-21 NOTE — Op Note (Signed)
PREOPERATIVE DIAGNOSIS:  Pancreatic cancer     POSTOPERATIVE DIAGNOSIS:  Same     PROCEDURE: Left subclavian port placement, Bard   Power Port, MRI safe, 8-French.      SURGEON:  [name], MD      ANESTHESIA:  General   FINDINGS:  Good venous return, easy flush, and tip of the catheter and   SVC 23.5 cm.      SPECIMEN:  None.      ESTIMATED BLOOD LOSS:  Minimal.      COMPLICATIONS:  None known.      PROCEDURE:  Pt was identified in the holding area and taken to   the operating room, where patient was placed supine on the operating room   table.  General anesthesia was induced.  Patient's arms were tucked and the upper   chest and neck were prepped and draped in sterile fashion.  Time-out was   performed according to the surgical safety check list.  When all was   correct, we continued.   Local anesthetic was administered over this   area at the angle of the clavicle.  The vein was accessed with 3 passes of the needle. There was good venous return and the wire passed easily with no ectopy.   Fluoroscopy was used to confirm that the wire was in the vena cava.      The patient was placed back level and the area for the pocket was anethetized   with local anesthetic.  A 3-cm transverse incision was made with a #15   blade.  Cautery was used to divide the subcutaneous tissues down to the   pectoralis muscle.  An [employer] retractor was used to elevate the skin   while a pocket was created on top of the pectoralis fascia.  The port   was placed into the pocket to confirm that it was of adequate size.  The   catheter was preattached to the port.  The port was then secured to the   pectoralis fascia with four 2-0 Prolene sutures.  These were clamped and   not tied down yet.    The catheter was tunneled through to the wire exit   site.  The catheter was placed along the wire to determine what length it should be to be in the SVC.  The catheter was cut at 23.5 cm.  The tunneler  sheath and dilator were passed over the wire and the dilator and wire were removed.  The catheter was advanced through the tunneler sheath and the tunneler sheath was pulled away.  Care was taken to keep the catheter in the tunneler sheath as this occurred. This was advanced and the tunneler sheath was removed.  There was good venous   return and easy flush of the catheter.  The Prolene sutures were tied   down to the pectoral fascia.  The skin was reapproximated using 3-0   Vicryl interrupted deep dermal sutures.    Fluoroscopy was used to re-confirm good position of the catheter.  The skin   was then closed using 4-0 Monocryl in a subcuticular fashion.  The port was flushed with concentrated heparin flush as well.  The wounds were then cleaned, dried, and dressed with Dermabond.  The patient was awakened from anesthesia and taken to the PACU in stable condition.  Needle, sponge, and instrument counts were correct.               [name], MD

## 2014-10-21 NOTE — Anesthesia Postprocedure Evaluation (Signed)
  Anesthesia Post-op Note  Patient: [name]  Procedure(s) Performed: Procedure(s) (LRB): INSERTION PORT-A-CATH (Left)  Patient Location: PACU  Anesthesia Type: General  Level of Consciousness: awake and alert   Airway and Oxygen Therapy: Patient Spontanous Breathing  Post-op Pain: mild  Post-op Assessment: Post-op Vital signs reviewed, Patient's Cardiovascular Status Stable, Respiratory Function Stable, Patent Airway and No signs of Nausea or vomiting  Last Vitals:  Filed Vitals:   10/21/14 1105  BP: 114/70  Pulse: 67  Temp: 36.4 C  Resp: 20    Post-op Vital Signs: stable   Complications: No apparent anesthesia complications
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
## 2014-10-21 NOTE — Transfer of Care (Signed)
Immediate Anesthesia Transfer of Care Note  Patient: [name]  Procedure(s) Performed: Procedure(s): INSERTION PORT-A-CATH (Left)  Patient Location: PACU  Anesthesia Type:General  Level of Consciousness: awake, sedated and patient cooperative  Airway & Oxygen Therapy: Patient Spontanous Breathing and Patient connected to face mask oxygen  Post-op Assessment: Report given to RN and Post -op Vital signs reviewed and stable  Post vital signs: Reviewed and stable  Last Vitals:  Filed Vitals:   10/21/14 0554  BP: 100/65  Pulse: 76  Temp: 37.1 C  Resp: 76    Complications: No apparent anesthesia complications

## 2014-10-23 ENCOUNTER — Other Ambulatory Visit: Payer: Self-pay | Admitting: Oncology

## 2014-10-24 ENCOUNTER — Encounter (HOSPITAL_COMMUNITY): Payer: Self-pay | Admitting: General Surgery

## 2014-10-26 ENCOUNTER — Ambulatory Visit: Payer: Medicare Other

## 2014-10-26 ENCOUNTER — Telehealth: Payer: Self-pay | Admitting: Nurse Practitioner

## 2014-10-26 ENCOUNTER — Other Ambulatory Visit (HOSPITAL_BASED_OUTPATIENT_CLINIC_OR_DEPARTMENT_OTHER): Payer: Medicare Other

## 2014-10-26 ENCOUNTER — Other Ambulatory Visit: Payer: Medicare Other

## 2014-10-26 ENCOUNTER — Ambulatory Visit (HOSPITAL_BASED_OUTPATIENT_CLINIC_OR_DEPARTMENT_OTHER): Payer: Medicare Other | Admitting: Nurse Practitioner

## 2014-10-26 ENCOUNTER — Ambulatory Visit (HOSPITAL_BASED_OUTPATIENT_CLINIC_OR_DEPARTMENT_OTHER): Payer: Medicare Other

## 2014-10-26 VITALS — BP 123/82 | HR 66 | Temp 98.1°F | Resp 18 | Ht 63.0 in | Wt 164.7 lb

## 2014-10-26 DIAGNOSIS — C250 Malignant neoplasm of head of pancreas: Secondary | ICD-10-CM

## 2014-10-26 DIAGNOSIS — Z95828 Presence of other vascular implants and grafts: Secondary | ICD-10-CM

## 2014-10-26 DIAGNOSIS — C541 Malignant neoplasm of endometrium: Secondary | ICD-10-CM

## 2014-10-26 DIAGNOSIS — Z809 Family history of malignant neoplasm, unspecified: Secondary | ICD-10-CM

## 2014-10-26 DIAGNOSIS — I2699 Other pulmonary embolism without acute cor pulmonale: Secondary | ICD-10-CM

## 2014-10-26 DIAGNOSIS — Z5111 Encounter for antineoplastic chemotherapy: Secondary | ICD-10-CM

## 2014-10-26 LAB — CBC WITH DIFFERENTIAL/PLATELET
BASO%: 0.7 % (ref 0.0–2.0)
BASOS ABS: 0 10*3/uL (ref 0.0–0.1)
EOS%: 4.6 % (ref 0.0–7.0)
Eosinophils Absolute: 0.2 10*3/uL (ref 0.0–0.5)
HCT: 31.3 % — ABNORMAL LOW (ref 34.8–46.6)
HEMOGLOBIN: 10.3 g/dL — AB (ref 11.6–15.9)
LYMPH%: 28.3 % (ref 14.0–49.7)
MCH: 28.4 pg (ref 25.1–34.0)
MCHC: 32.9 g/dL (ref 31.5–36.0)
MCV: 86.2 fL (ref 79.5–101.0)
MONO#: 0.4 10*3/uL (ref 0.1–0.9)
MONO%: 8.1 % (ref 0.0–14.0)
NEUT%: 58.3 % (ref 38.4–76.8)
NEUTROS ABS: 2.7 10*3/uL (ref 1.5–6.5)
PLATELETS: 268 10*3/uL (ref 145–400)
RBC: 3.63 10*6/uL — ABNORMAL LOW (ref 3.70–5.45)
RDW: 14 % (ref 11.2–14.5)
WBC: 4.6 10*3/uL (ref 3.9–10.3)
lymph#: 1.3 10*3/uL (ref 0.9–3.3)

## 2014-10-26 MED ORDER — PROCHLORPERAZINE MALEATE 10 MG PO TABS
10.0000 mg | ORAL_TABLET | Freq: Once | ORAL | Status: AC
  Administered 2014-10-26: 10 mg via ORAL

## 2014-10-26 MED ORDER — GEMCITABINE HCL CHEMO INJECTION 1 GM/26.3ML
1000.0000 mg/m2 | Freq: Once | INTRAVENOUS | Status: AC
  Administered 2014-10-26: 1862 mg via INTRAVENOUS
  Filled 2014-10-26: qty 49

## 2014-10-26 MED ORDER — SODIUM CHLORIDE 0.9 % IV SOLN
Freq: Once | INTRAVENOUS | Status: AC
  Administered 2014-10-26: 15:00:00 via INTRAVENOUS

## 2014-10-26 MED ORDER — SODIUM CHLORIDE 0.9 % IJ SOLN
10.0000 mL | INTRAMUSCULAR | Status: DC | PRN
  Administered 2014-10-26: 10 mL
  Filled 2014-10-26: qty 10

## 2014-10-26 MED ORDER — SODIUM CHLORIDE 0.9 % IJ SOLN
10.0000 mL | INTRAMUSCULAR | Status: DC | PRN
  Administered 2014-10-26: 10 mL via INTRAVENOUS
  Filled 2014-10-26: qty 10

## 2014-10-26 MED ORDER — HEPARIN SOD (PORK) LOCK FLUSH 100 UNIT/ML IV SOLN
500.0000 [IU] | Freq: Once | INTRAVENOUS | Status: AC | PRN
  Administered 2014-10-26: 500 [IU]
  Filled 2014-10-26: qty 5

## 2014-10-26 NOTE — Patient Instructions (Signed)

## 2014-10-26 NOTE — Progress Notes (Signed)
  [facility] OFFICE PROGRESS NOTE   Diagnosis:  Pancreas cancer  INTERVAL HISTORY:   [name] returns as scheduled. She underwent Port-A-Cath placement on 10/21/2014. She has noted significant improvement in the nausea today. No vomiting. Appetite is better. Wound is healing. No significant abdominal pain. She denies diarrhea but is having frequent bowel movements. She notes that the stools are a light yellow color.  Objective:  Vital signs in last 24 hours:  Blood pressure 123/82, pulse 66, temperature 98.1 F (36.7 C), temperature source Oral, resp. rate 18, height 5\' 3"  (1.6 m), weight 164 lb 11.2 oz (74.707 kg).    HEENT: No thrush or ulcers. Resp: Lungs clear bilaterally. Cardio: Regular rate and rhythm. GI: Abdomen soft and nontender. No hepatomegaly. Superficial opening with packing in place at the mid to upper portion of the midline incision. Wound edges appear clean. Vascular: No leg edema. Calves soft and nontender.  Skin: No rash. Port-A-Cath without erythema.    Lab Results:  Lab Results  Component Value Date   WBC 4.6 10/26/2014   HGB 10.3* 10/26/2014   HCT 31.3* 10/26/2014   MCV 86.2 10/26/2014   PLT 268 10/26/2014   NEUTROABS 2.7 10/26/2014    Imaging:  No results found.  Medications: I have reviewed the patient's current medications.  Assessment/Plan: 1. 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 Admission with post ERCP pancreatitis 06/16/2014  4. History of Abdominal pain secondary to #1  5. Pulmonary embolism diagnosed on a CT of the  abdomen 09/16/2014  Negative lower extremity Dopplers 09/17/2014  6. Multiple orthopedic surgical procedures  7. Endometrial cancer,stage IA, grade 1 endometrioid adenocarcinoma, 18% myometrial invasion, no lymphovascular space involvement, negative washings  Status post robotic total hysterectomy and bilateral salpingo-oophorectomy 11/30/2010  8. History of atrial fibrillation-maintained on xarelto  9. Family history of multiple cancers-negative CancerNext gene panel  10. Prolonged nausea following the pancreaticoduodenectomy. Improved 10/26/2014.  11. Port-A-Cath placement 10/21/2014.   Disposition: [name] appears stable. The nausea is better. The wound is nearly healed. Plan to proceed with adjuvant gemcitabine today as scheduled. We again reviewed potential toxicities associated with gemcitabine. She is agreeable to proceed. We will see her in follow-up prior to treatment in one week. She will contact the office in the interim with any problems.  Plan reviewed with Dr. [name].    Ned Card ANP/GNP-BC   10/26/2014  2:06 PM

## 2014-10-26 NOTE — Patient Instructions (Addendum)
[facility] Discharge Instructions for Patients Receiving Chemotherapy  Today you received the following chemotherapy agents Gemcitabine.   To help prevent nausea and vomiting after your treatment, we encourage you to take your nausea medication as directed.    If you develop nausea and vomiting that is not controlled by your nausea medication, call the clinic.   BELOW ARE SYMPTOMS THAT SHOULD BE REPORTED IMMEDIATELY:  *FEVER GREATER THAN 100.5 F  *CHILLS WITH OR WITHOUT FEVER  NAUSEA AND VOMITING THAT IS NOT CONTROLLED WITH YOUR NAUSEA MEDICATION  *UNUSUAL SHORTNESS OF BREATH  *UNUSUAL BRUISING OR BLEEDING  TENDERNESS IN MOUTH AND THROAT WITH OR WITHOUT PRESENCE OF ULCERS  *URINARY PROBLEMS  *BOWEL PROBLEMS  UNUSUAL RASH Items with * indicate a potential emergency and should be followed up as soon as possible.  Feel free to call the clinic you have any questions or concerns. The clinic phone number is [phone].  Gemcitabine injection What is this medicine? GEMCITABINE (jem SIT a been) is a chemotherapy drug. This medicine is used to treat many types of cancer like breast cancer, lung cancer, pancreatic cancer, and ovarian cancer. This medicine may be used for other purposes; ask your health care provider or pharmacist if you have questions. COMMON BRAND NAME(S): Gemzar What should I tell my health care provider before I take this medicine? They need to know if you have any of these conditions: -blood disorders -infection -kidney disease -liver disease -recent or ongoing radiation therapy -an unusual or allergic reaction to gemcitabine, other chemotherapy, other medicines, foods, dyes, or preservatives -pregnant or trying to get pregnant -breast-feeding How should I use this medicine? This drug is given as an infusion into a vein. It is administered in a hospital or clinic by a specially trained health care professional. Talk to your pediatrician  regarding the use of this medicine in children. Special care may be needed. Overdosage: If you think you have taken too much of this medicine contact a poison control center or emergency room at once. NOTE: This medicine is only for you. Do not share this medicine with others. What if I miss a dose? It is important not to miss your dose. Call your doctor or health care professional if you are unable to keep an appointment. What may interact with this medicine? -medicines to increase blood counts like filgrastim, pegfilgrastim, sargramostim -some other chemotherapy drugs like cisplatin -vaccines Talk to your doctor or health care professional before taking any of these medicines: -acetaminophen -aspirin -ibuprofen -ketoprofen -naproxen This list may not describe all possible interactions. Give your health care provider a list of all the medicines, herbs, non-prescription drugs, or dietary supplements you use. Also tell them if you smoke, drink alcohol, or use illegal drugs. Some items may interact with your medicine. What should I watch for while using this medicine? Visit your doctor for checks on your progress. This drug may make you feel generally unwell. This is not uncommon, as chemotherapy can affect healthy cells as well as cancer cells. Report any side effects. Continue your course of treatment even though you feel ill unless your doctor tells you to stop. In some cases, you may be given additional medicines to help with side effects. Follow all directions for their use. Call your doctor or health care professional for advice if you get a fever, chills or sore throat, or other symptoms of a cold or flu. Do not treat yourself. This drug decreases your body's ability to fight infections. Try   to avoid being around people who are sick. This medicine may increase your risk to bruise or bleed. Call your doctor or health care professional if you notice any unusual bleeding. Be careful brushing  and flossing your teeth or using a toothpick because you may get an infection or bleed more easily. If you have any dental work done, tell your dentist you are receiving this medicine. Avoid taking products that contain aspirin, acetaminophen, ibuprofen, naproxen, or ketoprofen unless instructed by your doctor. These medicines may hide a fever. Women should inform their doctor if they wish to become pregnant or think they might be pregnant. There is a potential for serious side effects to an unborn child. Talk to your health care professional or pharmacist for more information. Do not breast-feed an infant while taking this medicine. What side effects may I notice from receiving this medicine? Side effects that you should report to your doctor or health care professional as soon as possible: -allergic reactions like skin rash, itching or hives, swelling of the face, lips, or tongue -low blood counts - this medicine may decrease the number of white blood cells, red blood cells and platelets. You may be at increased risk for infections and bleeding. -signs of infection - fever or chills, cough, sore throat, pain or difficulty passing urine -signs of decreased platelets or bleeding - bruising, pinpoint red spots on the skin, black, tarry stools, blood in the urine -signs of decreased red blood cells - unusually weak or tired, fainting spells, lightheadedness -breathing problems -chest pain -mouth sores -nausea and vomiting -pain, swelling, redness at site where injected -pain, tingling, numbness in the hands or feet -stomach pain -swelling of ankles, feet, hands -unusual bleeding Side effects that usually do not require medical attention (report to your doctor or health care professional if they continue or are bothersome): -constipation -diarrhea -hair loss -loss of appetite -stomach upset This list may not describe all possible side effects. Call your doctor for medical advice about side  effects. You may report side effects to FDA at 1-800-FDA-1088. Where should I keep my medicine? This drug is given in a hospital or clinic and will not be stored at home. NOTE: This sheet is a summary. It may not cover all possible information. If you have questions about this medicine, talk to your doctor, pharmacist, or health care provider.  2015, Elsevier/Gold Standard. (2008-01-19 18:45:54)     

## 2014-10-26 NOTE — Telephone Encounter (Signed)
Pt confirmed labs/ov per 02/03 POF,  gave pt AVS..... KJ, chemo already added

## 2014-10-27 ENCOUNTER — Telehealth: Payer: Self-pay | Admitting: Certified Registered Nurse Anesthetist

## 2014-10-27 ENCOUNTER — Other Ambulatory Visit: Payer: Self-pay | Admitting: Cardiology

## 2014-10-27 NOTE — Telephone Encounter (Signed)
-----   Message from Cathlean Cower, RN sent at 10/26/2014  2:48 PM EST ----- Regarding: chemo f/u 1st time Gemzar on 2/3.  Dr. [name].

## 2014-10-27 NOTE — Telephone Encounter (Signed)
Pt. received chemo yesterday.  Doing well with some mild nausea. Denied vomiting. Able to eat and drink.  Denied any diarrhea or constipation. Antiemetic reviewed with patient.  Pt. denied any other issues and has no further questions.  [facility] phone number reviewed and encouaraged pt. to call if she has further problems or questions. HL

## 2014-10-30 ENCOUNTER — Other Ambulatory Visit: Payer: Self-pay | Admitting: Oncology

## 2014-11-02 ENCOUNTER — Other Ambulatory Visit: Payer: Self-pay | Admitting: Nurse Practitioner

## 2014-11-02 ENCOUNTER — Ambulatory Visit: Payer: Medicare Other

## 2014-11-02 ENCOUNTER — Telehealth: Payer: Self-pay | Admitting: *Deleted

## 2014-11-02 ENCOUNTER — Ambulatory Visit (HOSPITAL_BASED_OUTPATIENT_CLINIC_OR_DEPARTMENT_OTHER): Payer: Medicare Other | Admitting: Nurse Practitioner

## 2014-11-02 ENCOUNTER — Telehealth: Payer: Self-pay | Admitting: Nurse Practitioner

## 2014-11-02 ENCOUNTER — Other Ambulatory Visit (HOSPITAL_BASED_OUTPATIENT_CLINIC_OR_DEPARTMENT_OTHER): Payer: Medicare Other

## 2014-11-02 VITALS — BP 116/78 | HR 60 | Temp 97.8°F | Resp 18 | Ht 63.0 in | Wt 163.8 lb

## 2014-11-02 DIAGNOSIS — I4891 Unspecified atrial fibrillation: Secondary | ICD-10-CM

## 2014-11-02 DIAGNOSIS — C250 Malignant neoplasm of head of pancreas: Secondary | ICD-10-CM

## 2014-11-02 DIAGNOSIS — D702 Other drug-induced agranulocytosis: Secondary | ICD-10-CM

## 2014-11-02 DIAGNOSIS — I2699 Other pulmonary embolism without acute cor pulmonale: Secondary | ICD-10-CM

## 2014-11-02 DIAGNOSIS — Z95828 Presence of other vascular implants and grafts: Secondary | ICD-10-CM

## 2014-11-02 LAB — CBC WITH DIFFERENTIAL/PLATELET
BASO%: 0.8 % (ref 0.0–2.0)
Basophils Absolute: 0 10*3/uL (ref 0.0–0.1)
EOS ABS: 0 10*3/uL (ref 0.0–0.5)
EOS%: 1.1 % (ref 0.0–7.0)
HEMATOCRIT: 29.7 % — AB (ref 34.8–46.6)
HGB: 9.9 g/dL — ABNORMAL LOW (ref 11.6–15.9)
LYMPH#: 1.5 10*3/uL (ref 0.9–3.3)
LYMPH%: 58.2 % — AB (ref 14.0–49.7)
MCH: 28.5 pg (ref 25.1–34.0)
MCHC: 33.3 g/dL (ref 31.5–36.0)
MCV: 85.6 fL (ref 79.5–101.0)
MONO#: 0.3 10*3/uL (ref 0.1–0.9)
MONO%: 10 % (ref 0.0–14.0)
NEUT%: 29.9 % — ABNORMAL LOW (ref 38.4–76.8)
NEUTROS ABS: 0.8 10*3/uL — AB (ref 1.5–6.5)
Platelets: 169 10*3/uL (ref 145–400)
RBC: 3.47 10*6/uL — AB (ref 3.70–5.45)
RDW: 13.9 % (ref 11.2–14.5)
WBC: 2.6 10*3/uL — AB (ref 3.9–10.3)
nRBC: 0 % (ref 0–0)

## 2014-11-02 LAB — COMPREHENSIVE METABOLIC PANEL (CC13)
ALBUMIN: 2.9 g/dL — AB (ref 3.5–5.0)
ALT: 118 U/L — ABNORMAL HIGH (ref 0–55)
ANION GAP: 9 meq/L (ref 3–11)
AST: 135 U/L — AB (ref 5–34)
Alkaline Phosphatase: 140 U/L (ref 40–150)
BUN: 8.6 mg/dL (ref 7.0–26.0)
CALCIUM: 8.4 mg/dL (ref 8.4–10.4)
CHLORIDE: 101 meq/L (ref 98–109)
CO2: 23 meq/L (ref 22–29)
CREATININE: 0.7 mg/dL (ref 0.6–1.1)
EGFR: >90 mL/min/{1.73_m2} (ref >90)
Glucose: 123 mg/dl (ref 70–140)
POTASSIUM: 4.1 meq/L (ref 3.5–5.1)
Sodium: 133 mEq/L — ABNORMAL LOW (ref 136–145)
Total Bilirubin: 0.31 mg/dL (ref 0.20–1.20)
Total Protein: 6.3 g/dL — ABNORMAL LOW (ref 6.4–8.3)

## 2014-11-02 MED ORDER — HEPARIN SOD (PORK) LOCK FLUSH 100 UNIT/ML IV SOLN
500.0000 [IU] | Freq: Once | INTRAVENOUS | Status: AC
  Administered 2014-11-02: 500 [IU] via INTRAVENOUS
  Filled 2014-11-02: qty 5

## 2014-11-02 MED ORDER — ONDANSETRON HCL 4 MG PO TABS: 4.0000 mg | ORAL_TABLET | Freq: Four times a day (QID) | ORAL | Status: DC | PRN

## 2014-11-02 MED ORDER — SODIUM CHLORIDE 0.9 % IJ SOLN
10.0000 mL | INTRAMUSCULAR | Status: DC | PRN
  Administered 2014-11-02: 10 mL via INTRAVENOUS
  Filled 2014-11-02: qty 10

## 2014-11-02 NOTE — Progress Notes (Addendum)
  [place] OFFICE PROGRESS NOTE   Diagnosis:  Pancreas cancer  INTERVAL HISTORY:   Ms. Meisenheimer returns as scheduled. She began adjuvant gemcitabine on 10/26/2014. No change in baseline mild nausea. She takes Zofran as needed. No mouth sores. No diarrhea. She reports her stools are now normal in color. She denies fever. No shortness of breath or cough. No rash.  Objective:  Vital signs in last 24 hours:  Blood pressure 116/78, pulse 60, temperature 97.8 F (36.6 C), temperature source Oral, resp. rate 18, height 5\' 3"  (1.6 m), weight 163 lb 12.8 oz (74.299 kg), SpO2 99 %.    HEENT: No thrush or ulcers. Resp: Lungs clear bilaterally. Cardio: Regular rate and rhythm. GI: Abdomen soft and nontender. No hepatomegaly. Midline wound has healed. Vascular: No leg edema. Port-A-Cath without erythema.    Lab Results:  Lab Results  Component Value Date   WBC 2.6* 11/02/2014   HGB 9.9* 11/02/2014   HCT 29.7* 11/02/2014   MCV 85.6 11/02/2014   PLT 169 11/02/2014   NEUTROABS 0.8* 11/02/2014    Imaging:  No results found.  Medications: I have reviewed the patient's current medications.  Assessment/Plan: 1. 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  2. 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 Admission with post ERCP pancreatitis 06/16/2014  4. History of Abdominal pain secondary to #1  5. Pulmonary embolism diagnosed on a CT of the abdomen 09/16/2014  Negative lower extremity Dopplers 09/17/2014  6. Multiple orthopedic surgical procedures  7. Endometrial  cancer,stage IA, grade 1 endometrioid adenocarcinoma, 18% myometrial invasion, no lymphovascular space involvement, negative washings  Status post robotic total hysterectomy and bilateral salpingo-oophorectomy 11/30/2010  8. History of atrial fibrillation-maintained on xarelto  9. Family history of multiple cancers-negative CancerNext gene panel  10. Prolonged nausea following the pancreaticoduodenectomy. Improved 10/26/2014.  11. Port-A-Cath placement 10/21/2014.  12. Neutropenia secondary to chemotherapy 11/02/2014.   Disposition: Ms. Peffley appears stable. She tolerated the first treatment with gemcitabine well. She is neutropenic on labs today. We will hold today's treatment and reschedule for one week. The gemcitabine will be dose reduced. We will see her in follow-up prior to treatment in 2 weeks. She will contact the office in the interim with any problems. We discussed fever, chills, other signs of infection.  Patient seen with Dr. [name].  Ned Card ANP/GNP-BC   11/02/2014  1:03 PM  This was a shared visit with Ned Card. The gemcitabine will be dose reduced  Beginning with chemotherapy next week.  Julieanne Manson, MD

## 2014-11-02 NOTE — Telephone Encounter (Signed)
Per staff message and POF I have scheduled appts. Advised scheduler of appts. JMW  

## 2014-11-02 NOTE — Telephone Encounter (Signed)
Gave avs & calendar for February. °

## 2014-11-02 NOTE — Patient Instructions (Signed)

## 2014-11-06 ENCOUNTER — Other Ambulatory Visit: Payer: Self-pay | Admitting: Oncology

## 2014-11-07 ENCOUNTER — Ambulatory Visit: Payer: Medicare Other | Admitting: Physician Assistant

## 2014-11-09 ENCOUNTER — Other Ambulatory Visit (HOSPITAL_BASED_OUTPATIENT_CLINIC_OR_DEPARTMENT_OTHER): Payer: Medicare Other

## 2014-11-09 ENCOUNTER — Ambulatory Visit: Payer: Medicare Other | Admitting: Nutrition

## 2014-11-09 ENCOUNTER — Ambulatory Visit (HOSPITAL_BASED_OUTPATIENT_CLINIC_OR_DEPARTMENT_OTHER): Payer: Medicare Other

## 2014-11-09 ENCOUNTER — Ambulatory Visit: Payer: Medicare Other

## 2014-11-09 DIAGNOSIS — Z5111 Encounter for antineoplastic chemotherapy: Secondary | ICD-10-CM

## 2014-11-09 DIAGNOSIS — C250 Malignant neoplasm of head of pancreas: Secondary | ICD-10-CM

## 2014-11-09 DIAGNOSIS — Z95828 Presence of other vascular implants and grafts: Secondary | ICD-10-CM

## 2014-11-09 LAB — COMPREHENSIVE METABOLIC PANEL (CC13)
ALBUMIN: 3.2 g/dL — AB (ref 3.5–5.0)
ALT: 103 U/L — AB (ref 0–55)
AST: 108 U/L — ABNORMAL HIGH (ref 5–34)
Alkaline Phosphatase: 121 U/L (ref 40–150)
Anion Gap: 9 mEq/L (ref 3–11)
BUN: 14.3 mg/dL (ref 7.0–26.0)
CALCIUM: 8.6 mg/dL (ref 8.4–10.4)
CO2: 24 mEq/L (ref 22–29)
Chloride: 101 mEq/L (ref 98–109)
Creatinine: 0.7 mg/dL (ref 0.6–1.1)
EGFR: 85 mL/min/{1.73_m2} — ABNORMAL LOW (ref >90)
GLUCOSE: 121 mg/dL (ref 70–140)
Potassium: 4.1 mEq/L (ref 3.5–5.1)
SODIUM: 134 meq/L — AB (ref 136–145)
Total Bilirubin: 0.35 mg/dL (ref 0.20–1.20)
Total Protein: 6.1 g/dL — ABNORMAL LOW (ref 6.4–8.3)

## 2014-11-09 LAB — CBC WITH DIFFERENTIAL/PLATELET
BASO%: 0.5 % (ref 0.0–2.0)
Basophils Absolute: 0 10*3/uL (ref 0.0–0.1)
EOS ABS: 0.1 10*3/uL (ref 0.0–0.5)
EOS%: 3.3 % (ref 0.0–7.0)
HCT: 32.3 % — ABNORMAL LOW (ref 34.8–46.6)
HGB: 10.3 g/dL — ABNORMAL LOW (ref 11.6–15.9)
LYMPH%: 34.6 % (ref 14.0–49.7)
MCH: 27.8 pg (ref 25.1–34.0)
MCHC: 31.8 g/dL (ref 31.5–36.0)
MCV: 87.3 fL (ref 79.5–101.0)
MONO#: 0.4 10*3/uL (ref 0.1–0.9)
MONO%: 9.7 % (ref 0.0–14.0)
NEUT%: 51.9 % (ref 38.4–76.8)
NEUTROS ABS: 2 10*3/uL (ref 1.5–6.5)
PLATELETS: 254 10*3/uL (ref 145–400)
RBC: 3.7 10*6/uL (ref 3.70–5.45)
RDW: 14.8 % — ABNORMAL HIGH (ref 11.2–14.5)
WBC: 3.8 10*3/uL — ABNORMAL LOW (ref 3.9–10.3)
lymph#: 1.3 10*3/uL (ref 0.9–3.3)

## 2014-11-09 MED ORDER — SODIUM CHLORIDE 0.9 % IJ SOLN
10.0000 mL | INTRAMUSCULAR | Status: DC | PRN
  Administered 2014-11-09: 10 mL
  Filled 2014-11-09: qty 10

## 2014-11-09 MED ORDER — GEMCITABINE HCL CHEMO INJECTION 1 GM/26.3ML
800.0000 mg/m2 | Freq: Once | INTRAVENOUS | Status: AC
  Administered 2014-11-09: 1482 mg via INTRAVENOUS
  Filled 2014-11-09: qty 38.98

## 2014-11-09 MED ORDER — PROCHLORPERAZINE MALEATE 10 MG PO TABS
ORAL_TABLET | ORAL | Status: AC
  Filled 2014-11-09: qty 1

## 2014-11-09 MED ORDER — SODIUM CHLORIDE 0.9 % IJ SOLN
10.0000 mL | INTRAMUSCULAR | Status: DC | PRN
  Administered 2014-11-09: 10 mL via INTRAVENOUS
  Filled 2014-11-09: qty 10

## 2014-11-09 MED ORDER — PROCHLORPERAZINE MALEATE 10 MG PO TABS
10.0000 mg | ORAL_TABLET | Freq: Once | ORAL | Status: AC
  Administered 2014-11-09: 10 mg via ORAL

## 2014-11-09 MED ORDER — HEPARIN SOD (PORK) LOCK FLUSH 100 UNIT/ML IV SOLN
500.0000 [IU] | Freq: Once | INTRAVENOUS | Status: AC | PRN
  Administered 2014-11-09: 500 [IU]
  Filled 2014-11-09: qty 5

## 2014-11-09 MED ORDER — SODIUM CHLORIDE 0.9 % IV SOLN
Freq: Once | INTRAVENOUS | Status: AC
  Administered 2014-11-09: 13:00:00 via INTRAVENOUS

## 2014-11-09 NOTE — Patient Instructions (Signed)

## 2014-11-09 NOTE — Patient Instructions (Signed)
[facility] Discharge Instructions for Patients Receiving Chemotherapy  Today you received the following chemotherapy agents Gemzar.  To help prevent nausea and vomiting after your treatment, we encourage you to take your nausea medication.   If you develop nausea and vomiting that is not controlled by your nausea medication, call the clinic.   BELOW ARE SYMPTOMS THAT SHOULD BE REPORTED IMMEDIATELY:  *FEVER GREATER THAN 100.5 F  *CHILLS WITH OR WITHOUT FEVER  NAUSEA AND VOMITING THAT IS NOT CONTROLLED WITH YOUR NAUSEA MEDICATION  *UNUSUAL SHORTNESS OF BREATH  *UNUSUAL BRUISING OR BLEEDING  TENDERNESS IN MOUTH AND THROAT WITH OR WITHOUT PRESENCE OF ULCERS  *URINARY PROBLEMS  *BOWEL PROBLEMS  UNUSUAL RASH Items with * indicate a potential emergency and should be followed up as soon as possible.  Feel free to call the clinic you have any questions or concerns. The clinic phone number is [phone].    

## 2014-11-09 NOTE — Progress Notes (Signed)
Dr. [name] has reviewed labs. OK to proceed with treatment.

## 2014-11-09 NOTE — Progress Notes (Signed)
Nutrition follow-up completed with patient.   Patient is status post Whipple surgery on December 8.   She was discharged from the hospital on December 19 and then readmitted December 24 for delayed gastric emptying and PE.   Patient was admitted for abdominal wound infection on January 23.   Per M.D., patient is healing.  She is receiving chemotherapy today.   Patient consumes one boost plus a day.  She takes a multivitamin and vitamin C. Weight documented as 163.8 pounds February 10 decreased from 186.3 pounds November 17. Patient complains of increased gas.  Nutrition diagnosis: Nutrition related knowledge deficit continues.  Intervention:  Educated patient and husband to continue high-calorie, high-protein meals and snacks. Recommended patient increase boost plus twice a day between meals.  Continue multivitamin and vitamin C for improved healing. Educated patient on strategies for decreasing gas.  Provided fact sheets.  Monitoring, evaluation, goals: Patient will work to increase calories and protein to minimize weight loss and promote continued healing.  Next visit: Will be scheduled as needed.  **Disclaimer: This note was dictated with voice recognition software. Similar sounding words can inadvertently be transcribed and this note may contain transcription errors which may not have been corrected upon publication of note.**

## 2014-11-12 ENCOUNTER — Other Ambulatory Visit: Payer: Self-pay | Admitting: Oncology

## 2014-11-16 ENCOUNTER — Telehealth: Payer: Self-pay | Admitting: Nurse Practitioner

## 2014-11-16 ENCOUNTER — Ambulatory Visit (HOSPITAL_BASED_OUTPATIENT_CLINIC_OR_DEPARTMENT_OTHER): Payer: Medicare Other | Admitting: Nurse Practitioner

## 2014-11-16 ENCOUNTER — Telehealth: Payer: Self-pay | Admitting: *Deleted

## 2014-11-16 ENCOUNTER — Ambulatory Visit: Payer: Medicare Other

## 2014-11-16 ENCOUNTER — Other Ambulatory Visit (HOSPITAL_BASED_OUTPATIENT_CLINIC_OR_DEPARTMENT_OTHER): Payer: Medicare Other

## 2014-11-16 VITALS — BP 134/77 | HR 60 | Temp 97.7°F

## 2014-11-16 VITALS — Resp 18 | Wt 159.5 lb

## 2014-11-16 DIAGNOSIS — C541 Malignant neoplasm of endometrium: Secondary | ICD-10-CM

## 2014-11-16 DIAGNOSIS — D701 Agranulocytosis secondary to cancer chemotherapy: Secondary | ICD-10-CM

## 2014-11-16 DIAGNOSIS — N939 Abnormal uterine and vaginal bleeding, unspecified: Secondary | ICD-10-CM

## 2014-11-16 DIAGNOSIS — K831 Obstruction of bile duct: Secondary | ICD-10-CM

## 2014-11-16 DIAGNOSIS — Z809 Family history of malignant neoplasm, unspecified: Secondary | ICD-10-CM

## 2014-11-16 DIAGNOSIS — I2699 Other pulmonary embolism without acute cor pulmonale: Secondary | ICD-10-CM

## 2014-11-16 DIAGNOSIS — I4891 Unspecified atrial fibrillation: Secondary | ICD-10-CM

## 2014-11-16 DIAGNOSIS — G893 Neoplasm related pain (acute) (chronic): Secondary | ICD-10-CM

## 2014-11-16 DIAGNOSIS — Z95828 Presence of other vascular implants and grafts: Secondary | ICD-10-CM

## 2014-11-16 DIAGNOSIS — C250 Malignant neoplasm of head of pancreas: Secondary | ICD-10-CM

## 2014-11-16 DIAGNOSIS — R110 Nausea: Secondary | ICD-10-CM

## 2014-11-16 LAB — COMPREHENSIVE METABOLIC PANEL (CC13)
ALK PHOS: 130 U/L (ref 40–150)
ALT: 188 U/L — AB (ref 0–55)
AST: 195 U/L (ref 5–34)
Albumin: 3.2 g/dL — ABNORMAL LOW (ref 3.5–5.0)
Anion Gap: 9 mEq/L (ref 3–11)
BUN: 12.1 mg/dL (ref 7.0–26.0)
CALCIUM: 8.6 mg/dL (ref 8.4–10.4)
CHLORIDE: 104 meq/L (ref 98–109)
CO2: 23 mEq/L (ref 22–29)
Creatinine: 0.7 mg/dL (ref 0.6–1.1)
EGFR: >90 mL/min/{1.73_m2} (ref >90)
Glucose: 110 mg/dl (ref 70–140)
POTASSIUM: 4.1 meq/L (ref 3.5–5.1)
SODIUM: 136 meq/L (ref 136–145)
TOTAL PROTEIN: 6.2 g/dL — AB (ref 6.4–8.3)
Total Bilirubin: 0.35 mg/dL (ref 0.20–1.20)

## 2014-11-16 LAB — CBC WITH DIFFERENTIAL/PLATELET
BASO%: 1 % (ref 0.0–2.0)
Basophils Absolute: 0 10*3/uL (ref 0.0–0.1)
EOS%: 1 % (ref 0.0–7.0)
Eosinophils Absolute: 0 10*3/uL (ref 0.0–0.5)
HCT: 29.9 % — ABNORMAL LOW (ref 34.8–46.6)
HGB: 10.1 g/dL — ABNORMAL LOW (ref 11.6–15.9)
LYMPH#: 1.3 10*3/uL (ref 0.9–3.3)
LYMPH%: 62.4 % — AB (ref 14.0–49.7)
MCH: 28.7 pg (ref 25.1–34.0)
MCHC: 33.8 g/dL (ref 31.5–36.0)
MCV: 84.9 fL (ref 79.5–101.0)
MONO#: 0.2 10*3/uL (ref 0.1–0.9)
MONO%: 8.3 % (ref 0.0–14.0)
NEUT#: 0.6 10*3/uL — ABNORMAL LOW (ref 1.5–6.5)
NEUT%: 27.3 % — ABNORMAL LOW (ref 38.4–76.8)
Platelets: 270 10*3/uL (ref 145–400)
RBC: 3.52 10*6/uL — ABNORMAL LOW (ref 3.70–5.45)
RDW: 14.8 % — AB (ref 11.2–14.5)
WBC: 2.1 10*3/uL — AB (ref 3.9–10.3)

## 2014-11-16 MED ORDER — HEPARIN SOD (PORK) LOCK FLUSH 100 UNIT/ML IV SOLN
500.0000 [IU] | Freq: Once | INTRAVENOUS | Status: AC
  Administered 2014-11-16: 500 [IU] via INTRAVENOUS
  Filled 2014-11-16: qty 5

## 2014-11-16 MED ORDER — SODIUM CHLORIDE 0.9 % IJ SOLN
10.0000 mL | INTRAMUSCULAR | Status: DC | PRN
  Administered 2014-11-16: 10 mL via INTRAVENOUS
  Filled 2014-11-16: qty 10

## 2014-11-16 NOTE — Progress Notes (Signed)
  [facility] OFFICE PROGRESS NOTE   Diagnosis:  Pancreas cancer  INTERVAL HISTORY:   [name] returns as scheduled. She completed the second treatment with gemcitabine on 11/09/2014. The gemcitabine was dose reduced due to neutropenia the week prior. No change in baseline nausea. She vomits periodically. Overall good appetite. No diarrhea. No fever. No shortness of breath or cough. No skin rash. She reports intermittent vaginal bleeding since June 2015. She has an appointment with Dr. Alycia Rossetti next week.  Objective:  Vital signs in last 24 hours:  Resp. rate 18, weight 159 lb 8 oz (72.349 kg), SpO2 100 %.    HEENT: No thrush or ulcers. Resp: Lungs clear bilaterally. Cardio: Regular rate and rhythm. GI: Abdomen soft and nontender. No hepatomegaly. Vascular: No leg edema. Skin: No rash. Port-A-Cath without erythema.    Lab Results:  Lab Results  Component Value Date   WBC 2.1* 11/16/2014   HGB 10.1* 11/16/2014   HCT 29.9* 11/16/2014   MCV 84.9 11/16/2014   PLT 270 11/16/2014   NEUTROABS 0.6* 11/16/2014    Imaging:  No results found.  Medications: I have reviewed the patient's current medications.  Assessment/Plan: 1. 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  Gemcitabine held 11/02/2014 due to neutropenia.  Gemcitabine 11/09/2014 dose reduced 800 mg/m.  Gemcitabine held 11/16/2014 due to neutropenia.  2. 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 Admission with post ERCP pancreatitis 06/16/2014  4. History of Abdominal pain secondary to #1  5.  Pulmonary embolism diagnosed on a CT of the abdomen 09/16/2014  Negative lower extremity Dopplers 09/17/2014  6. Multiple orthopedic surgical procedures  7. Endometrial cancer,stage IA, grade 1 endometrioid adenocarcinoma, 18% myometrial invasion, no lymphovascular space involvement, negative washings  Status post robotic total hysterectomy and bilateral salpingo-oophorectomy 11/30/2010  8. History of atrial fibrillation-maintained on xarelto  9. Family history of multiple cancers-negative CancerNext gene panel  10. Prolonged nausea following the pancreaticoduodenectomy. Improved 10/26/2014.  11. Port-A-Cath placement 10/21/2014.  12. Neutropenia secondary to chemotherapy 11/02/2014, 11/16/2014.   Disposition: [name] appears stable. She has completed 2 treatments with gemcitabine. The gemcitabine was reduced with the second treatment due to previous neutropenia. She is neutropenic again on labs today. We are holding gemcitabine today. The treatment schedule will be changed to every 2 weeks beginning 11/23/2014. She understands to contact the office with fever, chills, other signs of infection. We will see her in follow-up on 12/07/2014.  She will follow-up with Dr. Alycia Rossetti regarding the vaginal bleeding.  Plan reviewed with Dr. [name].    Ned Card ANP/GNP-BC   11/16/2014  11:57 AM

## 2014-11-16 NOTE — Telephone Encounter (Signed)
Pt confirmed labs/ov per 02/24 POF, gave pt AVS.... KJ, sent msg to add chemo °

## 2014-11-16 NOTE — Patient Instructions (Signed)

## 2014-11-16 NOTE — Telephone Encounter (Signed)
Per staff message and POF I have scheduled appts. Advised scheduler of appts. JMW  

## 2014-11-20 ENCOUNTER — Other Ambulatory Visit: Payer: Self-pay | Admitting: Oncology

## 2014-11-21 ENCOUNTER — Other Ambulatory Visit: Payer: Self-pay | Admitting: Cardiology

## 2014-11-23 ENCOUNTER — Telehealth: Payer: Self-pay | Admitting: Nurse Practitioner

## 2014-11-23 ENCOUNTER — Other Ambulatory Visit (HOSPITAL_BASED_OUTPATIENT_CLINIC_OR_DEPARTMENT_OTHER): Payer: Medicare Other

## 2014-11-23 ENCOUNTER — Ambulatory Visit (HOSPITAL_BASED_OUTPATIENT_CLINIC_OR_DEPARTMENT_OTHER): Payer: Medicare Other

## 2014-11-23 DIAGNOSIS — C250 Malignant neoplasm of head of pancreas: Secondary | ICD-10-CM

## 2014-11-23 DIAGNOSIS — Z5111 Encounter for antineoplastic chemotherapy: Secondary | ICD-10-CM

## 2014-11-23 LAB — COMPREHENSIVE METABOLIC PANEL (CC13)
ALT: 99 U/L — ABNORMAL HIGH (ref 0–55)
AST: 88 U/L — AB (ref 5–34)
Albumin: 3.2 g/dL — ABNORMAL LOW (ref 3.5–5.0)
Alkaline Phosphatase: 126 U/L (ref 40–150)
Anion Gap: 8 mEq/L (ref 3–11)
BUN: 8.9 mg/dL (ref 7.0–26.0)
CALCIUM: 8.8 mg/dL (ref 8.4–10.4)
CO2: 24 mEq/L (ref 22–29)
Chloride: 104 mEq/L (ref 98–109)
Creatinine: 0.7 mg/dL (ref 0.6–1.1)
EGFR: 88 mL/min/{1.73_m2} — ABNORMAL LOW (ref >90)
GLUCOSE: 136 mg/dL (ref 70–140)
POTASSIUM: 4.5 meq/L (ref 3.5–5.1)
Sodium: 137 mEq/L (ref 136–145)
TOTAL PROTEIN: 6 g/dL — AB (ref 6.4–8.3)
Total Bilirubin: 0.42 mg/dL (ref 0.20–1.20)

## 2014-11-23 LAB — CBC WITH DIFFERENTIAL/PLATELET
BASO%: 0.7 % (ref 0.0–2.0)
Basophils Absolute: 0 10*3/uL (ref 0.0–0.1)
EOS%: 3.6 % (ref 0.0–7.0)
Eosinophils Absolute: 0.1 10*3/uL (ref 0.0–0.5)
HCT: 33.2 % — ABNORMAL LOW (ref 34.8–46.6)
HEMOGLOBIN: 10.6 g/dL — AB (ref 11.6–15.9)
LYMPH#: 1.4 10*3/uL (ref 0.9–3.3)
LYMPH%: 39.2 % (ref 14.0–49.7)
MCH: 28.4 pg (ref 25.1–34.0)
MCHC: 32 g/dL (ref 31.5–36.0)
MCV: 88.6 fL (ref 79.5–101.0)
MONO#: 0.4 10*3/uL (ref 0.1–0.9)
MONO%: 11.9 % (ref 0.0–14.0)
NEUT#: 1.6 10*3/uL (ref 1.5–6.5)
NEUT%: 44.6 % (ref 38.4–76.8)
Platelets: 192 10*3/uL (ref 145–400)
RBC: 3.75 10*6/uL (ref 3.70–5.45)
RDW: 16.6 % — ABNORMAL HIGH (ref 11.2–14.5)
WBC: 3.7 10*3/uL — ABNORMAL LOW (ref 3.9–10.3)

## 2014-11-23 MED ORDER — SODIUM CHLORIDE 0.9 % IV SOLN
800.0000 mg/m2 | Freq: Once | INTRAVENOUS | Status: AC
  Administered 2014-11-23: 1482 mg via INTRAVENOUS
  Filled 2014-11-23: qty 38.98

## 2014-11-23 MED ORDER — HEPARIN SOD (PORK) LOCK FLUSH 100 UNIT/ML IV SOLN
500.0000 [IU] | Freq: Once | INTRAVENOUS | Status: AC | PRN
  Administered 2014-11-23: 500 [IU]
  Filled 2014-11-23: qty 5

## 2014-11-23 MED ORDER — SODIUM CHLORIDE 0.9 % IV SOLN
Freq: Once | INTRAVENOUS | Status: AC
  Administered 2014-11-23: 15:00:00 via INTRAVENOUS

## 2014-11-23 MED ORDER — SODIUM CHLORIDE 0.9 % IJ SOLN
10.0000 mL | INTRAMUSCULAR | Status: DC | PRN
  Administered 2014-11-23: 10 mL
  Filled 2014-11-23: qty 10

## 2014-11-23 MED ORDER — PROCHLORPERAZINE MALEATE 10 MG PO TABS
10.0000 mg | ORAL_TABLET | Freq: Once | ORAL | Status: AC
  Administered 2014-11-23: 10 mg via ORAL

## 2014-11-23 MED ORDER — PROCHLORPERAZINE MALEATE 10 MG PO TABS
ORAL_TABLET | ORAL | Status: AC
  Filled 2014-11-23: qty 1

## 2014-11-23 NOTE — Patient Instructions (Signed)
[facility] Discharge Instructions for Patients Receiving Chemotherapy  Today you received the following chemotherapy agents:  Gemzar  To help prevent nausea and vomiting after your treatment, we encourage you to take your nausea medication as ordered per MD.   If you develop nausea and vomiting that is not controlled by your nausea medication, call the clinic.   BELOW ARE SYMPTOMS THAT SHOULD BE REPORTED IMMEDIATELY:  *FEVER GREATER THAN 100.5 F  *CHILLS WITH OR WITHOUT FEVER  NAUSEA AND VOMITING THAT IS NOT CONTROLLED WITH YOUR NAUSEA MEDICATION  *UNUSUAL SHORTNESS OF BREATH  *UNUSUAL BRUISING OR BLEEDING  TENDERNESS IN MOUTH AND THROAT WITH OR WITHOUT PRESENCE OF ULCERS  *URINARY PROBLEMS  *BOWEL PROBLEMS  UNUSUAL RASH Items with * indicate a potential emergency and should be followed up as soon as possible.  Feel free to call the clinic you have any questions or concerns. The clinic phone number is [phone].    

## 2014-11-23 NOTE — Progress Notes (Signed)
WBC 3.7, ALT 99, AST 88. MD notified per treatment plan. OK to treat with Gemzar per Dr. Alvy Bimler

## 2014-11-23 NOTE — Telephone Encounter (Signed)
Lft pt msg confirming adding lab/flush to next visit also sent msg through mychart...Marland KitchenMarland KitchenMarland Kitchen KJ

## 2014-11-24 ENCOUNTER — Ambulatory Visit: Payer: Medicare Other | Attending: Gynecologic Oncology | Admitting: Gynecologic Oncology

## 2014-11-24 ENCOUNTER — Other Ambulatory Visit (HOSPITAL_COMMUNITY)
Admission: RE | Admit: 2014-11-24 | Discharge: 2014-11-24 | Disposition: A | Discharge status: Home / Self Care | Payer: Medicare Other | Source: Ambulatory Visit | Attending: Gynecologic Oncology | Admitting: Gynecologic Oncology

## 2014-11-24 ENCOUNTER — Encounter: Payer: Self-pay | Admitting: Gynecologic Oncology

## 2014-11-24 VITALS — BP 115/71 | HR 94 | Temp 98.2°F | Resp 18 | Wt 179.4 lb

## 2014-11-24 DIAGNOSIS — Z01411 Encounter for gynecological examination (general) (routine) with abnormal findings: Secondary | ICD-10-CM | POA: Insufficient documentation

## 2014-11-24 DIAGNOSIS — C541 Malignant neoplasm of endometrium: Secondary | ICD-10-CM

## 2014-11-24 DIAGNOSIS — N898 Other specified noninflammatory disorders of vagina: Secondary | ICD-10-CM
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
## 2014-11-24 NOTE — Patient Instructions (Signed)
We will call you with the biopsy results and the pap smear results.

## 2014-11-26 ENCOUNTER — Other Ambulatory Visit: Payer: Self-pay | Admitting: Cardiology

## 2014-11-29 ENCOUNTER — Telehealth: Payer: Self-pay | Admitting: *Deleted

## 2014-11-29 ENCOUNTER — Other Ambulatory Visit: Payer: Self-pay | Admitting: Gynecologic Oncology

## 2014-11-29 ENCOUNTER — Telehealth: Payer: Self-pay | Admitting: Gynecologic Oncology

## 2014-11-29 DIAGNOSIS — C250 Malignant neoplasm of head of pancreas: Secondary | ICD-10-CM

## 2014-11-29 DIAGNOSIS — C541 Malignant neoplasm of endometrium: Secondary | ICD-10-CM

## 2014-11-29 LAB — CYTOLOGY - PAP

## 2014-11-29 NOTE — Telephone Encounter (Signed)
Spoke with the patient about vaginal biopsy results.  Also informed of Dr. Elenora Gamma recommendations for vaginal RT and base pelvic and or groin XRT based on her upcoming CT scan.  Verbalizing understanding.  Advised that Dr. [name] and Ned Card, NP were made aware of the plan.  Dr. [name] agreeable with the patient proceeding with radiation while receiving chemotherapy for pancreatic cancer.  CT CAP to be scheduled and patient will receive a call back with the date and time.  Referral has been made to Dr. Sondra Come.  Patient advised to call for any questions or concerns.

## 2014-11-29 NOTE — Progress Notes (Signed)
GYN Location of Tumor / Histology: Vaginal tumor - left lateral wall, patient has a history of endometrial cancer s/p hysterectomy 06/02/2011  [name] presented per Dr. Alycia Rossetti, with symptoms [id number] vaginal bleeding since May or June 2015.   Biopsies revealed:   11/24/14 Diagnosis Vagina, biopsy, left lateral wall - ADENOCARCINOMA WITH FOCAL SQUAMOUS DIFFERENTIATION  Past/Anticipated interventions by Gyn/Onc surgery, if any: 06/02/2011 - total robotic hysterectomy, bilateral salpingo-oophorectomy, vaginal biopsy 11/24/14  Past/Anticipated interventions by medical oncology, if any: adjuvant gemcitabine started 10/26/2014 for pancreatic cancer.   Weight changes, if any: yes, was 190lb with whipple surgery now 159.9lb  Bowel/Bladder complaints, if any: bleeding on pad , regular bowel movements, but this am bm was dark/light green,   Nausea/Vomiting, if any: nausea yes, has has zofran in her [pocket,  Pain issues, if any: mild discomfort in lower abdomen/  SAFETY ISSUES:NO  Prior radiation? No  Pacemaker/ICD? NO  Possible current pregnancy? no  Is the patient on methotrexate? no  Current Complaints / other details:  Married, G2P2, menses age 69, age 30 first live birth,  Patient has pancreatic cancer - receiving chemotherapy under care of Dr. [name].  She had a whipple procedure 08/30/14.

## 2014-11-29 NOTE — Telephone Encounter (Signed)
Notified pt of scheduled CT appointment. Pt is scheduled 12/06/14 @ 8:00 am at [facility] long Radiology department. Pt agreed with time and date

## 2014-11-30 ENCOUNTER — Ambulatory Visit
Admission: RE | Admit: 2014-11-30 | Discharge: 2014-11-30 | Disposition: A | Discharge status: Home / Self Care | Payer: Medicare Other | Source: Ambulatory Visit | Attending: Radiation Oncology | Admitting: Radiation Oncology

## 2014-11-30 ENCOUNTER — Encounter: Payer: Self-pay | Admitting: Radiation Oncology

## 2014-11-30 VITALS — BP 109/76 | HR 62 | Temp 97.5°F | Resp 16 | Ht 62.0 in | Wt 155.9 lb

## 2014-11-30 DIAGNOSIS — Z9049 Acquired absence of other specified parts of digestive tract: Secondary | ICD-10-CM | POA: Insufficient documentation

## 2014-11-30 DIAGNOSIS — K219 Gastro-esophageal reflux disease without esophagitis: Secondary | ICD-10-CM | POA: Insufficient documentation

## 2014-11-30 DIAGNOSIS — C250 Malignant neoplasm of head of pancreas: Secondary | ICD-10-CM | POA: Insufficient documentation

## 2014-11-30 DIAGNOSIS — Z510 Encounter for antineoplastic radiation therapy: Secondary | ICD-10-CM | POA: Diagnosis present

## 2014-11-30 DIAGNOSIS — Z96653 Presence of artificial knee joint, bilateral: Secondary | ICD-10-CM | POA: Insufficient documentation

## 2014-11-30 DIAGNOSIS — Z7901 Long term (current) use of anticoagulants: Secondary | ICD-10-CM | POA: Insufficient documentation

## 2014-11-30 DIAGNOSIS — Z90710 Acquired absence of both cervix and uterus: Secondary | ICD-10-CM | POA: Diagnosis not present

## 2014-11-30 DIAGNOSIS — C541 Malignant neoplasm of endometrium: Secondary | ICD-10-CM | POA: Diagnosis not present

## 2014-11-30 DIAGNOSIS — I480 Paroxysmal atrial fibrillation: Secondary | ICD-10-CM | POA: Insufficient documentation

## 2014-11-30 DIAGNOSIS — Z7951 Long term (current) use of inhaled steroids: Secondary | ICD-10-CM | POA: Diagnosis not present

## 2014-11-30 DIAGNOSIS — Z803 Family history of malignant neoplasm of breast: Secondary | ICD-10-CM | POA: Insufficient documentation

## 2014-11-30 DIAGNOSIS — L599 Disorder of the skin and subcutaneous tissue related to radiation, unspecified: Secondary | ICD-10-CM | POA: Diagnosis not present

## 2014-11-30 DIAGNOSIS — Z79899 Other long term (current) drug therapy: Secondary | ICD-10-CM | POA: Diagnosis not present

## 2014-11-30 DIAGNOSIS — I10 Essential (primary) hypertension: Secondary | ICD-10-CM | POA: Insufficient documentation

## 2014-11-30 DIAGNOSIS — Z8041 Family history of malignant neoplasm of ovary: Secondary | ICD-10-CM | POA: Insufficient documentation

## 2014-11-30 DIAGNOSIS — Z800 Family history of malignant neoplasm of digestive organs: Secondary | ICD-10-CM | POA: Insufficient documentation

## 2014-11-30 NOTE — Progress Notes (Signed)
Please see the Nurse Progress Note in the MD Initial Consult Encounter for this patient. 

## 2014-11-30 NOTE — Progress Notes (Signed)
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 female who is seen out courtesy of Dr. Nancy [name] for an opinion concerning radiation therapy as part of management of patient's recently diagnosed recurrent endometrial cancer.. The patient presented with postmenopausal bleeding in 2012. Biopsy of the endometrium was performed which revealed grade 1 endometrioid adenocarcinoma. On 11/30/2010 patient underwent total robotic hysterectomy bilateral salpingo-oophorectomy. Pathology from the patient's surgery revealed a grade 1 endometrioid adenocarcinoma with squamous differentiation. There was 18% myometrial invasion. No lymphovascular space involvement and no evidence of metastatic spread. Given the early stage of the patient's lesion (1 A grade 1) no adjuvant treatment was recommended. The patient has been followed closely by gynecologic oncology since her surgery approximately 4 years ago. The past several weeks the patient's had some intermittent vaginal spotting. She did not address this issue in light of her developing a more significant problem with jaundice.She was ultimately found to have adenocarcinoma the head of pancreas. She proceeded to undergo a Whipple procedure in December. Her postoperative course is been significant for intractable nausea and vomiting. She did require several weeks stay within the hospital. Pathology from her Whipple surgery showed a T3, N0 stage. The patient was seen by Dr. Julieanne [name] who recommended adjuvant chemotherapy. she has started her  adjuvant gemcitabine chemotherapy for her pancreatic cancer. Earlier this month patient was seen for routine follow-up in gynecologic oncology concerning her endometrial cancer. On exam March 3 the patient was noted to have a friable lesion along the left lateral vaginal sidewall which measures approximately 2 x 1 cm in size. Patient proceeded to undergo biopsy of this area which revealed adenocarcinoma with focal squamous differentiation. In light of the above findings the patient is now seen in radiation oncology for consideration for treatment concerning her recurrent endometrial cancer.  PREVIOUS RADIATION THERAPY: No  PAST MEDICAL HISTORY:  has a past medical history of History of cervical spine trauma (2010); H. pylori infection; Paroxysmal atrial fibrillation; Obesity; Hypertension; GERD (gastroesophageal reflux disease); Diverticulosis; Internal hemorrhoids; Chronic gastritis; Intestinal metaplasia of gastric mucosa; Cholelithiasis; Ischemic colitis (06/07/2014); Pneumonia (1989; 1990; 1991); Arthritis; DDD (degenerative disc disease), cervical; H/O cardiovascular stress test; Severe protein-calorie malnutrition; Pulmonary embolism; Pancreatic cancer (2015); Tubular adenoma of colon (2007); and Endometrial cancer (2012).    PAST SURGICAL HISTORY: Past Surgical History  Procedure Laterality Date  . Tubal ligation    . Knee arthroscopy Bilateral   . Shoulder open rotator cuff repair Right   . Total shoulder arthroplasty Left   . Ankle reconstruction Right   . Colonoscopy  12/18/2011    Procedure: COLONOSCOPY;  Surgeon: Savannah Dragon, MD;  Location: [facility];  Service: Endoscopy;  Laterality: N/A;  . Anterior cervical decomp/discectomy fusion  06/17/2012    Procedure: ANTERIOR CERVICAL DECOMPRESSION/DISCECTOMY FUSION 1 LEVEL;  Surgeon: Savannah Schools, MD;  Location: [place];  Service: Orthopedics;  Laterality: N/A;  ANTERIOR CERVICAL DISCECTOMY FUSION (acdf) C-3-C4   . Heel spur surgery Left  cyst removed   . Total knee arthroplasty Right 01/13/2013    Procedure: TOTAL KNEE ARTHROPLASTY;  Surgeon: Savannah Alf, MD;  Location: [facility];  Service: Orthopedics;  Laterality: Right;  . Total knee arthroplasty Left 05/03/2013    Procedure: LEFT TOTAL KNEE ARTHROPLASTY;  Surgeon: Savannah Alf, MD;  Location: [facility];  Service: Orthopedics;  Laterality: Left;  . Abdominal hysterectomy  2012  . Ercp N/A 06/15/2014    Procedure: ENDOSCOPIC RETROGRADE CHOLANGIOPANCREATOGRAPHY (ERCP);  Surgeon: Savannah Banister, MD;  Location: [facility];  Service: Gastroenterology;  Laterality: N/A;  . Eus N/A 07/28/2014    Procedure: UPPER ENDOSCOPIC ULTRASOUND (EUS) LINEAR;  Surgeon: Savannah Banister, MD;  Location: [facility];  Service: Endoscopy;  Laterality: N/A;  . Whipple procedure N/A 08/30/2014    Procedure: WHIPPLE PROCEDURE;  Surgeon: Savannah Klein, MD;  Location: [place];  Service: General;  Laterality: N/A;  . Laparoscopy N/A 08/30/2014    Procedure: LAPAROSCOPY DIAGNOSTIC;  Surgeon: Savannah Klein, MD;  Location: [place];  Service: General;  Laterality: N/A;  . Cholecystectomy open  08/2014  . Joint replacement    . Back surgery    . Fracture surgery    . Portacath placement Left 10/21/2014    Procedure: INSERTION PORT-A-CATH;  Surgeon: Savannah Klein, MD;  Location: [facility];  Service: General;  Laterality: Left;    FAMILY HISTORY: family history includes Brain cancer in her brother; Breast cancer in her sister; Breast cancer (age of onset: 6) in her daughter; Breast cancer (age of onset: 36) in her sister; Cancer in her maternal aunt and other; Colon cancer (age of onset: 46) in her sister; Diabetes in her mother; Healthy in her sister and sister; Heart attack in her father; Heart failure in her father and mother; Hypertension in her mother; Ovarian cancer (age of onset: 26) in her daughter; Pancreatic cancer in her other; Stroke in her mother. There is no history of Esophageal cancer or Stomach  cancer.  SOCIAL HISTORY:  reports that she has never smoked. She has never used smokeless tobacco. She reports that she does not drink alcohol or use illicit drugs. accompanied by her husband and 2 daughters on evaluation today  ALLERGIES: Ace inhibitors; Codeine; Scopolamine; and Sulfa antibiotics  MEDICATIONS:  Current Outpatient Prescriptions  Medication Sig Dispense Refill  . acetaminophen (TYLENOL) 500 MG tablet Take 1,000 mg by mouth every 6 (six) hours as needed for pain.     Marland Kitchen dextromethorphan-guaiFENesin (MUCINEX DM) 30-600 MG per 12 hr tablet Take 1 tablet by mouth 3 (three) times daily as needed. 40 tablet 0  . Eszopiclone 3 MG TABS Take 1.5 mg by mouth at bedtime.     . feeding supplement, ENSURE COMPLETE, (ENSURE COMPLETE) LIQD Take 237 mLs by mouth daily. Chocolate    . flecainide (TAMBOCOR) 100 MG tablet TAKE 1 TABLET BY MOUTH TWICE A DAY 180 tablet 1  . fluticasone (FLONASE) 50 MCG/ACT nasal spray Place 1 spray into both nostrils 2 (two) times daily as needed.     . lidocaine-prilocaine (EMLA) cream Apply small amount over port area 1-2 hours prior to treatment and cover with plastic wrap.  DO NOT RUB IN. 30 g prn  . LORazepam (ATIVAN) 1 MG tablet Take 0.5 tablets (0.5 mg total) by mouth every 8 (eight) hours as needed for anxiety. 45 tablet 0  . losartan (COZAAR) 50 MG tablet Take 50 mg by mouth every morning.    . metoprolol succinate (TOPROL-XL) 25 MG 24  hr tablet Take 25 mg by mouth every morning.    . metoprolol succinate (TOPROL-XL) 25 MG 24 hr tablet TAKE 1 TABLET BY MOUTH EVERY DAY 30 tablet 0  . Multiple Vitamins-Minerals (CENTRUM SILVER PO) Take 1 tablet by mouth every morning.     Marland Kitchen omeprazole (PRILOSEC) 20 MG capsule Take 2 capsules (40 mg total) by mouth 2 (two) times daily before a meal. 60 capsule 6  . ondansetron (ZOFRAN) 4 MG tablet Take 1 tablet (4 mg total) by mouth every 6 (six) hours as needed for nausea or vomiting. 30 tablet 3  . oxyCODONE-acetaminophen  (PERCOCET/ROXICET) 5-325 MG per tablet Take 1-2 tablets by mouth every 4 (four) hours as needed for moderate pain. 30 tablet 0  . rivaroxaban (XARELTO) 20 MG TABS tablet Take 1 tablet (20 mg total) by mouth at bedtime.    . vitamin C (ASCORBIC ACID) 500 MG tablet Take 500 mg by mouth every morning.     [name] 20 MG TABS tablet TAKE 1 TABLET BY MOUTH EVERY DAY 30 tablet 0   No current facility-administered medications for this encounter.   Facility-Administered Medications Ordered in Other Encounters  Medication Dose Route Frequency Provider Last Rate Last Dose  . sodium chloride 0.9 % bolus 1,000 mL  1,000 mL Intravenous Once Coralie Keens, MD        REVIEW OF SYSTEMS:  A 15 point review of systems is documented in the electronic medical record. This was obtained by the nursing staff. However, I reviewed this with the patient to discuss relevant findings and make appropriate changes.  Patient continues to have significant problems with nausea but with significant medication this is more manageable. Patient however will "lose her meals frequently" patient reports pain in the left upper pelvis region. She denies any hematuria or rectal bleeding.   PHYSICAL EXAM:  height is 5\' 2"  (1.575 m) and weight is 155 lb 14.4 oz (70.716 kg). Her oral temperature is 97.5 F (36.4 C). Her blood pressure is 109/76 and her pulse is 62. Her respiration is 16.   BP 109/76 mmHg  Pulse 62  Temp(Src) 97.5 F (36.4 C) (Oral)  Resp 16  Ht 5\' 2"  (1.575 m)  Wt 155 lb 14.4 oz (70.716 kg)  BMI 28.51 kg/m2  General Appearance:    Alert, cooperative, no distress, appears stated age, somewhat tearful dealing emotional issues concerning 2 malignancies   Head:    Normocephalic, without obvious abnormality, atraumatic  Eyes:    PERRL, conjunctiva/corneas clear, EOM's intact,         Nose:   Nares normal, septum midline, mucosa normal, no drainage    or sinus tenderness  Throat:   Lips, mucosa, and tongue normal;   gums normal  Neck:   Supple, symmetrical, trachea midline, no adenopathy;    thyroid:  no enlargement/tenderness/nodules; no carotid   bruit or JVD  Back:     Symmetric, no curvature, ROM normal, no CVA tenderness  Lungs:     Clear to auscultation bilaterally, respirations unlabored  Chest Wall:    No tenderness or deformity   Heart:    Regular rate and rhythm, S1 and S2 normal, no murmur, rub   or gallop     Abdomen:     Soft, non-tender, bowel sounds active all four quadrants,    no masses, no organomegaly, well-healed vertical scar from her Whipple procedure   Genitalia:   deferred until completion of imaging studies      Extremities:  Extremities normal, atraumatic, no cyanosis or edema  Pulses:   2+ and symmetric all extremities  Skin:   Skin color, texture, turgor normal, no rashes or lesions  Lymph nodes:   Cervical, supraclavicular, and axillary nodes normal  Neurologic:    normal strength, sensation and reflexes    throughout     ECOG = 1    1 - Symptomatic but completely ambulatory (Restricted in physically strenuous activity but ambulatory and able to carry out work of a light or sedentary nature. For example, light housework, office work)   LABORATORY DATA:  Lab Results  Component Value Date   WBC 3.7* 11/23/2014   HGB 10.6* 11/23/2014   HCT 33.2* 11/23/2014   MCV 88.6 11/23/2014   PLT 192 11/23/2014   NEUTROABS 1.6 11/23/2014   Lab Results  Component Value Date   NA 137 11/23/2014   K 4.5 11/23/2014   CL 100 10/21/2014   CO2 24 11/23/2014   GLUCOSE 136 11/23/2014   CREATININE 0.7 11/23/2014   CALCIUM 8.8 11/23/2014      RADIOGRAPHY: Body CT pending     IMPRESSION: Recurrent endometrial cancer.  Per Dr. Elenora Gamma exam the patient appears to have a early recurrence along the left lateral vaginal wall. She will proceed with CT scan of the chest abdomen and pelvis to rule out more extensive disease. The patient would be a candidate for radiation therapy  for management of her vaginal vault recurrence. Ideally this would likely include external beam radiation therapy directed at the pelvis followed by intracavitary brachytherapy treatments. However given the patient's significant problems with nausea and ongoing treatment for her pancreatic cancer, external beam radiation therapy directed at the pelvis area may be quite difficult for the patient and may hinder her adjuvant therapy for her pancreatic cancer. The patient should be able to tolerate intracavitary brachytherapy treatments well during her adjuvant chemotherapy for pancreatic cancer.  PLAN: The patient will undergo reevaluation and pelvic examination after her body CT scan on March 15. Final details concerning her management will depend on results of these staging studies.  I spent 60 minutes minutes face to face with the patient and more than 50% of that time was spent in counseling and/or coordination of care.   ------------------------------------------------  Blair Promise, PhD, MD

## 2014-12-01 ENCOUNTER — Other Ambulatory Visit: Payer: Self-pay | Admitting: Oncology

## 2014-12-01 DIAGNOSIS — C250 Malignant neoplasm of head of pancreas: Secondary | ICD-10-CM

## 2014-12-02 ENCOUNTER — Encounter: Payer: Self-pay | Admitting: Physician Assistant

## 2014-12-04 ENCOUNTER — Other Ambulatory Visit: Payer: Self-pay | Admitting: Oncology

## 2014-12-06 ENCOUNTER — Encounter (HOSPITAL_COMMUNITY): Payer: Self-pay

## 2014-12-06 ENCOUNTER — Ambulatory Visit (HOSPITAL_COMMUNITY)
Admission: RE | Admit: 2014-12-06 | Discharge: 2014-12-06 | Disposition: A | Discharge status: Home / Self Care | Payer: Medicare Other | Source: Ambulatory Visit | Attending: Gynecologic Oncology | Admitting: Gynecologic Oncology

## 2014-12-06 DIAGNOSIS — C250 Malignant neoplasm of head of pancreas: Secondary | ICD-10-CM | POA: Diagnosis present

## 2014-12-06 DIAGNOSIS — C541 Malignant neoplasm of endometrium: Secondary | ICD-10-CM | POA: Insufficient documentation

## 2014-12-06 MED ORDER — IOHEXOL 300 MG/ML  SOLN
100.0000 mL | Freq: Once | INTRAMUSCULAR | Status: AC | PRN
  Administered 2014-12-06: 100 mL via INTRAVENOUS

## 2014-12-07 ENCOUNTER — Telehealth: Payer: Self-pay | Admitting: *Deleted

## 2014-12-07 ENCOUNTER — Other Ambulatory Visit (HOSPITAL_BASED_OUTPATIENT_CLINIC_OR_DEPARTMENT_OTHER): Payer: Medicare Other

## 2014-12-07 ENCOUNTER — Ambulatory Visit (HOSPITAL_BASED_OUTPATIENT_CLINIC_OR_DEPARTMENT_OTHER): Payer: Medicare Other

## 2014-12-07 ENCOUNTER — Telehealth: Payer: Self-pay | Admitting: Nurse Practitioner

## 2014-12-07 ENCOUNTER — Ambulatory Visit (HOSPITAL_BASED_OUTPATIENT_CLINIC_OR_DEPARTMENT_OTHER): Payer: Medicare Other | Admitting: Nurse Practitioner

## 2014-12-07 ENCOUNTER — Ambulatory Visit: Payer: Medicare Other

## 2014-12-07 ENCOUNTER — Other Ambulatory Visit: Payer: Medicare Other

## 2014-12-07 ENCOUNTER — Ambulatory Visit: Payer: Medicare Other | Admitting: Nurse Practitioner

## 2014-12-07 VITALS — BP 124/76 | HR 62 | Temp 97.8°F | Resp 18 | Ht 62.0 in | Wt 156.0 lb

## 2014-12-07 DIAGNOSIS — I2699 Other pulmonary embolism without acute cor pulmonale: Secondary | ICD-10-CM

## 2014-12-07 DIAGNOSIS — Z809 Family history of malignant neoplasm, unspecified: Secondary | ICD-10-CM

## 2014-12-07 DIAGNOSIS — C541 Malignant neoplasm of endometrium: Secondary | ICD-10-CM

## 2014-12-07 DIAGNOSIS — C250 Malignant neoplasm of head of pancreas: Secondary | ICD-10-CM

## 2014-12-07 DIAGNOSIS — I4891 Unspecified atrial fibrillation: Secondary | ICD-10-CM

## 2014-12-07 DIAGNOSIS — Z5111 Encounter for antineoplastic chemotherapy: Secondary | ICD-10-CM

## 2014-12-07 DIAGNOSIS — Z95828 Presence of other vascular implants and grafts: Secondary | ICD-10-CM

## 2014-12-07 LAB — COMPREHENSIVE METABOLIC PANEL (CC13)
ALBUMIN: 3.1 g/dL — AB (ref 3.5–5.0)
ALT: 79 U/L — AB (ref 0–55)
ANION GAP: 10 meq/L (ref 3–11)
AST: 81 U/L — AB (ref 5–34)
Alkaline Phosphatase: 114 U/L (ref 40–150)
BUN: 8.5 mg/dL (ref 7.0–26.0)
CALCIUM: 8.1 mg/dL — AB (ref 8.4–10.4)
CO2: 25 meq/L (ref 22–29)
Chloride: 106 mEq/L (ref 98–109)
Creatinine: 0.7 mg/dL (ref 0.6–1.1)
EGFR: 89 mL/min/{1.73_m2} — ABNORMAL LOW (ref >90)
Glucose: 138 mg/dl (ref 70–140)
POTASSIUM: 4 meq/L (ref 3.5–5.1)
Sodium: 140 mEq/L (ref 136–145)
TOTAL PROTEIN: 5.8 g/dL — AB (ref 6.4–8.3)
Total Bilirubin: 0.5 mg/dL (ref 0.20–1.20)

## 2014-12-07 LAB — CBC WITH DIFFERENTIAL/PLATELET
BASO%: 0.6 % (ref 0.0–2.0)
Basophils Absolute: 0 10*3/uL (ref 0.0–0.1)
EOS%: 2.6 % (ref 0.0–7.0)
Eosinophils Absolute: 0.1 10*3/uL (ref 0.0–0.5)
HCT: 32.8 % — ABNORMAL LOW (ref 34.8–46.6)
HGB: 10.4 g/dL — ABNORMAL LOW (ref 11.6–15.9)
LYMPH#: 1 10*3/uL (ref 0.9–3.3)
LYMPH%: 30.5 % (ref 14.0–49.7)
MCH: 28.2 pg (ref 25.1–34.0)
MCHC: 31.7 g/dL (ref 31.5–36.0)
MCV: 88.7 fL (ref 79.5–101.0)
MONO#: 0.4 10*3/uL (ref 0.1–0.9)
MONO%: 11.3 % (ref 0.0–14.0)
NEUT#: 1.8 10*3/uL (ref 1.5–6.5)
NEUT%: 55 % (ref 38.4–76.8)
Platelets: 224 10*3/uL (ref 145–400)
RBC: 3.7 10*6/uL (ref 3.70–5.45)
RDW: 18.6 % — AB (ref 11.2–14.5)
WBC: 3.3 10*3/uL — AB (ref 3.9–10.3)

## 2014-12-07 MED ORDER — PROCHLORPERAZINE MALEATE 10 MG PO TABS
ORAL_TABLET | ORAL | Status: AC
  Filled 2014-12-07: qty 1

## 2014-12-07 MED ORDER — SODIUM CHLORIDE 0.9 % IV SOLN
800.0000 mg/m2 | Freq: Once | INTRAVENOUS | Status: AC
  Administered 2014-12-07: 1482 mg via INTRAVENOUS
  Filled 2014-12-07: qty 38.98

## 2014-12-07 MED ORDER — SODIUM CHLORIDE 0.9 % IV SOLN
Freq: Once | INTRAVENOUS | Status: AC
  Administered 2014-12-07: 12:00:00 via INTRAVENOUS

## 2014-12-07 MED ORDER — SODIUM CHLORIDE 0.9 % IJ SOLN
10.0000 mL | INTRAMUSCULAR | Status: DC | PRN
  Administered 2014-12-07: 10 mL via INTRAVENOUS
  Filled 2014-12-07: qty 10

## 2014-12-07 MED ORDER — PROCHLORPERAZINE MALEATE 10 MG PO TABS
10.0000 mg | ORAL_TABLET | Freq: Once | ORAL | Status: AC
  Administered 2014-12-07: 10 mg via ORAL

## 2014-12-07 MED ORDER — HEPARIN SOD (PORK) LOCK FLUSH 100 UNIT/ML IV SOLN
500.0000 [IU] | Freq: Once | INTRAVENOUS | Status: AC
  Administered 2014-12-07: 500 [IU] via INTRAVENOUS
  Filled 2014-12-07: qty 5

## 2014-12-07 NOTE — Patient Instructions (Signed)
[facility] Discharge Instructions for Patients Receiving Chemotherapy  Today you received the following chemotherapy agents gemzar  To help prevent nausea and vomiting after your treatment, we encourage you to take your nausea medication as directed.  If you develop nausea and vomiting that is not controlled by your nausea medication, call the clinic.   BELOW ARE SYMPTOMS THAT SHOULD BE REPORTED IMMEDIATELY:  *FEVER GREATER THAN 100.5 F  *CHILLS WITH OR WITHOUT FEVER  NAUSEA AND VOMITING THAT IS NOT CONTROLLED WITH YOUR NAUSEA MEDICATION  *UNUSUAL SHORTNESS OF BREATH  *UNUSUAL BRUISING OR BLEEDING  TENDERNESS IN MOUTH AND THROAT WITH OR WITHOUT PRESENCE OF ULCERS  *URINARY PROBLEMS  *BOWEL PROBLEMS  UNUSUAL RASH Items with * indicate a potential emergency and should be followed up as soon as possible.  Feel free to call the clinic you have any questions or concerns. The clinic phone number is [phone].  

## 2014-12-07 NOTE — Telephone Encounter (Signed)
Per staff message and POF I have scheduled appts. Advised scheduler of appts. JMW  

## 2014-12-07 NOTE — Patient Instructions (Signed)

## 2014-12-07 NOTE — Progress Notes (Addendum)
[facility] OFFICE PROGRESS NOTE   Diagnosis:  Pancreas cancer  INTERVAL HISTORY:   Ms. Manolis returns as scheduled. She completed cycle 3 gemcitabine on 11/23/2014. The nausea overall is better. No mouth sores. No diarrhea except associated with the CT contrast. She denies fever. No shortness of breath or cough. No rash. She continues to have intermittent vaginal bleeding.  Objective:  Vital signs in last 24 hours:  Blood pressure 124/76, pulse 62, temperature 97.8 F (36.6 C), temperature source Oral, resp. rate 18, height 5\' 2"  (1.575 m), weight 156 lb (70.761 kg).    HEENT: No thrush or ulcers. Resp: Lungs clear bilaterally. Cardio: Regular rate and rhythm. GI: Abdomen soft and nontender. No hepatomegaly. Vascular: No leg edema. Calves soft and nontender.  Skin: No rash. Port-A-Cath without erythema.    Lab Results:  Lab Results  Component Value Date   WBC 3.3* 12/07/2014   HGB 10.4* 12/07/2014   HCT 32.8* 12/07/2014   MCV 88.7 12/07/2014   PLT 224 12/07/2014   NEUTROABS 1.8 12/07/2014    Imaging:  Ct Chest W Contrast  12/06/2014   CLINICAL DATA:  Pancreatic cancer diagnosed September 2015. Whipple procedure in December 2015. History of endometrial cancer diagnosed in 2012. Recurrence endometrial carcinoma on vaginal biopsy.  EXAM: CT CHEST, ABDOMEN, AND PELVIS WITH CONTRAST  TECHNIQUE: Multidetector CT imaging of the chest, abdomen and pelvis was performed following the standard protocol during bolus administration of intravenous contrast.  CONTRAST:  153mL OMNIPAQUE IOHEXOL 300 MG/ML  SOLN  COMPARISON:  CT 09/16/2014, 10 11/12/1998, MRI 06/24/2014  FINDINGS: CT CHEST FINDINGS  Mediastinum/Nodes: No axillary supraclavicular lymphadenopathy. Port in the left anterior chest wall. No mediastinal hilar lymphadenopathy. Right lower paratracheal lymph node measures 10 mm compared to 10 mm on prior. No central pulmonary embolism.  Lungs/Pleura: 5 mm nodule in  the left upper lobe on image 16 is not changed. No new pulmonary nodules. No pleural fluid or thickening.  CT ABDOMEN AND PELVIS FINDINGS  Hepatobiliary: Marked decreased attenuation within the liver. No biliary duct dilatation. Patient status post Whipple procedure. No enhancing hepatic lesion.  Pancreas: Patient status post Whipple procedure without evidence of local recurrence. There is small round fluid collection adjacent to the left adrenal gland measuring 15 mm. This is not changed from comparison exam and likely represents a or fluid collection associated with prior pancreatitis.  Spleen: Normal spleen.  Adrenals/Urinary Tract: Adrenal glands kidneys are normal. Ureters and bladder normal.  Stomach/Bowel: Stomach demonstrates antrectomy with gastrojejunostomy. No evidence of obstruction. Small bowel and colon are normal. Appendix is normal.  Vascular/Lymphatic: Abdominal aorta is normal caliber. No retroperitoneal periportal lymphadenopathy.  Reproductive: No mass evident at the vaginal cuff. No sidewall nodularity. No pelvic lymphadenopathy. A 5 mm right external iliac lymph node on image 90, series 3. 6 mm left external iliac lymph node on image 100 series 3.  Other: No peritoneal disease.  No free fluid  Musculoskeletal: No aggressive osseous lesion.  IMPRESSION: Chest Impression:  1. Stable small left upper lobe pulmonary nodule. 2. No evidence of thoracic metastasis.  Abdomen / Pelvis Impression:  1. Patient status post Whipple procedure without evidence of local pancreatic cancer recurrence. 2. Small fluid collection adjacent to the left adrenal gland likely represents a postinflammatory fluid collection associated with prior pancreatitis. Recommend attention on follow-up. 3. Severe hepatic steatosis is new from prior. 4. No evidence of local extension of endometrial carcinoma. Carcinoma not well-defined at the vaginal cuff. 5. Small iliac  lymph nodes are not pathologic by size criteria.    Electronically Signed   By: Suzy Bouchard M.D.   On: 12/06/2014 08:54   Ct Abdomen Pelvis W Contrast  12/06/2014   CLINICAL DATA:  Pancreatic cancer diagnosed September 2015. Whipple procedure in December 2015. History of endometrial cancer diagnosed in 2012. Recurrence endometrial carcinoma on vaginal biopsy.  EXAM: CT CHEST, ABDOMEN, AND PELVIS WITH CONTRAST  TECHNIQUE: Multidetector CT imaging of the chest, abdomen and pelvis was performed following the standard protocol during bolus administration of intravenous contrast.  CONTRAST:  179mL OMNIPAQUE IOHEXOL 300 MG/ML  SOLN  COMPARISON:  CT 09/16/2014, 10 11/12/1998, MRI 06/24/2014  FINDINGS: CT CHEST FINDINGS  Mediastinum/Nodes: No axillary supraclavicular lymphadenopathy. Port in the left anterior chest wall. No mediastinal hilar lymphadenopathy. Right lower paratracheal lymph node measures 10 mm compared to 10 mm on prior. No central pulmonary embolism.  Lungs/Pleura: 5 mm nodule in the left upper lobe on image 16 is not changed. No new pulmonary nodules. No pleural fluid or thickening.  CT ABDOMEN AND PELVIS FINDINGS  Hepatobiliary: Marked decreased attenuation within the liver. No biliary duct dilatation. Patient status post Whipple procedure. No enhancing hepatic lesion.  Pancreas: Patient status post Whipple procedure without evidence of local recurrence. There is small round fluid collection adjacent to the left adrenal gland measuring 15 mm. This is not changed from comparison exam and likely represents a or fluid collection associated with prior pancreatitis.  Spleen: Normal spleen.  Adrenals/Urinary Tract: Adrenal glands kidneys are normal. Ureters and bladder normal.  Stomach/Bowel: Stomach demonstrates antrectomy with gastrojejunostomy. No evidence of obstruction. Small bowel and colon are normal. Appendix is normal.  Vascular/Lymphatic: Abdominal aorta is normal caliber. No retroperitoneal periportal lymphadenopathy.  Reproductive: No mass  evident at the vaginal cuff. No sidewall nodularity. No pelvic lymphadenopathy. A 5 mm right external iliac lymph node on image 90, series 3. 6 mm left external iliac lymph node on image 100 series 3.  Other: No peritoneal disease.  No free fluid  Musculoskeletal: No aggressive osseous lesion.  IMPRESSION: Chest Impression:  1. Stable small left upper lobe pulmonary nodule. 2. No evidence of thoracic metastasis.  Abdomen / Pelvis Impression:  1. Patient status post Whipple procedure without evidence of local pancreatic cancer recurrence. 2. Small fluid collection adjacent to the left adrenal gland likely represents a postinflammatory fluid collection associated with prior pancreatitis. Recommend attention on follow-up. 3. Severe hepatic steatosis is new from prior. 4. No evidence of local extension of endometrial carcinoma. Carcinoma not well-defined at the vaginal cuff. 5. Small iliac lymph nodes are not pathologic by size criteria.   Electronically Signed   By: Suzy Bouchard M.D.   On: 12/06/2014 08:54    Medications: I have reviewed the patient's current medications.  Assessment/Plan: 1. 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  Gemcitabine held 11/02/2014 due to neutropenia.  Gemcitabine 11/09/2014 dose reduced 800 mg/m.  Gemcitabine held 11/16/2014 due to neutropenia.  Gemcitabine resumed 11/23/2014 every 2 week schedule.  2. 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 Admission with post ERCP pancreatitis 06/16/2014  4. History of abdominal pain secondary to #1  5. Pulmonary embolism  diagnosed  on a CT of the abdomen 09/16/2014  Negative lower extremity Dopplers 09/17/2014  6. Multiple orthopedic surgical procedures  7. Endometrial cancer,stage IA, grade 1 endometrioid adenocarcinoma, 18% myometrial invasion, no lymphovascular space involvement, negative washings  Status post robotic total hysterectomy and bilateral salpingo-oophorectomy 11/30/2010  Recurrent tumor left lateral vagina status post biopsy 11/24/2014 with pathology confirming adenocarcinoma with focal squamous differentiation consistent with endometrial adenocarcinoma  Staging CT scans 12/06/2014 with no evidence of local pancreatic cancer recurrence. Small fluid collection adjacent to the left adrenal gland. Severe hepatic steatosis. No evidence of local extension of endometrial carcinoma. Carcinoma not well-defined at the vaginal cuff. 5 mm right external iliac lymph node. 3.6 mm left external iliac lymph node  8. History of atrial fibrillation-maintained on xarelto  9. Family history of multiple cancers-negative CancerNext gene panel  10. Prolonged nausea following the pancreaticoduodenectomy. Improved 10/26/2014.  11. Port-A-Cath placement 10/21/2014.  12. Neutropenia secondary to chemotherapy 11/02/2014, 11/16/2014.    Disposition: Ms. Bulluck appears stable. She has completed 3 cycles of gemcitabine. The gemcitabine is now being given on an every other week schedule. Plan to proceed with cycle 4 today as scheduled.  She was recently found to have recurrent endometrial cancer involving the left lateral vagina. Brachytherapy is planned under the direction of Dr. Sondra Come.  She will return for cycle 5 gemcitabine in 2 weeks. We will see her in follow-up in 4 weeks. She will contact the office in the interim with any problems.  Patient seen with Dr. [name].    Ned Card ANP/GNP-BC   12/07/2014  11:27 AM  She has been diagnosed with a local recurrence of endometrial cancer. Her case  was presented at the GI tumor conference on 12/07/2014. The plan is to complete brachytherapy with Dr. Sondra Come. We will continue adjuvant gemcitabine for treatment of the pancreas cancer.  Julieanne Manson, M.D.

## 2014-12-07 NOTE — Telephone Encounter (Signed)
Pt confirmed labs/ov per 03/16 POF, gave pt AVS and calendar.... KJ, sent msg to add chemo

## 2014-12-08 LAB — CANCER ANTIGEN 19-9: CA 19 9: 115.2 U/mL — AB (ref <35.0)

## 2014-12-11 ENCOUNTER — Other Ambulatory Visit: Payer: Self-pay | Admitting: Oncology

## 2014-12-12 NOTE — Progress Notes (Signed)
GYN Location of Tumor / Histology: Vaginal tumor - left lateral wall, patient has a history of endometrial cancer s/p hysterectomy 06/02/2011  [name] presented per Dr. Alycia Rossetti, with symptoms [id number] vaginal bleeding since May or June 2015.   Biopsies revealed:   11/24/14 Diagnosis Vagina, biopsy, left lateral wall - ADENOCARCINOMA WITH FOCAL SQUAMOUS DIFFERENTIATION  Past/Anticipated interventions by Gyn/Onc surgery, if any: 06/02/2011 - total robotic hysterectomy, bilateral salpingo-oophorectomy, vaginal biopsy 11/24/14  Past/Anticipated interventions by medical oncology, if any: Initiation of adjuvant gemcitabine 10/26/2014.  Gemcitabine held 11/02/2014 due to neutropenia. Gemcitabine 11/09/2014 dose reduced 800 mg/m. Gemcitabine held 11/16/2014 due to neutropenia. Gemcitabine resumed 11/23/2014 every 2 week schedule.  Has next chemotherapy on 12/21/14.  Weight changes, if any: yes, was 190lb with whipple surgery now 159.9lb  Bowel/Bladder complaints, if any:  Patient reports having lots of gas.  Nausea/Vomiting, if any: nausea yes  Pain issues, if any: no  SAFETY ISSUES:NO  Prior radiation? No  Pacemaker/ICD? NO  Possible current pregnancy? no  Is the patient on methotrexate? no  Current Complaints / other details: Married, G2P2, menses age 29, age 7 first live birth, Patient has pancreatic cancer - receiving chemotherapy under care of Dr. [name]. She had a whipple procedure 08/30/14.  She is here with her husband and daughter.  She reports having a small amount of vaginal bleeding last Wednesday.

## 2014-12-14 ENCOUNTER — Encounter: Payer: Self-pay | Admitting: Radiation Oncology

## 2014-12-14 ENCOUNTER — Ambulatory Visit
Admission: RE | Admit: 2014-12-14 | Discharge: 2014-12-14 | Disposition: A | Discharge status: Home / Self Care | Payer: Medicare Other | Source: Ambulatory Visit | Attending: Radiation Oncology | Admitting: Radiation Oncology

## 2014-12-14 VITALS — BP 117/71 | HR 62 | Temp 97.5°F | Resp 16 | Ht 62.0 in | Wt 154.5 lb

## 2014-12-14 DIAGNOSIS — C541 Malignant neoplasm of endometrium: Secondary | ICD-10-CM

## 2014-12-14 DIAGNOSIS — Z510 Encounter for antineoplastic radiation therapy: Secondary | ICD-10-CM | POA: Diagnosis not present

## 2014-12-14 NOTE — Progress Notes (Signed)
Please see the Nurse Progress Note in the MD Initial Consult Encounter for this patient. 

## 2014-12-14 NOTE — Progress Notes (Signed)
Radiation Oncology         [phone]) [phone] ________________________________  Name: [name] MRN: [id number]  Date: 12/14/2014  DOB: 14-Jan-1946  Re-evaluation Note  CC: Aura Dials, PA-C  Nancy Marus, MD    ICD-9-CM ICD-10-CM   1. 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 The patient initially was seen in consultation March 9 of. Since that time the patient has undergone additional imaging including CT scan of the chest, abdomen and pelvis with contrast. Surgical changes were noted in the abdominal area from her pancreatic cancer. No evidence of recurrence in this area. Scans of the pelvis area also showed no evidence of recurrence or enlarged lymphadenopathy. The patient's lesion in the vaginal vault was not seen on CT imaging, consistent with clinical findings. No suspicious findings in the chest region                        ALLERGIES:  is allergic to ace inhibitors; codeine; scopolamine; and sulfa antibiotics.  Meds: Current Outpatient Prescriptions  Medication Sig Dispense Refill  . acetaminophen (TYLENOL) 500 MG tablet Take 1,000 mg by mouth every 6 (six) hours as needed for pain.     Marland Kitchen dextromethorphan-guaiFENesin (MUCINEX DM) 30-600 MG per 12 hr tablet Take 1 tablet by mouth 3 (three) times daily as needed. 40 tablet 0  . Eszopiclone 3 MG TABS Take 1.5 mg by mouth at bedtime.     . feeding supplement, ENSURE COMPLETE, (ENSURE COMPLETE) LIQD Take 237 mLs by mouth daily. Chocolate    . flecainide (TAMBOCOR) 100 MG tablet TAKE 1 TABLET BY MOUTH TWICE A DAY 180 tablet 1  . fluticasone (FLONASE) 50 MCG/ACT nasal spray Place 1 spray into both nostrils 2 (two) times daily as needed.     . lidocaine-prilocaine (EMLA) cream Apply small amount over port area 1-2 hours prior to  treatment and cover with plastic wrap.  DO NOT RUB IN. 30 g prn  . LORazepam (ATIVAN) 1 MG tablet Take 0.5 tablets (0.5 mg total) by mouth every 8 (eight) hours as needed for anxiety. 45 tablet 0  . losartan (COZAAR) 50 MG tablet Take 50 mg by mouth every morning.    . metoprolol succinate (TOPROL-XL) 25 MG 24 hr tablet Take 25 mg by mouth every morning.    . Multiple Vitamins-Minerals (CENTRUM SILVER PO) Take 1 tablet by mouth every morning.     Marland Kitchen omeprazole (PRILOSEC) 20 MG capsule Take 2 capsules (40 mg total) by mouth 2 (two) times daily before a meal. 60 capsule 6  . ondansetron (ZOFRAN) 4 MG tablet Take 1 tablet (4 mg total) by mouth every 6 (six) hours as needed for nausea or vomiting. 30 tablet 3  . oxyCODONE-acetaminophen (PERCOCET/ROXICET) 5-325 MG per tablet Take 1-2 tablets by mouth every 4 (four) hours as needed for moderate pain. 30 tablet 0  . rivaroxaban (XARELTO) 20 MG TABS tablet Take 1 tablet (20 mg total) by mouth at bedtime.    . vitamin C (ASCORBIC ACID) 500 MG tablet Take 500 mg by mouth every morning.     [name] 20 MG TABS tablet TAKE 1 TABLET BY MOUTH EVERY DAY 30 tablet 0  . metoprolol succinate (TOPROL-XL) 25 MG 24 hr tablet TAKE 1 TABLET BY MOUTH EVERY DAY 30 tablet  0   No current facility-administered medications for this encounter.   Facility-Administered Medications Ordered in Other Encounters  Medication Dose Route Frequency Provider Last Rate Last Dose  . sodium chloride 0.9 % bolus 1,000 mL  1,000 mL Intravenous Once Coralie Keens, MD        Physical Findings: The patient is in no acute distress. Patient is alert and oriented.  height is 5\' 2"  (1.575 m) and weight is 154 lb 8 oz (70.081 kg). Her oral temperature is 97.5 F (36.4 C). Her blood pressure is 117/71 and her pulse is 62. Her respiration is 16. .  No detailed examination today.  Lab Findings: Lab Results  Component Value Date   WBC 3.3* 12/07/2014   HGB 10.4* 12/07/2014   HCT 32.8*  12/07/2014   MCV 88.7 12/07/2014   PLT 224 12/07/2014    Radiographic Findings: Ct Chest W Contrast  12/06/2014   CLINICAL DATA:  Pancreatic cancer diagnosed September 2015. Whipple procedure in December 2015. History of endometrial cancer diagnosed in 2012. Recurrence endometrial carcinoma on vaginal biopsy.  EXAM: CT CHEST, ABDOMEN, AND PELVIS WITH CONTRAST  TECHNIQUE: Multidetector CT imaging of the chest, abdomen and pelvis was performed following the standard protocol during bolus administration of intravenous contrast.  CONTRAST:  120mL OMNIPAQUE IOHEXOL 300 MG/ML  SOLN  COMPARISON:  CT 09/16/2014, 10 11/12/1998, MRI 06/24/2014  FINDINGS: CT CHEST FINDINGS  Mediastinum/Nodes: No axillary supraclavicular lymphadenopathy. Port in the left anterior chest wall. No mediastinal hilar lymphadenopathy. Right lower paratracheal lymph node measures 10 mm compared to 10 mm on prior. No central pulmonary embolism.  Lungs/Pleura: 5 mm nodule in the left upper lobe on image 16 is not changed. No new pulmonary nodules. No pleural fluid or thickening.  CT ABDOMEN AND PELVIS FINDINGS  Hepatobiliary: Marked decreased attenuation within the liver. No biliary duct dilatation. Patient status post Whipple procedure. No enhancing hepatic lesion.  Pancreas: Patient status post Whipple procedure without evidence of local recurrence. There is small round fluid collection adjacent to the left adrenal gland measuring 15 mm. This is not changed from comparison exam and likely represents a or fluid collection associated with prior pancreatitis.  Spleen: Normal spleen.  Adrenals/Urinary Tract: Adrenal glands kidneys are normal. Ureters and bladder normal.  Stomach/Bowel: Stomach demonstrates antrectomy with gastrojejunostomy. No evidence of obstruction. Small bowel and colon are normal. Appendix is normal.  Vascular/Lymphatic: Abdominal aorta is normal caliber. No retroperitoneal periportal lymphadenopathy.  Reproductive: No mass  evident at the vaginal cuff. No sidewall nodularity. No pelvic lymphadenopathy. A 5 mm right external iliac lymph node on image 90, series 3. 6 mm left external iliac lymph node on image 100 series 3.  Other: No peritoneal disease.  No free fluid  Musculoskeletal: No aggressive osseous lesion.  IMPRESSION: Chest Impression:  1. Stable small left upper lobe pulmonary nodule. 2. No evidence of thoracic metastasis.  Abdomen / Pelvis Impression:  1. Patient status post Whipple procedure without evidence of local pancreatic cancer recurrence. 2. Small fluid collection adjacent to the left adrenal gland likely represents a postinflammatory fluid collection associated with prior pancreatitis. Recommend attention on follow-up. 3. Severe hepatic steatosis is new from prior. 4. No evidence of local extension of endometrial carcinoma. Carcinoma not well-defined at the vaginal cuff. 5. Small iliac lymph nodes are not pathologic by size criteria.   Electronically Signed   By: Suzy Bouchard M.D.   On: 12/06/2014 08:54   Ct Abdomen Pelvis W Contrast  12/06/2014   CLINICAL  DATA:  Pancreatic cancer diagnosed September 2015. Whipple procedure in December 2015. History of endometrial cancer diagnosed in 2012. Recurrence endometrial carcinoma on vaginal biopsy.  EXAM: CT CHEST, ABDOMEN, AND PELVIS WITH CONTRAST  TECHNIQUE: Multidetector CT imaging of the chest, abdomen and pelvis was performed following the standard protocol during bolus administration of intravenous contrast.  CONTRAST:  186mL OMNIPAQUE IOHEXOL 300 MG/ML  SOLN  COMPARISON:  CT 09/16/2014, 10 11/12/1998, MRI 06/24/2014  FINDINGS: CT CHEST FINDINGS  Mediastinum/Nodes: No axillary supraclavicular lymphadenopathy. Port in the left anterior chest wall. No mediastinal hilar lymphadenopathy. Right lower paratracheal lymph node measures 10 mm compared to 10 mm on prior. No central pulmonary embolism.  Lungs/Pleura: 5 mm nodule in the left upper lobe on image 16 is not  changed. No new pulmonary nodules. No pleural fluid or thickening.  CT ABDOMEN AND PELVIS FINDINGS  Hepatobiliary: Marked decreased attenuation within the liver. No biliary duct dilatation. Patient status post Whipple procedure. No enhancing hepatic lesion.  Pancreas: Patient status post Whipple procedure without evidence of local recurrence. There is small round fluid collection adjacent to the left adrenal gland measuring 15 mm. This is not changed from comparison exam and likely represents a or fluid collection associated with prior pancreatitis.  Spleen: Normal spleen.  Adrenals/Urinary Tract: Adrenal glands kidneys are normal. Ureters and bladder normal.  Stomach/Bowel: Stomach demonstrates antrectomy with gastrojejunostomy. No evidence of obstruction. Small bowel and colon are normal. Appendix is normal.  Vascular/Lymphatic: Abdominal aorta is normal caliber. No retroperitoneal periportal lymphadenopathy.  Reproductive: No mass evident at the vaginal cuff. No sidewall nodularity. No pelvic lymphadenopathy. A 5 mm right external iliac lymph node on image 90, series 3. 6 mm left external iliac lymph node on image 100 series 3.  Other: No peritoneal disease.  No free fluid  Musculoskeletal: No aggressive osseous lesion.  IMPRESSION: Chest Impression:  1. Stable small left upper lobe pulmonary nodule. 2. No evidence of thoracic metastasis.  Abdomen / Pelvis Impression:  1. Patient status post Whipple procedure without evidence of local pancreatic cancer recurrence. 2. Small fluid collection adjacent to the left adrenal gland likely represents a postinflammatory fluid collection associated with prior pancreatitis. Recommend attention on follow-up. 3. Severe hepatic steatosis is new from prior. 4. No evidence of local extension of endometrial carcinoma. Carcinoma not well-defined at the vaginal cuff. 5. Small iliac lymph nodes are not pathologic by size criteria.   Electronically Signed   By: Suzy Bouchard M.D.    On: 12/06/2014 08:54    Impression: Recurrent endometrial cancer. The patient per Dr. Elenora Gamma exam and recent CT scans appears to have early recurrence along the left lateral vaginal wall. The patient would be a candidate for radiation therapy for management of her vaginal vault recurrence. Ideally this would likely include external beam radiation therapy directed at the pelvis followed by intracavitary brachytherapy treatments. However given the patient's significant problems with nausea and ongoing treatment for her pancreatic cancer, external beam radiation therapy directed at the pelvis area may be quite difficult for the patient and may hinder her adjuvant therapy for her pancreatic cancer. The patient should be able to tolerate intracavitary brachytherapy treatments well during her adjuvant chemotherapy for pancreatic cancer.  Plan:  The patient will proceed with planning and her first intracavitary brachytherapy treatment within the next several days. She will receive 5 high-dose-rate brachytherapy treatments directed at the vaginal vault. Anticipated HDR dose will be approximately 30 to 35 gray.  ____________________________________ Blair Promise, MD

## 2014-12-15 ENCOUNTER — Telehealth: Payer: Self-pay | Admitting: *Deleted

## 2014-12-15 NOTE — Telephone Encounter (Signed)
CALLED PATIENT TO INFORM OF NEW HDR VAG. CUFF CASE, SPOKE WITH PATIENT AND SHE IS AWARE OF THESE APPTS. 

## 2014-12-18 ENCOUNTER — Other Ambulatory Visit: Payer: Self-pay | Admitting: Oncology

## 2014-12-19 ENCOUNTER — Telehealth: Payer: Self-pay | Admitting: *Deleted

## 2014-12-19 NOTE — Telephone Encounter (Signed)
CALLED PATIENT TO UPDATE HER ON HER APPTS., LVM FOR A RETURN CALL

## 2014-12-20 ENCOUNTER — Telehealth: Payer: Self-pay | Admitting: *Deleted

## 2014-12-20 NOTE — Telephone Encounter (Signed)
Per staff message and POF I have scheduled appts. Advised scheduler of appts. JMW  

## 2014-12-21 ENCOUNTER — Other Ambulatory Visit (HOSPITAL_BASED_OUTPATIENT_CLINIC_OR_DEPARTMENT_OTHER): Payer: Medicare Other

## 2014-12-21 ENCOUNTER — Telehealth: Payer: Self-pay | Admitting: *Deleted

## 2014-12-21 ENCOUNTER — Ambulatory Visit (HOSPITAL_BASED_OUTPATIENT_CLINIC_OR_DEPARTMENT_OTHER): Payer: Medicare Other

## 2014-12-21 ENCOUNTER — Ambulatory Visit: Payer: Medicare Other

## 2014-12-21 DIAGNOSIS — C250 Malignant neoplasm of head of pancreas: Secondary | ICD-10-CM

## 2014-12-21 DIAGNOSIS — Z5111 Encounter for antineoplastic chemotherapy: Secondary | ICD-10-CM

## 2014-12-21 DIAGNOSIS — C541 Malignant neoplasm of endometrium: Secondary | ICD-10-CM

## 2014-12-21 DIAGNOSIS — Z95828 Presence of other vascular implants and grafts: Secondary | ICD-10-CM

## 2014-12-21 LAB — COMPREHENSIVE METABOLIC PANEL (CC13)
ALT: 92 U/L — AB (ref 0–55)
AST: 112 U/L — AB (ref 5–34)
Albumin: 3.1 g/dL — ABNORMAL LOW (ref 3.5–5.0)
Alkaline Phosphatase: 131 U/L (ref 40–150)
Anion Gap: 10 mEq/L (ref 3–11)
BUN: 11.1 mg/dL (ref 7.0–26.0)
CO2: 25 mEq/L (ref 22–29)
CREATININE: 0.7 mg/dL (ref 0.6–1.1)
Calcium: 8.2 mg/dL — ABNORMAL LOW (ref 8.4–10.4)
Chloride: 104 mEq/L (ref 98–109)
GLUCOSE: 115 mg/dL (ref 70–140)
Potassium: 4.3 mEq/L (ref 3.5–5.1)
Sodium: 138 mEq/L (ref 136–145)
Total Bilirubin: 0.63 mg/dL (ref 0.20–1.20)
Total Protein: 5.8 g/dL — ABNORMAL LOW (ref 6.4–8.3)

## 2014-12-21 LAB — CBC WITH DIFFERENTIAL/PLATELET
BASO%: 0.6 % (ref 0.0–2.0)
BASOS ABS: 0 10*3/uL (ref 0.0–0.1)
EOS%: 3.2 % (ref 0.0–7.0)
Eosinophils Absolute: 0.1 10*3/uL (ref 0.0–0.5)
HEMATOCRIT: 29.9 % — AB (ref 34.8–46.6)
HEMOGLOBIN: 10.1 g/dL — AB (ref 11.6–15.9)
LYMPH#: 1.1 10*3/uL (ref 0.9–3.3)
LYMPH%: 34.5 % (ref 14.0–49.7)
MCH: 29.8 pg (ref 25.1–34.0)
MCHC: 33.8 g/dL (ref 31.5–36.0)
MCV: 88.2 fL (ref 79.5–101.0)
MONO#: 0.4 10*3/uL (ref 0.1–0.9)
MONO%: 13.3 % (ref 0.0–14.0)
NEUT#: 1.5 10*3/uL (ref 1.5–6.5)
NEUT%: 48.4 % (ref 38.4–76.8)
Platelets: 178 10*3/uL (ref 145–400)
RBC: 3.39 10*6/uL — AB (ref 3.70–5.45)
RDW: 19.5 % — ABNORMAL HIGH (ref 11.2–14.5)
WBC: 3.2 10*3/uL — ABNORMAL LOW (ref 3.9–10.3)

## 2014-12-21 MED ORDER — PROCHLORPERAZINE MALEATE 10 MG PO TABS
ORAL_TABLET | ORAL | Status: AC
  Filled 2014-12-21: qty 1

## 2014-12-21 MED ORDER — SODIUM CHLORIDE 0.9 % IV SOLN
800.0000 mg/m2 | Freq: Once | INTRAVENOUS | Status: AC
  Administered 2014-12-21: 1482 mg via INTRAVENOUS
  Filled 2014-12-21: qty 38.98

## 2014-12-21 MED ORDER — SODIUM CHLORIDE 0.9 % IJ SOLN
10.0000 mL | INTRAMUSCULAR | Status: DC | PRN
  Administered 2014-12-21: 10 mL via INTRAVENOUS
  Filled 2014-12-21: qty 10

## 2014-12-21 MED ORDER — HEPARIN SOD (PORK) LOCK FLUSH 100 UNIT/ML IV SOLN
500.0000 [IU] | Freq: Once | INTRAVENOUS | Status: AC | PRN
  Administered 2014-12-21: 500 [IU]
  Filled 2014-12-21: qty 5

## 2014-12-21 MED ORDER — SODIUM CHLORIDE 0.9 % IV SOLN
Freq: Once | INTRAVENOUS | Status: AC
  Administered 2014-12-21: 11:00:00 via INTRAVENOUS

## 2014-12-21 MED ORDER — PROCHLORPERAZINE MALEATE 10 MG PO TABS
10.0000 mg | ORAL_TABLET | Freq: Once | ORAL | Status: AC
  Administered 2014-12-21: 10 mg via ORAL

## 2014-12-21 MED ORDER — SODIUM CHLORIDE 0.9 % IJ SOLN
10.0000 mL | INTRAMUSCULAR | Status: DC | PRN
  Administered 2014-12-21: 10 mL
  Filled 2014-12-21: qty 10

## 2014-12-21 NOTE — Telephone Encounter (Signed)
CALLED PATIENT TO REMIND OF HDR APPTS. FOR 12-22-14, SPOKE WITH HUSBAND AND HE SAID THAT SHE IS AWARE OF THESE APPTS.

## 2014-12-21 NOTE — Progress Notes (Signed)
Verbal consent given to treat pt today with abnormal labs per Dr. [name].

## 2014-12-21 NOTE — Patient Instructions (Signed)
[facility] Discharge Instructions for Patients Receiving Chemotherapy  Today you received the following chemotherapy agents gemzar  To help prevent nausea and vomiting after your treatment, we encourage you to take your nausea medication as directed.  If you develop nausea and vomiting that is not controlled by your nausea medication, call the clinic.   BELOW ARE SYMPTOMS THAT SHOULD BE REPORTED IMMEDIATELY:  *FEVER GREATER THAN 100.5 F  *CHILLS WITH OR WITHOUT FEVER  NAUSEA AND VOMITING THAT IS NOT CONTROLLED WITH YOUR NAUSEA MEDICATION  *UNUSUAL SHORTNESS OF BREATH  *UNUSUAL BRUISING OR BLEEDING  TENDERNESS IN MOUTH AND THROAT WITH OR WITHOUT PRESENCE OF ULCERS  *URINARY PROBLEMS  *BOWEL PROBLEMS  UNUSUAL RASH Items with * indicate a potential emergency and should be followed up as soon as possible.  Feel free to call the clinic you have any questions or concerns. The clinic phone number is [phone].  

## 2014-12-21 NOTE — Patient Instructions (Signed)

## 2014-12-22 ENCOUNTER — Encounter: Payer: Self-pay | Admitting: Radiation Oncology

## 2014-12-22 ENCOUNTER — Ambulatory Visit
Admission: RE | Admit: 2014-12-22 | Discharge: 2014-12-22 | Disposition: A | Discharge status: Home / Self Care | Payer: Medicare Other | Source: Ambulatory Visit | Attending: Radiation Oncology | Admitting: Radiation Oncology

## 2014-12-22 VITALS — BP 106/65 | HR 57 | Temp 97.6°F | Ht 62.0 in | Wt 153.7 lb

## 2014-12-22 DIAGNOSIS — C541 Malignant neoplasm of endometrium: Secondary | ICD-10-CM

## 2014-12-22 DIAGNOSIS — Z510 Encounter for antineoplastic radiation therapy: Secondary | ICD-10-CM | POA: Diagnosis not present

## 2014-12-22 NOTE — Progress Notes (Signed)
Please see the Nurse Progress Note in the MD Follow Up Encounter for this patient. 

## 2014-12-22 NOTE — Progress Notes (Signed)
  Radiation Oncology         [phone]) [phone] ________________________________  Name: [name] MRN: [id number]  Date: 12/22/2014  DOB: Apr 09, 1946  Simple treatment device note   HDR BRACHYTHERAPY  DIAGNOSIS:  Recurrent endometrial cancer  NARRATIVE:  the patient had construction of her custom vaginal cylinder today. She will be treated with a 3 cm diameter cylinder. More proximally will be a 3.0 cm ring. More distally will be 2.5 cm rings    ________________________________  Blair Promise, PhD, MD

## 2014-12-22 NOTE — Progress Notes (Signed)
[name] here for follow up with her daughter.  She denies pain today but has had cramping in her lower abdomen on Monday.  She also reports having a small amount of red vaginal bleeding on Monday.  She had chemotherapy yesterday and will have it in two weeks.  She reports having nausea occasionally.  BP 106/65 mmHg  Pulse 57  Temp(Src) 97.6 F (36.4 C) (Oral)  Ht 5\' 2"  (1.575 m)  Wt 153 lb 11.2 oz (69.718 kg)  BMI 28.11 kg/m2

## 2014-12-22 NOTE — Progress Notes (Signed)
  Radiation Oncology         [phone]) [phone] ________________________________  Name: [name] MRN: [id number]  Date: 12/22/2014  DOB: [birth year]   HDR BRACHYTHERAPY   DIAGNOSIS:  Recurrent endometrial cancer  NARRATIVE: After planning was completed the patient was transferred to the high dose rate suite and placed in the dorsolithotomy position. Her custom vaginal cylinder was placed in the vaginal vault. This was affixed to the CT/MR stabilization plate to prevent slippage.  Verification simulation note  A fiducial marker was placed within the vaginal cylinder. the patient then proceeded to undergo an AP and lateral film documenting accurate placement of the vaginal cylinder for treatment.   High-dose-rate brachytherapy treatment  The remote afterloading device was attached to the vaginal cylinder by catheter system. The patient then proceeded to undergo her first high-dose-rate treatment directed at the vaginal vault. Patient was prescribed a dose of 6 grays to be delivered to the mucosal surface. This was achieved with 1 channel using multiple dwell positions. A treatment length of 6 cm was delivered. Treatment time was 583.7 seconds. The patient tolerated the procedure well. After completion of her therapy a radiation therapy a radiation survey was performed documenting return of the iridium source into the gamma med safe. Iridium 192 was the high-dose-rate source for treatment.         ________________________________  Blair Promise, PhD, MD

## 2014-12-22 NOTE — Progress Notes (Signed)
  Radiation Oncology         [phone] ________________________________  Name: [name] MRN: [id number]  Date: 12/22/2014  DOB: [birth year]  SIMULATION AND TREATMENT PLANNING NOTE HDR BRACHYTHERAPY  DIAGNOSIS:  Recurrent endometrial cancer  NARRATIVE:  The patient was brought to the [place] suite.  Identity was confirmed.  All relevant records and images related to the planned course of therapy were reviewed.  The patient freely provided informed written consent to proceed with treatment after reviewing the details related to the planned course of therapy. The consent form was witnessed and verified by the simulation staff.  Then, the patient was set-up in a stable reproducible  supine position for radiation therapy.  the patient's custom vaginal cylinder was placed in the proximal vagina. This was affixed to the CT/MR stabilization plate to prevent slippage CT images were obtained.  Surface markings were placed.  The CT images were loaded into the planning software.  Then the target and avoidance structures were contoured.  Treatment planning then occurred.  The radiation prescription was entered and confirmed.   I have requested : Brachytherapy Isodose Plan and Dosimetry Calculations to plan the radiation distribution.    PLAN:  The patient will receive 30 gray Gy in 5 fractions. The patient will be treated with iridium 192 as the high-dose-rate source. She will be treated with a 3 cm diameter cylinder. Treatment length will be 6 cm in light of the location of the lesion.    ________________________________  Blair Promise, PhD, MD

## 2014-12-22 NOTE — Progress Notes (Signed)
Radiation Oncology         [phone]) [phone] ________________________________  Name: [name] MRN: [id number]  Date: 12/22/2014  DOB: 1945-11-13  Vaginal brachytherapy procedure Note  CC: Aura Dials, PA-C  Nancy Marus, MD    ICD-9-CM ICD-10-CM   1. Endometrial ca 182.0 C54.1     Diagnosis:   Recurrent endometrial cancer   Narrative:  The patient returns today for planning and her first intracavitary brachytherapy treatment. She continues to have some problems with nausea and emesis but seems to be better overall concerning this issue. She continues to receive adjuvant chemotherapy for management of her pancreatic cancer. Patient had one episode of cramping 3 days ago with some mild vaginal bleeding. She has had none since.  No reports of hematuria or rectal bleeding.                      ALLERGIES:  is allergic to ace inhibitors; codeine; scopolamine; and sulfa antibiotics.  Meds: Current Outpatient Prescriptions  Medication Sig Dispense Refill  . acetaminophen (TYLENOL) 500 MG tablet Take 1,000 mg by mouth every 6 (six) hours as needed for pain.     Marland Kitchen dextromethorphan-guaiFENesin (MUCINEX DM) 30-600 MG per 12 hr tablet Take 1 tablet by mouth 3 (three) times daily as needed. 40 tablet 0  . Eszopiclone 3 MG TABS Take 1.5 mg by mouth at bedtime.     . feeding supplement, ENSURE COMPLETE, (ENSURE COMPLETE) LIQD Take 237 mLs by mouth daily. Chocolate    . flecainide (TAMBOCOR) 100 MG tablet TAKE 1 TABLET BY MOUTH TWICE A DAY 180 tablet 1  . fluticasone (FLONASE) 50 MCG/ACT nasal spray Place 1 spray into both nostrils 2 (two) times daily as needed.     . lidocaine-prilocaine (EMLA) cream Apply small amount over port area 1-2 hours prior to treatment and cover with plastic wrap.  DO NOT RUB IN. 30 g prn  . LORazepam (ATIVAN) 1 MG tablet Take 0.5 tablets (0.5 mg total) by mouth every 8 (eight) hours as needed for anxiety. 45 tablet 0  . losartan (COZAAR) 50 MG tablet Take 50 mg  by mouth every morning.    . metoprolol succinate (TOPROL-XL) 25 MG 24 hr tablet TAKE 1 TABLET BY MOUTH EVERY DAY 30 tablet 0  . Multiple Vitamins-Minerals (CENTRUM SILVER PO) Take 1 tablet by mouth every morning.     Marland Kitchen omeprazole (PRILOSEC) 20 MG capsule Take 2 capsules (40 mg total) by mouth 2 (two) times daily before a meal. 60 capsule 6  . ondansetron (ZOFRAN) 4 MG tablet Take 1 tablet (4 mg total) by mouth every 6 (six) hours as needed for nausea or vomiting. 30 tablet 3  . oxyCODONE-acetaminophen (PERCOCET/ROXICET) 5-325 MG per tablet Take 1-2 tablets by mouth every 4 (four) hours as needed for moderate pain. 30 tablet 0  . promethazine (PHENERGAN) 12.5 MG tablet     . rivaroxaban (XARELTO) 20 MG TABS tablet Take 1 tablet (20 mg total) by mouth at bedtime.    . vitamin C (ASCORBIC ACID) 500 MG tablet Take 500 mg by mouth every morning.     [name] 20 MG TABS tablet TAKE 1 TABLET BY MOUTH EVERY DAY 30 tablet 0   No current facility-administered medications for this encounter.   Facility-Administered Medications Ordered in Other Encounters  Medication Dose Route Frequency Provider Last Rate Last Dose  . sodium chloride 0.9 % bolus 1,000 mL  1,000 mL Intravenous Once Coralie Keens, MD  Physical Findings: The patient is in no acute distress. Patient is alert and oriented.  height is 5\' 2"  (1.575 m) and weight is 153 lb 11.2 oz (69.718 kg). Her oral temperature is 97.6 F (36.4 C). Her blood pressure is 106/65 and her pulse is 57. .  A speculum exam is performed. There are some erythematous changes along the left lateral vaginal vault approximately 2-3 cm from the cuff. This area bleeds easily with manipulation. On palpation this seems to be superficial and more located along the left lateral posterior vaginal wall. No other lesions noted in the vaginal vault  The patient proceeded to undergo fitting for her custom vaginal cylinder. The patient will be treated with a 3 cm diameter  cylinder. This diameter distends the vaginal vault without undue discomfort.  Lab Findings: Lab Results  Component Value Date   WBC 3.2* 12/21/2014   HGB 10.1* 12/21/2014   HCT 29.9* 12/21/2014   MCV 88.2 12/21/2014   PLT 178 12/21/2014      Impression:  Successful fitting for vaginal vault brachytherapy  Plan:  The patient will proceed to CT simulation for planning. Later this morning she will receive her first treatment.  ____________________________________ Blair Promise, MD

## 2014-12-25 ENCOUNTER — Telehealth: Payer: Self-pay | Admitting: Oncology

## 2014-12-25 NOTE — Telephone Encounter (Signed)
Per provider schedule labs/ov/chemo r/s, sent msg to pt through my chart.... KJ

## 2014-12-28 ENCOUNTER — Telehealth: Payer: Self-pay | Admitting: *Deleted

## 2014-12-28 NOTE — Telephone Encounter (Signed)
CALLED PATIENT TO REMIND OF HDR [place] 01-08-15 @ 10 AM, SPOKE WITH PATIENT'S HUSBAND, WILLIAM AND HE IS AWARE OF THIS APPT.

## 2014-12-29 ENCOUNTER — Ambulatory Visit
Admission: RE | Admit: 2014-12-29 | Discharge: 2014-12-29 | Disposition: A | Discharge status: Home / Self Care | Payer: Medicare Other | Source: Ambulatory Visit | Attending: Radiation Oncology | Admitting: Radiation Oncology

## 2014-12-29 DIAGNOSIS — C541 Malignant neoplasm of endometrium: Secondary | ICD-10-CM

## 2014-12-29 DIAGNOSIS — Z510 Encounter for antineoplastic radiation therapy: Secondary | ICD-10-CM | POA: Diagnosis not present

## 2014-12-29 NOTE — Progress Notes (Signed)
  Radiation Oncology         [phone] ________________________________  Name: [name] MRN: [id number]  Date: 12/29/2014  DOB: 11/10/1945   HDR BRACHYTHERAPY  DIAGNOSIS: Recurrent endometrial cancer  Vaginal brachytherapy procedure note  The patient was taken to the high-dose-rate suite and placed in the dorsolithotomy position. A pelvic exam was performed. The vaginal cuff was noted to be intact. No vaginal bleeding was noted in the vaginal vault. Patient proceeded to undergo placement of her custom vaginal cylinder. This was affixed to the CT/MR stabilization plate to prevent slippage. Patient tolerated the procedure well.  Simple treatment device note  Patient had construction of her custom vaginal cylinder. She will be treated with a 3 cm diameter cylinder. More distally will be 2.5 cm rings.  Verification simulation note  A fiducial marker was placed within the vaginal cylinder. The patient proceeded to undergo an AP and lateral film. This was compared to her planning films documenting accurate position of the vaginal cylinder for treatment.  High-dose-rate brachytherapy treatment  The remote afterloading device was attached to the vaginal cylinder by catheter system. The patient then proceeded to undergo her second high-dose-rate treatment directed at the vaginal vault. Patient was prescribed a dose of 6 grays to be delivered to the mucosal surface. This was achieved with 1 channel using multiple dwell positions. A treatment length of 6 cm was delivered. Treatment time was 238.9 seconds. The patient tolerated the procedure well. After completion of her therapy a radiation therapy a radiation survey was performed documenting return of the iridium source into the gamma med safe. Iridium 192 was the high-dose-rate source for treatment.   ________________________________  Blair Promise, PhD, MD

## 2014-12-30 ENCOUNTER — Other Ambulatory Visit: Payer: Self-pay | Admitting: Cardiology

## 2015-01-01 ENCOUNTER — Other Ambulatory Visit: Payer: Self-pay | Admitting: Oncology

## 2015-01-04 ENCOUNTER — Ambulatory Visit: Payer: Medicare Other | Admitting: Oncology

## 2015-01-04 ENCOUNTER — Telehealth: Payer: Self-pay | Admitting: *Deleted

## 2015-01-04 ENCOUNTER — Ambulatory Visit: Payer: Medicare Other

## 2015-01-04 ENCOUNTER — Other Ambulatory Visit: Payer: Medicare Other

## 2015-01-04 NOTE — Telephone Encounter (Signed)
Called patient to remind of HDR Tx. For 01-05-15 @ 10 am, spoke with patient and she is aware of this tx.

## 2015-01-05 ENCOUNTER — Ambulatory Visit
Admission: RE | Admit: 2015-01-05 | Discharge: 2015-01-05 | Disposition: A | Discharge status: Home / Self Care | Payer: Medicare Other | Source: Ambulatory Visit | Attending: Radiation Oncology | Admitting: Radiation Oncology

## 2015-01-05 ENCOUNTER — Telehealth: Payer: Self-pay | Admitting: Oncology

## 2015-01-05 ENCOUNTER — Ambulatory Visit (HOSPITAL_BASED_OUTPATIENT_CLINIC_OR_DEPARTMENT_OTHER): Payer: Medicare Other | Admitting: Nurse Practitioner

## 2015-01-05 ENCOUNTER — Other Ambulatory Visit (HOSPITAL_COMMUNITY)
Admission: AD | Admit: 2015-01-05 | Discharge: 2015-01-05 | Disposition: A | Discharge status: Home / Self Care | Payer: Medicare Other | Source: Ambulatory Visit | Attending: Oncology | Admitting: Oncology

## 2015-01-05 ENCOUNTER — Ambulatory Visit (INDEPENDENT_AMBULATORY_CARE_PROVIDER_SITE_OTHER): Payer: Medicare Other | Admitting: Physician Assistant

## 2015-01-05 ENCOUNTER — Ambulatory Visit (HOSPITAL_BASED_OUTPATIENT_CLINIC_OR_DEPARTMENT_OTHER): Payer: Medicare Other

## 2015-01-05 ENCOUNTER — Other Ambulatory Visit (HOSPITAL_BASED_OUTPATIENT_CLINIC_OR_DEPARTMENT_OTHER): Payer: Medicare Other

## 2015-01-05 ENCOUNTER — Encounter: Payer: Self-pay | Admitting: Physician Assistant

## 2015-01-05 ENCOUNTER — Ambulatory Visit: Payer: Medicare Other

## 2015-01-05 VITALS — BP 118/70 | HR 54 | Temp 97.5°F | Resp 18 | Ht 62.0 in | Wt 148.9 lb

## 2015-01-05 VITALS — BP 100/70 | HR 53 | Ht 62.0 in | Wt 149.0 lb

## 2015-01-05 DIAGNOSIS — C250 Malignant neoplasm of head of pancreas: Secondary | ICD-10-CM

## 2015-01-05 DIAGNOSIS — C541 Malignant neoplasm of endometrium: Secondary | ICD-10-CM | POA: Diagnosis not present

## 2015-01-05 DIAGNOSIS — I10 Essential (primary) hypertension: Secondary | ICD-10-CM | POA: Diagnosis not present

## 2015-01-05 DIAGNOSIS — C7989 Secondary malignant neoplasm of other specified sites: Secondary | ICD-10-CM | POA: Diagnosis not present

## 2015-01-05 DIAGNOSIS — I2699 Other pulmonary embolism without acute cor pulmonale: Secondary | ICD-10-CM

## 2015-01-05 DIAGNOSIS — Z5111 Encounter for antineoplastic chemotherapy: Secondary | ICD-10-CM | POA: Diagnosis not present

## 2015-01-05 DIAGNOSIS — I4891 Unspecified atrial fibrillation: Secondary | ICD-10-CM

## 2015-01-05 DIAGNOSIS — D701 Agranulocytosis secondary to cancer chemotherapy: Secondary | ICD-10-CM

## 2015-01-05 DIAGNOSIS — Z510 Encounter for antineoplastic radiation therapy: Secondary | ICD-10-CM | POA: Diagnosis not present

## 2015-01-05 DIAGNOSIS — I480 Paroxysmal atrial fibrillation: Secondary | ICD-10-CM | POA: Diagnosis not present

## 2015-01-05 DIAGNOSIS — Z95828 Presence of other vascular implants and grafts: Secondary | ICD-10-CM

## 2015-01-05 DIAGNOSIS — R197 Diarrhea, unspecified: Secondary | ICD-10-CM

## 2015-01-05 LAB — COMPREHENSIVE METABOLIC PANEL
ALT: 76 U/L — AB (ref 0–35)
AST: 93 U/L — AB (ref 0–37)
Albumin: 3.4 g/dL — ABNORMAL LOW (ref 3.5–5.2)
Alkaline Phosphatase: 111 U/L (ref 39–117)
Anion gap: 7 (ref 5–15)
BILIRUBIN TOTAL: 0.7 mg/dL (ref 0.3–1.2)
BUN: 9 mg/dL (ref 6–23)
CO2: 26 mmol/L (ref 19–32)
Calcium: 8.8 mg/dL (ref 8.4–10.5)
Chloride: 104 mmol/L (ref 96–112)
Creatinine, Ser: 0.7 mg/dL (ref 0.50–1.10)
GFR calc Af Amer: >90 mL/min (ref >90)
GFR, EST NON AFRICAN AMERICAN: 88 mL/min — AB (ref >90)
GLUCOSE: 146 mg/dL — AB (ref 70–99)
POTASSIUM: 3.7 mmol/L (ref 3.5–5.1)
Sodium: 137 mmol/L (ref 135–145)
Total Protein: 6.4 g/dL (ref 6.0–8.3)

## 2015-01-05 LAB — CBC WITH DIFFERENTIAL/PLATELET
BASO%: 0.8 % (ref 0.0–2.0)
BASOS ABS: 0 10*3/uL (ref 0.0–0.1)
EOS%: 2.8 % (ref 0.0–7.0)
Eosinophils Absolute: 0.1 10*3/uL (ref 0.0–0.5)
HEMATOCRIT: 31.1 % — AB (ref 34.8–46.6)
HEMOGLOBIN: 10.3 g/dL — AB (ref 11.6–15.9)
LYMPH%: 28 % (ref 14.0–49.7)
MCH: 30.3 pg (ref 25.1–34.0)
MCHC: 33.1 g/dL (ref 31.5–36.0)
MCV: 91.5 fL (ref 79.5–101.0)
MONO#: 0.4 10*3/uL (ref 0.1–0.9)
MONO%: 12.5 % (ref 0.0–14.0)
NEUT%: 55.9 % (ref 38.4–76.8)
NEUTROS ABS: 2 10*3/uL (ref 1.5–6.5)
Platelets: 230 10*3/uL (ref 145–400)
RBC: 3.4 10*6/uL — ABNORMAL LOW (ref 3.70–5.45)
RDW: 19.4 % — AB (ref 11.2–14.5)
WBC: 3.5 10*3/uL — ABNORMAL LOW (ref 3.9–10.3)
lymph#: 1 10*3/uL (ref 0.9–3.3)

## 2015-01-05 MED ORDER — SODIUM CHLORIDE 0.9 % IJ SOLN
10.0000 mL | INTRAMUSCULAR | Status: DC | PRN
  Administered 2015-01-05: 10 mL via INTRAVENOUS
  Filled 2015-01-05: qty 10

## 2015-01-05 MED ORDER — HEPARIN SOD (PORK) LOCK FLUSH 100 UNIT/ML IV SOLN
500.0000 [IU] | Freq: Once | INTRAVENOUS | Status: AC | PRN
  Administered 2015-01-05: 500 [IU]
  Filled 2015-01-05: qty 5

## 2015-01-05 MED ORDER — PROCHLORPERAZINE MALEATE 10 MG PO TABS
ORAL_TABLET | ORAL | Status: AC
  Filled 2015-01-05: qty 1

## 2015-01-05 MED ORDER — PROCHLORPERAZINE MALEATE 10 MG PO TABS
10.0000 mg | ORAL_TABLET | Freq: Once | ORAL | Status: AC
  Administered 2015-01-05: 10 mg via ORAL

## 2015-01-05 MED ORDER — SODIUM CHLORIDE 0.9 % IJ SOLN
10.0000 mL | INTRAMUSCULAR | Status: DC | PRN
  Administered 2015-01-05: 10 mL
  Filled 2015-01-05: qty 10

## 2015-01-05 MED ORDER — PANCRELIPASE (LIP-PROT-AMYL) 12000-38000 UNITS PO CPEP: 24000.0000 [IU] | ORAL_CAPSULE | Freq: Three times a day (TID) | ORAL | Status: DC

## 2015-01-05 MED ORDER — GEMCITABINE HCL CHEMO INJECTION 1 GM/26.3ML
800.0000 mg/m2 | Freq: Once | INTRAVENOUS | Status: AC
  Administered 2015-01-05: 1482 mg via INTRAVENOUS
  Filled 2015-01-05: qty 38.98

## 2015-01-05 MED ORDER — SODIUM CHLORIDE 0.9 % IV SOLN
Freq: Once | INTRAVENOUS | Status: AC
Start: 2015-01-05 — End: 2015-01-05
  Administered 2015-01-05: 11:00:00 via INTRAVENOUS

## 2015-01-05 NOTE — Telephone Encounter (Signed)
gave and printed appt sched and avs fo rpt for April and MAY

## 2015-01-05 NOTE — Patient Instructions (Addendum)
Labs None  Tests None  Follow Up  Your physician wants you to follow-up in:  [place] will receive a reminder letter in the mail two months in advance. If you don't receive a letter, please call our office to schedule the follow-up appointment.

## 2015-01-05 NOTE — Progress Notes (Signed)
  Radiation Oncology         [phone] ________________________________  Name: [name] MRN: [id number]  Date: 01/05/2015  DOB: Jun 17, 1946     HDR BRACHYTHERAPY  DIAGNOSIS: Recurrent endometrial cancer  Vaginal brachytherapy procedure note  The patient was taken to the high-dose-rate suite and placed in the dorsolithotomy position. A pelvic exam was performed. The vaginal cuff was noted to be intact. No vaginal bleeding was noted in the vaginal vault. Patient proceeded to undergo placement of her custom vaginal cylinder. This was affixed to the CT/MR stabilization plate to prevent slippage. Patient tolerated the procedure well.  Simple treatment device note  Patient had construction of her custom vaginal cylinder. She will be treated with a 3 cm diameter cylinder. More distally will be 2.5 cm rings.  Verification simulation note  A fiducial marker was placed within the vaginal cylinder. The patient proceeded to undergo an AP and lateral film. This was compared to her planning films documenting accurate position of the vaginal cylinder for treatment.  High-dose-rate brachytherapy treatment  The remote afterloading device was attached to the vaginal cylinder by catheter system. The patient then proceeded to undergo her third high-dose-rate treatment directed at the vaginal vault. Patient was prescribed a dose of 6 grays to be delivered to the mucosal surface. This was achieved with 1 channel using multiple dwell positions. A treatment length of 6 cm was delivered. Treatment time was 255.2 seconds. The patient tolerated the procedure well. After completion of her therapy a radiation therapy a radiation survey was performed documenting return of the iridium source into the gamma med safe. Iridium 192 was the high-dose-rate source for treatment.   ________________________________  Blair Promise, PhD, MD

## 2015-01-05 NOTE — Patient Instructions (Signed)
[facility] Discharge Instructions for Patients Receiving Chemotherapy  Today you received the following chemotherapy agents gemzar  To help prevent nausea and vomiting after your treatment, we encourage you to take your nausea medication as directed.  If you develop nausea and vomiting that is not controlled by your nausea medication, call the clinic.   BELOW ARE SYMPTOMS THAT SHOULD BE REPORTED IMMEDIATELY:  *FEVER GREATER THAN 100.5 F  *CHILLS WITH OR WITHOUT FEVER  NAUSEA AND VOMITING THAT IS NOT CONTROLLED WITH YOUR NAUSEA MEDICATION  *UNUSUAL SHORTNESS OF BREATH  *UNUSUAL BRUISING OR BLEEDING  TENDERNESS IN MOUTH AND THROAT WITH OR WITHOUT PRESENCE OF ULCERS  *URINARY PROBLEMS  *BOWEL PROBLEMS  UNUSUAL RASH Items with * indicate a potential emergency and should be followed up as soon as possible.  Feel free to call the clinic you have any questions or concerns. The clinic phone number is [phone].  

## 2015-01-05 NOTE — Patient Instructions (Signed)
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
## 2015-01-05 NOTE — Progress Notes (Signed)
[facility] OFFICE PROGRESS NOTE   Diagnosis:  Pancreas cancer, endometrial cancer  INTERVAL HISTORY:   [name] returns as scheduled. She completed cycle 5 gemcitabine 12/21/2014. She has intermittent mild nausea. She estimates that she vomits 1 time a week. No mouth sores. No fever. No rash. No shortness of breath or cough. She describes her appetite as fair. She has noted an increase in diarrhea over the past 2 weeks. The diarrhea tends to occur after eating. She is not on pancreatic enzyme replacement.  Objective:  Vital signs in last 24 hours:  Blood pressure 118/70, pulse 54, temperature 97.5 F (36.4 C), temperature source Oral, resp. rate 18, height 5\' 2"  (1.575 m), weight 148 lb 14.4 oz (67.541 kg), SpO2 99 %.    HEENT: No thrush or ulcers. Resp: Lungs clear bilaterally. Cardio: Regular rate and rhythm. GI: Abdomen soft. Mild tenderness at the right upper abdomen. No hepatomegaly. Vascular: No leg edema. Calves soft and nontender. Skin: No rash. Port-A-Cath without erythema.    Lab Results:  Lab Results  Component Value Date   WBC 3.5* 01/05/2015   HGB 10.3* 01/05/2015   HCT 31.1* 01/05/2015   MCV 91.5 01/05/2015   PLT 230 01/05/2015   NEUTROABS 2.0 01/05/2015    Imaging:  No results found.  Medications: I have reviewed the patient's current medications.  Assessment/Plan: 1. 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  Gemcitabine held 11/02/2014 due to neutropenia.  Gemcitabine 11/09/2014 dose reduced 800 mg/m.  Gemcitabine held 11/16/2014 due to neutropenia.  Gemcitabine resumed 11/23/2014 every 2  week schedule.  Cycle 6 gemcitabine 01/05/2015  2. 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 Admission with post ERCP pancreatitis 06/16/2014  4. History of abdominal pain secondary to #1  5. Pulmonary embolism diagnosed on a CT of the abdomen 09/16/2014  Negative lower extremity Dopplers 09/17/2014  6. Multiple orthopedic surgical procedures  7. Endometrial cancer,stage IA, grade 1 endometrioid adenocarcinoma, 18% myometrial invasion, no lymphovascular space involvement, negative washings  Status post robotic total hysterectomy and bilateral salpingo-oophorectomy 11/30/2010  Recurrent tumor left lateral vagina status post biopsy 11/24/2014 with pathology confirming adenocarcinoma with focal squamous differentiation consistent with endometrial adenocarcinoma  Staging CT scans 12/06/2014 with no evidence of local pancreatic cancer recurrence. Small fluid collection adjacent to the left adrenal gland. Severe hepatic steatosis. No evidence of local extension of endometrial carcinoma. Carcinoma not well-defined at the vaginal cuff. 5 mm right external iliac lymph node. 3.6 mm left external iliac lymph node  Brachytherapy initiated 12/22/2014  8. History of atrial fibrillation-maintained on xarelto  9. Family history of multiple cancers-negative CancerNext gene panel  10. Prolonged nausea following the pancreaticoduodenectomy. Improved 10/26/2014.  11. Port-A-Cath placement 10/21/2014.  12. Neutropenia secondary to chemotherapy 11/02/2014, 11/16/2014.  13. Diarrhea. Question pancreatic insufficiency. Pancreatic enzyme replacement initiated 01/05/2015.    Disposition: [name] appears stable. She has competed 5 cycles of adjuvant gemcitabine. Plan to proceed with cycle 6 today as scheduled. She will return for cycle 7 in 2 weeks. We will see her in follow-up prior to cycle 8 in 4 weeks.   The diarrhea is likely  related to pancreatic insufficiency. We prescribed pancreatic  enzyme replacement at today's visit.  She continues brachytherapy for the endometrial cancer under the direction of Dr. Sondra Come.  Plan reviewed with Dr. [name].  Ned Card ANP/GNP-BC   01/05/2015  9:09 AM

## 2015-01-11 ENCOUNTER — Telehealth: Payer: Self-pay | Admitting: *Deleted

## 2015-01-11 NOTE — Telephone Encounter (Signed)
CALLED PATIENT TO REMIND OF HDR TX. FOR 01-12-15 @ 8:30 AM , SPOKE WITH PATIENT AND SHE IS AWARE OF THIS APPT.

## 2015-01-12 ENCOUNTER — Ambulatory Visit
Admission: RE | Admit: 2015-01-12 | Discharge: 2015-01-12 | Disposition: A | Discharge status: Home / Self Care | Payer: Medicare Other | Source: Ambulatory Visit | Attending: Radiation Oncology | Admitting: Radiation Oncology

## 2015-01-12 DIAGNOSIS — C541 Malignant neoplasm of endometrium: Secondary | ICD-10-CM

## 2015-01-12 DIAGNOSIS — Z510 Encounter for antineoplastic radiation therapy: Secondary | ICD-10-CM | POA: Diagnosis not present

## 2015-01-12 NOTE — Progress Notes (Signed)
  Radiation Oncology         [phone] ________________________________  Name: [name] MRN: [id number]  Date: 01/12/2015  DOB: 04-09-1946     HDR BRACHYTHERAPY  DIAGNOSIS: Recurrent endometrial cancer  Vaginal brachytherapy procedure note  The patient was taken to the high-dose-rate suite and placed in the dorsolithotomy position. A pelvic exam was performed. The vaginal cuff was noted to be intact. No vaginal bleeding was noted in the vaginal vault. Patient proceeded to undergo placement of her custom vaginal cylinder. This was affixed to the CT/MR stabilization plate to prevent slippage. Patient tolerated the procedure well.  Simple treatment device note  Patient had construction of her custom vaginal cylinder. She will be treated with a 3 cm diameter cylinder. More distally will be 2.5 cm rings.  Verification simulation note  A fiducial marker was placed within the vaginal cylinder. The patient proceeded to undergo an AP and lateral film. This was compared to her planning films documenting accurate position of the vaginal cylinder for treatment.  High-dose-rate brachytherapy treatment  The remote afterloading device was attached to the vaginal cylinder by catheter system. The patient then proceeded to undergo her fourth high-dose-rate treatment directed at the vaginal vault. Patient was prescribed a dose of 6 grays to be delivered to the mucosal surface. This was achieved with 1 channel using multiple dwell positions. A treatment length of 6 cm was delivered. Treatment time was 272.6 seconds. The patient tolerated the procedure well. After completion of her therapy a radiation therapy a radiation survey was performed documenting return of the iridium source into the gamma med safe. Iridium 192 was the high-dose-rate source for treatment.  This document serves as a record of services personally performed by Gery Pray, MD. It was created on his behalf by Darcus Austin, a  trained medical scribe. The creation of this record is based on the scribe's personal observations and the provider's statements to them. This document has been checked and approved by the attending provider. ________________________________  Blair Promise, PhD, MD

## 2015-01-15 ENCOUNTER — Other Ambulatory Visit: Payer: Self-pay | Admitting: Oncology

## 2015-01-18 ENCOUNTER — Other Ambulatory Visit (HOSPITAL_BASED_OUTPATIENT_CLINIC_OR_DEPARTMENT_OTHER): Payer: Medicare Other

## 2015-01-18 ENCOUNTER — Ambulatory Visit (HOSPITAL_BASED_OUTPATIENT_CLINIC_OR_DEPARTMENT_OTHER): Payer: Medicare Other

## 2015-01-18 ENCOUNTER — Ambulatory Visit: Payer: Medicare Other

## 2015-01-18 ENCOUNTER — Telehealth: Payer: Self-pay | Admitting: *Deleted

## 2015-01-18 VITALS — BP 135/69 | HR 55 | Temp 97.6°F | Resp 20

## 2015-01-18 DIAGNOSIS — C250 Malignant neoplasm of head of pancreas: Secondary | ICD-10-CM

## 2015-01-18 DIAGNOSIS — Z95828 Presence of other vascular implants and grafts: Secondary | ICD-10-CM

## 2015-01-18 DIAGNOSIS — Z5111 Encounter for antineoplastic chemotherapy: Secondary | ICD-10-CM | POA: Diagnosis not present

## 2015-01-18 LAB — COMPREHENSIVE METABOLIC PANEL (CC13)
ALK PHOS: 101 U/L (ref 40–150)
ALT: 64 U/L — ABNORMAL HIGH (ref 0–55)
AST: 63 U/L — ABNORMAL HIGH (ref 5–34)
Albumin: 3.4 g/dL — ABNORMAL LOW (ref 3.5–5.0)
Anion Gap: 12 mEq/L — ABNORMAL HIGH (ref 3–11)
BUN: 11.6 mg/dL (ref 7.0–26.0)
CO2: 21 mEq/L — ABNORMAL LOW (ref 22–29)
CREATININE: 0.6 mg/dL (ref 0.6–1.1)
Calcium: 8.7 mg/dL (ref 8.4–10.4)
Chloride: 107 mEq/L (ref 98–109)
EGFR: >90 mL/min/{1.73_m2} (ref >90)
Glucose: 125 mg/dl (ref 70–140)
POTASSIUM: 3.7 meq/L (ref 3.5–5.1)
Sodium: 140 mEq/L (ref 136–145)
Total Bilirubin: 0.63 mg/dL (ref 0.20–1.20)
Total Protein: 6.1 g/dL — ABNORMAL LOW (ref 6.4–8.3)

## 2015-01-18 LAB — CBC WITH DIFFERENTIAL/PLATELET
BASO%: 0.7 % (ref 0.0–2.0)
BASOS ABS: 0 10*3/uL (ref 0.0–0.1)
EOS%: 4.2 % (ref 0.0–7.0)
Eosinophils Absolute: 0.1 10*3/uL (ref 0.0–0.5)
HCT: 28.7 % — ABNORMAL LOW (ref 34.8–46.6)
HEMOGLOBIN: 9.6 g/dL — AB (ref 11.6–15.9)
LYMPH#: 0.8 10*3/uL — AB (ref 0.9–3.3)
LYMPH%: 28.2 % (ref 14.0–49.7)
MCH: 31.5 pg (ref 25.1–34.0)
MCHC: 33.4 g/dL (ref 31.5–36.0)
MCV: 94.1 fL (ref 79.5–101.0)
MONO#: 0.4 10*3/uL (ref 0.1–0.9)
MONO%: 13.7 % (ref 0.0–14.0)
NEUT#: 1.5 10*3/uL (ref 1.5–6.5)
NEUT%: 53.2 % (ref 38.4–76.8)
Platelets: 182 10*3/uL (ref 145–400)
RBC: 3.05 10*6/uL — AB (ref 3.70–5.45)
RDW: 18.5 % — ABNORMAL HIGH (ref 11.2–14.5)
WBC: 2.8 10*3/uL — ABNORMAL LOW (ref 3.9–10.3)

## 2015-01-18 MED ORDER — PROCHLORPERAZINE MALEATE 10 MG PO TABS
ORAL_TABLET | ORAL | Status: AC
  Filled 2015-01-18: qty 1

## 2015-01-18 MED ORDER — SODIUM CHLORIDE 0.9 % IJ SOLN
10.0000 mL | INTRAMUSCULAR | Status: DC | PRN
  Administered 2015-01-18: 10 mL
  Filled 2015-01-18: qty 10

## 2015-01-18 MED ORDER — SODIUM CHLORIDE 0.9 % IV SOLN
Freq: Once | INTRAVENOUS | Status: AC
  Administered 2015-01-18: 10:00:00 via INTRAVENOUS

## 2015-01-18 MED ORDER — SODIUM CHLORIDE 0.9 % IJ SOLN
10.0000 mL | INTRAMUSCULAR | Status: DC | PRN
Start: 2015-01-18 — End: 2015-01-18
  Administered 2015-01-18: 10 mL via INTRAVENOUS
  Filled 2015-01-18: qty 10

## 2015-01-18 MED ORDER — PROCHLORPERAZINE MALEATE 10 MG PO TABS
10.0000 mg | ORAL_TABLET | Freq: Once | ORAL | Status: AC
  Administered 2015-01-18: 10 mg via ORAL

## 2015-01-18 MED ORDER — HEPARIN SOD (PORK) LOCK FLUSH 100 UNIT/ML IV SOLN
500.0000 [IU] | Freq: Once | INTRAVENOUS | Status: AC | PRN
  Administered 2015-01-18: 500 [IU]
  Filled 2015-01-18: qty 5

## 2015-01-18 MED ORDER — SODIUM CHLORIDE 0.9 % IV SOLN
800.0000 mg/m2 | Freq: Once | INTRAVENOUS | Status: AC
  Administered 2015-01-18: 1482 mg via INTRAVENOUS
  Filled 2015-01-18: qty 38.98

## 2015-01-18 NOTE — Patient Instructions (Signed)

## 2015-01-18 NOTE — Patient Instructions (Signed)
[facility] Discharge Instructions for Patients Receiving Chemotherapy  Today you received the following chemotherapy agents gemzar  To help prevent nausea and vomiting after your treatment, we encourage you to take your nausea medication as directed.  If you develop nausea and vomiting that is not controlled by your nausea medication, call the clinic.   BELOW ARE SYMPTOMS THAT SHOULD BE REPORTED IMMEDIATELY:  *FEVER GREATER THAN 100.5 F  *CHILLS WITH OR WITHOUT FEVER  NAUSEA AND VOMITING THAT IS NOT CONTROLLED WITH YOUR NAUSEA MEDICATION  *UNUSUAL SHORTNESS OF BREATH  *UNUSUAL BRUISING OR BLEEDING  TENDERNESS IN MOUTH AND THROAT WITH OR WITHOUT PRESENCE OF ULCERS  *URINARY PROBLEMS  *BOWEL PROBLEMS  UNUSUAL RASH Items with * indicate a potential emergency and should be followed up as soon as possible.  Feel free to call the clinic you have any questions or concerns. The clinic phone number is [phone].  

## 2015-01-18 NOTE — Telephone Encounter (Signed)
Called patient to remind of HDR Tx. For 01-19-15 @ 8:30 am , lvm for a return call

## 2015-01-18 NOTE — Progress Notes (Signed)
Patient has had some loose stools but this is her normal pattern.  Patient also started with vertigo yesterday, it is better today.  She has had this before and Dr . [name] aware.

## 2015-01-19 ENCOUNTER — Encounter: Payer: Self-pay | Admitting: Radiation Oncology

## 2015-01-19 ENCOUNTER — Telehealth: Payer: Self-pay | Admitting: Oncology

## 2015-01-19 ENCOUNTER — Ambulatory Visit
Admission: RE | Admit: 2015-01-19 | Discharge: 2015-01-19 | Disposition: A | Discharge status: Home / Self Care | Payer: Medicare Other | Source: Ambulatory Visit | Attending: Radiation Oncology | Admitting: Radiation Oncology

## 2015-01-19 DIAGNOSIS — C541 Malignant neoplasm of endometrium: Secondary | ICD-10-CM

## 2015-01-19 DIAGNOSIS — Z510 Encounter for antineoplastic radiation therapy: Secondary | ICD-10-CM | POA: Diagnosis not present

## 2015-01-19 MED ORDER — MECLIZINE HCL 32 MG PO TABS: 32.0000 mg | ORAL_TABLET | Freq: Three times a day (TID) | ORAL | Status: DC | PRN

## 2015-01-19 NOTE — Telephone Encounter (Signed)
Called [name] and advised her that Dr. Sondra Come has sent in a prescription for meclizine 25 mg for her vertigo to [facility].  Let her know that she can take it 3 times a day as needed.  [name] verbalized understanding.

## 2015-01-19 NOTE — Addendum Note (Signed)
Encounter addended by: Gery Pray, MD on: 01/19/2015  1:21 PM<BR>     Documentation filed: Orders

## 2015-01-19 NOTE — Progress Notes (Signed)
  Radiation Oncology         [phone] ________________________________  Name: [name] MRN: [id number]  Date: 01/19/2015  DOB: 1946-03-29     HDR BRACHYTHERAPY  DIAGNOSIS: Recurrent endometrial cancer  Vaginal brachytherapy procedure note  The patient was taken to the high-dose-rate suite and placed in the dorsolithotomy position. A pelvic exam was performed. The vaginal cuff was noted to be intact. No vaginal bleeding was noted in the vaginal vault. Patient proceeded to undergo placement of her custom vaginal cylinder. This was affixed to the CT/MR stabilization plate to prevent slippage. Patient tolerated the procedure well.  Simple treatment device note  Patient had construction of her custom vaginal cylinder. She will be treated with a 3 cm diameter cylinder. More distally will be 2.5 cm rings.  Verification simulation note  A fiducial marker was placed within the vaginal cylinder. The patient proceeded to undergo an AP and lateral film. This was compared to her planning films documenting accurate position of the vaginal cylinder for treatment.  High-dose-rate brachytherapy treatment  The remote afterloading device was attached to the vaginal cylinder by catheter system. The patient then proceeded to undergo her fifth high-dose-rate treatment directed at the vaginal vault. Patient was prescribed a dose of 6 Gy to be delivered to the mucosal surface. This was achieved with 1 channel using multiple dwell positions. A treatment length of 6 cm was delivered. Treatment time was 290.8 seconds. The patient tolerated the procedure well. After completion of her therapy a radiation therapy a radiation survey was performed documenting return of the iridium source into the gamma med safe. Iridium 192 was the high-dose-rate source for treatment. No bleeding noted on the vaginal cylinder at the time of removal.  This document serves as a record of services personally performed by  Gery Pray, MD. It was created on his behalf by Arlyce Harman, a trained medical scribe. The creation of this record is based on the scribe's personal observations and the provider's statements to them. This document has been checked and approved by the attending provider.  ________________________________  Blair Promise, PhD, MD

## 2015-01-26 ENCOUNTER — Other Ambulatory Visit: Payer: Self-pay | Admitting: Cardiology

## 2015-01-26 NOTE — Progress Notes (Signed)
  Radiation Oncology         [phone] ________________________________  Name: [name] MRN: [id number]  Date: 01/19/2015  DOB: September 16, 1946  End of Treatment Note  ICD-9-CM ICD-10-CM     1. 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   On going nausea related to pancreatic cancer and chemotherapy. Vaginal bleeding stopped early during course of brachytherapy tx  Plan: The patient has completed radiation treatment. The patient will return to radiation oncology clinic for routine followup in one month. I advised them to call or return sooner if they have any questions or concerns related to their recovery or treatment.  -----------------------------------  Blair Promise, PhD, MD

## 2015-01-28 ENCOUNTER — Other Ambulatory Visit: Payer: Self-pay | Admitting: Oncology

## 2015-02-01 ENCOUNTER — Telehealth: Payer: Self-pay | Admitting: Oncology

## 2015-02-01 ENCOUNTER — Ambulatory Visit (HOSPITAL_BASED_OUTPATIENT_CLINIC_OR_DEPARTMENT_OTHER): Payer: Medicare Other | Admitting: Oncology

## 2015-02-01 ENCOUNTER — Other Ambulatory Visit (HOSPITAL_BASED_OUTPATIENT_CLINIC_OR_DEPARTMENT_OTHER): Payer: Medicare Other

## 2015-02-01 ENCOUNTER — Ambulatory Visit: Payer: Medicare Other | Admitting: Nutrition

## 2015-02-01 ENCOUNTER — Telehealth: Payer: Self-pay | Admitting: *Deleted

## 2015-02-01 ENCOUNTER — Ambulatory Visit: Payer: Medicare Other

## 2015-02-01 ENCOUNTER — Encounter: Payer: Self-pay | Admitting: *Deleted

## 2015-02-01 ENCOUNTER — Ambulatory Visit (HOSPITAL_BASED_OUTPATIENT_CLINIC_OR_DEPARTMENT_OTHER): Payer: Medicare Other

## 2015-02-01 VITALS — BP 121/60 | HR 57 | Temp 97.6°F | Resp 18 | Ht 62.0 in | Wt 150.7 lb

## 2015-02-01 DIAGNOSIS — Z5111 Encounter for antineoplastic chemotherapy: Secondary | ICD-10-CM | POA: Diagnosis not present

## 2015-02-01 DIAGNOSIS — C250 Malignant neoplasm of head of pancreas: Secondary | ICD-10-CM | POA: Diagnosis not present

## 2015-02-01 DIAGNOSIS — D701 Agranulocytosis secondary to cancer chemotherapy: Secondary | ICD-10-CM

## 2015-02-01 DIAGNOSIS — Z95828 Presence of other vascular implants and grafts: Secondary | ICD-10-CM

## 2015-02-01 DIAGNOSIS — C541 Malignant neoplasm of endometrium: Secondary | ICD-10-CM

## 2015-02-01 LAB — COMPREHENSIVE METABOLIC PANEL (CC13)
ALT: 60 U/L — ABNORMAL HIGH (ref 0–55)
AST: 59 U/L — AB (ref 5–34)
Albumin: 3.6 g/dL (ref 3.5–5.0)
Alkaline Phosphatase: 108 U/L (ref 40–150)
Anion Gap: 9 mEq/L (ref 3–11)
BUN: 11.3 mg/dL (ref 7.0–26.0)
CALCIUM: 8.9 mg/dL (ref 8.4–10.4)
CO2: 23 mEq/L (ref 22–29)
Chloride: 105 mEq/L (ref 98–109)
Creatinine: 0.7 mg/dL (ref 0.6–1.1)
GLUCOSE: 119 mg/dL (ref 70–140)
Potassium: 4 mEq/L (ref 3.5–5.1)
Sodium: 138 mEq/L (ref 136–145)
Total Bilirubin: 0.56 mg/dL (ref 0.20–1.20)
Total Protein: 6.4 g/dL (ref 6.4–8.3)

## 2015-02-01 LAB — CBC WITH DIFFERENTIAL/PLATELET
BASO%: 1.1 % (ref 0.0–2.0)
BASOS ABS: 0 10*3/uL (ref 0.0–0.1)
EOS%: 4.4 % (ref 0.0–7.0)
Eosinophils Absolute: 0.1 10*3/uL (ref 0.0–0.5)
HEMATOCRIT: 29.8 % — AB (ref 34.8–46.6)
HEMOGLOBIN: 10 g/dL — AB (ref 11.6–15.9)
LYMPH#: 1 10*3/uL (ref 0.9–3.3)
LYMPH%: 30.7 % (ref 14.0–49.7)
MCH: 31.9 pg (ref 25.1–34.0)
MCHC: 33.5 g/dL (ref 31.5–36.0)
MCV: 95 fL (ref 79.5–101.0)
MONO#: 0.5 10*3/uL (ref 0.1–0.9)
MONO%: 14.2 % — ABNORMAL HIGH (ref 0.0–14.0)
NEUT#: 1.7 10*3/uL (ref 1.5–6.5)
NEUT%: 49.6 % (ref 38.4–76.8)
PLATELETS: 211 10*3/uL (ref 145–400)
RBC: 3.13 10*6/uL — ABNORMAL LOW (ref 3.70–5.45)
RDW: 17 % — ABNORMAL HIGH (ref 11.2–14.5)
WBC: 3.4 10*3/uL — ABNORMAL LOW (ref 3.9–10.3)

## 2015-02-01 MED ORDER — PROCHLORPERAZINE MALEATE 10 MG PO TABS
10.0000 mg | ORAL_TABLET | Freq: Once | ORAL | Status: AC
  Administered 2015-02-01: 10 mg via ORAL

## 2015-02-01 MED ORDER — HEPARIN SOD (PORK) LOCK FLUSH 100 UNIT/ML IV SOLN
500.0000 [IU] | Freq: Once | INTRAVENOUS | Status: AC | PRN
  Administered 2015-02-01: 500 [IU]
  Filled 2015-02-01: qty 5

## 2015-02-01 MED ORDER — SODIUM CHLORIDE 0.9 % IV SOLN
800.0000 mg/m2 | Freq: Once | INTRAVENOUS | Status: AC
  Administered 2015-02-01: 1482 mg via INTRAVENOUS
  Filled 2015-02-01: qty 38.98

## 2015-02-01 MED ORDER — PROCHLORPERAZINE MALEATE 10 MG PO TABS
ORAL_TABLET | ORAL | Status: AC
  Filled 2015-02-01: qty 1

## 2015-02-01 MED ORDER — SODIUM CHLORIDE 0.9 % IV SOLN
Freq: Once | INTRAVENOUS | Status: AC
  Administered 2015-02-01: 10:00:00 via INTRAVENOUS

## 2015-02-01 MED ORDER — SODIUM CHLORIDE 0.9 % IJ SOLN
10.0000 mL | INTRAMUSCULAR | Status: DC | PRN
  Administered 2015-02-01: 10 mL
  Filled 2015-02-01: qty 10

## 2015-02-01 MED ORDER — SODIUM CHLORIDE 0.9 % IJ SOLN
10.0000 mL | INTRAMUSCULAR | Status: DC | PRN
  Administered 2015-02-01: 10 mL via INTRAVENOUS
  Filled 2015-02-01: qty 10

## 2015-02-01 NOTE — Patient Instructions (Signed)
[facility] Discharge Instructions for Patients Receiving Chemotherapy  Today you received the following chemotherapy agents: gemzar  To help prevent nausea and vomiting after your treatment, we encourage you to take your nausea medication.  Take it as often as prescribed.     If you develop nausea and vomiting that is not controlled by your nausea medication, call the clinic. If it is after clinic hours your family physician or the after hours number for the clinic or go to the Emergency Department.   BELOW ARE SYMPTOMS THAT SHOULD BE REPORTED IMMEDIATELY:  *FEVER GREATER THAN 100.5 F  *CHILLS WITH OR WITHOUT FEVER  NAUSEA AND VOMITING THAT IS NOT CONTROLLED WITH YOUR NAUSEA MEDICATION  *UNUSUAL SHORTNESS OF BREATH  *UNUSUAL BRUISING OR BLEEDING  TENDERNESS IN MOUTH AND THROAT WITH OR WITHOUT PRESENCE OF ULCERS  *URINARY PROBLEMS  *BOWEL PROBLEMS  UNUSUAL RASH Items with * indicate a potential emergency and should be followed up as soon as possible.  Feel free to call the clinic you have any questions or concerns. The clinic phone number is [phone].   I have been informed and understand all the instructions given to me. I know to contact the clinic, my physician, or go to the Emergency Department if any problems should occur. I do not have any questions at this time, but understand that I may call the clinic during office hours   should I have any questions or need assistance in obtaining follow up care.    __________________________________________  _____________  __________ Signature of Patient or Authorized Representative            Date                   Time    __________________________________________ Nurse's Signature    

## 2015-02-01 NOTE — Telephone Encounter (Signed)
Per staff message and POF I have scheduled appts. Advised scheduler of appts. JMW  

## 2015-02-01 NOTE — Progress Notes (Signed)
Nutrition follow-up completed with patient during chemotherapy for cancer of the pancreas.   Patient is status post Whipple surgery on December 8. Current weight documented as 150.7 pounds May 11 down from 163.8 pounds February 10. Patient reports she is eating more.   She will drink an occasional boost plus. Patient denies nausea, vomiting and diarrhea. She has occasional constipation but takes MiraLAX with good relief. Patient denies nutrition needs at this time.  Nutrition diagnosis: Food and nutrition related knowledge deficit improved.  Intervention:  Patient educated to continue small, frequent meals with high-calorie, high-protein foods to promote weight stabilization. Offered coupons.  However, patient declines at this time. Teach back method was used.  Monitoring, evaluation, goals: Patient will tolerate adequate calories and protein to promote weight stabilization.  Next visit: Encouraged patient to contact me with further questions or concerns.  **Disclaimer: This note was dictated with voice recognition software. Similar sounding words can inadvertently be transcribed and this note may contain transcription errors which may not have been corrected upon publication of note.**

## 2015-02-01 NOTE — Progress Notes (Signed)
[name] OFFICE PROGRESS NOTE   Diagnosis: Pancreas cancer  INTERVAL HISTORY:   [name] returns as scheduled. She completed vaginal radiation 01/19/2015. She has developed positional vertigo. This occurs daily and last for approximate 10 minutes. She develops vertigo when bending over. Dr. Sondra Come prescribed meclizine and this helps partially. She reports a good appetite. No diarrhea. She continues pancreatic enzyme replacement.  No vertigo at present  Objective:  Vital signs in last 24 hours:  Blood pressure 121/60, pulse 57, temperature 97.6 F (36.4 C), temperature source Oral, resp. rate 18, height 5\' 2"  (1.575 m), weight 150 lb 11.2 oz (68.357 kg), SpO2 100 %.    HEENT: No thrush or ulcers Resp: Lungs clear bilaterally Cardio: Regular rate and rhythm GI: No hepatomegaly, no mass, mild diffuse tenderness Vascular: No leg edema Neurologic: The extraocular movements appear intact, alert and oriented Portacath/PICC-without erythema  Lab Results:  Lab Results  Component Value Date   WBC 3.4* 02/01/2015   HGB 10.0* 02/01/2015   HCT 29.8* 02/01/2015   MCV 95.0 02/01/2015   PLT 211 02/01/2015   NEUTROABS 1.7 02/01/2015     Medications: I have reviewed the patient's current medications.  Assessment/Plan: 1. 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  Gemcitabine held 11/02/2014 due to neutropenia.  Gemcitabine 11/09/2014 dose reduced 800 mg/m.  Gemcitabine held 11/16/2014 due to neutropenia.  Gemcitabine resumed 11/23/2014 every 2 week schedule.  Cycle 6 gemcitabine 01/05/2015  Cycle 7 gemcitabine  01/18/2015  Cycle 8 gemcitabine 02/01/2015  2. 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 Admission with post ERCP pancreatitis 06/16/2014  4. History of abdominal pain secondary to #1  5. Pulmonary embolism diagnosed on a CT of the abdomen 09/16/2014  Negative lower extremity Dopplers 09/17/2014  6. Multiple orthopedic surgical procedures  7. Endometrial cancer,stage IA, grade 1 endometrioid adenocarcinoma, 18% myometrial invasion, no lymphovascular space involvement, negative washings  Status post robotic total hysterectomy and bilateral salpingo-oophorectomy 11/30/2010  Recurrent tumor left lateral vagina status post biopsy 11/24/2014 with pathology confirming adenocarcinoma with focal squamous differentiation consistent with endometrial adenocarcinoma  Staging CT scans 12/06/2014 with no evidence of local pancreatic cancer recurrence. Small fluid collection adjacent to the left adrenal gland. Severe hepatic steatosis. No evidence of local extension of endometrial carcinoma. Carcinoma not well-defined at the vaginal cuff. 5 mm right external iliac lymph node. 3.6 mm left external iliac lymph node  Brachytherapy initiated 12/22/2014, completed 01/19/2015  8. History of atrial fibrillation-maintained on xarelto  9. Family history of multiple cancers-negative CancerNext gene panel  10. Prolonged nausea following the pancreaticoduodenectomy. Improved 10/26/2014.  11. Port-A-Cath placement 10/21/2014.  12. Neutropenia secondary to chemotherapy 11/02/2014, 11/16/2014.  13. Diarrhea. Question pancreatic insufficiency. Pancreatic enzyme replacement initiated 01/05/2015.  14. 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 We will make a neurology referral if this persists.    Disposition:  [name] appears to be tolerating the  gemcitabine well. The plan is to continue with adjuvant gemcitabine on a 2 week schedule. She will return for an office visit and chemotherapy in 2 weeks. She will contact us for persistent vertigo symptoms and we  will make a neurology referral to consider tilt table therapy.  Betsy Coder, MD  02/01/2015  9:26 AM

## 2015-02-01 NOTE — Telephone Encounter (Signed)
Pt confirmed labs/ov per 05/11 POF, gave pt AVS and Calendar.... KJ, sent msg to add chemo °

## 2015-02-01 NOTE — Patient Instructions (Signed)

## 2015-02-01 NOTE — [facility] Oncology Navigator Note (Signed)
Met with patient and her daughter today during follow up MD visit. She reports feeling well in general. Has enjoyed when she is able to work outside. Just started her small garden this week. Becomes tearful easily when talking about her cancer diagnosis, but says she does not stay tearful or depressed for very long. Denies any barriers to care or unmet needs at this time. Provided her with contact information.  Merceda Elks, RN GI Oncology Navigator

## 2015-02-12 ENCOUNTER — Other Ambulatory Visit: Payer: Self-pay | Admitting: Oncology

## 2015-02-15 ENCOUNTER — Telehealth: Payer: Self-pay | Admitting: Oncology

## 2015-02-15 ENCOUNTER — Ambulatory Visit: Payer: Medicare Other

## 2015-02-15 ENCOUNTER — Telehealth: Payer: Self-pay | Admitting: *Deleted

## 2015-02-15 ENCOUNTER — Other Ambulatory Visit: Payer: Self-pay | Admitting: *Deleted

## 2015-02-15 ENCOUNTER — Ambulatory Visit (HOSPITAL_BASED_OUTPATIENT_CLINIC_OR_DEPARTMENT_OTHER): Payer: Medicare Other | Admitting: Oncology

## 2015-02-15 ENCOUNTER — Ambulatory Visit (HOSPITAL_BASED_OUTPATIENT_CLINIC_OR_DEPARTMENT_OTHER): Payer: Medicare Other

## 2015-02-15 ENCOUNTER — Other Ambulatory Visit (HOSPITAL_BASED_OUTPATIENT_CLINIC_OR_DEPARTMENT_OTHER): Payer: Medicare Other

## 2015-02-15 VITALS — BP 102/68 | HR 47 | Temp 97.8°F | Resp 18 | Ht 62.0 in | Wt 152.0 lb

## 2015-02-15 DIAGNOSIS — D701 Agranulocytosis secondary to cancer chemotherapy: Secondary | ICD-10-CM

## 2015-02-15 DIAGNOSIS — C541 Malignant neoplasm of endometrium: Secondary | ICD-10-CM | POA: Diagnosis not present

## 2015-02-15 DIAGNOSIS — Z5111 Encounter for antineoplastic chemotherapy: Secondary | ICD-10-CM

## 2015-02-15 DIAGNOSIS — I4891 Unspecified atrial fibrillation: Secondary | ICD-10-CM | POA: Diagnosis not present

## 2015-02-15 DIAGNOSIS — C250 Malignant neoplasm of head of pancreas: Secondary | ICD-10-CM | POA: Diagnosis not present

## 2015-02-15 DIAGNOSIS — Z95828 Presence of other vascular implants and grafts: Secondary | ICD-10-CM

## 2015-02-15 DIAGNOSIS — H8149 Vertigo of central origin, unspecified ear: Secondary | ICD-10-CM

## 2015-02-15 DIAGNOSIS — R229 Localized swelling, mass and lump, unspecified: Secondary | ICD-10-CM

## 2015-02-15 LAB — COMPREHENSIVE METABOLIC PANEL (CC13)
ALBUMIN: 3.6 g/dL (ref 3.5–5.0)
ALK PHOS: 101 U/L (ref 40–150)
ALT: 61 U/L — AB (ref 0–55)
AST: 57 U/L — ABNORMAL HIGH (ref 5–34)
Anion Gap: 9 mEq/L (ref 3–11)
BUN: 11.7 mg/dL (ref 7.0–26.0)
CALCIUM: 8.2 mg/dL — AB (ref 8.4–10.4)
CO2: 23 meq/L (ref 22–29)
CREATININE: 0.7 mg/dL (ref 0.6–1.1)
Chloride: 104 mEq/L (ref 98–109)
EGFR: >90 mL/min/{1.73_m2} (ref >90)
Glucose: 101 mg/dl (ref 70–140)
Potassium: 4.4 mEq/L (ref 3.5–5.1)
Sodium: 136 mEq/L (ref 136–145)
TOTAL PROTEIN: 6.4 g/dL (ref 6.4–8.3)
Total Bilirubin: 0.47 mg/dL (ref 0.20–1.20)

## 2015-02-15 LAB — CBC WITH DIFFERENTIAL/PLATELET
BASO%: 1 % (ref 0.0–2.0)
BASOS ABS: 0 10*3/uL (ref 0.0–0.1)
EOS%: 3.2 % (ref 0.0–7.0)
Eosinophils Absolute: 0.1 10*3/uL (ref 0.0–0.5)
HEMATOCRIT: 29.4 % — AB (ref 34.8–46.6)
HGB: 10 g/dL — ABNORMAL LOW (ref 11.6–15.9)
LYMPH#: 1.3 10*3/uL (ref 0.9–3.3)
LYMPH%: 32.8 % (ref 14.0–49.7)
MCH: 31.8 pg (ref 25.1–34.0)
MCHC: 34 g/dL (ref 31.5–36.0)
MCV: 93.6 fL (ref 79.5–101.0)
MONO#: 0.4 10*3/uL (ref 0.1–0.9)
MONO%: 10.3 % (ref 0.0–14.0)
NEUT#: 2.2 10*3/uL (ref 1.5–6.5)
NEUT%: 52.7 % (ref 38.4–76.8)
PLATELETS: 179 10*3/uL (ref 145–400)
RBC: 3.14 10*6/uL — ABNORMAL LOW (ref 3.70–5.45)
RDW: 14.7 % — AB (ref 11.2–14.5)
WBC: 4.1 10*3/uL (ref 3.9–10.3)
nRBC: 0 % (ref 0–0)

## 2015-02-15 MED ORDER — SODIUM CHLORIDE 0.9 % IJ SOLN
10.0000 mL | INTRAMUSCULAR | Status: DC | PRN
  Administered 2015-02-15: 10 mL
  Filled 2015-02-15: qty 10

## 2015-02-15 MED ORDER — SODIUM CHLORIDE 0.9 % IJ SOLN
10.0000 mL | INTRAMUSCULAR | Status: DC | PRN
  Administered 2015-02-15: 10 mL via INTRAVENOUS
  Filled 2015-02-15: qty 10

## 2015-02-15 MED ORDER — PROCHLORPERAZINE MALEATE 10 MG PO TABS
10.0000 mg | ORAL_TABLET | Freq: Once | ORAL | Status: AC
  Administered 2015-02-15: 10 mg via ORAL

## 2015-02-15 MED ORDER — GEMCITABINE HCL CHEMO INJECTION 1 GM/26.3ML
800.0000 mg/m2 | Freq: Once | INTRAVENOUS | Status: AC
  Administered 2015-02-15: 1482 mg via INTRAVENOUS
  Filled 2015-02-15: qty 38.98

## 2015-02-15 MED ORDER — LORAZEPAM 1 MG PO TABS: 0.5000 mg | ORAL_TABLET | Freq: Three times a day (TID) | ORAL | Status: DC | PRN

## 2015-02-15 MED ORDER — PROCHLORPERAZINE MALEATE 10 MG PO TABS
ORAL_TABLET | ORAL | Status: AC
  Filled 2015-02-15: qty 1

## 2015-02-15 MED ORDER — SODIUM CHLORIDE 0.9 % IV SOLN
Freq: Once | INTRAVENOUS | Status: AC
Start: 2015-02-15 — End: 2015-02-15
  Administered 2015-02-15: 13:00:00 via INTRAVENOUS

## 2015-02-15 MED ORDER — HEPARIN SOD (PORK) LOCK FLUSH 100 UNIT/ML IV SOLN
500.0000 [IU] | Freq: Once | INTRAVENOUS | Status: AC | PRN
  Administered 2015-02-15: 500 [IU]
  Filled 2015-02-15: qty 5

## 2015-02-15 NOTE — Progress Notes (Signed)
[place] OFFICE PROGRESS NOTE   Diagnosis: Pancreas cancer  INTERVAL HISTORY:   [name] returns as scheduled. She completed another treatment with gemcitabine 02/01/2015. No nausea, fever, or rash following chemotherapy. She reports mild hair loss. She has noted a "knot "at the left buttock for the past several years. This feels larger. She continues to have positional vertigo.  Objective:  Vital signs in last 24 hours:  Blood pressure 102/68, pulse 47, temperature 97.8 F (36.6 C), temperature source Oral, resp. rate 18, height 5\' 2"  (1.575 m), weight 152 lb (68.947 kg), SpO2 100 %.    HEENT: No thrush or ulcers Resp: Lungs clear bilaterally Cardio: Regular rate and rhythm GI: No hepatomegaly, mild diffuse tenderness, no mass Vascular: No leg edema Musculoskeletal: Soft mobile 3 cm cutaneous mass at the left lateral buttock    Portacath/PICC-without erythema  Lab Results:  Lab Results  Component Value Date   WBC 4.1 02/15/2015   HGB 10.0* 02/15/2015   HCT 29.4* 02/15/2015   MCV 93.6 02/15/2015   PLT 179 02/15/2015   NEUTROABS 2.2 02/15/2015   CA 19-9 on 12/07/2014-115  Medications: I have reviewed the patient's current medications.  Assessment/Plan: 1. 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  Gemcitabine held 11/02/2014 due to neutropenia.  Gemcitabine 11/09/2014 dose reduced 800 mg/m.  Gemcitabine held 11/16/2014 due to neutropenia.  Gemcitabine resumed 11/23/2014 every 2 week schedule.  Cycle 6 gemcitabine 01/05/2015  Cycle 7 gemcitabine 01/18/2015  Cycle 8 gemcitabine 02/01/2015  Cycle 9  gemcitabine 02/15/2015  2. 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 Admission with post ERCP pancreatitis 06/16/2014  4. History of abdominal pain secondary to #1  5. Pulmonary embolism diagnosed on a CT of the abdomen 09/16/2014  Negative lower extremity Dopplers 09/17/2014  6. Multiple orthopedic surgical procedures  7. Endometrial cancer,stage IA, grade 1 endometrioid adenocarcinoma, 18% myometrial invasion, no lymphovascular space involvement, negative washings  Status post robotic total hysterectomy and bilateral salpingo-oophorectomy 11/30/2010  Recurrent tumor left lateral vagina status post biopsy 11/24/2014 with pathology confirming adenocarcinoma with focal squamous differentiation consistent with endometrial adenocarcinoma  Staging CT scans 12/06/2014 with no evidence of local pancreatic cancer recurrence. Small fluid collection adjacent to the left adrenal gland. Severe hepatic steatosis. No evidence of local extension of endometrial carcinoma. Carcinoma not well-defined at the vaginal cuff. 5 mm right external iliac lymph node. 3.6 mm left external iliac lymph node  Brachytherapy initiated 12/22/2014, completed 01/19/2015  8. History of atrial fibrillation-maintained on xarelto  9. Family history of multiple cancers-negative CancerNext gene panel  10. Prolonged nausea following the pancreaticoduodenectomy. Improved 10/26/2014.  11. Port-A-Cath placement 10/21/2014.  12. Neutropenia secondary to chemotherapy 11/02/2014, 11/16/2014.  13. Diarrhea. Question pancreatic insufficiency. Pancreatic enzyme replacement initiated 01/05/2015.  14. 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 We will make a neurology referral if this persists.     Disposition:  [name] continues to tolerate the gemcitabine well. She will  complete another treatment today. We will follow-up on the CA 19-9 from today. She will return for an office visit and chemotherapy in 2 weeks. The plan is to complete  a total of 12 treatments with gemcitabine. 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  Betsy Coder, MD  02/15/2015  11:54 AM

## 2015-02-15 NOTE — Telephone Encounter (Signed)
Pt confirmed labs/ov per 05/25 POF, gave pt AVS and Calendar.... KJ, sent msg to add chemo °

## 2015-02-15 NOTE — Patient Instructions (Signed)
[facility] Discharge Instructions for Patients Receiving Chemotherapy  Today you received the following chemotherapy agents Gemzar  To help prevent nausea and vomiting after your treatment, we encourage you to take your nausea medication as directed.    If you develop nausea and vomiting that is not controlled by your nausea medication, call the clinic.   BELOW ARE SYMPTOMS THAT SHOULD BE REPORTED IMMEDIATELY:  *FEVER GREATER THAN 100.5 F  *CHILLS WITH OR WITHOUT FEVER  NAUSEA AND VOMITING THAT IS NOT CONTROLLED WITH YOUR NAUSEA MEDICATION  *UNUSUAL SHORTNESS OF BREATH  *UNUSUAL BRUISING OR BLEEDING  TENDERNESS IN MOUTH AND THROAT WITH OR WITHOUT PRESENCE OF ULCERS  *URINARY PROBLEMS  *BOWEL PROBLEMS  UNUSUAL RASH Items with * indicate a potential emergency and should be followed up as soon as possible.  Feel free to call the clinic you have any questions or concerns. The clinic phone number is [phone].  Please show the CHEMO ALERT CARD at check-in to the Emergency Department and triage nurse.   

## 2015-02-15 NOTE — Telephone Encounter (Signed)
Per staff message and POF I have scheduled appts. Advised scheduler of appts. JMW  

## 2015-02-15 NOTE — Patient Instructions (Signed)

## 2015-02-16 LAB — CANCER ANTIGEN 19-9: CA 19-9: 24.6 U/mL (ref <35.0)

## 2015-02-21 ENCOUNTER — Encounter: Payer: Self-pay | Admitting: Oncology

## 2015-02-25 ENCOUNTER — Other Ambulatory Visit: Payer: Self-pay | Admitting: Oncology

## 2015-02-27 ENCOUNTER — Ambulatory Visit: Payer: Medicare Other | Admitting: Radiation Oncology

## 2015-02-28 ENCOUNTER — Ambulatory Visit
Admission: RE | Admit: 2015-02-28 | Discharge: 2015-02-28 | Disposition: A | Discharge status: Home / Self Care | Payer: Medicare Other | Source: Ambulatory Visit | Attending: Radiation Oncology | Admitting: Radiation Oncology

## 2015-02-28 ENCOUNTER — Encounter: Payer: Self-pay | Admitting: Radiation Oncology

## 2015-02-28 VITALS — BP 105/60 | HR 51 | Temp 97.7°F | Resp 16 | Ht 62.0 in | Wt 154.3 lb

## 2015-02-28 DIAGNOSIS — C541 Malignant neoplasm of endometrium: Secondary | ICD-10-CM

## 2015-02-28 NOTE — Progress Notes (Signed)
[name] here for follow up.  She denies pain but reports her stomach has been bothering her.  Dr. Barry Dienes has increased her creon to 36000 units to help with this.  She denies bladder and bowel issues, vaginal bleeding or discharge.  She also denies nausea.  She will having her last chemotherapy on 03/29/15.  BP 105/60 mmHg  Pulse 51  Temp(Src) 97.7 F (36.5 C) (Oral)  Resp 16  Ht 5\' 2"  (1.575 m)  Wt 154 lb 4.8 oz (69.99 kg)  BMI 28.21 kg/m2  SpO2 100%

## 2015-02-28 NOTE — Progress Notes (Signed)
Patient was given a size M vaginal dilator and lubricant.  She was advised to apply the lubricant to the dilator and to insert for 10 minutes 3 times a week and then to wash the dilator with soap and water after use.  Anilah verbalized agreement and understanding in teach back mode.

## 2015-02-28 NOTE — Progress Notes (Signed)
Radiation Oncology         [phone] ________________________________  Name: [name] MRN: [id number]  Date: 02/28/2015  DOB: 19-Feb-1946  Follow-Up Visit Note  CC: Garth Bigness, MD    ICD-9-CM ICD-10-CM   1. Endometrial ca 182.0 C54.1     Diagnosis:   Recurrent endometrial cancer  Interval Since Last Radiation:  39  days  Narrative:  The patient returns today for routine follow-up.  She denies any further problems with nausea. She does have some upper abdominal pain with eating but has had increase in her pancreatic enzymes to address this issue. She denies any pelvic pain or vaginal bleeding. Patient denies any hematuria or rectal bleeding.                           ALLERGIES:  is allergic to ace inhibitors; codeine; scopolamine; and sulfa antibiotics.  Meds: Current Outpatient Prescriptions  Medication Sig Dispense Refill  . acetaminophen (TYLENOL) 500 MG tablet Take 1,000 mg by mouth every 6 (six) hours as needed for pain.     Marland Kitchen dextromethorphan-guaiFENesin (MUCINEX DM) 30-600 MG per 12 hr tablet Take 1 tablet by mouth 3 (three) times daily as needed. 40 tablet 0  . Eszopiclone 3 MG TABS Take 1.5 mg by mouth at bedtime.     . flecainide (TAMBOCOR) 100 MG tablet TAKE 1 TABLET BY MOUTH TWICE A DAY 180 tablet 1  . fluticasone (FLONASE) 50 MCG/ACT nasal spray Place 1 spray into both nostrils 2 (two) times daily as needed.     . lidocaine-prilocaine (EMLA) cream Apply small amount over port area 1-2 hours prior to treatment and cover with plastic wrap.  DO NOT RUB IN. 30 g prn  . lipase/protease/amylase (CREON) 12000 UNITS CPEP capsule Take 2 capsules (24,000 Units total) by mouth 3 (three) times daily before meals. 180 capsule 2  . LORazepam (ATIVAN) 1 MG tablet Take 0.5 tablets (0.5 mg total) by mouth every 8 (eight) hours as needed for anxiety. 45 tablet 0  . losartan (COZAAR) 50 MG tablet TAKE 1 TABLET BY MOUTH EVERY DAY 30 tablet 0  . meclizine  (ANTIVERT) 32 MG tablet Take 1 tablet (32 mg total) by mouth 3 (three) times daily as needed. (Patient taking differently: Take 25 mg by mouth 3 (three) times daily as needed. ) 30 tablet 0  . metoprolol succinate (TOPROL-XL) 25 MG 24 hr tablet TAKE 1 TABLET BY MOUTH EVERY DAY 30 tablet 0  . Multiple Vitamins-Minerals (CENTRUM SILVER PO) Take 1 tablet by mouth every morning.     Marland Kitchen omeprazole (PRILOSEC) 20 MG capsule Take 2 capsules (40 mg total) by mouth 2 (two) times daily before a meal. 60 capsule 6  . ondansetron (ZOFRAN) 4 MG tablet Take 1 tablet (4 mg total) by mouth every 6 (six) hours as needed for nausea or vomiting. 30 tablet 3  . promethazine (PHENERGAN) 12.5 MG tablet Take 12.5 mg by mouth every 6 (six) hours as needed for nausea.     . vitamin C (ASCORBIC ACID) 500 MG tablet Take 500 mg by mouth every morning.     [name] 20 MG TABS tablet TAKE 1 TABLET BY MOUTH EVERY DAY 30 tablet 5  . oxyCODONE-acetaminophen (PERCOCET/ROXICET) 5-325 MG per tablet Take 1-2 tablets by mouth every 4 (four) hours as needed for moderate pain. (Patient not taking: Reported on 02/15/2015) 30 tablet 0   No current facility-administered medications  for this encounter.   Facility-Administered Medications Ordered in Other Encounters  Medication Dose Route Frequency Provider Last Rate Last Dose  . sodium chloride 0.9 % bolus 1,000 mL  1,000 mL Intravenous Once Coralie Keens, MD        Physical Findings: The patient is in no acute distress. Patient is alert and oriented.  height is 5\' 2"  (1.575 m) and weight is 154 lb 4.8 oz (69.99 kg). Her oral temperature is 97.7 F (36.5 C). Her blood pressure is 105/60 and her pulse is 51. Her respiration is 16 and oxygen saturation is 100%. . No palpable supraclavicular or axillary adenopathy. The lungs are clear to auscultation. The heart has regular rhythm and rate. The abdomen is soft and nontender with normal bowel sounds. Pelvic exam and Pap smear not performed in  light of recent completion of treatment.   Lab Findings: Lab Results  Component Value Date   WBC 4.1 02/15/2015   HGB 10.0* 02/15/2015   HCT 29.4* 02/15/2015   MCV 93.6 02/15/2015   PLT 179 02/15/2015    Radiographic Findings: No results found.  Impression:  The patient is recovering from the effects of radiation.  No reports of vaginal bleeding since completion of her radiation therapy  Plan:   I have recommended the patient follow-up with Dr. Alycia Rossetti in 2 months for pelvic exam and Pap smear. The patient will return to radiation oncology in 5 months. Patient will continue on gemcitabine for management of her pancreatic cancer. Today the patient was given a vaginal dilator and instructions on its use in light of her vaginal vault radiation therapy.  ____________________________________ Gery Pray, MD

## 2015-03-01 ENCOUNTER — Ambulatory Visit: Payer: Medicare Other

## 2015-03-01 ENCOUNTER — Ambulatory Visit (HOSPITAL_BASED_OUTPATIENT_CLINIC_OR_DEPARTMENT_OTHER): Payer: Medicare Other

## 2015-03-01 ENCOUNTER — Other Ambulatory Visit (HOSPITAL_BASED_OUTPATIENT_CLINIC_OR_DEPARTMENT_OTHER): Payer: Medicare Other

## 2015-03-01 ENCOUNTER — Ambulatory Visit (HOSPITAL_BASED_OUTPATIENT_CLINIC_OR_DEPARTMENT_OTHER): Payer: Medicare Other | Admitting: Nurse Practitioner

## 2015-03-01 VITALS — BP 103/45 | HR 52 | Temp 97.8°F | Resp 18 | Ht 62.0 in | Wt 153.6 lb

## 2015-03-01 DIAGNOSIS — C250 Malignant neoplasm of head of pancreas: Secondary | ICD-10-CM

## 2015-03-01 DIAGNOSIS — Z7901 Long term (current) use of anticoagulants: Secondary | ICD-10-CM

## 2015-03-01 DIAGNOSIS — C7989 Secondary malignant neoplasm of other specified sites: Secondary | ICD-10-CM | POA: Diagnosis not present

## 2015-03-01 DIAGNOSIS — I4891 Unspecified atrial fibrillation: Secondary | ICD-10-CM | POA: Diagnosis not present

## 2015-03-01 DIAGNOSIS — C541 Malignant neoplasm of endometrium: Secondary | ICD-10-CM

## 2015-03-01 DIAGNOSIS — D701 Agranulocytosis secondary to cancer chemotherapy: Secondary | ICD-10-CM | POA: Diagnosis not present

## 2015-03-01 DIAGNOSIS — Z95828 Presence of other vascular implants and grafts: Secondary | ICD-10-CM

## 2015-03-01 DIAGNOSIS — Z5111 Encounter for antineoplastic chemotherapy: Secondary | ICD-10-CM | POA: Diagnosis not present

## 2015-03-01 LAB — CBC WITH DIFFERENTIAL/PLATELET
BASO%: 0.5 % (ref 0.0–2.0)
Basophils Absolute: 0 10*3/uL (ref 0.0–0.1)
EOS ABS: 0.2 10*3/uL (ref 0.0–0.5)
EOS%: 5.4 % (ref 0.0–7.0)
HCT: 29.3 % — ABNORMAL LOW (ref 34.8–46.6)
HGB: 9.9 g/dL — ABNORMAL LOW (ref 11.6–15.9)
LYMPH%: 29.4 % (ref 14.0–49.7)
MCH: 31.7 pg (ref 25.1–34.0)
MCHC: 33.8 g/dL (ref 31.5–36.0)
MCV: 93.9 fL (ref 79.5–101.0)
MONO#: 0.4 10*3/uL (ref 0.1–0.9)
MONO%: 10.8 % (ref 0.0–14.0)
NEUT#: 2.2 10*3/uL (ref 1.5–6.5)
NEUT%: 53.9 % (ref 38.4–76.8)
Platelets: 173 10*3/uL (ref 145–400)
RBC: 3.12 10*6/uL — AB (ref 3.70–5.45)
RDW: 14.2 % (ref 11.2–14.5)
WBC: 4.1 10*3/uL (ref 3.9–10.3)
lymph#: 1.2 10*3/uL (ref 0.9–3.3)

## 2015-03-01 LAB — COMPREHENSIVE METABOLIC PANEL (CC13)
ALT: 35 U/L (ref 0–55)
ANION GAP: 8 meq/L (ref 3–11)
AST: 40 U/L — ABNORMAL HIGH (ref 5–34)
Albumin: 3.4 g/dL — ABNORMAL LOW (ref 3.5–5.0)
Alkaline Phosphatase: 96 U/L (ref 40–150)
BUN: 13.1 mg/dL (ref 7.0–26.0)
CO2: 24 mEq/L (ref 22–29)
Calcium: 8.6 mg/dL (ref 8.4–10.4)
Chloride: 105 mEq/L (ref 98–109)
Creatinine: 0.7 mg/dL (ref 0.6–1.1)
EGFR: 90 mL/min/{1.73_m2} — ABNORMAL LOW (ref >90)
Glucose: 130 mg/dl (ref 70–140)
Potassium: 4 mEq/L (ref 3.5–5.1)
SODIUM: 137 meq/L (ref 136–145)
TOTAL PROTEIN: 6.3 g/dL — AB (ref 6.4–8.3)
Total Bilirubin: 0.58 mg/dL (ref 0.20–1.20)

## 2015-03-01 MED ORDER — PROCHLORPERAZINE MALEATE 10 MG PO TABS
10.0000 mg | ORAL_TABLET | Freq: Once | ORAL | Status: AC
  Administered 2015-03-01: 10 mg via ORAL

## 2015-03-01 MED ORDER — SODIUM CHLORIDE 0.9 % IV SOLN
800.0000 mg/m2 | Freq: Once | INTRAVENOUS | Status: AC
  Administered 2015-03-01: 1482 mg via INTRAVENOUS
  Filled 2015-03-01: qty 38.98

## 2015-03-01 MED ORDER — HEPARIN SOD (PORK) LOCK FLUSH 100 UNIT/ML IV SOLN
500.0000 [IU] | Freq: Once | INTRAVENOUS | Status: AC | PRN
  Administered 2015-03-01: 500 [IU]
  Filled 2015-03-01: qty 5

## 2015-03-01 MED ORDER — SODIUM CHLORIDE 0.9 % IV SOLN
Freq: Once | INTRAVENOUS | Status: AC
  Administered 2015-03-01: 10:00:00 via INTRAVENOUS

## 2015-03-01 MED ORDER — HEPARIN SOD (PORK) LOCK FLUSH 100 UNIT/ML IV SOLN
500.0000 [IU] | Freq: Once | INTRAVENOUS | Status: DC
  Filled 2015-03-01: qty 5

## 2015-03-01 MED ORDER — SODIUM CHLORIDE 0.9 % IJ SOLN
10.0000 mL | INTRAMUSCULAR | Status: DC | PRN
  Administered 2015-03-01: 10 mL via INTRAVENOUS
  Filled 2015-03-01: qty 10

## 2015-03-01 MED ORDER — SODIUM CHLORIDE 0.9 % IJ SOLN
10.0000 mL | INTRAMUSCULAR | Status: DC | PRN
  Administered 2015-03-01: 10 mL
  Filled 2015-03-01: qty 10

## 2015-03-01 MED ORDER — PROCHLORPERAZINE MALEATE 10 MG PO TABS
ORAL_TABLET | ORAL | Status: AC
  Filled 2015-03-01: qty 1

## 2015-03-01 NOTE — [facility] Oncology Navigator Note (Signed)
Oncology Nurse Navigator Documentation  Oncology Nurse Navigator Flowsheets 03/01/2015  Navigator Encounter Type Treatment--1 month F/U  Patient Visit Type Medonc  Treatment Phase Treatment  Barriers/Navigation Needs No barriers at this time  Time Spent with Patient 5

## 2015-03-01 NOTE — Patient Instructions (Signed)
[facility] Discharge Instructions for Patients Receiving Chemotherapy  Today you received the following chemotherapy agents Gemzar  To help prevent nausea and vomiting after your treatment, we encourage you to take your nausea medication as prescribed   If you develop nausea and vomiting that is not controlled by your nausea medication, call the clinic.   BELOW ARE SYMPTOMS THAT SHOULD BE REPORTED IMMEDIATELY:  *FEVER GREATER THAN 100.5 F  *CHILLS WITH OR WITHOUT FEVER  NAUSEA AND VOMITING THAT IS NOT CONTROLLED WITH YOUR NAUSEA MEDICATION  *UNUSUAL SHORTNESS OF BREATH  *UNUSUAL BRUISING OR BLEEDING  TENDERNESS IN MOUTH AND THROAT WITH OR WITHOUT PRESENCE OF ULCERS  *URINARY PROBLEMS  *BOWEL PROBLEMS  UNUSUAL RASH Items with * indicate a potential emergency and should be followed up as soon as possible.  Feel free to call the clinic you have any questions or concerns. The clinic phone number is [phone].  Please show the CHEMO ALERT CARD at check-in to the Emergency Department and triage nurse.   

## 2015-03-01 NOTE — Progress Notes (Signed)
[facility] OFFICE PROGRESS NOTE   Diagnosis:  Pancreas cancer  INTERVAL HISTORY:   [name] returns as scheduled. She completed cycle 9 gemcitabine 02/15/2015. She denies nausea/vomiting. No mouth sores. No diarrhea. No rash. She denies shortness of breath. She had a cough last week. The cough is better this week. She denies fever. Creon dose was increased recently. She notes improvement in abdominal discomfort after eating. No vaginal bleeding.  Objective:  Vital signs in last 24 hours:  Blood pressure 103/45, pulse 52, temperature 97.8 F (36.6 C), temperature source Oral, resp. rate 18, height 5\' 2"  (1.575 m), weight 153 lb 9.6 oz (69.673 kg), SpO2 100 %.    HEENT: No thrush or ulcers. Resp: Lungs clear bilaterally. No wheezes or rales. Cardio: Regular rate and rhythm. GI: Abdomen soft and nontender. No hepatomegaly. No mass. Vascular: No leg edema. Calves soft and nontender.  Skin: No rash. Port-A-Cath without erythema.    Lab Results:  Lab Results  Component Value Date   WBC 4.1 03/01/2015   HGB 9.9* 03/01/2015   HCT 29.3* 03/01/2015   MCV 93.9 03/01/2015   PLT 173 03/01/2015   NEUTROABS 2.2 03/01/2015    Imaging:  No results found.  Medications: I have reviewed the patient's current medications.  Assessment/Plan: 1. 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  Gemcitabine held 11/02/2014 due to neutropenia.  Gemcitabine 11/09/2014 dose reduced 800 mg/m.  Gemcitabine held 11/16/2014 due to neutropenia.  Gemcitabine resumed 11/23/2014 every 2 week schedule.  Cycle 6 gemcitabine 01/05/2015  Cycle 7  gemcitabine 01/18/2015  Cycle 8 gemcitabine 02/01/2015  Cycle 9 gemcitabine 02/15/2015  Cycle 10 gemcitabine 03/01/2015  2. 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 Admission with post ERCP pancreatitis 06/16/2014  4. History of abdominal pain secondary to #1  5. Pulmonary embolism diagnosed on a CT of the abdomen 09/16/2014  Negative lower extremity Dopplers 09/17/2014  6. Multiple orthopedic surgical procedures  7. Endometrial cancer,stage IA, grade 1 endometrioid adenocarcinoma, 18% myometrial invasion, no lymphovascular space involvement, negative washings  Status post robotic total hysterectomy and bilateral salpingo-oophorectomy 11/30/2010  Recurrent tumor left lateral vagina status post biopsy 11/24/2014 with pathology confirming adenocarcinoma with focal squamous differentiation consistent with endometrial adenocarcinoma  Staging CT scans 12/06/2014 with no evidence of local pancreatic cancer recurrence. Small fluid collection adjacent to the left adrenal gland. Severe hepatic steatosis. No evidence of local extension of endometrial carcinoma. Carcinoma not well-defined at the vaginal cuff. 5 mm right external iliac lymph node. 3.6 mm left external iliac lymph node  Brachytherapy initiated 12/22/2014, completed 01/19/2015  8. History of atrial fibrillation-maintained on xarelto  9. Family history of multiple cancers-negative CancerNext gene panel  10. Prolonged nausea following the pancreaticoduodenectomy. Improved 10/26/2014.  11. Port-A-Cath placement 10/21/2014.  12. Neutropenia secondary to chemotherapy 11/02/2014, 11/16/2014.  13. Diarrhea. Question pancreatic insufficiency. Pancreatic enzyme replacement initiated 01/05/2015.  14. 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 We will make a neurology referral if this  persists.   Disposition: [name] appears stable. She has completed 9 cycles of adjuvant gemcitabine. Plan to proceed with cycle 10 today as scheduled.  She will return for a follow-up visit and cycle 11 in 2 weeks. The plan is to complete 12 cycles with gemcitabine. She will contact the office prior to her next visit with any problems. We specifically discussed worsening of the cough, onset of shortness of breath, fever.    Ned Card ANP/GNP-BC   03/01/2015  9:44 AM

## 2015-03-01 NOTE — Patient Instructions (Signed)

## 2015-03-06 ENCOUNTER — Telehealth: Payer: Self-pay | Admitting: Oncology

## 2015-03-06 NOTE — Telephone Encounter (Addendum)
[name] called and said she used her vaginal dilator for the first time this morning and has noticed a small amount of vaginal bleeding.  Advised her this was normal due to the tissues being irritated.  Also, that we can give her a smaller size dilator to try and to work up to the size M.  Let her know that she can stop by nursing anytime to pick one up.  Also advised her that Dr. Sondra Come will be notified and to let us know if the bleeding worsens.

## 2015-03-07 ENCOUNTER — Telehealth: Payer: Self-pay | Admitting: Oncology

## 2015-03-07 NOTE — Telephone Encounter (Signed)
Called [name] and let her know that Dr. Sondra Come agrees with giving her a smaller size vaginal dilator.  Asked if she would be able to stop by the nursing area.  [name] said she would pick up the new dilator next week when she has a Med Onc appointment.  She said she has not had anymore vaginal bleeding.

## 2015-03-12 ENCOUNTER — Other Ambulatory Visit: Payer: Self-pay | Admitting: Oncology

## 2015-03-15 ENCOUNTER — Ambulatory Visit (HOSPITAL_BASED_OUTPATIENT_CLINIC_OR_DEPARTMENT_OTHER): Payer: Medicare Other

## 2015-03-15 ENCOUNTER — Other Ambulatory Visit (HOSPITAL_BASED_OUTPATIENT_CLINIC_OR_DEPARTMENT_OTHER): Payer: Medicare Other

## 2015-03-15 ENCOUNTER — Ambulatory Visit: Payer: Medicare Other

## 2015-03-15 ENCOUNTER — Other Ambulatory Visit: Payer: Self-pay | Admitting: *Deleted

## 2015-03-15 ENCOUNTER — Ambulatory Visit: Payer: Medicare Other | Admitting: Nutrition

## 2015-03-15 ENCOUNTER — Ambulatory Visit (HOSPITAL_BASED_OUTPATIENT_CLINIC_OR_DEPARTMENT_OTHER): Payer: Medicare Other | Admitting: Oncology

## 2015-03-15 VITALS — BP 106/63 | HR 48 | Temp 97.4°F | Resp 18 | Ht 62.0 in | Wt 155.3 lb

## 2015-03-15 DIAGNOSIS — C250 Malignant neoplasm of head of pancreas: Secondary | ICD-10-CM

## 2015-03-15 DIAGNOSIS — Z5111 Encounter for antineoplastic chemotherapy: Secondary | ICD-10-CM

## 2015-03-15 DIAGNOSIS — I4891 Unspecified atrial fibrillation: Secondary | ICD-10-CM

## 2015-03-15 DIAGNOSIS — H81399 Other peripheral vertigo, unspecified ear: Secondary | ICD-10-CM

## 2015-03-15 DIAGNOSIS — R197 Diarrhea, unspecified: Secondary | ICD-10-CM

## 2015-03-15 DIAGNOSIS — C541 Malignant neoplasm of endometrium: Secondary | ICD-10-CM

## 2015-03-15 DIAGNOSIS — D701 Agranulocytosis secondary to cancer chemotherapy: Secondary | ICD-10-CM

## 2015-03-15 DIAGNOSIS — Z95828 Presence of other vascular implants and grafts: Secondary | ICD-10-CM

## 2015-03-15 LAB — CBC WITH DIFFERENTIAL/PLATELET
BASO%: 0.4 % (ref 0.0–2.0)
BASOS ABS: 0 10*3/uL (ref 0.0–0.1)
EOS ABS: 0.3 10*3/uL (ref 0.0–0.5)
EOS%: 6.9 % (ref 0.0–7.0)
HCT: 29.3 % — ABNORMAL LOW (ref 34.8–46.6)
HGB: 9.8 g/dL — ABNORMAL LOW (ref 11.6–15.9)
LYMPH#: 1.1 10*3/uL (ref 0.9–3.3)
LYMPH%: 25.2 % (ref 14.0–49.7)
MCH: 31.4 pg (ref 25.1–34.0)
MCHC: 33.4 g/dL (ref 31.5–36.0)
MCV: 93.9 fL (ref 79.5–101.0)
MONO#: 0.5 10*3/uL (ref 0.1–0.9)
MONO%: 11.7 % (ref 0.0–14.0)
NEUT%: 55.8 % (ref 38.4–76.8)
NEUTROS ABS: 2.5 10*3/uL (ref 1.5–6.5)
Platelets: 174 10*3/uL (ref 145–400)
RBC: 3.12 10*6/uL — ABNORMAL LOW (ref 3.70–5.45)
RDW: 14.1 % (ref 11.2–14.5)
WBC: 4.5 10*3/uL (ref 3.9–10.3)

## 2015-03-15 LAB — COMPREHENSIVE METABOLIC PANEL (CC13)
ALK PHOS: 93 U/L (ref 40–150)
ALT: 32 U/L (ref 0–55)
AST: 30 U/L (ref 5–34)
Albumin: 3.5 g/dL (ref 3.5–5.0)
Anion Gap: 5 mEq/L (ref 3–11)
BUN: 14.2 mg/dL (ref 7.0–26.0)
CALCIUM: 8.7 mg/dL (ref 8.4–10.4)
CHLORIDE: 106 meq/L (ref 98–109)
CO2: 26 mEq/L (ref 22–29)
CREATININE: 0.7 mg/dL (ref 0.6–1.1)
EGFR: 89 mL/min/{1.73_m2} — AB (ref >90)
Glucose: 130 mg/dl (ref 70–140)
Potassium: 4 mEq/L (ref 3.5–5.1)
Sodium: 137 mEq/L (ref 136–145)
Total Bilirubin: 0.52 mg/dL (ref 0.20–1.20)
Total Protein: 6.2 g/dL — ABNORMAL LOW (ref 6.4–8.3)

## 2015-03-15 MED ORDER — OXYCODONE-ACETAMINOPHEN 5-325 MG PO TABS: 1.0000 | ORAL_TABLET | ORAL | Status: DC | PRN

## 2015-03-15 MED ORDER — SODIUM CHLORIDE 0.9 % IV SOLN
800.0000 mg/m2 | Freq: Once | INTRAVENOUS | Status: AC
  Administered 2015-03-15: 1482 mg via INTRAVENOUS
  Filled 2015-03-15: qty 38.98

## 2015-03-15 MED ORDER — PROCHLORPERAZINE MALEATE 10 MG PO TABS
10.0000 mg | ORAL_TABLET | Freq: Once | ORAL | Status: AC
  Administered 2015-03-15: 10 mg via ORAL

## 2015-03-15 MED ORDER — SODIUM CHLORIDE 0.9 % IJ SOLN
10.0000 mL | INTRAMUSCULAR | Status: DC | PRN
  Administered 2015-03-15: 10 mL via INTRAVENOUS
  Filled 2015-03-15: qty 10

## 2015-03-15 MED ORDER — SODIUM CHLORIDE 0.9 % IJ SOLN
10.0000 mL | INTRAMUSCULAR | Status: DC | PRN
  Administered 2015-03-15: 10 mL
  Filled 2015-03-15: qty 10

## 2015-03-15 MED ORDER — PROCHLORPERAZINE MALEATE 10 MG PO TABS
ORAL_TABLET | ORAL | Status: AC
  Filled 2015-03-15: qty 1

## 2015-03-15 MED ORDER — HEPARIN SOD (PORK) LOCK FLUSH 100 UNIT/ML IV SOLN
500.0000 [IU] | Freq: Once | INTRAVENOUS | Status: DC
  Filled 2015-03-15: qty 5

## 2015-03-15 MED ORDER — HEPARIN SOD (PORK) LOCK FLUSH 100 UNIT/ML IV SOLN
500.0000 [IU] | Freq: Once | INTRAVENOUS | Status: AC | PRN
  Administered 2015-03-15: 500 [IU]
  Filled 2015-03-15: qty 5

## 2015-03-15 MED ORDER — LORAZEPAM 1 MG PO TABS: 0.5000 mg | ORAL_TABLET | Freq: Three times a day (TID) | ORAL | Status: DC | PRN

## 2015-03-15 MED ORDER — SODIUM CHLORIDE 0.9 % IV SOLN
Freq: Once | INTRAVENOUS | Status: AC
  Administered 2015-03-15: 10:00:00 via INTRAVENOUS

## 2015-03-15 NOTE — Progress Notes (Signed)
Nutrition follow-up completed with patient during chemotherapy for pancreas cancer. Patient's weight has improved and was documented as 155.3 pounds June 22 increased from 150.7 pounds May 11. Patient reports she is compliant with taking her enzymes of 36,000 units for meals and 12,000 units per snacks. Patient reports stools are currently very soft and have increased to 4 times a day. Patient states she has not been eating enough protein.  Nutrition diagnosis: Food and nutrition related knowledge deficit improved.  Intervention: Patient was educated on strategies for increasing calories and protein. Recommended patient resume boost high protein or boost plus once a day. Encouraged compliance with enzymes as prescribed. Encouraged patient to continue monitoring stools and communicate with M.D. as needed. Questions were answered.  Teach back method used.  Monitoring, evaluation, goals: Patient will tolerate adequate calories and protein to minimize weight loss.  Next visit: To be scheduled as needed.  **Disclaimer: This note was dictated with voice recognition software. Similar sounding words can inadvertently be transcribed and this note may contain transcription errors which may not have been corrected upon publication of note.**

## 2015-03-15 NOTE — Patient Instructions (Signed)

## 2015-03-15 NOTE — Progress Notes (Signed)
[facility] OFFICE PROGRESS NOTE   Diagnosis: Pancreas cancer  INTERVAL HISTORY:   [name] returns as scheduled. She completed another treatment with gemcitabine on 03/01/2015. She tolerated chemotherapy well. No fever or rash. She reports increased mid upper abdominal pain for the past few weeks. The pain has been present intermittently for months. She takes oxycodone occasionally. She has burning discomfort in the subxiphoid region. She reports frequent bowel movements, but no diarrhea. The vertigo has improved.  Objective:  Vital signs in last 24 hours:  Blood pressure 106/63, pulse 48, temperature 97.4 F (36.3 C), temperature source Oral, resp. rate 18, height 5\' 2"  (1.575 m), weight 155 lb 4.8 oz (70.444 kg), SpO2 99 %.    HEENT: No thrush or ulcers Resp: Lungs clear bilaterally Cardio: Rate and rhythm GI: No hepatomegaly, mild tenderness in the abdomen diffusely-especially in the mid upper abdomen. No mass. Vascular: The leg edema.   Portacath/PICC-without erythema  Lab Results:  Lab Results  Component Value Date   WBC 4.5 03/15/2015   HGB 9.8* 03/15/2015   HCT 29.3* 03/15/2015   MCV 93.9 03/15/2015   PLT 174 03/15/2015   NEUTROABS 2.5 03/15/2015    Medications: I have reviewed the patient's current medications.  Assessment/Plan: 1. 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  Gemcitabine held 11/02/2014 due to neutropenia.  Gemcitabine 11/09/2014 dose reduced 800 mg/m.  Gemcitabine held 11/16/2014 due to neutropenia.  Gemcitabine resumed 11/23/2014 every 2 week schedule.  Cycle 6 gemcitabine  01/05/2015  Cycle 7 gemcitabine 01/18/2015  Cycle 8 gemcitabine 02/01/2015  Cycle 9 gemcitabine 02/15/2015  Cycle 10 gemcitabine 03/01/2015  Cycle 11 gemcitabine 03/15/2015  2. 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 Admission with post ERCP pancreatitis 06/16/2014  4. History of abdominal pain secondary to #1  5. Pulmonary embolism diagnosed on a CT of the abdomen 09/16/2014  Negative lower extremity Dopplers 09/17/2014  6. Multiple orthopedic surgical procedures  7. Endometrial cancer,stage IA, grade 1 endometrioid adenocarcinoma, 18% myometrial invasion, no lymphovascular space involvement, negative washings  Status post robotic total hysterectomy and bilateral salpingo-oophorectomy 11/30/2010  Recurrent tumor left lateral vagina status post biopsy 11/24/2014 with pathology confirming adenocarcinoma with focal squamous differentiation consistent with endometrial adenocarcinoma  Staging CT scans 12/06/2014 with no evidence of local pancreatic cancer recurrence. Small fluid collection adjacent to the left adrenal gland. Severe hepatic steatosis. No evidence of local extension of endometrial carcinoma. Carcinoma not well-defined at the vaginal cuff. 5 mm right external iliac lymph node. 3.6 mm left external iliac lymph node  Brachytherapy initiated 12/22/2014, completed 01/19/2015  8. History of atrial fibrillation-maintained on xarelto  9. Family history of multiple cancers-negative CancerNext gene panel  10. Prolonged nausea following the pancreaticoduodenectomy. Improved 10/26/2014.  11. Port-A-Cath placement 10/21/2014.  12. Neutropenia secondary to chemotherapy 11/02/2014, 11/16/2014.  13. Diarrhea. Question pancreatic insufficiency. Pancreatic enzyme replacement initiated 01/05/2015.  14. Positional vertigo--most likely unrelated to pancreas cancer or chemotherapy, she appears to have benign  positional vertigo. We will make a neurology referral if this persists.  Disposition:  She appears to be tolerating the chemotherapy well. She has developed increased abdominal pain. She will try Prilosec. If this does not  help or if the pain worsens she will let us know.Ms Wickware will return for an office visit and chemotherapy in 2 weeks.  Betsy Coder, MD  03/15/2015  9:47 AM

## 2015-03-15 NOTE — Patient Instructions (Signed)
[facility] Discharge Instructions for Patients Receiving Chemotherapy  Today you received the following chemotherapy agents Gemzar  To help prevent nausea and vomiting after your treatment, we encourage you to take your nausea medication as directed.    If you develop nausea and vomiting that is not controlled by your nausea medication, call the clinic.   BELOW ARE SYMPTOMS THAT SHOULD BE REPORTED IMMEDIATELY:  *FEVER GREATER THAN 100.5 F  *CHILLS WITH OR WITHOUT FEVER  NAUSEA AND VOMITING THAT IS NOT CONTROLLED WITH YOUR NAUSEA MEDICATION  *UNUSUAL SHORTNESS OF BREATH  *UNUSUAL BRUISING OR BLEEDING  TENDERNESS IN MOUTH AND THROAT WITH OR WITHOUT PRESENCE OF ULCERS  *URINARY PROBLEMS  *BOWEL PROBLEMS  UNUSUAL RASH Items with * indicate a potential emergency and should be followed up as soon as possible.  Feel free to call the clinic you have any questions or concerns. The clinic phone number is [phone].  Please show the CHEMO ALERT CARD at check-in to the Emergency Department and triage nurse.   

## 2015-03-27 ENCOUNTER — Other Ambulatory Visit: Payer: Self-pay | Admitting: Oncology

## 2015-03-28 ENCOUNTER — Other Ambulatory Visit: Payer: Self-pay | Admitting: Oncology

## 2015-03-28 ENCOUNTER — Encounter: Payer: Self-pay | Admitting: Oncology

## 2015-03-29 ENCOUNTER — Other Ambulatory Visit (HOSPITAL_BASED_OUTPATIENT_CLINIC_OR_DEPARTMENT_OTHER): Payer: Medicare Other

## 2015-03-29 ENCOUNTER — Ambulatory Visit: Payer: Medicare Other

## 2015-03-29 ENCOUNTER — Telehealth: Payer: Self-pay | Admitting: Oncology

## 2015-03-29 ENCOUNTER — Ambulatory Visit (HOSPITAL_BASED_OUTPATIENT_CLINIC_OR_DEPARTMENT_OTHER): Payer: Medicare Other | Admitting: Nutrition

## 2015-03-29 ENCOUNTER — Ambulatory Visit (HOSPITAL_BASED_OUTPATIENT_CLINIC_OR_DEPARTMENT_OTHER): Payer: Medicare Other | Admitting: Oncology

## 2015-03-29 ENCOUNTER — Ambulatory Visit (HOSPITAL_BASED_OUTPATIENT_CLINIC_OR_DEPARTMENT_OTHER): Payer: Medicare Other

## 2015-03-29 VITALS — BP 109/50 | HR 52 | Temp 98.0°F | Resp 18 | Ht 62.0 in | Wt 152.6 lb

## 2015-03-29 DIAGNOSIS — C250 Malignant neoplasm of head of pancreas: Secondary | ICD-10-CM

## 2015-03-29 DIAGNOSIS — D701 Agranulocytosis secondary to cancer chemotherapy: Secondary | ICD-10-CM

## 2015-03-29 DIAGNOSIS — Z7901 Long term (current) use of anticoagulants: Secondary | ICD-10-CM

## 2015-03-29 DIAGNOSIS — Z5111 Encounter for antineoplastic chemotherapy: Secondary | ICD-10-CM | POA: Diagnosis not present

## 2015-03-29 DIAGNOSIS — C541 Malignant neoplasm of endometrium: Secondary | ICD-10-CM

## 2015-03-29 DIAGNOSIS — Z95828 Presence of other vascular implants and grafts: Secondary | ICD-10-CM

## 2015-03-29 DIAGNOSIS — I4891 Unspecified atrial fibrillation: Secondary | ICD-10-CM | POA: Diagnosis not present

## 2015-03-29 DIAGNOSIS — C701 Malignant neoplasm of spinal meninges: Secondary | ICD-10-CM

## 2015-03-29 LAB — CBC WITH DIFFERENTIAL/PLATELET
BASO%: 0.9 % (ref 0.0–2.0)
Basophils Absolute: 0 10*3/uL (ref 0.0–0.1)
EOS%: 7 % (ref 0.0–7.0)
Eosinophils Absolute: 0.3 10*3/uL (ref 0.0–0.5)
HCT: 32 % — ABNORMAL LOW (ref 34.8–46.6)
HGB: 10.7 g/dL — ABNORMAL LOW (ref 11.6–15.9)
LYMPH%: 28.5 % (ref 14.0–49.7)
MCH: 31.2 pg (ref 25.1–34.0)
MCHC: 33.5 g/dL (ref 31.5–36.0)
MCV: 93.1 fL (ref 79.5–101.0)
MONO#: 0.5 10*3/uL (ref 0.1–0.9)
MONO%: 11.4 % (ref 0.0–14.0)
NEUT#: 2.1 10*3/uL (ref 1.5–6.5)
NEUT%: 52.2 % (ref 38.4–76.8)
Platelets: 182 10*3/uL (ref 145–400)
RBC: 3.44 10*6/uL — ABNORMAL LOW (ref 3.70–5.45)
RDW: 14.5 % (ref 11.2–14.5)
WBC: 4.1 10*3/uL (ref 3.9–10.3)
lymph#: 1.2 10*3/uL (ref 0.9–3.3)

## 2015-03-29 LAB — CANCER ANTIGEN 19-9: CA 19 9: 20.1 U/mL (ref <35.0)

## 2015-03-29 MED ORDER — SODIUM CHLORIDE 0.9 % IV SOLN
800.0000 mg/m2 | Freq: Once | INTRAVENOUS | Status: AC
  Administered 2015-03-29: 1482 mg via INTRAVENOUS
  Filled 2015-03-29: qty 38.98

## 2015-03-29 MED ORDER — HEPARIN SOD (PORK) LOCK FLUSH 100 UNIT/ML IV SOLN
500.0000 [IU] | Freq: Once | INTRAVENOUS | Status: AC | PRN
  Administered 2015-03-29: 500 [IU]
  Filled 2015-03-29: qty 5

## 2015-03-29 MED ORDER — PROCHLORPERAZINE MALEATE 10 MG PO TABS
ORAL_TABLET | ORAL | Status: AC
  Filled 2015-03-29: qty 1

## 2015-03-29 MED ORDER — PROCHLORPERAZINE MALEATE 10 MG PO TABS
10.0000 mg | ORAL_TABLET | Freq: Once | ORAL | Status: AC
  Administered 2015-03-29: 10 mg via ORAL

## 2015-03-29 MED ORDER — PROCHLORPERAZINE EDISYLATE 5 MG/ML IJ SOLN
INTRAMUSCULAR | Status: AC
  Filled 2015-03-29: qty 2

## 2015-03-29 MED ORDER — SODIUM CHLORIDE 0.9 % IJ SOLN
10.0000 mL | INTRAMUSCULAR | Status: DC | PRN
  Administered 2015-03-29: 10 mL
  Filled 2015-03-29: qty 10

## 2015-03-29 MED ORDER — SODIUM CHLORIDE 0.9 % IJ SOLN
10.0000 mL | INTRAMUSCULAR | Status: DC | PRN
  Administered 2015-03-29: 10 mL via INTRAVENOUS
  Filled 2015-03-29: qty 10

## 2015-03-29 MED ORDER — SODIUM CHLORIDE 0.9 % IV SOLN
Freq: Once | INTRAVENOUS | Status: AC
  Administered 2015-03-29: 11:00:00 via INTRAVENOUS

## 2015-03-29 NOTE — Progress Notes (Signed)
OK to treat without CMet per Dr. [name].

## 2015-03-29 NOTE — Progress Notes (Signed)
  [place] OFFICE PROGRESS NOTE   Diagnosis: Pancreas cancer  INTERVAL HISTORY:   [name] returns as scheduled. She completed another treatment with gemcitabine on 03/15/2015. No fever. Good appetite and energy level. No vaginal bleeding. No vertigo symptoms.  Objective:  Vital signs in last 24 hours:  Blood pressure 109/50, pulse 52, temperature 98 F (36.7 C), temperature source Oral, resp. rate 18, height 5\' 2"  (1.575 m), weight 152 lb 9.6 oz (69.219 kg), SpO2 100 %.    HEENT: No thrush or ulcers Resp: Lungs clear bilaterally Cardio: Regular rate and rhythm GI: No hepatomegaly, no mass Vascular: No leg edema  Portacath/PICC-without erythema  Lab Results:  Lab Results  Component Value Date   WBC 4.1 03/29/2015   HGB 10.7* 03/29/2015   HCT 32.0* 03/29/2015   MCV 93.1 03/29/2015   PLT 182 03/29/2015   NEUTROABS 2.1 03/29/2015     Medications: I have reviewed the patient's current medications.  Assessment/Plan: 1. 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  Gemcitabine held 11/02/2014 due to neutropenia.  Gemcitabine 11/09/2014 dose reduced 800 mg/m.  Gemcitabine held 11/16/2014 due to neutropenia.  Gemcitabine resumed 11/23/2014 every 2 week schedule.  Cycle 6 gemcitabine 01/05/2015  Cycle 7 gemcitabine 01/18/2015  Cycle 8 gemcitabine 02/01/2015  Cycle 9 gemcitabine 02/15/2015  Cycle 10 gemcitabine 03/01/2015  Cycle 11 gemcitabine 03/15/2015  Cycle 12 gemcitabine 03/29/2015  2. 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 Admission with post  ERCP pancreatitis 06/16/2014  4. History of abdominal pain secondary to #1  5. Pulmonary embolism diagnosed on a CT of the abdomen 09/16/2014  Negative lower extremity Dopplers 09/17/2014  6. Multiple orthopedic surgical procedures  7. Endometrial cancer,stage IA, grade 1 endometrioid adenocarcinoma, 18% myometrial invasion, no lymphovascular space involvement, negative washings  Status post robotic total hysterectomy and bilateral salpingo-oophorectomy 11/30/2010  Recurrent tumor left lateral vagina status post biopsy 11/24/2014 with pathology confirming adenocarcinoma with focal squamous differentiation consistent with endometrial adenocarcinoma  Staging CT scans 12/06/2014 with no evidence of local pancreatic cancer recurrence. Small fluid collection adjacent to the left adrenal gland. Severe hepatic steatosis. No evidence of local extension of endometrial carcinoma. Carcinoma not well-defined at the vaginal cuff. 5 mm right external iliac lymph node. 3.6 mm left external iliac lymph node  Brachytherapy initiated 12/22/2014, completed 01/19/2015  8. History of atrial fibrillation-maintained on xarelto  9. Family history of multiple cancers-negative CancerNext gene panel  10. Prolonged nausea following the pancreaticoduodenectomy. Improved 10/26/2014.  11. Port-A-Cath placement 10/21/2014.  12. Neutropenia secondary to chemotherapy 11/02/2014, 11/16/2014.  13. Diarrhea. Question pancreatic insufficiency. Pancreatic enzyme replacement initiated 01/05/2015.  14. History of Positional vertigo    Disposition:  [name] appears well. She will complete a final cycle of adjuvant gemcitabine today. She will return for an office visit and Port-A-Cath flush in 6 weeks.  Betsy Coder, MD  03/29/2015  10:34 AM

## 2015-03-29 NOTE — Progress Notes (Signed)
Nutrition follow-up completed with patient during final chemotherapy for pancreas cancer. Weight documented as 152 pounds. Patient reports she continues to take pancreatic enzymes, 36,000 units per meal and 12,000 units per snack. Stool volume has decreased and is between 1 and 3 stools daily. Patient reports she has increased protein containing foods.  Nutrition diagnosis: Food and nutrition related knowledge deficit resolved.  Encouraged patient to continue strategies for healthy diet to maintain her weight. Patient has my contact information and agrees to call me with any concerns or questions.

## 2015-03-29 NOTE — Patient Instructions (Signed)
[facility] Discharge Instructions for Patients Receiving Chemotherapy  Today you received the following chemotherapy agents Gemzar  To help prevent nausea and vomiting after your treatment, we encourage you to take your nausea medication as directed.    If you develop nausea and vomiting that is not controlled by your nausea medication, call the clinic.   BELOW ARE SYMPTOMS THAT SHOULD BE REPORTED IMMEDIATELY:  *FEVER GREATER THAN 100.5 F  *CHILLS WITH OR WITHOUT FEVER  NAUSEA AND VOMITING THAT IS NOT CONTROLLED WITH YOUR NAUSEA MEDICATION  *UNUSUAL SHORTNESS OF BREATH  *UNUSUAL BRUISING OR BLEEDING  TENDERNESS IN MOUTH AND THROAT WITH OR WITHOUT PRESENCE OF ULCERS  *URINARY PROBLEMS  *BOWEL PROBLEMS  UNUSUAL RASH Items with * indicate a potential emergency and should be followed up as soon as possible.  Feel free to call the clinic you have any questions or concerns. The clinic phone number is [phone].  Please show the CHEMO ALERT CARD at check-in to the Emergency Department and triage nurse.   

## 2015-03-29 NOTE — Patient Instructions (Signed)

## 2015-03-29 NOTE — Telephone Encounter (Signed)
per pof to sch pt appt-gave pt copy of avs °

## 2015-04-17 ENCOUNTER — Other Ambulatory Visit: Payer: Self-pay | Admitting: Cardiology

## 2015-04-26 ENCOUNTER — Ambulatory Visit: Payer: Medicare Other | Attending: Gynecologic Oncology | Admitting: Gynecologic Oncology

## 2015-04-26 ENCOUNTER — Other Ambulatory Visit: Payer: Self-pay | Admitting: Cardiology

## 2015-04-26 ENCOUNTER — Encounter: Payer: Self-pay | Admitting: Gynecologic Oncology

## 2015-04-26 VITALS — BP 126/61 | HR 58 | Temp 98.7°F | Resp 18 | Ht 62.0 in | Wt 155.6 lb

## 2015-04-26 DIAGNOSIS — C259 Malignant neoplasm of pancreas, unspecified: Secondary | ICD-10-CM

## 2015-04-26 DIAGNOSIS — C541 Malignant neoplasm of endometrium: Secondary | ICD-10-CM | POA: Insufficient documentation

## 2015-04-26 NOTE — Patient Instructions (Signed)
Please followup with Dr. Alycia Rossetti in February 2017. Please call our office in November or December to schedule this appointment or sooner with any concerns.  Also, let our office know when Dr. [name] plans to do a CT scan so we will know if we need to order one in March 2017.
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
## 2015-05-10 ENCOUNTER — Telehealth: Payer: Self-pay | Admitting: Nurse Practitioner

## 2015-05-10 ENCOUNTER — Other Ambulatory Visit: Payer: Self-pay | Admitting: Oncology

## 2015-05-10 ENCOUNTER — Other Ambulatory Visit: Payer: Medicare Other

## 2015-05-10 ENCOUNTER — Ambulatory Visit (HOSPITAL_BASED_OUTPATIENT_CLINIC_OR_DEPARTMENT_OTHER): Payer: Medicare Other | Admitting: Nurse Practitioner

## 2015-05-10 ENCOUNTER — Ambulatory Visit (HOSPITAL_BASED_OUTPATIENT_CLINIC_OR_DEPARTMENT_OTHER): Payer: Medicare Other

## 2015-05-10 VITALS — BP 108/52 | HR 54 | Temp 97.8°F | Resp 18 | Ht 62.0 in | Wt 156.9 lb

## 2015-05-10 DIAGNOSIS — Z8544 Personal history of malignant neoplasm of other female genital organs: Secondary | ICD-10-CM

## 2015-05-10 DIAGNOSIS — C250 Malignant neoplasm of head of pancreas: Secondary | ICD-10-CM

## 2015-05-10 DIAGNOSIS — I4891 Unspecified atrial fibrillation: Secondary | ICD-10-CM | POA: Diagnosis not present

## 2015-05-10 DIAGNOSIS — Z95828 Presence of other vascular implants and grafts: Secondary | ICD-10-CM

## 2015-05-10 DIAGNOSIS — Z452 Encounter for adjustment and management of vascular access device: Secondary | ICD-10-CM | POA: Diagnosis not present

## 2015-05-10 LAB — COMPREHENSIVE METABOLIC PANEL (CC13)
ALT: 46 U/L (ref 0–55)
AST: 46 U/L — AB (ref 5–34)
Albumin: 3.6 g/dL (ref 3.5–5.0)
Alkaline Phosphatase: 125 U/L (ref 40–150)
Anion Gap: 8 mEq/L (ref 3–11)
BUN: 13.8 mg/dL (ref 7.0–26.0)
CHLORIDE: 106 meq/L (ref 98–109)
CO2: 23 mEq/L (ref 22–29)
Calcium: 8.7 mg/dL (ref 8.4–10.4)
Creatinine: 0.7 mg/dL (ref 0.6–1.1)
EGFR: 86 mL/min/{1.73_m2} — ABNORMAL LOW (ref >90)
GLUCOSE: 143 mg/dL — AB (ref 70–140)
Potassium: 3.9 mEq/L (ref 3.5–5.1)
Sodium: 137 mEq/L (ref 136–145)
Total Bilirubin: 0.5 mg/dL (ref 0.20–1.20)
Total Protein: 6.5 g/dL (ref 6.4–8.3)

## 2015-05-10 LAB — CBC WITH DIFFERENTIAL/PLATELET
BASO%: 0.5 % (ref 0.0–2.0)
Basophils Absolute: 0 10*3/uL (ref 0.0–0.1)
EOS%: 5.7 % (ref 0.0–7.0)
Eosinophils Absolute: 0.2 10*3/uL (ref 0.0–0.5)
HCT: 32 % — ABNORMAL LOW (ref 34.8–46.6)
HGB: 10.6 g/dL — ABNORMAL LOW (ref 11.6–15.9)
LYMPH%: 36.6 % (ref 14.0–49.7)
MCH: 29.7 pg (ref 25.1–34.0)
MCHC: 33.1 g/dL (ref 31.5–36.0)
MCV: 89.6 fL (ref 79.5–101.0)
MONO#: 0.3 10*3/uL (ref 0.1–0.9)
MONO%: 8.3 % (ref 0.0–14.0)
NEUT#: 1.9 10*3/uL (ref 1.5–6.5)
NEUT%: 48.9 % (ref 38.4–76.8)
Platelets: 163 10*3/uL (ref 145–400)
RBC: 3.57 10*6/uL — AB (ref 3.70–5.45)
RDW: 13.6 % (ref 11.2–14.5)
WBC: 3.9 10*3/uL (ref 3.9–10.3)
lymph#: 1.4 10*3/uL (ref 0.9–3.3)

## 2015-05-10 LAB — CANCER ANTIGEN 19-9: CA 19-9: 14.2 U/mL (ref <35.0)

## 2015-05-10 MED ORDER — HEPARIN SOD (PORK) LOCK FLUSH 100 UNIT/ML IV SOLN
500.0000 [IU] | Freq: Once | INTRAVENOUS | Status: AC
Start: 2015-05-10 — End: 2015-05-10
  Administered 2015-05-10: 500 [IU] via INTRAVENOUS
  Filled 2015-05-10: qty 5

## 2015-05-10 MED ORDER — SODIUM CHLORIDE 0.9 % IJ SOLN
10.0000 mL | INTRAMUSCULAR | Status: DC | PRN
  Administered 2015-05-10: 10 mL via INTRAVENOUS
  Filled 2015-05-10: qty 10

## 2015-05-10 MED ORDER — LORAZEPAM 1 MG PO TABS: 0.5000 mg | ORAL_TABLET | Freq: Three times a day (TID) | ORAL | Status: DC | PRN

## 2015-05-10 NOTE — Progress Notes (Signed)
[facility] OFFICE PROGRESS NOTE   Diagnosis:  Pancreas cancer  INTERVAL HISTORY:   [name] returns as scheduled. She completed the final treatment with adjuvant gemcitabine on 03/29/2015. She denies nausea/vomiting. No diarrhea. No vaginal bleeding. She has intermittent abdominal "tenderness". This has been ongoing since surgery. She takes Percocet as needed. She has a good appetite and overall is feeling well.  Objective:  Vital signs in last 24 hours:  Blood pressure 108/52, pulse 54, temperature 97.8 F (36.6 C), temperature source Oral, resp. rate 18, height 5\' 2"  (1.575 m), weight 156 lb 14.4 oz (71.169 kg), SpO2 100 %.    HEENT: No thrush or ulcers. Lymphatics: No palpable cervical, supraclavicular, axillary or inguinal lymph nodes. Resp: Lungs clear bilaterally. Cardio: Regular rate and rhythm. GI: Abdomen soft. Mild tenderness surrounding the midline incision. No organomegaly. No mass. Vascular: No leg edema. Port-A-Cath without erythema.    Lab Results:  Lab Results  Component Value Date   WBC 3.9 05/10/2015   HGB 10.6* 05/10/2015   HCT 32.0* 05/10/2015   MCV 89.6 05/10/2015   PLT 163 05/10/2015   NEUTROABS 1.9 05/10/2015    Imaging:  No results found.  Medications: I have reviewed the patient's current medications.  Assessment/Plan: 1. 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  Gemcitabine held 11/02/2014 due to neutropenia.  Gemcitabine 11/09/2014 dose reduced 800 mg/m.  Gemcitabine held 11/16/2014 due to neutropenia.  Gemcitabine resumed 11/23/2014 every 2 week schedule.  Cycle 6 gemcitabine  01/05/2015  Cycle 7 gemcitabine 01/18/2015  Cycle 8 gemcitabine 02/01/2015  Cycle 9 gemcitabine 02/15/2015  Cycle 10 gemcitabine 03/01/2015  Cycle 11 gemcitabine 03/15/2015  Cycle 12 gemcitabine 03/29/2015  2. 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 Admission with post ERCP pancreatitis 06/16/2014  4. History of abdominal pain secondary to #1  5. Pulmonary embolism diagnosed on a CT of the abdomen 09/16/2014  Negative lower extremity Dopplers 09/17/2014  6. Multiple orthopedic surgical procedures  7. Endometrial cancer,stage IA, grade 1 endometrioid adenocarcinoma, 18% myometrial invasion, no lymphovascular space involvement, negative washings  Status post robotic total hysterectomy and bilateral salpingo-oophorectomy 11/30/2010  Recurrent tumor left lateral vagina status post biopsy 11/24/2014 with pathology confirming adenocarcinoma with focal squamous differentiation consistent with endometrial adenocarcinoma  Staging CT scans 12/06/2014 with no evidence of local pancreatic cancer recurrence. Small fluid collection adjacent to the left adrenal gland. Severe hepatic steatosis. No evidence of local extension of endometrial carcinoma. Carcinoma not well-defined at the vaginal cuff. 5 mm right external iliac lymph node. 3.6 mm left external iliac lymph node  Brachytherapy initiated 12/22/2014, completed 01/19/2015  8. History of atrial fibrillation-maintained on xarelto  9. Family history of multiple cancers-negative CancerNext gene panel  10. Prolonged nausea following the pancreaticoduodenectomy. Improved 10/26/2014.  11. Port-A-Cath placement 10/21/2014.  12. Neutropenia secondary to chemotherapy 11/02/2014, 11/16/2014.  13. Diarrhea. Question pancreatic insufficiency. Pancreatic enzyme replacement initiated 01/05/2015.  14. History of positional vertigo    Disposition: [name] appears stable.  She is in clinical remission from pancreas cancer. We will follow-up on the CA-19-9 from today. She will return for a follow-up visit, labs and Port-A-Cath flush in 6 weeks. She will contact  the office in the interim with any problems.  Plan reviewed with [name].    Ned Card ANP/GNP-BC   05/10/2015  9:54 AM

## 2015-05-10 NOTE — Telephone Encounter (Signed)
Gave avs & calendar for September °

## 2015-05-10 NOTE — Patient Instructions (Signed)

## 2015-05-15 ENCOUNTER — Telehealth: Payer: Self-pay | Admitting: *Deleted

## 2015-05-15 NOTE — Telephone Encounter (Signed)
  Oncology Nurse Navigator Documentation    Navigator Encounter Type: Telephone;3 month (05/15/15 1357): Call to f/u on status since chemo completed 03/29/15. Left VM that I was calling to check on her. Please call for any needs or questions.

## 2015-05-17 ENCOUNTER — Other Ambulatory Visit: Payer: Self-pay | Admitting: Cardiology

## 2015-06-21 ENCOUNTER — Other Ambulatory Visit (HOSPITAL_BASED_OUTPATIENT_CLINIC_OR_DEPARTMENT_OTHER): Payer: Medicare Other

## 2015-06-21 ENCOUNTER — Ambulatory Visit (HOSPITAL_BASED_OUTPATIENT_CLINIC_OR_DEPARTMENT_OTHER): Payer: Medicare Other

## 2015-06-21 ENCOUNTER — Telehealth: Payer: Self-pay | Admitting: Oncology

## 2015-06-21 ENCOUNTER — Ambulatory Visit (HOSPITAL_BASED_OUTPATIENT_CLINIC_OR_DEPARTMENT_OTHER): Payer: Medicare Other | Admitting: Oncology

## 2015-06-21 VITALS — BP 116/62 | HR 50 | Temp 97.9°F | Resp 18 | Ht 62.0 in | Wt 157.2 lb

## 2015-06-21 DIAGNOSIS — Z8507 Personal history of malignant neoplasm of pancreas: Secondary | ICD-10-CM

## 2015-06-21 DIAGNOSIS — Z95828 Presence of other vascular implants and grafts: Secondary | ICD-10-CM

## 2015-06-21 DIAGNOSIS — Z452 Encounter for adjustment and management of vascular access device: Secondary | ICD-10-CM

## 2015-06-21 DIAGNOSIS — Z8544 Personal history of malignant neoplasm of other female genital organs: Secondary | ICD-10-CM

## 2015-06-21 DIAGNOSIS — C250 Malignant neoplasm of head of pancreas: Secondary | ICD-10-CM

## 2015-06-21 LAB — CBC WITH DIFFERENTIAL/PLATELET
BASO%: 0.8 % (ref 0.0–2.0)
BASOS ABS: 0 10*3/uL (ref 0.0–0.1)
EOS%: 3.4 % (ref 0.0–7.0)
Eosinophils Absolute: 0.1 10*3/uL (ref 0.0–0.5)
HEMATOCRIT: 34.2 % — AB (ref 34.8–46.6)
HGB: 11.5 g/dL — ABNORMAL LOW (ref 11.6–15.9)
LYMPH#: 1.4 10*3/uL (ref 0.9–3.3)
LYMPH%: 38 % (ref 14.0–49.7)
MCH: 29.4 pg (ref 25.1–34.0)
MCHC: 33.6 g/dL (ref 31.5–36.0)
MCV: 87.7 fL (ref 79.5–101.0)
MONO#: 0.4 10*3/uL (ref 0.1–0.9)
MONO%: 10.8 % (ref 0.0–14.0)
NEUT#: 1.7 10*3/uL (ref 1.5–6.5)
NEUT%: 47 % (ref 38.4–76.8)
PLATELETS: 192 10*3/uL (ref 145–400)
RBC: 3.9 10*6/uL (ref 3.70–5.45)
RDW: 13.7 % (ref 11.2–14.5)
WBC: 3.7 10*3/uL — ABNORMAL LOW (ref 3.9–10.3)

## 2015-06-21 LAB — COMPREHENSIVE METABOLIC PANEL (CC13)
ALT: 44 U/L (ref 0–55)
AST: 57 U/L — AB (ref 5–34)
Albumin: 3.7 g/dL (ref 3.5–5.0)
Alkaline Phosphatase: 144 U/L (ref 40–150)
Anion Gap: 6 mEq/L (ref 3–11)
BUN: 13.6 mg/dL (ref 7.0–26.0)
CALCIUM: 8.7 mg/dL (ref 8.4–10.4)
CHLORIDE: 105 meq/L (ref 98–109)
CO2: 25 mEq/L (ref 22–29)
CREATININE: 0.8 mg/dL (ref 0.6–1.1)
EGFR: 82 mL/min/{1.73_m2} — ABNORMAL LOW (ref >90)
GLUCOSE: 128 mg/dL (ref 70–140)
Potassium: 3.8 mEq/L (ref 3.5–5.1)
SODIUM: 137 meq/L (ref 136–145)
Total Bilirubin: 0.59 mg/dL (ref 0.20–1.20)
Total Protein: 6.6 g/dL (ref 6.4–8.3)

## 2015-06-21 LAB — CANCER ANTIGEN 19-9: CA 19 9: 13.6 U/mL (ref <35.0)

## 2015-06-21 MED ORDER — SODIUM CHLORIDE 0.9 % IJ SOLN
10.0000 mL | INTRAMUSCULAR | Status: DC | PRN
  Administered 2015-06-21: 10 mL via INTRAVENOUS
  Filled 2015-06-21: qty 10

## 2015-06-21 MED ORDER — HEPARIN SOD (PORK) LOCK FLUSH 100 UNIT/ML IV SOLN
500.0000 [IU] | Freq: Once | INTRAVENOUS | Status: AC
  Administered 2015-06-21: 500 [IU] via INTRAVENOUS
  Filled 2015-06-21: qty 5

## 2015-06-21 NOTE — Patient Instructions (Signed)
Implanted Port Home Guide  An implanted port is a type of central line that is placed under the skin. Central lines are used to provide IV access when treatment or nutrition needs to be given through a person's veins. Implanted ports are used for long-term IV access. An implanted port may be placed because:    You need IV medicine that would be irritating to the small veins in your hands or arms.    You need long-term IV medicines, such as antibiotics.    You need IV nutrition for a long period.    You need frequent blood draws for lab tests.    You need dialysis.   Implanted ports are usually placed in the chest area, but they can also be placed in the upper arm, the abdomen, or the leg. An implanted port has two main parts:    Reservoir. The reservoir is round and will appear as a small, raised area under your skin. The reservoir is the part where a needle is inserted to give medicines or draw blood.    Catheter. The catheter is a thin, flexible tube that extends from the reservoir. The catheter is placed into a large vein. Medicine that is inserted into the reservoir goes into the catheter and then into the vein.   HOW WILL I CARE FOR MY INCISION SITE?  Do not get the incision site wet. Bathe or shower as directed by your health care Gregor Dershem.   HOW IS MY PORT ACCESSED?  Special steps must be taken to access the port:    Before the port is accessed, a numbing cream can be placed on the skin. This helps numb the skin over the port site.    Your health care Keven Soucy uses a sterile technique to access the port.   Your health care Posie Lillibridge must put on a mask and sterile gloves.   The skin over your port is cleaned carefully with an antiseptic and allowed to dry.   The port is gently pinched between sterile gloves, and a needle is inserted into the port.   Only "non-coring" port needles should be used to access the port. Once the port is accessed, a blood return should be checked. This helps  ensure that the port is in the vein and is not clogged.    If your port needs to remain accessed for a constant infusion, a clear (transparent) bandage will be placed over the needle site. The bandage and needle will need to be changed every week, or as directed by your health care Dewell Monnier.    Keep the bandage covering the needle clean and dry. Do not get it wet. Follow your health care Joanell Cressler's instructions on how to take a shower or bath while the port is accessed.    If your port does not need to stay accessed, no bandage is needed over the port.   WHAT IS FLUSHING?  Flushing helps keep the port from getting clogged. Follow your health care Nazifa Trinka's instructions on how and when to flush the port. Ports are usually flushed with saline solution or a medicine called heparin. The need for flushing will depend on how the port is used.    If the port is used for intermittent medicines or blood draws, the port will need to be flushed:    After medicines have been given.    After blood has been drawn.    As part of routine maintenance.    If a constant infusion is   running, the port may not need to be flushed.   HOW LONG WILL MY PORT STAY IMPLANTED?  The port can stay in for as long as your health care Karlen Barbar thinks it is needed. When it is time for the port to come out, surgery will be done to remove it. The procedure is similar to the one performed when the port was put in.   WHEN SHOULD I SEEK IMMEDIATE MEDICAL CARE?  When you have an implanted port, you should seek immediate medical care if:    You notice a bad smell coming from the incision site.    You have swelling, redness, or drainage at the incision site.    You have more swelling or pain at the port site or the surrounding area.    You have a fever that is not controlled with medicine.  Document Released: 09/09/2005 Document Revised: 06/30/2013 Document Reviewed: 05/17/2013  [employer] Patient Information 2015 [employer]. This  information is not intended to replace advice given to you by your health care Terressa Evola. Make sure you discuss any questions you have with your health care Sareena Odeh.

## 2015-06-21 NOTE — Progress Notes (Signed)
[facility] OFFICE PROGRESS NOTE   Diagnosis: Pancreas cancer  INTERVAL HISTORY:   [name] returns as scheduled. She continues to have intermittent gastrointestinal upset and irregular bowel habits. She is taking pancreatic enzyme replacement. Good appetite and energy level.  Objective:  Vital signs in last 24 hours:  Blood pressure 116/62, pulse 50, temperature 97.9 F (36.6 C), temperature source Oral, resp. rate 18, height 5\' 2"  (1.575 m), weight 157 lb 3.2 oz (71.305 kg), SpO2 100 %.    HEENT: Neck without mass Lymphatics: No cervical, supraclavicular, right axillary, or inguinal nodes. Prominent left axillary fat pad versus a soft mobile 1-2 cm medial node. Resp: Lungs with mild wheezing at the left posterior chest that cleared after several respirations Cardio: Regular rate and rhythm GI: No hepatosplenomegaly, no mass, nontender Vascular: No leg edema   Portacath/PICC-without erythema  Lab Results:  Lab Results  Component Value Date   WBC 3.7* 06/21/2015   HGB 11.5* 06/21/2015   HCT 34.2* 06/21/2015   MCV 87.7 06/21/2015   PLT 192 06/21/2015   NEUTROABS 1.7 06/21/2015   CA 19-9 on 05/10/2015-14.2  Medications: I have reviewed the patient's current medications.  Assessment/Plan: 1. 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  Gemcitabine held 11/02/2014 due to neutropenia.  Gemcitabine 11/09/2014 dose reduced 800 mg/m.  Gemcitabine held 11/16/2014 due to neutropenia.  Gemcitabine resumed 11/23/2014 every 2 week schedule.  Cycle 6 gemcitabine 01/05/2015  Cycle 7 gemcitabine 01/18/2015  Cycle 8  gemcitabine 02/01/2015  Cycle 9 gemcitabine 02/15/2015  Cycle 10 gemcitabine 03/01/2015  Cycle 11 gemcitabine 03/15/2015  Cycle 12 gemcitabine 03/29/2015  2. 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 Admission with post ERCP pancreatitis 06/16/2014  4. History of abdominal pain secondary to #1  5. Pulmonary embolism diagnosed on a CT of the abdomen 09/16/2014  Negative lower extremity Dopplers 09/17/2014  6. Multiple orthopedic surgical procedures  7. Endometrial cancer,stage IA, grade 1 endometrioid adenocarcinoma, 18% myometrial invasion, no lymphovascular space involvement, negative washings  Status post robotic total hysterectomy and bilateral salpingo-oophorectomy 11/30/2010  Recurrent tumor left lateral vagina status post biopsy 11/24/2014 with pathology confirming adenocarcinoma with focal squamous differentiation consistent with endometrial adenocarcinoma  Staging CT scans 12/06/2014 with no evidence of local pancreatic cancer recurrence. Small fluid collection adjacent to the left adrenal gland. Severe hepatic steatosis. No evidence of local extension of endometrial carcinoma. Carcinoma not well-defined at the vaginal cuff. 5 mm right external iliac lymph node. 3.6 mm left external iliac lymph node  Brachytherapy initiated 12/22/2014, completed 01/19/2015  8. History of atrial fibrillation-maintained on xarelto  9. Family history of multiple cancers-negative CancerNext gene panel  10. Prolonged nausea following the pancreaticoduodenectomy. Improved 10/26/2014.  11. Port-A-Cath placement 10/21/2014.  12. History of Neutropenia secondary to chemotherapy   13. Diarrhea. Question pancreatic insufficiency. Pancreatic enzyme replacement initiated 01/05/2015.  14. History of positional vertigo-resolved    Disposition:  She remains in clinical remission from pancreas and endometrial cancer. She  continues follow-up of the endometrial cancer with Dr. Sondra Come and GYN oncology. We will leave the Port-A-Cath in place for a few more months. She will return for a Port-A-Cath flush in 6 weeks. She  is scheduled for an office visit in approximately 12 weeks. We will follow-up on the CA 19-9 from today.  Betsy Coder, MD  06/21/2015  9:49 AM

## 2015-06-21 NOTE — Telephone Encounter (Signed)
Pt confirmed labs/ov per 09/28 POF, gave pt AVS and Calendar... KJ °

## 2015-07-19 ENCOUNTER — Other Ambulatory Visit: Payer: Self-pay | Admitting: Cardiology

## 2015-07-20 ENCOUNTER — Telehealth: Payer: Self-pay | Admitting: *Deleted

## 2015-07-20 NOTE — Telephone Encounter (Signed)
Patient called and left a voice message stating,"I need some advice from Dr. Gearldine Shown nurse. For the past 3-4 days, I've had trouble with my back and hip bones. I'm wondering if the cancer is in my bones now? Do I need to make an appointment with Dr. [name] or a scan?" Return number is [phone].

## 2015-07-21 NOTE — Telephone Encounter (Signed)
Pt reports that she has had "Left hip bone pain for 3 weeks now and it's been hard to walk; also my back pain on my right side has been hurting for about a week"  Pt states she is drinking plenty and doesn't think it a UTI; does have hx of "arthritis in hips and arms"  "I'm just so paranoid, once you have cancer you think with every pain you have that it's coming back"  Per Dr. [name]; notified pt that MD is out of office today but was informed of symptoms.  Instructions given to call PCP for evaluation and if PCP thinks it's cancer related--will move up 09/06/15 appt.  Pt verbalized understanding of information and expressed appreciation for call.

## 2015-07-24 ENCOUNTER — Other Ambulatory Visit (HOSPITAL_COMMUNITY): Payer: Self-pay | Admitting: Physician Assistant

## 2015-07-24 ENCOUNTER — Ambulatory Visit (HOSPITAL_COMMUNITY)
Admission: RE | Admit: 2015-07-24 | Discharge: 2015-07-24 | Disposition: A | Discharge status: Home / Self Care | Payer: Medicare Other | Source: Ambulatory Visit | Attending: Physician Assistant | Admitting: Physician Assistant

## 2015-07-24 ENCOUNTER — Encounter (HOSPITAL_COMMUNITY): Payer: Self-pay

## 2015-07-24 DIAGNOSIS — K760 Fatty (change of) liver, not elsewhere classified: Secondary | ICD-10-CM | POA: Insufficient documentation

## 2015-07-24 DIAGNOSIS — K863 Pseudocyst of pancreas: Secondary | ICD-10-CM | POA: Diagnosis not present

## 2015-07-24 DIAGNOSIS — C541 Malignant neoplasm of endometrium: Secondary | ICD-10-CM

## 2015-07-24 MED ORDER — IOHEXOL 300 MG/ML  SOLN
100.0000 mL | Freq: Once | INTRAMUSCULAR | Status: AC | PRN
  Administered 2015-07-24: 100 mL via INTRAVENOUS

## 2015-07-24 MED ORDER — IOHEXOL 300 MG/ML  SOLN
50.0000 mL | Freq: Once | INTRAMUSCULAR | Status: AC | PRN
  Administered 2015-07-24: 50 mL via ORAL

## 2015-07-25 ENCOUNTER — Telehealth: Payer: Self-pay | Admitting: *Deleted

## 2015-07-25 ENCOUNTER — Telehealth: Payer: Self-pay | Admitting: Gynecologic Oncology

## 2015-07-25 NOTE — Telephone Encounter (Signed)
TC to patient regarding her CT scan. She is not having any bleeding. Her only complaint is back pain and hard to know if acute or chronic. She primarily has  Flare of her pain with BMs. I will alert Dr. Sondra Come to this finding so that he can pay particular attention to the apex of the vagina when he sees her next week. PG

## 2015-07-25 NOTE — Telephone Encounter (Signed)
-----   Message from Ladell Pier, MD sent at 07/24/2015  8:23 PM EDT ----- Please call patient, no evidence of pancreas cancer, soft tissue density at vaginal apex likely due to xrt F/u as scheduled

## 2015-07-25 NOTE — Telephone Encounter (Signed)
Left message with husband to have pt call the office for CT results. No evidence of pancreas cancer. He voiced understanding.

## 2015-07-27 ENCOUNTER — Other Ambulatory Visit: Payer: Self-pay | Admitting: Cardiology

## 2015-08-01 ENCOUNTER — Encounter: Payer: Self-pay | Admitting: Cardiology

## 2015-08-01 ENCOUNTER — Ambulatory Visit (INDEPENDENT_AMBULATORY_CARE_PROVIDER_SITE_OTHER): Payer: Medicare Other | Admitting: Cardiology

## 2015-08-01 VITALS — BP 110/70 | HR 45 | Ht 62.0 in | Wt 160.0 lb

## 2015-08-01 DIAGNOSIS — I480 Paroxysmal atrial fibrillation: Secondary | ICD-10-CM | POA: Diagnosis not present

## 2015-08-01 MED ORDER — RIVAROXABAN 20 MG PO TABS: 20.0000 mg | ORAL_TABLET | Freq: Every day | ORAL | Status: DC

## 2015-08-01 NOTE — Patient Instructions (Signed)
Medication Instructions:  No changes today  Labwork: None today  Testing/Procedures: None today  Follow-Up: Your physician wants you to follow-up in: 6 months with Dr McLean. (May 2017).  You will receive a reminder letter in the mail two months in advance. If you don't receive a letter, please call our office to schedule the follow-up appointment.        If you need a refill on your cardiac medications before your next appointment, please call your pharmacy.   

## 2015-08-02 ENCOUNTER — Ambulatory Visit
Admission: RE | Admit: 2015-08-02 | Discharge: 2015-08-02 | Disposition: A | Discharge status: Home / Self Care | Payer: Medicare Other | Source: Ambulatory Visit | Attending: Radiation Oncology | Admitting: Radiation Oncology

## 2015-08-02 ENCOUNTER — Ambulatory Visit (HOSPITAL_BASED_OUTPATIENT_CLINIC_OR_DEPARTMENT_OTHER): Payer: Medicare Other

## 2015-08-02 ENCOUNTER — Encounter: Payer: Self-pay | Admitting: Radiation Oncology

## 2015-08-02 ENCOUNTER — Telehealth: Payer: Self-pay | Admitting: *Deleted

## 2015-08-02 VITALS — BP 116/88 | HR 52 | Temp 98.0°F | Resp 16

## 2015-08-02 VITALS — BP 136/63 | HR 49 | Temp 97.9°F | Resp 18 | Ht 62.0 in | Wt 160.9 lb

## 2015-08-02 DIAGNOSIS — Z8507 Personal history of malignant neoplasm of pancreas: Secondary | ICD-10-CM | POA: Diagnosis not present

## 2015-08-02 DIAGNOSIS — Z452 Encounter for adjustment and management of vascular access device: Secondary | ICD-10-CM

## 2015-08-02 DIAGNOSIS — I480 Paroxysmal atrial fibrillation: Secondary | ICD-10-CM | POA: Insufficient documentation

## 2015-08-02 DIAGNOSIS — C250 Malignant neoplasm of head of pancreas: Secondary | ICD-10-CM

## 2015-08-02 DIAGNOSIS — C541 Malignant neoplasm of endometrium: Secondary | ICD-10-CM

## 2015-08-02 DIAGNOSIS — Z95828 Presence of other vascular implants and grafts: Secondary | ICD-10-CM

## 2015-08-02 MED ORDER — HEPARIN SOD (PORK) LOCK FLUSH 100 UNIT/ML IV SOLN
500.0000 [IU] | Freq: Once | INTRAVENOUS | Status: AC
  Administered 2015-08-02: 500 [IU] via INTRAVENOUS
  Filled 2015-08-02: qty 5

## 2015-08-02 MED ORDER — SODIUM CHLORIDE 0.9 % IJ SOLN
10.0000 mL | INTRAMUSCULAR | Status: DC | PRN
Start: 2015-08-02 — End: 2015-08-02
  Administered 2015-08-02: 10 mL via INTRAVENOUS
  Filled 2015-08-02: qty 10

## 2015-08-02 NOTE — Progress Notes (Signed)
[name] here for follow up.  She reports pain in her lower back that started two weeks ago.  She reports it hurts when she straightens up.  She denies having any bladder issues.  She reports constipation with her last bowel movement 2 days ago.  She reports that she took 2 tablespoons of epsom salts on Monday.  She denies having any vaginal bleeding.  She is using her vaginal dilator twice a week.  She reports her appetite and energy level is good.  BP 136/63 mmHg  Pulse 49  Temp(Src) 97.9 F (36.6 C) (Oral)  Resp 18  Ht 5\' 2"  (1.575 m)  Wt 160 lb 14.4 oz (72.984 kg)  BMI 29.42 kg/m2   Wt Readings from Last 3 Encounters:  08/02/15 160 lb 14.4 oz (72.984 kg)  08/01/15 160 lb (72.576 kg)  06/21/15 157 lb 3.2 oz (71.305 kg)

## 2015-08-02 NOTE — Patient Instructions (Signed)

## 2015-08-02 NOTE — Telephone Encounter (Signed)
CALLED PATIENT TO INFORM OF PET SCAN ON 08-11-15 - ARRIVAL TIME - 11:30 AM @ [facility], SPOKE WITH PATIENT AND SHE IS AWARE OF THIS TEST

## 2015-08-02 NOTE — Progress Notes (Addendum)
Radiation Oncology         [phone] ________________________________  Name: [name] MRN: [id number]  Date: 08/02/2015  DOB: 1946/09/18  Follow-Up Visit Note  CC: Garth Bigness, MD    ICD-9-CM ICD-10-CM   1. Endometrial ca (HCC) 182.0 C54.1 NM PET Image Restag (PS) Skull Base To Thigh  2. Cancer of head of pancreas-S/P Whipple 08/30/14 157.0 C25.0 NM PET Image Restag (PS) Skull Base To Thigh    Diagnosis:   Recurrent endometrial cancer  Interval Since Last Radiation:  6 months  Narrative:  [name] here for follow up on vaginal brachytherapy for recurrent endometrial cancer. Patient has been developing right flank pain, she was seen by her primary care physician. CT scan was performed which showed no new findings except for a possible 3-4 cm soft tissue mass in the vaginal apex, this was not seen in her December CT scan. No suspicious changes in the right abdomen to explain her pain. She also complains of pain in her left hip area. She continues to use her vaginal dilator twice a week with minimal bleeding after use. She otherwise denies hematuria, other vaginal bleeding or rectal bleeding.  She reports it hurts when she straightens up. She denies having any bladder issues. She reports constipation with her last bowel movement 2 days ago. She reports that she took 2 tablespoons of epsom salts on Monday.She reports her appetite and energy level is good.    ALLERGIES:  is allergic to ace inhibitors; codeine; scopolamine; and sulfa antibiotics.  Meds: Current Outpatient Prescriptions  Medication Sig Dispense Refill  . acetaminophen (TYLENOL) 500 MG tablet Take 1,000 mg by mouth every 6 (six) hours as needed for pain.     . Eszopiclone 3 MG TABS Take 1.5 mg by mouth at bedtime.     . flecainide (TAMBOCOR) 100 MG tablet TAKE 1 TABLET BY MOUTH TWICE A DAY 180 tablet 0  . fluticasone (FLONASE) 50 MCG/ACT nasal spray Place 1 spray into both  nostrils 2 (two) times daily as needed.     . lidocaine-prilocaine (EMLA) cream Apply small amount over port area 1-2 hours prior to treatment and cover with plastic wrap.  DO NOT RUB IN. 30 g prn  . lipase/protease/amylase (CREON) 12000 UNITS CPEP capsule Take 2 capsules (24,000 Units total) by mouth 3 (three) times daily before meals. 180 capsule 2  . LORazepam (ATIVAN) 1 MG tablet Take 0.5 tablets (0.5 mg total) by mouth every 8 (eight) hours as needed for anxiety. 45 tablet 0  . losartan (COZAAR) 50 MG tablet TAKE 1 TABLET BY MOUTH EVERY DAY 30 tablet 11  . metoprolol succinate (TOPROL-XL) 25 MG 24 hr tablet Take 1/2 tablet by mouth daily    . Multiple Vitamins-Minerals (CENTRUM SILVER PO) Take 1 tablet by mouth every morning.     Marland Kitchen omeprazole (PRILOSEC) 20 MG capsule Take 2 capsules (40 mg total) by mouth 2 (two) times daily before a meal. 60 capsule 6  . ondansetron (ZOFRAN) 4 MG tablet Take 1 tablet (4 mg total) by mouth every 6 (six) hours as needed for nausea or vomiting. 30 tablet 3  . promethazine (PHENERGAN) 12.5 MG tablet Take 12.5 mg by mouth every 6 (six) hours as needed for nausea.     . rivaroxaban (XARELTO) 20 MG TABS tablet Take 1 tablet (20 mg total) by mouth daily. 90 tablet 1  . vitamin C (ASCORBIC ACID) 500 MG tablet Take 500 mg by mouth  every morning.     . meclizine (ANTIVERT) 32 MG tablet Take 1 tablet (32 mg total) by mouth 3 (three) times daily as needed. (Patient not taking: Reported on 08/02/2015) 30 tablet 0   No current facility-administered medications for this encounter.   Facility-Administered Medications Ordered in Other Encounters  Medication Dose Route Frequency Provider Last Rate Last Dose  . sodium chloride 0.9 % bolus 1,000 mL  1,000 mL Intravenous Once Coralie Keens, MD        Physical Findings: The patient is in no acute distress. Patient is alert and oriented.  height is 5\' 2"  (1.575 m) and weight is 160 lb 14.4 oz (72.984 kg). Her oral temperature  is 97.9 F (36.6 C). Her blood pressure is 136/63 and her pulse is 49. Her respiration is 18. . General: Alert and oriented, in no acute distress HEENT: Head is normocephalic. Extraocular movements are intact. Oropharynx is clear. Neck: Neck is supple, no palpable cervical or supraclavicular lymphadenopathy. Heart: Regular in rate and rhythm with no murmurs, rubs, or gallops. Chest: Clear to auscultation bilaterally, with no rhonchi, wheezes, or rales. Abdomen: Soft, nontender, nondistended, with no rigidity or guarding. Extremities: No cyanosis or edema. Lymphatics: see Neck Exam Skin: No concerning lesions. Psychiatric: Judgment and insight are intact. Affect is appropriate. Pelvic: External gentialia unremarkable.Speculum exam was performed: light brown discharge in proximal vagina, no bleeding , no mucosal lesions, On bimanual and rectovaginal exam: no pelvic masses are appreciated, soft stool in the rectal vault.   Lab Findings: Lab Results  Component Value Date   WBC 3.7* 06/21/2015   HGB 11.5* 06/21/2015   HCT 34.2* 06/21/2015   MCV 87.7 06/21/2015   PLT 192 06/21/2015    Radiographic Findings: Ct Abdomen Pelvis W Contrast  07/24/2015  CLINICAL DATA:  Acute onset of right flank pain radiating into the right groin. Personal history of pancreatic cancer in 2015 for which the patient on when a Whipple procedure. Personal history of endometrial cancer in 2012 for which the patient underwent hysterectomy. Personal history of ischemic colitis and gastritis. EXAM: CT ABDOMEN AND PELVIS WITH CONTRAST TECHNIQUE: Multidetector CT imaging of the abdomen and pelvis was performed using the standard protocol following bolus administration of intravenous contrast. CONTRAST:  163mL OMNIPAQUE IOHEXOL 300 MG/ML IV. Oral contrast was also administered. COMPARISON:  Multiple prior CT abdomen and pelvis examinations, most recently CT abdomen 12/06/2014. FINDINGS: Hepatobiliary: Severe diffuse hepatic  steatosis with scattered areas of focal sparing, unchanged from the most recent prior CT. No focal hepatic parenchymal abnormality to suggest metastasis. Surgically absent gallbladder. No biliary ductal dilation. Spleen:  Normal in size and appearance. Pancreas: Surgically absent head and uncinate related to the prior Whipple procedure. Marked atrophy with fatty replacement of the body and tail of the pancreas. Approximate 1.9 x 1.4 cm cyst arising from the tail of the pancreas, decreased in size since the most recent prior CT. No new abnormalities involving the residual pancreas. Adrenal glands:  Normal in appearance. Genitourinary: Both kidneys normal in size and appearance. Extrarenal pelvis on the right as noted previously. No urinary tract calculi on either side. No focal parenchymal abnormality involving either kidney. Normal-appearing urinary bladder. Uterus surgically absent. Approximate 2.7 x 4.1 cm soft tissue focus involving the vaginal apex, not present on the most recent prior examination. No adnexal masses or free pelvic fluid. Gastrointestinal: Prior Whipple procedure. Widely patent gastrojejunostomy. Gastric remnant normal in appearance. Inspissated stool like material involving the distal and terminal ileum. Normal-appearing small  bowel otherwise. Moderate colonic stool burden. Scattered sigmoid colon diverticula without evidence of acute diverticulitis. Normal-appearing decompressed appendix in the right mid abdomen. Ascites:  Absent. Vascular: Mild abdominal aortic atherosclerosis without aneurysm. Visceral arteries widely patent. Lymphatic:  No pathologic lymphadenopathy in the abdomen or pelvis. Other findings: Numerous pelvic phleboliths. Musculoskeletal: Osseous demineralization. Severe disc space narrowing and endplate hypertrophic changes at L5-S1. Compression fractures involving the upper endplates of B02 and X11, unchanged. Facet degenerative changes diffusely throughout the lumbar spine.  Visualized lower thorax: Heart size upper normal to borderline enlarged, unchanged. Visualized lung bases clear. IMPRESSION: 1. No acute abnormalities involving the abdomen or pelvis. 2. Approximate 3 x 4 cm soft tissue focus involving the vaginal apex, not visible on the most recent prior CT pelvis in December, 2015. Please correlate with clinical pelvic examination to exclude a mass. It may just reflect collapsed mucosa. 3. No evidence of recurrent or metastatic pancreatic cancer. 4. Stable marked atrophy involving the remaining body and tail of the pancreas. 5. Interval decrease in size of the pseudocyst involving the tail of the pancreas. 6. Stable severe diffuse hepatic steatosis with focal areas of sparing. 7. Inspissated stool like material within normal appearing distal and terminal ileum, likely due to stasis. 8. No complicating features post Whipple procedure. Electronically Signed   By: Evangeline Dakin M.D.   On: 07/24/2015 18:29    Impression: Questionable soft tissue mass in the vaginal apex. CT images were reviewed in radiology. Current recommendations are for PET scan to further evaluate the issue. The patient wishes to pursue further workup of this questionable area in light of her prior history of pancreatic cancer as well as recurrent endometrial cancer.  Plan: Assuming she is without issue, she will return for a 6 month follow up with radiation oncology. She will follow up with Dr.Gehrig during the interval. She will continue follow-up with Dr. [name] concerning her pancreatic cancer.  -----------------------------------  Blair Promise, PhD, MD  This document serves as a record of services personally performed by Gery Pray, MD. It was created on his behalf by Derek Mound, a trained medical scribe. The creation of this record is based on the scribe's personal observations and the provider's statements to them. This document has been checked and approved by the attending  provider.

## 2015-08-02 NOTE — Progress Notes (Signed)
Patient ID: [name], female   DOB: 1946-05-03, 69 y.o.   MRN: [id number] PCP: Mattie Marlin  69 yo presents for followup of symptomatic paroxysmal atrial fibrillation.  She had breakthrough atrial fibrillation episodes on dronedarone. When she is in atrial fibrillation, she feels "bad" overall. I stopped dronedarone and had her start flecainide instead.  After increasing flecainide to 100 mg bid, she had significantly few episodes of atrial fibrillation.   Since last appointment, [name] had a successful Whipple procedure for pancreatic cancer.  Post-operatively, she had a PE.  She was then found in 2/16 to have recurrent endometrial cancer.  She has been getting brachytherapy for this.    Currently doing well.  She has 4-5 episodes of atrial fibrillation in the last 6 months.  These episodes will usually last 2-3 hours then resolve.  She is in NSR today.  HR is in the upper 40s but she denies lightheadedness or syncope.  Toprol XL has already been cut back to 12.5 mg daily.  No exertional dyspnea. No chest pain.    ECG: sinus bradycardia at 45 bpm   Labs (9/14): K 3.9, creatinine 0.7 Labs (5/15): HCT 36.6 Labs (9/15): HCT 28.5 Labs (10/15): K 4.1, creatinine 0.7 Labs (9/16): K 3.8, creatinine 0.8  PMH: 1. Atrial fibrillation: Paroxysmal, first noted in 1/13.  Echo (2/13) with EF 65%, mild MR.  Offered atrial fibrillation ablation by Dr. Rayann Heman but decided to continue antiarrhythmic management.  Echo (11/15) with EF 60-65%, mild AI, normal RV size and systolic function.  She failed Multaq and is now on flecainide.  2. Osteoarthritis: Shoulder replacement in 5/09. TKR in 2014.  3. H/o traumatic c-spine fracture.  4. Endometrial cancer in 3/12.  Hysterectomy. Recurrence in 2/16, now getting brachytherapy.  5. Stress echo in 9/09 was normal, Lexiscan myoview in 2/13 showed no ischemia or infarction.  6. HTN: ACEI cough.  7. Pancreatic adenocarcinoma: Diagnosis in 11/15 by FNA  pancreatic head.  She had a bile duct stricture initially diagnosed.  Whipple procedure in 12/15, then chemotherapy with gemcitabine.  8. PE: 12/15, post-op Whipple.   SH: Lives in [place], married, does not work, no smoking.    FH: CAD, CHF in father; CVA in mother.   ROS: All systems reviewed and negative except as per HPI.   Current Outpatient Prescriptions  Medication Sig Dispense Refill  . acetaminophen (TYLENOL) 500 MG tablet Take 1,000 mg by mouth every 6 (six) hours as needed for pain.     . Eszopiclone 3 MG TABS Take 1.5 mg by mouth at bedtime.     . flecainide (TAMBOCOR) 100 MG tablet TAKE 1 TABLET BY MOUTH TWICE A DAY 180 tablet 0  . fluticasone (FLONASE) 50 MCG/ACT nasal spray Place 1 spray into both nostrils 2 (two) times daily as needed.     . lidocaine-prilocaine (EMLA) cream Apply small amount over port area 1-2 hours prior to treatment and cover with plastic wrap.  DO NOT RUB IN. 30 g prn  . LORazepam (ATIVAN) 1 MG tablet Take 0.5 tablets (0.5 mg total) by mouth every 8 (eight) hours as needed for anxiety. 45 tablet 0  . losartan (COZAAR) 50 MG tablet TAKE 1 TABLET BY MOUTH EVERY DAY 30 tablet 11  . meclizine (ANTIVERT) 32 MG tablet Take 1 tablet (32 mg total) by mouth 3 (three) times daily as needed. (Patient taking differently: Take 25 mg by mouth 3 (three) times daily as needed. ) 30 tablet 0  .  metoprolol succinate (TOPROL-XL) 25 MG 24 hr tablet Take 1/2 tablet by mouth daily    . Multiple Vitamins-Minerals (CENTRUM SILVER PO) Take 1 tablet by mouth every morning.     Marland Kitchen omeprazole (PRILOSEC) 20 MG capsule Take 2 capsules (40 mg total) by mouth 2 (two) times daily before a meal. 60 capsule 6  . rivaroxaban (XARELTO) 20 MG TABS tablet Take 1 tablet (20 mg total) by mouth daily. 90 tablet 1  . vitamin C (ASCORBIC ACID) 500 MG tablet Take 500 mg by mouth every morning.     . lipase/protease/amylase (CREON) 12000 UNITS CPEP capsule Take 2 capsules (24,000 Units total) by  mouth 3 (three) times daily before meals. 180 capsule 2  . ondansetron (ZOFRAN) 4 MG tablet Take 1 tablet (4 mg total) by mouth every 6 (six) hours as needed for nausea or vomiting. (Patient not taking: Reported on 04/26/2015) 30 tablet 3  . promethazine (PHENERGAN) 12.5 MG tablet Take 12.5 mg by mouth every 6 (six) hours as needed for nausea.      No current facility-administered medications for this visit.   Facility-Administered Medications Ordered in Other Visits  Medication Dose Route Frequency Provider Last Rate Last Dose  . sodium chloride 0.9 % bolus 1,000 mL  1,000 mL Intravenous Once Coralie Keens, MD        BP 110/70 mmHg  Pulse 45  Ht 5\' 2"  (1.575 m)  Wt 160 lb (72.576 kg)  BMI 29.26 kg/m2 General: NAD, obese Neck: No JVD, no thyromegaly or thyroid nodule.  Lungs: Clear to auscultation bilaterally with normal respiratory effort. CV: Nondisplaced PMI.  Heart regular S1/S2, no S3/S4, 1/6 SEM RUSB.  No edema.  No carotid bruit.  Normal pedal pulses.  Abdomen: Soft, nontender, no hepatosplenomegaly, no distention.  Neurologic: Alert and oriented x 3.  Psych: Normal affect. Extremities: No clubbing or cyanosis.   Assessment/Plan: 1. Atrial fibrillation: Paroxysmal. She is in NSR today.  She was offered atrial fibrillation ablation in the past but decided to continue medical management.  She failed Multaq and is on flecainide.  She has had 4-5 brief episodes of suspected atrial fibrillation in the last 6 months.  - Continue flecainide 100 mg bid (she has normal EF and had a negative stress test in 2/13) and Toprol XL at low dose. She has sinus bradycardia but is asymptomatic so will not change regimen at this time.  - Continue Xarelto.  2. HTN:  BP is controlled on current regimen.   3. H/o PE: Post-operative.  She is on Xarelto.    [name] 08/02/2015

## 2015-08-11 ENCOUNTER — Encounter (HOSPITAL_COMMUNITY)
Admission: RE | Admit: 2015-08-11 | Discharge: 2015-08-11 | Disposition: A | Discharge status: Home / Self Care | Payer: Medicare Other | Source: Ambulatory Visit | Attending: Radiation Oncology | Admitting: Radiation Oncology

## 2015-08-11 DIAGNOSIS — C541 Malignant neoplasm of endometrium: Secondary | ICD-10-CM | POA: Diagnosis not present

## 2015-08-11 DIAGNOSIS — C250 Malignant neoplasm of head of pancreas: Secondary | ICD-10-CM | POA: Insufficient documentation

## 2015-08-11 LAB — GLUCOSE, CAPILLARY: GLUCOSE-CAPILLARY: 87 mg/dL (ref 65–99)

## 2015-08-11 MED ORDER — FLUDEOXYGLUCOSE F - 18 (FDG) INJECTION
8.7000 | Freq: Once | INTRAVENOUS | Status: DC | PRN
  Administered 2015-08-11: 8.7 via INTRAVENOUS
  Filled 2015-08-11: qty 8.7

## 2015-09-02 ENCOUNTER — Other Ambulatory Visit: Payer: Self-pay | Admitting: Cardiology

## 2015-09-06 ENCOUNTER — Ambulatory Visit (HOSPITAL_BASED_OUTPATIENT_CLINIC_OR_DEPARTMENT_OTHER): Payer: Medicare Other | Admitting: Oncology

## 2015-09-06 ENCOUNTER — Ambulatory Visit (HOSPITAL_BASED_OUTPATIENT_CLINIC_OR_DEPARTMENT_OTHER): Payer: Medicare Other

## 2015-09-06 ENCOUNTER — Telehealth: Payer: Self-pay | Admitting: Oncology

## 2015-09-06 ENCOUNTER — Other Ambulatory Visit (HOSPITAL_BASED_OUTPATIENT_CLINIC_OR_DEPARTMENT_OTHER): Payer: Medicare Other

## 2015-09-06 VITALS — BP 115/58 | HR 54 | Temp 97.9°F | Resp 20 | Ht 62.0 in | Wt 161.7 lb

## 2015-09-06 DIAGNOSIS — Z8544 Personal history of malignant neoplasm of other female genital organs: Secondary | ICD-10-CM

## 2015-09-06 DIAGNOSIS — Z8507 Personal history of malignant neoplasm of pancreas: Secondary | ICD-10-CM

## 2015-09-06 DIAGNOSIS — Z95828 Presence of other vascular implants and grafts: Secondary | ICD-10-CM

## 2015-09-06 DIAGNOSIS — C250 Malignant neoplasm of head of pancreas: Secondary | ICD-10-CM

## 2015-09-06 DIAGNOSIS — Z452 Encounter for adjustment and management of vascular access device: Secondary | ICD-10-CM

## 2015-09-06 LAB — COMPREHENSIVE METABOLIC PANEL
ALBUMIN: 3.7 g/dL (ref 3.5–5.0)
ALK PHOS: 159 U/L — AB (ref 40–150)
ALT: 46 U/L (ref 0–55)
AST: 51 U/L — AB (ref 5–34)
Anion Gap: 10 mEq/L (ref 3–11)
BILIRUBIN TOTAL: 0.59 mg/dL (ref 0.20–1.20)
BUN: 12.2 mg/dL (ref 7.0–26.0)
CALCIUM: 8.9 mg/dL (ref 8.4–10.4)
CO2: 22 mEq/L (ref 22–29)
CREATININE: 0.8 mg/dL (ref 0.6–1.1)
Chloride: 105 mEq/L (ref 98–109)
EGFR: 80 mL/min/{1.73_m2} — ABNORMAL LOW (ref >90)
GLUCOSE: 135 mg/dL (ref 70–140)
Potassium: 3.8 mEq/L (ref 3.5–5.1)
SODIUM: 137 meq/L (ref 136–145)
TOTAL PROTEIN: 6.8 g/dL (ref 6.4–8.3)

## 2015-09-06 LAB — CANCER ANTIGEN 19-9: CA 19-9: 35 U/mL — ABNORMAL HIGH (ref <35.0)

## 2015-09-06 MED ORDER — HEPARIN SOD (PORK) LOCK FLUSH 100 UNIT/ML IV SOLN
500.0000 [IU] | Freq: Once | INTRAVENOUS | Status: AC
  Administered 2015-09-06: 500 [IU] via INTRAVENOUS
  Filled 2015-09-06: qty 5

## 2015-09-06 MED ORDER — SODIUM CHLORIDE 0.9 % IJ SOLN
10.0000 mL | INTRAMUSCULAR | Status: DC | PRN
  Administered 2015-09-06: 10 mL via INTRAVENOUS
  Filled 2015-09-06: qty 10

## 2015-09-06 NOTE — Progress Notes (Signed)
[facility] OFFICE PROGRESS NOTE   Diagnosis: Pancreas cancer, uterine cancer  INTERVAL HISTORY:   [name] returns as scheduled. She reports right flank discomfort for the past few months. The pain is intermittent. She takes hydrocodone as needed. No associated symptoms. No dysuria. A CT of the abdomen and pelvis on 07/24/2015 revealed no acute abnormality. A 3 x 4 cm soft tissue focus was noted at the vaginal apex. A PET scan 08/11/2015 revealed no evidence of metastatic disease. No mass lesion or abnormal metabolic activity at the vaginal cuff. No evidence of bone metastases.  She reports a good appetite and energy level.  Objective:  Vital signs in last 24 hours:  Blood pressure 115/58, pulse 54, temperature 97.9 F (36.6 C), temperature source Oral, resp. rate 20, height 5\' 2"  (1.575 m), weight 161 lb 11.2 oz (73.347 kg), SpO2 99 %.    HEENT: Neck without mass Lymphatics: No cervical, supra-clavicular, axillary, or inguinal nodes Resp: Lungs clear bilaterally Cardio: Regular rate and rhythm GI: No hepatomegaly, no mass, mild tenderness in the right upper abdomen Vascular: No leg edema Musculoskeletal: Mild tenderness at the right flank. No mass.    Portacath/PICC-without erythema  Lab Results:  CA 19-9 pending   Imaging:  No results found.  Medications: I have reviewed the patient's current medications.  Assessment/Plan: 1. 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  Gemcitabine held 11/02/2014 due to neutropenia.  Gemcitabine 11/09/2014 dose reduced 800 mg/m.  Gemcitabine held 11/16/2014 due to  neutropenia.  Gemcitabine resumed 11/23/2014 every 2 week schedule.  Cycle 6 gemcitabine 01/05/2015  Cycle 7 gemcitabine 01/18/2015  Cycle 8 gemcitabine 02/01/2015  Cycle 9 gemcitabine 02/15/2015  Cycle 10 gemcitabine 03/01/2015  Cycle 11 gemcitabine 03/15/2015  Cycle 12 gemcitabine 03/29/2015  2. 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 Admission with post ERCP pancreatitis 06/16/2014  4. History of abdominal pain secondary to #1  5. Pulmonary embolism diagnosed on a CT of the abdomen 09/16/2014  Negative lower extremity Dopplers 09/17/2014  6. Multiple orthopedic surgical procedures  7. Endometrial cancer,stage IA, grade 1 endometrioid adenocarcinoma, 18% myometrial invasion, no lymphovascular space involvement, negative washings  Status post robotic total hysterectomy and bilateral salpingo-oophorectomy 11/30/2010  Recurrent tumor left lateral vagina status post biopsy 11/24/2014 with pathology confirming adenocarcinoma with focal squamous differentiation consistent with endometrial adenocarcinoma  Staging CT scans 12/06/2014 with no evidence of local pancreatic cancer recurrence. Small fluid collection adjacent to the left adrenal gland. Severe hepatic steatosis. No evidence of local extension of endometrial carcinoma. Carcinoma not well-defined at the vaginal cuff. 5 mm right external iliac lymph node. 3.6 mm left external iliac lymph node  Brachytherapy initiated 12/22/2014, completed 01/19/2015  CT abdomen/pelvis 07/24/2015 revealed a 3 x 4 cm soft tissue focus at the vaginal apex  PET scan 08/11/2015 revealed no mass at the vaginal apex and no evidence of metastatic disease  8. History of atrial fibrillation-maintained on xarelto  9. Family history of multiple cancers-negative CancerNext gene panel  10. Prolonged nausea following the pancreaticoduodenectomy. Improved 10/26/2014.  11.  Port-A-Cath placement 10/21/2014.  12. History of Neutropenia secondary to chemotherapy   13. Diarrhea. Question pancreatic insufficiency. Pancreatic enzyme replacement  initiated 01/05/2015.  14. History of positional vertigo-resolved   Disposition:  [name] remains in clinical remission from pancreas cancer and uterine cancer. We decided to keep the Port-A-Cath in place until she is one year out from completing treatment or recurrent uterine cancer. The right flank discomfort is likely benign musculoskeletal pain. She will contact us if the pain worsens.  [name] will return for a Port-A-Cath flush in 6 weeks and an office visit in 12 weeks. She is scheduled to see GYN oncology in February.  Savannah Coder, [name]  09/06/2015  9:19 AM

## 2015-09-06 NOTE — Telephone Encounter (Signed)
Gave and printed appt sched and avs for pt for Jan adn March

## 2015-09-06 NOTE — Patient Instructions (Signed)

## 2015-09-07 ENCOUNTER — Telehealth: Payer: Self-pay | Admitting: *Deleted

## 2015-09-07 DIAGNOSIS — C250 Malignant neoplasm of head of pancreas: Secondary | ICD-10-CM

## 2015-09-07 NOTE — Telephone Encounter (Signed)
-----   Message from Ladell Pier, MD sent at 09/06/2015  2:30 PM EST ----- Please call patient, tumor marker at upper normal range, repeat 6 weeks with portacath flush

## 2015-09-07 NOTE — Telephone Encounter (Signed)
Pt returned call, informed her tumor marker is higher, at upper normal range. Will recheck in 6 weeks with port flush. Pt voiced understanding.

## 2015-09-13 ENCOUNTER — Encounter: Payer: Self-pay | Admitting: Gastroenterology

## 2015-09-13 ENCOUNTER — Ambulatory Visit (INDEPENDENT_AMBULATORY_CARE_PROVIDER_SITE_OTHER): Payer: Medicare Other | Admitting: Gastroenterology

## 2015-09-13 VITALS — BP 90/60 | HR 56 | Ht 62.5 in | Wt 162.0 lb

## 2015-09-13 DIAGNOSIS — C259 Malignant neoplasm of pancreas, unspecified: Secondary | ICD-10-CM
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
## 2015-09-13 NOTE — Patient Instructions (Signed)
Follow up as needed

## 2015-10-18 ENCOUNTER — Ambulatory Visit (HOSPITAL_BASED_OUTPATIENT_CLINIC_OR_DEPARTMENT_OTHER): Payer: Medicare Other

## 2015-10-18 ENCOUNTER — Other Ambulatory Visit (HOSPITAL_BASED_OUTPATIENT_CLINIC_OR_DEPARTMENT_OTHER): Payer: Medicare Other

## 2015-10-18 VITALS — BP 106/58 | HR 49 | Temp 98.0°F | Resp 18

## 2015-10-18 DIAGNOSIS — Z8507 Personal history of malignant neoplasm of pancreas: Secondary | ICD-10-CM | POA: Diagnosis not present

## 2015-10-18 DIAGNOSIS — C250 Malignant neoplasm of head of pancreas: Secondary | ICD-10-CM

## 2015-10-18 DIAGNOSIS — Z452 Encounter for adjustment and management of vascular access device: Secondary | ICD-10-CM | POA: Diagnosis not present

## 2015-10-18 DIAGNOSIS — Z95828 Presence of other vascular implants and grafts: Secondary | ICD-10-CM

## 2015-10-18 LAB — COMPREHENSIVE METABOLIC PANEL
ALBUMIN: 3.7 g/dL (ref 3.5–5.0)
ALT: 41 U/L (ref 0–55)
ANION GAP: 8 meq/L (ref 3–11)
AST: 47 U/L — ABNORMAL HIGH (ref 5–34)
Alkaline Phosphatase: 117 U/L (ref 40–150)
BUN: 9.3 mg/dL (ref 7.0–26.0)
CALCIUM: 8.7 mg/dL (ref 8.4–10.4)
CHLORIDE: 105 meq/L (ref 98–109)
CO2: 24 meq/L (ref 22–29)
CREATININE: 0.8 mg/dL (ref 0.6–1.1)
EGFR: 80 mL/min/{1.73_m2} — ABNORMAL LOW (ref >90)
Glucose: 139 mg/dl (ref 70–140)
Potassium: 3.8 mEq/L (ref 3.5–5.1)
Sodium: 138 mEq/L (ref 136–145)
TOTAL PROTEIN: 6.6 g/dL (ref 6.4–8.3)
Total Bilirubin: 0.67 mg/dL (ref 0.20–1.20)

## 2015-10-18 LAB — CBC WITH DIFFERENTIAL/PLATELET
BASO%: 1.1 % (ref 0.0–2.0)
BASOS ABS: 0 10*3/uL (ref 0.0–0.1)
EOS ABS: 0.2 10*3/uL (ref 0.0–0.5)
EOS%: 5 % (ref 0.0–7.0)
HEMATOCRIT: 32.8 % — AB (ref 34.8–46.6)
HEMOGLOBIN: 11 g/dL — AB (ref 11.6–15.9)
LYMPH%: 37.2 % (ref 14.0–49.7)
MCH: 29.7 pg (ref 25.1–34.0)
MCHC: 33.5 g/dL (ref 31.5–36.0)
MCV: 88.6 fL (ref 79.5–101.0)
MONO#: 0.3 10*3/uL (ref 0.1–0.9)
MONO%: 9.2 % (ref 0.0–14.0)
NEUT%: 47.5 % (ref 38.4–76.8)
NEUTROS ABS: 1.7 10*3/uL (ref 1.5–6.5)
PLATELETS: 186 10*3/uL (ref 145–400)
RBC: 3.7 10*6/uL (ref 3.70–5.45)
RDW: 13.4 % (ref 11.2–14.5)
WBC: 3.6 10*3/uL — AB (ref 3.9–10.3)
lymph#: 1.3 10*3/uL (ref 0.9–3.3)

## 2015-10-18 MED ORDER — SODIUM CHLORIDE 0.9% FLUSH
10.0000 mL | INTRAVENOUS | Status: DC | PRN
  Administered 2015-10-18: 10 mL via INTRAVENOUS
  Filled 2015-10-18: qty 10

## 2015-10-18 MED ORDER — HEPARIN SOD (PORK) LOCK FLUSH 100 UNIT/ML IV SOLN
500.0000 [IU] | Freq: Once | INTRAVENOUS | Status: AC
  Administered 2015-10-18: 500 [IU] via INTRAVENOUS
  Filled 2015-10-18: qty 5

## 2015-10-18 NOTE — Patient Instructions (Signed)

## 2015-10-19 LAB — CANCER ANTIGEN 19-9 (PARALLEL TESTING): CA 19 9: 45.6 U/mL — AB (ref <35.0)

## 2015-10-19 LAB — CANCER ANTIGEN 19-9: CAN 19-9: 42 U/mL — AB (ref 0–35)

## 2015-10-20 ENCOUNTER — Telehealth: Payer: Self-pay | Admitting: *Deleted

## 2015-10-20 NOTE — Telephone Encounter (Signed)
-----   Message from Ladell Pier, MD sent at 10/19/2015  9:13 PM EST ----- Please call patient, [id number] just above normal range- likely not cancer related, repeat next portacath flush and if higher consider CTs

## 2015-10-23 ENCOUNTER — Encounter: Payer: Self-pay | Admitting: Oncology

## 2015-10-24 ENCOUNTER — Encounter: Payer: Self-pay | Admitting: Oncology

## 2015-10-24 NOTE — Telephone Encounter (Signed)
Message from pt requesting lab result. Left message requesting her to call office.

## 2015-10-24 NOTE — Telephone Encounter (Signed)
Pt returned call, informed her CEA is just above normal range. Likely not cancer related, per Dr. [name]. Will repeat next visit and consider scan if higher. Pt voiced understanding.

## 2015-10-25 ENCOUNTER — Other Ambulatory Visit: Payer: Self-pay | Admitting: Cardiology

## 2015-11-08 ENCOUNTER — Other Ambulatory Visit (HOSPITAL_COMMUNITY)
Admission: RE | Admit: 2015-11-08 | Discharge: 2015-11-08 | Disposition: A | Discharge status: Home / Self Care | Payer: Medicare Other | Source: Ambulatory Visit | Attending: Gynecologic Oncology | Admitting: Gynecologic Oncology

## 2015-11-08 ENCOUNTER — Encounter: Payer: Self-pay | Admitting: Gynecologic Oncology

## 2015-11-08 ENCOUNTER — Ambulatory Visit: Payer: Medicare Other | Attending: Gynecologic Oncology | Admitting: Gynecologic Oncology

## 2015-11-08 VITALS — BP 126/74 | HR 50 | Temp 98.1°F | Resp 18 | Wt 169.2 lb

## 2015-11-08 DIAGNOSIS — K859 Acute pancreatitis without necrosis or infection, unspecified: Secondary | ICD-10-CM | POA: Insufficient documentation

## 2015-11-08 DIAGNOSIS — Z803 Family history of malignant neoplasm of breast: Secondary | ICD-10-CM | POA: Diagnosis not present

## 2015-11-08 DIAGNOSIS — Z8041 Family history of malignant neoplasm of ovary: Secondary | ICD-10-CM | POA: Diagnosis not present

## 2015-11-08 DIAGNOSIS — K5790 Diverticulosis of intestine, part unspecified, without perforation or abscess without bleeding: Secondary | ICD-10-CM | POA: Insufficient documentation

## 2015-11-08 DIAGNOSIS — R911 Solitary pulmonary nodule: Secondary | ICD-10-CM | POA: Insufficient documentation

## 2015-11-08 DIAGNOSIS — Z01411 Encounter for gynecological examination (general) (routine) with abnormal findings: Secondary | ICD-10-CM | POA: Diagnosis present

## 2015-11-08 DIAGNOSIS — C259 Malignant neoplasm of pancreas, unspecified: Secondary | ICD-10-CM | POA: Insufficient documentation

## 2015-11-08 DIAGNOSIS — C541 Malignant neoplasm of endometrium: Secondary | ICD-10-CM | POA: Insufficient documentation

## 2015-11-08 DIAGNOSIS — K219 Gastro-esophageal reflux disease without esophagitis: Secondary | ICD-10-CM | POA: Insufficient documentation

## 2015-11-08 DIAGNOSIS — B9681 Helicobacter pylori [H. pylori] as the cause of diseases classified elsewhere: Secondary | ICD-10-CM | POA: Diagnosis not present

## 2015-11-08 DIAGNOSIS — K559 Vascular disorder of intestine, unspecified: Secondary | ICD-10-CM | POA: Diagnosis not present

## 2015-11-08 DIAGNOSIS — I480 Paroxysmal atrial fibrillation: Secondary | ICD-10-CM | POA: Insufficient documentation

## 2015-11-08 DIAGNOSIS — K8020 Calculus of gallbladder without cholecystitis without obstruction: Secondary | ICD-10-CM | POA: Insufficient documentation

## 2015-11-08 DIAGNOSIS — Z8507 Personal history of malignant neoplasm of pancreas: Secondary | ICD-10-CM | POA: Diagnosis not present

## 2015-11-08 DIAGNOSIS — Z8542 Personal history of malignant neoplasm of other parts of uterus: Secondary | ICD-10-CM

## 2015-11-08 DIAGNOSIS — K648 Other hemorrhoids: Secondary | ICD-10-CM | POA: Insufficient documentation

## 2015-11-08 DIAGNOSIS — Z800 Family history of malignant neoplasm of digestive organs: Secondary | ICD-10-CM | POA: Diagnosis not present

## 2015-11-08 DIAGNOSIS — K2950 Unspecified chronic gastritis without bleeding: Secondary | ICD-10-CM | POA: Diagnosis not present

## 2015-11-08 DIAGNOSIS — E669 Obesity, unspecified: Secondary | ICD-10-CM | POA: Insufficient documentation

## 2015-11-08 DIAGNOSIS — Z90710 Acquired absence of both cervix and uterus: Secondary | ICD-10-CM | POA: Insufficient documentation

## 2015-11-08 DIAGNOSIS — I10 Essential (primary) hypertension: Secondary | ICD-10-CM | POA: Insufficient documentation

## 2015-11-08 DIAGNOSIS — Z9889 Other specified postprocedural states: Secondary | ICD-10-CM | POA: Insufficient documentation
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
## 2015-11-08 NOTE — Patient Instructions (Addendum)
Follow-up with her other physicians as scheduled. Please return to see Dr. Alycia Rossetti in 6 months. We will notify you of the results of your Pap smear from today. Please call our office after you see Dr. Sondra Come to schedule your appointment with Dr. Alycia Rossetti.

## 2015-11-09 ENCOUNTER — Other Ambulatory Visit: Payer: Self-pay

## 2015-11-09 ENCOUNTER — Encounter: Payer: Self-pay | Admitting: Internal Medicine

## 2015-11-09 ENCOUNTER — Other Ambulatory Visit: Payer: Self-pay | Admitting: Physician Assistant

## 2015-11-09 DIAGNOSIS — Z1231 Encounter for screening mammogram for malignant neoplasm of breast: Secondary | ICD-10-CM

## 2015-11-10 LAB — CYTOLOGY - PAP

## 2015-11-14 ENCOUNTER — Telehealth: Payer: Self-pay

## 2015-11-14 NOTE — Telephone Encounter (Signed)
Orders received from [facility] to contact the patient and update with PAP results being "normal" . Attempted to reach the patient on her land line and cell number, no answer , left a detailed message on both phones . Left GYN/ONC contact information if additional questions arise .

## 2015-11-28 ENCOUNTER — Other Ambulatory Visit: Payer: Self-pay | Admitting: Cardiology

## 2015-11-30 ENCOUNTER — Ambulatory Visit (HOSPITAL_BASED_OUTPATIENT_CLINIC_OR_DEPARTMENT_OTHER): Payer: Medicare Other | Admitting: Oncology

## 2015-11-30 ENCOUNTER — Other Ambulatory Visit (HOSPITAL_BASED_OUTPATIENT_CLINIC_OR_DEPARTMENT_OTHER): Payer: Medicare Other

## 2015-11-30 ENCOUNTER — Telehealth: Payer: Self-pay | Admitting: Oncology

## 2015-11-30 ENCOUNTER — Ambulatory Visit (HOSPITAL_BASED_OUTPATIENT_CLINIC_OR_DEPARTMENT_OTHER): Payer: Medicare Other

## 2015-11-30 VITALS — BP 133/74 | HR 51 | Temp 97.6°F | Resp 18 | Ht 62.5 in | Wt 172.3 lb

## 2015-11-30 DIAGNOSIS — C250 Malignant neoplasm of head of pancreas: Secondary | ICD-10-CM

## 2015-11-30 DIAGNOSIS — Z452 Encounter for adjustment and management of vascular access device: Secondary | ICD-10-CM

## 2015-11-30 DIAGNOSIS — Z95828 Presence of other vascular implants and grafts: Secondary | ICD-10-CM

## 2015-11-30 DIAGNOSIS — Z8589 Personal history of malignant neoplasm of other organs and systems: Secondary | ICD-10-CM

## 2015-11-30 DIAGNOSIS — Z8507 Personal history of malignant neoplasm of pancreas: Secondary | ICD-10-CM

## 2015-11-30 LAB — COMPREHENSIVE METABOLIC PANEL
ALT: 31 U/L (ref 0–55)
AST: 40 U/L — ABNORMAL HIGH (ref 5–34)
Albumin: 3.7 g/dL (ref 3.5–5.0)
Alkaline Phosphatase: 121 U/L (ref 40–150)
Anion Gap: 7 mEq/L (ref 3–11)
BUN: 9.3 mg/dL (ref 7.0–26.0)
CHLORIDE: 107 meq/L (ref 98–109)
CO2: 24 meq/L (ref 22–29)
Calcium: 8.8 mg/dL (ref 8.4–10.4)
Creatinine: 0.7 mg/dL (ref 0.6–1.1)
EGFR: 84 mL/min/{1.73_m2} — AB (ref >90)
GLUCOSE: 118 mg/dL (ref 70–140)
POTASSIUM: 3.7 meq/L (ref 3.5–5.1)
SODIUM: 138 meq/L (ref 136–145)
TOTAL PROTEIN: 6.9 g/dL (ref 6.4–8.3)
Total Bilirubin: 0.62 mg/dL (ref 0.20–1.20)

## 2015-11-30 MED ORDER — SODIUM CHLORIDE 0.9% FLUSH
10.0000 mL | INTRAVENOUS | Status: DC | PRN
  Administered 2015-11-30: 10 mL via INTRAVENOUS
  Filled 2015-11-30: qty 10

## 2015-11-30 MED ORDER — HEPARIN SOD (PORK) LOCK FLUSH 100 UNIT/ML IV SOLN
500.0000 [IU] | Freq: Once | INTRAVENOUS | Status: AC
Start: 2015-11-30 — End: 2015-11-30
  Administered 2015-11-30: 500 [IU] via INTRAVENOUS
  Filled 2015-11-30: qty 5

## 2015-11-30 NOTE — Telephone Encounter (Signed)
per pof to sch pt appt-gave pt copy of avs °

## 2015-11-30 NOTE — Patient Instructions (Signed)

## 2015-11-30 NOTE — Progress Notes (Signed)
[facility] OFFICE PROGRESS NOTE   Diagnosis: Pancreas cancer, uterine cancer  INTERVAL HISTORY:   [name] returns as scheduled. She saw Dr. Alycia Rossetti last month and remains in clinical remission from uterine cancer. Good appetite and energy level. She was treated with ciprofloxacin for an upper respiratory infection approximately 2 weeks ago. She discontinued Creon 3 weeks ago. She reports diarrhea for the past 2 weeks. She has up to 7 loose stools per day. The abdomen is "sore "for the past few weeks.  Objective:  Vital signs in last 24 hours:  Blood pressure 133/74, pulse 51, temperature 97.6 F (36.4 C), temperature source Oral, resp. rate 18, height 5' 2.5" (1.588 m), weight 172 lb 4.8 oz (78.155 kg), SpO2 100 %.    HEENT: Neck without mass Lymphatics: No cervical, supraclavicular, axillary, or inguinal nodes Resp: Lungs clear bilaterally Cardio: Regular rate and rhythm GI: No hepatosplenomegaly, no mass, no apparent ascites, mild diffuse tenderness Vascular: No leg edema   Portacath/PICC-without erythema  Lab Results:  Lab Results  Component Value Date   WBC 3.6* 10/18/2015   HGB 11.0* 10/18/2015   HCT 32.8* 10/18/2015   MCV 88.6 10/18/2015   PLT 186 10/18/2015   NEUTROABS 1.7 10/18/2015   CA 19-9 on 10/18/2015-45.6  Medications: I have reviewed the patient's current medications.  Assessment/Plan: 1. 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  Gemcitabine held 11/02/2014 due to neutropenia.  Gemcitabine 11/09/2014 dose reduced 800 mg/m.  Gemcitabine held 11/16/2014 due to neutropenia.  Gemcitabine resumed  11/23/2014 every 2 week schedule.  Cycle 6 gemcitabine 01/05/2015  Cycle 7 gemcitabine 01/18/2015  Cycle 8 gemcitabine 02/01/2015  Cycle 9 gemcitabine 02/15/2015  Cycle 10 gemcitabine 03/01/2015  Cycle 11 gemcitabine 03/15/2015  Cycle 12 gemcitabine 03/29/2015  2. 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 Admission with post ERCP pancreatitis 06/16/2014  4. History of abdominal pain secondary to #1  5. Pulmonary embolism diagnosed on a CT of the abdomen 09/16/2014  Negative lower extremity Dopplers 09/17/2014  6. Multiple orthopedic surgical procedures  7. Endometrial cancer,stage IA, grade 1 endometrioid adenocarcinoma, 18% myometrial invasion, no lymphovascular space involvement, negative washings  Status post robotic total hysterectomy and bilateral salpingo-oophorectomy 11/30/2010  Recurrent tumor left lateral vagina status post biopsy 11/24/2014 with pathology confirming adenocarcinoma with focal squamous differentiation consistent with endometrial adenocarcinoma  Staging CT scans 12/06/2014 with no evidence of local pancreatic cancer recurrence. Small fluid collection adjacent to the left adrenal gland. Severe hepatic steatosis. No evidence of local extension of endometrial carcinoma. Carcinoma not well-defined at the vaginal cuff. 5 mm right external iliac lymph node. 3.6 mm left external iliac lymph node  Brachytherapy initiated 12/22/2014, completed 01/19/2015  CT abdomen/pelvis 07/24/2015 revealed a 3 x 4 cm soft tissue focus at the vaginal apex  PET scan 08/11/2015 revealed no mass at the vaginal apex and no evidence of metastatic disease  8. History of atrial fibrillation-maintained on xarelto  9. Family history of multiple cancers-negative CancerNext gene panel  10. Prolonged nausea following the pancreaticoduodenectomy. Improved 10/26/2014.  11. Port-A-Cath placement 10/21/2014.  12.  History of Neutropenia secondary to chemotherapy   13. Diarrhea. Question pancreatic insufficiency. Pancreatic enzyme replacement  initiated 01/05/2015. Recurrent diarrhea following a course of antibiotics March 2017.  14. History of positional vertigo-resolved    Disposition:  She remains in clinical remission from endometrial cancer and pancreas cancer. We will follow-up on the CA 19-9 from today. I encouraged her to follow-up with her primary physician today to evaluate the diarrhea. She may have developed C. difficile colitis. I encouraged her to resume pancreatic enzyme replacement.  [name] will return for a Port-A-Cath flush in 6 weeks. She will be scheduled for an office visit in 12 weeks.  Betsy Coder, MD  11/30/2015  10:10 AM

## 2015-12-01 LAB — CANCER ANTIGEN 19-9: CA 19-9: 43 U/mL — ABNORMAL HIGH (ref 0–35)

## 2015-12-01 LAB — CANCER ANTIGEN 19-9 (PARALLEL TESTING): CA 19-9: 64 U/mL — ABNORMAL HIGH (ref <34)

## 2015-12-05 ENCOUNTER — Encounter: Payer: Self-pay | Admitting: Oncology

## 2016-01-11 ENCOUNTER — Ambulatory Visit (HOSPITAL_BASED_OUTPATIENT_CLINIC_OR_DEPARTMENT_OTHER): Payer: Medicare Other

## 2016-01-11 ENCOUNTER — Other Ambulatory Visit: Payer: Self-pay

## 2016-01-11 DIAGNOSIS — Z8507 Personal history of malignant neoplasm of pancreas: Secondary | ICD-10-CM

## 2016-01-11 DIAGNOSIS — Z8589 Personal history of malignant neoplasm of other organs and systems: Secondary | ICD-10-CM | POA: Diagnosis not present

## 2016-01-11 DIAGNOSIS — Z452 Encounter for adjustment and management of vascular access device: Secondary | ICD-10-CM

## 2016-01-11 DIAGNOSIS — Z95828 Presence of other vascular implants and grafts: Secondary | ICD-10-CM | POA: Insufficient documentation

## 2016-01-11 DIAGNOSIS — C541 Malignant neoplasm of endometrium: Secondary | ICD-10-CM

## 2016-01-11 MED ORDER — HEPARIN SOD (PORK) LOCK FLUSH 100 UNIT/ML IV SOLN
500.0000 [IU] | Freq: Once | INTRAVENOUS | Status: AC | PRN
  Administered 2016-01-11: 500 [IU] via INTRAVENOUS
  Filled 2016-01-11: qty 5

## 2016-01-11 MED ORDER — SODIUM CHLORIDE 0.9 % IJ SOLN
10.0000 mL | INTRAMUSCULAR | Status: DC | PRN
  Administered 2016-01-11: 10 mL via INTRAVENOUS
  Filled 2016-01-11: qty 10

## 2016-01-11 NOTE — Patient Instructions (Signed)

## 2016-01-20 ENCOUNTER — Other Ambulatory Visit: Payer: Self-pay | Admitting: Cardiology

## 2016-02-07 ENCOUNTER — Encounter: Payer: Self-pay | Admitting: Radiation Oncology

## 2016-02-07 ENCOUNTER — Ambulatory Visit
Admission: RE | Admit: 2016-02-07 | Discharge: 2016-02-07 | Disposition: A | Discharge status: Home / Self Care | Payer: Medicare Other | Source: Ambulatory Visit | Attending: Radiation Oncology | Admitting: Radiation Oncology

## 2016-02-07 VITALS — BP 109/60 | HR 49 | Temp 98.2°F | Ht 62.5 in | Wt 175.0 lb

## 2016-02-07 DIAGNOSIS — C250 Malignant neoplasm of head of pancreas: Secondary | ICD-10-CM

## 2016-02-07 DIAGNOSIS — C541 Malignant neoplasm of endometrium: Secondary | ICD-10-CM

## 2016-02-07 NOTE — Progress Notes (Addendum)
[name] here for follow up.  She reports having upper back pain that comes and goes.  She also reports weakness in her lower legs that comes and goes.  She said it started 3 weeks ago.  She denies having any bladder issues, bowel issues, vaginal/rectal bleeding or discharge.  She reports she has not used her vaginal dilator.  She reports having a good energy level.  BP 109/60 mmHg  Pulse 49  Temp(Src) 98.2 F (36.8 C) (Oral)  Ht 5' 2.5" (1.588 m)  Wt 175 lb (79.379 kg)  BMI 31.48 kg/m2  SpO2 99%   Wt Readings from Last 3 Encounters:  02/07/16 175 lb (79.379 kg)  11/30/15 172 lb 4.8 oz (78.155 kg)  11/08/15 169 lb 3.2 oz (76.749 kg)

## 2016-02-07 NOTE — Progress Notes (Signed)
Radiation Oncology         [phone] ________________________________  Name: Savannah ULRICK MRN: [id number]  Date: 02/07/2016  DOB: [birth year]    Follow-Up Visit Note  CC: Chesley Noon, MD  Nancy Marus, MD    ICD-9-CM ICD-10-CM   1. Endometrial ca (HCC) 182.0 C54.1   2. Adenocarcinoma of head of pancreas ([place]) 157.0 C25.0     Diagnosis:   Recurrent endometrial cancer  Interval Since Last Radiation:  12.5 months ; (12/22/2014, 12/29/2014, 01/05/2015, 01/12/2015, 01/19/2015)  Narrative:  [name] here for follow up on vaginal brachytherapy for recurrent endometrial cancer. She reports having upper back pain that comes and goes. She also reports weakness in her lower legs that comes and goes. She said it started 3 weeks ago. She denies having any bladder issues, bowel issues, vaginal/rectal bleeding or discharge. She reports she has not used her vaginal dilator. She reports having a good energy level. She has been gardening and doing yard work regularly. She previously had diarrhea, attributing this to not having Creon at that time. She has since received another prescription of Creon and the diarrhea has resolved. She will see Dr. [name] again on June 1st. She does not currently have a follow up appointment scheduled with Dr. Alycia Rossetti.    ALLERGIES:  is allergic to ace inhibitors; codeine; scopolamine; and sulfa antibiotics.  Meds: Current Outpatient Prescriptions  Medication Sig Dispense Refill  . acetaminophen (TYLENOL) 500 MG tablet Take 1,000 mg by mouth every 6 (six) hours as needed for pain.     Marland Kitchen b complex vitamins tablet Take 1 tablet by mouth daily.    . Eszopiclone 3 MG TABS Take 1.5 mg by mouth at bedtime.     . flecainide (TAMBOCOR) 100 MG tablet TAKE 1 TABLET BY MOUTH TWICE A DAY 30 tablet 0  . fluticasone (FLONASE) 50 MCG/ACT nasal spray Place 1 spray into both nostrils 2 (two) times daily as needed.     Marland Kitchen HYDROcodone-acetaminophen (NORCO/VICODIN) 5-325  MG tablet Take 1 tablet by mouth every 6 (six) hours as needed for moderate pain.    Marland Kitchen lidocaine-prilocaine (EMLA) cream Apply small amount over port area 1-2 hours prior to treatment and cover with plastic wrap.  DO NOT RUB IN. 30 g prn  . lipase/protease/amylase (CREON) 12000 UNITS CPEP capsule Take 2 capsules (24,000 Units total) by mouth 3 (three) times daily before meals. 180 capsule 2  . LORazepam (ATIVAN) 1 MG tablet Take 0.5 tablets (0.5 mg total) by mouth every 8 (eight) hours as needed for anxiety. 45 tablet 0  . losartan (COZAAR) 50 MG tablet TAKE 1 TABLET BY MOUTH EVERY DAY 30 tablet 11  . metoprolol succinate (TOPROL-XL) 25 MG 24 hr tablet Take 1/2 tablet by mouth daily    . Multiple Vitamins-Minerals (CENTRUM SILVER PO) Take 1 tablet by mouth every morning.     . vitamin C (ASCORBIC ACID) 500 MG tablet Take 500 mg by mouth every morning.     [name] 20 MG TABS tablet TAKE 1 TABLET (20 MG TOTAL) BY MOUTH DAILY. 90 tablet 1  . meclizine (ANTIVERT) 32 MG tablet Take 1 tablet (32 mg total) by mouth 3 (three) times daily as needed. (Patient not taking: Reported on 11/08/2015) 30 tablet 0  . omeprazole (PRILOSEC) 20 MG capsule Take 2 capsules (40 mg total) by mouth 2 (two) times daily before a meal. (Patient not taking: Reported on 02/07/2016) 60 capsule 6  . ondansetron (ZOFRAN) 4 MG tablet  Take 1 tablet (4 mg total) by mouth every 6 (six) hours as needed for nausea or vomiting. (Patient not taking: Reported on 02/07/2016) 30 tablet 3  . promethazine (PHENERGAN) 12.5 MG tablet Take 12.5 mg by mouth as needed for nausea. Reported on 02/07/2016     No current facility-administered medications for this encounter.   Facility-Administered Medications Ordered in Other Encounters  Medication Dose Route Frequency Provider Last Rate Last Dose  . sodium chloride 0.9 % bolus 1,000 mL  1,000 mL Intravenous Once Coralie Keens, MD        Physical Findings: The patient is in no acute distress.  Patient is alert and oriented.  height is 5' 2.5" (1.588 m) and weight is 175 lb (79.379 kg). Her oral temperature is 98.2 F (36.8 C). Her blood pressure is 109/60 and her pulse is 49. Her oxygen saturation is 99%. . No palpable subclavicular or axillary adenopathy. The lungs are clear to auscultation. The heart has a regular rhythm and rate. The abdomen is soft and nontender with normal bowel sounds. No inguinal adenopathy is appreciated. On pelvic examination the external genitalia are unremarkable. A speculum exam is performed. There are no mucosal lesions noted in the vaginal vault.  On bimanual and rectovaginal examination there no pelvic masses appreciated. Rectal sphincter tone is good.  Lab Findings: Lab Results  Component Value Date   WBC 3.6* 10/18/2015   HGB 11.0* 10/18/2015   HCT 32.8* 10/18/2015   MCV 88.6 10/18/2015   PLT 186 10/18/2015    Radiographic Findings: No results found.  Impression: No evidence of recurrence on clinical exam.  Plan: Follow up with Dr. Alycia Rossetti in 3 months. Follow up with radiation oncology in 6 months. Patient will continue follow-up for her pancreatic cancer with Dr. Barry Dienes and Dr. [name].  -----------------------------------  Blair Promise, PhD, MD  This document serves as a record of services personally performed by Gery Pray, MD. It was created on his behalf by Arlyce Harman, a trained medical scribe. The creation of this record is based on the scribe's personal observations and the provider's statements to them. This document has been checked and approved by the attending provider.

## 2016-02-08 ENCOUNTER — Other Ambulatory Visit: Payer: Self-pay | Admitting: Cardiology

## 2016-02-09 ENCOUNTER — Other Ambulatory Visit: Payer: Self-pay | Admitting: Cardiology

## 2016-02-09 MED ORDER — FLECAINIDE ACETATE 100 MG PO TABS: 100.0000 mg | ORAL_TABLET | Freq: Two times a day (BID) | ORAL | Status: DC

## 2016-02-13 ENCOUNTER — Ambulatory Visit
Admission: RE | Admit: 2016-02-13 | Discharge: 2016-02-13 | Disposition: A | Discharge status: Home / Self Care | Payer: Medicare Other | Source: Ambulatory Visit

## 2016-02-13 DIAGNOSIS — Z1231 Encounter for screening mammogram for malignant neoplasm of breast: Secondary | ICD-10-CM

## 2016-02-20 ENCOUNTER — Telehealth: Payer: Self-pay | Admitting: Physician Assistant

## 2016-02-20 ENCOUNTER — Ambulatory Visit (INDEPENDENT_AMBULATORY_CARE_PROVIDER_SITE_OTHER): Payer: Medicare Other | Admitting: Physician Assistant

## 2016-02-20 ENCOUNTER — Encounter: Payer: Self-pay | Admitting: Physician Assistant

## 2016-02-20 VITALS — BP 106/70 | HR 48 | Ht 63.0 in | Wt 172.8 lb

## 2016-02-20 DIAGNOSIS — I10 Essential (primary) hypertension: Secondary | ICD-10-CM

## 2016-02-20 DIAGNOSIS — R001 Bradycardia, unspecified: Secondary | ICD-10-CM | POA: Diagnosis not present

## 2016-02-20 DIAGNOSIS — Z86711 Personal history of pulmonary embolism: Secondary | ICD-10-CM

## 2016-02-20 DIAGNOSIS — I480 Paroxysmal atrial fibrillation: Secondary | ICD-10-CM | POA: Diagnosis not present

## 2016-02-20 NOTE — Patient Instructions (Addendum)
Medication Instructions:  Your physician recommends that you continue on your current medications as directed. Please refer to the Current Medication list given to you today.   Labwork: None ordered  Testing/Procedures: None ordered  Follow-Up: Your physician wants you to follow-up in: 4-6 MONTHS WITH DR. Aundra Dubin   You will receive a reminder letter in the mail two months in advance. If you don't receive a letter, please call our office to schedule the follow-up appointment.       Any Other Special Instructions Will Be Listed Below (If Applicable).     If you need a refill on your cardiac medications before your next appointment, please call your pharmacy.

## 2016-02-20 NOTE — Telephone Encounter (Signed)
entered in error   
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
## 2016-02-20 NOTE — Telephone Encounter (Signed)
New Message  Pt stated- is returning Katie's phone call. Please call back and discuss.

## 2016-02-21 NOTE — Telephone Encounter (Signed)
Called pt, per Nell Range, PA-C, to let her know that she discussed increasing Flecainide with Dr. Aundra Dubin, he stated "Would ask again if she wants to consider atrial fibrillation ablation. If she is having breakthrough on flecainide 100 bid and does not want ablation, think she could stop flecainide and use Tikosyn instead. Would have to have 3 day Tikosyn admission arranged through pharmacy clinic."  Pt advised me that she will have to think about this and call us back.  She was advised that if she needed any further information to help her decide, to just call us and we will answer any questions that she may have. PT verbalized understanding.

## 2016-02-22 ENCOUNTER — Ambulatory Visit (HOSPITAL_BASED_OUTPATIENT_CLINIC_OR_DEPARTMENT_OTHER): Payer: Medicare Other | Admitting: Oncology

## 2016-02-22 ENCOUNTER — Ambulatory Visit (HOSPITAL_BASED_OUTPATIENT_CLINIC_OR_DEPARTMENT_OTHER): Payer: Medicare Other

## 2016-02-22 ENCOUNTER — Other Ambulatory Visit (HOSPITAL_BASED_OUTPATIENT_CLINIC_OR_DEPARTMENT_OTHER): Payer: Medicare Other

## 2016-02-22 ENCOUNTER — Other Ambulatory Visit: Payer: Self-pay | Admitting: *Deleted

## 2016-02-22 ENCOUNTER — Telehealth: Payer: Self-pay | Admitting: Oncology

## 2016-02-22 VITALS — BP 110/61 | HR 44 | Temp 98.2°F | Resp 18 | Ht 63.0 in | Wt 170.8 lb

## 2016-02-22 DIAGNOSIS — Z8507 Personal history of malignant neoplasm of pancreas: Secondary | ICD-10-CM

## 2016-02-22 DIAGNOSIS — C250 Malignant neoplasm of head of pancreas: Secondary | ICD-10-CM

## 2016-02-22 DIAGNOSIS — C541 Malignant neoplasm of endometrium: Secondary | ICD-10-CM

## 2016-02-22 DIAGNOSIS — Z8542 Personal history of malignant neoplasm of other parts of uterus: Secondary | ICD-10-CM

## 2016-02-22 DIAGNOSIS — Z95828 Presence of other vascular implants and grafts: Secondary | ICD-10-CM

## 2016-02-22 DIAGNOSIS — Z86711 Personal history of pulmonary embolism: Secondary | ICD-10-CM | POA: Diagnosis not present

## 2016-02-22 DIAGNOSIS — Z452 Encounter for adjustment and management of vascular access device: Secondary | ICD-10-CM | POA: Diagnosis not present

## 2016-02-22 LAB — COMPREHENSIVE METABOLIC PANEL
ALK PHOS: 133 U/L (ref 40–150)
ALT: 30 U/L (ref 0–55)
AST: 36 U/L — AB (ref 5–34)
Albumin: 3.7 g/dL (ref 3.5–5.0)
Anion Gap: 6 mEq/L (ref 3–11)
BUN: 15.1 mg/dL (ref 7.0–26.0)
CHLORIDE: 106 meq/L (ref 98–109)
CO2: 25 meq/L (ref 22–29)
Calcium: 8.9 mg/dL (ref 8.4–10.4)
Creatinine: 0.7 mg/dL (ref 0.6–1.1)
EGFR: 84 mL/min/{1.73_m2} — AB (ref >90)
GLUCOSE: 113 mg/dL (ref 70–140)
POTASSIUM: 3.8 meq/L (ref 3.5–5.1)
SODIUM: 136 meq/L (ref 136–145)
Total Bilirubin: 0.74 mg/dL (ref 0.20–1.20)
Total Protein: 7 g/dL (ref 6.4–8.3)

## 2016-02-22 MED ORDER — METOPROLOL SUCCINATE ER 25 MG PO TB24: 12.5000 mg | ORAL_TABLET | Freq: Every day | ORAL | Status: DC

## 2016-02-22 MED ORDER — SODIUM CHLORIDE 0.9 % IJ SOLN
10.0000 mL | INTRAMUSCULAR | Status: DC | PRN
  Administered 2016-02-22: 10 mL via INTRAVENOUS
  Filled 2016-02-22: qty 10

## 2016-02-22 MED ORDER — HEPARIN SOD (PORK) LOCK FLUSH 100 UNIT/ML IV SOLN
500.0000 [IU] | Freq: Once | INTRAVENOUS | Status: AC | PRN
  Administered 2016-02-22: 500 [IU] via INTRAVENOUS
  Filled 2016-02-22: qty 5

## 2016-02-22 MED ORDER — HYDROCODONE-ACETAMINOPHEN 5-325 MG PO TABS: 1.0000 | ORAL_TABLET | Freq: Four times a day (QID) | ORAL | Status: DC | PRN

## 2016-02-22 NOTE — Progress Notes (Signed)
[place] OFFICE PROGRESS NOTE   Diagnosis: Pancreas cancer, uterine cancer  INTERVAL HISTORY:   Ms. Ricchio returns as scheduled. She reports increased lower back pain for the past several months. The pain radiates toward the right side. She has a history of chronic back pain. She takes hydrocodone once daily. The pain is worse with activity and better with sitting. No diarrhea. Good appetite.  Objective:  Vital signs in last 24 hours:  Blood pressure 110/61, pulse 44, temperature 98.2 F (36.8 C), temperature source Oral, resp. rate 18, height 5\' 3"  (1.6 m), weight 170 lb 12.8 oz (77.474 kg), SpO2 100 %.    HEENT: Neck without mass Lymphatics: No cervical, supra-clavicular, axillary, or inguinal nodes Resp: Lungs clear bilaterally Cardio: Irregular GI: No hepatomegaly, no mass, nontender, no apparent ascites Vascular: No leg edema Neuro: The leg and foot strength is intact bilaterally  Musculoskeletal: Mild tenderness at the lumbar spine. No mass.   Portacath/PICC-without erythema  Lab Results:  CA 19-9 on 11/30/2015  Medications: I have reviewed the patient's current medications.  Assessment/Plan: 1. 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  Gemcitabine held 11/02/2014 due to neutropenia.  Gemcitabine 11/09/2014 dose reduced 800 mg/m.  Gemcitabine held 11/16/2014 due to neutropenia.  Gemcitabine resumed 11/23/2014 every 2 week schedule.  Cycle 6 gemcitabine 01/05/2015  Cycle 7 gemcitabine 01/18/2015  Cycle 8 gemcitabine 02/01/2015  Cycle 9 gemcitabine 02/15/2015  Cycle 10 gemcitabine 03/01/2015  Cycle  11 gemcitabine 03/15/2015  Cycle 12 gemcitabine 03/29/2015  2. 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 Admission with post ERCP pancreatitis 06/16/2014  4. History of abdominal pain secondary to #1  5. Pulmonary embolism diagnosed on a CT of the abdomen 09/16/2014  Negative lower extremity Dopplers 09/17/2014  6. Multiple orthopedic surgical procedures  7. Endometrial cancer,stage IA, grade 1 endometrioid adenocarcinoma, 18% myometrial invasion, no lymphovascular space involvement, negative washings  Status post robotic total hysterectomy and bilateral salpingo-oophorectomy 11/30/2010  Recurrent tumor left lateral vagina status post biopsy 11/24/2014 with pathology confirming adenocarcinoma with focal squamous differentiation consistent with endometrial adenocarcinoma  Staging CT scans 12/06/2014 with no evidence of local pancreatic cancer recurrence. Small fluid collection adjacent to the left adrenal gland. Severe hepatic steatosis. No evidence of local extension of endometrial carcinoma. Carcinoma not well-defined at the vaginal cuff. 5 mm right external iliac lymph node. 3.6 mm left external iliac lymph node  Brachytherapy initiated 12/22/2014, completed 01/19/2015  CT abdomen/pelvis 07/24/2015 revealed a 3 x 4 cm soft tissue focus at the vaginal apex  PET scan 08/11/2015 revealed no mass at the vaginal apex and no evidence of metastatic disease  8. History of atrial fibrillation-maintained on xarelto  9. Family history of multiple cancers-negative CancerNext gene panel  10. Prolonged nausea following the pancreaticoduodenectomy. Improved 10/26/2014.  11. Port-A-Cath placement 10/21/2014.  12. History of Neutropenia secondary to chemotherapy   13. Diarrhea. Question pancreatic insufficiency. Pancreatic enzyme replacement initiated 01/05/2015. Recurrent diarrhea following a course of antibiotics March  2017.  14. History of positional vertigo-resolved   Disposition:  [name] remains in clinical remission from pancreas cancer and uterine cancer. We will follow-up on the CA 19-9 from today. She will  return for an office visit and CA 19-9 in 3 months. The Port-A-Cath remains in place and is flushed every 6 weeks.  She is scheduled to see Dr. Wynelle Link next week. She will discuss the back pain with him. I suspect the back pain is related to a benign musculoskeletal condition. We will arrange for imaging studies if the pain progresses.  Betsy Coder, MD  02/22/2016  12:39 PM

## 2016-02-22 NOTE — Telephone Encounter (Signed)
Gave and printed appt sched and avs for pt for July and Aug °

## 2016-02-22 NOTE — Telephone Encounter (Signed)
Gave and pritned appt sched and avs for pt for July and Aug °

## 2016-02-22 NOTE — Patient Instructions (Signed)

## 2016-02-23 ENCOUNTER — Telehealth: Payer: Self-pay | Admitting: Oncology

## 2016-02-23 ENCOUNTER — Telehealth: Payer: Self-pay | Admitting: *Deleted

## 2016-02-23 DIAGNOSIS — C250 Malignant neoplasm of head of pancreas: Secondary | ICD-10-CM

## 2016-02-23 LAB — CANCER ANTIGEN 19-9: CAN 19-9: 60 U/mL — AB (ref 0–35)

## 2016-02-23 NOTE — Telephone Encounter (Signed)
-----   Message from Ladell Pier, MD sent at 02/23/2016  3:22 PM EDT ----- Please call patient, ca19-9 mildly elevated, repeat with 6 week port flush, likely benign finding, still lower than when she had cancer, will schedule CTs if higher next time

## 2016-02-23 NOTE — Telephone Encounter (Signed)
Notified pt of elevated CA19-9- Likely benign, per Dr. [name]. Will repeat with port flush in July. Per MD, will schedule CTs if higher next time. Pt voiced understanding.

## 2016-02-23 NOTE — Telephone Encounter (Signed)
cld & spoke to pt and gave pt time & date of appt for 7/13@9 :15

## 2016-04-04 ENCOUNTER — Ambulatory Visit (HOSPITAL_BASED_OUTPATIENT_CLINIC_OR_DEPARTMENT_OTHER): Payer: Medicare Other

## 2016-04-04 ENCOUNTER — Other Ambulatory Visit: Payer: Medicare Other

## 2016-04-04 ENCOUNTER — Other Ambulatory Visit: Payer: Self-pay | Admitting: Physician Assistant

## 2016-04-04 VITALS — BP 110/57 | HR 49 | Temp 97.7°F | Resp 16

## 2016-04-04 DIAGNOSIS — R5381 Other malaise: Secondary | ICD-10-CM

## 2016-04-04 DIAGNOSIS — C250 Malignant neoplasm of head of pancreas: Secondary | ICD-10-CM

## 2016-04-04 DIAGNOSIS — Z452 Encounter for adjustment and management of vascular access device: Secondary | ICD-10-CM | POA: Diagnosis not present

## 2016-04-04 DIAGNOSIS — Z8542 Personal history of malignant neoplasm of other parts of uterus: Secondary | ICD-10-CM

## 2016-04-04 DIAGNOSIS — C541 Malignant neoplasm of endometrium: Secondary | ICD-10-CM

## 2016-04-04 DIAGNOSIS — Z95828 Presence of other vascular implants and grafts: Secondary | ICD-10-CM

## 2016-04-04 MED ORDER — SODIUM CHLORIDE 0.9 % IJ SOLN
10.0000 mL | INTRAMUSCULAR | Status: DC | PRN
  Administered 2016-04-04: 10 mL via INTRAVENOUS
  Filled 2016-04-04: qty 10

## 2016-04-04 MED ORDER — HEPARIN SOD (PORK) LOCK FLUSH 100 UNIT/ML IV SOLN
500.0000 [IU] | Freq: Once | INTRAVENOUS | Status: AC | PRN
  Administered 2016-04-04: 500 [IU] via INTRAVENOUS
  Filled 2016-04-04: qty 5

## 2016-04-04 NOTE — Patient Instructions (Signed)

## 2016-04-05 ENCOUNTER — Telehealth: Payer: Self-pay | Admitting: *Deleted

## 2016-04-05 ENCOUNTER — Other Ambulatory Visit: Payer: Self-pay | Admitting: Physician Assistant

## 2016-04-05 DIAGNOSIS — E2839 Other primary ovarian failure: Secondary | ICD-10-CM

## 2016-04-05 LAB — CANCER ANTIGEN 19-9: CA 19-9: 86 U/mL — ABNORMAL HIGH (ref 0–35)

## 2016-04-05 NOTE — Telephone Encounter (Signed)
-----   Message from Ladell Pier, MD sent at 04/05/2016  9:49 AM EDT ----- Please call patient, ca19-9 is slightly higher, but still only mildly elevated, if higher next month we will schedule CT

## 2016-04-05 NOTE — Telephone Encounter (Signed)
Per Dr. [name], pt notified that ca 19-9 is slightly higher, but still only mildly elevated, if higher next month we will schedule CT.  Pt appreciative of call and has no questions at this time.

## 2016-04-11 ENCOUNTER — Ambulatory Visit
Admission: RE | Admit: 2016-04-11 | Discharge: 2016-04-11 | Disposition: A | Discharge status: Home / Self Care | Payer: Medicare Other | Source: Ambulatory Visit | Attending: Physician Assistant | Admitting: Physician Assistant

## 2016-04-11 DIAGNOSIS — E2839 Other primary ovarian failure: Secondary | ICD-10-CM

## 2016-05-11 ENCOUNTER — Other Ambulatory Visit: Payer: Self-pay | Admitting: Cardiology

## 2016-05-15 ENCOUNTER — Other Ambulatory Visit (HOSPITAL_COMMUNITY)
Admission: RE | Admit: 2016-05-15 | Discharge: 2016-05-15 | Disposition: A | Discharge status: Home / Self Care | Payer: Medicare Other | Source: Ambulatory Visit | Attending: Gynecologic Oncology | Admitting: Gynecologic Oncology

## 2016-05-15 ENCOUNTER — Other Ambulatory Visit: Payer: Self-pay | Admitting: Gynecologic Oncology

## 2016-05-15 ENCOUNTER — Ambulatory Visit (HOSPITAL_BASED_OUTPATIENT_CLINIC_OR_DEPARTMENT_OTHER): Payer: Medicare Other

## 2016-05-15 ENCOUNTER — Ambulatory Visit: Payer: Medicare Other | Attending: Gynecologic Oncology | Admitting: Gynecologic Oncology

## 2016-05-15 ENCOUNTER — Other Ambulatory Visit: Payer: Medicare Other

## 2016-05-15 ENCOUNTER — Ambulatory Visit (HOSPITAL_BASED_OUTPATIENT_CLINIC_OR_DEPARTMENT_OTHER): Payer: Medicare Other | Admitting: Oncology

## 2016-05-15 ENCOUNTER — Encounter: Payer: Self-pay | Admitting: Gynecologic Oncology

## 2016-05-15 ENCOUNTER — Telehealth: Payer: Self-pay | Admitting: Oncology

## 2016-05-15 VITALS — BP 106/49 | HR 46 | Temp 98.0°F | Resp 17 | Ht 63.0 in | Wt 176.5 lb

## 2016-05-15 VITALS — BP 115/69 | HR 43 | Temp 98.0°F | Resp 16 | Wt 176.0 lb

## 2016-05-15 DIAGNOSIS — E669 Obesity, unspecified: Secondary | ICD-10-CM | POA: Diagnosis not present

## 2016-05-15 DIAGNOSIS — C541 Malignant neoplasm of endometrium: Secondary | ICD-10-CM

## 2016-05-15 DIAGNOSIS — Z823 Family history of stroke: Secondary | ICD-10-CM | POA: Insufficient documentation

## 2016-05-15 DIAGNOSIS — Z8041 Family history of malignant neoplasm of ovary: Secondary | ICD-10-CM | POA: Diagnosis not present

## 2016-05-15 DIAGNOSIS — Z803 Family history of malignant neoplasm of breast: Secondary | ICD-10-CM | POA: Diagnosis not present

## 2016-05-15 DIAGNOSIS — Z8507 Personal history of malignant neoplasm of pancreas: Secondary | ICD-10-CM

## 2016-05-15 DIAGNOSIS — Z96653 Presence of artificial knee joint, bilateral: Secondary | ICD-10-CM | POA: Diagnosis not present

## 2016-05-15 DIAGNOSIS — M1990 Unspecified osteoarthritis, unspecified site: Secondary | ICD-10-CM | POA: Diagnosis not present

## 2016-05-15 DIAGNOSIS — Z86711 Personal history of pulmonary embolism: Secondary | ICD-10-CM | POA: Insufficient documentation

## 2016-05-15 DIAGNOSIS — Z800 Family history of malignant neoplasm of digestive organs: Secondary | ICD-10-CM | POA: Insufficient documentation

## 2016-05-15 DIAGNOSIS — I4891 Unspecified atrial fibrillation: Secondary | ICD-10-CM

## 2016-05-15 DIAGNOSIS — Z9851 Tubal ligation status: Secondary | ICD-10-CM | POA: Diagnosis not present

## 2016-05-15 DIAGNOSIS — Z90710 Acquired absence of both cervix and uterus: Secondary | ICD-10-CM | POA: Diagnosis not present

## 2016-05-15 DIAGNOSIS — Z8701 Personal history of pneumonia (recurrent): Secondary | ICD-10-CM | POA: Diagnosis not present

## 2016-05-15 DIAGNOSIS — Z8542 Personal history of malignant neoplasm of other parts of uterus: Secondary | ICD-10-CM | POA: Diagnosis not present

## 2016-05-15 DIAGNOSIS — K648 Other hemorrhoids: Secondary | ICD-10-CM | POA: Diagnosis not present

## 2016-05-15 DIAGNOSIS — K219 Gastro-esophageal reflux disease without esophagitis: Secondary | ICD-10-CM | POA: Diagnosis not present

## 2016-05-15 DIAGNOSIS — Z923 Personal history of irradiation: Secondary | ICD-10-CM | POA: Insufficient documentation

## 2016-05-15 DIAGNOSIS — Z95828 Presence of other vascular implants and grafts: Secondary | ICD-10-CM

## 2016-05-15 DIAGNOSIS — Z9049 Acquired absence of other specified parts of digestive tract: Secondary | ICD-10-CM | POA: Diagnosis not present

## 2016-05-15 DIAGNOSIS — I480 Paroxysmal atrial fibrillation: Secondary | ICD-10-CM | POA: Diagnosis not present

## 2016-05-15 DIAGNOSIS — I10 Essential (primary) hypertension: Secondary | ICD-10-CM | POA: Diagnosis not present

## 2016-05-15 DIAGNOSIS — C259 Malignant neoplasm of pancreas, unspecified: Secondary | ICD-10-CM | POA: Diagnosis not present

## 2016-05-15 DIAGNOSIS — Z452 Encounter for adjustment and management of vascular access device: Secondary | ICD-10-CM

## 2016-05-15 DIAGNOSIS — Z888 Allergy status to other drugs, medicaments and biological substances status: Secondary | ICD-10-CM | POA: Diagnosis not present

## 2016-05-15 DIAGNOSIS — Z01411 Encounter for gynecological examination (general) (routine) with abnormal findings: Secondary | ICD-10-CM | POA: Diagnosis present

## 2016-05-15 DIAGNOSIS — Z8619 Personal history of other infectious and parasitic diseases: Secondary | ICD-10-CM | POA: Insufficient documentation

## 2016-05-15 DIAGNOSIS — C250 Malignant neoplasm of head of pancreas: Secondary | ICD-10-CM

## 2016-05-15 DIAGNOSIS — Z881 Allergy status to other antibiotic agents status: Secondary | ICD-10-CM | POA: Diagnosis not present

## 2016-05-15 DIAGNOSIS — Z833 Family history of diabetes mellitus: Secondary | ICD-10-CM | POA: Insufficient documentation

## 2016-05-15 DIAGNOSIS — Z7901 Long term (current) use of anticoagulants: Secondary | ICD-10-CM | POA: Insufficient documentation

## 2016-05-15 DIAGNOSIS — Z885 Allergy status to narcotic agent status: Secondary | ICD-10-CM | POA: Diagnosis not present

## 2016-05-15 DIAGNOSIS — Z808 Family history of malignant neoplasm of other organs or systems: Secondary | ICD-10-CM | POA: Insufficient documentation

## 2016-05-15 DIAGNOSIS — Z8249 Family history of ischemic heart disease and other diseases of the circulatory system: Secondary | ICD-10-CM | POA: Insufficient documentation

## 2016-05-15 MED ORDER — HEPARIN SOD (PORK) LOCK FLUSH 100 UNIT/ML IV SOLN
500.0000 [IU] | Freq: Once | INTRAVENOUS | Status: AC | PRN
  Administered 2016-05-15: 500 [IU] via INTRAVENOUS
  Filled 2016-05-15: qty 5

## 2016-05-15 MED ORDER — SODIUM CHLORIDE 0.9 % IJ SOLN
10.0000 mL | INTRAMUSCULAR | Status: DC | PRN
  Administered 2016-05-15: 10 mL via INTRAVENOUS
  Filled 2016-05-15: qty 10

## 2016-05-15 NOTE — Patient Instructions (Signed)
Follow-up with Dr. [name] as scheduled. Return to see Dr. Alycia Rossetti in 6 months and Dr. Sondra Come in 3 months. We will notify you of the results of your Pap smear.  Please call us after you see Dr. Sondra Come to schedule.
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
## 2016-05-15 NOTE — Patient Instructions (Signed)

## 2016-05-15 NOTE — Progress Notes (Signed)
[facility] OFFICE PROGRESS NOTE   Diagnosis: Pancreas cancer  INTERVAL HISTORY:   [name] returns as scheduled. She feels well. Good appetite. No complaint.  Objective:  Vital signs in last 24 hours:  Blood pressure (!) 106/49, pulse (!) 46, temperature 98 F (36.7 C), temperature source Oral, resp. rate 17, height 5\' 3"  (1.6 m), weight 176 lb 8 oz (80.1 kg), SpO2 99 %.    HEENT: Neck without mass Lymphatics: No cervical, supraclavicular, axillary, or inguinal nodes Resp: Lungs clear bilaterally Cardio: Regular rate and rhythm GI: No hepatomegaly, no mass or no apparent ascites Vascular: No leg edema   Portacath/PICC-without erythema  Lab Results:  Lab Results  Component Value Date   WBC 3.6 (L) 10/18/2015   HGB 11.0 (L) 10/18/2015   HCT 32.8 (L) 10/18/2015   MCV 88.6 10/18/2015   PLT 186 10/18/2015   NEUTROABS 1.7 10/18/2015   CA 19-9 on 04/04/2016: 86   Medications: I have reviewed the patient's current medications.  Assessment/Plan: 1. 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 Elevated CA 19-9 ? CT chest 08/04/2014-negative for metastatic disease ? Pancreaticoduodenectomy 08/30/2014, stage II (T3 N0) moderately differential adenocarcinoma, negative resection margins (1 mm retroperitoneal margin) ? Initiation of adjuvant gemcitabine 10/26/2014. ? Gemcitabine held 11/02/2014 due to neutropenia. ? Gemcitabine 11/09/2014 dose reduced 800 mg/m. ? Gemcitabine held 11/16/2014 due to neutropenia. ? Gemcitabine resumed 11/23/2014 every 2 week schedule. ? Cycle 6 gemcitabine 01/05/2015 ? Cycle 7 gemcitabine 01/18/2015 ? Cycle 8 gemcitabine 02/01/2015 ? Cycle 9 gemcitabine 02/15/2015 ? Cycle 10 gemcitabine 03/01/2015 ? Cycle 11 gemcitabine 03/15/2015 ? Cycle 12 gemcitabine 03/29/2015  2. 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 Admission with post ERCP pancreatitis 06/16/2014  4. History of abdominal pain secondary to #1  5. Pulmonary embolism diagnosed on a CT of the abdomen 09/16/2014  Negative lower extremity Dopplers 09/17/2014  6. Multiple orthopedic surgical procedures  7. Endometrial cancer,stage IA, grade 1 endometrioid adenocarcinoma, 18% myometrial invasion, no lymphovascular space involvement, negative washings  Status post robotic total hysterectomy and bilateral salpingo-oophorectomy 11/30/2010  Recurrent tumor left lateral vagina status post biopsy 11/24/2014 with pathology confirming adenocarcinoma with focal squamous differentiation consistent with endometrial adenocarcinoma  Staging CT scans 12/06/2014 with no evidence of local pancreatic cancer recurrence. Small fluid collection adjacent to the left adrenal gland. Severe hepatic steatosis. No evidence of local extension of endometrial carcinoma. Carcinoma not well-defined at the vaginal cuff. 5 mm right external iliac lymph node. 3.6 mm left external iliac lymph node  Brachytherapy initiated 12/22/2014, completed 01/19/2015  CT abdomen/pelvis 07/24/2015 revealed a 3 x 4 cm soft tissue focus at the vaginal apex  PET scan 08/11/2015 revealed no mass at the vaginal apex and no evidence of metastatic disease  8. History of atrial fibrillation-maintained on xarelto  9. Family history of multiple cancers-negative CancerNext gene panel  10. Prolonged nausea following the pancreaticoduodenectomy. Improved 10/26/2014.  11. Port-A-Cath placement 10/21/2014.  12. History of Neutropenia secondary to chemotherapy   13. Diarrhea. Question pancreatic insufficiency. Pancreatic enzyme replacement initiated 01/05/2015. Recurrent diarrhea following a course of antibiotics March 2017.  14. History of positional vertigo-resolved   Disposition:  [name] remains in clinical remission from pancreas cancer. We  will follow-up on the CA 19-9 from today. If the CA 19-9 is significantly higher the plan is to schedule restaging CTs.  She  will be scheduled for a Port-A-Cath flush in 6 weeks and an office visit in 6 months. We will see her sooner as needed.  Betsy Coder, MD  05/15/2016  9:48 AM

## 2016-05-15 NOTE — Telephone Encounter (Signed)
GAVE PATIENT SPOUSE AVS REPORT AND APPOINTMENTS FOR October AND November.

## 2016-05-16 LAB — CANCER ANTIGEN 19-9: CAN 19-9: 131 U/mL — AB (ref 0–35)

## 2016-05-17 ENCOUNTER — Encounter: Payer: Self-pay | Admitting: Oncology

## 2016-05-17 ENCOUNTER — Telehealth: Payer: Self-pay | Admitting: *Deleted

## 2016-05-17 DIAGNOSIS — C541 Malignant neoplasm of endometrium: Secondary | ICD-10-CM

## 2016-05-17 DIAGNOSIS — C250 Malignant neoplasm of head of pancreas: Secondary | ICD-10-CM

## 2016-05-17 NOTE — Telephone Encounter (Signed)
Per Dr. [name], pt informed that Ca19-9 is slightly higher and that we will schedule a CT of the c/a/p in approx 4-6 weeks with labs prior to CT and office visit with Dr. [name] or Lattie Haw 1-2 days after CT.  Pt appreciative of information and has no questions at this time.  Message sent to schedulers.

## 2016-05-17 NOTE — Telephone Encounter (Signed)
-----   Message from Ladell Pier, MD sent at 05/17/2016 12:22 PM EDT ----- Please call patient, ca19-9 is slightly higher, schedule CT chest and CT abd/pelvis 4-6 weeks, bmet/ca19-9 via port day of CT, offfice sherrill  or lisa 1-2 days after CT

## 2016-05-19 ENCOUNTER — Telehealth: Payer: Self-pay | Admitting: Oncology

## 2016-05-19 NOTE — Telephone Encounter (Signed)
S/w pt, advised appts scheduled per md req labs/ct.md in 4-6wks. Gave appt 9/25 @ 8am for lab/flush and advised radiology will call with ct scan same day. Gv md appt 9/28 @ 10am for results. Advised pt 10/4 flush appt canceled. Pt verbalized understanding.

## 2016-05-20 LAB — CYTOLOGY - PAP

## 2016-05-21 ENCOUNTER — Telehealth: Payer: Self-pay

## 2016-05-21 NOTE — Telephone Encounter (Signed)
Orders received from [facility] to contact the patient with her PAP results being "normal" . Patient contacted and update , patient states understanding , denies further questions at this time.

## 2016-06-17 ENCOUNTER — Other Ambulatory Visit (HOSPITAL_BASED_OUTPATIENT_CLINIC_OR_DEPARTMENT_OTHER): Payer: Medicare Other

## 2016-06-17 ENCOUNTER — Ambulatory Visit (HOSPITAL_COMMUNITY)
Admission: RE | Admit: 2016-06-17 | Discharge: 2016-06-17 | Disposition: A | Discharge status: Home / Self Care | Payer: Medicare Other | Source: Ambulatory Visit | Attending: Oncology | Admitting: Oncology

## 2016-06-17 ENCOUNTER — Ambulatory Visit (HOSPITAL_BASED_OUTPATIENT_CLINIC_OR_DEPARTMENT_OTHER): Payer: Medicare Other

## 2016-06-17 DIAGNOSIS — I4891 Unspecified atrial fibrillation: Secondary | ICD-10-CM

## 2016-06-17 DIAGNOSIS — R938 Abnormal findings on diagnostic imaging of other specified body structures: Secondary | ICD-10-CM | POA: Diagnosis not present

## 2016-06-17 DIAGNOSIS — C541 Malignant neoplasm of endometrium: Secondary | ICD-10-CM

## 2016-06-17 DIAGNOSIS — Z452 Encounter for adjustment and management of vascular access device: Secondary | ICD-10-CM | POA: Diagnosis not present

## 2016-06-17 DIAGNOSIS — C250 Malignant neoplasm of head of pancreas: Secondary | ICD-10-CM | POA: Diagnosis present

## 2016-06-17 DIAGNOSIS — Z8507 Personal history of malignant neoplasm of pancreas: Secondary | ICD-10-CM

## 2016-06-17 DIAGNOSIS — Z8542 Personal history of malignant neoplasm of other parts of uterus: Secondary | ICD-10-CM

## 2016-06-17 DIAGNOSIS — Z95828 Presence of other vascular implants and grafts: Secondary | ICD-10-CM

## 2016-06-17 LAB — BASIC METABOLIC PANEL
ANION GAP: 9 meq/L (ref 3–11)
BUN: 9.1 mg/dL (ref 7.0–26.0)
CALCIUM: 9.2 mg/dL (ref 8.4–10.4)
CHLORIDE: 107 meq/L (ref 98–109)
CO2: 25 meq/L (ref 22–29)
CREATININE: 0.7 mg/dL (ref 0.6–1.1)
EGFR: 88 mL/min/{1.73_m2} — ABNORMAL LOW (ref >90)
Glucose: 93 mg/dl (ref 70–140)
POTASSIUM: 4.1 meq/L (ref 3.5–5.1)
SODIUM: 140 meq/L (ref 136–145)

## 2016-06-17 MED ORDER — IOPAMIDOL (ISOVUE-300) INJECTION 61%
100.0000 mL | Freq: Once | INTRAVENOUS | Status: AC | PRN
  Administered 2016-06-17: 100 mL via INTRAVENOUS

## 2016-06-17 MED ORDER — SODIUM CHLORIDE 0.9 % IJ SOLN
10.0000 mL | INTRAMUSCULAR | Status: DC | PRN
  Administered 2016-06-17: 10 mL via INTRAVENOUS
  Filled 2016-06-17: qty 10

## 2016-06-17 NOTE — Patient Instructions (Signed)

## 2016-06-18 LAB — CANCER ANTIGEN 19-9: CA 19-9: 150 U/mL — ABNORMAL HIGH (ref 0–35)

## 2016-06-20 ENCOUNTER — Telehealth: Payer: Self-pay | Admitting: Oncology

## 2016-06-20 ENCOUNTER — Ambulatory Visit (HOSPITAL_BASED_OUTPATIENT_CLINIC_OR_DEPARTMENT_OTHER): Payer: Medicare Other | Admitting: Oncology

## 2016-06-20 VITALS — BP 115/62 | HR 51 | Temp 97.9°F | Resp 18 | Ht 63.0 in | Wt 176.9 lb

## 2016-06-20 DIAGNOSIS — C250 Malignant neoplasm of head of pancreas: Secondary | ICD-10-CM

## 2016-06-20 DIAGNOSIS — C541 Malignant neoplasm of endometrium: Secondary | ICD-10-CM | POA: Diagnosis not present

## 2016-06-20 NOTE — Progress Notes (Signed)
[facility] OFFICE PROGRESS NOTE   Diagnosis: Pancreas cancer  INTERVAL HISTORY:   Ms. Hinzman returns as scheduled. She complains of pain in the upper abdomen and back. She has lower back pain and underwent an MRI via Dr. Wynelle Link. She reports spinal stenosis was noted. No evidence of cancer. She is not taking pain medication. Good energy level.  Objective:  Vital signs in last 24 hours:  Blood pressure 115/62, pulse (!) 51, temperature 97.9 F (36.6 C), temperature source Oral, resp. rate 18, height 5\' 3"  (1.6 m), weight 176 lb 14.4 oz (80.2 kg), SpO2 99 %.    HEENT: Neck without mass Lymphatics: No cervical, supraclavicular, axillary, or inguinal nodes Resp: Lungs clear bilaterally Cardio: Regular rate and rhythm GI: No hepatomegaly, no mass, no apparent ascites, tender in the medial right subcostal region. Vascular: No leg edema   Portacath/PICC-without erythema  Lab Results:  Lab Results  Component Value Date   WBC 3.6 (L) 10/18/2015   HGB 11.0 (L) 10/18/2015   HCT 32.8 (L) 10/18/2015   MCV 88.6 10/18/2015   PLT 186 10/18/2015   NEUTROABS 1.7 10/18/2015   CA 19-9 on 06/17/2016-150  Imaging:  Ct Chest W Contrast  Result Date: 06/17/2016 CLINICAL DATA:  Endometrial cancer diagnosed 11/2014, status post chemotherapy and XRT. History of pancreatic cancer diagnosed 05/2014, status post Whipple. Elevated CA 19 9. EXAM: CT CHEST, ABDOMEN, AND PELVIS WITH CONTRAST TECHNIQUE: Multidetector CT imaging of the chest, abdomen and pelvis was performed following the standard protocol during bolus administration of intravenous contrast. CONTRAST:  170mL ISOVUE-300 IOPAMIDOL (ISOVUE-300) INJECTION 61% COMPARISON:  PET-CT dated 08/11/2015. FINDINGS: CT CHEST FINDINGS Cardiovascular: Heart is normal in size.  No pericardial effusion. Ectasia of the ascending thoracic aorta, measuring 3.7 cm. Left chest port terminating in the mid SVC. Mediastinum/Nodes: Small mediastinal  lymph nodes, including a 9 mm short axis low right paratracheal node (series 2/ image 16). No suspicious hilar or axillary lymphadenopathy. Visualized thyroid is notable for a 7 mm left thyroid nodule (series 2/image 5). Lungs/Pleura: 4 mm subpleural nodule in the left upper lobe (series 4/ image 32), unchanged. Additional subpleural nodularity measuring 2-3 mm in the left upper lobe (series 2/images 21, 24, 26, 28, and 32). No new/ suspicious pulmonary nodules.  No focal consolidation. No pleural effusion or pneumothorax. Musculoskeletal: Left shoulder arthroplasty. Degenerative changes of the thoracic spine. Multiple Schmorl's nodes deformities. CT ABDOMEN PELVIS FINDINGS Hepatobiliary: Liver is notable for hepatic steatosis with focal fatty sparing posteriorly along segment 4B (series 2/ image 54). Status post cholecystectomy. No intrahepatic or extrahepatic ductal dilatation. Pancreas: Status post resection of the pancreatic head/ uncinate process. Fatty atrophy of the pancreatic body/ tail. Spleen: Within normal limits. Adrenals/Urinary Tract: Adrenal glands are within normal limits. Kidneys are within normal limits.  No hydronephrosis. Bladder is within normal limits. Stomach/Bowel: Status post partial gastrectomy with gastrojejunostomy. No evidence of bowel obstruction. Normal appendix (series 2/image 79). Vascular/Lymphatic: No evidence of abdominal aortic aneurysm. Abnormal soft tissue at the root of the small bowel mesentery, measuring approximately 3.0 x 3.9 cm (series 2/ image 64), new. This extends along the SMA and possibly the SMV. Reproductive: Status post hysterectomy. No adnexal masses. Other: No abdominopelvic ascites. Musculoskeletal: Degenerative changes of the lumbar spine. Schmorl nodes deformities at L4 and L5, with moderate superior endplate changes at L5. IMPRESSION: Abnormal soft tissue at the root of the small bowel mesentery, involving the SMA and possibly the SMV, measuring  approximately 3.0 x 3.9 cm,  worrisome for recurrent pancreatic cancer. The lesion may be difficult to access percutaneously for tissue confirmation. EUS is likely difficult given the patient's surgical history. Given this, repeat PET-CT may be beneficial to confirm hypermetabolism. Electronically Signed   By: Julian Hy M.D.   On: 06/17/2016 12:02   Ct Abdomen Pelvis W Contrast  Result Date: 06/17/2016 CLINICAL DATA:  Endometrial cancer diagnosed 11/2014, status post chemotherapy and XRT. History of pancreatic cancer diagnosed 05/2014, status post Whipple. Elevated CA 19 9. EXAM: CT CHEST, ABDOMEN, AND PELVIS WITH CONTRAST TECHNIQUE: Multidetector CT imaging of the chest, abdomen and pelvis was performed following the standard protocol during bolus administration of intravenous contrast. CONTRAST:  156mL ISOVUE-300 IOPAMIDOL (ISOVUE-300) INJECTION 61% COMPARISON:  PET-CT dated 08/11/2015. FINDINGS: CT CHEST FINDINGS Cardiovascular: Heart is normal in size.  No pericardial effusion. Ectasia of the ascending thoracic aorta, measuring 3.7 cm. Left chest port terminating in the mid SVC. Mediastinum/Nodes: Small mediastinal lymph nodes, including a 9 mm short axis low right paratracheal node (series 2/ image 16). No suspicious hilar or axillary lymphadenopathy. Visualized thyroid is notable for a 7 mm left thyroid nodule (series 2/image 5). Lungs/Pleura: 4 mm subpleural nodule in the left upper lobe (series 4/ image 32), unchanged. Additional subpleural nodularity measuring 2-3 mm in the left upper lobe (series 2/images 21, 24, 26, 28, and 32). No new/ suspicious pulmonary nodules.  No focal consolidation. No pleural effusion or pneumothorax. Musculoskeletal: Left shoulder arthroplasty. Degenerative changes of the thoracic spine. Multiple Schmorl's nodes deformities. CT ABDOMEN PELVIS FINDINGS Hepatobiliary: Liver is notable for hepatic steatosis with focal fatty sparing posteriorly along segment 4B (series 2/  image 54). Status post cholecystectomy. No intrahepatic or extrahepatic ductal dilatation. Pancreas: Status post resection of the pancreatic head/ uncinate process. Fatty atrophy of the pancreatic body/ tail. Spleen: Within normal limits. Adrenals/Urinary Tract: Adrenal glands are within normal limits. Kidneys are within normal limits.  No hydronephrosis. Bladder is within normal limits. Stomach/Bowel: Status post partial gastrectomy with gastrojejunostomy. No evidence of bowel obstruction. Normal appendix (series 2/image 79). Vascular/Lymphatic: No evidence of abdominal aortic aneurysm. Abnormal soft tissue at the root of the small bowel mesentery, measuring approximately 3.0 x 3.9 cm (series 2/ image 64), new. This extends along the SMA and possibly the SMV. Reproductive: Status post hysterectomy. No adnexal masses. Other: No abdominopelvic ascites. Musculoskeletal: Degenerative changes of the lumbar spine. Schmorl nodes deformities at L4 and L5, with moderate superior endplate changes at L5. IMPRESSION: Abnormal soft tissue at the root of the small bowel mesentery, involving the SMA and possibly the SMV, measuring approximately 3.0 x 3.9 cm, worrisome for recurrent pancreatic cancer. The lesion may be difficult to access percutaneously for tissue confirmation. EUS is likely difficult given the patient's surgical history. Given this, repeat PET-CT may be beneficial to confirm hypermetabolism. Electronically Signed   By: Julian Hy M.D.   On: 06/17/2016 12:02    Medications: I have reviewed the patient's current medications.  Assessment/Plan: 1. 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 Elevated CA 19-9 ? CT chest 08/04/2014-negative for metastatic disease ? Pancreaticoduodenectomy 08/30/2014, stage II (T3 N0) moderately differential adenocarcinoma, negative resection margins (1 mm retroperitoneal margin) ? Initiation of adjuvant  gemcitabine 10/26/2014. ? Gemcitabine held 11/02/2014 due to neutropenia. ? Gemcitabine 11/09/2014 dose reduced 800 mg/m. ? Gemcitabine held 11/16/2014 due to neutropenia. ? Gemcitabine resumed 11/23/2014 every 2 week schedule. ? Cycle 6 gemcitabine 01/05/2015 ? Cycle 7  gemcitabine 01/18/2015 ? Cycle 8 gemcitabine 02/01/2015 ? Cycle 9 gemcitabine 02/15/2015 ? Cycle 10 gemcitabine 03/01/2015 ? Cycle 11 gemcitabine 03/15/2015 ? Cycle 12 gemcitabine 03/29/2015 ? Elevated CA 19-01 May 2016 ? CTs 06/17/2016-new soft tissue mass at the root of the mesentery with vascular involvement  2. 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 Admission with post ERCP pancreatitis 06/16/2014  4. History of abdominal pain secondary to #1  5. Pulmonary embolism diagnosed on a CT of the abdomen 09/16/2014  Negative lower extremity Dopplers 09/17/2014  6. Multiple orthopedic surgical procedures  7. Endometrial cancer,stage IA, grade 1 endometrioid adenocarcinoma, 18% myometrial invasion, no lymphovascular space involvement, negative washings  Status post robotic total hysterectomy and bilateral salpingo-oophorectomy 11/30/2010  Recurrent tumor left lateral vagina status post biopsy 11/24/2014 with pathology confirming adenocarcinoma with focal squamous differentiation consistent with endometrial adenocarcinoma  Staging CT scans 12/06/2014 with no evidence of local pancreatic cancer recurrence. Small fluid collection adjacent to the left adrenal gland. Severe hepatic steatosis. No evidence of local extension of endometrial carcinoma. Carcinoma not well-defined at the vaginal cuff. 5 mm right external iliac lymph node. 3.6 mm left external iliac lymph node  Brachytherapy initiated 12/22/2014, completed 01/19/2015  CT abdomen/pelvis 07/24/2015 revealed a 3 x 4 cm soft tissue focus at the vaginal apex  PET scan 08/11/2015 revealed no  mass at the vaginal apex and no evidence of metastatic disease  8. History of atrial fibrillation-maintained on xarelto  9. Family history of multiple cancers-negative CancerNext gene panel  10. Prolonged nausea following the pancreaticoduodenectomy. Improved 10/26/2014.  11. Port-A-Cath placement 10/21/2014.  12. History of Neutropenia secondary to chemotherapy   13. Diarrhea. Question pancreatic insufficiency. Pancreatic enzyme replacement initiated 01/05/2015. Recurrent diarrhea following a course of antibiotics March 2017.  14. History of positional vertigo-resolved  15. Pain-abdominal pain likely secondary to the mesenteric mass, back pain may be related to the mass versus benign musculoskeletal disease   Disposition:  [name] appears to have recurrent pancreas cancer. I reviewed the CT images with her today. We discussed treatment options. She agrees to a staging PET scan. If there is no other evidence of metastatic disease Dr. Jaynie Crumble will consider palliative radiation. We will recommend systemic chemotherapy if there are multiple areas of her current disease.  I encouraged her to use hydrocodone as needed for pain. She will contact me if the hydrocodone does not relieve her pain.  She will return for an office visit on 06/28/2016.  Betsy Coder, MD  06/20/2016  10:45 AM

## 2016-06-20 NOTE — Telephone Encounter (Signed)
GAVE PATIENT AVS REPORT AND APPOINTMENTS FOR October. [facility] WILL CALL RE SCAN. °

## 2016-06-27 ENCOUNTER — Encounter (HOSPITAL_COMMUNITY)
Admission: RE | Admit: 2016-06-27 | Discharge: 2016-06-27 | Disposition: A | Discharge status: Home / Self Care | Payer: Medicare Other | Source: Ambulatory Visit | Attending: Oncology | Admitting: Oncology

## 2016-06-27 DIAGNOSIS — C250 Malignant neoplasm of head of pancreas: Secondary | ICD-10-CM | POA: Insufficient documentation

## 2016-06-27 LAB — GLUCOSE, CAPILLARY: Glucose-Capillary: 113 mg/dL — ABNORMAL HIGH (ref 65–99)

## 2016-06-27 MED ORDER — FLUDEOXYGLUCOSE F - 18 (FDG) INJECTION
8.6300 | Freq: Once | INTRAVENOUS | Status: AC | PRN
  Administered 2016-06-27: 8.63 via INTRAVENOUS

## 2016-06-28 ENCOUNTER — Telehealth: Payer: Self-pay | Admitting: Oncology

## 2016-06-28 ENCOUNTER — Other Ambulatory Visit: Payer: Self-pay | Admitting: *Deleted

## 2016-06-28 ENCOUNTER — Ambulatory Visit (HOSPITAL_BASED_OUTPATIENT_CLINIC_OR_DEPARTMENT_OTHER): Payer: Medicare Other | Admitting: Oncology

## 2016-06-28 VITALS — BP 119/64 | HR 50 | Temp 97.8°F | Resp 18 | Ht 63.0 in | Wt 177.1 lb

## 2016-06-28 DIAGNOSIS — C250 Malignant neoplasm of head of pancreas: Secondary | ICD-10-CM | POA: Diagnosis not present

## 2016-06-28 DIAGNOSIS — R109 Unspecified abdominal pain: Secondary | ICD-10-CM | POA: Diagnosis not present

## 2016-06-28 DIAGNOSIS — C541 Malignant neoplasm of endometrium: Secondary | ICD-10-CM

## 2016-06-28 MED ORDER — HYDROCODONE-ACETAMINOPHEN 5-325 MG PO TABS: 1.0000 | ORAL_TABLET | Freq: Four times a day (QID) | ORAL | 0 refills | Status: DC | PRN

## 2016-06-28 NOTE — Progress Notes (Signed)
[facility] OFFICE PROGRESS NOTE   Diagnosis:  Pancreas cancer  INTERVAL HISTORY:   Ms. Moleski returns as scheduled. She continues to have pain in the abdomen admitted lower back. Hydrocodone helps the pain. No other complaint.  Objective:  Vital signs in last 24 hours:  Blood pressure 119/64, pulse (!) 50, temperature 97.8 F (36.6 C), temperature source Oral, resp. rate 18, height 5\' 3"  (1.6 m), weight 177 lb 1.6 oz (80.3 kg), SpO2 100 %.   Physical examination: Not performed today  Lab Results:  Lab Results  Component Value Date   WBC 3.6 (L) 10/18/2015   HGB 11.0 (L) 10/18/2015   HCT 32.8 (L) 10/18/2015   MCV 88.6 10/18/2015   PLT 186 10/18/2015   NEUTROABS 1.7 10/18/2015     Imaging:  Nm Pet Image Restag (ps) Skull Base To Thigh  Result Date: 06/27/2016 CLINICAL DATA:  Initial treatment strategy for pancreatic carcinoma. diagnosed in 2015 status post Whipple procedure and endometrial cancer in 2012 status post total hysterectomy. EXAM: NUCLEAR MEDICINE PET SKULL BASE TO THIGH TECHNIQUE: 8.6 mCi F-18 FDG was injected intravenously. Full-ring PET imaging was performed from the skull base to thigh after the radiotracer. CT data was obtained and used for attenuation correction and anatomic localization. FASTING BLOOD GLUCOSE:  Value: 113 mg/dl COMPARISON:  PET-CT 08/11/2015, CT abdomen 06/17/2016 FINDINGS: NECK No hypermetabolic lymph nodes in the neck. CHEST No hypermetabolic mediastinal hilar lymph nodes. Noncalcified nodule in the LEFT upper lobe measures 5 mm (image 57, series 4) and not changed from comparison PET-CT scan (08/11/2015). ABDOMEN/PELVIS Soft tissue thickening in the central abdominal mesenteries and adjacent to surgical clips measure approximate 2.8 by 2.3 cm and has moderate metabolic activity with SUV max equal 5.6. This corresponds to the abnormality on comparison CT. There are no hypermetabolic periportal or peripancreatic lymph nodes. No  hypermetabolic lesion within the liver. Patent steatosis is noted. No more distant hypermetabolic lymph nodes in the abdomen pelvis. No peritoneal nodularity. SKELETON No focal hypermetabolic activity to suggest skeletal metastasis. IMPRESSION: 1. Soft tissue thickening adjacent to surgical clips in the central mesenteries has moderate hypermetabolic activity and remains concerning for local pancreatic carcinoma recurrence. 2. No evidence of local nodal metastatic disease or extra pancreatic organ metastasis. 3. No evidence distant metastatic disease. 4. Hepatic steatosis. Electronically Signed   By: Suzy Bouchard M.D.   On: 06/27/2016 10:42    Medications: I have reviewed the patient's current medications.  Assessment/Plan: 1. 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 Elevated CA 19-9 ? CT chest 08/04/2014-negative for metastatic disease ? Pancreaticoduodenectomy 08/30/2014, stage II (T3 N0) moderately differential adenocarcinoma, negative resection margins (1 mm retroperitoneal margin) ? Initiation of adjuvant gemcitabine 10/26/2014. ? Gemcitabine held 11/02/2014 due to neutropenia. ? Gemcitabine 11/09/2014 dose reduced 800 mg/m. ? Gemcitabine held 11/16/2014 due to neutropenia. ? Gemcitabine resumed 11/23/2014 every 2 week schedule. ? Cycle 6 gemcitabine 01/05/2015 ? Cycle 7 gemcitabine 01/18/2015 ? Cycle 8 gemcitabine 02/01/2015 ? Cycle 9 gemcitabine 02/15/2015 ? Cycle 10 gemcitabine 03/01/2015 ? Cycle 11 gemcitabine 03/15/2015 ? Cycle 12 gemcitabine 03/29/2015 ? Elevated CA 19-01 May 2016 ? CTs 06/17/2016-new soft tissue mass at the root of the mesentery with vascular involvement ? PET 06/27/2016  2. 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 Admission with post  ERCP pancreatitis 06/16/2014  4. History of abdominal pain secondary to #1  5. Pulmonary embolism diagnosed on a CT of the abdomen 09/16/2014  Negative lower extremity Dopplers 09/17/2014  6. Multiple orthopedic surgical procedures  7. Endometrial cancer,stage IA, grade 1 endometrioid adenocarcinoma, 18% myometrial invasion, no lymphovascular space involvement, negative washings  Status post robotic total hysterectomy and bilateral salpingo-oophorectomy 11/30/2010  Recurrent tumor left lateral vagina status post biopsy 11/24/2014 with pathology confirming adenocarcinoma with focal squamous differentiation consistent with endometrial adenocarcinoma  Staging CT scans 12/06/2014 with no evidence of local pancreatic cancer recurrence. Small fluid collection adjacent to the left adrenal gland. Severe hepatic steatosis. No evidence of local extension of endometrial carcinoma. Carcinoma not well-defined at the vaginal cuff. 5 mm right external iliac lymph node. 3.6 mm left external iliac lymph node  Brachytherapy initiated 12/22/2014, completed 01/19/2015  CT abdomen/pelvis 07/24/2015 revealed a 3 x 4 cm soft tissue focus at the vaginal apex  PET scan 08/11/2015 revealed no mass at the vaginal apex and no evidence of metastatic disease  8. History of atrial fibrillation-maintained on xarelto  9. Family history of multiple cancers-negative CancerNext gene panel  10. Prolonged nausea following the pancreaticoduodenectomy. Improved 10/26/2014.  11. Port-A-Cath placement 10/21/2014.  12. History of Neutropenia secondary to chemotherapy   13. Diarrhea. Question pancreatic insufficiency. Pancreatic enzyme replacement initiated 01/05/2015. Recurrent diarrhea following a course of antibiotics March 2017.  14. History of positional vertigo-resolved  15. Pain-abdominal pain likely secondary to the mesenteric mass, back pain may be related to the mass versus benign  musculoskeletal disease     Disposition:  Ms. Alice appears unchanged. She will continue hydrocodone as needed for pain. I reviewed the CT and PET images with her today. She appears to have a local recurrence of pancreas cancer. No therapy will be curative.  We discussed treatment options. She will see Dr. Sondra Come next week to discuss palliative radiation. I will plan for a course of concurrent capecitabine if Dr. Sondra Come recommends an extended course of radiation.  We reviewed the potential toxicities associated with capecitabine including the chance for nausea, alopecia, mucositis, diarrhea, and hematologic toxicity. We discussed the sensitivity, hyperpigmentation, and hand/foot syndrome associated with capecitabine.  She will be scheduled for an office visit in 2 weeks.  Betsy Coder, MD  06/28/2016  8:14 AM

## 2016-06-28 NOTE — Telephone Encounter (Signed)
Avs report and appointment schedule given to patient per 06/28/16 los. °

## 2016-07-01 NOTE — Progress Notes (Signed)
GI Location of Tumor / Histology: Pancreas cancer  [name] presented symptoms of: pain in the upper abdomen and back.    Biopsies revealed:   08/30/14 Diagnosis 1. Whipple procedure/resection, Pancreas - INVASIVE DUCTAL ADENOCARCINOMA, SEE COMMENT. - TUMOR INVADES INTO SMALL BOWEL. - POSITIVE FOR PERINEURAL INVASION. - NEGATIVE FOR LYMPH VASCULAR INVASION. - BILE DUCT MARGIN, NEGATIVE FOR TUMOR. - PANCREATIC DUCT MARGIN, NEGATIVE FOR TUMOR. - GASTRIC AND SMALL BOWEL MARGINS, NEGATIVE FOR TUMOR. - RETROPERITONEAL/UNCINATE SOFT TISSUE MARGIN, NEGATIVE FOR TUMOR. - EIGHT LYMPH NODES, NEGATIVE FOR TUMOR (0/8). - LOW AND HIGH GRADE PANCREATIC INTRAEPITHELIAL LESION PRESENT (PANIN-1, 2 AND AND 3). - BENIGN GALLBLADDER WITH CHRONIC CHOLECYSTITIS. - CHOLELITHIASIS. - INCIDENTAL BENIGN LIVER. 2. Lymph node, biopsy, Hepatic - ONE LYMPH NODE, NEGATIVE FOR TUMOR (0/1). 3. Omentum, resection for tumor - BENIGN FIBROVASCULAR AND ADIPOSE OMENTAL SOFT TISSUE. - NEGATIVE FOR MALIGNANCY.  Past/Anticipated interventions by surgeon, if any: 08/30/14 - Procedure: WHIPPLE PROCEDURE;  Surgeon: [name], MD  Past/Anticipated interventions by medical oncology, if any: adjuvant gemcitabine 10/26/2014 finished 03/29/2015.  Dr. [name] will plan for  a course of concurrent capecitabine if needed with radiation.  Weight changes, if any: no  Bowel/Bladder complaints, if any: no  Nausea / Vomiting, if any: no  Pain issues, if any:  Yes - she is taking norco 1-2 times per day.  She report the pain was better this week until today.  Any blood per rectum:   no  SAFETY ISSUES:  Prior radiation? 12/22/14, 12/29/14, 01/05/15, 01/12/15, 01/19/15 - vaginal vault 30 Gy  Pacemaker/ICD? no  Possible current pregnancy? no  Is the patient on methotrexate? no  Current Complaints/Details:  Patient is here with her husband.  BP 123/67 (BP Location: Left Arm, Patient Position: Sitting)   Pulse (!) 52    Temp 97.9 F (36.6 C) (Oral)   Ht 5\' 3"  (1.6 m)   Wt 177 lb 3.2 oz (80.4 kg)   SpO2 99%   BMI 31.39 kg/m    Wt Readings from Last 3 Encounters:  07/05/16 177 lb 3.2 oz (80.4 kg)  06/28/16 177 lb 1.6 oz (80.3 kg)  06/20/16 176 lb 14.4 oz (80.2 kg)

## 2016-07-05 ENCOUNTER — Encounter: Payer: Self-pay | Admitting: Radiation Oncology

## 2016-07-05 ENCOUNTER — Ambulatory Visit
Admission: RE | Admit: 2016-07-05 | Discharge: 2016-07-05 | Disposition: A | Discharge status: Home / Self Care | Payer: Medicare Other | Source: Ambulatory Visit | Attending: Radiation Oncology | Admitting: Radiation Oncology

## 2016-07-05 VITALS — BP 123/67 | HR 52 | Temp 97.9°F | Ht 63.0 in | Wt 177.2 lb

## 2016-07-05 DIAGNOSIS — Z510 Encounter for antineoplastic radiation therapy: Secondary | ICD-10-CM | POA: Insufficient documentation

## 2016-07-05 DIAGNOSIS — C250 Malignant neoplasm of head of pancreas: Secondary | ICD-10-CM | POA: Diagnosis present

## 2016-07-05 DIAGNOSIS — C541 Malignant neoplasm of endometrium: Secondary | ICD-10-CM | POA: Insufficient documentation

## 2016-07-05 NOTE — Progress Notes (Signed)
Radiation Oncology         [phone] ________________________________  Name: [name] MRN: [id number]  Date: 07/05/2016  DOB: 03/13/1946  Re-evaluation Note  CC: Chesley Noon, MD  Ladell Pier, MD    ICD-9-CM ICD-10-CM   1. Adenocarcinoma of head of pancreas (HCC) 157.0 C25.0   2. Endometrial ca ([place]) 182.0 C54.1     Diagnosis:   Recurrent pancreatic cancer, prior history of recurrent endometrial cancer  Interval Since Last Radiation:  One year and 5 months for treatments for vaginal recurrence of endometrial cancer (12/22/2014, 12/29/2014, 01/05/2015, 01/12/2015, 01/19/2015)  patient received intracavitary brachytherapy treatments using iridium 192 as the high-dose-rate source.  Narrative:   Patient returns today for re-evaluation at the courtesy of Dr. [name]. Patient has been having upper abdominal pain and mid back pain similar to how she initially presented with pancreatic cancer. In light of these findings a PET scan was performed.  This showed soft tissue thickening adjacent to surgical clips in the central mesentery area with moderate hypermetabolic activity consistent with local recurrence of pancreatic cancer. There was no evidence of nodal metastasis or distant metastatic disease. No evidence for recurrence of the patient's endometrial cancer. Patient is now seen in radiation oncology  to be considered for treatment for her recurrent pancreatic cancer.  Patient continues to have intermittent nausea controlled with Phenergan which is been a chronic problem for her.                                ALLERGIES:  is allergic to ace inhibitors; codeine; scopolamine; and sulfa antibiotics.  Meds: Current Outpatient Prescriptions  Medication Sig Dispense Refill  . acetaminophen (TYLENOL) 500 MG tablet Take 1,000 mg by mouth every 6 (six) hours as needed for pain.     Marland Kitchen b complex vitamins tablet Take 1 tablet by mouth daily.    . flecainide (TAMBOCOR) 100 MG tablet  Take 1 tablet (100 mg total) by mouth 2 (two) times daily. 60 tablet 5  . fluticasone (FLONASE) 50 MCG/ACT nasal spray Place 1 spray into both nostrils 2 (two) times daily as needed.     Marland Kitchen HYDROcodone-acetaminophen (NORCO/VICODIN) 5-325 MG tablet Take 1 tablet by mouth every 6 (six) hours as needed for moderate pain. 60 tablet 0  . lidocaine-prilocaine (EMLA) cream Apply small amount over port area 1-2 hours prior to treatment and cover with plastic wrap.  DO NOT RUB IN. 30 g prn  . lipase/protease/amylase (CREON) 12000 UNITS CPEP capsule Take 2 capsules (24,000 Units total) by mouth 3 (three) times daily before meals. 180 capsule 2  . LORazepam (ATIVAN) 1 MG tablet Take 0.5 tablets (0.5 mg total) by mouth every 8 (eight) hours as needed for anxiety. 45 tablet 0  . losartan (COZAAR) 50 MG tablet TAKE 1 TABLET BY MOUTH EVERY DAY 30 tablet 8  . metoprolol succinate (TOPROL-XL) 25 MG 24 hr tablet Take 0.5 tablets (12.5 mg total) by mouth daily. Take 1/2 tablet by mouth daily 45 tablet 3  . Multiple Vitamins-Minerals (CENTRUM SILVER PO) Take 1 tablet by mouth every morning.     . vitamin C (ASCORBIC ACID) 500 MG tablet Take 500 mg by mouth every morning.     [name] 20 MG TABS tablet TAKE 1 TABLET (20 MG TOTAL) BY MOUTH DAILY. 90 tablet 1  . zolpidem (AMBIEN CR) 6.25 MG CR tablet      No current facility-administered  medications for this encounter.    Facility-Administered Medications Ordered in Other Encounters  Medication Dose Route Frequency Provider Last Rate Last Dose  . sodium chloride 0.9 % bolus 1,000 mL  1,000 mL Intravenous Once Coralie Keens, MD        Physical Findings: The patient is in no acute distress. Patient is alert and oriented.  height is 5\' 3"  (1.6 m) and weight is 177 lb 3.2 oz (80.4 kg). Her oral temperature is 97.9 F (36.6 C). Her blood pressure is 123/67 and her pulse is 52 (abnormal). Her oxygen saturation is 99%. .  No palpable supraclavicular or axillary  adenopathy. The lungs are clear to auscultation. The heart has a regular rhythm and rate. The abdomen is soft with normal bowel sounds. No palpable epigastric mass. Patient has mild tenderness with palpation along the scar above her umbilicus associated with her Whipple procedure.  Lab Findings: Lab Results  Component Value Date   WBC 3.6 (L) 10/18/2015   HGB 11.0 (L) 10/18/2015   HCT 32.8 (L) 10/18/2015   MCV 88.6 10/18/2015   PLT 186 10/18/2015    Radiographic Findings: Ct Chest W Contrast  Result Date: 06/17/2016 CLINICAL DATA:  Endometrial cancer diagnosed 11/2014, status post chemotherapy and XRT. History of pancreatic cancer diagnosed 05/2014, status post Whipple. Elevated CA 19 9. EXAM: CT CHEST, ABDOMEN, AND PELVIS WITH CONTRAST TECHNIQUE: Multidetector CT imaging of the chest, abdomen and pelvis was performed following the standard protocol during bolus administration of intravenous contrast. CONTRAST:  136mL ISOVUE-300 IOPAMIDOL (ISOVUE-300) INJECTION 61% COMPARISON:  PET-CT dated 08/11/2015. FINDINGS: CT CHEST FINDINGS Cardiovascular: Heart is normal in size.  No pericardial effusion. Ectasia of the ascending thoracic aorta, measuring 3.7 cm. Left chest port terminating in the mid SVC. Mediastinum/Nodes: Small mediastinal lymph nodes, including a 9 mm short axis low right paratracheal node (series 2/ image 16). No suspicious hilar or axillary lymphadenopathy. Visualized thyroid is notable for a 7 mm left thyroid nodule (series 2/image 5). Lungs/Pleura: 4 mm subpleural nodule in the left upper lobe (series 4/ image 32), unchanged. Additional subpleural nodularity measuring 2-3 mm in the left upper lobe (series 2/images 21, 24, 26, 28, and 32). No new/ suspicious pulmonary nodules.  No focal consolidation. No pleural effusion or pneumothorax. Musculoskeletal: Left shoulder arthroplasty. Degenerative changes of the thoracic spine. Multiple Schmorl's nodes deformities. CT ABDOMEN PELVIS  FINDINGS Hepatobiliary: Liver is notable for hepatic steatosis with focal fatty sparing posteriorly along segment 4B (series 2/ image 54). Status post cholecystectomy. No intrahepatic or extrahepatic ductal dilatation. Pancreas: Status post resection of the pancreatic head/ uncinate process. Fatty atrophy of the pancreatic body/ tail. Spleen: Within normal limits. Adrenals/Urinary Tract: Adrenal glands are within normal limits. Kidneys are within normal limits.  No hydronephrosis. Bladder is within normal limits. Stomach/Bowel: Status post partial gastrectomy with gastrojejunostomy. No evidence of bowel obstruction. Normal appendix (series 2/image 79). Vascular/Lymphatic: No evidence of abdominal aortic aneurysm. Abnormal soft tissue at the root of the small bowel mesentery, measuring approximately 3.0 x 3.9 cm (series 2/ image 64), new. This extends along the SMA and possibly the SMV. Reproductive: Status post hysterectomy. No adnexal masses. Other: No abdominopelvic ascites. Musculoskeletal: Degenerative changes of the lumbar spine. Schmorl nodes deformities at L4 and L5, with moderate superior endplate changes at L5. IMPRESSION: Abnormal soft tissue at the root of the small bowel mesentery, involving the SMA and possibly the SMV, measuring approximately 3.0 x 3.9 cm, worrisome for recurrent pancreatic cancer. The lesion may be difficult  to access percutaneously for tissue confirmation. EUS is likely difficult given the patient's surgical history. Given this, repeat PET-CT may be beneficial to confirm hypermetabolism. Electronically Signed   By: Julian Hy M.D.   On: 06/17/2016 12:02   Ct Abdomen Pelvis W Contrast  Result Date: 06/17/2016 CLINICAL DATA:  Endometrial cancer diagnosed 11/2014, status post chemotherapy and XRT. History of pancreatic cancer diagnosed 05/2014, status post Whipple. Elevated CA 19 9. EXAM: CT CHEST, ABDOMEN, AND PELVIS WITH CONTRAST TECHNIQUE: Multidetector CT imaging of the  chest, abdomen and pelvis was performed following the standard protocol during bolus administration of intravenous contrast. CONTRAST:  158mL ISOVUE-300 IOPAMIDOL (ISOVUE-300) INJECTION 61% COMPARISON:  PET-CT dated 08/11/2015. FINDINGS: CT CHEST FINDINGS Cardiovascular: Heart is normal in size.  No pericardial effusion. Ectasia of the ascending thoracic aorta, measuring 3.7 cm. Left chest port terminating in the mid SVC. Mediastinum/Nodes: Small mediastinal lymph nodes, including a 9 mm short axis low right paratracheal node (series 2/ image 16). No suspicious hilar or axillary lymphadenopathy. Visualized thyroid is notable for a 7 mm left thyroid nodule (series 2/image 5). Lungs/Pleura: 4 mm subpleural nodule in the left upper lobe (series 4/ image 32), unchanged. Additional subpleural nodularity measuring 2-3 mm in the left upper lobe (series 2/images 21, 24, 26, 28, and 32). No new/ suspicious pulmonary nodules.  No focal consolidation. No pleural effusion or pneumothorax. Musculoskeletal: Left shoulder arthroplasty. Degenerative changes of the thoracic spine. Multiple Schmorl's nodes deformities. CT ABDOMEN PELVIS FINDINGS Hepatobiliary: Liver is notable for hepatic steatosis with focal fatty sparing posteriorly along segment 4B (series 2/ image 54). Status post cholecystectomy. No intrahepatic or extrahepatic ductal dilatation. Pancreas: Status post resection of the pancreatic head/ uncinate process. Fatty atrophy of the pancreatic body/ tail. Spleen: Within normal limits. Adrenals/Urinary Tract: Adrenal glands are within normal limits. Kidneys are within normal limits.  No hydronephrosis. Bladder is within normal limits. Stomach/Bowel: Status post partial gastrectomy with gastrojejunostomy. No evidence of bowel obstruction. Normal appendix (series 2/image 79). Vascular/Lymphatic: No evidence of abdominal aortic aneurysm. Abnormal soft tissue at the root of the small bowel mesentery, measuring approximately  3.0 x 3.9 cm (series 2/ image 64), new. This extends along the SMA and possibly the SMV. Reproductive: Status post hysterectomy. No adnexal masses. Other: No abdominopelvic ascites. Musculoskeletal: Degenerative changes of the lumbar spine. Schmorl nodes deformities at L4 and L5, with moderate superior endplate changes at L5. IMPRESSION: Abnormal soft tissue at the root of the small bowel mesentery, involving the SMA and possibly the SMV, measuring approximately 3.0 x 3.9 cm, worrisome for recurrent pancreatic cancer. The lesion may be difficult to access percutaneously for tissue confirmation. EUS is likely difficult given the patient's surgical history. Given this, repeat PET-CT may be beneficial to confirm hypermetabolism. Electronically Signed   By: Julian Hy M.D.   On: 06/17/2016 12:02   Nm Pet Image Restag (ps) Skull Base To Thigh  Result Date: 06/27/2016 CLINICAL DATA:  Initial treatment strategy for pancreatic carcinoma. diagnosed in 2015 status post Whipple procedure and endometrial cancer in 2012 status post total hysterectomy. EXAM: NUCLEAR MEDICINE PET SKULL BASE TO THIGH TECHNIQUE: 8.6 mCi F-18 FDG was injected intravenously. Full-ring PET imaging was performed from the skull base to thigh after the radiotracer. CT data was obtained and used for attenuation correction and anatomic localization. FASTING BLOOD GLUCOSE:  Value: 113 mg/dl COMPARISON:  PET-CT 08/11/2015, CT abdomen 06/17/2016 FINDINGS: NECK No hypermetabolic lymph nodes in the neck. CHEST No hypermetabolic mediastinal hilar lymph nodes. Noncalcified  nodule in the LEFT upper lobe measures 5 mm (image 57, series 4) and not changed from comparison PET-CT scan (08/11/2015). ABDOMEN/PELVIS Soft tissue thickening in the central abdominal mesenteries and adjacent to surgical clips measure approximate 2.8 by 2.3 cm and has moderate metabolic activity with SUV max equal 5.6. This corresponds to the abnormality on comparison CT. There are  no hypermetabolic periportal or peripancreatic lymph nodes. No hypermetabolic lesion within the liver. Patent steatosis is noted. No more distant hypermetabolic lymph nodes in the abdomen pelvis. No peritoneal nodularity. SKELETON No focal hypermetabolic activity to suggest skeletal metastasis. IMPRESSION: 1. Soft tissue thickening adjacent to surgical clips in the central mesenteries has moderate hypermetabolic activity and remains concerning for local pancreatic carcinoma recurrence. 2. No evidence of local nodal metastatic disease or extra pancreatic organ metastasis. 3. No evidence distant metastatic disease. 4. Hepatic steatosis. Electronically Signed   By: Suzy Bouchard M.D.   On: 06/27/2016 10:42    Impression:  Local recurrence of pancreatic cancer. The patient is symptomatic from her recurrence. Location is not amenable to biopsy and endoscopic ultrasound would be difficult given the patient's prior surgical history. The patient would be a candidate for radiation therapy. Options to consider would be external beam radiation therapy with radiosensitizing chemotherapy or SBRT.  Given the isolated area of local recurrence however I feel the best treatment for the patient would be SBRT. She has clips in the area of recurrence which we could localize for her stereotactic body radiation therapy. I discussed treatment course side effects and potential toxicities of radiation therapy in this situation with the patient and her husband. She appears to understand wishes to proceed with planned course of treatment  Plan:  Simulation on October 16 with treatments to begin soon afterwards.  I anticipate  5 treatments.  ____________________________________ Gery Pray, MD

## 2016-07-05 NOTE — Progress Notes (Signed)
Please see the Nurse Progress Note in the MD Initial Consult Encounter for this patient. 

## 2016-07-08 ENCOUNTER — Ambulatory Visit
Admission: RE | Admit: 2016-07-08 | Discharge: 2016-07-08 | Disposition: A | Discharge status: Home / Self Care | Payer: Medicare Other | Source: Ambulatory Visit | Attending: Radiation Oncology | Admitting: Radiation Oncology

## 2016-07-08 ENCOUNTER — Ambulatory Visit
Admission: RE | Admit: 2016-07-08 | Discharge: 2016-07-08 | Disposition: A | Discharge status: Home / Self Care | Payer: Self-pay | Source: Ambulatory Visit | Attending: Oncology | Admitting: Oncology

## 2016-07-08 ENCOUNTER — Other Ambulatory Visit: Payer: Self-pay | Admitting: Oncology

## 2016-07-08 VITALS — BP 118/68 | HR 45 | Temp 97.9°F | Resp 12

## 2016-07-08 DIAGNOSIS — C250 Malignant neoplasm of head of pancreas: Secondary | ICD-10-CM

## 2016-07-08 DIAGNOSIS — C801 Malignant (primary) neoplasm, unspecified: Secondary | ICD-10-CM

## 2016-07-08 DIAGNOSIS — Z510 Encounter for antineoplastic radiation therapy: Secondary | ICD-10-CM | POA: Diagnosis not present

## 2016-07-08 MED ORDER — HEPARIN SOD (PORK) LOCK FLUSH 100 UNIT/ML IV SOLN
500.0000 [IU] | Freq: Once | INTRAVENOUS | Status: AC
  Administered 2016-07-08: 500 [IU] via INTRAVENOUS

## 2016-07-08 MED ORDER — SODIUM CHLORIDE 0.9% FLUSH
10.0000 mL | Freq: Once | INTRAVENOUS | Status: AC
  Administered 2016-07-08: 10 mL via INTRAVENOUS

## 2016-07-08 NOTE — Progress Notes (Signed)
  Radiation Oncology         [phone] ________________________________  Name: [name] MRN: [id number]  Date: 07/08/2016  DOB: [birth year]  SIMULATION AND TREATMENT PLANNING NOTE    ICD-9-CM ICD-10-CM   1. Cancer of head of pancreas-S/P Whipple 08/30/14 157.0 C25.0     DIAGNOSIS:  Recurrent pancreatic cancer, prior history of recurrent endometrial cancer  NARRATIVE:  The patient was brought to the [place].  Identity was confirmed.  All relevant records and images related to the planned course of therapy were reviewed.  The patient freely provided informed written consent to proceed with treatment after reviewing the details related to the planned course of therapy. The consent form was witnessed and verified by the simulation staff.  Then, the patient was set-up in a stable reproducible  supine position for radiation therapy.  CT images were obtained.  Surface markings were placed.  The CT images were loaded into the planning software.  Then the target and avoidance structures were contoured.  Treatment planning then occurred.  The radiation prescription was entered and confirmed.  Then, I designed and supervised the construction of a total of 5 medically necessary complex treatment devices.  I have requested : 3D Simulation  I have requested a DVH of the following structures: GTV, PTV, stomach, liver, kidneys, spinal cord.  I have ordered: dose calc. A special treatment procedure requested given the high dose fractionation scheme (SBRT).  PLAN:  The patient will receive 33 Gy in 5 fractions.  -----------------------------------  Blair Promise, PhD, MD  This document serves as a record of services personally performed by Gery Pray, MD. It was created on his behalf by Darcus Austin, a trained medical scribe. The creation of this record is based on the scribe's personal observations and the provider's statements to them. This document has been checked and  approved by the attending provider.

## 2016-07-08 NOTE — Progress Notes (Signed)
Patent completed CT simulation for pancreas, Flushed 37ml normal saline via left power port, blood retuen excellent, then flushed with 500/units/32ml heparin per protocol, clamped port, then   D/c left power porta cath, 20 g 1 inch huber needle, all intact, 2x2 gause placed over site and paper taped, bleeding some, held pressure over site 2 minutes, patient is on Zarelto also, gave extra gause and band aid, informed patien to keep dressing on for a few hours then can put band aid over site,verbal understanding, patient tolerated well 2:43 PM

## 2016-07-09 ENCOUNTER — Telehealth: Payer: Self-pay | Admitting: *Deleted

## 2016-07-09 ENCOUNTER — Other Ambulatory Visit: Payer: Self-pay | Admitting: Radiation Oncology

## 2016-07-09 ENCOUNTER — Other Ambulatory Visit: Payer: Self-pay | Admitting: *Deleted

## 2016-07-09 ENCOUNTER — Telehealth: Payer: Self-pay | Admitting: Oncology

## 2016-07-09 DIAGNOSIS — C250 Malignant neoplasm of head of pancreas: Secondary | ICD-10-CM

## 2016-07-09 MED ORDER — LIDOCAINE-PRILOCAINE 2.5-2.5 % EX CREA: TOPICAL_CREAM | CUTANEOUS | 99 refills | Status: DC

## 2016-07-09 MED ORDER — PROMETHAZINE HCL 12.5 MG PO TABS: 12.5000 mg | ORAL_TABLET | ORAL | 1 refills | Status: DC | PRN

## 2016-07-09 NOTE — Telephone Encounter (Signed)
Message left on patient's private home phone number to inform her that her appt with Dr. [name] will be moved from 07/12/16 to 07/31/16 and to call [place] back with any questions.

## 2016-07-09 NOTE — Telephone Encounter (Addendum)
Called [name] to see which antiemetic worked best for her.  She said that she is currently taking Zofran 4 mg because that is what she had left.  She said that phenergan worked best for her in the past but can't remember the dosage amount.  Advised her that this medication will make her sleepy.  [name] verbalized understanding.  She also asked if she could get a refill on EMLA cream and said Dr. [name] filled it last.  Savannah Alberts, RN in Dr. Gearldine Shown office to see if the EMLA can be refilled.

## 2016-07-12 ENCOUNTER — Ambulatory Visit: Payer: Medicare Other | Admitting: Oncology

## 2016-07-15 DIAGNOSIS — Z510 Encounter for antineoplastic radiation therapy: Secondary | ICD-10-CM | POA: Diagnosis not present

## 2016-07-16 ENCOUNTER — Ambulatory Visit
Admission: RE | Admit: 2016-07-16 | Discharge: 2016-07-16 | Disposition: A | Discharge status: Home / Self Care | Payer: Medicare Other | Source: Ambulatory Visit | Attending: Radiation Oncology | Admitting: Radiation Oncology

## 2016-07-16 ENCOUNTER — Encounter: Payer: Self-pay | Admitting: Radiation Oncology

## 2016-07-16 VITALS — BP 119/71 | HR 50 | Temp 97.6°F | Ht 63.0 in | Wt 180.6 lb

## 2016-07-16 DIAGNOSIS — C250 Malignant neoplasm of head of pancreas: Secondary | ICD-10-CM

## 2016-07-16 DIAGNOSIS — Z510 Encounter for antineoplastic radiation therapy: Secondary | ICD-10-CM | POA: Diagnosis not present

## 2016-07-16 NOTE — Progress Notes (Signed)
  Radiation Oncology         (336) [phone] ________________________________  Name: ARYON KIBE MRN: [id number]  Date: 07/16/2016  DOB: [birth year]  Stereotactic Body Radiotherapy Treatment Procedure Note  NARRATIVE:  [name] was brought to the stereotactic radiation treatment machine and placed supine on the CT couch. The patient was set up for stereotactic body radiotherapy on the body fix pillow.  3D TREATMENT PLANNING AND DOSIMETRY:  The patient's radiation plan was reviewed and approved prior to starting treatment.  It showed 3-dimensional radiation distributions overlaid onto the planning CT.  The [facility] for the target structures as well as the organs at risk were reviewed. The documentation of this is filed in the radiation oncology EMR.  SIMULATION VERIFICATION:  The patient underwent CT imaging on the treatment unit.  These were carefully aligned to document that the ablative radiation dose would cover the target volume and maximally spare the nearby organs at risk according to the planned distribution.  SPECIAL TREATMENT PROCEDURE: [name] received high dose ablative stereotactic body radiotherapy to the planned target volume without unforeseen complications. Treatment was delivered uneventfully. The high doses associated with stereotactic body radiotherapy and the significant potential risks require careful treatment set up and patient monitoring constituting a special treatment procedure   STEREOTACTIC TREATMENT MANAGEMENT:  Following delivery, the patient was evaluated clinically. The patient tolerated treatment without significant acute effects, and was discharged to home in stable condition.    PLAN: Continue treatment as planned.  ________________________________  Blair Promise, PhD, MD   This document serves as a record of services personally performed by Gery Pray, MD. It was created on his behalf by Truddie Hidden, a trained medical scribe. The  creation of this record is based on the scribe's personal observations and the provider's statements to them. This document has been checked and approved by the attending provider.

## 2016-07-16 NOTE — Progress Notes (Signed)
  Radiation Oncology         [phone]) [phone] ________________________________  Name: [name] MRN: [id number]  Date: 07/16/2016  DOB: 13-Dec-1945  Weekly Radiation Therapy Management    ICD-9-CM ICD-10-CM   1. Cancer of head of pancreas-S/P Whipple 08/30/14 157.0 C25.0        Recurrent pancreatic cancer, prior history of recurrent endometrial cancer    Current Dose: 6.6 Gy     Planned Dose:  33 Gy    Narrative . . . . . . . . The patient presents for routine under treatment assessment. [name] presents for her first fraction of radiation to her Abdomen (SBRT). She denies pain. She denies nausea. She does anxiety today, but feels like she is doing well overall. She is eating well. She has no concerns at this time.                                    The patient is without complaint.                                 Set-up films were reviewed.                                 The chart was checked. Physical Findings. . .  height is 5\' 3"  (1.6 m) and weight is 180 lb 9.6 oz (81.9 kg). Her temperature is 97.6 F (36.4 C). Her blood pressure is 119/71 and her pulse is 50 (abnormal). Her oxygen saturation is 100%. . Weight essentially stable.  No significant changes. Impression . . . . . . . The patient is tolerating radiation. Plan . . . . . . . . . . . . Continue treatment as planned.  ________________________________   Blair Promise, PhD, MD  This document serves as a record of services personally performed by Gery Pray, MD. It was created on his behalf by Truddie Hidden, a trained medical scribe. The creation of this record is based on the scribe's personal observations and the provider's statements to them. This document has been checked and approved by the attending provider.

## 2016-07-16 NOTE — Progress Notes (Signed)
Ms. Lye presents for her first fraction of radiation to her Abdomen. She denies pain. She denies nausea. She does anxiety today, but feels like she is doing well overall. She is eating well. She has no concerns at this time.  BP 119/71   Pulse (!) 50   Temp 97.6 F (36.4 C)   Ht 5\' 3"  (1.6 m)   Wt 180 lb 9.6 oz (81.9 kg)   SpO2 100% Comment: room air  BMI 31.99 kg/m    Wt Readings from Last 3 Encounters:  07/16/16 180 lb 9.6 oz (81.9 kg)  07/05/16 177 lb 3.2 oz (80.4 kg)  06/28/16 177 lb 1.6 oz (80.3 kg)

## 2016-07-17 ENCOUNTER — Ambulatory Visit: Payer: Medicare Other | Admitting: Radiation Oncology

## 2016-07-18 ENCOUNTER — Ambulatory Visit
Admission: RE | Admit: 2016-07-18 | Discharge: 2016-07-18 | Disposition: A | Discharge status: Home / Self Care | Payer: Medicare Other | Source: Ambulatory Visit | Attending: Radiation Oncology | Admitting: Radiation Oncology

## 2016-07-18 ENCOUNTER — Other Ambulatory Visit: Payer: Self-pay | Admitting: Cardiology

## 2016-07-18 DIAGNOSIS — C250 Malignant neoplasm of head of pancreas: Secondary | ICD-10-CM

## 2016-07-18 DIAGNOSIS — Z510 Encounter for antineoplastic radiation therapy: Secondary | ICD-10-CM | POA: Diagnosis not present

## 2016-07-18 NOTE — Progress Notes (Signed)
  Radiation Oncology         [phone]) [phone] ________________________________  Name: [name] MRN: [id number]  Date: 07/18/2016  DOB: [birth year]  Stereotactic Body Radiotherapy Treatment Procedure Note  NARRATIVE:  [name] was brought to the stereotactic radiation treatment machine and placed supine on the CT couch. The patient was set up for stereotactic body radiotherapy on the body fix pillow.  3D TREATMENT PLANNING AND DOSIMETRY:  The patient's radiation plan was reviewed and approved prior to starting treatment.  It showed 3-dimensional radiation distributions overlaid onto the planning CT.  The [facility] for the target structures as well as the organs at risk were reviewed. The documentation of this is filed in the radiation oncology EMR.  SIMULATION VERIFICATION:  The patient underwent CT imaging on the treatment unit.  These were carefully aligned to document that the ablative radiation dose would cover the target volume and maximally spare the nearby organs at risk according to the planned distribution.  SPECIAL TREATMENT PROCEDURE: [name] received high dose ablative stereotactic body radiotherapy to the planned target volume without unforeseen complications. Treatment was delivered uneventfully. The high doses associated with stereotactic body radiotherapy and the significant potential risks require careful treatment set up and patient monitoring constituting a special treatment procedure   STEREOTACTIC TREATMENT MANAGEMENT:  Following delivery, the patient was evaluated clinically. The patient tolerated treatment without significant acute effects, and was discharged to home in stable condition.    PLAN: Continue treatment as planned.  ________________________________  Blair Promise, PhD, MD

## 2016-07-19 ENCOUNTER — Ambulatory Visit: Payer: Medicare Other | Admitting: Radiation Oncology

## 2016-07-22 ENCOUNTER — Ambulatory Visit
Admission: RE | Admit: 2016-07-22 | Discharge: 2016-07-22 | Disposition: A | Discharge status: Home / Self Care | Payer: Medicare Other | Source: Ambulatory Visit | Attending: Radiation Oncology | Admitting: Radiation Oncology

## 2016-07-22 DIAGNOSIS — Z510 Encounter for antineoplastic radiation therapy: Secondary | ICD-10-CM | POA: Diagnosis not present

## 2016-07-23 NOTE — Addendum Note (Signed)
Encounter addended by: Jacqulyn Liner, RN on: 07/23/2016  8:05 AM<BR>    Actions taken: Charge Capture section accepted

## 2016-07-24 ENCOUNTER — Ambulatory Visit
Admission: RE | Admit: 2016-07-24 | Discharge: 2016-07-24 | Disposition: A | Discharge status: Home / Self Care | Payer: Medicare Other | Source: Ambulatory Visit | Attending: Radiation Oncology | Admitting: Radiation Oncology

## 2016-07-24 DIAGNOSIS — Z510 Encounter for antineoplastic radiation therapy: Secondary | ICD-10-CM | POA: Diagnosis not present

## 2016-07-24 DIAGNOSIS — C250 Malignant neoplasm of head of pancreas: Secondary | ICD-10-CM

## 2016-07-24 NOTE — Progress Notes (Signed)
  Radiation Oncology         [phone] ________________________________  Name: [name] MRN: [id number]  Date: 07/24/2016  DOB: 11-16-1945  Stereotactic Body Radiotherapy Treatment Procedure Note  NARRATIVE:  [name] was brought to the stereotactic radiation treatment machine and placed supine on the CT couch. The patient was set up for stereotactic body radiotherapy on the body fix pillow.  3D TREATMENT PLANNING AND DOSIMETRY:  The patient's radiation plan was reviewed and approved prior to starting treatment.  It showed 3-dimensional radiation distributions overlaid onto the planning CT.  The [facility] for the target structures as well as the organs at risk were reviewed. The documentation of this is filed in the radiation oncology EMR.  SIMULATION VERIFICATION:  The patient underwent CT imaging on the treatment unit.  These were carefully aligned to document that the ablative radiation dose would cover the target volume and maximally spare the nearby organs at risk according to the planned distribution.  SPECIAL TREATMENT PROCEDURE: [name] received high dose ablative stereotactic body radiotherapy to the planned target volume without unforeseen complications. Treatment was delivered uneventfully. The high doses associated with stereotactic body radiotherapy and the significant potential risks require careful treatment set up and patient monitoring constituting a special treatment procedure   STEREOTACTIC TREATMENT MANAGEMENT:  Following delivery, the patient was evaluated clinically. The patient tolerated treatment without significant acute effects, and was discharged to home in stable condition.    PLAN: Continue treatment as planned.  ________________________________  Blair Promise, PhD, MD

## 2016-07-26 ENCOUNTER — Encounter: Payer: Self-pay | Admitting: Radiation Oncology

## 2016-07-26 ENCOUNTER — Ambulatory Visit
Admission: RE | Admit: 2016-07-26 | Discharge: 2016-07-26 | Disposition: A | Discharge status: Home / Self Care | Payer: Medicare Other | Source: Ambulatory Visit | Attending: Radiation Oncology | Admitting: Radiation Oncology

## 2016-07-26 DIAGNOSIS — Z510 Encounter for antineoplastic radiation therapy: Secondary | ICD-10-CM | POA: Diagnosis not present

## 2016-07-30 ENCOUNTER — Other Ambulatory Visit: Payer: Self-pay | Admitting: Cardiology

## 2016-07-31 ENCOUNTER — Telehealth: Payer: Self-pay | Admitting: Oncology

## 2016-07-31 ENCOUNTER — Ambulatory Visit (HOSPITAL_BASED_OUTPATIENT_CLINIC_OR_DEPARTMENT_OTHER): Payer: Medicare Other | Admitting: Oncology

## 2016-07-31 ENCOUNTER — Other Ambulatory Visit: Payer: Self-pay | Admitting: *Deleted

## 2016-07-31 VITALS — BP 111/60 | HR 50 | Temp 97.8°F | Resp 18 | Ht 63.0 in | Wt 177.1 lb

## 2016-07-31 DIAGNOSIS — C250 Malignant neoplasm of head of pancreas: Secondary | ICD-10-CM

## 2016-07-31 DIAGNOSIS — R109 Unspecified abdominal pain: Secondary | ICD-10-CM

## 2016-07-31 DIAGNOSIS — M549 Dorsalgia, unspecified: Secondary | ICD-10-CM | POA: Diagnosis not present

## 2016-07-31 DIAGNOSIS — Z8542 Personal history of malignant neoplasm of other parts of uterus: Secondary | ICD-10-CM | POA: Diagnosis not present

## 2016-07-31 MED ORDER — HYDROCODONE-ACETAMINOPHEN 5-325 MG PO TABS: 1.0000 | ORAL_TABLET | ORAL | 0 refills | Status: DC | PRN

## 2016-07-31 NOTE — Progress Notes (Signed)
[place] OFFICE PROGRESS NOTE   Diagnosis: Pancreas cancer  INTERVAL HISTORY:   Ms. Dorsey returns as scheduled. She completed SBRT treatment to the mesenteric mass on 07/26/2016. She reports fatigue following radiation. She continues to have abdominal pain. This has not changed with the radiation. She has intermittent nausea. Her bowels are moving. She takes hydrocodone approximately once per day for relief of pain.  Objective:  Vital signs in last 24 hours:  Blood pressure 111/60, pulse (!) 50, temperature 97.8 F (36.6 C), temperature source Oral, resp. rate 18, height 5\' 3"  (1.6 m), weight 177 lb 1.6 oz (80.3 kg), SpO2 99 %.    Resp: Lungs with inspiratory rales at the left posterior base, no respiratory distress Cardio: Regular rate and rhythm GI: No hepatosplenomegaly, no mass. Tender in the mid upper abdomen, mild tenderness in the right lower abdomen Vascular: No leg edema  Portacath/PICC-without erythema   Medications: I have reviewed the patient's current medications.  Assessment/Plan: 1. 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 Elevated CA 19-9 ? CT chest 08/04/2014-negative for metastatic disease ? Pancreaticoduodenectomy 08/30/2014, stage II (T3 N0) moderately differential adenocarcinoma, negative resection margins (1 mm retroperitoneal margin) ? Initiation of adjuvant gemcitabine 10/26/2014. ? Gemcitabine held 11/02/2014 due to neutropenia. ? Gemcitabine 11/09/2014 dose reduced 800 mg/m. ? Gemcitabine held 11/16/2014 due to neutropenia. ? Gemcitabine resumed 11/23/2014 every 2 week schedule. ? Cycle 6 gemcitabine 01/05/2015 ? Cycle 7 gemcitabine 01/18/2015 ? Cycle 8 gemcitabine 02/01/2015 ? Cycle 9 gemcitabine 02/15/2015 ? Cycle 10 gemcitabine 03/01/2015 ? Cycle 11 gemcitabine 03/15/2015 ? Cycle 12 gemcitabine 03/29/2015 ? Elevated CA 19-01 May 2016 ? CTs  06/17/2016-new soft tissue mass at the root of the mesentery with vascular involvement ? PET 06/27/2016-hypermetabolic activity associated with soft tissue adjacent to surgical clips in the central mesentery ? Status post SBRT to the mesenteric mass completed 07/26/2016  2. 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 Admission with post ERCP pancreatitis 06/16/2014  4. History of abdominal pain secondary to #1  5. Pulmonary embolism diagnosed on a CT of the abdomen 09/16/2014  Negative lower extremity Dopplers 09/17/2014  6. Multiple orthopedic surgical procedures  7. Endometrial cancer,stage IA, grade 1 endometrioid adenocarcinoma, 18% myometrial invasion, no lymphovascular space involvement, negative washings  Status post robotic total hysterectomy and bilateral salpingo-oophorectomy 11/30/2010  Recurrent tumor left lateral vagina status post biopsy 11/24/2014 with pathology confirming adenocarcinoma with focal squamous differentiation consistent with endometrial adenocarcinoma  Staging CT scans 12/06/2014 with no evidence of local pancreatic cancer recurrence. Small fluid collection adjacent to the left adrenal gland. Severe hepatic steatosis. No evidence of local extension of endometrial carcinoma. Carcinoma not well-defined at the vaginal cuff. 5 mm right external iliac lymph node. 3.6 mm left external iliac lymph node  Brachytherapy initiated 12/22/2014, completed 01/19/2015  CT abdomen/pelvis 07/24/2015 revealed a 3 x 4 cm soft tissue focus at the vaginal apex  PET scan 08/11/2015 revealed no mass at the vaginal apex and no evidence of metastatic disease  8. History of atrial fibrillation-maintained on xarelto  9. Family history of multiple cancers-negative CancerNext gene panel  10. Prolonged nausea following the  pancreaticoduodenectomy. Improved 10/26/2014.  11. Port-A-Cath placement 10/21/2014.  12. History of Neutropenia secondary to chemotherapy   13. Diarrhea. Question pancreatic insufficiency. Pancreatic enzyme replacement initiated  01/05/2015. Recurrent diarrhea following a course of antibiotics March 2017.  14. History of positional vertigo-resolved  15. Pain-abdominal and back pain-likely secondary to the mesenteric mass    Disposition:  [name] appears unchanged. She continues to have abdomen/back pain following the radiation. She will continue hydrocodone as needed for pain. She will return for an office visit, Port-A-Cath flush, and CA 19-9 on 106/27/2017.  She will contact us for increased pain. We will consider referring her for a celiac block if the pain persists when she returns in 2 weeks.  Betsy Coder, MD  07/31/2016  11:30 AM

## 2016-07-31 NOTE — Telephone Encounter (Signed)
Appointments scheduled per 11/8 LOS. Patient given AVS report and calendars of future scheduled appointments. °

## 2016-08-01 NOTE — Progress Notes (Signed)
  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 The patient denied pain or nausea during treatment. Her only complaint was anxiety.  Plan: The patient has completed radiation treatment. The patient will return to radiation oncology clinic for routine followup in one month. I advised them to call or return sooner if they have any questions or concerns related to their recovery or treatment.  -----------------------------------  Blair Promise, PhD, MD  This document serves as a record of services personally performed by Gery Pray, MD. It was created on his behalf by Darcus Austin, a trained medical scribe. The creation of this record is based on the scribe's personal observations and the provider's statements to them. This document has been checked and approved by the attending provider.

## 2016-08-08 ENCOUNTER — Ambulatory Visit: Payer: Medicare Other | Admitting: Nurse Practitioner

## 2016-08-08 ENCOUNTER — Other Ambulatory Visit: Payer: Medicare Other

## 2016-08-08 ENCOUNTER — Ambulatory Visit: Payer: Self-pay | Admitting: Radiation Oncology

## 2016-08-20 ENCOUNTER — Other Ambulatory Visit (HOSPITAL_BASED_OUTPATIENT_CLINIC_OR_DEPARTMENT_OTHER): Payer: Medicare Other

## 2016-08-20 ENCOUNTER — Ambulatory Visit (HOSPITAL_BASED_OUTPATIENT_CLINIC_OR_DEPARTMENT_OTHER): Payer: Medicare Other

## 2016-08-20 ENCOUNTER — Encounter: Payer: Self-pay | Admitting: Nurse Practitioner

## 2016-08-20 ENCOUNTER — Telehealth: Payer: Self-pay | Admitting: Nurse Practitioner

## 2016-08-20 ENCOUNTER — Ambulatory Visit: Payer: Medicare Other

## 2016-08-20 ENCOUNTER — Ambulatory Visit (HOSPITAL_BASED_OUTPATIENT_CLINIC_OR_DEPARTMENT_OTHER): Payer: Medicare Other | Admitting: Nurse Practitioner

## 2016-08-20 VITALS — BP 104/50 | HR 49 | Temp 97.9°F | Resp 17 | Ht 63.0 in | Wt 175.5 lb

## 2016-08-20 DIAGNOSIS — Z8542 Personal history of malignant neoplasm of other parts of uterus: Secondary | ICD-10-CM | POA: Diagnosis not present

## 2016-08-20 DIAGNOSIS — R109 Unspecified abdominal pain: Secondary | ICD-10-CM

## 2016-08-20 DIAGNOSIS — Z95828 Presence of other vascular implants and grafts: Secondary | ICD-10-CM

## 2016-08-20 DIAGNOSIS — Z452 Encounter for adjustment and management of vascular access device: Secondary | ICD-10-CM | POA: Diagnosis not present

## 2016-08-20 DIAGNOSIS — C250 Malignant neoplasm of head of pancreas: Secondary | ICD-10-CM

## 2016-08-20 DIAGNOSIS — C541 Malignant neoplasm of endometrium: Secondary | ICD-10-CM

## 2016-08-20 DIAGNOSIS — D701 Agranulocytosis secondary to cancer chemotherapy: Secondary | ICD-10-CM | POA: Diagnosis not present

## 2016-08-20 LAB — COMPREHENSIVE METABOLIC PANEL
ALBUMIN: 3.7 g/dL (ref 3.5–5.0)
ALK PHOS: 120 U/L (ref 40–150)
ALT: 37 U/L (ref 0–55)
ANION GAP: 8 meq/L (ref 3–11)
AST: 45 U/L — ABNORMAL HIGH (ref 5–34)
BILIRUBIN TOTAL: 0.71 mg/dL (ref 0.20–1.20)
BUN: 13 mg/dL (ref 7.0–26.0)
CALCIUM: 9.3 mg/dL (ref 8.4–10.4)
CO2: 24 mEq/L (ref 22–29)
CREATININE: 0.7 mg/dL (ref 0.6–1.1)
Chloride: 105 mEq/L (ref 98–109)
EGFR: 84 mL/min/{1.73_m2} — ABNORMAL LOW (ref >90)
Glucose: 87 mg/dl (ref 70–140)
Potassium: 4.1 mEq/L (ref 3.5–5.1)
Sodium: 136 mEq/L (ref 136–145)
TOTAL PROTEIN: 7 g/dL (ref 6.4–8.3)

## 2016-08-20 MED ORDER — SODIUM CHLORIDE 0.9 % IJ SOLN
10.0000 mL | INTRAMUSCULAR | Status: DC | PRN
  Administered 2016-08-20: 10 mL via INTRAVENOUS
  Filled 2016-08-20: qty 10

## 2016-08-20 MED ORDER — HEPARIN SOD (PORK) LOCK FLUSH 100 UNIT/ML IV SOLN
500.0000 [IU] | Freq: Once | INTRAVENOUS | Status: AC | PRN
  Administered 2016-08-20: 500 [IU] via INTRAVENOUS
  Filled 2016-08-20: qty 5

## 2016-08-20 NOTE — Telephone Encounter (Signed)
Appointment scheduled per 11/28 LOS. Patient given AVS report and calendars with scheduled appointments.  Patient aware of CT scan, given two bottles of Contrast and instructions.

## 2016-08-20 NOTE — Progress Notes (Addendum)
[place] OFFICE PROGRESS NOTE   Diagnosis:  Pancreas cancer  INTERVAL HISTORY:   [name] returns as scheduled. She continues to have abdominal and back pain. She estimates taking Vicodin twice a day with good relief. Bowels are moving. Mild intermittent nausea. She feels "weak".  Objective:  Vital signs in last 24 hours:  Blood pressure (!) 104/50, pulse (!) 49, temperature 97.9 F (36.6 C), temperature source Oral, resp. rate 17, height 5\' 3"  (1.6 m), weight 175 lb 8 oz (79.6 kg), SpO2 97 %.    HEENT: No thrush or ulcers. Resp: Lungs clear bilaterally. Cardio: Regular rate and rhythm. GI: Abdomen soft. No hepatomegaly. Tender mid upper abdomen. Vascular: No leg edema.  Port-A-Cath without erythema.  Lab Results:  Lab Results  Component Value Date   WBC 3.6 (L) 10/18/2015   HGB 11.0 (L) 10/18/2015   HCT 32.8 (L) 10/18/2015   MCV 88.6 10/18/2015   PLT 186 10/18/2015   NEUTROABS 1.7 10/18/2015    Imaging:  No results found.  Medications: I have reviewed the patient's current medications.  Assessment/Plan: 1. 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 Elevated CA 19-9 ? CT chest 08/04/2014-negative for metastatic disease ? Pancreaticoduodenectomy 08/30/2014, stage II (T3 N0) moderately differential adenocarcinoma, negative resection margins (1 mm retroperitoneal margin) ? Initiation of adjuvant gemcitabine 10/26/2014. ? Gemcitabine held 11/02/2014 due to neutropenia. ? Gemcitabine 11/09/2014 dose reduced 800 mg/m. ? Gemcitabine held 11/16/2014 due to neutropenia. ? Gemcitabine resumed 11/23/2014 every 2 week schedule. ? Cycle 6 gemcitabine 01/05/2015 ? Cycle 7 gemcitabine 01/18/2015 ? Cycle 8 gemcitabine 02/01/2015 ? Cycle 9 gemcitabine 02/15/2015 ? Cycle 10 gemcitabine 03/01/2015 ? Cycle 11 gemcitabine 03/15/2015 ? Cycle 12 gemcitabine 03/29/2015 ? Elevated CA 19-01 May 2016 ? CTs 06/17/2016-new soft tissue mass at the root of the mesentery with vascular involvement ? PET 06/27/2016-hypermetabolic activity associated with soft tissue adjacent to surgical clips in the central mesentery ? Status post SBRT to the mesenteric mass completed 07/26/2016  2. 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 Admission with post ERCP pancreatitis 06/16/2014  4. History of abdominal pain secondary to #1  5. Pulmonary embolism diagnosed on a CT of the abdomen 09/16/2014  Negative lower extremity Dopplers 09/17/2014  6. Multiple orthopedic surgical procedures  7. Endometrial cancer,stage IA, grade 1 endometrioid adenocarcinoma, 18% myometrial invasion, no lymphovascular space involvement, negative washings  Status post robotic total hysterectomy and bilateral salpingo-oophorectomy 11/30/2010  Recurrent tumor left lateral vagina status post biopsy 11/24/2014 with pathology confirming adenocarcinoma with focal squamous differentiation consistent with endometrial adenocarcinoma  Staging CT scans 12/06/2014 with no evidence of local pancreatic cancer recurrence. Small fluid collection adjacent to the left adrenal gland. Severe hepatic steatosis. No evidence of local extension of endometrial carcinoma. Carcinoma not well-defined at the vaginal cuff. 5 mm right external iliac lymph node. 3.6 mm left external iliac lymph node  Brachytherapy initiated 12/22/2014, completed 01/19/2015  CT abdomen/pelvis 07/24/2015 revealed a 3 x 4 cm soft tissue focus at the vaginal apex  PET scan 08/11/2015 revealed no mass at the vaginal apex and no evidence of metastatic disease  8. History of atrial fibrillation-maintained on xarelto  9. Family history of multiple cancers-negative CancerNext gene panel  10. Prolonged  nausea following the pancreaticoduodenectomy. Improved 10/26/2014.  11. Port-A-Cath placement 10/21/2014.  12. History of Neutropenia secondary to chemotherapy   13. Diarrhea. Question pancreatic insufficiency. Pancreatic enzyme replacement initiated 01/05/2015. Recurrent diarrhea following a course of antibiotics March 2017.  14. History of positional vertigo-resolved  15. Pain-abdominal and back pain-likely secondary to the mesenteric mass   Disposition: [name] continues to have abdominal and back pain. We discussed a referral for a celiac block. She is not interested in this at present. She will continue hydrocodone as needed. We are referring her for a restaging CT evaluation. We began preliminary discussion regarding systemic therapy on the FOLFOX regimen. She will return for a follow-up visit in one week to review the CT results.  Patient [name] with Dr. [name]. 25 minutes were spent face-to-face at today's visit with the majority of that time involved in counseling/coordination of care.    Ned Card ANP/GNP-BC   1Apr 05, 202017  11:54 AM This was a shared visit with Ned Card. [name] has persistent pain following the stereotactic radiation.she does not wish to consider a celiac block. She will continue hydrocodone for pain. We will follow-up on the CA 19-9 from today and she will undergo a restaging CT. She will return for an office visit after the CT to discuss systemic treatment options.  Julieanne Manson, M.D.

## 2016-08-21 ENCOUNTER — Telehealth: Payer: Self-pay | Admitting: *Deleted

## 2016-08-21 LAB — CANCER ANTIGEN 19-9: CAN 19-9: 226 U/mL — AB (ref 0–35)

## 2016-08-21 NOTE — Telephone Encounter (Signed)
-----   Message from Ladell Pier, MD sent at 08/21/2016  4:05 PM EST ----- Please call patient, ca19-9 is mildly elevated, proceed with CT and f/u as scheduled

## 2016-08-22 ENCOUNTER — Ambulatory Visit (HOSPITAL_COMMUNITY)
Admission: RE | Admit: 2016-08-22 | Discharge: 2016-08-22 | Disposition: A | Discharge status: Home / Self Care | Payer: Medicare Other | Source: Ambulatory Visit | Attending: Nurse Practitioner | Admitting: Nurse Practitioner

## 2016-08-22 DIAGNOSIS — C250 Malignant neoplasm of head of pancreas: Secondary | ICD-10-CM

## 2016-08-22 MED ORDER — IOPAMIDOL (ISOVUE-300) INJECTION 61%
100.0000 mL | Freq: Once | INTRAVENOUS | Status: AC | PRN
  Administered 2016-08-22: 100 mL via INTRAVENOUS

## 2016-08-22 NOTE — Telephone Encounter (Signed)
Return call received from patient.  This nurse provided Dr. Gearldine Shown information and instructions with this call.  Denies further needs or questions.

## 2016-08-27 ENCOUNTER — Telehealth: Payer: Self-pay | Admitting: *Deleted

## 2016-08-27 ENCOUNTER — Telehealth: Payer: Self-pay | Admitting: Nurse Practitioner

## 2016-08-27 ENCOUNTER — Ambulatory Visit (HOSPITAL_BASED_OUTPATIENT_CLINIC_OR_DEPARTMENT_OTHER): Payer: Medicare Other | Admitting: Nurse Practitioner

## 2016-08-27 VITALS — BP 106/43 | HR 50 | Temp 98.5°F | Resp 18 | Ht 63.0 in | Wt 174.9 lb

## 2016-08-27 DIAGNOSIS — C250 Malignant neoplasm of head of pancreas: Secondary | ICD-10-CM

## 2016-08-27 DIAGNOSIS — R197 Diarrhea, unspecified: Secondary | ICD-10-CM | POA: Diagnosis not present

## 2016-08-27 DIAGNOSIS — I4891 Unspecified atrial fibrillation: Secondary | ICD-10-CM | POA: Diagnosis not present

## 2016-08-27 DIAGNOSIS — Z8542 Personal history of malignant neoplasm of other parts of uterus: Secondary | ICD-10-CM | POA: Diagnosis not present

## 2016-08-27 NOTE — Telephone Encounter (Signed)
Appointments schedule per 12/5 LOS. Patient given AVS report and calendars with future scheduled appointments. °

## 2016-08-27 NOTE — Progress Notes (Signed)
Counselor met with patient, and patient's family, before patient's appointment today. Counselor was following up after a referral from [employer], [place]. Counselor spoke briefly with patient about her questions and fears surrounding her treatment. Counselor explained counseling services and left her contact information with the patient.   Rosana Fret, Counseling Mudlogger, Lorrin Jackson, Chaplain

## 2016-08-27 NOTE — Progress Notes (Signed)
Pt brought in copy of Living will, copy placed to be scanned in chart

## 2016-08-27 NOTE — Telephone Encounter (Signed)
Per LOS I have scheduled appts and notified the scheruler 

## 2016-08-27 NOTE — Progress Notes (Signed)
START ON PATHWAY REGIMEN - Pancreatic  PANOS55: FOLFIRINOX q14 Days x 6 Months   A cycle is every 14 days:     Oxaliplatin (Eloxatin(R)) 85 mg/m2 in 250 mL D5W IV over 2 hours day 1 Dose Mod: None     Leucovorin 400 mg/m2 in 250 mL D5W IV over 90 minutes day 1 to run concurrently with irinotecan Dose Mod: None     Irinotecan (Camptosar(R)) 180 mg/m2 in 500 mL D5W IV over 90 minutes day 1 Dose Mod: None     5-Fluorouracil 400 mg/m2 IV bolus over 2-4 minutes day 1 Dose Mod: None     5-Fluorouracil 2,400 mg/m2 in _____mL NS IV as a 46 hour infusion day 1 Dose Mod: None Additional Orders: Oxaliplatin incompatible with NS fluids. Flush line with D5W prior to administration.  **Always confirm dose/schedule in your pharmacy ordering system**    Patient Characteristics: Adenocarcinoma, Metastatic Disease, First Line, PS = 0, 1 Current evidence of distant metastases? Yes AJCC T Stage: X AJCC N Stage: X AJCC Stage Grouping: IV AJCC M Stage: X Histology: Adenocarcinoma Line of therapy: First Line Would you be surprised if this patient died  in the next year? I would NOT be surprised if this patient died in the next year  Intent of Therapy: Non-Curative / Palliative Intent, Discussed with Patient

## 2016-08-27 NOTE — Progress Notes (Addendum)
[place] OFFICE PROGRESS NOTE   Diagnosis:  Pancreas cancer  INTERVAL HISTORY:   [name] returns as scheduled. She continues to have abdominal pain. She is taking hydrocodone 3 times a day with good relief. She has intermittent nausea. Bowels are moving.  Objective:  Vital signs in last 24 hours:  Blood pressure (!) 106/43, pulse (!) 50, temperature 98.5 F (36.9 C), temperature source Oral, resp. rate 18, height 5\' 3"  (1.6 m), weight 174 lb 14.4 oz (79.3 kg), SpO2 99 %.    HEENT: No thrush or ulcers. Resp: Lungs clear bilaterally. Cardio: Regular rate and rhythm. GI: Abdomen is soft. Tender over the upper abdomen. No hepatomegaly. No mass. Vascular: No leg edema. Neuro: Vibratory sense mildly diminished over the fingertips bilaterally per tuning fork exam.  Port-A-Cath without erythema.   Lab Results:  Lab Results  Component Value Date   WBC 3.6 (L) 10/18/2015   HGB 11.0 (L) 10/18/2015   HCT 32.8 (L) 10/18/2015   MCV 88.6 10/18/2015   PLT 186 10/18/2015   NEUTROABS 1.7 10/18/2015    Imaging:  No results found.  Medications: I have reviewed the patient's current medications.  Assessment/Plan: 1. 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 Elevated CA 19-9 ? CT chest 08/04/2014-negative for metastatic disease ? Pancreaticoduodenectomy 08/30/2014, stage II (T3 N0) moderately differential adenocarcinoma, negative resection margins (1 mm retroperitoneal margin) ? Initiation of adjuvant gemcitabine 10/26/2014. ? Gemcitabine held 11/02/2014 due to neutropenia. ? Gemcitabine 11/09/2014 dose reduced 800 mg/m. ? Gemcitabine held 11/16/2014 due to neutropenia. ? Gemcitabine resumed 11/23/2014 every 2 week schedule. ? Cycle 6 gemcitabine 01/05/2015 ? Cycle 7 gemcitabine 01/18/2015 ? Cycle 8 gemcitabine 02/01/2015 ? Cycle 9 gemcitabine 02/15/2015 ? Cycle 10 gemcitabine  03/01/2015 ? Cycle 11 gemcitabine 03/15/2015 ? Cycle 12 gemcitabine 03/29/2015 ? Elevated CA 19-01 May 2016 ? CTs 06/17/2016-new soft tissue mass at the root of the mesentery with vascular involvement ? PET 06/27/2016-hypermetabolic activity associated with soft tissue adjacent to surgical clips in the central mesentery ? Status post SBRTto the mesenteric mass completed 07/26/2016 ? CT abdomen/pelvis 08/23/2016 - mesenteric mass stable to slightly decreased in size.  2. 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 Admission with post ERCP pancreatitis 06/16/2014  4. History of abdominal pain secondary to #1  5. Pulmonary embolism diagnosed on a CT of the abdomen 09/16/2014  Negative lower extremity Dopplers 09/17/2014  6. Multiple orthopedic surgical procedures  7. Endometrial cancer,stage IA, grade 1 endometrioid adenocarcinoma, 18% myometrial invasion, no lymphovascular space involvement, negative washings  Status post robotic total hysterectomy and bilateral salpingo-oophorectomy 11/30/2010  Recurrent tumor left lateral vagina status post biopsy 11/24/2014 with pathology confirming adenocarcinoma with focal squamous differentiation consistent with endometrial adenocarcinoma  Staging CT scans 12/06/2014 with no evidence of local pancreatic cancer recurrence. Small fluid collection adjacent to the left adrenal gland. Severe hepatic steatosis. No evidence of local extension of endometrial carcinoma. Carcinoma not well-defined at the vaginal cuff. 5 mm right external iliac lymph node. 3.6 mm left external iliac lymph node  Brachytherapy initiated 12/22/2014, completed 01/19/2015  CT abdomen/pelvis 07/24/2015 revealed a 3 x 4 cm soft tissue focus at the vaginal apex  PET scan 08/11/2015 revealed no mass at the vaginal apex  and no evidence of metastatic disease  8. History of atrial  fibrillation-maintained on xarelto  9. Family history of multiple cancers-negative CancerNext gene panel  10. Prolonged nausea following the pancreaticoduodenectomy. Improved 10/26/2014.  11. Port-A-Cath placement 10/21/2014.  12. History of Neutropenia secondary to chemotherapy   13. Diarrhea. Question pancreatic insufficiency. Pancreatic enzyme replacement initiated 01/05/2015. Recurrent diarrhea following a course of antibiotics March 2017.  14. History of positional vertigo-resolved  15. Pain-abdominaland back pain-likely secondary to the mesenteric mass   Disposition: [name] appears stable. The recent CT scan showed overall stability of the mesenteric mass. We reviewed the CT result with [name] and her family. Dr. [name] recommends a trial of FOLFOX chemotherapy, potentially FOLFIRINOX after 2 cycles, if FOLFOX well tolerated. We reviewed potential toxicities associated with chemotherapy including myelosuppression, nausea, hair loss, allergic reaction. We reviewed the neurotoxicity associated with oxaliplatin including cold sensitivity, peripheral neuropathy, acute laryngopharyngeal dysesthesia and more rare occurrences such as diplopia, ataxia, and incontinence. We discussed potential toxicities associated with 5-FU including mouth sores, diarrhea, hand-foot syndrome, skin hyperpigmentation, increased sensitivity to sun, conjunctivitis. We reviewed the potential for both acute and late phase diarrhea associated with irinotecan. She is agreeable to proceed as outlined above.  She will return for cycle 1 FOLFOX 09/04/2016. We will see her in follow-up prior to cycle 2 FOLFOX on 09/18/2016. She will contact the office in the interim with any problems.  Patient seen with Dr. [name]. 35 minutes were spent face-to-face at today's visit with the majority of that time involved in counseling/coordination of  care.    Ned Card ANP/GNP-BC   08/27/2016  3:28 PM  This was a shared visit with Ned Card. We reviewed the CT findings and treatment options with [name] and her family. Her pain has not improved following palliative radiation and the CA 19-9 is slightly higher. She understands no therapy will be curative. The plan is to begin palliative chemotherapy with FOLFOX. The chemotherapy will be escalated to FOLFIRINOX if she tolerates the first 2 cycles of FOLFOX without significant toxicity. We reviewed the potential toxicities of the FOLFOX regimen and she agrees to proceed.  Julieanne Manson, M.D.

## 2016-08-30 ENCOUNTER — Telehealth: Payer: Self-pay | Admitting: *Deleted

## 2016-08-30 NOTE — Telephone Encounter (Signed)
Message from pt reporting she is being treated for sinus infection. Starting Amoxil and wants to know if she can take this and start chemo next week as scheduled. Discussed with Dr. [name]: Yes, plan to push ahead with treatment. Pt notified, voiced understanding.

## 2016-09-01 ENCOUNTER — Other Ambulatory Visit: Payer: Self-pay | Admitting: Oncology

## 2016-09-03 ENCOUNTER — Encounter: Payer: Self-pay | Admitting: Oncology

## 2016-09-04 ENCOUNTER — Ambulatory Visit (HOSPITAL_BASED_OUTPATIENT_CLINIC_OR_DEPARTMENT_OTHER): Payer: Medicare Other

## 2016-09-04 ENCOUNTER — Other Ambulatory Visit (HOSPITAL_BASED_OUTPATIENT_CLINIC_OR_DEPARTMENT_OTHER): Payer: Medicare Other

## 2016-09-04 VITALS — BP 121/68 | HR 50 | Temp 97.7°F | Resp 18

## 2016-09-04 DIAGNOSIS — C250 Malignant neoplasm of head of pancreas: Secondary | ICD-10-CM

## 2016-09-04 DIAGNOSIS — Z452 Encounter for adjustment and management of vascular access device: Secondary | ICD-10-CM

## 2016-09-04 DIAGNOSIS — Z5111 Encounter for antineoplastic chemotherapy: Secondary | ICD-10-CM | POA: Diagnosis not present

## 2016-09-04 DIAGNOSIS — C541 Malignant neoplasm of endometrium: Secondary | ICD-10-CM

## 2016-09-04 DIAGNOSIS — Z95828 Presence of other vascular implants and grafts: Secondary | ICD-10-CM

## 2016-09-04 LAB — COMPREHENSIVE METABOLIC PANEL
ALBUMIN: 3.6 g/dL (ref 3.5–5.0)
ALK PHOS: 122 U/L (ref 40–150)
ALT: 31 U/L (ref 0–55)
AST: 39 U/L — AB (ref 5–34)
Anion Gap: 9 mEq/L (ref 3–11)
BUN: 14.6 mg/dL (ref 7.0–26.0)
CALCIUM: 9.1 mg/dL (ref 8.4–10.4)
CO2: 23 mEq/L (ref 22–29)
CREATININE: 0.7 mg/dL (ref 0.6–1.1)
Chloride: 106 mEq/L (ref 98–109)
EGFR: 88 mL/min/{1.73_m2} — ABNORMAL LOW (ref >90)
GLUCOSE: 109 mg/dL (ref 70–140)
POTASSIUM: 4 meq/L (ref 3.5–5.1)
Sodium: 137 mEq/L (ref 136–145)
TOTAL PROTEIN: 7 g/dL (ref 6.4–8.3)
Total Bilirubin: 0.62 mg/dL (ref 0.20–1.20)

## 2016-09-04 LAB — CBC WITH DIFFERENTIAL/PLATELET
BASO%: 0.9 % (ref 0.0–2.0)
BASOS ABS: 0 10*3/uL (ref 0.0–0.1)
EOS%: 3.6 % (ref 0.0–7.0)
Eosinophils Absolute: 0.1 10*3/uL (ref 0.0–0.5)
HCT: 32.8 % — ABNORMAL LOW (ref 34.8–46.6)
HGB: 10.9 g/dL — ABNORMAL LOW (ref 11.6–15.9)
LYMPH#: 1.1 10*3/uL (ref 0.9–3.3)
LYMPH%: 31.1 % (ref 14.0–49.7)
MCH: 29.5 pg (ref 25.1–34.0)
MCHC: 33.2 g/dL (ref 31.5–36.0)
MCV: 89 fL (ref 79.5–101.0)
MONO#: 0.4 10*3/uL (ref 0.1–0.9)
MONO%: 9.7 % (ref 0.0–14.0)
NEUT%: 54.7 % (ref 38.4–76.8)
NEUTROS ABS: 2 10*3/uL (ref 1.5–6.5)
Platelets: 187 10*3/uL (ref 145–400)
RBC: 3.69 10*6/uL — ABNORMAL LOW (ref 3.70–5.45)
RDW: 13.5 % (ref 11.2–14.5)
WBC: 3.7 10*3/uL — AB (ref 3.9–10.3)

## 2016-09-04 MED ORDER — FLUOROURACIL CHEMO INJECTION 2.5 GM/50ML
400.0000 mg/m2 | Freq: Once | INTRAVENOUS | Status: AC
  Administered 2016-09-04: 750 mg via INTRAVENOUS
  Filled 2016-09-04: qty 15

## 2016-09-04 MED ORDER — SODIUM CHLORIDE 0.9% FLUSH
10.0000 mL | INTRAVENOUS | Status: DC | PRN
  Filled 2016-09-04: qty 10

## 2016-09-04 MED ORDER — SODIUM CHLORIDE 0.9 % IV SOLN
2400.0000 mg/m2 | INTRAVENOUS | Status: DC
  Administered 2016-09-04: 4500 mg via INTRAVENOUS
  Filled 2016-09-04: qty 90

## 2016-09-04 MED ORDER — LEUCOVORIN CALCIUM INJECTION 350 MG
400.0000 mg/m2 | Freq: Once | INTRAVENOUS | Status: AC
  Administered 2016-09-04: 752 mg via INTRAVENOUS
  Filled 2016-09-04: qty 37.6

## 2016-09-04 MED ORDER — SODIUM CHLORIDE 0.9 % IJ SOLN
10.0000 mL | INTRAMUSCULAR | Status: AC | PRN
  Administered 2016-09-04: 10 mL via INTRAVENOUS
  Filled 2016-09-04: qty 10

## 2016-09-04 MED ORDER — DEXAMETHASONE SODIUM PHOSPHATE 10 MG/ML IJ SOLN
10.0000 mg | Freq: Once | INTRAMUSCULAR | Status: AC
  Administered 2016-09-04: 10 mg via INTRAVENOUS

## 2016-09-04 MED ORDER — DEXAMETHASONE SODIUM PHOSPHATE 10 MG/ML IJ SOLN
INTRAMUSCULAR | Status: AC
  Filled 2016-09-04: qty 1

## 2016-09-04 MED ORDER — PALONOSETRON HCL INJECTION 0.25 MG/5ML
INTRAVENOUS | Status: AC
  Filled 2016-09-04: qty 5

## 2016-09-04 MED ORDER — PALONOSETRON HCL INJECTION 0.25 MG/5ML
0.2500 mg | Freq: Once | INTRAVENOUS | Status: AC
  Administered 2016-09-04: 0.25 mg via INTRAVENOUS

## 2016-09-04 MED ORDER — DEXTROSE 5 % IV SOLN
Freq: Once | INTRAVENOUS | Status: AC
  Administered 2016-09-04: 09:00:00 via INTRAVENOUS

## 2016-09-04 MED ORDER — OXALIPLATIN CHEMO INJECTION 100 MG/20ML
85.0000 mg/m2 | Freq: Once | INTRAVENOUS | Status: AC
  Administered 2016-09-04: 160 mg via INTRAVENOUS
  Filled 2016-09-04: qty 32

## 2016-09-04 MED ORDER — HEPARIN SOD (PORK) LOCK FLUSH 100 UNIT/ML IV SOLN
500.0000 [IU] | Freq: Once | INTRAVENOUS | Status: DC | PRN
  Filled 2016-09-04: qty 5

## 2016-09-04 NOTE — Patient Instructions (Signed)

## 2016-09-04 NOTE — Patient Instructions (Addendum)
[facility] Discharge Instructions for Patients Receiving Chemotherapy  Today you received the following chemotherapy agents Oxaliplatin, Leucovorin, and Adrucil  To help prevent nausea and vomiting after your treatment, we encourage you to take your nausea medication as directed. No Zofran for 3 days.   If you develop nausea and vomiting that is not controlled by your nausea medication, call the clinic.   BELOW ARE SYMPTOMS THAT SHOULD BE REPORTED IMMEDIATELY:  *FEVER GREATER THAN 100.5 F  *CHILLS WITH OR WITHOUT FEVER  NAUSEA AND VOMITING THAT IS NOT CONTROLLED WITH YOUR NAUSEA MEDICATION  *UNUSUAL SHORTNESS OF BREATH  *UNUSUAL BRUISING OR BLEEDING  TENDERNESS IN MOUTH AND THROAT WITH OR WITHOUT PRESENCE OF ULCERS  *URINARY PROBLEMS  *BOWEL PROBLEMS  UNUSUAL RASH Items with * indicate a potential emergency and should be followed up as soon as possible.  Feel free to call the clinic you have any questions or concerns. The clinic phone number is [phone].  Please show the [place] at check-in to the Emergency Department and triage nurse.  Oxaliplatin Injection What is this medicine? OXALIPLATIN (ox AL i PLA tin) is a chemotherapy drug. It targets fast dividing cells, like cancer cells, and causes these cells to die. This medicine is used to treat cancers of the colon and rectum, and many other cancers. This medicine may be used for other purposes; ask your health care provider or pharmacist if you have questions. COMMON BRAND NAME(S): Eloxatin What should I tell my health care provider before I take this medicine? They need to know if you have any of these conditions: -kidney disease -an unusual or allergic reaction to oxaliplatin, other chemotherapy, other medicines, foods, dyes, or preservatives -pregnant or trying to get pregnant -breast-feeding How should I use this medicine? This drug is given as an infusion into a vein. It is  administered in a hospital or clinic by a specially trained health care professional. Talk to your pediatrician regarding the use of this medicine in children. Special care may be needed. Overdosage: If you think you have taken too much of this medicine contact a poison control center or emergency room at once. NOTE: This medicine is only for you. Do not share this medicine with others. What if I miss a dose? It is important not to miss a dose. Call your doctor or health care professional if you are unable to keep an appointment. What may interact with this medicine? -medicines to increase blood counts like filgrastim, pegfilgrastim, sargramostim -probenecid -some antibiotics like amikacin, gentamicin, neomycin, polymyxin B, streptomycin, tobramycin -zalcitabine Talk to your doctor or health care professional before taking any of these medicines: -acetaminophen -aspirin -ibuprofen -ketoprofen -naproxen This list may not describe all possible interactions. Give your health care provider a list of all the medicines, herbs, non-prescription drugs, or dietary supplements you use. Also tell them if you smoke, drink alcohol, or use illegal drugs. Some items may interact with your medicine. What should I watch for while using this medicine? Your condition will be monitored carefully while you are receiving this medicine. You will need important blood work done while you are taking this medicine. This medicine can make you more sensitive to cold. Do not drink cold drinks or use ice. Cover exposed skin before coming in contact with cold temperatures or cold objects. When out in cold weather wear warm clothing and cover your mouth and nose to warm the air that goes into your lungs. Tell your doctor if you get  sensitive to the cold. This drug may make you feel generally unwell. This is not uncommon, as chemotherapy can affect healthy cells as well as cancer cells. Report any side effects. Continue your  course of treatment even though you feel ill unless your doctor tells you to stop. In some cases, you may be given additional medicines to help with side effects. Follow all directions for their use. Call your doctor or health care professional for advice if you get a fever, chills or sore throat, or other symptoms of a cold or flu. Do not treat yourself. This drug decreases your body's ability to fight infections. Try to avoid being around people who are sick. This medicine may increase your risk to bruise or bleed. Call your doctor or health care professional if you notice any unusual bleeding. Be careful brushing and flossing your teeth or using a toothpick because you may get an infection or bleed more easily. If you have any dental work done, tell your dentist you are receiving this medicine. Avoid taking products that contain aspirin, acetaminophen, ibuprofen, naproxen, or ketoprofen unless instructed by your doctor. These medicines may hide a fever. Do not become pregnant while taking this medicine. Women should inform their doctor if they wish to become pregnant or think they might be pregnant. There is a potential for serious side effects to an unborn child. Talk to your health care professional or pharmacist for more information. Do not breast-feed an infant while taking this medicine. Call your doctor or health care professional if you get diarrhea. Do not treat yourself. What side effects may I notice from receiving this medicine? Side effects that you should report to your doctor or health care professional as soon as possible: -allergic reactions like skin rash, itching or hives, swelling of the face, lips, or tongue -low blood counts - This drug may decrease the number of white blood cells, red blood cells and platelets. You may be at increased risk for infections and bleeding. -signs of infection - fever or chills, cough, sore throat, pain or difficulty passing urine -signs of decreased  platelets or bleeding - bruising, pinpoint red spots on the skin, black, tarry stools, nosebleeds -signs of decreased red blood cells - unusually weak or tired, fainting spells, lightheadedness -breathing problems -chest pain, pressure -cough -diarrhea -jaw tightness -mouth sores -nausea and vomiting -pain, swelling, redness or irritation at the injection site -pain, tingling, numbness in the hands or feet -problems with balance, talking, walking -redness, blistering, peeling or loosening of the skin, including inside the mouth -trouble passing urine or change in the amount of urine Side effects that usually do not require medical attention (report to your doctor or health care professional if they continue or are bothersome): -changes in vision -constipation -hair loss -loss of appetite -metallic taste in the mouth or changes in taste -stomach pain This list may not describe all possible side effects. Call your doctor for medical advice about side effects. You may report side effects to FDA at 1-800-FDA-1088. Where should I keep my medicine? This drug is given in a hospital or clinic and will not be stored at home. NOTE: This sheet is a summary. It may not cover all possible information. If you have questions about this medicine, talk to your doctor, pharmacist, or health care provider.  2017 Elsevier/Gold Standard (2008-04-05 17:22:47)  Leucovorin injection What is this medicine? LEUCOVORIN (loo koe VOR in) is used to prevent or treat the harmful effects of some medicines. This medicine  is used to treat anemia caused by a low amount of folic acid in the body. It is also used with 5-fluorouracil (5-FU) to treat colon cancer. This medicine may be used for other purposes; ask your health care provider or pharmacist if you have questions. What should I tell my health care provider before I take this medicine? They need to know if you have any of these conditions: -anemia from low  levels of vitamin B-12 in the blood -an unusual or allergic reaction to leucovorin, folic acid, other medicines, foods, dyes, or preservatives -pregnant or trying to get pregnant -breast-feeding How should I use this medicine? This medicine is for injection into a muscle or into a vein. It is given by a health care professional in a hospital or clinic setting. Talk to your pediatrician regarding the use of this medicine in children. Special care may be needed. Overdosage: If you think you have taken too much of this medicine contact a poison control center or emergency room at once. NOTE: This medicine is only for you. Do not share this medicine with others. What if I miss a dose? This does not apply. What may interact with this medicine? -capecitabine -fluorouracil -phenobarbital -phenytoin -primidone -trimethoprim-sulfamethoxazole This list may not describe all possible interactions. Give your health care provider a list of all the medicines, herbs, non-prescription drugs, or dietary supplements you use. Also tell them if you smoke, drink alcohol, or use illegal drugs. Some items may interact with your medicine. What should I watch for while using this medicine? Your condition will be monitored carefully while you are receiving this medicine. This medicine may increase the side effects of 5-fluorouracil, 5-FU. Tell your doctor or health care professional if you have diarrhea or mouth sores that do not get better or that get worse. What side effects may I notice from receiving this medicine? Side effects that you should report to your doctor or health care professional as soon as possible: -allergic reactions like skin rash, itching or hives, swelling of the face, lips, or tongue -breathing problems -fever, infection -mouth sores -unusual bleeding or bruising -unusually weak or tired Side effects that usually do not require medical attention (report to your doctor or health care  professional if they continue or are bothersome): -constipation or diarrhea -loss of appetite -nausea, vomiting This list may not describe all possible side effects. Call your doctor for medical advice about side effects. You may report side effects to FDA at 1-800-FDA-1088. Where should I keep my medicine? This drug is given in a hospital or clinic and will not be stored at home. NOTE: This sheet is a summary. It may not cover all possible information. If you have questions about this medicine, talk to your doctor, pharmacist, or health care provider.  2017 Elsevier/Gold Standard (2008-03-15 16:50:29)  Fluorouracil, 5-FU injection What is this medicine? FLUOROURACIL, 5-FU (flure oh YOOR a sil) is a chemotherapy drug. It slows the growth of cancer cells. This medicine is used to treat many types of cancer like breast cancer, colon or rectal cancer, pancreatic cancer, and stomach cancer. This medicine may be used for other purposes; ask your health care provider or pharmacist if you have questions. COMMON BRAND NAME(S): Adrucil What should I tell my health care provider before I take this medicine? They need to know if you have any of these conditions: -blood disorders -dihydropyrimidine dehydrogenase (DPD) deficiency -infection (especially a virus infection such as chickenpox, cold sores, or herpes) -kidney disease -liver disease -malnourished,  poor nutrition -recent or ongoing radiation therapy -an unusual or allergic reaction to fluorouracil, other chemotherapy, other medicines, foods, dyes, or preservatives -pregnant or trying to get pregnant -breast-feeding How should I use this medicine? This drug is given as an infusion or injection into a vein. It is administered in a hospital or clinic by a specially trained health care professional. Talk to your pediatrician regarding the use of this medicine in children. Special care may be needed. Overdosage: If you think you have taken too  much of this medicine contact a poison control center or emergency room at once. NOTE: This medicine is only for you. Do not share this medicine with others. What if I miss a dose? It is important not to miss your dose. Call your doctor or health care professional if you are unable to keep an appointment. What may interact with this medicine? -allopurinol -cimetidine -dapsone -digoxin -hydroxyurea -leucovorin -levamisole -medicines for seizures like ethotoin, fosphenytoin, phenytoin -medicines to increase blood counts like filgrastim, pegfilgrastim, sargramostim -medicines that treat or prevent blood clots like warfarin, enoxaparin, and dalteparin -methotrexate -metronidazole -pyrimethamine -some other chemotherapy drugs like busulfan, cisplatin, estramustine, vinblastine -trimethoprim -trimetrexate -vaccines Talk to your doctor or health care professional before taking any of these medicines: -acetaminophen -aspirin -ibuprofen -ketoprofen -naproxen This list may not describe all possible interactions. Give your health care provider a list of all the medicines, herbs, non-prescription drugs, or dietary supplements you use. Also tell them if you smoke, drink alcohol, or use illegal drugs. Some items may interact with your medicine. What should I watch for while using this medicine? Visit your doctor for checks on your progress. This drug may make you feel generally unwell. This is not uncommon, as chemotherapy can affect healthy cells as well as cancer cells. Report any side effects. Continue your course of treatment even though you feel ill unless your doctor tells you to stop. In some cases, you may be given additional medicines to help with side effects. Follow all directions for their use. Call your doctor or health care professional for advice if you get a fever, chills or sore throat, or other symptoms of a cold or flu. Do not treat yourself. This drug decreases your body's  ability to fight infections. Try to avoid being around people who are sick. This medicine may increase your risk to bruise or bleed. Call your doctor or health care professional if you notice any unusual bleeding. Be careful brushing and flossing your teeth or using a toothpick because you may get an infection or bleed more easily. If you have any dental work done, tell your dentist you are receiving this medicine. Avoid taking products that contain aspirin, acetaminophen, ibuprofen, naproxen, or ketoprofen unless instructed by your doctor. These medicines may hide a fever. Do not become pregnant while taking this medicine. Women should inform their doctor if they wish to become pregnant or think they might be pregnant. There is a potential for serious side effects to an unborn child. Talk to your health care professional or pharmacist for more information. Do not breast-feed an infant while taking this medicine. Men should inform their doctor if they wish to father a child. This medicine may lower sperm counts. Do not treat diarrhea with over the counter products. Contact your doctor if you have diarrhea that lasts more than 2 days or if it is severe and watery. This medicine can make you more sensitive to the sun. Keep out of the sun. If you cannot avoid  being in the sun, wear protective clothing and use sunscreen. Do not use sun lamps or tanning beds/booths. What side effects may I notice from receiving this medicine? Side effects that you should report to your doctor or health care professional as soon as possible: -allergic reactions like skin rash, itching or hives, swelling of the face, lips, or tongue -low blood counts - this medicine may decrease the number of white blood cells, red blood cells and platelets. You may be at increased risk for infections and bleeding. -signs of infection - fever or chills, cough, sore throat, pain or difficulty passing urine -signs of decreased platelets or  bleeding - bruising, pinpoint red spots on the skin, black, tarry stools, blood in the urine -signs of decreased red blood cells - unusually weak or tired, fainting spells, lightheadedness -breathing problems -changes in vision -chest pain -mouth sores -nausea and vomiting -pain, swelling, redness at site where injected -pain, tingling, numbness in the hands or feet -redness, swelling, or sores on hands or feet -stomach pain -unusual bleeding Side effects that usually do not require medical attention (report to your doctor or health care professional if they continue or are bothersome): -changes in finger or toe nails -diarrhea -dry or itchy skin -hair loss -headache -loss of appetite -sensitivity of eyes to the light -stomach upset -unusually teary eyes This list may not describe all possible side effects. Call your doctor for medical advice about side effects. You may report side effects to FDA at 1-800-FDA-1088. Where should I keep my medicine? This drug is given in a hospital or clinic and will not be stored at home. NOTE: This sheet is a summary. It may not cover all possible information. If you have questions about this medicine, talk to your doctor, pharmacist, or health care provider.  2017 Elsevier/Gold Standard (2008-01-13 13:53:16)

## 2016-09-05 ENCOUNTER — Encounter: Payer: Self-pay | Admitting: Radiation Oncology

## 2016-09-05 ENCOUNTER — Encounter: Payer: Self-pay | Admitting: Oncology

## 2016-09-05 ENCOUNTER — Ambulatory Visit
Admission: RE | Admit: 2016-09-05 | Discharge: 2016-09-05 | Disposition: A | Discharge status: Home / Self Care | Payer: Medicare Other | Source: Ambulatory Visit | Attending: Radiation Oncology | Admitting: Radiation Oncology

## 2016-09-05 VITALS — BP 105/66 | HR 49 | Temp 98.0°F | Resp 18 | Ht 63.0 in | Wt 177.6 lb

## 2016-09-05 DIAGNOSIS — Z885 Allergy status to narcotic agent status: Secondary | ICD-10-CM | POA: Insufficient documentation

## 2016-09-05 DIAGNOSIS — C541 Malignant neoplasm of endometrium: Secondary | ICD-10-CM | POA: Diagnosis not present

## 2016-09-05 DIAGNOSIS — Z923 Personal history of irradiation: Secondary | ICD-10-CM | POA: Insufficient documentation

## 2016-09-05 DIAGNOSIS — Z7901 Long term (current) use of anticoagulants: Secondary | ICD-10-CM | POA: Diagnosis not present

## 2016-09-05 DIAGNOSIS — Z9221 Personal history of antineoplastic chemotherapy: Secondary | ICD-10-CM | POA: Diagnosis not present

## 2016-09-05 DIAGNOSIS — C250 Malignant neoplasm of head of pancreas: Secondary | ICD-10-CM | POA: Diagnosis present

## 2016-09-05 DIAGNOSIS — Z888 Allergy status to other drugs, medicaments and biological substances status: Secondary | ICD-10-CM | POA: Diagnosis not present

## 2016-09-05 DIAGNOSIS — Z882 Allergy status to sulfonamides status: Secondary | ICD-10-CM | POA: Diagnosis not present

## 2016-09-05 LAB — CANCER ANTIGEN 19-9: CA 19-9: 158 U/mL — ABNORMAL HIGH (ref 0–35)

## 2016-09-05 NOTE — Progress Notes (Signed)
Radiation Oncology         [phone] ________________________________  Name: [name] MRN: [id number]  Date: 09/05/2016  DOB: 1946-07-17  Follow-Up Visit Note  CC: Chesley Noon, MD  Nancy Marus, MD    ICD-9-CM ICD-10-CM   1. Cancer of head of pancreas-S/P Whipple 08/30/14 157.0 C25.0   2. 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 She denies nausea and pain at this time. In the event of pain, the patient takes Hydrocodone with relief. She reports a good appetite and mild fatigue. Per nursing, skin to abdomen is normal in color. Of note, patient will have a port-a-cath pump removed tomorrow.                          ALLERGIES:  is allergic to ace inhibitors; codeine; scopolamine; and sulfa antibiotics.  Meds: Current Outpatient Prescriptions  Medication Sig Dispense Refill  . acetaminophen (TYLENOL) 500 MG tablet Take 1,000 mg by mouth every 6 (six) hours as needed for pain.     Marland Kitchen b complex vitamins tablet Take 1 tablet by mouth daily.    . flecainide (TAMBOCOR) 100 MG tablet TAKE 1 TABLET (100 MG TOTAL) BY MOUTH 2 (TWO) TIMES DAILY. 60 tablet 5  . fluticasone (FLONASE) 50 MCG/ACT nasal spray Place 1 spray into both nostrils 2 (two) times daily as needed.     Marland Kitchen guaiFENesin (MUCINEX) 600 MG 12 hr tablet Take by mouth every morning.    Marland Kitchen HYDROcodone-acetaminophen (NORCO/VICODIN) 5-325 MG tablet Take 1-2 tablets by mouth every 4 (four) hours as needed for moderate pain. 60 tablet 0  . lidocaine-prilocaine (EMLA) cream Apply small amount over port area 1-2 hours prior to treatment and cover with plastic wrap.  DO NOT RUB IN. 30 g prn  .  lipase/protease/amylase (CREON) 12000 UNITS CPEP capsule Take 2 capsules (24,000 Units total) by mouth 3 (three) times daily before meals. 180 capsule 2  . LORazepam (ATIVAN) 1 MG tablet Take 0.5 tablets (0.5 mg total) by mouth every 8 (eight) hours as needed for anxiety. 45 tablet 0  . losartan (COZAAR) 50 MG tablet TAKE 1 TABLET BY MOUTH EVERY DAY 30 tablet 8  . metoprolol succinate (TOPROL-XL) 25 MG 24 hr tablet Take 0.5 tablets (12.5 mg total) by mouth daily. Take 1/2 tablet by mouth daily 45 tablet 3  . Multiple Vitamins-Minerals (CENTRUM SILVER PO) Take 1 tablet by mouth every morning.     . promethazine (PHENERGAN) 12.5 MG tablet Take 1 tablet (12.5 mg total) by mouth every 4 (four) hours as needed for nausea or vomiting. 30 tablet 1  . vitamin C (ASCORBIC ACID) 500 MG tablet Take 500 mg by mouth every morning.     [name] 20 MG TABS tablet TAKE 1 TABLET (20 MG TOTAL) BY MOUTH DAILY. 90 tablet 1  . zolpidem (AMBIEN CR) 6.25 MG CR tablet      No current facility-administered medications for this encounter.    Facility-Administered Medications Ordered in Other Encounters  Medication Dose Route Frequency Provider Last Rate Last Dose  . sodium chloride 0.9 % bolus 1,000 mL  1,000 mL Intravenous Once Nathaneil Canary  Ninfa Linden, MD      . sodium chloride 0.9 % injection 10 mL  10 mL Intravenous PRN Ladell Pier, MD   10 mL at 09/04/16 0757    Physical Findings: The patient is in no acute distress. Patient is alert and oriented.  height is 5\' 3"  (1.6 m) and weight is 177 lb 9.6 oz (80.6 kg). Her oral temperature is 98 F (36.7 C). Her blood pressure is 105/66 and her pulse is 49 (abnormal). Her respiration is 18 and oxygen saturation is 94%. .  No significant changes. Lungs are clear to auscultation bilaterally. Heart has regular rate and rhythm. No palpable cervical, supraclavicular, or axillary adenopathy. Abdomen soft, non-tender, normal bowel sounds.   Lab Findings: Lab Results  Component  Value Date   WBC 3.7 (L) 09/04/2016   HGB 10.9 (L) 09/04/2016   HCT 32.8 (L) 09/04/2016   MCV 89.0 09/04/2016   PLT 187 09/04/2016    Radiographic Findings: Ct Abdomen Pelvis W Contrast  Result Date: 08/23/2016 CLINICAL DATA:  Followup pancreas carcinoma and endometrial carcinoma. EXAM: CT ABDOMEN AND PELVIS WITH CONTRAST TECHNIQUE: Multidetector CT imaging of the abdomen and pelvis was performed using the standard protocol following bolus administration of intravenous contrast. CONTRAST:  140mL ISOVUE-300 IOPAMIDOL (ISOVUE-300) INJECTION 61% COMPARISON:  06/17/2016 FINDINGS: Lower chest: No acute abnormality. Hepatobiliary: Diffuse hepatic steatosis identified. No enhancing liver abnormality. Pancreas: Postoperative changes from Whipple procedure identified. The index soft tissue within the root of mesenteric compatible with residual/recurrent tumor measures 3.3 x 2.7 x 3.6 cm, image 31 of series 4. Previously this measured 3.9 x 3.1 x 3.6 cm. Spleen: Normal in size without focal abnormality. Adrenals/Urinary Tract: Normal appearance of the adrenal glands. The kidneys are unremarkable. No mass or hydronephrosis. The urinary bladder is normal. Stomach/Bowel: Postoperative appearance of the upper GI tract compatible with previous Whipple procedure. No pathologic dilatation of the small or large bowel loops. Vascular/Lymphatic: The abdominal aorta has a normal caliber. No significant retroperitoneal adenopathy. No pelvic or inguinal adenopathy. Reproductive: Status post hysterectomy. No adnexal masses. Other: There is no ascites or focal fluid collections within the abdomen or pelvis. No peritoneal nodularity or mass identified. Musculoskeletal: No aggressive lytic or sclerotic bone lesion identified. IMPRESSION: 1. The hypermetabolic, abnormal soft tissue within the root of the small bowel mesenteric is again noted and remains worrisome for recurrent pancreatic carcinoma. When compared with the previous  exam this is stable to slightly decreased in size in the interval. 2. No new or progressive disease identified. Electronically Signed   By: Kerby Moors M.D.   On: 08/23/2016 09:48    Impression:  The patient is recovering from the effects of radiation. Patient seems to have improved pain; however she is still taking hydrocodone every 4 hours. The patient's most recent CA19-9 has decreased (226 to 158 U/ml)). This decrease occurred prior to her starting 5FU chemotherapy. Recent CT scan of the abdomen showed mild shrinkage of the soft tissue mass recently treated with SBRT, this scan was however performed soon after completion of the patient's radiation therapy so full effect of radiation therapy was likely not appreciated.  Plan: Follow up in radiation oncology in 3 months. Patient will likely need a pelvic exam at this time to follow up on her endometrial cancer. Patient will continue on chemotherapy.   ____________________________________    This document serves as a record of services personally performed by Gery Pray, MD. It was created on his behalf by Bethann Humble, a trained  medical scribe. The creation of this record is based on the scribe's personal observations and the provider's statements to them. This document has been checked and approved by the attending provider.

## 2016-09-05 NOTE — Progress Notes (Signed)
Mrs, Zenaida Korbel is here for a one month follow up appointment for S/P SBRT to pancreas pancreatic cancer.  Skin to abdomen with normal color.  Appetite is real good.  Denies nausea and pain, when she has pain takes Hydrocodone with relief.  Having mild fatigue.  Will have port-a-cath pump removed tomorrow. Wt Readings from Last 3 Encounters:  09/05/16 177 lb 9.6 oz (80.6 kg)  08/27/16 174 lb 14.4 oz (79.3 kg)  08/20/16 175 lb 8 oz (79.6 kg)  BP 105/66   Pulse (!) 49   Temp 98 F (36.7 C) (Oral)   Resp 18   Ht 5\' 3"  (1.6 m)   Wt 177 lb 9.6 oz (80.6 kg)   SpO2 94%   BMI 31.46 kg/m

## 2016-09-06 ENCOUNTER — Ambulatory Visit (HOSPITAL_BASED_OUTPATIENT_CLINIC_OR_DEPARTMENT_OTHER): Payer: Medicare Other

## 2016-09-06 VITALS — BP 110/68 | HR 43 | Temp 97.8°F | Resp 18

## 2016-09-06 DIAGNOSIS — Z452 Encounter for adjustment and management of vascular access device: Secondary | ICD-10-CM

## 2016-09-06 DIAGNOSIS — C250 Malignant neoplasm of head of pancreas: Secondary | ICD-10-CM | POA: Diagnosis not present

## 2016-09-06 MED ORDER — HEPARIN SOD (PORK) LOCK FLUSH 100 UNIT/ML IV SOLN
500.0000 [IU] | Freq: Once | INTRAVENOUS | Status: AC | PRN
  Administered 2016-09-06: 500 [IU]
  Filled 2016-09-06: qty 5

## 2016-09-06 MED ORDER — SODIUM CHLORIDE 0.9% FLUSH
10.0000 mL | INTRAVENOUS | Status: DC | PRN
  Administered 2016-09-06: 10 mL
  Filled 2016-09-06: qty 10

## 2016-09-06 NOTE — Patient Instructions (Signed)

## 2016-09-11 ENCOUNTER — Encounter: Payer: Self-pay | Admitting: Oncology

## 2016-09-12 ENCOUNTER — Telehealth: Payer: Self-pay | Admitting: *Deleted

## 2016-09-12 NOTE — Telephone Encounter (Signed)
Call placed to check on patient.  Patient states that she is feeling "much better" today and that the diarrhea has stopped.  She states that she had approximately ten loose stools yesterday and did not take any anti-diarrheal medication.  She has drank 8 oz of water today per pt and is slightly lightheaded.  Patient instructed to increase fluid intake to at least 64 oz a day, to obtain Imodium to have on hand and to notify [facility] tomorrow if she does not continue to feel better.  Patient verbalizes an understanding of all instructions and is appreciative of call.

## 2016-09-16 ENCOUNTER — Other Ambulatory Visit: Payer: Self-pay | Admitting: Oncology

## 2016-09-18 ENCOUNTER — Ambulatory Visit (HOSPITAL_BASED_OUTPATIENT_CLINIC_OR_DEPARTMENT_OTHER): Payer: Medicare Other | Admitting: Nurse Practitioner

## 2016-09-18 ENCOUNTER — Ambulatory Visit (HOSPITAL_BASED_OUTPATIENT_CLINIC_OR_DEPARTMENT_OTHER): Payer: Medicare Other

## 2016-09-18 ENCOUNTER — Other Ambulatory Visit (HOSPITAL_BASED_OUTPATIENT_CLINIC_OR_DEPARTMENT_OTHER): Payer: Medicare Other

## 2016-09-18 ENCOUNTER — Ambulatory Visit: Payer: Medicare Other

## 2016-09-18 VITALS — BP 114/69 | HR 52 | Temp 98.2°F | Resp 16 | Ht 63.0 in | Wt 175.5 lb

## 2016-09-18 DIAGNOSIS — C541 Malignant neoplasm of endometrium: Secondary | ICD-10-CM

## 2016-09-18 DIAGNOSIS — D701 Agranulocytosis secondary to cancer chemotherapy: Secondary | ICD-10-CM | POA: Diagnosis not present

## 2016-09-18 DIAGNOSIS — Z95828 Presence of other vascular implants and grafts: Secondary | ICD-10-CM

## 2016-09-18 DIAGNOSIS — C250 Malignant neoplasm of head of pancreas: Secondary | ICD-10-CM

## 2016-09-18 DIAGNOSIS — Z452 Encounter for adjustment and management of vascular access device: Secondary | ICD-10-CM | POA: Diagnosis not present

## 2016-09-18 LAB — CBC WITH DIFFERENTIAL/PLATELET
BASO%: 0.4 % (ref 0.0–2.0)
BASOS ABS: 0 10*3/uL (ref 0.0–0.1)
EOS ABS: 0.1 10*3/uL (ref 0.0–0.5)
EOS%: 3.9 % (ref 0.0–7.0)
HEMATOCRIT: 31 % — AB (ref 34.8–46.6)
HEMOGLOBIN: 10.7 g/dL — AB (ref 11.6–15.9)
LYMPH#: 1.2 10*3/uL (ref 0.9–3.3)
LYMPH%: 47.3 % (ref 14.0–49.7)
MCH: 30.3 pg (ref 25.1–34.0)
MCHC: 34.5 g/dL (ref 31.5–36.0)
MCV: 87.8 fL (ref 79.5–101.0)
MONO#: 0.4 10*3/uL (ref 0.1–0.9)
MONO%: 15.2 % — AB (ref 0.0–14.0)
NEUT%: 33.2 % — ABNORMAL LOW (ref 38.4–76.8)
NEUTROS ABS: 0.9 10*3/uL — AB (ref 1.5–6.5)
PLATELETS: 155 10*3/uL (ref 145–400)
RBC: 3.53 10*6/uL — ABNORMAL LOW (ref 3.70–5.45)
RDW: 13.9 % (ref 11.2–14.5)
WBC: 2.6 10*3/uL — AB (ref 3.9–10.3)

## 2016-09-18 LAB — COMPREHENSIVE METABOLIC PANEL
ALBUMIN: 3.6 g/dL (ref 3.5–5.0)
ALK PHOS: 123 U/L (ref 40–150)
ALT: 33 U/L (ref 0–55)
ANION GAP: 8 meq/L (ref 3–11)
AST: 39 U/L — ABNORMAL HIGH (ref 5–34)
BILIRUBIN TOTAL: 0.7 mg/dL (ref 0.20–1.20)
BUN: 11.9 mg/dL (ref 7.0–26.0)
CALCIUM: 8.8 mg/dL (ref 8.4–10.4)
CO2: 24 mEq/L (ref 22–29)
Chloride: 106 mEq/L (ref 98–109)
Creatinine: 0.8 mg/dL (ref 0.6–1.1)
EGFR: 80 mL/min/{1.73_m2} — ABNORMAL LOW (ref >90)
GLUCOSE: 147 mg/dL — AB (ref 70–140)
POTASSIUM: 4 meq/L (ref 3.5–5.1)
Sodium: 137 mEq/L (ref 136–145)
TOTAL PROTEIN: 6.9 g/dL (ref 6.4–8.3)

## 2016-09-18 MED ORDER — SODIUM CHLORIDE 0.9 % IJ SOLN
10.0000 mL | INTRAMUSCULAR | Status: DC | PRN
  Administered 2016-09-18: 10 mL via INTRAVENOUS
  Filled 2016-09-18: qty 10

## 2016-09-18 MED ORDER — HEPARIN SOD (PORK) LOCK FLUSH 100 UNIT/ML IV SOLN
500.0000 [IU] | Freq: Once | INTRAVENOUS | Status: AC | PRN
  Administered 2016-09-18: 500 [IU] via INTRAVENOUS
  Filled 2016-09-18: qty 5

## 2016-09-18 NOTE — Progress Notes (Signed)
[name]   Diagnosis:  Pancreas cancer  INTERVAL HISTORY:   [name] returns as scheduled. She completed cycle 1 FOLFOX 09/04/2016. She had nausea beginning on the day of pump discontinuation. The nausea lasted for several days. She took Phenergan as needed with good results. She developed a tongue blister. She had diarrhea beginning 1 week after treatment. The diarrhea lasted for several days and resolved without intervention. Cold sensitivity lasted 5-6 days. She has mild persistent numbness in the fingertips. She notes abdominal pain is better. No fever or chills. She overall began to feel as if she was recovering from the chemotherapy 2 days ago.  Objective:  Vital signs in last 24 hours:  Blood pressure 114/69, pulse (!) 52, temperature 98.2 F (36.8 C), temperature source Oral, resp. rate 16, height 5\' 3"  (1.6 m), weight 175 lb 8 oz (79.6 kg), SpO2 98 %.    HEENT: No thrush or ulcers. Resp: Lungs clear bilaterally. Cardio: Regular rate and rhythm. GI: Abdomen soft and nontender. No hepatomegaly. No mass. Vascular: No leg edema. Neuro: Vibratory sense mildly decreased over the fingertips per tuning fork exam.  Skin: No rash. Port-A-Cath without erythema.    Lab Results:  Lab Results  Component Value Date   WBC 2.6 (L) 09/18/2016   HGB 10.7 (L) 09/18/2016   HCT 31.0 (L) 09/18/2016   MCV 87.8 09/18/2016   PLT 155 09/18/2016   NEUTROABS 0.9 (L) 09/18/2016    Imaging:  No results found.  Medications: I have reviewed the patient's current medications.  Assessment/Plan: 1. 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 Elevated CA 19-9 ? CT chest 08/04/2014-negative for metastatic disease ? Pancreaticoduodenectomy 08/30/2014, stage II (T3 N0) moderately differential adenocarcinoma, negative resection margins (1 mm retroperitoneal margin) ? Initiation of adjuvant  gemcitabine 10/26/2014. ? Gemcitabine held 11/02/2014 due to neutropenia. ? Gemcitabine 11/09/2014 dose reduced 800 mg/m. ? Gemcitabine held 11/16/2014 due to neutropenia. ? Gemcitabine resumed 11/23/2014 every 2 week schedule. ? Cycle 6 gemcitabine 01/05/2015 ? Cycle 7 gemcitabine 01/18/2015 ? Cycle 8 gemcitabine 02/01/2015 ? Cycle 9 gemcitabine 02/15/2015 ? Cycle 10 gemcitabine 03/01/2015 ? Cycle 11 gemcitabine 03/15/2015 ? Cycle 12 gemcitabine 03/29/2015 ? Elevated CA 19-01 May 2016 ? CTs 06/17/2016-new soft tissue mass at the root of the mesentery with vascular involvement ? PET 06/27/2016-hypermetabolic activity associated with soft tissue adjacent to surgical clips in the central mesentery ? Status post SBRTto the mesenteric mass completed 07/26/2016 ? CT abdomen/pelvis 08/23/2016 - mesenteric mass stable to slightly decreased in size. ? Cycle 1 FOLFOX 09/03/2016  2. 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 Admission with post ERCP pancreatitis 06/16/2014  4. History of abdominal pain secondary to #1  5. Pulmonary embolism diagnosed on a CT of the abdomen 09/16/2014  Negative lower extremity Dopplers 09/17/2014  6. Multiple orthopedic surgical procedures  7. Endometrial cancer,stage IA, grade 1 endometrioid adenocarcinoma, 18% myometrial invasion, no lymphovascular space involvement, negative washings  Status post robotic total hysterectomy and bilateral salpingo-oophorectomy 11/30/2010  Recurrent tumor left lateral vagina status post biopsy 11/24/2014 with pathology confirming adenocarcinoma with focal squamous differentiation consistent with endometrial adenocarcinoma  Staging CT scans 12/06/2014 with no evidence of local pancreatic cancer recurrence. Small fluid collection adjacent to the left adrenal  gland. Severe hepatic steatosis. No evidence  of local extension of endometrial carcinoma. Carcinoma not well-defined at the vaginal cuff. 5 mm right external iliac lymph node. 3.6 mm left external iliac lymph node  Brachytherapy initiated 12/22/2014, completed 01/19/2015  CT abdomen/pelvis 07/24/2015 revealed a 3 x 4 cm soft tissue focus at the vaginal apex  PET scan 08/11/2015 revealed no mass at the vaginal apex and no evidence of metastatic disease  Cycle 1 FOLFOX 09/04/2016  8. History of atrial fibrillation-maintained on xarelto  9. Family history of multiple cancers-negative CancerNext gene panel  10. Prolonged nausea following the pancreaticoduodenectomy. Improved 10/26/2014.  11. Port-A-Cath placement 10/21/2014.  12. History of Neutropenia secondary to chemotherapy   13. Diarrhea. Question pancreatic insufficiency. Pancreatic enzyme replacement initiated 01/05/2015. Recurrent diarrhea following a course of antibiotics March 2017.  14. History of positional vertigo-resolved  15. Pain-abdominaland back pain-likely secondary to the mesenteric mass  16. Neutropenia secondary to chemotherapy 09/18/2016.   Disposition: [name] appears stable. She has completed 1 cycle of FOLFOX. Postchemotherapy course was complicated by delayed nausea and diarrhea. She is neutropenic on labs today. We discussed holding today's chemotherapy and rescheduling to next week versus proceeding with treatment today and holding the oxaliplatin. We mutually decided to hold today's treatment and reschedule for next week as she only began feeling better a few days ago.  Emend will be added to the premedication regimen with cycle 2 FOLFOX. She understands to begin Imodium if she develops diarrhea following cycle 2.  Neutropenic precautions were reviewed. She understands to contact the office with fever, chills, other signs of infection. She will receive Neulasta on the day of pump  discontinuation. Potential toxicities associated with Neulasta were reviewed including bone pain, rash, splenic rupture. She is agreeable with the plan as outlined above.  She will return for a follow-up visit and cycle 2 FOLFOX on 09/27/2015. She will contact the office in the interim as outlined above or with any other problems.  25 minutes were spent face-to-face at today's visit with the majority of that time involved in counseling/coordination of care.    Ned Card ANP/GNP-BC   09/18/2016  11:11 AM

## 2016-09-18 NOTE — Patient Instructions (Signed)

## 2016-09-26 ENCOUNTER — Other Ambulatory Visit: Payer: Medicare Other

## 2016-09-26 ENCOUNTER — Ambulatory Visit: Payer: Medicare Other | Admitting: Nurse Practitioner

## 2016-09-28 ENCOUNTER — Ambulatory Visit: Payer: Medicare Other

## 2016-09-29 ENCOUNTER — Other Ambulatory Visit: Payer: Self-pay | Admitting: Oncology

## 2016-09-30 ENCOUNTER — Ambulatory Visit (HOSPITAL_BASED_OUTPATIENT_CLINIC_OR_DEPARTMENT_OTHER): Payer: Medicare Other

## 2016-09-30 ENCOUNTER — Telehealth: Payer: Self-pay | Admitting: Nurse Practitioner

## 2016-09-30 ENCOUNTER — Ambulatory Visit (HOSPITAL_BASED_OUTPATIENT_CLINIC_OR_DEPARTMENT_OTHER): Payer: Medicare Other | Admitting: Nurse Practitioner

## 2016-09-30 ENCOUNTER — Other Ambulatory Visit (HOSPITAL_BASED_OUTPATIENT_CLINIC_OR_DEPARTMENT_OTHER): Payer: Medicare Other

## 2016-09-30 VITALS — BP 115/67 | HR 64 | Temp 97.6°F | Resp 18 | Wt 179.5 lb

## 2016-09-30 DIAGNOSIS — Z452 Encounter for adjustment and management of vascular access device: Secondary | ICD-10-CM

## 2016-09-30 DIAGNOSIS — C541 Malignant neoplasm of endometrium: Secondary | ICD-10-CM

## 2016-09-30 DIAGNOSIS — R110 Nausea: Secondary | ICD-10-CM

## 2016-09-30 DIAGNOSIS — I4891 Unspecified atrial fibrillation: Secondary | ICD-10-CM | POA: Diagnosis not present

## 2016-09-30 DIAGNOSIS — C250 Malignant neoplasm of head of pancreas: Secondary | ICD-10-CM | POA: Diagnosis not present

## 2016-09-30 DIAGNOSIS — Z5111 Encounter for antineoplastic chemotherapy: Secondary | ICD-10-CM | POA: Diagnosis not present

## 2016-09-30 DIAGNOSIS — R197 Diarrhea, unspecified: Secondary | ICD-10-CM

## 2016-09-30 DIAGNOSIS — D701 Agranulocytosis secondary to cancer chemotherapy: Secondary | ICD-10-CM | POA: Diagnosis not present

## 2016-09-30 DIAGNOSIS — Z95828 Presence of other vascular implants and grafts: Secondary | ICD-10-CM

## 2016-09-30 LAB — CBC WITH DIFFERENTIAL/PLATELET
BASO%: 0.5 % (ref 0.0–2.0)
BASOS ABS: 0 10*3/uL (ref 0.0–0.1)
EOS%: 3.4 % (ref 0.0–7.0)
Eosinophils Absolute: 0.1 10*3/uL (ref 0.0–0.5)
HEMATOCRIT: 31.6 % — AB (ref 34.8–46.6)
HGB: 10.7 g/dL — ABNORMAL LOW (ref 11.6–15.9)
LYMPH#: 1.5 10*3/uL (ref 0.9–3.3)
LYMPH%: 36.7 % (ref 14.0–49.7)
MCH: 30.1 pg (ref 25.1–34.0)
MCHC: 33.9 g/dL (ref 31.5–36.0)
MCV: 88.8 fL (ref 79.5–101.0)
MONO#: 0.4 10*3/uL (ref 0.1–0.9)
MONO%: 10 % (ref 0.0–14.0)
NEUT#: 2 10*3/uL (ref 1.5–6.5)
NEUT%: 49.4 % (ref 38.4–76.8)
PLATELETS: 174 10*3/uL (ref 145–400)
RBC: 3.56 10*6/uL — ABNORMAL LOW (ref 3.70–5.45)
RDW: 14.2 % (ref 11.2–14.5)
WBC: 4.1 10*3/uL (ref 3.9–10.3)

## 2016-09-30 LAB — COMPREHENSIVE METABOLIC PANEL
ALT: 34 U/L (ref 0–55)
ANION GAP: 8 meq/L (ref 3–11)
AST: 47 U/L — ABNORMAL HIGH (ref 5–34)
Albumin: 3.8 g/dL (ref 3.5–5.0)
Alkaline Phosphatase: 132 U/L (ref 40–150)
BUN: 11.2 mg/dL (ref 7.0–26.0)
CHLORIDE: 106 meq/L (ref 98–109)
CO2: 23 mEq/L (ref 22–29)
Calcium: 8.8 mg/dL (ref 8.4–10.4)
Creatinine: 0.7 mg/dL (ref 0.6–1.1)
EGFR: 84 mL/min/{1.73_m2} — ABNORMAL LOW (ref >90)
Glucose: 172 mg/dl — ABNORMAL HIGH (ref 70–140)
POTASSIUM: 3.7 meq/L (ref 3.5–5.1)
Sodium: 138 mEq/L (ref 136–145)
Total Bilirubin: 0.43 mg/dL (ref 0.20–1.20)
Total Protein: 6.6 g/dL (ref 6.4–8.3)

## 2016-09-30 MED ORDER — ATROPINE SULFATE 1 MG/ML IJ SOLN: 0.5000 mg | Freq: Once | INTRAMUSCULAR | Status: DC | PRN

## 2016-09-30 MED ORDER — OXALIPLATIN CHEMO INJECTION 100 MG/20ML
85.0000 mg/m2 | Freq: Once | INTRAVENOUS | Status: AC
  Administered 2016-09-30: 160 mg via INTRAVENOUS
  Filled 2016-09-30: qty 32

## 2016-09-30 MED ORDER — PALONOSETRON HCL INJECTION 0.25 MG/5ML
INTRAVENOUS | Status: AC
  Filled 2016-09-30: qty 5

## 2016-09-30 MED ORDER — PALONOSETRON HCL INJECTION 0.25 MG/5ML
0.2500 mg | Freq: Once | INTRAVENOUS | Status: AC
  Administered 2016-09-30: 0.25 mg via INTRAVENOUS

## 2016-09-30 MED ORDER — PROMETHAZINE HCL 12.5 MG PO TABS: 12.5000 mg | ORAL_TABLET | Freq: Four times a day (QID) | ORAL | 1 refills | Status: DC | PRN

## 2016-09-30 MED ORDER — SODIUM CHLORIDE 0.9 % IV SOLN
Freq: Once | INTRAVENOUS | Status: AC
  Administered 2016-09-30: 12:00:00 via INTRAVENOUS
  Filled 2016-09-30: qty 5

## 2016-09-30 MED ORDER — DEXTROSE 5 % IV SOLN
400.0000 mg/m2 | Freq: Once | INTRAVENOUS | Status: AC
  Administered 2016-09-30: 752 mg via INTRAVENOUS
  Filled 2016-09-30: qty 37.6

## 2016-09-30 MED ORDER — FLUOROURACIL CHEMO INJECTION 2.5 GM/50ML
400.0000 mg/m2 | Freq: Once | INTRAVENOUS | Status: AC
  Administered 2016-09-30: 750 mg via INTRAVENOUS
  Filled 2016-09-30: qty 15

## 2016-09-30 MED ORDER — SODIUM CHLORIDE 0.9 % IJ SOLN
10.0000 mL | INTRAMUSCULAR | Status: DC | PRN
  Administered 2016-09-30: 10 mL via INTRAVENOUS
  Filled 2016-09-30: qty 10

## 2016-09-30 MED ORDER — SODIUM CHLORIDE 0.9 % IV SOLN
2400.0000 mg/m2 | INTRAVENOUS | Status: DC
  Administered 2016-09-30: 4500 mg via INTRAVENOUS
  Filled 2016-09-30: qty 90

## 2016-09-30 MED ORDER — DEXTROSE 5 % IV SOLN
Freq: Once | INTRAVENOUS | Status: AC
  Administered 2016-09-30: 12:00:00 via INTRAVENOUS

## 2016-09-30 NOTE — Patient Instructions (Addendum)
[facility] Discharge Instructions for Patients Receiving Chemotherapy  Today you received the following chemotherapy agents Oxaliplatin, Leucovorin and Adrucil.  To help prevent nausea and vomiting after your treatment, we encourage you to take your nausea medication as prescribed.   If you develop nausea and vomiting that is not controlled by your nausea medication, call the clinic.   BELOW ARE SYMPTOMS THAT SHOULD BE REPORTED IMMEDIATELY:  *FEVER GREATER THAN 100.5 F  *CHILLS WITH OR WITHOUT FEVER  NAUSEA AND VOMITING THAT IS NOT CONTROLLED WITH YOUR NAUSEA MEDICATION  *UNUSUAL SHORTNESS OF BREATH  *UNUSUAL BRUISING OR BLEEDING  TENDERNESS IN MOUTH AND THROAT WITH OR WITHOUT PRESENCE OF ULCERS  *URINARY PROBLEMS  *BOWEL PROBLEMS  UNUSUAL RASH Items with * indicate a potential emergency and should be followed up as soon as possible.  Feel free to call the clinic you have any questions or concerns. The clinic phone number is [phone].  Please show the CHEMO ALERT CARD at check-in to the Emergency Department and triage nurse.   

## 2016-09-30 NOTE — Progress Notes (Signed)
[facility] OFFICE PROGRESS NOTE   Diagnosis:  Pancreas cancer  INTERVAL HISTORY:   [name] returns as scheduled. She completed cycle 1 FOLFOX 09/04/2016. Cycle 2 was held 09/18/2016 and rescheduled for 1 week due to delayed nausea and diarrhea following cycle 1, neutropenia. [name] reports there were no infusion appointments available last week so she rescheduled to today. She has occasional mild nausea. No mouth sores. No diarrhea. No numbness or tingling in her hands or feet. Abdominal pain continues to be improved.  Objective:  Vital signs in last 24 hours:  Blood pressure 115/67, pulse 64, temperature 97.6 F (36.4 C), temperature source Oral, resp. rate 18, weight 179 lb 8 oz (81.4 kg), SpO2 99 %.    HEENT: No thrush or ulcers. Resp: Lungs clear bilaterally. Cardio: Regular rate and rhythm. GI: Abdomen soft and nontender. No hepatomegaly. No mass. Vascular: No leg edema.  Port-A-Cath without erythema.  Lab Results:  Lab Results  Component Value Date   WBC 4.1 09/30/2016   HGB 10.7 (L) 09/30/2016   HCT 31.6 (L) 09/30/2016   MCV 88.8 09/30/2016   PLT 174 09/30/2016   NEUTROABS 2.0 09/30/2016    Imaging:  No results found.  Medications: I have reviewed the patient's current medications.  Assessment/Plan: 1. 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 Elevated CA 19-9 ? CT chest 08/04/2014-negative for metastatic disease ? Pancreaticoduodenectomy 08/30/2014, stage II (T3 N0) moderately differential adenocarcinoma, negative resection margins (1 mm retroperitoneal margin) ? Initiation of adjuvant gemcitabine 10/26/2014. ? Gemcitabine held 11/02/2014 due to neutropenia. ? Gemcitabine 11/09/2014 dose reduced 800 mg/m. ? Gemcitabine held 11/16/2014 due to neutropenia. ? Gemcitabine resumed 11/23/2014 every 2 week schedule. ? Cycle 6 gemcitabine 01/05/2015 ? Cycle 7 gemcitabine  01/18/2015 ? Cycle 8 gemcitabine 02/01/2015 ? Cycle 9 gemcitabine 02/15/2015 ? Cycle 10 gemcitabine 03/01/2015 ? Cycle 11 gemcitabine 03/15/2015 ? Cycle 12 gemcitabine 03/29/2015 ? Elevated CA 19-01 May 2016 ? CTs 06/17/2016-new soft tissue mass at the root of the mesentery with vascular involvement ? PET 06/27/2016-hypermetabolic activity associated with soft tissue adjacent to surgical clips in the central mesentery ? Status post SBRTto the mesenteric mass completed 07/26/2016 ? CT abdomen/pelvis 08/23/2016 -mesenteric mass stable to slightly decreased in size. ? Cycle 1 FOLFOX 09/03/2016 ? Cycle 2 FOLFOX 09/30/2016  2. 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 Admission with post ERCP pancreatitis 06/16/2014  4. History of abdominal pain secondary to #1  5. Pulmonary embolism diagnosed on a CT of the abdomen 09/16/2014  Negative lower extremity Dopplers 09/17/2014  6. Multiple orthopedic surgical procedures  7. Endometrial cancer,stage IA, grade 1 endometrioid adenocarcinoma, 18% myometrial invasion, no lymphovascular space involvement, negative washings  Status post robotic total hysterectomy and bilateral salpingo-oophorectomy 11/30/2010  Recurrent tumor left lateral vagina status post biopsy 11/24/2014 with pathology confirming adenocarcinoma with focal squamous differentiation consistent with endometrial adenocarcinoma  Staging CT scans 12/06/2014 with no evidence of local pancreatic cancer recurrence. Small fluid collection adjacent to the left adrenal gland. Severe hepatic steatosis. No evidence of local extension of endometrial carcinoma. Carcinoma not well-defined at the vaginal cuff. 5 mm right external iliac lymph node. 3.6 mm left external iliac lymph node  Brachytherapy initiated 12/22/2014, completed  01/19/2015  CT abdomen/pelvis 07/24/2015 revealed a 3 x 4 cm soft tissue focus  at the vaginal apex  PET scan 08/11/2015 revealed no mass at the vaginal apex and no evidence of metastatic disease  Cycle 1 FOLFOX 09/04/2016  Cycle 2 FOLFOX 09/30/2016 (Neulasta added)  8. History of atrial fibrillation-maintained on xarelto  9. Family history of multiple cancers-negative CancerNext gene panel  10. Prolonged nausea following the pancreaticoduodenectomy. Improved 10/26/2014.  11. Port-A-Cath placement 10/21/2014.  12. History of Neutropenia secondary to chemotherapy   13. Diarrhea. Question pancreatic insufficiency. Pancreatic enzyme replacement initiated 01/05/2015. Recurrent diarrhea following a course of antibiotics March 2017.  14. History of positional vertigo-resolved  15. Pain-abdominaland back pain-likely secondary to the mesenteric mass  16. Neutropenia secondary to chemotherapy 09/18/2016.  17. Delayed nausea and diarrhea following cycle 1 FOLFOX. Emend added with cycle 2.   Disposition: [name] appears stable. She has completed 1 cycle of FOLFOX. Cycle 2 was held due to a combination of delayed nausea, diarrhea and neutropenia. Emend will be added to the premedication regimen beginning today. She will receive Neulasta on the day of pump discontinuation. She will return for a follow-up visit and cycle 3 FOLFOX in 2 weeks. She will contact the office in the interim with any problems.  Plan reviewed with Dr. [name].  Ned Card ANP/GNP-BC   09/30/2016  11:40 AM

## 2016-09-30 NOTE — Telephone Encounter (Signed)
Appointments scheduled per 1/8 LOS. Patient given AVS report and calendars with future scheduled appointments.  °

## 2016-09-30 NOTE — Patient Instructions (Signed)

## 2016-10-02 ENCOUNTER — Ambulatory Visit: Payer: Medicare Other | Admitting: Oncology

## 2016-10-02 ENCOUNTER — Ambulatory Visit (HOSPITAL_BASED_OUTPATIENT_CLINIC_OR_DEPARTMENT_OTHER): Payer: Medicare Other

## 2016-10-02 ENCOUNTER — Ambulatory Visit: Payer: Medicare Other

## 2016-10-02 ENCOUNTER — Other Ambulatory Visit: Payer: Medicare Other

## 2016-10-02 VITALS — BP 121/62 | HR 51 | Temp 97.9°F | Resp 18

## 2016-10-02 DIAGNOSIS — Z452 Encounter for adjustment and management of vascular access device: Secondary | ICD-10-CM | POA: Diagnosis not present

## 2016-10-02 DIAGNOSIS — C250 Malignant neoplasm of head of pancreas: Secondary | ICD-10-CM | POA: Diagnosis not present

## 2016-10-02 DIAGNOSIS — D701 Agranulocytosis secondary to cancer chemotherapy: Secondary | ICD-10-CM

## 2016-10-02 MED ORDER — PEGFILGRASTIM INJECTION 6 MG/0.6ML ~~LOC~~
6.0000 mg | PREFILLED_SYRINGE | Freq: Once | SUBCUTANEOUS | Status: AC
  Administered 2016-10-02: 6 mg via SUBCUTANEOUS
  Filled 2016-10-02: qty 0.6

## 2016-10-02 MED ORDER — HEPARIN SOD (PORK) LOCK FLUSH 100 UNIT/ML IV SOLN
500.0000 [IU] | Freq: Once | INTRAVENOUS | Status: AC | PRN
  Administered 2016-10-02: 500 [IU]
  Filled 2016-10-02: qty 5

## 2016-10-02 MED ORDER — SODIUM CHLORIDE 0.9% FLUSH
10.0000 mL | INTRAVENOUS | Status: DC | PRN
  Administered 2016-10-02: 10 mL
  Filled 2016-10-02: qty 10

## 2016-10-02 NOTE — Patient Instructions (Signed)

## 2016-10-09 ENCOUNTER — Other Ambulatory Visit: Payer: Medicare Other

## 2016-10-09 ENCOUNTER — Ambulatory Visit: Payer: Medicare Other | Admitting: Oncology

## 2016-10-11 ENCOUNTER — Ambulatory Visit: Payer: Medicare Other

## 2016-10-13 ENCOUNTER — Other Ambulatory Visit: Payer: Self-pay | Admitting: Oncology

## 2016-10-14 ENCOUNTER — Telehealth: Payer: Self-pay | Admitting: Oncology

## 2016-10-14 ENCOUNTER — Ambulatory Visit (HOSPITAL_BASED_OUTPATIENT_CLINIC_OR_DEPARTMENT_OTHER): Payer: Medicare Other | Admitting: Oncology

## 2016-10-14 ENCOUNTER — Telehealth: Payer: Self-pay | Admitting: *Deleted

## 2016-10-14 ENCOUNTER — Ambulatory Visit (HOSPITAL_BASED_OUTPATIENT_CLINIC_OR_DEPARTMENT_OTHER): Payer: Medicare Other

## 2016-10-14 ENCOUNTER — Other Ambulatory Visit (HOSPITAL_BASED_OUTPATIENT_CLINIC_OR_DEPARTMENT_OTHER): Payer: Medicare Other

## 2016-10-14 VITALS — BP 112/65 | HR 52 | Temp 98.0°F | Ht 63.0 in | Wt 174.9 lb

## 2016-10-14 DIAGNOSIS — Z95828 Presence of other vascular implants and grafts: Secondary | ICD-10-CM

## 2016-10-14 DIAGNOSIS — R197 Diarrhea, unspecified: Secondary | ICD-10-CM

## 2016-10-14 DIAGNOSIS — R110 Nausea: Secondary | ICD-10-CM

## 2016-10-14 DIAGNOSIS — C250 Malignant neoplasm of head of pancreas: Secondary | ICD-10-CM

## 2016-10-14 DIAGNOSIS — K831 Obstruction of bile duct: Secondary | ICD-10-CM

## 2016-10-14 DIAGNOSIS — D701 Agranulocytosis secondary to cancer chemotherapy: Secondary | ICD-10-CM

## 2016-10-14 DIAGNOSIS — C541 Malignant neoplasm of endometrium: Secondary | ICD-10-CM | POA: Diagnosis not present

## 2016-10-14 DIAGNOSIS — Z5111 Encounter for antineoplastic chemotherapy: Secondary | ICD-10-CM | POA: Diagnosis not present

## 2016-10-14 DIAGNOSIS — Z452 Encounter for adjustment and management of vascular access device: Secondary | ICD-10-CM | POA: Diagnosis not present

## 2016-10-14 LAB — COMPREHENSIVE METABOLIC PANEL
ALBUMIN: 3.7 g/dL (ref 3.5–5.0)
ALT: 48 U/L (ref 0–55)
AST: 58 U/L — AB (ref 5–34)
Alkaline Phosphatase: 150 U/L (ref 40–150)
Anion Gap: 9 mEq/L (ref 3–11)
BUN: 10 mg/dL (ref 7.0–26.0)
CHLORIDE: 104 meq/L (ref 98–109)
CO2: 24 meq/L (ref 22–29)
Calcium: 9.1 mg/dL (ref 8.4–10.4)
Creatinine: 0.7 mg/dL (ref 0.6–1.1)
EGFR: 84 mL/min/{1.73_m2} — ABNORMAL LOW (ref >90)
GLUCOSE: 111 mg/dL (ref 70–140)
POTASSIUM: 4 meq/L (ref 3.5–5.1)
SODIUM: 137 meq/L (ref 136–145)
Total Bilirubin: 0.49 mg/dL (ref 0.20–1.20)
Total Protein: 6.8 g/dL (ref 6.4–8.3)

## 2016-10-14 LAB — CBC WITH DIFFERENTIAL/PLATELET
BASO%: 0.3 % (ref 0.0–2.0)
BASOS ABS: 0 10*3/uL (ref 0.0–0.1)
EOS%: 1.7 % (ref 0.0–7.0)
Eosinophils Absolute: 0.1 10*3/uL (ref 0.0–0.5)
HCT: 32.5 % — ABNORMAL LOW (ref 34.8–46.6)
HEMOGLOBIN: 11 g/dL — AB (ref 11.6–15.9)
LYMPH%: 22.6 % (ref 14.0–49.7)
MCH: 30.2 pg (ref 25.1–34.0)
MCHC: 33.8 g/dL (ref 31.5–36.0)
MCV: 89.3 fL (ref 79.5–101.0)
MONO#: 0.5 10*3/uL (ref 0.1–0.9)
MONO%: 7.4 % (ref 0.0–14.0)
NEUT#: 4.5 10*3/uL (ref 1.5–6.5)
NEUT%: 68 % (ref 38.4–76.8)
Platelets: 140 10*3/uL — ABNORMAL LOW (ref 145–400)
RBC: 3.64 10*6/uL — AB (ref 3.70–5.45)
RDW: 14.8 % — AB (ref 11.2–14.5)
WBC: 6.6 10*3/uL (ref 3.9–10.3)
lymph#: 1.5 10*3/uL (ref 0.9–3.3)

## 2016-10-14 MED ORDER — DEXTROSE 5 % IV SOLN
400.0000 mg/m2 | Freq: Once | INTRAVENOUS | Status: AC
  Administered 2016-10-14: 752 mg via INTRAVENOUS
  Filled 2016-10-14: qty 37.6

## 2016-10-14 MED ORDER — OXYCODONE-ACETAMINOPHEN 5-325 MG PO TABS: 1.0000 | ORAL_TABLET | ORAL | 0 refills | Status: DC | PRN

## 2016-10-14 MED ORDER — OXALIPLATIN CHEMO INJECTION 100 MG/20ML
85.0000 mg/m2 | Freq: Once | INTRAVENOUS | Status: AC
  Administered 2016-10-14: 160 mg via INTRAVENOUS
  Filled 2016-10-14: qty 32

## 2016-10-14 MED ORDER — DEXAMETHASONE 4 MG PO TABS: 4.0000 mg | ORAL_TABLET | Freq: Two times a day (BID) | ORAL | 0 refills | Status: DC

## 2016-10-14 MED ORDER — DEXTROSE 5 % IV SOLN
Freq: Once | INTRAVENOUS | Status: AC
  Administered 2016-10-14: 11:00:00 via INTRAVENOUS

## 2016-10-14 MED ORDER — FOSAPREPITANT DIMEGLUMINE INJECTION 150 MG
Freq: Once | INTRAVENOUS | Status: AC
  Administered 2016-10-14: 11:00:00 via INTRAVENOUS
  Filled 2016-10-14: qty 5

## 2016-10-14 MED ORDER — FLUOROURACIL CHEMO INJECTION 2.5 GM/50ML
400.0000 mg/m2 | Freq: Once | INTRAVENOUS | Status: AC
Start: 2016-10-14 — End: 2016-10-14
  Administered 2016-10-14: 750 mg via INTRAVENOUS
  Filled 2016-10-14: qty 15

## 2016-10-14 MED ORDER — SODIUM CHLORIDE 0.9 % IV SOLN
2400.0000 mg/m2 | INTRAVENOUS | Status: DC
  Administered 2016-10-14: 4500 mg via INTRAVENOUS
  Filled 2016-10-14: qty 90

## 2016-10-14 MED ORDER — SODIUM CHLORIDE 0.9 % IJ SOLN
10.0000 mL | INTRAMUSCULAR | Status: DC | PRN
  Administered 2016-10-14: 10 mL via INTRAVENOUS
  Filled 2016-10-14: qty 10

## 2016-10-14 MED ORDER — PALONOSETRON HCL INJECTION 0.25 MG/5ML
0.2500 mg | Freq: Once | INTRAVENOUS | Status: AC
  Administered 2016-10-14: 0.25 mg via INTRAVENOUS

## 2016-10-14 MED ORDER — PALONOSETRON HCL INJECTION 0.25 MG/5ML
INTRAVENOUS | Status: AC
  Filled 2016-10-14: qty 5

## 2016-10-14 NOTE — Telephone Encounter (Signed)
Appointments scheduled per 1/22 LOS. Patient given AVS report and calendars with future scheduled appointments.  °

## 2016-10-14 NOTE — Progress Notes (Signed)
[name] OFFICE PROGRESS NOTE    Diagnosis:   pancreas cancer  INTERVAL HISTORY:    [name] returns as scheduled. She completed another cycle of FOLFOx wih Neulsta support on 0108/2018. She reports nausea beginning on the day of chemotherapy. The nause lasted for greater  than one week. No emesis. She had cold sensitivity following chemotherapy. No numbness today. She had pain in the neck  And head folowing Neulata.  [name] reports improvement abdominal pain. She had multiple episodes of diarrhea on  10/11/2016. This had resolved by 10/12/2016.  Objective:  Vital signs in last 24 hours:  Blood pressure 112/65, pulse (!) 52, temperature 98 F (36.7 C), temperature source Oral, height 5\' 3"  (1.6 m), weight 174 lb 14.4 oz (79.3 kg), SpO2 98 %.    HEENT:  No thrush or ulcers Resp:  Lungs clear bilaterally Cardio:  reguar rate and rhythm GI:  No hepatomegaly, nontender Vascular:  No leg edema     Portacath/PICC-without erythema  Lab Results:  Lab Results  Component Value Date   WBC 6.6 10/14/2016   HGB 11.0 (L) 10/14/2016   HCT 32.5 (L) 10/14/2016   MCV 89.3 10/14/2016   PLT 140 (L) 10/14/2016   NEUTROABS 4.5 10/14/2016     Medications: I have reviewed the patient's current medications.  Assessment/Plan: 1. 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 Elevated CA 19-9 ? CT chest 08/04/2014-negative for metastatic disease ? Pancreaticoduodenectomy 08/30/2014, stage II (T3 N0) moderately differential adenocarcinoma, negative resection margins (1 mm retroperitoneal margin) ? Initiation of adjuvant gemcitabine 10/26/2014. ? Gemcitabine held 11/02/2014 due to neutropenia. ? Gemcitabine 11/09/2014 dose reduced 800 mg/m. ? Gemcitabine held 11/16/2014 due to neutropenia. ? Gemcitabine resumed 11/23/2014 every 2 week schedule. ? Cycle 6 gemcitabine 01/05/2015 ? Cycle 7 gemcitabine  01/18/2015 ? Cycle 8 gemcitabine 02/01/2015 ? Cycle 9 gemcitabine 02/15/2015 ? Cycle 10 gemcitabine 03/01/2015 ? Cycle 11 gemcitabine 03/15/2015 ? Cycle 12 gemcitabine 03/29/2015 ? Elevated CA 19-01 May 2016 ? CTs 06/17/2016-new soft tissue mass at the root of the mesentery with vascular involvement ? PET 06/27/2016-hypermetabolic activity associated with soft tissue adjacent to surgical clips in the central mesentery ? Status post SBRTto the mesenteric mass completed 07/26/2016 ? CT abdomen/pelvis 08/23/2016 -mesenteric mass stable to slightly decreased in size. ? Cycle 1 FOLFOX 09/03/2016 ? Cycle 2 FOLFOX 09/30/2016 ?  cycle 3 FOLFOX 10/14/2016  2. 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 Admission with post ERCP pancreatitis 06/16/2014  4. History of abdominal pain secondary to #1  5. Pulmonary embolism diagnosed on a CT of the abdomen 09/16/2014  Negative lower extremity Dopplers 09/17/2014  6. Multiple orthopedic surgical procedures  7. Endometrial cancer,stage IA, grade 1 endometrioid adenocarcinoma, 18% myometrial invasion, no lymphovascular space involvement, negative washings  Status post robotic total hysterectomy and bilateral salpingo-oophorectomy 11/30/2010  Recurrent tumor left lateral vagina status post biopsy 11/24/2014 with pathology confirming adenocarcinoma with focal squamous differentiation consistent with endometrial adenocarcinoma  Staging CT scans 12/06/2014 with no evidence of local pancreatic cancer recurrence. Small fluid collection adjacent to the left adrenal gland. Severe hepatic steatosis. No evidence of local extension of endometrial carcinoma. Carcinoma not well-defined at the vaginal cuff. 5 mm right external iliac lymph node. 3.6 mm left external iliac lymph node  Brachytherapy  initiated 12/22/2014,  completed 01/19/2015  CT abdomen/pelvis 07/24/2015 revealed a 3 x 4 cm soft tissue focus at the vaginal apex  PET scan 08/11/2015 revealed no mass at the vaginal apex and no evidence of metastatic disease  Cycle 1 FOLFOX 09/04/2016  Cycle 2 FOLFOX 09/30/2016 (Neulasta added)  8. History of atrial fibrillation-maintained on xarelto  9. Family history of multiple cancers-negative CancerNext gene panel  10. Prolonged nausea following the pancreaticoduodenectomy. Improved 10/26/2014.  11. Port-A-Cath placement 10/21/2014.  12. History of Neutropenia secondary to chemotherapy   13. Diarrhea. Question pancreatic insufficiency. Pancreatic enzyme replacement initiated 01/05/2015. Recurrent diarrhea following a course of antibiotics March 2017.  14. History of positional vertigo-resolved  15. Pain-abdominaland back pain-likely secondary to the mesenteric mass  16. Neutropenia secondary to chemotherapy 09/18/2016.  17. Delayed nausea and diarrhea following  FOLFOX. Emend added with cycle 2. Decadron prophylaxis added with cycle 3     Disposition:   [name] has completed 2 cycles of FOLFOX. The chemotherapy has been complicated by prolonged nausea. We added prophylactic Decadron today. She will use Ativan as needed for nausea.  she  Had pain followng Neulasta. We prescibed oxycodone to use as needed.   [name] will return for an office visit and chemotherapy In 2 weeks. We will check the CA 19-9 when she returns in 2 weeks.   25 minutes were spent with the patient today. The majority of the time was used for counseling and coordination of care.  Betsy Coder, MD  10/14/2016  10:21 AM

## 2016-10-14 NOTE — Patient Instructions (Signed)
[facility] Discharge Instructions for Patients Receiving Chemotherapy  Today you received the following chemotherapy agents Oxaliplatin, Leucovorin, and Adrucil  To help prevent nausea and vomiting after your treatment, we encourage you to take your nausea medication as directed. No Zofran for 3 days.   If you develop nausea and vomiting that is not controlled by your nausea medication, call the clinic.   BELOW ARE SYMPTOMS THAT SHOULD BE REPORTED IMMEDIATELY:  *FEVER GREATER THAN 100.5 F  *CHILLS WITH OR WITHOUT FEVER  NAUSEA AND VOMITING THAT IS NOT CONTROLLED WITH YOUR NAUSEA MEDICATION  *UNUSUAL SHORTNESS OF BREATH  *UNUSUAL BRUISING OR BLEEDING  TENDERNESS IN MOUTH AND THROAT WITH OR WITHOUT PRESENCE OF ULCERS  *URINARY PROBLEMS  *BOWEL PROBLEMS  UNUSUAL RASH Items with * indicate a potential emergency and should be followed up as soon as possible.  Feel free to call the clinic you have any questions or concerns. The clinic phone number is [phone].  Please show the [facility] at check-in to the Emergency Department and triage nurse.  Oxaliplatin Injection What is this medicine? OXALIPLATIN (ox AL i PLA tin) is a chemotherapy drug. It targets fast dividing cells, like cancer cells, and causes these cells to die. This medicine is used to treat cancers of the colon and rectum, and many other cancers. This medicine may be used for other purposes; ask your health care provider or pharmacist if you have questions. COMMON BRAND NAME(S): Eloxatin What should I tell my health care provider before I take this medicine? They need to know if you have any of these conditions: -kidney disease -an unusual or allergic reaction to oxaliplatin, other chemotherapy, other medicines, foods, dyes, or preservatives -pregnant or trying to get pregnant -breast-feeding How should I use this medicine? This drug is given as an infusion into a vein. It is  administered in a hospital or clinic by a specially trained health care professional. Talk to your pediatrician regarding the use of this medicine in children. Special care may be needed. Overdosage: If you think you have taken too much of this medicine contact a poison control center or emergency room at once. NOTE: This medicine is only for you. Do not share this medicine with others. What if I miss a dose? It is important not to miss a dose. Call your doctor or health care professional if you are unable to keep an appointment. What may interact with this medicine? -medicines to increase blood counts like filgrastim, pegfilgrastim, sargramostim -probenecid -some antibiotics like amikacin, gentamicin, neomycin, polymyxin B, streptomycin, tobramycin -zalcitabine Talk to your doctor or health care professional before taking any of these medicines: -acetaminophen -aspirin -ibuprofen -ketoprofen -naproxen This list may not describe all possible interactions. Give your health care provider a list of all the medicines, herbs, non-prescription drugs, or dietary supplements you use. Also tell them if you smoke, drink alcohol, or use illegal drugs. Some items may interact with your medicine. What should I watch for while using this medicine? Your condition will be monitored carefully while you are receiving this medicine. You will need important blood work done while you are taking this medicine. This medicine can make you more sensitive to cold. Do not drink cold drinks or use ice. Cover exposed skin before coming in contact with cold temperatures or cold objects. When out in cold weather wear warm clothing and cover your mouth and nose to warm the air that goes into your lungs. Tell your doctor if you get  sensitive to the cold. This drug may make you feel generally unwell. This is not uncommon, as chemotherapy can affect healthy cells as well as cancer cells. Report any side effects. Continue your  course of treatment even though you feel ill unless your doctor tells you to stop. In some cases, you may be given additional medicines to help with side effects. Follow all directions for their use. Call your doctor or health care professional for advice if you get a fever, chills or sore throat, or other symptoms of a cold or flu. Do not treat yourself. This drug decreases your body's ability to fight infections. Try to avoid being around people who are sick. This medicine may increase your risk to bruise or bleed. Call your doctor or health care professional if you notice any unusual bleeding. Be careful brushing and flossing your teeth or using a toothpick because you may get an infection or bleed more easily. If you have any dental work done, tell your dentist you are receiving this medicine. Avoid taking products that contain aspirin, acetaminophen, ibuprofen, naproxen, or ketoprofen unless instructed by your doctor. These medicines may hide a fever. Do not become pregnant while taking this medicine. Women should inform their doctor if they wish to become pregnant or think they might be pregnant. There is a potential for serious side effects to an unborn child. Talk to your health care professional or pharmacist for more information. Do not breast-feed an infant while taking this medicine. Call your doctor or health care professional if you get diarrhea. Do not treat yourself. What side effects may I notice from receiving this medicine? Side effects that you should report to your doctor or health care professional as soon as possible: -allergic reactions like skin rash, itching or hives, swelling of the face, lips, or tongue -low blood counts - This drug may decrease the number of white blood cells, red blood cells and platelets. You may be at increased risk for infections and bleeding. -signs of infection - fever or chills, cough, sore throat, pain or difficulty passing urine -signs of decreased  platelets or bleeding - bruising, pinpoint red spots on the skin, black, tarry stools, nosebleeds -signs of decreased red blood cells - unusually weak or tired, fainting spells, lightheadedness -breathing problems -chest pain, pressure -cough -diarrhea -jaw tightness -mouth sores -nausea and vomiting -pain, swelling, redness or irritation at the injection site -pain, tingling, numbness in the hands or feet -problems with balance, talking, walking -redness, blistering, peeling or loosening of the skin, including inside the mouth -trouble passing urine or change in the amount of urine Side effects that usually do not require medical attention (report to your doctor or health care professional if they continue or are bothersome): -changes in vision -constipation -hair loss -loss of appetite -metallic taste in the mouth or changes in taste -stomach pain This list may not describe all possible side effects. Call your doctor for medical advice about side effects. You may report side effects to FDA at 1-800-FDA-1088. Where should I keep my medicine? This drug is given in a hospital or clinic and will not be stored at home. NOTE: This sheet is a summary. It may not cover all possible information. If you have questions about this medicine, talk to your doctor, pharmacist, or health care provider.  2017 Elsevier/Gold Standard (2008-04-05 17:22:47)  Leucovorin injection What is this medicine? LEUCOVORIN (loo koe VOR in) is used to prevent or treat the harmful effects of some medicines. This medicine  is used to treat anemia caused by a low amount of folic acid in the body. It is also used with 5-fluorouracil (5-FU) to treat colon cancer. This medicine may be used for other purposes; ask your health care provider or pharmacist if you have questions. What should I tell my health care provider before I take this medicine? They need to know if you have any of these conditions: -anemia from low  levels of vitamin B-12 in the blood -an unusual or allergic reaction to leucovorin, folic acid, other medicines, foods, dyes, or preservatives -pregnant or trying to get pregnant -breast-feeding How should I use this medicine? This medicine is for injection into a muscle or into a vein. It is given by a health care professional in a hospital or clinic setting. Talk to your pediatrician regarding the use of this medicine in children. Special care may be needed. Overdosage: If you think you have taken too much of this medicine contact a poison control center or emergency room at once. NOTE: This medicine is only for you. Do not share this medicine with others. What if I miss a dose? This does not apply. What may interact with this medicine? -capecitabine -fluorouracil -phenobarbital -phenytoin -primidone -trimethoprim-sulfamethoxazole This list may not describe all possible interactions. Give your health care provider a list of all the medicines, herbs, non-prescription drugs, or dietary supplements you use. Also tell them if you smoke, drink alcohol, or use illegal drugs. Some items may interact with your medicine. What should I watch for while using this medicine? Your condition will be monitored carefully while you are receiving this medicine. This medicine may increase the side effects of 5-fluorouracil, 5-FU. Tell your doctor or health care professional if you have diarrhea or mouth sores that do not get better or that get worse. What side effects may I notice from receiving this medicine? Side effects that you should report to your doctor or health care professional as soon as possible: -allergic reactions like skin rash, itching or hives, swelling of the face, lips, or tongue -breathing problems -fever, infection -mouth sores -unusual bleeding or bruising -unusually weak or tired Side effects that usually do not require medical attention (report to your doctor or health care  professional if they continue or are bothersome): -constipation or diarrhea -loss of appetite -nausea, vomiting This list may not describe all possible side effects. Call your doctor for medical advice about side effects. You may report side effects to FDA at 1-800-FDA-1088. Where should I keep my medicine? This drug is given in a hospital or clinic and will not be stored at home. NOTE: This sheet is a summary. It may not cover all possible information. If you have questions about this medicine, talk to your doctor, pharmacist, or health care provider.  2017 Elsevier/Gold Standard (2008-03-15 16:50:29)  Fluorouracil, 5-FU injection What is this medicine? FLUOROURACIL, 5-FU (flure oh YOOR a sil) is a chemotherapy drug. It slows the growth of cancer cells. This medicine is used to treat many types of cancer like breast cancer, colon or rectal cancer, pancreatic cancer, and stomach cancer. This medicine may be used for other purposes; ask your health care provider or pharmacist if you have questions. COMMON BRAND NAME(S): Adrucil What should I tell my health care provider before I take this medicine? They need to know if you have any of these conditions: -blood disorders -dihydropyrimidine dehydrogenase (DPD) deficiency -infection (especially a virus infection such as chickenpox, cold sores, or herpes) -kidney disease -liver disease -malnourished,  poor nutrition -recent or ongoing radiation therapy -an unusual or allergic reaction to fluorouracil, other chemotherapy, other medicines, foods, dyes, or preservatives -pregnant or trying to get pregnant -breast-feeding How should I use this medicine? This drug is given as an infusion or injection into a vein. It is administered in a hospital or clinic by a specially trained health care professional. Talk to your pediatrician regarding the use of this medicine in children. Special care may be needed. Overdosage: If you think you have taken too  much of this medicine contact a poison control center or emergency room at once. NOTE: This medicine is only for you. Do not share this medicine with others. What if I miss a dose? It is important not to miss your dose. Call your doctor or health care professional if you are unable to keep an appointment. What may interact with this medicine? -allopurinol -cimetidine -dapsone -digoxin -hydroxyurea -leucovorin -levamisole -medicines for seizures like ethotoin, fosphenytoin, phenytoin -medicines to increase blood counts like filgrastim, pegfilgrastim, sargramostim -medicines that treat or prevent blood clots like warfarin, enoxaparin, and dalteparin -methotrexate -metronidazole -pyrimethamine -some other chemotherapy drugs like busulfan, cisplatin, estramustine, vinblastine -trimethoprim -trimetrexate -vaccines Talk to your doctor or health care professional before taking any of these medicines: -acetaminophen -aspirin -ibuprofen -ketoprofen -naproxen This list may not describe all possible interactions. Give your health care provider a list of all the medicines, herbs, non-prescription drugs, or dietary supplements you use. Also tell them if you smoke, drink alcohol, or use illegal drugs. Some items may interact with your medicine. What should I watch for while using this medicine? Visit your doctor for checks on your progress. This drug may make you feel generally unwell. This is not uncommon, as chemotherapy can affect healthy cells as well as cancer cells. Report any side effects. Continue your course of treatment even though you feel ill unless your doctor tells you to stop. In some cases, you may be given additional medicines to help with side effects. Follow all directions for their use. Call your doctor or health care professional for advice if you get a fever, chills or sore throat, or other symptoms of a cold or flu. Do not treat yourself. This drug decreases your body's  ability to fight infections. Try to avoid being around people who are sick. This medicine may increase your risk to bruise or bleed. Call your doctor or health care professional if you notice any unusual bleeding. Be careful brushing and flossing your teeth or using a toothpick because you may get an infection or bleed more easily. If you have any dental work done, tell your dentist you are receiving this medicine. Avoid taking products that contain aspirin, acetaminophen, ibuprofen, naproxen, or ketoprofen unless instructed by your doctor. These medicines may hide a fever. Do not become pregnant while taking this medicine. Women should inform their doctor if they wish to become pregnant or think they might be pregnant. There is a potential for serious side effects to an unborn child. Talk to your health care professional or pharmacist for more information. Do not breast-feed an infant while taking this medicine. Men should inform their doctor if they wish to father a child. This medicine may lower sperm counts. Do not treat diarrhea with over the counter products. Contact your doctor if you have diarrhea that lasts more than 2 days or if it is severe and watery. This medicine can make you more sensitive to the sun. Keep out of the sun. If you cannot avoid  being in the sun, wear protective clothing and use sunscreen. Do not use sun lamps or tanning beds/booths. What side effects may I notice from receiving this medicine? Side effects that you should report to your doctor or health care professional as soon as possible: -allergic reactions like skin rash, itching or hives, swelling of the face, lips, or tongue -low blood counts - this medicine may decrease the number of white blood cells, red blood cells and platelets. You may be at increased risk for infections and bleeding. -signs of infection - fever or chills, cough, sore throat, pain or difficulty passing urine -signs of decreased platelets or  bleeding - bruising, pinpoint red spots on the skin, black, tarry stools, blood in the urine -signs of decreased red blood cells - unusually weak or tired, fainting spells, lightheadedness -breathing problems -changes in vision -chest pain -mouth sores -nausea and vomiting -pain, swelling, redness at site where injected -pain, tingling, numbness in the hands or feet -redness, swelling, or sores on hands or feet -stomach pain -unusual bleeding Side effects that usually do not require medical attention (report to your doctor or health care professional if they continue or are bothersome): -changes in finger or toe nails -diarrhea -dry or itchy skin -hair loss -headache -loss of appetite -sensitivity of eyes to the light -stomach upset -unusually teary eyes This list may not describe all possible side effects. Call your doctor for medical advice about side effects. You may report side effects to FDA at 1-800-FDA-1088. Where should I keep my medicine? This drug is given in a hospital or clinic and will not be stored at home. NOTE: This sheet is a summary. It may not cover all possible information. If you have questions about this medicine, talk to your doctor, pharmacist, or health care provider.  2017 Elsevier/Gold Standard (2008-01-13 13:53:16)

## 2016-10-14 NOTE — Telephone Encounter (Signed)
Per 1/22 LOS and staff message I have scheduled appt ans gave calendar. Notified the scheduler

## 2016-10-16 ENCOUNTER — Ambulatory Visit (HOSPITAL_BASED_OUTPATIENT_CLINIC_OR_DEPARTMENT_OTHER): Payer: Medicare Other

## 2016-10-16 ENCOUNTER — Ambulatory Visit: Payer: Medicare Other

## 2016-10-16 VITALS — BP 113/71 | HR 49 | Temp 98.5°F | Resp 18

## 2016-10-16 DIAGNOSIS — Z452 Encounter for adjustment and management of vascular access device: Secondary | ICD-10-CM | POA: Diagnosis not present

## 2016-10-16 DIAGNOSIS — D701 Agranulocytosis secondary to cancer chemotherapy: Secondary | ICD-10-CM | POA: Diagnosis not present

## 2016-10-16 DIAGNOSIS — C250 Malignant neoplasm of head of pancreas: Secondary | ICD-10-CM

## 2016-10-16 MED ORDER — HEPARIN SOD (PORK) LOCK FLUSH 100 UNIT/ML IV SOLN
500.0000 [IU] | Freq: Once | INTRAVENOUS | Status: AC | PRN
  Administered 2016-10-16: 500 [IU]
  Filled 2016-10-16: qty 5

## 2016-10-16 MED ORDER — SODIUM CHLORIDE 0.9% FLUSH
10.0000 mL | INTRAVENOUS | Status: DC | PRN
  Administered 2016-10-16: 10 mL
  Filled 2016-10-16: qty 10

## 2016-10-16 MED ORDER — PEGFILGRASTIM INJECTION 6 MG/0.6ML ~~LOC~~
6.0000 mg | PREFILLED_SYRINGE | Freq: Once | SUBCUTANEOUS | Status: AC
  Administered 2016-10-16: 6 mg via SUBCUTANEOUS
  Filled 2016-10-16: qty 0.6

## 2016-10-16 NOTE — Progress Notes (Signed)
Injection was given in Infusion Room 

## 2016-10-16 NOTE — Patient Instructions (Signed)

## 2016-10-23 ENCOUNTER — Telehealth: Payer: Self-pay | Admitting: *Deleted

## 2016-10-23 ENCOUNTER — Other Ambulatory Visit: Payer: Self-pay | Admitting: *Deleted

## 2016-10-23 ENCOUNTER — Encounter: Payer: Self-pay | Admitting: Oncology

## 2016-10-23 MED ORDER — DIPHENOXYLATE-ATROPINE 2.5-0.025 MG PO TABS: 1.0000 | ORAL_TABLET | Freq: Four times a day (QID) | ORAL | 0 refills | Status: DC | PRN

## 2016-10-23 NOTE — Telephone Encounter (Signed)
Call placed to patient after receiving mychart message regarding "diarrhea every day this week."  Patient states that she had multiple episodes of diarrhea on Sunday, Monday and Tuesday.  Last loose stool was Tuesday at 8:30PM per pt.  Pt states that she took multiple doses of Imodium which did not seem to help.  She states that she is lightheaded and dizzy.  Dr. [name] notified and order received to call in Lomotil for patient and for patient to push fluids to minimum of 64 ozs day.  Patient states that she lives with her husband and feels safe at home.  Patient verbalizes an understanding of MD instructions and has no questions at this time.  Patient instructed to call [facility] back with any further diarrhea not controlled by Imodium and Lomotil.

## 2016-10-27 ENCOUNTER — Other Ambulatory Visit: Payer: Self-pay | Admitting: Oncology

## 2016-10-28 ENCOUNTER — Ambulatory Visit: Payer: Medicare Other

## 2016-10-28 ENCOUNTER — Ambulatory Visit (HOSPITAL_BASED_OUTPATIENT_CLINIC_OR_DEPARTMENT_OTHER): Payer: Medicare Other

## 2016-10-28 ENCOUNTER — Telehealth: Payer: Self-pay | Admitting: Nurse Practitioner

## 2016-10-28 ENCOUNTER — Ambulatory Visit (HOSPITAL_BASED_OUTPATIENT_CLINIC_OR_DEPARTMENT_OTHER): Payer: Medicare Other | Admitting: Nurse Practitioner

## 2016-10-28 ENCOUNTER — Other Ambulatory Visit (HOSPITAL_BASED_OUTPATIENT_CLINIC_OR_DEPARTMENT_OTHER): Payer: Medicare Other

## 2016-10-28 ENCOUNTER — Telehealth: Payer: Self-pay | Admitting: *Deleted

## 2016-10-28 VITALS — BP 121/66 | HR 55 | Temp 97.1°F | Resp 16

## 2016-10-28 VITALS — BP 130/72 | HR 51 | Temp 99.1°F | Resp 18 | Ht 63.0 in | Wt 175.6 lb

## 2016-10-28 DIAGNOSIS — Z95828 Presence of other vascular implants and grafts: Secondary | ICD-10-CM

## 2016-10-28 DIAGNOSIS — D701 Agranulocytosis secondary to cancer chemotherapy: Secondary | ICD-10-CM | POA: Diagnosis not present

## 2016-10-28 DIAGNOSIS — C541 Malignant neoplasm of endometrium: Secondary | ICD-10-CM

## 2016-10-28 DIAGNOSIS — R110 Nausea: Secondary | ICD-10-CM

## 2016-10-28 DIAGNOSIS — C250 Malignant neoplasm of head of pancreas: Secondary | ICD-10-CM

## 2016-10-28 DIAGNOSIS — R197 Diarrhea, unspecified: Secondary | ICD-10-CM | POA: Diagnosis not present

## 2016-10-28 DIAGNOSIS — I4891 Unspecified atrial fibrillation: Secondary | ICD-10-CM

## 2016-10-28 DIAGNOSIS — Z452 Encounter for adjustment and management of vascular access device: Secondary | ICD-10-CM | POA: Diagnosis not present

## 2016-10-28 LAB — COMPREHENSIVE METABOLIC PANEL
ALT: 36 U/L (ref 0–55)
AST: 38 U/L — AB (ref 5–34)
Albumin: 3.5 g/dL (ref 3.5–5.0)
Alkaline Phosphatase: 156 U/L — ABNORMAL HIGH (ref 40–150)
Anion Gap: 8 mEq/L (ref 3–11)
BUN: 9.5 mg/dL (ref 7.0–26.0)
CALCIUM: 8.8 mg/dL (ref 8.4–10.4)
CHLORIDE: 107 meq/L (ref 98–109)
CO2: 23 meq/L (ref 22–29)
Creatinine: 0.7 mg/dL (ref 0.6–1.1)
EGFR: 82 mL/min/{1.73_m2} — ABNORMAL LOW (ref >90)
Glucose: 135 mg/dl (ref 70–140)
POTASSIUM: 3.5 meq/L (ref 3.5–5.1)
Sodium: 138 mEq/L (ref 136–145)
Total Bilirubin: 0.48 mg/dL (ref 0.20–1.20)
Total Protein: 6 g/dL — ABNORMAL LOW (ref 6.4–8.3)

## 2016-10-28 LAB — CBC WITH DIFFERENTIAL/PLATELET
BASO%: 0.5 % (ref 0.0–2.0)
BASOS ABS: 0 10*3/uL (ref 0.0–0.1)
EOS%: 1.2 % (ref 0.0–7.0)
Eosinophils Absolute: 0.1 10*3/uL (ref 0.0–0.5)
HCT: 30.1 % — ABNORMAL LOW (ref 34.8–46.6)
HGB: 10.1 g/dL — ABNORMAL LOW (ref 11.6–15.9)
LYMPH#: 1.3 10*3/uL (ref 0.9–3.3)
LYMPH%: 20.7 % (ref 14.0–49.7)
MCH: 30.5 pg (ref 25.1–34.0)
MCHC: 33.6 g/dL (ref 31.5–36.0)
MCV: 90.9 fL (ref 79.5–101.0)
MONO#: 0.7 10*3/uL (ref 0.1–0.9)
MONO%: 10.8 % (ref 0.0–14.0)
NEUT#: 4.3 10*3/uL (ref 1.5–6.5)
NEUT%: 66.8 % (ref 38.4–76.8)
Platelets: 124 10*3/uL — ABNORMAL LOW (ref 145–400)
RBC: 3.31 10*6/uL — AB (ref 3.70–5.45)
RDW: 16.1 % — ABNORMAL HIGH (ref 11.2–14.5)
WBC: 6.5 10*3/uL (ref 3.9–10.3)

## 2016-10-28 MED ORDER — HEPARIN SOD (PORK) LOCK FLUSH 100 UNIT/ML IV SOLN
500.0000 [IU] | Freq: Once | INTRAVENOUS | Status: DC | PRN
  Filled 2016-10-28: qty 5

## 2016-10-28 MED ORDER — SODIUM CHLORIDE 0.9 % IJ SOLN
10.0000 mL | INTRAMUSCULAR | Status: DC | PRN
  Administered 2016-10-28: 10 mL via INTRAVENOUS
  Filled 2016-10-28: qty 10

## 2016-10-28 MED ORDER — SODIUM CHLORIDE 0.9 % IV SOLN
INTRAVENOUS | Status: DC
  Administered 2016-10-28: 10:00:00 via INTRAVENOUS

## 2016-10-28 MED ORDER — HEPARIN SOD (PORK) LOCK FLUSH 100 UNIT/ML IV SOLN
500.0000 [IU] | Freq: Once | INTRAVENOUS | Status: AC | PRN
  Administered 2016-10-28: 500 [IU] via INTRAVENOUS
  Filled 2016-10-28: qty 5

## 2016-10-28 NOTE — Patient Instructions (Signed)
Dehydration, Adult Dehydration is a condition in which there is not enough fluid or water in the body. This happens when you lose more fluids than you take in. Important organs, such as the kidneys, brain, and heart, cannot function without a proper amount of fluids. Any loss of fluids from the body can lead to dehydration. Dehydration can range from mild to severe. This condition should be treated right away to prevent it from becoming severe. What are the causes? This condition may be caused by:  Vomiting.  Diarrhea.  Excessive sweating, such as from heat exposure or exercise.  Not drinking enough fluid, especially:  When ill.  While doing activity that requires a lot of energy.  Excessive urination.  Fever.  Infection.  Certain medicines, such as medicines that cause the body to lose excess fluid (diuretics).  Inability to access safe drinking water.  Reduced physical ability to get adequate water and food. What increases the risk? This condition is more likely to develop in people:  Who have a poorly controlled long-term (chronic) illness, such as diabetes, heart disease, or kidney disease.  Who are age 65 or older.  Who are disabled.  Who live in a place with high altitude.  Who play endurance sports. What are the signs or symptoms? Symptoms of mild dehydration may include:   Thirst.  Dry lips.  Slightly dry mouth.  Dry, warm skin.  Dizziness. Symptoms of moderate dehydration may include:   Very dry mouth.  Muscle cramps.  Dark urine. Urine may be the color of tea.  Decreased urine production.  Decreased tear production.  Heartbeat that is irregular or faster than normal (palpitations).  Headache.  Light-headedness, especially when you stand up from a sitting position.  Fainting (syncope). Symptoms of severe dehydration may include:   Changes in skin, such as:  Cold and clammy skin.  Blotchy (mottled) or pale skin.  Skin that does  not quickly return to normal after being lightly pinched and released (poor skin turgor).  Changes in body fluids, such as:  Extreme thirst.  No tear production.  Inability to sweat when body temperature is high, such as in hot weather.  Very little urine production.  Changes in vital signs, such as:  Weak pulse.  Pulse that is more than 100 beats a minute when sitting still.  Rapid breathing.  Low blood pressure.  Other changes, such as:  Sunken eyes.  Cold hands and feet.  Confusion.  Lack of energy (lethargy).  Difficulty waking up from sleep.  Short-term weight loss.  Unconsciousness. How is this diagnosed? This condition is diagnosed based on your symptoms and a physical exam. Blood and urine tests may be done to help confirm the diagnosis. How is this treated? Treatment for this condition depends on the severity. Mild or moderate dehydration can often be treated at home. Treatment should be started right away. Do not wait until dehydration becomes severe. Severe dehydration is an emergency and it needs to be treated in a hospital. Treatment for mild dehydration may include:   Drinking more fluids.  Replacing salts and minerals in your blood (electrolytes) that you may have lost. Treatment for moderate dehydration may include:   Drinking an oral rehydration solution (ORS). This is a drink that helps you replace fluids and electrolytes (rehydrate). It can be found at pharmacies and retail stores. Treatment for severe dehydration may include:   Receiving fluids through an IV tube.  Receiving an electrolyte solution through a feeding tube that is   passed through your nose and into your stomach (nasogastric tube, or NG tube).  Correcting any abnormalities in electrolytes.  Treating the underlying cause of dehydration. Follow these instructions at home:  If directed by your health care provider, drink an ORS:  Make an ORS by following instructions on the  package.  Start by drinking small amounts, about  cup (120 mL) every 5-10 minutes.  Slowly increase how much you drink until you have taken the amount recommended by your health care provider.  Drink enough clear fluid to keep your urine clear or pale yellow. If you were told to drink an ORS, finish the ORS first, then start slowly drinking other clear fluids. Drink fluids such as:  Water. Do not drink only water. Doing that can lead to having too little salt (sodium) in the body (hyponatremia).  Ice chips.  Fruit juice that you have added water to (diluted fruit juice).  Low-calorie sports drinks.  Avoid:  Alcohol.  Drinks that contain a lot of sugar. These include high-calorie sports drinks, fruit juice that is not diluted, and soda.  Caffeine.  Foods that are greasy or contain a lot of fat or sugar.  Take over-the-counter and prescription medicines only as told by your health care provider.  Do not take sodium tablets. This can lead to having too much sodium in the body (hypernatremia).  Eat foods that contain a healthy balance of electrolytes, such as bananas, oranges, potatoes, tomatoes, and spinach.  Keep all follow-up visits as told by your health care provider. This is important. Contact a health care provider if:  You have abdominal pain that:  Gets worse.  Stays in one area (localizes).  You have a rash.  You have a stiff neck.  You are more irritable than usual.  You are sleepier or more difficult to wake up than usual.  You feel weak or dizzy.  You feel very thirsty.  You have urinated only a small amount of very dark urine over 6-8 hours. Get help right away if:  You have symptoms of severe dehydration.  You cannot drink fluids without vomiting.  Your symptoms get worse with treatment.  You have a fever.  You have a severe headache.  You have vomiting or diarrhea that:  Gets worse.  Does not go away.  You have blood or green matter  (bile) in your vomit.  You have blood in your stool. This may cause stool to look black and tarry.  You have not urinated in 6-8 hours.  You faint.  Your heart rate while sitting still is over 100 beats a minute.  You have trouble breathing. This information is not intended to replace advice given to you by your health care provider. Make sure you discuss any questions you have with your health care provider. Document Released: 09/09/2005 Document Revised: 04/05/2016 Document Reviewed: 11/03/2015 Elsevier Interactive Patient Education  2017 [employer]  

## 2016-10-28 NOTE — Telephone Encounter (Signed)
Per 2/5 LOS and staff message I have scheduled appt ans notified the scheduler

## 2016-10-28 NOTE — Progress Notes (Addendum)
[facility] OFFICE PROGRESS NOTE   Diagnosis:  Pancreas cancer  INTERVAL HISTORY:   [name] returns as scheduled. She completed cycle 3 FOLFOX 10/14/2016. He denies significant nausea. She that she may have had one or 2 mouth sores. She continues to have diarrhea. Last week she was having 8 or 9 loose stools a day. She took Imodium with no improvement. She tried Lomotil with minimal improvement. She has had 4 large volume loose stool so far today. She has mild persistent cold sensitivity. No bone pain following Neulasta. She has intermittent nosebleeds. Abdominal pain is better.  Objective:  Vital signs in last 24 hours:  Blood pressure 130/72, pulse (!) 51, temperature 99.1 F (37.3 C), temperature source Oral, resp. rate 18, height 5\' 3"  (1.6 m), weight 175 lb 9.6 oz (79.7 kg), SpO2 100 %.    HEENT: No thrush or ulcers. Resp: Lungs clear bilaterally. Cardio: Regular rate and rhythm. GI: Abdomen is soft. Mild tenderness. No hepatomegaly. Vascular: No leg edema.  Port-A-Cath without erythema.  Lab Results:  Lab Results  Component Value Date   WBC 6.5 10/28/2016   HGB 10.1 (L) 10/28/2016   HCT 30.1 (L) 10/28/2016   MCV 90.9 10/28/2016   PLT 124 (L) 10/28/2016   NEUTROABS 4.3 10/28/2016    Imaging:  No results found.  Medications: I have reviewed the patient's current medications.  Assessment/Plan: 1. 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 Elevated CA 19-9 ? CT chest 08/04/2014-negative for metastatic disease ? Pancreaticoduodenectomy 08/30/2014, stage II (T3 N0) moderately differential adenocarcinoma, negative resection margins (1 mm retroperitoneal margin) ? Initiation of adjuvant gemcitabine 10/26/2014. ? Gemcitabine held 11/02/2014 due to neutropenia. ? Gemcitabine 11/09/2014 dose reduced 800 mg/m. ? Gemcitabine held 11/16/2014 due to neutropenia. ? Gemcitabine resumed  11/23/2014 every 2 week schedule. ? Cycle 6 gemcitabine 01/05/2015 ? Cycle 7 gemcitabine 01/18/2015 ? Cycle 8 gemcitabine 02/01/2015 ? Cycle 9 gemcitabine 02/15/2015 ? Cycle 10 gemcitabine 03/01/2015 ? Cycle 11 gemcitabine 03/15/2015 ? Cycle 12 gemcitabine 03/29/2015 ? Elevated CA 19-01 May 2016 ? CTs 06/17/2016-new soft tissue mass at the root of the mesentery with vascular involvement ? PET 06/27/2016-hypermetabolic activity associated with soft tissue adjacent to surgical clips in the central mesentery ? Status post SBRTto the mesenteric mass completed 07/26/2016 ? CT abdomen/pelvis 08/23/2016 -mesenteric mass stable to slightly decreased in size. ? Cycle 1 FOLFOX 09/03/2016 ? Cycle 2 FOLFOX 09/30/2016 ?  cycle 3 FOLFOX 10/14/2016  2. 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 Admission with post ERCP pancreatitis 06/16/2014  4. History of abdominal pain secondary to #1  5. Pulmonary embolism diagnosed on a CT of the abdomen 09/16/2014  Negative lower extremity Dopplers 09/17/2014  6. Multiple orthopedic surgical procedures  7. Endometrial cancer,stage IA, grade 1 endometrioid adenocarcinoma, 18% myometrial invasion, no lymphovascular space involvement, negative washings  Status post robotic total hysterectomy and bilateral salpingo-oophorectomy 11/30/2010  Recurrent tumor left lateral vagina status post biopsy 11/24/2014 with pathology confirming adenocarcinoma with focal squamous differentiation consistent with endometrial adenocarcinoma  Staging CT scans 12/06/2014 with no evidence of local pancreatic cancer recurrence. Small fluid collection adjacent to the left adrenal gland. Severe hepatic steatosis. No evidence of local extension of endometrial carcinoma. Carcinoma not well-defined at the vaginal cuff. 5 mm  right external iliac lymph node. 3.6  mm left external iliac lymph node  Brachytherapy initiated 12/22/2014, completed 01/19/2015  CT abdomen/pelvis 07/24/2015 revealed a 3 x 4 cm soft tissue focus at the vaginal apex  PET scan 08/11/2015 revealed no mass at the vaginal apex and no evidence of metastatic disease  Cycle 1 FOLFOX 09/04/2016  Cycle 2 FOLFOX 09/30/2016 (Neulasta added)  Cycle 3 FOLFOX 10/14/2016  8. History of atrial fibrillation-maintained on xarelto  9. Family history of multiple cancers-negative CancerNext gene panel  10. Prolonged nausea following the pancreaticoduodenectomy. Improved 10/26/2014.  11. Port-A-Cath placement 10/21/2014.  12. History of Neutropenia secondary to chemotherapy   13. Diarrhea. Question pancreatic insufficiency. Pancreatic enzyme replacement initiated 01/05/2015. Recurrent diarrhea following a course of antibiotics March 2017.  14. History of positional vertigo-resolved  15. Pain-abdominaland back pain-likely secondary to the mesenteric mass  16. Neutropenia secondary to chemotherapy 09/18/2016.  17. Delayed nausea and diarrhea following  FOLFOX. Emend added with cycle 2. Decadron prophylaxis added with cycle 3  18. Diarrhea 10/28/2016. Question related to chemotherapy.    Disposition: [name] appears stable. She has completed 3 cycles of FOLFOX.   She noted marked improvement in nausea with prophylactic Decadron.  She is now having significant diarrhea. The diarrhea may be related to chemotherapy. We are holding today's treatment. She will receive IV fluids. She will submit a stool sample for C. difficile testing.  She will return for a follow-up visit and possible chemotherapy on 11/05/2016. She will contact the office with persistent severe diarrhea.    Patient seen with Dr. [name]. 25 minutes were spent face-to-face at today's visit with the majority of that time involved in counseling/coordination of  care.   Ned Card ANP/GNP-BC   10/28/2016  9:44 AM This was a shared visit with Lattie Haw,. [name] has persistent diarrhea following the most recent cycle of chemotherapy. It is unclear whether the diarrhea is related to chemotherapy, an infection, or pancreatic insufficiency. Chemotherapy will be held today. She will be tested for C. difficile.  Julieanne Manson, M.D.

## 2016-10-28 NOTE — Telephone Encounter (Signed)
Appointments scheduled per 2/5 LOS. Patient given AVS report and calendars with future scheduled appointments. °

## 2016-10-29 ENCOUNTER — Other Ambulatory Visit (HOSPITAL_COMMUNITY)
Admission: RE | Admit: 2016-10-29 | Discharge: 2016-10-29 | Disposition: A | Discharge status: Home / Self Care | Payer: Medicare Other | Source: Ambulatory Visit | Attending: Oncology | Admitting: Oncology

## 2016-10-29 ENCOUNTER — Telehealth: Payer: Self-pay

## 2016-10-29 ENCOUNTER — Ambulatory Visit: Payer: Medicare Other

## 2016-10-29 ENCOUNTER — Encounter: Payer: Self-pay | Admitting: *Deleted

## 2016-10-29 DIAGNOSIS — C250 Malignant neoplasm of head of pancreas: Secondary | ICD-10-CM | POA: Insufficient documentation

## 2016-10-29 LAB — CANCER ANTIGEN 19-9: CA 19-9: 75 U/mL — ABNORMAL HIGH (ref 0–35)

## 2016-10-29 NOTE — Telephone Encounter (Signed)
Pt called back, informed her of ca19-9 results. Pt verbalized understanding.

## 2016-10-29 NOTE — Telephone Encounter (Signed)
Left message for pt to call back regarding lab results. 

## 2016-10-29 NOTE — Telephone Encounter (Signed)
-----   Message from Ladell Pier, MD sent at 10/29/2016  9:50 AM EST ----- Please call patient, ca19-9 is better

## 2016-10-29 NOTE — Progress Notes (Signed)
Call received from [facility] in lab to inform Dr. [name] that pt.'s c-diff was rejected d/t stool was formed.  L. Marcello Moores NP notified.

## 2016-10-30 ENCOUNTER — Telehealth: Payer: Self-pay

## 2016-10-30 ENCOUNTER — Encounter: Payer: Self-pay | Admitting: Oncology

## 2016-10-30 ENCOUNTER — Ambulatory Visit: Payer: Medicare Other

## 2016-10-30 DIAGNOSIS — C250 Malignant neoplasm of head of pancreas: Secondary | ICD-10-CM

## 2016-10-30 NOTE — Telephone Encounter (Signed)
Pt called stating she was having problems. BM yesterday x9, BM x2 today so far and stomach is rolling and cramping. She was able to drink 5 bottles of water yesterday.  She sent in 1st BM of day yesterday for c-diff and it was rejected not loose enough. She states that her 1st BM is usually more formed and then it just runs out of her. The directions on package said to collect 1st stool.  S/w Lorriane Shire and had pt come and do a recollect of the runny stool.

## 2016-10-31 ENCOUNTER — Other Ambulatory Visit (HOSPITAL_COMMUNITY)
Admission: RE | Admit: 2016-10-31 | Discharge: 2016-10-31 | Disposition: A | Discharge status: Home / Self Care | Payer: Medicare Other | Source: Ambulatory Visit | Attending: Oncology | Admitting: Oncology

## 2016-10-31 DIAGNOSIS — C250 Malignant neoplasm of head of pancreas: Secondary | ICD-10-CM | POA: Diagnosis present

## 2016-10-31 LAB — C DIFFICILE QUICK SCREEN W PCR REFLEX
C DIFFICILE (CDIFF) INTERP: NOT DETECTED
C Diff antigen: NEGATIVE
C Diff toxin: NEGATIVE

## 2016-11-01 ENCOUNTER — Telehealth: Payer: Self-pay

## 2016-11-01 NOTE — Telephone Encounter (Signed)
-----   Message from Ladell Pier, MD sent at 10/31/2016  5:40 PM EST ----- Please call patien, stool c. Diff is negative

## 2016-11-01 NOTE — Telephone Encounter (Signed)
Called and informed pt husband of negative c-diff result. He will inform pt. Denies any questions or concerns at this time.

## 2016-11-05 ENCOUNTER — Ambulatory Visit: Payer: Medicare Other

## 2016-11-05 ENCOUNTER — Telehealth: Payer: Self-pay | Admitting: Nurse Practitioner

## 2016-11-05 ENCOUNTER — Other Ambulatory Visit (HOSPITAL_BASED_OUTPATIENT_CLINIC_OR_DEPARTMENT_OTHER): Payer: Medicare Other

## 2016-11-05 ENCOUNTER — Ambulatory Visit (HOSPITAL_BASED_OUTPATIENT_CLINIC_OR_DEPARTMENT_OTHER): Payer: Medicare Other

## 2016-11-05 ENCOUNTER — Ambulatory Visit (HOSPITAL_BASED_OUTPATIENT_CLINIC_OR_DEPARTMENT_OTHER): Payer: Medicare Other | Admitting: Nurse Practitioner

## 2016-11-05 DIAGNOSIS — C250 Malignant neoplasm of head of pancreas: Secondary | ICD-10-CM

## 2016-11-05 DIAGNOSIS — Z5111 Encounter for antineoplastic chemotherapy: Secondary | ICD-10-CM | POA: Diagnosis not present

## 2016-11-05 DIAGNOSIS — Z95828 Presence of other vascular implants and grafts: Secondary | ICD-10-CM

## 2016-11-05 DIAGNOSIS — C541 Malignant neoplasm of endometrium: Secondary | ICD-10-CM

## 2016-11-05 LAB — CBC WITH DIFFERENTIAL/PLATELET
BASO%: 0.7 % (ref 0.0–2.0)
Basophils Absolute: 0 10*3/uL (ref 0.0–0.1)
EOS%: 1.1 % (ref 0.0–7.0)
Eosinophils Absolute: 0.1 10*3/uL (ref 0.0–0.5)
HEMATOCRIT: 33.6 % — AB (ref 34.8–46.6)
HEMOGLOBIN: 11.2 g/dL — AB (ref 11.6–15.9)
LYMPH#: 1.1 10*3/uL (ref 0.9–3.3)
LYMPH%: 16.8 % (ref 14.0–49.7)
MCH: 31.1 pg (ref 25.1–34.0)
MCHC: 33.3 g/dL (ref 31.5–36.0)
MCV: 93.4 fL (ref 79.5–101.0)
MONO#: 0.6 10*3/uL (ref 0.1–0.9)
MONO%: 9.1 % (ref 0.0–14.0)
NEUT%: 72.3 % (ref 38.4–76.8)
NEUTROS ABS: 4.9 10*3/uL (ref 1.5–6.5)
PLATELETS: 180 10*3/uL (ref 145–400)
RBC: 3.6 10*6/uL — ABNORMAL LOW (ref 3.70–5.45)
RDW: 17.3 % — ABNORMAL HIGH (ref 11.2–14.5)
WBC: 6.8 10*3/uL (ref 3.9–10.3)

## 2016-11-05 LAB — COMPREHENSIVE METABOLIC PANEL
ALT: 30 U/L (ref 0–55)
ANION GAP: 10 meq/L (ref 3–11)
AST: 41 U/L — ABNORMAL HIGH (ref 5–34)
Albumin: 3.8 g/dL (ref 3.5–5.0)
Alkaline Phosphatase: 170 U/L — ABNORMAL HIGH (ref 40–150)
BILIRUBIN TOTAL: 0.57 mg/dL (ref 0.20–1.20)
BUN: 9.3 mg/dL (ref 7.0–26.0)
CALCIUM: 9.3 mg/dL (ref 8.4–10.4)
CO2: 24 meq/L (ref 22–29)
CREATININE: 0.7 mg/dL (ref 0.6–1.1)
Chloride: 102 mEq/L (ref 98–109)
EGFR: 82 mL/min/{1.73_m2} — ABNORMAL LOW (ref >90)
Glucose: 139 mg/dl (ref 70–140)
Potassium: 4.3 mEq/L (ref 3.5–5.1)
Sodium: 135 mEq/L — ABNORMAL LOW (ref 136–145)
TOTAL PROTEIN: 6.7 g/dL (ref 6.4–8.3)

## 2016-11-05 MED ORDER — LEUCOVORIN CALCIUM INJECTION 350 MG
400.0000 mg/m2 | Freq: Once | INTRAVENOUS | Status: AC
  Administered 2016-11-05: 752 mg via INTRAVENOUS
  Filled 2016-11-05: qty 37.6

## 2016-11-05 MED ORDER — DEXAMETHASONE 4 MG PO TABS: 4.0000 mg | ORAL_TABLET | Freq: Two times a day (BID) | ORAL | 3 refills | Status: DC

## 2016-11-05 MED ORDER — SODIUM CHLORIDE 0.9 % IV SOLN
1800.0000 mg/m2 | INTRAVENOUS | Status: DC
  Administered 2016-11-05: 3400 mg via INTRAVENOUS
  Filled 2016-11-05: qty 68

## 2016-11-05 MED ORDER — DIPHENOXYLATE-ATROPINE 2.5-0.025 MG PO TABS: 1.0000 | ORAL_TABLET | Freq: Four times a day (QID) | ORAL | 0 refills | Status: DC | PRN

## 2016-11-05 MED ORDER — PALONOSETRON HCL INJECTION 0.25 MG/5ML
0.2500 mg | Freq: Once | INTRAVENOUS | Status: AC
  Administered 2016-11-05: 0.25 mg via INTRAVENOUS

## 2016-11-05 MED ORDER — PALONOSETRON HCL INJECTION 0.25 MG/5ML
INTRAVENOUS | Status: AC
  Filled 2016-11-05: qty 5

## 2016-11-05 MED ORDER — HEPARIN SOD (PORK) LOCK FLUSH 100 UNIT/ML IV SOLN
500.0000 [IU] | Freq: Once | INTRAVENOUS | Status: DC | PRN
  Filled 2016-11-05: qty 5

## 2016-11-05 MED ORDER — SODIUM CHLORIDE 0.9 % IJ SOLN
10.0000 mL | INTRAMUSCULAR | Status: DC | PRN
  Administered 2016-11-05: 10 mL via INTRAVENOUS
  Filled 2016-11-05: qty 10

## 2016-11-05 MED ORDER — DEXTROSE 5 % IV SOLN
Freq: Once | INTRAVENOUS | Status: AC
  Administered 2016-11-05: 11:00:00 via INTRAVENOUS

## 2016-11-05 MED ORDER — OXALIPLATIN CHEMO INJECTION 100 MG/20ML
85.0000 mg/m2 | Freq: Once | INTRAVENOUS | Status: AC
  Administered 2016-11-05: 160 mg via INTRAVENOUS
  Filled 2016-11-05: qty 32

## 2016-11-05 MED ORDER — SODIUM CHLORIDE 0.9% FLUSH
10.0000 mL | INTRAVENOUS | Status: DC | PRN
  Administered 2016-11-05: 10 mL
  Filled 2016-11-05: qty 10

## 2016-11-05 MED ORDER — SODIUM CHLORIDE 0.9 % IV SOLN
Freq: Once | INTRAVENOUS | Status: AC
  Administered 2016-11-05: 11:00:00 via INTRAVENOUS
  Filled 2016-11-05: qty 5

## 2016-11-05 MED ORDER — HYDROCODONE-ACETAMINOPHEN 5-325 MG PO TABS: 1.0000 | ORAL_TABLET | ORAL | 0 refills | Status: DC | PRN

## 2016-11-05 NOTE — Telephone Encounter (Signed)
Appointments scheduled per 2/13 LOS. Patient given AVS report and calendars with future scheduled appointments. °

## 2016-11-05 NOTE — Patient Instructions (Signed)

## 2016-11-05 NOTE — Progress Notes (Signed)
[facility] OFFICE PROGRESS NOTE   Diagnosis:  Pancreas cancer  INTERVAL HISTORY:   [name] returns as scheduled. She completed cycle 3 FOLFOX 10/14/2016. Cycle 4 was held on 10/28/2016 due to severe diarrhea. She has had no diarrhea for the past 5 days. She denies nausea/vomiting. No mouth sores. Abdominal pain continues to be improved. She has mild numbness in the fingertips. The numbness does not interfere with activity. She has mild persistent cold sensitivity.  Objective:  Vital signs in last 24 hours:  Blood pressure 110/82, pulse (!) 58, temperature 99 F (37.2 C), temperature source Oral, resp. rate 18, height 5\' 3"  (1.6 m), weight 171 lb 4.8 oz (77.7 kg), SpO2 100 %.    HEENT: No thrush or ulcers. Resp: Lungs clear bilaterally. Cardio: Regular rate and rhythm. GI: Abdomen is soft. No hepatomegaly. Mild generalized tenderness. Vascular: No leg edema. Neuro: Vibratory sense intact over the fingertips per tuning fork exam. Port-A-Cath without erythema.  Lab Results:  Lab Results  Component Value Date   WBC 6.8 11/05/2016   HGB 11.2 (L) 11/05/2016   HCT 33.6 (L) 11/05/2016   MCV 93.4 11/05/2016   PLT 180 11/05/2016   NEUTROABS 4.9 11/05/2016    Imaging:  No results found.  Medications: I have reviewed the patient's current medications.  Assessment/Plan: 1. 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 Elevated CA 19-9 ? CT chest 08/04/2014-negative for metastatic disease ? Pancreaticoduodenectomy 08/30/2014, stage II (T3 N0) moderately differential adenocarcinoma, negative resection margins (1 mm retroperitoneal margin) ? Initiation of adjuvant gemcitabine 10/26/2014. ? Gemcitabine held 11/02/2014 due to neutropenia. ? Gemcitabine 11/09/2014 dose reduced 800 mg/m. ? Gemcitabine held 11/16/2014 due to neutropenia. ? Gemcitabine resumed 11/23/2014 every 2 week schedule. ? Cycle  6 gemcitabine 01/05/2015 ? Cycle 7 gemcitabine 01/18/2015 ? Cycle 8 gemcitabine 02/01/2015 ? Cycle 9 gemcitabine 02/15/2015 ? Cycle 10 gemcitabine 03/01/2015 ? Cycle 11 gemcitabine 03/15/2015 ? Cycle 12 gemcitabine 03/29/2015 ? Elevated CA 19-01 May 2016 ? CTs 06/17/2016-new soft tissue mass at the root of the mesentery with vascular involvement ? PET 06/27/2016-hypermetabolic activity associated with soft tissue adjacent to surgical clips in the central mesentery ? Status post SBRTto the mesenteric mass completed 07/26/2016 ? CT abdomen/pelvis 08/23/2016 -mesenteric mass stable to slightly decreased in size. ? Cycle 1 FOLFOX 09/03/2016 ? Cycle 2 FOLFOX 09/30/2016 ? cycle 3 FOLFOX 10/14/2016  2. 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 Admission with post ERCP pancreatitis 06/16/2014  4. History of abdominal pain secondary to #1  5. Pulmonary embolism diagnosed on a CT of the abdomen 09/16/2014  Negative lower extremity Dopplers 09/17/2014  6. Multiple orthopedic surgical procedures  7. Endometrial cancer,stage IA, grade 1 endometrioid adenocarcinoma, 18% myometrial invasion, no lymphovascular space involvement, negative washings  Status post robotic total hysterectomy and bilateral salpingo-oophorectomy 11/30/2010  Recurrent tumor left lateral vagina status post biopsy 11/24/2014 with pathology confirming adenocarcinoma with focal squamous differentiation consistent with endometrial adenocarcinoma  Staging CT scans 12/06/2014 with no evidence of local pancreatic cancer recurrence. Small fluid collection adjacent to the left adrenal gland. Severe hepatic steatosis. No evidence of local extension of endometrial carcinoma. Carcinoma not well-defined at the vaginal cuff. 5 mm right external iliac lymph node. 3.6 mm left  external iliac lymph node  Brachytherapy initiated 12/22/2014, completed 01/19/2015  CT abdomen/pelvis 07/24/2015 revealed a 3 x 4 cm soft tissue focus at the vaginal apex  PET scan 08/11/2015 revealed no mass at the vaginal apex and no evidence of metastatic disease  Cycle 1 FOLFOX 09/04/2016  Cycle 2 FOLFOX 09/30/2016 (Neulasta added)  Cycle 3 FOLFOX 10/14/2016  Cycle 4 FOLFOX 11/05/2016 (5-FU bolus eliminated, 5-FU pump dose reduced)  8. History of atrial fibrillation-maintained on xarelto  9. Family history of multiple cancers-negative CancerNext gene panel  10. Prolonged nausea following the pancreaticoduodenectomy. Improved 10/26/2014.  11. Port-A-Cath placement 10/21/2014.  12. History of Neutropenia secondary to chemotherapy   13. Diarrhea. Question pancreatic insufficiency. Pancreatic enzyme replacement initiated 01/05/2015. Recurrent diarrhea following a course of antibiotics March 2017.  14. History of positional vertigo-resolved  15. Pain-abdominaland back pain-likely secondary to the mesenteric mass  16. Neutropenia secondary to chemotherapy 09/18/2016.  17. Delayed nausea and diarrhea following FOLFOX. Emend added with cycle 2. Decadron prophylaxis added with cycle 3  18. Diarrhea 10/28/2016. Question related to chemotherapy. Negative C. difficile testing 10/31/2016.   Disposition: [name] appears stable. She has completed 3 cycles of FOLFOX. The CA-19-9 was improved after 3 cycles. Cycle 4 was held last week due to severe diarrhea. Stool tested negative for C. difficile. The diarrhea was most likely related to chemotherapy. She has had no diarrhea for the past 5 days. We decided to proceed with cycle 4 FOLFOX today as scheduled. The 5-FU bolus will be eliminated and the 5-FU pump dose reduced. She understands to begin Lomotil/Imodium if the diarrhea recurs and contact the office. Dosing instructions for Lomotil and Imodium reviewed at today's  visit.  She will return for a follow-up visit in 2 weeks. She will contact the office in the interim as outlined above or with any other problems.  Plan reviewed with Dr. [name]. 25 minutes were spent face-to-face at today's visit with the majority of that time involved in counseling/coordination of care.    Ned Card ANP/GNP-BC   11/05/2016  10:28 AM

## 2016-11-05 NOTE — Patient Instructions (Signed)
[facility] Discharge Instructions for Patients Receiving Chemotherapy  Today you received the following chemotherapy agents Oxaliplatin, Leucovorin, and Adrucil  To help prevent nausea and vomiting after your treatment, we encourage you to take your nausea medication as directed. No Zofran for 3 days.   If you develop nausea and vomiting that is not controlled by your nausea medication, call the clinic.   BELOW ARE SYMPTOMS THAT SHOULD BE REPORTED IMMEDIATELY:  *FEVER GREATER THAN 100.5 F  *CHILLS WITH OR WITHOUT FEVER  NAUSEA AND VOMITING THAT IS NOT CONTROLLED WITH YOUR NAUSEA MEDICATION  *UNUSUAL SHORTNESS OF BREATH  *UNUSUAL BRUISING OR BLEEDING  TENDERNESS IN MOUTH AND THROAT WITH OR WITHOUT PRESENCE OF ULCERS  *URINARY PROBLEMS  *BOWEL PROBLEMS  UNUSUAL RASH Items with * indicate a potential emergency and should be followed up as soon as possible.  Feel free to call the clinic you have any questions or concerns. The clinic phone number is [phone].  Please show the [facility] at check-in to the Emergency Department and triage nurse.

## 2016-11-07 ENCOUNTER — Ambulatory Visit: Payer: Medicare Other

## 2016-11-07 ENCOUNTER — Ambulatory Visit (HOSPITAL_BASED_OUTPATIENT_CLINIC_OR_DEPARTMENT_OTHER): Payer: Medicare Other

## 2016-11-07 ENCOUNTER — Telehealth: Payer: Self-pay | Admitting: *Deleted

## 2016-11-07 VITALS — BP 106/52 | HR 51 | Temp 98.2°F | Resp 17

## 2016-11-07 DIAGNOSIS — D701 Agranulocytosis secondary to cancer chemotherapy: Secondary | ICD-10-CM | POA: Diagnosis not present

## 2016-11-07 DIAGNOSIS — Z452 Encounter for adjustment and management of vascular access device: Secondary | ICD-10-CM

## 2016-11-07 DIAGNOSIS — C250 Malignant neoplasm of head of pancreas: Secondary | ICD-10-CM | POA: Diagnosis not present

## 2016-11-07 MED ORDER — PEGFILGRASTIM INJECTION 6 MG/0.6ML ~~LOC~~
6.0000 mg | PREFILLED_SYRINGE | Freq: Once | SUBCUTANEOUS | Status: AC
  Administered 2016-11-07: 6 mg via SUBCUTANEOUS
  Filled 2016-11-07: qty 0.6

## 2016-11-07 MED ORDER — SODIUM CHLORIDE 0.9% FLUSH
10.0000 mL | INTRAVENOUS | Status: DC | PRN
  Administered 2016-11-07: 10 mL
  Filled 2016-11-07: qty 10

## 2016-11-07 MED ORDER — HEPARIN SOD (PORK) LOCK FLUSH 100 UNIT/ML IV SOLN
500.0000 [IU] | Freq: Once | INTRAVENOUS | Status: AC | PRN
  Administered 2016-11-07: 500 [IU]
  Filled 2016-11-07: qty 5

## 2016-11-07 NOTE — Patient Instructions (Signed)
Implanted Port Home Guide An implanted port is a type of central line that is placed under the skin. Central lines are used to provide IV access when treatment or nutrition needs to be given through a person's veins. Implanted ports are used for long-term IV access. An implanted port may be placed because:   You need IV medicine that would be irritating to the small veins in your hands or arms.   You need long-term IV medicines, such as antibiotics.   You need IV nutrition for a long period.   You need frequent blood draws for lab tests.   You need dialysis.  Implanted ports are usually placed in the chest area, but they can also be placed in the upper arm, the abdomen, or the leg. An implanted port has two main parts:   Reservoir. The reservoir is round and will appear as a small, raised area under your skin. The reservoir is the part where a needle is inserted to give medicines or draw blood.   Catheter. The catheter is a thin, flexible tube that extends from the reservoir. The catheter is placed into a large vein. Medicine that is inserted into the reservoir goes into the catheter and then into the vein.  HOW WILL I CARE FOR MY INCISION SITE? Do not get the incision site wet. Bathe or shower as directed by your health care provider.  HOW IS MY PORT ACCESSED? Special steps must be taken to access the port:   Before the port is accessed, a numbing cream can be placed on the skin. This helps numb the skin over the port site.   Your health care provider uses a sterile technique to access the port.  Your health care provider must put on a mask and sterile gloves.  The skin over your port is cleaned carefully with an antiseptic and allowed to dry.  The port is gently pinched between sterile gloves, and a needle is inserted into the port.  Only "non-coring" port needles should be used to access the port. Once the port is accessed, a blood return should be checked. This helps  ensure that the port is in the vein and is not clogged.   If your port needs to remain accessed for a constant infusion, a clear (transparent) bandage will be placed over the needle site. The bandage and needle will need to be changed every week, or as directed by your health care provider.   Keep the bandage covering the needle clean and dry. Do not get it wet. Follow your health care provider's instructions on how to take a shower or bath while the port is accessed.   If your port does not need to stay accessed, no bandage is needed over the port.  WHAT IS FLUSHING? Flushing helps keep the port from getting clogged. Follow your health care provider's instructions on how and when to flush the port. Ports are usually flushed with saline solution or a medicine called heparin. The need for flushing will depend on how the port is used.   If the port is used for intermittent medicines or blood draws, the port will need to be flushed:   After medicines have been given.   After blood has been drawn.   As part of routine maintenance.   If a constant infusion is running, the port may not need to be flushed.  HOW LONG WILL MY PORT STAY IMPLANTED? The port can stay in for as long as your health care   provider thinks it is needed. When it is time for the port to come out, surgery will be done to remove it. The procedure is similar to the one performed when the port was put in.  WHEN SHOULD I SEEK IMMEDIATE MEDICAL CARE? When you have an implanted port, you should seek immediate medical care if:   You notice a bad smell coming from the incision site.   You have swelling, redness, or drainage at the incision site.   You have more swelling or pain at the port site or the surrounding area.   You have a fever that is not controlled with medicine. This information is not intended to replace advice given to you by your health care provider. Make sure you discuss any questions you have with  your health care provider. Document Released: 09/09/2005 Document Revised: 06/30/2013 Document Reviewed: 05/17/2013 Elsevier Interactive Patient Education  2017 [place].  Pegfilgrastim injection What is this medicine? PEGFILGRASTIM (PEG fil gra stim) is a long-acting granulocyte colony-stimulating factor that stimulates the growth of neutrophils, a type of white blood cell important in the body's fight against infection. It is used to reduce the incidence of fever and infection in patients with certain types of cancer who are receiving chemotherapy that affects the bone marrow, and to increase survival after being exposed to high doses of radiation. This medicine may be used for other purposes; ask your health care provider or pharmacist if you have questions. COMMON BRAND NAME(S): Neulasta What should I tell my health care provider before I take this medicine? They need to know if you have any of these conditions: -kidney disease -latex allergy -ongoing radiation therapy -sickle cell disease -skin reactions to acrylic adhesives (On-Body Injector only) -an unusual or allergic reaction to pegfilgrastim, filgrastim, other medicines, foods, dyes, or preservatives -pregnant or trying to get pregnant -breast-feeding How should I use this medicine? This medicine is for injection under the skin. If you get this medicine at home, you will be taught how to prepare and give the pre-filled syringe or how to use the On-body Injector. Refer to the patient Instructions for Use for detailed instructions. Use exactly as directed. Take your medicine at regular intervals. Do not take your medicine more often than directed. It is important that you put your used needles and syringes in a special sharps container. Do not put them in a trash can. If you do not have a sharps container, call your pharmacist or healthcare provider to get one. Talk to your pediatrician regarding the use of this medicine in  children. While this drug may be prescribed for selected conditions, precautions do apply. Overdosage: If you think you have taken too much of this medicine contact a poison control center or emergency room at once. NOTE: This medicine is only for you. Do not share this medicine with others. What if I miss a dose? It is important not to miss your dose. Call your doctor or health care professional if you miss your dose. If you miss a dose due to an On-body Injector failure or leakage, a new dose should be administered as soon as possible using a single prefilled syringe for manual use. What may interact with this medicine? Interactions have not been studied. Give your health care provider a list of all the medicines, herbs, non-prescription drugs, or dietary supplements you use. Also tell them if you smoke, drink alcohol, or use illegal drugs. Some items may interact with your medicine. This list may not describe  all possible interactions. Give your health care provider a list of all the medicines, herbs, non-prescription drugs, or dietary supplements you use. Also tell them if you smoke, drink alcohol, or use illegal drugs. Some items may interact with your medicine. What should I watch for while using this medicine? You may need blood work done while you are taking this medicine. If you are going to need a MRI, CT scan, or other procedure, tell your doctor that you are using this medicine (On-Body Injector only). What side effects may I notice from receiving this medicine? Side effects that you should report to your doctor or health care professional as soon as possible: -allergic reactions like skin rash, itching or hives, swelling of the face, lips, or tongue -dizziness -fever -pain, redness, or irritation at site where injected -pinpoint red spots on the skin -red or dark-brown urine -shortness of breath or breathing problems -stomach or side pain, or pain at the  shoulder -swelling -tiredness -trouble passing urine or change in the amount of urine Side effects that usually do not require medical attention (report to your doctor or health care professional if they continue or are bothersome): -bone pain -muscle pain This list may not describe all possible side effects. Call your doctor for medical advice about side effects. You may report side effects to FDA at 1-800-FDA-1088. Where should I keep my medicine? Keep out of the reach of children. Store pre-filled syringes in a refrigerator between 2 and 8 degrees C (36 and 46 degrees F). Do not freeze. Keep in carton to protect from light. Throw away this medicine if it is left out of the refrigerator for more than 48 hours. Throw away any unused medicine after the expiration date. NOTE: This sheet is a summary. It may not cover all possible information. If you have questions about this medicine, talk to your doctor, pharmacist, or health care provider.  2017 Elsevier/Gold Standard (2014-09-29 14:30:14)

## 2016-11-07 NOTE — Telephone Encounter (Signed)
Per 2/13 LOS and staff message I have scheduled appts. Notified the the scheduler first available given on 2/27

## 2016-11-11 ENCOUNTER — Other Ambulatory Visit: Payer: Medicare Other

## 2016-11-11 ENCOUNTER — Ambulatory Visit: Payer: Medicare Other | Admitting: Oncology

## 2016-11-11 ENCOUNTER — Ambulatory Visit: Payer: Medicare Other

## 2016-11-13 ENCOUNTER — Ambulatory Visit: Payer: Medicare Other

## 2016-11-17 ENCOUNTER — Other Ambulatory Visit: Payer: Self-pay | Admitting: Oncology

## 2016-11-19 ENCOUNTER — Telehealth: Payer: Self-pay | Admitting: Oncology

## 2016-11-19 ENCOUNTER — Ambulatory Visit (HOSPITAL_BASED_OUTPATIENT_CLINIC_OR_DEPARTMENT_OTHER): Payer: Medicare Other | Admitting: Nurse Practitioner

## 2016-11-19 ENCOUNTER — Ambulatory Visit (HOSPITAL_BASED_OUTPATIENT_CLINIC_OR_DEPARTMENT_OTHER): Payer: Medicare Other

## 2016-11-19 ENCOUNTER — Ambulatory Visit: Payer: Medicare Other

## 2016-11-19 ENCOUNTER — Other Ambulatory Visit (HOSPITAL_BASED_OUTPATIENT_CLINIC_OR_DEPARTMENT_OTHER): Payer: Medicare Other

## 2016-11-19 VITALS — BP 125/62 | HR 65 | Temp 98.3°F | Resp 17 | Wt 171.4 lb

## 2016-11-19 DIAGNOSIS — C250 Malignant neoplasm of head of pancreas: Secondary | ICD-10-CM

## 2016-11-19 DIAGNOSIS — I4891 Unspecified atrial fibrillation: Secondary | ICD-10-CM

## 2016-11-19 DIAGNOSIS — Z5111 Encounter for antineoplastic chemotherapy: Secondary | ICD-10-CM

## 2016-11-19 DIAGNOSIS — Z7901 Long term (current) use of anticoagulants: Secondary | ICD-10-CM | POA: Diagnosis not present

## 2016-11-19 DIAGNOSIS — Z8542 Personal history of malignant neoplasm of other parts of uterus: Secondary | ICD-10-CM | POA: Diagnosis not present

## 2016-11-19 LAB — COMPREHENSIVE METABOLIC PANEL
ALK PHOS: 166 U/L — AB (ref 40–150)
ALT: 41 U/L (ref 0–55)
ANION GAP: 10 meq/L (ref 3–11)
AST: 44 U/L — AB (ref 5–34)
Albumin: 3.6 g/dL (ref 3.5–5.0)
BUN: 15 mg/dL (ref 7.0–26.0)
CALCIUM: 9.2 mg/dL (ref 8.4–10.4)
CHLORIDE: 105 meq/L (ref 98–109)
CO2: 21 mEq/L — ABNORMAL LOW (ref 22–29)
Creatinine: 0.8 mg/dL (ref 0.6–1.1)
EGFR: 80 mL/min/{1.73_m2} — ABNORMAL LOW (ref >90)
Glucose: 159 mg/dl — ABNORMAL HIGH (ref 70–140)
POTASSIUM: 3.9 meq/L (ref 3.5–5.1)
Sodium: 137 mEq/L (ref 136–145)
Total Bilirubin: 0.52 mg/dL (ref 0.20–1.20)
Total Protein: 6.5 g/dL (ref 6.4–8.3)

## 2016-11-19 LAB — CBC WITH DIFFERENTIAL/PLATELET
BASO%: 0.7 % (ref 0.0–2.0)
Basophils Absolute: 0 10*3/uL (ref 0.0–0.1)
EOS ABS: 0.1 10*3/uL (ref 0.0–0.5)
EOS%: 1.5 % (ref 0.0–7.0)
HEMATOCRIT: 31.5 % — AB (ref 34.8–46.6)
HGB: 10.7 g/dL — ABNORMAL LOW (ref 11.6–15.9)
LYMPH#: 1.1 10*3/uL (ref 0.9–3.3)
LYMPH%: 20.4 % (ref 14.0–49.7)
MCH: 31.9 pg (ref 25.1–34.0)
MCHC: 34 g/dL (ref 31.5–36.0)
MCV: 93.8 fL (ref 79.5–101.0)
MONO#: 0.4 10*3/uL (ref 0.1–0.9)
MONO%: 7.3 % (ref 0.0–14.0)
NEUT#: 3.9 10*3/uL (ref 1.5–6.5)
NEUT%: 70.1 % (ref 38.4–76.8)
PLATELETS: 131 10*3/uL — AB (ref 145–400)
RBC: 3.36 10*6/uL — ABNORMAL LOW (ref 3.70–5.45)
RDW: 16.9 % — ABNORMAL HIGH (ref 11.2–14.5)
WBC: 5.5 10*3/uL (ref 3.9–10.3)

## 2016-11-19 MED ORDER — DEXTROSE 5 % IV SOLN
Freq: Once | INTRAVENOUS | Status: AC
  Administered 2016-11-19: 10:00:00 via INTRAVENOUS

## 2016-11-19 MED ORDER — PALONOSETRON HCL INJECTION 0.25 MG/5ML
INTRAVENOUS | Status: AC
  Filled 2016-11-19: qty 5

## 2016-11-19 MED ORDER — PALONOSETRON HCL INJECTION 0.25 MG/5ML
0.2500 mg | Freq: Once | INTRAVENOUS | Status: AC
  Administered 2016-11-19: 0.25 mg via INTRAVENOUS

## 2016-11-19 MED ORDER — SODIUM CHLORIDE 0.9 % IV SOLN
Freq: Once | INTRAVENOUS | Status: AC
  Administered 2016-11-19: 10:00:00 via INTRAVENOUS
  Filled 2016-11-19: qty 5

## 2016-11-19 MED ORDER — LEUCOVORIN CALCIUM INJECTION 350 MG
400.0000 mg/m2 | Freq: Once | INTRAVENOUS | Status: AC
  Administered 2016-11-19: 752 mg via INTRAVENOUS
  Filled 2016-11-19: qty 37.6

## 2016-11-19 MED ORDER — OXALIPLATIN CHEMO INJECTION 100 MG/20ML
85.0000 mg/m2 | Freq: Once | INTRAVENOUS | Status: AC
  Administered 2016-11-19: 160 mg via INTRAVENOUS
  Filled 2016-11-19: qty 32

## 2016-11-19 MED ORDER — FLUOROURACIL CHEMO INJECTION 5 GM/100ML
1800.0000 mg/m2 | INTRAVENOUS | Status: DC
  Administered 2016-11-19: 3400 mg via INTRAVENOUS
  Filled 2016-11-19: qty 68

## 2016-11-19 NOTE — Telephone Encounter (Signed)
No LOS per 11/19/16 visit.  2 bottles of contrast and a copy of instructions was given to patient, per CT ordered. 11/19/16

## 2016-11-19 NOTE — Addendum Note (Signed)
Addended by: Betsy Coder B on: 11/19/2016 09:34 AM   Modules accepted: Orders

## 2016-11-19 NOTE — Patient Instructions (Signed)
[facility] Discharge Instructions for Patients Receiving Chemotherapy  Today you received the following chemotherapy agents Oxaliplatin, Leucovorin, and 5 FU  To help prevent nausea and vomiting after your treatment, we encourage you to take your nausea medication as directed If you develop nausea and vomiting that is not controlled by your nausea medication, call the clinic.   BELOW ARE SYMPTOMS THAT SHOULD BE REPORTED IMMEDIATELY:  *FEVER GREATER THAN 100.5 F  *CHILLS WITH OR WITHOUT FEVER  NAUSEA AND VOMITING THAT IS NOT CONTROLLED WITH YOUR NAUSEA MEDICATION  *UNUSUAL SHORTNESS OF BREATH  *UNUSUAL BRUISING OR BLEEDING  TENDERNESS IN MOUTH AND THROAT WITH OR WITHOUT PRESENCE OF ULCERS  *URINARY PROBLEMS  *BOWEL PROBLEMS  UNUSUAL RASH Items with * indicate a potential emergency and should be followed up as soon as possible.  Feel free to call the clinic you have any questions or concerns. The clinic phone number is [phone].  Please show the CHEMO ALERT CARD at check-in to the Emergency Department and triage nurse.   

## 2016-11-19 NOTE — Patient Instructions (Signed)
Implanted Port Home Guide An implanted port is a type of central line that is placed under the skin. Central lines are used to provide IV access when treatment or nutrition needs to be given through a person's veins. Implanted ports are used for long-term IV access. An implanted port may be placed because:   You need IV medicine that would be irritating to the small veins in your hands or arms.   You need long-term IV medicines, such as antibiotics.   You need IV nutrition for a long period.   You need frequent blood draws for lab tests.   You need dialysis.  Implanted ports are usually placed in the chest area, but they can also be placed in the upper arm, the abdomen, or the leg. An implanted port has two main parts:   Reservoir. The reservoir is round and will appear as a small, raised area under your skin. The reservoir is the part where a needle is inserted to give medicines or draw blood.   Catheter. The catheter is a thin, flexible tube that extends from the reservoir. The catheter is placed into a large vein. Medicine that is inserted into the reservoir goes into the catheter and then into the vein.  HOW WILL I CARE FOR MY INCISION SITE? Do not get the incision site wet. Bathe or shower as directed by your health care provider.  HOW IS MY PORT ACCESSED? Special steps must be taken to access the port:   Before the port is accessed, a numbing cream can be placed on the skin. This helps numb the skin over the port site.   Your health care provider uses a sterile technique to access the port.  Your health care provider must put on a mask and sterile gloves.  The skin over your port is cleaned carefully with an antiseptic and allowed to dry.  The port is gently pinched between sterile gloves, and a needle is inserted into the port.  Only "non-coring" port needles should be used to access the port. Once the port is accessed, a blood return should be checked. This helps  ensure that the port is in the vein and is not clogged.   If your port needs to remain accessed for a constant infusion, a clear (transparent) bandage will be placed over the needle site. The bandage and needle will need to be changed every week, or as directed by your health care provider.   Keep the bandage covering the needle clean and dry. Do not get it wet. Follow your health care provider's instructions on how to take a shower or bath while the port is accessed.   If your port does not need to stay accessed, no bandage is needed over the port.  WHAT IS FLUSHING? Flushing helps keep the port from getting clogged. Follow your health care provider's instructions on how and when to flush the port. Ports are usually flushed with saline solution or a medicine called heparin. The need for flushing will depend on how the port is used.   If the port is used for intermittent medicines or blood draws, the port will need to be flushed:   After medicines have been given.   After blood has been drawn.   As part of routine maintenance.   If a constant infusion is running, the port may not need to be flushed.  HOW LONG WILL MY PORT STAY IMPLANTED? The port can stay in for as long as your health care   provider thinks it is needed. When it is time for the port to come out, surgery will be done to remove it. The procedure is similar to the one performed when the port was put in.  WHEN SHOULD I SEEK IMMEDIATE MEDICAL CARE? When you have an implanted port, you should seek immediate medical care if:   You notice a bad smell coming from the incision site.   You have swelling, redness, or drainage at the incision site.   You have more swelling or pain at the port site or the surrounding area.   You have a fever that is not controlled with medicine. This information is not intended to replace advice given to you by your health care provider. Make sure you discuss any questions you have with  your health care provider. Document Released: 09/09/2005 Document Revised: 06/30/2013 Document Reviewed: 05/17/2013 Elsevier Interactive Patient Education  2017 [place].  Pegfilgrastim injection What is this medicine? PEGFILGRASTIM (PEG fil gra stim) is a long-acting granulocyte colony-stimulating factor that stimulates the growth of neutrophils, a type of white blood cell important in the body's fight against infection. It is used to reduce the incidence of fever and infection in patients with certain types of cancer who are receiving chemotherapy that affects the bone marrow, and to increase survival after being exposed to high doses of radiation. This medicine may be used for other purposes; ask your health care provider or pharmacist if you have questions. COMMON BRAND NAME(S): Neulasta What should I tell my health care provider before I take this medicine? They need to know if you have any of these conditions: -kidney disease -latex allergy -ongoing radiation therapy -sickle cell disease -skin reactions to acrylic adhesives (On-Body Injector only) -an unusual or allergic reaction to pegfilgrastim, filgrastim, other medicines, foods, dyes, or preservatives -pregnant or trying to get pregnant -breast-feeding How should I use this medicine? This medicine is for injection under the skin. If you get this medicine at home, you will be taught how to prepare and give the pre-filled syringe or how to use the On-body Injector. Refer to the patient Instructions for Use for detailed instructions. Use exactly as directed. Take your medicine at regular intervals. Do not take your medicine more often than directed. It is important that you put your used needles and syringes in a special sharps container. Do not put them in a trash can. If you do not have a sharps container, call your pharmacist or healthcare provider to get one. Talk to your pediatrician regarding the use of this medicine in  children. While this drug may be prescribed for selected conditions, precautions do apply. Overdosage: If you think you have taken too much of this medicine contact a poison control center or emergency room at once. NOTE: This medicine is only for you. Do not share this medicine with others. What if I miss a dose? It is important not to miss your dose. Call your doctor or health care professional if you miss your dose. If you miss a dose due to an On-body Injector failure or leakage, a new dose should be administered as soon as possible using a single prefilled syringe for manual use. What may interact with this medicine? Interactions have not been studied. Give your health care provider a list of all the medicines, herbs, non-prescription drugs, or dietary supplements you use. Also tell them if you smoke, drink alcohol, or use illegal drugs. Some items may interact with your medicine. This list may not describe  all possible interactions. Give your health care provider a list of all the medicines, herbs, non-prescription drugs, or dietary supplements you use. Also tell them if you smoke, drink alcohol, or use illegal drugs. Some items may interact with your medicine. What should I watch for while using this medicine? You may need blood work done while you are taking this medicine. If you are going to need a MRI, CT scan, or other procedure, tell your doctor that you are using this medicine (On-Body Injector only). What side effects may I notice from receiving this medicine? Side effects that you should report to your doctor or health care professional as soon as possible: -allergic reactions like skin rash, itching or hives, swelling of the face, lips, or tongue -dizziness -fever -pain, redness, or irritation at site where injected -pinpoint red spots on the skin -red or dark-brown urine -shortness of breath or breathing problems -stomach or side pain, or pain at the  shoulder -swelling -tiredness -trouble passing urine or change in the amount of urine Side effects that usually do not require medical attention (report to your doctor or health care professional if they continue or are bothersome): -bone pain -muscle pain This list may not describe all possible side effects. Call your doctor for medical advice about side effects. You may report side effects to FDA at 1-800-FDA-1088. Where should I keep my medicine? Keep out of the reach of children. Store pre-filled syringes in a refrigerator between 2 and 8 degrees C (36 and 46 degrees F). Do not freeze. Keep in carton to protect from light. Throw away this medicine if it is left out of the refrigerator for more than 48 hours. Throw away any unused medicine after the expiration date. NOTE: This sheet is a summary. It may not cover all possible information. If you have questions about this medicine, talk to your doctor, pharmacist, or health care provider.  2017 Elsevier/Gold Standard (2014-09-29 14:30:14)

## 2016-11-19 NOTE — Progress Notes (Signed)
[place] OFFICE PROGRESS NOTE   Diagnosis:  Pancreas cancer  INTERVAL HISTORY:   [name] returns as scheduled. She completed cycle 4 FOLFOX 11/05/2016. She has intermittent mild nausea. No mouth sores. She had loose stools for a few days controlled with Imodium. Cold sensitivity has resolved. No persistent neuropathy symptoms. Abdominal pain continues to be improved. She takes hydrocodone as needed.  Objective:  Vital signs in last 24 hours:  Blood pressure 125/62, pulse 65, temperature 98.3 F (36.8 C), temperature source Oral, resp. rate 17, weight 171 lb 6.4 oz (77.7 kg), SpO2 99 %.    HEENT: No thrush or ulcers. Resp: Lungs clear bilaterally. Cardio: Regular rate and rhythm. GI: Abdomen is soft. No hepatomegaly. Tender at the upper abdomen. Vascular: No leg edema. Neuro: Vibratory sense mildly decreased over the fingertips per tuning fork exam.  Skin: No rash. Port-A-Cath without erythema.    Lab Results:  Lab Results  Component Value Date   WBC 5.5 11/19/2016   HGB 10.7 (L) 11/19/2016   HCT 31.5 (L) 11/19/2016   MCV 93.8 11/19/2016   PLT 131 (L) 11/19/2016   NEUTROABS 3.9 11/19/2016    Imaging:  No results found.  Medications: I have reviewed the patient's current medications.  Assessment/Plan: 1. 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 Elevated CA 19-9 ? CT chest 08/04/2014-negative for metastatic disease ? Pancreaticoduodenectomy 08/30/2014, stage II (T3 N0) moderately differential adenocarcinoma, negative resection margins (1 mm retroperitoneal margin) ? Initiation of adjuvant gemcitabine 10/26/2014. ? Gemcitabine held 11/02/2014 due to neutropenia. ? Gemcitabine 11/09/2014 dose reduced 800 mg/m. ? Gemcitabine held 11/16/2014 due to neutropenia. ? Gemcitabine resumed 11/23/2014 every 2 week schedule. ? Cycle 6 gemcitabine 01/05/2015 ? Cycle 7 gemcitabine  01/18/2015 ? Cycle 8 gemcitabine 02/01/2015 ? Cycle 9 gemcitabine 02/15/2015 ? Cycle 10 gemcitabine 03/01/2015 ? Cycle 11 gemcitabine 03/15/2015 ? Cycle 12 gemcitabine 03/29/2015 ? Elevated CA 19-01 May 2016 ? CTs 06/17/2016-new soft tissue mass at the root of the mesentery with vascular involvement ? PET 06/27/2016-hypermetabolic activity associated with soft tissue adjacent to surgical clips in the central mesentery ? Status post SBRTto the mesenteric mass completed 07/26/2016 ? CT abdomen/pelvis 08/23/2016 -mesenteric mass stable to slightly decreased in size. ? Cycle 1 FOLFOX 09/03/2016 ? Cycle 2 FOLFOX 09/30/2016 ? Cycle 3 FOLFOX 10/14/2016  ? Cycle 4 FOLFOX 11/05/2016 (5-FU bolus eliminated and 5-FU pump dose reduced) ? Cycle 5 FOLFOX 11/19/2016  2. 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 Admission with post ERCP pancreatitis 06/16/2014  4. History of abdominal pain secondary to #1  5. Pulmonary embolism diagnosed on a CT of the abdomen 09/16/2014  Negative lower extremity Dopplers 09/17/2014  6. Multiple orthopedic surgical procedures  7. Endometrial cancer,stage IA, grade 1 endometrioid adenocarcinoma, 18% myometrial invasion, no lymphovascular space involvement, negative washings  Status post robotic total hysterectomy and bilateral salpingo-oophorectomy 11/30/2010  Recurrent tumor left lateral vagina status post biopsy 11/24/2014 with pathology confirming adenocarcinoma with focal squamous differentiation consistent with endometrial adenocarcinoma  Staging CT scans 12/06/2014 with no evidence of local pancreatic cancer recurrence. Small fluid collection adjacent to the left adrenal gland. Severe hepatic steatosis. No evidence of local extension of endometrial carcinoma. Carcinoma not well-defined at the  vaginal cuff. 5 mm right external iliac lymph node. 3.6 mm left external iliac  lymph node  Brachytherapy initiated 12/22/2014, completed 01/19/2015  CT abdomen/pelvis 07/24/2015 revealed a 3 x 4 cm soft tissue focus at the vaginal apex  PET scan 08/11/2015 revealed no mass at the vaginal apex and no evidence of metastatic disease  8. History of atrial fibrillation-maintained on xarelto  9. Family history of multiple cancers-negative CancerNext gene panel  10. Prolonged nausea following the pancreaticoduodenectomy. Improved 10/26/2014.  11. Port-A-Cath placement 10/21/2014.  12. History of Neutropenia secondary to chemotherapy   13. Diarrhea. Question pancreatic insufficiency. Pancreatic enzyme replacement initiated 01/05/2015. Recurrent diarrhea following a course of antibiotics March 2017.  14. History of positional vertigo-resolved  15. Pain-abdominaland back pain-likely secondary to the mesenteric mass  16. Neutropenia secondary to chemotherapy 09/18/2016.  17. Delayed nausea and diarrhea following FOLFOX. Emend added with cycle 2. Decadron prophylaxis added with cycle 3  18. Diarrhea 10/28/2016. Question related to chemotherapy. Negative C. difficile testing 10/31/2016.   Disposition: Ms. Mcrorie appears stable. She has completed 4 cycles of FOLFOX. She noted improvement tolerance with the 5-FU adjustments last cycle. Plan to continue the same. She will proceed with cycle 5 today as scheduled. She will have restaging CT scans just prior to her next visit in 2 weeks.  She will return for a follow-up visit on 12/03/2016. She will contact the office in the interim with any problems.    Ned Card ANP/GNP-BC   11/19/2016  8:46 AM

## 2016-11-19 NOTE — Telephone Encounter (Signed)
A copy of the AVS report and appointment schedule was given to the patient, per 11/19/16 visit.

## 2016-11-20 LAB — CANCER ANTIGEN 19-9: CAN 19-9: 81 U/mL — AB (ref 0–35)

## 2016-11-21 ENCOUNTER — Ambulatory Visit: Payer: Medicare Other

## 2016-11-21 ENCOUNTER — Ambulatory Visit (HOSPITAL_BASED_OUTPATIENT_CLINIC_OR_DEPARTMENT_OTHER): Payer: Medicare Other

## 2016-11-21 VITALS — BP 101/53 | HR 48 | Temp 98.2°F | Resp 18

## 2016-11-21 DIAGNOSIS — C250 Malignant neoplasm of head of pancreas: Secondary | ICD-10-CM

## 2016-11-21 DIAGNOSIS — Z5189 Encounter for other specified aftercare: Secondary | ICD-10-CM

## 2016-11-21 MED ORDER — SODIUM CHLORIDE 0.9% FLUSH
10.0000 mL | INTRAVENOUS | Status: DC | PRN
  Administered 2016-11-21: 10 mL
  Filled 2016-11-21: qty 10

## 2016-11-21 MED ORDER — PEGFILGRASTIM INJECTION 6 MG/0.6ML ~~LOC~~
6.0000 mg | PREFILLED_SYRINGE | Freq: Once | SUBCUTANEOUS | Status: AC
  Administered 2016-11-21: 6 mg via SUBCUTANEOUS
  Filled 2016-11-21: qty 0.6

## 2016-11-21 MED ORDER — HEPARIN SOD (PORK) LOCK FLUSH 100 UNIT/ML IV SOLN
500.0000 [IU] | Freq: Once | INTRAVENOUS | Status: AC | PRN
  Administered 2016-11-21: 500 [IU]
  Filled 2016-11-21: qty 5

## 2016-11-21 NOTE — Patient Instructions (Signed)
Implanted Port Home Guide An implanted port is a type of central line that is placed under the skin. Central lines are used to provide IV access when treatment or nutrition needs to be given through a person's veins. Implanted ports are used for long-term IV access. An implanted port may be placed because:   You need IV medicine that would be irritating to the small veins in your hands or arms.   You need long-term IV medicines, such as antibiotics.   You need IV nutrition for a long period.   You need frequent blood draws for lab tests.   You need dialysis.  Implanted ports are usually placed in the chest area, but they can also be placed in the upper arm, the abdomen, or the leg. An implanted port has two main parts:   Reservoir. The reservoir is round and will appear as a small, raised area under your skin. The reservoir is the part where a needle is inserted to give medicines or draw blood.   Catheter. The catheter is a thin, flexible tube that extends from the reservoir. The catheter is placed into a large vein. Medicine that is inserted into the reservoir goes into the catheter and then into the vein.  HOW WILL I CARE FOR MY INCISION SITE? Do not get the incision site wet. Bathe or shower as directed by your health care provider.  HOW IS MY PORT ACCESSED? Special steps must be taken to access the port:   Before the port is accessed, a numbing cream can be placed on the skin. This helps numb the skin over the port site.   Your health care provider uses a sterile technique to access the port.  Your health care provider must put on a mask and sterile gloves.  The skin over your port is cleaned carefully with an antiseptic and allowed to dry.  The port is gently pinched between sterile gloves, and a needle is inserted into the port.  Only "non-coring" port needles should be used to access the port. Once the port is accessed, a blood return should be checked. This helps  ensure that the port is in the vein and is not clogged.   If your port needs to remain accessed for a constant infusion, a clear (transparent) bandage will be placed over the needle site. The bandage and needle will need to be changed every week, or as directed by your health care provider.   Keep the bandage covering the needle clean and dry. Do not get it wet. Follow your health care provider's instructions on how to take a shower or bath while the port is accessed.   If your port does not need to stay accessed, no bandage is needed over the port.  WHAT IS FLUSHING? Flushing helps keep the port from getting clogged. Follow your health care provider's instructions on how and when to flush the port. Ports are usually flushed with saline solution or a medicine called heparin. The need for flushing will depend on how the port is used.   If the port is used for intermittent medicines or blood draws, the port will need to be flushed:   After medicines have been given.   After blood has been drawn.   As part of routine maintenance.   If a constant infusion is running, the port may not need to be flushed.  HOW LONG WILL MY PORT STAY IMPLANTED? The port can stay in for as long as your health care   provider thinks it is needed. When it is time for the port to come out, surgery will be done to remove it. The procedure is similar to the one performed when the port was put in.  WHEN SHOULD I SEEK IMMEDIATE MEDICAL CARE? When you have an implanted port, you should seek immediate medical care if:   You notice a bad smell coming from the incision site.   You have swelling, redness, or drainage at the incision site.   You have more swelling or pain at the port site or the surrounding area.   You have a fever that is not controlled with medicine. This information is not intended to replace advice given to you by your health care provider. Make sure you discuss any questions you have with  your health care provider. Document Released: 09/09/2005 Document Revised: 06/30/2013 Document Reviewed: 05/17/2013 Elsevier Interactive Patient Education  2017 [place].  Pegfilgrastim injection What is this medicine? PEGFILGRASTIM (PEG fil gra stim) is a long-acting granulocyte colony-stimulating factor that stimulates the growth of neutrophils, a type of white blood cell important in the body's fight against infection. It is used to reduce the incidence of fever and infection in patients with certain types of cancer who are receiving chemotherapy that affects the bone marrow, and to increase survival after being exposed to high doses of radiation. This medicine may be used for other purposes; ask your health care provider or pharmacist if you have questions. COMMON BRAND NAME(S): Neulasta What should I tell my health care provider before I take this medicine? They need to know if you have any of these conditions: -kidney disease -latex allergy -ongoing radiation therapy -sickle cell disease -skin reactions to acrylic adhesives (On-Body Injector only) -an unusual or allergic reaction to pegfilgrastim, filgrastim, other medicines, foods, dyes, or preservatives -pregnant or trying to get pregnant -breast-feeding How should I use this medicine? This medicine is for injection under the skin. If you get this medicine at home, you will be taught how to prepare and give the pre-filled syringe or how to use the On-body Injector. Refer to the patient Instructions for Use for detailed instructions. Use exactly as directed. Take your medicine at regular intervals. Do not take your medicine more often than directed. It is important that you put your used needles and syringes in a special sharps container. Do not put them in a trash can. If you do not have a sharps container, call your pharmacist or healthcare provider to get one. Talk to your pediatrician regarding the use of this medicine in  children. While this drug may be prescribed for selected conditions, precautions do apply. Overdosage: If you think you have taken too much of this medicine contact a poison control center or emergency room at once. NOTE: This medicine is only for you. Do not share this medicine with others. What if I miss a dose? It is important not to miss your dose. Call your doctor or health care professional if you miss your dose. If you miss a dose due to an On-body Injector failure or leakage, a new dose should be administered as soon as possible using a single prefilled syringe for manual use. What may interact with this medicine? Interactions have not been studied. Give your health care provider a list of all the medicines, herbs, non-prescription drugs, or dietary supplements you use. Also tell them if you smoke, drink alcohol, or use illegal drugs. Some items may interact with your medicine. This list may not describe  all possible interactions. Give your health care provider a list of all the medicines, herbs, non-prescription drugs, or dietary supplements you use. Also tell them if you smoke, drink alcohol, or use illegal drugs. Some items may interact with your medicine. What should I watch for while using this medicine? You may need blood work done while you are taking this medicine. If you are going to need a MRI, CT scan, or other procedure, tell your doctor that you are using this medicine (On-Body Injector only). What side effects may I notice from receiving this medicine? Side effects that you should report to your doctor or health care professional as soon as possible: -allergic reactions like skin rash, itching or hives, swelling of the face, lips, or tongue -dizziness -fever -pain, redness, or irritation at site where injected -pinpoint red spots on the skin -red or dark-brown urine -shortness of breath or breathing problems -stomach or side pain, or pain at the  shoulder -swelling -tiredness -trouble passing urine or change in the amount of urine Side effects that usually do not require medical attention (report to your doctor or health care professional if they continue or are bothersome): -bone pain -muscle pain This list may not describe all possible side effects. Call your doctor for medical advice about side effects. You may report side effects to FDA at 1-800-FDA-1088. Where should I keep my medicine? Keep out of the reach of children. Store pre-filled syringes in a refrigerator between 2 and 8 degrees C (36 and 46 degrees F). Do not freeze. Keep in carton to protect from light. Throw away this medicine if it is left out of the refrigerator for more than 48 hours. Throw away any unused medicine after the expiration date. NOTE: This sheet is a summary. It may not cover all possible information. If you have questions about this medicine, talk to your doctor, pharmacist, or health care provider.  2017 Elsevier/Gold Standard (2014-09-29 14:30:14)

## 2016-11-26 ENCOUNTER — Encounter: Payer: Self-pay | Admitting: Oncology

## 2016-11-26 ENCOUNTER — Encounter: Payer: Self-pay | Admitting: Nurse Practitioner

## 2016-11-28 ENCOUNTER — Telehealth: Payer: Self-pay

## 2016-11-28 ENCOUNTER — Ambulatory Visit (HOSPITAL_BASED_OUTPATIENT_CLINIC_OR_DEPARTMENT_OTHER): Payer: Medicare Other

## 2016-11-28 ENCOUNTER — Other Ambulatory Visit: Payer: Self-pay | Admitting: Nurse Practitioner

## 2016-11-28 ENCOUNTER — Ambulatory Visit (HOSPITAL_COMMUNITY)
Admission: RE | Admit: 2016-11-28 | Discharge: 2016-11-28 | Disposition: A | Discharge status: Home / Self Care | Payer: Medicare Other | Source: Ambulatory Visit | Attending: Nurse Practitioner | Admitting: Nurse Practitioner

## 2016-11-28 ENCOUNTER — Encounter: Payer: Self-pay | Admitting: Nurse Practitioner

## 2016-11-28 ENCOUNTER — Ambulatory Visit (HOSPITAL_BASED_OUTPATIENT_CLINIC_OR_DEPARTMENT_OTHER): Payer: Medicare Other | Admitting: Nurse Practitioner

## 2016-11-28 ENCOUNTER — Other Ambulatory Visit (HOSPITAL_COMMUNITY)
Admission: AD | Admit: 2016-11-28 | Discharge: 2016-11-28 | Disposition: A | Discharge status: Home / Self Care | Payer: Medicare Other | Source: Ambulatory Visit | Attending: Nurse Practitioner | Admitting: Nurse Practitioner

## 2016-11-28 ENCOUNTER — Other Ambulatory Visit: Payer: Self-pay

## 2016-11-28 ENCOUNTER — Encounter (HOSPITAL_COMMUNITY): Payer: Self-pay

## 2016-11-28 VITALS — BP 154/65 | HR 70 | Temp 98.4°F | Resp 16 | Wt 173.6 lb

## 2016-11-28 DIAGNOSIS — C250 Malignant neoplasm of head of pancreas: Secondary | ICD-10-CM

## 2016-11-28 DIAGNOSIS — Z90710 Acquired absence of both cervix and uterus: Secondary | ICD-10-CM | POA: Diagnosis not present

## 2016-11-28 DIAGNOSIS — E860 Dehydration: Secondary | ICD-10-CM

## 2016-11-28 DIAGNOSIS — Z9889 Other specified postprocedural states: Secondary | ICD-10-CM | POA: Diagnosis not present

## 2016-11-28 DIAGNOSIS — Z8542 Personal history of malignant neoplasm of other parts of uterus: Secondary | ICD-10-CM | POA: Insufficient documentation

## 2016-11-28 DIAGNOSIS — C259 Malignant neoplasm of pancreas, unspecified: Secondary | ICD-10-CM | POA: Diagnosis not present

## 2016-11-28 DIAGNOSIS — R197 Diarrhea, unspecified: Secondary | ICD-10-CM | POA: Diagnosis not present

## 2016-11-28 LAB — COMPREHENSIVE METABOLIC PANEL
ALBUMIN: 3.7 g/dL (ref 3.5–5.0)
ALK PHOS: 210 U/L — AB (ref 40–150)
ALT: 34 U/L (ref 0–55)
ANION GAP: 8 meq/L (ref 3–11)
AST: 44 U/L — ABNORMAL HIGH (ref 5–34)
BILIRUBIN TOTAL: 0.37 mg/dL (ref 0.20–1.20)
BUN: 6 mg/dL — ABNORMAL LOW (ref 7.0–26.0)
CO2: 26 mEq/L (ref 22–29)
CREATININE: 0.7 mg/dL (ref 0.6–1.1)
Calcium: 9.1 mg/dL (ref 8.4–10.4)
Chloride: 107 mEq/L (ref 98–109)
EGFR: 82 mL/min/{1.73_m2} — AB (ref >90)
GLUCOSE: 167 mg/dL — AB (ref 70–140)
Potassium: 3.6 mEq/L (ref 3.5–5.1)
Sodium: 141 mEq/L (ref 136–145)
TOTAL PROTEIN: 6.5 g/dL (ref 6.4–8.3)

## 2016-11-28 LAB — CBC WITH DIFFERENTIAL/PLATELET
BASO%: 0.2 % (ref 0.0–2.0)
Basophils Absolute: 0 10e3/uL (ref 0.0–0.1)
EOS%: 1.3 % (ref 0.0–7.0)
Eosinophils Absolute: 0.2 10e3/uL (ref 0.0–0.5)
HCT: 30.7 % — ABNORMAL LOW (ref 34.8–46.6)
HGB: 10.3 g/dL — ABNORMAL LOW (ref 11.6–15.9)
LYMPH%: 12.2 % — ABNORMAL LOW (ref 14.0–49.7)
MCH: 31.6 pg (ref 25.1–34.0)
MCHC: 33.6 g/dL (ref 31.5–36.0)
MCV: 94.2 fL (ref 79.5–101.0)
MONO#: 1.2 10e3/uL — ABNORMAL HIGH (ref 0.1–0.9)
MONO%: 9.8 % (ref 0.0–14.0)
NEUT#: 9.5 10e3/uL — ABNORMAL HIGH (ref 1.5–6.5)
NEUT%: 76.5 % (ref 38.4–76.8)
Platelets: 134 10e3/uL — ABNORMAL LOW (ref 145–400)
RBC: 3.26 10e6/uL — ABNORMAL LOW (ref 3.70–5.45)
RDW: 16.4 % — ABNORMAL HIGH (ref 11.2–14.5)
WBC: 12.4 10e3/uL — ABNORMAL HIGH (ref 3.9–10.3)
lymph#: 1.5 10e3/uL (ref 0.9–3.3)

## 2016-11-28 LAB — C DIFFICILE QUICK SCREEN W PCR REFLEX
C DIFFICILE (CDIFF) TOXIN: NEGATIVE
C Diff antigen: NEGATIVE
C Diff interpretation: NOT DETECTED

## 2016-11-28 LAB — MAGNESIUM: Magnesium: 2.1 mg/dL (ref 1.5–2.5)

## 2016-11-28 MED ORDER — HEPARIN SOD (PORK) LOCK FLUSH 100 UNIT/ML IV SOLN: 500.0000 [IU] | Freq: Once | INTRAVENOUS | Status: DC

## 2016-11-28 MED ORDER — HEPARIN SOD (PORK) LOCK FLUSH 100 UNIT/ML IV SOLN
INTRAVENOUS | Status: AC
  Filled 2016-11-28: qty 5

## 2016-11-28 MED ORDER — HEPARIN SOD (PORK) LOCK FLUSH 100 UNIT/ML IV SOLN
500.0000 [IU] | Freq: Once | INTRAVENOUS | Status: AC
  Administered 2016-11-28: 500 [IU] via INTRAVENOUS
  Filled 2016-11-28: qty 5

## 2016-11-28 MED ORDER — IOPAMIDOL (ISOVUE-300) INJECTION 61%
100.0000 mL | Freq: Once | INTRAVENOUS | Status: AC | PRN
  Administered 2016-11-28: 100 mL via INTRAVENOUS

## 2016-11-28 MED ORDER — LOPERAMIDE HCL 2 MG PO CAPS
ORAL_CAPSULE | ORAL | Status: AC
  Filled 2016-11-28: qty 2

## 2016-11-28 MED ORDER — LOPERAMIDE HCL 2 MG PO TABS
4.0000 mg | ORAL_TABLET | Freq: Once | ORAL | Status: AC
  Administered 2016-11-28: 4 mg via ORAL
  Filled 2016-11-28: qty 2

## 2016-11-28 MED ORDER — SODIUM CHLORIDE 0.9% FLUSH
10.0000 mL | Freq: Once | INTRAVENOUS | Status: AC
  Administered 2016-11-28: 10 mL via INTRAVENOUS
  Filled 2016-11-28: qty 10

## 2016-11-28 MED ORDER — SODIUM CHLORIDE 0.9 % IV SOLN
INTRAVENOUS | Status: AC
  Administered 2016-11-28: 14:00:00 via INTRAVENOUS

## 2016-11-28 MED ORDER — IOPAMIDOL (ISOVUE-300) INJECTION 61%
INTRAVENOUS | Status: AC
  Filled 2016-11-28: qty 100

## 2016-11-28 NOTE — Assessment & Plan Note (Signed)
Patient received cycle 5 of Folfiri fear of chemotherapy on 11/19/2016.  She presented to the [facility] today with complaint of diarrhea and dehydration.  See further notes for details of today's visit.  Also, patient underwent a restaging CT with contrast of the abdomen/pelvis today which revealed:  IMPRESSION: Postop changes related to Whipple procedure.  Abnormal soft tissue along the root of the small bowel mesentery, suspicious for recurrence, now measuring 2.9 x 2.2 cm, decreased.  10 mm short axis node in the porta hepatis, indeterminate, unchanged.  Status post hysterectomy. Mildly asymmetric soft tissue along the left vaginal cuff, equivocal, new. Attention on follow-up is suggested given the history of recurrent endometrial cancer.   Electronically Signed   By: Julian Hy M.D.   On: 11/28/2016 09:04   Dr. [name] in to review CT results with both the patient and her husband today.  Patient will be scheduled to return for labs, flush, visit, next cycle of chemotherapy on 12/03/2016.

## 2016-11-28 NOTE — Assessment & Plan Note (Signed)
Patient has been having frequent diarrhea for the past 3 days.  Are currently awaiting the stool for C. difficile results.  Patient will receive IV fluid rehydration while she is at the [facility] today.  She may very likely require additional IV fluid rehydration.  If diarrhea continues.  She was also encouraged to push fluids is much as possible.

## 2016-11-28 NOTE — Assessment & Plan Note (Signed)
Patient received cycle 5 of her for fear knots chemotherapy regimen on 11/19/2016.  She presented to the [facility] today with complaint of frequent diarrhea for the past 3 days.  She states she's having greater than 10 loose, watery stools each day.  She has been taking Lomotil 2 tablets at a time with minimal effectiveness.  She feels dehydrated and tired today.  She denies any other new symptoms whatsoever.  She denies any recent fevers or chills.  Exam today reveals patient does appear slightly weak and dehydrated.  Vital signs are stable and patient is afebrile.  All labs were essentially within normal limits; and currently awaiting the stool for C. difficile results.  Patient was given Imodium 2 tablets while at the [facility] today.  Patient will receive IV fluid rehydration today.  She was also encouraged to alternate both the Imodium with the Lomotil to see if that helps.  She was advised to try a clear liquid diet that can be advanced as tolerated to a bland diet.  Patient may also need additional IV fluid rehydration.  If she continues with diarrhea.

## 2016-11-28 NOTE — Telephone Encounter (Signed)
Pt called stating she has diarrhea for the last 3 days. She has had 10 so far today and they are clear water. She has been using lomotil with no help. She had problems at CT today with diarrhea. Last folfox 2/27.  Labs placed for cbc, cmet, c-diff inbasket sent to scheduler

## 2016-11-28 NOTE — Progress Notes (Signed)
Dr. [name] in to see pt at end of infusion.  Pt to come back again tomorrow @ 11am for additional IV Fluids
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
## 2016-11-28 NOTE — Patient Instructions (Signed)
Dehydration, Adult Dehydration is a condition in which there is not enough fluid or water in the body. This happens when you lose more fluids than you take in. Important organs, such as the kidneys, brain, and heart, cannot function without a proper amount of fluids. Any loss of fluids from the body can lead to dehydration. Dehydration can range from mild to severe. This condition should be treated right away to prevent it from becoming severe. What are the causes? This condition may be caused by:  Vomiting.  Diarrhea.  Excessive sweating, such as from heat exposure or exercise.  Not drinking enough fluid, especially:  When ill.  While doing activity that requires a lot of energy.  Excessive urination.  Fever.  Infection.  Certain medicines, such as medicines that cause the body to lose excess fluid (diuretics).  Inability to access safe drinking water.  Reduced physical ability to get adequate water and food. What increases the risk? This condition is more likely to develop in people:  Who have a poorly controlled long-term (chronic) illness, such as diabetes, heart disease, or kidney disease.  Who are age 65 or older.  Who are disabled.  Who live in a place with high altitude.  Who play endurance sports. What are the signs or symptoms? Symptoms of mild dehydration may include:   Thirst.  Dry lips.  Slightly dry mouth.  Dry, warm skin.  Dizziness. Symptoms of moderate dehydration may include:   Very dry mouth.  Muscle cramps.  Dark urine. Urine may be the color of tea.  Decreased urine production.  Decreased tear production.  Heartbeat that is irregular or faster than normal (palpitations).  Headache.  Light-headedness, especially when you stand up from a sitting position.  Fainting (syncope). Symptoms of severe dehydration may include:   Changes in skin, such as:  Cold and clammy skin.  Blotchy (mottled) or pale skin.  Skin that does  not quickly return to normal after being lightly pinched and released (poor skin turgor).  Changes in body fluids, such as:  Extreme thirst.  No tear production.  Inability to sweat when body temperature is high, such as in hot weather.  Very little urine production.  Changes in vital signs, such as:  Weak pulse.  Pulse that is more than 100 beats a minute when sitting still.  Rapid breathing.  Low blood pressure.  Other changes, such as:  Sunken eyes.  Cold hands and feet.  Confusion.  Lack of energy (lethargy).  Difficulty waking up from sleep.  Short-term weight loss.  Unconsciousness. How is this diagnosed? This condition is diagnosed based on your symptoms and a physical exam. Blood and urine tests may be done to help confirm the diagnosis. How is this treated? Treatment for this condition depends on the severity. Mild or moderate dehydration can often be treated at home. Treatment should be started right away. Do not wait until dehydration becomes severe. Severe dehydration is an emergency and it needs to be treated in a hospital. Treatment for mild dehydration may include:   Drinking more fluids.  Replacing salts and minerals in your blood (electrolytes) that you may have lost. Treatment for moderate dehydration may include:   Drinking an oral rehydration solution (ORS). This is a drink that helps you replace fluids and electrolytes (rehydrate). It can be found at pharmacies and retail stores. Treatment for severe dehydration may include:   Receiving fluids through an IV tube.  Receiving an electrolyte solution through a feeding tube that is   passed through your nose and into your stomach (nasogastric tube, or NG tube).  Correcting any abnormalities in electrolytes.  Treating the underlying cause of dehydration. Follow these instructions at home:  If directed by your health care provider, drink an ORS:  Make an ORS by following instructions on the  package.  Start by drinking small amounts, about  cup (120 mL) every 5-10 minutes.  Slowly increase how much you drink until you have taken the amount recommended by your health care provider.  Drink enough clear fluid to keep your urine clear or pale yellow. If you were told to drink an ORS, finish the ORS first, then start slowly drinking other clear fluids. Drink fluids such as:  Water. Do not drink only water. Doing that can lead to having too little salt (sodium) in the body (hyponatremia).  Ice chips.  Fruit juice that you have added water to (diluted fruit juice).  Low-calorie sports drinks.  Avoid:  Alcohol.  Drinks that contain a lot of sugar. These include high-calorie sports drinks, fruit juice that is not diluted, and soda.  Caffeine.  Foods that are greasy or contain a lot of fat or sugar.  Take over-the-counter and prescription medicines only as told by your health care provider.  Do not take sodium tablets. This can lead to having too much sodium in the body (hypernatremia).  Eat foods that contain a healthy balance of electrolytes, such as bananas, oranges, potatoes, tomatoes, and spinach.  Keep all follow-up visits as told by your health care provider. This is important. Contact a health care provider if:  You have abdominal pain that:  Gets worse.  Stays in one area (localizes).  You have a rash.  You have a stiff neck.  You are more irritable than usual.  You are sleepier or more difficult to wake up than usual.  You feel weak or dizzy.  You feel very thirsty.  You have urinated only a small amount of very dark urine over 6-8 hours. Get help right away if:  You have symptoms of severe dehydration.  You cannot drink fluids without vomiting.  Your symptoms get worse with treatment.  You have a fever.  You have a severe headache.  You have vomiting or diarrhea that:  Gets worse.  Does not go away.  You have blood or green matter  (bile) in your vomit.  You have blood in your stool. This may cause stool to look black and tarry.  You have not urinated in 6-8 hours.  You faint.  Your heart rate while sitting still is over 100 beats a minute.  You have trouble breathing. This information is not intended to replace advice given to you by your health care provider. Make sure you discuss any questions you have with your health care provider. Document Released: 09/09/2005 Document Revised: 04/05/2016 Document Reviewed: 11/03/2015 Elsevier Interactive Patient Education  2017 [employer]  

## 2016-11-29 ENCOUNTER — Ambulatory Visit (HOSPITAL_BASED_OUTPATIENT_CLINIC_OR_DEPARTMENT_OTHER): Payer: Medicare Other | Admitting: Nurse Practitioner

## 2016-11-29 ENCOUNTER — Telehealth: Payer: Self-pay | Admitting: Oncology

## 2016-11-29 DIAGNOSIS — E860 Dehydration: Secondary | ICD-10-CM | POA: Diagnosis not present

## 2016-11-29 DIAGNOSIS — C250 Malignant neoplasm of head of pancreas: Secondary | ICD-10-CM | POA: Diagnosis not present

## 2016-11-29 MED ORDER — HEPARIN SOD (PORK) LOCK FLUSH 100 UNIT/ML IV SOLN
500.0000 [IU] | Freq: Once | INTRAVENOUS | Status: AC
  Administered 2016-11-29: 500 [IU] via INTRAVENOUS
  Filled 2016-11-29: qty 5

## 2016-11-29 MED ORDER — SODIUM CHLORIDE 0.9% FLUSH
10.0000 mL | Freq: Once | INTRAVENOUS | Status: AC
  Administered 2016-11-29: 10 mL via INTRAVENOUS
  Filled 2016-11-29: qty 10

## 2016-11-29 MED ORDER — SODIUM CHLORIDE 0.9 % IV SOLN
INTRAVENOUS | Status: AC
  Administered 2016-11-29: 11:00:00 via INTRAVENOUS

## 2016-11-29 NOTE — Patient Instructions (Signed)
Dehydration, Adult Dehydration is a condition in which there is not enough fluid or water in the body. This happens when you lose more fluids than you take in. Important organs, such as the kidneys, brain, and heart, cannot function without a proper amount of fluids. Any loss of fluids from the body can lead to dehydration. Dehydration can range from mild to severe. This condition should be treated right away to prevent it from becoming severe. What are the causes? This condition may be caused by:  Vomiting.  Diarrhea.  Excessive sweating, such as from heat exposure or exercise.  Not drinking enough fluid, especially:  When ill.  While doing activity that requires a lot of energy.  Excessive urination.  Fever.  Infection.  Certain medicines, such as medicines that cause the body to lose excess fluid (diuretics).  Inability to access safe drinking water.  Reduced physical ability to get adequate water and food. What increases the risk? This condition is more likely to develop in people:  Who have a poorly controlled long-term (chronic) illness, such as diabetes, heart disease, or kidney disease.  Who are age 65 or older.  Who are disabled.  Who live in a place with high altitude.  Who play endurance sports. What are the signs or symptoms? Symptoms of mild dehydration may include:   Thirst.  Dry lips.  Slightly dry mouth.  Dry, warm skin.  Dizziness. Symptoms of moderate dehydration may include:   Very dry mouth.  Muscle cramps.  Dark urine. Urine may be the color of tea.  Decreased urine production.  Decreased tear production.  Heartbeat that is irregular or faster than normal (palpitations).  Headache.  Light-headedness, especially when you stand up from a sitting position.  Fainting (syncope). Symptoms of severe dehydration may include:   Changes in skin, such as:  Cold and clammy skin.  Blotchy (mottled) or pale skin.  Skin that does  not quickly return to normal after being lightly pinched and released (poor skin turgor).  Changes in body fluids, such as:  Extreme thirst.  No tear production.  Inability to sweat when body temperature is high, such as in hot weather.  Very little urine production.  Changes in vital signs, such as:  Weak pulse.  Pulse that is more than 100 beats a minute when sitting still.  Rapid breathing.  Low blood pressure.  Other changes, such as:  Sunken eyes.  Cold hands and feet.  Confusion.  Lack of energy (lethargy).  Difficulty waking up from sleep.  Short-term weight loss.  Unconsciousness. How is this diagnosed? This condition is diagnosed based on your symptoms and a physical exam. Blood and urine tests may be done to help confirm the diagnosis. How is this treated? Treatment for this condition depends on the severity. Mild or moderate dehydration can often be treated at home. Treatment should be started right away. Do not wait until dehydration becomes severe. Severe dehydration is an emergency and it needs to be treated in a hospital. Treatment for mild dehydration may include:   Drinking more fluids.  Replacing salts and minerals in your blood (electrolytes) that you may have lost. Treatment for moderate dehydration may include:   Drinking an oral rehydration solution (ORS). This is a drink that helps you replace fluids and electrolytes (rehydrate). It can be found at pharmacies and retail stores. Treatment for severe dehydration may include:   Receiving fluids through an IV tube.  Receiving an electrolyte solution through a feeding tube that is   passed through your nose and into your stomach (nasogastric tube, or NG tube).  Correcting any abnormalities in electrolytes.  Treating the underlying cause of dehydration. Follow these instructions at home:  If directed by your health care provider, drink an ORS:  Make an ORS by following instructions on the  package.  Start by drinking small amounts, about  cup (120 mL) every 5-10 minutes.  Slowly increase how much you drink until you have taken the amount recommended by your health care provider.  Drink enough clear fluid to keep your urine clear or pale yellow. If you were told to drink an ORS, finish the ORS first, then start slowly drinking other clear fluids. Drink fluids such as:  Water. Do not drink only water. Doing that can lead to having too little salt (sodium) in the body (hyponatremia).  Ice chips.  Fruit juice that you have added water to (diluted fruit juice).  Low-calorie sports drinks.  Avoid:  Alcohol.  Drinks that contain a lot of sugar. These include high-calorie sports drinks, fruit juice that is not diluted, and soda.  Caffeine.  Foods that are greasy or contain a lot of fat or sugar.  Take over-the-counter and prescription medicines only as told by your health care provider.  Do not take sodium tablets. This can lead to having too much sodium in the body (hypernatremia).  Eat foods that contain a healthy balance of electrolytes, such as bananas, oranges, potatoes, tomatoes, and spinach.  Keep all follow-up visits as told by your health care provider. This is important. Contact a health care provider if:  You have abdominal pain that:  Gets worse.  Stays in one area (localizes).  You have a rash.  You have a stiff neck.  You are more irritable than usual.  You are sleepier or more difficult to wake up than usual.  You feel weak or dizzy.  You feel very thirsty.  You have urinated only a small amount of very dark urine over 6-8 hours. Get help right away if:  You have symptoms of severe dehydration.  You cannot drink fluids without vomiting.  Your symptoms get worse with treatment.  You have a fever.  You have a severe headache.  You have vomiting or diarrhea that:  Gets worse.  Does not go away.  You have blood or green matter  (bile) in your vomit.  You have blood in your stool. This may cause stool to look black and tarry.  You have not urinated in 6-8 hours.  You faint.  Your heart rate while sitting still is over 100 beats a minute.  You have trouble breathing. This information is not intended to replace advice given to you by your health care provider. Make sure you discuss any questions you have with your health care provider. Document Released: 09/09/2005 Document Revised: 04/05/2016 Document Reviewed: 11/03/2015 Elsevier Interactive Patient Education  2017 [employer]  

## 2016-11-29 NOTE — Telephone Encounter (Signed)
No los per 11/29/16 visit.

## 2016-11-29 NOTE — Progress Notes (Signed)
RN visit for IV fluids. 

## 2016-11-29 NOTE — Telephone Encounter (Signed)
No los per 11/28/16 visit.

## 2016-12-01 ENCOUNTER — Encounter: Payer: Self-pay | Admitting: Oncology

## 2016-12-01 ENCOUNTER — Other Ambulatory Visit: Payer: Self-pay | Admitting: Oncology

## 2016-12-02 ENCOUNTER — Encounter: Payer: Self-pay | Admitting: Oncology

## 2016-12-02 ENCOUNTER — Telehealth: Payer: Self-pay | Admitting: Oncology

## 2016-12-02 NOTE — Telephone Encounter (Signed)
lvm re r/s appt 3/20 at 0815 per LOS

## 2016-12-03 ENCOUNTER — Ambulatory Visit: Payer: Medicare Other

## 2016-12-03 ENCOUNTER — Other Ambulatory Visit: Payer: Medicare Other

## 2016-12-03 ENCOUNTER — Ambulatory Visit: Payer: Medicare Other | Admitting: Oncology

## 2016-12-05 ENCOUNTER — Ambulatory Visit: Payer: Medicare Other

## 2016-12-10 ENCOUNTER — Telehealth: Payer: Self-pay | Admitting: Oncology

## 2016-12-10 ENCOUNTER — Ambulatory Visit (HOSPITAL_BASED_OUTPATIENT_CLINIC_OR_DEPARTMENT_OTHER): Payer: Medicare Other

## 2016-12-10 ENCOUNTER — Other Ambulatory Visit (HOSPITAL_BASED_OUTPATIENT_CLINIC_OR_DEPARTMENT_OTHER): Payer: Medicare Other

## 2016-12-10 ENCOUNTER — Ambulatory Visit (HOSPITAL_BASED_OUTPATIENT_CLINIC_OR_DEPARTMENT_OTHER): Payer: Medicare Other | Admitting: Oncology

## 2016-12-10 ENCOUNTER — Other Ambulatory Visit: Payer: Self-pay | Admitting: *Deleted

## 2016-12-10 VITALS — BP 128/76 | HR 54 | Temp 98.6°F | Resp 16 | Ht 63.0 in | Wt 169.2 lb

## 2016-12-10 DIAGNOSIS — C250 Malignant neoplasm of head of pancreas: Secondary | ICD-10-CM

## 2016-12-10 DIAGNOSIS — C541 Malignant neoplasm of endometrium: Secondary | ICD-10-CM

## 2016-12-10 DIAGNOSIS — Z8542 Personal history of malignant neoplasm of other parts of uterus: Secondary | ICD-10-CM

## 2016-12-10 DIAGNOSIS — Z452 Encounter for adjustment and management of vascular access device: Secondary | ICD-10-CM

## 2016-12-10 DIAGNOSIS — Z5111 Encounter for antineoplastic chemotherapy: Secondary | ICD-10-CM | POA: Diagnosis not present

## 2016-12-10 DIAGNOSIS — Z95828 Presence of other vascular implants and grafts: Secondary | ICD-10-CM

## 2016-12-10 LAB — CBC WITH DIFFERENTIAL/PLATELET
BASO%: 0.9 % (ref 0.0–2.0)
Basophils Absolute: 0 10*3/uL (ref 0.0–0.1)
EOS%: 1.9 % (ref 0.0–7.0)
Eosinophils Absolute: 0.1 10*3/uL (ref 0.0–0.5)
HCT: 31.8 % — ABNORMAL LOW (ref 34.8–46.6)
HEMOGLOBIN: 10.6 g/dL — AB (ref 11.6–15.9)
LYMPH#: 1.1 10*3/uL (ref 0.9–3.3)
LYMPH%: 30.9 % (ref 14.0–49.7)
MCH: 31.9 pg (ref 25.1–34.0)
MCHC: 33.3 g/dL (ref 31.5–36.0)
MCV: 95.6 fL (ref 79.5–101.0)
MONO#: 0.6 10*3/uL (ref 0.1–0.9)
MONO%: 15.9 % — AB (ref 0.0–14.0)
NEUT#: 1.7 10*3/uL (ref 1.5–6.5)
NEUT%: 50.4 % (ref 38.4–76.8)
Platelets: 201 10*3/uL (ref 145–400)
RBC: 3.32 10*6/uL — ABNORMAL LOW (ref 3.70–5.45)
RDW: 16.4 % — AB (ref 11.2–14.5)
WBC: 3.5 10*3/uL — AB (ref 3.9–10.3)

## 2016-12-10 LAB — COMPREHENSIVE METABOLIC PANEL
ALBUMIN: 3.6 g/dL (ref 3.5–5.0)
ALK PHOS: 148 U/L (ref 40–150)
ALT: 27 U/L (ref 0–55)
AST: 45 U/L — ABNORMAL HIGH (ref 5–34)
Anion Gap: 8 mEq/L (ref 3–11)
BUN: 15.5 mg/dL (ref 7.0–26.0)
CALCIUM: 8.9 mg/dL (ref 8.4–10.4)
CO2: 24 mEq/L (ref 22–29)
Chloride: 105 mEq/L (ref 98–109)
Creatinine: 0.7 mg/dL (ref 0.6–1.1)
EGFR: 86 mL/min/{1.73_m2} — AB (ref >90)
GLUCOSE: 127 mg/dL (ref 70–140)
Potassium: 3.8 mEq/L (ref 3.5–5.1)
SODIUM: 136 meq/L (ref 136–145)
Total Bilirubin: 0.61 mg/dL (ref 0.20–1.20)
Total Protein: 6.4 g/dL (ref 6.4–8.3)

## 2016-12-10 MED ORDER — LEUCOVORIN CALCIUM INJECTION 350 MG
300.0000 mg/m2 | Freq: Once | INTRAVENOUS | Status: AC
  Administered 2016-12-10: 564 mg via INTRAVENOUS
  Filled 2016-12-10: qty 28.2

## 2016-12-10 MED ORDER — DEXTROSE 5 % IV SOLN
Freq: Once | INTRAVENOUS | Status: AC
  Administered 2016-12-10: 10:00:00 via INTRAVENOUS

## 2016-12-10 MED ORDER — PALONOSETRON HCL INJECTION 0.25 MG/5ML
INTRAVENOUS | Status: AC
  Filled 2016-12-10: qty 5

## 2016-12-10 MED ORDER — DEXAMETHASONE 4 MG PO TABS: 4.0000 mg | ORAL_TABLET | Freq: Two times a day (BID) | ORAL | 3 refills | Status: DC

## 2016-12-10 MED ORDER — PALONOSETRON HCL INJECTION 0.25 MG/5ML
0.2500 mg | Freq: Once | INTRAVENOUS | Status: AC
  Administered 2016-12-10: 0.25 mg via INTRAVENOUS

## 2016-12-10 MED ORDER — SODIUM CHLORIDE 0.9 % IV SOLN
1100.0000 mg/m2 | INTRAVENOUS | Status: DC
  Administered 2016-12-10: 2050 mg via INTRAVENOUS
  Filled 2016-12-10: qty 41

## 2016-12-10 MED ORDER — SODIUM CHLORIDE 0.9 % IJ SOLN
10.0000 mL | INTRAMUSCULAR | Status: DC | PRN
  Administered 2016-12-10: 10 mL via INTRAVENOUS
  Filled 2016-12-10: qty 10

## 2016-12-10 MED ORDER — SODIUM CHLORIDE 0.9 % IV SOLN
Freq: Once | INTRAVENOUS | Status: AC
  Administered 2016-12-10: 10:00:00 via INTRAVENOUS
  Filled 2016-12-10: qty 5

## 2016-12-10 MED ORDER — OXALIPLATIN CHEMO INJECTION 100 MG/20ML
65.0000 mg/m2 | Freq: Once | INTRAVENOUS | Status: AC
  Administered 2016-12-10: 120 mg via INTRAVENOUS
  Filled 2016-12-10: qty 4

## 2016-12-10 NOTE — Progress Notes (Signed)
[place] OFFICE PROGRESS NOTE   Diagnosis: Pancreas cancer  INTERVAL HISTORY:   [name] returns as scheduled. The diarrhea has resolved. Peripheral numbness and cold sensitivity has resolved. She has been working in the yard. She takes approximately 3 hydrocodone tablets per day for relief of abdominal pain.  Objective:  Vital signs in last 24 hours:  Blood pressure 128/76, pulse (!) 54, temperature 98.6 F (37 C), temperature source Oral, resp. rate 16, height 5\' 3"  (1.6 m), weight 169 lb 3.2 oz (76.7 kg), SpO2 99 %.    HEENT: No thrush or ulcers Resp: End inspiratory rales at the left posterior base, no respiratory distress Cardio: Regular rate and rhythm GI: No hepatosplenomegaly, no mass, nontender Vascular: No leg edema Neuro: Moderate loss of vibratory sense at the fingertips bilaterally    Portacath/PICC-without erythema  Lab Results:  Lab Results  Component Value Date   WBC 3.5 (L) 12/10/2016   HGB 10.6 (L) 12/10/2016   HCT 31.8 (L) 12/10/2016   MCV 95.6 12/10/2016   PLT 201 12/10/2016   NEUTROABS 1.7 12/10/2016     Medications: I have reviewed the patient's current medications.  Assessment/Plan: Clinical stage IB (T2 N0) adenocarcinoma of the head of the pancreas, status post an EUS biopsy 07/28/2014  Elevated CA 19-9  CT chest 08/04/2014-negative for metastatic disease  Pancreaticoduodenectomy 08/30/2014, stage II (T3 N0) moderately differential adenocarcinoma, negative resection margins (1 mm retroperitoneal margin)  Initiation of adjuvant gemcitabine 10/26/2014.  Gemcitabine held 11/02/2014 due to neutropenia.  Gemcitabine 11/09/2014 dose reduced 800 mg/m.  Gemcitabine held 11/16/2014 due to neutropenia.  Gemcitabine resumed 11/23/2014 every 2 week schedule.  Cycle 6 gemcitabine 01/05/2015  Cycle 7 gemcitabine 01/18/2015  Cycle 8 gemcitabine 02/01/2015  Cycle 9 gemcitabine 02/15/2015  Cycle 10 gemcitabine  03/01/2015  Cycle 11 gemcitabine 03/15/2015  Cycle 12 gemcitabine 03/29/2015  Elevated CA 19-01 May 2016  CTs 06/17/2016-new soft tissue mass at the root of the mesentery with vascular involvement  PET 06/27/2016-hypermetabolic activity associated with soft tissue adjacent to surgical clips in the central mesentery  Status post SBRTto the mesenteric mass completed 07/26/2016  CT abdomen/pelvis 08/23/2016 -mesenteric mass stable to slightly decreased in size.  Cycle 1 FOLFOX 09/03/2016  Cycle 2 FOLFOX 09/30/2016  Cycle 3 FOLFOX 10/14/2016   Cycle 4 FOLFOX 11/05/2016 (5-FU bolus eliminated and 5-FU pump dose reduced)  Cycle 5 FOLFOX 11/19/2016  CT abdomen/pelvis 11/28/2016-decreased size of soft tissue at the small bowel mesentery, mild asymmetric soft tissue at the left vaginal cuff  Cycle 6 FOLFOX 12/10/2016 (5-FU infusion further reduced and oxaliplatin reduced)   2.  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 Admission with post ERCP pancreatitis 06/16/2014  4. History of abdominal pain secondary to #1  5. Pulmonary embolism diagnosed on a CT of the abdomen 09/16/2014  Negative lower extremity Dopplers 09/17/2014  6. Multiple orthopedic surgical procedures  7. Endometrial cancer,stage IA, grade 1 endometrioid adenocarcinoma, 18% myometrial invasion, no lymphovascular space involvement, negative washings  Status post robotic total hysterectomy and bilateral salpingo-oophorectomy 11/30/2010  Recurrent tumor left lateral vagina status post biopsy 11/24/2014 with pathology confirming adenocarcinoma with focal squamous differentiation consistent with endometrial adenocarcinoma  Staging CT scans 12/06/2014 with no evidence of local pancreatic cancer recurrence. Small fluid collection adjacent to the left  adrenal gland. Severe hepatic steatosis. No evidence of local extension of endometrial carcinoma.  Carcinoma not well-defined at the vaginal cuff. 5 mm right external iliac lymph node. 3.6 mm left external iliac lymph node  Brachytherapy initiated 12/22/2014, completed 01/19/2015  CT abdomen/pelvis 07/24/2015 revealed a 3 x 4 cm soft tissue focus at the vaginal apex  PET scan 08/11/2015 revealed no mass at the vaginal apex and no evidence of metastatic disease  CT 11/28/2016-mild asymmetric soft tissue at the left vaginal cuff  8. History of atrial fibrillation-maintained on xarelto  9. Family history of multiple cancers-negative CancerNext gene panel  10. Prolonged nausea following the pancreaticoduodenectomy. Improved 10/26/2014.  11. Port-A-Cath placement 10/21/2014.  12. History of Neutropenia secondary to chemotherapy   13. Diarrhea. Question pancreatic insufficiency. Pancreatic enzyme replacement initiated 01/05/2015. Recurrent diarrhea following a course of antibiotics March 2017.  14. History of positional vertigo-resolved  15. Pain-abdominaland back pain-likely secondary to the mesenteric mass  16. Neutropenia secondary to chemotherapy 09/18/2016.  17. Delayed nausea and diarrhea following FOLFOX. Emend added with cycle 2. Decadron prophylaxis added with cycle 3  18. Diarrhea 10/28/2016. Question related to chemotherapy. Negative C. difficile testing 10/31/2016.    Disposition:  She had significant diarrhea following cycle 5 FOLFOX. The restaging CT shows a decrease in the size of the pancreas mass and no evidence of disease progression. The plan is to proceed with cycle 6 FOLFOX today. We we will dose reduce the oxaliplatin and 5-FU infusion with this cycle.  She will return for an office visit and the next cycle of chemotherapy in 3 weeks.  25 minutes were spent with the patient today. The majority of the time was used for counseling and coordination  of care.  Betsy Coder, MD  12/10/2016  9:32 AM

## 2016-12-10 NOTE — Telephone Encounter (Signed)
Appointments scheduled per 3.20.18 LOS. Patient notified of appointments.

## 2016-12-10 NOTE — Patient Instructions (Signed)
[facility] Discharge Instructions for Patients Receiving Chemotherapy  Today you received the following chemotherapy agents 5 Fu/Leucovorin/Oxaliplatin To help prevent nausea and vomiting after your treatment, we encourage you to take your nausea medication as prescribed.  If you develop nausea and vomiting that is not controlled by your nausea medication, call the clinic.   BELOW ARE SYMPTOMS THAT SHOULD BE REPORTED IMMEDIATELY:  *FEVER GREATER THAN 100.5 F  *CHILLS WITH OR WITHOUT FEVER  NAUSEA AND VOMITING THAT IS NOT CONTROLLED WITH YOUR NAUSEA MEDICATION  *UNUSUAL SHORTNESS OF BREATH  *UNUSUAL BRUISING OR BLEEDING  TENDERNESS IN MOUTH AND THROAT WITH OR WITHOUT PRESENCE OF ULCERS  *URINARY PROBLEMS  *BOWEL PROBLEMS  UNUSUAL RASH Items with * indicate a potential emergency and should be followed up as soon as possible.  Feel free to call the clinic you have any questions or concerns. The clinic phone number is [phone].  Please show the CHEMO ALERT CARD at check-in to the Emergency Department and triage nurse.   

## 2016-12-10 NOTE — Patient Instructions (Signed)
Implanted Port Home Guide An implanted port is a type of central line that is placed under the skin. Central lines are used to provide IV access when treatment or nutrition needs to be given through a person's veins. Implanted ports are used for long-term IV access. An implanted port may be placed because:  You need IV medicine that would be irritating to the small veins in your hands or arms.  You need long-term IV medicines, such as antibiotics.  You need IV nutrition for a long period.  You need frequent blood draws for lab tests.  You need dialysis.  Implanted ports are usually placed in the chest area, but they can also be placed in the upper arm, the abdomen, or the leg. An implanted port has two main parts:  Reservoir. The reservoir is round and will appear as a small, raised area under your skin. The reservoir is the part where a needle is inserted to give medicines or draw blood.  Catheter. The catheter is a thin, flexible tube that extends from the reservoir. The catheter is placed into a large vein. Medicine that is inserted into the reservoir goes into the catheter and then into the vein.  How will I care for my incision site? Do not get the incision site wet. Bathe or shower as directed by your health care provider. How is my port accessed? Special steps must be taken to access the port:  Before the port is accessed, a numbing cream can be placed on the skin. This helps numb the skin over the port site.  Your health care provider uses a sterile technique to access the port. ? Your health care provider must put on a mask and sterile gloves. ? The skin over your port is cleaned carefully with an antiseptic and allowed to dry. ? The port is gently pinched between sterile gloves, and a needle is inserted into the port.  Only "non-coring" port needles should be used to access the port. Once the port is accessed, a blood return should be checked. This helps ensure that the port  is in the vein and is not clogged.  If your port needs to remain accessed for a constant infusion, a clear (transparent) bandage will be placed over the needle site. The bandage and needle will need to be changed every week, or as directed by your health care provider.  Keep the bandage covering the needle clean and dry. Do not get it wet. Follow your health care provider's instructions on how to take a shower or bath while the port is accessed.  If your port does not need to stay accessed, no bandage is needed over the port.  What is flushing? Flushing helps keep the port from getting clogged. Follow your health care provider's instructions on how and when to flush the port. Ports are usually flushed with saline solution or a medicine called heparin. The need for flushing will depend on how the port is used.  If the port is used for intermittent medicines or blood draws, the port will need to be flushed: ? After medicines have been given. ? After blood has been drawn. ? As part of routine maintenance.  If a constant infusion is running, the port may not need to be flushed.  How long will my port stay implanted? The port can stay in for as long as your health care provider thinks it is needed. When it is time for the port to come out, surgery will be   done to remove it. The procedure is similar to the one performed when the port was put in. When should I seek immediate medical care? When you have an implanted port, you should seek immediate medical care if:  You notice a bad smell coming from the incision site.  You have swelling, redness, or drainage at the incision site.  You have more swelling or pain at the port site or the surrounding area.  You have a fever that is not controlled with medicine.  This information is not intended to replace advice given to you by your health care provider. Make sure you discuss any questions you have with your health care provider. Document  Released: 09/09/2005 Document Revised: 02/15/2016 Document Reviewed: 05/17/2013 Elsevier Interactive Patient Education  2017 [employer]vier [employer].  

## 2016-12-10 NOTE — Telephone Encounter (Signed)
Copy of AVS report and appointment schedule was given to patient, per 12/10/16 los.

## 2016-12-11 LAB — CANCER ANTIGEN 19-9: CA 19-9: 61 U/mL — ABNORMAL HIGH (ref 0–35)

## 2016-12-12 ENCOUNTER — Ambulatory Visit: Payer: Medicare Other

## 2016-12-12 ENCOUNTER — Ambulatory Visit (HOSPITAL_BASED_OUTPATIENT_CLINIC_OR_DEPARTMENT_OTHER): Payer: Medicare Other

## 2016-12-12 VITALS — BP 128/69 | HR 48 | Temp 97.5°F | Resp 18

## 2016-12-12 DIAGNOSIS — Z452 Encounter for adjustment and management of vascular access device: Secondary | ICD-10-CM

## 2016-12-12 DIAGNOSIS — C250 Malignant neoplasm of head of pancreas: Secondary | ICD-10-CM | POA: Diagnosis not present

## 2016-12-12 DIAGNOSIS — Z5189 Encounter for other specified aftercare: Secondary | ICD-10-CM | POA: Diagnosis not present

## 2016-12-12 MED ORDER — PEGFILGRASTIM INJECTION 6 MG/0.6ML ~~LOC~~
6.0000 mg | PREFILLED_SYRINGE | Freq: Once | SUBCUTANEOUS | Status: AC
  Administered 2016-12-12: 6 mg via SUBCUTANEOUS
  Filled 2016-12-12: qty 0.6

## 2016-12-12 MED ORDER — HEPARIN SOD (PORK) LOCK FLUSH 100 UNIT/ML IV SOLN
500.0000 [IU] | Freq: Once | INTRAVENOUS | Status: AC | PRN
  Administered 2016-12-12: 500 [IU]
  Filled 2016-12-12: qty 5

## 2016-12-12 MED ORDER — SODIUM CHLORIDE 0.9% FLUSH
10.0000 mL | INTRAVENOUS | Status: DC | PRN
  Administered 2016-12-12: 10 mL
  Filled 2016-12-12: qty 10

## 2016-12-31 ENCOUNTER — Other Ambulatory Visit (HOSPITAL_BASED_OUTPATIENT_CLINIC_OR_DEPARTMENT_OTHER): Payer: Medicare Other

## 2016-12-31 ENCOUNTER — Ambulatory Visit: Payer: Medicare Other

## 2016-12-31 ENCOUNTER — Telehealth: Payer: Self-pay | Admitting: Oncology

## 2016-12-31 ENCOUNTER — Ambulatory Visit (HOSPITAL_BASED_OUTPATIENT_CLINIC_OR_DEPARTMENT_OTHER): Payer: Medicare Other

## 2016-12-31 ENCOUNTER — Ambulatory Visit (HOSPITAL_BASED_OUTPATIENT_CLINIC_OR_DEPARTMENT_OTHER): Payer: Medicare Other | Admitting: Oncology

## 2016-12-31 DIAGNOSIS — C541 Malignant neoplasm of endometrium: Secondary | ICD-10-CM | POA: Diagnosis not present

## 2016-12-31 DIAGNOSIS — R197 Diarrhea, unspecified: Secondary | ICD-10-CM

## 2016-12-31 DIAGNOSIS — C250 Malignant neoplasm of head of pancreas: Secondary | ICD-10-CM

## 2016-12-31 DIAGNOSIS — Z452 Encounter for adjustment and management of vascular access device: Secondary | ICD-10-CM | POA: Diagnosis not present

## 2016-12-31 DIAGNOSIS — I4891 Unspecified atrial fibrillation: Secondary | ICD-10-CM

## 2016-12-31 DIAGNOSIS — Z95828 Presence of other vascular implants and grafts: Secondary | ICD-10-CM

## 2016-12-31 LAB — COMPREHENSIVE METABOLIC PANEL
ALT: 28 U/L (ref 0–55)
ANION GAP: 9 meq/L (ref 3–11)
AST: 46 U/L — ABNORMAL HIGH (ref 5–34)
Albumin: 3.5 g/dL (ref 3.5–5.0)
Alkaline Phosphatase: 137 U/L (ref 40–150)
BUN: 12.2 mg/dL (ref 7.0–26.0)
CALCIUM: 8.7 mg/dL (ref 8.4–10.4)
CHLORIDE: 107 meq/L (ref 98–109)
CO2: 25 meq/L (ref 22–29)
Creatinine: 0.7 mg/dL (ref 0.6–1.1)
EGFR: 90 mL/min/{1.73_m2} — ABNORMAL LOW (ref >90)
Glucose: 123 mg/dl (ref 70–140)
POTASSIUM: 3.3 meq/L — AB (ref 3.5–5.1)
Sodium: 141 mEq/L (ref 136–145)
Total Bilirubin: 0.6 mg/dL (ref 0.20–1.20)
Total Protein: 6.1 g/dL — ABNORMAL LOW (ref 6.4–8.3)

## 2016-12-31 LAB — CBC WITH DIFFERENTIAL/PLATELET
BASO%: 0.7 % (ref 0.0–2.0)
BASOS ABS: 0 10*3/uL (ref 0.0–0.1)
EOS%: 3.6 % (ref 0.0–7.0)
Eosinophils Absolute: 0.1 10*3/uL (ref 0.0–0.5)
HEMATOCRIT: 31.1 % — AB (ref 34.8–46.6)
HGB: 10.5 g/dL — ABNORMAL LOW (ref 11.6–15.9)
LYMPH#: 1 10*3/uL (ref 0.9–3.3)
LYMPH%: 30.8 % (ref 14.0–49.7)
MCH: 31.6 pg (ref 25.1–34.0)
MCHC: 33.8 g/dL (ref 31.5–36.0)
MCV: 93.6 fL (ref 79.5–101.0)
MONO#: 0.4 10*3/uL (ref 0.1–0.9)
MONO%: 14 % (ref 0.0–14.0)
NEUT#: 1.6 10*3/uL (ref 1.5–6.5)
NEUT%: 50.9 % (ref 38.4–76.8)
Platelets: 158 10*3/uL (ref 145–400)
RBC: 3.33 10*6/uL — AB (ref 3.70–5.45)
RDW: 15 % — ABNORMAL HIGH (ref 11.2–14.5)
WBC: 3.2 10*3/uL — ABNORMAL LOW (ref 3.9–10.3)

## 2016-12-31 MED ORDER — HYDROCODONE-ACETAMINOPHEN 5-325 MG PO TABS: 1.0000 | ORAL_TABLET | ORAL | 0 refills | Status: DC | PRN

## 2016-12-31 MED ORDER — SODIUM CHLORIDE 0.9 % IJ SOLN
10.0000 mL | INTRAMUSCULAR | Status: DC | PRN
  Administered 2016-12-31: 10 mL via INTRAVENOUS
  Filled 2016-12-31: qty 10

## 2016-12-31 NOTE — Progress Notes (Signed)
1130: pt reports pain along left side of dressing boarder, port flushed without difficultly and blood return noted, pt reports pain with flushing. Dressing removed and port deasccessed. Slight swelling noted above port a cath. Pain with deassess reported by pt. Pt reports pain at site and around site, no redness noted. Tanya RN aware whom was to discuss options with Dr. [name].  1215:Per Tanya RN per Dr. [name] pt to report to [facility] at 3:15 pm on 01/01/17 for dye study to look at port a cath. Pt aware and verbalizes understanding, pt reports that pain is better but still present. Pt stable at discharge.

## 2016-12-31 NOTE — Patient Instructions (Signed)
Implanted Port Home Guide An implanted port is a type of central line that is placed under the skin. Central lines are used to provide IV access when treatment or nutrition needs to be given through a person's veins. Implanted ports are used for long-term IV access. An implanted port may be placed because:  You need IV medicine that would be irritating to the small veins in your hands or arms.  You need long-term IV medicines, such as antibiotics.  You need IV nutrition for a long period.  You need frequent blood draws for lab tests.  You need dialysis.  Implanted ports are usually placed in the chest area, but they can also be placed in the upper arm, the abdomen, or the leg. An implanted port has two main parts:  Reservoir. The reservoir is round and will appear as a small, raised area under your skin. The reservoir is the part where a needle is inserted to give medicines or draw blood.  Catheter. The catheter is a thin, flexible tube that extends from the reservoir. The catheter is placed into a large vein. Medicine that is inserted into the reservoir goes into the catheter and then into the vein.  How will I care for my incision site? Do not get the incision site wet. Bathe or shower as directed by your health care provider. How is my port accessed? Special steps must be taken to access the port:  Before the port is accessed, a numbing cream can be placed on the skin. This helps numb the skin over the port site.  Your health care provider uses a sterile technique to access the port. ? Your health care provider must put on a mask and sterile gloves. ? The skin over your port is cleaned carefully with an antiseptic and allowed to dry. ? The port is gently pinched between sterile gloves, and a needle is inserted into the port.  Only "non-coring" port needles should be used to access the port. Once the port is accessed, a blood return should be checked. This helps ensure that the port  is in the vein and is not clogged.  If your port needs to remain accessed for a constant infusion, a clear (transparent) bandage will be placed over the needle site. The bandage and needle will need to be changed every week, or as directed by your health care provider.  Keep the bandage covering the needle clean and dry. Do not get it wet. Follow your health care provider's instructions on how to take a shower or bath while the port is accessed.  If your port does not need to stay accessed, no bandage is needed over the port.  What is flushing? Flushing helps keep the port from getting clogged. Follow your health care provider's instructions on how and when to flush the port. Ports are usually flushed with saline solution or a medicine called heparin. The need for flushing will depend on how the port is used.  If the port is used for intermittent medicines or blood draws, the port will need to be flushed: ? After medicines have been given. ? After blood has been drawn. ? As part of routine maintenance.  If a constant infusion is running, the port may not need to be flushed.  How long will my port stay implanted? The port can stay in for as long as your health care provider thinks it is needed. When it is time for the port to come out, surgery will be   done to remove it. The procedure is similar to the one performed when the port was put in. When should I seek immediate medical care? When you have an implanted port, you should seek immediate medical care if:  You notice a bad smell coming from the incision site.  You have swelling, redness, or drainage at the incision site.  You have more swelling or pain at the port site or the surrounding area.  You have a fever that is not controlled with medicine.  This information is not intended to replace advice given to you by your health care provider. Make sure you discuss any questions you have with your health care provider. Document  Released: 09/09/2005 Document Revised: 02/15/2016 Document Reviewed: 05/17/2013 Elsevier Interactive Patient Education  2017 [employer]vier [employer].  

## 2016-12-31 NOTE — Telephone Encounter (Signed)
Gave patient AVS and calender per 4/10 los.  

## 2016-12-31 NOTE — Progress Notes (Signed)
Dr. [name] made aware of pt's pain with port flush. Order received for dye study. Called IR, appt given for 4/11 @ 3:30. Will reschedule chemo, per Dr. [name].

## 2016-12-31 NOTE — Progress Notes (Signed)
[facility] OFFICE PROGRESS NOTE   Diagnosis: Pancreas cancer  INTERVAL HISTORY:   [name] returns as scheduled. She completed another cycle of FOLFOX 12/10/2016. She reports less diarrhea following this cycle of chemotherapy. She had increased abdominal pain for a few days last week. This has improved. Good energy level. No neuropathy symptoms at present. She reports pain at the Port-A-Cath site after the needle access was placed today. No pain prior to arriving at the [facility].  Objective:  Vital signs in last 24 hours:  Blood pressure (!) 141/71, pulse (!) 59, temperature 98.2 F (36.8 C), temperature source Oral, resp. rate 18, height 5\' 3"  (1.6 m), weight 169 lb (76.7 kg), SpO2 98 %.    HEENT: No thrush or ulcers Resp: Lungs clear bilaterally Cardio: Regular rate and rhythm GI: No hepatosplenomegaly, no mass, mild tenderness in the mid and right upper abdomen Vascular: No leg edema Neuro: Moderate loss of vibratory sense at the fingertip bilaterally   Portacath/PICC-without erythema, no chest wall, neck, or arm edema  Lab Results:  Lab Results  Component Value Date   WBC 3.2 (L) 12/31/2016   HGB 10.5 (L) 12/31/2016   HCT 31.1 (L) 12/31/2016   MCV 93.6 12/31/2016   PLT 158 12/31/2016   NEUTROABS 1.6 12/31/2016    CA 19-9 on 3 [id number] Medications: I have reviewed the patient's current medications.  Assessment/Plan: 1. 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  Gemcitabine held 11/02/2014 due to neutropenia.  Gemcitabine 11/09/2014 dose reduced 800 mg/m.  Gemcitabine held 11/16/2014 due to  neutropenia.  Gemcitabine resumed 11/23/2014 every 2 week schedule.  Cycle 6 gemcitabine 01/05/2015  Cycle 7 gemcitabine 01/18/2015  Cycle 8 gemcitabine 02/01/2015  Cycle 9 gemcitabine 02/15/2015  Cycle 10 gemcitabine 03/01/2015  Cycle 11 gemcitabine 03/15/2015  Cycle 12 gemcitabine 03/29/2015  Elevated CA 19-01 May 2016  CTs 06/17/2016-new soft tissue mass at the root of the mesentery with vascular involvement  PET 06/27/2016-hypermetabolic activity associated with soft tissue adjacent to surgical clips in the central mesentery  Status post SBRTto the mesenteric mass completed 07/26/2016  CT abdomen/pelvis 08/23/2016 -mesenteric mass stable to slightly decreased in size.  Cycle 1 FOLFOX 09/03/2016  Cycle 2 FOLFOX 09/30/2016  Cycle 3 FOLFOX 10/14/2016   Cycle 4 FOLFOX 11/05/2016 (5-FU bolus eliminated and 5-FU pump dose reduced)  Cycle 5 FOLFOX 11/19/2016  CT abdomen/pelvis 11/28/2016-decreased size of soft tissue at the small bowel mesentery, mild asymmetric soft tissue at the left vaginal cuff  Cycle 6 FOLFOX 12/10/2016 (5-FU infusion further reduced and oxaliplatin reduced)  Cycle 7 FOLFOX 12/31/2016   2.  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 Admission with post ERCP pancreatitis 06/16/2014  4. History of abdominal pain secondary to #1  5. Pulmonary embolism diagnosed on a CT of the abdomen 09/16/2014  Negative lower extremity Dopplers 09/17/2014  6. Multiple orthopedic surgical procedures  7. Endometrial cancer,stage IA, grade 1 endometrioid adenocarcinoma, 18% myometrial invasion, no lymphovascular space involvement, negative washings  Status post robotic total hysterectomy and bilateral salpingo-oophorectomy 11/30/2010  Recurrent tumor left lateral vagina status post biopsy  11/24/2014  with pathology confirming adenocarcinoma with focal squamous differentiation consistent with endometrial adenocarcinoma  Staging CT scans 12/06/2014 with no evidence of local pancreatic cancer recurrence. Small fluid collection adjacent to the left adrenal gland. Severe hepatic steatosis. No evidence of local extension of endometrial carcinoma. Carcinoma not well-defined at the vaginal cuff. 5 mm right external iliac lymph node. 3.6 mm left external iliac lymph node  Brachytherapy initiated 12/22/2014, completed 01/19/2015  CT abdomen/pelvis 07/24/2015 revealed a 3 x 4 cm soft tissue focus at the vaginal apex  PET scan 08/11/2015 revealed no mass at the vaginal apex and no evidence of metastatic disease  CT 11/28/2016-mild asymmetric soft tissue at the left vaginal cuff  8. History of atrial fibrillation-maintained on xarelto  9. Family history of multiple cancers-negative CancerNext gene panel  10. Prolonged nausea following the pancreaticoduodenectomy. Improved 10/26/2014.  11. Port-A-Cath placement 10/21/2014.  12. History of Neutropenia secondary to chemotherapy   13. Diarrhea. Question pancreatic insufficiency. Pancreatic enzyme replacement initiated 01/05/2015. Recurrent diarrhea following a course of antibiotics March 2017.  14. History of positional vertigo-resolved  15. Pain-abdominaland back pain-likely secondary to the mesenteric mass  16. Neutropenia secondary to chemotherapy 09/18/2016.  17. Delayed nausea and diarrhea following FOLFOX. Emend added with cycle 2. Decadron prophylaxis added with cycle 3  18. Diarrhea 10/28/2016. Question related to chemotherapy. Negative C. difficile testing 10/31/2016.    Disposition:  Her overall status appears unchanged. She had less diarrhea following the most recent cycle of chemotherapy. The plan is to proceed with cycle 7 FOLFOX today. We will follow-up on the CA 19-9 from today.  Ms. Leske will  return for an office visit and chemotherapy in 3 weeks. She will contact us for increased pain or diarrhea.  The plan is to continue for up to 10 cycles of FOLFOX she does not develop significant neuropathy symptoms.  25 minutes were spent with the patient today. The majority of the time was used for counseling and coordination of care.  Betsy Coder, MD  12/31/2016  10:29 AM

## 2017-01-01 ENCOUNTER — Telehealth: Payer: Self-pay | Admitting: *Deleted

## 2017-01-01 ENCOUNTER — Encounter (HOSPITAL_COMMUNITY): Payer: Self-pay | Admitting: Interventional Radiology

## 2017-01-01 ENCOUNTER — Ambulatory Visit (HOSPITAL_COMMUNITY)
Admission: RE | Admit: 2017-01-01 | Discharge: 2017-01-01 | Disposition: A | Discharge status: Home / Self Care | Payer: Medicare Other | Source: Ambulatory Visit | Attending: Oncology | Admitting: Oncology

## 2017-01-01 DIAGNOSIS — C259 Malignant neoplasm of pancreas, unspecified: Secondary | ICD-10-CM | POA: Insufficient documentation

## 2017-01-01 DIAGNOSIS — Z452 Encounter for adjustment and management of vascular access device: Secondary | ICD-10-CM | POA: Insufficient documentation

## 2017-01-01 DIAGNOSIS — C250 Malignant neoplasm of head of pancreas: Secondary | ICD-10-CM

## 2017-01-01 HISTORY — PX: IR CV LINE INJECTION: IMG2294

## 2017-01-01 LAB — CANCER ANTIGEN 19-9: CAN 19-9: 53 U/mL — AB (ref 0–35)

## 2017-01-01 MED ORDER — POTASSIUM CHLORIDE ER 20 MEQ PO TBCR: 10.0000 meq | EXTENDED_RELEASE_TABLET | Freq: Every day | ORAL | 0 refills | Status: DC

## 2017-01-01 MED ORDER — IOPAMIDOL (ISOVUE-300) INJECTION 61%
INTRAVENOUS | Status: AC
  Administered 2017-01-01: 7 mL via INTRAVENOUS
  Filled 2017-01-01: qty 50

## 2017-01-01 MED ORDER — HEPARIN SOD (PORK) LOCK FLUSH 100 UNIT/ML IV SOLN
INTRAVENOUS | Status: AC
  Filled 2017-01-01: qty 5

## 2017-01-01 MED ORDER — IOPAMIDOL (ISOVUE-300) INJECTION 61%
50.0000 mL | Freq: Once | INTRAVENOUS | Status: AC | PRN
  Administered 2017-01-01: 7 mL via INTRAVENOUS

## 2017-01-01 NOTE — Telephone Encounter (Signed)
Telephone call to patient to advise lab report for potassium is low. Prescription sent to the pharmacy for k+ tablets. Pt understands how to take medication. Pt required clarification on dye study and chemo scheduling for today. Pt understands she may return home after completing dye study and we would contact her about scheduling.   Advised pt Ca19.9 lab results for the past 3 months per her request. Pt appreciated call and understands to call with any concerns or questions.

## 2017-01-01 NOTE — Telephone Encounter (Signed)
Message from pt asking if she'll be able to get her treatment tomorrow. Radiology report is not back yet. Will call in the morning with a decision. Husband notified.

## 2017-01-01 NOTE — Telephone Encounter (Signed)
-----   Message from Ladell Pier, MD sent at 12/31/2016  7:51 PM EDT ----- Please call patient, potassium is low, start kcl 41meq daily

## 2017-01-02 ENCOUNTER — Ambulatory Visit: Payer: Medicare Other

## 2017-01-02 ENCOUNTER — Other Ambulatory Visit: Payer: Self-pay | Admitting: Oncology

## 2017-01-02 ENCOUNTER — Ambulatory Visit (HOSPITAL_BASED_OUTPATIENT_CLINIC_OR_DEPARTMENT_OTHER): Payer: Medicare Other

## 2017-01-02 ENCOUNTER — Other Ambulatory Visit: Payer: Medicare Other

## 2017-01-02 VITALS — BP 116/56 | HR 64 | Temp 98.4°F | Resp 16

## 2017-01-02 DIAGNOSIS — C250 Malignant neoplasm of head of pancreas: Secondary | ICD-10-CM | POA: Diagnosis not present

## 2017-01-02 DIAGNOSIS — Z5111 Encounter for antineoplastic chemotherapy: Secondary | ICD-10-CM

## 2017-01-02 MED ORDER — SODIUM CHLORIDE 0.9 % IV SOLN
Freq: Once | INTRAVENOUS | Status: AC
  Administered 2017-01-02: 15:00:00 via INTRAVENOUS
  Filled 2017-01-02: qty 5

## 2017-01-02 MED ORDER — LEUCOVORIN CALCIUM INJECTION 350 MG
300.0000 mg/m2 | Freq: Once | INTRAVENOUS | Status: AC
  Administered 2017-01-02: 564 mg via INTRAVENOUS
  Filled 2017-01-02: qty 28.2

## 2017-01-02 MED ORDER — SODIUM CHLORIDE 0.9 % IV SOLN
1100.0000 mg/m2 | INTRAVENOUS | Status: DC
  Administered 2017-01-02: 2050 mg via INTRAVENOUS
  Filled 2017-01-02: qty 41

## 2017-01-02 MED ORDER — PALONOSETRON HCL INJECTION 0.25 MG/5ML
INTRAVENOUS | Status: AC
  Filled 2017-01-02: qty 5

## 2017-01-02 MED ORDER — DEXTROSE 5 % IV SOLN
Freq: Once | INTRAVENOUS | Status: AC
  Administered 2017-01-02: 15:00:00 via INTRAVENOUS

## 2017-01-02 MED ORDER — OXALIPLATIN CHEMO INJECTION 100 MG/20ML
65.0000 mg/m2 | Freq: Once | INTRAVENOUS | Status: AC
  Administered 2017-01-02: 120 mg via INTRAVENOUS
  Filled 2017-01-02: qty 20

## 2017-01-02 MED ORDER — PALONOSETRON HCL INJECTION 0.25 MG/5ML
0.2500 mg | Freq: Once | INTRAVENOUS | Status: AC
  Administered 2017-01-02: 0.25 mg via INTRAVENOUS

## 2017-01-02 NOTE — Patient Instructions (Signed)
[facility] Discharge Instructions for Patients Receiving Chemotherapy  Today you received the following chemotherapy agents Oxaliplatin, Leucovorin, and 5 FU  To help prevent nausea and vomiting after your treatment, we encourage you to take your nausea medication as directed If you develop nausea and vomiting that is not controlled by your nausea medication, call the clinic.   BELOW ARE SYMPTOMS THAT SHOULD BE REPORTED IMMEDIATELY:  *FEVER GREATER THAN 100.5 F  *CHILLS WITH OR WITHOUT FEVER  NAUSEA AND VOMITING THAT IS NOT CONTROLLED WITH YOUR NAUSEA MEDICATION  *UNUSUAL SHORTNESS OF BREATH  *UNUSUAL BRUISING OR BLEEDING  TENDERNESS IN MOUTH AND THROAT WITH OR WITHOUT PRESENCE OF ULCERS  *URINARY PROBLEMS  *BOWEL PROBLEMS  UNUSUAL RASH Items with * indicate a potential emergency and should be followed up as soon as possible.  Feel free to call the clinic you have any questions or concerns. The clinic phone number is [phone].  Please show the CHEMO ALERT CARD at check-in to the Emergency Department and triage nurse.   

## 2017-01-02 NOTE — Progress Notes (Signed)
Okay to stop and disconnect adrucil pump at 1300 01/04/17.

## 2017-01-04 ENCOUNTER — Ambulatory Visit (HOSPITAL_BASED_OUTPATIENT_CLINIC_OR_DEPARTMENT_OTHER): Payer: Medicare Other

## 2017-01-04 VITALS — BP 122/59 | HR 52 | Temp 97.6°F | Resp 18

## 2017-01-04 DIAGNOSIS — C250 Malignant neoplasm of head of pancreas: Secondary | ICD-10-CM

## 2017-01-04 DIAGNOSIS — Z5189 Encounter for other specified aftercare: Secondary | ICD-10-CM | POA: Diagnosis not present

## 2017-01-04 MED ORDER — PEGFILGRASTIM INJECTION 6 MG/0.6ML ~~LOC~~
6.0000 mg | PREFILLED_SYRINGE | Freq: Once | SUBCUTANEOUS | Status: AC
  Administered 2017-01-04: 6 mg via SUBCUTANEOUS

## 2017-01-04 MED ORDER — SODIUM CHLORIDE 0.9% FLUSH
10.0000 mL | INTRAVENOUS | Status: DC | PRN
  Administered 2017-01-04: 10 mL
  Filled 2017-01-04: qty 10

## 2017-01-04 MED ORDER — HEPARIN SOD (PORK) LOCK FLUSH 100 UNIT/ML IV SOLN
500.0000 [IU] | Freq: Once | INTRAVENOUS | Status: AC | PRN
  Administered 2017-01-04: 500 [IU]
  Filled 2017-01-04: qty 5

## 2017-01-04 NOTE — Progress Notes (Signed)
Per Dr. [name] pump OK to be D/c 'd at 1300. KKLPN

## 2017-01-04 NOTE — Patient Instructions (Signed)
Pegfilgrastim injection What is this medicine? PEGFILGRASTIM (PEG fil gra stim) is a long-acting granulocyte colony-stimulating factor that stimulates the growth of neutrophils, a type of white blood cell important in the body's fight against infection. It is used to reduce the incidence of fever and infection in patients with certain types of cancer who are receiving chemotherapy that affects the bone marrow, and to increase survival after being exposed to high doses of radiation. This medicine may be used for other purposes; ask your health care provider or pharmacist if you have questions. COMMON BRAND NAME(S): Neulasta What should I tell my health care provider before I take this medicine? They need to know if you have any of these conditions: -kidney disease -latex allergy -ongoing radiation therapy -sickle cell disease -skin reactions to acrylic adhesives (On-Body Injector only) -an unusual or allergic reaction to pegfilgrastim, filgrastim, other medicines, foods, dyes, or preservatives -pregnant or trying to get pregnant -breast-feeding How should I use this medicine? This medicine is for injection under the skin. If you get this medicine at home, you will be taught how to prepare and give the pre-filled syringe or how to use the On-body Injector. Refer to the patient Instructions for Use for detailed instructions. Use exactly as directed. Tell your healthcare provider immediately if you suspect that the On-body Injector may not have performed as intended or if you suspect the use of the On-body Injector resulted in a missed or partial dose. It is important that you put your used needles and syringes in a special sharps container. Do not put them in a trash can. If you do not have a sharps container, call your pharmacist or healthcare provider to get one. Talk to your pediatrician regarding the use of this medicine in children. While this drug may be prescribed for selected conditions,  precautions do apply. Overdosage: If you think you have taken too much of this medicine contact a poison control center or emergency room at once. NOTE: This medicine is only for you. Do not share this medicine with others. What if I miss a dose? It is important not to miss your dose. Call your doctor or health care professional if you miss your dose. If you miss a dose due to an On-body Injector failure or leakage, a new dose should be administered as soon as possible using a single prefilled syringe for manual use. What may interact with this medicine? Interactions have not been studied. Give your health care provider a list of all the medicines, herbs, non-prescription drugs, or dietary supplements you use. Also tell them if you smoke, drink alcohol, or use illegal drugs. Some items may interact with your medicine. This list may not describe all possible interactions. Give your health care provider a list of all the medicines, herbs, non-prescription drugs, or dietary supplements you use. Also tell them if you smoke, drink alcohol, or use illegal drugs. Some items may interact with your medicine. What should I watch for while using this medicine? You may need blood work done while you are taking this medicine. If you are going to need a MRI, CT scan, or other procedure, tell your doctor that you are using this medicine (On-Body Injector only). What side effects may I notice from receiving this medicine? Side effects that you should report to your doctor or health care professional as soon as possible: -allergic reactions like skin rash, itching or hives, swelling of the face, lips, or tongue -dizziness -fever -pain, redness, or irritation at site   where injected -pinpoint red spots on the skin -red or dark-brown urine -shortness of breath or breathing problems -stomach or side pain, or pain at the shoulder -swelling -tiredness -trouble passing urine or change in the amount of urine Side  effects that usually do not require medical attention (report to your doctor or health care professional if they continue or are bothersome): -bone pain -muscle pain This list may not describe all possible side effects. Call your doctor for medical advice about side effects. You may report side effects to FDA at 1-800-FDA-1088. Where should I keep my medicine? Keep out of the reach of children. Store pre-filled syringes in a refrigerator between 2 and 8 degrees C (36 and 46 degrees F). Do not freeze. Keep in carton to protect from light. Throw away this medicine if it is left out of the refrigerator for more than 48 hours. Throw away any unused medicine after the expiration date. NOTE: This sheet is a summary. It may not cover all possible information. If you have questions about this medicine, talk to your doctor, pharmacist, or health care provider.  2018 Elsevier/Gold Standard (2016-09-05 12:58:03)  

## 2017-01-14 ENCOUNTER — Other Ambulatory Visit: Payer: Self-pay | Admitting: Cardiology
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
## 2017-01-14 NOTE — Telephone Encounter (Signed)
Request for Xarelto 20mg ; pt last seen by Bonney Leitz on 02/20/16, Crea-0.70 on 12/31/16, Wt-76.7kg on 12/31/16,Age-71 years old, CrCl-91.72ml/min

## 2017-01-15 ENCOUNTER — Encounter: Payer: Self-pay | Admitting: Gynecologic Oncology

## 2017-01-15 ENCOUNTER — Ambulatory Visit: Payer: Medicare Other | Attending: Gynecologic Oncology | Admitting: Gynecologic Oncology

## 2017-01-15 VITALS — BP 114/64 | HR 55 | Temp 98.2°F | Resp 20 | Wt 160.3 lb

## 2017-01-15 DIAGNOSIS — K8010 Calculus of gallbladder with chronic cholecystitis without obstruction: Secondary | ICD-10-CM | POA: Diagnosis not present

## 2017-01-15 DIAGNOSIS — M5030 Other cervical disc degeneration, unspecified cervical region: Secondary | ICD-10-CM | POA: Insufficient documentation

## 2017-01-15 DIAGNOSIS — I10 Essential (primary) hypertension: Secondary | ICD-10-CM | POA: Insufficient documentation

## 2017-01-15 DIAGNOSIS — Z803 Family history of malignant neoplasm of breast: Secondary | ICD-10-CM | POA: Insufficient documentation

## 2017-01-15 DIAGNOSIS — K219 Gastro-esophageal reflux disease without esophagitis: Secondary | ICD-10-CM | POA: Insufficient documentation

## 2017-01-15 DIAGNOSIS — Z79899 Other long term (current) drug therapy: Secondary | ICD-10-CM | POA: Diagnosis not present

## 2017-01-15 DIAGNOSIS — Z8507 Personal history of malignant neoplasm of pancreas: Secondary | ICD-10-CM

## 2017-01-15 DIAGNOSIS — E860 Dehydration: Secondary | ICD-10-CM | POA: Diagnosis not present

## 2017-01-15 DIAGNOSIS — Z7901 Long term (current) use of anticoagulants: Secondary | ICD-10-CM | POA: Diagnosis not present

## 2017-01-15 DIAGNOSIS — K760 Fatty (change of) liver, not elsewhere classified: Secondary | ICD-10-CM | POA: Insufficient documentation

## 2017-01-15 DIAGNOSIS — Z96653 Presence of artificial knee joint, bilateral: Secondary | ICD-10-CM | POA: Insufficient documentation

## 2017-01-15 DIAGNOSIS — R911 Solitary pulmonary nodule: Secondary | ICD-10-CM | POA: Insufficient documentation

## 2017-01-15 DIAGNOSIS — N950 Postmenopausal bleeding: Secondary | ICD-10-CM | POA: Diagnosis not present

## 2017-01-15 DIAGNOSIS — K648 Other hemorrhoids: Secondary | ICD-10-CM | POA: Insufficient documentation

## 2017-01-15 DIAGNOSIS — Z9221 Personal history of antineoplastic chemotherapy: Secondary | ICD-10-CM | POA: Diagnosis not present

## 2017-01-15 DIAGNOSIS — E669 Obesity, unspecified: Secondary | ICD-10-CM | POA: Diagnosis not present

## 2017-01-15 DIAGNOSIS — C541 Malignant neoplasm of endometrium: Secondary | ICD-10-CM | POA: Diagnosis present

## 2017-01-15 DIAGNOSIS — Z888 Allergy status to other drugs, medicaments and biological substances status: Secondary | ICD-10-CM | POA: Diagnosis not present

## 2017-01-15 DIAGNOSIS — Z833 Family history of diabetes mellitus: Secondary | ICD-10-CM | POA: Insufficient documentation

## 2017-01-15 DIAGNOSIS — Z9889 Other specified postprocedural states: Secondary | ICD-10-CM | POA: Insufficient documentation

## 2017-01-15 DIAGNOSIS — Z86711 Personal history of pulmonary embolism: Secondary | ICD-10-CM | POA: Insufficient documentation

## 2017-01-15 DIAGNOSIS — R197 Diarrhea, unspecified: Secondary | ICD-10-CM | POA: Diagnosis not present

## 2017-01-15 DIAGNOSIS — Z8041 Family history of malignant neoplasm of ovary: Secondary | ICD-10-CM | POA: Insufficient documentation

## 2017-01-15 DIAGNOSIS — I480 Paroxysmal atrial fibrillation: Secondary | ICD-10-CM | POA: Diagnosis not present

## 2017-01-15 DIAGNOSIS — Z96619 Presence of unspecified artificial shoulder joint: Secondary | ICD-10-CM | POA: Insufficient documentation

## 2017-01-15 DIAGNOSIS — Z90710 Acquired absence of both cervix and uterus: Secondary | ICD-10-CM | POA: Insufficient documentation

## 2017-01-15 DIAGNOSIS — Z8542 Personal history of malignant neoplasm of other parts of uterus: Secondary | ICD-10-CM

## 2017-01-15 DIAGNOSIS — Z8249 Family history of ischemic heart disease and other diseases of the circulatory system: Secondary | ICD-10-CM | POA: Insufficient documentation

## 2017-01-15 DIAGNOSIS — Z9049 Acquired absence of other specified parts of digestive tract: Secondary | ICD-10-CM | POA: Diagnosis not present

## 2017-01-15 DIAGNOSIS — Z90411 Acquired partial absence of pancreas: Secondary | ICD-10-CM | POA: Insufficient documentation

## 2017-01-15 DIAGNOSIS — C259 Malignant neoplasm of pancreas, unspecified: Secondary | ICD-10-CM | POA: Insufficient documentation

## 2017-01-15 DIAGNOSIS — Z800 Family history of malignant neoplasm of digestive organs: Secondary | ICD-10-CM | POA: Insufficient documentation

## 2017-01-15 NOTE — Patient Instructions (Addendum)
Follow up with Dr. Alycia Rossetti in 6 months.

## 2017-01-19 ENCOUNTER — Other Ambulatory Visit: Payer: Self-pay | Admitting: Oncology

## 2017-01-20 ENCOUNTER — Encounter: Payer: Self-pay | Admitting: Oncology

## 2017-01-21 ENCOUNTER — Other Ambulatory Visit: Payer: Self-pay | Admitting: *Deleted

## 2017-01-21 ENCOUNTER — Other Ambulatory Visit (HOSPITAL_BASED_OUTPATIENT_CLINIC_OR_DEPARTMENT_OTHER): Payer: Medicare Other

## 2017-01-21 ENCOUNTER — Ambulatory Visit: Payer: Medicare Other

## 2017-01-21 ENCOUNTER — Telehealth: Payer: Self-pay | Admitting: Oncology

## 2017-01-21 ENCOUNTER — Ambulatory Visit (HOSPITAL_BASED_OUTPATIENT_CLINIC_OR_DEPARTMENT_OTHER): Payer: Medicare Other

## 2017-01-21 ENCOUNTER — Ambulatory Visit (HOSPITAL_BASED_OUTPATIENT_CLINIC_OR_DEPARTMENT_OTHER): Payer: Medicare Other | Admitting: Oncology

## 2017-01-21 VITALS — BP 108/68 | HR 60 | Temp 98.7°F | Resp 18 | Ht 63.0 in | Wt 158.6 lb

## 2017-01-21 DIAGNOSIS — C250 Malignant neoplasm of head of pancreas: Secondary | ICD-10-CM

## 2017-01-21 DIAGNOSIS — Z8542 Personal history of malignant neoplasm of other parts of uterus: Secondary | ICD-10-CM | POA: Diagnosis not present

## 2017-01-21 DIAGNOSIS — Z95828 Presence of other vascular implants and grafts: Secondary | ICD-10-CM

## 2017-01-21 DIAGNOSIS — R630 Anorexia: Secondary | ICD-10-CM

## 2017-01-21 DIAGNOSIS — Z5111 Encounter for antineoplastic chemotherapy: Secondary | ICD-10-CM

## 2017-01-21 DIAGNOSIS — G620 Drug-induced polyneuropathy: Secondary | ICD-10-CM | POA: Diagnosis not present

## 2017-01-21 DIAGNOSIS — D701 Agranulocytosis secondary to cancer chemotherapy: Secondary | ICD-10-CM | POA: Diagnosis not present

## 2017-01-21 DIAGNOSIS — C541 Malignant neoplasm of endometrium: Secondary | ICD-10-CM

## 2017-01-21 LAB — CBC WITH DIFFERENTIAL/PLATELET
BASO%: 0.7 % (ref 0.0–2.0)
Basophils Absolute: 0 10*3/uL (ref 0.0–0.1)
EOS ABS: 0 10*3/uL (ref 0.0–0.5)
EOS%: 1.4 % (ref 0.0–7.0)
HCT: 34.4 % — ABNORMAL LOW (ref 34.8–46.6)
HGB: 11.4 g/dL — ABNORMAL LOW (ref 11.6–15.9)
LYMPH%: 31.3 % (ref 14.0–49.7)
MCH: 30.6 pg (ref 25.1–34.0)
MCHC: 33.1 g/dL (ref 31.5–36.0)
MCV: 92.5 fL (ref 79.5–101.0)
MONO#: 0.6 10*3/uL (ref 0.1–0.9)
MONO%: 20.1 % — AB (ref 0.0–14.0)
NEUT%: 46.5 % (ref 38.4–76.8)
NEUTROS ABS: 1.3 10*3/uL — AB (ref 1.5–6.5)
Platelets: 174 10*3/uL (ref 145–400)
RBC: 3.72 10*6/uL (ref 3.70–5.45)
RDW: 13.8 % (ref 11.2–14.5)
WBC: 2.8 10*3/uL — AB (ref 3.9–10.3)
lymph#: 0.9 10*3/uL (ref 0.9–3.3)

## 2017-01-21 LAB — COMPREHENSIVE METABOLIC PANEL
ALT: 43 U/L (ref 0–55)
ANION GAP: 8 meq/L (ref 3–11)
AST: 69 U/L — ABNORMAL HIGH (ref 5–34)
Albumin: 3.8 g/dL (ref 3.5–5.0)
Alkaline Phosphatase: 148 U/L (ref 40–150)
BUN: 13.7 mg/dL (ref 7.0–26.0)
CHLORIDE: 103 meq/L (ref 98–109)
CO2: 24 meq/L (ref 22–29)
Calcium: 8.9 mg/dL (ref 8.4–10.4)
Creatinine: 0.7 mg/dL (ref 0.6–1.1)
EGFR: 88 mL/min/{1.73_m2} — AB (ref >90)
GLUCOSE: 109 mg/dL (ref 70–140)
Potassium: 4.4 mEq/L (ref 3.5–5.1)
SODIUM: 136 meq/L (ref 136–145)
TOTAL PROTEIN: 6.9 g/dL (ref 6.4–8.3)
Total Bilirubin: 0.58 mg/dL (ref 0.20–1.20)

## 2017-01-21 MED ORDER — HYDROCODONE-ACETAMINOPHEN 5-325 MG PO TABS
ORAL_TABLET | ORAL | Status: AC
  Filled 2017-01-21: qty 1

## 2017-01-21 MED ORDER — PALONOSETRON HCL INJECTION 0.25 MG/5ML
0.2500 mg | Freq: Once | INTRAVENOUS | Status: AC
  Administered 2017-01-21: 0.25 mg via INTRAVENOUS

## 2017-01-21 MED ORDER — DEXAMETHASONE 4 MG PO TABS: 4.0000 mg | ORAL_TABLET | Freq: Two times a day (BID) | ORAL | 3 refills | Status: DC

## 2017-01-21 MED ORDER — PALONOSETRON HCL INJECTION 0.25 MG/5ML
INTRAVENOUS | Status: AC
  Filled 2017-01-21: qty 5

## 2017-01-21 MED ORDER — LEUCOVORIN CALCIUM INJECTION 350 MG
300.0000 mg/m2 | Freq: Once | INTRAVENOUS | Status: AC
  Administered 2017-01-21: 538 mg via INTRAVENOUS
  Filled 2017-01-21: qty 26.9

## 2017-01-21 MED ORDER — SODIUM CHLORIDE 0.9 % IV SOLN
1100.0000 mg/m2 | INTRAVENOUS | Status: DC
  Administered 2017-01-21: 1950 mg via INTRAVENOUS
  Filled 2017-01-21: qty 39

## 2017-01-21 MED ORDER — DIPHENOXYLATE-ATROPINE 2.5-0.025 MG PO TABS: 1.0000 | ORAL_TABLET | Freq: Four times a day (QID) | ORAL | 0 refills | Status: DC | PRN

## 2017-01-21 MED ORDER — SODIUM CHLORIDE 0.9% FLUSH
10.0000 mL | INTRAVENOUS | Status: DC | PRN
  Filled 2017-01-21: qty 10

## 2017-01-21 MED ORDER — HYDROCODONE-ACETAMINOPHEN 5-325 MG PO TABS
1.0000 | ORAL_TABLET | Freq: Once | ORAL | Status: AC
  Administered 2017-01-21: 1 via ORAL

## 2017-01-21 MED ORDER — HEPARIN SOD (PORK) LOCK FLUSH 100 UNIT/ML IV SOLN
500.0000 [IU] | Freq: Once | INTRAVENOUS | Status: DC
  Filled 2017-01-21: qty 5

## 2017-01-21 MED ORDER — DEXTROSE 5 % IV SOLN
65.0000 mg/m2 | Freq: Once | INTRAVENOUS | Status: AC
  Administered 2017-01-21: 115 mg via INTRAVENOUS
  Filled 2017-01-21: qty 3

## 2017-01-21 MED ORDER — SODIUM CHLORIDE 0.9 % IV SOLN
Freq: Once | INTRAVENOUS | Status: AC
  Administered 2017-01-21: 12:00:00 via INTRAVENOUS
  Filled 2017-01-21: qty 5

## 2017-01-21 MED ORDER — DEXTROSE 5 % IV SOLN
Freq: Once | INTRAVENOUS | Status: AC
  Administered 2017-01-21: 12:00:00 via INTRAVENOUS

## 2017-01-21 NOTE — Progress Notes (Signed)
OK to treat with ANC-1.3 per Dr. [name].

## 2017-01-21 NOTE — Progress Notes (Signed)
[facility] OFFICE PROGRESS NOTE   Diagnosis: Pancreas cancer  INTERVAL HISTORY:   [name] returns as scheduled. She completed another cycle of FOLFOX on 01/02/2017. She reports less diarrhea following this cycle of chemotherapy. The diarrhea was improved with Lomotil. She continues to have abdomen and back pain. She takes hydrocodone infrequently. She complains of sinus drainage and discomfort for the past few days. No fever. Mild dyspnea. She was out mowing her lawn over the weekend and thinks some of her symptoms may be related to pollen exposure. She has a poor appetite. She has mild peripheral numbness, especially in the left hand and foot. This does not interfere with activity.  [name] saw Dr. Alycia Rossetti on 01/15/2017. There is no evidence of her current endometrial cancer. Objective:  Vital signs in last 24 hours:  Blood pressure 108/68, pulse 60, temperature 98.7 F (37.1 C), temperature source Oral, resp. rate 18, height 5\' 3"  (1.6 m), weight 158 lb 9.6 oz (71.9 kg), SpO2 98 %.    HEENT: Mild bilateral maxillary tenderness, oropharynx without thrush or ulcers. Pharynx without erythema. Resp: Lungs clear bilaterally, no respiratory distress Cardio: Regular rate and rhythm GI: No hepatosplenomegaly, no mass, tender in the mid and right upper abdomen Vascular: No leg edema Neuro: Mild to moderate loss of vibratory sense at the fingertips bilaterally   Portacath/PICC-without erythema  Lab Results:  Lab Results  Component Value Date   WBC 2.8 (L) 01/21/2017   HGB 11.4 (L) 01/21/2017   HCT 34.4 (L) 01/21/2017   MCV 92.5 01/21/2017   PLT 174 01/21/2017   NEUTROABS 1.3 (L) 01/21/2017    CMP     Component Value Date/Time   NA 136 01/21/2017 0911   K 4.4 01/21/2017 0911   CL 104 01/05/2015 0822   CO2 24 01/21/2017 0911   GLUCOSE 109 01/21/2017 0911   BUN 13.7 01/21/2017 0911   CREATININE 0.7 01/21/2017 0911   CALCIUM 8.9 01/21/2017 0911   PROT  6.9 01/21/2017 0911   ALBUMIN 3.8 01/21/2017 0911   AST 69 (H) 01/21/2017 0911   ALT 43 01/21/2017 0911   ALKPHOS 148 01/21/2017 0911   BILITOT 0.58 01/21/2017 0911   GFRNONAA 88 (L) 01/05/2015 0822   GFRAA >90 01/05/2015 0822   CA 19-9 on December 31 2016-53  Medications: I have reviewed the patient's current medications.  Assessment/Plan: 1. 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  Gemcitabine held 11/02/2014 due to neutropenia.  Gemcitabine 11/09/2014 dose reduced 800 mg/m.  Gemcitabine held 11/16/2014 due to neutropenia.  Gemcitabine resumed 11/23/2014 every 2 week schedule.  Cycle 6 gemcitabine 01/05/2015  Cycle 7 gemcitabine 01/18/2015  Cycle 8 gemcitabine 02/01/2015  Cycle 9 gemcitabine 02/15/2015  Cycle 10 gemcitabine 03/01/2015  Cycle 11 gemcitabine 03/15/2015  Cycle 12 gemcitabine 03/29/2015  Elevated CA 19-01 May 2016  CTs 06/17/2016-new soft tissue mass at the root of the mesentery with vascular involvement  PET 06/27/2016-hypermetabolic activity associated with soft tissue adjacent to surgical clips in the central mesentery  Status post SBRTto the mesenteric mass completed 07/26/2016  CT abdomen/pelvis 08/23/2016 -mesenteric mass stable to slightly decreased in size.  Cycle 1 FOLFOX 09/03/2016  Cycle 2 FOLFOX 09/30/2016  Cycle 3 FOLFOX 10/14/2016  Cycle 4 FOLFOX 11/05/2016 (5-FU bolus eliminated and 5-FU pump dose reduced)  Cycle 5 FOLFOX 11/19/2016  CT abdomen/pelvis 11/28/2016-decreased size of soft tissue at the small bowel mesentery, mild asymmetric soft tissue at the left vaginal cuff  Cycle 6  FOLFOX 12/10/2016 (5-FU infusion further reduced and oxaliplatin reduced)  Cycle 7 FOLFOX 12/31/2016  Cycle 8 FOLFOX 12/31/2016   2. 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 Admission with post ERCP pancreatitis 06/16/2014  4. History of abdominal pain secondary to #1  5. Pulmonary embolism diagnosed on a CT of the abdomen 09/16/2014  Negative lower extremity Dopplers 09/17/2014  6. Multiple orthopedic surgical procedures  7. Endometrial cancer,stage IA, grade 1 endometrioid adenocarcinoma, 18% myometrial invasion, no lymphovascular space involvement, negative washings  Status post robotic total hysterectomy and bilateral salpingo-oophorectomy 11/30/2010  Recurrent tumor left lateral vagina status post biopsy 11/24/2014 with pathology confirming adenocarcinoma with focal squamous differentiation consistent with endometrial adenocarcinoma  Staging CT scans 12/06/2014 with no evidence of local pancreatic cancer recurrence. Small fluid collection adjacent to the left adrenal gland. Severe hepatic steatosis. No evidence of local extension of endometrial carcinoma. Carcinoma not well-defined at the vaginal cuff. 5 mm right external iliac lymph node. 3.6 mm left external iliac lymph node  Brachytherapy initiated 12/22/2014, completed 01/19/2015  CT abdomen/pelvis 07/24/2015 revealed a 3 x 4 cm soft tissue focus at the vaginal apex  PET scan 08/11/2015 revealed no mass at the vaginal apex and no evidence of metastatic disease  CT 11/28/2016-mild asymmetric soft tissue at the left vaginal cuff  8. History of atrial fibrillation-maintained on xarelto  9. Family history of multiple cancers-negative CancerNext gene panel  10. Prolonged nausea following the pancreaticoduodenectomy. Improved 10/26/2014.  11. Port-A-Cath  placement 10/21/2014.  12. History of Neutropenia secondary to chemotherapy   13. Diarrhea. Question pancreatic insufficiency. Pancreatic enzyme replacement initiated 01/05/2015. Recurrent diarrhea following a course of antibiotics March 2017.  14. History of positional vertigo-resolved  15. Pain-abdominaland back pain-likely secondary to the mesenteric mass  16. Neutropenia secondary to chemotherapy 09/18/2016.  17. Delayed nausea and diarrhea following FOLFOX. Emend added with cycle 2. Decadron prophylaxis added with cycle 3  18. Diarrhea 10/28/2016. Question related to chemotherapy. Negative C. difficile testing 10/31/2016.  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 She appears to have upper respiratory symptoms related to seasonal allergies or a viral upper respiratory infection today. We decided to proceed with cycle 8 FOLFOX. I adjusted the chemotherapy dose based on her weight loss.  She has anorexia. I encouraged her to increase her diet. We will refer her to the Cancer center nutritionist.  [name] has mild neutropenia today. She received Neulasta with chemotherapy. She knows to contact us for a fever.  She will return for an office visit and chemotherapy in 3 weeks. We will refer her for a restaging CT if she continues to lose weight. I encouraged her to use hydrocodone more frequently.  We discontinued will start in secondary to the weight loss and low blood pressure today.  25 minutes were spent with the patient today. The majority of the time was used for counseling and coordination of care.  Betsy Coder, MD  01/21/2017  10:50 AM

## 2017-01-21 NOTE — Telephone Encounter (Signed)
Appointments scheduled per 01/21/17 los. Patient was given a copy of the AVS report and appointment schedule per 01/21/17 los. °

## 2017-01-21 NOTE — Addendum Note (Signed)
Addended by: [place] on: 01/21/2017 12:11 PM   Modules accepted: Orders

## 2017-01-21 NOTE — Patient Instructions (Signed)
[facility] Discharge Instructions for Patients Receiving Chemotherapy  Today you received the following chemotherapy agents oxaliplatin, leucovorin, flororuacil   To help prevent nausea and vomiting after your treatment, we encourage you to take your nausea medication as directed  If you develop nausea and vomiting that is not controlled by your nausea medication, call the clinic.   BELOW ARE SYMPTOMS THAT SHOULD BE REPORTED IMMEDIATELY:  *FEVER GREATER THAN 100.5 F  *CHILLS WITH OR WITHOUT FEVER  NAUSEA AND VOMITING THAT IS NOT CONTROLLED WITH YOUR NAUSEA MEDICATION  *UNUSUAL SHORTNESS OF BREATH  *UNUSUAL BRUISING OR BLEEDING  TENDERNESS IN MOUTH AND THROAT WITH OR WITHOUT PRESENCE OF ULCERS  *URINARY PROBLEMS  *BOWEL PROBLEMS  UNUSUAL RASH Items with * indicate a potential emergency and should be followed up as soon as possible.  Feel free to call the clinic you have any questions or concerns. The clinic phone number is [phone].

## 2017-01-22 LAB — CANCER ANTIGEN 19-9: CA 19-9: 47 U/mL — ABNORMAL HIGH (ref 0–35)

## 2017-01-23 ENCOUNTER — Ambulatory Visit (HOSPITAL_BASED_OUTPATIENT_CLINIC_OR_DEPARTMENT_OTHER): Payer: Medicare Other

## 2017-01-23 ENCOUNTER — Ambulatory Visit: Payer: Medicare Other

## 2017-01-23 VITALS — BP 115/61 | HR 48 | Temp 98.2°F | Resp 18

## 2017-01-23 VITALS — BP 123/62 | HR 50 | Temp 98.1°F

## 2017-01-23 DIAGNOSIS — D701 Agranulocytosis secondary to cancer chemotherapy: Secondary | ICD-10-CM | POA: Diagnosis not present

## 2017-01-23 DIAGNOSIS — C250 Malignant neoplasm of head of pancreas: Secondary | ICD-10-CM

## 2017-01-23 DIAGNOSIS — R630 Anorexia: Secondary | ICD-10-CM | POA: Diagnosis not present

## 2017-01-23 DIAGNOSIS — Z452 Encounter for adjustment and management of vascular access device: Secondary | ICD-10-CM | POA: Diagnosis not present

## 2017-01-23 MED ORDER — SODIUM CHLORIDE 0.9 % IV SOLN: Freq: Once | INTRAVENOUS | Status: DC

## 2017-01-23 MED ORDER — SODIUM CHLORIDE 0.9 % IJ SOLN
10.0000 mL | INTRAMUSCULAR | Status: DC | PRN
  Administered 2017-01-23: 10 mL via INTRAVENOUS
  Filled 2017-01-23: qty 10

## 2017-01-23 MED ORDER — PEGFILGRASTIM INJECTION 6 MG/0.6ML ~~LOC~~
6.0000 mg | PREFILLED_SYRINGE | Freq: Once | SUBCUTANEOUS | Status: DC
  Administered 2017-01-23: 6 mg via SUBCUTANEOUS
  Filled 2017-01-23: qty 0.6

## 2017-01-23 MED ORDER — SODIUM CHLORIDE 0.9 % IV SOLN
Freq: Once | INTRAVENOUS | Status: AC
  Administered 2017-01-23: 14:00:00 via INTRAVENOUS

## 2017-01-23 MED ORDER — SODIUM CHLORIDE 0.9% FLUSH
10.0000 mL | INTRAVENOUS | Status: DC | PRN
  Filled 2017-01-23: qty 10

## 2017-01-23 MED ORDER — HEPARIN SOD (PORK) LOCK FLUSH 100 UNIT/ML IV SOLN
500.0000 [IU] | Freq: Once | INTRAVENOUS | Status: DC | PRN
  Filled 2017-01-23: qty 5

## 2017-01-23 MED ORDER — HEPARIN SOD (PORK) LOCK FLUSH 100 UNIT/ML IV SOLN
500.0000 [IU] | Freq: Once | INTRAVENOUS | Status: AC | PRN
  Administered 2017-01-23: 500 [IU] via INTRAVENOUS
  Filled 2017-01-23: qty 5

## 2017-01-23 NOTE — Progress Notes (Signed)
c/o worsening cough though no c/o SOB. States her secretions have turned yellow since she saw Dr. [name].   Made Dr. [name] aware and he will stop in to see her before she leaves for the day.  Dr. [name] in to see pt after fluids completed.

## 2017-01-23 NOTE — Progress Notes (Signed)
Patient with complaints of increase fatigue today.  Adrucil pump complete and removed.  Patient has no signs or symptoms of distress.  Vital signs stable.  Dr. [name] notified with orders for patient to receive 1 liter of NS in symptom management today.  Received Neulasta injection.

## 2017-01-23 NOTE — Patient Instructions (Signed)
Dehydration, Adult Dehydration is a condition in which there is not enough fluid or water in the body. This happens when you lose more fluids than you take in. Important organs, such as the kidneys, brain, and heart, cannot function without a proper amount of fluids. Any loss of fluids from the body can lead to dehydration. Dehydration can range from mild to severe. This condition should be treated right away to prevent it from becoming severe. What are the causes? This condition may be caused by:  Vomiting.  Diarrhea.  Excessive sweating, such as from heat exposure or exercise.  Not drinking enough fluid, especially:  When ill.  While doing activity that requires a lot of energy.  Excessive urination.  Fever.  Infection.  Certain medicines, such as medicines that cause the body to lose excess fluid (diuretics).  Inability to access safe drinking water.  Reduced physical ability to get adequate water and food. What increases the risk? This condition is more likely to develop in people:  Who have a poorly controlled long-term (chronic) illness, such as diabetes, heart disease, or kidney disease.  Who are age 65 or older.  Who are disabled.  Who live in a place with high altitude.  Who play endurance sports. What are the signs or symptoms? Symptoms of mild dehydration may include:   Thirst.  Dry lips.  Slightly dry mouth.  Dry, warm skin.  Dizziness. Symptoms of moderate dehydration may include:   Very dry mouth.  Muscle cramps.  Dark urine. Urine may be the color of tea.  Decreased urine production.  Decreased tear production.  Heartbeat that is irregular or faster than normal (palpitations).  Headache.  Light-headedness, especially when you stand up from a sitting position.  Fainting (syncope). Symptoms of severe dehydration may include:   Changes in skin, such as:  Cold and clammy skin.  Blotchy (mottled) or pale skin.  Skin that does  not quickly return to normal after being lightly pinched and released (poor skin turgor).  Changes in body fluids, such as:  Extreme thirst.  No tear production.  Inability to sweat when body temperature is high, such as in hot weather.  Very little urine production.  Changes in vital signs, such as:  Weak pulse.  Pulse that is more than 100 beats a minute when sitting still.  Rapid breathing.  Low blood pressure.  Other changes, such as:  Sunken eyes.  Cold hands and feet.  Confusion.  Lack of energy (lethargy).  Difficulty waking up from sleep.  Short-term weight loss.  Unconsciousness. How is this diagnosed? This condition is diagnosed based on your symptoms and a physical exam. Blood and urine tests may be done to help confirm the diagnosis. How is this treated? Treatment for this condition depends on the severity. Mild or moderate dehydration can often be treated at home. Treatment should be started right away. Do not wait until dehydration becomes severe. Severe dehydration is an emergency and it needs to be treated in a hospital. Treatment for mild dehydration may include:   Drinking more fluids.  Replacing salts and minerals in your blood (electrolytes) that you may have lost. Treatment for moderate dehydration may include:   Drinking an oral rehydration solution (ORS). This is a drink that helps you replace fluids and electrolytes (rehydrate). It can be found at pharmacies and retail stores. Treatment for severe dehydration may include:   Receiving fluids through an IV tube.  Receiving an electrolyte solution through a feeding tube that is   passed through your nose and into your stomach (nasogastric tube, or NG tube).  Correcting any abnormalities in electrolytes.  Treating the underlying cause of dehydration. Follow these instructions at home:  If directed by your health care provider, drink an ORS:  Make an ORS by following instructions on the  package.  Start by drinking small amounts, about  cup (120 mL) every 5-10 minutes.  Slowly increase how much you drink until you have taken the amount recommended by your health care provider.  Drink enough clear fluid to keep your urine clear or pale yellow. If you were told to drink an ORS, finish the ORS first, then start slowly drinking other clear fluids. Drink fluids such as:  Water. Do not drink only water. Doing that can lead to having too little salt (sodium) in the body (hyponatremia).  Ice chips.  Fruit juice that you have added water to (diluted fruit juice).  Low-calorie sports drinks.  Avoid:  Alcohol.  Drinks that contain a lot of sugar. These include high-calorie sports drinks, fruit juice that is not diluted, and soda.  Caffeine.  Foods that are greasy or contain a lot of fat or sugar.  Take over-the-counter and prescription medicines only as told by your health care provider.  Do not take sodium tablets. This can lead to having too much sodium in the body (hypernatremia).  Eat foods that contain a healthy balance of electrolytes, such as bananas, oranges, potatoes, tomatoes, and spinach.  Keep all follow-up visits as told by your health care provider. This is important. Contact a health care provider if:  You have abdominal pain that:  Gets worse.  Stays in one area (localizes).  You have a rash.  You have a stiff neck.  You are more irritable than usual.  You are sleepier or more difficult to wake up than usual.  You feel weak or dizzy.  You feel very thirsty.  You have urinated only a small amount of very dark urine over 6-8 hours. Get help right away if:  You have symptoms of severe dehydration.  You cannot drink fluids without vomiting.  Your symptoms get worse with treatment.  You have a fever.  You have a severe headache.  You have vomiting or diarrhea that:  Gets worse.  Does not go away.  You have blood or green matter  (bile) in your vomit.  You have blood in your stool. This may cause stool to look black and tarry.  You have not urinated in 6-8 hours.  You faint.  Your heart rate while sitting still is over 100 beats a minute.  You have trouble breathing. This information is not intended to replace advice given to you by your health care provider. Make sure you discuss any questions you have with your health care provider. Document Released: 09/09/2005 Document Revised: 04/05/2016 Document Reviewed: 11/03/2015 Elsevier Interactive Patient Education  2017 [employer]  

## 2017-01-23 NOTE — Patient Instructions (Signed)
Central Line, Adult A central line is a thin, flexible tube (catheter) that is put in your vein. It can be used to:  Take blood for lab tests.  Give you medicine.  Give you food and nutrients.  The procedure may vary among doctors and hospitals. Follow these instructions at home: Caring for the tube  Follow instructions from your doctor about: ? Flushing the tube with saline solution. ? Cleaning the tube and the area around it.  Only flush with clean (sterile) supplies. The supplies should be from your doctor, a pharmacy, or another place that your doctor recommends.  Before you flush the tube or clean the area around the tube: ? Wash your hands with soap and water. If you cannot use soap and water, use hand sanitizer. ? Clean the central line hub with rubbing alcohol. Caring for your skin  Keep the area where the tube was put in clean and dry.  Every day, and when changing the bandage, check the skin around the central line for: ? Redness, swelling, or pain. ? Fluid or blood. ? Warmth. ? Pus. ? A bad smell. General instructions  Keep the tube clamped, unless it is being used.  Keep your supplies in a clean, dry location.  If you or someone else accidentally pulls on the tube, make sure: ? The bandage (dressing) is okay. ? There is no bleeding. ? The tube has not been pulled out.  Do not use scissors or sharp objects near the tube.  Do not swim or let the tube soak in a tub.  Ask your doctor what activities are safe for you. Your doctor may tell you not to lift anything or move your arm too much.  Take over-the-counter and prescription medicines only as told by your doctor.  Change bandages as told by your doctor.  Keep your bandage dry. If a bandage gets wet, have it changed right away.  Keep all follow-up visits as told by your doctor. This is important. Throwing away supplies  Throw away any syringes in a trash (disposal) container that is only for sharp  items (sharps container). You can buy a sharps container from a pharmacy, or you can make one by using an empty hard plastic bottle with a cover.  Place any used bandages or infusion bags into a plastic bag. Throw that bag in the trash. Contact a doctor if:  You have any of these where the tube was put in: ? Redness, swelling, or pain. ? Fluid or blood. ? A warm feeling. ? Pus or a bad smell. Get help right away if:  You have: ? A fever. ? Chills. ? Trouble getting enough air (shortness of breath). ? Trouble breathing. ? Pain in your chest. ? Swelling in your neck, face, chest, or arm.  You are coughing.  You feel your heart beating fast or skipping beats.  You feel dizzy or you pass out (faint).  There are red lines coming from where the tube was put in.  The area where the tube was put in is bleeding and the bleeding will not stop.  Your tube is hard to flush.  You do not get a blood return from the tube.  The tube gets loose or comes out.  The tube has a hole or a tear.  The tube leaks. Summary  A central line is a thin, flexible tube (catheter) that is put in your vein. It can be used to take blood for lab tests or to   give you medicine.  Follow instructions from your doctor about flushing and cleaning the tube.  Keep the area where the tube was put in clean and dry.  Ask your doctor what activities are safe for you. This information is not intended to replace advice given to you by your health care provider. Make sure you discuss any questions you have with your health care provider. Document Released: 08/26/2012 Document Revised: 09/26/2016 Document Reviewed: 09/26/2016 Elsevier Interactive Patient Education  2017 [employer]   Pegfilgrastim injection What is this medicine? PEGFILGRASTIM (PEG fil gra stim) is a long-acting granulocyte colony-stimulating factor that stimulates the growth of neutrophils, a type of white blood cell important in the body's  fight against infection. It is used to reduce the incidence of fever and infection in patients with certain types of cancer who are receiving chemotherapy that affects the bone marrow, and to increase survival after being exposed to high doses of radiation. This medicine may be used for other purposes; ask your health care provider or pharmacist if you have questions. COMMON BRAND NAME(S): Neulasta What should I tell my health care provider before I take this medicine? They need to know if you have any of these conditions: -kidney disease -latex allergy -ongoing radiation therapy -sickle cell disease -skin reactions to acrylic adhesives (On-Body Injector only) -an unusual or allergic reaction to pegfilgrastim, filgrastim, other medicines, foods, dyes, or preservatives -pregnant or trying to get pregnant -breast-feeding How should I use this medicine? This medicine is for injection under the skin. If you get this medicine at home, you will be taught how to prepare and give the pre-filled syringe or how to use the On-body Injector. Refer to the patient Instructions for Use for detailed instructions. Use exactly as directed. Tell your healthcare provider immediately if you suspect that the On-body Injector may not have performed as intended or if you suspect the use of the On-body Injector resulted in a missed or partial dose. It is important that you put your used needles and syringes in a special sharps container. Do not put them in a trash can. If you do not have a sharps container, call your pharmacist or healthcare provider to get one. Talk to your pediatrician regarding the use of this medicine in children. While this drug may be prescribed for selected conditions, precautions do apply. Overdosage: If you think you have taken too much of this medicine contact a poison control center or emergency room at once. NOTE: This medicine is only for you. Do not share this medicine with others. What if I  miss a dose? It is important not to miss your dose. Call your doctor or health care professional if you miss your dose. If you miss a dose due to an On-body Injector failure or leakage, a new dose should be administered as soon as possible using a single prefilled syringe for manual use. What may interact with this medicine? Interactions have not been studied. Give your health care provider a list of all the medicines, herbs, non-prescription drugs, or dietary supplements you use. Also tell them if you smoke, drink alcohol, or use illegal drugs. Some items may interact with your medicine. This list may not describe all possible interactions. Give your health care provider a list of all the medicines, herbs, non-prescription drugs, or dietary supplements you use. Also tell them if you smoke, drink alcohol, or use illegal drugs. Some items may interact with your medicine. What should I watch for while using this medicine?   You may need blood work done while you are taking this medicine. If you are going to need a MRI, CT scan, or other procedure, tell your doctor that you are using this medicine (On-Body Injector only). What side effects may I notice from receiving this medicine? Side effects that you should report to your doctor or health care professional as soon as possible: -allergic reactions like skin rash, itching or hives, swelling of the face, lips, or tongue -dizziness -fever -pain, redness, or irritation at site where injected -pinpoint red spots on the skin -red or dark-brown urine -shortness of breath or breathing problems -stomach or side pain, or pain at the shoulder -swelling -tiredness -trouble passing urine or change in the amount of urine Side effects that usually do not require medical attention (report to your doctor or health care professional if they continue or are bothersome): -bone pain -muscle pain This list may not describe all possible side effects. Call your doctor  for medical advice about side effects. You may report side effects to FDA at 1-800-FDA-1088. Where should I keep my medicine? Keep out of the reach of children. Store pre-filled syringes in a refrigerator between 2 and 8 degrees C (36 and 46 degrees F). Do not freeze. Keep in carton to protect from light. Throw away this medicine if it is left out of the refrigerator for more than 48 hours. Throw away any unused medicine after the expiration date. NOTE: This sheet is a summary. It may not cover all possible information. If you have questions about this medicine, talk to your doctor, pharmacist, or health care provider.  2018 Elsevier/Gold Standard (2016-09-05 12:58:03)  

## 2017-01-25 ENCOUNTER — Emergency Department (HOSPITAL_COMMUNITY): Payer: Medicare Other

## 2017-01-25 ENCOUNTER — Encounter (HOSPITAL_COMMUNITY): Payer: Self-pay | Admitting: Emergency Medicine

## 2017-01-25 ENCOUNTER — Other Ambulatory Visit: Payer: Self-pay

## 2017-01-25 ENCOUNTER — Observation Stay (HOSPITAL_COMMUNITY)
Admission: EM | Admit: 2017-01-25 | Discharge: 2017-01-27 | Disposition: A | Discharge status: Home / Self Care | Payer: Medicare Other | Attending: Internal Medicine | Admitting: Internal Medicine

## 2017-01-25 DIAGNOSIS — R0602 Shortness of breath: Secondary | ICD-10-CM

## 2017-01-25 DIAGNOSIS — Z96653 Presence of artificial knee joint, bilateral: Secondary | ICD-10-CM | POA: Diagnosis not present

## 2017-01-25 DIAGNOSIS — Z79899 Other long term (current) drug therapy: Secondary | ICD-10-CM | POA: Diagnosis not present

## 2017-01-25 DIAGNOSIS — I480 Paroxysmal atrial fibrillation: Secondary | ICD-10-CM | POA: Diagnosis not present

## 2017-01-25 DIAGNOSIS — Z8542 Personal history of malignant neoplasm of other parts of uterus: Secondary | ICD-10-CM | POA: Insufficient documentation

## 2017-01-25 DIAGNOSIS — J209 Acute bronchitis, unspecified: Principal | ICD-10-CM

## 2017-01-25 DIAGNOSIS — Z96612 Presence of left artificial shoulder joint: Secondary | ICD-10-CM | POA: Diagnosis not present

## 2017-01-25 DIAGNOSIS — Z7901 Long term (current) use of anticoagulants: Secondary | ICD-10-CM | POA: Insufficient documentation

## 2017-01-25 DIAGNOSIS — C250 Malignant neoplasm of head of pancreas: Secondary | ICD-10-CM

## 2017-01-25 DIAGNOSIS — D72828 Other elevated white blood cell count: Secondary | ICD-10-CM

## 2017-01-25 DIAGNOSIS — D72829 Elevated white blood cell count, unspecified: Secondary | ICD-10-CM | POA: Diagnosis present

## 2017-01-25 DIAGNOSIS — I10 Essential (primary) hypertension: Secondary | ICD-10-CM | POA: Diagnosis not present

## 2017-01-25 DIAGNOSIS — C259 Malignant neoplasm of pancreas, unspecified: Secondary | ICD-10-CM | POA: Diagnosis present

## 2017-01-25 DIAGNOSIS — Z8507 Personal history of malignant neoplasm of pancreas: Secondary | ICD-10-CM | POA: Diagnosis not present

## 2017-01-25 LAB — COMPREHENSIVE METABOLIC PANEL
ALBUMIN: 3.9 g/dL (ref 3.5–5.0)
ALK PHOS: 181 U/L — AB (ref 38–126)
ALT: 86 U/L — AB (ref 14–54)
AST: 113 U/L — ABNORMAL HIGH (ref 15–41)
Anion gap: 15 (ref 5–15)
BUN: 10 mg/dL (ref 6–20)
CALCIUM: 8.8 mg/dL — AB (ref 8.9–10.3)
CHLORIDE: 101 mmol/L (ref 101–111)
CO2: 19 mmol/L — AB (ref 22–32)
CREATININE: 0.8 mg/dL (ref 0.44–1.00)
GFR calc Af Amer: >60 mL/min (ref >60)
GFR calc non Af Amer: >60 mL/min (ref >60)
GLUCOSE: 129 mg/dL — AB (ref 65–99)
Potassium: 3.3 mmol/L — ABNORMAL LOW (ref 3.5–5.1)
SODIUM: 135 mmol/L (ref 135–145)
Total Bilirubin: 0.7 mg/dL (ref 0.3–1.2)
Total Protein: 7.1 g/dL (ref 6.5–8.1)

## 2017-01-25 LAB — BLOOD GAS, VENOUS
Acid-Base Excess: 0 mmol/L (ref 0.0–2.0)
Bicarbonate: 21.8 mmol/L (ref 20.0–28.0)
O2 Content: 3 L/min
O2 SAT: 67.4 %
PATIENT TEMPERATURE: 98.6
PCO2 VEN: 27.4 mmHg — AB (ref 44.0–60.0)
PO2 VEN: 34.6 mmHg (ref 32.0–45.0)
pH, Ven: 7.512 — ABNORMAL HIGH (ref 7.250–7.430)

## 2017-01-25 LAB — CBC WITH DIFFERENTIAL/PLATELET
BASOS ABS: 0 10*3/uL (ref 0.0–0.1)
Basophils Relative: 0 %
Eosinophils Absolute: 0 10*3/uL (ref 0.0–0.7)
Eosinophils Relative: 0 %
HCT: 35 % — ABNORMAL LOW (ref 36.0–46.0)
HEMOGLOBIN: 11.5 g/dL — AB (ref 12.0–15.0)
LYMPHS PCT: 13 %
Lymphs Abs: 4.2 10*3/uL — ABNORMAL HIGH (ref 0.7–4.0)
MCH: 31.3 pg (ref 26.0–34.0)
MCHC: 32.9 g/dL (ref 30.0–36.0)
MCV: 95.4 fL (ref 78.0–100.0)
Monocytes Absolute: 0.6 10*3/uL (ref 0.1–1.0)
Monocytes Relative: 2 %
NEUTROS ABS: 27.6 10*3/uL — AB (ref 1.7–7.7)
Neutrophils Relative %: 85 %
Platelets: 173 10*3/uL (ref 150–400)
RBC: 3.67 MIL/uL — AB (ref 3.87–5.11)
RDW: 13.4 % (ref 11.5–15.5)
WBC: 32.4 10*3/uL — AB (ref 4.0–10.5)

## 2017-01-25 LAB — I-STAT TROPONIN, ED: Troponin i, poc: 0.02 ng/mL (ref 0.00–0.08)

## 2017-01-25 LAB — BRAIN NATRIURETIC PEPTIDE: B Natriuretic Peptide: 433.2 pg/mL — ABNORMAL HIGH (ref 0.0–100.0)

## 2017-01-25 MED ORDER — GUAIFENESIN ER 600 MG PO TB12
600.0000 mg | ORAL_TABLET | Freq: Every morning | ORAL | Status: DC
  Administered 2017-01-26 – 2017-01-27 (×2): 600 mg via ORAL
  Filled 2017-01-25 (×2): qty 1

## 2017-01-25 MED ORDER — DIPHENOXYLATE-ATROPINE 2.5-0.025 MG PO TABS: 1.0000 | ORAL_TABLET | Freq: Four times a day (QID) | ORAL | Status: DC | PRN

## 2017-01-25 MED ORDER — LORAZEPAM 2 MG/ML IJ SOLN
0.5000 mg | Freq: Once | INTRAMUSCULAR | Status: AC
  Administered 2017-01-25: 0.5 mg via INTRAVENOUS
  Filled 2017-01-25: qty 1

## 2017-01-25 MED ORDER — IOPAMIDOL (ISOVUE-370) INJECTION 76%
INTRAVENOUS | Status: AC
  Filled 2017-01-25: qty 100

## 2017-01-25 MED ORDER — METOPROLOL SUCCINATE ER 25 MG PO TB24
12.5000 mg | ORAL_TABLET | Freq: Every day | ORAL | Status: DC
  Administered 2017-01-26 – 2017-01-27 (×2): 12.5 mg via ORAL
  Filled 2017-01-25 (×2): qty 1

## 2017-01-25 MED ORDER — ZOLPIDEM TARTRATE 5 MG PO TABS
5.0000 mg | ORAL_TABLET | Freq: Every evening | ORAL | Status: DC | PRN
  Administered 2017-01-26: 5 mg via ORAL
  Filled 2017-01-25: qty 1

## 2017-01-25 MED ORDER — FLUTICASONE PROPIONATE 50 MCG/ACT NA SUSP: 1.0000 | Freq: Two times a day (BID) | NASAL | Status: DC | PRN

## 2017-01-25 MED ORDER — AZITHROMYCIN 250 MG PO TABS: 500.0000 mg | ORAL_TABLET | Freq: Every day | ORAL | Status: DC

## 2017-01-25 MED ORDER — PROMETHAZINE HCL 25 MG PO TABS
12.5000 mg | ORAL_TABLET | Freq: Four times a day (QID) | ORAL | Status: DC | PRN
  Administered 2017-01-26: 12.5 mg via ORAL
  Filled 2017-01-25: qty 1

## 2017-01-25 MED ORDER — SODIUM CHLORIDE 0.9 % IV BOLUS (SEPSIS)
1000.0000 mL | Freq: Once | INTRAVENOUS | Status: AC
  Administered 2017-01-25: 1000 mL via INTRAVENOUS

## 2017-01-25 MED ORDER — PANCRELIPASE (LIP-PROT-AMYL) 12000-38000 UNITS PO CPEP
24000.0000 [IU] | ORAL_CAPSULE | Freq: Three times a day (TID) | ORAL | Status: DC
  Administered 2017-01-26 – 2017-01-27 (×4): 24000 [IU] via ORAL
  Filled 2017-01-25 (×4): qty 2

## 2017-01-25 MED ORDER — ALBUTEROL SULFATE (2.5 MG/3ML) 0.083% IN NEBU: 2.5000 mg | INHALATION_SOLUTION | RESPIRATORY_TRACT | Status: DC | PRN

## 2017-01-25 MED ORDER — ACETAMINOPHEN 500 MG PO TABS: 1000.0000 mg | ORAL_TABLET | Freq: Four times a day (QID) | ORAL | Status: DC | PRN

## 2017-01-25 MED ORDER — FLECAINIDE ACETATE 100 MG PO TABS
100.0000 mg | ORAL_TABLET | Freq: Two times a day (BID) | ORAL | Status: DC
  Administered 2017-01-26 – 2017-01-27 (×3): 100 mg via ORAL
  Filled 2017-01-25 (×4): qty 1

## 2017-01-25 MED ORDER — AZITHROMYCIN 250 MG PO TABS: 250.0000 mg | ORAL_TABLET | Freq: Every day | ORAL | Status: DC

## 2017-01-25 MED ORDER — RIVAROXABAN 20 MG PO TABS: 20.0000 mg | ORAL_TABLET | Freq: Every day | ORAL | Status: DC

## 2017-01-25 MED ORDER — IOPAMIDOL (ISOVUE-370) INJECTION 76%
100.0000 mL | Freq: Once | INTRAVENOUS | Status: AC | PRN
  Administered 2017-01-25: 100 mL via INTRAVENOUS

## 2017-01-25 MED ORDER — IPRATROPIUM-ALBUTEROL 0.5-2.5 (3) MG/3ML IN SOLN
3.0000 mL | Freq: Once | RESPIRATORY_TRACT | Status: AC
  Administered 2017-01-25: 3 mL via RESPIRATORY_TRACT
  Filled 2017-01-25: qty 3

## 2017-01-25 MED ORDER — POTASSIUM CHLORIDE ER 10 MEQ PO TBCR
10.0000 meq | EXTENDED_RELEASE_TABLET | Freq: Every day | ORAL | Status: DC
  Administered 2017-01-26 – 2017-01-27 (×2): 10 meq via ORAL
  Filled 2017-01-25 (×4): qty 1

## 2017-01-25 MED ORDER — PREDNISONE 20 MG PO TABS
40.0000 mg | ORAL_TABLET | Freq: Every day | ORAL | Status: DC
  Administered 2017-01-26 – 2017-01-27 (×2): 40 mg via ORAL
  Filled 2017-01-25 (×2): qty 2

## 2017-01-25 MED ORDER — LORAZEPAM 0.5 MG PO TABS
0.5000 mg | ORAL_TABLET | Freq: Three times a day (TID) | ORAL | Status: DC | PRN
  Administered 2017-01-26 (×2): 0.5 mg via ORAL
  Filled 2017-01-25 (×2): qty 1

## 2017-01-25 MED ORDER — HYDROCODONE-ACETAMINOPHEN 5-325 MG PO TABS
1.0000 | ORAL_TABLET | ORAL | Status: DC | PRN
  Administered 2017-01-26: 1 via ORAL
  Administered 2017-01-26 (×2): 2 via ORAL
  Filled 2017-01-25: qty 1
  Filled 2017-01-25 (×2): qty 2
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
## 2017-01-25 NOTE — ED Notes (Signed)
Bed: WA09 Expected date: 01/25/17 Expected time:  Means of arrival:  Comments: EMS 71F SOB
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
## 2017-01-26 ENCOUNTER — Encounter (HOSPITAL_COMMUNITY): Payer: Self-pay | Admitting: *Deleted

## 2017-01-26 DIAGNOSIS — J209 Acute bronchitis, unspecified: Secondary | ICD-10-CM | POA: Diagnosis not present

## 2017-01-26 DIAGNOSIS — D72828 Other elevated white blood cell count: Secondary | ICD-10-CM | POA: Diagnosis not present

## 2017-01-26 DIAGNOSIS — R0602 Shortness of breath: Secondary | ICD-10-CM | POA: Diagnosis not present

## 2017-01-26 DIAGNOSIS — C250 Malignant neoplasm of head of pancreas: Secondary | ICD-10-CM | POA: Diagnosis not present

## 2017-01-26 DIAGNOSIS — I480 Paroxysmal atrial fibrillation: Secondary | ICD-10-CM | POA: Diagnosis not present

## 2017-01-26 LAB — CBC
HCT: 29.4 % — ABNORMAL LOW (ref 36.0–46.0)
Hemoglobin: 9.8 g/dL — ABNORMAL LOW (ref 12.0–15.0)
MCH: 31.1 pg (ref 26.0–34.0)
MCHC: 33.3 g/dL (ref 30.0–36.0)
MCV: 93.3 fL (ref 78.0–100.0)
PLATELETS: 154 10*3/uL (ref 150–400)
RBC: 3.15 MIL/uL — AB (ref 3.87–5.11)
RDW: 14 % (ref 11.5–15.5)
WBC: 36.7 10*3/uL — ABNORMAL HIGH (ref 4.0–10.5)

## 2017-01-26 LAB — INFLUENZA PANEL BY PCR (TYPE A & B)
Influenza A By PCR: NEGATIVE
Influenza B By PCR: NEGATIVE

## 2017-01-26 LAB — BASIC METABOLIC PANEL
Anion gap: 11 (ref 5–15)
BUN: 12 mg/dL (ref 6–20)
CHLORIDE: 102 mmol/L (ref 101–111)
CO2: 21 mmol/L — AB (ref 22–32)
Calcium: 8.6 mg/dL — ABNORMAL LOW (ref 8.9–10.3)
Creatinine, Ser: 0.8 mg/dL (ref 0.44–1.00)
GFR calc Af Amer: >60 mL/min (ref >60)
GLUCOSE: 270 mg/dL — AB (ref 65–99)
POTASSIUM: 4.4 mmol/L (ref 3.5–5.1)
Sodium: 134 mmol/L — ABNORMAL LOW (ref 135–145)

## 2017-01-26 MED ORDER — RIVAROXABAN 20 MG PO TABS
20.0000 mg | ORAL_TABLET | Freq: Every day | ORAL | Status: DC
  Administered 2017-01-26: 20 mg via ORAL
  Filled 2017-01-26: qty 1

## 2017-01-26 MED ORDER — DOXYCYCLINE HYCLATE 100 MG PO TABS
100.0000 mg | ORAL_TABLET | Freq: Two times a day (BID) | ORAL | Status: DC
  Administered 2017-01-26 – 2017-01-27 (×4): 100 mg via ORAL
  Filled 2017-01-26 (×4): qty 1

## 2017-01-26 NOTE — Progress Notes (Signed)
Pt ate a hamburger with cheese on a bun plus some chips. SHE C/O's of bloating ,abdominal pain and nausea. I gave her Phenergan 12.5 mg ,and ativan 0.5

## 2017-01-26 NOTE — Progress Notes (Signed)
PROGRESS NOTE    [name]  TZG:[id number] DOB: Oct 26, 1945 DOA: 01/25/2017 PCP: Savannah Noon, MD   Chief Complaint  Patient presents with  . Shortness of Breath    Brief Narrative:  HPI on 01/25/2017 by Dr. Jennette [name] [name] is a 71 y.o. female with medical history significant of pancreatic cancer, s/p whipple in 2015.  Reoccurrence with lesion in mesentery.  Currently on FOLFOX, just got cycle #9 (I believe) of FOLFOX on 01/21/17 followed by Nulasta.  Patient has had head cold over past couple of days.  Today this migrated down into her chest and caused onset of cough, wheezing, SOB.  Symptoms were severe.  Patient presented to ED.  Assessment & Plan   Acute bronchitis -Patient's presented with shortness of breath, cough, and wheezing which started a few days ago -CXR: Stable examination, borderline cardiomegaly, mild chronic interstitial changes -CTA chest: No evidence of acute abnormality, no pulmonary emboli -Continue doxycycline, nebs, prednisone -Suspect patient's shortness of breath is made worse by her anxiety -Currently not requiring supplemental oxygen -Will order influenza PCR  Leukocytosis -Recently received Neulasta -Also receiving prednisone -Continue to monitor CBC  Pancreatic cancer -Sees Dr. [name] -S/p FOLFOX on 01/21/2017 and last chemo treatment on 02/12/2017  Paroxysmal Atrial fibrillation -CHADSVASC 3 (age, gender, HTN) -Continue Xarelto, metoprolol, and flecanide   Essential hypertension -Continue metoprolol  Anxiety -Continue Ativan  DVT Prophylaxis  xarelto  Code Status: Full  Family Communication: Family at bedside  Disposition Plan: Observation  Consultants None  Procedures  None  Antibiotics   Anti-infectives    Start     Dose/Rate Route Frequency Ordered Stop   01/27/17 1000  azithromycin (ZITHROMAX) tablet 250 mg  Status:  Discontinued     250 mg Oral Daily 01/25/17 2331 01/26/17 0043   01/26/17 1000   azithromycin (ZITHROMAX) tablet 500 mg  Status:  Discontinued     500 mg Oral Daily 01/25/17 2331 01/26/17 0043   01/26/17 0045  doxycycline (VIBRA-TABS) tablet 100 mg     100 mg Oral Every 12 hours 01/26/17 0044        Subjective:   [name] seen and examined today.  Continues to feel that she cannot breath and anxious. Has cough. Complains of night sweats and changing her clothes several times. Denies chest pain, abdominal pain, nausea, vomiting, diarrhea, constipation, dizziness, headache.  Objective:   Vitals:   01/25/17 2321 01/25/17 2345 01/26/17 0030 01/26/17 0551  BP: 99/66  98/73 116/66  Pulse: 84 83 84 67  Resp: 20 20 20 18   Temp:   97.8 F (36.6 C) 97.9 F (36.6 C)  TempSrc:   Oral Oral  SpO2: 98% 97% 96% 98%  Weight:   75.6 kg (166 lb 10.7 oz)   Height:   5\' 3"  (1.6 m)     Intake/Output Summary (Last 24 hours) at 01/26/17 1210 Last data filed at 01/25/17 2232  Gross per 24 hour  Intake             1000 ml  Output                0 ml  Net             1000 ml   Filed Weights   01/26/17 0030  Weight: 75.6 kg (166 lb 10.7 oz)    Exam  General: Well developed, well nourished, NAD, appears stated age  HEENT: NCAT,  mucous membranes moist.   Cardiovascular: S1 S2  auscultated, RRR, no murmurs  Respiratory: Clear to auscultation bilaterally with equal chest rise, occ dry cough  Abdomen: Soft, nontender, nondistended, + bowel sounds  Extremities: warm dry without cyanosis clubbing or edema  Neuro: AAOx3, nonfocal  Skin: Without rashes exudates or nodules  Psych: Anxious, tearful   Data Reviewed: I have personally reviewed following labs and imaging studies  CBC:  Recent Labs Lab 01/21/17 0910 01/25/17 2139 01/26/17 0500  WBC 2.8* 32.4* 36.7*  NEUTROABS 1.3* 27.6*  --   HGB 11.4* 11.5* 9.8*  HCT 34.4* 35.0* 29.4*  MCV 92.5 95.4 93.3  PLT 174 173 654   Basic Metabolic Panel:  Recent Labs Lab 01/21/17 0911 01/25/17 2139  01/26/17 0500  NA 136 135 134*  K 4.4 3.3* 4.4  CL  --  101 102  CO2 24 19* 21*  GLUCOSE 109 129* 270*  BUN 13.7 10 12   CREATININE 0.7 0.80 0.80  CALCIUM 8.9 8.8* 8.6*   GFR: Estimated Creatinine Clearance: 64.6 mL/min (by C-G formula based on SCr of 0.8 mg/dL). Liver Function Tests:  Recent Labs Lab 01/21/17 0911 01/25/17 2139  AST 69* 113*  ALT 43 86*  ALKPHOS 148 181*  BILITOT 0.58 0.7  PROT 6.9 7.1  ALBUMIN 3.8 3.9   No results for input(s): LIPASE, AMYLASE in the last 168 hours. No results for input(s): AMMONIA in the last 168 hours. Coagulation Profile: No results for input(s): INR, PROTIME in the last 168 hours. Cardiac Enzymes: No results for input(s): CKTOTAL, CKMB, CKMBINDEX, TROPONINI in the last 168 hours. BNP (last 3 results) No results for input(s): PROBNP in the last 8760 hours. HbA1C: No results for input(s): HGBA1C in the last 72 hours. CBG: No results for input(s): GLUCAP in the last 168 hours. Lipid Profile: No results for input(s): CHOL, HDL, LDLCALC, TRIG, CHOLHDL, LDLDIRECT in the last 72 hours. Thyroid Function Tests: No results for input(s): TSH, T4TOTAL, FREET4, T3FREE, THYROIDAB in the last 72 hours. Anemia Panel: No results for input(s): VITAMINB12, FOLATE, FERRITIN, TIBC, IRON, RETICCTPCT in the last 72 hours. Urine analysis:    Component Value Date/Time   COLORURINE YELLOW 10/16/2014 1035   APPEARANCEUR CLEAR 10/16/2014 1035   LABSPEC 1.005 10/16/2014 1035   LABSPEC 1.005 04/21/2014 1713   PHURINE 7.5 10/16/2014 1035   GLUCOSEU NEGATIVE 10/16/2014 1035   GLUCOSEU Negative 04/21/2014 1713   HGBUR NEGATIVE 10/16/2014 1035   BILIRUBINUR NEGATIVE 10/16/2014 1035   BILIRUBINUR Negative 04/21/2014 1713   KETONESUR NEGATIVE 10/16/2014 1035   PROTEINUR NEGATIVE 10/16/2014 1035   UROBILINOGEN 0.2 10/16/2014 1035   UROBILINOGEN 0.2 04/21/2014 1713   NITRITE NEGATIVE 10/16/2014 1035   LEUKOCYTESUR NEGATIVE 10/16/2014 1035   LEUKOCYTESUR  Trace 04/21/2014 1713   Sepsis Labs: @LABRCNTIP (procalcitonin:4,lacticidven:4)  )No results found for this or any previous visit (from the past 240 hour(s)).    Radiology Studies: Ct Angio Chest Pe W Or Wo Contrast  Result Date: 01/25/2017 CLINICAL DATA:  71 year old female with acute shortness of breath. History of pancreatic cancer. EXAM: CT ANGIOGRAPHY CHEST WITH CONTRAST TECHNIQUE: Multidetector CT imaging of the chest was performed using the standard protocol during bolus administration of intravenous contrast. Multiplanar CT image reconstructions and MIPs were obtained to evaluate the vascular anatomy. CONTRAST:  100 cc intravenous Isovue 370 COMPARISON:  06/27/2016 head CT, 06/17/2016 chest CT and prior studies FINDINGS: Cardiovascular: Satisfactory opacification of the pulmonary arteries to the segmental level. No evidence of pulmonary embolism. Mild cardiomegaly and ectasia of the ascending thoracic aorta again noted.  No pericardial effusion. Mediastinum/Nodes: No enlarged mediastinal, hilar, or axillary lymph nodes. Thyroid gland, trachea, and esophagus demonstrate no significant findings. Lungs/Pleura: There is no evidence of airspace disease, mass, consolidation, pleural effusion or pneumothorax. Very soft small subpleural nodules within the left upper lobe are unchanged. Upper Abdomen: No acute abnormality.  Hepatic steatosis identified. Musculoskeletal: No acute abnormality or suspicious focal lesion. Review of the MIP images confirms the above findings. IMPRESSION: No evidence of acute abnormality.  No evidence of pulmonary emboli. Mild cardiomegaly and ectatic ascending thoracic aorta. Hepatic steatosis. Electronically Signed   By: Margarette Canada M.D.   On: 01/25/2017 23:46   Dg Chest Portable 1 View  Result Date: 01/25/2017 CLINICAL DATA:  Shortness of breath. History of pancreatic and endometrial cancer, atrial fibrillation, pulmonary embolism. EXAM: PORTABLE CHEST 1 VIEW COMPARISON:   Chest radiograph October 21, 2014 FINDINGS: The cardiac silhouette is upper limits of normal in size, mildly calcified aortic knob. Mild interstitial prominence without pleural effusion or focal consolidation. No pneumothorax. Single lumen LEFT chest Port-A-Cath distal tip projects in mid superior vena cava, unchanged. No pneumothorax. Osteopenia. LEFT shoulder arthroplasty. IMPRESSION: Stable examination: Borderline cardiomegaly and mild chronic interstitial changes. Electronically Signed   By: Elon Alas M.D.   On: 01/25/2017 23:20     Scheduled Meds: . doxycycline  100 mg Oral Q12H  . flecainide  100 mg Oral BID  . guaiFENesin  600 mg Oral q morning - 10a  . lipase/protease/amylase  24,000 Units Oral TID AC  . metoprolol succinate  12.5 mg Oral Daily  . potassium chloride  10 mEq Oral Daily  . predniSONE  40 mg Oral Q breakfast  . rivaroxaban  20 mg Oral Q supper   Continuous Infusions:   LOS: 0 days   Time Spent in minutes   30 minutes  Kristina Mcnorton D.O. on 01/26/2017 at 12:10 PM  Between 7am to 7pm - Pager - [phone]  After 7pm go to [email or web address] - password [email or web address]  And look for the night coverage person covering for me after hours  [facility] Office  [phone]

## 2017-01-27 DIAGNOSIS — I480 Paroxysmal atrial fibrillation: Secondary | ICD-10-CM | POA: Diagnosis not present

## 2017-01-27 DIAGNOSIS — J209 Acute bronchitis, unspecified: Secondary | ICD-10-CM | POA: Diagnosis not present

## 2017-01-27 DIAGNOSIS — C250 Malignant neoplasm of head of pancreas: Secondary | ICD-10-CM | POA: Diagnosis not present

## 2017-01-27 DIAGNOSIS — D72828 Other elevated white blood cell count: Secondary | ICD-10-CM | POA: Diagnosis not present

## 2017-01-27 LAB — BASIC METABOLIC PANEL
Anion gap: 7 (ref 5–15)
BUN: 10 mg/dL (ref 6–20)
CO2: 27 mmol/L (ref 22–32)
Calcium: 8.6 mg/dL — ABNORMAL LOW (ref 8.9–10.3)
Chloride: 104 mmol/L (ref 101–111)
Creatinine, Ser: 0.58 mg/dL (ref 0.44–1.00)
GFR calc Af Amer: >60 mL/min (ref >60)
GFR calc non Af Amer: >60 mL/min (ref >60)
Glucose, Bld: 88 mg/dL (ref 65–99)
Potassium: 3.7 mmol/L (ref 3.5–5.1)
Sodium: 138 mmol/L (ref 135–145)

## 2017-01-27 LAB — CBC
HCT: 29.4 % — ABNORMAL LOW (ref 36.0–46.0)
HEMOGLOBIN: 9.7 g/dL — AB (ref 12.0–15.0)
MCH: 31 pg (ref 26.0–34.0)
MCHC: 33 g/dL (ref 30.0–36.0)
MCV: 93.9 fL (ref 78.0–100.0)
Platelets: 157 10*3/uL (ref 150–400)
RBC: 3.13 MIL/uL — AB (ref 3.87–5.11)
RDW: 14 % (ref 11.5–15.5)
WBC: 27.5 10*3/uL — ABNORMAL HIGH (ref 4.0–10.5)

## 2017-01-27 MED ORDER — HEPARIN SOD (PORK) LOCK FLUSH 100 UNIT/ML IV SOLN
500.0000 [IU] | Freq: Once | INTRAVENOUS | Status: AC
Start: 2017-01-27 — End: 2017-01-27
  Administered 2017-01-27: 500 [IU] via INTRAVENOUS
  Filled 2017-01-27: qty 5

## 2017-01-27 MED ORDER — ALBUTEROL SULFATE HFA 108 (90 BASE) MCG/ACT IN AERS: 2.0000 | INHALATION_SPRAY | Freq: Four times a day (QID) | RESPIRATORY_TRACT | 1 refills | Status: AC | PRN

## 2017-01-27 MED ORDER — DOXYCYCLINE HYCLATE 100 MG PO TABS: 100.0000 mg | ORAL_TABLET | Freq: Two times a day (BID) | ORAL | 0 refills | Status: DC

## 2017-01-27 MED ORDER — BENZONATATE 100 MG PO CAPS: 100.0000 mg | ORAL_CAPSULE | Freq: Three times a day (TID) | ORAL | 0 refills | Status: DC | PRN

## 2017-01-27 MED ORDER — PREDNISONE 10 MG PO TABS: ORAL_TABLET | ORAL | 0 refills | Status: DC

## 2017-01-27 NOTE — Discharge Summary (Signed)
Physician Discharge Summary  SURIYA KOVARIK WUJ:[id number] DOB: 1946-02-22 DOA: 01/25/2017  PCP: Chesley Noon, MD  Admit date: 01/25/2017 Discharge date: 01/27/2017  Time spent: 45 minutes  Recommendations for Outpatient Follow-up:  Patient will be discharged to home.  Patient will need to follow up with primary care provider within one week of discharge.  Follow up with Dr. [name], oncology. Patient should continue medications as prescribed.  Patient should follow a regular diet.   Discharge Diagnoses:  Acute bronchitis Leukocytosis Pancreatic cancer Proximal atrial fibrillation Essential hypertension Anxiety  Discharge Condition: Stable  Diet recommendation: Regular  Filed Weights   01/26/17 0030  Weight: 75.6 kg (166 lb 10.7 oz)    History of present illness:  on 01/25/2017 by Dr. Alphonse Guild Adkinsis a 71 y.o.femalewith medical history significant of pancreatic cancer, s/p whipple in 2015. Reoccurrence with lesion in mesentery. Currently on FOLFOX, just got cycle #9 (I believe) of FOLFOX on 01/21/17 followed by Nulasta. Patient has had head cold over past couple of days. Today this migrated down into her chest and caused onset of cough, wheezing, SOB. Symptoms were severe. Patient presented to ED.  Hospital Course:  Acute bronchitis -Patient's presented with shortness of breath, cough, and wheezing which started a few days prior to admission -CXR: Stable examination, borderline cardiomegaly, mild chronic interstitial changes -CTA chest: No evidence of acute abnormality, no pulmonary emboli -Continue doxycycline and prednisone -Suspect patient's shortness of breath is made worse by her anxiety -Currently not requiring supplemental oxygen -Influenza PCR negative -Will discharge patient with tessalon, albuterol inhaler, doxycycline, and prednisone  Leukocytosis -Recently received Neulasta -Improving.   Pancreatic cancer -Sees Dr.  [name] -S/p FOLFOX on 01/21/2017 and last chemo treatment on 02/12/2017  Paroxysmal Atrial fibrillation -CHADSVASC 3 (age, gender, HTN) -Continue Xarelto, metoprolol, and flecanide   Essential hypertension -Continue metoprolol  Anxiety -Continue Ativan  Procedures: None  Consultations: None  Discharge Exam: Vitals:   01/26/17 2045 01/27/17 0523  BP: (!) 109/56 99/67  Pulse: 69 (!) 59  Resp: 20 20  Temp: 98.4 F (36.9 C) 97.7 F (36.5 C)   Patient states she is feeling better. Her feel short of breath but is very anxious about becoming short of breath when she goes home. Continues to have dry cough. Denies any chest pain, abdominal pain, nausea or vomiting, diarrhea or constipation, dizziness or headache.  Exam  General: Well developed, well nourished, No apparent distress  HEENT: NCAT,  mucous membranes moist.   Cardiovascular: S1 S2 auscultated, RRR, no murmurs  Respiratory: Clear to auscultation bilaterally with equal chest rise, occ dry cough  Abdomen: Soft, nontender, nondistended, + bowel sounds  Extremities: warm dry without cyanosis clubbing or edema  Neuro: AAOx3, nonfocal  Psych: Appropriate mood and affect, pleasant  Discharge Instructions Discharge Instructions    Discharge instructions    Complete by:  As directed    Patient will be discharged to home.  Patient will need to follow up with primary care provider within one week of discharge.  Follow up with Dr. [name], oncology. Patient should continue medications as prescribed.  Patient should follow a regular diet.     Current Discharge Medication List    START taking these medications   Details  albuterol (PROVENTIL HFA;VENTOLIN HFA) 108 (90 Base) MCG/ACT inhaler Inhale 2 puffs into the lungs every 6 (six) hours as needed for wheezing or shortness of breath. Qty: 1 Inhaler, Refills: 1    benzonatate (TESSALON PERLES) 100 MG capsule Take  1 capsule (100 mg total) by mouth 3 (three) times  daily as needed for cough. Qty: 30 capsule, Refills: 0    doxycycline (VIBRA-TABS) 100 MG tablet Take 1 tablet (100 mg total) by mouth every 12 (twelve) hours. Qty: 8 tablet, Refills: 0    predniSONE (DELTASONE) 10 MG tablet Take 4 tabs x 1 day, then 3 tabs x 2 days, then 2 tabs x 2 days, then 1 tab x 2 days Qty: 16 tablet, Refills: 0      CONTINUE these medications which have NOT CHANGED   Details  acetaminophen (TYLENOL) 500 MG tablet Take 1,000 mg by mouth every 6 (six) hours as needed for pain.     dexamethasone (DECADRON) 4 MG tablet Take 1 tablet (4 mg total) by mouth 2 (two) times daily with a meal. Take for 3 days after chemo. Qty: 6 tablet, Refills: 3   Associated Diagnoses: Cancer of head of pancreas (HCC)    diphenoxylate-atropine (LOMOTIL) 2.5-0.025 MG tablet Take 1-2 tablets by mouth 4 (four) times daily as needed for diarrhea or loose stools. Qty: 30 tablet, Refills: 0   Associated Diagnoses: Cancer of head of pancreas (HCC)    flecainide (TAMBOCOR) 100 MG tablet TAKE 1 TABLET (100 MG TOTAL) BY MOUTH 2 (TWO) TIMES DAILY. Qty: 60 tablet, Refills: 0    fluticasone (FLONASE) 50 MCG/ACT nasal spray Place 1 spray into both nostrils 2 (two) times daily as needed for allergies.     guaiFENesin (MUCINEX) 600 MG 12 hr tablet Take 600 mg by mouth every morning.     HYDROcodone-acetaminophen (NORCO/VICODIN) 5-325 MG tablet Take 1-2 tablets by mouth every 4 (four) hours as needed for moderate pain. Qty: 60 tablet, Refills: 0   Associated Diagnoses: Cancer of head of pancreas (HCC)    lidocaine-prilocaine (EMLA) cream Apply small amount over port area 1-2 hours prior to treatment and cover with plastic wrap.  DO NOT RUB IN. Qty: 30 g, Refills: prn    lipase/protease/amylase (CREON) 12000 UNITS CPEP capsule Take 2 capsules (24,000 Units total) by mouth 3 (three) times daily before meals. Qty: 180 capsule, Refills: 2   Associated Diagnoses: Cancer of head of pancreas ([place]);  Adenocarcinoma of head of pancreas (HCC)    LORazepam (ATIVAN) 1 MG tablet Take 0.5 tablets (0.5 mg total) by mouth every 8 (eight) hours as needed for anxiety. Qty: 45 tablet, Refills: 0   Associated Diagnoses: Cancer of head of pancreas (HCC)    metoprolol succinate (TOPROL-XL) 25 MG 24 hr tablet Take 0.5 tablets (12.5 mg total) by mouth daily. Take 1/2 tablet by mouth daily Qty: 45 tablet, Refills: 3    potassium chloride 20 MEQ TBCR Take 10 mEq by mouth daily. Qty: 30 tablet, Refills: 0    promethazine (PHENERGAN) 12.5 MG tablet Take 1 tablet (12.5 mg total) by mouth every 6 (six) hours as needed for nausea or vomiting. Qty: 30 tablet, Refills: 1   Associated Diagnoses: Adenocarcinoma of head of pancreas (HCC)    XARELTO 20 MG TABS tablet TAKE 1 TABLET BY MOUTH EVERY DAY Qty: 90 tablet, Refills: 1    zolpidem (AMBIEN CR) 6.25 MG CR tablet Take 6.25 mg by mouth at bedtime.    Associated Diagnoses: Cancer of head of pancreas (HCC)       Allergies  Allergen Reactions  . Ace Inhibitors     Cough  . Codeine Other (See Comments)    Stomach pain  . Scopolamine Other (See Comments)    Dizzy, "  lost control of my body", fell down and cracked a rib  . Sulfa Antibiotics Hives   Follow-up Information    Chesley Noon, MD. Schedule an appointment as soon as possible for a visit in 1 week(s).   Specialty:  Family Medicine Why:  Hospital follow up Contact information: [place] [address] [phone]        Ladell Pier, MD. Schedule an appointment as soon as possible for a visit in 1 week(s).   Specialty:  Oncology Why:  Follow up Contact information: [place] [address] [phone]            The results of significant diagnostics from this hospitalization (including imaging, microbiology, ancillary and laboratory) are listed below for reference.    Significant Diagnostic Studies: Ct Angio Chest Pe W Or Wo  Contrast  Result Date: 01/25/2017 CLINICAL DATA:  71 year old female with acute shortness of breath. History of pancreatic cancer. EXAM: CT ANGIOGRAPHY CHEST WITH CONTRAST TECHNIQUE: Multidetector CT imaging of the chest was performed using the standard protocol during bolus administration of intravenous contrast. Multiplanar CT image reconstructions and MIPs were obtained to evaluate the vascular anatomy. CONTRAST:  100 cc intravenous Isovue 370 COMPARISON:  06/27/2016 head CT, 06/17/2016 chest CT and prior studies FINDINGS: Cardiovascular: Satisfactory opacification of the pulmonary arteries to the segmental level. No evidence of pulmonary embolism. Mild cardiomegaly and ectasia of the ascending thoracic aorta again noted. No pericardial effusion. Mediastinum/Nodes: No enlarged mediastinal, hilar, or axillary lymph nodes. Thyroid gland, trachea, and esophagus demonstrate no significant findings. Lungs/Pleura: There is no evidence of airspace disease, mass, consolidation, pleural effusion or pneumothorax. Very soft small subpleural nodules within the left upper lobe are unchanged. Upper Abdomen: No acute abnormality.  Hepatic steatosis identified. Musculoskeletal: No acute abnormality or suspicious focal lesion. Review of the MIP images confirms the above findings. IMPRESSION: No evidence of acute abnormality.  No evidence of pulmonary emboli. Mild cardiomegaly and ectatic ascending thoracic aorta. Hepatic steatosis. Electronically Signed   By: Margarette Canada M.D.   On: 01/25/2017 23:46   Ir Cv Line Injection  Result Date: 01/01/2017 CLINICAL DATA:  History of pancreatic carcinoma and status post Port-A-Cath placement on 10/21/2014 by Dr. Barry Dienes. Pain with flushing of port. EXAM: CONTRAST INJECTION OF PORT A CATH UNDER FLUOROSCOPY CONTRAST:  7 mL Isovue-300 FLUOROSCOPY TIME:  6 seconds.  Forty-three mGy. PROCEDURE: Contrast was administered via the indwelling port after it was accessed. Fluoroscopic spot images  were obtained of the catheter during injection. FINDINGS: Since placement, the subcutaneous tunneled portion of the port catheter shows slight buckling in the soft tissues of the left infraclavicular region. This is not causing any significant kinking or obstruction of the catheter. The catheter tip remains roughly at the level of the mid SVC. Contrast injection demonstrates normal opacification of the port reservoir and attached catheter tubing without evidence of contrast extravasation. There is mild fibrin sheath formation along the tip of the catheter causing some irregular flow of contrast from the catheter tip. However, there remains good flow from the catheter tip. IMPRESSION: Intact port catheter without evidence of contrast extravasation or significant catheter kinking. There is slight buckling of the tunneled portion of the catheter in the infraclavicular region since placement. This has not cause significant retraction of the catheter tip which remains in the SVC. Mild fibrin sheath formation is seen at the tip of the catheter without causing significant flow obstruction. Electronically Signed   By: Eulas Post  Kathlene Cote M.D.   On: 01/01/2017 17:14   Dg Chest Portable 1 View  Result Date: 01/25/2017 CLINICAL DATA:  Shortness of breath. History of pancreatic and endometrial cancer, atrial fibrillation, pulmonary embolism. EXAM: PORTABLE CHEST 1 VIEW COMPARISON:  Chest radiograph October 21, 2014 FINDINGS: The cardiac silhouette is upper limits of normal in size, mildly calcified aortic knob. Mild interstitial prominence without pleural effusion or focal consolidation. No pneumothorax. Single lumen LEFT chest Port-A-Cath distal tip projects in mid superior vena cava, unchanged. No pneumothorax. Osteopenia. LEFT shoulder arthroplasty. IMPRESSION: Stable examination: Borderline cardiomegaly and mild chronic interstitial changes. Electronically Signed   By: Elon Alas M.D.   On: 01/25/2017 23:20     Microbiology: No results found for this or any previous visit (from the past 240 hour(s)).   Labs: Basic Metabolic Panel:  Recent Labs Lab 01/21/17 0911 01/25/17 2139 01/26/17 0500 01/27/17 0532  NA 136 135 134* 138  K 4.4 3.3* 4.4 3.7  CL  --  101 102 104  CO2 24 19* 21* 27  GLUCOSE 109 129* 270* 88  BUN 13.7 10 12 10   CREATININE 0.7 0.80 0.80 0.58  CALCIUM 8.9 8.8* 8.6* 8.6*   Liver Function Tests:  Recent Labs Lab 01/21/17 0911 01/25/17 2139  AST 69* 113*  ALT 43 86*  ALKPHOS 148 181*  BILITOT 0.58 0.7  PROT 6.9 7.1  ALBUMIN 3.8 3.9   No results for input(s): LIPASE, AMYLASE in the last 168 hours. No results for input(s): AMMONIA in the last 168 hours. CBC:  Recent Labs Lab 01/21/17 0910 01/25/17 2139 01/26/17 0500 01/27/17 0532  WBC 2.8* 32.4* 36.7* 27.5*  NEUTROABS 1.3* 27.6*  --   --   HGB 11.4* 11.5* 9.8* 9.7*  HCT 34.4* 35.0* 29.4* 29.4*  MCV 92.5 95.4 93.3 93.9  PLT 174 173 154 157   Cardiac Enzymes: No results for input(s): CKTOTAL, CKMB, CKMBINDEX, TROPONINI in the last 168 hours. BNP: BNP (last 3 results)  Recent Labs  01/25/17 2139  BNP 433.2*    ProBNP (last 3 results) No results for input(s): PROBNP in the last 8760 hours.  CBG: No results for input(s): GLUCAP in the last 168 hours.     SignedCristal Ford  [facility] 01/27/2017, 10:15 AM

## 2017-01-27 NOTE — Progress Notes (Signed)
Patient discharge instructions reviewed with patient and daughter with teach-back. Patient and daughter verbalize understanding. Patient refused wheelchair for discharge. Patient's daughter is taking her home. Patient is not in distress.

## 2017-01-27 NOTE — Discharge Instructions (Signed)

## 2017-01-30 ENCOUNTER — Other Ambulatory Visit: Payer: Self-pay

## 2017-01-30 NOTE — Telephone Encounter (Signed)
Verbal Order Info   Action Created on Order Mode Entered by Comment Responsible Provider Signed by Signed on  Ordering 05/13/16 [phone] Rx Refill Juventino Slovak, CMA   Signature Not Required   This Order Has Been Discontinued   Order Status Reason By On  Discontinued Discontinued by provider San Morelle, RN 01/21/17 1207      Medication Detail    Disp Refills Start End   losartan (COZAAR) 50 MG tablet (Discontinued) 30 tablet 8 05/13/2016 01/21/2017   Sig: TAKE 1 TABLET BY MOUTH EVERY DAY   Reason for Discontinue: Discontinued by provider   E-Prescribing Status: Receipt confirmed by pharmacy (05/13/2016 9:39 AM EDT)

## 2017-01-31 ENCOUNTER — Other Ambulatory Visit: Payer: Self-pay | Admitting: Cardiology

## 2017-02-08 ENCOUNTER — Other Ambulatory Visit: Payer: Self-pay | Admitting: Oncology

## 2017-02-12 ENCOUNTER — Ambulatory Visit (HOSPITAL_BASED_OUTPATIENT_CLINIC_OR_DEPARTMENT_OTHER): Payer: Medicare Other | Admitting: Nurse Practitioner

## 2017-02-12 ENCOUNTER — Ambulatory Visit: Payer: Medicare Other

## 2017-02-12 ENCOUNTER — Other Ambulatory Visit: Payer: Self-pay | Admitting: Physician Assistant

## 2017-02-12 ENCOUNTER — Telehealth: Payer: Self-pay | Admitting: Oncology

## 2017-02-12 ENCOUNTER — Other Ambulatory Visit (HOSPITAL_BASED_OUTPATIENT_CLINIC_OR_DEPARTMENT_OTHER): Payer: Medicare Other

## 2017-02-12 VITALS — BP 123/72 | HR 55 | Temp 98.1°F | Resp 20 | Ht 63.0 in | Wt 159.1 lb

## 2017-02-12 DIAGNOSIS — C541 Malignant neoplasm of endometrium: Secondary | ICD-10-CM | POA: Diagnosis not present

## 2017-02-12 DIAGNOSIS — I4891 Unspecified atrial fibrillation: Secondary | ICD-10-CM | POA: Diagnosis not present

## 2017-02-12 DIAGNOSIS — Z95828 Presence of other vascular implants and grafts: Secondary | ICD-10-CM

## 2017-02-12 DIAGNOSIS — Z452 Encounter for adjustment and management of vascular access device: Secondary | ICD-10-CM | POA: Diagnosis not present

## 2017-02-12 DIAGNOSIS — Z7901 Long term (current) use of anticoagulants: Secondary | ICD-10-CM | POA: Diagnosis not present

## 2017-02-12 DIAGNOSIS — C250 Malignant neoplasm of head of pancreas: Secondary | ICD-10-CM

## 2017-02-12 DIAGNOSIS — G620 Drug-induced polyneuropathy: Secondary | ICD-10-CM

## 2017-02-12 LAB — CBC WITH DIFFERENTIAL/PLATELET
BASO%: 0.6 % (ref 0.0–2.0)
BASOS ABS: 0 10*3/uL (ref 0.0–0.1)
EOS%: 1.7 % (ref 0.0–7.0)
Eosinophils Absolute: 0.1 10*3/uL (ref 0.0–0.5)
HEMATOCRIT: 31.4 % — AB (ref 34.8–46.6)
HEMOGLOBIN: 10.3 g/dL — AB (ref 11.6–15.9)
LYMPH%: 31.4 % (ref 14.0–49.7)
MCH: 30.7 pg (ref 25.1–34.0)
MCHC: 32.8 g/dL (ref 31.5–36.0)
MCV: 93.5 fL (ref 79.5–101.0)
MONO#: 0.6 10*3/uL (ref 0.1–0.9)
MONO%: 16.1 % — AB (ref 0.0–14.0)
NEUT%: 50.2 % (ref 38.4–76.8)
NEUTROS ABS: 1.8 10*3/uL (ref 1.5–6.5)
Platelets: 165 10*3/uL (ref 145–400)
RBC: 3.36 10*6/uL — ABNORMAL LOW (ref 3.70–5.45)
RDW: 14 % (ref 11.2–14.5)
WBC: 3.5 10*3/uL — AB (ref 3.9–10.3)
lymph#: 1.1 10*3/uL (ref 0.9–3.3)

## 2017-02-12 LAB — COMPREHENSIVE METABOLIC PANEL
ALK PHOS: 151 U/L — AB (ref 40–150)
ALT: 45 U/L (ref 0–55)
ANION GAP: 8 meq/L (ref 3–11)
AST: 50 U/L — ABNORMAL HIGH (ref 5–34)
Albumin: 3.5 g/dL (ref 3.5–5.0)
BILIRUBIN TOTAL: 0.76 mg/dL (ref 0.20–1.20)
BUN: 15 mg/dL (ref 7.0–26.0)
CALCIUM: 9.2 mg/dL (ref 8.4–10.4)
CO2: 25 meq/L (ref 22–29)
CREATININE: 0.7 mg/dL (ref 0.6–1.1)
Chloride: 105 mEq/L (ref 98–109)
Glucose: 87 mg/dl (ref 70–140)
Potassium: 4.2 mEq/L (ref 3.5–5.1)
Sodium: 138 mEq/L (ref 136–145)
TOTAL PROTEIN: 6.7 g/dL (ref 6.4–8.3)

## 2017-02-12 MED ORDER — HEPARIN SOD (PORK) LOCK FLUSH 100 UNIT/ML IV SOLN
500.0000 [IU] | Freq: Once | INTRAVENOUS | Status: AC | PRN
  Administered 2017-02-12: 500 [IU] via INTRAVENOUS
  Filled 2017-02-12: qty 5

## 2017-02-12 MED ORDER — SODIUM CHLORIDE 0.9 % IJ SOLN
10.0000 mL | INTRAMUSCULAR | Status: DC | PRN
  Administered 2017-02-12: 10 mL via INTRAVENOUS
  Filled 2017-02-12: qty 10

## 2017-02-12 NOTE — Patient Instructions (Signed)

## 2017-02-12 NOTE — Progress Notes (Signed)
[place] OFFICE PROGRESS NOTE   Diagnosis:  Pancreas cancer  INTERVAL HISTORY:   [name] returns as scheduled. She completed cycle 8 FOLFOX 01/21/2017. She was hospitalized 01/25/2017 through 01/27/2017 with acute bronchitis. She notes improvement in the wheezing. She is using an albuterol inhaler. She denies nausea/vomiting. No mouth sores. No diarrhea. She has had no abdominal pain for the past 3 weeks. She notes persistent numbness in the fingertips and feet. She had difficulty buttoning her shirt earlier today. She also notes some difficulty walking.  Objective:  Vital signs in last 24 hours:  Blood pressure 123/72, pulse (!) 55, temperature 98.1 F (36.7 C), temperature source Oral, resp. rate 20, height 5\' 3"  (1.6 m), weight 159 lb 1.6 oz (72.2 kg), SpO2 99 %.    HEENT: No thrush or ulcers. Resp: Lungs clear bilaterally. Cardio: Regular rate and rhythm. GI: Abdomen soft and nontender. No hepatomegaly. Vascular: No leg edema. Neuro: Moderate decrease in vibratory sense over the fingertips bilaterally.   Portacath without erythema.  Lab Results:  Lab Results  Component Value Date   WBC 3.5 (L) 02/12/2017   HGB 10.3 (L) 02/12/2017   HCT 31.4 (L) 02/12/2017   MCV 93.5 02/12/2017   PLT 165 02/12/2017   NEUTROABS 1.8 02/12/2017    Imaging:  No results found.  Medications: I have reviewed the patient's current medications.  Assessment/Plan: 1. 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  Gemcitabine held 11/02/2014 due to neutropenia.  Gemcitabine 11/09/2014 dose reduced 800 mg/m.  Gemcitabine held 11/16/2014  due to neutropenia.  Gemcitabine resumed 11/23/2014 every 2 week schedule.  Cycle 6 gemcitabine 01/05/2015  Cycle 7 gemcitabine 01/18/2015  Cycle 8 gemcitabine 02/01/2015  Cycle 9 gemcitabine 02/15/2015  Cycle 10 gemcitabine 03/01/2015  Cycle 11 gemcitabine 03/15/2015  Cycle 12 gemcitabine 03/29/2015  Elevated CA 19-01 May 2016  CTs 06/17/2016-new soft tissue mass at the root of the mesentery with vascular involvement  PET 06/27/2016-hypermetabolic activity associated with soft tissue adjacent to surgical clips in the central mesentery  Status post SBRTto the mesenteric mass completed 07/26/2016  CT abdomen/pelvis 08/23/2016 -mesenteric mass stable to slightly decreased in size.  Cycle 1 FOLFOX 09/03/2016  Cycle 2 FOLFOX 09/30/2016  Cycle 3 FOLFOX 10/14/2016   Cycle 4 FOLFOX 11/05/2016 (5-FU bolus eliminated and 5-FU pump dose reduced)  Cycle 5 FOLFOX 11/19/2016  CT abdomen/pelvis 11/28/2016-decreased size of soft tissue at the small bowel mesentery, mild asymmetric soft tissue at the left vaginal cuff  Cycle 6 FOLFOX 12/10/2016 (5-FU infusion further reduced and oxaliplatin reduced)  Cycle 7 FOLFOX 12/31/2016  Cycle 8 FOLFOX 01/21/2017   2. 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 Admission with post ERCP pancreatitis 06/16/2014  4. History of abdominal pain secondary to #1  5. Pulmonary embolism diagnosed on a CT of the abdomen 09/16/2014  Negative lower extremity Dopplers 09/17/2014  6. Multiple orthopedic surgical procedures  7. Endometrial cancer,stage IA, grade 1 endometrioid adenocarcinoma, 18% myometrial invasion, no lymphovascular space involvement, negative washings  Status post robotic total hysterectomy and bilateral salpingo-oophorectomy 11/30/2010  Recurrent tumor left  lateral vagina status post  biopsy 11/24/2014 with pathology confirming adenocarcinoma with focal squamous differentiation consistent with endometrial adenocarcinoma  Staging CT scans 12/06/2014 with no evidence of local pancreatic cancer recurrence. Small fluid collection adjacent to the left adrenal gland. Severe hepatic steatosis. No evidence of local extension of endometrial carcinoma. Carcinoma not well-defined at the vaginal cuff. 5 mm right external iliac lymph node. 3.6 mm left external iliac lymph node  Brachytherapy initiated 12/22/2014, completed 01/19/2015  CT abdomen/pelvis 07/24/2015 revealed a 3 x 4 cm soft tissue focus at the vaginal apex  PET scan 08/11/2015 revealed no mass at the vaginal apex and no evidence of metastatic disease  CT 11/28/2016-mild asymmetric soft tissue at the left vaginal cuff  8. History of atrial fibrillation-maintained on xarelto  9. Family history of multiple cancers-negative CancerNext gene panel  10. Prolonged nausea following the pancreaticoduodenectomy. Improved 10/26/2014.  11. Port-A-Cath placement 10/21/2014.  12. History of Neutropenia secondary to chemotherapy   13. Diarrhea. Question pancreatic insufficiency. Pancreatic enzyme replacement initiated 01/05/2015. Recurrent diarrhea following a course of antibiotics March 2017.  14. History of positional vertigo-resolved  15. Pain-abdominaland back pain-likely secondary to the mesenteric mass  16. Neutropenia secondary to chemotherapy 09/18/2016.  17. Delayed nausea and diarrhea following FOLFOX. Emend added with cycle 2. Decadron prophylaxis added with cycle 3  18. Diarrhea 10/28/2016. Question related to chemotherapy. Negative C. difficile testing 10/31/2016.  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 Treatment held.    Disposition: [name] appears stable. She has completed 8 cycles of FOLFOX chemotherapy. She has progressive neuropathy symptoms  involving the hands and feet interfering with activity. We decided to hold today's treatment. She will return for a follow-up visit in 2 weeks with restaging CT scans a few days prior.  Plan reviewed with Dr. [name].  Ned Card ANP/GNP-BC   02/12/2017  12:41 PM

## 2017-02-12 NOTE — Telephone Encounter (Signed)
Scheduled appt per 5/23 LOS - unable to scheduled 6/6 due to availability in the treatment area - will call patient back with that appt when scheduled. [facility] to contact patient with CT schedule

## 2017-02-13 ENCOUNTER — Other Ambulatory Visit: Payer: Self-pay | Admitting: Family Medicine

## 2017-02-13 DIAGNOSIS — Z1231 Encounter for screening mammogram for malignant neoplasm of breast: Secondary | ICD-10-CM

## 2017-02-13 LAB — CANCER ANTIGEN 19-9: CAN 19-9: 53 U/mL — AB (ref 0–35)

## 2017-02-14 ENCOUNTER — Ambulatory Visit: Payer: Medicare Other

## 2017-02-18 ENCOUNTER — Ambulatory Visit (INDEPENDENT_AMBULATORY_CARE_PROVIDER_SITE_OTHER): Payer: Medicare Other | Admitting: Cardiology

## 2017-02-18 ENCOUNTER — Encounter: Payer: Self-pay | Admitting: Oncology

## 2017-02-18 VITALS — BP 128/68 | HR 68 | Ht 63.0 in | Wt 162.4 lb

## 2017-02-18 DIAGNOSIS — G4733 Obstructive sleep apnea (adult) (pediatric): Secondary | ICD-10-CM

## 2017-02-18 DIAGNOSIS — I2782 Chronic pulmonary embolism: Secondary | ICD-10-CM | POA: Diagnosis not present

## 2017-02-18 DIAGNOSIS — C250 Malignant neoplasm of head of pancreas: Secondary | ICD-10-CM

## 2017-02-18 DIAGNOSIS — I480 Paroxysmal atrial fibrillation: Secondary | ICD-10-CM | POA: Diagnosis not present
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
## 2017-02-18 NOTE — Patient Instructions (Signed)

## 2017-02-20 ENCOUNTER — Telehealth: Payer: Self-pay | Admitting: Oncology

## 2017-02-20 NOTE — Telephone Encounter (Signed)
Confirmed 6/4 appts with pt per sch msg

## 2017-02-23 ENCOUNTER — Other Ambulatory Visit: Payer: Self-pay | Admitting: Physician Assistant

## 2017-02-24 ENCOUNTER — Ambulatory Visit (HOSPITAL_COMMUNITY)
Admission: RE | Admit: 2017-02-24 | Discharge: 2017-02-24 | Disposition: A | Discharge status: Home / Self Care | Payer: Medicare Other | Source: Ambulatory Visit | Attending: Nurse Practitioner | Admitting: Nurse Practitioner

## 2017-02-24 ENCOUNTER — Ambulatory Visit (HOSPITAL_BASED_OUTPATIENT_CLINIC_OR_DEPARTMENT_OTHER): Payer: Medicare Other

## 2017-02-24 ENCOUNTER — Other Ambulatory Visit: Payer: Medicare Other

## 2017-02-24 ENCOUNTER — Encounter (HOSPITAL_COMMUNITY): Payer: Self-pay

## 2017-02-24 DIAGNOSIS — C250 Malignant neoplasm of head of pancreas: Secondary | ICD-10-CM | POA: Diagnosis present

## 2017-02-24 DIAGNOSIS — Z90710 Acquired absence of both cervix and uterus: Secondary | ICD-10-CM | POA: Diagnosis not present

## 2017-02-24 DIAGNOSIS — Z90411 Acquired partial absence of pancreas: Secondary | ICD-10-CM | POA: Insufficient documentation

## 2017-02-24 DIAGNOSIS — R935 Abnormal findings on diagnostic imaging of other abdominal regions, including retroperitoneum: Secondary | ICD-10-CM | POA: Insufficient documentation

## 2017-02-24 DIAGNOSIS — Z452 Encounter for adjustment and management of vascular access device: Secondary | ICD-10-CM

## 2017-02-24 DIAGNOSIS — Z95828 Presence of other vascular implants and grafts: Secondary | ICD-10-CM

## 2017-02-24 DIAGNOSIS — C541 Malignant neoplasm of endometrium: Secondary | ICD-10-CM

## 2017-02-24 DIAGNOSIS — I700 Atherosclerosis of aorta: Secondary | ICD-10-CM | POA: Diagnosis not present

## 2017-02-24 MED ORDER — IOPAMIDOL (ISOVUE-300) INJECTION 61%
100.0000 mL | Freq: Once | INTRAVENOUS | Status: AC | PRN
  Administered 2017-02-24: 100 mL via INTRAVENOUS

## 2017-02-24 MED ORDER — SODIUM CHLORIDE 0.9 % IJ SOLN
10.0000 mL | INTRAMUSCULAR | Status: DC | PRN
  Administered 2017-02-24: 10 mL via INTRAVENOUS
  Filled 2017-02-24: qty 10

## 2017-02-24 MED ORDER — IOPAMIDOL (ISOVUE-300) INJECTION 61%
INTRAVENOUS | Status: AC
  Filled 2017-02-24: qty 30

## 2017-02-24 MED ORDER — IOPAMIDOL (ISOVUE-300) INJECTION 61%
INTRAVENOUS | Status: AC
  Filled 2017-02-24: qty 100

## 2017-02-24 MED ORDER — HEPARIN SOD (PORK) LOCK FLUSH 100 UNIT/ML IV SOLN
500.0000 [IU] | Freq: Once | INTRAVENOUS | Status: AC
  Administered 2017-02-24: 500 [IU] via INTRAVENOUS

## 2017-02-24 MED ORDER — HEPARIN SOD (PORK) LOCK FLUSH 100 UNIT/ML IV SOLN
INTRAVENOUS | Status: AC
  Filled 2017-02-24: qty 5

## 2017-02-24 NOTE — Patient Instructions (Signed)
Implanted Port Home Guide An implanted port is a type of central line that is placed under the skin. Central lines are used to provide IV access when treatment or nutrition needs to be given through a person's veins. Implanted ports are used for long-term IV access. An implanted port may be placed because:  You need IV medicine that would be irritating to the small veins in your hands or arms.  You need long-term IV medicines, such as antibiotics.  You need IV nutrition for a long period.  You need frequent blood draws for lab tests.  You need dialysis.  Implanted ports are usually placed in the chest area, but they can also be placed in the upper arm, the abdomen, or the leg. An implanted port has two main parts:  Reservoir. The reservoir is round and will appear as a small, raised area under your skin. The reservoir is the part where a needle is inserted to give medicines or draw blood.  Catheter. The catheter is a thin, flexible tube that extends from the reservoir. The catheter is placed into a large vein. Medicine that is inserted into the reservoir goes into the catheter and then into the vein.  How will I care for my incision site? Do not get the incision site wet. Bathe or shower as directed by your health care provider. How is my port accessed? Special steps must be taken to access the port:  Before the port is accessed, a numbing cream can be placed on the skin. This helps numb the skin over the port site.  Your health care provider uses a sterile technique to access the port. ? Your health care provider must put on a mask and sterile gloves. ? The skin over your port is cleaned carefully with an antiseptic and allowed to dry. ? The port is gently pinched between sterile gloves, and a needle is inserted into the port.  Only "non-coring" port needles should be used to access the port. Once the port is accessed, a blood return should be checked. This helps ensure that the port  is in the vein and is not clogged.  If your port needs to remain accessed for a constant infusion, a clear (transparent) bandage will be placed over the needle site. The bandage and needle will need to be changed every week, or as directed by your health care provider.  Keep the bandage covering the needle clean and dry. Do not get it wet. Follow your health care provider's instructions on how to take a shower or bath while the port is accessed.  If your port does not need to stay accessed, no bandage is needed over the port.  What is flushing? Flushing helps keep the port from getting clogged. Follow your health care provider's instructions on how and when to flush the port. Ports are usually flushed with saline solution or a medicine called heparin. The need for flushing will depend on how the port is used.  If the port is used for intermittent medicines or blood draws, the port will need to be flushed: ? After medicines have been given. ? After blood has been drawn. ? As part of routine maintenance.  If a constant infusion is running, the port may not need to be flushed.  How long will my port stay implanted? The port can stay in for as long as your health care provider thinks it is needed. When it is time for the port to come out, surgery will be   done to remove it. The procedure is similar to the one performed when the port was put in. When should I seek immediate medical care? When you have an implanted port, you should seek immediate medical care if:  You notice a bad smell coming from the incision site.  You have swelling, redness, or drainage at the incision site.  You have more swelling or pain at the port site or the surrounding area.  You have a fever that is not controlled with medicine.  This information is not intended to replace advice given to you by your health care provider. Make sure you discuss any questions you have with your health care provider. Document  Released: 09/09/2005 Document Revised: 02/15/2016 Document Reviewed: 05/17/2013 Elsevier Interactive Patient Education  2017 [employer]vier [employer].  

## 2017-02-26 ENCOUNTER — Ambulatory Visit: Payer: Medicare Other

## 2017-02-26 ENCOUNTER — Other Ambulatory Visit: Payer: Self-pay | Admitting: *Deleted

## 2017-02-26 ENCOUNTER — Telehealth: Payer: Self-pay | Admitting: Oncology

## 2017-02-26 ENCOUNTER — Ambulatory Visit (HOSPITAL_BASED_OUTPATIENT_CLINIC_OR_DEPARTMENT_OTHER): Payer: Medicare Other | Admitting: Oncology

## 2017-02-26 ENCOUNTER — Other Ambulatory Visit (HOSPITAL_BASED_OUTPATIENT_CLINIC_OR_DEPARTMENT_OTHER): Payer: Medicare Other

## 2017-02-26 VITALS — BP 132/63 | HR 51 | Temp 98.3°F | Resp 18 | Ht 63.0 in | Wt 160.7 lb

## 2017-02-26 DIAGNOSIS — Z7901 Long term (current) use of anticoagulants: Secondary | ICD-10-CM | POA: Diagnosis not present

## 2017-02-26 DIAGNOSIS — C250 Malignant neoplasm of head of pancreas: Secondary | ICD-10-CM

## 2017-02-26 DIAGNOSIS — C541 Malignant neoplasm of endometrium: Secondary | ICD-10-CM

## 2017-02-26 DIAGNOSIS — G620 Drug-induced polyneuropathy: Secondary | ICD-10-CM | POA: Diagnosis not present

## 2017-02-26 DIAGNOSIS — I4891 Unspecified atrial fibrillation: Secondary | ICD-10-CM | POA: Diagnosis not present

## 2017-02-26 DIAGNOSIS — Z95828 Presence of other vascular implants and grafts: Secondary | ICD-10-CM

## 2017-02-26 DIAGNOSIS — Z452 Encounter for adjustment and management of vascular access device: Secondary | ICD-10-CM

## 2017-02-26 LAB — COMPREHENSIVE METABOLIC PANEL
ALT: 36 U/L (ref 0–55)
AST: 53 U/L — AB (ref 5–34)
Albumin: 3.6 g/dL (ref 3.5–5.0)
Alkaline Phosphatase: 144 U/L (ref 40–150)
Anion Gap: 7 mEq/L (ref 3–11)
BUN: 13 mg/dL (ref 7.0–26.0)
CHLORIDE: 108 meq/L (ref 98–109)
CO2: 24 meq/L (ref 22–29)
Calcium: 8.9 mg/dL (ref 8.4–10.4)
Creatinine: 0.7 mg/dL (ref 0.6–1.1)
GLUCOSE: 90 mg/dL (ref 70–140)
POTASSIUM: 4 meq/L (ref 3.5–5.1)
SODIUM: 139 meq/L (ref 136–145)
Total Bilirubin: 0.56 mg/dL (ref 0.20–1.20)
Total Protein: 6.8 g/dL (ref 6.4–8.3)

## 2017-02-26 LAB — CBC WITH DIFFERENTIAL/PLATELET
BASO%: 1.3 % (ref 0.0–2.0)
BASOS ABS: 0 10*3/uL (ref 0.0–0.1)
EOS ABS: 0.1 10*3/uL (ref 0.0–0.5)
EOS%: 3.3 % (ref 0.0–7.0)
HCT: 31.3 % — ABNORMAL LOW (ref 34.8–46.6)
HGB: 10.4 g/dL — ABNORMAL LOW (ref 11.6–15.9)
LYMPH%: 32.4 % (ref 14.0–49.7)
MCH: 30.2 pg (ref 25.1–34.0)
MCHC: 33.1 g/dL (ref 31.5–36.0)
MCV: 91.2 fL (ref 79.5–101.0)
MONO#: 0.4 10*3/uL (ref 0.1–0.9)
MONO%: 11.9 % (ref 0.0–14.0)
NEUT#: 1.6 10*3/uL (ref 1.5–6.5)
NEUT%: 51.1 % (ref 38.4–76.8)
Platelets: 190 10*3/uL (ref 145–400)
RBC: 3.43 10*6/uL — AB (ref 3.70–5.45)
RDW: 13.9 % (ref 11.2–14.5)
WBC: 3.2 10*3/uL — ABNORMAL LOW (ref 3.9–10.3)
lymph#: 1 10*3/uL (ref 0.9–3.3)

## 2017-02-26 MED ORDER — SODIUM CHLORIDE 0.9 % IJ SOLN
10.0000 mL | INTRAMUSCULAR | Status: DC | PRN
  Administered 2017-02-26: 10 mL via INTRAVENOUS
  Filled 2017-02-26: qty 10

## 2017-02-26 MED ORDER — HEPARIN SOD (PORK) LOCK FLUSH 100 UNIT/ML IV SOLN
500.0000 [IU] | Freq: Once | INTRAVENOUS | Status: AC
  Administered 2017-02-26: 500 [IU] via INTRAVENOUS
  Filled 2017-02-26: qty 5

## 2017-02-26 MED ORDER — ZOLPIDEM TARTRATE ER 6.25 MG PO TBCR: 6.2500 mg | EXTENDED_RELEASE_TABLET | Freq: Every day | ORAL | 0 refills | Status: AC

## 2017-02-26 MED ORDER — SODIUM CHLORIDE 0.9% FLUSH
10.0000 mL | INTRAVENOUS | Status: DC | PRN
  Administered 2017-02-26: 10 mL via INTRAVENOUS
  Filled 2017-02-26: qty 10

## 2017-02-26 NOTE — Progress Notes (Signed)
[facility] OFFICE PROGRESS NOTE   Diagnosis: Pancreas cancer  INTERVAL HISTORY:  [name] returns as scheduled. She has increased neuropathy symptoms in the hands and feet. She now has difficulty buttoning her shirt. Abdominal pain has improved. She continues to have back pain. She thinks the back pain is related to arthritis. Good appetite. No diarrhea.  Objective:  Vital signs in last 24 hours:  Blood pressure 132/63, pulse (!) 51, temperature 98.3 F (36.8 C), temperature source Oral, resp. rate 18, height 5\' 3"  (1.6 m), weight 160 lb 11.2 oz (72.9 kg), SpO2 96 %.    HEENT: No thrush or ulcers Resp: Lungs clear bilaterally Cardio: Regular rate and rhythm GI: No hepatosplenomegaly, no mass, nontender Vascular: No leg edema Neuro: Moderate to severe loss of vibratory sense at the fingertips bilaterally   Portacath/PICC-without erythema  Lab Results:  Lab Results  Component Value Date   WBC 3.2 (L) 02/26/2017   HGB 10.4 (L) 02/26/2017   HCT 31.3 (L) 02/26/2017   MCV 91.2 02/26/2017   PLT 190 02/26/2017   NEUTROABS 1.6 02/26/2017    CMP     Component Value Date/Time   NA 139 02/26/2017 0956   K 4.0 02/26/2017 0956   CL 104 01/27/2017 0532   CO2 24 02/26/2017 0956   GLUCOSE 90 02/26/2017 0956   BUN 13.0 02/26/2017 0956   CREATININE 0.7 02/26/2017 0956   CALCIUM 8.9 02/26/2017 0956   PROT 6.8 02/26/2017 0956   ALBUMIN 3.6 02/26/2017 0956   AST 53 (H) 02/26/2017 0956   ALT 36 02/26/2017 0956   ALKPHOS 144 02/26/2017 0956   BILITOT 0.56 02/26/2017 0956   GFRNONAA >60 01/27/2017 0532   GFRAA >60 01/27/2017 0532   02/12/2017-CA 19-9: 53   Lab Results  Component Value Date   INR 1.20 08/31/2014    Imaging:  Ct Abdomen Pelvis W Contrast  Result Date: 02/24/2017 CLINICAL DATA:  Pancreatic cancer in 2015 with recurrence in 2017. Chemotherapy in progress. Endometrial cancer in 2010 with recurrence in 2016. Radiation therapy complete. Whipple  procedure. Hysterectomy. EXAM: CT ABDOMEN AND PELVIS WITH CONTRAST TECHNIQUE: Multidetector CT imaging of the abdomen and pelvis was performed using the standard protocol following bolus administration of intravenous contrast. CONTRAST:  18mL ISOVUE-300 IOPAMIDOL (ISOVUE-300) INJECTION 61% COMPARISON:  11/28/2016.  Chest CT 01/25/2017. FINDINGS: Lower chest: Clear lung bases. Normal heart size without pericardial or pleural effusion. Hepatobiliary: Suspect mild hepatic steatosis with sparing in the posterior aspect of segment 4. cholecystectomy and choledochojejunostomy. No intrahepatic biliary duct dilatation. Pancreas: Fatty atrophy involving the pancreatic body and tail. Status post Whipple procedure. Soft tissue thickening within the adjacent root of the small bowel mesentery. This measures 2.8 x 1.9 cm on image 57/series 7. Minimally decreased from 2.9 x 2.2 cm on the prior. Remains intimately associated with the superior mesenteric artery and vein. Spleen: Normal in size, without focal abnormality. Adrenals/Urinary Tract: Normal adrenal glands. Normal kidneys, without hydronephrosis. Normal urinary bladder. Stomach/Bowel: Gastrojejunostomy. Normal colon, appendix, and terminal ileum. Otherwise normal small bowel. Vascular/Lymphatic: Aortic and branch vessel atherosclerosis. Portal caval node measures 14 mm on image 42/series 7 and is similar to the prior exam (when remeasured). Reproductive: Hysterectomy. The previously described soft tissue fullness about the left-sided vaginal cuff has resolved. No adnexal mass. Other: No significant free fluid. No evidence of omental or peritoneal disease. Musculoskeletal: Lumbosacral spondylosis with trace L4-5 anterolisthesis. Schmorl's node deformity versus mild compression deformity at superior endplate of L5. Similar. IMPRESSION: 1.  Status post Whipple procedure. Slight decrease in soft tissue thickening about the root of the small bowel mesentery. 2. Hysterectomy.  Resolution of previously described soft tissue fullness about the left side of the vaginal cuff. 3. Portocaval borderline to mild adenopathy is similar. 4.  Aortic atherosclerosis. Electronically Signed   By: Abigail Miyamoto M.D.   On: 02/24/2017 09:28    Medications: I have reviewed the patient's current medications.  Assessment/Plan: 1. 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  Gemcitabine held 11/02/2014 due to neutropenia.  Gemcitabine 11/09/2014 dose reduced 800 mg/m.  Gemcitabine held 11/16/2014 due to neutropenia.  Gemcitabine resumed 11/23/2014 every 2 week schedule.  Cycle 6 gemcitabine 01/05/2015  Cycle 7 gemcitabine 01/18/2015  Cycle 8 gemcitabine 02/01/2015  Cycle 9 gemcitabine 02/15/2015  Cycle 10 gemcitabine 03/01/2015  Cycle 11 gemcitabine 03/15/2015  Cycle 12 gemcitabine 03/29/2015  Elevated CA 19-01 May 2016  CTs 06/17/2016-new soft tissue mass at the root of the mesentery with vascular involvement  PET 06/27/2016-hypermetabolic activity associated with soft tissue adjacent to surgical clips in the central mesentery  Status post SBRTto the mesenteric mass completed 07/26/2016  CT abdomen/pelvis 08/23/2016 -mesenteric mass stable to slightly decreased in size.  Cycle 1 FOLFOX 09/03/2016  Cycle 2 FOLFOX 09/30/2016  Cycle 3 FOLFOX 10/14/2016   Cycle 4 FOLFOX 11/05/2016 (5-FU bolus eliminated and 5-FU pump dose reduced)  Cycle 5 FOLFOX 11/19/2016  CT abdomen/pelvis 11/28/2016-decreased size of soft tissue at the small bowel mesentery, mild asymmetric soft tissue at the left vaginal cuff  Cycle 6 FOLFOX 12/10/2016  (5-FU infusion further reduced and oxaliplatin reduced)  Cycle 7 FOLFOX 12/31/2016  Cycle 8 FOLFOX 01/21/2017   CT 02/24/2017-slight decrease in size of the mesenteric mass, resolution of soft tissue fullness at the left vaginal cuff, no evidence of disease progression   2. bile duct obstruction secondary to #1, status post an ERCP with stent placement 09/23/2015hypermetabolic soft tissue in the central mesentery, no other evidence of metastatic disease, stable mildly enlarged portal caval node  3. Admission with post ERCP pancreatitis 06/16/2014  4. History of abdominal pain secondary to #1  5. Pulmonary embolism diagnosed on a CT of the abdomen 09/16/2014  Negative lower extremity Dopplers 09/17/2014  6. Multiple orthopedic surgical procedures  7. Endometrial cancer,stage IA, grade 1 endometrioid adenocarcinoma, 18% myometrial invasion, no lymphovascular space involvement, negative washings  Status post robotic total hysterectomy and bilateral salpingo-oophorectomy 11/30/2010  Recurrent tumor left lateral vagina status post biopsy 11/24/2014 with pathology confirming adenocarcinoma with focal squamous differentiation consistent with endometrial adenocarcinoma  Staging CT scans 12/06/2014 with no evidence of local pancreatic cancer recurrence. Small fluid collection adjacent to the left adrenal gland. Severe hepatic steatosis. No evidence of local extension of endometrial carcinoma. Carcinoma not well-defined at the vaginal cuff. 5 mm right external iliac lymph node. 3.6 mm left external iliac lymph node  Brachytherapy initiated 12/22/2014, completed 01/19/2015  CT abdomen/pelvis 07/24/2015 revealed a 3 x 4 cm soft tissue focus at the vaginal apex  PET scan 08/11/2015 revealed no mass at the vaginal apex and no evidence of metastatic disease  CT 11/28/2016-mild asymmetric soft tissue at the left vaginal cuff, resolved on CT 02/24/2017  8. History of atrial  fibrillation-maintained on xarelto  9. Family history of multiple cancers-negative CancerNext gene panel  10. Prolonged nausea following the pancreaticoduodenectomy. Improved 10/26/2014.  11. Port-A-Cath placement 10/21/2014.  12. History of Neutropenia secondary to chemotherapy   13. Diarrhea. Question pancreatic insufficiency. Pancreatic enzyme replacement initiated 01/05/2015. Recurrent diarrhea following a course of antibiotics March 2017.  14. History of positional vertigo-resolved  15. Pain-abdominaland back pain-likely secondary to the mesenteric mass, the abdominal pain has improved  16. Neutropenia secondary to chemotherapy 09/18/2016.  17. Delayed nausea and diarrhea following FOLFOX. Emend added with cycle 2. Decadron prophylaxis added with cycle 3  18. Diarrhea 10/28/2016. Question related to chemotherapy. Negative C. difficile testing 10/31/2016.  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     Disposition:  Her overall performance status has improved. She has developed progressive oxaliplatin neuropathy. The restaging CT shows no evidence of tumor progression. The CA 19-9 remains mildly elevated. I discussed treatment options with [name] and her family. FOLFOX will be discontinued secondary to the oxaliplatin neuropathy. She will be followed with observation. We will consider salvage treatment options when there is clinical evidence of disease progression.  She will return for an office visit and Port-A-Cath flush in 6 weeks.  25 minutes were spent with the patient today. The majority of the time was used for counseling and coordination of care.  Donneta Romberg, MD  02/26/2017  10:50 AM

## 2017-02-26 NOTE — Telephone Encounter (Signed)
Gave patient AVS and calender - per 6/6 LOS

## 2017-02-27 ENCOUNTER — Ambulatory Visit: Payer: Medicare Other

## 2017-02-27 LAB — CANCER ANTIGEN 19-9: CA 19-9: 39 U/mL — ABNORMAL HIGH (ref 0–35)

## 2017-03-07 ENCOUNTER — Ambulatory Visit
Admission: RE | Admit: 2017-03-07 | Discharge: 2017-03-07 | Disposition: A | Discharge status: Home / Self Care | Payer: Medicare Other | Source: Ambulatory Visit | Attending: Family Medicine | Admitting: Family Medicine

## 2017-03-07 DIAGNOSIS — Z1231 Encounter for screening mammogram for malignant neoplasm of breast: Secondary | ICD-10-CM

## 2017-03-25 ENCOUNTER — Telehealth: Payer: Self-pay | Admitting: *Deleted

## 2017-03-25 ENCOUNTER — Telehealth: Payer: Self-pay

## 2017-03-25 NOTE — Telephone Encounter (Signed)
Called pt regarding a nose bleed she had this AM. Informed pt that MD was aware of nose bleed event. Informed pt that at this time it does not look like she needs to come over earlier than her 7/24. Informed pt to call back if she experiences more symptoms of bleeding. Pt verbalizes understanding and agrees with plan of care. Patient appreciative of call back.

## 2017-03-25 NOTE — Telephone Encounter (Signed)
Pt called stating she had nose bleed early this am along with headache.   Spoke with pt and was informed that pt experienced nose bleed for about 30 min. Before it stopped.   Bleeding was unprovoked.   Stated last night, pt took Tylenol 2 tabs yesterday for back pain, and again 2 tabs at night for sleep.   Pt wanted to know if Tylenol would cause nose bleed.   Nurse noted pt is also taking Xarelto 20 mg daily given by Dr. Aundra Dubin. Informed pt that bleeding more likely caused by Xarelto instead of Tylenol.   Pt stated her headache is resolved, and nose bleed had stopped.   Instructed pt to notify Dr. Claris Gladden office of nose bleed.   Message relayed to Dr. [name] and desk nurse. Pt's    Phone     [phone].

## 2017-04-08 ENCOUNTER — Other Ambulatory Visit: Payer: Self-pay | Admitting: Cardiology

## 2017-04-15 ENCOUNTER — Ambulatory Visit (HOSPITAL_BASED_OUTPATIENT_CLINIC_OR_DEPARTMENT_OTHER): Payer: Medicare Other | Admitting: Nurse Practitioner

## 2017-04-15 ENCOUNTER — Other Ambulatory Visit (HOSPITAL_BASED_OUTPATIENT_CLINIC_OR_DEPARTMENT_OTHER): Payer: Medicare Other

## 2017-04-15 ENCOUNTER — Telehealth: Payer: Self-pay | Admitting: Nurse Practitioner

## 2017-04-15 ENCOUNTER — Ambulatory Visit (HOSPITAL_BASED_OUTPATIENT_CLINIC_OR_DEPARTMENT_OTHER): Payer: Medicare Other

## 2017-04-15 VITALS — BP 106/59 | HR 51 | Temp 97.9°F | Resp 18 | Ht 63.0 in | Wt 161.5 lb

## 2017-04-15 DIAGNOSIS — Z452 Encounter for adjustment and management of vascular access device: Secondary | ICD-10-CM | POA: Diagnosis not present

## 2017-04-15 DIAGNOSIS — Z95828 Presence of other vascular implants and grafts: Secondary | ICD-10-CM

## 2017-04-15 DIAGNOSIS — G620 Drug-induced polyneuropathy: Secondary | ICD-10-CM | POA: Diagnosis not present

## 2017-04-15 DIAGNOSIS — Z8542 Personal history of malignant neoplasm of other parts of uterus: Secondary | ICD-10-CM | POA: Diagnosis not present

## 2017-04-15 DIAGNOSIS — C250 Malignant neoplasm of head of pancreas: Secondary | ICD-10-CM | POA: Diagnosis not present

## 2017-04-15 DIAGNOSIS — C541 Malignant neoplasm of endometrium: Secondary | ICD-10-CM

## 2017-04-15 LAB — BASIC METABOLIC PANEL
Anion Gap: 9 mEq/L (ref 3–11)
BUN: 12.2 mg/dL (ref 7.0–26.0)
CALCIUM: 8.9 mg/dL (ref 8.4–10.4)
CHLORIDE: 103 meq/L (ref 98–109)
CO2: 25 meq/L (ref 22–29)
CREATININE: 0.8 mg/dL (ref 0.6–1.1)
EGFR: 78 mL/min/{1.73_m2} — AB (ref >90)
GLUCOSE: 156 mg/dL — AB (ref 70–140)
Potassium: 4 mEq/L (ref 3.5–5.1)
Sodium: 137 mEq/L (ref 136–145)

## 2017-04-15 MED ORDER — HEPARIN SOD (PORK) LOCK FLUSH 100 UNIT/ML IV SOLN
500.0000 [IU] | Freq: Once | INTRAVENOUS | Status: AC | PRN
  Administered 2017-04-15: 500 [IU] via INTRAVENOUS
  Filled 2017-04-15: qty 5

## 2017-04-15 MED ORDER — SODIUM CHLORIDE 0.9 % IJ SOLN
10.0000 mL | INTRAMUSCULAR | Status: DC | PRN
  Administered 2017-04-15: 10 mL via INTRAVENOUS
  Filled 2017-04-15: qty 10

## 2017-04-15 NOTE — Patient Instructions (Signed)
Implanted Port Home Guide An implanted port is a type of central line that is placed under the skin. Central lines are used to provide IV access when treatment or nutrition needs to be given through a person's veins. Implanted ports are used for long-term IV access. An implanted port may be placed because:  You need IV medicine that would be irritating to the small veins in your hands or arms.  You need long-term IV medicines, such as antibiotics.  You need IV nutrition for a long period.  You need frequent blood draws for lab tests.  You need dialysis.  Implanted ports are usually placed in the chest area, but they can also be placed in the upper arm, the abdomen, or the leg. An implanted port has two main parts:  Reservoir. The reservoir is round and will appear as a small, raised area under your skin. The reservoir is the part where a needle is inserted to give medicines or draw blood.  Catheter. The catheter is a thin, flexible tube that extends from the reservoir. The catheter is placed into a large vein. Medicine that is inserted into the reservoir goes into the catheter and then into the vein.  How will I care for my incision site? Do not get the incision site wet. Bathe or shower as directed by your health care provider. How is my port accessed? Special steps must be taken to access the port:  Before the port is accessed, a numbing cream can be placed on the skin. This helps numb the skin over the port site.  Your health care provider uses a sterile technique to access the port. ? Your health care provider must put on a mask and sterile gloves. ? The skin over your port is cleaned carefully with an antiseptic and allowed to dry. ? The port is gently pinched between sterile gloves, and a needle is inserted into the port.  Only "non-coring" port needles should be used to access the port. Once the port is accessed, a blood return should be checked. This helps ensure that the port  is in the vein and is not clogged.  If your port needs to remain accessed for a constant infusion, a clear (transparent) bandage will be placed over the needle site. The bandage and needle will need to be changed every week, or as directed by your health care provider.  Keep the bandage covering the needle clean and dry. Do not get it wet. Follow your health care provider's instructions on how to take a shower or bath while the port is accessed.  If your port does not need to stay accessed, no bandage is needed over the port.  What is flushing? Flushing helps keep the port from getting clogged. Follow your health care provider's instructions on how and when to flush the port. Ports are usually flushed with saline solution or a medicine called heparin. The need for flushing will depend on how the port is used.  If the port is used for intermittent medicines or blood draws, the port will need to be flushed: ? After medicines have been given. ? After blood has been drawn. ? As part of routine maintenance.  If a constant infusion is running, the port may not need to be flushed.  How long will my port stay implanted? The port can stay in for as long as your health care provider thinks it is needed. When it is time for the port to come out, surgery will be   done to remove it. The procedure is similar to the one performed when the port was put in. When should I seek immediate medical care? When you have an implanted port, you should seek immediate medical care if:  You notice a bad smell coming from the incision site.  You have swelling, redness, or drainage at the incision site.  You have more swelling or pain at the port site or the surrounding area.  You have a fever that is not controlled with medicine.  This information is not intended to replace advice given to you by your health care provider. Make sure you discuss any questions you have with your health care provider. Document  Released: 09/09/2005 Document Revised: 02/15/2016 Document Reviewed: 05/17/2013 Elsevier Interactive Patient Education  2017 [employer]vier [employer].  

## 2017-04-15 NOTE — Progress Notes (Addendum)
[facility] OFFICE PROGRESS NOTE   Diagnosis: Pancreas cancer   INTERVAL HISTORY:   Ms. Tech returns as scheduled. She feels well. She has a good appetite. No abdominal pain. No nausea. No diarrhea. She notes increased neuropathy symptoms in the hands and feet with some associated pain. She began a trial of gabapentin last week. Thus far she has noted no improvement in the pain.  Objective:  Vital signs in last 24 hours:  Blood pressure (!) 106/59, pulse (!) 51, temperature 97.9 F (36.6 C), temperature source Oral, resp. rate 18, height 5\' 3"  (1.6 m), weight 161 lb 8 oz (73.3 kg), SpO2 97 %.    HEENT: No thrush or ulcers. Lymphatics: No palpable cervical, supraclavicular or axillary lymph nodes. Resp: Lungs clear bilaterally. Cardio: Regular rate and rhythm. GI: Abdomen soft and nontender. No hepatosplenomegaly. No mass. No apparent ascites. Vascular: No leg edema. Calves soft and nontender. Port-A-Cath without erythema.   Lab Results:  Lab Results  Component Value Date   WBC 3.2 (L) 02/26/2017   HGB 10.4 (L) 02/26/2017   HCT 31.3 (L) 02/26/2017   MCV 91.2 02/26/2017   PLT 190 02/26/2017   NEUTROABS 1.6 02/26/2017    Imaging:  No results found.  Medications: I have reviewed the patient's current medications.  Assessment/Plan: 1. 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  Gemcitabine held 11/02/2014 due to neutropenia.  Gemcitabine 11/09/2014 dose reduced 800 mg/m.  Gemcitabine held 11/16/2014 due to neutropenia.  Gemcitabine resumed 11/23/2014 every 2 week schedule.  Cycle 6 gemcitabine  01/05/2015  Cycle 7 gemcitabine 01/18/2015  Cycle 8 gemcitabine 02/01/2015  Cycle 9 gemcitabine 02/15/2015  Cycle 10 gemcitabine 03/01/2015  Cycle 11 gemcitabine 03/15/2015  Cycle 12 gemcitabine 03/29/2015  Elevated CA 19-01 May 2016  CTs 06/17/2016-new soft tissue mass at the root of the mesentery with vascular involvement  PET 06/27/2016-hypermetabolic activity associated with soft tissue adjacent to surgical clips in the central mesentery  Status post SBRTto the mesenteric mass completed 07/26/2016  CT abdomen/pelvis 08/23/2016 -mesenteric mass stable to slightly decreased in size.  Cycle 1 FOLFOX 09/03/2016  Cycle 2 FOLFOX 09/30/2016  Cycle 3 FOLFOX 10/14/2016   Cycle 4 FOLFOX 11/05/2016 (5-FU bolus eliminated and 5-FU pump dose reduced)  Cycle 5 FOLFOX 11/19/2016  CT abdomen/pelvis 11/28/2016-decreased size of soft tissue at the small bowel mesentery, mild asymmetric soft tissue at the left vaginal cuff  Cycle 6 FOLFOX 12/10/2016 (5-FU infusion further reduced and oxaliplatin reduced)  Cycle 7 FOLFOX 12/31/2016  Cycle 8 FOLFOX 01/21/2017   CT 02/24/2017-slight decrease in size of the mesenteric mass, resolution of soft tissue fullness at the left vaginal cuff, no evidence of disease progression   2. bile duct obstruction secondary to #1, status post an ERCP with stent placement 09/23/2015hypermetabolic soft tissue in the central mesentery, no other evidence of metastatic disease, stable mildly enlarged portal caval node  3. Admission with post ERCP pancreatitis 06/16/2014  4. History of abdominal pain secondary to #1  5. Pulmonary embolism diagnosed on a CT of the abdomen 09/16/2014  Negative lower extremity Dopplers 09/17/2014  6. Multiple orthopedic surgical procedures  7. Endometrial cancer,stage IA, grade 1 endometrioid adenocarcinoma, 18% myometrial invasion, no lymphovascular space involvement, negative washings  Status post  robotic total hysterectomy and bilateral salpingo-oophorectomy 11/30/2010  Recurrent tumor left lateral vagina status post biopsy 11/24/2014 with pathology confirming adenocarcinoma with focal squamous differentiation consistent with endometrial adenocarcinoma  Staging CT scans 12/06/2014 with no evidence of local pancreatic cancer recurrence. Small fluid collection adjacent to the left adrenal gland. Severe hepatic steatosis. No evidence of local extension of endometrial carcinoma. Carcinoma not well-defined at the vaginal cuff. 5 mm right external iliac lymph node. 3.6 mm left external iliac lymph node  Brachytherapy initiated 12/22/2014, completed 01/19/2015  CT abdomen/pelvis 07/24/2015 revealed a 3 x 4 cm soft tissue focus at the vaginal apex  PET scan 08/11/2015 revealed no mass at the vaginal apex and no evidence of metastatic disease  CT 11/28/2016-mild asymmetric soft tissue at the left vaginal cuff, resolved on CT 02/24/2017  8. History of atrial fibrillation-maintained on xarelto  9. Family history of multiple cancers-negative CancerNext gene panel  10. Prolonged nausea following the pancreaticoduodenectomy. Improved 10/26/2014.  11. Port-A-Cath placement 10/21/2014.  12. History of Neutropenia secondary to chemotherapy   13. Diarrhea. Question pancreatic insufficiency. Pancreatic enzyme replacement initiated 01/05/2015. Recurrent diarrhea following a course of antibiotics March 2017.  14. History of positional vertigo-resolved  15. Pain-abdominaland back pain-likely secondary to the mesenteric mass, the abdominal pain has improved  16. Neutropenia secondary to chemotherapy 09/18/2016.  17. Delayed nausea and diarrhea following FOLFOX. Emend added with cycle 2. Decadron prophylaxis added with cycle 3  18. Diarrhea 10/28/2016. Question related to chemotherapy. Negative C. difficile testing 10/31/2016.  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  Recently started a trial of Neurontin.    Disposition: Ms. Hedstrom appears stable. There is no clinical evidence of disease progression of the pancreas cancer. We will follow-up on the CA-19-9 from today.   She has persistent/progressive painful neuropathy symptoms. She recently began a trial of Neurontin. If she notes no improvement with the Neurontin we can consider a trial of Cymbalta.  She will return for labs, Port-A-Cath flush and a follow-up visit in 6-7 weeks. She will contact the office in the interim with any problems.   Patient seen with Dr. [name].   Ned Card ANP/GNP-BC   04/15/2017  10:24 AM  This was a shared visit with Ned Card. Ms. Galiano appears well. The plan is to continue observation for the metastatic pancreas cancer. We will follow-up on the CA 19-9 from today. We discussed management of the oxaliplatin related neuropathy.  Julieanne Manson, M.D.

## 2017-04-15 NOTE — Telephone Encounter (Signed)
Scheduled appt per 7/24 los - Gave patient AVS and calender per los.  

## 2017-04-16 LAB — CANCER ANTIGEN 19-9: CA 19-9: 29 U/mL (ref 0–35)

## 2017-05-10 ENCOUNTER — Other Ambulatory Visit: Payer: Self-pay | Admitting: Cardiology

## 2017-05-12 NOTE — Telephone Encounter (Signed)
FOLLOWED AT [facility]

## 2017-06-03 ENCOUNTER — Other Ambulatory Visit: Payer: Self-pay

## 2017-06-03 ENCOUNTER — Ambulatory Visit (HOSPITAL_BASED_OUTPATIENT_CLINIC_OR_DEPARTMENT_OTHER): Payer: Medicare Other | Admitting: Oncology

## 2017-06-03 ENCOUNTER — Telehealth: Payer: Self-pay | Admitting: Oncology

## 2017-06-03 ENCOUNTER — Ambulatory Visit (HOSPITAL_BASED_OUTPATIENT_CLINIC_OR_DEPARTMENT_OTHER): Payer: Medicare Other

## 2017-06-03 ENCOUNTER — Ambulatory Visit: Payer: Self-pay

## 2017-06-03 ENCOUNTER — Other Ambulatory Visit (HOSPITAL_BASED_OUTPATIENT_CLINIC_OR_DEPARTMENT_OTHER): Payer: Medicare Other

## 2017-06-03 VITALS — BP 112/79 | HR 47 | Temp 97.7°F | Resp 18 | Ht 63.0 in | Wt 160.2 lb

## 2017-06-03 DIAGNOSIS — Z95828 Presence of other vascular implants and grafts: Secondary | ICD-10-CM

## 2017-06-03 DIAGNOSIS — C250 Malignant neoplasm of head of pancreas: Secondary | ICD-10-CM

## 2017-06-03 DIAGNOSIS — C541 Malignant neoplasm of endometrium: Secondary | ICD-10-CM

## 2017-06-03 DIAGNOSIS — Z452 Encounter for adjustment and management of vascular access device: Secondary | ICD-10-CM

## 2017-06-03 LAB — COMPREHENSIVE METABOLIC PANEL
ALT: 38 U/L (ref 0–55)
ANION GAP: 8 meq/L (ref 3–11)
AST: 55 U/L — ABNORMAL HIGH (ref 5–34)
Albumin: 3.6 g/dL (ref 3.5–5.0)
Alkaline Phosphatase: 142 U/L (ref 40–150)
BUN: 13 mg/dL (ref 7.0–26.0)
CALCIUM: 9.2 mg/dL (ref 8.4–10.4)
CHLORIDE: 106 meq/L (ref 98–109)
CO2: 24 meq/L (ref 22–29)
CREATININE: 0.8 mg/dL (ref 0.6–1.1)
EGFR: 78 mL/min/{1.73_m2} — AB (ref >90)
Glucose: 111 mg/dl (ref 70–140)
POTASSIUM: 4 meq/L (ref 3.5–5.1)
Sodium: 138 mEq/L (ref 136–145)
Total Bilirubin: 0.56 mg/dL (ref 0.20–1.20)
Total Protein: 6.9 g/dL (ref 6.4–8.3)

## 2017-06-03 LAB — CBC WITH DIFFERENTIAL/PLATELET
BASO%: 1 % (ref 0.0–2.0)
BASOS ABS: 0 10*3/uL (ref 0.0–0.1)
EOS%: 3.7 % (ref 0.0–7.0)
Eosinophils Absolute: 0.1 10*3/uL (ref 0.0–0.5)
HEMATOCRIT: 34.6 % — AB (ref 34.8–46.6)
HGB: 11.6 g/dL (ref 11.6–15.9)
LYMPH#: 1.4 10*3/uL (ref 0.9–3.3)
LYMPH%: 38.3 % (ref 14.0–49.7)
MCH: 29.3 pg (ref 25.1–34.0)
MCHC: 33.5 g/dL (ref 31.5–36.0)
MCV: 87.4 fL (ref 79.5–101.0)
MONO#: 0.3 10*3/uL (ref 0.1–0.9)
MONO%: 8 % (ref 0.0–14.0)
NEUT#: 1.8 10*3/uL (ref 1.5–6.5)
NEUT%: 49 % (ref 38.4–76.8)
PLATELETS: 164 10*3/uL (ref 145–400)
RBC: 3.96 10*6/uL (ref 3.70–5.45)
RDW: 13.6 % (ref 11.2–14.5)
WBC: 3.7 10*3/uL — ABNORMAL LOW (ref 3.9–10.3)

## 2017-06-03 MED ORDER — SODIUM CHLORIDE 0.9 % IJ SOLN
10.0000 mL | INTRAMUSCULAR | Status: DC | PRN
  Administered 2017-06-03: 10 mL via INTRAVENOUS
  Filled 2017-06-03: qty 10

## 2017-06-03 MED ORDER — HYDROCODONE-ACETAMINOPHEN 5-325 MG PO TABS: 1.0000 | ORAL_TABLET | ORAL | 0 refills | Status: DC | PRN

## 2017-06-03 MED ORDER — PROMETHAZINE HCL 12.5 MG PO TABS: 12.5000 mg | ORAL_TABLET | Freq: Four times a day (QID) | ORAL | 1 refills | Status: DC | PRN

## 2017-06-03 MED ORDER — HEPARIN SOD (PORK) LOCK FLUSH 100 UNIT/ML IV SOLN
500.0000 [IU] | Freq: Once | INTRAVENOUS | Status: AC | PRN
  Administered 2017-06-03: 500 [IU] via INTRAVENOUS
  Filled 2017-06-03: qty 5

## 2017-06-03 MED ORDER — HYDROCODONE-ACETAMINOPHEN 5-325 MG PO TABS
ORAL_TABLET | ORAL | Status: AC
  Filled 2017-06-03: qty 1

## 2017-06-03 MED ORDER — HYDROCODONE-ACETAMINOPHEN 5-325 MG PO TABS
1.0000 | ORAL_TABLET | Freq: Once | ORAL | Status: AC
  Administered 2017-06-03: 1 via ORAL

## 2017-06-03 NOTE — Progress Notes (Signed)
[facility] OFFICE PROGRESS NOTE   Diagnosis: Pancreas cancer  INTERVAL HISTORY:   [name] returns as scheduled. She complains of increased abdomen and back pain for the past 2 weeks. She ran out of hydrocodone. She continues to have neuropathy symptoms in the hands and feet. Gabapentin has not helped.  Objective:  Vital signs in last 24 hours:  Blood pressure 112/79, pulse (!) 47, temperature 97.7 F (36.5 C), temperature source Oral, resp. rate 18, height 5\' 3"  (1.6 m), weight 160 lb 3.2 oz (72.7 kg), SpO2 (!) 10 %.    Resp: Lungs clear bilaterally Cardio: Regular rate and rhythm GI: No mass, no hepatosplenomegaly, tender in the left greater than right upper abdomen Vascular: No leg edema  Musculoskeletal: Tender to percussion at the left and mid lower back   Portacath/PICC-without erythema   Lab Results: CA 19-9 on 04/15/2017: 29  Medications: I have reviewed the patient's current medications.  Assessment/Plan: 1. 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  Gemcitabine held 11/02/2014 due to neutropenia.  Gemcitabine 11/09/2014 dose reduced 800 mg/m.  Gemcitabine held 11/16/2014 due to neutropenia.  Gemcitabine resumed 11/23/2014 every 2 week schedule.  Cycle 6 gemcitabine 01/05/2015  Cycle 7 gemcitabine 01/18/2015  Cycle 8 gemcitabine 02/01/2015  Cycle 9 gemcitabine 02/15/2015  Cycle 10 gemcitabine 03/01/2015  Cycle 11 gemcitabine 03/15/2015  Cycle 12 gemcitabine 03/29/2015  Elevated CA 19-01 May 2016  CTs 06/17/2016-new soft tissue mass at the root of the mesentery with vascular involvement  PET  06/27/2016-hypermetabolic activity associated with soft tissue adjacent to surgical clips in the central mesentery  Status post SBRTto the mesenteric mass completed 07/26/2016  CT abdomen/pelvis 08/23/2016 -mesenteric mass stable to slightly decreased in size.  Cycle 1 FOLFOX 09/03/2016  Cycle 2 FOLFOX 09/30/2016  Cycle 3 FOLFOX 10/14/2016   Cycle 4 FOLFOX 11/05/2016 (5-FU bolus eliminated and 5-FU pump dose reduced)  Cycle 5 FOLFOX 11/19/2016  CT abdomen/pelvis 11/28/2016-decreased size of soft tissue at the small bowel mesentery, mild asymmetric soft tissue at the left vaginal cuff  Cycle 6 FOLFOX 12/10/2016 (5-FU infusion further reduced and oxaliplatin reduced)  Cycle 7 FOLFOX 12/31/2016  Cycle 8 FOLFOX 01/21/2017   CT 02/24/2017-slight decrease in size of the mesenteric mass, resolution of soft tissue fullness at the left vaginal cuff, no evidence of disease progression   2. bile duct obstruction secondary to #1, status post an ERCP with stent placement 09/23/2015hypermetabolic soft tissue in the central mesentery, no other evidence of metastatic disease, stable mildly enlarged portal caval node  3. Admission with post ERCP pancreatitis 06/16/2014  4. History of abdominal pain secondary to #1  5. Pulmonary embolism diagnosed on a CT of the abdomen 09/16/2014  Negative lower extremity Dopplers 09/17/2014  6. Multiple orthopedic surgical procedures  7. Endometrial cancer,stage IA, grade 1 endometrioid adenocarcinoma, 18% myometrial invasion, no lymphovascular space involvement, negative washings  Status post robotic total hysterectomy and bilateral salpingo-oophorectomy 11/30/2010  Recurrent tumor left lateral vagina status post biopsy 11/24/2014 with pathology confirming adenocarcinoma with focal squamous differentiation consistent with endometrial adenocarcinoma  Staging CT scans 12/06/2014 with no evidence of local pancreatic cancer recurrence.  Small fluid collection adjacent to the left adrenal gland. Severe hepatic steatosis. No evidence of local  extension of endometrial carcinoma. Carcinoma not well-defined at the vaginal cuff. 5 mm right external iliac lymph node. 3.6 mm left external iliac lymph node  Brachytherapy initiated 12/22/2014, completed 01/19/2015  CT abdomen/pelvis 07/24/2015 revealed a 3 x 4 cm soft tissue focus at the vaginal apex  PET scan 08/11/2015 revealed no mass at the vaginal apex and no evidence of metastatic disease  CT 11/28/2016-mild asymmetric soft tissue at the left vaginal cuff, resolved on CT 02/24/2017  8. History of atrial fibrillation-maintained on xarelto  9. Family history of multiple cancers-negative CancerNext gene panel  10. Prolonged nausea following the pancreaticoduodenectomy. Improved 10/26/2014.  11. Port-A-Cath placement 10/21/2014.  12. History of Neutropenia secondary to chemotherapy   13. Diarrhea. Question pancreatic insufficiency. Pancreatic enzyme replacement initiated 01/05/2015. Recurrent diarrhea following a course of antibiotics March 2017.  14. History of positional vertigo-resolved  15. Pain-abdominaland back pain-likely secondary to the mesenteric mass  16. Neutropenia secondary to chemotherapy 09/18/2016.  17. Delayed nausea and diarrhea following FOLFOX. Emend added with cycle 2. Decadron prophylaxis added with cycle 3  18. Diarrhea 10/28/2016. Question related to chemotherapy. Negative C. difficile testing 10/31/2016.  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   Disposition:  [name] has a history of local recurrent cancer. She has increased pain. I am concerned the pain is related to progression of the pancreas cancer. We refilled a prescription for hydrocodone. She will contact us if this does not help the pain. She will be scheduled for a restaging CT evaluation and office visit in approximately 4 weeks.  She will discontinue  gabapentin.  15 minutes were spent with the patient today. The majority of the time was used for counseling and coordination of care.  Donneta Romberg, MD  06/03/2017  10:14 AM

## 2017-06-03 NOTE — Telephone Encounter (Signed)
Gave patient avs report and appointments for October. [facility] will call re scan.

## 2017-06-04 LAB — CANCER ANTIGEN 19-9: CAN 19-9: 30 U/mL (ref 0–35)

## 2017-06-16 ENCOUNTER — Encounter: Payer: Self-pay | Admitting: Oncology

## 2017-06-17 NOTE — Telephone Encounter (Signed)
Called pt, she reports pain is up to 10/10 before pain med. 5/10 after taking 2 Hydrocodone. She has had worsening pain since 9/23. Reports persistent nausea and "burping a lot". Antiemetic helps. Denies constipation. Pt is requesting to have CT sooner to determine cause of pain. Reviewed with Dr. [name]: OK to have scan this week.  Called radiology, scan scheduled for 9/27 @ 3PM. NPO 4 hours prior. Drink contrast at 1PM and 2 PM. Called pt with above instructions, she voiced understanding.

## 2017-06-19 ENCOUNTER — Ambulatory Visit (HOSPITAL_COMMUNITY)
Admission: RE | Admit: 2017-06-19 | Discharge: 2017-06-19 | Disposition: A | Discharge status: Home / Self Care | Payer: Medicare Other | Source: Ambulatory Visit | Attending: Oncology | Admitting: Oncology

## 2017-06-19 DIAGNOSIS — R591 Generalized enlarged lymph nodes: Secondary | ICD-10-CM | POA: Diagnosis not present

## 2017-06-19 DIAGNOSIS — R938 Abnormal findings on diagnostic imaging of other specified body structures: Secondary | ICD-10-CM | POA: Diagnosis not present

## 2017-06-19 DIAGNOSIS — Z9889 Other specified postprocedural states: Secondary | ICD-10-CM | POA: Insufficient documentation

## 2017-06-19 DIAGNOSIS — M799 Soft tissue disorder, unspecified: Secondary | ICD-10-CM | POA: Diagnosis not present

## 2017-06-19 DIAGNOSIS — C250 Malignant neoplasm of head of pancreas: Secondary | ICD-10-CM | POA: Diagnosis present

## 2017-06-19 MED ORDER — IOPAMIDOL (ISOVUE-300) INJECTION 61%
100.0000 mL | Freq: Once | INTRAVENOUS | Status: AC | PRN
  Administered 2017-06-19: 100 mL via INTRAVENOUS

## 2017-06-19 MED ORDER — IOPAMIDOL (ISOVUE-300) INJECTION 61%
INTRAVENOUS | Status: AC
  Filled 2017-06-19: qty 100

## 2017-06-19 MED ORDER — HEPARIN SOD (PORK) LOCK FLUSH 100 UNIT/ML IV SOLN
500.0000 [IU] | Freq: Once | INTRAVENOUS | Status: AC
  Administered 2017-06-19: 500 [IU] via INTRAVENOUS

## 2017-06-19 MED ORDER — HEPARIN SOD (PORK) LOCK FLUSH 100 UNIT/ML IV SOLN
INTRAVENOUS | Status: AC
  Filled 2017-06-19: qty 5

## 2017-06-20 ENCOUNTER — Encounter: Payer: Self-pay | Admitting: Oncology

## 2017-06-20 ENCOUNTER — Telehealth: Payer: Self-pay

## 2017-06-20 NOTE — Telephone Encounter (Signed)
-----   Message from Ladell Pier, MD sent at 06/20/2017 10:43 AM EDT ----- Please call patient, CT shows no evidence of progressive pancreas cancer. Slight increase in thickening at the vaginal cuff likely represents radiation change   Follow-up as scheduled

## 2017-06-20 NOTE — Telephone Encounter (Signed)
Call placed to patient per MD [name] regarding the results of her CT. Pt verbalizes understanding and is appreciative of call back.

## 2017-06-25 ENCOUNTER — Telehealth: Payer: Self-pay

## 2017-06-25 NOTE — Telephone Encounter (Signed)
Called and spoke with patient concerning upcoming appointment 10/4 @1 :30pm . Per 10/3 sch message

## 2017-06-26 ENCOUNTER — Ambulatory Visit (HOSPITAL_BASED_OUTPATIENT_CLINIC_OR_DEPARTMENT_OTHER): Payer: Medicare Other | Admitting: Oncology

## 2017-06-26 ENCOUNTER — Ambulatory Visit (HOSPITAL_BASED_OUTPATIENT_CLINIC_OR_DEPARTMENT_OTHER): Payer: Medicare Other

## 2017-06-26 ENCOUNTER — Encounter: Payer: Self-pay | Admitting: Oncology

## 2017-06-26 ENCOUNTER — Telehealth: Payer: Self-pay | Admitting: Oncology

## 2017-06-26 VITALS — BP 133/62 | HR 55 | Temp 98.0°F | Resp 18 | Ht 63.0 in | Wt 165.0 lb

## 2017-06-26 DIAGNOSIS — C250 Malignant neoplasm of head of pancreas: Secondary | ICD-10-CM

## 2017-06-26 DIAGNOSIS — D701 Agranulocytosis secondary to cancer chemotherapy: Secondary | ICD-10-CM

## 2017-06-26 DIAGNOSIS — I4891 Unspecified atrial fibrillation: Secondary | ICD-10-CM

## 2017-06-26 DIAGNOSIS — C541 Malignant neoplasm of endometrium: Secondary | ICD-10-CM | POA: Diagnosis not present

## 2017-06-26 DIAGNOSIS — R1012 Left upper quadrant pain: Secondary | ICD-10-CM

## 2017-06-26 DIAGNOSIS — G620 Drug-induced polyneuropathy: Secondary | ICD-10-CM | POA: Diagnosis not present

## 2017-06-26 MED ORDER — HYDROMORPHONE HCL 4 MG PO TABS: 4.0000 mg | ORAL_TABLET | ORAL | 0 refills | Status: DC | PRN

## 2017-06-26 NOTE — Telephone Encounter (Signed)
No additional appts scheduled per 10/4 los.
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
## 2017-06-27 DIAGNOSIS — R1012 Left upper quadrant pain: Secondary | ICD-10-CM | POA: Insufficient documentation

## 2017-06-27 LAB — LIPASE: Lipase: 5 U/L — ABNORMAL LOW (ref 14–72)

## 2017-06-27 LAB — AMYLASE: Amylase, Serum: 52 U/L (ref 31–124)

## 2017-06-27 LAB — CANCER ANTIGEN 19-9: CA 19-9: 24 U/mL (ref 0–35)

## 2017-06-27 NOTE — Assessment & Plan Note (Addendum)
1. 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  Gemcitabine held 11/02/2014 due to neutropenia.  Gemcitabine 11/09/2014 dose reduced 800 mg/m.  Gemcitabine held 11/16/2014 due to neutropenia.  Gemcitabine resumed 11/23/2014 every 2 week schedule.  Cycle 6 gemcitabine 01/05/2015  Cycle 7 gemcitabine 01/18/2015  Cycle 8 gemcitabine 02/01/2015  Cycle 9 gemcitabine 02/15/2015  Cycle 10 gemcitabine 03/01/2015  Cycle 11 gemcitabine 03/15/2015  Cycle 12 gemcitabine 03/29/2015  Elevated CA 19-01 May 2016  CTs 06/17/2016-new soft tissue mass at the root of the mesentery with vascular involvement  PET 06/27/2016-hypermetabolic activity associated with soft tissue adjacent to surgical clips in the central mesentery  Status post SBRTto the mesenteric mass completed 07/26/2016  CT abdomen/pelvis 08/23/2016 -mesenteric mass stable to slightly decreased in size.  Cycle 1 FOLFOX 09/03/2016  Cycle 2 FOLFOX 09/30/2016  Cycle 3 FOLFOX 10/14/2016   Cycle 4 FOLFOX 11/05/2016 (5-FU bolus eliminated and 5-FU pump dose reduced)  Cycle 5 FOLFOX 11/19/2016  CT abdomen/pelvis 11/28/2016-decreased size of soft tissue at the small bowel mesentery, mild asymmetric soft tissue at the left vaginal cuff  Cycle 6 FOLFOX 12/10/2016 (5-FU infusion further reduced and oxaliplatin reduced)  Cycle 7 FOLFOX 12/31/2016  Cycle 8 FOLFOX 01/21/2017   CT 02/24/2017-slight decrease in size of the mesenteric mass, resolution of soft tissue fullness at the left vaginal cuff, no evidence of disease progression   2. bile duct obstruction secondary to #1, status post an  ERCP with stent placement 09/23/2015hypermetabolic soft tissue in the central mesentery, no other evidence of metastatic disease, stable mildly enlarged portal caval node  3. Admission with post ERCP pancreatitis 06/16/2014  4. History of abdominal pain secondary to #1  5. Pulmonary embolism diagnosed on a CT of the abdomen 09/16/2014  Negative lower extremity Dopplers 09/17/2014  6. Multiple orthopedic surgical procedures  7. Endometrial cancer,stage IA, grade 1 endometrioid adenocarcinoma, 18% myometrial invasion, no lymphovascular space involvement, negative washings  Status post robotic total hysterectomy and bilateral salpingo-oophorectomy 11/30/2010  Recurrent tumor left lateral vagina status post biopsy 11/24/2014 with pathology confirming adenocarcinoma with focal squamous differentiation consistent with endometrial adenocarcinoma  Staging CT scans 12/06/2014 with no evidence of local pancreatic cancer recurrence. Small fluid collection adjacent to the left adrenal gland. Severe hepatic steatosis. No evidence of local extension of endometrial carcinoma. Carcinoma not well-defined at the vaginal cuff. 5 mm right external iliac lymph node. 3.6 mm left external iliac lymph node  Brachytherapy initiated 12/22/2014, completed 01/19/2015  CT abdomen/pelvis 07/24/2015 revealed a 3 x 4 cm soft tissue focus at the vaginal apex  PET scan 08/11/2015 revealed no mass at the vaginal apex and no evidence of metastatic disease  CT 11/28/2016-mild asymmetric soft tissue at the left vaginal cuff, resolved on CT 02/24/2017 8. History of atrial fibrillation-maintained on xarelto  9. Family history of multiple cancers-negative CancerNext gene panel  10. Prolonged nausea following the pancreaticoduodenectomy. Improved 10/26/2014.  11. Port-A-Cath placement 10/21/2014.  12. History of Neutropenia secondary to chemotherapy   13. Diarrhea. Question pancreatic insufficiency.  Pancreatic enzyme replacement initiated 01/05/2015. Recurrent diarrhea following a course of antibiotics March 2017.  14. History of positional vertigo-resolved  15. Pain-abdominaland back pain-likely secondary to the mesenteric mass  16. Neutropenia secondary to chemotherapy 09/18/2016.  17. Delayed nausea and diarrhea following FOLFOX. Emend added with cycle 2. Decadron prophylaxis added with cycle 3  18. Diarrhea 10/28/2016. Question related to chemotherapy. Negative C. difficile testing 10/31/2016.  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   Disposition:  [name] has a history of local recurrent cancer. She has increasing pain in her abdomen and left flank. Recent CT scan results and images were reviewed. We do not see an obvious cause for her abdominal pain. Will obtain additional lab studies today including a CA 19.9, amylase, and lipase. Dr. [name] plans to review the recent CT scan results with radiology. The patient was given a prescription for hydromorphone 4 mg tablets. The patient was instructed to begin with one half tablet to see how she tolerates this. She may increase to one full tablet if her pain is not controlled with a half a tablet. Patient has a follow-up appointment scheduled next week with Dr. [name] which she will keep. We may need to consider additional imaging studies such as a PET scan pending the review of the recent CT with radiology. If the patient's pain worsens and is not controlled with hydromorphone, we instructed her to go to the emergency room for further evaluation and workup.  The patient was seen and examined with Dr. [name].

## 2017-06-30 ENCOUNTER — Ambulatory Visit (HOSPITAL_COMMUNITY): Payer: Medicare Other

## 2017-07-01 ENCOUNTER — Encounter (HOSPITAL_COMMUNITY): Payer: Self-pay | Admitting: *Deleted

## 2017-07-01 ENCOUNTER — Other Ambulatory Visit: Payer: Self-pay

## 2017-07-01 ENCOUNTER — Telehealth: Payer: Self-pay

## 2017-07-01 ENCOUNTER — Telehealth: Payer: Self-pay | Admitting: Oncology

## 2017-07-01 ENCOUNTER — Ambulatory Visit (HOSPITAL_BASED_OUTPATIENT_CLINIC_OR_DEPARTMENT_OTHER): Payer: Medicare Other | Admitting: Oncology

## 2017-07-01 VITALS — BP 144/60 | HR 49 | Temp 97.8°F | Resp 17 | Ht 63.0 in | Wt 162.1 lb

## 2017-07-01 DIAGNOSIS — M549 Dorsalgia, unspecified: Secondary | ICD-10-CM

## 2017-07-01 DIAGNOSIS — C250 Malignant neoplasm of head of pancreas: Secondary | ICD-10-CM | POA: Diagnosis not present

## 2017-07-01 DIAGNOSIS — R109 Unspecified abdominal pain: Secondary | ICD-10-CM | POA: Diagnosis not present

## 2017-07-01 DIAGNOSIS — C259 Malignant neoplasm of pancreas, unspecified: Secondary | ICD-10-CM

## 2017-07-01 DIAGNOSIS — Z23 Encounter for immunization: Secondary | ICD-10-CM

## 2017-07-01 MED ORDER — INFLUENZA VAC SPLIT HIGH-DOSE 0.5 ML IM SUSY
0.5000 mL | PREFILLED_SYRINGE | Freq: Once | INTRAMUSCULAR | Status: AC
  Administered 2017-07-01: 0.5 mL via INTRAMUSCULAR
  Filled 2017-07-01: qty 0.5

## 2017-07-01 MED ORDER — HYDROMORPHONE HCL 4 MG PO TABS: 4.0000 mg | ORAL_TABLET | ORAL | 0 refills | Status: DC | PRN

## 2017-07-01 NOTE — Progress Notes (Signed)
[facility] OFFICE PROGRESS NOTE   Diagnosis: Pancreas cancer  INTERVAL HISTORY:   She continues to have pain at the left upper abdomen and left back. She continues to take 2 hydrocodone tablets every 4 hours. She has not tried Dilaudid.  Objective:  Vital signs in last 24 hours:  Blood pressure (!) 144/60, pulse (!) 49, temperature 97.8 F (36.6 C), temperature source Oral, resp. rate 17, height 5\' 3"  (1.6 m), weight 162 lb 1.6 oz (73.5 kg), SpO2 97 %.    Resp: Lungs clear bilaterally Cardio: Regular rate and rhythm GI: No hepatosplenomegaly, slight fullness in the left mid upper abdomen with associated tenderness Vascular: No leg edema Musculoskeletal: Tender at the left mid-paraspinous region without a discrete mass   Portacath/PICC-without erythema  Lab Results:  Lab Results  Component Value Date   WBC 3.7 (L) 06/03/2017   HGB 11.6 06/03/2017   HCT 34.6 (L) 06/03/2017   MCV 87.4 06/03/2017   PLT 164 06/03/2017   NEUTROABS 1.8 06/03/2017    CMP     Component Value Date/Time   NA 138 06/03/2017 0937   K 4.0 06/03/2017 0937   CL 104 01/27/2017 0532   CO2 24 06/03/2017 0937   GLUCOSE 111 06/03/2017 0937   BUN 13.0 06/03/2017 0937   CREATININE 0.8 06/03/2017 0937   CALCIUM 9.2 06/03/2017 0937   PROT 6.9 06/03/2017 0937   ALBUMIN 3.6 06/03/2017 0937   AST 55 (H) 06/03/2017 0937   ALT 38 06/03/2017 0937   ALKPHOS 142 06/03/2017 0937   BILITOT 0.56 06/03/2017 0937   GFRNONAA >60 01/27/2017 0532   GFRAA >60 01/27/2017 0532  06/26/2017: CA 19-9-24  Medications: I have reviewed the patient's current medications.  Assessment/Plan: 1. 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  Gemcitabine held 11/02/2014 due to neutropenia.  Gemcitabine 11/09/2014 dose reduced 800 mg/m.  Gemcitabine held 11/16/2014 due to neutropenia.  Gemcitabine resumed 11/23/2014 every 2 week schedule.  Cycle 6 gemcitabine 01/05/2015  Cycle 7 gemcitabine 01/18/2015  Cycle 8 gemcitabine 02/01/2015  Cycle 9 gemcitabine 02/15/2015  Cycle 10 gemcitabine 03/01/2015  Cycle 11 gemcitabine 03/15/2015  Cycle 12 gemcitabine 03/29/2015  Elevated CA 19-01 May 2016  CTs 06/17/2016-new soft tissue mass at the root of the mesentery with vascular involvement  PET 06/27/2016-hypermetabolic activity associated with soft tissue adjacent to surgical clips in the central mesentery  Status post SBRTto the mesenteric mass completed 07/26/2016  CT abdomen/pelvis 08/23/2016 -mesenteric mass stable to slightly decreased in size.  Cycle 1 FOLFOX 09/03/2016  Cycle 2 FOLFOX 09/30/2016  Cycle 3 FOLFOX 10/14/2016   Cycle 4 FOLFOX 11/05/2016 (5-FU bolus eliminated and 5-FU pump dose reduced)  Cycle 5 FOLFOX 11/19/2016  CT abdomen/pelvis 11/28/2016-decreased size of soft tissue at the small bowel mesentery, mild asymmetric soft tissue at the left vaginal cuff  Cycle 6 FOLFOX 12/10/2016 (5-FU infusion further reduced and oxaliplatin reduced)  Cycle 7 FOLFOX 12/31/2016  Cycle 8 FOLFOX 01/21/2017   CT 02/24/2017-slight decrease in size of the mesenteric mass, resolution of soft tissue fullness at the left vaginal cuff, no evidence of disease progression  CT abdomen/pelvis 06/20/2017-stable ill-defined soft tissue at the mesenteric root, mild increased asymmetry at the left vaginal cuff, no other evidence  of disease progression   2. bile duct obstruction secondary to #1, status post an ERCP with stent placement 09/23/2015hypermetabolic soft tissue in the central mesentery, no other evidence of metastatic disease,  stable mildly enlarged portal caval node  3. Admission with post ERCP pancreatitis 06/16/2014  4. History of abdominal pain secondary to #1  5. Pulmonary embolism diagnosed on a CT of the abdomen 09/16/2014  Negative lower extremity Dopplers 09/17/2014  6. Multiple orthopedic surgical procedures  7. Endometrial cancer,stage IA, grade 1 endometrioid adenocarcinoma, 18% myometrial invasion, no lymphovascular space involvement, negative washings  Status post robotic total hysterectomy and bilateral salpingo-oophorectomy 11/30/2010  Recurrent tumor left lateral vagina status post biopsy 11/24/2014 with pathology confirming adenocarcinoma with focal squamous differentiation consistent with endometrial adenocarcinoma  Staging CT scans 12/06/2014 with no evidence of local pancreatic cancer recurrence. Small fluid collection adjacent to the left adrenal gland. Severe hepatic steatosis. No evidence of local extension of endometrial carcinoma. Carcinoma not well-defined at the vaginal cuff. 5 mm right external iliac lymph node. 3.6 mm left external iliac lymph node  Brachytherapy initiated 12/22/2014, completed 01/19/2015  CT abdomen/pelvis 07/24/2015 revealed a 3 x 4 cm soft tissue focus at the vaginal apex  PET scan 08/11/2015 revealed no mass at the vaginal apex and no evidence of metastatic disease  CT 11/28/2016-mild asymmetric soft tissue at the left vaginal cuff, resolved on CT 02/24/2017  8. History of atrial fibrillation-maintained on xarelto  9. Family history of multiple cancers-negative CancerNext gene panel  10. Prolonged nausea following the pancreaticoduodenectomy. Improved 10/26/2014.  11. Port-A-Cath placement 10/21/2014.  12. History of Neutropenia secondary to chemotherapy   13. Diarrhea. Question pancreatic insufficiency. Pancreatic enzyme replacement initiated 01/05/2015. Recurrent diarrhea following a course of antibiotics March 2017.  14.  History of positional vertigo-resolved  15. Pain-abdominaland back pain-likely secondary to the mesenteric mass  16. Neutropenia secondary to chemotherapy 09/18/2016.  17. Delayed nausea and diarrhea following FOLFOX. Emend added with cycle 2. Decadron prophylaxis added with cycle 3  18. Diarrhea 10/28/2016. Question related to chemotherapy. Negative C. difficile testing 10/31/2016.  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   Disposition:  [name] continues to have pain at the upper abdomen and left back. She has not tried hydromorphone. I encouraged her to use hydromorphone for pain not relieved with hydrocodone.  I reviewed the CT images from 06/20/2017 in radiology. There is no evidence of disease progression on the CT and no explanation for the pain. I suspect the pain is related to recurrent tumor in the resection bed/mesenteric root.  I discussed the case with Dr. [name]. He will see Ms. Briones to consider a celiac block. We can consider salvage systemic therapy, but at present there is no easily measured disease.  She will return for an office visit in approximately 3 weeks.  25 minutes were spent with the patient today. The majority of the time was used for counseling and coordination of care. She received an influenza vaccine today.  Donneta Romberg, MD  07/01/2017  11:49 AM

## 2017-07-01 NOTE — Telephone Encounter (Signed)
Scheduled appt per 10/9 los - Gave patient AVS and calender per los.  

## 2017-07-01 NOTE — Telephone Encounter (Signed)
Celiac plexus block scheduled, pt instructed and medications reviewed.   Patient to call with any questions or concerns.

## 2017-07-01 NOTE — Addendum Note (Signed)
Addended by: Rosalio Macadamia C on: 07/01/2017 12:42 PM   Modules accepted: Orders

## 2017-07-01 NOTE — Telephone Encounter (Signed)
-----   Message from Milus Banister, MD sent at 07/01/2017 12:51 PM EDT ----- Sande Brothers get in touch with her today.   Nawal Burling, She needs upper EUS this Thursday with MAC sedation for celiac plexus neurolysis (linear scope).  Dx: pancreatic cancer pain.  Thanks   ----- Message ----- From: Ladell Pier, MD Sent: 07/01/2017  12:28 PM To: Milus Banister, MD  This is the patient with abdominal pain and a history of locally recurrent pancreas cancer  She is on Xarelto for a pulmonary embolism diagnosed in 2015 I asked her to hold the Xarelto  Thanks for seeing her  Leroy Sea

## 2017-07-01 NOTE — Telephone Encounter (Signed)
Pt scheduled for 07/03/17 130 pm plexus block with [name].  Pt needs to be called and instructed.

## 2017-07-03 ENCOUNTER — Ambulatory Visit (HOSPITAL_COMMUNITY): Payer: Medicare Other | Admitting: Anesthesiology

## 2017-07-03 ENCOUNTER — Encounter (HOSPITAL_COMMUNITY): Payer: Self-pay | Admitting: Emergency Medicine

## 2017-07-03 ENCOUNTER — Ambulatory Visit (HOSPITAL_COMMUNITY)
Admission: RE | Admit: 2017-07-03 | Discharge: 2017-07-03 | Disposition: A | Discharge status: Home / Self Care | Payer: Medicare Other | Source: Ambulatory Visit | Attending: Gastroenterology | Admitting: Gastroenterology

## 2017-07-03 ENCOUNTER — Encounter (HOSPITAL_COMMUNITY)
Admission: RE | Disposition: A | Discharge status: Home / Self Care | Payer: Self-pay | Source: Ambulatory Visit | Attending: Gastroenterology

## 2017-07-03 DIAGNOSIS — I4891 Unspecified atrial fibrillation: Secondary | ICD-10-CM | POA: Insufficient documentation

## 2017-07-03 DIAGNOSIS — Z8542 Personal history of malignant neoplasm of other parts of uterus: Secondary | ICD-10-CM | POA: Insufficient documentation

## 2017-07-03 DIAGNOSIS — C259 Malignant neoplasm of pancreas, unspecified: Secondary | ICD-10-CM | POA: Insufficient documentation

## 2017-07-03 DIAGNOSIS — Z7901 Long term (current) use of anticoagulants: Secondary | ICD-10-CM | POA: Diagnosis not present

## 2017-07-03 DIAGNOSIS — I10 Essential (primary) hypertension: Secondary | ICD-10-CM | POA: Diagnosis not present

## 2017-07-03 DIAGNOSIS — Z86711 Personal history of pulmonary embolism: Secondary | ICD-10-CM | POA: Diagnosis not present

## 2017-07-03 HISTORY — PX: NEUROLYTIC CELIAC PLEXUS: SHX5435

## 2017-07-03 HISTORY — DX: Personal history of antineoplastic chemotherapy: Z92.21

## 2017-07-03 HISTORY — DX: Polyneuropathy, unspecified: G62.9

## 2017-07-03 HISTORY — PX: ESOPHAGOGASTRODUODENOSCOPY: SHX5428

## 2017-07-03 HISTORY — DX: Unspecified atrial fibrillation: I48.91

## 2017-07-03 SURGERY — EGD (ESOPHAGOGASTRODUODENOSCOPY)
Anesthesia: Monitor Anesthesia Care

## 2017-07-03 MED ORDER — SODIUM CHLORIDE 0.9 % IV SOLN: INTRAVENOUS | Status: DC

## 2017-07-03 MED ORDER — LIDOCAINE 2% (20 MG/ML) 5 ML SYRINGE
INTRAMUSCULAR | Status: AC
  Filled 2017-07-03: qty 5

## 2017-07-03 MED ORDER — SODIUM CHLORIDE 0.9 % IJ SOLN
INTRAMUSCULAR | Status: AC
  Filled 2017-07-03: qty 20

## 2017-07-03 MED ORDER — ALCOHOL 98 % IJ SOLN
10.0000 mL | Freq: Once | INTRAMUSCULAR | Status: AC
  Administered 2017-07-03: 10 mL via INTRAVENOUS
  Filled 2017-07-03: qty 10

## 2017-07-03 MED ORDER — PROPOFOL 10 MG/ML IV BOLUS
INTRAVENOUS | Status: DC | PRN
Start: 2017-07-03 — End: 2017-07-03
  Administered 2017-07-03 (×2): 20 mg via INTRAVENOUS

## 2017-07-03 MED ORDER — FENTANYL CITRATE (PF) 100 MCG/2ML IJ SOLN
INTRAMUSCULAR | Status: DC | PRN
Start: 2017-07-03 — End: 2017-07-03
  Administered 2017-07-03: 50 ug via INTRAVENOUS

## 2017-07-03 MED ORDER — LACTATED RINGERS IV SOLN
INTRAVENOUS | Status: DC
  Administered 2017-07-03: 13:00:00 via INTRAVENOUS

## 2017-07-03 MED ORDER — HEPARIN SOD (PORK) LOCK FLUSH 100 UNIT/ML IV SOLN
INTRAVENOUS | Status: AC
  Filled 2017-07-03: qty 5

## 2017-07-03 MED ORDER — LIDOCAINE 2% (20 MG/ML) 5 ML SYRINGE
INTRAMUSCULAR | Status: DC | PRN
  Administered 2017-07-03: 100 mg via INTRAVENOUS

## 2017-07-03 MED ORDER — HEPARIN SOD (PORK) LOCK FLUSH 10 UNIT/ML IV SOLN
10.0000 [IU] | Freq: Once | INTRAVENOUS | Status: AC
  Administered 2017-07-03: 10 [IU]
  Filled 2017-07-03: qty 1

## 2017-07-03 MED ORDER — BUPIVACAINE HCL (PF) 0.25 % IJ SOLN
10.0000 mL | Freq: Once | INTRAMUSCULAR | Status: AC
  Administered 2017-07-03: 10 mL
  Filled 2017-07-03 (×2): qty 30

## 2017-07-03 MED ORDER — PROPOFOL 500 MG/50ML IV EMUL
INTRAVENOUS | Status: DC | PRN
  Administered 2017-07-03: 140 ug/kg/min via INTRAVENOUS

## 2017-07-03 MED ORDER — PROPOFOL 10 MG/ML IV BOLUS
INTRAVENOUS | Status: AC
  Filled 2017-07-03: qty 40

## 2017-07-03 MED ORDER — SODIUM CHLORIDE 0.9 % IJ SOLN
20.0000 mL | Freq: Once | INTRAMUSCULAR | Status: AC
  Administered 2017-07-03: 15 mL via INTRAVENOUS

## 2017-07-03 MED ORDER — FENTANYL CITRATE (PF) 100 MCG/2ML IJ SOLN
INTRAMUSCULAR | Status: AC
  Filled 2017-07-03: qty 2

## 2017-07-03 NOTE — Transfer of Care (Signed)
Immediate Anesthesia Transfer of Care Note  Patient: [name]  Procedure(s) Performed: ESOPHAGOGASTRODUODENOSCOPY (EGD) (N/A ) NEUROLYTIC CELIAC PLEXUS (N/A )  Patient Location: Endoscopy Unit  Anesthesia Type:MAC  Level of Consciousness: awake and alert   Airway & Oxygen Therapy: Patient Spontanous Breathing and Patient connected to nasal cannula oxygen  Post-op Assessment: Report given to RN and Post -op Vital signs reviewed and stable  Post vital signs: Reviewed and stable  Last Vitals:  Vitals:   07/03/17 1216  BP: (!) 147/68  Pulse: (!) 47  Resp: (!) 21  Temp: 36.6 C  SpO2: 94%    Last Pain:  Vitals:   07/03/17 1216  TempSrc: Oral         Complications: No apparent anesthesia complications

## 2017-07-03 NOTE — Anesthesia Postprocedure Evaluation (Signed)
Anesthesia Post Note  Patient: [name]  Procedure(s) Performed: ESOPHAGOGASTRODUODENOSCOPY (EGD) (N/A ) NEUROLYTIC CELIAC PLEXUS (N/A )     Patient location during evaluation: PACU Anesthesia Type: MAC Level of consciousness: awake and alert Pain management: pain level controlled Vital Signs Assessment: post-procedure vital signs reviewed and stable Respiratory status: spontaneous breathing, nonlabored ventilation, respiratory function stable and patient connected to nasal cannula oxygen Cardiovascular status: stable and blood pressure returned to baseline Postop Assessment: no apparent nausea or vomiting Anesthetic complications: no    Last Vitals:  Vitals:   07/03/17 1340 07/03/17 1350  BP: (!) 148/82 (!) 170/91  Pulse: (!) 52 (!) 52  Resp: (!) 29 18  Temp: 36.6 C   SpO2: 97% 98%    Last Pain:  Vitals:   07/03/17 1340  TempSrc: Oral  PainSc: 2                  Ryan P Ellender

## 2017-07-03 NOTE — Discharge Instructions (Signed)

## 2017-07-03 NOTE — Interval H&P Note (Signed)
History and Physical Interval Note:  07/03/2017 12:59 PM  [name]  has presented today for surgery, with the diagnosis of pancreatic cancer pain  The various methods of treatment have been discussed with the patient and family. After consideration of risks, benefits and other options for treatment, the patient has consented to  Procedure(s): ESOPHAGOGASTRODUODENOSCOPY (EGD) (N/A) NEUROLYTIC CELIAC PLEXUS (N/A) as a surgical intervention .  The patient's history has been reviewed, patient examined, no change in status, stable for surgery.  I have reviewed the patient's chart and labs.  Questions were answered to the patient's satisfaction.     Milus Banister

## 2017-07-03 NOTE — Anesthesia Preprocedure Evaluation (Addendum)
Anesthesia Evaluation  Patient identified by MRN, date of birth, ID band Patient awake    Reviewed: Allergy & Precautions, H&P , NPO status , Patient's Chart, lab work & pertinent test results  History of Anesthesia Complications Negative for: history of anesthetic complications  Airway Mallampati: II  TM Distance: >3 FB Neck ROM: Full    Dental  (+) Upper Dentures   Pulmonary neg pulmonary ROS,    Pulmonary exam normal breath sounds clear to auscultation       Cardiovascular hypertension, Pt. on medications and Pt. on home beta blockers (-) angina(-) Past MI and (-) CHF Normal cardiovascular exam+ dysrhythmias Atrial Fibrillation  Rhythm:Regular  ECG: SR, rate 17  Sees cardiologist   Neuro/Psych negative psych ROS   GI/Hepatic Chronic gastritis   Endo/Other  Pancreatic Cancer  Renal/GU negative Renal ROS     Musculoskeletal  (+) Arthritis ,   Abdominal   Peds  Hematology  (+) anemia ,   Anesthesia Other Findings pancreatic cancer pain  Reproductive/Obstetrics                            Anesthesia Physical  Anesthesia Plan  ASA: III  Anesthesia Plan: MAC   Post-op Pain Management:    Induction: Intravenous  PONV Risk Score and Plan: 2 and Propofol infusion and Treatment may vary due to age or medical condition  Airway Management Planned: Natural Airway  Additional Equipment:   Intra-op Plan:   Post-operative Plan:   Informed Consent: I have reviewed the patients History and Physical, chart, labs and discussed the procedure including the risks, benefits and alternatives for the proposed anesthesia with the patient or authorized representative who has indicated his/her understanding and acceptance.   Dental advisory given  Plan Discussed with: CRNA  Anesthesia Plan Comments:         Anesthesia Quick Evaluation

## 2017-07-03 NOTE — Op Note (Signed)
[facility] Patient Name: [name] Procedure Date: 07/03/2017 MRN: [id number] Attending MD: Milus Banister , MD Date of Birth: 10/07/1945 CSN: [id number] Age: 71 Admit Type: Inpatient Procedure:                Upper EUS with celiac plexus neurolysis Indications:              pancreatic adenocarcinoma, s/p 2015 Whipple T2N0,                            adjuvant chemo; presumed recurrence at PET avid                            mesenteric mass; further chemo/XRT; no with                            gradually worsening abdominal pains Providers:                Milus Banister, MD, Elmer Ramp. Tilden Dome, RN,                            William Dalton, Technician Referring MD:             Julieanne Manson, MD Medicines:                Monitored Anesthesia Care Complications:            No immediate complications. Estimated blood loss:                            None. Estimated Blood Loss:     Estimated blood loss: none. Procedure:                Pre-Anesthesia Assessment:                           - Prior to the procedure, a History and Physical                            was performed, and patient medications and                            allergies were reviewed. The patient's tolerance of                            previous anesthesia was also reviewed. The risks                            and benefits of the procedure and the sedation                            options and risks were discussed with the patient.                            All questions were answered, and informed consent  was obtained. Prior Anticoagulants: The patient has                            taken no previous anticoagulant or antiplatelet                            agents. ASA Grade Assessment: II - A patient with                            mild systemic disease. After reviewing the risks                            and benefits, the patient was deemed in                             satisfactory condition to undergo the procedure.                           After obtaining informed consent, the endoscope was                            passed under direct vision. Throughout the                            procedure, the patient's blood pressure, pulse, and                            oxygen saturations were monitored continuously. The                            was introduced through the mouth, and advanced to                            the body of the stomach. The upper GI endoscopy was                            accomplished without difficulty. The patient                            tolerated the procedure well. Scope In: Scope Out: Findings:      1. Typical UGI anatomy s/p 2015 Whipple surgery      2. Using linear echoendoscope, the celiac trunk takeoff from the       abominal aortal was well visualized. Just proximal to the takeoff I       performed the usual protocol for celiac plexus neurolysis using 20 gauge       dedicated CPN needle; flushing with sterile saline, drawback to confirm       lack of vascular access, followed by 68mL injection of 0.25% bupivicane,       followed by 47mL injection of 98% alcohol. I injected sterile saline       while removing the needle. Impression:               - Typical post Whipple anatomy.                           -  Celiac plexus neurolysis in usual fashion. Moderate Sedation:      N/A- Per Anesthesia Care Recommendation:           - Patient has a contact number available for                            emergencies. The signs and symptoms of potential                            delayed complications were discussed with the                            patient. Return to normal activities tomorrow.                            Written discharge instructions were provided to the                            patient.                           - Resume previous diet.                           - Continue present  medications. Procedure Code(s):        --- Professional ---                           [phone], 52, Esophagogastroduodenoscopy, flexible,                            transoral; with directed submucosal injection(s),                            any substance Diagnosis Code(s):        --- Professional ---                           R10.12, Left upper quadrant pain CPT copyright 2016 [employer]. All rights reserved. The codes documented in this report are preliminary and upon coder review may  be revised to meet current compliance requirements. Milus Banister, MD 07/03/2017 1:57:05 PM This report has been signed electronically. Number of Addenda: 0

## 2017-07-03 NOTE — H&P (View-Only) (Signed)
[place] OFFICE PROGRESS NOTE   Diagnosis: Pancreas cancer  INTERVAL HISTORY:   She continues to have pain at the left upper abdomen and left back. She continues to take 2 hydrocodone tablets every 4 hours. She has not tried Dilaudid.  Objective:  Vital signs in last 24 hours:  Blood pressure (!) 144/60, pulse (!) 49, temperature 97.8 F (36.6 C), temperature source Oral, resp. rate 17, height 5\' 3"  (1.6 m), weight 162 lb 1.6 oz (73.5 kg), SpO2 97 %.    Resp: Lungs clear bilaterally Cardio: Regular rate and rhythm GI: No hepatosplenomegaly, slight fullness in the left mid upper abdomen with associated tenderness Vascular: No leg edema Musculoskeletal: Tender at the left mid-paraspinous region without a discrete mass   Portacath/PICC-without erythema  Lab Results:  Lab Results  Component Value Date   WBC 3.7 (L) 06/03/2017   HGB 11.6 06/03/2017   HCT 34.6 (L) 06/03/2017   MCV 87.4 06/03/2017   PLT 164 06/03/2017   NEUTROABS 1.8 06/03/2017    CMP     Component Value Date/Time   NA 138 06/03/2017 0937   K 4.0 06/03/2017 0937   CL 104 01/27/2017 0532   CO2 24 06/03/2017 0937   GLUCOSE 111 06/03/2017 0937   BUN 13.0 06/03/2017 0937   CREATININE 0.8 06/03/2017 0937   CALCIUM 9.2 06/03/2017 0937   PROT 6.9 06/03/2017 0937   ALBUMIN 3.6 06/03/2017 0937   AST 55 (H) 06/03/2017 0937   ALT 38 06/03/2017 0937   ALKPHOS 142 06/03/2017 0937   BILITOT 0.56 06/03/2017 0937   GFRNONAA >60 01/27/2017 0532   GFRAA >60 01/27/2017 0532  06/26/2017: CA 19-9-24  Medications: I have reviewed the patient's current medications.  Assessment/Plan: 1. 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  Gemcitabine held 11/02/2014 due to neutropenia.  Gemcitabine 11/09/2014 dose reduced 800 mg/m.  Gemcitabine held 11/16/2014 due to neutropenia.  Gemcitabine resumed 11/23/2014 every 2 week schedule.  Cycle 6 gemcitabine 01/05/2015  Cycle 7 gemcitabine 01/18/2015  Cycle 8 gemcitabine 02/01/2015  Cycle 9 gemcitabine 02/15/2015  Cycle 10 gemcitabine 03/01/2015  Cycle 11 gemcitabine 03/15/2015  Cycle 12 gemcitabine 03/29/2015  Elevated CA 19-01 May 2016  CTs 06/17/2016-new soft tissue mass at the root of the mesentery with vascular involvement  PET 06/27/2016-hypermetabolic activity associated with soft tissue adjacent to surgical clips in the central mesentery  Status post SBRTto the mesenteric mass completed 07/26/2016  CT abdomen/pelvis 08/23/2016 -mesenteric mass stable to slightly decreased in size.  Cycle 1 FOLFOX 09/03/2016  Cycle 2 FOLFOX 09/30/2016  Cycle 3 FOLFOX 10/14/2016   Cycle 4 FOLFOX 11/05/2016 (5-FU bolus eliminated and 5-FU pump dose reduced)  Cycle 5 FOLFOX 11/19/2016  CT abdomen/pelvis 11/28/2016-decreased size of soft tissue at the small bowel mesentery, mild asymmetric soft tissue at the left vaginal cuff  Cycle 6 FOLFOX 12/10/2016 (5-FU infusion further reduced and oxaliplatin reduced)  Cycle 7 FOLFOX 12/31/2016  Cycle 8 FOLFOX 01/21/2017   CT 02/24/2017-slight decrease in size of the mesenteric mass, resolution of soft tissue fullness at the left vaginal cuff, no evidence of disease progression  CT abdomen/pelvis 06/20/2017-stable ill-defined soft tissue at the mesenteric root, mild increased asymmetry at the left vaginal cuff, no other evidence  of disease progression   2. bile duct obstruction secondary to #1, status post an ERCP with stent placement 09/23/2015hypermetabolic soft tissue in the central mesentery, no other evidence of metastatic disease,  stable mildly enlarged portal caval node  3. Admission with post ERCP pancreatitis 06/16/2014  4. History of abdominal pain secondary to #1  5. Pulmonary embolism diagnosed on a CT of the abdomen 09/16/2014  Negative lower extremity Dopplers 09/17/2014  6. Multiple orthopedic surgical procedures  7. Endometrial cancer,stage IA, grade 1 endometrioid adenocarcinoma, 18% myometrial invasion, no lymphovascular space involvement, negative washings  Status post robotic total hysterectomy and bilateral salpingo-oophorectomy 11/30/2010  Recurrent tumor left lateral vagina status post biopsy 11/24/2014 with pathology confirming adenocarcinoma with focal squamous differentiation consistent with endometrial adenocarcinoma  Staging CT scans 12/06/2014 with no evidence of local pancreatic cancer recurrence. Small fluid collection adjacent to the left adrenal gland. Severe hepatic steatosis. No evidence of local extension of endometrial carcinoma. Carcinoma not well-defined at the vaginal cuff. 5 mm right external iliac lymph node. 3.6 mm left external iliac lymph node  Brachytherapy initiated 12/22/2014, completed 01/19/2015  CT abdomen/pelvis 07/24/2015 revealed a 3 x 4 cm soft tissue focus at the vaginal apex  PET scan 08/11/2015 revealed no mass at the vaginal apex and no evidence of metastatic disease  CT 11/28/2016-mild asymmetric soft tissue at the left vaginal cuff, resolved on CT 02/24/2017  8. History of atrial fibrillation-maintained on xarelto  9. Family history of multiple cancers-negative CancerNext gene panel  10. Prolonged nausea following the pancreaticoduodenectomy. Improved 10/26/2014.  11. Port-A-Cath placement 10/21/2014.  12. History of Neutropenia secondary to chemotherapy   13. Diarrhea. Question pancreatic insufficiency. Pancreatic enzyme replacement initiated 01/05/2015. Recurrent diarrhea following a course of antibiotics March 2017.  14.  History of positional vertigo-resolved  15. Pain-abdominaland back pain-likely secondary to the mesenteric mass  16. Neutropenia secondary to chemotherapy 09/18/2016.  17. Delayed nausea and diarrhea following FOLFOX. Emend added with cycle 2. Decadron prophylaxis added with cycle 3  18. Diarrhea 10/28/2016. Question related to chemotherapy. Negative C. difficile testing 10/31/2016.  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   Disposition:  Ms. Raynor continues to have pain at the upper abdomen and left back. She has not tried hydromorphone. I encouraged her to use hydromorphone for pain not relieved with hydrocodone.  I reviewed the CT images from 06/20/2017 in radiology. There is no evidence of disease progression on the CT and no explanation for the pain. I suspect the pain is related to recurrent tumor in the resection bed/mesenteric root.  I discussed the case with Dr. [name]. He will see Ms. Shedden to consider a celiac block. We can consider salvage systemic therapy, but at present there is no easily measured disease.  She will return for an office visit in approximately 3 weeks.  25 minutes were spent with the patient today. The majority of the time was used for counseling and coordination of care. She received an influenza vaccine today.  Donneta Romberg, MD  07/01/2017  11:49 AM

## 2017-07-04 ENCOUNTER — Encounter (HOSPITAL_COMMUNITY): Payer: Self-pay | Admitting: Gastroenterology

## 2017-07-07 ENCOUNTER — Encounter: Payer: Self-pay | Admitting: Oncology

## 2017-07-07 ENCOUNTER — Telehealth: Payer: Self-pay | Admitting: *Deleted

## 2017-07-07 ENCOUNTER — Other Ambulatory Visit (INDEPENDENT_AMBULATORY_CARE_PROVIDER_SITE_OTHER): Payer: Medicare Other

## 2017-07-07 ENCOUNTER — Telehealth: Payer: Self-pay | Admitting: Gastroenterology

## 2017-07-07 DIAGNOSIS — C259 Malignant neoplasm of pancreas, unspecified: Secondary | ICD-10-CM

## 2017-07-07 DIAGNOSIS — C250 Malignant neoplasm of head of pancreas: Secondary | ICD-10-CM

## 2017-07-07 LAB — CBC WITH DIFFERENTIAL/PLATELET
Basophils Absolute: 0 10*3/uL (ref 0.0–0.1)
Basophils Relative: 0.7 % (ref 0.0–3.0)
EOS PCT: 2.8 % (ref 0.0–5.0)
Eosinophils Absolute: 0.1 10*3/uL (ref 0.0–0.7)
HCT: 34.2 % — ABNORMAL LOW (ref 36.0–46.0)
Hemoglobin: 11.8 g/dL — ABNORMAL LOW (ref 12.0–15.0)
LYMPHS ABS: 1.5 10*3/uL (ref 0.7–4.0)
Lymphocytes Relative: 38.4 % (ref 12.0–46.0)
MCHC: 34.4 g/dL (ref 30.0–36.0)
MCV: 87.9 fl (ref 78.0–100.0)
MONO ABS: 0.4 10*3/uL (ref 0.1–1.0)
MONOS PCT: 10.3 % (ref 3.0–12.0)
NEUTROS ABS: 1.8 10*3/uL (ref 1.4–7.7)
Neutrophils Relative %: 47.8 % (ref 43.0–77.0)
Platelets: 191 10*3/uL (ref 150.0–400.0)
RBC: 3.89 Mil/uL (ref 3.87–5.11)
RDW: 14.4 % (ref 11.5–15.5)
WBC: 3.8 10*3/uL — ABNORMAL LOW (ref 4.0–10.5)

## 2017-07-07 LAB — COMPREHENSIVE METABOLIC PANEL
ALBUMIN: 3.9 g/dL (ref 3.5–5.2)
ALT: 21 U/L (ref 0–35)
AST: 40 U/L — AB (ref 0–37)
Alkaline Phosphatase: 122 U/L — ABNORMAL HIGH (ref 39–117)
BILIRUBIN TOTAL: 0.7 mg/dL (ref 0.2–1.2)
BUN: 10 mg/dL (ref 6–23)
CALCIUM: 9 mg/dL (ref 8.4–10.5)
CO2: 28 mEq/L (ref 19–32)
CREATININE: 0.66 mg/dL (ref 0.40–1.20)
Chloride: 102 mEq/L (ref 96–112)
GFR: 94.03 mL/min (ref >60.00)
Glucose, Bld: 102 mg/dL — ABNORMAL HIGH (ref 70–99)
Potassium: 4.2 mEq/L (ref 3.5–5.1)
Sodium: 138 mEq/L (ref 135–145)
Total Protein: 6.9 g/dL (ref 6.0–8.3)

## 2017-07-07 LAB — AMYLASE: AMYLASE: 24 U/L — AB (ref 27–131)

## 2017-07-07 LAB — LIPASE: LIPASE: 3 U/L — AB (ref 11.0–59.0)

## 2017-07-07 MED ORDER — MORPHINE SULFATE 15 MG PO TABS: 15.0000 mg | ORAL_TABLET | ORAL | 0 refills | Status: DC | PRN

## 2017-07-07 NOTE — Telephone Encounter (Signed)
Will discuss next week Likely PET if pain still present Can go ahead with PET order if patient prefers  Please call Ms. Jaffer and let me know

## 2017-07-07 NOTE — Telephone Encounter (Signed)
She should push fluids, labs (cbc, cmet, amylase/lipase) and continue on pain meds orally.

## 2017-07-07 NOTE — Telephone Encounter (Signed)
The pt is having pain since just after the block.  She is taking hydrocodone  2 tabs every 4 hours.  Pain radiates to the back.  This is worse than prior to the block. She has nausea, SOB, she has AFIB and it has worsened, however, the afib is better this morning. Please advise.

## 2017-07-07 NOTE — Telephone Encounter (Addendum)
Spoke with pt in lobby. She reports she took #4 doses of Dilaudid. It made her anxious, she reports she is unable to tolerate Dilaudid. Oxycodone made her itch in the past. She rates L side pain at 6/10. 2 Hydrocodone "takes the edge off." She has notified Dr. [name]. Had labs done this AM. Reviewed with Dr. [name]: Order received for [facility] 15mg  Q4hr PRN. Spoke with pt in lobby. MSIR instructions reviewed. She voiced understanding. Pt returned the Hydromorphone script written on 07/01/17. It was destroyed by this RN.

## 2017-07-07 NOTE — Telephone Encounter (Signed)
Call from pt's daughter requesting additional scans to evaluate her pain. They are concerned because the celiac block did not help. Message to MD.

## 2017-07-07 NOTE — Telephone Encounter (Signed)
The pt has been advised and will push fluids, continue meds and have labs.

## 2017-07-08 ENCOUNTER — Telehealth: Payer: Self-pay

## 2017-07-08 ENCOUNTER — Other Ambulatory Visit: Payer: Self-pay

## 2017-07-08 DIAGNOSIS — C259 Malignant neoplasm of pancreas, unspecified: Secondary | ICD-10-CM

## 2017-07-08 NOTE — Telephone Encounter (Signed)
Spoke with patient regarding her pain and asked her per MD [name] if she wants to go ahead with getting a PET scan scheduled. Pt states that she prefers to go ahead and get a PET scan. This RN informed the patient that she would let MD [name] know and that we would start the process to get a PET scheduled. Pt verbalizes understanding. Pt verbalizes understanding that she will be contacted with an appointment time.

## 2017-07-11 ENCOUNTER — Other Ambulatory Visit: Payer: Self-pay | Admitting: Cardiology

## 2017-07-14 NOTE — Telephone Encounter (Signed)
Age 71 years 07/01/2017 Wt 73.5kg Saw Dr Marlou Porch on 02/18/2017 07/07/2017 SrCr 0.66  Hgb 11.8 Hct 34.2 CrCl 92.02 Refill done for Xarelto 20 mg daily as requested

## 2017-07-15 ENCOUNTER — Telehealth: Payer: Self-pay | Admitting: Nurse Practitioner

## 2017-07-15 NOTE — Telephone Encounter (Signed)
Spoke with patient and rescheduled appt for after her pet scan.

## 2017-07-16 ENCOUNTER — Other Ambulatory Visit (HOSPITAL_COMMUNITY)
Admission: RE | Admit: 2017-07-16 | Discharge: 2017-07-16 | Disposition: A | Discharge status: Home / Self Care | Payer: Medicare Other | Source: Ambulatory Visit | Attending: Gynecologic Oncology | Admitting: Gynecologic Oncology

## 2017-07-16 ENCOUNTER — Encounter: Payer: Self-pay | Admitting: Gynecologic Oncology

## 2017-07-16 ENCOUNTER — Ambulatory Visit: Payer: Medicare Other | Attending: Gynecologic Oncology | Admitting: Gynecologic Oncology

## 2017-07-16 ENCOUNTER — Ambulatory Visit: Payer: Medicare Other | Admitting: Nurse Practitioner

## 2017-07-16 ENCOUNTER — Other Ambulatory Visit: Payer: Self-pay | Admitting: Gynecologic Oncology

## 2017-07-16 VITALS — BP 127/66 | HR 48 | Temp 97.9°F | Resp 20 | Wt 153.1 lb

## 2017-07-16 DIAGNOSIS — Z8041 Family history of malignant neoplasm of ovary: Secondary | ICD-10-CM | POA: Insufficient documentation

## 2017-07-16 DIAGNOSIS — Z9221 Personal history of antineoplastic chemotherapy: Secondary | ICD-10-CM | POA: Insufficient documentation

## 2017-07-16 DIAGNOSIS — Z9851 Tubal ligation status: Secondary | ICD-10-CM | POA: Diagnosis not present

## 2017-07-16 DIAGNOSIS — Z833 Family history of diabetes mellitus: Secondary | ICD-10-CM | POA: Diagnosis not present

## 2017-07-16 DIAGNOSIS — Z803 Family history of malignant neoplasm of breast: Secondary | ICD-10-CM | POA: Diagnosis not present

## 2017-07-16 DIAGNOSIS — C541 Malignant neoplasm of endometrium: Secondary | ICD-10-CM

## 2017-07-16 DIAGNOSIS — Z9049 Acquired absence of other specified parts of digestive tract: Secondary | ICD-10-CM | POA: Diagnosis not present

## 2017-07-16 DIAGNOSIS — Z882 Allergy status to sulfonamides status: Secondary | ICD-10-CM | POA: Diagnosis not present

## 2017-07-16 DIAGNOSIS — Z808 Family history of malignant neoplasm of other organs or systems: Secondary | ICD-10-CM | POA: Diagnosis not present

## 2017-07-16 DIAGNOSIS — Z7901 Long term (current) use of anticoagulants: Secondary | ICD-10-CM | POA: Diagnosis not present

## 2017-07-16 DIAGNOSIS — Z79899 Other long term (current) drug therapy: Secondary | ICD-10-CM | POA: Diagnosis not present

## 2017-07-16 DIAGNOSIS — Z9889 Other specified postprocedural states: Secondary | ICD-10-CM | POA: Insufficient documentation

## 2017-07-16 DIAGNOSIS — Z90710 Acquired absence of both cervix and uterus: Secondary | ICD-10-CM | POA: Diagnosis not present

## 2017-07-16 DIAGNOSIS — Z8542 Personal history of malignant neoplasm of other parts of uterus: Secondary | ICD-10-CM | POA: Insufficient documentation

## 2017-07-16 DIAGNOSIS — Z823 Family history of stroke: Secondary | ICD-10-CM | POA: Insufficient documentation

## 2017-07-16 DIAGNOSIS — Z923 Personal history of irradiation: Secondary | ICD-10-CM | POA: Insufficient documentation

## 2017-07-16 DIAGNOSIS — C259 Malignant neoplasm of pancreas, unspecified: Secondary | ICD-10-CM | POA: Insufficient documentation
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
## 2017-07-16 NOTE — Patient Instructions (Signed)
I will notify you of the results of your Pap smear from today. As we discussed options at [facility] would include Dr. Lauree Chandler and Dr. Junius Creamer.

## 2017-07-19 ENCOUNTER — Encounter (HOSPITAL_COMMUNITY)
Admission: RE | Admit: 2017-07-19 | Discharge: 2017-07-19 | Disposition: A | Discharge status: Home / Self Care | Payer: Medicare Other | Source: Ambulatory Visit | Attending: Oncology | Admitting: Oncology

## 2017-07-19 DIAGNOSIS — C259 Malignant neoplasm of pancreas, unspecified: Secondary | ICD-10-CM | POA: Diagnosis not present

## 2017-07-19 LAB — GLUCOSE, CAPILLARY
GLUCOSE-CAPILLARY: 63 mg/dL — AB (ref 65–99)
Glucose-Capillary: 92 mg/dL (ref 65–99)

## 2017-07-19 MED ORDER — FLUDEOXYGLUCOSE F - 18 (FDG) INJECTION
8.4000 | Freq: Once | INTRAVENOUS | Status: AC | PRN
  Administered 2017-07-19: 8.4 via INTRAVENOUS

## 2017-07-21 ENCOUNTER — Telehealth: Payer: Self-pay | Admitting: Oncology

## 2017-07-21 ENCOUNTER — Ambulatory Visit (HOSPITAL_BASED_OUTPATIENT_CLINIC_OR_DEPARTMENT_OTHER): Payer: Medicare Other | Admitting: Nurse Practitioner

## 2017-07-21 VITALS — BP 140/63 | HR 51 | Temp 97.6°F | Resp 18 | Ht 63.0 in | Wt 153.0 lb

## 2017-07-21 DIAGNOSIS — Z8542 Personal history of malignant neoplasm of other parts of uterus: Secondary | ICD-10-CM

## 2017-07-21 DIAGNOSIS — Z7901 Long term (current) use of anticoagulants: Secondary | ICD-10-CM | POA: Diagnosis not present

## 2017-07-21 DIAGNOSIS — G893 Neoplasm related pain (acute) (chronic): Secondary | ICD-10-CM

## 2017-07-21 DIAGNOSIS — I4891 Unspecified atrial fibrillation: Secondary | ICD-10-CM

## 2017-07-21 DIAGNOSIS — C250 Malignant neoplasm of head of pancreas: Secondary | ICD-10-CM | POA: Diagnosis not present

## 2017-07-21 MED ORDER — MORPHINE SULFATE 15 MG PO TABS: 15.0000 mg | ORAL_TABLET | ORAL | 0 refills | Status: DC | PRN

## 2017-07-21 NOTE — Telephone Encounter (Signed)
Gave patient avs report and appointments for November. Per patient all labs should be drawn at 11/6 visit.

## 2017-07-21 NOTE — Progress Notes (Addendum)
[place] OFFICE PROGRESS NOTE   Diagnosis:  Pancreas cancer  INTERVAL HISTORY:   Ms. Demetrius returns as scheduled. She underwent the celiac block procedure 07/03/2017. She continues to have abdominal and back pain. She notes improvement in the back pain following morphine but not the abdominal pain. Bowels moving. Appetite is poor. She is losing weight.  Objective:  Vital signs in last 24 hours:  Blood pressure 140/63, pulse (!) 51, temperature 97.6 F (36.4 C), temperature source Oral, resp. rate 18, height 5\' 3"  (1.6 m), weight 153 lb (69.4 kg), SpO2 99 %.    HEENT: no thrush or ulcers. Resp: lungs clear bilaterally. Cardio: regular rate and rhythm. GI: no hepatomegaly. Tenderness mid to left upper abdomen. Vascular: no leg edema.   Port-A-Cath without erythema.  Lab Results:  Lab Results  Component Value Date   WBC 3.8 (L) 07/07/2017   HGB 11.8 (L) 07/07/2017   HCT 34.2 (L) 07/07/2017   MCV 87.9 07/07/2017   PLT 191.0 07/07/2017   NEUTROABS 1.8 07/07/2017    Imaging:  No results found.  Medications: I have reviewed the patient's current medications.  Assessment/Plan: 1. 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  Gemcitabine held 11/02/2014 due to neutropenia.  Gemcitabine 11/09/2014 dose reduced 800 mg/m.  Gemcitabine held 11/16/2014 due to neutropenia.  Gemcitabine resumed 11/23/2014 every 2 week schedule.  Cycle 6 gemcitabine 01/05/2015  Cycle 7 gemcitabine 01/18/2015  Cycle 8 gemcitabine 02/01/2015  Cycle 9 gemcitabine 02/15/2015  Cycle 10 gemcitabine 03/01/2015  Cycle 11 gemcitabine  03/15/2015  Cycle 12 gemcitabine 03/29/2015  Elevated CA 19-01 May 2016  CTs 06/17/2016-new soft tissue mass at the root of the mesentery with vascular involvement  PET 06/27/2016-hypermetabolic activity associated with soft tissue adjacent to surgical clips in the central mesentery  Status post SBRTto the mesenteric mass completed 07/26/2016  CT abdomen/pelvis 08/23/2016 -mesenteric mass stable to slightly decreased in size.  Cycle 1 FOLFOX 09/03/2016  Cycle 2 FOLFOX 09/30/2016  Cycle 3 FOLFOX 10/14/2016   Cycle 4 FOLFOX 11/05/2016 (5-FU bolus eliminated and 5-FU pump dose reduced)  Cycle 5 FOLFOX 11/19/2016  CT abdomen/pelvis 11/28/2016-decreased size of soft tissue at the small bowel mesentery, mild asymmetric soft tissue at the left vaginal cuff  Cycle 6 FOLFOX 12/10/2016 (5-FU infusion further reduced and oxaliplatin reduced)  Cycle 7 FOLFOX 12/31/2016  Cycle 8 FOLFOX 01/21/2017   CT 02/24/2017-slight decrease in size of the mesenteric mass, resolution of soft tissue fullness at the left vaginal cuff, no evidence of disease progression  CT abdomen/pelvis 06/20/2017-stable ill-defined soft tissue at the mesenteric root, mild increased asymmetry at the left vaginal cuff, no other evidence of disease progression   2. bile duct obstruction secondary to #1, status post an ERCP with stent placement 09/23/2015hypermetabolic soft tissue in the central mesentery, no other evidence of metastatic disease, stable mildly enlarged portal caval node  3. Admission with post ERCP pancreatitis 06/16/2014  4. History of abdominal pain secondary to #1  5. Pulmonary embolism diagnosed on a CT of the abdomen 09/16/2014  Negative lower extremity Dopplers 09/17/2014  6. Multiple orthopedic surgical procedures  7. Endometrial cancer,stage IA, grade 1 endometrioid adenocarcinoma, 18% myometrial invasion, no lymphovascular space involvement, negative  washings  Status  post robotic total hysterectomy and bilateral salpingo-oophorectomy 11/30/2010  Recurrent tumor left lateral vagina status post biopsy 11/24/2014 with pathology confirming adenocarcinoma with focal squamous differentiation consistent with endometrial adenocarcinoma  Staging CT scans 12/06/2014 with no evidence of local pancreatic cancer recurrence. Small fluid collection adjacent to the left adrenal gland. Severe hepatic steatosis. No evidence of local extension of endometrial carcinoma. Carcinoma not well-defined at the vaginal cuff. 5 mm right external iliac lymph node. 3.6 mm left external iliac lymph node  Brachytherapy initiated 12/22/2014, completed 01/19/2015  CT abdomen/pelvis 07/24/2015 revealed a 3 x 4 cm soft tissue focus at the vaginal apex  PET scan 08/11/2015 revealed no mass at the vaginal apex and no evidence of metastatic disease  CT 11/28/2016-mild asymmetric soft tissue at the left vaginal cuff, resolved on CT 02/24/2017  CT abdomen/pelvis 06/20/2017-stable mild ill-defined soft tissue density in the mesenteric root. Stable mild portacaval lymphadenopathy and subcentimeter right retroperitoneal lymph nodes. Mild increased size of asymmetric soft tissue density involving the left vaginal cuff.  PET scan 07/19/2017-no suspicious hypermetabolic activity within the neck, chest, abdomen or pelvis. Specifically no evidence of residual hypermetabolic tumor in the surgical bed or at the vaginal cuff. Hypermetabolic activity in the lumbar spine at the level of the right L3-4 facet joint appears degenerative.  8. History of atrial fibrillation-maintained on xarelto  9. Family history of multiple cancers-negative CancerNext gene panel  10. Prolonged nausea following the pancreaticoduodenectomy. Improved 10/26/2014.  11. Port-A-Cath placement 10/21/2014.  12. History of Neutropenia secondary to chemotherapy   13. Diarrhea. Question pancreatic  insufficiency. Pancreatic enzyme replacement initiated 01/05/2015. Recurrent diarrhea following a course of antibiotics March 2017.  14. History of positional vertigo-resolved  15. 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 Neutropenia secondary to chemotherapy 09/18/2016.  17. Delayed nausea and diarrhea following FOLFOX. Emend added with cycle 2. Decadron prophylaxis added with cycle 3  18. Diarrhea 10/28/2016. Question related to chemotherapy. Negative C. difficile testing 10/31/2016.  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   Disposition: Ms. Zaragoza continues to have abdominal/back pain. She has noted no improvement since the celiac block. The recent PET scan shows no evidence of progressive disease.  Dr. [name] reviewed the PET scan result with Ms. Crain and her family. They understand that despite the negative PET scan the concern remains that the pain is related to cancer. Dr. [name] will present her case at the upcoming tumor conference. She will continue immediate release morphine as needed. We discussed a referral for a second opinion. She is undecided regarding this at present.  Consider adding appetite stimulant if further weight loss when she returns for next visit.   She will return for a follow-up visit in one to 2 weeks. She will contact the office in the interim with any problems.  Patient seen with Dr. [name]. 25 minutes were spent face-to-face at today's visit with the majority of that time involved in counseling/coordination of care.    Ned Card ANP/GNP-BC   07/21/2017  2:24 PM  This was a shared visit with Ned Card. Ms. Griebel was interviewed and examined. She continues to have abdomen and back pain. There is no explanation for the pain on CT. I suspect the pain is related to local progression of pancreas cancer or the development of carcinomatosis. We discussed a referral for  a second opinion. We also discussed initiating a long-acting narcotic. She would like to hold on these for now.  I will present her case at  the GI tumor conference within the next few weeks. I do not recommend a trial of salvage chemotherapy in the absence of measurable disease. I reviewed the PET images.  Julieanne Manson, M.D.

## 2017-07-22 LAB — CYTOLOGY - PAP

## 2017-07-29 ENCOUNTER — Ambulatory Visit (HOSPITAL_BASED_OUTPATIENT_CLINIC_OR_DEPARTMENT_OTHER): Payer: Medicare Other

## 2017-07-29 ENCOUNTER — Telehealth: Payer: Self-pay | Admitting: Oncology

## 2017-07-29 ENCOUNTER — Ambulatory Visit (HOSPITAL_BASED_OUTPATIENT_CLINIC_OR_DEPARTMENT_OTHER): Payer: Medicare Other | Admitting: Oncology

## 2017-07-29 ENCOUNTER — Other Ambulatory Visit: Payer: Medicare Other

## 2017-07-29 VITALS — BP 115/67 | HR 48 | Temp 97.9°F | Resp 18 | Ht 63.0 in | Wt 150.8 lb

## 2017-07-29 DIAGNOSIS — C250 Malignant neoplasm of head of pancreas: Secondary | ICD-10-CM | POA: Diagnosis not present

## 2017-07-29 DIAGNOSIS — Z95828 Presence of other vascular implants and grafts: Secondary | ICD-10-CM

## 2017-07-29 DIAGNOSIS — C541 Malignant neoplasm of endometrium: Secondary | ICD-10-CM

## 2017-07-29 DIAGNOSIS — Z452 Encounter for adjustment and management of vascular access device: Secondary | ICD-10-CM

## 2017-07-29 MED ORDER — SODIUM CHLORIDE 0.9 % IJ SOLN
10.0000 mL | INTRAMUSCULAR | Status: DC | PRN
  Administered 2017-07-29: 10 mL via INTRAVENOUS
  Filled 2017-07-29: qty 10

## 2017-07-29 MED ORDER — HEPARIN SOD (PORK) LOCK FLUSH 100 UNIT/ML IV SOLN
500.0000 [IU] | Freq: Once | INTRAVENOUS | Status: DC | PRN
  Filled 2017-07-29: qty 5

## 2017-07-29 MED ORDER — AMITRIPTYLINE HCL 25 MG PO TABS: 25.0000 mg | ORAL_TABLET | Freq: Every day | ORAL | 0 refills | Status: DC

## 2017-07-29 NOTE — Telephone Encounter (Signed)
[place] appt per 11/6 los - Gave patient AVS and calender per los.

## 2017-07-29 NOTE — Addendum Note (Signed)
Addended by: Brien Few on: 07/29/2017 09:26 AM   Modules accepted: Orders

## 2017-07-29 NOTE — Progress Notes (Signed)
[place] OFFICE PROGRESS NOTE   Diagnosis: Pancreas cancer  INTERVAL HISTORY:   [name] returns for a scheduled visit.  She has noted mild improvement in pain over the past week.  She is taking MSIR once daily.  She continues to have a lack of taste for food and altered smell.  She had an episode of severe abdominal pain a few days ago.  She reports the skin at the left upper abdomen was tender to light touch.  Objective:  Vital signs in last 24 hours:  Blood pressure 115/67, pulse (!) 48, temperature 97.9 F (36.6 C), temperature source Oral, resp. rate 18, height 5\' 3"  (1.6 m), weight 150 lb 12.8 oz (68.4 kg), SpO2 98 %.   Resp: Lungs clear bilaterally Cardio: Regular rate and rhythm GI: No hepatomegaly, no mass, tender in the mid and left upper abdomen Vascular: Edema  Portacath/PICC-without erythema  Lab Results:  Lab Results  Component Value Date   WBC 3.8 (L) 07/07/2017   HGB 11.8 (L) 07/07/2017   HCT 34.2 (L) 07/07/2017   MCV 87.9 07/07/2017   PLT 191.0 07/07/2017   NEUTROABS 1.8 07/07/2017     Medications: I have reviewed the patient's current medications.  Assessment/Plan: 1. 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  Gemcitabine held 11/02/2014 due to neutropenia.  Gemcitabine 11/09/2014 dose reduced 800 mg/m.  Gemcitabine held 11/16/2014 due to neutropenia.  Gemcitabine resumed 11/23/2014 every 2 week schedule.  Cycle 6 gemcitabine 01/05/2015  Cycle 7 gemcitabine 01/18/2015  Cycle 8 gemcitabine 02/01/2015  Cycle 9 gemcitabine 02/15/2015  Cycle 10 gemcitabine 03/01/2015  Cycle 11  gemcitabine 03/15/2015  Cycle 12 gemcitabine 03/29/2015  Elevated CA 19-01 May 2016  CTs 06/17/2016-new soft tissue mass at the root of the mesentery with vascular involvement  PET 06/27/2016-hypermetabolic activity associated with soft tissue adjacent to surgical clips in the central mesentery  Status post SBRTto the mesenteric mass completed 07/26/2016  CT abdomen/pelvis 08/23/2016 -mesenteric mass stable to slightly decreased in size.  Cycle 1 FOLFOX 09/03/2016  Cycle 2 FOLFOX 09/30/2016  Cycle 3 FOLFOX 10/14/2016   Cycle 4 FOLFOX 11/05/2016 (5-FU bolus eliminated and 5-FU pump dose reduced)  Cycle 5 FOLFOX 11/19/2016  CT abdomen/pelvis 11/28/2016-decreased size of soft tissue at the small bowel mesentery, mild asymmetric soft tissue at the left vaginal cuff  Cycle 6 FOLFOX 12/10/2016 (5-FU infusion further reduced and oxaliplatin reduced)  Cycle 7 FOLFOX 12/31/2016  Cycle 8 FOLFOX 01/21/2017   CT 02/24/2017-slight decrease in size of the mesenteric mass, resolution of soft tissue fullness at the left vaginal cuff, no evidence of disease progression  CT abdomen/pelvis 06/20/2017-stable ill-defined soft tissue at the mesenteric root, mild increased asymmetry at the left vaginal cuff, no other evidence of disease progression   2. bile duct obstruction secondary to #1, status post an ERCP with stent placement 09/23/2015hypermetabolic soft tissue in the central mesentery, no other evidence of metastatic disease, stable mildly enlarged portal caval node  3. Admission with post ERCP pancreatitis 06/16/2014  4. History of abdominal pain secondary to #1  5. Pulmonary embolism diagnosed on a CT of the abdomen 09/16/2014  Negative lower extremity Dopplers 09/17/2014  6. Multiple orthopedic surgical procedures  7. Endometrial cancer,stage IA, grade 1  endometrioid adenocarcinoma, 18% myometrial invasion, no lymphovascular space involvement, negative  washings  Status post robotic total hysterectomy and bilateral salpingo-oophorectomy 11/30/2010  Recurrent tumor left lateral vagina status post biopsy 11/24/2014 with pathology confirming adenocarcinoma with focal squamous differentiation consistent with endometrial adenocarcinoma  Staging CT scans 12/06/2014 with no evidence of local pancreatic cancer recurrence. Small fluid collection adjacent to the left adrenal gland. Severe hepatic steatosis. No evidence of local extension of endometrial carcinoma. Carcinoma not well-defined at the vaginal cuff. 5 mm right external iliac lymph node. 3.6 mm left external iliac lymph node  Brachytherapy initiated 12/22/2014, completed 01/19/2015  CT abdomen/pelvis 07/24/2015 revealed a 3 x 4 cm soft tissue focus at the vaginal apex  PET scan 08/11/2015 revealed no mass at the vaginal apex and no evidence of metastatic disease  CT 11/28/2016-mild asymmetric soft tissue at the left vaginal cuff, resolved on CT 02/24/2017  CT abdomen/pelvis 06/20/2017-stable mild ill-defined soft tissue density in the mesenteric root. Stable mild portacaval lymphadenopathy and subcentimeter right retroperitoneal lymph nodes. Mild increased size of asymmetric soft tissue density involving the left vaginal cuff.  PET scan 07/19/2017-no suspicious hypermetabolic activity within the neck, chest, abdomen or pelvis. Specifically no evidence of residual hypermetabolic tumor in the surgical bed or at the vaginal cuff. Hypermetabolic activity in the lumbar spine at the level of the right L3-4 facet joint appears degenerative.  8. History of atrial fibrillation-maintained on xarelto  9. Family history of multiple cancers-negative CancerNext gene panel  10. Prolonged nausea following the pancreaticoduodenectomy. Improved 10/26/2014.  11. Port-A-Cath placement 10/21/2014.  12. History of Neutropenia secondary to chemotherapy   13. Diarrhea. Question pancreatic  insufficiency. Pancreatic enzyme replacement initiated 01/05/2015. Recurrent diarrhea following a course of antibiotics March 2017.  14. History of positional vertigo-resolved  15. 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 Neutropenia secondary to chemotherapy 09/18/2016.  17. Delayed nausea and diarrhea following FOLFOX. Emend added with cycle 2. Decadron prophylaxis added with cycle 3  18. Diarrhea 10/28/2016. Question related to chemotherapy. Negative C. difficile testing 10/31/2016.  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    Disposition:  Her overall status appears unchanged.  She continues to have abdomen/back pain.  There may be a neuropathic component.  She will begin a trial of amitriptyline.  Ms. Moree does not wish to begin a long-acting narcotic.  She will return for an office visit in 2 weeks.  I will present her case at the GI tumor conference tomorrow.  15 minutes were spent with the patient today.  The majority of the time was used for counseling and coordination of care.  Donneta Romberg, MD  07/29/2017  8:36 AM

## 2017-07-30 LAB — CANCER ANTIGEN 19-9: CAN 19-9: 37 U/mL — AB (ref 0–35)

## 2017-08-08 ENCOUNTER — Encounter: Payer: Self-pay | Admitting: Cardiology

## 2017-08-12 ENCOUNTER — Telehealth: Payer: Self-pay | Admitting: Oncology

## 2017-08-12 ENCOUNTER — Ambulatory Visit: Payer: Medicare Other | Admitting: Oncology

## 2017-08-12 VITALS — BP 122/75 | HR 50 | Temp 97.7°F | Resp 17 | Ht 63.0 in | Wt 152.3 lb

## 2017-08-12 DIAGNOSIS — I4891 Unspecified atrial fibrillation: Secondary | ICD-10-CM | POA: Diagnosis not present

## 2017-08-12 DIAGNOSIS — Z8542 Personal history of malignant neoplasm of other parts of uterus: Secondary | ICD-10-CM

## 2017-08-12 DIAGNOSIS — C250 Malignant neoplasm of head of pancreas: Secondary | ICD-10-CM

## 2017-08-12 DIAGNOSIS — Z7901 Long term (current) use of anticoagulants: Secondary | ICD-10-CM

## 2017-08-12 NOTE — Telephone Encounter (Signed)
Scheduled appt per 11/20 los - Gave patient AVS and calender per los.  

## 2017-08-12 NOTE — Progress Notes (Signed)
[place] OFFICE PROGRESS NOTE   Diagnosis: Pancreas cancer  INTERVAL HISTORY:   [name] returns as scheduled.  She began Elavil following an office visit 2 weeks ago.  She has noted partial improvement in abdominal pain since beginning Elavil.  She stopped the Elavil for a few days and the pain worsened.  She is taking the Elavil at night. The pain is worse in the mornings.  She takes 1 MSIR tablet per day for relief of pain.  Her appetite has improved.  The left-sided back pain has improved.  Objective:  Vital signs in last 24 hours:  Blood pressure 122/75, pulse (!) 50, temperature 97.7 F (36.5 C), temperature source Oral, resp. rate 17, height 5\' 3"  (1.6 m), weight 152 lb 4.8 oz (69.1 kg), SpO2 100 %.   Resp: Lungs clear bilaterally Cardio: Regular rate and rhythm GI: No hepatosplenomegaly, mild diffuse tenderness, no mass Vascular: No leg edema   Portacath/PICC-without erythema  Medications: I have reviewed the patient's current medications.  Assessment/Plan: 1. 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  Gemcitabine held 11/02/2014 due to neutropenia.  Gemcitabine 11/09/2014 dose reduced 800 mg/m.  Gemcitabine held 11/16/2014 due to neutropenia.  Gemcitabine resumed 11/23/2014 every 2 week schedule.  Cycle 6 gemcitabine 01/05/2015  Cycle 7 gemcitabine 01/18/2015  Cycle 8 gemcitabine 02/01/2015  Cycle 9 gemcitabine 02/15/2015  Cycle 10 gemcitabine 03/01/2015  Cycle 11 gemcitabine 03/15/2015  Cycle 12 gemcitabine 03/29/2015  Elevated CA 19-01 May 2016  CTs 06/17/2016-new soft tissue mass at the root  of the mesentery with vascular involvement  PET 06/27/2016-hypermetabolic activity associated with soft tissue adjacent to surgical clips in the central mesentery  Status post SBRTto the mesenteric mass completed 07/26/2016  CT abdomen/pelvis 08/23/2016 -mesenteric mass stable to slightly decreased in size.  Cycle 1 FOLFOX 09/03/2016  Cycle 2 FOLFOX 09/30/2016  Cycle 3 FOLFOX 10/14/2016   Cycle 4 FOLFOX 11/05/2016 (5-FU bolus eliminated and 5-FU pump dose reduced)  Cycle 5 FOLFOX 11/19/2016  CT abdomen/pelvis 11/28/2016-decreased size of soft tissue at the small bowel mesentery, mild asymmetric soft tissue at the left vaginal cuff  Cycle 6 FOLFOX 12/10/2016 (5-FU infusion further reduced and oxaliplatin reduced)  Cycle 7 FOLFOX 12/31/2016  Cycle 8 FOLFOX 01/21/2017   CT 02/24/2017-slight decrease in size of the mesenteric mass, resolution of soft tissue fullness at the left vaginal cuff, no evidence of disease progression  CT abdomen/pelvis 06/20/2017-stable ill-defined soft tissue at the mesenteric root, mild increased asymmetry at the left vaginal cuff, no other evidence of disease progression   2. bile duct obstruction secondary to #1, status post an ERCP with stent placement 09/23/2015hypermetabolic soft tissue in the central mesentery, no other evidence of metastatic disease, stable mildly enlarged portal caval node  3. Admission with post ERCP pancreatitis 06/16/2014  4. History of abdominal pain secondary to #1  5. Pulmonary embolism diagnosed on a CT of the abdomen 09/16/2014  Negative lower extremity Dopplers 09/17/2014  6. Multiple orthopedic surgical procedures  7. Endometrial cancer,stage IA, grade 1 endometrioid adenocarcinoma, 18% myometrial invasion, no lymphovascular space involvement, negative washings  Status post robotic total hysterectomy and bilateral salpingo-oophorectomy 11/30/2010  Recurrent tumor left lateral vagina status  post biopsy 11/24/2014 with pathology confirming  adenocarcinoma with focal squamous differentiation consistent with endometrial adenocarcinoma  Staging CT scans 12/06/2014 with no evidence of local pancreatic cancer recurrence. Small fluid collection adjacent to the left adrenal gland. Severe hepatic steatosis. No evidence of local extension of endometrial carcinoma. Carcinoma not well-defined at the vaginal cuff. 5 mm right external iliac lymph node. 3.6 mm left external iliac lymph node  Brachytherapy initiated 12/22/2014, completed 01/19/2015  CT abdomen/pelvis 07/24/2015 revealed a 3 x 4 cm soft tissue focus at the vaginal apex  PET scan 08/11/2015 revealed no mass at the vaginal apex and no evidence of metastatic disease  CT 11/28/2016-mild asymmetric soft tissue at the left vaginal cuff, resolved on CT 02/24/2017  CT abdomen/pelvis 06/20/2017-stable mild ill-defined soft tissue density in the mesenteric root. Stable mild portacaval lymphadenopathy and subcentimeter right retroperitoneal lymph nodes. Mild increased size of asymmetric soft tissue density involving the left vaginal cuff.  PET scan 07/19/2017-no suspicious hypermetabolic activity within the neck, chest, abdomen or pelvis. Specifically no evidence of residual hypermetabolic tumor in the surgical bed or at the vaginal cuff. Hypermetabolic activity in the lumbar spine at the level of the right L3-4 facet joint appears degenerative.  8. History of atrial fibrillation-maintained on xarelto  9. Family history of multiple cancers-negative CancerNext gene panel  10. Prolonged nausea following the pancreaticoduodenectomy. Improved 10/26/2014.  11. Port-A-Cath placement 10/21/2014.  12. History of Neutropenia secondary to chemotherapy   13. Diarrhea. Question pancreatic insufficiency. Pancreatic enzyme replacement initiated 01/05/2015. Recurrent diarrhea following a course of antibiotics March 2017.  14. History of  positional vertigo-resolved  15. 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 Neutropenia secondary to chemotherapy 09/18/2016.  17. Delayed nausea and diarrhea following FOLFOX. Emend added with cycle 2. Decadron prophylaxis added with cycle 3  18. Diarrhea 10/28/2016. Question related to chemotherapy. Negative C. difficile testing 10/31/2016.  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    Disposition:  [name] appears improved.  The pain has partially improved with amitriptyline.  She will continue amitriptyline and as needed MSIR.  She will return for an office visit and Port-A-Cath flush in 1 month. I will present her case at the GI tumor conference a few weeks ago.  A diagnostic laparoscopy is not recommended.  15 minutes were spent with the patient today.  The majority of the time was used for counseling and coordination of care.  Betsy Coder, MD  08/12/2017  9:06 AM

## 2017-08-20 ENCOUNTER — Ambulatory Visit: Payer: Medicare Other | Admitting: Cardiology

## 2017-08-20 ENCOUNTER — Encounter: Payer: Self-pay | Admitting: Cardiology

## 2017-08-20 VITALS — BP 102/60 | HR 61 | Ht 63.0 in | Wt 155.8 lb

## 2017-08-20 DIAGNOSIS — Z86711 Personal history of pulmonary embolism: Secondary | ICD-10-CM

## 2017-08-20 DIAGNOSIS — R001 Bradycardia, unspecified: Secondary | ICD-10-CM | POA: Diagnosis not present

## 2017-08-20 DIAGNOSIS — I480 Paroxysmal atrial fibrillation: Secondary | ICD-10-CM
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
## 2017-08-20 NOTE — Patient Instructions (Signed)

## 2017-08-26 ENCOUNTER — Other Ambulatory Visit: Payer: Self-pay | Admitting: *Deleted

## 2017-08-26 MED ORDER — AMITRIPTYLINE HCL 25 MG PO TABS: 25.0000 mg | ORAL_TABLET | Freq: Every day | ORAL | 2 refills | Status: DC

## 2017-09-10 ENCOUNTER — Ambulatory Visit: Payer: Medicare Other | Admitting: Gynecologic Oncology

## 2017-09-11 ENCOUNTER — Other Ambulatory Visit: Payer: Medicare Other

## 2017-09-11 ENCOUNTER — Ambulatory Visit: Payer: Medicare Other | Admitting: Nurse Practitioner

## 2017-09-11 ENCOUNTER — Telehealth: Payer: Self-pay | Admitting: Oncology

## 2017-09-11 ENCOUNTER — Ambulatory Visit (HOSPITAL_BASED_OUTPATIENT_CLINIC_OR_DEPARTMENT_OTHER): Payer: Medicare Other

## 2017-09-11 ENCOUNTER — Encounter: Payer: Self-pay | Admitting: Nurse Practitioner

## 2017-09-11 VITALS — BP 121/80 | HR 52 | Temp 97.8°F | Resp 20 | Ht 63.0 in | Wt 150.2 lb

## 2017-09-11 DIAGNOSIS — Z8542 Personal history of malignant neoplasm of other parts of uterus: Secondary | ICD-10-CM | POA: Diagnosis not present

## 2017-09-11 DIAGNOSIS — Z8507 Personal history of malignant neoplasm of pancreas: Secondary | ICD-10-CM | POA: Diagnosis not present

## 2017-09-11 DIAGNOSIS — Z95828 Presence of other vascular implants and grafts: Secondary | ICD-10-CM

## 2017-09-11 DIAGNOSIS — Z7901 Long term (current) use of anticoagulants: Secondary | ICD-10-CM

## 2017-09-11 DIAGNOSIS — C541 Malignant neoplasm of endometrium: Secondary | ICD-10-CM

## 2017-09-11 DIAGNOSIS — C250 Malignant neoplasm of head of pancreas: Secondary | ICD-10-CM

## 2017-09-11 DIAGNOSIS — I4891 Unspecified atrial fibrillation: Secondary | ICD-10-CM

## 2017-09-11 DIAGNOSIS — Z452 Encounter for adjustment and management of vascular access device: Secondary | ICD-10-CM

## 2017-09-11 MED ORDER — SODIUM CHLORIDE 0.9 % IJ SOLN
10.0000 mL | INTRAMUSCULAR | Status: DC | PRN
  Administered 2017-09-11: 10 mL via INTRAVENOUS
  Filled 2017-09-11: qty 10

## 2017-09-11 MED ORDER — AMITRIPTYLINE HCL 25 MG PO TABS: 25.0000 mg | ORAL_TABLET | Freq: Every day | ORAL | 2 refills | Status: DC

## 2017-09-11 MED ORDER — HEPARIN SOD (PORK) LOCK FLUSH 100 UNIT/ML IV SOLN
500.0000 [IU] | Freq: Once | INTRAVENOUS | Status: AC | PRN
  Administered 2017-09-11: 500 [IU] via INTRAVENOUS
  Filled 2017-09-11: qty 5

## 2017-09-11 NOTE — Progress Notes (Addendum)
[facility] OFFICE PROGRESS NOTE   Diagnosis: Pancreas cancer  INTERVAL HISTORY:   [name] returns as scheduled.  Abdominal pain continues to be improved since beginning amitriptyline.  She takes morphine maybe once a day.  She describes her appetite as "okay".  She has intermittent nausea and takes Phenergan as needed.  No vomiting.  Bowels moving regularly.  Objective:  Vital signs in last 24 hours:  Blood pressure 121/80, pulse (!) 52, temperature 97.8 F (36.6 C), temperature source Oral, resp. rate 20, height 5\' 3"  (1.6 m), weight 150 lb 3.2 oz (68.1 kg), SpO2 99 %.    HEENT: No thrush or ulcers. Resp: Lungs clear bilaterally. Cardio: Regular rate and rhythm. GI: Abdomen is soft.  Tender at the mid upper abdomen.  No hepatomegaly.  No mass. Vascular: No leg edema.   Port-A-Cath without erythema.  Lab Results:  Lab Results  Component Value Date   WBC 3.8 (L) 07/07/2017   HGB 11.8 (L) 07/07/2017   HCT 34.2 (L) 07/07/2017   MCV 87.9 07/07/2017   PLT 191.0 07/07/2017   NEUTROABS 1.8 07/07/2017    Imaging:  No results found.  Medications: I have reviewed the patient's current medications.  Assessment/Plan: 1. 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  Gemcitabine held 11/02/2014 due to neutropenia.  Gemcitabine 11/09/2014 dose reduced 800 mg/m.  Gemcitabine held 11/16/2014 due to neutropenia.  Gemcitabine resumed 11/23/2014 every 2 week schedule.  Cycle 6 gemcitabine 01/05/2015  Cycle 7 gemcitabine 01/18/2015  Cycle 8 gemcitabine 02/01/2015  Cycle 9 gemcitabine 02/15/2015  Cycle 10 gemcitabine  03/01/2015  Cycle 11 gemcitabine 03/15/2015  Cycle 12 gemcitabine 03/29/2015  Elevated CA 19-01 May 2016  CTs 06/17/2016-new soft tissue mass at the root of the mesentery with vascular involvement  PET 06/27/2016-hypermetabolic activity associated with soft tissue adjacent to surgical clips in the central mesentery  Status post SBRTto the mesenteric mass completed 07/26/2016  CT abdomen/pelvis 08/23/2016 -mesenteric mass stable to slightly decreased in size.  Cycle 1 FOLFOX 09/03/2016  Cycle 2 FOLFOX 09/30/2016  Cycle 3 FOLFOX 10/14/2016   Cycle 4 FOLFOX 11/05/2016 (5-FU bolus eliminated and 5-FU pump dose reduced)  Cycle 5 FOLFOX 11/19/2016  CT abdomen/pelvis 11/28/2016-decreased size of soft tissue at the small bowel mesentery, mild asymmetric soft tissue at the left vaginal cuff  Cycle 6 FOLFOX 12/10/2016 (5-FU infusion further reduced and oxaliplatin reduced)  Cycle 7 FOLFOX 12/31/2016  Cycle 8 FOLFOX 01/21/2017   CT 02/24/2017-slight decrease in size of the mesenteric mass, resolution of soft tissue fullness at the left vaginal cuff, no evidence of disease progression  CT abdomen/pelvis 06/20/2017-stable ill-defined soft tissue at the mesenteric root, mild increased asymmetry at the left vaginal cuff, no other evidence of disease progression   2. bile duct obstruction secondary to #1, status post an ERCP with stent placement 09/23/2015hypermetabolic soft tissue in the central mesentery, no other evidence of metastatic disease, stable mildly enlarged portal caval node  3. Admission with post ERCP pancreatitis 06/16/2014  4. History of abdominal pain secondary to #1  5. Pulmonary embolism diagnosed on a CT of the abdomen 09/16/2014  Negative lower extremity Dopplers 09/17/2014  6. Multiple orthopedic surgical procedures  7. Endometrial cancer,stage IA, grade 1 endometrioid  adenocarcinoma, 18% myometrial invasion, no lymphovascular space  involvement, negative washings  Status post robotic total hysterectomy and bilateral salpingo-oophorectomy 11/30/2010  Recurrent tumor left lateral vagina status post biopsy 11/24/2014 with pathology confirming adenocarcinoma with focal squamous differentiation consistent with endometrial adenocarcinoma  Staging CT scans 12/06/2014 with no evidence of local pancreatic cancer recurrence. Small fluid collection adjacent to the left adrenal gland. Severe hepatic steatosis. No evidence of local extension of endometrial carcinoma. Carcinoma not well-defined at the vaginal cuff. 5 mm right external iliac lymph node. 3.6 mm left external iliac lymph node  Brachytherapy initiated 12/22/2014, completed 01/19/2015  CT abdomen/pelvis 07/24/2015 revealed a 3 x 4 cm soft tissue focus at the vaginal apex  PET scan 08/11/2015 revealed no mass at the vaginal apex and no evidence of metastatic disease  CT 11/28/2016-mild asymmetric soft tissue at the left vaginal cuff, resolved on CT 02/24/2017  CT abdomen/pelvis 06/20/2017-stable mild ill-defined soft tissue density in the mesenteric root. Stable mild portacaval lymphadenopathy and subcentimeter right retroperitoneal lymph nodes. Mild increased size of asymmetric soft tissue density involving the left vaginal cuff.  PET scan 07/19/2017-no suspicious hypermetabolic activity within the neck, chest, abdomen or pelvis. Specifically no evidence of residual hypermetabolic tumor in the surgical bed or at the vaginal cuff. Hypermetabolic activity in the lumbar spine at the level of the right L3-4 facet joint appears degenerative.  8. History of atrial fibrillation-maintained on xarelto  9. Family history of multiple cancers-negative CancerNext gene panel  10. Prolonged nausea following the pancreaticoduodenectomy. Improved 10/26/2014.  11. Port-A-Cath placement 10/21/2014.  12. History of Neutropenia secondary to chemotherapy   13. Diarrhea. Question  pancreatic insufficiency. Pancreatic enzyme replacement initiated 01/05/2015. Recurrent diarrhea following a course of antibiotics March 2017.  14. History of positional vertigo-resolved  15. 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 Neutropenia secondary to chemotherapy 09/18/2016.  17. Delayed nausea and diarrhea following FOLFOX. Emend added with cycle 2. Decadron prophylaxis added with cycle 3  18. Diarrhea 10/28/2016. Question related to chemotherapy. Negative C. difficile testing 10/31/2016.  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   Disposition: Ms. Hepworth appears stable.  Abdominal pain continues to be improved since beginning amitriptyline.  There is no clinical evidence of disease progression.  We will follow-up on the CA-19-9 tumor marker from today.  She will return for labs, Port-A-Cath flush and a follow-up visit in 6 weeks.  Patient seen with Dr. [name].    Ned Card ANP/GNP-BC   09/11/2017  10:34 AM This was a shared visit with Ned Card.  [name] to continues to have improved pain while on amitriptyline.  She will return for an office visit in 6 weeks.  Julieanne Manson, MD

## 2017-09-11 NOTE — Telephone Encounter (Signed)
Scheduled appt per 12/20 los - gave patient AVS and calender per los. Lab and f/u in 6 weeks.

## 2017-09-12 ENCOUNTER — Telehealth: Payer: Self-pay

## 2017-09-12 LAB — CANCER ANTIGEN 19-9: CAN 19-9: 90 U/mL — AB (ref 0–35)

## 2017-09-12 NOTE — Telephone Encounter (Signed)
Returned patient call regarding tumor marker. Reported an increase to 90 in value. Dr. [name] aware of labs and instructed patient to keep appointments as scheduled. Patient became emotional and voiced understanding. Reiterated that MD is aware and emotional support provided. Patient voiced understanding.

## 2017-10-23 ENCOUNTER — Inpatient Hospital Stay: Payer: Medicare Other | Attending: Oncology | Admitting: Oncology

## 2017-10-23 ENCOUNTER — Encounter: Payer: Self-pay | Admitting: Oncology

## 2017-10-23 ENCOUNTER — Inpatient Hospital Stay: Payer: Medicare Other

## 2017-10-23 VITALS — BP 132/83 | HR 55 | Temp 97.5°F | Resp 18 | Ht 63.0 in | Wt 156.9 lb

## 2017-10-23 DIAGNOSIS — Z452 Encounter for adjustment and management of vascular access device: Secondary | ICD-10-CM | POA: Insufficient documentation

## 2017-10-23 DIAGNOSIS — Z95828 Presence of other vascular implants and grafts: Secondary | ICD-10-CM

## 2017-10-23 DIAGNOSIS — C250 Malignant neoplasm of head of pancreas: Secondary | ICD-10-CM

## 2017-10-23 DIAGNOSIS — Z7901 Long term (current) use of anticoagulants: Secondary | ICD-10-CM | POA: Diagnosis not present

## 2017-10-23 DIAGNOSIS — C541 Malignant neoplasm of endometrium: Secondary | ICD-10-CM

## 2017-10-23 MED ORDER — HYDROCODONE-ACETAMINOPHEN 5-325 MG PO TABS: 1.0000 | ORAL_TABLET | ORAL | 0 refills | Status: DC | PRN

## 2017-10-23 MED ORDER — AMITRIPTYLINE HCL 25 MG PO TABS: 50.0000 mg | ORAL_TABLET | Freq: Every day | ORAL | 2 refills | Status: DC

## 2017-10-23 MED ORDER — HEPARIN SOD (PORK) LOCK FLUSH 100 UNIT/ML IV SOLN
500.0000 [IU] | Freq: Once | INTRAVENOUS | Status: AC | PRN
  Administered 2017-10-23: 500 [IU] via INTRAVENOUS
  Filled 2017-10-23: qty 5

## 2017-10-23 MED ORDER — MORPHINE SULFATE 15 MG PO TABS: 15.0000 mg | ORAL_TABLET | ORAL | 0 refills | Status: DC | PRN

## 2017-10-23 MED ORDER — MORPHINE SULFATE 15 MG PO TABS
15.0000 mg | ORAL_TABLET | Freq: Once | ORAL | Status: AC
  Administered 2017-10-23: 15 mg via ORAL

## 2017-10-23 MED ORDER — SODIUM CHLORIDE 0.9 % IJ SOLN
10.0000 mL | INTRAMUSCULAR | Status: DC | PRN
  Administered 2017-10-23: 10 mL via INTRAVENOUS
  Filled 2017-10-23: qty 10

## 2017-10-23 NOTE — Progress Notes (Signed)
[name] OFFICE PROGRESS NOTE   Diagnosis: Pancreas cancer  INTERVAL HISTORY:   [name] returns as scheduled.  She has noted increased pain for the past few weeks.  The pain is at the left abdomen and back.  She takes MSIR and amitriptyline at night.  She is not taking pain medicine during the day.  Occasional nausea.  She is having bowel movements.  Good appetite.  Objective:  Vital signs in last 24 hours:  Blood pressure 132/83, pulse (!) 55, temperature (!) 97.5 F (36.4 C), temperature source Oral, resp. rate 18, height 5\' 3"  (1.6 m), weight 156 lb 14.4 oz (71.2 kg), SpO2 100 %.  Resp: Lungs clear bilaterally Cardio: Regular rate and rhythm GI: No hepatomegaly, no mass, tender throughout the mid and upper abdomen Vascular: No leg edema    Portacath/PICC-without erythema  Lab Results:  Lab Results  Component Value Date   WBC 3.8 (L) 07/07/2017   HGB 11.8 (L) 07/07/2017   HCT 34.2 (L) 07/07/2017   MCV 87.9 07/07/2017   PLT 191.0 07/07/2017   NEUTROABS 1.8 07/07/2017    CMP     Component Value Date/Time   NA 138 07/07/2017 1145   NA 138 06/03/2017 0937   K 4.2 07/07/2017 1145   K 4.0 06/03/2017 0937   CL 102 07/07/2017 1145   CO2 28 07/07/2017 1145   CO2 24 06/03/2017 0937   GLUCOSE 102 (H) 07/07/2017 1145   GLUCOSE 111 06/03/2017 0937   BUN 10 07/07/2017 1145   BUN 13.0 06/03/2017 0937   CREATININE 0.66 07/07/2017 1145   CREATININE 0.8 06/03/2017 0937   CALCIUM 9.0 07/07/2017 1145   CALCIUM 9.2 06/03/2017 0937   PROT 6.9 07/07/2017 1145   PROT 6.9 06/03/2017 0937   ALBUMIN 3.9 07/07/2017 1145   ALBUMIN 3.6 06/03/2017 0937   AST 40 (H) 07/07/2017 1145   AST 55 (H) 06/03/2017 0937   ALT 21 07/07/2017 1145   ALT 38 06/03/2017 0937   ALKPHOS 122 (H) 07/07/2017 1145   ALKPHOS 142 06/03/2017 0937   BILITOT 0.7 07/07/2017 1145   BILITOT 0.56 06/03/2017 0937   GFRNONAA >60 01/27/2017 0532   GFRAA >60 01/27/2017 0532   CA 19-9 on  09/11/2018: 90  Medications: I have reviewed the patient's current medications.   Assessment/Plan: 1. 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  Gemcitabine held 11/02/2014 due to neutropenia.  Gemcitabine 11/09/2014 dose reduced 800 mg/m.  Gemcitabine held 11/16/2014 due to neutropenia.  Gemcitabine resumed 11/23/2014 every 2 week schedule.  Cycle 6 gemcitabine 01/05/2015  Cycle 7 gemcitabine 01/18/2015  Cycle 8 gemcitabine 02/01/2015  Cycle 9 gemcitabine 02/15/2015  Cycle 10 gemcitabine 03/01/2015  Cycle 11 gemcitabine 03/15/2015  Cycle 12 gemcitabine 03/29/2015  Elevated CA 19-01 May 2016  CTs 06/17/2016-new soft tissue mass at the root of the mesentery with vascular involvement  PET 06/27/2016-hypermetabolic activity associated with soft tissue adjacent to surgical clips in the central mesentery  Status post SBRTto the mesenteric mass completed 07/26/2016  CT abdomen/pelvis 08/23/2016 -mesenteric mass stable to slightly decreased in size.  Cycle 1 FOLFOX 09/03/2016  Cycle 2 FOLFOX 09/30/2016  Cycle 3 FOLFOX 10/14/2016   Cycle 4 FOLFOX 11/05/2016 (5-FU bolus eliminated and 5-FU pump  dose reduced)  Cycle 5 FOLFOX 11/19/2016  CT abdomen/pelvis 11/28/2016-decreased size of soft tissue at the small bowel mesentery, mild asymmetric soft tissue at the left vaginal cuff  Cycle 6 FOLFOX 12/10/2016 (5-FU infusion further reduced and oxaliplatin reduced)  Cycle 7 FOLFOX 12/31/2016  Cycle 8 FOLFOX 01/21/2017   CT 02/24/2017-slight decrease in size of the mesenteric mass, resolution of soft tissue fullness at the left vaginal  cuff, no evidence of disease progression  CT abdomen/pelvis 06/20/2017-stable ill-defined soft tissue at the mesenteric root, mild increased asymmetry at the left vaginal cuff, no other evidence of disease progression  PET scan 07/19/2017-no suspicious hypermetabolic activity within the neck, chest, abdomen or pelvis. Specifically no evidence of residual hypermetabolic tumor in the surgical bed or at the vaginal cuff. Hypermetabolic activity in the lumbar spine at the level of the right L3-4 facet joint appears degenerative   2. bile duct obstruction secondary to #1, status post an ERCP with stent placement 09/23/2015hypermetabolic soft tissue in the central mesentery, no other evidence of metastatic disease, stable mildly enlarged portal caval node  3. Admission with post ERCP pancreatitis 06/16/2014  4. History of abdominal pain secondary to #1  5. Pulmonary embolism diagnosed on a CT of the abdomen 09/16/2014  Negative lower extremity Dopplers 09/17/2014  6. Multiple orthopedic surgical procedures  7. Endometrial cancer,stage IA, grade 1 endometrioid adenocarcinoma, 18% myometrial invasion, no lymphovascular space involvement, negative washings  Status post robotic total hysterectomy and bilateral salpingo-oophorectomy 11/30/2010  Recurrent tumor left lateral vagina status post biopsy 11/24/2014 with pathology confirming adenocarcinoma with focal squamous differentiation consistent with endometrial adenocarcinoma  Staging CT scans 12/06/2014 with no evidence of local pancreatic cancer recurrence. Small fluid collection adjacent to the left adrenal gland. Severe hepatic steatosis. No evidence of local extension of endometrial carcinoma. Carcinoma not well-defined at the vaginal cuff. 5 mm right external iliac lymph node. 3.6 mm left external iliac lymph node  Brachytherapy initiated 12/22/2014, completed 01/19/2015  CT abdomen/pelvis 07/24/2015 revealed a 3 x 4 cm  soft tissue focus at the vaginal apex  PET scan 08/11/2015 revealed no mass at the vaginal apex and no evidence of metastatic disease  CT 11/28/2016-mild asymmetric soft tissue at the left vaginal cuff, resolved on CT 02/24/2017  CT abdomen/pelvis 06/20/2017-stable mild ill-defined soft tissue density in the mesenteric root. Stable mild portacaval lymphadenopathy and subcentimeter right retroperitoneal lymph nodes. Mild increased size of asymmetric soft tissue density involving the left vaginal cuff.  PET scan 07/19/2017-no suspicious hypermetabolic activity within the neck, chest, abdomen or pelvis. Specifically no evidence of residual hypermetabolic tumor in the surgical bed or at the vaginal cuff. Hypermetabolic activity in the lumbar spine at the level of the right L3-4 facet joint appears degenerative.  8. History of atrial fibrillation-maintained on xarelto  9. Family history of multiple cancers-negative CancerNext gene panel  10. Prolonged nausea following the pancreaticoduodenectomy. Improved 10/26/2014.  11. Port-A-Cath placement 10/21/2014.  12. History of Neutropenia secondary to chemotherapy   13. Diarrhea. Question pancreatic insufficiency. Pancreatic enzyme replacement initiated 01/05/2015. Recurrent diarrhea following a course of antibiotics March 2017.  14. History of positional vertigo-resolved  15. 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 Neutropenia secondary to chemotherapy 09/18/2016.  17. Delayed nausea and diarrhea following FOLFOX. Emend added with cycle 2. Decadron prophylaxis added with cycle 3  18. Diarrhea 10/28/2016. Question related to chemotherapy. Negative C. difficile testing 10/31/2016.  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   Disposition: Ms. Zavadil has increased pain over the past  several weeks.  The pain is most likely related to the mesenteric mass.  She will continue MSIR and hydrocodone as needed.  We increase the amitriptyline dose to 50 mg.  She will contact us if the pain does not improve.  She will return for an office visit in 6 weeks.  We will follow-up on the CA 19-9 from today.  She will be referred for restaging CTs if the CA 19-9 is significantly higher.    Betsy Coder, MD  10/23/2017  10:48 AM

## 2017-10-23 NOTE — Patient Instructions (Signed)
Implanted Port Home Guide An implanted port is a type of central line that is placed under the skin. Central lines are used to provide IV access when treatment or nutrition needs to be given through a person's veins. Implanted ports are used for long-term IV access. An implanted port may be placed because:  You need IV medicine that would be irritating to the small veins in your hands or arms.  You need long-term IV medicines, such as antibiotics.  You need IV nutrition for a long period.  You need frequent blood draws for lab tests.  You need dialysis.  Implanted ports are usually placed in the chest area, but they can also be placed in the upper arm, the abdomen, or the leg. An implanted port has two main parts:  Reservoir. The reservoir is round and will appear as a small, raised area under your skin. The reservoir is the part where a needle is inserted to give medicines or draw blood.  Catheter. The catheter is a thin, flexible tube that extends from the reservoir. The catheter is placed into a large vein. Medicine that is inserted into the reservoir goes into the catheter and then into the vein.  How will I care for my incision site? Do not get the incision site wet. Bathe or shower as directed by your health care provider. How is my port accessed? Special steps must be taken to access the port:  Before the port is accessed, a numbing cream can be placed on the skin. This helps numb the skin over the port site.  Your health care provider uses a sterile technique to access the port. ? Your health care provider must put on a mask and sterile gloves. ? The skin over your port is cleaned carefully with an antiseptic and allowed to dry. ? The port is gently pinched between sterile gloves, and a needle is inserted into the port.  Only "non-coring" port needles should be used to access the port. Once the port is accessed, a blood return should be checked. This helps ensure that the port  is in the vein and is not clogged.  If your port needs to remain accessed for a constant infusion, a clear (transparent) bandage will be placed over the needle site. The bandage and needle will need to be changed every week, or as directed by your health care provider.  Keep the bandage covering the needle clean and dry. Do not get it wet. Follow your health care provider's instructions on how to take a shower or bath while the port is accessed.  If your port does not need to stay accessed, no bandage is needed over the port.  What is flushing? Flushing helps keep the port from getting clogged. Follow your health care provider's instructions on how and when to flush the port. Ports are usually flushed with saline solution or a medicine called heparin. The need for flushing will depend on how the port is used.  If the port is used for intermittent medicines or blood draws, the port will need to be flushed: ? After medicines have been given. ? After blood has been drawn. ? As part of routine maintenance.  If a constant infusion is running, the port may not need to be flushed.  How long will my port stay implanted? The port can stay in for as long as your health care provider thinks it is needed. When it is time for the port to come out, surgery will be   done to remove it. The procedure is similar to the one performed when the port was put in. When should I seek immediate medical care? When you have an implanted port, you should seek immediate medical care if:  You notice a bad smell coming from the incision site.  You have swelling, redness, or drainage at the incision site.  You have more swelling or pain at the port site or the surrounding area.  You have a fever that is not controlled with medicine.  This information is not intended to replace advice given to you by your health care provider. Make sure you discuss any questions you have with your health care provider. Document  Released: 09/09/2005 Document Revised: 02/15/2016 Document Reviewed: 05/17/2013 Elsevier Interactive Patient Education  2017 [employer]vier [employer].  

## 2017-10-24 LAB — CANCER ANTIGEN 19-9: CA 19-9: 175 U/mL — ABNORMAL HIGH (ref 0–35)

## 2017-10-27 ENCOUNTER — Encounter: Payer: Self-pay | Admitting: Oncology

## 2017-10-27 ENCOUNTER — Telehealth: Payer: Self-pay | Admitting: Emergency Medicine

## 2017-10-27 ENCOUNTER — Other Ambulatory Visit: Payer: Self-pay | Admitting: Nurse Practitioner

## 2017-10-27 ENCOUNTER — Other Ambulatory Visit: Payer: Self-pay | Admitting: Emergency Medicine

## 2017-10-27 DIAGNOSIS — C250 Malignant neoplasm of head of pancreas: Secondary | ICD-10-CM

## 2017-10-27 NOTE — Telephone Encounter (Addendum)
CT scheduled for 3/12 @ 10 am. Labs and flush needed before scan. Scheduling message sent. PT given times for labs at 8:30 on 3/12. Pt instructions given for oral contrast. Pt verbalized understanding.   ----- Message from Ladell Pier, MD sent at 10/24/2017  6:45 AM EST ----- Please call patient, ca19-9 is higher, scheduled CT abd/pelvis with contrast few days prior to next visit

## 2017-10-31 ENCOUNTER — Telehealth: Payer: Self-pay | Admitting: *Deleted

## 2017-10-31 NOTE — Telephone Encounter (Signed)
Call from pt reporting she saw Dr. Altamease Oiler on 2/7, MD recommends she proceeds with scans here. Pt is scheduled to F/U with [facility] on 2/14. Pt requests to r/s appt here a few days after that visit. Message to schedulers.

## 2017-11-10 ENCOUNTER — Encounter: Payer: Self-pay | Admitting: Oncology

## 2017-11-12 ENCOUNTER — Telehealth: Payer: Self-pay | Admitting: Emergency Medicine

## 2017-11-12 ENCOUNTER — Telehealth: Payer: Self-pay

## 2017-11-12 NOTE — Telephone Encounter (Signed)
Spoke with pt to clarify about appt dates. "I want to move them up to as soon as available, I've been in pain the last couple of days". Spoke with Dr. [name] and OK to move appts up.

## 2017-11-12 NOTE — Telephone Encounter (Signed)
PT requested scans and next appt w/ Dr.Sherrill be moved up to next week. Dr. [name] is ok with seeing the patient on 2/27 @ 10am. Scans/ lab/ flush moved to 2/26. Pt is aware of changes. Instructions given for scan. Pt verbalized understanding of this. Scheduling message sent.

## 2017-11-18 ENCOUNTER — Inpatient Hospital Stay: Payer: Medicare Other | Attending: Oncology

## 2017-11-18 ENCOUNTER — Inpatient Hospital Stay: Payer: Medicare Other

## 2017-11-18 ENCOUNTER — Encounter (HOSPITAL_COMMUNITY): Payer: Self-pay

## 2017-11-18 ENCOUNTER — Ambulatory Visit (HOSPITAL_COMMUNITY)
Admission: RE | Admit: 2017-11-18 | Discharge: 2017-11-18 | Disposition: A | Discharge status: Home / Self Care | Payer: Medicare Other | Source: Ambulatory Visit | Attending: Nurse Practitioner | Admitting: Nurse Practitioner

## 2017-11-18 DIAGNOSIS — Z8507 Personal history of malignant neoplasm of pancreas: Secondary | ICD-10-CM | POA: Insufficient documentation

## 2017-11-18 DIAGNOSIS — Z95828 Presence of other vascular implants and grafts: Secondary | ICD-10-CM

## 2017-11-18 DIAGNOSIS — R109 Unspecified abdominal pain: Secondary | ICD-10-CM | POA: Diagnosis not present

## 2017-11-18 DIAGNOSIS — C541 Malignant neoplasm of endometrium: Secondary | ICD-10-CM

## 2017-11-18 DIAGNOSIS — Z7901 Long term (current) use of anticoagulants: Secondary | ICD-10-CM | POA: Diagnosis not present

## 2017-11-18 DIAGNOSIS — Z452 Encounter for adjustment and management of vascular access device: Secondary | ICD-10-CM | POA: Insufficient documentation

## 2017-11-18 DIAGNOSIS — C250 Malignant neoplasm of head of pancreas: Secondary | ICD-10-CM | POA: Diagnosis not present

## 2017-11-18 DIAGNOSIS — I4891 Unspecified atrial fibrillation: Secondary | ICD-10-CM | POA: Diagnosis not present

## 2017-11-18 DIAGNOSIS — Z8542 Personal history of malignant neoplasm of other parts of uterus: Secondary | ICD-10-CM | POA: Insufficient documentation

## 2017-11-18 LAB — CMP (CANCER CENTER ONLY)
ALK PHOS: 574 U/L — AB (ref 40–150)
ALT: 79 U/L — AB (ref 0–55)
AST: 125 U/L — AB (ref 5–34)
Albumin: 3.3 g/dL — ABNORMAL LOW (ref 3.5–5.0)
Anion gap: 11 (ref 3–11)
BILIRUBIN TOTAL: 1.1 mg/dL (ref 0.2–1.2)
BUN: 6 mg/dL — AB (ref 7–26)
CALCIUM: 9.2 mg/dL (ref 8.4–10.4)
CO2: 24 mmol/L (ref 22–29)
CREATININE: 0.65 mg/dL (ref 0.60–1.10)
Chloride: 102 mmol/L (ref 98–109)
GFR, Est AFR Am: >60 mL/min (ref >60)
GLUCOSE: 111 mg/dL (ref 70–140)
POTASSIUM: 3.6 mmol/L (ref 3.5–5.1)
Sodium: 137 mmol/L (ref 136–145)
TOTAL PROTEIN: 7.1 g/dL (ref 6.4–8.3)

## 2017-11-18 MED ORDER — HEPARIN SOD (PORK) LOCK FLUSH 100 UNIT/ML IV SOLN
INTRAVENOUS | Status: AC
  Filled 2017-11-18: qty 5

## 2017-11-18 MED ORDER — SODIUM CHLORIDE 0.9 % IJ SOLN
INTRAMUSCULAR | Status: AC
  Filled 2017-11-18: qty 50

## 2017-11-18 MED ORDER — IOPAMIDOL (ISOVUE-300) INJECTION 61%
100.0000 mL | Freq: Once | INTRAVENOUS | Status: AC | PRN
  Administered 2017-11-18: 100 mL via INTRAVENOUS

## 2017-11-18 MED ORDER — HEPARIN SOD (PORK) LOCK FLUSH 100 UNIT/ML IV SOLN
500.0000 [IU] | Freq: Once | INTRAVENOUS | Status: AC
  Administered 2017-11-18: 500 [IU] via INTRAVENOUS

## 2017-11-18 MED ORDER — SODIUM CHLORIDE 0.9 % IJ SOLN
10.0000 mL | INTRAMUSCULAR | Status: DC | PRN
  Administered 2017-11-18: 10 mL via INTRAVENOUS
  Filled 2017-11-18: qty 10

## 2017-11-18 MED ORDER — IOPAMIDOL (ISOVUE-300) INJECTION 61%
INTRAVENOUS | Status: AC
  Filled 2017-11-18: qty 100

## 2017-11-18 NOTE — Patient Instructions (Signed)
Implanted Port Home Guide An implanted port is a type of central line that is placed under the skin. Central lines are used to provide IV access when treatment or nutrition needs to be given through a person's veins. Implanted ports are used for long-term IV access. An implanted port may be placed because:  You need IV medicine that would be irritating to the small veins in your hands or arms.  You need long-term IV medicines, such as antibiotics.  You need IV nutrition for a long period.  You need frequent blood draws for lab tests.  You need dialysis.  Implanted ports are usually placed in the chest area, but they can also be placed in the upper arm, the abdomen, or the leg. An implanted port has two main parts:  Reservoir. The reservoir is round and will appear as a small, raised area under your skin. The reservoir is the part where a needle is inserted to give medicines or draw blood.  Catheter. The catheter is a thin, flexible tube that extends from the reservoir. The catheter is placed into a large vein. Medicine that is inserted into the reservoir goes into the catheter and then into the vein.  How will I care for my incision site? Do not get the incision site wet. Bathe or shower as directed by your health care provider. How is my port accessed? Special steps must be taken to access the port:  Before the port is accessed, a numbing cream can be placed on the skin. This helps numb the skin over the port site.  Your health care provider uses a sterile technique to access the port. ? Your health care provider must put on a mask and sterile gloves. ? The skin over your port is cleaned carefully with an antiseptic and allowed to dry. ? The port is gently pinched between sterile gloves, and a needle is inserted into the port.  Only "non-coring" port needles should be used to access the port. Once the port is accessed, a blood return should be checked. This helps ensure that the port  is in the vein and is not clogged.  If your port needs to remain accessed for a constant infusion, a clear (transparent) bandage will be placed over the needle site. The bandage and needle will need to be changed every week, or as directed by your health care provider.  Keep the bandage covering the needle clean and dry. Do not get it wet. Follow your health care provider's instructions on how to take a shower or bath while the port is accessed.  If your port does not need to stay accessed, no bandage is needed over the port.  What is flushing? Flushing helps keep the port from getting clogged. Follow your health care provider's instructions on how and when to flush the port. Ports are usually flushed with saline solution or a medicine called heparin. The need for flushing will depend on how the port is used.  If the port is used for intermittent medicines or blood draws, the port will need to be flushed: ? After medicines have been given. ? After blood has been drawn. ? As part of routine maintenance.  If a constant infusion is running, the port may not need to be flushed.  How long will my port stay implanted? The port can stay in for as long as your health care provider thinks it is needed. When it is time for the port to come out, surgery will be   done to remove it. The procedure is similar to the one performed when the port was put in. When should I seek immediate medical care? When you have an implanted port, you should seek immediate medical care if:  You notice a bad smell coming from the incision site.  You have swelling, redness, or drainage at the incision site.  You have more swelling or pain at the port site or the surrounding area.  You have a fever that is not controlled with medicine.  This information is not intended to replace advice given to you by your health care provider. Make sure you discuss any questions you have with your health care provider. Document  Released: 09/09/2005 Document Revised: 02/15/2016 Document Reviewed: 05/17/2013 Elsevier Interactive Patient Education  2017 [employer]vier [employer].  

## 2017-11-19 ENCOUNTER — Inpatient Hospital Stay: Payer: Medicare Other | Admitting: Oncology

## 2017-11-19 ENCOUNTER — Telehealth: Payer: Self-pay | Admitting: Oncology

## 2017-11-19 ENCOUNTER — Telehealth: Payer: Self-pay

## 2017-11-19 DIAGNOSIS — I4891 Unspecified atrial fibrillation: Secondary | ICD-10-CM | POA: Diagnosis not present

## 2017-11-19 DIAGNOSIS — R109 Unspecified abdominal pain: Secondary | ICD-10-CM | POA: Diagnosis not present

## 2017-11-19 DIAGNOSIS — Z7901 Long term (current) use of anticoagulants: Secondary | ICD-10-CM | POA: Diagnosis not present

## 2017-11-19 DIAGNOSIS — Z8507 Personal history of malignant neoplasm of pancreas: Secondary | ICD-10-CM

## 2017-11-19 DIAGNOSIS — Z8542 Personal history of malignant neoplasm of other parts of uterus: Secondary | ICD-10-CM

## 2017-11-19 DIAGNOSIS — C250 Malignant neoplasm of head of pancreas: Secondary | ICD-10-CM

## 2017-11-19 LAB — CANCER ANTIGEN 19-9: CA 19-9: 316 U/mL — ABNORMAL HIGH (ref 0–35)

## 2017-11-19 MED ORDER — MORPHINE SULFATE 15 MG PO TABS: 15.0000 mg | ORAL_TABLET | ORAL | 0 refills | Status: DC | PRN

## 2017-11-19 MED ORDER — HYDROCODONE-ACETAMINOPHEN 5-325 MG PO TABS: 1.0000 | ORAL_TABLET | ORAL | 0 refills | Status: DC | PRN

## 2017-11-19 NOTE — Progress Notes (Signed)
[facility] OFFICE PROGRESS NOTE   Diagnosis: Pancreas cancer  INTERVAL HISTORY:   [name] continues to have pain, chiefly in the left abdomen.  The pain is partially relieved with hydrocodone and MSIR.  She is taking amitriptyline at night.  She saw Dr. Altamease Oiler 10/30/2017 for a second opinion.  She recommends molecular testing.     Objective:  Vital signs in last 24 hours:  Blood pressure 118/69, pulse 60, temperature 97.8 F (36.6 C), temperature source Oral, resp. rate 18, height 5\' 3"  (1.6 m), weight 153 lb 1.6 oz (69.4 kg), SpO2 100 %.   Resp: End inspiratory wheeze at the upper posterior chest bilaterally, no respiratory distress Cardio: Irregular GI: No hepatomegaly, no mass, tender in the left upper abdomen Vascular: No leg edema    Portacath/PICC-without erythema  Lab Results:  Lab Results  Component Value Date   WBC 3.8 (L) 07/07/2017   HGB 11.8 (L) 07/07/2017   HCT 34.2 (L) 07/07/2017   MCV 87.9 07/07/2017   PLT 191.0 07/07/2017   NEUTROABS 1.8 07/07/2017    CMP     Component Value Date/Time   NA 137 11/18/2017 0850   NA 138 06/03/2017 0937   K 3.6 11/18/2017 0850   K 4.0 06/03/2017 0937   CL 102 11/18/2017 0850   CO2 24 11/18/2017 0850   CO2 24 06/03/2017 0937   GLUCOSE 111 11/18/2017 0850   GLUCOSE 111 06/03/2017 0937   BUN 6 (L) 11/18/2017 0850   BUN 13.0 06/03/2017 0937   CREATININE 0.65 11/18/2017 0850   CREATININE 0.8 06/03/2017 0937   CALCIUM 9.2 11/18/2017 0850   CALCIUM 9.2 06/03/2017 0937   PROT 7.1 11/18/2017 0850   PROT 6.9 06/03/2017 0937   ALBUMIN 3.3 (L) 11/18/2017 0850   ALBUMIN 3.6 06/03/2017 0937   AST 125 (H) 11/18/2017 0850   AST 55 (H) 06/03/2017 0937   ALT 79 (H) 11/18/2017 0850   ALT 38 06/03/2017 0937   ALKPHOS 574 (H) 11/18/2017 0850   ALKPHOS 142 06/03/2017 0937   BILITOT 1.1 11/18/2017 0850   BILITOT 0.56 06/03/2017 0937   GFRNONAA >60 11/18/2017 0850   GFRAA >60 11/18/2017 0850    No  results found for: CEA1  Lab Results  Component Value Date   INR 1.20 08/31/2014    Imaging:  Ct Abdomen Pelvis W Contrast  Result Date: 11/18/2017 CLINICAL DATA:  Restaging pancreatic cancer. History of Whipple procedure. EXAM: CT ABDOMEN AND PELVIS WITH CONTRAST TECHNIQUE: Multidetector CT imaging of the abdomen and pelvis was performed using the standard protocol following bolus administration of intravenous contrast. CONTRAST:  115mL ISOVUE-300 IOPAMIDOL (ISOVUE-300) INJECTION 61% COMPARISON:  PET-CT 07/19/2017 and abdominal CT scan 06/19/2017 FINDINGS: Lower chest: The lung bases are clear of acute process. No pleural effusion or pulmonary lesions. The heart is normal in size. No pericardial effusion. The distal esophagus and aorta are unremarkable. Hepatobiliary: Diffuse fatty infiltration of the liver but no focal hepatic lesions to suggest metastatic disease. Slight progression intrahepatic biliary dilatation. There is also dilatation of the blind-ending jejunal loop of small bowel with slightly prominent mucosal folds. This could be contributing to the intrahepatic biliary dilatation. 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 Pancreas: Surgical changes from partial pancreatectomy and Whipple procedure. The remaining pancreatic body and tail are markedly atrophied and the duct is mildly dilated. The duct may be slightly more dilated than on the prior CT Spleen: Normal size.  No focal lesions. Adrenals/Urinary Tract: The adrenal glands and kidneys are  unremarkable. The bladder is grossly normal. Stomach/Bowel: Surgical changes from a Whipple procedure. Gastrojejunostomy appears unremarkable. Again demonstrated is a dilated afferent loop and some wall thickening near the anastomoses. The bowel loop narrow significantly as it crosses the midline. Could not exclude afferent loop syndrome. The remaining small bowel and colon are unremarkable except for moderate to large amount of stool throughout the colon  which suggests constipation. Vascular/Lymphatic: The aorta branch vessels are patent. The branch vessels are patent. The major venous structures are patent. No mesenteric or retroperitoneal mass or adenopathy. Small scattered lymph nodes are unchanged. Reproductive: Stable fullness in the left vaginal cuff area but this was not hot on the PET scan and is likely normal postoperative change. Other: No pelvic mass or adenopathy. No free pelvic fluid collections. No inguinal mass or adenopathy. Musculoskeletal: No significant bony findings. IMPRESSION: 1. Surgical changes from a partial pancreatectomy and Whipple procedure. No findings suspicious for recurrent tumor or metastatic disease. 2. Dilated blind-ending afferent loop along with intrahepatic biliary dilatation and slightly progressive pancreatic ductal dilatation. Could not exclude afferent loop syndrome or possible partial obstruction of the jejunal loop. 3. Moderate to large amount of stool throughout the colon suggesting constipation. Electronically Signed   By: Marijo Sanes M.D.   On: 11/18/2017 14:46    Medications: I have reviewed the patient's current medications.   Assessment/Plan: 1. 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  Gemcitabine held 11/02/2014 due to neutropenia.  Gemcitabine 11/09/2014 dose reduced 800 mg/m.  Gemcitabine held 11/16/2014 due to neutropenia.  Gemcitabine resumed 11/23/2014 every 2 week schedule.  Cycle 6 gemcitabine 01/05/2015  Cycle 7 gemcitabine 01/18/2015  Cycle 8 gemcitabine 02/01/2015  Cycle 9 gemcitabine 02/15/2015  Cycle 10 gemcitabine 03/01/2015  Cycle 11  gemcitabine 03/15/2015  Cycle 12 gemcitabine 03/29/2015  Elevated CA 19-01 May 2016  CTs 06/17/2016-new soft tissue mass at the root of the mesentery with vascular involvement  PET 06/27/2016-hypermetabolic activity associated with soft tissue adjacent to surgical clips in the central mesentery  Status post SBRTto the mesenteric mass completed 07/26/2016  CT abdomen/pelvis 08/23/2016 -mesenteric mass stable to slightly decreased in size.  Cycle 1 FOLFOX 09/03/2016  Cycle 2 FOLFOX 09/30/2016  Cycle 3 FOLFOX 10/14/2016   Cycle 4 FOLFOX 11/05/2016 (5-FU bolus eliminated and 5-FU pump dose reduced)  Cycle 5 FOLFOX 11/19/2016  CT abdomen/pelvis 11/28/2016-decreased size of soft tissue at the small bowel mesentery, mild asymmetric soft tissue at the left vaginal cuff  Cycle 6 FOLFOX 12/10/2016 (5-FU infusion further reduced and oxaliplatin reduced)  Cycle 7 FOLFOX 12/31/2016  Cycle 8 FOLFOX 01/21/2017   CT 02/24/2017-slight decrease in size of the mesenteric mass, resolution of soft tissue fullness at the left vaginal cuff, no evidence of disease progression  CT abdomen/pelvis 06/20/2017-stable ill-defined soft tissue at the mesenteric root, mild increased asymmetry at the left vaginal cuff, no other evidence of disease progression  Elevated CA 19-9 09/11/2018  CT abdomen/pelvis 11/18/2017- no evidence of recurrent pancreas cancer, dilated afferent loop, intrahepatic biliary dilatation   2. bile duct obstruction secondary to #1, status post an ERCP with stent placement 09/23/2015hypermetabolic soft tissue in the central mesentery, no other evidence of metastatic disease, stable mildly enlarged portal caval node  3. Admission with  post ERCP pancreatitis 06/16/2014  4. History of abdominal pain secondary to #1  5. Pulmonary embolism diagnosed on a CT of the abdomen 09/16/2014  Negative lower extremity Dopplers 09/17/2014  6. Multiple orthopedic surgical  procedures  7. Endometrial cancer,stage IA, grade 1 endometrioid adenocarcinoma, 18% myometrial invasion, no lymphovascular space involvement, negative washings  Status post robotic total hysterectomy and bilateral salpingo-oophorectomy 11/30/2010  Recurrent tumor left lateral vagina status post biopsy 11/24/2014 with pathology confirming adenocarcinoma with focal squamous differentiation consistent with endometrial adenocarcinoma  Staging CT scans 12/06/2014 with no evidence of local pancreatic cancer recurrence. Small fluid collection adjacent to the left adrenal gland. Severe hepatic steatosis. No evidence of local extension of endometrial carcinoma. Carcinoma not well-defined at the vaginal cuff. 5 mm right external iliac lymph node. 3.6 mm left external iliac lymph node  Brachytherapy initiated 12/22/2014, completed 01/19/2015  CT abdomen/pelvis 07/24/2015 revealed a 3 x 4 cm soft tissue focus at the vaginal apex  PET scan 08/11/2015 revealed no mass at the vaginal apex and no evidence of metastatic disease  CT 11/28/2016-mild asymmetric soft tissue at the left vaginal cuff, resolved on CT 02/24/2017  CT abdomen/pelvis 06/20/2017-stable mild ill-defined soft tissue density in the mesenteric root. Stable mild portacaval lymphadenopathy and subcentimeter right retroperitoneal lymph nodes. Mild increased size of asymmetric soft tissue density involving the left vaginal cuff.  PET scan 07/19/2017-no suspicious hypermetabolic activity within the neck, chest, abdomen or pelvis. Specifically no evidence of residual hypermetabolic tumor in the surgical bed or at the vaginal cuff. Hypermetabolic activity in the lumbar spine at the level of the right L3-4 facet joint appears degenerative.  8. History of atrial fibrillation-maintained on xarelto  9. Family history of multiple cancers-negative CancerNext gene panel  10. Prolonged nausea following the pancreaticoduodenectomy. Improved  10/26/2014.  11. Port-A-Cath placement 10/21/2014.  12. History of Neutropenia secondary to chemotherapy   13. Diarrhea. Question pancreatic insufficiency. Pancreatic enzyme replacement initiated 01/05/2015. Recurrent diarrhea following a course of antibiotics March 2017.  14. History of positional vertigo-resolved  15. 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 Neutropenia secondary to chemotherapy 09/18/2016.  17. Delayed nausea and diarrhea following FOLFOX. Emend added with cycle 2. Decadron prophylaxis added with cycle 3  18. Diarrhea 10/28/2016. Question related to chemotherapy. Negative C. difficile testing 10/31/2016.  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   Disposition: [name] continues to have abdominal pain.  The etiology of the pain is unclear.  I remain concerned she has recurrent pancreas cancer that has not been recognized on repeat imaging studies.  The liver enzymes are mildly elevated.  The significance of this and the CT findings are unclear.  I will contact Dr. [name] to see whether she could benefit from a GI evaluation.  [name] will continue hydrocodone and MSIR for pain.  She will increase the amitriptyline dose to 50 mg each night.  She will return for an office visit as scheduled on 12/08/2017.  25 minutes were spent with the patient today.  The majority of the time was used for counseling and coordination of care.  Betsy Coder, MD  11/19/2017  2:11 PM

## 2017-11-19 NOTE — Telephone Encounter (Signed)
Only printed avs and calender of upcoming appointments. Per 2/27 los ([place]) completed los.

## 2017-11-19 NOTE — Telephone Encounter (Signed)
Scheduled appt per 2/27 los -

## 2017-11-20 ENCOUNTER — Telehealth: Payer: Self-pay

## 2017-11-20 NOTE — Telephone Encounter (Signed)
-----   Message from Milus Banister, MD sent at 11/20/2017  8:59 AM EST ----- I think she should see [place].  I'll expedite.  Thanks   Lovenia Debruler, She needs ROV with me or extender in the next 1-2 weeks. (double book with me if possible).  Thanks  ----- Message ----- From: Ladell Pier, MD Sent: 11/19/2017   2:43 PM To: Milus Banister, MD  Pancreas cancer, she continues to have abdominal pain, celiac block in October.  No recurrent cancer seen on repeat imaging, but the CA 19-9 is rising  Please look at the CT from 11/18/2017.  The liver enzymes are elevated and there is increased biliary dilatation.  Do you think she should have additional imaging or a GI evaluation?  Thanks,  [employer]

## 2017-11-20 NOTE — Telephone Encounter (Signed)
3/15 at 2:15 pm [name] f/u the pt has been advised. She was told to go to the ED for eval if pain is severe

## 2017-11-26 ENCOUNTER — Encounter: Payer: Self-pay | Admitting: Oncology

## 2017-11-26 NOTE — Telephone Encounter (Signed)
Reviewed pt's call with Dr. [name]: Not likely related to her cancer. Any redness or pain at port? Called pt, she denies issues with port. Informed her Dr. [name] recommends she see her PCP to evaluate fevers.

## 2017-11-26 NOTE — Telephone Encounter (Signed)
Called pt, she reports temp 99.9 today. She is taking Tylenol every morning and every evening. Pt reports temp of 102.2 overnight. She is on Amoxicillin, prescribed by PCP for cold symptoms and sore throat. Pt reports she is having sweats to the point where she has to change clothes. Pt also reports intermittent dark orange urine. Began having diarrhea today Instructed her to contact PCP.  Message to Dr. [name] for review.

## 2017-12-02 ENCOUNTER — Emergency Department (HOSPITAL_COMMUNITY): Payer: Medicare Other

## 2017-12-02 ENCOUNTER — Other Ambulatory Visit: Payer: Medicare Other

## 2017-12-02 ENCOUNTER — Ambulatory Visit (HOSPITAL_COMMUNITY): Payer: Medicare Other

## 2017-12-02 ENCOUNTER — Inpatient Hospital Stay: Payer: Medicare Other | Admitting: Medical

## 2017-12-02 ENCOUNTER — Encounter (HOSPITAL_COMMUNITY): Payer: Self-pay

## 2017-12-02 ENCOUNTER — Other Ambulatory Visit: Payer: Self-pay

## 2017-12-02 ENCOUNTER — Inpatient Hospital Stay: Payer: Medicare Other | Attending: Oncology | Admitting: Medical

## 2017-12-02 ENCOUNTER — Inpatient Hospital Stay (HOSPITAL_COMMUNITY)
Admission: EM | Admit: 2017-12-02 | Discharge: 2017-12-09 | DRG: 871 | Disposition: A | Discharge status: Home / Self Care | Payer: Medicare Other | Attending: Internal Medicine | Admitting: Internal Medicine

## 2017-12-02 ENCOUNTER — Inpatient Hospital Stay: Payer: Medicare Other

## 2017-12-02 ENCOUNTER — Inpatient Hospital Stay (HOSPITAL_COMMUNITY): Payer: Medicare Other

## 2017-12-02 ENCOUNTER — Telehealth: Payer: Self-pay

## 2017-12-02 VITALS — BP 107/71 | HR 84 | Temp 98.7°F | Resp 20 | Ht 63.0 in | Wt 154.2 lb

## 2017-12-02 DIAGNOSIS — R109 Unspecified abdominal pain: Secondary | ICD-10-CM | POA: Diagnosis not present

## 2017-12-02 DIAGNOSIS — R1012 Left upper quadrant pain: Secondary | ICD-10-CM | POA: Diagnosis present

## 2017-12-02 DIAGNOSIS — R0600 Dyspnea, unspecified: Secondary | ICD-10-CM | POA: Insufficient documentation

## 2017-12-02 DIAGNOSIS — Z888 Allergy status to other drugs, medicaments and biological substances status: Secondary | ICD-10-CM

## 2017-12-02 DIAGNOSIS — I10 Essential (primary) hypertension: Secondary | ICD-10-CM | POA: Diagnosis present

## 2017-12-02 DIAGNOSIS — A4159 Other Gram-negative sepsis: Secondary | ICD-10-CM | POA: Diagnosis present

## 2017-12-02 DIAGNOSIS — R509 Fever, unspecified: Secondary | ICD-10-CM

## 2017-12-02 DIAGNOSIS — Z8041 Family history of malignant neoplasm of ovary: Secondary | ICD-10-CM

## 2017-12-02 DIAGNOSIS — Z8542 Personal history of malignant neoplasm of other parts of uterus: Secondary | ICD-10-CM

## 2017-12-02 DIAGNOSIS — T451X5S Adverse effect of antineoplastic and immunosuppressive drugs, sequela: Secondary | ICD-10-CM

## 2017-12-02 DIAGNOSIS — Z882 Allergy status to sulfonamides status: Secondary | ICD-10-CM

## 2017-12-02 DIAGNOSIS — Z90411 Acquired partial absence of pancreas: Secondary | ICD-10-CM

## 2017-12-02 DIAGNOSIS — Z9079 Acquired absence of other genital organ(s): Secondary | ICD-10-CM

## 2017-12-02 DIAGNOSIS — K760 Fatty (change of) liver, not elsewhere classified: Secondary | ICD-10-CM | POA: Diagnosis present

## 2017-12-02 DIAGNOSIS — Z800 Family history of malignant neoplasm of digestive organs: Secondary | ICD-10-CM

## 2017-12-02 DIAGNOSIS — D630 Anemia in neoplastic disease: Secondary | ICD-10-CM | POA: Diagnosis present

## 2017-12-02 DIAGNOSIS — Z86711 Personal history of pulmonary embolism: Secondary | ICD-10-CM

## 2017-12-02 DIAGNOSIS — Z884 Allergy status to anesthetic agent status: Secondary | ICD-10-CM

## 2017-12-02 DIAGNOSIS — G620 Drug-induced polyneuropathy: Secondary | ICD-10-CM | POA: Diagnosis present

## 2017-12-02 DIAGNOSIS — I959 Hypotension, unspecified: Secondary | ICD-10-CM | POA: Diagnosis present

## 2017-12-02 DIAGNOSIS — E876 Hypokalemia: Secondary | ICD-10-CM | POA: Diagnosis present

## 2017-12-02 DIAGNOSIS — Z7901 Long term (current) use of anticoagulants: Secondary | ICD-10-CM | POA: Diagnosis not present

## 2017-12-02 DIAGNOSIS — R1013 Epigastric pain: Secondary | ICD-10-CM | POA: Diagnosis not present

## 2017-12-02 DIAGNOSIS — Z90722 Acquired absence of ovaries, bilateral: Secondary | ICD-10-CM

## 2017-12-02 DIAGNOSIS — Z980 Intestinal bypass and anastomosis status: Secondary | ICD-10-CM | POA: Diagnosis not present

## 2017-12-02 DIAGNOSIS — K8309 Other cholangitis: Secondary | ICD-10-CM | POA: Diagnosis present

## 2017-12-02 DIAGNOSIS — Z96653 Presence of artificial knee joint, bilateral: Secondary | ICD-10-CM | POA: Diagnosis present

## 2017-12-02 DIAGNOSIS — Z808 Family history of malignant neoplasm of other organs or systems: Secondary | ICD-10-CM

## 2017-12-02 DIAGNOSIS — Z66 Do not resuscitate: Secondary | ICD-10-CM | POA: Diagnosis present

## 2017-12-02 DIAGNOSIS — Z96612 Presence of left artificial shoulder joint: Secondary | ICD-10-CM | POA: Diagnosis present

## 2017-12-02 DIAGNOSIS — C250 Malignant neoplasm of head of pancreas: Secondary | ICD-10-CM | POA: Diagnosis present

## 2017-12-02 DIAGNOSIS — K859 Acute pancreatitis without necrosis or infection, unspecified: Secondary | ICD-10-CM

## 2017-12-02 DIAGNOSIS — C259 Malignant neoplasm of pancreas, unspecified: Secondary | ICD-10-CM

## 2017-12-02 DIAGNOSIS — K9189 Other postprocedural complications and disorders of digestive system: Secondary | ICD-10-CM

## 2017-12-02 DIAGNOSIS — D649 Anemia, unspecified: Secondary | ICD-10-CM | POA: Diagnosis present

## 2017-12-02 DIAGNOSIS — I482 Chronic atrial fibrillation, unspecified: Secondary | ICD-10-CM | POA: Diagnosis present

## 2017-12-02 DIAGNOSIS — Z803 Family history of malignant neoplasm of breast: Secondary | ICD-10-CM

## 2017-12-02 DIAGNOSIS — R945 Abnormal results of liver function studies: Secondary | ICD-10-CM

## 2017-12-02 DIAGNOSIS — R935 Abnormal findings on diagnostic imaging of other abdominal regions, including retroperitoneum: Secondary | ICD-10-CM

## 2017-12-02 DIAGNOSIS — Z9049 Acquired absence of other specified parts of digestive tract: Secondary | ICD-10-CM

## 2017-12-02 DIAGNOSIS — R1011 Right upper quadrant pain: Secondary | ICD-10-CM | POA: Insufficient documentation

## 2017-12-02 DIAGNOSIS — E871 Hypo-osmolality and hyponatremia: Secondary | ICD-10-CM | POA: Diagnosis present

## 2017-12-02 DIAGNOSIS — A419 Sepsis, unspecified organism: Secondary | ICD-10-CM | POA: Diagnosis not present

## 2017-12-02 DIAGNOSIS — Z923 Personal history of irradiation: Secondary | ICD-10-CM

## 2017-12-02 DIAGNOSIS — E861 Hypovolemia: Secondary | ICD-10-CM | POA: Diagnosis present

## 2017-12-02 DIAGNOSIS — K219 Gastro-esophageal reflux disease without esophagitis: Secondary | ICD-10-CM | POA: Diagnosis present

## 2017-12-02 DIAGNOSIS — I480 Paroxysmal atrial fibrillation: Secondary | ICD-10-CM | POA: Diagnosis present

## 2017-12-02 DIAGNOSIS — Z90710 Acquired absence of both cervix and uterus: Secondary | ICD-10-CM

## 2017-12-02 DIAGNOSIS — K831 Obstruction of bile duct: Secondary | ICD-10-CM | POA: Diagnosis present

## 2017-12-02 DIAGNOSIS — Z9221 Personal history of antineoplastic chemotherapy: Secondary | ICD-10-CM

## 2017-12-02 LAB — URINALYSIS, COMPLETE (UACMP) WITH MICROSCOPIC
BACTERIA UA: NONE SEEN
BILIRUBIN URINE: NEGATIVE
GLUCOSE, UA: NEGATIVE mg/dL
HGB URINE DIPSTICK: NEGATIVE
KETONES UR: NEGATIVE mg/dL
LEUKOCYTES UA: NEGATIVE
NITRITE: NEGATIVE
Protein, ur: NEGATIVE mg/dL
Specific Gravity, Urine: 1.009 (ref 1.005–1.030)
pH: 6 (ref 5.0–8.0)

## 2017-12-02 LAB — URINALYSIS, ROUTINE W REFLEX MICROSCOPIC
BILIRUBIN URINE: NEGATIVE
Glucose, UA: NEGATIVE mg/dL
Hgb urine dipstick: NEGATIVE
Ketones, ur: NEGATIVE mg/dL
Leukocytes, UA: NEGATIVE
NITRITE: NEGATIVE
PH: 6 (ref 5.0–8.0)
Protein, ur: NEGATIVE mg/dL
Specific Gravity, Urine: 1.01 (ref 1.005–1.030)

## 2017-12-02 LAB — CBC WITH DIFFERENTIAL (CANCER CENTER ONLY)
Basophils Absolute: 0 10*3/uL (ref 0.0–0.1)
Basophils Relative: 0 %
EOS ABS: 0.1 10*3/uL (ref 0.0–0.5)
Eosinophils Relative: 1 %
HCT: 31.6 % — ABNORMAL LOW (ref 34.8–46.6)
HEMOGLOBIN: 10.6 g/dL — AB (ref 11.6–15.9)
LYMPHS ABS: 0.6 10*3/uL — AB (ref 0.9–3.3)
LYMPHS PCT: 6 %
MCH: 29.5 pg (ref 25.1–34.0)
MCHC: 33.5 g/dL (ref 31.5–36.0)
MCV: 88 fL (ref 79.5–101.0)
Monocytes Absolute: 0.5 10*3/uL (ref 0.1–0.9)
Monocytes Relative: 5 %
NEUTROS PCT: 88 %
Neutro Abs: 8.7 10*3/uL — ABNORMAL HIGH (ref 1.5–6.5)
Platelet Count: 241 10*3/uL (ref 145–400)
RBC: 3.59 MIL/uL — AB (ref 3.70–5.45)
RDW: 14 % (ref 11.2–14.5)
WBC: 9.9 10*3/uL (ref 3.9–10.3)

## 2017-12-02 LAB — CMP (CANCER CENTER ONLY)
ALT: 60 U/L — ABNORMAL HIGH (ref 0–55)
ANION GAP: 10 (ref 3–11)
AST: 114 U/L — ABNORMAL HIGH (ref 5–34)
Albumin: 2.9 g/dL — ABNORMAL LOW (ref 3.5–5.0)
Alkaline Phosphatase: 831 U/L — ABNORMAL HIGH (ref 40–150)
BUN: 8 mg/dL (ref 7–26)
CO2: 23 mmol/L (ref 22–29)
CREATININE: 0.76 mg/dL (ref 0.60–1.10)
Calcium: 9.1 mg/dL (ref 8.4–10.4)
Chloride: 96 mmol/L — ABNORMAL LOW (ref 98–109)
Glucose, Bld: 132 mg/dL (ref 70–140)
POTASSIUM: 4 mmol/L (ref 3.5–5.1)
Sodium: 129 mmol/L — ABNORMAL LOW (ref 136–145)
Total Bilirubin: 3.4 mg/dL — ABNORMAL HIGH (ref 0.2–1.2)
Total Protein: 7.1 g/dL (ref 6.4–8.3)

## 2017-12-02 LAB — PROTIME-INR
INR: 1.7
PROTHROMBIN TIME: 19.8 s — AB (ref 11.4–15.2)

## 2017-12-02 LAB — INFLUENZA PANEL BY PCR (TYPE A & B)
INFLAPCR: NEGATIVE
INFLBPCR: NEGATIVE

## 2017-12-02 LAB — LACTIC ACID, PLASMA: Lactic Acid, Venous: 1 mmol/L (ref 0.5–1.9)

## 2017-12-02 LAB — LIPASE, BLOOD: Lipase: 19 U/L (ref 11–51)

## 2017-12-02 LAB — I-STAT CG4 LACTIC ACID, ED: Lactic Acid, Venous: 1.41 mmol/L (ref 0.5–1.9)

## 2017-12-02 LAB — APTT: aPTT: 41 seconds — ABNORMAL HIGH (ref 24–36)

## 2017-12-02 LAB — PROCALCITONIN: Procalcitonin: 4.09 ng/mL

## 2017-12-02 MED ORDER — IOPAMIDOL (ISOVUE-300) INJECTION 61%
100.0000 mL | Freq: Once | INTRAVENOUS | Status: AC | PRN
  Administered 2017-12-02: 100 mL via INTRAVENOUS

## 2017-12-02 MED ORDER — PIPERACILLIN-TAZOBACTAM 3.375 G IVPB 30 MIN: 3.3750 g | Freq: Once | INTRAVENOUS | Status: DC

## 2017-12-02 MED ORDER — GADOBENATE DIMEGLUMINE 529 MG/ML IV SOLN
15.0000 mL | Freq: Once | INTRAVENOUS | Status: AC | PRN
  Administered 2017-12-02: 20 mL via INTRAVENOUS

## 2017-12-02 MED ORDER — ACETAMINOPHEN 325 MG PO TABS
650.0000 mg | ORAL_TABLET | Freq: Once | ORAL | Status: AC
  Administered 2017-12-02: 650 mg via ORAL
  Filled 2017-12-02: qty 2

## 2017-12-02 MED ORDER — ACETAMINOPHEN 325 MG PO TABS
650.0000 mg | ORAL_TABLET | Freq: Four times a day (QID) | ORAL | Status: DC | PRN
  Administered 2017-12-02 – 2017-12-09 (×2): 650 mg via ORAL
  Filled 2017-12-02 (×2): qty 2

## 2017-12-02 MED ORDER — MORPHINE SULFATE (PF) 4 MG/ML IV SOLN
INTRAVENOUS | Status: AC
  Filled 2017-12-02: qty 1

## 2017-12-02 MED ORDER — ALBUTEROL SULFATE (2.5 MG/3ML) 0.083% IN NEBU: 2.5000 mg | INHALATION_SOLUTION | RESPIRATORY_TRACT | Status: DC | PRN

## 2017-12-02 MED ORDER — SODIUM CHLORIDE 0.9 % IV SOLN
1000.0000 mL | INTRAVENOUS | Status: DC
  Administered 2017-12-02: 1000 mL via INTRAVENOUS

## 2017-12-02 MED ORDER — SODIUM CHLORIDE 0.9 % IV BOLUS (SEPSIS)
1000.0000 mL | Freq: Once | INTRAVENOUS | Status: AC
  Administered 2017-12-02: 1000 mL via INTRAVENOUS

## 2017-12-02 MED ORDER — ONDANSETRON HCL 4 MG/2ML IJ SOLN: 4.0000 mg | Freq: Four times a day (QID) | INTRAMUSCULAR | Status: DC | PRN

## 2017-12-02 MED ORDER — FLECAINIDE ACETATE 100 MG PO TABS
100.0000 mg | ORAL_TABLET | Freq: Two times a day (BID) | ORAL | Status: DC
  Administered 2017-12-02 – 2017-12-09 (×13): 100 mg via ORAL
  Filled 2017-12-02 (×16): qty 1

## 2017-12-02 MED ORDER — SODIUM CHLORIDE 0.9 % IJ SOLN
INTRAMUSCULAR | Status: AC
  Filled 2017-12-02: qty 50

## 2017-12-02 MED ORDER — AMITRIPTYLINE HCL 25 MG PO TABS
50.0000 mg | ORAL_TABLET | Freq: Every day | ORAL | Status: DC
  Administered 2017-12-02 – 2017-12-08 (×7): 50 mg via ORAL
  Filled 2017-12-02 (×7): qty 2

## 2017-12-02 MED ORDER — SODIUM CHLORIDE 0.9 % IV BOLUS (SEPSIS)
500.0000 mL | Freq: Once | INTRAVENOUS | Status: AC
  Administered 2017-12-02: 500 mL via INTRAVENOUS

## 2017-12-02 MED ORDER — VANCOMYCIN HCL IN DEXTROSE 1-5 GM/200ML-% IV SOLN
1000.0000 mg | Freq: Once | INTRAVENOUS | Status: AC
  Administered 2017-12-02: 1000 mg via INTRAVENOUS
  Filled 2017-12-02: qty 200

## 2017-12-02 MED ORDER — PIPERACILLIN-TAZOBACTAM 3.375 G IVPB
3.3750 g | Freq: Three times a day (TID) | INTRAVENOUS | Status: AC
  Administered 2017-12-02 – 2017-12-09 (×21): 3.375 g via INTRAVENOUS
  Filled 2017-12-02 (×20): qty 50

## 2017-12-02 MED ORDER — LORAZEPAM 0.5 MG PO TABS
0.5000 mg | ORAL_TABLET | Freq: Three times a day (TID) | ORAL | Status: DC | PRN
  Administered 2017-12-02 – 2017-12-09 (×2): 0.5 mg via ORAL
  Filled 2017-12-02 (×2): qty 1

## 2017-12-02 MED ORDER — MORPHINE SULFATE 4 MG/ML IJ SOLN
2.0000 mg | Freq: Once | INTRAMUSCULAR | Status: AC
  Administered 2017-12-02: 2 mg via INTRAVENOUS
  Filled 2017-12-02: qty 1

## 2017-12-02 MED ORDER — FLUTICASONE PROPIONATE 50 MCG/ACT NA SUSP
2.0000 | Freq: Every day | NASAL | Status: DC
  Administered 2017-12-03 – 2017-12-09 (×7): 2 via NASAL
  Filled 2017-12-02: qty 16

## 2017-12-02 MED ORDER — SODIUM CHLORIDE 0.9 % IV SOLN
INTRAVENOUS | Status: AC
  Administered 2017-12-02 – 2017-12-03 (×3): via INTRAVENOUS

## 2017-12-02 MED ORDER — MORPHINE SULFATE 15 MG PO TABS
15.0000 mg | ORAL_TABLET | ORAL | Status: DC | PRN
  Administered 2017-12-03 – 2017-12-06 (×6): 15 mg via ORAL
  Filled 2017-12-02 (×6): qty 1

## 2017-12-02 MED ORDER — CEFEPIME HCL 2 G IJ SOLR
2.0000 g | Freq: Once | INTRAMUSCULAR | Status: AC
  Administered 2017-12-02: 2 g via INTRAVENOUS
  Filled 2017-12-02: qty 2

## 2017-12-02 MED ORDER — ONDANSETRON HCL 4 MG PO TABS: 4.0000 mg | ORAL_TABLET | Freq: Four times a day (QID) | ORAL | Status: DC | PRN

## 2017-12-02 MED ORDER — LORAZEPAM 2 MG/ML IJ SOLN: 0.5000 mg | Freq: Once | INTRAMUSCULAR | Status: DC | PRN

## 2017-12-02 MED ORDER — SODIUM CHLORIDE 0.9% FLUSH
10.0000 mL | INTRAVENOUS | Status: DC | PRN
  Administered 2017-12-05: 10 mL
  Administered 2017-12-09: 20 mL
  Filled 2017-12-02 (×2): qty 40

## 2017-12-02 MED ORDER — VANCOMYCIN HCL 500 MG IV SOLR
500.0000 mg | Freq: Two times a day (BID) | INTRAVENOUS | Status: DC
  Administered 2017-12-02 – 2017-12-03 (×2): 500 mg via INTRAVENOUS
  Filled 2017-12-02 (×3): qty 500

## 2017-12-02 MED ORDER — ACETAMINOPHEN 650 MG RE SUPP: 650.0000 mg | Freq: Four times a day (QID) | RECTAL | Status: DC | PRN

## 2017-12-02 MED ORDER — SODIUM CHLORIDE 0.9 % IV SOLN
Freq: Once | INTRAVENOUS | Status: AC
  Administered 2017-12-02: 12:00:00 via INTRAVENOUS

## 2017-12-02 MED ORDER — IOPAMIDOL (ISOVUE-300) INJECTION 61%
INTRAVENOUS | Status: AC
  Filled 2017-12-02: qty 100
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
## 2017-12-02 NOTE — Telephone Encounter (Signed)
Received call from pt PCP stating "we've done all the evaluation I could think of and haven't been able to pinpoint the source of her fevers. Could your office get her in for an appt"?  Consulted with Ned Card, NP and pt to see Sandi Mealy, PA.   Called pt to inform and pt agreeable to plan. "I've been having fevers for the past week and i'm having stomach pain and I took some of the diarrhea medicine to stop me from going". Pt denies cough, congestion, SOB. Pt coming in to clinic as soon as possible.
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
## 2017-12-02 NOTE — ED Triage Notes (Addendum)
Patient arrived via wheelchair from cancer center. Patient was hypotensive (BP-92/70). Patient has chills and rising temperature. Patient c/o of severe pain in her left abdomen that some times radiates to left flank X 2 weeks. Patients c/o of pressure in her bladder when she urinates. Per cancer center nurse patients cultures X2 have been done. Patient is now on anitibiotic. (maxipime).

## 2017-12-02 NOTE — ED Notes (Signed)
Report given to [facility].

## 2017-12-02 NOTE — Progress Notes (Signed)
A consult was received from an ED physician for Vancomycin and Zosyn per pharmacy dosing.  The patient's profile has been reviewed for ht/wt/allergies/indication/available labs.    Per MAR, patient received Cefepime 2g IV x 1 at [facility] today at 1227. Per Dr. Leonette Monarch, d/c order for Zosyn at this time. A one time order has already been placed by MD for Vancomycin 1g IV.  Further antibiotics/pharmacy consults should be ordered by admitting physician if indicated.         Lindell Spar, PharmD, BCPS Pager: [phone] 12/02/2017 1:28 PM

## 2017-12-02 NOTE — Progress Notes (Signed)
These preliminary result these preliminary results were noted.  Awaiting final report.
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
## 2017-12-02 NOTE — Progress Notes (Signed)
Pt to be admitted to ED room # 5 for further workup of fever/abd pain, elevated LT's and elevated bilirubin. Port accessed , 1 set blood cultures obtained-lab to do 2nd set peripherally. Given MS 2 mg IVP for  Abd. pain 8/10.  1230 pt rates pain 7/10, states she is still feeling chilly despite blankets. VSS Transported to ED via w/c and IV pump. Husband in [place].  Report given to ED RN upon arrival.

## 2017-12-02 NOTE — Progress Notes (Signed)
Pharmacy Antibiotic Note  [name] is a 72 y.o. female admitted on 12/02/2017 with sepsis with c/o ongoing abdominal pain.  PMH significant for pancreatic cancer (s/p Whipple procedure), bile duct stent placement (2015), AFib, PE.  In the ED, patient received Vancomycin 1gm IV and Cefepime 2gm IV x 1 dose each.  Upon admission, Pharmacy has been consulted for Vancomcyin and Zosyn dosing.  Plan: Zosyn 3.375g IV q8h (4 hour infusion).  - Vancomycin 500mg  IV q12h (Goal AUC 400-500) - Check daily SCr - F/U cultures/sensitivities - Monitor vancomycin levels as needed   Temp (24hrs), Avg:99.8 F (37.7 C), Min:97.9 F (36.6 C), Max:102.4 F (39.1 C)  Recent Labs  Lab 12/02/17 1006 12/02/17 1344  WBC 9.9  --   CREATININE 0.76  --   LATICACIDVEN  --  1.41    Estimated Creatinine Clearance: 61.4 mL/min (by C-G formula based on SCr of 0.76 mg/dL).    Allergies  Allergen Reactions  . Ace Inhibitors     Cough  . Scopolamine Other (See Comments)    Dizzy, "lost control of my body", fell down and cracked a rib  . Sulfa Antibiotics Hives    Antimicrobials this admission: 3/12 Cefepime x 1 dose  3/12 Vanc >>   3/12 Zosyn >>  Dose adjustments this admission:    Microbiology results: 3/12 BCx: sent 3/12 Resp Panel PCR: sent   Thank you for allowing pharmacy to be a part of this patient's care.  Everette Rank, PharmD 12/02/2017 5:12 PM

## 2017-12-02 NOTE — ED Notes (Signed)
Bed: WA06 Expected date:  Expected time:  Means of arrival:  Comments: Cancer center, Hypotension.
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
## 2017-12-03 DIAGNOSIS — R109 Unspecified abdominal pain: Secondary | ICD-10-CM

## 2017-12-03 DIAGNOSIS — R935 Abnormal findings on diagnostic imaging of other abdominal regions, including retroperitoneum: Secondary | ICD-10-CM

## 2017-12-03 DIAGNOSIS — R1013 Epigastric pain: Secondary | ICD-10-CM

## 2017-12-03 DIAGNOSIS — E871 Hypo-osmolality and hyponatremia: Secondary | ICD-10-CM

## 2017-12-03 DIAGNOSIS — C250 Malignant neoplasm of head of pancreas: Secondary | ICD-10-CM

## 2017-12-03 DIAGNOSIS — R509 Fever, unspecified: Secondary | ICD-10-CM

## 2017-12-03 DIAGNOSIS — K831 Obstruction of bile duct: Secondary | ICD-10-CM

## 2017-12-03 DIAGNOSIS — A419 Sepsis, unspecified organism: Secondary | ICD-10-CM

## 2017-12-03 DIAGNOSIS — I959 Hypotension, unspecified: Secondary | ICD-10-CM

## 2017-12-03 DIAGNOSIS — I482 Chronic atrial fibrillation: Secondary | ICD-10-CM

## 2017-12-03 DIAGNOSIS — C259 Malignant neoplasm of pancreas, unspecified: Secondary | ICD-10-CM

## 2017-12-03 DIAGNOSIS — K9189 Other postprocedural complications and disorders of digestive system: Secondary | ICD-10-CM

## 2017-12-03 LAB — COMPREHENSIVE METABOLIC PANEL
ALT: 57 U/L — ABNORMAL HIGH (ref 14–54)
AST: 120 U/L — AB (ref 15–41)
Albumin: 2.4 g/dL — ABNORMAL LOW (ref 3.5–5.0)
Alkaline Phosphatase: 566 U/L — ABNORMAL HIGH (ref 38–126)
Anion gap: 7 (ref 5–15)
BILIRUBIN TOTAL: 4.6 mg/dL — AB (ref 0.3–1.2)
BUN: 6 mg/dL (ref 6–20)
CO2: 24 mmol/L (ref 22–32)
CREATININE: 0.42 mg/dL — AB (ref 0.44–1.00)
Calcium: 8.1 mg/dL — ABNORMAL LOW (ref 8.9–10.3)
Chloride: 108 mmol/L (ref 101–111)
GFR calc Af Amer: >60 mL/min (ref >60)
Glucose, Bld: 104 mg/dL — ABNORMAL HIGH (ref 65–99)
POTASSIUM: 3.4 mmol/L — AB (ref 3.5–5.1)
Sodium: 139 mmol/L (ref 135–145)
TOTAL PROTEIN: 5.8 g/dL — AB (ref 6.5–8.1)

## 2017-12-03 LAB — BLOOD CULTURE ID PANEL (REFLEXED)
Acinetobacter baumannii: NOT DETECTED
CANDIDA PARAPSILOSIS: NOT DETECTED
Candida albicans: NOT DETECTED
Candida glabrata: NOT DETECTED
Candida krusei: NOT DETECTED
Candida tropicalis: NOT DETECTED
ENTEROCOCCUS SPECIES: NOT DETECTED
Enterobacter cloacae complex: NOT DETECTED
Enterobacteriaceae species: NOT DETECTED
Escherichia coli: NOT DETECTED
Haemophilus influenzae: NOT DETECTED
KLEBSIELLA OXYTOCA: NOT DETECTED
Klebsiella pneumoniae: NOT DETECTED
LISTERIA MONOCYTOGENES: NOT DETECTED
Neisseria meningitidis: NOT DETECTED
PSEUDOMONAS AERUGINOSA: NOT DETECTED
Proteus species: NOT DETECTED
SERRATIA MARCESCENS: NOT DETECTED
STAPHYLOCOCCUS AUREUS BCID: NOT DETECTED
STREPTOCOCCUS PYOGENES: NOT DETECTED
STREPTOCOCCUS SPECIES: NOT DETECTED
Staphylococcus species: NOT DETECTED
Streptococcus agalactiae: NOT DETECTED
Streptococcus pneumoniae: NOT DETECTED

## 2017-12-03 LAB — CBC
HEMATOCRIT: 25.3 % — AB (ref 36.0–46.0)
Hemoglobin: 8.6 g/dL — ABNORMAL LOW (ref 12.0–15.0)
MCH: 29.6 pg (ref 26.0–34.0)
MCHC: 34 g/dL (ref 30.0–36.0)
MCV: 86.9 fL (ref 78.0–100.0)
PLATELETS: 235 10*3/uL (ref 150–400)
RBC: 2.91 MIL/uL — ABNORMAL LOW (ref 3.87–5.11)
RDW: 14.4 % (ref 11.5–15.5)
WBC: 7.8 10*3/uL (ref 4.0–10.5)

## 2017-12-03 LAB — RESPIRATORY PANEL BY PCR
ADENOVIRUS-RVPPCR: NOT DETECTED
BORDETELLA PERTUSSIS-RVPCR: NOT DETECTED
CHLAMYDOPHILA PNEUMONIAE-RVPPCR: NOT DETECTED
CORONAVIRUS 229E-RVPPCR: NOT DETECTED
CORONAVIRUS HKU1-RVPPCR: NOT DETECTED
CORONAVIRUS NL63-RVPPCR: NOT DETECTED
Coronavirus OC43: NOT DETECTED
Influenza A: NOT DETECTED
Influenza B: NOT DETECTED
MYCOPLASMA PNEUMONIAE-RVPPCR: NOT DETECTED
Metapneumovirus: NOT DETECTED
Parainfluenza Virus 1: NOT DETECTED
Parainfluenza Virus 2: NOT DETECTED
Parainfluenza Virus 3: NOT DETECTED
Parainfluenza Virus 4: NOT DETECTED
Respiratory Syncytial Virus: NOT DETECTED
Rhinovirus / Enterovirus: NOT DETECTED

## 2017-12-03 LAB — URINE CULTURE: Culture: NO GROWTH

## 2017-12-03 LAB — PHOSPHORUS: PHOSPHORUS: 3 mg/dL (ref 2.5–4.6)

## 2017-12-03 LAB — MAGNESIUM: Magnesium: 2 mg/dL (ref 1.7–2.4)

## 2017-12-03 LAB — LACTIC ACID, PLASMA: Lactic Acid, Venous: 1 mmol/L (ref 0.5–1.9)

## 2017-12-03 MED ORDER — ZOLPIDEM TARTRATE 5 MG PO TABS
5.0000 mg | ORAL_TABLET | Freq: Every evening | ORAL | Status: DC | PRN
  Administered 2017-12-03 – 2017-12-08 (×6): 5 mg via ORAL
  Filled 2017-12-03 (×6): qty 1

## 2017-12-03 MED ORDER — POTASSIUM CHLORIDE 10 MEQ/100ML IV SOLN
10.0000 meq | INTRAVENOUS | Status: AC
Start: 2017-12-03 — End: 2017-12-03
  Administered 2017-12-03 (×3): 10 meq via INTRAVENOUS
  Filled 2017-12-03 (×3): qty 100

## 2017-12-03 NOTE — Consult Note (Signed)
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 0850 12/02/17 1006 12/03/17 0515  BILITOT 0.7 1.1 3.4* 4.6*  AST 40* 125* 114* 120*  ALT 21 79* 60* 57*  ALKPHOS 122* 574* 831* 566*  PROT 6.9 7.1 7.1 5.8*  ALBUMIN 3.9 3.3* 2.9* 2.4*    TUMOR MARKERS: No results for input(s): AFPTM, CEA, CA199, CHROMGRNA in the last 8760 hours.  Assessment and Plan: 71 y.o. 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  She now presents with persistent abdominal pain, primarily epigastric /left upper quadrant as well as nausea and some intermittent fevers.  MRI of the abdomen read today revealed:  1. Postoperative changes from Whipple procedure again noted. On today's study there is marked and progressive dilatation of the hepaticojejunostomy loop, bile ducts and main pancreatic duct. Focal area of narrowing of the  hepaticojejunostomy loop as it crosses the midline from right to left. Findings may represent underlying postsurgical scarring and stricture formation versus recurrent tumor. 2. There is a focal area of  abnormal signal in the region of the caudate lobe of liver with increased enhancement. It is unclear whether not this is abnormal signal and enhancement within the liver parenchyma or adenopathy. A PET-CT may be helpful to assess for recurrent metabolically active tumor  Patient is currently afebrile and latest labs include WBC 7.8, hemoglobin 8.6, platelets 235k, creatinine 0.42, PT 19.8, INR 1.7, total bilirubin 4.6.  Request now received from GI service for PTC with biliary drain placement.  Imaging studies have been reviewed by Dr. Kathlene Cote.  Patient has been off [place] since 2/11.Risks and benefits discussed with the patient/spouse including bleeding, infection, damage to adjacent structures, need for prolonged drainage and sepsis.  All of the patient's questions were answered, patient is agreeable to proceed. Consent signed and in chart.  Procedure tentatively scheduled for 3/14   Thank you for this interesting consult.  I greatly enjoyed meeting [name] and look forward to participating in their care.  A copy of this report was sent to the requesting provider on this date.  Electronically Signed: D. Rowe Robert, PA-C 12/03/2017, 5:59 PM   I spent a total of 40 minutes    in face to face in clinical consultation, greater than 50% of which was counseling/coordinating care for percutaneous transhepatic cholangiogram with biliary drain placement

## 2017-12-03 NOTE — Progress Notes (Signed)
PROGRESS NOTE    BRENT NOTO  TGG:[id number] DOB: 12-Apr-1946 DOA: 12/02/2017 PCP: Chesley Noon, MD   Brief Narrative:  HPI [name]:  [name] is a 72 y.o. 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  Presented with abdominal pain ongoing for 2-3 weeks. It was 7 out of 10 in intensity.  Sharp pain in the upper abdomen without any radiation.  No precipitating factors identified.  No aggravating or relieving factors.  She was seen at the cancer center.  It appears that she is been having the symptoms since the end of February.  At that time oncology was consider referring the patient to gastroenterology.  Patient was diagnosed with sinusitis and was placed on oral antibiotics but she developed diarrhea.  She saw her primary care physician yesterday and took a stool sample which was sent for C. difficile. Those results are not available yet.  But she stopped taking her antibiotics and her diarrhea has resolved.  Her abdominal pain however continues.  She has had some nausea but no vomiting.  Denies any facial pain.  Denies skin rashes shortness of breath cough.  Denies any joint pains.  Some pressure with urinating but denies any burning sensation.  Evaluation in the emergency department reveals that patient is febrile.  Patient was noted to have borderline low blood pressures.  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  Patient will need hospitalization for further management.  There is concern for sepsis.  *Patient states she was feeling better today and was hungry. Still complaining of Sharp Abdominal Pain mostly epigastric and left sided. GI, General Surgery, and Oncology  consulted and Evaluating and recommending IR evaluation.   Assessment & Plan:   Principal Problem:   Fever Active Problems:   Abdominal pain   Hyponatremia   Adenocarcinoma of head of pancreas (HCC)   Chronic anticoagulation   Anemia   Chronic atrial fibrillation (HCC)   Hypotension   Sepsis (HCC)  Sepsis -Etiology unclear but possibly GI/biliary in origin.   -Considering her abnormal LFTs and findings and CT scan cholangitis and obstructive duct needs to be considered.  Follow-up on culture data.   -C/w IV Vancomycin and Zosyn for now.   -Lactic acid level is reassuringly normal and went from 1.41 -> 1.0 -Pro-calcitonin was 4.09. -Patient's Fever has improved and she has no White Count -C/w IVF Rehydration with NS at 125 mL/hr x 24 hours -Sepsis physiology improving   Abdominal Pain -CT scan showed Mildly progressive intrahepatic and extrahepatic ductal dilatation, as described above. Associated dilatation of the afferent limb. 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  -General Surgery has been consulted and recommended GI consult to eval the Afferent Limb and IR to see if they can decompress the biliary system with transhepatic drain -Gastroenterology consulted and MRCP done.See below.   Abnormal liver function tests including elevated alkaline phosphatase, bilirubin and transaminases -AST went from 114 -> 120 and ALT went from 60 -> 57 -As discussed above.  -MRCP showed Postoperative changes from Whipple procedure again noted. On today's study there is marked and progressive dilatation of the hepaticojejunostomy loop, bile ducts and main pancreatic duct. Focal area of narrowing of the  hepaticojejunostomy loop as it crosses the midline from right to left. Findings may represent underlying postsurgical scarring and stricture formation versus recurrent Tumor. There was also a focal area of abnormal signal in the  region of the caudate lobe of liver with increased enhancement. It is unclear whether not this is abnormal signal and enhancement within the liver parenchyma or adenopathy -Gastroenterology Consulted for further evaluation and treatment and recommending C/w IV Zosyn and Vancomycin; Full Liquid Deit and NPO after midnight -GI recommending IR Consult and likely need Percutaneous Drainage of the Bile Duct; Last dose of Xarelto yesterday. There will be a possible EGD as well to access the hepaticojejunostomy / Afferent loop for view of ? Biopsy of stricture area.   Hypotension -She is asymptomatic and BP is improving -C/w IVF Rehydration .  Hyponatremia -Improved. Was likely from Hypovolemia -Patient's Na+ went from 129 -> 139 -C/w with NS at 125 mL/hr for 24 hours -Continue to Monitor  -Repeat CMP in AM   Normocytic Anemia -Likely Hemo-concentrated on Admission and likley dilutional Drop now  -Likely Anemia of chronic disease.  No overt blood loss noted. -Hb/Hct went from 10.6/31.6 -> 8.6/25.3 -Continue to Monitor for S/Sx of Bleeding as she was previously Anticoagulated with Xarelto  -Repeat CBC in AM   Recent Diarrhea -Probably due to antibiotics.   -Diarrhea appears to have resolved.  She tells me that she took a stool sample to her PCP yesterday. Those results are not available to [place] yet.  But her diarrhea has resolved.   We will continue to monitor for no and doubt it is C Difficile given no WBC.Marland Kitchen  History of Pancreatic Cancer -Followed by Oncology Dr. Julieanne Manson.  -Patient's CA 19-9 starting to elevate -MRCP showed Postoperative changes from Whipple procedure again noted. On today's study there is marked and progressive dilatation of the hepaticojejunostomy loop, bile ducts and main pancreatic duct. Focal area of narrowing of the hepaticojejunostomy loop as it crosses the midline from right to left. Findings may represent underlying postsurgical scarring and stricture  formation versus recurrent tumor. -Not on Chemotherapy currently.   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  -Pain control as before.  History of Paroxysmal Atrial Fibrillation -She is on Flecainide which will be continued.   -Hold her metoprolol due to soft blood pressure.   -She is on Xarelto which will be held in case the patient needs to undergo procedure.  Hypokalemia -Patient's K+ was 3.4 this AM -Replete with IV KCl 30 mEQ  -Continue to Monitor and Replete as Necessary -Repeat CMP in AM   DVT prophylaxis:  Code Status: DO NOT RESUSCITATE  Family Communication: Discussed with Husband at bedside  Disposition Plan: Remain Inpatient   Consultants:   General Surgery  Gastroenterology  Oncology   IR  Procedures: None   Antimicrobials:  Anti-infectives (From admission, onward)   Start     Dose/Rate Route Frequency Ordered Stop   12/02/17 2200  vancomycin (VANCOCIN) 500 mg in sodium chloride 0.9 % 100 mL IVPB     500 mg 100 mL/hr over 60 Minutes Intravenous Every 12 hours 12/02/17 1726     12/02/17 2100  piperacillin-tazobactam (ZOSYN) IVPB 3.375 g     3.375 g 12.5 mL/hr over 240 Minutes Intravenous Every 8 hours 12/02/17 1724     12/02/17 1330  piperacillin-tazobactam (ZOSYN) IVPB 3.375 g  Status:  Discontinued     3.375 g 100 mL/hr over 30 Minutes Intravenous  Once 12/02/17 1323 12/02/17 1326  12/02/17 1330  vancomycin (VANCOCIN) IVPB 1000 mg/200 mL premix     1,000 mg 200 mL/hr over 60 Minutes Intravenous  Once 12/02/17 1323 12/02/17 1445     Subjective: Seen and examined and was hungry. No CP or SOB. Still had abdominal pain. No lightheadedness or dizziness. No other complaints or concerns at this time.   Objective: Vitals:   12/02/17 1810 12/02/17 2320 12/03/17 0534 12/03/17 1417  BP: (!) 95/52 94/79 118/78 124/63  Pulse: 66 82 63 71  Resp: (!) 22 20 18 20   Temp:  98.6 F (37 C) 97.6 F (36.4 C) 97.9 F (36.6 C)  TempSrc:   Oral Oral Oral  SpO2: 96% 99% 99% 100%    Intake/Output Summary (Last 24 hours) at 12/03/2017 1621 Last data filed at 12/03/2017 1322 Gross per 24 hour  Intake 2139.58 ml  Output -  Net 2139.58 ml   There were no vitals filed for this visit.  Examination: Physical Exam:  Constitutional: Caucasian female in NAD and appears calm and comfortable Eyes: Lids and conjunctivae normal, sclerae anicteric  ENMT: External Ears, Nose appear normal. Grossly normal hearing. Mucous membranes are moist. Neck: Appears normal, supple, no cervical masses, normal ROM, no appreciable thyromegaly, no JVD Respiratory: Diminished to auscultation bilaterally, no wheezing, rales, rhonchi or crackles. Normal respiratory effort and patient is not tachypenic. No accessory muscle use.  Cardiovascular: RRR, no murmurs / rubs / gallops. S1 and S2 auscultated. No extremity edema. Abdomen: Soft, tender to palpate in mid-epigastric region, non-distended. No masses palpated. Bowel sounds positive x4.  GU: Deferred. Musculoskeletal: No clubbing / cyanosis of digits/nails. No joint deformity upper and lower extremities. Normal strength and muscle tone.  Skin: No rashes, lesions, ulcers on a limited skin eval. No induration; Warm and dry.  Neurologic: CN 2-12 grossly intact with no focal deficits. Romberg sign cerebellar reflexes not assessed.  Psychiatric: Normal judgment and insight. Alert and oriented x 3. Normal mood and appropriate affect.   Data Reviewed: I have personally reviewed following labs and imaging studies  CBC: Recent Labs  Lab 12/02/17 1006 12/03/17 0515  WBC 9.9 7.8  NEUTROABS 8.7*  --   HGB  --  8.6*  HCT 31.6* 25.3*  MCV 88.0 86.9  PLT 241 353   Basic Metabolic Panel: Recent Labs  Lab 12/02/17 1006 12/03/17 0515  NA 129* 139  K 4.0 3.4*  CL 96* 108  CO2 23 24  GLUCOSE 132 104*  BUN 8 6  CREATININE 0.76 0.42*  CALCIUM 9.1 8.1*  MG  --  2.0  PHOS  --  3.0   GFR: Estimated  Creatinine Clearance: 61.4 mL/min (A) (by C-G formula based on SCr of 0.42 mg/dL (L)). Liver Function Tests: Recent Labs  Lab 12/02/17 1006 12/03/17 0515  AST 114* 120*  ALT 60* 57*  ALKPHOS 831* 566*  BILITOT 3.4* 4.6*  PROT 7.1 5.8*  ALBUMIN 2.9* 2.4*   Recent Labs  Lab 12/02/17 1325  LIPASE 19   No results for input(s): AMMONIA in the last 168 hours. Coagulation Profile: Recent Labs  Lab 12/02/17 1928  INR 1.70   Cardiac Enzymes: No results for input(s): CKTOTAL, CKMB, CKMBINDEX, TROPONINI in the last 168 hours. BNP (last 3 results) No results for input(s): PROBNP in the last 8760 hours. HbA1C: No results for input(s): HGBA1C in the last 72 hours. CBG: No results for input(s): GLUCAP in the last 168 hours. Lipid Profile: No results for input(s): CHOL, HDL, LDLCALC, TRIG, CHOLHDL, LDLDIRECT  in the last 72 hours. Thyroid Function Tests: No results for input(s): TSH, T4TOTAL, FREET4, T3FREE, THYROIDAB in the last 72 hours. Anemia Panel: No results for input(s): VITAMINB12, FOLATE, FERRITIN, TIBC, IRON, RETICCTPCT in the last 72 hours. Sepsis Labs: Recent Labs  Lab 12/02/17 1344 12/02/17 1928 12/02/17 2323  PROCALCITON  --  4.09  --   LATICACIDVEN 1.41 1.0 1.0    Recent Results (from the past 240 hour(s))  Urine Culture     Status: None   Collection Time: 12/02/17 10:04 AM  Result Value Ref Range Status   Specimen Description   Final    URINE, CLEAN CATCH Performed at [facility], [place] [address], [place], [place] [address]    Special Requests   Final    NONE Performed at [facility], [place] [address] [place], [place] [address]    Culture   Final    NO GROWTH Performed at [facility], [place] [address] [place], [place] [address]    Report Status 12/03/2017 FINAL  Final  Culture, Blood     Status: None (Preliminary result)   Collection Time: 12/02/17 11:39 AM  Result Value Ref Range Status    Specimen Description BLOOD RIGHT ARM  Final   Special Requests   Final    BOTTLES DRAWN AEROBIC AND ANAEROBIC Blood Culture results may not be optimal due to an excessive volume of blood received in culture bottles   Culture  Setup Time   Final    GRAM NEGATIVE RODS ANAEROBIC BOTTLE ONLY Organism ID to follow Performed at [facility], [place] [address], [place], [place] [address]    Culture PENDING  Incomplete   Report Status PENDING  Incomplete  Culture, Blood     Status: None (Preliminary result)   Collection Time: 12/02/17 11:50 AM  Result Value Ref Range Status   Specimen Description BLOOD PORTA CATH  Final   Special Requests   Final    BOTTLES DRAWN AEROBIC AND ANAEROBIC Blood Culture adequate volume   Culture   Final    NO GROWTH < 24 HOURS Performed at [facility], [place] [address] [place], [place] [address]    Report Status PENDING  Incomplete  Blood Culture (routine x 2)     Status: None (Preliminary result)   Collection Time: 12/02/17  1:27 PM  Result Value Ref Range Status   Specimen Description   Final    BLOOD LEFT PORTA CATH Performed at [place] [address] [place], [place] [address]    Special Requests   Final    BOTTLES DRAWN AEROBIC AND ANAEROBIC Blood Culture adequate volume Performed at [place] [address] [place],  [address]    Culture   Final    NO GROWTH < 24 HOURS Performed at [place] [address] [place],  [address]    Report Status PENDING  Incomplete  Respiratory Panel by PCR     Status: None   Collection Time: 12/02/17  3:40 PM  Result Value Ref Range Status   Adenovirus NOT DETECTED NOT DETECTED Final   Coronavirus 229E NOT DETECTED NOT DETECTED Final   Coronavirus HKU1 NOT DETECTED NOT DETECTED Final   Coronavirus NL63 NOT DETECTED NOT DETECTED Final   Coronavirus OC43 NOT DETECTED NOT DETECTED Final   Metapneumovirus NOT DETECTED NOT DETECTED  Final   Rhinovirus / Enterovirus NOT DETECTED NOT DETECTED Final  Influenza A NOT DETECTED NOT DETECTED Final   Influenza B NOT DETECTED NOT DETECTED Final   Parainfluenza Virus 1 NOT DETECTED NOT DETECTED Final   Parainfluenza Virus 2 NOT DETECTED NOT DETECTED Final   Parainfluenza Virus 3 NOT DETECTED NOT DETECTED Final   Parainfluenza Virus 4 NOT DETECTED NOT DETECTED Final   Respiratory Syncytial Virus NOT DETECTED NOT DETECTED Final   Bordetella pertussis NOT DETECTED NOT DETECTED Final   Chlamydophila pneumoniae NOT DETECTED NOT DETECTED Final   Mycoplasma pneumoniae NOT DETECTED NOT DETECTED Final    Comment: Performed at [facility], [place] [address], [place], [place] [address]    Radiology Studies: Dg Chest 2 View  Result Date: 12/02/2017 CLINICAL DATA:  Flu like symptoms EXAM: CHEST - 2 VIEW COMPARISON:  Chest CT 01/25/2017 and CXR 01/25/2017 FINDINGS: The heart size and mediastinal contours are within normal limits. Chronic mild interstitial prominence without alveolar consolidation, dominant mass, effusion or pneumothorax. Left-sided port catheter is noted with tip in the mid SVC. No acute osseous abnormality. Partially visualized left shoulder arthroplasty. IMPRESSION: No active cardiopulmonary disease. Electronically Signed   By: Ashley Royalty M.D.   On: 12/02/2017 17:16   Ct Abdomen Pelvis W Contrast  Result Date: 12/02/2017 CLINICAL DATA:  Acute abdominal pain. Hypotension. Fever/chills. Pancreatic cancer diagnosed 2015. Endometrial cancer diagnosed 2012. EXAM: CT ABDOMEN AND PELVIS WITH CONTRAST TECHNIQUE: Multidetector CT imaging of the abdomen and pelvis was performed using the standard protocol following bolus administration of intravenous contrast. CONTRAST:  140mL ISOVUE-300 IOPAMIDOL (ISOVUE-300) INJECTION 61% COMPARISON:  11/18/2017. FINDINGS: Lower chest: Lung bases are clear. Hepatobiliary: No suspicious/enhancing hepatic lesions. Status post Whipple procedure with  choledochojejunostomy. Mild intrahepatic and extrahepatic ductal dilatation. Dilated common duct, measuring 14 mm centrally (coronal image 44, previously 11 mm. Pancreas: Status post Whipple procedure with pancreaticojejunostomy. Atrophy of the residual pancreatic body/tail with mild pancreatic duct dilatation (series 2/image 24). Spleen: Within normal limits. Adrenals/Urinary Tract: Adrenal glands within normal limits. 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 Left kidney is within normal limits. No hydronephrosis. Bladder is within normal limits. Stomach/Bowel: Status post partial gastrectomy with gastrojejunostomy. Mildly dilated afferent limb in the right upper quadrant (series 2/image 29), although with improved wall thickening when compared to the prior CT. No evidence of bowel obstruction. Appendix is not discretely visualized. Vascular/Lymphatic: No evidence of abdominal aortic aneurysm. Atherosclerotic calcifications of the abdominal aorta and branch vessels. No suspicious abdominopelvic lymphadenopathy. Reproductive: Status post hysterectomy. No adnexal masses. Other: No abdominopelvic ascites. Musculoskeletal: Mild superior endplate Schmorl's node deformities at T10, T11, and L5. IMPRESSION: Mildly progressive intrahepatic and extrahepatic ductal dilatation, as described above. Associated dilatation of the afferent limb. 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 GI/surgical consultation is suggested. No findings specific for recurrent or metastatic disease in this patient with history of pancreatic cancer and endometrial cancer. Electronically Signed   By: Julian Hy M.D.   On: 12/02/2017 15:13   Mr 3d Recon At Scanner  Result Date: 12/03/2017 CLINICAL DATA:  Evaluate for pancreatitis. History of pancreas cancer. Now with ongoing abdominal pain. Progressive biliary dilatation. EXAM: MRI ABDOMEN WITHOUT AND WITH CONTRAST (INCLUDING MRCP)  TECHNIQUE: Multiplanar multisequence MR imaging of the abdomen was performed both before and after the administration of intravenous contrast. Heavily T2-weighted images of the biliary and pancreatic ducts were obtained, and three-dimensional MRCP images were rendered by post processing. CONTRAST:  24mL MULTIHANCE GADOBENATE DIMEGLUMINE 529 MG/ML IV SOLN COMPARISON:  12/02/2017 FINDINGS:  Lower chest: No acute findings. Hepatobiliary: Diffusely heterogeneous enhancement pattern of the liver is identified. There is a focal area of increased T2 signal within the region of the caudate lobe of liver which measures 2.4 by 2.5 cm, image 23/3. This demonstrates relative increased enhancement compared with the remainder of the liver, image 48/1301. Within the remaining portions of the liver there is heterogeneous attenuation and enhancement. There is marked intrahepatic bile duct dilatation. This demonstrates significant progression when compared with 11/18/17. Pancreas: Residual pancreas is largely atrophic with dilatation of the main duct, image 59/1303. The main duct measures up to 7 mm. Spleen:  Within normal limits in size and appearance. Adrenals/Urinary Tract: The adrenal glands are normal. Unremarkable appearance of the kidneys. Stomach/Bowel: The stomach and the remaining small bowel loops are unremarkable. Dilatation of the hepaticojejunostomy loop is identified. There appears to be marked narrowing of the hepaticojejunostomy loop at the point at which the crosses the midline (from right to left) between the aorta and SMV, image 72/1303. Findings may reflect underlying postsurgical scarring. Recurrent tumor not excluded. Vascular/Lymphatic:  Normal appearance of the abdominal aorta. Other:  No ascites or focal fluid collections identified. Musculoskeletal: No suspicious bone lesions identified. IMPRESSION: 1. Postoperative changes from Whipple procedure again noted. On today's study there is marked and progressive  dilatation of the hepaticojejunostomy loop, bile ducts and main pancreatic duct. Focal area of narrowing of the hepaticojejunostomy loop as it crosses the midline from right to left. Findings may represent underlying postsurgical scarring and stricture formation versus recurrent tumor. 2. There is a focal area of abnormal signal in the region of the caudate lobe of liver with increased enhancement. It is unclear whether not this is abnormal signal and enhancement within the liver parenchyma or adenopathy. A PET-CT may be helpful to assess for recurrent metabolically active tumor. Electronically Signed   By: Kerby Moors M.D.   On: 12/03/2017 08:37   Mr Abdomen Mrcp Moise Boring Contast  Result Date: 12/03/2017 CLINICAL DATA:  Evaluate for pancreatitis. History of pancreas cancer. Now with ongoing abdominal pain. Progressive biliary dilatation. EXAM: MRI ABDOMEN WITHOUT AND WITH CONTRAST (INCLUDING MRCP) TECHNIQUE: Multiplanar multisequence MR imaging of the abdomen was performed both before and after the administration of intravenous contrast. Heavily T2-weighted images of the biliary and pancreatic ducts were obtained, and three-dimensional MRCP images were rendered by post processing. CONTRAST:  40mL MULTIHANCE GADOBENATE DIMEGLUMINE 529 MG/ML IV SOLN COMPARISON:  12/02/2017 FINDINGS: Lower chest: No acute findings. Hepatobiliary: Diffusely heterogeneous enhancement pattern of the liver is identified. There is a focal area of increased T2 signal within the region of the caudate lobe of liver which measures 2.4 by 2.5 cm, image 23/3. This demonstrates relative increased enhancement compared with the remainder of the liver, image 48/1301. Within the remaining portions of the liver there is heterogeneous attenuation and enhancement. There is marked intrahepatic bile duct dilatation. This demonstrates significant progression when compared with 11/18/17. Pancreas: Residual pancreas is largely atrophic with dilatation of  the main duct, image 59/1303. The main duct measures up to 7 mm. Spleen:  Within normal limits in size and appearance. Adrenals/Urinary Tract: The adrenal glands are normal. Unremarkable appearance of the kidneys. Stomach/Bowel: The stomach and the remaining small bowel loops are unremarkable. Dilatation of the hepaticojejunostomy loop is identified. There appears to be marked narrowing of the hepaticojejunostomy loop at the point at which the crosses the midline (from right to left) between the aorta and SMV, image 72/1303. Findings may reflect underlying  postsurgical scarring. Recurrent tumor not excluded. Vascular/Lymphatic:  Normal appearance of the abdominal aorta. Other:  No ascites or focal fluid collections identified. Musculoskeletal: No suspicious bone lesions identified. IMPRESSION: 1. Postoperative changes from Whipple procedure again noted. On today's study there is marked and progressive dilatation of the hepaticojejunostomy loop, bile ducts and main pancreatic duct. Focal area of narrowing of the hepaticojejunostomy loop as it crosses the midline from right to left. Findings may represent underlying postsurgical scarring and stricture formation versus recurrent tumor. 2. There is a focal area of abnormal signal in the region of the caudate lobe of liver with increased enhancement. It is unclear whether not this is abnormal signal and enhancement within the liver parenchyma or adenopathy. A PET-CT may be helpful to assess for recurrent metabolically active tumor. Electronically Signed   By: Kerby Moors M.D.   On: 12/03/2017 08:37   US Abdomen Limited Ruq  Result Date: 12/02/2017 CLINICAL DATA:  Abdominal pain.  History of pancreatic carcinoma EXAM: ULTRASOUND ABDOMEN LIMITED RIGHT UPPER QUADRANT COMPARISON:  CT abdomen and pelvis November 18, 2017 FINDINGS: Gallbladder: Surgically absent. Common bile duct: Diameter: 11 mm, prominent. No biliary duct mass or calculus is appreciable by ultrasound.  Liver: No focal lesion identified. Liver echogenicity overall is increased. There is intrahepatic biliary duct dilatation. Portal vein is patent on color Doppler imaging with normal direction of blood flow towards the liver. IMPRESSION: 1. Gallbladder absent. There is biliary duct dilatation, similar to recent CT. No biliary duct mass or calculus evident. 2. Diffuse increase in liver echogenicity, a finding felt to be indicative of hepatic steatosis. While no focal liver lesions are evident, it must be cautioned that the sensitivity of ultrasound for detection of focal liver lesions is diminished significantly in this circumstance. Electronically Signed   By: Lowella Grip III M.D.   On: 12/02/2017 14:19   Scheduled Meds: . amitriptyline  50 mg Oral QHS  . flecainide  100 mg Oral BID  . fluticasone  2 spray Each Nare Daily   Continuous Infusions: . sodium chloride 125 mL/hr at 12/03/17 1207  . piperacillin-tazobactam (ZOSYN)  IV 3.375 g (12/03/17 1422)  . vancomycin Stopped (12/03/17 1146)    LOS: 1 day   Kerney Elbe, DO [facility] Pager [phone]  If 7PM-7AM, please contact night-coverage [email or web address] Password [facility] 12/03/2017, 4:21 PM

## 2017-12-03 NOTE — Progress Notes (Signed)
PHARMACY - PHYSICIAN COMMUNICATION CRITICAL VALUE ALERT - BLOOD CULTURE IDENTIFICATION (BCID)  [name] is an 72 y.o. female who presented to [facility] on 12/02/2017 with a chief complaint of Sepsis of unclear source, but possibly GI/biliaryin origin.  3/12 BCx: GNR, anaerobic bottle only 3/12 BCID: none detected  Name of physician (or Provider) Contacted: Sheikh  Current antibiotics: Vancomycin and Zosyn  Changes to prescribed antibiotics recommended:   Continue Zosyn 3.375g IV Q8H infused over 4hrs.  Discontinue Vancomycin  Results for orders placed or performed in visit on 12/02/17  Blood Culture ID Panel (Reflexed) (Collected: 12/02/2017 11:39 AM)  Result Value Ref Range   Enterococcus species NOT DETECTED NOT DETECTED   Listeria monocytogenes NOT DETECTED NOT DETECTED   Staphylococcus species NOT DETECTED NOT DETECTED   Staphylococcus aureus NOT DETECTED NOT DETECTED   Streptococcus species NOT DETECTED NOT DETECTED   Streptococcus agalactiae NOT DETECTED NOT DETECTED   Streptococcus pneumoniae NOT DETECTED NOT DETECTED   Streptococcus pyogenes NOT DETECTED NOT DETECTED   Acinetobacter baumannii NOT DETECTED NOT DETECTED   Enterobacteriaceae species NOT DETECTED NOT DETECTED   Enterobacter cloacae complex NOT DETECTED NOT DETECTED   Escherichia coli NOT DETECTED NOT DETECTED   Klebsiella oxytoca NOT DETECTED NOT DETECTED   Klebsiella pneumoniae NOT DETECTED NOT DETECTED   Proteus species NOT DETECTED NOT DETECTED   Serratia marcescens NOT DETECTED NOT DETECTED   Haemophilus influenzae NOT DETECTED NOT DETECTED   Neisseria meningitidis NOT DETECTED NOT DETECTED   Pseudomonas aeruginosa NOT DETECTED NOT DETECTED   Candida albicans NOT DETECTED NOT DETECTED   Candida glabrata NOT DETECTED NOT DETECTED   Candida krusei NOT DETECTED NOT DETECTED   Candida parapsilosis NOT DETECTED NOT DETECTED   Candida tropicalis NOT DETECTED NOT DETECTED    Gretta Arab  PharmD, BCPS Pager [phone] 12/03/2017 6:07 PM

## 2017-12-03 NOTE — Progress Notes (Signed)
These preliminary result these preliminary results were noted.  Awaiting final report.

## 2017-12-03 NOTE — Progress Notes (Signed)
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 [name]. History of endometrial cancer History of GERD/gastritis. History of hypertension History of PAF (on Xarelto)  - pt's pain and fevers have improved.  - recommend Oncology consult, GI consult with possible endoscopy to evaluate the afferent limb.  She may also need IR evaluation for possible transhepatic drainage and then brushings.  At this point there is no surgical treatment.  We will follow with you.    LOS: 1 day    Subjective: CC: LUQ abdominal pain  Pain improved and fevers have resolved. Pt states she is feeling much better. Her husband is at bedside. No nausea or vomiting overnight. No new complaints. She asked me to speak briefly with her PCP, Ms. Eulas Post, PA on the phone. We discussed that we are waiting on IR and GI to see pt.   Objective: Vital signs in last 24 hours: Temp:  [97.6 F (36.4 C)-102.4 F (39.1 C)] 97.6 F (36.4 C) (03/13 0534) Pulse Rate:  [59-88] 63 (03/13 0534) Resp:  [15-30] 18 (03/13 0534) BP: (91-125)/(47-79) 118/78 (03/13 0534) SpO2:  [94 %-100 %] 99 % (03/13 0534) Weight:  [154 lb 3.2 oz (69.9 kg)] 154 lb 3.2 oz (69.9 kg) (03/12 1043) Last BM Date: 12/02/17  Intake/Output from previous day: 03/12 0701 - 03/13 0700 In: 850 [I.V.:500; IV Piggyback:350] Out: -  Intake/Output this shift: No intake/output data recorded.  PE: Gen:  Alert, NAD, pleasant, cooperative Card:  RRR, no M/G/R heard Pulm:   Rate and  effort normal Abd: Soft, not distended, +BS, well-healed midline incision. Good bowel sounds.  She is mildly tender in the left upper quadrant.  She does not have peritonitis. Skin: no rashes noted, warm and dry, mild jaundice   Anti-infectives: Anti-infectives (From admission, onward)   Start     Dose/Rate Route Frequency Ordered Stop   12/02/17 2200  vancomycin (VANCOCIN) 500 mg in sodium chloride 0.9 % 100 mL IVPB     500 mg 100 mL/hr over 60 Minutes Intravenous Every 12 hours 12/02/17 1726     12/02/17 2100  piperacillin-tazobactam (ZOSYN) IVPB 3.375 g     3.375 g 12.5 mL/hr over 240 Minutes Intravenous Every 8 hours 12/02/17 1724     12/02/17 1330  piperacillin-tazobactam (ZOSYN) IVPB 3.375 g  Status:  Discontinued     3.375 g 100 mL/hr over 30 Minutes Intravenous  Once 12/02/17 1323 12/02/17 1326   12/02/17 1330  vancomycin (VANCOCIN) IVPB 1000 mg/200 mL premix     1,000 mg 200 mL/hr over 60 Minutes Intravenous  Once 12/02/17 1323 12/02/17 1445      Lab Results:  Recent Labs    12/02/17 1006 12/03/17 0515  WBC 9.9 7.8  HGB  --  8.6*  HCT 31.6* 25.3*  PLT 241 235   BMET Recent Labs    12/02/17 1006 12/03/17 0515  NA 129* 139  K 4.0 3.4*  CL 96* 108  CO2 23 24  GLUCOSE 132 104*  BUN 8 6  CREATININE 0.76 0.42*  CALCIUM 9.1 8.1*   PT/INR  Recent Labs    12/02/17 1928  LABPROT 19.8*  INR 1.70   CMP     Component Value Date/Time   NA 139 12/03/2017 0515   NA 138 06/03/2017 0937   K 3.4 (L) 12/03/2017 0515   K 4.0 06/03/2017 0937   CL 108 12/03/2017 0515   CO2 24 12/03/2017 0515   CO2 24 06/03/2017 0937   GLUCOSE 104 (H) 12/03/2017 0515   GLUCOSE 111 06/03/2017 0937   BUN 6 12/03/2017 0515   BUN 13.0 06/03/2017 0937   CREATININE 0.42 (L) 12/03/2017 0515   CREATININE 0.76 12/02/2017 1006   CREATININE 0.8 06/03/2017 0937   CALCIUM 8.1 (L) 12/03/2017 0515   CALCIUM 9.2 06/03/2017 0937   PROT 5.8 (L) 12/03/2017 0515   PROT 6.9 06/03/2017 0937    ALBUMIN 2.4 (L) 12/03/2017 0515   ALBUMIN 3.6 06/03/2017 0937   AST 120 (H) 12/03/2017 0515   AST 114 (H) 12/02/2017 1006   AST 55 (H) 06/03/2017 0937   ALT 57 (H) 12/03/2017 0515   ALT 60 (H) 12/02/2017 1006   ALT 38 06/03/2017 0937   ALKPHOS 566 (H) 12/03/2017 0515   ALKPHOS 142 06/03/2017 0937   BILITOT 4.6 (H) 12/03/2017 0515   BILITOT 3.4 (H) 12/02/2017 1006   BILITOT 0.56 06/03/2017 0937   GFRNONAA >60 12/03/2017 0515   GFRNONAA >60 12/02/2017 1006   GFRAA >60 12/03/2017 0515   GFRAA >60 12/02/2017 1006   Lipase     Component Value Date/Time   LIPASE 19 12/02/2017 1325   LIPASE 5 (L) 06/26/2017 1446    Studies/Results: Dg Chest 2 View  Result Date: 12/02/2017 CLINICAL DATA:  Flu like symptoms EXAM: CHEST - 2 VIEW COMPARISON:  Chest CT 01/25/2017 and CXR 01/25/2017 FINDINGS: The heart size and mediastinal contours are within normal limits. Chronic mild interstitial prominence without alveolar consolidation, dominant mass, effusion or pneumothorax. Left-sided port catheter is noted with tip in the mid SVC. No acute osseous abnormality. Partially visualized left shoulder arthroplasty. IMPRESSION: No active cardiopulmonary disease. Electronically Signed   By: Ashley Royalty M.D.   On: 12/02/2017 17:16   Ct Abdomen Pelvis W Contrast  Result Date: 12/02/2017 CLINICAL DATA:  Acute abdominal pain. Hypotension. Fever/chills. Pancreatic cancer diagnosed 2015. Endometrial cancer diagnosed 2012. EXAM: CT ABDOMEN AND PELVIS WITH CONTRAST TECHNIQUE: Multidetector CT imaging of the abdomen and pelvis was performed using the standard protocol following bolus administration of intravenous contrast. CONTRAST:  115mL ISOVUE-300 IOPAMIDOL (ISOVUE-300) INJECTION 61% COMPARISON:  11/18/2017. FINDINGS: Lower chest: Lung bases are clear. Hepatobiliary: No suspicious/enhancing hepatic lesions. Status post Whipple procedure with choledochojejunostomy. Mild intrahepatic and extrahepatic ductal dilatation.  Dilated common duct, measuring 14 mm centrally (coronal image 44, previously 11 mm. Pancreas: Status post Whipple procedure with pancreaticojejunostomy. Atrophy of the residual pancreatic body/tail with mild pancreatic duct dilatation (series 2/image 24). Spleen: Within normal limits. Adrenals/Urinary Tract: Adrenal glands within normal limits. 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 Left kidney is within normal limits. No hydronephrosis. Bladder is within normal limits. Stomach/Bowel: Status post partial gastrectomy with gastrojejunostomy. Mildly dilated afferent limb in the right upper quadrant (series 2/image 29), although with improved wall thickening when compared to the prior CT. No evidence of bowel obstruction. Appendix is not discretely visualized. Vascular/Lymphatic: No evidence of abdominal aortic aneurysm. Atherosclerotic calcifications of the abdominal aorta and branch vessels. No suspicious abdominopelvic lymphadenopathy. Reproductive: Status post hysterectomy. No adnexal masses. Other: No abdominopelvic ascites. Musculoskeletal: Mild superior endplate Schmorl's node deformities at T10, T11, and  L5. IMPRESSION: Mildly progressive intrahepatic and extrahepatic ductal dilatation, as described above. Associated dilatation of the afferent limb. 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 GI/surgical consultation is suggested. No findings specific for recurrent or metastatic disease in this patient with history of pancreatic cancer and endometrial cancer. Electronically Signed   By: Julian Hy M.D.   On: 12/02/2017 15:13   Mr 3d Recon At Scanner  Result Date: 12/03/2017 CLINICAL DATA:  Evaluate for pancreatitis. History of pancreas cancer. Now with ongoing abdominal pain. Progressive biliary dilatation. EXAM: MRI ABDOMEN WITHOUT AND WITH CONTRAST (INCLUDING MRCP) TECHNIQUE: Multiplanar multisequence MR imaging of the abdomen was performed both  before and after the administration of intravenous contrast. Heavily T2-weighted images of the biliary and pancreatic ducts were obtained, and three-dimensional MRCP images were rendered by post processing. CONTRAST:  78mL MULTIHANCE GADOBENATE DIMEGLUMINE 529 MG/ML IV SOLN COMPARISON:  12/02/2017 FINDINGS: Lower chest: No acute findings. Hepatobiliary: Diffusely heterogeneous enhancement pattern of the liver is identified. There is a focal area of increased T2 signal within the region of the caudate lobe of liver which measures 2.4 by 2.5 cm, image 23/3. This demonstrates relative increased enhancement compared with the remainder of the liver, image 48/1301. Within the remaining portions of the liver there is heterogeneous attenuation and enhancement. There is marked intrahepatic bile duct dilatation. This demonstrates significant progression when compared with 11/18/17. Pancreas: Residual pancreas is largely atrophic with dilatation of the main duct, image 59/1303. The main duct measures up to 7 mm. Spleen:  Within normal limits in size and appearance. Adrenals/Urinary Tract: The adrenal glands are normal. Unremarkable appearance of the kidneys. Stomach/Bowel: The stomach and the remaining small bowel loops are unremarkable. Dilatation of the hepaticojejunostomy loop is identified. There appears to be marked narrowing of the hepaticojejunostomy loop at the point at which the crosses the midline (from right to left) between the aorta and SMV, image 72/1303. Findings may reflect underlying postsurgical scarring. Recurrent tumor not excluded. Vascular/Lymphatic:  Normal appearance of the abdominal aorta. Other:  No ascites or focal fluid collections identified. Musculoskeletal: No suspicious bone lesions identified. IMPRESSION: 1. Postoperative changes from Whipple procedure again noted. On today's study there is marked and progressive dilatation of the hepaticojejunostomy loop, bile ducts and main pancreatic duct.  Focal area of narrowing of the hepaticojejunostomy loop as it crosses the midline from right to left. Findings may represent underlying postsurgical scarring and stricture formation versus recurrent tumor. 2. There is a focal area of abnormal signal in the region of the caudate lobe of liver with increased enhancement. It is unclear whether not this is abnormal signal and enhancement within the liver parenchyma or adenopathy. A PET-CT may be helpful to assess for recurrent metabolically active tumor. Electronically Signed   By: Kerby Moors M.D.   On: 12/03/2017 08:37   Mr Abdomen Mrcp Moise Boring Contast  Result Date: 12/03/2017 CLINICAL DATA:  Evaluate for pancreatitis. History of pancreas cancer. Now with ongoing abdominal pain. Progressive biliary dilatation. EXAM: MRI ABDOMEN WITHOUT AND WITH CONTRAST (INCLUDING MRCP) TECHNIQUE: Multiplanar multisequence MR imaging of the abdomen was performed both before and after the administration of intravenous contrast. Heavily T2-weighted images of the biliary and pancreatic ducts were obtained, and three-dimensional MRCP images were rendered by post processing. CONTRAST:  75mL MULTIHANCE GADOBENATE DIMEGLUMINE 529 MG/ML IV SOLN COMPARISON:  12/02/2017 FINDINGS: Lower chest: No acute findings. Hepatobiliary: Diffusely heterogeneous enhancement pattern of the liver is identified. There is a focal  area of increased T2 signal within the region of the caudate lobe of liver which measures 2.4 by 2.5 cm, image 23/3. This demonstrates relative increased enhancement compared with the remainder of the liver, image 48/1301. Within the remaining portions of the liver there is heterogeneous attenuation and enhancement. There is marked intrahepatic bile duct dilatation. This demonstrates significant progression when compared with 11/18/17. Pancreas: Residual pancreas is largely atrophic with dilatation of the main duct, image 59/1303. The main duct measures up to 7 mm. Spleen:  Within  normal limits in size and appearance. Adrenals/Urinary Tract: The adrenal glands are normal. Unremarkable appearance of the kidneys. Stomach/Bowel: The stomach and the remaining small bowel loops are unremarkable. Dilatation of the hepaticojejunostomy loop is identified. There appears to be marked narrowing of the hepaticojejunostomy loop at the point at which the crosses the midline (from right to left) between the aorta and SMV, image 72/1303. Findings may reflect underlying postsurgical scarring. Recurrent tumor not excluded. Vascular/Lymphatic:  Normal appearance of the abdominal aorta. Other:  No ascites or focal fluid collections identified. Musculoskeletal: No suspicious bone lesions identified. IMPRESSION: 1. Postoperative changes from Whipple procedure again noted. On today's study there is marked and progressive dilatation of the hepaticojejunostomy loop, bile ducts and main pancreatic duct. Focal area of narrowing of the hepaticojejunostomy loop as it crosses the midline from right to left. Findings may represent underlying postsurgical scarring and stricture formation versus recurrent tumor. 2. There is a focal area of abnormal signal in the region of the caudate lobe of liver with increased enhancement. It is unclear whether not this is abnormal signal and enhancement within the liver parenchyma or adenopathy. A PET-CT may be helpful to assess for recurrent metabolically active tumor. Electronically Signed   By: Kerby Moors M.D.   On: 12/03/2017 08:37   US Abdomen Limited Ruq  Result Date: 12/02/2017 CLINICAL DATA:  Abdominal pain.  History of pancreatic carcinoma EXAM: ULTRASOUND ABDOMEN LIMITED RIGHT UPPER QUADRANT COMPARISON:  CT abdomen and pelvis November 18, 2017 FINDINGS: Gallbladder: Surgically absent. Common bile duct: Diameter: 11 mm, prominent. No biliary duct mass or calculus is appreciable by ultrasound. Liver: No focal lesion identified. Liver echogenicity overall is increased.  There is intrahepatic biliary duct dilatation. Portal vein is patent on color Doppler imaging with normal direction of blood flow towards the liver. IMPRESSION: 1. Gallbladder absent. There is biliary duct dilatation, similar to recent CT. No biliary duct mass or calculus evident. 2. Diffuse increase in liver echogenicity, a finding felt to be indicative of hepatic steatosis. While no focal liver lesions are evident, it must be cautioned that the sensitivity of ultrasound for detection of focal liver lesions is diminished significantly in this circumstance. Electronically Signed   By: Lowella Grip III M.D.   On: 12/02/2017 14:19      Kalman Drape , [facility] 12/03/2017, 9:13 AM  Pager: [phone] Mon-Wed, Friday 7:00am-4:30pm Thurs 7am-11:30am  Consults: [phone]
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
## 2017-12-04 ENCOUNTER — Other Ambulatory Visit: Payer: Medicare Other

## 2017-12-04 ENCOUNTER — Ambulatory Visit: Payer: Medicare Other | Admitting: Nurse Practitioner

## 2017-12-04 LAB — COMPREHENSIVE METABOLIC PANEL
ALK PHOS: 564 U/L — AB (ref 38–126)
ALT: 50 U/L (ref 14–54)
AST: 102 U/L — AB (ref 15–41)
Albumin: 2.5 g/dL — ABNORMAL LOW (ref 3.5–5.0)
Anion gap: 8 (ref 5–15)
BUN: 6 mg/dL (ref 6–20)
CHLORIDE: 107 mmol/L (ref 101–111)
CO2: 24 mmol/L (ref 22–32)
CREATININE: 0.52 mg/dL (ref 0.44–1.00)
Calcium: 8.3 mg/dL — ABNORMAL LOW (ref 8.9–10.3)
GFR calc non Af Amer: >60 mL/min (ref >60)
GLUCOSE: 69 mg/dL (ref 65–99)
Potassium: 3.5 mmol/L (ref 3.5–5.1)
SODIUM: 139 mmol/L (ref 135–145)
Total Bilirubin: 2.7 mg/dL — ABNORMAL HIGH (ref 0.3–1.2)
Total Protein: 5.7 g/dL — ABNORMAL LOW (ref 6.5–8.1)

## 2017-12-04 LAB — CBC WITH DIFFERENTIAL/PLATELET
BASOS ABS: 0 10*3/uL (ref 0.0–0.1)
Basophils Relative: 0 %
EOS ABS: 0.4 10*3/uL (ref 0.0–0.7)
Eosinophils Relative: 6 %
HCT: 24.5 % — ABNORMAL LOW (ref 36.0–46.0)
HEMOGLOBIN: 8.4 g/dL — AB (ref 12.0–15.0)
LYMPHS ABS: 1.4 10*3/uL (ref 0.7–4.0)
LYMPHS PCT: 24 %
MCH: 30.4 pg (ref 26.0–34.0)
MCHC: 34.3 g/dL (ref 30.0–36.0)
MCV: 88.8 fL (ref 78.0–100.0)
Monocytes Absolute: 0.4 10*3/uL (ref 0.1–1.0)
Monocytes Relative: 7 %
NEUTROS PCT: 63 %
Neutro Abs: 3.5 10*3/uL (ref 1.7–7.7)
Platelets: 282 10*3/uL (ref 150–400)
RBC: 2.76 MIL/uL — AB (ref 3.87–5.11)
RDW: 14.9 % (ref 11.5–15.5)
WBC: 5.7 10*3/uL (ref 4.0–10.5)

## 2017-12-04 LAB — MAGNESIUM: Magnesium: 2.1 mg/dL (ref 1.7–2.4)

## 2017-12-04 LAB — PROTIME-INR
INR: 1.47
Prothrombin Time: 17.7 seconds — ABNORMAL HIGH (ref 11.4–15.2)

## 2017-12-04 LAB — PHOSPHORUS: Phosphorus: 2.7 mg/dL (ref 2.5–4.6)

## 2017-12-04 MED ORDER — POTASSIUM CHLORIDE 10 MEQ/100ML IV SOLN
10.0000 meq | INTRAVENOUS | Status: AC
  Administered 2017-12-04 (×3): 10 meq via INTRAVENOUS
  Filled 2017-12-04 (×3): qty 100

## 2017-12-04 MED ORDER — METOPROLOL SUCCINATE ER 25 MG PO TB24
12.5000 mg | ORAL_TABLET | Freq: Every day | ORAL | Status: DC
  Administered 2017-12-04 – 2017-12-09 (×6): 12.5 mg via ORAL
  Filled 2017-12-04 (×6): qty 1

## 2017-12-04 NOTE — Progress Notes (Signed)
These preliminary result these preliminary results were noted.  Awaiting final report.

## 2017-12-04 NOTE — Progress Notes (Signed)
PROGRESS NOTE    Raiyah S Amaker  MRN:[id number] DOB: 01/21/1946 DOA: 12/02/2017 PCP: Badger, Michael C, MD   Brief Narrative:  HPI Dr. Krishnan:  Rolande S Haug is a 72 y.o. 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  Presented with abdominal pain ongoing for 2-3 weeks. It was 7 out of 10 in intensity.  Sharp pain in the upper abdomen without any radiation.  No precipitating factors identified.  No aggravating or relieving factors.  She was seen at the cancer center.  It appears that she is been having the symptoms since the end of February.  At that time oncology was consider referring the patient to gastroenterology.  Patient was diagnosed with sinusitis and was placed on oral antibiotics but she developed diarrhea.  She saw her primary care physician yesterday and took a stool sample which was sent for C. difficile. Those results are not available yet.  But she stopped taking her antibiotics and her diarrhea has resolved.  Her abdominal pain however continues.  She has had some nausea but no vomiting.  Denies any facial pain.  Denies skin rashes shortness of breath cough.  Denies any joint pains.  Some pressure with urinating but denies any burning sensation.  Evaluation in the emergency department reveals that patient is febrile.  Patient was noted to have borderline low blood pressures.  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  Patient will need hospitalization for further management.  There is concern for sepsis.  *Patient still having abdominal Pain. Patient to undergo PTC and Biliary Drain placement today. Will defer to GI for timing of EGD.  Assessment & Plan:   Principal  Problem:   Fever Active Problems:   Abdominal pain   Hyponatremia   Adenocarcinoma of head of pancreas (HCC)   Chronic anticoagulation   Anemia   Chronic atrial fibrillation (HCC)   Hypotension   Sepsis (HCC)  Sepsis likely from Cholangitis with GNR Bacteremia -Etiology unclear but possibly GI/biliary in origin.   -Blood Cx Showed GNR in Anaerobic Bottle Only but BCID showed none detected; Await Final Result  -Considering her abnormal LFTs and findings and CT scan cholangitis and obstructive duct needs to be considered.  Follow-up on culture data.   -D/C'd Vancomycin given GNR and will continue for now Zosyn for now.   -Lactic acid level is reassuringly normal and went from 1.41 -> 1.0 -Pro-calcitonin was 4.09. -Patient's Fever has improved and she has no White Count and WBC is 5.8 -IVF Rehydration with NS at 125 mL/hr now stopped  -Sepsis physiology improving   Abdominal Pain -CT scan showed Mildly progressive intrahepatic and extrahepatic ductal dilatation, as described above. Associated dilatation of the afferent limb. 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  -General Surgery has been consulted and recommended GI consult to eval the Afferent Limb and IR to see if they can decompress the biliary system with transhepatic drain -Gastroenterology consulted and MRCP done.See below.  -IR Consulted and tentatively planning on Percutaneous Transhepatic Cholangiogram and  Biliary Drain Placement today  Abnormal liver function tests including elevated alkaline phosphatase, bilirubin and transaminases -AST went from 114 -> 120 -> 102 and ALT went from 60 -> 57 -> 50 -  As discussed above.  -MRCP showed Postoperative changes from Whipple procedure again noted. On today's study there is marked and progressive dilatation of the hepaticojejunostomy loop, bile ducts and main pancreatic duct. Focal area of narrowing of the  hepaticojejunostomy loop as it crosses the midline from right to left. Findings may represent underlying postsurgical scarring and stricture formation versus recurrent Tumor. There was also a focal area of abnormal signal in the region of the caudate lobe of liver with increased enhancement. It is unclear whether not this is abnormal signal and enhancement within the liver parenchyma or adenopathy -Gastroenterology Consulted for further evaluation and treatment and recommending C/w IV Zosyn and Vancomycin; Full Liquid Deit and NPO after midnight -GI recommending IR Consult and  -IR consulted and will likely undergo Percutaneous Drainage of the Bile Duct today; Last dose of Xarelto 12/01/17.  -There will be a possible EGD as well to access the hepaticojejunostomy / Afferent loop for view of ? Biopsy of stricture area and further ascertain etiology of obsturtion.   Hypotension -She is asymptomatic and BP is improving and BP was 124/70 this AM  -D/C'd IVF Rehydration .  Hyponatremia -Improved. Was likely from Hypovolemia -Patient's Na+ went from 129 -> 139 -D/C'd  NS at 125 mL/hr for 24 hours -Continue to Monitor  -Repeat CMP in AM   Normocytic Anemia -Likely Hemo-concentrated on Admission and likley dilutional Drop now  -Likely Anemia of chronic disease.  No overt blood loss noted. -Hb/Hct went from 10.6/31.6 -> 8.6/25.3 ->8.4/24.5 -Continue to Monitor for S/Sx of Bleeding as she was previously Anticoagulated with Xarelto  -Repeat CBC in AM   Recent Diarrhea, improved  -Probably due to antibiotics.   -Diarrhea appears to have resolved.  She tells me that she took a stool sample to her PCP yesterday. Those results are not available to [place] yet.  But her diarrhea has resolved.   We will continue to monitor for no and doubt it is C Difficile given no WBC.Marland Kitchen  History of Pancreatic Cancer -Followed by Oncology Dr. Julieanne Manson.  -Patient's CA 19-9 starting to elevate -MRCP showed  Postoperative changes from Whipple procedure again noted. On today's study there is marked and progressive dilatation of the hepaticojejunostomy loop, bile ducts and main pancreatic duct. Focal area of narrowing of the hepaticojejunostomy loop as it crosses the midline from right to left. Findings may represent underlying postsurgical scarring and stricture formation versus recurrent tumor. -Not on Chemotherapy currently.   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  -Pain control as before.  History of Paroxysmal Atrial Fibrillation -She is on Flecainide which will be continued at 100 mg po BID.   -Will resume her Home Metoprolol XL 12.5 mg po Daily  -She is on Xarelto which will be held in case the patient needs to undergo procedure.  Hypokalemia -Patient's K+ was 3.5 this AM -Replete with IV KCl 30 mEQ again   -Continue to Monitor and Replete as Necessary -Repeat CMP in AM   Hyperbilirubinemia -Improving. T Bili went from 4.6 -> 2.7 -As above -Repeat CMP in AM   DVT prophylaxis: SCDs  Code Status: DO NOT RESUSCITATE  Family Communication: Discussed with Husband at bedside  Disposition Plan: Remain Inpatient   Consultants:   General Surgery  Gastroenterology  Oncology   IR  Procedures: Pertcutaneous Transhepatic Cholangiogram with Biliary Drain to be done today    Antimicrobials:  Anti-infectives (From admission, onward)   Start     Dose/Rate Route Frequency Ordered Stop  12/02/17 2200  vancomycin (VANCOCIN) 500 mg in sodium chloride 0.9 % 100 mL IVPB  Status:  Discontinued     500 mg 100 mL/hr over 60 Minutes Intravenous Every 12 hours 12/02/17 1726 12/03/17 1857   12/02/17 2100  piperacillin-tazobactam (ZOSYN) IVPB 3.375 g     3.375 g 12.5 mL/hr over 240 Minutes Intravenous Every 8 hours 12/02/17 1724     12/02/17 1330  piperacillin-tazobactam (ZOSYN) IVPB 3.375 g  Status:  Discontinued     3.375 g 100 mL/hr over 30 Minutes Intravenous   Once 12/02/17 1323 12/02/17 1326   12/02/17 1330  vancomycin (VANCOCIN) IVPB 1000 mg/200 mL premix     1,000 mg 200 mL/hr over 60 Minutes Intravenous  Once 12/02/17 1323 12/02/17 1445     Subjective: Seen and examined and was resting. Still having abdominal pain that was sharp. No Nausea or vomiting. No lightheadedness or dizziness. No other concerns or complaints at this time.   Objective: Vitals:   12/03/17 0534 12/03/17 1417 12/03/17 2238 12/04/17 0356  BP: 118/78 124/63 118/62 124/70  Pulse: 63 71 70 70  Resp: _0 Temp: 97.6 F (36.4 C) 97.9 F (36.6 C) 97.9 F (36.6 C) 97.7 F (36.5 C)  TempSrc: Oral Oral Oral Oral  SpO2: 99% 100% 99% 100%    Intake/Output Summary (Last 24 hours) at 12/04/2017 1232 Last data filed at 12/04/2017 0600 Gross per 24 hour  Intake 250 ml  Output 3 ml  Net 247 ml   There were no vitals filed for this visit.  Examination: Physical Exam:  Constitutional: Caucasian female in NAD appears calm  Eyes: Sclerae anicteric. Lids normal ENMT: External Ears and nose appear normal. Grossly normal hearing Neck: Supple with no JVD Respiratory:  Diminished to auscultation; No appreciable wheezing/rales/rhonchi. Unlabored breathing Cardiovascular: RRR: No appreciable m/r/g. No extremity edema Abdomen: Soft, Tender to palpate especially on Left and mid-epigastric region. ND. Bowel sounds present GU: Deferred Musculoskeletal: No contractures; No cyanosis Skin: Warm and dry; No appreciable rashes or lesions Neurologic: CN 2-12 grossly intact. No appreciable focal deficits Psychiatric: Normal mood and affect. Intact judgement and insight  Data Reviewed: I have personally reviewed following labs and imaging studies  CBC: Recent Labs  Lab 12/02/17 1006 12/03/17 0515 12/04/17 0353  WBC 9.9 7.8 5.7  NEUTROABS 8.7*  --  3.5  HGB  --  8.6* 8.4*  HCT 31.6* 25.3* 24.5*  MCV 88.0 86.9 88.8  PLT 241 235 060   Basic Metabolic Panel: Recent Labs    Lab 12/02/17 1006 12/03/17 0515 12/04/17 0353  NA 129* 139 139  K 4.0 3.4* 3.5  CL 96* 108 107  CO2 _1 GLUCOSE 132 104* 69  BUN _2 CREATININE 0.76 0.42* 0.52  CALCIUM 9.1 8.1* 8.3*  MG  --  2.0 2.1  PHOS  --  3.0 2.7   GFR: Estimated Creatinine Clearance: 61.4 mL/min (by C-G formula based on SCr of 0.52 mg/dL). Liver Function Tests: Recent Labs  Lab 12/02/17 1006 12/03/17 0515 12/04/17 0353  AST 114* 120* 102*  ALT 60* 57* 50  ALKPHOS 831* 566* 564*  BILITOT 3.4* 4.6* 2.7*  PROT 7.1 5.8* 5.7*  ALBUMIN 2.9* 2.4* 2.5*   Recent Labs  Lab 12/02/17 1325  LIPASE 19   No results for input(s): AMMONIA in the last 168 hours. Coagulation Profile: Recent Labs  Lab 12/02/17 1928 12/04/17 0353  INR 1.70 1.47   Cardiac Enzymes: No  results for input(s): CKTOTAL, CKMB, CKMBINDEX, TROPONINI in the last 168 hours. BNP (last 3 results) No results for input(s): PROBNP in the last 8760 hours. HbA1C: No results for input(s): HGBA1C in the last 72 hours. CBG: No results for input(s): GLUCAP in the last 168 hours. Lipid Profile: No results for input(s): CHOL, HDL, LDLCALC, TRIG, CHOLHDL, LDLDIRECT in the last 72 hours. Thyroid Function Tests: No results for input(s): TSH, T4TOTAL, FREET4, T3FREE, THYROIDAB in the last 72 hours. Anemia Panel: No results for input(s): VITAMINB12, FOLATE, FERRITIN, TIBC, IRON, RETICCTPCT in the last 72 hours. Sepsis Labs: Recent Labs  Lab 12/02/17 1344 12/02/17 1928 12/02/17 2323  PROCALCITON  --  4.09  --   LATICACIDVEN 1.41 1.0 1.0    Recent Results (from the past 240 hour(s))  Urine Culture     Status: None   Collection Time: 12/02/17 10:04 AM  Result Value Ref Range Status   Specimen Description   Final    URINE, CLEAN CATCH Performed at [facility], [place] [address] [place], [place] [address]    Special Requests   Final    NONE Performed at [facility], [place]  [address], [place], [place] [address]    Culture   Final    NO GROWTH Performed at [facility], [place] [address] [place], [place] [address]    Report Status 12/03/2017 FINAL  Final  Culture, Blood     Status: None (Preliminary result)   Collection Time: 12/02/17 11:39 AM  Result Value Ref Range Status   Specimen Description BLOOD RIGHT ARM  Final   Special Requests   Final    BOTTLES DRAWN AEROBIC AND ANAEROBIC Blood Culture results may not be optimal due to an excessive volume of blood received in culture bottles   Culture  Setup Time   Final    GRAM NEGATIVE RODS ANAEROBIC BOTTLE ONLY CRITICAL RESULT CALLED TO, READ BACK BY AND VERIFIED WITHUlice Bold PHARMD 1801 12/03/17 A BROWNING Performed at [facility], [place] [address] [place], [place] [address]    Culture PENDING  Incomplete   Report Status PENDING  Incomplete  Blood Culture ID Panel (Reflexed)     Status: None   Collection Time: 12/02/17 11:39 AM  Result Value Ref Range Status   Enterococcus species NOT DETECTED NOT DETECTED Final   Listeria monocytogenes NOT DETECTED NOT DETECTED Final   Staphylococcus species NOT DETECTED NOT DETECTED Final   Staphylococcus aureus NOT DETECTED NOT DETECTED Final   Streptococcus species NOT DETECTED NOT DETECTED Final   Streptococcus agalactiae NOT DETECTED NOT DETECTED Final   Streptococcus pneumoniae NOT DETECTED NOT DETECTED Final   Streptococcus pyogenes NOT DETECTED NOT DETECTED Final   Acinetobacter baumannii NOT DETECTED NOT DETECTED Final   Enterobacteriaceae species NOT DETECTED NOT DETECTED Final   Enterobacter cloacae complex NOT DETECTED NOT DETECTED Final   Escherichia coli NOT DETECTED NOT DETECTED Final   Klebsiella oxytoca NOT DETECTED NOT DETECTED Final   Klebsiella pneumoniae NOT DETECTED NOT DETECTED Final   Proteus species NOT DETECTED NOT DETECTED Final   Serratia marcescens NOT DETECTED NOT DETECTED Final   Haemophilus influenzae NOT DETECTED NOT  DETECTED Final   Neisseria meningitidis NOT DETECTED NOT DETECTED Final   Pseudomonas aeruginosa NOT DETECTED NOT DETECTED Final   Candida albicans NOT DETECTED NOT DETECTED Final   Candida glabrata NOT DETECTED NOT DETECTED Final   Candida krusei NOT DETECTED NOT DETECTED Final  Candida parapsilosis NOT DETECTED NOT DETECTED Final   Candida tropicalis NOT DETECTED NOT DETECTED Final    Comment: Performed at [facility], [address] [place], [place] [address]  Culture, Blood     Status: None (Preliminary result)   Collection Time: 12/02/17 11:50 AM  Result Value Ref Range Status   Specimen Description BLOOD PORTA CATH  Final   Special Requests   Final    BOTTLES DRAWN AEROBIC AND ANAEROBIC Blood Culture adequate volume   Culture   Final    NO GROWTH < 24 HOURS Performed at [facility], [address] [place], [place] [address]    Report Status PENDING  Incomplete  Blood Culture (routine x 2)     Status: None (Preliminary result)   Collection Time: 12/02/17  1:27 PM  Result Value Ref Range Status   Specimen Description   Final    BLOOD LEFT PORTA CATH Performed at [facility], [address] [place], [place] [address]    Special Requests   Final    BOTTLES DRAWN AEROBIC AND ANAEROBIC Blood Culture adequate volume Performed at [facility], [address] [place], [place] [address]    Culture   Final    NO GROWTH < 24 HOURS Performed at [facility], [address] [place], [place] [address]    Report Status PENDING  Incomplete  Respiratory Panel by PCR     Status: None   Collection Time: 12/02/17  3:40 PM  Result Value Ref Range Status   Adenovirus NOT DETECTED NOT DETECTED Final   Coronavirus 229E NOT DETECTED NOT DETECTED Final   Coronavirus HKU1 NOT DETECTED NOT DETECTED Final   Coronavirus NL63 NOT DETECTED NOT DETECTED Final   Coronavirus OC43 NOT DETECTED NOT DETECTED Final   Metapneumovirus NOT  DETECTED NOT DETECTED Final   Rhinovirus / Enterovirus NOT DETECTED NOT DETECTED Final   Influenza A NOT DETECTED NOT DETECTED Final   Influenza B NOT DETECTED NOT DETECTED Final   Parainfluenza Virus 1 NOT DETECTED NOT DETECTED Final   Parainfluenza Virus 2 NOT DETECTED NOT DETECTED Final   Parainfluenza Virus 3 NOT DETECTED NOT DETECTED Final   Parainfluenza Virus 4 NOT DETECTED NOT DETECTED Final   Respiratory Syncytial Virus NOT DETECTED NOT DETECTED Final   Bordetella pertussis NOT DETECTED NOT DETECTED Final   Chlamydophila pneumoniae NOT DETECTED NOT DETECTED Final   Mycoplasma pneumoniae NOT DETECTED NOT DETECTED Final    Comment: Performed at  Hospital [facility], [address] [place], [place] [address]    Radiology Studies: Dg Chest 2 View  Result Date: 12/02/2017 CLINICAL DATA:  Flu like symptoms EXAM: CHEST - 2 VIEW COMPARISON:  Chest CT 01/25/2017 and CXR 01/25/2017 FINDINGS: The heart size and mediastinal contours are within normal limits. Chronic mild interstitial prominence without alveolar consolidation, dominant mass, effusion or pneumothorax. Left-sided port catheter is noted with tip in the mid SVC. No acute osseous abnormality. Partially visualized left shoulder arthroplasty. IMPRESSION: No active cardiopulmonary disease. Electronically Signed   By: David  Kwon M.D.   On: 12/02/2017 17:16   Ct Abdomen Pelvis W Contrast  Result Date: 12/02/2017 CLINICAL DATA:  Acute abdominal pain. Hypotension. Fever/chills. Pancreatic cancer diagnosed 2015. Endometrial cancer diagnosed 2012. EXAM: CT ABDOMEN AND PELVIS WITH CONTRAST TECHNIQUE: Multidetector CT imaging of the abdomen and pelvis was performed using the standard protocol following bolus administration of intravenous contrast. CONTRAST:  100mL ISOVUE-300 IOPAMIDOL (ISOVUE-300) INJECTION   61% COMPARISON:  11/18/2017. FINDINGS: Lower chest: Lung bases are clear. Hepatobiliary: No suspicious/enhancing hepatic lesions. Status post  Whipple procedure with choledochojejunostomy. Mild intrahepatic and extrahepatic ductal dilatation. Dilated common duct, measuring 14 mm centrally (coronal image 44, previously 11 mm. Pancreas: Status post Whipple procedure with pancreaticojejunostomy. Atrophy of the residual pancreatic body/tail with mild pancreatic duct dilatation (series 2/image 24). Spleen: Within normal limits. Adrenals/Urinary Tract: Adrenal glands within normal limits. 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 Left kidney is within normal limits. No hydronephrosis. Bladder is within normal limits. Stomach/Bowel: Status post partial gastrectomy with gastrojejunostomy. Mildly dilated afferent limb in the right upper quadrant (series 2/image 29), although with improved wall thickening when compared to the prior CT. No evidence of bowel obstruction. Appendix is not discretely visualized. Vascular/Lymphatic: No evidence of abdominal aortic aneurysm. Atherosclerotic calcifications of the abdominal aorta and branch vessels. No suspicious abdominopelvic lymphadenopathy. Reproductive: Status post hysterectomy. No adnexal masses. Other: No abdominopelvic ascites. Musculoskeletal: Mild superior endplate Schmorl's node deformities at T10, T11, and L5. IMPRESSION: Mildly progressive intrahepatic and extrahepatic ductal dilatation, as described above. Associated dilatation of the afferent limb. 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 GI/surgical consultation is suggested. No findings specific for recurrent or metastatic disease in this patient with history of pancreatic cancer and endometrial cancer. Electronically Signed   By: Julian Hy M.D.   On: 12/02/2017 15:13   Mr 3d Recon At Scanner  Result Date: 12/03/2017 CLINICAL DATA:  Evaluate for pancreatitis. History of pancreas cancer. Now with ongoing abdominal pain. Progressive biliary dilatation. EXAM: MRI ABDOMEN WITHOUT AND WITH CONTRAST  (INCLUDING MRCP) TECHNIQUE: Multiplanar multisequence MR imaging of the abdomen was performed both before and after the administration of intravenous contrast. Heavily T2-weighted images of the biliary and pancreatic ducts were obtained, and three-dimensional MRCP images were rendered by post processing. CONTRAST:  61m MULTIHANCE GADOBENATE DIMEGLUMINE 529 MG/ML IV SOLN COMPARISON:  12/02/2017 FINDINGS: Lower chest: No acute findings. Hepatobiliary: Diffusely heterogeneous enhancement pattern of the liver is identified. There is a focal area of increased T2 signal within the region of the caudate lobe of liver which measures 2.4 by 2.5 cm, image 23/3. This demonstrates relative increased enhancement compared with the remainder of the liver, image 48/1301. Within the remaining portions of the liver there is heterogeneous attenuation and enhancement. There is marked intrahepatic bile duct dilatation. This demonstrates significant progression when compared with 11/18/17. Pancreas: Residual pancreas is largely atrophic with dilatation of the main duct, image 59/1303. The main duct measures up to 7 mm. Spleen:  Within normal limits in size and appearance. Adrenals/Urinary Tract: The adrenal glands are normal. Unremarkable appearance of the kidneys. Stomach/Bowel: The stomach and the remaining small bowel loops are unremarkable. Dilatation of the hepaticojejunostomy loop is identified. There appears to be marked narrowing of the hepaticojejunostomy loop at the point at which the crosses the midline (from right to left) between the aorta and SMV, image 72/1303. Findings may reflect underlying postsurgical scarring. Recurrent tumor not excluded. Vascular/Lymphatic:  Normal appearance of the abdominal aorta. Other:  No ascites or focal fluid collections identified. Musculoskeletal: No suspicious bone lesions identified. IMPRESSION: 1. Postoperative changes from Whipple procedure again noted. On today's study there is marked  and progressive dilatation of the hepaticojejunostomy loop, bile ducts and main pancreatic duct. Focal area of narrowing of the hepaticojejunostomy loop as it crosses the midline from right to left. Findings may represent underlying postsurgical scarring and stricture formation versus recurrent tumor. 2.  There is a focal area of abnormal signal in the region of the caudate lobe of liver with increased enhancement. It is unclear whether not this is abnormal signal and enhancement within the liver parenchyma or adenopathy. A PET-CT may be helpful to assess for recurrent metabolically active tumor. Electronically Signed   By: Kerby Moors M.D.   On: 12/03/2017 08:37   Mr Abdomen Mrcp Moise Boring Contast  Result Date: 12/03/2017 CLINICAL DATA:  Evaluate for pancreatitis. History of pancreas cancer. Now with ongoing abdominal pain. Progressive biliary dilatation. EXAM: MRI ABDOMEN WITHOUT AND WITH CONTRAST (INCLUDING MRCP) TECHNIQUE: Multiplanar multisequence MR imaging of the abdomen was performed both before and after the administration of intravenous contrast. Heavily T2-weighted images of the biliary and pancreatic ducts were obtained, and three-dimensional MRCP images were rendered by post processing. CONTRAST:  67m MULTIHANCE GADOBENATE DIMEGLUMINE 529 MG/ML IV SOLN COMPARISON:  12/02/2017 FINDINGS: Lower chest: No acute findings. Hepatobiliary: Diffusely heterogeneous enhancement pattern of the liver is identified. There is a focal area of increased T2 signal within the region of the caudate lobe of liver which measures 2.4 by 2.5 cm, image 23/3. This demonstrates relative increased enhancement compared with the remainder of the liver, image 48/1301. Within the remaining portions of the liver there is heterogeneous attenuation and enhancement. There is marked intrahepatic bile duct dilatation. This demonstrates significant progression when compared with 11/18/17. Pancreas: Residual pancreas is largely atrophic with  dilatation of the main duct, image 59/1303. The main duct measures up to 7 mm. Spleen:  Within normal limits in size and appearance. Adrenals/Urinary Tract: The adrenal glands are normal. Unremarkable appearance of the kidneys. Stomach/Bowel: The stomach and the remaining small bowel loops are unremarkable. Dilatation of the hepaticojejunostomy loop is identified. There appears to be marked narrowing of the hepaticojejunostomy loop at the point at which the crosses the midline (from right to left) between the aorta and SMV, image 72/1303. Findings may reflect underlying postsurgical scarring. Recurrent tumor not excluded. Vascular/Lymphatic:  Normal appearance of the abdominal aorta. Other:  No ascites or focal fluid collections identified. Musculoskeletal: No suspicious bone lesions identified. IMPRESSION: 1. Postoperative changes from Whipple procedure again noted. On today's study there is marked and progressive dilatation of the hepaticojejunostomy loop, bile ducts and main pancreatic duct. Focal area of narrowing of the hepaticojejunostomy loop as it crosses the midline from right to left. Findings may represent underlying postsurgical scarring and stricture formation versus recurrent tumor. 2. There is a focal area of abnormal signal in the region of the caudate lobe of liver with increased enhancement. It is unclear whether not this is abnormal signal and enhancement within the liver parenchyma or adenopathy. A PET-CT may be helpful to assess for recurrent metabolically active tumor. Electronically Signed   By: TKerby MoorsM.D.   On: 12/03/2017 08:37   UKoreaAbdomen Limited Ruq  Result Date: 12/02/2017 CLINICAL DATA:  Abdominal pain.  History of pancreatic carcinoma EXAM: ULTRASOUND ABDOMEN LIMITED RIGHT UPPER QUADRANT COMPARISON:  CT abdomen and pelvis November 18, 2017 FINDINGS: Gallbladder: Surgically absent. Common bile duct: Diameter: 11 mm, prominent. No biliary duct mass or calculus is appreciable  by ultrasound. Liver: No focal lesion identified. Liver echogenicity overall is increased. There is intrahepatic biliary duct dilatation. Portal vein is patent on color Doppler imaging with normal direction of blood flow towards the liver. IMPRESSION: 1. Gallbladder absent. There is biliary duct dilatation, similar to recent CT. No biliary duct mass or calculus evident. 2. Diffuse increase in liver  echogenicity, a finding felt to be indicative of hepatic steatosis. While no focal liver lesions are evident, it must be cautioned that the sensitivity of ultrasound for detection of focal liver lesions is diminished significantly in this circumstance. Electronically Signed   By: Lowella Grip III M.D.   On: 12/02/2017 14:19   Scheduled Meds: . amitriptyline  50 mg Oral QHS  . flecainide  100 mg Oral BID  . fluticasone  2 spray Each Nare Daily   Continuous Infusions: . piperacillin-tazobactam (ZOSYN)  IV Stopped (12/04/17 0800)    LOS: 2 days   Kerney Elbe, DO [facility] Pager [phone]  If 7PM-7AM, please contact night-coverage [email or web address] Password [facility] 12/04/2017, 12:32 PM

## 2017-12-04 NOTE — Progress Notes (Signed)
Pt doing well today;still has some LUQ discomfort but better VSS;AF WBC nl;hgb 8.4, creat nl; t bili 2.7(4.6) Case d/w Drs. Watts/Byerly; with current stability and lowering of LFT's will hold on PTC for now and cont to monitor LFT'S/CBC/vitals; if any acute worsening occurs can proceed with PTC/drain; above d/w pt/family

## 2017-12-04 NOTE — Progress Notes (Signed)
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 [name]. History of endometrial cancer History of GERD/gastritis. History of hypertension History of PAF (on Xarelto)  - pt's pain continues to improve and fevers have resolved, WBC WNL - labs are improving and Tbili is down to 2.7.  - IR to place drain and GI to potentially do EGD  Dispo: At this point there is no indicated surgical intervention. We will follow with you.     LOS: 2 days    Subjective: CC: LUQ abdominal pain improving  Pt is feeling better. Husband at bedside. No fever, chills, nausea or vomiting.   Objective: Vital signs in last 24 hours: Temp:  [97.7 F (36.5 C)-97.9 F (36.6 C)] 97.7 F (36.5 C) (03/14 0356) Pulse Rate:  [70-71] 70 (03/14 0356) Resp:  [18-20] 18 (03/14 0356) BP: (118-124)/(62-70) 124/70 (03/14 0356) SpO2:  [99 %-100 %] 100 % (03/14 0356) Last BM Date: 12/03/17  Intake/Output from previous day: 03/13 0701 - 03/14 0700 In: 1639.6 [I.V.:1139.6; IV Piggyback:500] Out: 3 [Stool:3] Intake/Output this shift: No intake/output data recorded.  PE: Gen:  Alert, NAD, pleasant, cooperative Pulm:   Rate and effort normal Abd: Soft, not distended, +BS, well-healed midline incision. Good bowel sounds. She is mildly tender in the left upper quadrant. She does not have peritonitis. Skin: no rashes noted, warm and dry  Anti-infectives: Anti-infectives (From admission, onward)   Start     Dose/Rate Route Frequency  Ordered Stop   12/02/17 2200  vancomycin (VANCOCIN) 500 mg in sodium chloride 0.9 % 100 mL IVPB  Status:  Discontinued     500 mg 100 mL/hr over 60 Minutes Intravenous Every 12 hours 12/02/17 1726 12/03/17 1857   12/02/17 2100  piperacillin-tazobactam (ZOSYN) IVPB 3.375 g     3.375 g 12.5 mL/hr over 240 Minutes Intravenous Every 8 hours 12/02/17 1724     12/02/17 1330  piperacillin-tazobactam (ZOSYN) IVPB 3.375 g  Status:  Discontinued     3.375 g 100 mL/hr over 30 Minutes Intravenous  Once 12/02/17 1323 12/02/17 1326   12/02/17 1330  vancomycin (VANCOCIN) IVPB 1000 mg/200 mL premix     1,000 mg 200 mL/hr over 60 Minutes Intravenous  Once 12/02/17 1323 12/02/17 1445      Lab Results:  Recent Labs    12/03/17 0515 12/04/17 0353  WBC 7.8 5.7  HGB 8.6* 8.4*  HCT 25.3* 24.5*  PLT 235 282   BMET Recent Labs    12/03/17 0515 12/04/17 0353  NA 139 139  K 3.4* 3.5  CL 108 107  CO2 24 24  GLUCOSE 104* 69  BUN 6 6  CREATININE 0.42* 0.52  CALCIUM 8.1* 8.3*   PT/INR Recent Labs    12/02/17 1928 12/04/17 0353  LABPROT 19.8* 17.7*  INR 1.70 1.47   CMP     Component Value Date/Time   NA 139 12/04/2017 0353   NA 138 06/03/2017 0937   K 3.5 12/04/2017 0353   K 4.0 06/03/2017 0937   CL 107 12/04/2017 0353   CO2 24 12/04/2017 0353  CO2 24 06/03/2017 0937   GLUCOSE 69 12/04/2017 0353   GLUCOSE 111 06/03/2017 0937   BUN 6 12/04/2017 0353   BUN 13.0 06/03/2017 0937   CREATININE 0.52 12/04/2017 0353   CREATININE 0.76 12/02/2017 1006   CREATININE 0.8 06/03/2017 0937   CALCIUM 8.3 (L) 12/04/2017 0353   CALCIUM 9.2 06/03/2017 0937   PROT 5.7 (L) 12/04/2017 0353   PROT 6.9 06/03/2017 0937   ALBUMIN 2.5 (L) 12/04/2017 0353   ALBUMIN 3.6 06/03/2017 0937   AST 102 (H) 12/04/2017 0353   AST 114 (H) 12/02/2017 1006   AST 55 (H) 06/03/2017 0937   ALT 50 12/04/2017 0353   ALT 60 (H) 12/02/2017 1006   ALT 38 06/03/2017 0937   ALKPHOS 564 (H) 12/04/2017 0353   ALKPHOS 142  06/03/2017 0937   BILITOT 2.7 (H) 12/04/2017 0353   BILITOT 3.4 (H) 12/02/2017 1006   BILITOT 0.56 06/03/2017 0937   GFRNONAA >60 12/04/2017 0353   GFRNONAA >60 12/02/2017 1006   GFRAA >60 12/04/2017 0353   GFRAA >60 12/02/2017 1006   Lipase     Component Value Date/Time   LIPASE 19 12/02/2017 1325   LIPASE 5 (L) 06/26/2017 1446    Studies/Results: Dg Chest 2 View  Result Date: 12/02/2017 CLINICAL DATA:  Flu like symptoms EXAM: CHEST - 2 VIEW COMPARISON:  Chest CT 01/25/2017 and CXR 01/25/2017 FINDINGS: The heart size and mediastinal contours are within normal limits. Chronic mild interstitial prominence without alveolar consolidation, dominant mass, effusion or pneumothorax. Left-sided port catheter is noted with tip in the mid SVC. No acute osseous abnormality. Partially visualized left shoulder arthroplasty. IMPRESSION: No active cardiopulmonary disease. Electronically Signed   By: Ashley Royalty M.D.   On: 12/02/2017 17:16   Ct Abdomen Pelvis W Contrast  Result Date: 12/02/2017 CLINICAL DATA:  Acute abdominal pain. Hypotension. Fever/chills. Pancreatic cancer diagnosed 2015. Endometrial cancer diagnosed 2012. EXAM: CT ABDOMEN AND PELVIS WITH CONTRAST TECHNIQUE: Multidetector CT imaging of the abdomen and pelvis was performed using the standard protocol following bolus administration of intravenous contrast. CONTRAST:  121mL ISOVUE-300 IOPAMIDOL (ISOVUE-300) INJECTION 61% COMPARISON:  11/18/2017. FINDINGS: Lower chest: Lung bases are clear. Hepatobiliary: No suspicious/enhancing hepatic lesions. Status post Whipple procedure with choledochojejunostomy. Mild intrahepatic and extrahepatic ductal dilatation. Dilated common duct, measuring 14 mm centrally (coronal image 44, previously 11 mm. Pancreas: Status post Whipple procedure with pancreaticojejunostomy. Atrophy of the residual pancreatic body/tail with mild pancreatic duct dilatation (series 2/image 24). Spleen: Within normal limits.  Adrenals/Urinary Tract: Adrenal glands within normal limits. 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 Left kidney is within normal limits. No hydronephrosis. Bladder is within normal limits. Stomach/Bowel: Status post partial gastrectomy with gastrojejunostomy. Mildly dilated afferent limb in the right upper quadrant (series 2/image 29), although with improved wall thickening when compared to the prior CT. No evidence of bowel obstruction. Appendix is not discretely visualized. Vascular/Lymphatic: No evidence of abdominal aortic aneurysm. Atherosclerotic calcifications of the abdominal aorta and branch vessels. No suspicious abdominopelvic lymphadenopathy. Reproductive: Status post hysterectomy. No adnexal masses. Other: No abdominopelvic ascites. Musculoskeletal: Mild superior endplate Schmorl's node deformities at T10, T11, and L5. IMPRESSION: Mildly progressive intrahepatic and extrahepatic ductal dilatation, as described above. Associated dilatation of the afferent limb. 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 GI/surgical consultation is suggested. No findings specific for recurrent or metastatic disease in this patient with history of pancreatic cancer and endometrial cancer. Electronically Signed   By: Henderson Newcomer.D.  On: 12/02/2017 15:13   Mr 3d Recon At Scanner  Result Date: 12/03/2017 CLINICAL DATA:  Evaluate for pancreatitis. History of pancreas cancer. Now with ongoing abdominal pain. Progressive biliary dilatation. EXAM: MRI ABDOMEN WITHOUT AND WITH CONTRAST (INCLUDING MRCP) TECHNIQUE: Multiplanar multisequence MR imaging of the abdomen was performed both before and after the administration of intravenous contrast. Heavily T2-weighted images of the biliary and pancreatic ducts were obtained, and three-dimensional MRCP images were rendered by post processing. CONTRAST:  72mL MULTIHANCE GADOBENATE DIMEGLUMINE 529 MG/ML IV SOLN COMPARISON:   12/02/2017 FINDINGS: Lower chest: No acute findings. Hepatobiliary: Diffusely heterogeneous enhancement pattern of the liver is identified. There is a focal area of increased T2 signal within the region of the caudate lobe of liver which measures 2.4 by 2.5 cm, image 23/3. This demonstrates relative increased enhancement compared with the remainder of the liver, image 48/1301. Within the remaining portions of the liver there is heterogeneous attenuation and enhancement. There is marked intrahepatic bile duct dilatation. This demonstrates significant progression when compared with 11/18/17. Pancreas: Residual pancreas is largely atrophic with dilatation of the main duct, image 59/1303. The main duct measures up to 7 mm. Spleen:  Within normal limits in size and appearance. Adrenals/Urinary Tract: The adrenal glands are normal. Unremarkable appearance of the kidneys. Stomach/Bowel: The stomach and the remaining small bowel loops are unremarkable. Dilatation of the hepaticojejunostomy loop is identified. There appears to be marked narrowing of the hepaticojejunostomy loop at the point at which the crosses the midline (from right to left) between the aorta and SMV, image 72/1303. Findings may reflect underlying postsurgical scarring. Recurrent tumor not excluded. Vascular/Lymphatic:  Normal appearance of the abdominal aorta. Other:  No ascites or focal fluid collections identified. Musculoskeletal: No suspicious bone lesions identified. IMPRESSION: 1. Postoperative changes from Whipple procedure again noted. On today's study there is marked and progressive dilatation of the hepaticojejunostomy loop, bile ducts and main pancreatic duct. Focal area of narrowing of the hepaticojejunostomy loop as it crosses the midline from right to left. Findings may represent underlying postsurgical scarring and stricture formation versus recurrent tumor. 2. There is a focal area of abnormal signal in the region of the caudate lobe of  liver with increased enhancement. It is unclear whether not this is abnormal signal and enhancement within the liver parenchyma or adenopathy. A PET-CT may be helpful to assess for recurrent metabolically active tumor. Electronically Signed   By: Kerby Moors M.D.   On: 12/03/2017 08:37   Mr Abdomen Mrcp Moise Boring Contast  Result Date: 12/03/2017 CLINICAL DATA:  Evaluate for pancreatitis. History of pancreas cancer. Now with ongoing abdominal pain. Progressive biliary dilatation. EXAM: MRI ABDOMEN WITHOUT AND WITH CONTRAST (INCLUDING MRCP) TECHNIQUE: Multiplanar multisequence MR imaging of the abdomen was performed both before and after the administration of intravenous contrast. Heavily T2-weighted images of the biliary and pancreatic ducts were obtained, and three-dimensional MRCP images were rendered by post processing. CONTRAST:  25mL MULTIHANCE GADOBENATE DIMEGLUMINE 529 MG/ML IV SOLN COMPARISON:  12/02/2017 FINDINGS: Lower chest: No acute findings. Hepatobiliary: Diffusely heterogeneous enhancement pattern of the liver is identified. There is a focal area of increased T2 signal within the region of the caudate lobe of liver which measures 2.4 by 2.5 cm, image 23/3. This demonstrates relative increased enhancement compared with the remainder of the liver, image 48/1301. Within the remaining portions of the liver there is heterogeneous attenuation and enhancement. There is marked intrahepatic bile duct dilatation. This demonstrates significant progression when compared with 11/18/17. Pancreas:  Residual pancreas is largely atrophic with dilatation of the main duct, image 59/1303. The main duct measures up to 7 mm. Spleen:  Within normal limits in size and appearance. Adrenals/Urinary Tract: The adrenal glands are normal. Unremarkable appearance of the kidneys. Stomach/Bowel: The stomach and the remaining small bowel loops are unremarkable. Dilatation of the hepaticojejunostomy loop is identified. There appears to  be marked narrowing of the hepaticojejunostomy loop at the point at which the crosses the midline (from right to left) between the aorta and SMV, image 72/1303. Findings may reflect underlying postsurgical scarring. Recurrent tumor not excluded. Vascular/Lymphatic:  Normal appearance of the abdominal aorta. Other:  No ascites or focal fluid collections identified. Musculoskeletal: No suspicious bone lesions identified. IMPRESSION: 1. Postoperative changes from Whipple procedure again noted. On today's study there is marked and progressive dilatation of the hepaticojejunostomy loop, bile ducts and main pancreatic duct. Focal area of narrowing of the hepaticojejunostomy loop as it crosses the midline from right to left. Findings may represent underlying postsurgical scarring and stricture formation versus recurrent tumor. 2. There is a focal area of abnormal signal in the region of the caudate lobe of liver with increased enhancement. It is unclear whether not this is abnormal signal and enhancement within the liver parenchyma or adenopathy. A PET-CT may be helpful to assess for recurrent metabolically active tumor. Electronically Signed   By: Kerby Moors M.D.   On: 12/03/2017 08:37   US Abdomen Limited Ruq  Result Date: 12/02/2017 CLINICAL DATA:  Abdominal pain.  History of pancreatic carcinoma EXAM: ULTRASOUND ABDOMEN LIMITED RIGHT UPPER QUADRANT COMPARISON:  CT abdomen and pelvis November 18, 2017 FINDINGS: Gallbladder: Surgically absent. Common bile duct: Diameter: 11 mm, prominent. No biliary duct mass or calculus is appreciable by ultrasound. Liver: No focal lesion identified. Liver echogenicity overall is increased. There is intrahepatic biliary duct dilatation. Portal vein is patent on color Doppler imaging with normal direction of blood flow towards the liver. IMPRESSION: 1. Gallbladder absent. There is biliary duct dilatation, similar to recent CT. No biliary duct mass or calculus evident. 2. Diffuse  increase in liver echogenicity, a finding felt to be indicative of hepatic steatosis. While no focal liver lesions are evident, it must be cautioned that the sensitivity of ultrasound for detection of focal liver lesions is diminished significantly in this circumstance. Electronically Signed   By: Lowella Grip III M.D.   On: 12/02/2017 14:19      Kalman Drape , [facility] 12/04/2017, 9:54 AM  Pager: [phone] Mon-Wed, Friday 7:00am-4:30pm Thurs 7am-11:30am  Consults: [phone]

## 2017-12-05 ENCOUNTER — Inpatient Hospital Stay (HOSPITAL_COMMUNITY): Payer: Medicare Other | Admitting: Certified Registered Nurse Anesthetist

## 2017-12-05 ENCOUNTER — Encounter (HOSPITAL_COMMUNITY)
Admission: EM | Disposition: A | Discharge status: Home / Self Care | Payer: Self-pay | Source: Home / Self Care | Attending: Internal Medicine

## 2017-12-05 ENCOUNTER — Inpatient Hospital Stay (HOSPITAL_COMMUNITY): Payer: Medicare Other

## 2017-12-05 ENCOUNTER — Encounter (HOSPITAL_COMMUNITY): Payer: Self-pay

## 2017-12-05 ENCOUNTER — Ambulatory Visit: Payer: Medicare Other | Admitting: Gastroenterology

## 2017-12-05 DIAGNOSIS — K9189 Other postprocedural complications and disorders of digestive system: Secondary | ICD-10-CM

## 2017-12-05 DIAGNOSIS — R935 Abnormal findings on diagnostic imaging of other abdominal regions, including retroperitoneum: Secondary | ICD-10-CM

## 2017-12-05 HISTORY — PX: IR INT EXT BILIARY DRAIN WITH CHOLANGIOGRAM: IMG6044

## 2017-12-05 HISTORY — PX: ESOPHAGOGASTRODUODENOSCOPY (EGD) WITH PROPOFOL: SHX5813

## 2017-12-05 LAB — COMPREHENSIVE METABOLIC PANEL
ALT: 51 U/L (ref 14–54)
AST: 104 U/L — ABNORMAL HIGH (ref 15–41)
Albumin: 2.6 g/dL — ABNORMAL LOW (ref 3.5–5.0)
Alkaline Phosphatase: 585 U/L — ABNORMAL HIGH (ref 38–126)
Anion gap: 12 (ref 5–15)
BILIRUBIN TOTAL: 3.6 mg/dL — AB (ref 0.3–1.2)
BUN: 7 mg/dL (ref 6–20)
CHLORIDE: 102 mmol/L (ref 101–111)
CO2: 22 mmol/L (ref 22–32)
CREATININE: 0.54 mg/dL (ref 0.44–1.00)
Calcium: 8.4 mg/dL — ABNORMAL LOW (ref 8.9–10.3)
Glucose, Bld: 58 mg/dL — ABNORMAL LOW (ref 65–99)
POTASSIUM: 3.8 mmol/L (ref 3.5–5.1)
Sodium: 136 mmol/L (ref 135–145)
TOTAL PROTEIN: 6.1 g/dL — AB (ref 6.5–8.1)

## 2017-12-05 LAB — CBC WITH DIFFERENTIAL/PLATELET
Basophils Absolute: 0 10*3/uL (ref 0.0–0.1)
Basophils Relative: 0 %
EOS PCT: 4 %
Eosinophils Absolute: 0.2 10*3/uL (ref 0.0–0.7)
HCT: 26.2 % — ABNORMAL LOW (ref 36.0–46.0)
Hemoglobin: 8.8 g/dL — ABNORMAL LOW (ref 12.0–15.0)
LYMPHS ABS: 1.2 10*3/uL (ref 0.7–4.0)
LYMPHS PCT: 21 %
MCH: 29.5 pg (ref 26.0–34.0)
MCHC: 33.6 g/dL (ref 30.0–36.0)
MCV: 87.9 fL (ref 78.0–100.0)
MONO ABS: 0.4 10*3/uL (ref 0.1–1.0)
MONOS PCT: 8 %
Neutro Abs: 4 10*3/uL (ref 1.7–7.7)
Neutrophils Relative %: 67 %
Platelets: 272 10*3/uL (ref 150–400)
RBC: 2.98 MIL/uL — ABNORMAL LOW (ref 3.87–5.11)
RDW: 14.6 % (ref 11.5–15.5)
WBC: 5.9 10*3/uL (ref 4.0–10.5)

## 2017-12-05 LAB — PHOSPHORUS: Phosphorus: 3.4 mg/dL (ref 2.5–4.6)

## 2017-12-05 LAB — MAGNESIUM: Magnesium: 2.1 mg/dL (ref 1.7–2.4)

## 2017-12-05 SURGERY — ESOPHAGOGASTRODUODENOSCOPY (EGD) WITH PROPOFOL
Anesthesia: Monitor Anesthesia Care

## 2017-12-05 MED ORDER — PROPOFOL 500 MG/50ML IV EMUL
INTRAVENOUS | Status: DC | PRN
  Administered 2017-12-05: 150 ug/kg/min via INTRAVENOUS

## 2017-12-05 MED ORDER — IOPAMIDOL (ISOVUE-300) INJECTION 61%
INTRAVENOUS | Status: AC
  Filled 2017-12-05: qty 50

## 2017-12-05 MED ORDER — SODIUM CHLORIDE 0.9% FLUSH
5.0000 mL | Freq: Three times a day (TID) | INTRAVENOUS | Status: DC
  Administered 2017-12-05 – 2017-12-09 (×9): 5 mL

## 2017-12-05 MED ORDER — LIDOCAINE HCL (PF) 1 % IJ SOLN
INTRAMUSCULAR | Status: AC | PRN
  Administered 2017-12-05: 10 mL

## 2017-12-05 MED ORDER — LACTATED RINGERS IV SOLN
INTRAVENOUS | Status: DC | PRN
  Administered 2017-12-05: 14:00:00 via INTRAVENOUS

## 2017-12-05 MED ORDER — FENTANYL CITRATE (PF) 100 MCG/2ML IJ SOLN
INTRAMUSCULAR | Status: AC | PRN
  Administered 2017-12-05: 50 ug via INTRAVENOUS
  Administered 2017-12-05 (×2): 25 ug via INTRAVENOUS
  Administered 2017-12-05: 50 ug via INTRAVENOUS

## 2017-12-05 MED ORDER — MIDAZOLAM HCL 2 MG/2ML IJ SOLN
INTRAMUSCULAR | Status: AC
  Filled 2017-12-05: qty 4

## 2017-12-05 MED ORDER — PROPOFOL 10 MG/ML IV BOLUS
INTRAVENOUS | Status: AC
  Filled 2017-12-05: qty 60

## 2017-12-05 MED ORDER — LIDOCAINE 2% (20 MG/ML) 5 ML SYRINGE
INTRAMUSCULAR | Status: DC | PRN
  Administered 2017-12-05: 70 mg via INTRAVENOUS

## 2017-12-05 MED ORDER — PROPOFOL 10 MG/ML IV BOLUS
INTRAVENOUS | Status: AC
  Filled 2017-12-05: qty 20

## 2017-12-05 MED ORDER — MIDAZOLAM HCL 2 MG/2ML IJ SOLN
INTRAMUSCULAR | Status: AC | PRN
  Administered 2017-12-05 (×2): 0.5 mg via INTRAVENOUS
  Administered 2017-12-05 (×2): 1 mg via INTRAVENOUS

## 2017-12-05 MED ORDER — IOPAMIDOL (ISOVUE-300) INJECTION 61%
20.0000 mL | Freq: Once | INTRAVENOUS | Status: AC | PRN
  Administered 2017-12-05: 20 mL

## 2017-12-05 MED ORDER — PROPOFOL 10 MG/ML IV BOLUS
INTRAVENOUS | Status: DC | PRN
  Administered 2017-12-05 (×5): 20 mg via INTRAVENOUS

## 2017-12-05 MED ORDER — FENTANYL CITRATE (PF) 100 MCG/2ML IJ SOLN
INTRAMUSCULAR | Status: AC
  Filled 2017-12-05: qty 4

## 2017-12-05 SURGICAL SUPPLY — 15 items

## 2017-12-05 NOTE — Progress Notes (Signed)
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 [name]. History of endometrial cancer History of GERD/gastritis. History of hypertension History of PAF (on Xarelto)  - pt's pain continues to improve and fevers have resolved, WBC WNL - labs are improving and Tbili is down to 2.7.  - IR not going to place drain due to improvement in labs - GI to potentially do EGD  Dispo: At this point there is no indicated surgical intervention. We will follow with you.    LOS: 3 days    Subjective: CC: lower abdominal cramping with diarrhea overnight  Continued LUQ abdominal pain although improved. Episode of diarrhea last night and lower abdominal cramping pain. No nausea or vomiting, fever or chills. Pt is feeling frustrated. Husband at bedside.   Objective: Vital signs in last 24 hours: Temp:  [98 F (36.7 C)-98.5 F (36.9 C)] 98.5 F (36.9 C) (03/15 0547) Pulse Rate:  [55-65] 60 (03/15 0547) Resp:  [18-20] 20 (03/15 0547) BP: (114-128)/(67-87) 124/67 (03/15 0547) SpO2:  [96 %-97 %] 96 % (03/15 0547) Last BM Date: 12/03/17  Intake/Output from previous day: 03/14 0701 - 03/15 0700 In: 10 [I.V.:10] Out: -  Intake/Output this shift: No intake/output data recorded.  PE: Gen: Alert, NAD, pleasant, cooperative Pulm:Rate andeffort normal Abd: Soft,not distended,+BS, well-healed midline incision.Good bowel sounds. She ismildlytender in the left upper quadrant and more tender in the periumbilical  region and lower abdomen.She does not have peritonitis. No guarding Skin: no rashes noted, warm and dry   Anti-infectives: Anti-infectives (From admission, onward)   Start     Dose/Rate Route Frequency Ordered Stop   12/02/17 2200  vancomycin (VANCOCIN) 500 mg in sodium chloride 0.9 % 100 mL IVPB  Status:  Discontinued     500 mg 100 mL/hr over 60 Minutes Intravenous Every 12 hours 12/02/17 1726 12/03/17 1857   12/02/17 2100  piperacillin-tazobactam (ZOSYN) IVPB 3.375 g     3.375 g 12.5 mL/hr over 240 Minutes Intravenous Every 8 hours 12/02/17 1724     12/02/17 1330  piperacillin-tazobactam (ZOSYN) IVPB 3.375 g  Status:  Discontinued     3.375 g 100 mL/hr over 30 Minutes Intravenous  Once 12/02/17 1323 12/02/17 1326   12/02/17 1330  vancomycin (VANCOCIN) IVPB 1000 mg/200 mL premix     1,000 mg 200 mL/hr over 60 Minutes Intravenous  Once 12/02/17 1323 12/02/17 1445      Lab Results:  Recent Labs    12/04/17 0353 12/05/17 0536  WBC 5.7 5.9  HGB 8.4* 8.8*  HCT 24.5* 26.2*  PLT 282 272   BMET Recent Labs    12/04/17 0353 12/05/17 0536  NA 139 136  K 3.5 3.8  CL 107 102  CO2 24 22  GLUCOSE 69 58*  BUN 6 7  CREATININE 0.52 0.54  CALCIUM 8.3* 8.4*   PT/INR Recent Labs    12/02/17 1928 12/04/17 0353  LABPROT 19.8* 17.7*  INR 1.70 1.47   CMP     Component Value Date/Time   NA 136 12/05/2017 0536   NA 138 06/03/2017 0937   K 3.8 12/05/2017 0536   K 4.0 06/03/2017 0937   CL 102 12/05/2017 0536  CO2 22 12/05/2017 0536   CO2 24 06/03/2017 0937   GLUCOSE 58 (L) 12/05/2017 0536   GLUCOSE 111 06/03/2017 0937   BUN 7 12/05/2017 0536   BUN 13.0 06/03/2017 0937   CREATININE 0.54 12/05/2017 0536   CREATININE 0.76 12/02/2017 1006   CREATININE 0.8 06/03/2017 0937   CALCIUM 8.4 (L) 12/05/2017 0536   CALCIUM 9.2 06/03/2017 0937   PROT 6.1 (L) 12/05/2017 0536   PROT 6.9 06/03/2017 0937   ALBUMIN 2.6 (L) 12/05/2017 0536   ALBUMIN 3.6 06/03/2017 0937   AST 104 (H)  12/05/2017 0536   AST 114 (H) 12/02/2017 1006   AST 55 (H) 06/03/2017 0937   ALT 51 12/05/2017 0536   ALT 60 (H) 12/02/2017 1006   ALT 38 06/03/2017 0937   ALKPHOS 585 (H) 12/05/2017 0536   ALKPHOS 142 06/03/2017 0937   BILITOT 3.6 (H) 12/05/2017 0536   BILITOT 3.4 (H) 12/02/2017 1006   BILITOT 0.56 06/03/2017 0937   GFRNONAA >60 12/05/2017 0536   GFRNONAA >60 12/02/2017 1006   GFRAA >60 12/05/2017 0536   GFRAA >60 12/02/2017 1006   Lipase     Component Value Date/Time   LIPASE 19 12/02/2017 1325   LIPASE 5 (L) 06/26/2017 1446    Studies/Results: No results found.    Kalman Drape , [facility] 12/05/2017, 8:27 AM  Pager: [phone] Mon-Wed, Friday 7:00am-4:30pm Thurs 7am-11:30am  Consults: [phone]

## 2017-12-05 NOTE — Progress Notes (Signed)
Referring Physician(s): Perry,J  Supervising Physician: Arne Cleveland  Patient Status:  [facility] - In-pt  Chief Complaint: Pancreatic cancer, elevated liver function tests, abdominal pain   Subjective: Patient continues to have some epigastric /left upper quadrant discomfort with some radiation to the lower abdomen; plan noted for EGD today   Allergies: Ace inhibitors; Scopolamine; and Sulfa antibiotics  Medications: Prior to Admission medications   Medication Sig Start Date End Date Taking? Authorizing Provider  acetaminophen (TYLENOL) 500 MG tablet Take 1,000 mg by mouth every 6 (six) hours as needed for pain.    Yes [provider]  albuterol (PROVENTIL HFA;VENTOLIN HFA) 108 (90 Base) MCG/ACT inhaler Inhale 2 puffs into the lungs every 6 (six) hours as needed for wheezing or shortness of breath. 01/27/17  Yes Mikhail, Velta Addison, DO  amitriptyline (ELAVIL) 25 MG tablet Take 2 tablets (50 mg total) by mouth at bedtime. 10/23/17  Yes Ladell Pier, MD  diphenhydrAMINE (BENADRYL) 25 MG tablet Take 50 mg by mouth every 6 (six) hours as needed for itching or allergies.    Yes [provider]  diphenoxylate-atropine (LOMOTIL) 2.5-0.025 MG tablet Take 1-2 tablets by mouth 4 (four) times daily as needed for diarrhea or loose stools. 01/21/17  Yes Ladell Pier, MD  flecainide (TAMBOCOR) 100 MG tablet TAKE 1 TABLET BY MOUTH TWICE A DAY 02/24/17  Yes Eileen Stanford, PA-C  fluticasone (FLONASE) 50 MCG/ACT nasal spray Place 1 spray into both nostrils 2 (two) times daily as needed for allergies.    Yes [provider]  guaiFENesin (MUCINEX) 600 MG 12 hr tablet Take 600 mg by mouth every morning.    Yes [provider]  lidocaine-prilocaine (EMLA) cream Apply small amount over port area 1-2 hours prior to treatment and cover with plastic wrap.  DO NOT RUB IN. 07/09/16  Yes Ladell Pier, MD  LORazepam (ATIVAN) 1 MG tablet Take 0.5 tablets (0.5 mg total)  by mouth every 8 (eight) hours as needed for anxiety. 05/10/15  Yes Owens Shark, NP  metoprolol succinate (TOPROL-XL) 25 MG 24 hr tablet Take 12.5 mg by mouth daily.   Yes [provider]  morphine (MSIR) 15 MG tablet Take 1 tablet (15 mg total) by mouth every 4 (four) hours as needed for severe pain. 11/19/17  Yes Ladell Pier, MD  promethazine (PHENERGAN) 12.5 MG tablet Take 1 tablet (12.5 mg total) by mouth every 6 (six) hours as needed for nausea or vomiting. 06/03/17  Yes Ladell Pier, MD  XARELTO 20 MG TABS tablet TAKE 1 TABLET BY MOUTH EVERY DAY 07/14/17  Yes Jerline Pain, MD  zolpidem (AMBIEN CR) 6.25 MG CR tablet Take 1 tablet (6.25 mg total) by mouth at bedtime. 02/26/17  Yes Ladell Pier, MD  HYDROcodone-acetaminophen (NORCO/VICODIN) 5-325 MG tablet Take 1-2 tablets by mouth every 4 (four) hours as needed for moderate pain. Patient not taking: Reported on 12/02/2017 11/19/17   Ladell Pier, MD  lipase/protease/amylase (CREON) 12000 UNITS CPEP capsule Take 2 capsules (24,000 Units total) by mouth 3 (three) times daily before meals. Patient not taking: Reported on 12/02/2017 01/05/15   Owens Shark, NP     Vital Signs: BP 124/67 (BP Location: Left Arm)   Pulse 60   Temp 98.5 F (36.9 C) (Oral)   Resp 20   LMP  (LMP Unknown)   SpO2 96%   Physical Exam awake, alert.  Chest clear to auscultation bilaterally.,  Intact left chest  wall Port-A-Cath.  Heart with regular rate and rhythm.  Soft, positive bowel sounds, tender epigastric/ left upper quadrant and some minimal lower abdominal tenderness  Imaging: Dg Chest 2 View  Result Date: 12/02/2017 CLINICAL DATA:  Flu like symptoms EXAM: CHEST - 2 VIEW COMPARISON:  Chest CT 01/25/2017 and CXR 01/25/2017 FINDINGS: The heart size and mediastinal contours are within normal limits. Chronic mild interstitial prominence without alveolar consolidation, dominant mass, effusion or pneumothorax. Left-sided port catheter is  noted with tip in the mid SVC. No acute osseous abnormality. Partially visualized left shoulder arthroplasty. IMPRESSION: No active cardiopulmonary disease. Electronically Signed   By: Ashley Royalty M.D.   On: 12/02/2017 17:16   Ct Abdomen Pelvis W Contrast  Result Date: 12/02/2017 CLINICAL DATA:  Acute abdominal pain. Hypotension. Fever/chills. Pancreatic cancer diagnosed 2015. Endometrial cancer diagnosed 2012. EXAM: CT ABDOMEN AND PELVIS WITH CONTRAST TECHNIQUE: Multidetector CT imaging of the abdomen and pelvis was performed using the standard protocol following bolus administration of intravenous contrast. CONTRAST:  142m ISOVUE-300 IOPAMIDOL (ISOVUE-300) INJECTION 61% COMPARISON:  11/18/2017. FINDINGS: Lower chest: Lung bases are clear. Hepatobiliary: No suspicious/enhancing hepatic lesions. Status post Whipple procedure with choledochojejunostomy. Mild intrahepatic and extrahepatic ductal dilatation. Dilated common duct, measuring 14 mm centrally (coronal image 44, previously 11 mm. Pancreas: Status post Whipple procedure with pancreaticojejunostomy. Atrophy of the residual pancreatic body/tail with mild pancreatic duct dilatation (series 2/image 24). Spleen: Within normal limits. Adrenals/Urinary Tract: Adrenal glands within normal limits. 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 Left kidney is within normal limits. No hydronephrosis. Bladder is within normal limits. Stomach/Bowel: Status post partial gastrectomy with gastrojejunostomy. Mildly dilated afferent limb in the right upper quadrant (series 2/image 29), although with improved wall thickening when compared to the prior CT. No evidence of bowel obstruction. Appendix is not discretely visualized. Vascular/Lymphatic: No evidence of abdominal aortic aneurysm. Atherosclerotic calcifications of the abdominal aorta and branch vessels. No suspicious abdominopelvic lymphadenopathy. Reproductive: Status post hysterectomy. No adnexal masses. Other: No  abdominopelvic ascites. Musculoskeletal: Mild superior endplate Schmorl's node deformities at T10, T11, and L5. IMPRESSION: Mildly progressive intrahepatic and extrahepatic ductal dilatation, as described above. Associated dilatation of the afferent limb. 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 GI/surgical consultation is suggested. No findings specific for recurrent or metastatic disease in this patient with history of pancreatic cancer and endometrial cancer. Electronically Signed   By: SJulian HyM.D.   On: 12/02/2017 15:13   Mr 3d Recon At Scanner  Result Date: 12/03/2017 CLINICAL DATA:  Evaluate for pancreatitis. History of pancreas cancer. Now with ongoing abdominal pain. Progressive biliary dilatation. EXAM: MRI ABDOMEN WITHOUT AND WITH CONTRAST (INCLUDING MRCP) TECHNIQUE: Multiplanar multisequence MR imaging of the abdomen was performed both before and after the administration of intravenous contrast. Heavily T2-weighted images of the biliary and pancreatic ducts were obtained, and three-dimensional MRCP images were rendered by post processing. CONTRAST:  224mMULTIHANCE GADOBENATE DIMEGLUMINE 529 MG/ML IV SOLN COMPARISON:  12/02/2017 FINDINGS: Lower chest: No acute findings. Hepatobiliary: Diffusely heterogeneous enhancement pattern of the liver is identified. There is a focal area of increased T2 signal within the region of the caudate lobe of liver which measures 2.4 by 2.5 cm, image 23/3. This demonstrates relative increased enhancement compared with the remainder of the liver, image 48/1301. Within the remaining portions of the liver there is heterogeneous attenuation and enhancement. There is marked intrahepatic bile duct dilatation. This demonstrates significant progression when compared with 11/18/17. Pancreas: Residual pancreas is largely  atrophic with dilatation of the main duct, image 59/1303. The main duct measures up to 7 mm.  Spleen:  Within normal limits in size and appearance. Adrenals/Urinary Tract: The adrenal glands are normal. Unremarkable appearance of the kidneys. Stomach/Bowel: The stomach and the remaining small bowel loops are unremarkable. Dilatation of the hepaticojejunostomy loop is identified. There appears to be marked narrowing of the hepaticojejunostomy loop at the point at which the crosses the midline (from right to left) between the aorta and SMV, image 72/1303. Findings may reflect underlying postsurgical scarring. Recurrent tumor not excluded. Vascular/Lymphatic:  Normal appearance of the abdominal aorta. Other:  No ascites or focal fluid collections identified. Musculoskeletal: No suspicious bone lesions identified. IMPRESSION: 1. Postoperative changes from Whipple procedure again noted. On today's study there is marked and progressive dilatation of the hepaticojejunostomy loop, bile ducts and main pancreatic duct. Focal area of narrowing of the hepaticojejunostomy loop as it crosses the midline from right to left. Findings may represent underlying postsurgical scarring and stricture formation versus recurrent tumor. 2. There is a focal area of abnormal signal in the region of the caudate lobe of liver with increased enhancement. It is unclear whether not this is abnormal signal and enhancement within the liver parenchyma or adenopathy. A PET-CT may be helpful to assess for recurrent metabolically active tumor. Electronically Signed   By: Kerby Moors M.D.   On: 12/03/2017 08:37   Mr Abdomen Mrcp Moise Boring Contast  Result Date: 12/03/2017 CLINICAL DATA:  Evaluate for pancreatitis. History of pancreas cancer. Now with ongoing abdominal pain. Progressive biliary dilatation. EXAM: MRI ABDOMEN WITHOUT AND WITH CONTRAST (INCLUDING MRCP) TECHNIQUE: Multiplanar multisequence MR imaging of the abdomen was performed both before and after the administration of intravenous contrast. Heavily T2-weighted images of the biliary  and pancreatic ducts were obtained, and three-dimensional MRCP images were rendered by post processing. CONTRAST:  66m MULTIHANCE GADOBENATE DIMEGLUMINE 529 MG/ML IV SOLN COMPARISON:  12/02/2017 FINDINGS: Lower chest: No acute findings. Hepatobiliary: Diffusely heterogeneous enhancement pattern of the liver is identified. There is a focal area of increased T2 signal within the region of the caudate lobe of liver which measures 2.4 by 2.5 cm, image 23/3. This demonstrates relative increased enhancement compared with the remainder of the liver, image 48/1301. Within the remaining portions of the liver there is heterogeneous attenuation and enhancement. There is marked intrahepatic bile duct dilatation. This demonstrates significant progression when compared with 11/18/17. Pancreas: Residual pancreas is largely atrophic with dilatation of the main duct, image 59/1303. The main duct measures up to 7 mm. Spleen:  Within normal limits in size and appearance. Adrenals/Urinary Tract: The adrenal glands are normal. Unremarkable appearance of the kidneys. Stomach/Bowel: The stomach and the remaining small bowel loops are unremarkable. Dilatation of the hepaticojejunostomy loop is identified. There appears to be marked narrowing of the hepaticojejunostomy loop at the point at which the crosses the midline (from right to left) between the aorta and SMV, image 72/1303. Findings may reflect underlying postsurgical scarring. Recurrent tumor not excluded. Vascular/Lymphatic:  Normal appearance of the abdominal aorta. Other:  No ascites or focal fluid collections identified. Musculoskeletal: No suspicious bone lesions identified. IMPRESSION: 1. Postoperative changes from Whipple procedure again noted. On today's study there is marked and progressive dilatation of the hepaticojejunostomy loop, bile ducts and main pancreatic duct. Focal area of narrowing of the hepaticojejunostomy loop as it crosses the midline from right to left.  Findings may represent underlying postsurgical scarring and stricture formation versus recurrent tumor. 2.  There is a focal area of abnormal signal in the region of the caudate lobe of liver with increased enhancement. It is unclear whether not this is abnormal signal and enhancement within the liver parenchyma or adenopathy. A PET-CT may be helpful to assess for recurrent metabolically active tumor. Electronically Signed   By: Kerby Moors M.D.   On: 12/03/2017 08:37   US Abdomen Limited Ruq  Result Date: 12/02/2017 CLINICAL DATA:  Abdominal pain.  History of pancreatic carcinoma EXAM: ULTRASOUND ABDOMEN LIMITED RIGHT UPPER QUADRANT COMPARISON:  CT abdomen and pelvis November 18, 2017 FINDINGS: Gallbladder: Surgically absent. Common bile duct: Diameter: 11 mm, prominent. No biliary duct mass or calculus is appreciable by ultrasound. Liver: No focal lesion identified. Liver echogenicity overall is increased. There is intrahepatic biliary duct dilatation. Portal vein is patent on color Doppler imaging with normal direction of blood flow towards the liver. IMPRESSION: 1. Gallbladder absent. There is biliary duct dilatation, similar to recent CT. No biliary duct mass or calculus evident. 2. Diffuse increase in liver echogenicity, a finding felt to be indicative of hepatic steatosis. While no focal liver lesions are evident, it must be cautioned that the sensitivity of ultrasound for detection of focal liver lesions is diminished significantly in this circumstance. Electronically Signed   By: Lowella Grip III M.D.   On: 12/02/2017 14:19    Labs:  CBC: Recent Labs    07/07/17 1145 12/02/17 1006 12/03/17 0515 12/04/17 0353 12/05/17 0536  WBC 3.8* 9.9 7.8 5.7 5.9  HGB 11.8*  --  8.6* 8.4* 8.8*  HCT 34.2* 31.6* 25.3* 24.5* 26.2*  PLT 191.0 241 235 282 272    COAGS: Recent Labs    12/02/17 1928 12/04/17 0353  INR 1.70 1.47  APTT 41*  --     BMP: Recent Labs    12/02/17 1006  12/03/17 0515 12/04/17 0353 12/05/17 0536  NA 129* 139 139 136  K 4.0 3.4* 3.5 3.8  CL 96* 108 107 102  CO2 _0 GLUCOSE 132 104* 69 58*  BUN _1 CALCIUM 9.1 8.1* 8.3* 8.4*  CREATININE 0.76 0.42* 0.52 0.54  GFRNONAA >60 >60 >60 >60  GFRAA >60 >60 >60 >60    LIVER FUNCTION TESTS: Recent Labs    12/02/17 1006 12/03/17 0515 12/04/17 0353 12/05/17 0536  BILITOT 3.4* 4.6* 2.7* 3.6*  AST 114* 120* 102* 104*  ALT 60* 57* 50 51  ALKPHOS 831* 566* 564* 585*  PROT 7.1 5.8* 5.7* 6.1*  ALBUMIN 2.9* 2.4* 2.5* 2.6*    Assessment and Plan: 72 y.o. 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  She now presents with persistent abdominal pain, primarily epigastric /left upper quadrant as well as nausea and some intermittent fevers.  MRI of the abdomen read today revealed:  1. Postoperative changes from Whipple procedure again noted. On today's study there is marked and progressive dilatation of the hepaticojejunostomy loop, bile ducts and main pancreatic duct. Focal area of narrowing of the hepaticojejunostomy loop as it crosses the midline from right to left. Findings may represent underlying postsurgical scarring and stricture formation versus recurrent tumor. 2. There is a focal area of abnormal signal in the region of the caudate lobe of liver with increased enhancement. It is unclear whether not this is abnormal signal and enhancement within  the liver parenchyma or adenopathy. A PET-CT may be helpful to assess for recurrent metabolically active tumor  Patient is currently afebrile and latest labs reveal slight increase in total bilirubin to 3.6 up from 2.7, creatinine normal, AST and alk phos up slightly as well, WBC normal, hemoglobin 8.8, platelets 272k.   Case reviewed again with Dr. Vernard Gambles and discussed with Dr. Barry Dienes again this morning.  We will plan on PTC with biliary drain placement after EGD this afternoon.  Details/risks of procedure discussed previously with patient and family, including but not limited to internal bleeding, infection/sepsis/need for prolonged drainage.  All questions answered.  Consent signed.     Electronically Signed: D. Rowe Robert, PA-C 12/05/2017, 10:13 AM   I spent a total of 20 minutes at the the patient's bedside AND on the patient's hospital floor or unit, greater than 50% of which was counseling/coordinating care for percutaneous transhepatic cholangiogram with biliary drain placement    Patient ID: [name], female   DOB: 25-Aug-1946, 72 y.o.   MRN: [id number]

## 2017-12-05 NOTE — Progress Notes (Signed)
PROGRESS NOTE    [name]  QMG:[id number] DOB: 16-Jul-1946 DOA: 12/02/2017 PCP: Chesley Noon, MD   Brief Narrative:  HPI Dr. [place] [name]:  [name] is a 72 y.o. 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  Presented with abdominal pain ongoing for 2-3 weeks. It was 7 out of 10 in intensity.  Sharp pain in the upper abdomen without any radiation.  No precipitating factors identified.  No aggravating or relieving factors.  She was seen at the cancer center.  It appears that she is been having the symptoms since the end of February.  At that time oncology was consider referring the patient to gastroenterology.  Patient was diagnosed with sinusitis and was placed on oral antibiotics but she developed diarrhea.  She saw her primary care physician yesterday and took a stool sample which was sent for C. difficile. Those results are not available yet.  But she stopped taking her antibiotics and her diarrhea has resolved.  Her abdominal pain however continues.  She has had some nausea but no vomiting.  Denies any facial pain.  Denies skin rashes shortness of breath cough.  Denies any joint pains.  Some pressure with urinating but denies any burning sensation.  Evaluation in the emergency department reveals that patient is febrile.  Patient was noted to have borderline low blood pressures.  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  Patient will need hospitalization for further management.  There is concern for sepsis.  *Patient still having abdominal Pain. Patient to was to undergo PTC and Biliary Drain placement yesterday but because of improvement in Labs, IR held off. Patient's labs  worsened and was still symptomatic so decision was made to proceed with EGD and PTC/External Drain afterwards.   Assessment & Plan:   Principal Problem:   Fever Active Problems:   Abdominal pain   Hyponatremia   Adenocarcinoma of head of pancreas (HCC)   Chronic anticoagulation   Anemia   Chronic atrial fibrillation (HCC)   Hypotension   Sepsis (HCC)   Afferent loop syndrome   Abnormal CT of the abdomen  Sepsis likely from Cholangitis with GNR Bacteremia -Etiology unclear but possibly GI/biliary in origin.   -Blood Cx Showed GNR in Anaerobic Bottle Only but BCID showed none detected; Await Final Result as nothing has grown  -Considering her abnormal LFTs and findings and CT scan cholangitis and obstructive duct needs to be considered.  Follow-up on culture data.   -D/C'd Vancomycin given GNR and will continue for now Zosyn for now.   -Lactic acid level is reassuringly normal and went from 1.41 -> 1.0 -Pro-calcitonin was 4.09. -Patient's Fever has improved and she has no White Count and WBC is 5.9 -IVF Rehydration with NS at 125 mL/hr now stopped  -Sepsis physiology improving  -Repeat CBC in AM   Abdominal Pain -CT scan showed Mildly progressive intrahepatic and extrahepatic ductal dilatation, as described above. Associated dilatation of the afferent limb. 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  -General Surgery has been consulted and recommended GI consult to eval the Afferent Limb and IR to see if they can decompress the biliary system with transhepatic drain -Gastroenterology consulted and MRCP done.See below.  -  IR Consulted and Percutaneous Transhepatic Cholangiogram and Biliary Drain Placement today after EGD   Abnormal liver function tests including elevated alkaline phosphatase, bilirubin and transaminases -AST went from 114 -> 120 -> 102 -> 104  -ALT went from 60 -> 57 -> 50 -> 51 -As discussed above.  -MRCP  showed Postoperative changes from Whipple procedure again noted. On today's study there is marked and progressive dilatation of the hepaticojejunostomy loop, bile ducts and main pancreatic duct. Focal area of narrowing of the hepaticojejunostomy loop as it crosses the midline from right to left. Findings may represent underlying postsurgical scarring and stricture formation versus recurrent Tumor. There was also a focal area of abnormal signal in the region of the caudate lobe of liver with increased enhancement. It is unclear whether not this is abnormal signal and enhancement within the liver parenchyma or adenopathy -Gastroenterology Consulted for further evaluation and treatment and recommending C/w IV Zosyn as IV Vaoncomycin D/C'd;  -GI to do EGD this AM  -IR consulted and will likely undergo Percutaneous Drainage of the Bile Duct today; Last dose of Xarelto 12/01/17.  -EGD will bed done toady to access the hepaticojejunostomy / Afferent loop for view of ? Biopsy of stricture area and further ascertain etiology of obsturtion.   Hypotension -She is asymptomatic and BP is improving and BP was 150/81 this AM  -D/C'd IVF Rehydration .  Hyponatremia -Improved. Was likely from Hypovolemia -Patient's Na+ went from 129 -> 139 -> 136 -IVF now D/C"d  -Continue to Monitor  -Repeat CMP in AM   Normocytic Anemia -Likely Hemo-concentrated on Admission and likley dilutional Drop now  -Likely Anemia of chronic disease.  No overt blood loss noted. -Hb/Hct went from 10.6/31.6 -> 8.6/25.3 -> 8.4/24.5 -> 8.8/26.2 -Continue to Monitor for S/Sx of Bleeding as she was previously Anticoagulated with Xarelto  -Repeat CBC in AM   Recent Diarrhea, improved  -Probably due to antibiotics.   -Had one episode last night.   -She took a stool sample to her PCP prior to Admission. Those results are not available to [place] yet.  But her diarrhea has resolved.   We will continue to monitor for no and doubt it is C  Difficile given no WBC.Marland Kitchen  History of Pancreatic Cancer -Followed by Oncology Dr. Julieanne Manson.  -Patient's CA 19-9 starting to elevate and went from 24 -> 316 -MRCP showed Postoperative changes from Whipple procedure again noted. On today's study there is marked and progressive dilatation of the hepaticojejunostomy loop, bile ducts and main pancreatic duct. Focal area of narrowing of the hepaticojejunostomy loop as it crosses the midline from right to left. Findings may represent underlying postsurgical scarring and stricture formation versus recurrent tumor. -Not on Chemotherapy currently.   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  -Pain control as before.  History of Paroxysmal Atrial Fibrillation -She is on Flecainide which will be continued at 100 mg po BID.  -Will resume her Home Metoprolol XL 12.5 mg po Daily  -She is on Xarelto which will be held in case the patient needs to undergo procedure.  Hypokalemia -Patient's K+ was 3.8 this AM -Continue to Monitor and Replete as Necessary -Repeat CMP in AM   Hyperbilirubinemia -Improving. T Bili went from 4.6 -> 2.7 -> 3.6 -As above -Repeat CMP in AM   DVT prophylaxis: SCDs  Code Status: DO NOT RESUSCITATE  Family Communication: Discussed with Husband at bedside  Disposition Plan: Remain Inpatient for continued work up and treatment   Consultants:  General Surgery  Gastroenterology  Oncology   IR  Procedures:  Pertcutaneous Transhepatic Cholangiogram with Biliary Drain  EGD   Antimicrobials:  Anti-infectives (From admission, onward)   Start     Dose/Rate Route Frequency Ordered Stop   12/02/17 2200  vancomycin (VANCOCIN) 500 mg in sodium chloride 0.9 % 100 mL IVPB  Status:  Discontinued     500 mg 100 mL/hr over 60 Minutes Intravenous Every 12 hours 12/02/17 1726 12/03/17 1857   12/02/17 2100  [MAR Hold]  piperacillin-tazobactam (ZOSYN) IVPB 3.375 g     (MAR Hold since 12/05/17 1258)    3.375 g 12.5 mL/hr over 240 Minutes Intravenous Every 8 hours 12/02/17 1724     12/02/17 1330  piperacillin-tazobactam (ZOSYN) IVPB 3.375 g  Status:  Discontinued     3.375 g 100 mL/hr over 30 Minutes Intravenous  Once 12/02/17 1323 12/02/17 1326   12/02/17 1330  vancomycin (VANCOCIN) IVPB 1000 mg/200 mL premix     1,000 mg 200 mL/hr over 60 Minutes Intravenous  Once 12/02/17 1323 12/02/17 1445     Subjective: Seen and examined and stated she had a rough night and was in a lot of abdominal pain. No CP or SOB. No nausea or vomiting but had an episode of diarrhea. Ready for procedure today.   Objective: Vitals:   12/05/17 1259 12/05/17 1451 12/05/17 1500 12/05/17 1510  BP: 138/78  100/80 126/61  Pulse: (!) 59 65 (!) 50 (!) 52  Resp: 12 (!) '26 17 16  ' Temp: 98.3 F (36.8 C) (!) 97.5 F (36.4 C)    TempSrc: Oral Oral    SpO2: 100% 100% 100% 100%  Weight: 69.9 kg (154 lb)     Height: '5\' 3"'  (1.6 m)       Intake/Output Summary (Last 24 hours) at 12/05/2017 1517 Last data filed at 12/05/2017 1443 Gross per 24 hour  Intake 810 ml  Output -  Net 810 ml   Filed Weights   12/05/17 1259  Weight: 69.9 kg (154 lb)    Examination: Physical Exam:  Constitutional: WN/WD Caucasian female in NAD appears slightly anxious and has a tearful mood.  Eyes: Sclerae anicteric. Lids normal  ENMT: External Ears and nose appear normal. Grossly normal hearing Neck: Supple with no JVD Respiratory: Diminished to auscultation; No wheezing/rales/rhonchi. Patient was not tachypenic or using any accessory muscles to breathe  Cardiovascular: RRR; No appreciable m/r/g. No extremity edema Abdomen: Soft, Tender to palpate diffusely. ND Bowel sounds present  GU: Deferred Musculoskeletal: No contractures; No cyanosis Skin: Warm and Dry; No appreciable rashes or lesions Neurologic: CN 2-12 grossly intact. No appreciable focal deficits Psychiatric: Tearful mood and affect and slightly anxious. Intact judgement  and insight  Data Reviewed: I have personally reviewed following labs and imaging studies  CBC: Recent Labs  Lab 12/02/17 1006 12/03/17 0515 12/04/17 0353 12/05/17 0536  WBC 9.9 7.8 5.7 5.9  NEUTROABS 8.7*  --  3.5 4.0  HGB  --  8.6* 8.4* 8.8*  HCT 31.6* 25.3* 24.5* 26.2*  MCV 88.0 86.9 88.8 87.9  PLT 241 235 282 563   Basic Metabolic Panel: Recent Labs  Lab 12/02/17 1006 12/03/17 0515 12/04/17 0353 12/05/17 0536  NA 129* 139 139 136  K 4.0 3.4* 3.5 3.8  CL 96* 108 107 102  CO2 '23 24 24 22  ' GLUCOSE 132 104* 69 58*  BUN '8 6 6 7  ' CREATININE 0.76 0.42* 0.52 0.54  CALCIUM 9.1 8.1* 8.3* 8.4*  MG  --  2.0 2.1 2.1  PHOS  --  3.0 2.7 3.4   GFR: Estimated Creatinine Clearance: 61.4 mL/min (by C-G formula based on SCr of 0.54 mg/dL). Liver Function Tests: Recent Labs  Lab 12/02/17 1006 12/03/17 0515 12/04/17 0353 12/05/17 0536  AST 114* 120* 102* 104*  ALT 60* 57* 50 51  ALKPHOS 831* 566* 564* 585*  BILITOT 3.4* 4.6* 2.7* 3.6*  PROT 7.1 5.8* 5.7* 6.1*  ALBUMIN 2.9* 2.4* 2.5* 2.6*   Recent Labs  Lab 12/02/17 1325  LIPASE 19   No results for input(s): AMMONIA in the last 168 hours. Coagulation Profile: Recent Labs  Lab 12/02/17 1928 12/04/17 0353  INR 1.70 1.47   Cardiac Enzymes: No results for input(s): CKTOTAL, CKMB, CKMBINDEX, TROPONINI in the last 168 hours. BNP (last 3 results) No results for input(s): PROBNP in the last 8760 hours. HbA1C: No results for input(s): HGBA1C in the last 72 hours. CBG: No results for input(s): GLUCAP in the last 168 hours. Lipid Profile: No results for input(s): CHOL, HDL, LDLCALC, TRIG, CHOLHDL, LDLDIRECT in the last 72 hours. Thyroid Function Tests: No results for input(s): TSH, T4TOTAL, FREET4, T3FREE, THYROIDAB in the last 72 hours. Anemia Panel: No results for input(s): VITAMINB12, FOLATE, FERRITIN, TIBC, IRON, RETICCTPCT in the last 72 hours. Sepsis Labs: Recent Labs  Lab 12/02/17 1344 12/02/17 1928  12/02/17 2323  PROCALCITON  --  4.09  --   LATICACIDVEN 1.41 1.0 1.0    Recent Results (from the past 240 hour(s))  Urine Culture     Status: None   Collection Time: 12/02/17 10:04 AM  Result Value Ref Range Status   Specimen Description   Final    URINE, CLEAN CATCH Performed at [facility], [place] [address] [place], [place] [address]    Special Requests   Final    NONE Performed at [facility], [place] [address], [place], [place] [address]    Culture   Final    NO GROWTH Performed at [facility], [place] [address], [place], [place] [address]    Report Status 12/03/2017 FINAL  Final  Culture, Blood     Status: Abnormal (Preliminary result)   Collection Time: 12/02/17 11:39 AM  Result Value Ref Range Status   Specimen Description BLOOD RIGHT ARM  Final   Special Requests   Final    BOTTLES DRAWN AEROBIC AND ANAEROBIC Blood Culture results may not be optimal due to an excessive volume of blood received in culture bottles   Culture  Setup Time   Final    GRAM NEGATIVE RODS ANAEROBIC BOTTLE ONLY CRITICAL RESULT CALLED TO, READ BACK BY AND VERIFIED WITH: C SHADE PHARMD 1801 12/03/17 A BROWNING    Culture (A)  Final    ANAEROBIC GRAM NEGATIVE ROD IDENTIFICATION TO FOLLOW BETA LACTAMASE POSITIVE Performed at [facility], [place] [address], [place], [place] [address]    Report Status PENDING  Incomplete  Blood Culture ID Panel (Reflexed)     Status: None   Collection Time: 12/02/17 11:39 AM  Result Value Ref Range Status   Enterococcus species NOT DETECTED NOT DETECTED Final   Listeria monocytogenes NOT DETECTED NOT DETECTED Final   Staphylococcus species NOT DETECTED NOT DETECTED Final   Staphylococcus aureus NOT DETECTED NOT DETECTED Final   Streptococcus species NOT DETECTED NOT DETECTED Final   Streptococcus agalactiae NOT DETECTED NOT DETECTED Final   Streptococcus pneumoniae NOT DETECTED NOT DETECTED Final    Streptococcus  pyogenes NOT DETECTED NOT DETECTED Final   Acinetobacter baumannii NOT DETECTED NOT DETECTED Final   Enterobacteriaceae species NOT DETECTED NOT DETECTED Final   Enterobacter cloacae complex NOT DETECTED NOT DETECTED Final   Escherichia coli NOT DETECTED NOT DETECTED Final   Klebsiella oxytoca NOT DETECTED NOT DETECTED Final   Klebsiella pneumoniae NOT DETECTED NOT DETECTED Final   Proteus species NOT DETECTED NOT DETECTED Final   Serratia marcescens NOT DETECTED NOT DETECTED Final   Haemophilus influenzae NOT DETECTED NOT DETECTED Final   Neisseria meningitidis NOT DETECTED NOT DETECTED Final   Pseudomonas aeruginosa NOT DETECTED NOT DETECTED Final   Candida albicans NOT DETECTED NOT DETECTED Final   Candida glabrata NOT DETECTED NOT DETECTED Final   Candida krusei NOT DETECTED NOT DETECTED Final   Candida parapsilosis NOT DETECTED NOT DETECTED Final   Candida tropicalis NOT DETECTED NOT DETECTED Final    Comment: Performed at [facility], [place] [address] [place], [place] [address]  Culture, Blood     Status: None (Preliminary result)   Collection Time: 12/02/17 11:50 AM  Result Value Ref Range Status   Specimen Description BLOOD PORTA CATH  Final   Special Requests   Final    BOTTLES DRAWN AEROBIC AND ANAEROBIC Blood Culture adequate volume   Culture   Final    NO GROWTH 3 DAYS Performed at [facility], [address] [place], [place] [address]    Report Status PENDING  Incomplete  Blood Culture (routine x 2)     Status: None (Preliminary result)   Collection Time: 12/02/17  1:27 PM  Result Value Ref Range Status   Specimen Description   Final    BLOOD LEFT PORTA CATH Performed at [place] [address] [place], [place] [address]    Special Requests   Final    BOTTLES DRAWN AEROBIC AND ANAEROBIC Blood Culture adequate volume Performed at [place] [address] [place], [place] [address]     Culture   Final    NO GROWTH 3 DAYS Performed at [facility], [place] [address], [place], [place] [address]    Report Status PENDING  Incomplete  Respiratory Panel by PCR     Status: None   Collection Time: 12/02/17  3:40 PM  Result Value Ref Range Status   Adenovirus NOT DETECTED NOT DETECTED Final   Coronavirus 229E NOT DETECTED NOT DETECTED Final   Coronavirus HKU1 NOT DETECTED NOT DETECTED Final   Coronavirus NL63 NOT DETECTED NOT DETECTED Final   Coronavirus OC43 NOT DETECTED NOT DETECTED Final   Metapneumovirus NOT DETECTED NOT DETECTED Final   Rhinovirus / Enterovirus NOT DETECTED NOT DETECTED Final   Influenza A NOT DETECTED NOT DETECTED Final   Influenza B NOT DETECTED NOT DETECTED Final   Parainfluenza Virus 1 NOT DETECTED NOT DETECTED Final   Parainfluenza Virus 2 NOT DETECTED NOT DETECTED Final   Parainfluenza Virus 3 NOT DETECTED NOT DETECTED Final   Parainfluenza Virus 4 NOT DETECTED NOT DETECTED Final   Respiratory Syncytial Virus NOT DETECTED NOT DETECTED Final   Bordetella pertussis NOT DETECTED NOT DETECTED Final   Chlamydophila pneumoniae NOT DETECTED NOT DETECTED Final   Mycoplasma pneumoniae NOT DETECTED NOT DETECTED Final    Comment: Performed at [facility], [place] [address], [place], [place] [address]    Radiology Studies: No results found. Scheduled Meds: . [MAR Hold] amitriptyline  50 mg Oral QHS  . fentaNYL      . [  MAR Hold] flecainide  100 mg Oral BID  . [MAR Hold] fluticasone  2 spray Each Nare Daily  . [MAR Hold] metoprolol succinate  12.5 mg Oral Daily  . midazolam       Continuous Infusions: . [MAR Hold] piperacillin-tazobactam (ZOSYN)  IV 3.375 g (12/05/17 1201)    LOS: 3 days   Kerney Elbe, DO [facility] Pager [phone]  If 7PM-7AM, please contact night-coverage [email or web address] Password [facility] 12/05/2017, 3:17 PM

## 2017-12-05 NOTE — Op Note (Signed)
[facility] Patient Name: [name] Procedure Date: 12/05/2017 MRN: [id number] Attending MD: Docia Chuck. Henrene Pastor , MD Date of Birth: 11-02-1945 CSN: [id number] Age: 72 Admit Type: Inpatient Procedure:                Upper GI endoscopy, with small bowel endoscopy Indications:              Generalized abdominal pain, Abnormal CT of the GI                            tract, Abnormal MRI of the GI tract. Afferent Limb                            syndrome raised Providers:                Docia Chuck. Henrene Pastor, MD, Cleda Daub, RN, Tinnie Gens,                            Technician, Adair Laundry, CRNA Referring MD:              Medicines:                Monitored Anesthesia Care Complications:            No immediate complications. Estimated Blood Loss:     Estimated blood loss: none. Procedure:                Pre-Anesthesia Assessment:                           - Prior to the procedure, a History and Physical                            was performed, and patient medications and                            allergies were reviewed. The patient's tolerance of                            previous anesthesia was also reviewed. The risks                            and benefits of the procedure and the sedation                            options and risks were discussed with the patient.                            All questions were answered, and informed consent                            was obtained. Prior Anticoagulants: The patient has                            taken no previous anticoagulant or antiplatelet  agents. ASA Grade Assessment: III - A patient with                            severe systemic disease. After reviewing the risks                            and benefits, the patient was deemed in                            satisfactory condition to undergo the procedure.                           After obtaining informed consent, the endoscope was                        passed under direct vision. Throughout the                            procedure, the patient's blood pressure, pulse, and                            oxygen saturations were monitored continuously. The                            (EG-[id number]) [id number] was introduced through the                            mouth, and advanced to the efferent jejunal loop.                            The EC-[id number] ([id number]) scope was introduced                            through the mouth, and advanced to the efferent                            jejunal loop. The upper GI endoscopy was                            accomplished without difficulty. The patient                            tolerated the procedure well. Scope In: Scope Out: Findings:      The esophagus was normal. The stomach revealed evidence of prior       surgery. The efferent limb was normal was deeply intubated. The afferent       limb was acutely angulated and could not be intubated despite multiple       maneuvers with both standard upper endoscope and ultrathin colonoscope       over the course of 45 minutes. Impression:               1. Status post Whipple                           2. Angulation  at the entrance of the afferent limb                            not permitting passage of the endoscope's despite                            extensive effort as described. Cannot rule out                            afferent loop syndrome Moderate Sedation:      none Recommendation:           1. Plans for percutaneous transhepatic biliary                            drainage noted                           2. Nothing additional to offer from GI medicine                            perspective at this point                           3. Ongoing management per general surgery and                            internal medicine and oncology. Procedure Code(s):        --- Professional ---                           [phone],  Esophagogastroduodenoscopy, flexible,                            transoral; diagnostic, including collection of                            specimen(s) by brushing or washing, when performed                            (separate procedure) Diagnosis Code(s):        --- Professional ---                           R10.84, Generalized abdominal pain                           R93.3, Abnormal findings on diagnostic imaging of                            other parts of digestive tract CPT copyright 2016 American [employer]. All rights reserved. The codes documented in this report are preliminary and upon coder review may  be revised to meet current compliance requirements. Docia Chuck. Henrene Pastor, MD 12/05/2017 3:01:19 PM This report has been signed electronically. Number of Addenda: 0

## 2017-12-05 NOTE — Anesthesia Postprocedure Evaluation (Signed)
Anesthesia Post Note  Patient: [name]  Procedure(s) Performed: ESOPHAGOGASTRODUODENOSCOPY (EGD) WITH PROPOFOL (N/A )     Patient location during evaluation: PACU Anesthesia Type: MAC Level of consciousness: awake and alert Pain management: pain level controlled Vital Signs Assessment: post-procedure vital signs reviewed and stable Respiratory status: spontaneous breathing, nonlabored ventilation, respiratory function stable and patient connected to nasal cannula oxygen Cardiovascular status: stable and blood pressure returned to baseline Postop Assessment: no apparent nausea or vomiting Anesthetic complications: no    Last Vitals:  Vitals:   12/05/17 1500 12/05/17 1510  BP: 100/80 126/61  Pulse: (!) 50 (!) 52  Resp: 17 16  Temp:    SpO2: 100% 100%    Last Pain:  Vitals:   12/05/17 1451  TempSrc: Oral  PainSc:                  Barnet [place]

## 2017-12-05 NOTE — Procedures (Signed)
  Procedure: Perc transhepatic cholangiogram, Perc biliary int/ext drain 11f EBL:   minimal Complications:  none immediate  See full dictation in [facility].  Dillard Cannon MD Main # [phone] Pager  [phone]

## 2017-12-05 NOTE — Transfer of Care (Signed)
Immediate Anesthesia Transfer of Care Note  Patient: [name]  Procedure(s) Performed: ESOPHAGOGASTRODUODENOSCOPY (EGD) WITH PROPOFOL (N/A )  Patient Location: Endoscopy Unit  Anesthesia Type:MAC  Level of Consciousness: drowsy  Airway & Oxygen Therapy: Patient Spontanous Breathing and Patient connected to nasal cannula oxygen  Post-op Assessment: Report given to RN and Post -op Vital signs reviewed and stable  Post vital signs: Reviewed and stable  Last Vitals:  Vitals:   12/05/17 0547 12/05/17 1259  BP: 124/67 138/78  Pulse: 60 (!) 59  Resp: 20 12  Temp: 36.9 C 36.8 C  SpO2: 96% 100%    Last Pain:  Vitals:   12/05/17 1259  TempSrc: Oral  PainSc: 4       Patients Stated Pain Goal: 2 (14/48/18 5631)  Complications: No apparent anesthesia complications

## 2017-12-05 NOTE — Progress Notes (Signed)
Pharmacy Antibiotic Note  [name] is a 72 y.o. female admitted on 12/02/2017 with sepsis with c/o ongoing abdominal pain.  PMH significant for pancreatic cancer (s/p Whipple procedure), bile duct stent placement (2015), AFib, PE.  In the ED, patient received Vancomycin 1gm IV and Cefepime 2gm IV x 1 dose each.  Upon admission, Pharmacy has been consulted for Vancomcyin and Zosyn dosing.  Vancomycin has been subsequently stopped and pt is on Zosyn  Plan: Continue  Zosyn 3.375g IV q8h (4 hour infusion).   Temp (24hrs), Avg:98.3 F (36.8 C), Min:98 F (36.7 C), Max:98.5 F (36.9 C)  Recent Labs  Lab 12/02/17 1006 12/02/17 1344 12/02/17 1928 12/02/17 2323 12/03/17 0515 12/04/17 0353 12/05/17 0536  WBC 9.9  --   --   --  7.8 5.7 5.9  CREATININE 0.76  --   --   --  0.42* 0.52 0.54  LATICACIDVEN  --  1.41 1.0 1.0  --   --   --     Estimated Creatinine Clearance: 61.4 mL/min (by C-G formula based on SCr of 0.54 mg/dL).    Allergies  Allergen Reactions  . Ace Inhibitors     Cough  . Scopolamine Other (See Comments)    Dizzy, "lost control of my body", fell down and cracked a rib  . Sulfa Antibiotics Hives    Antimicrobials this admission: 3/12 Cefepime x 1 dose  3/12 Vanc >>  3/13 3/12 Zosyn >>  Dose adjustments this admission:    Microbiology results: 3/12 BCx: Beta lactamase anaerobic Gr- rods 3/12 UCx: NGF  3/12 Resp Panel PCR: negative   Thank you for allowing pharmacy to be a part of this patient's care.   Royetta Asal, PharmD, BCPS Pager [phone] 12/05/2017 2:54 PM

## 2017-12-05 NOTE — Progress Notes (Addendum)
Patient ID: [name], female   DOB: 1945-10-29, 72 y.o.   MRN: [id number]     Progress Note   Subjective    Pain is the same -fairly intense  Epigastrium - she is somewhat frustrated -thought she was going to have PTC/drainage yesterday   Tearful , scared this am-trying to be strong, has strong faith   Objective   Vital signs in last 24 hours: Temp:  [98 F (36.7 C)-98.5 F (36.9 C)] 98.5 F (36.9 C) (03/15 0547) Pulse Rate:  [55-65] 60 (03/15 0547) Resp:  [18-20] 20 (03/15 0547) BP: (114-128)/(67-87) 124/67 (03/15 0547) SpO2:  [96 %-97 %] 96 % (03/15 0547) Last BM Date: 12/03/17 General:    white female in NAD, husband at bedside Heart:  Regular rate and rhythm; no murmurs Lungs: Respirations even and unlabored, lungs CTA bilaterally Abdomen:  Soft,tender epigastrium and upper abdomen and nondistended. Normal bowel sounds. Extremities:  Without edema. Neurologic:  Alert and oriented,  grossly normal neurologically. Psych:  Cooperative. Normal mood and affect.  Intake/Output from previous day: 03/14 0701 - 03/15 0700 In: 10 [I.V.:10] Out: -  Intake/Output this shift: No intake/output data recorded.  Lab Results: Recent Labs    12/03/17 0515 12/04/17 0353 12/05/17 0536  WBC 7.8 5.7 5.9  HGB 8.6* 8.4* 8.8*  HCT 25.3* 24.5* 26.2*  PLT 235 282 272   BMET Recent Labs    12/03/17 0515 12/04/17 0353 12/05/17 0536  NA 139 139 136  K 3.4* 3.5 3.8  CL 108 107 102  CO2 24 24 22   GLUCOSE 104* 69 58*  BUN 6 6 7   CREATININE 0.42* 0.52 0.54  CALCIUM 8.1* 8.3* 8.4*   LFT Recent Labs    12/05/17 0536  PROT 6.1*  ALBUMIN 2.6*  AST 104*  ALT 51  ALKPHOS 585*  BILITOT 3.6*   PT/INR Recent Labs    12/02/17 1928 12/04/17 0353  LABPROT 19.8* 17.7*  INR 1.70 1.47    BC no growth so far    Assessment / Plan:    #75 72 year old white female with pancreatic adenocarcinoma diagnosed 2015, stage IB status post Whipple procedure December 2015. Patient  had evidence of recurrence in September 2017 with a soft tissue mass at the root of the mesentery, treated with SBR T then chemotherapy. Patient has developed increased abdominal pain over the past 4-5 months, EUS with celiac block ineffective October 2018. Now presenting with worsening epigastric pain over the past couple of weeks, intermittent fevers and temp to 102 is prior to admission. CA-19-9 has been on the rise-most recent value above 300 Extensive imaging has been done with finding of marked narrowing of the hepaticojejunostomy loop, question closed loop syndrome versus tumor , She has marked intrahepatic ductal dilation and bile duct dilation with imaging showing significant progression over the last couple of weeks.. Concern for early cholangitis on admission Cultures negative thus far Patient has been on Zosyn  IR has been consulted and initially PTC was planned for yesterday however she had some improvement in bilirubin decision was made to observe. T bili back up to 3.6 today  The patient also needs to have EGD done to evaluate the hepaticojejunostomy loop to see if it can be determined whether obstruction is secondary to malignancy or anastomotic stricturing.  We will go ahead with EGD this morning with Dr. Henrene Pastor. Discussed with patient in detail this morning Still believe she will need PTC/external drainage but will defer timing to IR  Contact  Amy Esterwood, P.A.-C               [phone]   Principal Problem:   Fever Active Problems:   Abdominal pain   Hyponatremia   Adenocarcinoma of head of pancreas (HCC)   Chronic anticoagulation   Anemia   Chronic atrial fibrillation (HCC)   Hypotension   Sepsis ([place])     LOS: 3 days   Amy Esterwood  12/05/2017, 8:58 AM  GI ATTENDING  Interval history data reviewed. Patient seen and examined at bedside. Plans for upper endoscopy to try to evaluate the aferrent limb.The nature of the procedure, as well as the risks,  benefits, and alternatives were carefully and thoroughly reviewed with the patient. Ample time for discussion and questions allowed. The patient understood, was satisfied, and agreed to proceed.  Docia Chuck. Geri Seminole., M.D. [facility] Division of Gastroenterology

## 2017-12-06 DIAGNOSIS — Z7901 Long term (current) use of anticoagulants: Secondary | ICD-10-CM

## 2017-12-06 LAB — CULTURE, BLOOD (SINGLE)

## 2017-12-06 LAB — CBC WITH DIFFERENTIAL/PLATELET
Basophils Absolute: 0 10*3/uL (ref 0.0–0.1)
Basophils Relative: 0 %
EOS PCT: 4 %
Eosinophils Absolute: 0.2 10*3/uL (ref 0.0–0.7)
HCT: 27.4 % — ABNORMAL LOW (ref 36.0–46.0)
Hemoglobin: 9.2 g/dL — ABNORMAL LOW (ref 12.0–15.0)
LYMPHS ABS: 1.2 10*3/uL (ref 0.7–4.0)
Lymphocytes Relative: 19 %
MCH: 29.7 pg (ref 26.0–34.0)
MCHC: 33.6 g/dL (ref 30.0–36.0)
MCV: 88.4 fL (ref 78.0–100.0)
MONOS PCT: 7 %
Monocytes Absolute: 0.5 10*3/uL (ref 0.1–1.0)
Neutro Abs: 4.5 10*3/uL (ref 1.7–7.7)
Neutrophils Relative %: 70 %
PLATELETS: 335 10*3/uL (ref 150–400)
RBC: 3.1 MIL/uL — AB (ref 3.87–5.11)
RDW: 14.6 % (ref 11.5–15.5)
WBC: 6.4 10*3/uL (ref 4.0–10.5)

## 2017-12-06 LAB — COMPREHENSIVE METABOLIC PANEL
ALT: 46 U/L (ref 14–54)
AST: 80 U/L — ABNORMAL HIGH (ref 15–41)
Albumin: 2.6 g/dL — ABNORMAL LOW (ref 3.5–5.0)
Alkaline Phosphatase: 609 U/L — ABNORMAL HIGH (ref 38–126)
Anion gap: 14 (ref 5–15)
BUN: 7 mg/dL (ref 6–20)
CALCIUM: 8.4 mg/dL — AB (ref 8.9–10.3)
CO2: 22 mmol/L (ref 22–32)
CREATININE: 0.55 mg/dL (ref 0.44–1.00)
Chloride: 101 mmol/L (ref 101–111)
GFR calc non Af Amer: >60 mL/min (ref >60)
Glucose, Bld: 82 mg/dL (ref 65–99)
Potassium: 3.5 mmol/L (ref 3.5–5.1)
Sodium: 137 mmol/L (ref 135–145)
TOTAL PROTEIN: 6.2 g/dL — AB (ref 6.5–8.1)
Total Bilirubin: 2.9 mg/dL — ABNORMAL HIGH (ref 0.3–1.2)

## 2017-12-06 LAB — PHOSPHORUS: Phosphorus: 3.3 mg/dL (ref 2.5–4.6)

## 2017-12-06 LAB — MAGNESIUM: MAGNESIUM: 2 mg/dL (ref 1.7–2.4)

## 2017-12-06 NOTE — Progress Notes (Addendum)
Patient ID: [name], female   DOB: 1946-02-15, 72 y.o.   MRN: [id number] [facility] Progress Note:   1 Day Post-Op  Subjective: Mental status is clear;  Feeling much better today Objective: Vital signs in last 24 hours: Temp:  [97.5 F (36.4 C)-98.8 F (37.1 C)] 98.5 F (36.9 C) (03/16 0628) Pulse Rate:  [50-66] 55 (03/16 0628) Resp:  [12-26] 18 (03/16 0628) BP: (100-154)/(50-102) 100/50 (03/16 0628) SpO2:  [95 %-100 %] 96 % (03/16 0628) Weight:  [69.9 kg (154 lb)] 69.9 kg (154 lb) (03/15 1259)  Intake/Output from previous day: 03/15 0701 - 03/16 0700 In: 1150 [P.O.:240; I.V.:800; IV Piggyback:100] Out: 650 [Drains:650] Intake/Output this shift: No intake/output data recorded.  Physical Exam: Work of breathing is not labored;  Abdominal pain appears resolved  Lab Results:  Results for orders placed or performed during the hospital encounter of 12/02/17 (from the past 48 hour(s))  Magnesium     Status: None   Collection Time: 12/05/17  5:36 AM  Result Value Ref Range   Magnesium 2.1 1.7 - 2.4 mg/dL    Comment: Performed at [facility], [place] [address] [place], [place] [address]  Phosphorus     Status: None   Collection Time: 12/05/17  5:36 AM  Result Value Ref Range   Phosphorus 3.4 2.5 - 4.6 mg/dL    Comment: Performed at [facility], [place] [address], [place], [place] [address]  CBC with Differential/Platelet     Status: Abnormal   Collection Time: 12/05/17  5:36 AM  Result Value Ref Range   WBC 5.9 4.0 - 10.5 K/uL   RBC 2.98 (L) 3.87 - 5.11 MIL/uL   Hemoglobin 8.8 (L) 12.0 - 15.0 g/dL   HCT 26.2 (L) 36.0 - 46.0 %   MCV 87.9 78.0 - 100.0 fL   MCH 29.5 26.0 - 34.0 pg   MCHC 33.6 30.0 - 36.0 g/dL   RDW 14.6 11.5 - 15.5 %   Platelets 272 150 - 400 K/uL   Neutrophils Relative % 67 %   Neutro Abs 4.0 1.7 - 7.7 K/uL   Lymphocytes Relative 21 %   Lymphs Abs 1.2 0.7 - 4.0 K/uL   Monocytes Relative 8 %   Monocytes Absolute 0.4 0.1 - 1.0 K/uL   Eosinophils Relative 4 %   Eosinophils Absolute 0.2 0.0 - 0.7 K/uL   Basophils Relative 0 %   Basophils Absolute 0.0 0.0 - 0.1 K/uL    Comment: Performed at [facility], [place] [address], [place], [place] [address]  Comprehensive metabolic panel     Status: Abnormal   Collection Time: 12/05/17  5:36 AM  Result Value Ref Range   Sodium 136 135 - 145 mmol/L   Potassium 3.8 3.5 - 5.1 mmol/L   Chloride 102 101 - 111 mmol/L   CO2 22 22 - 32 mmol/L   Glucose, Bld 58 (L) 65 - 99 mg/dL   BUN 7 6 - 20 mg/dL   Creatinine, Ser 0.54 0.44 - 1.00 mg/dL   Calcium 8.4 (L) 8.9 - 10.3 mg/dL   Total Protein 6.1 (L) 6.5 - 8.1 g/dL   Albumin 2.6 (L) 3.5 - 5.0 g/dL   AST 104 (H) 15 - 41 U/L   ALT 51 14 - 54 U/L   Alkaline Phosphatase 585 (H) 38 - 126 U/L   Total Bilirubin 3.6 (H) 0.3 - 1.2 mg/dL   GFR calc non Af Amer >60 >60 mL/min  GFR calc Af Amer >60 >60 mL/min    Comment: (NOTE) The eGFR has been calculated using the CKD EPI equation. This calculation has not been validated in all clinical situations. eGFR's persistently <60 mL/min signify possible Chronic Kidney Disease.    Anion gap 12 5 - 15    Comment: Performed at [facility], [place] [address], [place], [place] [address]  CBC with Differential/Platelet     Status: Abnormal   Collection Time: 12/06/17  3:34 AM  Result Value Ref Range   WBC 6.4 4.0 - 10.5 K/uL   RBC 3.10 (L) 3.87 - 5.11 MIL/uL   Hemoglobin 9.2 (L) 12.0 - 15.0 g/dL   HCT 27.4 (L) 36.0 - 46.0 %   MCV 88.4 78.0 - 100.0 fL   MCH 29.7 26.0 - 34.0 pg   MCHC 33.6 30.0 - 36.0 g/dL   RDW 14.6 11.5 - 15.5 %   Platelets 335 150 - 400 K/uL   Neutrophils Relative % 70 %   Neutro Abs 4.5 1.7 - 7.7 K/uL   Lymphocytes Relative 19 %   Lymphs Abs 1.2 0.7 - 4.0 K/uL   Monocytes Relative 7 %   Monocytes Absolute 0.5 0.1 - 1.0 K/uL   Eosinophils Relative 4 %   Eosinophils Absolute 0.2 0.0 - 0.7 K/uL    Basophils Relative 0 %   Basophils Absolute 0.0 0.0 - 0.1 K/uL    Comment: Performed at [facility], [place] [address], [place], [place] [address]  Comprehensive metabolic panel     Status: Abnormal   Collection Time: 12/06/17  3:34 AM  Result Value Ref Range   Sodium 137 135 - 145 mmol/L   Potassium 3.5 3.5 - 5.1 mmol/L   Chloride 101 101 - 111 mmol/L   CO2 22 22 - 32 mmol/L   Glucose, Bld 82 65 - 99 mg/dL   BUN 7 6 - 20 mg/dL   Creatinine, Ser 0.55 0.44 - 1.00 mg/dL   Calcium 8.4 (L) 8.9 - 10.3 mg/dL   Total Protein 6.2 (L) 6.5 - 8.1 g/dL   Albumin 2.6 (L) 3.5 - 5.0 g/dL   AST 80 (H) 15 - 41 U/L   ALT 46 14 - 54 U/L   Alkaline Phosphatase 609 (H) 38 - 126 U/L   Total Bilirubin 2.9 (H) 0.3 - 1.2 mg/dL   GFR calc non Af Amer >60 >60 mL/min   GFR calc Af Amer >60 >60 mL/min    Comment: (NOTE) The eGFR has been calculated using the CKD EPI equation. This calculation has not been validated in all clinical situations. eGFR's persistently <60 mL/min signify possible Chronic Kidney Disease.    Anion gap 14 5 - 15    Comment: Performed at [facility], [place] [address], [place], [place] [address]  Magnesium     Status: None   Collection Time: 12/06/17  3:34 AM  Result Value Ref Range   Magnesium 2.0 1.7 - 2.4 mg/dL    Comment: Performed at [facility], [place] [address], [place], [place] [address]  Phosphorus     Status: None   Collection Time: 12/06/17  3:34 AM  Result Value Ref Range   Phosphorus 3.3 2.5 - 4.6 mg/dL    Comment: Performed at [facility], [place] [address], [place], [place] [address]    Radiology/Results: Ir Percutaneous Transhepatic Cholangiogram  Result Date: 12/05/2017 INDICATION: Pancreatic carcinoma status post Whipple, now with epigastric pain and dilatation  of the hepaticojejunostomy loop and bile ducts. Elevated bilirubin. Decompression is requested. EXAM: PERCUTANEOUS TRANSHEPATIC  CHOLANGIOGRAM PERCUTANEOUS INTERNAL/EXTERNAL BILIARY DRAIN MEDICATIONS: Patient was already receiving adequate prophylactic antibiotic coverage. ANESTHESIA/SEDATION: Intravenous Fentanyl and Versed were administered as conscious sedation during continuous monitoring of the patient's level of consciousness and physiological / cardiorespiratory status by the radiology RN, with a total moderate sedation time of 23 minutes. PROCEDURE: Informed written consent was obtained from the patient after a thorough discussion of the procedural risks, benefits and alternatives. All questions were addressed. Maximal Sterile Barrier Technique was utilized including caps, mask, sterile gowns, sterile gloves, sterile drape, hand hygiene and skin antiseptic. A timeout was performed prior to the initiation of the procedure. Limited ultrasound confirmed intrahepatic biliary ductal dilatation in the left lobe. An appropriate skin entry site was determined. Under real-time ultrasound guidance, a 21 gauge needle advanced into a dilated peripheral left lateral segment duct. Bowel spontaneously returned through the needle hub. A 018 guidewire advanced centrally easily. A 6 French transitional dilator was advanced over the wire. Contrast injection for percutaneous transhepatic cholangiogram was performed. This confirmed placement within the central intrahepatic biliary tree. There is intra and extrahepatic biliary ductal dilatation. Contrast did flow across the anastomosis at the hepaticojejunostomy, with dilatation of the bowel. The dilator was exchanged over a Bentson wire for a 5 [place] Kumpe catheter, advanced into the jejunal loop. Catheter exchanged over an Amplatz wire for vascular dilator which facilitated advancement of a 10 [place] internal external biliary drain catheter, placed with sideholes spanning the intrahepatic biliary tree, extending into the anastomosed jejunal loop. Contrast injection confirmed good position and patency.  The catheter was secured externally with 0 Prolene suture and StatLock and placed to external drainage. The patient tolerated the procedure well. FLUOROSCOPY TIME:  2 minutes 54 seconds; 47 mGy COMPLICATIONS: None immediate IMPRESSION: 1. Percutaneous transhepatic cholangiogram demonstrates intra and extrahepatic biliary ductal dilatation with patency of the hepaticojejunostomy anastomosis. 2. Technically successful internal/external biliary drain catheter placement as above. PLAN: Allow biliary decompression with internal /external drainage. Follow bilirubin. We can do a follow-up cholangiogram next week to assess the level of obstruction, possible brush biopsy. Electronically Signed   By: Lucrezia Europe M.D.   On: 12/05/2017 17:36    Anti-infectives: Anti-infectives (From admission, onward)   Start     Dose/Rate Route Frequency Ordered Stop   12/02/17 2200  vancomycin (VANCOCIN) 500 mg in sodium chloride 0.9 % 100 mL IVPB  Status:  Discontinued     500 mg 100 mL/hr over 60 Minutes Intravenous Every 12 hours 12/02/17 1726 12/03/17 1857   12/02/17 2100  piperacillin-tazobactam (ZOSYN) IVPB 3.375 g     3.375 g 12.5 mL/hr over 240 Minutes Intravenous Every 8 hours 12/02/17 1724     12/02/17 1330  piperacillin-tazobactam (ZOSYN) IVPB 3.375 g  Status:  Discontinued     3.375 g 100 mL/hr over 30 Minutes Intravenous  Once 12/02/17 1323 12/02/17 1326   12/02/17 1330  vancomycin (VANCOCIN) IVPB 1000 mg/200 mL premix     1,000 mg 200 mL/hr over 60 Minutes Intravenous  Once 12/02/17 1323 12/02/17 1445      Assessment/Plan: Problem List: Patient Active Problem List   Diagnosis Date Noted  . Afferent loop syndrome   . Abnormal CT of the abdomen   . Fever 12/02/2017  . Chronic atrial fibrillation ([place]) 12/02/2017  . Hypotension 12/02/2017  . Sepsis ([place]) 12/02/2017  . Left upper quadrant pain 06/27/2017  . Acute bronchitis 01/25/2017  .  Leukocytosis 01/25/2017  . Diarrhea 11/28/2016  . Port  catheter in place 01/11/2016  . Paroxysmal atrial fibrillation ([place]) 08/02/2015  . Dizziness 10/15/2014  . Long term current use of anticoagulant therapy 10/12/2014  . Anxiety 10/12/2014  . Dehydration 10/12/2014  . Nausea with vomiting 10/12/2014  . Hyperglycemia 09/17/2014  . Anemia 09/17/2014  . Pulmonary embolism ([place])   . Protein-calorie malnutrition, severe ([place]) 09/16/2014  . Nausea and vomiting 09/14/2014  . Genetic testing 09/08/2014  . Atrial fibrillation with RVR post op-  09/05/2014  . Chronic anticoagulation 09/05/2014  . Adenocarcinoma of head of pancreas ([place]) 08/30/2014  . Malignant neoplasm of pancreas ([place]) 08/09/2014  . 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 Acute pancreatitis 06/19/2014  . Obesity (BMI 30-39.9) 03/24/2014  . Gallstones 03/24/2014  . Right-sided back pain 03/24/2014  . Tubular adenoma of colon   . Chronic gastritis   . Endometrial ca ([place]) 06/03/2013  . Postoperative anemia due to acute blood loss 05/04/2013  . Hyponatremia 05/04/2013  . OA (osteoarthritis) of knee 01/13/2013  . Cervical facet syndrome 06/05/2012  . Abdominal pain 12/05/2011  . HTN (hypertension) 11/11/2011  . OSA (obstructive sleep apnea) 11/11/2011  . PAF-NSR on Flec prior to adm 10/23/2011    Had stents/perc drains placed yesterday and feels much better today.  Will continue to monitor progress.   1 Day Post-Op    LOS: 4 days   Matt B. Hassell Done, MD, [facility] [phone] beeper [phone]  12/06/2017 10:19 AM

## 2017-12-06 NOTE — Progress Notes (Signed)
Referring Physician(s): Crump  Supervising Physician: Daryll Brod  Patient Status:  [facility] - In-pt  Chief Complaint:  Panc cancer   Subjective:  Procedure: Perc transhepatic cholangiogram, Perc biliary int/ext drain 80f Feels so much better already today OP bilious  Allergies: Ace inhibitors; Scopolamine; and Sulfa antibiotics  Medications: Prior to Admission medications   Medication Sig Start Date End Date Taking? Authorizing Provider  acetaminophen (TYLENOL) 500 MG tablet Take 1,000 mg by mouth every 6 (six) hours as needed for pain.    Yes [provider]  albuterol (PROVENTIL HFA;VENTOLIN HFA) 108 (90 Base) MCG/ACT inhaler Inhale 2 puffs into the lungs every 6 (six) hours as needed for wheezing or shortness of breath. 01/27/17  Yes Mikhail, Velta Addison, DO  amitriptyline (ELAVIL) 25 MG tablet Take 2 tablets (50 mg total) by mouth at bedtime. 10/23/17  Yes Ladell Pier, MD  diphenhydrAMINE (BENADRYL) 25 MG tablet Take 50 mg by mouth every 6 (six) hours as needed for itching or allergies.    Yes [provider]  diphenoxylate-atropine (LOMOTIL) 2.5-0.025 MG tablet Take 1-2 tablets by mouth 4 (four) times daily as needed for diarrhea or loose stools. 01/21/17  Yes Ladell Pier, MD  flecainide (TAMBOCOR) 100 MG tablet TAKE 1 TABLET BY MOUTH TWICE A DAY 02/24/17  Yes Eileen Stanford, PA-C  fluticasone (FLONASE) 50 MCG/ACT nasal spray Place 1 spray into both nostrils 2 (two) times daily as needed for allergies.    Yes [provider]  guaiFENesin (MUCINEX) 600 MG 12 hr tablet Take 600 mg by mouth every morning.    Yes [provider]  lidocaine-prilocaine (EMLA) cream Apply small amount over port area 1-2 hours prior to treatment and cover with plastic wrap.  DO NOT RUB IN. 07/09/16  Yes Ladell Pier, MD  LORazepam (ATIVAN) 1 MG tablet Take 0.5 tablets (0.5 mg total) by mouth every 8 (eight) hours as needed for anxiety. 05/10/15  Yes  Owens Shark, NP  metoprolol succinate (TOPROL-XL) 25 MG 24 hr tablet Take 12.5 mg by mouth daily.   Yes [provider]  morphine (MSIR) 15 MG tablet Take 1 tablet (15 mg total) by mouth every 4 (four) hours as needed for severe pain. 11/19/17  Yes Ladell Pier, MD  promethazine (PHENERGAN) 12.5 MG tablet Take 1 tablet (12.5 mg total) by mouth every 6 (six) hours as needed for nausea or vomiting. 06/03/17  Yes Ladell Pier, MD  XARELTO 20 MG TABS tablet TAKE 1 TABLET BY MOUTH EVERY DAY 07/14/17  Yes Jerline Pain, MD  zolpidem (AMBIEN CR) 6.25 MG CR tablet Take 1 tablet (6.25 mg total) by mouth at bedtime. 02/26/17  Yes Ladell Pier, MD  HYDROcodone-acetaminophen (NORCO/VICODIN) 5-325 MG tablet Take 1-2 tablets by mouth every 4 (four) hours as needed for moderate pain. Patient not taking: Reported on 12/02/2017 11/19/17   Ladell Pier, MD  lipase/protease/amylase (CREON) 12000 UNITS CPEP capsule Take 2 capsules (24,000 Units total) by mouth 3 (three) times daily before meals. Patient not taking: Reported on 12/02/2017 01/05/15   Owens Shark, NP     Vital Signs: BP 115/66   Pulse (!) 58   Temp 98.5 F (36.9 C) (Oral)   Resp 18   Ht 5\' 3"  (1.6 m)   Wt 154 lb (69.9 kg)   LMP  (LMP Unknown)   SpO2 96%   BMI 27.28 kg/m   Physical Exam  Constitutional: She is  oriented to person, place, and time.  Abdominal: Soft. Normal appearance and bowel sounds are normal. There is no tenderness.  Neurological: She is alert and oriented to person, place, and time.  Skin: Skin is warm and dry.  Site is clean and dry Tender No bleeding OP bilious 650 cc yesterday; 350cc recorded today- 300 cc in bag  Psychiatric: She has a normal mood and affect. Her behavior is normal.  Nursing note and vitals reviewed.   Imaging: Dg Chest 2 View  Result Date: 12/02/2017 CLINICAL DATA:  Flu like symptoms EXAM: CHEST - 2 VIEW COMPARISON:  Chest CT 01/25/2017 and CXR 01/25/2017 FINDINGS:  The heart size and mediastinal contours are within normal limits. Chronic mild interstitial prominence without alveolar consolidation, dominant mass, effusion or pneumothorax. Left-sided port catheter is noted with tip in the mid SVC. No acute osseous abnormality. Partially visualized left shoulder arthroplasty. IMPRESSION: No active cardiopulmonary disease. Electronically Signed   By: Ashley Royalty M.D.   On: 12/02/2017 17:16   Ct Abdomen Pelvis W Contrast  Result Date: 12/02/2017 CLINICAL DATA:  Acute abdominal pain. Hypotension. Fever/chills. Pancreatic cancer diagnosed 2015. Endometrial cancer diagnosed 2012. EXAM: CT ABDOMEN AND PELVIS WITH CONTRAST TECHNIQUE: Multidetector CT imaging of the abdomen and pelvis was performed using the standard protocol following bolus administration of intravenous contrast. CONTRAST:  111mL ISOVUE-300 IOPAMIDOL (ISOVUE-300) INJECTION 61% COMPARISON:  11/18/2017. FINDINGS: Lower chest: Lung bases are clear. Hepatobiliary: No suspicious/enhancing hepatic lesions. Status post Whipple procedure with choledochojejunostomy. Mild intrahepatic and extrahepatic ductal dilatation. Dilated common duct, measuring 14 mm centrally (coronal image 44, previously 11 mm. Pancreas: Status post Whipple procedure with pancreaticojejunostomy. Atrophy of the residual pancreatic body/tail with mild pancreatic duct dilatation (series 2/image 24). Spleen: Within normal limits. Adrenals/Urinary Tract: Adrenal glands within normal limits. 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 Left kidney is within normal limits. No hydronephrosis. Bladder is within normal limits. Stomach/Bowel: Status post partial gastrectomy with gastrojejunostomy. Mildly dilated afferent limb in the right upper quadrant (series 2/image 29), although with improved wall thickening when compared to the prior CT. No evidence of bowel obstruction. Appendix is not discretely visualized. Vascular/Lymphatic: No evidence of abdominal aortic  aneurysm. Atherosclerotic calcifications of the abdominal aorta and branch vessels. No suspicious abdominopelvic lymphadenopathy. Reproductive: Status post hysterectomy. No adnexal masses. Other: No abdominopelvic ascites. Musculoskeletal: Mild superior endplate Schmorl's node deformities at T10, T11, and L5. IMPRESSION: Mildly progressive intrahepatic and extrahepatic ductal dilatation, as described above. Associated dilatation of the afferent limb. 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 GI/surgical consultation is suggested. No findings specific for recurrent or metastatic disease in this patient with history of pancreatic cancer and endometrial cancer. Electronically Signed   By: Julian Hy M.D.   On: 12/02/2017 15:13   Mr 3d Recon At Scanner  Result Date: 12/03/2017 CLINICAL DATA:  Evaluate for pancreatitis. History of pancreas cancer. Now with ongoing abdominal pain. Progressive biliary dilatation. EXAM: MRI ABDOMEN WITHOUT AND WITH CONTRAST (INCLUDING MRCP) TECHNIQUE: Multiplanar multisequence MR imaging of the abdomen was performed both before and after the administration of intravenous contrast. Heavily T2-weighted images of the biliary and pancreatic ducts were obtained, and three-dimensional MRCP images were rendered by post processing. CONTRAST:  50mL MULTIHANCE GADOBENATE DIMEGLUMINE 529 MG/ML IV SOLN COMPARISON:  12/02/2017 FINDINGS: Lower chest: No acute findings. Hepatobiliary: Diffusely heterogeneous enhancement pattern of the liver is identified. There is a focal area of increased T2 signal within the region of the caudate lobe of liver  which measures 2.4 by 2.5 cm, image 23/3. This demonstrates relative increased enhancement compared with the remainder of the liver, image 48/1301. Within the remaining portions of the liver there is heterogeneous attenuation and enhancement. There is marked intrahepatic bile duct dilatation. This  demonstrates significant progression when compared with 11/18/17. Pancreas: Residual pancreas is largely atrophic with dilatation of the main duct, image 59/1303. The main duct measures up to 7 mm. Spleen:  Within normal limits in size and appearance. Adrenals/Urinary Tract: The adrenal glands are normal. Unremarkable appearance of the kidneys. Stomach/Bowel: The stomach and the remaining small bowel loops are unremarkable. Dilatation of the hepaticojejunostomy loop is identified. There appears to be marked narrowing of the hepaticojejunostomy loop at the point at which the crosses the midline (from right to left) between the aorta and SMV, image 72/1303. Findings may reflect underlying postsurgical scarring. Recurrent tumor not excluded. Vascular/Lymphatic:  Normal appearance of the abdominal aorta. Other:  No ascites or focal fluid collections identified. Musculoskeletal: No suspicious bone lesions identified. IMPRESSION: 1. Postoperative changes from Whipple procedure again noted. On today's study there is marked and progressive dilatation of the hepaticojejunostomy loop, bile ducts and main pancreatic duct. Focal area of narrowing of the hepaticojejunostomy loop as it crosses the midline from right to left. Findings may represent underlying postsurgical scarring and stricture formation versus recurrent tumor. 2. There is a focal area of abnormal signal in the region of the caudate lobe of liver with increased enhancement. It is unclear whether not this is abnormal signal and enhancement within the liver parenchyma or adenopathy. A PET-CT may be helpful to assess for recurrent metabolically active tumor. Electronically Signed   By: Kerby Moors M.D.   On: 12/03/2017 08:37   Mr Abdomen Mrcp Moise Boring Contast  Result Date: 12/03/2017 CLINICAL DATA:  Evaluate for pancreatitis. History of pancreas cancer. Now with ongoing abdominal pain. Progressive biliary dilatation. EXAM: MRI ABDOMEN WITHOUT AND WITH CONTRAST  (INCLUDING MRCP) TECHNIQUE: Multiplanar multisequence MR imaging of the abdomen was performed both before and after the administration of intravenous contrast. Heavily T2-weighted images of the biliary and pancreatic ducts were obtained, and three-dimensional MRCP images were rendered by post processing. CONTRAST:  34mL MULTIHANCE GADOBENATE DIMEGLUMINE 529 MG/ML IV SOLN COMPARISON:  12/02/2017 FINDINGS: Lower chest: No acute findings. Hepatobiliary: Diffusely heterogeneous enhancement pattern of the liver is identified. There is a focal area of increased T2 signal within the region of the caudate lobe of liver which measures 2.4 by 2.5 cm, image 23/3. This demonstrates relative increased enhancement compared with the remainder of the liver, image 48/1301. Within the remaining portions of the liver there is heterogeneous attenuation and enhancement. There is marked intrahepatic bile duct dilatation. This demonstrates significant progression when compared with 11/18/17. Pancreas: Residual pancreas is largely atrophic with dilatation of the main duct, image 59/1303. The main duct measures up to 7 mm. Spleen:  Within normal limits in size and appearance. Adrenals/Urinary Tract: The adrenal glands are normal. Unremarkable appearance of the kidneys. Stomach/Bowel: The stomach and the remaining small bowel loops are unremarkable. Dilatation of the hepaticojejunostomy loop is identified. There appears to be marked narrowing of the hepaticojejunostomy loop at the point at which the crosses the midline (from right to left) between the aorta and SMV, image 72/1303. Findings may reflect underlying postsurgical scarring. Recurrent tumor not excluded. Vascular/Lymphatic:  Normal appearance of the abdominal aorta. Other:  No ascites or focal fluid collections identified. Musculoskeletal: No suspicious bone lesions identified. IMPRESSION: 1. Postoperative changes  from Whipple procedure again noted. On today's study there is marked  and progressive dilatation of the hepaticojejunostomy loop, bile ducts and main pancreatic duct. Focal area of narrowing of the hepaticojejunostomy loop as it crosses the midline from right to left. Findings may represent underlying postsurgical scarring and stricture formation versus recurrent tumor. 2. There is a focal area of abnormal signal in the region of the caudate lobe of liver with increased enhancement. It is unclear whether not this is abnormal signal and enhancement within the liver parenchyma or adenopathy. A PET-CT may be helpful to assess for recurrent metabolically active tumor. Electronically Signed   By: Kerby Moors M.D.   On: 12/03/2017 08:37   Ir Percutaneous Transhepatic Cholangiogram  Result Date: 12/05/2017 INDICATION: Pancreatic carcinoma status post Whipple, now with epigastric pain and dilatation of the hepaticojejunostomy loop and bile ducts. Elevated bilirubin. Decompression is requested. EXAM: PERCUTANEOUS TRANSHEPATIC CHOLANGIOGRAM PERCUTANEOUS INTERNAL/EXTERNAL BILIARY DRAIN MEDICATIONS: Patient was already receiving adequate prophylactic antibiotic coverage. ANESTHESIA/SEDATION: Intravenous Fentanyl and Versed were administered as conscious sedation during continuous monitoring of the patient's level of consciousness and physiological / cardiorespiratory status by the radiology RN, with a total moderate sedation time of 23 minutes. PROCEDURE: Informed written consent was obtained from the patient after a thorough discussion of the procedural risks, benefits and alternatives. All questions were addressed. Maximal Sterile Barrier Technique was utilized including caps, mask, sterile gowns, sterile gloves, sterile drape, hand hygiene and skin antiseptic. A timeout was performed prior to the initiation of the procedure. Limited ultrasound confirmed intrahepatic biliary ductal dilatation in the left lobe. An appropriate skin entry site was determined. Under real-time ultrasound  guidance, a 21 gauge needle advanced into a dilated peripheral left lateral segment duct. Bowel spontaneously returned through the needle hub. A 018 guidewire advanced centrally easily. A 6 French transitional dilator was advanced over the wire. Contrast injection for percutaneous transhepatic cholangiogram was performed. This confirmed placement within the central intrahepatic biliary tree. There is intra and extrahepatic biliary ductal dilatation. Contrast did flow across the anastomosis at the hepaticojejunostomy, with dilatation of the bowel. The dilator was exchanged over a Bentson wire for a 5 [place] Kumpe catheter, advanced into the jejunal loop. Catheter exchanged over an Amplatz wire for vascular dilator which facilitated advancement of a 10 [place] internal external biliary drain catheter, placed with sideholes spanning the intrahepatic biliary tree, extending into the anastomosed jejunal loop. Contrast injection confirmed good position and patency. The catheter was secured externally with 0 Prolene suture and StatLock and placed to external drainage. The patient tolerated the procedure well. FLUOROSCOPY TIME:  2 minutes 54 seconds; 47 mGy COMPLICATIONS: None immediate IMPRESSION: 1. Percutaneous transhepatic cholangiogram demonstrates intra and extrahepatic biliary ductal dilatation with patency of the hepaticojejunostomy anastomosis. 2. Technically successful internal/external biliary drain catheter placement as above. PLAN: Allow biliary decompression with internal /external drainage. Follow bilirubin. We can do a follow-up cholangiogram next week to assess the level of obstruction, possible brush biopsy. Electronically Signed   By: Lucrezia Europe M.D.   On: 12/05/2017 17:36   US Abdomen Limited Ruq  Result Date: 12/02/2017 CLINICAL DATA:  Abdominal pain.  History of pancreatic carcinoma EXAM: ULTRASOUND ABDOMEN LIMITED RIGHT UPPER QUADRANT COMPARISON:  CT abdomen and pelvis November 18, 2017 FINDINGS:  Gallbladder: Surgically absent. Common bile duct: Diameter: 11 mm, prominent. No biliary duct mass or calculus is appreciable by ultrasound. Liver: No focal lesion identified. Liver echogenicity overall is increased. There is intrahepatic biliary duct dilatation. Portal vein is  patent on color Doppler imaging with normal direction of blood flow towards the liver. IMPRESSION: 1. Gallbladder absent. There is biliary duct dilatation, similar to recent CT. No biliary duct mass or calculus evident. 2. Diffuse increase in liver echogenicity, a finding felt to be indicative of hepatic steatosis. While no focal liver lesions are evident, it must be cautioned that the sensitivity of ultrasound for detection of focal liver lesions is diminished significantly in this circumstance. Electronically Signed   By: Lowella Grip III M.D.   On: 12/02/2017 14:19    Labs:  CBC: Recent Labs    12/03/17 0515 12/04/17 0353 12/05/17 0536 12/06/17 0334  WBC 7.8 5.7 5.9 6.4  HGB 8.6* 8.4* 8.8* 9.2*  HCT 25.3* 24.5* 26.2* 27.4*  PLT 235 282 272 335    COAGS: Recent Labs    12/02/17 1928 12/04/17 0353  INR 1.70 1.47  APTT 41*  --     BMP: Recent Labs    12/03/17 0515 12/04/17 0353 12/05/17 0536 12/06/17 0334  NA 139 139 136 137  K 3.4* 3.5 3.8 3.5  CL 108 107 102 101  CO2 24 24 22 22   GLUCOSE 104* 69 58* 82  BUN 6 6 7 7   CALCIUM 8.1* 8.3* 8.4* 8.4*  CREATININE 0.42* 0.52 0.54 0.55  GFRNONAA >60 >60 >60 >60  GFRAA >60 >60 >60 >60    LIVER FUNCTION TESTS: Recent Labs    12/03/17 0515 12/04/17 0353 12/05/17 0536 12/06/17 0334  BILITOT 4.6* 2.7* 3.6* 2.9*  AST 120* 102* 104* 80*  ALT 57* 50 51 46  ALKPHOS 566* 564* 585* 609*  PROT 5.8* 5.7* 6.1* 6.2*  ALBUMIN 2.4* 2.5* 2.6* 2.6*    Assessment and Plan:  Panc Ca I/E Bili drain placed 3/15 Doing well; less pain OP great Will follow  Electronically Signed: Chiniqua Kilcrease A, PA-C 12/06/2017, 1:16 PM   I spent a total of 15  Minutes at the the patient's bedside AND on the patient's hospital floor or unit, greater than 50% of which was counseling/coordinating care for Biliary drain placement

## 2017-12-06 NOTE — Progress Notes (Signed)
PROGRESS NOTE    SALONI LABLANC  VUY:[id number] DOB: [birth year] DOA: 12/02/2017 PCP: Chesley Noon, MD   Brief Narrative:  HPI [name]:  [name] is a 72 y.o. 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  Presented with abdominal pain ongoing for 2-3 weeks. It was 7 out of 10 in intensity.  Sharp pain in the upper abdomen without any radiation.  No precipitating factors identified.  No aggravating or relieving factors.  She was seen at the cancer center.  It appears that she is been having the symptoms since the end of February.  At that time oncology was consider referring the patient to gastroenterology.  Patient was diagnosed with sinusitis and was placed on oral antibiotics but she developed diarrhea.  She saw her primary care physician yesterday and took a stool sample which was sent for C. difficile. Those results are not available yet.  But she stopped taking her antibiotics and her diarrhea has resolved.  Her abdominal pain however continues.  She has had some nausea but no vomiting.  Denies any facial pain.  Denies skin rashes shortness of breath cough.  Denies any joint pains.  Some pressure with urinating but denies any burning sensation.  Evaluation in the emergency department reveals that patient is febrile.  Patient was noted to have borderline low blood pressures.  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  Patient will need hospitalization for further management.  There is concern for sepsis.  *Patient under went EGD and PTC/External Drain afterwards and pain is improved. She has copious drainage from Biliary Drain.   Assessment & Plan:   Principal Problem:  Fever Active Problems:   Abdominal pain   Hyponatremia   Adenocarcinoma of head of pancreas (HCC)   Chronic anticoagulation   Anemia   Chronic atrial fibrillation (HCC)   Hypotension   Sepsis (HCC)   Afferent loop syndrome   Abnormal CT of the abdomen  Sepsis likely from Cholangitis with GNR Bacteremia, improving  -Etiology unclear but possibly GI/biliary in origin.   -Blood Cx Showed GNR in Anaerobic Bottle Only but BCID showed none detected; Await Final Result as nothing has grown  -Considering her abnormal LFTs and findings and CT scan cholangitis and obstructive duct needs to be considered.  Follow-up on culture data.   -D/C'd Vancomycin given GNR and will continue for now Zosyn for now.   -Lactic acid level is reassuringly normal and went from 1.41 -> 1.0 -Pro-calcitonin was 4.09. -Patient's Fever has improved and she has no White Count and WBC is 6.4 -IVF Rehydration with NS at 125 mL/hr now stopped  -Sepsis physiology improving  -Repeat CBC in AM   Abdominal Pain, improved  -CT scan showed Mildly progressive intrahepatic and extrahepatic ductal dilatation, as described above. Associated dilatation of the afferent limb. 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  -General Surgery has been consulted and recommended GI consult to eval the Afferent Limb and IR to see if they can decompress the biliary system with transhepatic drain -Gastroenterology consulted and MRCP done.See below.  -IR Consulted and Percutaneous Transhepatic Cholangiogram and Biliary Drain Placement after EGD; Was successful and patient's pain is improved and Drain is copious  Abnormal liver function tests including elevated alkaline phosphatase, bilirubin and transaminases -AST went from 114 -> 120 -> 102 -> 104 -> 80 -ALT went from 60 -> 57 -> 50 -> 51 -> 46 -As discussed above.  -MRCP showed Postoperative changes from Whipple procedure again  noted. On today's study there is marked and progressive dilatation of the hepaticojejunostomy loop, bile ducts and main pancreatic duct. Focal area of narrowing of the hepaticojejunostomy loop as it crosses the midline from right to left. Findings may represent underlying postsurgical scarring and stricture formation versus recurrent Tumor. There was also a focal area of abnormal signal in the region of the caudate lobe of liver with increased enhancement. It is unclear whether not this is abnormal signal and enhancement within the liver parenchyma or adenopathy -Gastroenterology Consulted for further evaluation and treatment and recommending C/w IV Zosyn as IV Vaoncomycin D/C'd;  -GI did EGD yesterday and angulation at the entrance of the afferent limb was not permitting the passage of the endoscope despite effort and GI could not r/o Afferent Loop Syndrome.  -IR consulted and underwent Percutaneous Drainage of the Bile Duct yesterday ; Last dose of Xarelto 12/01/17.   Hypotension -She is asymptomatic and BP is improving and BP was 150/81 this AM  -D/C'd IVF Rehydration .  Hyponatremia -Improved. Was likely from Hypovolemia -Patient's Na+ went from 129 -> 139 -> 136 -> 137  -IVF now D/C"d  -Continue to Monitor  -Repeat CMP in AM   Normocytic Anemia -Likely Hemo-concentrated on Admission and likley dilutional Drop now  -Likely Anemia of chronic disease.  No overt blood loss noted. -Hb/Hct went from 10.6/31.6 -> 8.6/25.3 -> 8.4/24.5 -> 8.8/26.2 -> 9.2/27.4 -Continue to Monitor for S/Sx of Bleeding as she was previously Anticoagulated with Xarelto  -Repeat CBC in AM   Recent Diarrhea, improved  -Probably due to antibiotics.   -She took a stool sample to her PCP prior to Admission. Those results are not available to [place] yet.  But her diarrhea has resolved.   We will continue to monitor for no and doubt it is C Difficile given no WBC.Marland Kitchen  History of Pancreatic Cancer -Followed by Oncology  Dr. Julieanne Manson.  -Patient's CA 19-9 starting to elevate and went from 24 -> 316 -MRCP showed Postoperative changes from Whipple procedure again noted. On today's study there is marked and progressive dilatation of the hepaticojejunostomy loop, bile ducts and main pancreatic duct. Focal area of narrowing of the hepaticojejunostomy loop as it crosses the midline from right to left. Findings may represent underlying postsurgical scarring and stricture formation versus recurrent tumor. -Not on Chemotherapy currently.   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  -Pain control as before.  History of Paroxysmal Atrial Fibrillation -She is on Flecainide which will be continued at 100 mg po BID.  -Will resume her Home Metoprolol XL 12.5 mg po Daily  -She is on Xarelto which will be held; Will resume likely in AM  Hypokalemia -Patient's K+ was 3.5 this AM -Continue to Monitor and Replete as Necessary -Repeat CMP in AM   Hyperbilirubinemia -Improving. T Bili went from 4.6 -> 2.7 -> 3.6 -> 2.9 -As above -Repeat CMP in AM   DVT prophylaxis: SCDs  Code Status: DO NOT RESUSCITATE  Family Communication: Discussed with Husband at bedside  Disposition Plan: Remain Inpatient for continued work up and treatment   Consultants:   General Surgery  Gastroenterology  Oncology   IR  Procedures:  Pertcutaneous Transhepatic Cholangiogram with Biliary  Drain  1. Percutaneous transhepatic cholangiogram demonstrates intra and extrahepatic biliary ductal dilatation with patency of the hepaticojejunostomy anastomosis. 2. Technically successful internal/external biliary drain catheter placement as above.  EGD Findings:      The esophagus was normal. The stomach revealed evidence of prior       surgery. The efferent limb was normal was deeply intubated. The afferent       limb was acutely angulated and could not be intubated despite multiple       maneuvers with both standard  upper endoscope and ultrathin colonoscope       over the course of 45 minutes. Impression:               1. Status post Whipple                           2. Angulation at the entrance of the afferent limb                            not permitting passage of the endoscope's despite                            extensive effort as described. Cannot rule out                            afferent loop syndrome     Antimicrobials:  Anti-infectives (From admission, onward)   Start     Dose/Rate Route Frequency Ordered Stop   12/02/17 2200  vancomycin (VANCOCIN) 500 mg in sodium chloride 0.9 % 100 mL IVPB  Status:  Discontinued     500 mg 100 mL/hr over 60 Minutes Intravenous Every 12 hours 12/02/17 1726 12/03/17 1857   12/02/17 2100  piperacillin-tazobactam (ZOSYN) IVPB 3.375 g     3.375 g 12.5 mL/hr over 240 Minutes Intravenous Every 8 hours 12/02/17 1724     12/02/17 1330  piperacillin-tazobactam (ZOSYN) IVPB 3.375 g  Status:  Discontinued     3.375 g 100 mL/hr over 30 Minutes Intravenous  Once 12/02/17 1323 12/02/17 1326   12/02/17 1330  vancomycin (VANCOCIN) IVPB 1000 mg/200 mL premix     1,000 mg 200 mL/hr over 60 Minutes Intravenous  Once 12/02/17 1323 12/02/17 1445     Subjective: Seen and examined and stated she pain was extremely better. No nausea or vomiting. No lightheadedness or dizziness.   Objective: Vitals:   12/05/17 1651 12/05/17 1714 12/05/17 2224 12/06/17 0628  BP: (!) 149/78 (!) 150/81 125/63 (!) 100/50  Pulse: 65 63 (!) 56 (!) 55  Resp: (!) '23 19 18 18  ' Temp:  97.7 F (36.5 C) 98.8 F (37.1 C) 98.5 F (36.9 C)  TempSrc:  Oral Oral Oral  SpO2: 98% 99% 98% 96%  Weight:      Height:        Intake/Output Summary (Last 24 hours) at 12/06/2017 4888 Last data filed at 12/06/2017 0548 Gross per 24 hour  Intake 1150 ml  Output 650 ml  Net 500 ml   Filed Weights   12/05/17 1259  Weight: 69.9 kg (154 lb)   Examination: Physical Exam:  Constitutional: WN/WD  Caucasian female in NAD Eyes: Sclerae anicteric. Lids normal ENMT: External Ears and nose appear normal Neck: Supple with no JVD Respiratory: Diminished to auscultation; No appreciable wheezing/rales/rhonchi; Unlabored breathing  Cardiovascular: RRR; No appreciable m/r/g.  Abdomen: Soft, NT, ND. Bowel sounds present; Biliary Drain in place with copious drainage  GU: Deferred Musculoskeletal: No contractures; No cyanosis  Skin: Warm and Dry; No appreciable rashes or lesions on a limited skin evaluation  Neurologic: CN 2-12 grossly intact. No appreciable focal deficits Psychiatric: Pleasant mood and affect. Intact judgement and insight  Data Reviewed: I have personally reviewed following labs and imaging studies  CBC: Recent Labs  Lab 12/02/17 1006 12/03/17 0515 12/04/17 0353 12/05/17 0536 12/06/17 0334  WBC 9.9 7.8 5.7 5.9 6.4  NEUTROABS 8.7*  --  3.5 4.0 4.5  HGB  --  8.6* 8.4* 8.8* 9.2*  HCT 31.6* 25.3* 24.5* 26.2* 27.4*  MCV 88.0 86.9 88.8 87.9 88.4  PLT 241 235 282 272 921   Basic Metabolic Panel: Recent Labs  Lab 12/02/17 1006 12/03/17 0515 12/04/17 0353 12/05/17 0536 12/06/17 0334  NA 129* 139 139 136 137  K 4.0 3.4* 3.5 3.8 3.5  CL 96* 108 107 102 101  CO2 '23 24 24 22 22  ' GLUCOSE 132 104* 69 58* 82  BUN '8 6 6 7 7  ' CREATININE 0.76 0.42* 0.52 0.54 0.55  CALCIUM 9.1 8.1* 8.3* 8.4* 8.4*  MG  --  2.0 2.1 2.1 2.0  PHOS  --  3.0 2.7 3.4 3.3   GFR: Estimated Creatinine Clearance: 61.4 mL/min (by C-G formula based on SCr of 0.55 mg/dL). Liver Function Tests: Recent Labs  Lab 12/02/17 1006 12/03/17 0515 12/04/17 0353 12/05/17 0536 12/06/17 0334  AST 114* 120* 102* 104* 80*  ALT 60* 57* 50 51 46  ALKPHOS 831* 566* 564* 585* 609*  BILITOT 3.4* 4.6* 2.7* 3.6* 2.9*  PROT 7.1 5.8* 5.7* 6.1* 6.2*  ALBUMIN 2.9* 2.4* 2.5* 2.6* 2.6*   Recent Labs  Lab 12/02/17 1325  LIPASE 19   No results for input(s): AMMONIA in the last 168 hours. Coagulation  Profile: Recent Labs  Lab 12/02/17 1928 12/04/17 0353  INR 1.70 1.47   Cardiac Enzymes: No results for input(s): CKTOTAL, CKMB, CKMBINDEX, TROPONINI in the last 168 hours. BNP (last 3 results) No results for input(s): PROBNP in the last 8760 hours. HbA1C: No results for input(s): HGBA1C in the last 72 hours. CBG: No results for input(s): GLUCAP in the last 168 hours. Lipid Profile: No results for input(s): CHOL, HDL, LDLCALC, TRIG, CHOLHDL, LDLDIRECT in the last 72 hours. Thyroid Function Tests: No results for input(s): TSH, T4TOTAL, FREET4, T3FREE, THYROIDAB in the last 72 hours. Anemia Panel: No results for input(s): VITAMINB12, FOLATE, FERRITIN, TIBC, IRON, RETICCTPCT in the last 72 hours. Sepsis Labs: Recent Labs  Lab 12/02/17 1344 12/02/17 1928 12/02/17 2323  PROCALCITON  --  4.09  --   LATICACIDVEN 1.41 1.0 1.0    Recent Results (from the past 240 hour(s))  Urine Culture     Status: None   Collection Time: 12/02/17 10:04 AM  Result Value Ref Range Status   Specimen Description   Final    URINE, CLEAN CATCH Performed at [facility], [place] [address] [place], [place] [address]    Special Requests   Final    NONE Performed at [facility], [place] [address], [place], [place] [address]    Culture   Final    NO GROWTH Performed at [facility], [place] [address] [place],  [address]    Report Status 12/03/2017 FINAL  Final  Culture, Blood     Status:  Abnormal (Preliminary result)   Collection Time: 12/02/17 11:39 AM  Result Value Ref Range Status   Specimen Description BLOOD RIGHT ARM  Final   Special Requests   Final    BOTTLES DRAWN AEROBIC AND ANAEROBIC Blood Culture results may not be optimal due to an excessive volume of blood received in culture bottles   Culture  Setup Time   Final    GRAM NEGATIVE RODS ANAEROBIC BOTTLE ONLY CRITICAL RESULT CALLED TO, READ BACK BY AND VERIFIED WITH: C SHADE  PHARMD 1801 12/03/17 A BROWNING    Culture (A)  Final    ANAEROBIC GRAM NEGATIVE ROD IDENTIFICATION TO FOLLOW BETA LACTAMASE POSITIVE Performed at [facility], [place] [address], [place], [place] [address]    Report Status PENDING  Incomplete  Blood Culture ID Panel (Reflexed)     Status: None   Collection Time: 12/02/17 11:39 AM  Result Value Ref Range Status   Enterococcus species NOT DETECTED NOT DETECTED Final   Listeria monocytogenes NOT DETECTED NOT DETECTED Final   Staphylococcus species NOT DETECTED NOT DETECTED Final   Staphylococcus aureus NOT DETECTED NOT DETECTED Final   Streptococcus species NOT DETECTED NOT DETECTED Final   Streptococcus agalactiae NOT DETECTED NOT DETECTED Final   Streptococcus pneumoniae NOT DETECTED NOT DETECTED Final   Streptococcus pyogenes NOT DETECTED NOT DETECTED Final   Acinetobacter baumannii NOT DETECTED NOT DETECTED Final   Enterobacteriaceae species NOT DETECTED NOT DETECTED Final   Enterobacter cloacae complex NOT DETECTED NOT DETECTED Final   Escherichia coli NOT DETECTED NOT DETECTED Final   Klebsiella oxytoca NOT DETECTED NOT DETECTED Final   Klebsiella pneumoniae NOT DETECTED NOT DETECTED Final   Proteus species NOT DETECTED NOT DETECTED Final   Serratia marcescens NOT DETECTED NOT DETECTED Final   Haemophilus influenzae NOT DETECTED NOT DETECTED Final   Neisseria meningitidis NOT DETECTED NOT DETECTED Final   Pseudomonas aeruginosa NOT DETECTED NOT DETECTED Final   Candida albicans NOT DETECTED NOT DETECTED Final   Candida glabrata NOT DETECTED NOT DETECTED Final   Candida krusei NOT DETECTED NOT DETECTED Final   Candida parapsilosis NOT DETECTED NOT DETECTED Final   Candida tropicalis NOT DETECTED NOT DETECTED Final    Comment: Performed at [facility], [place] [address], [place], [place] [address]  Culture, Blood     Status: None (Preliminary result)   Collection Time: 12/02/17 11:50 AM  Result Value Ref Range Status    Specimen Description BLOOD PORTA CATH  Final   Special Requests   Final    BOTTLES DRAWN AEROBIC AND ANAEROBIC Blood Culture adequate volume   Culture   Final    NO GROWTH 3 DAYS Performed at [facility], [address] [place], [place] [address]    Report Status PENDING  Incomplete  Blood Culture (routine x 2)     Status: None (Preliminary result)   Collection Time: 12/02/17  1:27 PM  Result Value Ref Range Status   Specimen Description   Final    BLOOD LEFT PORTA CATH Performed at [place] [address], [place], [place] [address]    Special Requests   Final    BOTTLES DRAWN AEROBIC AND ANAEROBIC Blood Culture adequate volume Performed at [place] [address] [place], [place] [address]    Culture   Final    NO GROWTH 3 DAYS Performed at [facility], [place] [address] [place], [place] [address]    Report  Status PENDING  Incomplete  Respiratory Panel by PCR     Status: None   Collection Time: 12/02/17  3:40 PM  Result Value Ref Range Status   Adenovirus NOT DETECTED NOT DETECTED Final   Coronavirus 229E NOT DETECTED NOT DETECTED Final   Coronavirus HKU1 NOT DETECTED NOT DETECTED Final   Coronavirus NL63 NOT DETECTED NOT DETECTED Final   Coronavirus OC43 NOT DETECTED NOT DETECTED Final   Metapneumovirus NOT DETECTED NOT DETECTED Final   Rhinovirus / Enterovirus NOT DETECTED NOT DETECTED Final   Influenza A NOT DETECTED NOT DETECTED Final   Influenza B NOT DETECTED NOT DETECTED Final   Parainfluenza Virus 1 NOT DETECTED NOT DETECTED Final   Parainfluenza Virus 2 NOT DETECTED NOT DETECTED Final   Parainfluenza Virus 3 NOT DETECTED NOT DETECTED Final   Parainfluenza Virus 4 NOT DETECTED NOT DETECTED Final   Respiratory Syncytial Virus NOT DETECTED NOT DETECTED Final   Bordetella pertussis NOT DETECTED NOT DETECTED Final   Chlamydophila pneumoniae NOT DETECTED NOT DETECTED Final   Mycoplasma pneumoniae NOT  DETECTED NOT DETECTED Final    Comment: Performed at [facility], [place] [address] [place], [place] [address]    Radiology Studies: Ir Percutaneous Transhepatic Cholangiogram  Result Date: 12/05/2017 INDICATION: Pancreatic carcinoma status post Whipple, now with epigastric pain and dilatation of the hepaticojejunostomy loop and bile ducts. Elevated bilirubin. Decompression is requested. EXAM: PERCUTANEOUS TRANSHEPATIC CHOLANGIOGRAM PERCUTANEOUS INTERNAL/EXTERNAL BILIARY DRAIN MEDICATIONS: Patient was already receiving adequate prophylactic antibiotic coverage. ANESTHESIA/SEDATION: Intravenous Fentanyl and Versed were administered as conscious sedation during continuous monitoring of the patient's level of consciousness and physiological / cardiorespiratory status by the radiology RN, with a total moderate sedation time of 23 minutes. PROCEDURE: Informed written consent was obtained from the patient after a thorough discussion of the procedural risks, benefits and alternatives. All questions were addressed. Maximal Sterile Barrier Technique was utilized including caps, mask, sterile gowns, sterile gloves, sterile drape, hand hygiene and skin antiseptic. A timeout was performed prior to the initiation of the procedure. Limited ultrasound confirmed intrahepatic biliary ductal dilatation in the left lobe. An appropriate skin entry site was determined. Under real-time ultrasound guidance, a 21 gauge needle advanced into a dilated peripheral left lateral segment duct. Bowel spontaneously returned through the needle hub. A 018 guidewire advanced centrally easily. A 6 French transitional dilator was advanced over the wire. Contrast injection for percutaneous transhepatic cholangiogram was performed. This confirmed placement within the central intrahepatic biliary tree. There is intra and extrahepatic biliary ductal dilatation. Contrast did flow across the anastomosis at the hepaticojejunostomy, with dilatation  of the bowel. The dilator was exchanged over a Bentson wire for a 5 [place] Kumpe catheter, advanced into the jejunal loop. Catheter exchanged over an Amplatz wire for vascular dilator which facilitated advancement of a 10 [place] internal external biliary drain catheter, placed with sideholes spanning the intrahepatic biliary tree, extending into the anastomosed jejunal loop. Contrast injection confirmed good position and patency. The catheter was secured externally with 0 Prolene suture and StatLock and placed to external drainage. The patient tolerated the procedure well. FLUOROSCOPY TIME:  2 minutes 54 seconds; 47 mGy COMPLICATIONS: None immediate IMPRESSION: 1. Percutaneous transhepatic cholangiogram demonstrates intra and extrahepatic biliary ductal dilatation with patency of the hepaticojejunostomy anastomosis. 2. Technically successful internal/external biliary drain catheter placement as above. PLAN: Allow biliary decompression with internal /external drainage. Follow bilirubin. We can do a follow-up cholangiogram next week to assess the level of obstruction, possible brush biopsy. Electronically Signed  By: Lucrezia Europe M.D.   On: 12/05/2017 17:36   Scheduled Meds: . amitriptyline  50 mg Oral QHS  . flecainide  100 mg Oral BID  . fluticasone  2 spray Each Nare Daily  . metoprolol succinate  12.5 mg Oral Daily  . sodium chloride flush  5 mL Intracatheter Q8H   Continuous Infusions: . piperacillin-tazobactam (ZOSYN)  IV 3.375 g (12/06/17 0548)    LOS: 4 days   Kerney Elbe, DO [facility] Pager [phone]  If 7PM-7AM, please contact night-coverage [email or web address] Password [email or web address] 12/06/2017, 8:21 AM

## 2017-12-07 LAB — PHOSPHORUS: PHOSPHORUS: 3.3 mg/dL (ref 2.5–4.6)

## 2017-12-07 LAB — COMPREHENSIVE METABOLIC PANEL
ALBUMIN: 2.8 g/dL — AB (ref 3.5–5.0)
ALT: 42 U/L (ref 14–54)
ANION GAP: 10 (ref 5–15)
AST: 75 U/L — ABNORMAL HIGH (ref 15–41)
Alkaline Phosphatase: 524 U/L — ABNORMAL HIGH (ref 38–126)
BUN: <5 mg/dL — ABNORMAL LOW (ref 6–20)
CHLORIDE: 99 mmol/L — AB (ref 101–111)
CO2: 29 mmol/L (ref 22–32)
CREATININE: 0.62 mg/dL (ref 0.44–1.00)
Calcium: 8.6 mg/dL — ABNORMAL LOW (ref 8.9–10.3)
GFR calc non Af Amer: >60 mL/min (ref >60)
Glucose, Bld: 107 mg/dL — ABNORMAL HIGH (ref 65–99)
Potassium: 3.2 mmol/L — ABNORMAL LOW (ref 3.5–5.1)
SODIUM: 138 mmol/L (ref 135–145)
Total Bilirubin: 2.2 mg/dL — ABNORMAL HIGH (ref 0.3–1.2)
Total Protein: 6.3 g/dL — ABNORMAL LOW (ref 6.5–8.1)

## 2017-12-07 LAB — CULTURE, BLOOD (SINGLE)
Culture: NO GROWTH
SPECIAL REQUESTS: ADEQUATE

## 2017-12-07 LAB — CBC WITH DIFFERENTIAL/PLATELET
BASOS PCT: 0 %
Basophils Absolute: 0 10*3/uL (ref 0.0–0.1)
EOS ABS: 0.3 10*3/uL (ref 0.0–0.7)
Eosinophils Relative: 6 %
HCT: 27.3 % — ABNORMAL LOW (ref 36.0–46.0)
HEMOGLOBIN: 9.2 g/dL — AB (ref 12.0–15.0)
LYMPHS ABS: 1.8 10*3/uL (ref 0.7–4.0)
Lymphocytes Relative: 32 %
MCH: 29.8 pg (ref 26.0–34.0)
MCHC: 33.7 g/dL (ref 30.0–36.0)
MCV: 88.3 fL (ref 78.0–100.0)
Monocytes Absolute: 0.6 10*3/uL (ref 0.1–1.0)
Monocytes Relative: 10 %
NEUTROS PCT: 52 %
Neutro Abs: 2.8 10*3/uL (ref 1.7–7.7)
Platelets: 359 10*3/uL (ref 150–400)
RBC: 3.09 MIL/uL — AB (ref 3.87–5.11)
RDW: 14.8 % (ref 11.5–15.5)
WBC: 5.5 10*3/uL (ref 4.0–10.5)

## 2017-12-07 LAB — MAGNESIUM: Magnesium: 2 mg/dL (ref 1.7–2.4)

## 2017-12-07 LAB — CULTURE, BLOOD (ROUTINE X 2)
CULTURE: NO GROWTH
Special Requests: ADEQUATE

## 2017-12-07 MED ORDER — POTASSIUM CHLORIDE CRYS ER 20 MEQ PO TBCR
40.0000 meq | EXTENDED_RELEASE_TABLET | Freq: Two times a day (BID) | ORAL | Status: DC
  Administered 2017-12-07 – 2017-12-09 (×5): 40 meq via ORAL
  Filled 2017-12-07 (×5): qty 2

## 2017-12-07 NOTE — Progress Notes (Signed)
PROGRESS NOTE    [name]  QBV:[id number] DOB: 04/16/1946 DOA: 12/02/2017 PCP: Chesley Noon, MD   Brief Narrative:  HPI [name]:  [name] is a 72 y.o. 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  Presented with abdominal pain ongoing for 2-3 weeks. It was 7 out of 10 in intensity.  Sharp pain in the upper abdomen without any radiation.  No precipitating factors identified.  No aggravating or relieving factors.  She was seen at the cancer center.  It appears that she is been having the symptoms since the end of February.  At that time oncology was consider referring the patient to gastroenterology.  Patient was diagnosed with sinusitis and was placed on oral antibiotics but she developed diarrhea.  She saw her primary care physician yesterday and took a stool sample which was sent for C. difficile. Those results are not available yet.  But she stopped taking her antibiotics and her diarrhea has resolved.  Her abdominal pain however continues.  She has had some nausea but no vomiting.  Denies any facial pain.  Denies skin rashes shortness of breath cough.  Denies any joint pains.  Some pressure with urinating but denies any burning sensation.  Evaluation in the emergency department reveals that patient is febrile.  Patient was noted to have borderline low blood pressures.  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  Patient will need hospitalization for further management.  There is concern for sepsis.  *Patient under went EGD and PTC/External Drain afterwards and pain is improved. She has copious drainage from Biliary Drain yesterday and it is slowing down. General Surgery  following and to discuss with Dr. Barry Dienes about further steps in care as Bilirubin is improving. IR possibly planning Follow-up Cholangiogram next week to assess the level of obstruction and possible brush biopsy.   Assessment & Plan:   Principal Problem:   Fever Active Problems:   Abdominal pain   Hyponatremia   Adenocarcinoma of head of pancreas (HCC)   Chronic anticoagulation   Anemia   Chronic atrial fibrillation (HCC)   Hypotension   Sepsis (HCC)   Afferent loop syndrome   Abnormal CT of the abdomen  Sepsis likely from Cholangitis with GNR Bacteremia, improving  -Etiology unclear but likley GI/biliary in origin.   -Blood Cx Showed GNR in Anaerobic Bottle Only but BCID showed none detected; Result showing Bacteroides Fragilis which is Beta Lactamase Positive -Considering her abnormal LFTs and findings and CT scan cholangitis and obstructive duct needs to be considered.  Follow-up on culture data.   -D/C'd Vancomycin given GNR and will continue for now Zosyn for now.   -Lactic acid level is reassuringly normal and went from 1.41 -> 1.0 -Pro-calcitonin was 4.09. -Patient's has been afebrile and she has no White Count and WBC is 5.5 -IVF Rehydration with NS at 125 mL/hr now stopped  -Sepsis physiology improving  -Repeat CBC in AM   Abdominal Pain, improved  -CT scan showed Mildly progressive intrahepatic and extrahepatic ductal dilatation, as described above. Associated dilatation of the afferent limb. 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  -General Surgery has been consulted and recommended GI consult to eval the Afferent Limb  and IR to see if they can decompress the biliary system with transhepatic drain -Gastroenterology consulted and MRCP done.See below.  -IR Consulted and Percutaneous Transhepatic Cholangiogram and Biliary Drain Placement after EGD; Was successful and patient's pain is improved and Drain is copious     -General Surgery following and will Discuss with Dr. Barry Dienes about next steps of Care; In the interm continue to Hold Xarelto for any possible surgical procedure if any   Abnormal liver function tests including elevated alkaline phosphatase, bilirubin and transaminases -AST went from 114 -> 120 -> 102 -> 104 -> 80 -> 75 -ALT went from 60 -> 57 -> 50 -> 51 -> 46 -> 42 -As discussed above.  -MRCP showed Postoperative changes from Whipple procedure again noted. On today's study there is marked and progressive dilatation of the hepaticojejunostomy loop, bile ducts and main pancreatic duct. Focal area of narrowing of the hepaticojejunostomy loop as it crosses the midline from right to left. Findings may represent underlying postsurgical scarring and stricture formation versus recurrent Tumor. There was also a focal area of abnormal signal in the region of the caudate lobe of liver with increased enhancement. It is unclear whether not this is abnormal signal and enhancement within the liver parenchyma or adenopathy -Gastroenterology Consulted for further evaluation and treatment and recommending C/w IV Zosyn for now given Bacteroides Fragilis  -GI did EGD 12/05/17 and angulation at the entrance of the afferent limb was not permitting the passage of the endoscope despite effort and GI could not r/o Afferent Loop Syndrome.  -IR consulted and underwent Percutaneous Drainage of the Bile Duct 3/151/19; Last dose of Xarelto 12/01/17.  -General Surgery following and will continue to Hold Xarelto in case Surgical procedure is needed; Per Dr. Harlow Asa, c/w OOB and Ambulation and continue to Monitor Bile output; Dr. Barry Dienes to discuss about next steps in care   Hypotension -She is asymptomatic and BP is improving and BP was 150/81 this AM  -D/C'd IVF Rehydration .  Hyponatremia, improved -Improved. Was likely from Hypovolemia -Patient's Na+ went from 129 -> 138 -IVF now D/C"d  -Continue to Monitor  -Repeat CMP  in AM   Normocytic Anemia -Likely Hemo-concentrated on Admission and likley dilutional Drop now  -Likely Anemia of chronic disease.  No overt blood loss noted. -Hb/Hct now stable at 9.2/27.3 -Continue to Monitor for S/Sx of Bleeding as she was previously Anticoagulated with Xarelto  -Repeat CBC in AM   Recent Diarrhea, improved  -Probably due to antibiotics.   -She took a stool sample to her PCP prior to Admission. Those results are not available to [place] yet.  But her diarrhea has resolved.   We will continue to monitor for no and doubt it is C Difficile given no WBC.Marland Kitchen  History of Pancreatic Cancer -Followed by Oncology Dr. Julieanne Manson.  -Patient's CA 19-9 starting to elevate and went from 24 -> 316 -MRCP showed Postoperative changes from Whipple procedure again noted. On today's study there is marked and progressive dilatation of the hepaticojejunostomy loop, bile ducts and main pancreatic duct. Focal area of narrowing of the hepaticojejunostomy loop as it crosses the midline from right to left. Findings may represent underlying postsurgical scarring and stricture formation versus recurrent tumor. -Not on Chemotherapy currently.   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  -Pain control as before -General Surgery to Weigh in about next steps of care.  History of Paroxysmal Atrial Fibrillation -She is on Flecainide which will be continued at 100 mg po  BID.  -Will resume her Home Metoprolol XL 12.5 mg po Daily  -She is on Xarelto which was held; Continue to Hold given General Surgery's possibility for intervention   Hypokalemia -Patient's K+ was 3.2 this AM -Replete with po KCl 40 mEQ BID -Continue to Monitor and Replete as Necessary -Repeat CMP in AM   Hyperbilirubinemia -Improving. T Bili went from 4.6 -> 2.2 -As above -Repeat CMP in AM   DVT prophylaxis: SCDs  Code Status: DO NOT RESUSCITATE  Family Communication: Discussed with Husband at bedside    Disposition Plan: Remain Inpatient for continued work up and treatment   Consultants:   General Surgery  Gastroenterology  Oncology   IR  Procedures:  Pertcutaneous Transhepatic Cholangiogram with Biliary Drain  1. Percutaneous transhepatic cholangiogram demonstrates intra and extrahepatic biliary ductal dilatation with patency of the hepaticojejunostomy anastomosis. 2. Technically successful internal/external biliary drain catheter placement as above.  EGD Findings:      The esophagus was normal. The stomach revealed evidence of prior       surgery. The efferent limb was normal was deeply intubated. The afferent       limb was acutely angulated and could not be intubated despite multiple       maneuvers with both standard upper endoscope and ultrathin colonoscope       over the course of 45 minutes. Impression:               1. Status post Whipple                           2. Angulation at the entrance of the afferent limb                            not permitting passage of the endoscope's despite                            extensive effort as described. Cannot rule out                            afferent loop syndrome   Antimicrobials:  Anti-infectives (From admission, onward)   Start     Dose/Rate Route Frequency Ordered Stop   12/02/17 2200  vancomycin (VANCOCIN) 500 mg in sodium chloride 0.9 % 100 mL IVPB  Status:  Discontinued     500 mg 100 mL/hr over 60 Minutes Intravenous Every 12 hours 12/02/17 1726 12/03/17 1857   12/02/17 2100  piperacillin-tazobactam (ZOSYN) IVPB 3.375 g     3.375 g 12.5 mL/hr over 240 Minutes Intravenous Every 8 hours 12/02/17 1724     12/02/17 1330  piperacillin-tazobactam (ZOSYN) IVPB 3.375 g  Status:  Discontinued     3.375 g 100 mL/hr over 30 Minutes Intravenous  Once 12/02/17 1323 12/02/17 1326   12/02/17 1330  vancomycin (VANCOCIN) IVPB 1000 mg/200 mL premix     1,000 mg 200 mL/hr over 60 Minutes Intravenous  Once 12/02/17 1323  12/02/17 1445     Subjective: Seen and examined and pain was better controlled. Drain with good output. No Nausea or vomiting. No lightheadedness or dizziness. No other concerns or complaints at this time.   Objective: Vitals:   12/07/17 0421 12/07/17 0900 12/07/17 0911 12/07/17 1349  BP: (!) 93/54 108/63  101/63  Pulse: (!) 53  62 (!)  58  Resp: 16   15  Temp: 97.9 F (36.6 C)   98 F (36.7 C)  TempSrc: Oral   Oral  SpO2: 95% 97%  97%  Weight:      Height:        Intake/Output Summary (Last 24 hours) at 12/07/2017 1441 Last data filed at 12/07/2017 1352 Gross per 24 hour  Intake 1070 ml  Output 1125 ml  Net -55 ml   Filed Weights   12/05/17 1259  Weight: 69.9 kg (154 lb)   Examination: Physical Exam:  Constitutional: WN/WD Caucasian female in NAD Eyes: Sclerae anicteric. Lids normal ENMT: External Ears and nose appear normal Neck: Supple with no JVD Respiratory: Diminished to auscultation; No appreciable wheezing/rales/rhonchi. Unlabored breathing Cardiovascular: RRR; No m/r/g; No LE Edema Chest Wall: Has Port-A-Cath present Abdomen: Soft, NT, ND. Bowel sounds present. Biliary Drain present with good output GU: Deferrd Musculoskeletal: No contractures; No cyanosis Skin: Warm and Dry; No appreciable rashes or lesions on a limited skin eval Neurologic: CN 2-12 grossly intact. No appreciable focal deficits Psychiatric: Pleasant mood and affect. Intact judgement and insight  Data Reviewed: I have personally reviewed following labs and imaging studies  CBC: Recent Labs  Lab 12/02/17 1006 12/03/17 0515 12/04/17 0353 12/05/17 0536 12/06/17 0334 12/07/17 0403  WBC 9.9 7.8 5.7 5.9 6.4 5.5  NEUTROABS 8.7*  --  3.5 4.0 4.5 2.8  HGB  --  8.6* 8.4* 8.8* 9.2* 9.2*  HCT 31.6* 25.3* 24.5* 26.2* 27.4* 27.3*  MCV 88.0 86.9 88.8 87.9 88.4 88.3  PLT 241 235 282 272 335 078   Basic Metabolic Panel: Recent Labs  Lab 12/03/17 0515 12/04/17 0353 12/05/17 0536  12/06/17 0334 12/07/17 0403  NA 139 139 136 137 138  K 3.4* 3.5 3.8 3.5 3.2*  CL 108 107 102 101 99*  CO2 _0 GLUCOSE 104* 69 58* 82 107*  BUN _1 <5*  CREATININE 0.42* 0.52 0.54 0.55 0.62  CALCIUM 8.1* 8.3* 8.4* 8.4* 8.6*  MG 2.0 2.1 2.1 2.0 2.0  PHOS 3.0 2.7 3.4 3.3 3.3   GFR: Estimated Creatinine Clearance: 61.4 mL/min (by C-G formula based on SCr of 0.62 mg/dL). Liver Function Tests: Recent Labs  Lab 12/03/17 0515 12/04/17 0353 12/05/17 0536 12/06/17 0334 12/07/17 0403  AST 120* 102* 104* 80* 75*  ALT 57* 50 51 46 42  ALKPHOS 566* 564* 585* 609* 524*  BILITOT 4.6* 2.7* 3.6* 2.9* 2.2*  PROT 5.8* 5.7* 6.1* 6.2* 6.3*  ALBUMIN 2.4* 2.5* 2.6* 2.6* 2.8*   Recent Labs  Lab 12/02/17 1325  LIPASE 19   No results for input(s): AMMONIA in the last 168 hours. Coagulation Profile: Recent Labs  Lab 12/02/17 1928 12/04/17 0353  INR 1.70 1.47   Cardiac Enzymes: No results for input(s): CKTOTAL, CKMB, CKMBINDEX, TROPONINI in the last 168 hours. BNP (last 3 results) No results for input(s): PROBNP in the last 8760 hours. HbA1C: No results for input(s): HGBA1C in the last 72 hours. CBG: No results for input(s): GLUCAP in the last 168 hours. Lipid Profile: No results for input(s): CHOL, HDL, LDLCALC, TRIG, CHOLHDL, LDLDIRECT in the last 72 hours. Thyroid Function Tests: No results for input(s): TSH, T4TOTAL, FREET4, T3FREE, THYROIDAB in the last 72 hours. Anemia Panel: No results for input(s): VITAMINB12, FOLATE, FERRITIN, TIBC, IRON, RETICCTPCT in the last 72 hours. Sepsis Labs: Recent Labs  Lab 12/02/17 1344 12/02/17 1928 12/02/17 2323  PROCALCITON  --  4.09  --  LATICACIDVEN 1.41 1.0 1.0    Recent Results (from the past 240 hour(s))  Urine Culture     Status: None   Collection Time: 12/02/17 10:04 AM  Result Value Ref Range Status   Specimen Description   Final    URINE, CLEAN CATCH Performed at [facility], [address] [place], [place] [address]    Special Requests   Final    NONE Performed at [facility], [place] [address] [place], [place] [address]    Culture   Final    NO GROWTH Performed at [facility], [place] [address], [place], [place] [address]    Report Status 12/03/2017 FINAL  Final  Culture, Blood     Status: Abnormal   Collection Time: 12/02/17 11:39 AM  Result Value Ref Range Status   Specimen Description BLOOD RIGHT ARM  Final   Special Requests   Final    BOTTLES DRAWN AEROBIC AND ANAEROBIC Blood Culture results may not be optimal due to an excessive volume of blood received in culture bottles   Culture  Setup Time   Final    GRAM NEGATIVE RODS ANAEROBIC BOTTLE ONLY CRITICAL RESULT CALLED TO, READ BACK BY AND VERIFIED WITH: C SHADE PHARMD 1801 12/03/17 A BROWNING    Culture (A)  Final    BACTEROIDES FRAGILIS BETA LACTAMASE POSITIVE Performed at [facility], [place] [address] [place], [place] [address]    Report Status 12/06/2017 FINAL  Final  Blood Culture ID Panel (Reflexed)     Status: None   Collection Time: 12/02/17 11:39 AM  Result Value Ref Range Status   Enterococcus species NOT DETECTED NOT DETECTED Final   Listeria monocytogenes NOT DETECTED NOT DETECTED Final   Staphylococcus species NOT DETECTED NOT DETECTED Final   Staphylococcus aureus NOT DETECTED NOT DETECTED Final   Streptococcus species NOT DETECTED NOT DETECTED Final   Streptococcus agalactiae NOT DETECTED NOT DETECTED Final   Streptococcus pneumoniae NOT DETECTED NOT DETECTED Final   Streptococcus pyogenes NOT DETECTED NOT DETECTED Final   Acinetobacter baumannii NOT DETECTED NOT DETECTED Final   Enterobacteriaceae species NOT DETECTED NOT DETECTED Final   Enterobacter cloacae complex NOT DETECTED NOT DETECTED Final   Escherichia coli NOT DETECTED NOT DETECTED Final   Klebsiella oxytoca NOT DETECTED NOT DETECTED Final   Klebsiella pneumoniae NOT DETECTED NOT  DETECTED Final   Proteus species NOT DETECTED NOT DETECTED Final   Serratia marcescens NOT DETECTED NOT DETECTED Final   Haemophilus influenzae NOT DETECTED NOT DETECTED Final   Neisseria meningitidis NOT DETECTED NOT DETECTED Final   Pseudomonas aeruginosa NOT DETECTED NOT DETECTED Final   Candida albicans NOT DETECTED NOT DETECTED Final   Candida glabrata NOT DETECTED NOT DETECTED Final   Candida krusei NOT DETECTED NOT DETECTED Final   Candida parapsilosis NOT DETECTED NOT DETECTED Final   Candida tropicalis NOT DETECTED NOT DETECTED Final    Comment: Performed at [facility], [place] [address], [place], [place] [address]  Culture, Blood     Status: None   Collection Time: 12/02/17 11:50 AM  Result Value Ref Range Status   Specimen Description BLOOD PORTA CATH  Final   Special Requests   Final    BOTTLES DRAWN AEROBIC AND ANAEROBIC Blood Culture adequate volume   Culture   Final    NO GROWTH 5 DAYS Performed at [facility], [address] [place], [place] [address]    Report  Status 12/07/2017 FINAL  Final  Blood Culture (routine x 2)     Status: None   Collection Time: 12/02/17  1:27 PM  Result Value Ref Range Status   Specimen Description   Final    BLOOD LEFT PORTA CATH Performed at [place] [address] [place], [place] [address]    Special Requests   Final    BOTTLES DRAWN AEROBIC AND ANAEROBIC Blood Culture adequate volume Performed at [place] [address] [place], [place] [address]    Culture   Final    NO GROWTH 5 DAYS Performed at [facility], [place] [address] [place], [place] [address]    Report Status 12/07/2017 FINAL  Final  Respiratory Panel by PCR     Status: None   Collection Time: 12/02/17  3:40 PM  Result Value Ref Range Status   Adenovirus NOT DETECTED NOT DETECTED Final   Coronavirus 229E NOT DETECTED NOT DETECTED Final   Coronavirus HKU1 NOT DETECTED NOT DETECTED Final    Coronavirus NL63 NOT DETECTED NOT DETECTED Final   Coronavirus OC43 NOT DETECTED NOT DETECTED Final   Metapneumovirus NOT DETECTED NOT DETECTED Final   Rhinovirus / Enterovirus NOT DETECTED NOT DETECTED Final   Influenza A NOT DETECTED NOT DETECTED Final   Influenza B NOT DETECTED NOT DETECTED Final   Parainfluenza Virus 1 NOT DETECTED NOT DETECTED Final   Parainfluenza Virus 2 NOT DETECTED NOT DETECTED Final   Parainfluenza Virus 3 NOT DETECTED NOT DETECTED Final   Parainfluenza Virus 4 NOT DETECTED NOT DETECTED Final   Respiratory Syncytial Virus NOT DETECTED NOT DETECTED Final   Bordetella pertussis NOT DETECTED NOT DETECTED Final   Chlamydophila pneumoniae NOT DETECTED NOT DETECTED Final   Mycoplasma pneumoniae NOT DETECTED NOT DETECTED Final    Comment: Performed at [facility], [place] [address], [place], [place] [address]    Radiology Studies: Ir Percutaneous Transhepatic Cholangiogram  Result Date: 12/05/2017 INDICATION: Pancreatic carcinoma status post Whipple, now with epigastric pain and dilatation of the hepaticojejunostomy loop and bile ducts. Elevated bilirubin. Decompression is requested. EXAM: PERCUTANEOUS TRANSHEPATIC CHOLANGIOGRAM PERCUTANEOUS INTERNAL/EXTERNAL BILIARY DRAIN MEDICATIONS: Patient was already receiving adequate prophylactic antibiotic coverage. ANESTHESIA/SEDATION: Intravenous Fentanyl and Versed were administered as conscious sedation during continuous monitoring of the patient's level of consciousness and physiological / cardiorespiratory status by the radiology RN, with a total moderate sedation time of 23 minutes. PROCEDURE: Informed written consent was obtained from the patient after a thorough discussion of the procedural risks, benefits and alternatives. All questions were addressed. Maximal Sterile Barrier Technique was utilized including caps, mask, sterile gowns, sterile gloves, sterile drape, hand hygiene and skin antiseptic. A timeout was performed  prior to the initiation of the procedure. Limited ultrasound confirmed intrahepatic biliary ductal dilatation in the left lobe. An appropriate skin entry site was determined. Under real-time ultrasound guidance, a 21 gauge needle advanced into a dilated peripheral left lateral segment duct. Bowel spontaneously returned through the needle hub. A 018 guidewire advanced centrally easily. A 6 French transitional dilator was advanced over the wire. Contrast injection for percutaneous transhepatic cholangiogram was performed. This confirmed placement within the central intrahepatic biliary tree. There is intra and extrahepatic biliary ductal dilatation. Contrast did flow across the anastomosis at the hepaticojejunostomy, with dilatation of the bowel. The dilator was exchanged over a Bentson wire for a 5 [place] Kumpe catheter, advanced into the jejunal loop. Catheter exchanged over an Amplatz wire for vascular dilator which  facilitated advancement of a 74 [place] internal external biliary drain catheter, placed with sideholes spanning the intrahepatic biliary tree, extending into the anastomosed jejunal loop. Contrast injection confirmed good position and patency. The catheter was secured externally with 0 Prolene suture and StatLock and placed to external drainage. The patient tolerated the procedure well. FLUOROSCOPY TIME:  2 minutes 54 seconds; 47 mGy COMPLICATIONS: None immediate IMPRESSION: 1. Percutaneous transhepatic cholangiogram demonstrates intra and extrahepatic biliary ductal dilatation with patency of the hepaticojejunostomy anastomosis. 2. Technically successful internal/external biliary drain catheter placement as above. PLAN: Allow biliary decompression with internal /external drainage. Follow bilirubin. We can do a follow-up cholangiogram next week to assess the level of obstruction, possible brush biopsy. Electronically Signed   By: Lucrezia Europe M.D.   On: 12/05/2017 17:36   Scheduled Meds: .  amitriptyline  50 mg Oral QHS  . flecainide  100 mg Oral BID  . fluticasone  2 spray Each Nare Daily  . metoprolol succinate  12.5 mg Oral Daily  . potassium chloride  40 mEq Oral BID  . sodium chloride flush  5 mL Intracatheter Q8H   Continuous Infusions: . piperacillin-tazobactam (ZOSYN)  IV 3.375 g (12/07/17 1422)    LOS: 5 days   Kerney Elbe, DO [facility] Pager [phone]  If 7PM-7AM, please contact night-coverage [email or web address] Password [facility] 12/07/2017, 2:41 PM

## 2017-12-07 NOTE — Anesthesia Preprocedure Evaluation (Signed)
Anesthesia Evaluation  Patient identified by MRN, date of birth, ID band Patient awake    Reviewed: Allergy & Precautions, NPO status , Patient's Chart, lab work & pertinent test results  Airway Mallampati: II  TM Distance: >3 FB Neck ROM: Full    Dental no notable dental hx.    Pulmonary neg pulmonary ROS,    Pulmonary exam normal breath sounds clear to auscultation       Cardiovascular hypertension, Normal cardiovascular exam Rhythm:Regular Rate:Normal     Neuro/Psych    GI/Hepatic   Endo/Other    Renal/GU      Musculoskeletal   Abdominal   Peds  Hematology   Anesthesia Other Findings   Reproductive/Obstetrics                             Anesthesia Physical Anesthesia Plan  ASA: III  Anesthesia Plan: MAC   Post-op Pain Management:    Induction:   PONV Risk Score and Plan:   Airway Management Planned: Mask, Natural Airway and Nasal Cannula  Additional Equipment:   Intra-op Plan:   Post-operative Plan:   Informed Consent: I have reviewed the patients History and Physical, chart, labs and discussed the procedure including the risks, benefits and alternatives for the proposed anesthesia with the patient or authorized representative who has indicated his/her understanding and acceptance.     Plan Discussed with: CRNA  Anesthesia Plan Comments:         Anesthesia Quick Evaluation

## 2017-12-07 NOTE — Progress Notes (Signed)
General Surgery [facility]  Assessment & Plan: HD#6 - biliary obstruction, hx of Whipple for pancreatic Ca  PTC placed by IR with symptomatic improvement  Regular diet  OOB, ambulation  Monitor bile output  Will discuss with Dr. Barry Dienes - next steps?        Earnstine Regal, MD, [facility]       Office: [phone]    Chief Complaint: Biliary obstruction, hx of pancreatic Ca  Subjective: Patient in bed, comfortable, husband at bedside.  Ate some lunch.  Wants to walk.  Objective: Vital signs in last 24 hours: Temp:  [97.9 F (36.6 C)-98.1 F (36.7 C)] 97.9 F (36.6 C) (03/17 0421) Pulse Rate:  [53-62] 62 (03/17 0911) Resp:  [16-18] 16 (03/17 0421) BP: (93-117)/(54-75) 108/63 (03/17 0900) SpO2:  [95 %-99 %] 97 % (03/17 0900) Last BM Date: 12/05/17(per pt )  Intake/Output from previous day: 03/16 0701 - 03/17 0700 In: 830 [P.O.:720; IV Piggyback:100] Out: 1125 [Drains:1125] Intake/Output this shift: Total I/O In: -  Out: 100 [Drains:100]  Physical Exam: HEENT - sclerae clear, mucous membranes moist Neck - soft Chest - clear bilaterally Cor - RRR Abdomen - soft, non-tender; drain LUQ with bilious output in bag Ext - no edema, non-tender Neuro - alert & oriented, no focal deficits  Lab Results:  Recent Labs    12/06/17 0334 12/07/17 0403  WBC 6.4 5.5  HGB 9.2* 9.2*  HCT 27.4* 27.3*  PLT 335 359   BMET Recent Labs    12/06/17 0334 12/07/17 0403  NA 137 138  K 3.5 3.2*  CL 101 99*  CO2 22 29  GLUCOSE 82 107*  BUN 7 <5*  CREATININE 0.55 0.62  CALCIUM 8.4* 8.6*   PT/INR No results for input(s): LABPROT, INR in the last 72 hours. Comprehensive Metabolic Panel:    Component Value Date/Time   NA 138 12/07/2017 0403   NA 137 12/06/2017 0334   NA 138 06/03/2017 0937   NA 137 04/15/2017 0955   K 3.2 (L) 12/07/2017 0403   K 3.5 12/06/2017 0334   K 4.0 06/03/2017 0937   K 4.0 04/15/2017 0955    CL 99 (L) 12/07/2017 0403   CL 101 12/06/2017 0334   CO2 29 12/07/2017 0403   CO2 22 12/06/2017 0334   CO2 24 06/03/2017 0937   CO2 25 04/15/2017 0955   BUN <5 (L) 12/07/2017 0403   BUN 7 12/06/2017 0334   BUN 13.0 06/03/2017 0937   BUN 12.2 04/15/2017 0955   CREATININE 0.62 12/07/2017 0403   CREATININE 0.55 12/06/2017 0334   CREATININE 0.76 12/02/2017 1006   CREATININE 0.65 11/18/2017 0850   CREATININE 0.8 06/03/2017 0937   CREATININE 0.8 04/15/2017 0955   GLUCOSE 107 (H) 12/07/2017 0403   GLUCOSE 82 12/06/2017 0334   GLUCOSE 111 06/03/2017 0937   GLUCOSE 156 (H) 04/15/2017 0955   CALCIUM 8.6 (L) 12/07/2017 0403   CALCIUM 8.4 (L) 12/06/2017 0334   CALCIUM 9.2 06/03/2017 0937   CALCIUM 8.9 04/15/2017 0955   AST 75 (H) 12/07/2017 0403   AST 80 (H) 12/06/2017 0334   AST 114 (H) 12/02/2017 1006   AST 125 (H) 11/18/2017 0850   AST 55 (H) 06/03/2017 0937   AST 53 (H) 02/26/2017 0956   ALT 42 12/07/2017 0403   ALT 46 12/06/2017 0334   ALT 60 (H) 12/02/2017 1006   ALT 79 (H) 11/18/2017 0850  ALT 38 06/03/2017 0937   ALT 36 02/26/2017 0956   ALKPHOS 524 (H) 12/07/2017 0403   ALKPHOS 609 (H) 12/06/2017 0334   ALKPHOS 142 06/03/2017 0937   ALKPHOS 144 02/26/2017 0956   BILITOT 2.2 (H) 12/07/2017 0403   BILITOT 2.9 (H) 12/06/2017 0334   BILITOT 3.4 (H) 12/02/2017 1006   BILITOT 1.1 11/18/2017 0850   BILITOT 0.56 06/03/2017 0937   BILITOT 0.56 02/26/2017 0956   PROT 6.3 (L) 12/07/2017 0403   PROT 6.2 (L) 12/06/2017 0334   PROT 6.9 06/03/2017 0937   PROT 6.8 02/26/2017 0956   ALBUMIN 2.8 (L) 12/07/2017 0403   ALBUMIN 2.6 (L) 12/06/2017 0334   ALBUMIN 3.6 06/03/2017 0937   ALBUMIN 3.6 02/26/2017 0956    Studies/Results: Ir Percutaneous Transhepatic Cholangiogram  Result Date: 12/05/2017 INDICATION: Pancreatic carcinoma status post Whipple, now with epigastric pain and dilatation of the hepaticojejunostomy loop and bile ducts. Elevated bilirubin. Decompression is  requested. EXAM: PERCUTANEOUS TRANSHEPATIC CHOLANGIOGRAM PERCUTANEOUS INTERNAL/EXTERNAL BILIARY DRAIN MEDICATIONS: Patient was already receiving adequate prophylactic antibiotic coverage. ANESTHESIA/SEDATION: Intravenous Fentanyl and Versed were administered as conscious sedation during continuous monitoring of the patient's level of consciousness and physiological / cardiorespiratory status by the radiology RN, with a total moderate sedation time of 23 minutes. PROCEDURE: Informed written consent was obtained from the patient after a thorough discussion of the procedural risks, benefits and alternatives. All questions were addressed. Maximal Sterile Barrier Technique was utilized including caps, mask, sterile gowns, sterile gloves, sterile drape, hand hygiene and skin antiseptic. A timeout was performed prior to the initiation of the procedure. Limited ultrasound confirmed intrahepatic biliary ductal dilatation in the left lobe. An appropriate skin entry site was determined. Under real-time ultrasound guidance, a 21 gauge needle advanced into a dilated peripheral left lateral segment duct. Bowel spontaneously returned through the needle hub. A 018 guidewire advanced centrally easily. A 6 French transitional dilator was advanced over the wire. Contrast injection for percutaneous transhepatic cholangiogram was performed. This confirmed placement within the central intrahepatic biliary tree. There is intra and extrahepatic biliary ductal dilatation. Contrast did flow across the anastomosis at the hepaticojejunostomy, with dilatation of the bowel. The dilator was exchanged over a Bentson wire for a 5 [place] Kumpe catheter, advanced into the jejunal loop. Catheter exchanged over an Amplatz wire for vascular dilator which facilitated advancement of a 10 [place] internal external biliary drain catheter, placed with sideholes spanning the intrahepatic biliary tree, extending into the anastomosed jejunal loop. Contrast  injection confirmed good position and patency. The catheter was secured externally with 0 Prolene suture and StatLock and placed to external drainage. The patient tolerated the procedure well. FLUOROSCOPY TIME:  2 minutes 54 seconds; 47 mGy COMPLICATIONS: None immediate IMPRESSION: 1. Percutaneous transhepatic cholangiogram demonstrates intra and extrahepatic biliary ductal dilatation with patency of the hepaticojejunostomy anastomosis. 2. Technically successful internal/external biliary drain catheter placement as above. PLAN: Allow biliary decompression with internal /external drainage. Follow bilirubin. We can do a follow-up cholangiogram next week to assess the level of obstruction, possible brush biopsy. Electronically Signed   By: Lucrezia Europe M.D.   On: 12/05/2017 17:36      Johna Kearl M 12/07/2017  Patient ID: [name], female   DOB: [birth year], 72 y.o.   MRN: [id number]

## 2017-12-08 ENCOUNTER — Encounter (HOSPITAL_COMMUNITY): Payer: Self-pay | Admitting: Internal Medicine

## 2017-12-08 ENCOUNTER — Ambulatory Visit: Payer: Medicare Other | Admitting: Oncology

## 2017-12-08 DIAGNOSIS — B966 Bacteroides fragilis [B. fragilis] as the cause of diseases classified elsewhere: Secondary | ICD-10-CM

## 2017-12-08 LAB — CBC WITH DIFFERENTIAL/PLATELET
BASOS ABS: 0 10*3/uL (ref 0.0–0.1)
Basophils Relative: 0 %
EOS PCT: 5 %
Eosinophils Absolute: 0.3 10*3/uL (ref 0.0–0.7)
HCT: 31.3 % — ABNORMAL LOW (ref 36.0–46.0)
Hemoglobin: 10.2 g/dL — ABNORMAL LOW (ref 12.0–15.0)
LYMPHS PCT: 27 %
Lymphs Abs: 1.6 10*3/uL (ref 0.7–4.0)
MCH: 29.2 pg (ref 26.0–34.0)
MCHC: 32.6 g/dL (ref 30.0–36.0)
MCV: 89.7 fL (ref 78.0–100.0)
MONO ABS: 0.4 10*3/uL (ref 0.1–1.0)
Monocytes Relative: 7 %
Neutro Abs: 3.6 10*3/uL (ref 1.7–7.7)
Neutrophils Relative %: 61 %
PLATELETS: 397 10*3/uL (ref 150–400)
RBC: 3.49 MIL/uL — ABNORMAL LOW (ref 3.87–5.11)
RDW: 14.8 % (ref 11.5–15.5)
WBC: 5.9 10*3/uL (ref 4.0–10.5)

## 2017-12-08 LAB — COMPREHENSIVE METABOLIC PANEL
ALT: 48 U/L (ref 14–54)
AST: 96 U/L — AB (ref 15–41)
Albumin: 3 g/dL — ABNORMAL LOW (ref 3.5–5.0)
Alkaline Phosphatase: 485 U/L — ABNORMAL HIGH (ref 38–126)
Anion gap: 10 (ref 5–15)
BUN: <5 mg/dL — ABNORMAL LOW (ref 6–20)
CHLORIDE: 101 mmol/L (ref 101–111)
CO2: 27 mmol/L (ref 22–32)
Calcium: 9 mg/dL (ref 8.9–10.3)
Creatinine, Ser: 0.56 mg/dL (ref 0.44–1.00)
Glucose, Bld: 107 mg/dL — ABNORMAL HIGH (ref 65–99)
POTASSIUM: 3.5 mmol/L (ref 3.5–5.1)
Sodium: 138 mmol/L (ref 135–145)
Total Bilirubin: 1.9 mg/dL — ABNORMAL HIGH (ref 0.3–1.2)
Total Protein: 6.9 g/dL (ref 6.5–8.1)

## 2017-12-08 LAB — MAGNESIUM: MAGNESIUM: 2 mg/dL (ref 1.7–2.4)

## 2017-12-08 LAB — PHOSPHORUS: PHOSPHORUS: 2.9 mg/dL (ref 2.5–4.6)

## 2017-12-08 NOTE — Progress Notes (Signed)
These preliminary result these preliminary results were noted.  Awaiting final report.

## 2017-12-08 NOTE — Care Management Important Message (Signed)
Important Message  Patient Details  Name: [name] MRN: [id number] Date of Birth: [birth year]   Medicare Important Message Given:  Yes    Kerin Salen 12/08/2017, 11:14 AMImportant Message  Patient Details  Name: [name] MRN: [id number] Date of Birth: August 27, 1946   Medicare Important Message Given:  Yes    Kerin Salen 12/08/2017, 11:14 AM

## 2017-12-08 NOTE — Progress Notes (Signed)
Patient ID: [name], female   DOB: 04/09/1946, 72 y.o.   MRN: [id number] Cholangiogram and possible brush biopsy was postponed due to technical issues with the IR suite.  Hopefully, we will be able to proceed with procedure on 12/09/17.

## 2017-12-08 NOTE — Progress Notes (Signed)
Referring Physician(s): Perry,J/Sheikh,O  Supervising Physician: Markus Daft  Patient Status:  [facility] - In-pt  Chief Complaint: Pancreatic cancer, elevated liver function tests, abdominal pain      Subjective: Pt feeling better since biliary drain placed; denies sig abd pain, N/V or resp difficulties   Allergies: Ace inhibitors; Scopolamine; and Sulfa antibiotics  Medications: Prior to Admission medications   Medication Sig Start Date End Date Taking? Authorizing Provider  acetaminophen (TYLENOL) 500 MG tablet Take 1,000 mg by mouth every 6 (six) hours as needed for pain.    Yes [provider]  albuterol (PROVENTIL HFA;VENTOLIN HFA) 108 (90 Base) MCG/ACT inhaler Inhale 2 puffs into the lungs every 6 (six) hours as needed for wheezing or shortness of breath. 01/27/17  Yes Mikhail, Velta Addison, DO  amitriptyline (ELAVIL) 25 MG tablet Take 2 tablets (50 mg total) by mouth at bedtime. 10/23/17  Yes Ladell Pier, MD  diphenhydrAMINE (BENADRYL) 25 MG tablet Take 50 mg by mouth every 6 (six) hours as needed for itching or allergies.    Yes [provider]  diphenoxylate-atropine (LOMOTIL) 2.5-0.025 MG tablet Take 1-2 tablets by mouth 4 (four) times daily as needed for diarrhea or loose stools. 01/21/17  Yes Ladell Pier, MD  flecainide (TAMBOCOR) 100 MG tablet TAKE 1 TABLET BY MOUTH TWICE A DAY 02/24/17  Yes Eileen Stanford, PA-C  fluticasone (FLONASE) 50 MCG/ACT nasal spray Place 1 spray into both nostrils 2 (two) times daily as needed for allergies.    Yes [provider]  guaiFENesin (MUCINEX) 600 MG 12 hr tablet Take 600 mg by mouth every morning.    Yes [provider]  lidocaine-prilocaine (EMLA) cream Apply small amount over port area 1-2 hours prior to treatment and cover with plastic wrap.  DO NOT RUB IN. 07/09/16  Yes Ladell Pier, MD  LORazepam (ATIVAN) 1 MG tablet Take 0.5 tablets (0.5 mg total) by mouth every 8 (eight) hours as  needed for anxiety. 05/10/15  Yes Owens Shark, NP  metoprolol succinate (TOPROL-XL) 25 MG 24 hr tablet Take 12.5 mg by mouth daily.   Yes [provider]  morphine (MSIR) 15 MG tablet Take 1 tablet (15 mg total) by mouth every 4 (four) hours as needed for severe pain. 11/19/17  Yes Ladell Pier, MD  promethazine (PHENERGAN) 12.5 MG tablet Take 1 tablet (12.5 mg total) by mouth every 6 (six) hours as needed for nausea or vomiting. 06/03/17  Yes Ladell Pier, MD  XARELTO 20 MG TABS tablet TAKE 1 TABLET BY MOUTH EVERY DAY 07/14/17  Yes Jerline Pain, MD  zolpidem (AMBIEN CR) 6.25 MG CR tablet Take 1 tablet (6.25 mg total) by mouth at bedtime. 02/26/17  Yes Ladell Pier, MD  HYDROcodone-acetaminophen (NORCO/VICODIN) 5-325 MG tablet Take 1-2 tablets by mouth every 4 (four) hours as needed for moderate pain. Patient not taking: Reported on 12/02/2017 11/19/17   Ladell Pier, MD  lipase/protease/amylase (CREON) 12000 UNITS CPEP capsule Take 2 capsules (24,000 Units total) by mouth 3 (three) times daily before meals. Patient not taking: Reported on 12/02/2017 01/05/15   Owens Shark, NP     Vital Signs: BP 125/85 (BP Location: Left Arm)   Pulse (!) 117   Temp 98.8 F (37.1 C) (Oral)   Resp 18   Ht '5\' 3"'  (1.6 m)   Wt 154 lb (69.9 kg)   LMP  (LMP Unknown)   SpO2 90%   BMI 27.28 kg/m  Physical Exam: awake/alert; chest- CTA bilat; heart- irreg irregular, nl rate; clean left chest wall PAC; abd- soft,+BS, biliary drain intact, dressing dry, site not sig tender, output 350 cc green bile; no LE edema  Imaging: Ir Percutaneous Transhepatic Cholangiogram  Result Date: 12/05/2017 INDICATION: Pancreatic carcinoma status post Whipple, now with epigastric pain and dilatation of the hepaticojejunostomy loop and bile ducts. Elevated bilirubin. Decompression is requested. EXAM: PERCUTANEOUS TRANSHEPATIC CHOLANGIOGRAM PERCUTANEOUS INTERNAL/EXTERNAL BILIARY DRAIN MEDICATIONS: Patient was  already receiving adequate prophylactic antibiotic coverage. ANESTHESIA/SEDATION: Intravenous Fentanyl and Versed were administered as conscious sedation during continuous monitoring of the patient's level of consciousness and physiological / cardiorespiratory status by the radiology RN, with a total moderate sedation time of 23 minutes. PROCEDURE: Informed written consent was obtained from the patient after a thorough discussion of the procedural risks, benefits and alternatives. All questions were addressed. Maximal Sterile Barrier Technique was utilized including caps, mask, sterile gowns, sterile gloves, sterile drape, hand hygiene and skin antiseptic. A timeout was performed prior to the initiation of the procedure. Limited ultrasound confirmed intrahepatic biliary ductal dilatation in the left lobe. An appropriate skin entry site was determined. Under real-time ultrasound guidance, a 21 gauge needle advanced into a dilated peripheral left lateral segment duct. Bowel spontaneously returned through the needle hub. A 018 guidewire advanced centrally easily. A 6 French transitional dilator was advanced over the wire. Contrast injection for percutaneous transhepatic cholangiogram was performed. This confirmed placement within the central intrahepatic biliary tree. There is intra and extrahepatic biliary ductal dilatation. Contrast did flow across the anastomosis at the hepaticojejunostomy, with dilatation of the bowel. The dilator was exchanged over a Bentson wire for a 5 [place] Kumpe catheter, advanced into the jejunal loop. Catheter exchanged over an Amplatz wire for vascular dilator which facilitated advancement of a 10 [place] internal external biliary drain catheter, placed with sideholes spanning the intrahepatic biliary tree, extending into the anastomosed jejunal loop. Contrast injection confirmed good position and patency. The catheter was secured externally with 0 Prolene suture and StatLock and placed to  external drainage. The patient tolerated the procedure well. FLUOROSCOPY TIME:  2 minutes 54 seconds; 47 mGy COMPLICATIONS: None immediate IMPRESSION: 1. Percutaneous transhepatic cholangiogram demonstrates intra and extrahepatic biliary ductal dilatation with patency of the hepaticojejunostomy anastomosis. 2. Technically successful internal/external biliary drain catheter placement as above. PLAN: Allow biliary decompression with internal /external drainage. Follow bilirubin. We can do a follow-up cholangiogram next week to assess the level of obstruction, possible brush biopsy. Electronically Signed   By: Lucrezia Europe M.D.   On: 12/05/2017 17:36    Labs:  CBC: Recent Labs    12/05/17 0536 12/06/17 0334 12/07/17 0403 12/08/17 0500  WBC 5.9 6.4 5.5 5.9  HGB 8.8* 9.2* 9.2* 10.2*  HCT 26.2* 27.4* 27.3* 31.3*  PLT 272 335 359 397    COAGS: Recent Labs    12/02/17 1928 12/04/17 0353  INR 1.70 1.47  APTT 41*  --     BMP: Recent Labs    12/05/17 0536 12/06/17 0334 12/07/17 0403 12/08/17 0500  NA 136 137 138 138  K 3.8 3.5 3.2* 3.5  CL 102 101 99* 101  CO2 '22 22 29 27  ' GLUCOSE 58* 82 107* 107*  BUN 7 7 <5* <5*  CALCIUM 8.4* 8.4* 8.6* 9.0  CREATININE 0.54 0.55 0.62 0.56  GFRNONAA >60 >60 >60 >60  GFRAA >60 >60 >60 >60    LIVER FUNCTION TESTS: Recent Labs    12/05/17 0536 12/06/17 0334 12/07/17  0403 12/08/17 0500  BILITOT 3.6* 2.9* 2.2* 1.9*  AST 104* 80* 75* 96*  ALT 51 46 42 48  ALKPHOS 585* 609* 524* 485*  PROT 6.1* 6.2* 6.3* 6.9  ALBUMIN 2.6* 2.6* 2.8* 3.0*    Assessment and Plan: 72 y.o. 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  s/p PTC with I/E biliary drain 3/15 due to elevated LFT's /dilatation of hepaticojejunostomy loop and bile ducts;  afebrile; t bili 1.9(2.2), creat nl; hgb 10.2, WBC nl; will set pt up for f/u cholangiogram with poss bile duct brush bx this afternoon- details/risks d/w pt/family with their understanding and consent.     Electronically Signed: D. Rowe Robert, PA-C 12/08/2017, 1:47 PM   I spent a total of 20 minutes at the the patient's bedside AND on the patient's hospital floor or unit, greater than 50% of which was counseling/coordinating care for biliary drain    Patient ID: [name], female   DOB: 01-Feb-1946, 72 y.o.   MRN: [id number]

## 2017-12-08 NOTE — Progress Notes (Signed)
3 Days Post-Op    CC:  abdominal pain, diarrhea, and fever  Subjective: She feels allot better today, has not eater today awaiting next step in diagnosis/care. She was not sure if they were going to do something today or not.    Objective: Vital signs in last 24 hours: Temp:  [98 F (36.7 C)-98.9 F (37.2 C)] 98.3 F (36.8 C) (03/18 0545) Pulse Rate:  [58-68] 62 (03/18 0545) Resp:  [15-16] 16 (03/18 0545) BP: (101-121)/(56-70) 106/56 (03/18 0545) SpO2:  [96 %-99 %] 96 % (03/18 0545) Last BM Date: 12/05/17 240 PO Drain 850 Afebrile, VSS Bilirubin/LFT's are better WBC is 5.9 Intake/Output from previous day: 03/17 0701 - 03/18 0700 In: 250 [P.O.:240] Out: 850 [Drains:850] Intake/Output this shift: Total I/O In: 120 [P.O.:120] Out: -   General appearance: alert, cooperative and no distress Resp: clear to auscultation bilaterally GI: soft, sore, but not tender right now. Tolerated diet yesterday.  Lab Results:  Recent Labs    12/07/17 0403 12/08/17 0500  WBC 5.5 5.9  HGB 9.2* 10.2*  HCT 27.3* 31.3*  PLT 359 397    BMET Recent Labs    12/07/17 0403 12/08/17 0500  NA 138 138  K 3.2* 3.5  CL 99* 101  CO2 29 27  GLUCOSE 107* 107*  BUN <5* <5*  CREATININE 0.62 0.56  CALCIUM 8.6* 9.0   PT/INR No results for input(s): LABPROT, INR in the last 72 hours.  Recent Labs  Lab 12/04/17 0353 12/05/17 0536 12/06/17 0334 12/07/17 0403 12/08/17 0500  AST 102* 104* 80* 75* 96*  ALT 50 51 46 42 48  ALKPHOS 564* 585* 609* 524* 485*  BILITOT 2.7* 3.6* 2.9* 2.2* 1.9*  PROT 5.7* 6.1* 6.2* 6.3* 6.9  ALBUMIN 2.5* 2.6* 2.6* 2.8* 3.0*     Lipase     Component Value Date/Time   LIPASE 19 12/02/2017 1325   LIPASE 5 (L) 06/26/2017 1446     Medications: . amitriptyline  50 mg Oral QHS  . flecainide  100 mg Oral BID  . fluticasone  2 spray Each Nare Daily  . metoprolol succinate  12.5 mg Oral Daily  . potassium chloride  40 mEq Oral BID  . sodium chloride flush   5 mL Intracatheter Q8H   . piperacillin-tazobactam (ZOSYN)  IV Stopped (12/08/17 1036)   Anti-infectives (From admission, onward)   Start     Dose/Rate Route Frequency Ordered Stop   12/02/17 2200  vancomycin (VANCOCIN) 500 mg in sodium chloride 0.9 % 100 mL IVPB  Status:  Discontinued     500 mg 100 mL/hr over 60 Minutes Intravenous Every 12 hours 12/02/17 1726 12/03/17 1857   12/02/17 2100  piperacillin-tazobactam (ZOSYN) IVPB 3.375 g     3.375 g 12.5 mL/hr over 240 Minutes Intravenous Every 8 hours 12/02/17 1724     12/02/17 1330  piperacillin-tazobactam (ZOSYN) IVPB 3.375 g  Status:  Discontinued     3.375 g 100 mL/hr over 30 Minutes Intravenous  Once 12/02/17 1323 12/02/17 1326   12/02/17 1330  vancomycin (VANCOCIN) IVPB 1000 mg/200 mL premix     1,000 mg 200 mL/hr over 60 Minutes Intravenous  Once 12/02/17 1323 12/02/17 1445     Assessment/Plan Fever Active Problems: Abdominal pain Hyponatremia Adenocarcinoma of head of pancreas (HCC) Chronic anticoagulation Anemia Chronic atrial fibrillation (HCC) Hypotension/History of hypertension History of endometrial cancer History of GERD/gastritis.   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 [name]. History of PAF (on Xarelto)  Last dose 12/01/17  - pt's paincontinues to improve and fevers have resolved, WBC WNL - labs are improving and Tbili is down to 2.7.  -IR not going to place drain due to improvement in labs -  EGD Whipple, with angulation of afferent room limb, prevents endoscopy - afferent loop         Syndrome  -  Perc transhepatic cholangiogram, Perc biliary int/ext drain 19f; 12/05/17 IR   FEN:  Soft diet ID:  Vancomycin 3/12-3/13/19 DVT: SCD Foley: None Follow up:  IR/Dr. Byerly/Dr. [name]  Plan:   She is doing better, will check and see what IR plans about obtaining brushings to help develop next step in care plan.       LOS: 6 days    Kathyleen Radice 12/08/2017 [phone]

## 2017-12-08 NOTE — Progress Notes (Signed)
PROGRESS NOTE    [name]  YHC:[id number] DOB: [birth year] DOA: 12/02/2017 PCP: Chesley Noon, MD   Brief Narrative:  HPI [name]:  [name] is a 72 y.o. 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  Presented with abdominal pain ongoing for 2-3 weeks. It was 7 out of 10 in intensity.  Sharp pain in the upper abdomen without any radiation.  No precipitating factors identified.  No aggravating or relieving factors.  She was seen at the cancer center.  It appears that she is been having the symptoms since the end of February.  At that time oncology was consider referring the patient to gastroenterology.  Patient was diagnosed with sinusitis and was placed on oral antibiotics but she developed diarrhea.  She saw her primary care physician yesterday and took a stool sample which was sent for C. difficile. Those results are not available yet.  But she stopped taking her antibiotics and her diarrhea has resolved.  Her abdominal pain however continues.  She has had some nausea but no vomiting.  Denies any facial pain.  Denies skin rashes shortness of breath cough.  Denies any joint pains.  Some pressure with urinating but denies any burning sensation.  Evaluation in the emergency department reveals that patient is febrile.  Patient was noted to have borderline low blood pressures.  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  Patient will need hospitalization for further management.  There is concern for sepsis.  *Patient under went EGD and PTC/External Drain afterwards and pain is improved. Still having significant biliary drainage. General Surgery following and to discuss with Dr.  Barry Dienes about further steps in care as Bilirubin is improving. Discussed with Dr. Anselm Pancoast today and possible Cholangiogram to asshe level of obstruction and possible brush biopsy to be done later this afternoon.   Assessment & Plan:   Principal Problem:   Fever Active Problems:   Abdominal pain   Hyponatremia   Adenocarcinoma of head of pancreas (HCC)   Chronic anticoagulation   Anemia   Chronic atrial fibrillation (HCC)   Hypotension   Sepsis (HCC)   Afferent loop syndrome   Abnormal CT of the abdomen  Sepsis likely from Cholangitis with Bacteroides Fragiilis Bacteremia -Etiology unclear but likley GI/biliary in origin.   -Blood Cx Showed GNR in Anaerobic Bottle Only but BCID showed none detected; Result showing Bacteroides Fragilis which is Beta Lactamase Positive -Considering her abnormal LFTs and findings and CT scan cholangitis and obstructive duct needs to be considered.  Follow-up on culture data.   -D/C'd Vancomycin given GNR and will continue for now Zosyn for now. -Lactic acid level is reassuringly normal and went from 1.41 -> 1.0 -Pro-calcitonin was 4.09. -Patient's has been afebrile and she has no White Count and WBC is 5.9 -IVF Rehydration with NS at 125 mL/hr now stopped  -Sepsis physiology improving  -Repeat CBC in AM   Abdominal Pain, improved  -CT scan showed Mildly progressive intrahepatic and extrahepatic ductal dilatation, as described above. Associated dilatation of the afferent limb. 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  -General Surgery has been consulted and recommended GI consult to eval the Afferent Limb and IR to see if  they can decompress the biliary system with transhepatic drain -Gastroenterology consulted and MRCP done.See below.  -IR Consulted and Percutaneous Transhepatic Cholangiogram and Biliary Drain Placement after EGD; Was successful and patient's pain is improved and Drain is copious     -General Surgery following and will Discuss with Dr. Barry Dienes about next steps of Care; In the interm continue to Hold Xarelto for any possible surgical procedure if any  Abnormal liver function tests including elevated alkaline phosphatase, bilirubin and transaminases -AST went from 120 -> 96 -ALT went from 60 -> 48 -As discussed above.  -MRCP showed Postoperative changes from Whipple procedure again noted. On today's study there is marked and progressive dilatation of the hepaticojejunostomy loop, bile ducts and main pancreatic duct. Focal area of narrowing of the hepaticojejunostomy loop as it crosses the midline from right to left. Findings may represent underlying postsurgical scarring and stricture formation versus recurrent Tumor. There was also a focal area of abnormal signal in the region of the caudate lobe of liver with increased enhancement. It is unclear whether not this is abnormal signal and enhancement within the liver parenchyma or adenopathy -Gastroenterology Consulted for further evaluation and treatment and recommending C/w IV Zosyn for now given Bacteroides Fragilis  -GI did EGD 12/05/17 and angulation at the entrance of the afferent limb was not permitting the passage of the endoscope despite effort and GI could not r/o Afferent Loop Syndrome.  -IR consulted and underwent Percutaneous Drainage of the Bile Duct 3/151/19; Last dose of Xarelto 12/01/17. Discsused with Dr. Anselm Pancoast in IR and patient to undergo Brush Biopsy later this afternoon and possible Cholangiogram to assess level of obstruction -General Surgery following and will continue to Hold Xarelto in case Surgical procedure is needed; Per Dr. Harlow Asa, c/w OOB and Ambulation and continue to Monitor Bile output; Dr. Barry Dienes to discuss about next steps in care   Hypotension -She is asymptomatic and BP is improving and BP was 150/81 this AM  -D/C'd IVF Rehydration .  Hyponatremia, improved -Improved. Was likely from  Hypovolemia -Patient's Na+ went from 129 -> 138 -IVF now D/C"d  -Continue to Monitor  -Repeat CMP in AM   Normocytic Anemia -Likely Hemo-concentrated on Admission and likley dilutional Drop now  -Likely Anemia of chronic disease.  No overt blood loss noted. -Hb/Hct now stable at 10.2/31.3 -Continue to Monitor for S/Sx of Bleeding as she was previously Anticoagulated with Xarelto  -Repeat CBC in AM   Recent Diarrhea, improved  -Probably due to antibiotics.   -She took a stool sample to her PCP prior to Admission. Those results are not available to [place] yet.  But her diarrhea has resolved.   We will continue to monitor for no and doubt it is C Difficile given no WBC.Marland Kitchen  History of Pancreatic Cancer -Followed by Oncology Dr. Julieanne Manson.  -Patient's CA 19-9 starting to elevate and went from 24 -> 316 -MRCP showed Postoperative changes from Whipple procedure again noted. On today's study there is marked and progressive dilatation of the hepaticojejunostomy loop, bile ducts and main pancreatic duct. Focal area of narrowing of the hepaticojejunostomy loop as it crosses the midline from right to left. Findings may represent underlying postsurgical scarring and stricture formation versus recurrent tumor. -Not on Chemotherapy currently.   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  -Pain control as before -General Surgery to Weigh in about next steps of care. -IR to get Brush Biopsy and Cholangiogram likely later this afternoon   History of Paroxysmal Atrial Fibrillation -  She is on Flecainide which will be continued at 100 mg po BID.  -Will resume her Home Metoprolol XL 12.5 mg po Daily  -She is on Xarelto which was held; Continue to Hold given General Surgery's possibility for intervention and because of Brush Biopsy today    Hypokalemia -Patient's K+ was 3.2 and improved to 3.5 this AM  -Replete with po KCl 40 mEQ BID -Continue to Monitor and Replete as  Necessary -Repeat CMP in AM   Hyperbilirubinemia -Improving. T Bili went from 4.6 -> 1.9 -As above -Repeat CMP in AM   DVT prophylaxis: SCDs  Code Status: DO NOT RESUSCITATE  Family Communication: Discussed with Husband at bedside  Disposition Plan: Remain Inpatient for continued work up and treatment   Consultants:   General Surgery  Gastroenterology  Oncology   IR  Procedures:  Pertcutaneous Transhepatic Cholangiogram with Biliary Drain  1. Percutaneous transhepatic cholangiogram demonstrates intra and extrahepatic biliary ductal dilatation with patency of the hepaticojejunostomy anastomosis. 2. Technically successful internal/external biliary drain catheter placement as above.  EGD Findings:      The esophagus was normal. The stomach revealed evidence of prior       surgery. The efferent limb was normal was deeply intubated. The afferent       limb was acutely angulated and could not be intubated despite multiple       maneuvers with both standard upper endoscope and ultrathin colonoscope       over the course of 45 minutes. Impression:               1. Status post Whipple                           2. Angulation at the entrance of the afferent limb                            not permitting passage of the endoscope's despite                            extensive effort as described. Cannot rule out                            afferent loop syndrome   Antimicrobials:  Anti-infectives (From admission, onward)   Start     Dose/Rate Route Frequency Ordered Stop   12/02/17 2200  vancomycin (VANCOCIN) 500 mg in sodium chloride 0.9 % 100 mL IVPB  Status:  Discontinued     500 mg 100 mL/hr over 60 Minutes Intravenous Every 12 hours 12/02/17 1726 12/03/17 1857   12/02/17 2100  piperacillin-tazobactam (ZOSYN) IVPB 3.375 g     3.375 g 12.5 mL/hr over 240 Minutes Intravenous Every 8 hours 12/02/17 1724     12/02/17 1330  piperacillin-tazobactam (ZOSYN) IVPB 3.375 g  Status:   Discontinued     3.375 g 100 mL/hr over 30 Minutes Intravenous  Once 12/02/17 1323 12/02/17 1326   12/02/17 1330  vancomycin (VANCOCIN) IVPB 1000 mg/200 mL premix     1,000 mg 200 mL/hr over 60 Minutes Intravenous  Once 12/02/17 1323 12/02/17 1445     Subjective: Seen and examined and had no complaints but had some mild pain today. Had Drain in place with continued drainage. No SOB or CP. Did not eat anything empirically in case procedure was  going to be done today.   Objective: Vitals:   12/07/17 1349 12/07/17 2247 12/08/17 0545 12/08/17 1300  BP: 101/63 121/70 (!) 106/56 125/85  Pulse: (!) 58 68 62 (!) 117  Resp: '15 16 16 18  ' Temp: 98 F (36.7 C) 98.9 F (37.2 C) 98.3 F (36.8 C) 98.8 F (37.1 C)  TempSrc: Oral Oral Oral Oral  SpO2: 97% 99% 96% 90%  Weight:      Height:        Intake/Output Summary (Last 24 hours) at 12/08/2017 1352 Last data filed at 12/08/2017 1256 Gross per 24 hour  Intake 130 ml  Output 850 ml  Net -720 ml   Filed Weights   12/05/17 1259  Weight: 69.9 kg (154 lb)   Examination: Physical Exam:  Constitutional: WN/WD pleasant Caucasian female in NAD appears calm and comfortable  Eyes: Sclerae anicteric; Lids normal ENMT: External Ears and nose appear normal Neck: Supple with no JVD Respiratory: Diminished but unlabored; No wheezing/rales/rhonchi Cardiovascular: RRR; No appreciable m/r/g. No LE Edema Chest Wall: Has a Port-A-Cath in place on Left Side Abdomen: Soft, Tender to palpate mid abdomen; Bowel sounds present; Biliary Drain in place with good output GU: Deferred Musculoskeletal: No contratures; No cyanosis Skin: Warm and Dry; No appreciable rashes or lesions on a limited skin evaluation Neurologic: CN 2-12 grossly intact; No appreciable focal deficits Psychiatric: Pleasant mood and affect. Intact judgement and insight  Data Reviewed: I have personally reviewed following labs and imaging studies  CBC: Recent Labs  Lab 12/04/17 0353  12/05/17 0536 12/06/17 0334 12/07/17 0403 12/08/17 0500  WBC 5.7 5.9 6.4 5.5 5.9  NEUTROABS 3.5 4.0 4.5 2.8 3.6  HGB 8.4* 8.8* 9.2* 9.2* 10.2*  HCT 24.5* 26.2* 27.4* 27.3* 31.3*  MCV 88.8 87.9 88.4 88.3 89.7  PLT 282 272 335 359 211   Basic Metabolic Panel: Recent Labs  Lab 12/04/17 0353 12/05/17 0536 12/06/17 0334 12/07/17 0403 12/08/17 0500  NA 139 136 137 138 138  K 3.5 3.8 3.5 3.2* 3.5  CL 107 102 101 99* 101  CO2 '24 22 22 29 27  ' GLUCOSE 69 58* 82 107* 107*  BUN '6 7 7 ' <5* <5*  CREATININE 0.52 0.54 0.55 0.62 0.56  CALCIUM 8.3* 8.4* 8.4* 8.6* 9.0  MG 2.1 2.1 2.0 2.0 2.0  PHOS 2.7 3.4 3.3 3.3 2.9   GFR: Estimated Creatinine Clearance: 61.4 mL/min (by C-G formula based on SCr of 0.56 mg/dL). Liver Function Tests: Recent Labs  Lab 12/04/17 0353 12/05/17 0536 12/06/17 0334 12/07/17 0403 12/08/17 0500  AST 102* 104* 80* 75* 96*  ALT 50 51 46 42 48  ALKPHOS 564* 585* 609* 524* 485*  BILITOT 2.7* 3.6* 2.9* 2.2* 1.9*  PROT 5.7* 6.1* 6.2* 6.3* 6.9  ALBUMIN 2.5* 2.6* 2.6* 2.8* 3.0*   Recent Labs  Lab 12/02/17 1325  LIPASE 19   No results for input(s): AMMONIA in the last 168 hours. Coagulation Profile: Recent Labs  Lab 12/02/17 1928 12/04/17 0353  INR 1.70 1.47   Cardiac Enzymes: No results for input(s): CKTOTAL, CKMB, CKMBINDEX, TROPONINI in the last 168 hours. BNP (last 3 results) No results for input(s): PROBNP in the last 8760 hours. HbA1C: No results for input(s): HGBA1C in the last 72 hours. CBG: No results for input(s): GLUCAP in the last 168 hours. Lipid Profile: No results for input(s): CHOL, HDL, LDLCALC, TRIG, CHOLHDL, LDLDIRECT in the last 72 hours. Thyroid Function Tests: No results for input(s): TSH, T4TOTAL, FREET4, T3FREE, THYROIDAB in  the last 72 hours. Anemia Panel: No results for input(s): VITAMINB12, FOLATE, FERRITIN, TIBC, IRON, RETICCTPCT in the last 72 hours. Sepsis Labs: Recent Labs  Lab 12/02/17 1344 12/02/17 1928  12/02/17 2323  PROCALCITON  --  4.09  --   LATICACIDVEN 1.41 1.0 1.0    Recent Results (from the past 240 hour(s))  Urine Culture     Status: None   Collection Time: 12/02/17 10:04 AM  Result Value Ref Range Status   Specimen Description   Final    URINE, CLEAN CATCH Performed at [facility], [place] [address] [place], [place] [address]    Special Requests   Final    NONE Performed at [facility], [place] [address], [place], [place] [address]    Culture   Final    NO GROWTH Performed at [facility], [place] [address], [place], [place] [address]    Report Status 12/03/2017 FINAL  Final  Culture, Blood     Status: Abnormal   Collection Time: 12/02/17 11:39 AM  Result Value Ref Range Status   Specimen Description BLOOD RIGHT ARM  Final   Special Requests   Final    BOTTLES DRAWN AEROBIC AND ANAEROBIC Blood Culture results may not be optimal due to an excessive volume of blood received in culture bottles   Culture  Setup Time   Final    GRAM NEGATIVE RODS ANAEROBIC BOTTLE ONLY CRITICAL RESULT CALLED TO, READ BACK BY AND VERIFIED WITH: C SHADE PHARMD 1801 12/03/17 A BROWNING    Culture (A)  Final    BACTEROIDES FRAGILIS BETA LACTAMASE POSITIVE Performed at [facility], [place] [address], [place], [place] [address]    Report Status 12/06/2017 FINAL  Final  Blood Culture ID Panel (Reflexed)     Status: None   Collection Time: 12/02/17 11:39 AM  Result Value Ref Range Status   Enterococcus species NOT DETECTED NOT DETECTED Final   Listeria monocytogenes NOT DETECTED NOT DETECTED Final   Staphylococcus species NOT DETECTED NOT DETECTED Final   Staphylococcus aureus NOT DETECTED NOT DETECTED Final   Streptococcus species NOT DETECTED NOT DETECTED Final   Streptococcus agalactiae NOT DETECTED NOT DETECTED Final   Streptococcus pneumoniae NOT DETECTED NOT DETECTED Final   Streptococcus pyogenes NOT DETECTED NOT DETECTED Final    Acinetobacter baumannii NOT DETECTED NOT DETECTED Final   Enterobacteriaceae species NOT DETECTED NOT DETECTED Final   Enterobacter cloacae complex NOT DETECTED NOT DETECTED Final   Escherichia coli NOT DETECTED NOT DETECTED Final   Klebsiella oxytoca NOT DETECTED NOT DETECTED Final   Klebsiella pneumoniae NOT DETECTED NOT DETECTED Final   Proteus species NOT DETECTED NOT DETECTED Final   Serratia marcescens NOT DETECTED NOT DETECTED Final   Haemophilus influenzae NOT DETECTED NOT DETECTED Final   Neisseria meningitidis NOT DETECTED NOT DETECTED Final   Pseudomonas aeruginosa NOT DETECTED NOT DETECTED Final   Candida albicans NOT DETECTED NOT DETECTED Final   Candida glabrata NOT DETECTED NOT DETECTED Final   Candida krusei NOT DETECTED NOT DETECTED Final   Candida parapsilosis NOT DETECTED NOT DETECTED Final   Candida tropicalis NOT DETECTED NOT DETECTED Final    Comment: Performed at [facility], [place] [address], [place], [place] [address]  Culture, Blood     Status: None   Collection Time: 12/02/17 11:50 AM  Result Value Ref Range Status   Specimen Description BLOOD PORTA CATH  Final   Special Requests  Final    BOTTLES DRAWN AEROBIC AND ANAEROBIC Blood Culture adequate volume   Culture   Final    NO GROWTH 5 DAYS Performed at [facility], [place] [address], [place], [place] [address]    Report Status 12/07/2017 FINAL  Final  Blood Culture (routine x 2)     Status: None   Collection Time: 12/02/17  1:27 PM  Result Value Ref Range Status   Specimen Description   Final    BLOOD LEFT PORTA CATH Performed at [place] [address], [place], [place] [address]    Special Requests   Final    BOTTLES DRAWN AEROBIC AND ANAEROBIC Blood Culture adequate volume Performed at [place] [address], [place], [place] [address]    Culture   Final    NO GROWTH 5 DAYS Performed at [facility], [place] [address],  [place], [place] [address]    Report Status 12/07/2017 FINAL  Final  Respiratory Panel by PCR     Status: None   Collection Time: 12/02/17  3:40 PM  Result Value Ref Range Status   Adenovirus NOT DETECTED NOT DETECTED Final   Coronavirus 229E NOT DETECTED NOT DETECTED Final   Coronavirus HKU1 NOT DETECTED NOT DETECTED Final   Coronavirus NL63 NOT DETECTED NOT DETECTED Final   Coronavirus OC43 NOT DETECTED NOT DETECTED Final   Metapneumovirus NOT DETECTED NOT DETECTED Final   Rhinovirus / Enterovirus NOT DETECTED NOT DETECTED Final   Influenza A NOT DETECTED NOT DETECTED Final   Influenza B NOT DETECTED NOT DETECTED Final   Parainfluenza Virus 1 NOT DETECTED NOT DETECTED Final   Parainfluenza Virus 2 NOT DETECTED NOT DETECTED Final   Parainfluenza Virus 3 NOT DETECTED NOT DETECTED Final   Parainfluenza Virus 4 NOT DETECTED NOT DETECTED Final   Respiratory Syncytial Virus NOT DETECTED NOT DETECTED Final   Bordetella pertussis NOT DETECTED NOT DETECTED Final   Chlamydophila pneumoniae NOT DETECTED NOT DETECTED Final   Mycoplasma pneumoniae NOT DETECTED NOT DETECTED Final    Comment: Performed at [facility], [place] [address], [place], [place] [address]    Radiology Studies: No results found. Scheduled Meds: . amitriptyline  50 mg Oral QHS  . flecainide  100 mg Oral BID  . fluticasone  2 spray Each Nare Daily  . metoprolol succinate  12.5 mg Oral Daily  . potassium chloride  40 mEq Oral BID  . sodium chloride flush  5 mL Intracatheter Q8H   Continuous Infusions: . piperacillin-tazobactam (ZOSYN)  IV Stopped (12/08/17 1036)    LOS: 6 days   Kerney Elbe, DO [facility] Pager [phone]  If 7PM-7AM, please contact night-coverage [email or web address] Password [email or web address] 12/08/2017, 1:52 PM

## 2017-12-08 NOTE — Progress Notes (Signed)
Pharmacy Antibiotic Note  [name] is a 72 y.o. female admitted on 12/02/2017 with sepsis with c/o ongoing abdominal pain.  PMH significant for pancreatic cancer (s/p Whipple procedure), bile duct stent placement (2015), AFib, PE.  In the ED, patient received Vancomycin 1gm IV and Cefepime 2gm IV x 1 dose each.  Upon admission, Pharmacy has been consulted for Vancomcyin and Zosyn dosing.  Vancomycin has been subsequently stopped and pt is on Zosyn  Plan: Continue  Zosyn 3.375g IV q8h (4 hour infusion).   Temp (24hrs), Avg:98.4 F (36.9 C), Min:98 F (36.7 C), Max:98.9 F (37.2 C)  Recent Labs  Lab 12/02/17 1344 12/02/17 1928 12/02/17 2323  12/04/17 0353 12/05/17 0536 12/06/17 0334 12/07/17 0403 12/08/17 0500  WBC  --   --   --    < > 5.7 5.9 6.4 5.5 5.9  CREATININE  --   --   --    < > 0.52 0.54 0.55 0.62 0.56  LATICACIDVEN 1.41 1.0 1.0  --   --   --   --   --   --    < > = values in this interval not displayed.    Estimated Creatinine Clearance: 61.4 mL/min (by C-G formula based on SCr of 0.56 mg/dL).    Allergies  Allergen Reactions  . Ace Inhibitors     Cough  . Scopolamine Other (See Comments)    Dizzy, "lost control of my body", fell down and cracked a rib  . Sulfa Antibiotics Hives    Antimicrobials this admission: 3/12 Cefepime x 1 dose  3/12 Vanc >>  3/13 3/12 Zosyn >>  Dose adjustments this admission:    Microbiology results: 3/12 BCx: 1/3 Beta lactamase + B. fragilis 3/12 UCx: NGF  3/12 Resp Panel PCR: negative    Thank you for allowing pharmacy to be a part of this patient's care.   Royetta Asal, PharmD, BCPS Pager [phone] 12/08/2017 11:18 AM

## 2017-12-08 NOTE — Progress Notes (Signed)
Assumed care from previous RN. Agree with assessment. Will continue to monitor patient.

## 2017-12-09 ENCOUNTER — Inpatient Hospital Stay (HOSPITAL_COMMUNITY): Payer: Medicare Other

## 2017-12-09 ENCOUNTER — Encounter (HOSPITAL_COMMUNITY): Payer: Self-pay | Admitting: Interventional Radiology

## 2017-12-09 HISTORY — PX: IR ENDOLUMINAL BX OF BILIARY TREE: IMG6053

## 2017-12-09 HISTORY — PX: IR EXCHANGE BILIARY DRAIN: IMG6046

## 2017-12-09 LAB — CBC WITH DIFFERENTIAL/PLATELET
BASOS ABS: 0 10*3/uL (ref 0.0–0.1)
BASOS PCT: 0 %
EOS PCT: 5 %
Eosinophils Absolute: 0.4 10*3/uL (ref 0.0–0.7)
HCT: 34.6 % — ABNORMAL LOW (ref 36.0–46.0)
Hemoglobin: 11.4 g/dL — ABNORMAL LOW (ref 12.0–15.0)
Lymphocytes Relative: 15 %
Lymphs Abs: 1.4 10*3/uL (ref 0.7–4.0)
MCH: 29.6 pg (ref 26.0–34.0)
MCHC: 32.9 g/dL (ref 30.0–36.0)
MCV: 89.9 fL (ref 78.0–100.0)
Monocytes Absolute: 0.8 10*3/uL (ref 0.1–1.0)
Monocytes Relative: 8 %
NEUTROS ABS: 6.7 10*3/uL (ref 1.7–7.7)
Neutrophils Relative %: 72 %
PLATELETS: 448 10*3/uL — AB (ref 150–400)
RBC: 3.85 MIL/uL — AB (ref 3.87–5.11)
RDW: 14.9 % (ref 11.5–15.5)
WBC: 9.3 10*3/uL (ref 4.0–10.5)

## 2017-12-09 LAB — COMPREHENSIVE METABOLIC PANEL
ALBUMIN: 3.3 g/dL — AB (ref 3.5–5.0)
ALT: 60 U/L — AB (ref 14–54)
AST: 132 U/L — AB (ref 15–41)
Alkaline Phosphatase: 497 U/L — ABNORMAL HIGH (ref 38–126)
Anion gap: 9 (ref 5–15)
BUN: 6 mg/dL (ref 6–20)
CHLORIDE: 100 mmol/L — AB (ref 101–111)
CO2: 27 mmol/L (ref 22–32)
CREATININE: 0.68 mg/dL (ref 0.44–1.00)
Calcium: 9.4 mg/dL (ref 8.9–10.3)
GFR calc Af Amer: >60 mL/min (ref >60)
GLUCOSE: 114 mg/dL — AB (ref 65–99)
Potassium: 4.3 mmol/L (ref 3.5–5.1)
Sodium: 136 mmol/L (ref 135–145)
Total Bilirubin: 1.8 mg/dL — ABNORMAL HIGH (ref 0.3–1.2)
Total Protein: 7.7 g/dL (ref 6.5–8.1)

## 2017-12-09 LAB — PHOSPHORUS: Phosphorus: 3.6 mg/dL (ref 2.5–4.6)

## 2017-12-09 LAB — MAGNESIUM: MAGNESIUM: 2.1 mg/dL (ref 1.7–2.4)

## 2017-12-09 MED ORDER — LIDOCAINE HCL 1 % IJ SOLN
INTRAMUSCULAR | Status: AC
  Filled 2017-12-09: qty 20

## 2017-12-09 MED ORDER — HEPARIN SOD (PORK) LOCK FLUSH 100 UNIT/ML IV SOLN
500.0000 [IU] | INTRAVENOUS | Status: AC | PRN
  Administered 2017-12-09: 500 [IU]

## 2017-12-09 MED ORDER — LIDOCAINE HCL (PF) 1 % IJ SOLN
INTRAMUSCULAR | Status: AC | PRN
  Administered 2017-12-09: 10 mL

## 2017-12-09 MED ORDER — AMOXICILLIN-POT CLAVULANATE 875-125 MG PO TABS: 1.0000 | ORAL_TABLET | Freq: Two times a day (BID) | ORAL | 0 refills | Status: AC

## 2017-12-09 MED ORDER — IOPAMIDOL (ISOVUE-300) INJECTION 61%
50.0000 mL | Freq: Once | INTRAVENOUS | Status: AC | PRN
  Administered 2017-12-09: 20 mL

## 2017-12-09 MED ORDER — FENTANYL CITRATE (PF) 100 MCG/2ML IJ SOLN
INTRAMUSCULAR | Status: AC
  Filled 2017-12-09: qty 2

## 2017-12-09 MED ORDER — AMOXICILLIN-POT CLAVULANATE 875-125 MG PO TABS: 1.0000 | ORAL_TABLET | Freq: Two times a day (BID) | ORAL | Status: DC

## 2017-12-09 MED ORDER — RIVAROXABAN 20 MG PO TABS
20.0000 mg | ORAL_TABLET | Freq: Every day | ORAL | Status: DC
  Administered 2017-12-09: 20 mg via ORAL
  Filled 2017-12-09: qty 1

## 2017-12-09 MED ORDER — FENTANYL CITRATE (PF) 100 MCG/2ML IJ SOLN
INTRAMUSCULAR | Status: AC | PRN
  Administered 2017-12-09 (×2): 50 ug via INTRAVENOUS

## 2017-12-09 MED ORDER — MIDAZOLAM HCL 2 MG/2ML IJ SOLN
INTRAMUSCULAR | Status: AC | PRN
  Administered 2017-12-09 (×2): 1 mg via INTRAVENOUS

## 2017-12-09 MED ORDER — IOPAMIDOL (ISOVUE-300) INJECTION 61%
INTRAVENOUS | Status: AC
  Administered 2017-12-09: 20 mL
  Filled 2017-12-09: qty 50

## 2017-12-09 MED ORDER — MIDAZOLAM HCL 2 MG/2ML IJ SOLN
INTRAMUSCULAR | Status: AC
  Filled 2017-12-09: qty 4

## 2017-12-09 NOTE — Discharge Summary (Signed)
Physician Discharge Summary  [name] ZOX:[id number] DOB: [birth year] DOA: 12/02/2017  PCP: Chesley Noon, MD  Admit date: 12/02/2017 Discharge date: 12/09/2017  Admitted From: Home Disposition: Home  Recommendations for Outpatient Follow-up:  1. Follow up with PCP in 1-2 weeks 2. Follow up with Oncology Dr. [name] in 1 week 3. Follow up with General Surgery Dr. Barry Dienes in 1 week 4. Follow up with Gastroenterology Dr. [name] in 1 week 5. Follow up with IR Dr. Vernard Gambles and [facility] in 1 week 6. Please obtain CMP/CBC, Mag, Phos in one week 7. Please follow up on the following pending results: Biopsy Brushings x3 done with Cholangiogram 12/09/17  Home Health: No Equipment/Devices: None   Discharge Condition: Stable CODE STATUS: FULL CODE Diet recommendation: Soft Low Fat Diet   Brief/Interim Summary: HPI Dr. 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 Presented with abdominal pain ongoing for 2-3 weeks. It was7 out of 10 in intensity. Sharp pain in the upper abdomen without any radiation. No precipitating factors identified. No aggravating or relieving factors. She was seen at the cancer center. It appears that she is been having the symptoms since the end of February. At that time oncology was consider referring the patient to gastroenterology. Patient was diagnosed with sinusitis and was placed on oral antibiotics but she developed diarrhea. She saw her primary care physician yesterday and took a stool sample which was sent for C. difficile. Those results are not available yet. But she stopped taking her antibiotics and her diarrhea has resolved. Her  abdominal pain however continues. She has had some nausea but no vomiting. Denies any facial pain. Denies skin rashes shortness of breath cough. Denies any joint pains. Some pressure with urinating but denies any burning sensation.  Evaluation in the emergency department reveals that patient is febrile. Patient was noted to have borderline low blood pressures. 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 Patient will need hospitalization for further management. There is concern for sepsis.  *Patient under went EGD and PTC/External Drain afterwards and pain is improved. Still having significant biliary drainage. General Surgery following and to discuss with Dr. Barry Dienes about further steps in care as Bilirubin is improving. Patient underwentCholangiogram to asshe level of obstruction and brush biopsy this AM and had no complaints afterwards. Cholangiogram showed improvement in biliary dilatation following drainage. 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 The afferent loop remains dilated and contrast does not drain down stream within the small bowel. 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 Her biliary Drain was also exchanged. She ate and had no issues and was deemed medically stable to D/C home and follow up with PCP, Gastroenterology, General Surgery, Oncology, and IR as an outpatient. She will be discharged with drain and follow up with Dr. Barry Dienes in General Surgery for further evaluation and recommendations and follow up with Dr. Vernard Gambles and [facility] for further [facility].   Discharge Diagnoses:  Principal Problem:   Fever Active Problems:   Abdominal pain   Hyponatremia   Adenocarcinoma of head of pancreas (HCC)   Chronic anticoagulation  Anemia   Chronic atrial fibrillation (HCC)    Hypotension   Sepsis (HCC)   Afferent loop syndrome   Abnormal CT of the abdomen  Sepsis likely from Cholangitis with Bacteroides Fragiilis Bacteremia -Etiology unclear but likley GI/biliaryin origin.  -Blood Cx Showed GNR in Anaerobic Bottle Only but BCID showed none detected; Result showing Bacteroides Fragilis which is Beta Lactamase Positive -Considering her abnormal LFTs and findings and CT scan cholangitis and obstructive duct was considered; See Below -Follow-up on culture data.  -D/C'd Vancomycin given GNR and and was on Zosyn for now but changed to Augmentin 875/125 mg po BID x7 more days  -Lactic acid level is reassuringly normal and went from 1.41 -> 1.0 -Pro-calcitonin was 4.09. -Patient's has been afebrile and she has no White Count and WBC is 9.3 -IVF Rehydration with NS at 125 mL/hr now stopped  -Sepsis physiology improved  -Repeat CBC in AM   Abdominal Pain, improved  -CT scan showed Mildly progressive intrahepatic and extrahepatic ductal dilatation, as described above. Associated dilatation of the afferent limb. 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  -General Surgery has been consulted and recommended GI consult to eval the Afferent Limb and IR to see if they can decompress the biliary system with transhepatic drain -Gastroenterology consulted and MRCP done.See below.  -IR Consulted and Percutaneous Transhepatic Cholangiogram and Biliary Drain Placement after EGD; Was successful and patient's pain is improved and Drain is copious; Underwent Cholangiogram and Bursh Biopsy x3 today and then had drain exhanged -General Surgery following and allowed patient to eat diet; She was deemed medically stable to D/C home and follow up with Dr. Barry Dienes, Dr. [name], Dr. [name], and Dr. Vernard Gambles   Abnormal liver function tests including elevated alkaline phosphatase, bilirubin and transaminases -AST went from 120 -> 96 ->  132 -ALT went from 60 -> 48 -> 60 -As discussed above.  -MRCP showed Postoperative changes from Whipple procedure again noted. On today's study there is marked and progressive dilatation of the hepaticojejunostomy loop, bile ducts and main pancreatic duct. Focal area of narrowing of the hepaticojejunostomy loop as it crosses the midline from right to left. Findings may represent underlying postsurgical scarring and stricture formation versus recurrent Tumor. There was also a focal area of abnormal signal in the region of the caudate lobe of liver with increased enhancement. It is unclear whether not this is abnormal signal and enhancement within the liver parenchyma or adenopathy -Gastroenterology Consulted for further evaluation and treatment and recommending C/w IV Zosyn for now given Bacteroides Fragilis  -GI did EGD 12/05/17 and angulation at the entrance of the afferent limb was not permitting the passage of the endoscope despite effort and GI could not r/o Afferent Loop Syndrome.  -IR consulted and underwent Percutaneous Drainage of the Bile Duct 3/151/19; Last dose of Xarelto 12/01/17.  -She then underwent Cholangiogram and Brush Biopsy later this AM; Also had her Biliary Drain exchanged. -General Surgery deemed patient stable to D/C home as she tolerated diet and Dr. Barry Dienes to discuss about next steps in care as an outpatient  -Follow up with IR and [facility] -Follow up with Dr. [name] in Oncology -Follow up with Gastroenterology Dr. [name]  Hypotension -She is asymptomatic and BP is improving and BP was 123/79 this AM  -D/C'd IVF Rehydration .  Hyponatremia, improved -Improved. Was likely from Hypovolemia -Patient's Na+ went from 129 -> 136 -IVF now D/C"d  -Continue to Monitor  -Repeat CMP as  an outpatient   Normocytic Anemia -Likely Hemo-concentrated on Admission and likley dilutional Drop now  -Likely Anemia of chronic disease. No overt blood loss noted. -Hb/Hct now  stable at 11.4/34.6 -Continue to Monitor for S/Sx of Bleeding as she was previously Anticoagulated with Xarelto  -Repeat CBC as an outpatient   Recent Diarrhea, improved  -Probably due to antibiotics.  -She took a stool sample to her PCP prior to Admission.Those results are not available to [place] yet. But her diarrhea has resolved.  -We will continue to monitor for no and doubt it is C Difficile given no WBC as was 9.3.  History of Pancreatic Cancer -Followed by Oncology Dr. Julieanne Manson.  -Patient's CA 19-9 starting to elevate and went from 24 -> 316 -MRCP showed Postoperative changes from Whipple procedure again noted. On today's study there is marked and progressive dilatation of the hepaticojejunostomy loop, bile ducts and main pancreatic duct. Focal area of narrowing of the hepaticojejunostomy loop as it crosses the midline from right to left. Findings may represent underlying postsurgical scarring and stricture formation versus recurrent tumor. -Not on Chemotherapy currently.  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  -Pain control as before -General Surgery to Weigh in about next steps of care as an outpatient. -IR to get Brush Biopsy and Cholangiogram this AM -Patient will follow up with General Surgery, Oncology, Gastroenterology, and IR for further management and will have drain in place when she goes home. She will [facility] with [facility] in the coming weeks and may have it internalized pending further steps dictated by Dr. Barry Dienes.   History ofParoxysmalAtrial Fibrillation -She is on Flecainide which will be continued at 100 mg po BID.  -Will resume her Home Metoprolol XL 12.5 mg po Daily  -She is on Xarelto which was held; Will resume today  Hypokalemia -Patient's K+ was 3.2 and improved to 4.4 this AM  -Replete with po KCl 40 mEQ BID yesterday  -Continue to Monitor and Replete as Necessary -Repeat CMP as an outpatient    Hyperbilirubinemia -Improving. T Bili went from 4.6 -> 1.8 -As above -Repeat CMP as an outpatient   Discharge Instructions  Discharge Instructions    Call MD for:  difficulty breathing, headache or visual disturbances   Complete by:  As directed    Call MD for:  extreme fatigue   Complete by:  As directed    Call MD for:  hives   Complete by:  As directed    Call MD for:  persistant dizziness or light-headedness   Complete by:  As directed    Call MD for:  persistant nausea and vomiting   Complete by:  As directed    Call MD for:  redness, tenderness, or signs of infection (pain, swelling, redness, odor or green/yellow discharge around incision site)   Complete by:  As directed    Call MD for:  severe uncontrolled pain   Complete by:  As directed    Call MD for:  temperature >100.4   Complete by:  As directed    Diet - low sodium heart healthy   Complete by:  As directed    Discharge instructions   Complete by:  As directed    Follow up with PCP, Interventional Radiology, Oncology, Gastroenterology, and General Surgery as an outpatient. After speaking with Dr. Annamaria Boots, IR will arrange Drain follow up for you and re-evaluation. Will also need to follow up with Dr. [name], Dr. [name], and Dr. Barry Dienes. If symptoms  change or worsen please return to the ED for evaluation.   Increase activity slowly   Complete by:  As directed      Allergies as of 12/09/2017      Reactions   Ace Inhibitors    Cough   Scopolamine Other (See Comments)   Dizzy, "lost control of my body", fell down and cracked a rib   Sulfa Antibiotics Hives      Medication List    TAKE these medications   acetaminophen 500 MG tablet Commonly known as:  TYLENOL Take 1,000 mg by mouth every 6 (six) hours as needed for pain.   albuterol 108 (90 Base) MCG/ACT inhaler Commonly known as:  PROVENTIL HFA;VENTOLIN HFA Inhale 2 puffs into the lungs every 6 (six) hours as needed for wheezing or shortness of  breath.   amitriptyline 25 MG tablet Commonly known as:  ELAVIL Take 2 tablets (50 mg total) by mouth at bedtime.   amoxicillin-clavulanate 875-125 MG tablet Commonly known as:  AUGMENTIN Take 1 tablet by mouth every 12 (twelve) hours for 7 days.   diphenhydrAMINE 25 MG tablet Commonly known as:  BENADRYL Take 50 mg by mouth every 6 (six) hours as needed for itching or allergies.   diphenoxylate-atropine 2.5-0.025 MG tablet Commonly known as:  LOMOTIL Take 1-2 tablets by mouth 4 (four) times daily as needed for diarrhea or loose stools.   flecainide 100 MG tablet Commonly known as:  TAMBOCOR TAKE 1 TABLET BY MOUTH TWICE A DAY   fluticasone 50 MCG/ACT nasal spray Commonly known as:  FLONASE Place 1 spray into both nostrils 2 (two) times daily as needed for allergies.   HYDROcodone-acetaminophen 5-325 MG tablet Commonly known as:  NORCO/VICODIN Take 1-2 tablets by mouth every 4 (four) hours as needed for moderate pain.   lidocaine-prilocaine cream Commonly known as:  EMLA Apply small amount over port area 1-2 hours prior to treatment and cover with plastic wrap.  DO NOT RUB IN.   lipase/protease/amylase 12000 units Cpep capsule Commonly known as:  CREON Take 2 capsules (24,000 Units total) by mouth 3 (three) times daily before meals.   LORazepam 1 MG tablet Commonly known as:  ATIVAN Take 0.5 tablets (0.5 mg total) by mouth every 8 (eight) hours as needed for anxiety.   metoprolol succinate 25 MG 24 hr tablet Commonly known as:  TOPROL-XL Take 12.5 mg by mouth daily.   morphine 15 MG tablet Commonly known as:  MSIR Take 1 tablet (15 mg total) by mouth every 4 (four) hours as needed for severe pain.   MUCINEX 600 MG 12 hr tablet Generic drug:  guaiFENesin Take 600 mg by mouth every morning.   promethazine 12.5 MG tablet Commonly known as:  PHENERGAN Take 1 tablet (12.5 mg total) by mouth every 6 (six) hours as needed for nausea or vomiting.   XARELTO 20 MG Tabs  tablet Generic drug:  rivaroxaban TAKE 1 TABLET BY MOUTH EVERY DAY   zolpidem 6.25 MG CR tablet Commonly known as:  AMBIEN CR Take 1 tablet (6.25 mg total) by mouth at bedtime.      Follow-up Information    [name], MD Follow up.   Specialty:  General Surgery Why:  Office will call with follow up appointment. Contact information: [address] [place] [address] [phone]        Arne Cleveland, MD Follow up.   Specialties:  Interventional Radiology, Radiology Why:  Radiology team will call you regarding scheduling further  procedures or appointments once coordinated with the surgical team. May also call [phone] if questions or concerns about the drain(such as leaking, pain, bag issues, etc.) Contact information: [address] [place] [address] [phone]        Chesley Noon, MD. Call.   Specialty:  Family Medicine Why:  Follow up within 1 week  Contact information: [place] [address] [phone]        Milus Banister, MD. Call.   Specialty:  Gastroenterology Why:  Follow up within 1 week Contact information: [address] [address] [phone]        Ladell Pier, MD Follow up.   Specialty:  Oncology Why:  Follow up within 1 week  Contact information: [place] [address] [phone]          Allergies  Allergen Reactions  . Ace Inhibitors     Cough  . Scopolamine Other (See Comments)    Dizzy, "lost control of my body", fell down and cracked a rib  . Sulfa Antibiotics Hives   Consultations:  Gastroenterology  IR  General Surgery  Oncology  Procedures/Studies: Dg Chest 2 View  Result Date: 12/02/2017 CLINICAL DATA:  Flu like symptoms EXAM: CHEST - 2 VIEW COMPARISON:  Chest CT 01/25/2017 and CXR 01/25/2017 FINDINGS: The heart size and mediastinal contours are within normal limits. Chronic mild interstitial  prominence without alveolar consolidation, dominant mass, effusion or pneumothorax. Left-sided port catheter is noted with tip in the mid SVC. No acute osseous abnormality. Partially visualized left shoulder arthroplasty. IMPRESSION: No active cardiopulmonary disease. Electronically Signed   By: Ashley Royalty M.D.   On: 12/02/2017 17:16   Ct Abdomen Pelvis W Contrast  Result Date: 12/02/2017 CLINICAL DATA:  Acute abdominal pain. Hypotension. Fever/chills. Pancreatic cancer diagnosed 2015. Endometrial cancer diagnosed 2012. EXAM: CT ABDOMEN AND PELVIS WITH CONTRAST TECHNIQUE: Multidetector CT imaging of the abdomen and pelvis was performed using the standard protocol following bolus administration of intravenous contrast. CONTRAST:  158m ISOVUE-300 IOPAMIDOL (ISOVUE-300) INJECTION 61% COMPARISON:  11/18/2017. FINDINGS: Lower chest: Lung bases are clear. Hepatobiliary: No suspicious/enhancing hepatic lesions. Status post Whipple procedure with choledochojejunostomy. Mild intrahepatic and extrahepatic ductal dilatation. Dilated common duct, measuring 14 mm centrally (coronal image 44, previously 11 mm. Pancreas: Status post Whipple procedure with pancreaticojejunostomy. Atrophy of the residual pancreatic body/tail with mild pancreatic duct dilatation (series 2/image 24). Spleen: Within normal limits. Adrenals/Urinary Tract: Adrenal glands within normal limits. 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 Left kidney is within normal limits. No hydronephrosis. Bladder is within normal limits. Stomach/Bowel: Status post partial gastrectomy with gastrojejunostomy. Mildly dilated afferent limb in the right upper quadrant (series 2/image 29), although with improved wall thickening when compared to the prior CT. No evidence of bowel obstruction. Appendix is not discretely visualized. Vascular/Lymphatic: No evidence of abdominal aortic aneurysm. Atherosclerotic calcifications of the abdominal aorta and branch vessels. No suspicious  abdominopelvic lymphadenopathy. Reproductive: Status post hysterectomy. No adnexal masses. Other: No abdominopelvic ascites. Musculoskeletal: Mild superior endplate Schmorl's node deformities at T10, T11, and L5. IMPRESSION: Mildly progressive intrahepatic and extrahepatic ductal dilatation, as described above. Associated dilatation of the afferent limb. 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 GI/surgical consultation is suggested. No findings specific for recurrent or metastatic disease in this patient with history of pancreatic cancer and endometrial cancer. Electronically Signed   By: SJulian HyM.D.   On: 12/02/2017 15:13  Ct Abdomen Pelvis W Contrast  Result Date: 11/18/2017 CLINICAL DATA:  Restaging pancreatic cancer. History of Whipple procedure. EXAM: CT ABDOMEN AND PELVIS WITH CONTRAST TECHNIQUE: Multidetector CT imaging of the abdomen and pelvis was performed using the standard protocol following bolus administration of intravenous contrast. CONTRAST:  132m ISOVUE-300 IOPAMIDOL (ISOVUE-300) INJECTION 61% COMPARISON:  PET-CT 07/19/2017 and abdominal CT scan 06/19/2017 FINDINGS: Lower chest: The lung bases are clear of acute process. No pleural effusion or pulmonary lesions. The heart is normal in size. No pericardial effusion. The distal esophagus and aorta are unremarkable. Hepatobiliary: Diffuse fatty infiltration of the liver but no focal hepatic lesions to suggest metastatic disease. Slight progression intrahepatic biliary dilatation. There is also dilatation of the blind-ending jejunal loop of small bowel with slightly prominent mucosal folds. This could be contributing to the intrahepatic biliary dilatation. 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 Pancreas: Surgical changes from partial pancreatectomy and Whipple procedure. The remaining pancreatic body and tail are markedly atrophied and the duct is mildly dilated. The duct may be  slightly more dilated than on the prior CT Spleen: Normal size.  No focal lesions. Adrenals/Urinary Tract: The adrenal glands and kidneys are unremarkable. The bladder is grossly normal. Stomach/Bowel: Surgical changes from a Whipple procedure. Gastrojejunostomy appears unremarkable. Again demonstrated is a dilated afferent loop and some wall thickening near the anastomoses. The bowel loop narrow significantly as it crosses the midline. Could not exclude afferent loop syndrome. The remaining small bowel and colon are unremarkable except for moderate to large amount of stool throughout the colon which suggests constipation. Vascular/Lymphatic: The aorta branch vessels are patent. The branch vessels are patent. The major venous structures are patent. No mesenteric or retroperitoneal mass or adenopathy. Small scattered lymph nodes are unchanged. Reproductive: Stable fullness in the left vaginal cuff area but this was not hot on the PET scan and is likely normal postoperative change. Other: No pelvic mass or adenopathy. No free pelvic fluid collections. No inguinal mass or adenopathy. Musculoskeletal: No significant bony findings. IMPRESSION: 1. Surgical changes from a partial pancreatectomy and Whipple procedure. No findings suspicious for recurrent tumor or metastatic disease. 2. Dilated blind-ending afferent loop along with intrahepatic biliary dilatation and slightly progressive pancreatic ductal dilatation. Could not exclude afferent loop syndrome or possible partial obstruction of the jejunal loop. 3. Moderate to large amount of stool throughout the colon suggesting constipation. Electronically Signed   By: PMarijo SanesM.D.   On: 11/18/2017 14:46   Mr 3d Recon At Scanner  Result Date: 12/03/2017 CLINICAL DATA:  Evaluate for pancreatitis. History of pancreas cancer. Now with ongoing abdominal pain. Progressive biliary dilatation. EXAM: MRI ABDOMEN WITHOUT AND WITH CONTRAST (INCLUDING MRCP) TECHNIQUE:  Multiplanar multisequence MR imaging of the abdomen was performed both before and after the administration of intravenous contrast. Heavily T2-weighted images of the biliary and pancreatic ducts were obtained, and three-dimensional MRCP images were rendered by post processing. CONTRAST:  218mMULTIHANCE GADOBENATE DIMEGLUMINE 529 MG/ML IV SOLN COMPARISON:  12/02/2017 FINDINGS: Lower chest: No acute findings. Hepatobiliary: Diffusely heterogeneous enhancement pattern of the liver is identified. There is a focal area of increased T2 signal within the region of the caudate lobe of liver which measures 2.4 by 2.5 cm, image 23/3. This demonstrates relative increased enhancement compared with the remainder of the liver, image 48/1301. Within the remaining portions of the liver there is heterogeneous attenuation and enhancement. There is marked intrahepatic bile duct dilatation. This demonstrates significant progression when compared with 11/18/17. Pancreas: Residual pancreas is largely  atrophic with dilatation of the main duct, image 59/1303. The main duct measures up to 7 mm. Spleen:  Within normal limits in size and appearance. Adrenals/Urinary Tract: The adrenal glands are normal. Unremarkable appearance of the kidneys. Stomach/Bowel: The stomach and the remaining small bowel loops are unremarkable. Dilatation of the hepaticojejunostomy loop is identified. There appears to be marked narrowing of the hepaticojejunostomy loop at the point at which the crosses the midline (from right to left) between the aorta and SMV, image 72/1303. Findings may reflect underlying postsurgical scarring. Recurrent tumor not excluded. Vascular/Lymphatic:  Normal appearance of the abdominal aorta. Other:  No ascites or focal fluid collections identified. Musculoskeletal: No suspicious bone lesions identified. IMPRESSION: 1. Postoperative changes from Whipple procedure again noted. On today's study there is marked and progressive dilatation  of the hepaticojejunostomy loop, bile ducts and main pancreatic duct. Focal area of narrowing of the hepaticojejunostomy loop as it crosses the midline from right to left. Findings may represent underlying postsurgical scarring and stricture formation versus recurrent tumor. 2. There is a focal area of abnormal signal in the region of the caudate lobe of liver with increased enhancement. It is unclear whether not this is abnormal signal and enhancement within the liver parenchyma or adenopathy. A PET-CT may be helpful to assess for recurrent metabolically active tumor. Electronically Signed   By: Kerby Moors M.D.   On: 12/03/2017 08:37   Mr Abdomen Mrcp Moise Boring Contast  Result Date: 12/03/2017 CLINICAL DATA:  Evaluate for pancreatitis. History of pancreas cancer. Now with ongoing abdominal pain. Progressive biliary dilatation. EXAM: MRI ABDOMEN WITHOUT AND WITH CONTRAST (INCLUDING MRCP) TECHNIQUE: Multiplanar multisequence MR imaging of the abdomen was performed both before and after the administration of intravenous contrast. Heavily T2-weighted images of the biliary and pancreatic ducts were obtained, and three-dimensional MRCP images were rendered by post processing. CONTRAST:  55m MULTIHANCE GADOBENATE DIMEGLUMINE 529 MG/ML IV SOLN COMPARISON:  12/02/2017 FINDINGS: Lower chest: No acute findings. Hepatobiliary: Diffusely heterogeneous enhancement pattern of the liver is identified. There is a focal area of increased T2 signal within the region of the caudate lobe of liver which measures 2.4 by 2.5 cm, image 23/3. This demonstrates relative increased enhancement compared with the remainder of the liver, image 48/1301. Within the remaining portions of the liver there is heterogeneous attenuation and enhancement. There is marked intrahepatic bile duct dilatation. This demonstrates significant progression when compared with 11/18/17. Pancreas: Residual pancreas is largely atrophic with dilatation of the main  duct, image 59/1303. The main duct measures up to 7 mm. Spleen:  Within normal limits in size and appearance. Adrenals/Urinary Tract: The adrenal glands are normal. Unremarkable appearance of the kidneys. Stomach/Bowel: The stomach and the remaining small bowel loops are unremarkable. Dilatation of the hepaticojejunostomy loop is identified. There appears to be marked narrowing of the hepaticojejunostomy loop at the point at which the crosses the midline (from right to left) between the aorta and SMV, image 72/1303. Findings may reflect underlying postsurgical scarring. Recurrent tumor not excluded. Vascular/Lymphatic:  Normal appearance of the abdominal aorta. Other:  No ascites or focal fluid collections identified. Musculoskeletal: No suspicious bone lesions identified. IMPRESSION: 1. Postoperative changes from Whipple procedure again noted. On today's study there is marked and progressive dilatation of the hepaticojejunostomy loop, bile ducts and main pancreatic duct. Focal area of narrowing of the hepaticojejunostomy loop as it crosses the midline from right to left. Findings may represent underlying postsurgical scarring and stricture formation versus recurrent tumor. 2. There  is a focal area of abnormal signal in the region of the caudate lobe of liver with increased enhancement. It is unclear whether not this is abnormal signal and enhancement within the liver parenchyma or adenopathy. A PET-CT may be helpful to assess for recurrent metabolically active tumor. Electronically Signed   By: Kerby Moors M.D.   On: 12/03/2017 08:37   Ir Percutaneous Transhepatic Cholangiogram  Result Date: 12/05/2017 INDICATION: Pancreatic carcinoma status post Whipple, now with epigastric pain and dilatation of the hepaticojejunostomy loop and bile ducts. Elevated bilirubin. Decompression is requested. EXAM: PERCUTANEOUS TRANSHEPATIC CHOLANGIOGRAM PERCUTANEOUS INTERNAL/EXTERNAL BILIARY DRAIN MEDICATIONS: Patient was  already receiving adequate prophylactic antibiotic coverage. ANESTHESIA/SEDATION: Intravenous Fentanyl and Versed were administered as conscious sedation during continuous monitoring of the patient's level of consciousness and physiological / cardiorespiratory status by the radiology RN, with a total moderate sedation time of 23 minutes. PROCEDURE: Informed written consent was obtained from the patient after a thorough discussion of the procedural risks, benefits and alternatives. All questions were addressed. Maximal Sterile Barrier Technique was utilized including caps, mask, sterile gowns, sterile gloves, sterile drape, hand hygiene and skin antiseptic. A timeout was performed prior to the initiation of the procedure. Limited ultrasound confirmed intrahepatic biliary ductal dilatation in the left lobe. An appropriate skin entry site was determined. Under real-time ultrasound guidance, a 21 gauge needle advanced into a dilated peripheral left lateral segment duct. Bowel spontaneously returned through the needle hub. A 018 guidewire advanced centrally easily. A 6 French transitional dilator was advanced over the wire. Contrast injection for percutaneous transhepatic cholangiogram was performed. This confirmed placement within the central intrahepatic biliary tree. There is intra and extrahepatic biliary ductal dilatation. Contrast did flow across the anastomosis at the hepaticojejunostomy, with dilatation of the bowel. The dilator was exchanged over a Bentson wire for a 5 [place] Kumpe catheter, advanced into the jejunal loop. Catheter exchanged over an Amplatz wire for vascular dilator which facilitated advancement of a 10 [place] internal external biliary drain catheter, placed with sideholes spanning the intrahepatic biliary tree, extending into the anastomosed jejunal loop. Contrast injection confirmed good position and patency. The catheter was secured externally with 0 Prolene suture and StatLock and placed to  external drainage. The patient tolerated the procedure well. FLUOROSCOPY TIME:  2 minutes 54 seconds; 47 mGy COMPLICATIONS: None immediate IMPRESSION: 1. Percutaneous transhepatic cholangiogram demonstrates intra and extrahepatic biliary ductal dilatation with patency of the hepaticojejunostomy anastomosis. 2. Technically successful internal/external biliary drain catheter placement as above. PLAN: Allow biliary decompression with internal /external drainage. Follow bilirubin. We can do a follow-up cholangiogram next week to assess the level of obstruction, possible brush biopsy. Electronically Signed   By: Lucrezia Europe M.D.   On: 12/05/2017 17:36   Ir Exchange Biliary Drain  Result Date: 12/09/2017 INDICATION: Status post Whipple procedure, biliary obstruction EXAM: CHOLANGIOGRAM THROUGH EXISTING CATHETER TRANSCATHETER BREAST BIOPSY OF THE BILE DUCT ANASTOMOSIS REPLACEMENT OF THE LEFT 10 FRENCH INTERNAL EXTERNAL BILIARY DRAIN MEDICATIONS: Patient is inpatient.  IV antibiotics are currently infusion. ANESTHESIA/SEDATION: Moderate (conscious) sedation was employed during this procedure. A total of Versed 2.0 mg and Fentanyl 100 mcg was administered intravenously. Moderate Sedation Time: 30 minutes. The patient's level of consciousness and vital signs were monitored continuously by radiology nursing throughout the procedure under my direct supervision. FLUOROSCOPY TIME:  Fluoroscopy Time: 10 minutes 54 seconds (564 mGy). COMPLICATIONS: None immediate. PROCEDURE: Informed written consent was obtained from the patient after a thorough discussion of the procedural risks, benefits and alternatives.  All questions were addressed. Maximal Sterile Barrier Technique was utilized including caps, mask, sterile gowns, sterile gloves, sterile drape, hand hygiene and skin antiseptic. A timeout was performed prior to the initiation of the procedure. Under sterile conditions and local anesthesia, the existing left internal external  biliary drain was injected with contrast for cholangiogram. Cholangiogram: This demonstrates improvement in the diffuse biliary dilatation extending to the bile duct anastomosis to the afferent loop. Catheter was cut and removed. 8 French sheath advanced over Amplatz guidewire. Bile duct anastomosis brushings: Alongside the Amplatz guidewire, bile duct brush biopsy performed 3 times through the 8 French sheath. Images obtained for documentation. Sample sent for cytology. The Biliary drain replacement: Ten [place] internal external biliary drain replaced over the Amplatz guidewire. Retention loop formed in the afferent loop across the bile duct anastomosis. Ducts were decompressed by syringe aspiration. Images obtained for documentation. Catheter secured with a Prolene suture. Patient tolerated the procedure well. No immediate complication. IMPRESSION: Initial cholangiogram demonstrates improvement in biliary dilatation following drainage. 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 The afferent loop remains dilated and contrast does not drain down stream within the small bowel. 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 Successful bile duct anastomosis brush biopsy for cytology Replacement of the left internal external 10 [place] biliary drain. Electronically Signed   By: Jerilynn Mages.  Shick M.D.   On: 12/09/2017 13:49   Ir Endoluminal Bx Of Biliary Tree  Result Date: 12/09/2017 INDICATION: Status post Whipple procedure, biliary obstruction EXAM: CHOLANGIOGRAM THROUGH EXISTING CATHETER TRANSCATHETER BREAST BIOPSY OF THE BILE DUCT ANASTOMOSIS REPLACEMENT OF THE LEFT 10 FRENCH INTERNAL EXTERNAL BILIARY DRAIN MEDICATIONS: Patient is inpatient.  IV antibiotics are currently infusion. ANESTHESIA/SEDATION: Moderate (conscious) sedation was employed during this procedure. A total of  Versed 2.0 mg and Fentanyl 100 mcg was administered intravenously. Moderate Sedation Time: 30 minutes. The patient's level of consciousness and vital signs were monitored continuously by radiology nursing throughout the procedure under my direct supervision. FLUOROSCOPY TIME:  Fluoroscopy Time: 10 minutes 54 seconds (564 mGy). COMPLICATIONS: None immediate. PROCEDURE: Informed written consent was obtained from the patient after a thorough discussion of the procedural risks, benefits and alternatives. All questions were addressed. Maximal Sterile Barrier Technique was utilized including caps, mask, sterile gowns, sterile gloves, sterile drape, hand hygiene and skin antiseptic. A timeout was performed prior to the initiation of the procedure. Under sterile conditions and local anesthesia, the existing left internal external biliary drain was injected with contrast for cholangiogram. Cholangiogram: This demonstrates improvement in the diffuse biliary dilatation extending to the bile duct anastomosis to the afferent loop. Catheter was cut and removed. 8 French sheath advanced over Amplatz guidewire. Bile duct anastomosis brushings: Alongside the Amplatz guidewire, bile duct brush biopsy performed 3 times through the 8 French sheath. Images obtained for documentation. Sample sent for cytology. The Biliary drain replacement: Ten [place] internal external biliary drain replaced over the Amplatz guidewire. Retention loop formed in the afferent loop across the bile duct anastomosis. Ducts were decompressed by syringe aspiration. Images obtained for documentation. Catheter secured with a Prolene suture. Patient tolerated the procedure well. No immediate complication. IMPRESSION: Initial cholangiogram demonstrates improvement in biliary dilatation following drainage. There is a mild bile duct anastomosis stricture noted during the cholangiogram, contrast does pass through the bile duct anastomosis around the biliary drain into  the afferent loop. The afferent loop remains  dilated and contrast does not drain down stream within the small bowel. 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 Successful bile duct anastomosis brush biopsy for cytology Replacement of the left internal external 10 [place] biliary drain. Electronically Signed   By: Jerilynn Mages.  Shick M.D.   On: 12/09/2017 13:49   US Abdomen Limited Ruq  Result Date: 12/02/2017 CLINICAL DATA:  Abdominal pain.  History of pancreatic carcinoma EXAM: ULTRASOUND ABDOMEN LIMITED RIGHT UPPER QUADRANT COMPARISON:  CT abdomen and pelvis November 18, 2017 FINDINGS: Gallbladder: Surgically absent. Common bile duct: Diameter: 11 mm, prominent. No biliary duct mass or calculus is appreciable by ultrasound. Liver: No focal lesion identified. Liver echogenicity overall is increased. There is intrahepatic biliary duct dilatation. Portal vein is patent on color Doppler imaging with normal direction of blood flow towards the liver. IMPRESSION: 1. Gallbladder absent. There is biliary duct dilatation, similar to recent CT. No biliary duct mass or calculus evident. 2. Diffuse increase in liver echogenicity, a finding felt to be indicative of hepatic steatosis. While no focal liver lesions are evident, it must be cautioned that the sensitivity of ultrasound for detection of focal liver lesions is diminished significantly in this circumstance. Electronically Signed   By: Lowella Grip III M.D.   On: 12/02/2017 14:19     Subjective: Seen and examined after Cholangiogram and was sleepy. Improved and had no abdominal pain. Ready to go home and tolerated Diet well.  Discharge Exam: Vitals:   12/09/17 1325 12/09/17 1342  BP:  120/74  Pulse:  72  Resp:    Temp:  98.2 F (36.8 C)  SpO2: 99% 99%   Vitals:   12/09/17 0846 12/09/17 0855 12/09/17 1325 12/09/17 1342  BP: 122/75 103/74  120/74  Pulse: 61 69  72  Resp: 20 20    Temp:    98.2 F (36.8 C)  TempSrc:    Oral  SpO2: 97% (!) 80%  99% 99%  Weight:      Height:       General: Pt is alert, awake, not in acute distress Cardiovascular: RRR, S1/S2 +, no rubs, no gallops Respiratory: CTA bilaterally, no wheezing, no rhonchi Abdominal: Soft, NT, ND, bowel sounds +; Has biliary Drain in place Extremities: no edema, no cyanosis  The results of significant diagnostics from this hospitalization (including imaging, microbiology, ancillary and laboratory) are listed below for reference.    Microbiology: Recent Results (from the past 240 hour(s))  Urine Culture     Status: None   Collection Time: 12/02/17 10:04 AM  Result Value Ref Range Status   Specimen Description   Final    URINE, CLEAN CATCH Performed at [facility], [address], [place], [place] [address]    Special Requests   Final    NONE Performed at [facility], [place] [address], [place], [place] [address]    Culture   Final    NO GROWTH Performed at [facility], [place] [address], [place], [place] [address]    Report Status 12/03/2017 FINAL  Final  Culture, Blood     Status: Abnormal   Collection Time: 12/02/17 11:39 AM  Result Value Ref Range Status   Specimen Description BLOOD RIGHT ARM  Final   Special Requests   Final    BOTTLES DRAWN AEROBIC AND ANAEROBIC Blood Culture results may not be optimal due to an excessive volume of blood received in culture bottles   Culture  Setup Time  Final    GRAM NEGATIVE RODS ANAEROBIC BOTTLE ONLY CRITICAL RESULT CALLED TO, READ BACK BY AND VERIFIED WITH: C SHADE PHARMD 1801 12/03/17 A BROWNING    Culture (A)  Final    BACTEROIDES FRAGILIS BETA LACTAMASE POSITIVE Performed at [facility], [place] [address], [place], [place] [address]    Report Status 12/06/2017 FINAL  Final  Blood Culture ID Panel (Reflexed)     Status: None   Collection Time: 12/02/17 11:39 AM  Result Value Ref Range Status   Enterococcus species NOT DETECTED NOT DETECTED Final    Listeria monocytogenes NOT DETECTED NOT DETECTED Final   Staphylococcus species NOT DETECTED NOT DETECTED Final   Staphylococcus aureus NOT DETECTED NOT DETECTED Final   Streptococcus species NOT DETECTED NOT DETECTED Final   Streptococcus agalactiae NOT DETECTED NOT DETECTED Final   Streptococcus pneumoniae NOT DETECTED NOT DETECTED Final   Streptococcus pyogenes NOT DETECTED NOT DETECTED Final   Acinetobacter baumannii NOT DETECTED NOT DETECTED Final   Enterobacteriaceae species NOT DETECTED NOT DETECTED Final   Enterobacter cloacae complex NOT DETECTED NOT DETECTED Final   Escherichia coli NOT DETECTED NOT DETECTED Final   Klebsiella oxytoca NOT DETECTED NOT DETECTED Final   Klebsiella pneumoniae NOT DETECTED NOT DETECTED Final   Proteus species NOT DETECTED NOT DETECTED Final   Serratia marcescens NOT DETECTED NOT DETECTED Final   Haemophilus influenzae NOT DETECTED NOT DETECTED Final   Neisseria meningitidis NOT DETECTED NOT DETECTED Final   Pseudomonas aeruginosa NOT DETECTED NOT DETECTED Final   Candida albicans NOT DETECTED NOT DETECTED Final   Candida glabrata NOT DETECTED NOT DETECTED Final   Candida krusei NOT DETECTED NOT DETECTED Final   Candida parapsilosis NOT DETECTED NOT DETECTED Final   Candida tropicalis NOT DETECTED NOT DETECTED Final    Comment: Performed at [facility], [place] [address], [place], [place] [address]  Culture, Blood     Status: None   Collection Time: 12/02/17 11:50 AM  Result Value Ref Range Status   Specimen Description BLOOD PORTA CATH  Final   Special Requests   Final    BOTTLES DRAWN AEROBIC AND ANAEROBIC Blood Culture adequate volume   Culture   Final    NO GROWTH 5 DAYS Performed at [facility], [address] [place], [place] [address]    Report Status 12/07/2017 FINAL  Final  Blood Culture (routine x 2)     Status: None   Collection Time: 12/02/17  1:27 PM  Result Value Ref Range Status   Specimen Description   Final     BLOOD LEFT PORTA CATH Performed at [facility] [place] [address] [place], [place] [address]    Special Requests   Final    BOTTLES DRAWN AEROBIC AND ANAEROBIC Blood Culture adequate volume Performed at [place] [address], [place], [place] [address]    Culture   Final    NO GROWTH 5 DAYS Performed at [facility], [place] [address], [place], [place] [address]    Report Status 12/07/2017 FINAL  Final  Respiratory Panel by PCR     Status: None   Collection Time: 12/02/17  3:40 PM  Result Value Ref Range Status   Adenovirus NOT DETECTED NOT DETECTED Final   Coronavirus 229E NOT DETECTED NOT DETECTED Final   Coronavirus HKU1 NOT DETECTED NOT DETECTED Final   Coronavirus NL63 NOT DETECTED NOT DETECTED Final   Coronavirus OC43 NOT DETECTED NOT DETECTED  Final   Metapneumovirus NOT DETECTED NOT DETECTED Final   Rhinovirus / Enterovirus NOT DETECTED NOT DETECTED Final   Influenza A NOT DETECTED NOT DETECTED Final   Influenza B NOT DETECTED NOT DETECTED Final   Parainfluenza Virus 1 NOT DETECTED NOT DETECTED Final   Parainfluenza Virus 2 NOT DETECTED NOT DETECTED Final   Parainfluenza Virus 3 NOT DETECTED NOT DETECTED Final   Parainfluenza Virus 4 NOT DETECTED NOT DETECTED Final   Respiratory Syncytial Virus NOT DETECTED NOT DETECTED Final   Bordetella pertussis NOT DETECTED NOT DETECTED Final   Chlamydophila pneumoniae NOT DETECTED NOT DETECTED Final   Mycoplasma pneumoniae NOT DETECTED NOT DETECTED Final    Comment: Performed at [facility], [place] [address] [place], [place] [address]    Labs: BNP (last 3 results) Recent Labs    01/25/17 2139  BNP 062.3*   Basic Metabolic Panel: Recent Labs  Lab 12/05/17 0536 12/06/17 0334 12/07/17 0403 12/08/17 0500 12/09/17 0720  NA 136 137 138 138 136  K 3.8 3.5 3.2* 3.5 4.3  CL 102 101 99* 101 100*  CO2 _0 GLUCOSE 58* 82 107* 107* 114*  BUN 7 7 <5* <5* 6   CREATININE 0.54 0.55 0.62 0.56 0.68  CALCIUM 8.4* 8.4* 8.6* 9.0 9.4  MG 2.1 2.0 2.0 2.0 2.1  PHOS 3.4 3.3 3.3 2.9 3.6   Liver Function Tests: Recent Labs  Lab 12/05/17 0536 12/06/17 0334 12/07/17 0403 12/08/17 0500 12/09/17 0720  AST 104* 80* 75* 96* 132*  ALT 51 46 42 48 60*  ALKPHOS 585* 609* 524* 485* 497*  BILITOT 3.6* 2.9* 2.2* 1.9* 1.8*  PROT 6.1* 6.2* 6.3* 6.9 7.7  ALBUMIN 2.6* 2.6* 2.8* 3.0* 3.3*   No results for input(s): LIPASE, AMYLASE in the last 168 hours. No results for input(s): AMMONIA in the last 168 hours. CBC: Recent Labs  Lab 12/05/17 0536 12/06/17 0334 12/07/17 0403 12/08/17 0500 12/09/17 0720  WBC 5.9 6.4 5.5 5.9 9.3  NEUTROABS 4.0 4.5 2.8 3.6 6.7  HGB 8.8* 9.2* 9.2* 10.2* 11.4*  HCT 26.2* 27.4* 27.3* 31.3* 34.6*  MCV 87.9 88.4 88.3 89.7 89.9  PLT 272 335 359 397 448*   Cardiac Enzymes: No results for input(s): CKTOTAL, CKMB, CKMBINDEX, TROPONINI in the last 168 hours. BNP: Invalid input(s): POCBNP CBG: No results for input(s): GLUCAP in the last 168 hours. D-Dimer No results for input(s): DDIMER in the last 72 hours. Hgb A1c No results for input(s): HGBA1C in the last 72 hours. Lipid Profile No results for input(s): CHOL, HDL, LDLCALC, TRIG, CHOLHDL, LDLDIRECT in the last 72 hours. Thyroid function studies No results for input(s): TSH, T4TOTAL, T3FREE, THYROIDAB in the last 72 hours.  Invalid input(s): FREET3 Anemia work up No results for input(s): VITAMINB12, FOLATE, FERRITIN, TIBC, IRON, RETICCTPCT in the last 72 hours. Urinalysis    Component Value Date/Time   COLORURINE AMBER (A) 12/02/2017 1327   APPEARANCEUR CLEAR 12/02/2017 1327   LABSPEC 1.010 12/02/2017 1327   LABSPEC 1.005 04/21/2014 1713   PHURINE 6.0 12/02/2017 1327   GLUCOSEU NEGATIVE 12/02/2017 1327   GLUCOSEU Negative 04/21/2014 1713   HGBUR NEGATIVE 12/02/2017 1327   BILIRUBINUR NEGATIVE 12/02/2017 1327   BILIRUBINUR Negative 04/21/2014 1713   KETONESUR  NEGATIVE 12/02/2017 1327   PROTEINUR NEGATIVE 12/02/2017 1327   UROBILINOGEN 0.2 10/16/2014 1035   UROBILINOGEN 0.2 04/21/2014 1713   NITRITE NEGATIVE 12/02/2017 1327   LEUKOCYTESUR NEGATIVE 12/02/2017 1327   LEUKOCYTESUR Trace 04/21/2014  1713   Sepsis Labs Invalid input(s): PROCALCITONIN,  WBC,  LACTICIDVEN Microbiology Recent Results (from the past 240 hour(s))  Urine Culture     Status: None   Collection Time: 12/02/17 10:04 AM  Result Value Ref Range Status   Specimen Description   Final    URINE, CLEAN CATCH Performed at [facility], [place] [address] [place], [place] [address]    Special Requests   Final    NONE Performed at [facility], [place] [address] [place], [place] [address]    Culture   Final    NO GROWTH Performed at [facility], [place] [address], [place], [place] [address]    Report Status 12/03/2017 FINAL  Final  Culture, Blood     Status: Abnormal   Collection Time: 12/02/17 11:39 AM  Result Value Ref Range Status   Specimen Description BLOOD RIGHT ARM  Final   Special Requests   Final    BOTTLES DRAWN AEROBIC AND ANAEROBIC Blood Culture results may not be optimal due to an excessive volume of blood received in culture bottles   Culture  Setup Time   Final    GRAM NEGATIVE RODS ANAEROBIC BOTTLE ONLY CRITICAL RESULT CALLED TO, READ BACK BY AND VERIFIED WITH: C SHADE PHARMD 1801 12/03/17 A BROWNING    Culture (A)  Final    BACTEROIDES FRAGILIS BETA LACTAMASE POSITIVE Performed at [facility], [place] [address], [place], [place] [address]    Report Status 12/06/2017 FINAL  Final  Blood Culture ID Panel (Reflexed)     Status: None   Collection Time: 12/02/17 11:39 AM  Result Value Ref Range Status   Enterococcus species NOT DETECTED NOT DETECTED Final   Listeria monocytogenes NOT DETECTED NOT DETECTED Final   Staphylococcus species NOT DETECTED NOT DETECTED Final   Staphylococcus aureus NOT  DETECTED NOT DETECTED Final   Streptococcus species NOT DETECTED NOT DETECTED Final   Streptococcus agalactiae NOT DETECTED NOT DETECTED Final   Streptococcus pneumoniae NOT DETECTED NOT DETECTED Final   Streptococcus pyogenes NOT DETECTED NOT DETECTED Final   Acinetobacter baumannii NOT DETECTED NOT DETECTED Final   Enterobacteriaceae species NOT DETECTED NOT DETECTED Final   Enterobacter cloacae complex NOT DETECTED NOT DETECTED Final   Escherichia coli NOT DETECTED NOT DETECTED Final   Klebsiella oxytoca NOT DETECTED NOT DETECTED Final   Klebsiella pneumoniae NOT DETECTED NOT DETECTED Final   Proteus species NOT DETECTED NOT DETECTED Final   Serratia marcescens NOT DETECTED NOT DETECTED Final   Haemophilus influenzae NOT DETECTED NOT DETECTED Final   Neisseria meningitidis NOT DETECTED NOT DETECTED Final   Pseudomonas aeruginosa NOT DETECTED NOT DETECTED Final   Candida albicans NOT DETECTED NOT DETECTED Final   Candida glabrata NOT DETECTED NOT DETECTED Final   Candida krusei NOT DETECTED NOT DETECTED Final   Candida parapsilosis NOT DETECTED NOT DETECTED Final   Candida tropicalis NOT DETECTED NOT DETECTED Final    Comment: Performed at [facility], [place] [address] [place],  [address]  Culture, Blood     Status: None   Collection Time: 12/02/17 11:50 AM  Result Value Ref Range Status   Specimen Description BLOOD PORTA CATH  Final   Special Requests   Final    BOTTLES DRAWN AEROBIC AND ANAEROBIC Blood Culture adequate volume   Culture   Final    NO GROWTH 5 DAYS Performed at [facility], [address], [place], [place]  [address]    Report Status 12/07/2017 FINAL  Final  Blood Culture (routine x 2)     Status: None   Collection Time: 12/02/17  1:27 PM  Result Value Ref Range Status   Specimen Description   Final    BLOOD LEFT PORTA CATH Performed at [facility] [address] [place], [place] [address]    Special Requests   Final     BOTTLES DRAWN AEROBIC AND ANAEROBIC Blood Culture adequate volume Performed at [facility] [address], [place], [place] [address]    Culture   Final    NO GROWTH 5 DAYS Performed at [facility], [place] [address], [place], [place] [address]    Report Status 12/07/2017 FINAL  Final  Respiratory Panel by PCR     Status: None   Collection Time: 12/02/17  3:40 PM  Result Value Ref Range Status   Adenovirus NOT DETECTED NOT DETECTED Final   Coronavirus 229E NOT DETECTED NOT DETECTED Final   Coronavirus HKU1 NOT DETECTED NOT DETECTED Final   Coronavirus NL63 NOT DETECTED NOT DETECTED Final   Coronavirus OC43 NOT DETECTED NOT DETECTED Final   Metapneumovirus NOT DETECTED NOT DETECTED Final   Rhinovirus / Enterovirus NOT DETECTED NOT DETECTED Final   Influenza A NOT DETECTED NOT DETECTED Final   Influenza B NOT DETECTED NOT DETECTED Final   Parainfluenza Virus 1 NOT DETECTED NOT DETECTED Final   Parainfluenza Virus 2 NOT DETECTED NOT DETECTED Final   Parainfluenza Virus 3 NOT DETECTED NOT DETECTED Final   Parainfluenza Virus 4 NOT DETECTED NOT DETECTED Final   Respiratory Syncytial Virus NOT DETECTED NOT DETECTED Final   Bordetella pertussis NOT DETECTED NOT DETECTED Final   Chlamydophila pneumoniae NOT DETECTED NOT DETECTED Final   Mycoplasma pneumoniae NOT DETECTED NOT DETECTED Final    Comment: Performed at [facility], [place] [address] [place], [place] [address]   Time coordinating discharge: 35 minutes  SIGNED:  Kerney Elbe, DO [facility] 12/09/2017, 4:42 PM Pager [phone]  If 7PM-7AM, please contact night-coverage [email or web address] Password [email or web address]

## 2017-12-09 NOTE — Progress Notes (Signed)
4 Days Post-Op    CC:  abdominal pain, diarrhea, and fever  Subjective: For IR today, completed, she is afebrile VSS, labs better and she feels better.  Objective: Vital signs in last 24 hours: Temp:  [98 F (36.7 C)-98.8 F (37.1 C)] 98 F (36.7 C) (03/19 0526) Pulse Rate:  [61-117] 69 (03/19 0855) Resp:  [16-20] 20 (03/19 0855) BP: (103-134)/(74-90) 103/74 (03/19 0855) SpO2:  [80 %-98 %] 80 % (03/19 0855) Last BM Date: 12/08/17 60 PO recorded 300 IV Urine x 3 Drains 1215 Afebrile, VSS lft continue to improve CMP Latest Ref Rng & Units 12/09/2017 12/08/2017 12/07/2017  Glucose 65 - 99 mg/dL 114(H) 107(H) 107(H)  BUN 6 - 20 mg/dL 6 <5(L) <5(L)  Creatinine 0.44 - 1.00 mg/dL 0.68 0.56 0.62  Sodium 135 - 145 mmol/L 136 138 138  Potassium 3.5 - 5.1 mmol/L 4.3 3.5 3.2(L)  Chloride 101 - 111 mmol/L 100(L) 101 99(L)  CO2 22 - 32 mmol/L 27 27 29   Calcium 8.9 - 10.3 mg/dL 9.4 9.0 8.6(L)  Total Protein 6.5 - 8.1 g/dL 7.7 6.9 6.3(L)  Total Bilirubin 0.3 - 1.2 mg/dL 1.8(H) 1.9(H) 2.2(H)  Alkaline Phos 38 - 126 U/L 497(H) 485(H) 524(H)  AST 15 - 41 U/L 132(H) 96(H) 75(H)  ALT 14 - 54 U/L 60(H) 48 42    Intake/Output from previous day: 03/18 0701 - 03/19 0700 In: 370 [P.O.:60; IV Piggyback:300] Out: 1215 [Drains:1215] Intake/Output this shift: No intake/output data recorded.  General appearance: alert, cooperative and no distress GI: sore, but OK after brushings, drain working well.  Tolerated diet last PM, has not eaten today.    Lab Results:  Recent Labs    12/08/17 0500 12/09/17 0720  WBC 5.9 9.3  HGB 10.2* 11.4*  HCT 31.3* 34.6*  PLT 397 448*    BMET Recent Labs    12/08/17 0500 12/09/17 0720  NA 138 136  K 3.5 4.3  CL 101 100*  CO2 27 27  GLUCOSE 107* 114*  BUN <5* 6  CREATININE 0.56 0.68  CALCIUM 9.0 9.4   PT/INR No results for input(s): LABPROT, INR in the last 72 hours.  Recent Labs  Lab 12/05/17 0536 12/06/17 0334 12/07/17 0403 12/08/17 0500  12/09/17 0720  AST 104* 80* 75* 96* 132*  ALT 51 46 42 48 60*  ALKPHOS 585* 609* 524* 485* 497*  BILITOT 3.6* 2.9* 2.2* 1.9* 1.8*  PROT 6.1* 6.2* 6.3* 6.9 7.7  ALBUMIN 2.6* 2.6* 2.8* 3.0* 3.3*     Lipase     Component Value Date/Time   LIPASE 19 12/02/2017 1325   LIPASE 5 (L) 06/26/2017 1446     Medications: . amitriptyline  50 mg Oral QHS  . fentaNYL      . flecainide  100 mg Oral BID  . fluticasone  2 spray Each Nare Daily  . lidocaine      . metoprolol succinate  12.5 mg Oral Daily  . midazolam      . potassium chloride  40 mEq Oral BID  . sodium chloride flush  5 mL Intracatheter Q8H    Assessment/Plan Fever Active Problems: Abdominal pain Hyponatremia Adenocarcinoma of head of pancreas (HCC) Chronic anticoagulation Anemia Chronic atrial fibrillation (HCC) Hypotension/History of hypertension History of endometrial cancer History of GERD/gastritis.   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 [name]. History of PAF (on Xarelto)  Last dose 12/01/17  - pt's paincontinues to improve and fevers have resolved, WBC WNL - labs are improving and Tbili is down to 2.7.  -IRnot going to place drain due to improvement in labs -  EGD [place], with angulation of afferent room limb, prevents endoscopy - afferent loop         Syndrome  - Perc transhepatic cholangiogram, Perc biliary int/ext drain 17f; 12/05/17 IR -  S/p bile duct anastomosis brush bx x 3;  Replaced 50fr int ext biliary drain - gravity drainage      FEN:  Soft diet ID:  Vancomycin 3/12-3/13/19 DVT: SCD Foley: None Follow up:  IR/Dr. Byerly/Dr. [name]   Plan:  From our standpoint she can go home when tolerating diet.  Follow up with IR, Dr. Barry Dienes and [name].       LOS: 7 days     Roye Gustafson 12/09/2017 [phone]

## 2017-12-09 NOTE — Plan of Care (Signed)
  Progressing Education: Knowledge of General Education information will improve 12/09/2017 1313 - Progressing by Kamali Nephew, Royetta Crochet, RN Health Behavior/Discharge Planning: Ability to manage health-related needs will improve 12/09/2017 1313 - Progressing by Armine Rizzolo, Royetta Crochet, RN Activity: Risk for activity intolerance will decrease 12/09/2017 1313 - Progressing by Maymie Brunke, Royetta Crochet, RN Nutrition: Adequate nutrition will be maintained 12/09/2017 1313 - Progressing by Kiersten Coss, Royetta Crochet, RN Coping: Level of anxiety will decrease 12/09/2017 1313 - Progressing by Gennie Eisinger, Royetta Crochet, RN Elimination: Will not experience complications related to bowel motility 12/09/2017 1313 - Progressing by Lawsen Arnott, Royetta Crochet, RN Will not experience complications related to urinary retention 12/09/2017 1313 - Progressing by Della Homan, Royetta Crochet, RN Pain Managment: General experience of comfort will improve 12/09/2017 1313 - Progressing by Sahra Converse, Royetta Crochet, RN Safety: Ability to remain free from injury will improve 12/09/2017 1313 - Progressing by Fortune Torosian, Royetta Crochet, RN Skin Integrity: Risk for impaired skin integrity will decrease 12/09/2017 1313 - Progressing by Aaric Dolph, Royetta Crochet, RN

## 2017-12-09 NOTE — Procedures (Signed)
Biliary obstruction  S/p bile duct anastomosis brush bx x 3  Replaced 73fr int ext biliary drain Keep to gravity drainage  Full report in pacs

## 2017-12-09 NOTE — Discharge Instructions (Signed)
Biliary Drainage Catheter Home Guide A biliary drainage catheter is a thin, flexible tube that is inserted through your skin into the bile ducts in your liver. Bile is a thick yellow or green fluid that helps digest fat in foods. The purpose of a biliary drainage catheter is to keep bile from backing up into your liver. Backup of bile can occur when there is a blockage that prevents bile from moving from the bile ducts into the small intestine as it should. The blockage can be caused by gallstones, a tumor, or scar tissue. There are three types of biliary drainage:  External biliary drainage. With this type, bile is only drained into a collection bag outside your body (external collection bag).  Internal-external biliary drainage. With this type, bile is drained to an external collection bag as well as into your small intestine.  Internal biliary drainage. With this type, bile is only drained into your small intestine.  General home care includes these daily actions:  Inspection of your drainage catheter.  Flushing your drainage catheter with saline.  Emptying drainage from the collection bag (if present).  Recording the amount of drainage.  Checking the catheter insertion site for signs of infection. Check for: ? Redness, swelling, or pain. ? Fluid or blood. ? Warmth. ? Pus or a bad smell. How do I inspect my drainage catheter?  Check the dressing to make sure that it is dry and clean.  Look at the skin around the drainage catheter when changing the dressing for any problems such as redness, rash, or skin breakdown.  Check the drainage bag to make sure that drainage fluid is flowing into the bag well. Note the color and amount compared to other days.  Check the drainage catheter and bag for any cracks or kinks in the tubing. How do I change my dressing? The dressing over the drainage catheter should be changed every other day, or more often if needed to keep the dressing dry. Your  health care provider will instruct you about how often to change your dressing. Supplies needed:  Mild soap and warm water.  Split gauze pads, 4 x 4 inches (10 x 10 cm) to use as a dressing sponge.  Gauze pads, 4 x 4 inches (10 x 10 cm) or adhesive dressing cover.  Paper tape. How to change the dressing: 1. Wash your hands with soap and water. 2. Gently remove the old dressing. Avoid using scissors to remove the dressing because they may damage the drainage catheter. 3. Wash the skin around the insertion site with mild soap and warm water, rinse well, then pat the area dry with a clean cloth. 4. Check the skin around the drainage catheter for redness or swelling, or for yellow or green discharge that has a bad smell. 5. If the drainage catheter was stitched (sutured) to the skin, inspect the suture to make sure it is still anchored in the skin. 6. Do not apply creams, ointments, or alcohol to the site. Allow the skin to air-dry completely before you apply a new dressing. 7. Place the drainage catheter through the slit in a dressing sponge. The dressing sponge should slide under the disk that holds the drainage catheter in place. 8. Cover the drainage catheter and the dressing sponge with a 4 x 4 inch (10 x 10 cm) gauze. The drainage catheter should rest on the gauze and not on the skin. 9. Tape the dressing to the skin. 10. You may be instructed to use an  adhesive dressing covering over the top of this in place of the gauze and tape. 11. Wash your hands with soap and water. How do I flush my drainage catheter? Biliary drainagecatheters should be flushed daily, or as often as told by your health care provider. The end of the drainage catheter is closed using an IV cap. A syringe can be directly connected to the IV cap. Supplies needed:  Alcohol swab.  10 mL prefilled normal saline syringe. How to flush the drainage catheter: 1. Wash your hands with soap and water. 2. If your drainage  catheter has a stopcock attached to it, turn the stopcock toward the drainage bag. This will allow the saline to flow in the direction of your body. 3. Clean the IV cap with an alcohol swab. 4. Screw the tip of a 10 mL normal saline syringe onto the IV cap. 5. Inject the saline over 5-10 seconds. If you feel resistance while injecting, stop immediately. Avoid  pulling back on the plunger. Doing that could increase your risk of infection. 6. Remove the syringe from the cap. Turn the stopcock so that fluid flows from your body into the drainage bag. You may notice more fluid flowing into the bag after you have completed the flush. How do I attach a bag to my drainage catheter? If you are having trouble with your internal biliary drain, you may be directed by your health care provider to use bag drainage until you can be seen to fix the problem. For this reason, you should always have a collection bag and connecting tubing at home. If you do not have these supplies, remember to ask for them at your next appointment. 1. Remove the bag and the connecting tubing from their packaging. 2. Connect the funnel end of the tubing to the bag's cone-shaped stem. 3. Remove the IV cap from the biliary drain. To do this, unscrew it and replace it with the screw-on end of the tubing. 4. Save the IV cap in a plastic storage bag that can be sealed.  How do I empty my collection bag? Empty the collection bag whenever it becomes 2/3 full. Also empty it before you go to sleep. Most collection bags have a drainage valve at the bottom so the bag can be that allows them to be emptied easily. 1. Wash your hands with soap and water. 2. Hold the collection bag over the toilet, basin, or collection container. Use a measuring container if your health care provider told you to measure the drainage. 3. Unscrew the valve to open it, and allow the bag to drain. 4. Close the valve securely to avoid leakage. 5. Use a tissue or  disposable napkin to wipe the valve clean. 6. Wash the measuring container with soap and water. 7. Record the amount of drainage as told by your health care provider.  Contact a health care provider if:  Your pain gets worse after it had improved, and it is not relieved with pain medicines.  You have any questions about caring for your drainage catheter or collection bag.  You have any of these around your catheter insertion site or coming from it: ? Skin breakdown. ? Redness, swelling, or pain. ? Fluid or blood. ? Warmth to the touch. ? Pus or a bad smell. Get help right away if:  You have a fever or chills.  Your redness, swelling, or pain at the catheter insertion site gets worse, even though you are cleaning it well.  You have  leakage of bile around the drainage catheter. °· Your drainage catheter becomes blocked or clogged. °· Your drainage catheter comes out. °This information is not intended to replace advice given to you by your health care provider. Make sure you discuss any questions you have with your health care provider. °Document Released: 06/30/2013 Document Revised: 07/29/2016 Document Reviewed: 07/29/2016 °Elsevier Interactive Patient Education © 2017 [employer] ° °

## 2017-12-10 ENCOUNTER — Other Ambulatory Visit: Payer: Self-pay | Admitting: General Surgery

## 2017-12-10 ENCOUNTER — Telehealth: Payer: Self-pay

## 2017-12-10 DIAGNOSIS — K831 Obstruction of bile duct: Secondary | ICD-10-CM

## 2017-12-10 NOTE — Telephone Encounter (Signed)
I do not feel that she needs GI medicine follow-up. She has a Education officer, environmental regarding afferent limb syndrome. She has an oncologist regarding her pancreatic cancer. And she sees interventional radiology regarding her biliary obstruction requiring percutaneous drainage. I will send this to her primary GI doctor, Dr. [name], in case he feels differently. Thank you

## 2017-12-10 NOTE — Telephone Encounter (Signed)
Attempted to call pt back but got no answer. Dr. Henrene Pastor does this pt need to be scheduled for a GI follow-up from the hospital? Please advise.

## 2017-12-11 ENCOUNTER — Other Ambulatory Visit: Payer: Self-pay | Admitting: *Deleted

## 2017-12-11 ENCOUNTER — Encounter: Payer: Self-pay | Admitting: Oncology

## 2017-12-11 DIAGNOSIS — C250 Malignant neoplasm of head of pancreas: Secondary | ICD-10-CM

## 2017-12-11 NOTE — Telephone Encounter (Signed)
[name] patient.

## 2017-12-11 NOTE — Telephone Encounter (Signed)
Thank you, I agree. 

## 2017-12-11 NOTE — Telephone Encounter (Signed)
The pt notified of the recommendations per Dr Henrene Pastor and [name]

## 2017-12-12 ENCOUNTER — Telehealth: Payer: Self-pay | Admitting: *Deleted

## 2017-12-12 ENCOUNTER — Telehealth: Payer: Self-pay | Admitting: Oncology

## 2017-12-12 ENCOUNTER — Inpatient Hospital Stay: Payer: Medicare Other | Admitting: Oncology

## 2017-12-12 ENCOUNTER — Encounter (HOSPITAL_COMMUNITY): Payer: Self-pay | Admitting: Radiology

## 2017-12-12 VITALS — BP 103/74 | HR 75 | Temp 97.7°F | Resp 17 | Ht 63.0 in | Wt 140.5 lb

## 2017-12-12 DIAGNOSIS — Z8542 Personal history of malignant neoplasm of other parts of uterus: Secondary | ICD-10-CM

## 2017-12-12 DIAGNOSIS — C250 Malignant neoplasm of head of pancreas: Secondary | ICD-10-CM | POA: Diagnosis not present

## 2017-12-12 DIAGNOSIS — R1011 Right upper quadrant pain: Secondary | ICD-10-CM | POA: Diagnosis present

## 2017-12-12 DIAGNOSIS — R0600 Dyspnea, unspecified: Secondary | ICD-10-CM

## 2017-12-12 DIAGNOSIS — R509 Fever, unspecified: Secondary | ICD-10-CM | POA: Diagnosis present

## 2017-12-12 NOTE — Telephone Encounter (Signed)
Appointments scheduled AVS printed per 3/22 los ° °

## 2017-12-12 NOTE — Telephone Encounter (Signed)
Dr. [name] received call from pt's PCP requesting he see pt today. Called pt with appt for [phone].

## 2017-12-12 NOTE — Progress Notes (Signed)
[place] OFFICE PROGRESS NOTE   Diagnosis: Pancreas cancer  INTERVAL HISTORY:   [name] was admitted 12/02/2017 with increased abdominal pain and fever.  A CT of the abdomen and pelvis on 12/02/2017 revealed progressive intrahepatic and extrahepatic biliary ductal dilatation with dilatation of the eighth apparent duodenal limb.  No evidence of metastatic disease. An MRI of the abdomen 12/02/2017 feel postoperative changes from the Whipple procedure.  Progressive dilatation of the hepaticojejunostomy loop, bile ducts, and pancreatic duct were noted.  A focal area of abnormal enhancement was noted in the caudate lobe of the liver. A blood culture from hospital admission returned positive for Bacteroides fragilis.  She is completing an outpatient course of Augmentin.  An upper endoscopy 12/05/2017 revealed acute angulation of the afferent limb.  This could not be intubated despite multiple attempts.  Interventional radiology was consulted.  She underwent a percutaneous transhepatic cholangiogram on 12/05/2017.  This revealed intrahepatic and extra hepatic biliary duct dilatation.  An internal/external biliary catheter was placed.  A repeat cholangiogram 12/09/2017 revealed improvement in the biliary dilatation with a mild bile duct anastomotic stricture.  Persistent dilatation of the a fair loop was noted and contrast did not drain downstream within the small bowel.  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  A bile duct anastomosis brush biopsy was obtained. The cytology revealed no malignant cells. [name] was discharged in the hospital 12/09/2017.  She denies recurrent fever.  Abdominal pain is much improved following placement of the biliary drain.  She complains of dyspnea and hoarseness since discharge from the hospital.  No chest pain or cough.  The dyspnea is worse with exertion.  She noted the onset of dyspnea while in the hospital. She has resumed Xarelto  anticoagulation.  Objective:  Vital signs in last 24 hours:  Blood pressure 103/74, pulse 75, temperature 97.7 F (36.5 C), temperature source Oral, resp. rate 17, height 5\' 3"  (1.6 m), weight 140 lb 8 oz (63.7 kg), SpO2 98 %.    HEENT: Neck without mass Resp: Lungs clear bilaterally in the anterior and posterior lung fields, no respiratory distress Cardio: Regular rate and rhythm, no JVD GI: No hepatomegaly, left upper abdomen biliary drain site with mild erythema at the skin exit.  Bilious liquid in the drainage bag.  Mild tenderness in the mid and right upper abdomen. Vascular: No leg edema or erythema Skin: Erythema in a tape distribution surrounding the biliary drain  Portacath/PICC-without erythema  Lab Results:  Lab Results  Component Value Date   WBC 9.3 12/09/2017   HGB 11.4 (L) 12/09/2017   HCT 34.6 (L) 12/09/2017   MCV 89.9 12/09/2017   PLT 448 (H) 12/09/2017   NEUTROABS 6.7 12/09/2017    CMP     Component Value Date/Time   NA 136 12/09/2017 0720   NA 138 06/03/2017 0937   K 4.3 12/09/2017 0720   K 4.0 06/03/2017 0937   CL 100 (L) 12/09/2017 0720   CO2 27 12/09/2017 0720   CO2 24 06/03/2017 0937   GLUCOSE 114 (H) 12/09/2017 0720   GLUCOSE 111 06/03/2017 0937   BUN 6 12/09/2017 0720   BUN 13.0 06/03/2017 0937   CREATININE 0.68 12/09/2017 0720   CREATININE 0.76 12/02/2017 1006   CREATININE 0.8 06/03/2017 0937   CALCIUM 9.4 12/09/2017 0720   CALCIUM 9.2 06/03/2017 0937   PROT 7.7 12/09/2017 0720   PROT 6.9 06/03/2017 0937   ALBUMIN 3.3 (L) 12/09/2017 0720   ALBUMIN 3.6  06/03/2017 0937   AST 132 (H) 12/09/2017 0720   AST 114 (H) 12/02/2017 1006   AST 55 (H) 06/03/2017 0937   ALT 60 (H) 12/09/2017 0720   ALT 60 (H) 12/02/2017 1006   ALT 38 06/03/2017 0937   ALKPHOS 497 (H) 12/09/2017 0720   ALKPHOS 142 06/03/2017 0937   BILITOT 1.8 (H) 12/09/2017 0720   BILITOT 3.4 (H) 12/02/2017 1006   BILITOT 0.56 06/03/2017 0937   GFRNONAA >60 12/09/2017 0720     GFRNONAA >60 12/02/2017 1006   GFRAA >60 12/09/2017 0720   GFRAA >60 12/02/2017 1006     Imaging:  Ir Exchange Biliary Drain  Result Date: 12/09/2017 INDICATION: Status post Whipple procedure, biliary obstruction EXAM: CHOLANGIOGRAM THROUGH EXISTING CATHETER TRANSCATHETER BREAST BIOPSY OF THE BILE DUCT ANASTOMOSIS REPLACEMENT OF THE LEFT 10 FRENCH INTERNAL EXTERNAL BILIARY DRAIN MEDICATIONS: Patient is inpatient.  IV antibiotics are currently infusion. ANESTHESIA/SEDATION: Moderate (conscious) sedation was employed during this procedure. A total of Versed 2.0 mg and Fentanyl 100 mcg was administered intravenously. Moderate Sedation Time: 30 minutes. The patient's level of consciousness and vital signs were monitored continuously by radiology nursing throughout the procedure under my direct supervision. FLUOROSCOPY TIME:  Fluoroscopy Time: 10 minutes 54 seconds (564 mGy). COMPLICATIONS: None immediate. PROCEDURE: Informed written consent was obtained from the patient after a thorough discussion of the procedural risks, benefits and alternatives. All questions were addressed. Maximal Sterile Barrier Technique was utilized including caps, mask, sterile gowns, sterile gloves, sterile drape, hand hygiene and skin antiseptic. A timeout was performed prior to the initiation of the procedure. Under sterile conditions and local anesthesia, the existing left internal external biliary drain was injected with contrast for cholangiogram. Cholangiogram: This demonstrates improvement in the diffuse biliary dilatation extending to the bile duct anastomosis to the afferent loop. Catheter was cut and removed. 8 French sheath advanced over Amplatz guidewire. Bile duct anastomosis brushings: Alongside the Amplatz guidewire, bile duct brush biopsy performed 3 times through the 8 French sheath. Images obtained for documentation. Sample sent for cytology. The Biliary drain replacement: Ten [place] internal external biliary  drain replaced over the Amplatz guidewire. Retention loop formed in the afferent loop across the bile duct anastomosis. Ducts were decompressed by syringe aspiration. Images obtained for documentation. Catheter secured with a Prolene suture. Patient tolerated the procedure well. No immediate complication. IMPRESSION: Initial cholangiogram demonstrates improvement in biliary dilatation following drainage. 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 The afferent loop remains dilated and contrast does not drain down stream within the small bowel. 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 Successful bile duct anastomosis brush biopsy for cytology Replacement of the left internal external 10 [place] biliary drain. Electronically Signed   By: Jerilynn Mages.  Shick M.D.   On: 12/09/2017 13:49   Ir Endoluminal Bx Of Biliary Tree  Result Date: 12/09/2017 INDICATION: Status post Whipple procedure, biliary obstruction EXAM: CHOLANGIOGRAM THROUGH EXISTING CATHETER TRANSCATHETER BREAST BIOPSY OF THE BILE DUCT ANASTOMOSIS REPLACEMENT OF THE LEFT 10 FRENCH INTERNAL EXTERNAL BILIARY DRAIN MEDICATIONS: Patient is inpatient.  IV antibiotics are currently infusion. ANESTHESIA/SEDATION: Moderate (conscious) sedation was employed during this procedure. A total of Versed 2.0 mg and Fentanyl 100 mcg was administered intravenously. Moderate Sedation Time: 30 minutes. The patient's level of consciousness and vital signs were monitored continuously by radiology nursing throughout the procedure under my direct supervision. FLUOROSCOPY TIME:  Fluoroscopy  Time: 10 minutes 54 seconds (564 mGy). COMPLICATIONS: None immediate. PROCEDURE: Informed written consent was obtained from the patient after a thorough discussion of the procedural risks, benefits and alternatives. All questions were addressed. Maximal  Sterile Barrier Technique was utilized including caps, mask, sterile gowns, sterile gloves, sterile drape, hand hygiene and skin antiseptic. A timeout was performed prior to the initiation of the procedure. Under sterile conditions and local anesthesia, the existing left internal external biliary drain was injected with contrast for cholangiogram. Cholangiogram: This demonstrates improvement in the diffuse biliary dilatation extending to the bile duct anastomosis to the afferent loop. Catheter was cut and removed. 8 French sheath advanced over Amplatz guidewire. Bile duct anastomosis brushings: Alongside the Amplatz guidewire, bile duct brush biopsy performed 3 times through the 8 French sheath. Images obtained for documentation. Sample sent for cytology. The Biliary drain replacement: Ten [place] internal external biliary drain replaced over the Amplatz guidewire. Retention loop formed in the afferent loop across the bile duct anastomosis. Ducts were decompressed by syringe aspiration. Images obtained for documentation. Catheter secured with a Prolene suture. Patient tolerated the procedure well. No immediate complication. IMPRESSION: Initial cholangiogram demonstrates improvement in biliary dilatation following drainage. 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 The afferent loop remains dilated and contrast does not drain down stream within the small bowel. 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 Successful bile duct anastomosis brush biopsy for cytology Replacement of the left internal external 10 [place] biliary drain. Electronically Signed   By: Jerilynn Mages.  Shick M.D.   On: 12/09/2017 13:49    Medications: I have reviewed the patient's current medications.   Assessment/Plan: 1. 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  Gemcitabine held 11/02/2014 due to neutropenia.  Gemcitabine 11/09/2014 dose reduced 800 mg/m.  Gemcitabine held 11/16/2014 due to neutropenia.  Gemcitabine resumed 11/23/2014 every 2 week schedule.  Cycle 6 gemcitabine 01/05/2015  Cycle 7 gemcitabine 01/18/2015  Cycle 8 gemcitabine 02/01/2015  Cycle 9 gemcitabine 02/15/2015  Cycle 10 gemcitabine 03/01/2015  Cycle 11 gemcitabine 03/15/2015  Cycle 12 gemcitabine 03/29/2015  Elevated CA 19-01 May 2016  CTs 06/17/2016-new soft tissue mass at the root of the mesentery with vascular involvement  PET 06/27/2016-hypermetabolic activity associated with soft tissue adjacent to surgical clips in the central mesentery  Status post SBRTto the mesenteric mass completed 07/26/2016  CT abdomen/pelvis 08/23/2016 -mesenteric mass stable to slightly decreased in size.  Cycle 1 FOLFOX 09/03/2016  Cycle 2 FOLFOX 09/30/2016  Cycle 3 FOLFOX 10/14/2016   Cycle 4 FOLFOX 11/05/2016 (5-FU bolus eliminated and 5-FU pump dose reduced)  Cycle 5 FOLFOX 11/19/2016  CT abdomen/pelvis 11/28/2016-decreased size of soft tissue at the small bowel mesentery, mild asymmetric soft tissue at the left vaginal cuff  Cycle 6 FOLFOX 12/10/2016 (5-FU infusion further reduced and oxaliplatin reduced)  Cycle 7 FOLFOX 12/31/2016  Cycle 8 FOLFOX 01/21/2017   CT 02/24/2017-slight decrease in size of the mesenteric mass, resolution of soft tissue fullness at the left vaginal cuff, no evidence of disease progression  CT abdomen/pelvis 06/20/2017-stable ill-defined soft tissue at the mesenteric root, mild increased asymmetry at the left vaginal  cuff, no other  evidence of disease progression  Elevated CA 19-9 09/11/2018  CT abdomen/pelvis 11/18/2017- no evidence of recurrent pancreas cancer, dilated afferent loop, intrahepatic biliary dilatation   2. bile duct obstruction secondary to #1, status post an ERCP with stent placement 09/23/2015hypermetabolic soft tissue in the central mesentery, no other evidence of metastatic disease, stable mildly enlarged portal caval node  3. Admission with post ERCP pancreatitis 06/16/2014  4. History of abdominal pain secondary to #1  5. Pulmonary embolism diagnosed on a CT of the abdomen 09/16/2014  Negative lower extremity Dopplers 09/17/2014  6. Multiple orthopedic surgical procedures  7. Endometrial cancer,stage IA, grade 1 endometrioid adenocarcinoma, 18% myometrial invasion, no lymphovascular space involvement, negative washings  Status post robotic total hysterectomy and bilateral salpingo-oophorectomy 11/30/2010  Recurrent tumor left lateral vagina status post biopsy 11/24/2014 with pathology confirming adenocarcinoma with focal squamous differentiation consistent with endometrial adenocarcinoma  Staging CT scans 12/06/2014 with no evidence of local pancreatic cancer recurrence. Small fluid collection adjacent to the left adrenal gland. Severe hepatic steatosis. No evidence of local extension of endometrial carcinoma. Carcinoma not well-defined at the vaginal cuff. 5 mm right external iliac lymph node. 3.6 mm left external iliac lymph node  Brachytherapy initiated 12/22/2014, completed 01/19/2015  CT abdomen/pelvis 07/24/2015 revealed a 3 x 4 cm soft tissue focus at the vaginal apex  PET scan 08/11/2015 revealed no mass at the vaginal apex and no evidence of metastatic disease  CT 11/28/2016-mild asymmetric soft tissue at the left vaginal cuff, resolved on CT 02/24/2017  CT abdomen/pelvis 06/20/2017-stable mild ill-defined soft tissue density in the mesenteric root. Stable mild  portacaval lymphadenopathy and subcentimeter right retroperitoneal lymph nodes. Mild increased size of asymmetric soft tissue density involving the left vaginal cuff.  PET scan 07/19/2017-no suspicious hypermetabolic activity within the neck, chest, abdomen or pelvis. Specifically no evidence of residual hypermetabolic tumor in the surgical bed or at the vaginal cuff. Hypermetabolic activity in the lumbar spine at the level of the right L3-4 facet joint appears degenerative.  MRI abdomen 12/02/2017-focal area of abnormal signal in the caudate lobe of the liver-unclear etiology, dilation of the hepaticojejunostomy loop, bile ducts, and pancreatic duct-stricture formation versus recurrent tumor  8. History of atrial fibrillation-maintained on xarelto  9. Family history of multiple cancers-negative CancerNext gene panel  10. Prolonged nausea following the pancreaticoduodenectomy. Improved 10/26/2014.  11. Port-A-Cath placement 10/21/2014.  12. History of Neutropenia secondary to chemotherapy   13. Diarrhea. Question pancreatic insufficiency. Pancreatic enzyme replacement initiated 01/05/2015. Recurrent diarrhea following a course of antibiotics March 2017.  14. History of positional vertigo-resolved  15. 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 Neutropenia secondary to chemotherapy 09/18/2016.  17. Delayed nausea and diarrhea following FOLFOX. Emend added with cycle 2. Decadron prophylaxis added with cycle 3  18. Diarrhea 10/28/2016. Question related to chemotherapy. Negative C. difficile testing 10/31/2016.  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  20.  Admission 12/02/2017 with Bacteroides bacteremia  Biliary obstruction documented on CT/MRI 12/02/2017  Upper endoscopy 12/05/2017 revealed angulation of the apparent  limb precluding intubation  Status post placement of a biliary drain 12/05/2017, replaced 12/09/2017  Bile duct brushings 12/09/2017-negative for malignancy   Disposition: [name] is recovering from the admission with bacteremia secondary to biliary obstruction.  The etiology of the apparent limb obstruction remains unclear.  This could be related to postsurgical/radiation scarring or recurrent tumor.  No recurrent  pancreas cancer has been documented to date.  She plans to schedule a follow-up appointment with Dr. Barry Dienes.  She presents today with dyspnea.  She does not appear short of breath in the examination room today.  Her examination is unremarkable.  I have a low clinical suspicion for a pulmonary embolism or other primary cardiopulmonary process.  She will contact us for increased dyspnea or new symptoms. She is scheduled for evaluation in interventional radiology 12/18/2017.  She will return here on 12/24/2017.  25 minutes were spent with the patient today.  The majority of the time was used for counseling and coordination of care. Betsy Coder, MD  12/12/2017  3:06 PM

## 2017-12-16 ENCOUNTER — Ambulatory Visit: Payer: Medicare Other | Admitting: Nurse Practitioner

## 2017-12-16 ENCOUNTER — Telehealth: Payer: Self-pay | Admitting: Cardiology

## 2017-12-16 ENCOUNTER — Other Ambulatory Visit: Payer: Medicare Other

## 2017-12-16 DIAGNOSIS — I480 Paroxysmal atrial fibrillation: Secondary | ICD-10-CM

## 2017-12-16 DIAGNOSIS — R0602 Shortness of breath: Secondary | ICD-10-CM

## 2017-12-16 DIAGNOSIS — I10 Essential (primary) hypertension: Secondary | ICD-10-CM

## 2017-12-16 NOTE — Telephone Encounter (Signed)
Pam, can you try to get Ms. Branch in on Friday with APP. She has been short of breath. CT scan of chest was negative for PE and did not show any pulmonary edema or effusion. Hg is 13, prior ECHO in 2015 normal EF. She was in NSR prior. On Xarelto. Spoke with Joni Reining her primary.   I would like her to get an ECHO as well.   I told Carola Rhine to give her 3 days of lasix 20mg  PO QD.   Thanks.  Candee Furbish, MD

## 2017-12-16 NOTE — Telephone Encounter (Signed)
Pt has been scheduled for echo 3/29 at 10 am and to see Weldon weaver, PA at 11:15 am the same day.  Romie Minus in scheduling confirmed with patient.

## 2017-12-17 ENCOUNTER — Other Ambulatory Visit: Payer: Self-pay | Admitting: Emergency Medicine

## 2017-12-17 DIAGNOSIS — C250 Malignant neoplasm of head of pancreas: Secondary | ICD-10-CM

## 2017-12-17 MED ORDER — AMITRIPTYLINE HCL 25 MG PO TABS: 50.0000 mg | ORAL_TABLET | Freq: Every day | ORAL | 2 refills | Status: DC

## 2017-12-18 ENCOUNTER — Ambulatory Visit
Admission: RE | Admit: 2017-12-18 | Discharge: 2017-12-18 | Disposition: A | Discharge status: Home / Self Care | Payer: Medicare Other | Source: Ambulatory Visit | Attending: General Surgery | Admitting: General Surgery

## 2017-12-18 ENCOUNTER — Encounter: Payer: Self-pay | Admitting: Radiology

## 2017-12-18 DIAGNOSIS — K831 Obstruction of bile duct: Secondary | ICD-10-CM

## 2017-12-18 HISTORY — PX: IR RADIOLOGIST EVAL & MGMT: IMG5224

## 2017-12-18 MED FILL — MONOJECT 0.9% NA CL SYRING: 0.9 | 60 days supply | Qty: 300 | Fill #0

## 2017-12-19 ENCOUNTER — Observation Stay (HOSPITAL_COMMUNITY)
Admission: EM | Admit: 2017-12-19 | Discharge: 2017-12-20 | Disposition: A | Discharge status: Home / Self Care | Payer: Medicare Other | Attending: Internal Medicine | Admitting: Internal Medicine

## 2017-12-19 ENCOUNTER — Other Ambulatory Visit: Payer: Self-pay

## 2017-12-19 ENCOUNTER — Emergency Department (HOSPITAL_COMMUNITY): Payer: Medicare Other

## 2017-12-19 ENCOUNTER — Ambulatory Visit (HOSPITAL_BASED_OUTPATIENT_CLINIC_OR_DEPARTMENT_OTHER): Payer: Medicare Other

## 2017-12-19 ENCOUNTER — Other Ambulatory Visit: Payer: Self-pay | Admitting: *Deleted

## 2017-12-19 ENCOUNTER — Other Ambulatory Visit: Payer: Self-pay | Admitting: Cardiology

## 2017-12-19 ENCOUNTER — Ambulatory Visit: Payer: Medicare Other | Admitting: Physician Assistant

## 2017-12-19 ENCOUNTER — Other Ambulatory Visit (HOSPITAL_COMMUNITY): Payer: Medicare Other

## 2017-12-19 ENCOUNTER — Encounter: Payer: Self-pay | Admitting: Physician Assistant

## 2017-12-19 ENCOUNTER — Other Ambulatory Visit (HOSPITAL_COMMUNITY): Payer: Self-pay

## 2017-12-19 ENCOUNTER — Encounter (HOSPITAL_COMMUNITY): Payer: Self-pay

## 2017-12-19 ENCOUNTER — Ambulatory Visit (HOSPITAL_COMMUNITY): Admission: RE | Admit: 2017-12-19 | Payer: Medicare Other | Source: Ambulatory Visit

## 2017-12-19 VITALS — BP 100/70 | HR 74 | Ht 62.0 in | Wt 136.0 lb

## 2017-12-19 DIAGNOSIS — I951 Orthostatic hypotension: Secondary | ICD-10-CM | POA: Diagnosis not present

## 2017-12-19 DIAGNOSIS — I712 Thoracic aortic aneurysm, without rupture: Secondary | ICD-10-CM | POA: Diagnosis not present

## 2017-12-19 DIAGNOSIS — R0902 Hypoxemia: Secondary | ICD-10-CM

## 2017-12-19 DIAGNOSIS — C250 Malignant neoplasm of head of pancreas: Secondary | ICD-10-CM

## 2017-12-19 DIAGNOSIS — K831 Obstruction of bile duct: Secondary | ICD-10-CM | POA: Diagnosis present

## 2017-12-19 DIAGNOSIS — R2689 Other abnormalities of gait and mobility: Secondary | ICD-10-CM | POA: Diagnosis not present

## 2017-12-19 DIAGNOSIS — R0602 Shortness of breath: Secondary | ICD-10-CM

## 2017-12-19 DIAGNOSIS — Z7901 Long term (current) use of anticoagulants: Secondary | ICD-10-CM

## 2017-12-19 DIAGNOSIS — I482 Chronic atrial fibrillation, unspecified: Secondary | ICD-10-CM | POA: Diagnosis present

## 2017-12-19 DIAGNOSIS — I10 Essential (primary) hypertension: Secondary | ICD-10-CM | POA: Diagnosis present

## 2017-12-19 DIAGNOSIS — I480 Paroxysmal atrial fibrillation: Secondary | ICD-10-CM

## 2017-12-19 DIAGNOSIS — Z79899 Other long term (current) drug therapy: Secondary | ICD-10-CM | POA: Diagnosis not present

## 2017-12-19 DIAGNOSIS — G4733 Obstructive sleep apnea (adult) (pediatric): Secondary | ICD-10-CM | POA: Diagnosis present

## 2017-12-19 DIAGNOSIS — Z8507 Personal history of malignant neoplasm of pancreas: Secondary | ICD-10-CM | POA: Diagnosis not present

## 2017-12-19 DIAGNOSIS — E860 Dehydration: Secondary | ICD-10-CM | POA: Diagnosis not present

## 2017-12-19 DIAGNOSIS — C541 Malignant neoplasm of endometrium: Secondary | ICD-10-CM | POA: Diagnosis present

## 2017-12-19 DIAGNOSIS — Z8542 Personal history of malignant neoplasm of other parts of uterus: Secondary | ICD-10-CM | POA: Insufficient documentation

## 2017-12-19 DIAGNOSIS — J849 Interstitial pulmonary disease, unspecified: Secondary | ICD-10-CM

## 2017-12-19 DIAGNOSIS — I2699 Other pulmonary embolism without acute cor pulmonale: Secondary | ICD-10-CM | POA: Diagnosis present

## 2017-12-19 DIAGNOSIS — E871 Hypo-osmolality and hyponatremia: Secondary | ICD-10-CM | POA: Diagnosis not present

## 2017-12-19 DIAGNOSIS — I7121 Aneurysm of the ascending aorta, without rupture: Secondary | ICD-10-CM

## 2017-12-19 LAB — HEPATIC FUNCTION PANEL
ALBUMIN: 3.7 g/dL (ref 3.5–5.0)
ALT: 40 U/L (ref 14–54)
AST: 56 U/L — ABNORMAL HIGH (ref 15–41)
Alkaline Phosphatase: 250 U/L — ABNORMAL HIGH (ref 38–126)
BILIRUBIN DIRECT: 0.5 mg/dL (ref 0.1–0.5)
BILIRUBIN TOTAL: 1.5 mg/dL — AB (ref 0.3–1.2)
Indirect Bilirubin: 1 mg/dL — ABNORMAL HIGH (ref 0.3–0.9)
Total Protein: 7.5 g/dL (ref 6.5–8.1)

## 2017-12-19 LAB — CBC WITH DIFFERENTIAL/PLATELET
BASOS ABS: 0 10*3/uL (ref 0.0–0.1)
BASOS PCT: 1 %
EOS ABS: 0.3 10*3/uL (ref 0.0–0.7)
Eosinophils Relative: 4 %
HCT: 34.7 % — ABNORMAL LOW (ref 36.0–46.0)
Hemoglobin: 12.2 g/dL (ref 12.0–15.0)
LYMPHS PCT: 38 %
Lymphs Abs: 2.4 10*3/uL (ref 0.7–4.0)
MCH: 29.3 pg (ref 26.0–34.0)
MCHC: 35.2 g/dL (ref 30.0–36.0)
MCV: 83.4 fL (ref 78.0–100.0)
MONO ABS: 0.4 10*3/uL (ref 0.1–1.0)
Monocytes Relative: 7 %
Neutro Abs: 3.2 10*3/uL (ref 1.7–7.7)
Neutrophils Relative %: 50 %
Platelets: 269 10*3/uL (ref 150–400)
RBC: 4.16 MIL/uL (ref 3.87–5.11)
RDW: 13.5 % (ref 11.5–15.5)
WBC: 6.3 10*3/uL (ref 4.0–10.5)

## 2017-12-19 LAB — BASIC METABOLIC PANEL
ANION GAP: 10 (ref 5–15)
BUN: 22 mg/dL — ABNORMAL HIGH (ref 6–20)
CALCIUM: 9.4 mg/dL (ref 8.9–10.3)
CO2: 20 mmol/L — ABNORMAL LOW (ref 22–32)
Chloride: 98 mmol/L — ABNORMAL LOW (ref 101–111)
Creatinine, Ser: 0.81 mg/dL (ref 0.44–1.00)
GLUCOSE: 110 mg/dL — AB (ref 65–99)
POTASSIUM: 3.7 mmol/L (ref 3.5–5.1)
SODIUM: 128 mmol/L — AB (ref 135–145)

## 2017-12-19 LAB — I-STAT CHEM 8, ED
BUN: 23 mg/dL — ABNORMAL HIGH (ref 6–20)
CALCIUM ION: 1.14 mmol/L — AB (ref 1.15–1.40)
CHLORIDE: 95 mmol/L — AB (ref 101–111)
Creatinine, Ser: 0.7 mg/dL (ref 0.44–1.00)
Glucose, Bld: 110 mg/dL — ABNORMAL HIGH (ref 65–99)
HCT: 36 % (ref 36.0–46.0)
HEMOGLOBIN: 12.2 g/dL (ref 12.0–15.0)
POTASSIUM: 3.6 mmol/L (ref 3.5–5.1)
SODIUM: 130 mmol/L — AB (ref 135–145)
TCO2: 20 mmol/L — ABNORMAL LOW (ref 22–32)

## 2017-12-19 LAB — I-STAT TROPONIN, ED: TROPONIN I, POC: 0.01 ng/mL (ref 0.00–0.08)

## 2017-12-19 MED ORDER — SODIUM CHLORIDE 0.9 % IV SOLN
INTRAVENOUS | Status: DC
  Administered 2017-12-19 – 2017-12-20 (×2): via INTRAVENOUS

## 2017-12-19 MED ORDER — ZOLPIDEM TARTRATE 5 MG PO TABS
5.0000 mg | ORAL_TABLET | Freq: Every evening | ORAL | Status: DC | PRN
  Administered 2017-12-19: 5 mg via ORAL
  Filled 2017-12-19: qty 1

## 2017-12-19 MED ORDER — PANCRELIPASE (LIP-PROT-AMYL) 12000-38000 UNITS PO CPEP: 24000.0000 [IU] | ORAL_CAPSULE | Freq: Three times a day (TID) | ORAL | Status: DC

## 2017-12-19 MED ORDER — IOPAMIDOL (ISOVUE-370) INJECTION 76%
INTRAVENOUS | Status: AC
  Administered 2017-12-19: 100 mL
  Filled 2017-12-19: qty 100

## 2017-12-19 MED ORDER — ENSURE ENLIVE PO LIQD
237.0000 mL | Freq: Two times a day (BID) | ORAL | Status: DC
  Administered 2017-12-20: 237 mL via ORAL

## 2017-12-19 MED ORDER — MORPHINE SULFATE 15 MG PO TABS: 15.0000 mg | ORAL_TABLET | ORAL | Status: DC | PRN

## 2017-12-19 MED ORDER — METOPROLOL SUCCINATE ER 25 MG PO TB24
12.5000 mg | ORAL_TABLET | Freq: Every day | ORAL | Status: DC
  Administered 2017-12-20: 12.5 mg via ORAL
  Filled 2017-12-19: qty 1

## 2017-12-19 MED ORDER — LORAZEPAM 1 MG PO TABS: 1.0000 mg | ORAL_TABLET | Freq: Three times a day (TID) | ORAL | Status: DC | PRN

## 2017-12-19 MED ORDER — AMITRIPTYLINE HCL 25 MG PO TABS
50.0000 mg | ORAL_TABLET | Freq: Every day | ORAL | Status: DC
  Administered 2017-12-19: 50 mg via ORAL
  Filled 2017-12-19: qty 2

## 2017-12-19 MED ORDER — GUAIFENESIN ER 600 MG PO TB12
600.0000 mg | ORAL_TABLET | Freq: Every morning | ORAL | Status: DC
  Administered 2017-12-20: 600 mg via ORAL
  Filled 2017-12-19: qty 1

## 2017-12-19 MED ORDER — SODIUM CHLORIDE 0.9 % IV BOLUS
1000.0000 mL | Freq: Once | INTRAVENOUS | Status: AC
  Administered 2017-12-19: 1000 mL via INTRAVENOUS

## 2017-12-19 MED ORDER — FLECAINIDE ACETATE 100 MG PO TABS
100.0000 mg | ORAL_TABLET | Freq: Two times a day (BID) | ORAL | Status: DC
  Administered 2017-12-19 – 2017-12-20 (×2): 100 mg via ORAL
  Filled 2017-12-19 (×3): qty 1

## 2017-12-19 MED ORDER — PROMETHAZINE HCL 25 MG PO TABS: 12.5000 mg | ORAL_TABLET | Freq: Four times a day (QID) | ORAL | Status: DC | PRN

## 2017-12-19 MED ORDER — RIVAROXABAN 20 MG PO TABS
20.0000 mg | ORAL_TABLET | Freq: Every day | ORAL | Status: DC
  Administered 2017-12-19: 20 mg via ORAL
  Filled 2017-12-19: qty 1

## 2017-12-19 MED ORDER — ALBUTEROL SULFATE (2.5 MG/3ML) 0.083% IN NEBU: 3.0000 mL | INHALATION_SOLUTION | Freq: Four times a day (QID) | RESPIRATORY_TRACT | Status: DC | PRN

## 2017-12-19 MED ORDER — FLUTICASONE PROPIONATE 50 MCG/ACT NA SUSP
1.0000 | Freq: Two times a day (BID) | NASAL | Status: DC | PRN
  Filled 2017-12-19: qty 16

## 2017-12-19 NOTE — ED Notes (Signed)
Patient transported to CT 

## 2017-12-19 NOTE — ED Triage Notes (Signed)
Pt arrives to ED from PCP with complaints of nonradiating chest pain, SOB, and abnormal EKG since 1200 noon. EMS reports pt has been in and out of the hospital with treatment for sepsis. pt is on Xarelto and lasix. Pt placed in position of comfort with bed locked and lowered, call bell in reach.

## 2017-12-19 NOTE — Patient Instructions (Addendum)
Medication Instructions:  1. Your physician recommends that you continue on your current medications as directed. Please refer to the Current Medication list given to you today.    Labwork: TODAY PRO BNP, BMET  Testing/Procedures: YOU ARE NEEDING TO HAVE A TEST CALLED A V/Q SCAN; THIS IS WILL BE DONE OVER AT [place] TODAY  YOU WILL HAVE  CHEST X-RAY TODAY AS WELL AT [place]   Follow-Up: [place], [facility] 01/05/18 @ 11:45 AM   YOU ARE BEING REFERRED TO [facility] ASAP  Any Other Special Instructions Will Be Listed Below (If Applicable).     If you need a refill on your cardiac medications before your next appointment, please call your pharmacy.
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
## 2017-12-19 NOTE — ED Notes (Addendum)
Pt c/o sob and and lightheadness after returning from x-ray; pt states she got short of breath and hot upon standing for x ray; pt placed on 2 L via [place]; Pt states she dealt with bouts of vertigo after whipple sx in 2015; pt states she is no longer feeling sob, but is tearful at this time

## 2017-12-19 NOTE — Telephone Encounter (Signed)
Pt saw S.Weaver, PA today. Wt is 61.7Kg, pt is a 72 yr old female. Last SCr was 0.68 from 12/09/17. CrCl is 75 mL/min. Will refill Xarelto 20mg  QD.

## 2017-12-19 NOTE — ED Notes (Signed)
Pt ambulatory to restroom

## 2017-12-20 DIAGNOSIS — I951 Orthostatic hypotension: Secondary | ICD-10-CM | POA: Diagnosis not present

## 2017-12-20 LAB — CBC WITH DIFFERENTIAL/PLATELET
BASOS ABS: 0 10*3/uL (ref 0.0–0.1)
Basophils Relative: 1 %
EOS PCT: 5 %
Eosinophils Absolute: 0.3 10*3/uL (ref 0.0–0.7)
HEMATOCRIT: 33.8 % — AB (ref 36.0–46.0)
Hemoglobin: 11.5 g/dL — ABNORMAL LOW (ref 12.0–15.0)
LYMPHS ABS: 2.2 10*3/uL (ref 0.7–4.0)
Lymphocytes Relative: 34 %
MCH: 28.7 pg (ref 26.0–34.0)
MCHC: 34 g/dL (ref 30.0–36.0)
MCV: 84.3 fL (ref 78.0–100.0)
MONO ABS: 0.3 10*3/uL (ref 0.1–1.0)
MONOS PCT: 5 %
NEUTROS ABS: 3.5 10*3/uL (ref 1.7–7.7)
Neutrophils Relative %: 55 %
PLATELETS: 248 10*3/uL (ref 150–400)
RBC: 4.01 MIL/uL (ref 3.87–5.11)
RDW: 13.7 % (ref 11.5–15.5)
WBC: 6.3 10*3/uL (ref 4.0–10.5)

## 2017-12-20 LAB — BASIC METABOLIC PANEL
ANION GAP: 10 (ref 5–15)
BUN: 21 mg/dL — AB (ref 6–20)
CHLORIDE: 101 mmol/L (ref 101–111)
CO2: 19 mmol/L — AB (ref 22–32)
Calcium: 9.1 mg/dL (ref 8.9–10.3)
Creatinine, Ser: 0.84 mg/dL (ref 0.44–1.00)
GFR calc Af Amer: >60 mL/min (ref >60)
GFR calc non Af Amer: >60 mL/min (ref >60)
GLUCOSE: 114 mg/dL — AB (ref 65–99)
Potassium: 3.7 mmol/L (ref 3.5–5.1)
Sodium: 130 mmol/L — ABNORMAL LOW (ref 135–145)

## 2017-12-20 LAB — PRO B NATRIURETIC PEPTIDE: NT-Pro BNP: 103 pg/mL (ref 0–301)

## 2017-12-20 LAB — HEPATIC FUNCTION PANEL
ALK PHOS: 250 U/L — AB (ref 38–126)
ALT: 41 U/L (ref 14–54)
AST: 57 U/L — AB (ref 15–41)
Albumin: 3.4 g/dL — ABNORMAL LOW (ref 3.5–5.0)
BILIRUBIN DIRECT: 0.5 mg/dL (ref 0.1–0.5)
BILIRUBIN INDIRECT: 0.7 mg/dL (ref 0.3–0.9)
TOTAL PROTEIN: 6.9 g/dL (ref 6.5–8.1)
Total Bilirubin: 1.2 mg/dL (ref 0.3–1.2)

## 2017-12-20 LAB — TSH: TSH: 3.113 u[IU]/mL (ref 0.350–4.500)

## 2017-12-20 LAB — CK: Total CK: 34 U/L — ABNORMAL LOW (ref 38–234)

## 2017-12-20 LAB — CORTISOL: Cortisol, Plasma: 10 ug/dL

## 2017-12-20 MED ORDER — HEPARIN SOD (PORK) LOCK FLUSH 100 UNIT/ML IV SOLN
500.0000 [IU] | INTRAVENOUS | Status: DC | PRN
  Administered 2017-12-20: 500 [IU]
  Filled 2017-12-20 (×2): qty 5

## 2017-12-20 MED ORDER — HEPARIN SOD (PORK) LOCK FLUSH 100 UNIT/ML IV SOLN
500.0000 [IU] | INTRAVENOUS | Status: DC
  Filled 2017-12-20: qty 5

## 2017-12-20 NOTE — Evaluation (Signed)
Physical Therapy Evaluation Patient Details Name: [name] MRN: [id number] DOB: April 26, 1946 Today's Date: 12/20/2017   History of Present Illness  [name] is a 72 y.o. female with history of pancreatic cancer status post Whipple's procedure who was recently admitted for sepsis from cholangitis and had completed oral antibiotics on December 12, 2017 last week has been experiencing increasing weakness fatigue and shortness of breath last 3 days.  Had gone to her cardiologist this morning and was found to be weak orthostatic.  Since patient was complaining of shortness of breath patient had 2D echo done and as per the cardiology notes was unremarkable.  EF was 55-60%.   Clinical Impression  Pt admitted with above diagnosis. Pt currently with functional limitations due to the deficits listed below (see PT Problem List).   Pt was able to ambulate in hallway without device.  BP was orthostatic initially but did stabilize.  Pt was asymptomatic. Will follow to ensure mobility while in hospital.  3   Pt will benefit from skilled PT to increase their independence and safety with mobility to allow discharge to the venue listed below.   Orthostatic BPs  Supine 93/67, 75 bpm  Sitting 108/97, 80 bpm  Standing 80/67, 84 bpm  Standing after 3 min 85/70, 85 bpm   Pt asymptomatic even though BP was fluctuating.  After pt walked in halls, BP was 125/87 with HR 88 bpm.  O2 sats 100% on RA.    Follow Up Recommendations No PT follow up;Supervision/Assistance - 24 hour    Equipment Recommendations  None recommended by PT    Recommendations for Other Services       Precautions / Restrictions Precautions Precautions: Fall Precaution Comments: orthostatic on eval, pancreatic drain Restrictions Weight Bearing Restrictions: No      Mobility  Bed Mobility Overal bed mobility: Independent                Transfers Overall transfer level: Independent                   Ambulation/Gait Ambulation/Gait assistance: Supervision Ambulation Distance (Feet): 200 Feet Assistive device: None Gait Pattern/deviations: Step-through pattern;Decreased stride length   Gait velocity interpretation: Below normal speed for age/gender General Gait Details: Pt able to ambulate without device with steady gait.  Pt had orthostatic BP but was not symptomatic.  BP did stabilize after a few minutes.    Stairs            Wheelchair Mobility    Modified Rankin (Stroke Patients Only)       Balance Overall balance assessment: No apparent balance deficits (not formally assessed)                                           Pertinent Vitals/Pain Pain Assessment: No/denies pain    Home Living Family/patient expects to be discharged to:: Private residence Living Arrangements: Spouse/significant other Available Help at Discharge: Family Type of Home: House Home Access: Stairs to enter Entrance Stairs-Rails: Left Entrance Stairs-Number of Steps: 5 Home Layout: One level Home Equipment: Cane - single point;Walker - 2 wheels;Bedside commode      Prior Function Level of Independence: Independent               Hand Dominance   Dominant Hand: Right    Extremity/Trunk Assessment   Upper Extremity Assessment Upper Extremity Assessment:  Defer to OT evaluation    Lower Extremity Assessment Lower Extremity Assessment: Overall WFL for tasks assessed    Cervical / Trunk Assessment Cervical / Trunk Assessment: Normal  Communication   Communication: No difficulties  Cognition Arousal/Alertness: Awake/alert Behavior During Therapy: WFL for tasks assessed/performed Overall Cognitive Status: Within Functional Limits for tasks assessed                                        General Comments      Exercises     Assessment/Plan    PT Assessment Patient needs continued PT services  PT Problem List Decreased activity  tolerance;Decreased mobility;Decreased knowledge of precautions       PT Treatment Interventions DME instruction;Gait training;Functional mobility training;Stair training;Therapeutic activities;Therapeutic exercise;Balance training;Patient/family education    PT Goals (Current goals can be found in the Care Plan section)  Acute Rehab PT Goals Patient Stated Goal: to get home PT Goal Formulation: With patient Time For Goal Achievement: 01/03/18 Potential to Achieve Goals: Good    Frequency Min 3X/week   Barriers to discharge        Co-evaluation               AM-PAC PT "6 Clicks" Daily Activity  Outcome Measure Difficulty turning over in bed (including adjusting bedclothes, sheets and blankets)?: None Difficulty moving from lying on back to sitting on the side of the bed? : None Difficulty sitting down on and standing up from a chair with arms (e.g., wheelchair, bedside commode, etc,.)?: None Help needed moving to and from a bed to chair (including a wheelchair)?: None Help needed walking in hospital room?: None Help needed climbing 3-5 steps with a railing? : A Little 6 Click Score: 23    End of Session Equipment Utilized During Treatment: Gait belt Activity Tolerance: Patient tolerated treatment well Patient left: in chair;with call bell/phone within reach;with family/visitor present Nurse Communication: Mobility status PT Visit Diagnosis: Other abnormalities of gait and mobility (R26.89)    Time: 6384-5364 PT Time Calculation (min) (ACUTE ONLY): 23 min   Charges:   PT Evaluation $PT Eval Low Complexity: 1 Low PT Treatments $Gait Training: 8-22 mins   PT G Codes:        Cecile Guevara,PT Acute Rehabilitation [phone]  Denice Paradise 12/20/2017, 10:37 AM

## 2017-12-20 NOTE — Progress Notes (Signed)
Initial Nutrition Assessment  DOCUMENTATION CODES:   Non-severe (moderate) malnutrition in context of chronic illness  INTERVENTION:   Ensure Enlive po BID, each supplement provides 350 kcal and 20 grams of protein  NUTRITION DIAGNOSIS:   Moderate Malnutrition related to chronic illness, cancer and cancer related treatments as evidenced by percent weight loss, energy intake < 75% for > or equal to 1 month, mild fat depletion, moderate muscle depletion.  GOAL:   Patient will meet greater than or equal to 90% of their needs  MONITOR:   PO intake, Supplement acceptance, Weight trends, Labs  REASON FOR ASSESSMENT:   Malnutrition Screening Tool    ASSESSMENT:   Patient with PMH significant for pancreatic cancer status post Whipple's procedure who was recently admitted for sepsis from cholangitis and had completed oral antibiotics on December 12, 2017. Presents this admission with increasing weakness fatigue and shortness of breath last 3 days.   3/19- biliary tube LUQ placed  Spoke with pt at bedside. Appetite has remained poor over the last 2-3 months due to ongoing nausea s/p chemotherapy. Pt reports her last treatment was in November and food has not tasted the same since. Pt has talked to [facility] RD and knows the importance of protein. She includes one Ensure with her two small meals daily. Discussed the importance of continued protein intake for preservation of lean body mass. Suggested pt increase her Ensure intake to 2-3 bottles if her appetite remains poor. Pt very willing to make changes.   Pt endorses a UBW of 156 lb, the last time being at that weight one month ago. Records indicate pt weighed 15 lb 11/19/17 and 137 lb this admission (11% in one month, significant for time frame). Nutrition-Focused physical exam completed.   Medications reviewed.  Labs reviewed: Na 130 (L) AST 57 (H)  NUTRITION - FOCUSED PHYSICAL EXAM:    Most Recent Value  Orbital Region  No  depletion  Upper Arm Region  Mild depletion  Thoracic and Lumbar Region  No depletion  Buccal Region  No depletion  Temple Region  Moderate depletion  Clavicle Bone Region  Moderate depletion  Clavicle and Acromion Bone Region  No depletion  Scapular Bone Region  No depletion  Dorsal Hand  No depletion  Patellar Region  No depletion  Anterior Thigh Region  No depletion  Posterior Calf Region  No depletion  Edema (RD Assessment)  None  Hair  Reviewed  Eyes  Reviewed  Mouth  Reviewed  Skin  Reviewed  Nails  Reviewed     Diet Order:  Diet Heart Room service appropriate? Yes; Fluid consistency: Thin  EDUCATION NEEDS:   Education needs have been addressed  Skin:  Skin Assessment: Reviewed RN Assessment  Last BM:  12/20/17  Height:   Ht Readings from Last 1 Encounters:  12/19/17 5\' 2"  (1.575 m)    Weight:   Wt Readings from Last 1 Encounters:  12/20/17 137 lb 4.8 oz (62.3 kg)    Ideal Body Weight:  50 kg  BMI:  Body mass index is 25.11 kg/m.  Estimated Nutritional Needs:   Kcal:  1650-1850 kcal  Protein:  80-90 g  Fluid:  >1.6 L/day    [name] RD, LDN Clinical Nutrition Pager # [phone]

## 2017-12-20 NOTE — Discharge Planning (Addendum)
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 Unremarkable appearance of the kidneys. Stomach/Bowel: The stomach and the remaining small bowel loops are unremarkable. Dilatation of the hepaticojejunostomy loop is identified. There appears to be marked narrowing of the hepaticojejunostomy loop at the point at which the crosses the midline (from right to left) between the aorta and SMV, image [id number]. Findings may reflect underlying postsurgical scarring. Recurrent tumor not excluded. Vascular/Lymphatic:  Normal appearance of the abdominal aorta. Other:  No ascites or focal fluid collections identified. Musculoskeletal: No suspicious bone lesions identified. IMPRESSION: 1. Postoperative changes from Whipple procedure again noted. On today's study there is marked and progressive dilatation of the hepaticojejunostomy loop, bile ducts and main pancreatic duct. Focal area of narrowing of the hepaticojejunostomy loop as it crosses the midline from right to left. Findings may represent underlying  postsurgical scarring and stricture formation versus recurrent tumor. 2. There is a focal area of abnormal signal in the region of the caudate lobe of liver with increased enhancement. It is unclear whether not this is abnormal signal and enhancement within the liver parenchyma or adenopathy. A PET-CT may be helpful to assess for recurrent metabolically active tumor. Electronically Signed   By: Kerby Moors M.D.   On: 12/03/2017 08:37   Ir Int Lianne Cure Biliary Drain With Cholangiogram  Result Date: 12/05/2017 INDICATION: Pancreatic carcinoma status post Whipple, now with epigastric pain and dilatation of the hepaticojejunostomy loop and bile ducts. Elevated bilirubin. Decompression is requested. EXAM: PERCUTANEOUS TRANSHEPATIC CHOLANGIOGRAM PERCUTANEOUS INTERNAL/EXTERNAL BILIARY DRAIN MEDICATIONS: Patient was already receiving adequate prophylactic antibiotic coverage. ANESTHESIA/SEDATION: Intravenous Fentanyl and Versed were administered as conscious sedation during continuous monitoring of the patient's level of consciousness and physiological / cardiorespiratory status by the radiology RN, with a total moderate sedation time of 23 minutes. PROCEDURE: Informed written consent was obtained from the patient after a thorough discussion of the procedural risks, benefits and alternatives. All questions were addressed. Maximal Sterile Barrier Technique was utilized including caps, mask, sterile gowns, sterile gloves, sterile drape, hand hygiene and skin antiseptic. A timeout was performed prior to the initiation of the procedure. Limited ultrasound confirmed intrahepatic biliary ductal dilatation in the left lobe. An appropriate skin entry site was determined. Under real-time ultrasound guidance, a 21 gauge needle advanced into a dilated peripheral left lateral segment duct. Bowel spontaneously returned through the needle hub. A 018 guidewire advanced centrally easily. A 6 French transitional dilator was advanced over  the wire. Contrast injection for percutaneous transhepatic cholangiogram was performed. This confirmed placement within the central intrahepatic biliary tree. There is intra and extrahepatic biliary ductal dilatation. Contrast did flow across the anastomosis at the hepaticojejunostomy, with dilatation of the bowel. The dilator was exchanged over a Bentson wire for a 5 [place] Kumpe catheter, advanced into the jejunal loop. Catheter exchanged over an Amplatz wire for vascular dilator which facilitated advancement of a 10 [place] internal external biliary drain catheter, placed with sideholes spanning the intrahepatic biliary tree, extending into the anastomosed jejunal loop. Contrast injection confirmed good position and patency. The catheter was secured externally with 0 Prolene suture and StatLock and placed to external drainage. The patient tolerated the procedure well. FLUOROSCOPY TIME:  2 minutes 54 seconds; 47 mGy COMPLICATIONS: None immediate IMPRESSION: 1. Percutaneous transhepatic cholangiogram demonstrates intra and extrahepatic biliary ductal dilatation with patency of the hepaticojejunostomy anastomosis. 2. Technically successful internal/external biliary drain catheter placement as above. PLAN: Allow biliary decompression with internal /external drainage. Follow bilirubin. We can do a follow-up cholangiogram next week to assess the level of obstruction, possible brush biopsy. Electronically Signed   By: Lucrezia Europe M.D.   On: 12/05/2017 17:36   Ir Exchange Biliary Drain  Result Date: 12/09/2017 INDICATION: Status post Whipple procedure, biliary obstruction EXAM: CHOLANGIOGRAM THROUGH EXISTING CATHETER TRANSCATHETER BREAST BIOPSY OF THE BILE DUCT ANASTOMOSIS REPLACEMENT OF THE LEFT 10 FRENCH INTERNAL EXTERNAL BILIARY DRAIN MEDICATIONS: Patient is inpatient.  IV antibiotics are currently infusion. ANESTHESIA/SEDATION: Moderate (conscious) sedation was employed during this procedure. A total of Versed 2.0  mg and Fentanyl 100 mcg was administered intravenously. Moderate Sedation Time: 30 minutes. The patient's level of consciousness and vital signs were monitored continuously by radiology nursing throughout the procedure under my direct supervision. FLUOROSCOPY TIME:  Fluoroscopy Time: 10 minutes 54 seconds (564 mGy). COMPLICATIONS: None immediate. PROCEDURE: Informed written consent was obtained from the patient after a thorough discussion of the procedural risks, benefits and alternatives. All questions were addressed. Maximal Sterile Barrier Technique was utilized including caps, mask, sterile gowns, sterile gloves, sterile drape, hand hygiene and skin antiseptic. A timeout was performed prior to the initiation of the procedure. Under sterile conditions and local anesthesia, the existing left internal external biliary drain was injected with contrast for cholangiogram. Cholangiogram: This demonstrates improvement in the diffuse biliary dilatation extending to the bile duct anastomosis  to the afferent loop. Catheter was cut and removed. 8 French sheath advanced over Amplatz guidewire. Bile duct anastomosis brushings: Alongside the Amplatz guidewire, bile duct brush biopsy performed 3 times through the 8 French sheath. Images obtained for documentation. Sample sent for cytology. The Biliary drain replacement: Ten [place] internal external biliary drain replaced over the Amplatz guidewire. Retention loop formed in the afferent loop across the bile duct anastomosis. Ducts were decompressed by syringe aspiration. Images obtained for documentation. Catheter secured with a Prolene suture. Patient tolerated the procedure well. No immediate complication. IMPRESSION: Initial cholangiogram demonstrates improvement in biliary dilatation following drainage. There is a mild bile duct anastomosis stricture noted during the cholangiogram, contrast does pass through the bile duct anastomosis around the biliary drain into the  afferent loop. The afferent loop remains dilated and contrast does not drain down stream within the small bowel. 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 Successful bile duct anastomosis brush biopsy for cytology Replacement of the left internal external 10 [place] biliary drain. Electronically Signed   By: Jerilynn Mages.  Shick M.D.   On: 12/09/2017 13:49   Ir Endoluminal Bx Of Biliary Tree  Result Date: 12/09/2017 INDICATION: Status post Whipple procedure, biliary obstruction EXAM: CHOLANGIOGRAM THROUGH EXISTING CATHETER TRANSCATHETER BREAST BIOPSY OF THE BILE DUCT ANASTOMOSIS REPLACEMENT OF THE LEFT 10 FRENCH INTERNAL EXTERNAL BILIARY DRAIN MEDICATIONS: Patient is inpatient.  IV antibiotics are currently infusion. ANESTHESIA/SEDATION: Moderate (conscious) sedation was employed during this procedure. A total of Versed 2.0 mg and Fentanyl 100 mcg was administered intravenously. Moderate Sedation Time: 30 minutes. The patient's level of consciousness and vital signs were monitored continuously by radiology nursing throughout the procedure under my direct supervision. FLUOROSCOPY TIME:  Fluoroscopy Time: 10 minutes 54 seconds (564 mGy). COMPLICATIONS: None immediate. PROCEDURE: Informed written consent was obtained from the patient after a thorough discussion of the procedural risks, benefits and alternatives. All questions were addressed. Maximal Sterile Barrier Technique was utilized including caps, mask, sterile gowns, sterile gloves, sterile drape, hand hygiene and skin antiseptic. A timeout was performed prior to the initiation of the procedure. Under sterile conditions and local anesthesia, the existing left internal external biliary drain was injected with contrast for cholangiogram. Cholangiogram: This demonstrates improvement in the diffuse biliary dilatation extending to the bile duct anastomosis to the afferent loop. Catheter was cut and removed. 8 French sheath advanced over Amplatz guidewire. Bile  duct anastomosis brushings: Alongside the Amplatz guidewire, bile duct brush biopsy performed 3 times through the 8 French sheath. Images obtained for documentation. Sample sent for cytology. The Biliary drain replacement: Ten [place] internal external biliary drain replaced over the Amplatz guidewire. Retention loop formed in the afferent loop across the bile duct anastomosis. Ducts were decompressed by syringe aspiration. Images obtained for documentation. Catheter secured with a Prolene suture. Patient tolerated the procedure well. No immediate complication. IMPRESSION: Initial cholangiogram demonstrates improvement in biliary dilatation following drainage. 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 The afferent loop remains dilated and contrast does not drain down stream within the small bowel. 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 Successful bile duct anastomosis brush biopsy for cytology Replacement of the left internal external 10 [place] biliary drain. Electronically Signed   By: Jerilynn Mages.  Shick M.D.   On: 12/09/2017 13:49   Ir Radiologist Eval & Mgmt  Result Date: 12/18/2017 Please refer to notes tab for details about  interventional procedure. (Op Note)  US Abdomen Limited Ruq  Result Date: 12/02/2017 CLINICAL DATA:  Abdominal pain.  History of pancreatic carcinoma EXAM: ULTRASOUND ABDOMEN LIMITED RIGHT UPPER QUADRANT COMPARISON:  CT abdomen and pelvis November 18, 2017 FINDINGS: Gallbladder: Surgically absent. Common bile duct: Diameter: 11 mm, prominent. No biliary duct mass or calculus is appreciable by ultrasound. Liver: No focal lesion identified. Liver echogenicity overall is increased. There is intrahepatic biliary duct dilatation. Portal vein is patent on color Doppler imaging with normal direction of blood flow towards the liver. IMPRESSION: 1.  Gallbladder absent. There is biliary duct dilatation, similar to recent CT. No biliary duct mass or calculus evident. 2. Diffuse increase in liver echogenicity, a finding felt to be indicative of hepatic steatosis. While no focal liver lesions are evident, it must be cautioned that the sensitivity of ultrasound for detection of focal liver lesions is diminished significantly in this circumstance. Electronically Signed   By: Lowella Grip III M.D.   On: 12/02/2017 14:19    Micro Results    No results found for this or any previous visit (from the past 240 hour(s)).     Today   Subjective    [name] today has no sob, no chest pains, her weakness is resolved and at baseline. She wants to go home today. Husband at bedisde   Patient has been seen and examined prior to discharge   Objective   Blood pressure 111/82, pulse 80, temperature 97.6 F (36.4 C), temperature source Oral, resp. rate 20, height 5\' 2"  (1.575 m), weight 62.3 kg (137 lb 4.8 oz), SpO2 98 %.   Intake/Output Summary (Last 24 hours) at 12/20/2017 1332 Last data filed at 12/20/2017 1118 Gross per 24 hour  Intake 1164.5 ml  Output 1015 ml  Net 149.5 ml    Exam Gen:- Awake   HEENT:- [place].AT, MMM Neck-Supple Neck,No JVD,  Lungs- CTA CV- S1, S2 normal Abd-  +ve B.Sounds, Abd Soft, No tenderness,    Extremity/Skin:- NAD   Data Review   CBC w Diff:  Lab Results  Component Value Date   WBC 6.3 12/20/2017   HGB 11.5 (L) 12/20/2017   HGB 11.6 06/03/2017   HCT 33.8 (L) 12/20/2017   HCT 34.6 (L) 06/03/2017   PLT 248 12/20/2017   PLT 241 12/02/2017   PLT 164 06/03/2017   LYMPHOPCT 34 12/20/2017   LYMPHOPCT 38.3 06/03/2017   MONOPCT 5 12/20/2017   MONOPCT 8.0 06/03/2017   EOSPCT 5 12/20/2017   EOSPCT 3.7 06/03/2017   BASOPCT 1 12/20/2017   BASOPCT 1.0 06/03/2017    CMP:  Lab Results  Component Value Date   NA 130 (L) 12/20/2017   NA 138 06/03/2017   K 3.7 12/20/2017   K 4.0 06/03/2017   CL  101 12/20/2017   CO2 19 (L) 12/20/2017   CO2 24 06/03/2017   BUN 21 (H) 12/20/2017   BUN 13.0 06/03/2017   CREATININE 0.84 12/20/2017   CREATININE 0.76 12/02/2017   CREATININE 0.8 06/03/2017   PROT 6.9 12/20/2017   PROT 6.9 06/03/2017   ALBUMIN 3.4 (L) 12/20/2017   ALBUMIN 3.6 06/03/2017   BILITOT 1.2 12/20/2017   BILITOT 3.4 (H) 12/02/2017   BILITOT 0.56 06/03/2017   ALKPHOS 250 (H) 12/20/2017   ALKPHOS 142 06/03/2017   AST 57 (H) 12/20/2017   AST 114 (H) 12/02/2017   AST 55 (H) 06/03/2017   ALT 41 12/20/2017   ALT 60 (H) 12/02/2017   ALT 38 06/03/2017  .  Total Discharge time is about 33 minutes  Benito Mccreedy M.D on 12/20/2017 at 1:32 PM  [facility]   Office  [phone]  Dragon dictation system was used to create this note, attempts have been made to correct errors, however presence of uncorrected errors is not a reflection quality of care provided

## 2017-12-20 NOTE — Progress Notes (Signed)
Patient received discharge information and acknowledged understanding of it. Patient's port was deaccessed by IV team.

## 2017-12-21 NOTE — Discharge Summary (Signed)
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 appearance of the kidneys. Stomach/Bowel: The stomach and the remaining small bowel loops are unremarkable. Dilatation of the hepaticojejunostomy loop is identified. There appears to be marked narrowing of the hepaticojejunostomy loop at the point at which the crosses the midline (from right to left) between the aorta and SMV, image [id number]. Findings may reflect underlying postsurgical scarring. Recurrent tumor not excluded. Vascular/Lymphatic:  Normal appearance of the abdominal aorta. Other:  No ascites or focal fluid collections identified. Musculoskeletal: No suspicious bone lesions identified. IMPRESSION: 1. Postoperative changes from Whipple procedure again noted. On today's study there is marked and progressive dilatation of the hepaticojejunostomy loop, bile ducts and main pancreatic duct. Focal area of narrowing of the hepaticojejunostomy loop as it crosses the midline from right to left. Findings may represent underlying postsurgical scarring and stricture formation versus  recurrent tumor. 2. There is a focal area of abnormal signal in the region of the caudate lobe of liver with increased enhancement. It is unclear whether not this is abnormal signal and enhancement within the liver parenchyma or adenopathy. A PET-CT may be helpful to assess for recurrent metabolically active tumor. Electronically Signed   By: Kerby Moors M.D.   On: 12/03/2017 08:37   Ir Int Lianne Cure Biliary Drain With Cholangiogram  Result Date: 12/05/2017 INDICATION: Pancreatic carcinoma status post Whipple, now with epigastric pain and dilatation of the hepaticojejunostomy loop and bile ducts. Elevated bilirubin. Decompression is requested. EXAM: PERCUTANEOUS TRANSHEPATIC CHOLANGIOGRAM PERCUTANEOUS INTERNAL/EXTERNAL BILIARY DRAIN MEDICATIONS: Patient was already receiving adequate prophylactic antibiotic coverage. ANESTHESIA/SEDATION: Intravenous Fentanyl and Versed were administered as conscious sedation during continuous monitoring of the patient's level of consciousness and physiological / cardiorespiratory status by the radiology RN, with a total moderate sedation time of 23 minutes.  PROCEDURE: Informed written consent was obtained from the patient after a thorough discussion of the procedural risks, benefits and alternatives. All questions were addressed. Maximal Sterile Barrier Technique was utilized including caps, mask, sterile gowns, sterile gloves, sterile drape, hand hygiene and skin antiseptic. A timeout was performed prior to the initiation of the procedure. Limited ultrasound confirmed intrahepatic biliary ductal dilatation in the left lobe. An appropriate skin entry site was determined. Under real-time ultrasound guidance, a 21 gauge needle advanced into a dilated peripheral left lateral segment duct. Bowel spontaneously returned through the needle hub. A 018 guidewire advanced centrally easily. A 6 French transitional dilator was advanced over the wire. Contrast injection for percutaneous  transhepatic cholangiogram was performed. This confirmed placement within the central intrahepatic biliary tree. There is intra and extrahepatic biliary ductal dilatation. Contrast did flow across the anastomosis at the hepaticojejunostomy, with dilatation of the bowel. The dilator was exchanged over a Bentson wire for a 5 [place] Kumpe catheter, advanced into the jejunal loop. Catheter exchanged over an Amplatz wire for vascular dilator which facilitated advancement of a 10 [place] internal external biliary drain catheter, placed with sideholes spanning the intrahepatic biliary tree, extending into the anastomosed jejunal loop. Contrast injection confirmed good position and patency. The catheter was secured externally with 0 Prolene suture and StatLock and placed to external drainage. The patient tolerated the procedure well. FLUOROSCOPY TIME:  2 minutes 54 seconds; 47 mGy COMPLICATIONS: None immediate IMPRESSION: 1. Percutaneous transhepatic cholangiogram demonstrates intra and extrahepatic biliary ductal dilatation with patency of the hepaticojejunostomy anastomosis. 2. Technically successful internal/external biliary drain catheter placement as above. PLAN: Allow biliary decompression with internal /external drainage. Follow bilirubin. We can do a follow-up cholangiogram next week to assess the level of obstruction, possible brush biopsy. Electronically Signed   By: Lucrezia Europe M.D.   On: 12/05/2017 17:36   Ir Exchange Biliary Drain  Result Date: 12/09/2017 INDICATION: Status post Whipple procedure, biliary obstruction EXAM: CHOLANGIOGRAM THROUGH EXISTING CATHETER TRANSCATHETER BREAST BIOPSY OF THE BILE DUCT ANASTOMOSIS REPLACEMENT OF THE LEFT 10 FRENCH INTERNAL EXTERNAL BILIARY DRAIN MEDICATIONS: Patient is inpatient.  IV antibiotics are currently infusion. ANESTHESIA/SEDATION: Moderate (conscious) sedation was employed during this procedure. A total of Versed 2.0 mg and Fentanyl 100 mcg was administered  intravenously. Moderate Sedation Time: 30 minutes. The patient's level of consciousness and vital signs were monitored continuously by radiology nursing throughout the procedure under my direct supervision. FLUOROSCOPY TIME:  Fluoroscopy Time: 10 minutes 54 seconds (564 mGy). COMPLICATIONS: None immediate. PROCEDURE: Informed written consent was obtained from the patient after a thorough discussion of the procedural risks, benefits and alternatives. All questions were addressed. Maximal Sterile Barrier Technique was utilized including caps, mask, sterile gowns, sterile gloves, sterile drape, hand hygiene and skin antiseptic. A timeout was performed prior to the initiation of the procedure. Under sterile conditions and local anesthesia, the existing left internal external biliary drain was injected with contrast for cholangiogram. Cholangiogram: This demonstrates improvement in the diffuse biliary dilatation extending to the bile duct anastomosis to the afferent loop. Catheter was cut and removed. 8 French sheath advanced over Amplatz guidewire. Bile duct anastomosis brushings: Alongside the Amplatz guidewire, bile duct brush biopsy performed 3 times through the 8 French sheath. Images obtained for documentation. Sample sent for cytology. The Biliary drain replacement: Ten [place] internal external biliary drain replaced over the Amplatz guidewire. Retention loop formed in the afferent loop across the bile duct anastomosis. Ducts were decompressed by syringe aspiration. Images obtained for documentation.  Catheter secured with a Prolene suture. Patient tolerated the procedure well. No immediate complication. IMPRESSION: Initial cholangiogram demonstrates improvement in biliary dilatation following drainage. 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 The afferent loop remains dilated  and contrast does not drain down stream within the small bowel. 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 Successful bile duct anastomosis brush biopsy for cytology Replacement of the left internal external 10 [place] biliary drain. Electronically Signed   By: Jerilynn Mages.  Shick M.D.   On: 12/09/2017 13:49   Ir Endoluminal Bx Of Biliary Tree  Result Date: 12/09/2017 INDICATION: Status post Whipple procedure, biliary obstruction EXAM: CHOLANGIOGRAM THROUGH EXISTING CATHETER TRANSCATHETER BREAST BIOPSY OF THE BILE DUCT ANASTOMOSIS REPLACEMENT OF THE LEFT 10 FRENCH INTERNAL EXTERNAL BILIARY DRAIN MEDICATIONS: Patient is inpatient.  IV antibiotics are currently infusion. ANESTHESIA/SEDATION: Moderate (conscious) sedation was employed during this procedure. A total of Versed 2.0 mg and Fentanyl 100 mcg was administered intravenously. Moderate Sedation Time: 30 minutes. The patient's level of consciousness and vital signs were monitored continuously by radiology nursing throughout the procedure under my direct supervision. FLUOROSCOPY TIME:  Fluoroscopy Time: 10 minutes 54 seconds (564 mGy). COMPLICATIONS: None immediate. PROCEDURE: Informed written consent was obtained from the patient after a thorough discussion of the procedural risks, benefits and alternatives. All questions were addressed. Maximal Sterile Barrier Technique was utilized including caps, mask, sterile gowns, sterile gloves, sterile drape, hand hygiene and skin antiseptic. A timeout was performed prior to the initiation of the procedure. Under sterile conditions and local anesthesia, the existing left internal external biliary drain was injected with contrast for cholangiogram. Cholangiogram: This demonstrates improvement in the diffuse biliary dilatation extending to the bile duct anastomosis to the afferent loop. Catheter was cut and removed. 8 French sheath advanced over Amplatz guidewire. Bile duct anastomosis brushings: Alongside the Amplatz  guidewire, bile duct brush biopsy performed 3 times through the 8 French sheath. Images obtained for documentation. Sample sent for cytology. The Biliary drain replacement: Ten [place] internal external biliary drain replaced over the Amplatz guidewire. Retention loop formed in the afferent loop across the bile duct anastomosis. Ducts were decompressed by syringe aspiration. Images obtained for documentation. Catheter secured with a Prolene suture. Patient tolerated the procedure well. No immediate complication. IMPRESSION: Initial cholangiogram demonstrates improvement in biliary dilatation following drainage. 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 The afferent loop remains dilated and contrast does not drain down stream within the small bowel. 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 Successful bile duct anastomosis brush biopsy for cytology Replacement of the left internal external 10 [place] biliary drain. Electronically Signed   By: Jerilynn Mages.  Shick M.D.   On: 12/09/2017 13:49   Ir Radiologist Eval & Mgmt  Result Date: 12/18/2017 Please refer to notes tab for details about interventional procedure. (Op Note)  US Abdomen Limited Ruq  Result Date: 12/02/2017 CLINICAL DATA:  Abdominal pain.  History of pancreatic carcinoma EXAM: ULTRASOUND ABDOMEN LIMITED RIGHT UPPER QUADRANT COMPARISON:  CT abdomen and pelvis November 18, 2017 FINDINGS: Gallbladder: Surgically absent. Common bile duct: Diameter: 11 mm, prominent. No biliary duct mass or calculus is appreciable by ultrasound. Liver: No focal lesion identified. Liver echogenicity overall is increased. There is intrahepatic biliary duct dilatation. Portal vein is patent on  color Doppler imaging with normal direction of blood flow towards the liver. IMPRESSION: 1. Gallbladder absent. There is biliary duct  dilatation, similar to recent CT. No biliary duct mass or calculus evident. 2. Diffuse increase in liver echogenicity, a finding felt to be indicative of hepatic steatosis. While no focal liver lesions are evident, it must be cautioned that the sensitivity of ultrasound for detection of focal liver lesions is diminished significantly in this circumstance. Electronically Signed   By: Lowella Grip III M.D.   On: 12/02/2017 14:19    Micro Results    No results found for this or any previous visit (from the past 240 hour(s)).     Today   Subjective    Georgann Bramble today has no sob, no chest pains, her weakness is resolved and at baseline. She wants to go home today. Husband at bedisde   Patient has been seen and examined prior to discharge   Objective   Blood pressure 111/82, pulse 80, temperature 97.6 F (36.4 C), temperature source Oral, resp. rate 20, height 5\' 2"  (1.575 m), weight 62.3 kg (137 lb 4.8 oz), SpO2 98 %.   Intake/Output Summary (Last 24 hours) at 12/20/2017 1332 Last data filed at 12/20/2017 1118 Gross per 24 hour  Intake 1164.5 ml  Output 1015 ml  Net 149.5 ml    Exam Gen:- Awake   HEENT:- .AT, MMM Neck-Supple Neck,No JVD,  Lungs- CTA CV- S1, S2 normal Abd-  +ve B.Sounds, Abd Soft, No tenderness,    Extremity/Skin:- NAD   Data Review   CBC w Diff:  Lab Results  Component Value Date   WBC 6.3 12/20/2017   HGB 11.5 (L) 12/20/2017   HGB 11.6 06/03/2017   HCT 33.8 (L) 12/20/2017   HCT 34.6 (L) 06/03/2017   PLT 248 12/20/2017   PLT 241 12/02/2017   PLT 164 06/03/2017   LYMPHOPCT 34 12/20/2017   LYMPHOPCT 38.3 06/03/2017   MONOPCT 5 12/20/2017   MONOPCT 8.0 06/03/2017   EOSPCT 5 12/20/2017   EOSPCT 3.7 06/03/2017   BASOPCT 1 12/20/2017   BASOPCT 1.0 06/03/2017    CMP:  Lab Results  Component Value Date   NA 130 (L) 12/20/2017   NA 138 06/03/2017   K 3.7 12/20/2017   K 4.0 06/03/2017   CL 101 12/20/2017   CO2 19 (L) 12/20/2017    CO2 24 06/03/2017   BUN 21 (H) 12/20/2017   BUN 13.0 06/03/2017   CREATININE 0.84 12/20/2017   CREATININE 0.76 12/02/2017   CREATININE 0.8 06/03/2017   PROT 6.9 12/20/2017   PROT 6.9 06/03/2017   ALBUMIN 3.4 (L) 12/20/2017   ALBUMIN 3.6 06/03/2017   BILITOT 1.2 12/20/2017   BILITOT 3.4 (H) 12/02/2017   BILITOT 0.56 06/03/2017   ALKPHOS 250 (H) 12/20/2017   ALKPHOS 142 06/03/2017   AST 57 (H) 12/20/2017   AST 114 (H) 12/02/2017   AST 55 (H) 06/03/2017   ALT 41 12/20/2017   ALT 60 (H) 12/02/2017   ALT 38 06/03/2017  .   Total Discharge time is about 33 minutes  Benito Mccreedy M.D on 12/20/2017 at 1:32 PM  [facility]   Office  [phone]  Dragon dictation system was used to create this note, attempts have been made to correct errors, however presence of uncorrected errors is not a reflection quality of care provided

## 2017-12-22 ENCOUNTER — Telehealth: Payer: Self-pay | Admitting: *Deleted

## 2017-12-22 NOTE — Telephone Encounter (Signed)
-----   Message from Liliane Shi, PA-C sent at 12/21/2017  7:46 AM EDT ----- BNP normal. Richardson Dopp, PA-C    12/21/2017 7:46 AM

## 2017-12-22 NOTE — Telephone Encounter (Signed)
Pt has been notified of lab results by phone with verbal understanding. Pt thanked me for my call. Pt is seeing Pulmonology 12/25/17.

## 2017-12-24 ENCOUNTER — Inpatient Hospital Stay: Payer: Medicare Other | Attending: Oncology

## 2017-12-24 ENCOUNTER — Telehealth: Payer: Self-pay | Admitting: Oncology

## 2017-12-24 ENCOUNTER — Inpatient Hospital Stay (HOSPITAL_BASED_OUTPATIENT_CLINIC_OR_DEPARTMENT_OTHER): Payer: Medicare Other | Admitting: Oncology

## 2017-12-24 ENCOUNTER — Inpatient Hospital Stay: Payer: Medicare Other

## 2017-12-24 ENCOUNTER — Ambulatory Visit: Payer: Medicare Other | Admitting: Physician Assistant

## 2017-12-24 VITALS — BP 113/71 | HR 73 | Temp 97.7°F | Resp 18 | Ht 62.0 in | Wt 140.0 lb

## 2017-12-24 DIAGNOSIS — Z8542 Personal history of malignant neoplasm of other parts of uterus: Secondary | ICD-10-CM | POA: Diagnosis not present

## 2017-12-24 DIAGNOSIS — C541 Malignant neoplasm of endometrium: Secondary | ICD-10-CM

## 2017-12-24 DIAGNOSIS — Z452 Encounter for adjustment and management of vascular access device: Secondary | ICD-10-CM | POA: Insufficient documentation

## 2017-12-24 DIAGNOSIS — I959 Hypotension, unspecified: Secondary | ICD-10-CM | POA: Insufficient documentation

## 2017-12-24 DIAGNOSIS — C250 Malignant neoplasm of head of pancreas: Secondary | ICD-10-CM

## 2017-12-24 DIAGNOSIS — R509 Fever, unspecified: Secondary | ICD-10-CM | POA: Insufficient documentation

## 2017-12-24 DIAGNOSIS — Z8507 Personal history of malignant neoplasm of pancreas: Secondary | ICD-10-CM | POA: Diagnosis present

## 2017-12-24 DIAGNOSIS — Z95828 Presence of other vascular implants and grafts: Secondary | ICD-10-CM

## 2017-12-24 LAB — CMP (CANCER CENTER ONLY)
ALK PHOS: 337 U/L — AB (ref 40–150)
ALT: 29 U/L (ref 0–55)
AST: 38 U/L — ABNORMAL HIGH (ref 5–34)
Albumin: 3.4 g/dL — ABNORMAL LOW (ref 3.5–5.0)
Anion gap: 8 (ref 3–11)
BUN: 12 mg/dL (ref 7–26)
CO2: 24 mmol/L (ref 22–29)
CREATININE: 0.72 mg/dL (ref 0.60–1.10)
Calcium: 9.6 mg/dL (ref 8.4–10.4)
Chloride: 100 mmol/L (ref 98–109)
Glucose, Bld: 111 mg/dL (ref 70–140)
Potassium: 3.8 mmol/L (ref 3.5–5.1)
Sodium: 132 mmol/L — ABNORMAL LOW (ref 136–145)
Total Bilirubin: 0.9 mg/dL (ref 0.2–1.2)
Total Protein: 7.2 g/dL (ref 6.4–8.3)

## 2017-12-24 LAB — CBC WITH DIFFERENTIAL (CANCER CENTER ONLY)
Basophils Absolute: 0 10*3/uL (ref 0.0–0.1)
Basophils Relative: 1 %
EOS ABS: 0.2 10*3/uL (ref 0.0–0.5)
EOS PCT: 5 %
HCT: 31.2 % — ABNORMAL LOW (ref 34.8–46.6)
HEMOGLOBIN: 10.9 g/dL — AB (ref 11.6–15.9)
LYMPHS ABS: 1.6 10*3/uL (ref 0.9–3.3)
Lymphocytes Relative: 31 %
MCH: 29.6 pg (ref 25.1–34.0)
MCHC: 34.9 g/dL (ref 31.5–36.0)
MCV: 84.8 fL (ref 79.5–101.0)
MONOS PCT: 10 %
Monocytes Absolute: 0.5 10*3/uL (ref 0.1–0.9)
NEUTROS PCT: 53 %
Neutro Abs: 2.7 10*3/uL (ref 1.5–6.5)
Platelet Count: 235 10*3/uL (ref 145–400)
RBC: 3.68 MIL/uL — AB (ref 3.70–5.45)
RDW: 14 % (ref 11.2–14.5)
WBC: 5.1 10*3/uL (ref 3.9–10.3)

## 2017-12-24 MED ORDER — MORPHINE SULFATE 15 MG PO TABS: 15.0000 mg | ORAL_TABLET | ORAL | 0 refills | Status: DC | PRN

## 2017-12-24 MED ORDER — SODIUM CHLORIDE 0.9 % IJ SOLN
10.0000 mL | INTRAMUSCULAR | Status: DC | PRN
  Administered 2017-12-24: 10 mL via INTRAVENOUS
  Filled 2017-12-24: qty 10

## 2017-12-24 NOTE — Progress Notes (Signed)
[facility] OFFICE PROGRESS NOTE   Diagnosis: Pancreas cancer  INTERVAL HISTORY:   [name] returns prior to a scheduled visit.  She reports improvement in abdominal pain since the biliary drain was placed.  She continues to have intermittent pain relieved with morphine. She reports intermittent episodes of dyspnea and anxiety.  She was admitted with dyspnea on 12/19/2017.  She was noted to be orthostatic in the emergency room.  She received intravenous hydration and was discharged home on 12/20/2017.  A CT of the chest 12/19/2017 revealed no evidence of metastatic disease and no pulmonary embolism. She reports seeing Dr. Barry Dienes yesterday.  Says Dr. Barry Dienes is scheduling evaluation of the biliary drain with interventional radiology.  Objective:  Vital signs in last 24 hours:  Blood pressure 113/71, pulse 73, temperature 97.7 F (36.5 C), temperature source Oral, resp. rate 18, height 5\' 2"  (1.575 m), weight 140 lb (63.5 kg), SpO2 100 %.     Resp: Lungs clear bilaterally Cardio: Regular rate and rhythm GI: Mild tenderness in the left and mid upper abdomen, no mass, biliary drain site without evidence of infection Vascular: No leg edema    Portacath/PICC-without erythema  Lab Results:  Lab Results  Component Value Date   WBC 5.1 12/24/2017   HGB 11.5 (L) 12/20/2017   HCT 31.2 (L) 12/24/2017   MCV 84.8 12/24/2017   PLT 235 12/24/2017   NEUTROABS 2.7 12/24/2017    CMP     Component Value Date/Time   NA 132 (L) 12/24/2017 0919   NA 138 06/03/2017 0937   K 3.8 12/24/2017 0919   K 4.0 06/03/2017 0937   CL 100 12/24/2017 0919   CO2 24 12/24/2017 0919   CO2 24 06/03/2017 0937   GLUCOSE 111 12/24/2017 0919   GLUCOSE 111 06/03/2017 0937   BUN 12 12/24/2017 0919   BUN 13.0 06/03/2017 0937   CREATININE 0.72 12/24/2017 0919   CREATININE 0.8 06/03/2017 0937   CALCIUM 9.6 12/24/2017 0919   CALCIUM 9.2 06/03/2017 0937   PROT 7.2 12/24/2017 0919   PROT 6.9  06/03/2017 0937   ALBUMIN 3.4 (L) 12/24/2017 0919   ALBUMIN 3.6 06/03/2017 0937   AST 38 (H) 12/24/2017 0919   AST 55 (H) 06/03/2017 0937   ALT 29 12/24/2017 0919   ALT 38 06/03/2017 0937   ALKPHOS 337 (H) 12/24/2017 0919   ALKPHOS 142 06/03/2017 0937   BILITOT 0.9 12/24/2017 0919   BILITOT 0.56 06/03/2017 0937   GFRNONAA >60 12/24/2017 0919   GFRAA >60 12/24/2017 0919     Medications: I have reviewed the patient's current medications.   Assessment/Plan: 1. 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  Gemcitabine held 11/02/2014 due to neutropenia.  Gemcitabine 11/09/2014 dose reduced 800 mg/m.  Gemcitabine held 11/16/2014 due to neutropenia.  Gemcitabine resumed 11/23/2014 every 2 week schedule.  Cycle 6 gemcitabine 01/05/2015  Cycle 7 gemcitabine 01/18/2015  Cycle 8 gemcitabine 02/01/2015  Cycle 9 gemcitabine 02/15/2015  Cycle 10 gemcitabine 03/01/2015  Cycle 11 gemcitabine 03/15/2015  Cycle 12 gemcitabine 03/29/2015  Elevated CA 19-01 May 2016  CTs 06/17/2016-new soft tissue mass at the root of the mesentery with vascular involvement  PET 06/27/2016-hypermetabolic activity associated with soft tissue adjacent to surgical  clips in the central mesentery  Status post SBRTto the mesenteric mass completed 07/26/2016  CT abdomen/pelvis 08/23/2016 -mesenteric mass stable to slightly decreased in size.  Cycle 1 FOLFOX 09/03/2016  Cycle 2 FOLFOX 09/30/2016  Cycle 3 FOLFOX 10/14/2016   Cycle 4 FOLFOX 11/05/2016 (5-FU bolus eliminated and 5-FU pump dose reduced)  Cycle 5 FOLFOX 11/19/2016  CT abdomen/pelvis  11/28/2016-decreased size of soft tissue at the small bowel mesentery, mild asymmetric soft tissue at the left vaginal cuff  Cycle 6 FOLFOX 12/10/2016 (5-FU infusion further reduced and oxaliplatin reduced)  Cycle 7 FOLFOX 12/31/2016  Cycle 8 FOLFOX 01/21/2017   CT 02/24/2017-slight decrease in size of the mesenteric mass, resolution of soft tissue fullness at the left vaginal cuff, no evidence of disease progression  CT abdomen/pelvis 06/20/2017-stable ill-defined soft tissue at the mesenteric root, mild increased asymmetry at the left vaginal cuff, no other evidence of disease progression  Elevated CA 19-9 09/11/2018  CT abdomen/pelvis 11/18/2017- no evidence of recurrent pancreas cancer, dilated afferentloop, intrahepatic biliary dilatation   2. bile duct obstruction secondary to #1, status post an ERCP with stent placement 09/23/2015hypermetabolic soft tissue in the central mesentery, no other evidence of metastatic disease, stable mildly enlarged portal caval node  3. Admission with post ERCP pancreatitis 06/16/2014  4. History of abdominal pain secondary to #1  5. Pulmonary embolism diagnosed on a CT of the abdomen 09/16/2014  Negative lower extremity Dopplers 09/17/2014  6. Multiple orthopedic surgical procedures  7. Endometrial cancer,stage IA, grade 1 endometrioid adenocarcinoma, 18% myometrial invasion, no lymphovascular space involvement, negative washings  Status post robotic total hysterectomy and bilateral salpingo-oophorectomy 11/30/2010  Recurrent tumor left lateral vagina status post biopsy 11/24/2014 with pathology confirming adenocarcinoma with focal squamous differentiation consistent with endometrial adenocarcinoma  Staging CT scans 12/06/2014 with no evidence of local pancreatic cancer recurrence. Small fluid collection adjacent to the left adrenal gland. Severe hepatic steatosis. No evidence of local extension of endometrial carcinoma.  Carcinoma not well-defined at the vaginal cuff. 5 mm right external iliac lymph node. 3.6 mm left external iliac lymph node  Brachytherapy initiated 12/22/2014, completed 01/19/2015  CT abdomen/pelvis 07/24/2015 revealed a 3 x 4 cm soft tissue focus at the vaginal apex  PET scan 08/11/2015 revealed no mass at the vaginal apex and no evidence of metastatic disease  CT 11/28/2016-mild asymmetric soft tissue at the left vaginal cuff, resolved on CT 02/24/2017  CT abdomen/pelvis 06/20/2017-stable mild ill-defined soft tissue density in the mesenteric root. Stable mild portacaval lymphadenopathy and subcentimeter right retroperitoneal lymph nodes. Mild increased size of asymmetric soft tissue density involving the left vaginal cuff.  PET scan 07/19/2017-no suspicious hypermetabolic activity within the neck, chest, abdomen or pelvis. Specifically no evidence of residual hypermetabolic tumor in the surgical bed or at the vaginal cuff. Hypermetabolic activity in the lumbar spine at the level of the right L3-4 facet joint appears degenerative.  MRI abdomen 12/02/2017-focal area of abnormal signal in the caudate lobe of the liver-unclear etiology, dilation of the hepaticojejunostomy loop, bile ducts, and pancreatic duct-stricture formation versus recurrent tumor  8. History of atrial fibrillation-maintained on xarelto  9. Family history of multiple cancers-negative CancerNext gene panel  10. Prolonged nausea following the pancreaticoduodenectomy. Improved 10/26/2014.  11. Port-A-Cath placement 10/21/2014.  12. History of Neutropenia secondary to chemotherapy   13. Diarrhea. Question pancreatic insufficiency. Pancreatic enzyme replacement initiated 01/05/2015. Recurrent diarrhea following a course of antibiotics March 2017.  14. History of positional vertigo-resolved  15. 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 Neutropenia secondary to chemotherapy 09/18/2016.  17. Delayed nausea and diarrhea following FOLFOX. Emend added with cycle 2. Decadron prophylaxis added with cycle 3  18. Diarrhea 10/28/2016. Question related to chemotherapy. Negative C. difficile testing 10/31/2016.  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  20.  Admission 12/02/2017 with Bacteroides bacteremia  Biliary obstruction documented on CT/MRI 12/02/2017  Upper endoscopy 12/05/2017 revealed angulation of the apparent limb precluding intubation  Status post placement of a biliary drain 12/05/2017, replaced 12/09/2017  Bile duct brushings 12/09/2017-negative for malignancy  Biliary drain cholangiogram 12/18/2017- there is patency of the biliary-enteric anastomosis with no contrast traverses the afferent limb into the jejunum   Disposition: [name] has a history of recurrent pancreas cancer.  She was recently admitted with biliary sepsis and underwent placement of an external biliary drain.  She is scheduled for further evaluation and interventional radiology to see whether she is a candidate for capping the drain and placing an internal stent.  Progression of pancreas cancer has not been documented.  She will continue the current narcotic regimen.  [name] will return for an office visit in 1 month.  She will follow-up with interventional radiology and Dr. Barry Dienes in the interim.  Betsy Coder, MD  12/24/2017  12:43 PM

## 2017-12-24 NOTE — Telephone Encounter (Signed)
Scheduled appt per 4/3 los - Gave patient AVS and calender per los.  

## 2017-12-25 ENCOUNTER — Ambulatory Visit: Payer: Medicare Other | Admitting: Pulmonary Disease

## 2017-12-25 ENCOUNTER — Telehealth: Payer: Self-pay | Admitting: *Deleted

## 2017-12-25 ENCOUNTER — Encounter: Payer: Self-pay | Admitting: Pulmonary Disease

## 2017-12-25 DIAGNOSIS — R0602 Shortness of breath: Secondary | ICD-10-CM

## 2017-12-25 LAB — CANCER ANTIGEN 19-9: CA 19-9: 320 U/mL — ABNORMAL HIGH (ref 0–35)

## 2017-12-25 NOTE — Telephone Encounter (Signed)
-----   Message from Ladell Pier, MD sent at 12/25/2017  8:35 AM EDT ----- Please call patient, ca19-9 is stable, f/u as scheduled

## 2017-12-25 NOTE — Patient Instructions (Signed)
CT scan of the chest showed small nodules which are likely of no significance Ambulatory saturation to check oxygen level

## 2017-12-25 NOTE — Assessment & Plan Note (Signed)
She does not seem to have obstructive airway disease.  She did not desaturate on walking today.  Clearly she seems to have a lot of anxiety around possible recurrence of pancreatic malignancy and this is certainly playing a part in her symptoms.  Her transient hypoxia noted during cardiology visit seems to have resolved and etiology is not clear to me. Although she has a history of pulmonary embolism, she is maintained on anti-correlation and CT angiogram was negative.  I do not feel that further testing is necessary at this time She will call us as needed should breathing issues arise again

## 2017-12-25 NOTE — Telephone Encounter (Signed)
Notified pt of CA19-9 result, per MD note below. She voiced appreciation for call.
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
## 2017-12-26 ENCOUNTER — Other Ambulatory Visit (HOSPITAL_COMMUNITY): Payer: Self-pay | Admitting: General Surgery

## 2017-12-26 DIAGNOSIS — K8309 Other cholangitis: Secondary | ICD-10-CM

## 2017-12-29 ENCOUNTER — Telehealth: Payer: Self-pay | Admitting: *Deleted

## 2017-12-29 ENCOUNTER — Encounter (HOSPITAL_COMMUNITY): Payer: Self-pay | Admitting: Emergency Medicine

## 2017-12-29 ENCOUNTER — Encounter: Payer: Self-pay | Admitting: Nurse Practitioner

## 2017-12-29 ENCOUNTER — Inpatient Hospital Stay (HOSPITAL_COMMUNITY)
Admission: EM | Admit: 2017-12-29 | Discharge: 2018-01-02 | DRG: 871 | Disposition: A | Discharge status: Home Health | Payer: Medicare Other | Attending: Internal Medicine | Admitting: Internal Medicine

## 2017-12-29 ENCOUNTER — Telehealth: Payer: Self-pay | Admitting: Nurse Practitioner

## 2017-12-29 ENCOUNTER — Other Ambulatory Visit: Payer: Self-pay

## 2017-12-29 ENCOUNTER — Ambulatory Visit: Payer: Medicare Other

## 2017-12-29 ENCOUNTER — Inpatient Hospital Stay (HOSPITAL_BASED_OUTPATIENT_CLINIC_OR_DEPARTMENT_OTHER): Payer: Medicare Other | Admitting: Nurse Practitioner

## 2017-12-29 ENCOUNTER — Inpatient Hospital Stay: Payer: Medicare Other

## 2017-12-29 ENCOUNTER — Inpatient Hospital Stay (HOSPITAL_COMMUNITY): Payer: Medicare Other

## 2017-12-29 ENCOUNTER — Encounter: Payer: Self-pay | Admitting: Emergency Medicine

## 2017-12-29 ENCOUNTER — Emergency Department (HOSPITAL_COMMUNITY): Payer: Medicare Other

## 2017-12-29 VITALS — BP 86/52 | HR 72 | Temp 98.7°F | Resp 16 | Ht 62.0 in | Wt 139.3 lb

## 2017-12-29 DIAGNOSIS — Z90411 Acquired partial absence of pancreas: Secondary | ICD-10-CM

## 2017-12-29 DIAGNOSIS — I480 Paroxysmal atrial fibrillation: Secondary | ICD-10-CM | POA: Diagnosis present

## 2017-12-29 DIAGNOSIS — Z6825 Body mass index (BMI) 25.0-25.9, adult: Secondary | ICD-10-CM

## 2017-12-29 DIAGNOSIS — Z8701 Personal history of pneumonia (recurrent): Secondary | ICD-10-CM

## 2017-12-29 DIAGNOSIS — M5030 Other cervical disc degeneration, unspecified cervical region: Secondary | ICD-10-CM | POA: Diagnosis present

## 2017-12-29 DIAGNOSIS — Z882 Allergy status to sulfonamides status: Secondary | ICD-10-CM

## 2017-12-29 DIAGNOSIS — Z792 Long term (current) use of antibiotics: Secondary | ICD-10-CM

## 2017-12-29 DIAGNOSIS — I959 Hypotension, unspecified: Secondary | ICD-10-CM

## 2017-12-29 DIAGNOSIS — Z66 Do not resuscitate: Secondary | ICD-10-CM | POA: Diagnosis present

## 2017-12-29 DIAGNOSIS — K8681 Exocrine pancreatic insufficiency: Secondary | ICD-10-CM | POA: Diagnosis present

## 2017-12-29 DIAGNOSIS — T85698A Other mechanical complication of other specified internal prosthetic devices, implants and grafts, initial encounter: Secondary | ICD-10-CM | POA: Diagnosis present

## 2017-12-29 DIAGNOSIS — C250 Malignant neoplasm of head of pancreas: Secondary | ICD-10-CM

## 2017-12-29 DIAGNOSIS — R109 Unspecified abdominal pain: Secondary | ICD-10-CM | POA: Diagnosis present

## 2017-12-29 DIAGNOSIS — E43 Unspecified severe protein-calorie malnutrition: Secondary | ICD-10-CM | POA: Diagnosis present

## 2017-12-29 DIAGNOSIS — A419 Sepsis, unspecified organism: Secondary | ICD-10-CM

## 2017-12-29 DIAGNOSIS — K9189 Other postprocedural complications and disorders of digestive system: Secondary | ICD-10-CM | POA: Diagnosis present

## 2017-12-29 DIAGNOSIS — G620 Drug-induced polyneuropathy: Secondary | ICD-10-CM | POA: Diagnosis present

## 2017-12-29 DIAGNOSIS — D638 Anemia in other chronic diseases classified elsewhere: Secondary | ICD-10-CM | POA: Diagnosis not present

## 2017-12-29 DIAGNOSIS — Z8507 Personal history of malignant neoplasm of pancreas: Secondary | ICD-10-CM | POA: Diagnosis not present

## 2017-12-29 DIAGNOSIS — Z9049 Acquired absence of other specified parts of digestive tract: Secondary | ICD-10-CM

## 2017-12-29 DIAGNOSIS — R402413 Glasgow coma scale score 13-15, at hospital admission: Secondary | ICD-10-CM | POA: Diagnosis present

## 2017-12-29 DIAGNOSIS — D630 Anemia in neoplastic disease: Secondary | ICD-10-CM | POA: Diagnosis present

## 2017-12-29 DIAGNOSIS — E871 Hypo-osmolality and hyponatremia: Secondary | ICD-10-CM | POA: Diagnosis not present

## 2017-12-29 DIAGNOSIS — C801 Malignant (primary) neoplasm, unspecified: Secondary | ICD-10-CM

## 2017-12-29 DIAGNOSIS — Z8719 Personal history of other diseases of the digestive system: Secondary | ICD-10-CM

## 2017-12-29 DIAGNOSIS — Z96612 Presence of left artificial shoulder joint: Secondary | ICD-10-CM | POA: Diagnosis present

## 2017-12-29 DIAGNOSIS — I482 Chronic atrial fibrillation, unspecified: Secondary | ICD-10-CM | POA: Diagnosis present

## 2017-12-29 DIAGNOSIS — Z8542 Personal history of malignant neoplasm of other parts of uterus: Secondary | ICD-10-CM | POA: Diagnosis not present

## 2017-12-29 DIAGNOSIS — E861 Hypovolemia: Secondary | ICD-10-CM | POA: Diagnosis present

## 2017-12-29 DIAGNOSIS — Z9221 Personal history of antineoplastic chemotherapy: Secondary | ICD-10-CM

## 2017-12-29 DIAGNOSIS — I10 Essential (primary) hypertension: Secondary | ICD-10-CM | POA: Diagnosis present

## 2017-12-29 DIAGNOSIS — A4159 Other Gram-negative sepsis: Secondary | ICD-10-CM | POA: Diagnosis present

## 2017-12-29 DIAGNOSIS — E860 Dehydration: Secondary | ICD-10-CM | POA: Diagnosis present

## 2017-12-29 DIAGNOSIS — C541 Malignant neoplasm of endometrium: Secondary | ICD-10-CM

## 2017-12-29 DIAGNOSIS — Y838 Other surgical procedures as the cause of abnormal reaction of the patient, or of later complication, without mention of misadventure at the time of the procedure: Secondary | ICD-10-CM | POA: Diagnosis present

## 2017-12-29 DIAGNOSIS — K8309 Other cholangitis: Secondary | ICD-10-CM | POA: Diagnosis present

## 2017-12-29 DIAGNOSIS — F329 Major depressive disorder, single episode, unspecified: Secondary | ICD-10-CM | POA: Diagnosis present

## 2017-12-29 DIAGNOSIS — F419 Anxiety disorder, unspecified: Secondary | ICD-10-CM | POA: Diagnosis present

## 2017-12-29 DIAGNOSIS — Z888 Allergy status to other drugs, medicaments and biological substances status: Secondary | ICD-10-CM

## 2017-12-29 DIAGNOSIS — Z79899 Other long term (current) drug therapy: Secondary | ICD-10-CM

## 2017-12-29 DIAGNOSIS — D649 Anemia, unspecified: Secondary | ICD-10-CM | POA: Diagnosis not present

## 2017-12-29 DIAGNOSIS — K831 Obstruction of bile duct: Secondary | ICD-10-CM | POA: Diagnosis present

## 2017-12-29 DIAGNOSIS — R52 Pain, unspecified: Secondary | ICD-10-CM

## 2017-12-29 DIAGNOSIS — B961 Klebsiella pneumoniae [K. pneumoniae] as the cause of diseases classified elsewhere: Secondary | ICD-10-CM | POA: Diagnosis present

## 2017-12-29 DIAGNOSIS — Z981 Arthrodesis status: Secondary | ICD-10-CM

## 2017-12-29 DIAGNOSIS — A414 Sepsis due to anaerobes: Secondary | ICD-10-CM | POA: Diagnosis not present

## 2017-12-29 DIAGNOSIS — Z96653 Presence of artificial knee joint, bilateral: Secondary | ICD-10-CM | POA: Diagnosis present

## 2017-12-29 DIAGNOSIS — Z7901 Long term (current) use of anticoagulants: Secondary | ICD-10-CM

## 2017-12-29 DIAGNOSIS — K5900 Constipation, unspecified: Secondary | ICD-10-CM | POA: Diagnosis present

## 2017-12-29 DIAGNOSIS — Z95828 Presence of other vascular implants and grafts: Secondary | ICD-10-CM

## 2017-12-29 DIAGNOSIS — Y828 Other medical devices associated with adverse incidents: Secondary | ICD-10-CM | POA: Diagnosis present

## 2017-12-29 DIAGNOSIS — R7881 Bacteremia: Secondary | ICD-10-CM | POA: Diagnosis not present

## 2017-12-29 DIAGNOSIS — R509 Fever, unspecified: Secondary | ICD-10-CM | POA: Diagnosis not present

## 2017-12-29 DIAGNOSIS — Z8781 Personal history of (healed) traumatic fracture: Secondary | ICD-10-CM

## 2017-12-29 DIAGNOSIS — C259 Malignant neoplasm of pancreas, unspecified: Secondary | ICD-10-CM

## 2017-12-29 DIAGNOSIS — T85520A Displacement of bile duct prosthesis, initial encounter: Secondary | ICD-10-CM | POA: Diagnosis not present

## 2017-12-29 DIAGNOSIS — Z8619 Personal history of other infectious and parasitic diseases: Secondary | ICD-10-CM

## 2017-12-29 DIAGNOSIS — Z90710 Acquired absence of both cervix and uterus: Secondary | ICD-10-CM

## 2017-12-29 DIAGNOSIS — R6521 Severe sepsis with septic shock: Secondary | ICD-10-CM | POA: Diagnosis not present

## 2017-12-29 DIAGNOSIS — Z86711 Personal history of pulmonary embolism: Secondary | ICD-10-CM

## 2017-12-29 DIAGNOSIS — I4891 Unspecified atrial fibrillation: Secondary | ICD-10-CM | POA: Diagnosis present

## 2017-12-29 DIAGNOSIS — Z923 Personal history of irradiation: Secondary | ICD-10-CM

## 2017-12-29 LAB — CBC WITH DIFFERENTIAL (CANCER CENTER ONLY)
BASOS PCT: 0 %
Basophils Absolute: 0 10*3/uL (ref 0.0–0.1)
Eosinophils Absolute: 0 10*3/uL (ref 0.0–0.5)
Eosinophils Relative: 0 %
HEMATOCRIT: 31 % — AB (ref 34.8–46.6)
Hemoglobin: 10.9 g/dL — ABNORMAL LOW (ref 11.6–15.9)
LYMPHS PCT: 4 %
Lymphs Abs: 0.4 10*3/uL — ABNORMAL LOW (ref 0.9–3.3)
MCH: 29.8 pg (ref 25.1–34.0)
MCHC: 35.2 g/dL (ref 31.5–36.0)
MCV: 84.7 fL (ref 79.5–101.0)
MONO ABS: 0.7 10*3/uL (ref 0.1–0.9)
MONOS PCT: 7 %
NEUTROS ABS: 10 10*3/uL — AB (ref 1.5–6.5)
Neutrophils Relative %: 89 %
Platelet Count: 234 10*3/uL (ref 145–400)
RBC: 3.66 MIL/uL — ABNORMAL LOW (ref 3.70–5.45)
RDW: 14.5 % (ref 11.2–14.5)
WBC Count: 11.2 10*3/uL — ABNORMAL HIGH (ref 3.9–10.3)

## 2017-12-29 LAB — URINALYSIS, COMPLETE (UACMP) WITH MICROSCOPIC
Bacteria, UA: NONE SEEN
GLUCOSE, UA: NEGATIVE mg/dL
Hgb urine dipstick: NEGATIVE
KETONES UR: 5 mg/dL — AB
NITRITE: NEGATIVE
PH: 5 (ref 5.0–8.0)
Protein, ur: 30 mg/dL — AB
Specific Gravity, Urine: 1.019 (ref 1.005–1.030)

## 2017-12-29 LAB — LACTIC ACID, PLASMA: LACTIC ACID, VENOUS: 1 mmol/L (ref 0.5–1.9)

## 2017-12-29 LAB — CMP (CANCER CENTER ONLY)
ALT: 24 U/L (ref 0–55)
ANION GAP: 12 — AB (ref 3–11)
AST: 40 U/L — ABNORMAL HIGH (ref 5–34)
Albumin: 3.3 g/dL — ABNORMAL LOW (ref 3.5–5.0)
Alkaline Phosphatase: 441 U/L — ABNORMAL HIGH (ref 40–150)
BILIRUBIN TOTAL: 1.9 mg/dL — AB (ref 0.2–1.2)
BUN: 8 mg/dL (ref 7–26)
CO2: 21 mmol/L — ABNORMAL LOW (ref 22–29)
Calcium: 9.5 mg/dL (ref 8.4–10.4)
Chloride: 93 mmol/L — ABNORMAL LOW (ref 98–109)
Creatinine: 1.11 mg/dL — ABNORMAL HIGH (ref 0.60–1.10)
GFR, EST AFRICAN AMERICAN: 57 mL/min — AB (ref >60)
GFR, EST NON AFRICAN AMERICAN: 49 mL/min — AB (ref >60)
Glucose, Bld: 221 mg/dL — ABNORMAL HIGH (ref 70–140)
POTASSIUM: 3.6 mmol/L (ref 3.5–5.1)
Sodium: 126 mmol/L — ABNORMAL LOW (ref 136–145)
TOTAL PROTEIN: 6.7 g/dL (ref 6.4–8.3)

## 2017-12-29 LAB — PROTIME-INR
INR: 2.21
PROTHROMBIN TIME: 24.4 s — AB (ref 11.4–15.2)

## 2017-12-29 LAB — APTT: aPTT: 42 seconds — ABNORMAL HIGH (ref 24–36)

## 2017-12-29 LAB — PROCALCITONIN: Procalcitonin: 14.47 ng/mL

## 2017-12-29 LAB — I-STAT CG4 LACTIC ACID, ED: LACTIC ACID, VENOUS: 1.61 mmol/L (ref 0.5–1.9)

## 2017-12-29 LAB — MAGNESIUM: MAGNESIUM: 1.8 mg/dL (ref 1.7–2.4)

## 2017-12-29 LAB — MRSA PCR SCREENING: MRSA by PCR: NEGATIVE

## 2017-12-29 MED ORDER — ENSURE ENLIVE PO LIQD: 237.0000 mL | Freq: Three times a day (TID) | ORAL | Status: DC | PRN

## 2017-12-29 MED ORDER — SODIUM CHLORIDE 0.9 % IV SOLN
INTRAVENOUS | Status: AC
  Administered 2017-12-29 – 2017-12-31 (×3): via INTRAVENOUS

## 2017-12-29 MED ORDER — SODIUM CHLORIDE 0.9 % IV SOLN
INTRAVENOUS | Status: AC
  Administered 2017-12-29: 12:00:00 via INTRAVENOUS

## 2017-12-29 MED ORDER — POLYETHYLENE GLYCOL 3350 17 G PO PACK
17.0000 g | PACK | Freq: Every day | ORAL | Status: DC | PRN
  Administered 2018-01-01: 17 g via ORAL
  Filled 2017-12-29: qty 1

## 2017-12-29 MED ORDER — LIDOCAINE-EPINEPHRINE (PF) 2 %-1:200000 IJ SOLN
INTRAMUSCULAR | Status: AC
  Filled 2017-12-29: qty 20

## 2017-12-29 MED ORDER — HYDROCODONE-ACETAMINOPHEN 5-325 MG PO TABS: 1.0000 | ORAL_TABLET | ORAL | Status: DC | PRN

## 2017-12-29 MED ORDER — VANCOMYCIN HCL IN DEXTROSE 750-5 MG/150ML-% IV SOLN
750.0000 mg | INTRAVENOUS | Status: DC
  Administered 2017-12-29: 750 mg via INTRAVENOUS
  Filled 2017-12-29: qty 150

## 2017-12-29 MED ORDER — SORBITOL 70 % SOLN
30.0000 mL | Freq: Every day | Status: DC | PRN
  Filled 2017-12-29: qty 30

## 2017-12-29 MED ORDER — MORPHINE SULFATE 15 MG PO TABS
15.0000 mg | ORAL_TABLET | ORAL | Status: DC | PRN
  Administered 2017-12-29 – 2018-01-01 (×5): 15 mg via ORAL
  Filled 2017-12-29 (×5): qty 1

## 2017-12-29 MED ORDER — ALBUTEROL SULFATE HFA 108 (90 BASE) MCG/ACT IN AERS: 2.0000 | INHALATION_SPRAY | Freq: Four times a day (QID) | RESPIRATORY_TRACT | Status: DC | PRN

## 2017-12-29 MED ORDER — ALBUTEROL SULFATE (2.5 MG/3ML) 0.083% IN NEBU: 2.5000 mg | INHALATION_SOLUTION | RESPIRATORY_TRACT | Status: DC | PRN

## 2017-12-29 MED ORDER — SODIUM CHLORIDE 0.9 % IV SOLN
1.0000 g | Freq: Once | INTRAVENOUS | Status: DC
  Filled 2017-12-29: qty 1

## 2017-12-29 MED ORDER — SODIUM CHLORIDE 0.9 % IV SOLN
1.0000 g | Freq: Once | INTRAVENOUS | Status: AC
  Administered 2017-12-29: 1 g via INTRAVENOUS
  Filled 2017-12-29 (×2): qty 1

## 2017-12-29 MED ORDER — FLECAINIDE ACETATE 100 MG PO TABS
100.0000 mg | ORAL_TABLET | Freq: Two times a day (BID) | ORAL | Status: DC
  Administered 2017-12-29 – 2018-01-02 (×8): 100 mg via ORAL
  Filled 2017-12-29 (×8): qty 1

## 2017-12-29 MED ORDER — FLEET ENEMA 7-19 GM/118ML RE ENEM: 1.0000 | ENEMA | Freq: Once | RECTAL | Status: DC | PRN

## 2017-12-29 MED ORDER — SENNA 8.6 MG PO TABS
1.0000 | ORAL_TABLET | Freq: Two times a day (BID) | ORAL | Status: DC
  Administered 2017-12-29 – 2018-01-01 (×7): 8.6 mg via ORAL
  Filled 2017-12-29 (×8): qty 1

## 2017-12-29 MED ORDER — AMITRIPTYLINE HCL 25 MG PO TABS
50.0000 mg | ORAL_TABLET | Freq: Every day | ORAL | Status: DC
Start: 2017-12-29 — End: 2018-01-02
  Administered 2017-12-29 – 2018-01-01 (×4): 50 mg via ORAL
  Filled 2017-12-29 (×4): qty 2

## 2017-12-29 MED ORDER — SODIUM CHLORIDE 0.9 % IV SOLN
1.0000 g | Freq: Two times a day (BID) | INTRAVENOUS | Status: DC
  Administered 2017-12-30: 1 g via INTRAVENOUS
  Filled 2017-12-29 (×2): qty 1

## 2017-12-29 MED ORDER — LORAZEPAM 1 MG PO TABS: 1.0000 mg | ORAL_TABLET | Freq: Three times a day (TID) | ORAL | Status: DC | PRN

## 2017-12-29 MED ORDER — METOPROLOL SUCCINATE ER 25 MG PO TB24
12.5000 mg | ORAL_TABLET | Freq: Every day | ORAL | Status: DC
  Filled 2017-12-29: qty 1

## 2017-12-29 MED ORDER — ONDANSETRON HCL 4 MG PO TABS: 4.0000 mg | ORAL_TABLET | Freq: Four times a day (QID) | ORAL | Status: DC | PRN

## 2017-12-29 MED ORDER — FLUTICASONE PROPIONATE 50 MCG/ACT NA SUSP: 1.0000 | Freq: Two times a day (BID) | NASAL | Status: DC | PRN

## 2017-12-29 MED ORDER — ONDANSETRON HCL 4 MG/2ML IJ SOLN
4.0000 mg | Freq: Four times a day (QID) | INTRAMUSCULAR | Status: DC | PRN
  Administered 2017-12-29 – 2017-12-31 (×2): 4 mg via INTRAVENOUS
  Filled 2017-12-29 (×2): qty 2

## 2017-12-29 MED ORDER — SODIUM CHLORIDE 0.9 % IV SOLN
750.0000 mg | Freq: Once | INTRAVENOUS | Status: AC
  Administered 2017-12-29: 750 mg via INTRAVENOUS
  Filled 2017-12-29 (×2): qty 750

## 2017-12-29 MED ORDER — SODIUM CHLORIDE 0.9 % IJ SOLN
10.0000 mL | INTRAMUSCULAR | Status: DC | PRN
  Administered 2017-12-29: 10 mL via INTRAVENOUS
  Filled 2017-12-29: qty 10

## 2017-12-29 MED ORDER — ACETAMINOPHEN 500 MG PO TABS
1000.0000 mg | ORAL_TABLET | Freq: Four times a day (QID) | ORAL | Status: DC | PRN
  Administered 2017-12-29 – 2018-01-02 (×3): 1000 mg via ORAL
  Filled 2017-12-29 (×3): qty 2

## 2017-12-29 MED ORDER — PIPERACILLIN-TAZOBACTAM 3.375 G IVPB
3.3750 g | Freq: Once | INTRAVENOUS | Status: DC
  Filled 2017-12-29 (×2): qty 50

## 2017-12-29 MED ORDER — DIPHENHYDRAMINE HCL 25 MG PO CAPS
25.0000 mg | ORAL_CAPSULE | Freq: Four times a day (QID) | ORAL | Status: DC | PRN
Start: 2017-12-29 — End: 2018-01-02
  Administered 2017-12-29 – 2018-01-01 (×5): 25 mg via ORAL
  Filled 2017-12-29 (×5): qty 1

## 2017-12-29 MED ORDER — ZOLPIDEM TARTRATE 5 MG PO TABS
5.0000 mg | ORAL_TABLET | Freq: Every evening | ORAL | Status: DC | PRN
  Administered 2017-12-29 – 2018-01-01 (×4): 5 mg via ORAL
  Filled 2017-12-29 (×4): qty 1

## 2017-12-29 MED ORDER — LIDOCAINE HCL 1 % IJ SOLN
INTRAMUSCULAR | Status: AC
  Filled 2017-12-29: qty 20

## 2017-12-29 MED ORDER — SODIUM CHLORIDE 0.9 % IV BOLUS
1000.0000 mL | Freq: Once | INTRAVENOUS | Status: AC
  Administered 2017-12-29: 1000 mL via INTRAVENOUS

## 2017-12-29 NOTE — ED Notes (Signed)
Bed: WA24 Expected date:  Expected time:  Means of arrival:  Comments: Cancer center 
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
## 2017-12-29 NOTE — Progress Notes (Addendum)
[place] OFFICE PROGRESS NOTE   Diagnosis: Pancreas cancer  INTERVAL HISTORY:   Ms. Panjwani returns prior to scheduled follow-up.  She woke up about 4:00 this morning with shaking chills.  She felt "cold".  Temperature was 100.5 degrees.  She took some Tylenol and increased fluid intake.  Yesterday she noted less than typical output from the drainage catheter.  The output increased before arriving to the office today.  She denies nausea/vomiting.  No diarrhea.  She has mild burning with urination.  She has had low back pain for the past week.  She has stable intermittent shortness of breath, occasional cough.  She reports being started on amoxicillin about a week ago.  Objective:  Vital signs in last 24 hours:  Blood pressure (!) 82/58, pulse 76, temperature 98.2 F (36.8 C), resp. rate (!) 22, height 5\' 2"  (1.575 m), weight 139 lb 4.8 oz (63.2 kg).    HEENT: No thrush or ulcers. Resp: Lungs clear bilaterally. Cardio: Regular rate and rhythm. GI: Abdomen is soft with mild generalized tenderness.  No hepatomegaly.  Biliary drain site without evidence of infection. Vascular: No leg edema. Neuro: Alert and oriented. Skin: Warm and dry. Port-A-Cath nontender, without erythema.   Lab Results:  Lab Results  Component Value Date   WBC 11.2 (H) 12/29/2017   HGB 11.5 (L) 12/20/2017   HCT 31.0 (L) 12/29/2017   MCV 84.7 12/29/2017   PLT 234 12/29/2017   NEUTROABS 10.0 (H) 12/29/2017    Imaging:  No results found.  Medications: I have reviewed the patient's current medications.  Assessment/Plan: 1. 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  Gemcitabine held 11/02/2014 due to neutropenia.  Gemcitabine 11/09/2014 dose reduced 800 mg/m.  Gemcitabine held 11/16/2014 due to neutropenia.  Gemcitabine resumed 11/23/2014 every 2 week schedule.  Cycle 6 gemcitabine 01/05/2015  Cycle 7 gemcitabine 01/18/2015  Cycle 8 gemcitabine 02/01/2015  Cycle 9 gemcitabine 02/15/2015  Cycle 10 gemcitabine 03/01/2015  Cycle 11 gemcitabine 03/15/2015  Cycle 12 gemcitabine 03/29/2015  Elevated CA 19-01 May 2016  CTs 06/17/2016-new soft tissue mass at the root of the mesentery with vascular involvement  PET 06/27/2016-hypermetabolic activity associated with soft tissue adjacent to surgical clips in the central mesentery  Status post SBRTto the mesenteric mass completed 07/26/2016  CT abdomen/pelvis 08/23/2016 -mesenteric mass stable to slightly decreased in size.  Cycle 1 FOLFOX 09/03/2016  Cycle 2 FOLFOX 09/30/2016  Cycle 3 FOLFOX 10/14/2016   Cycle 4 FOLFOX 11/05/2016 (5-FU bolus eliminated and 5-FU pump dose reduced)  Cycle 5 FOLFOX 11/19/2016  CT abdomen/pelvis 11/28/2016-decreased size of soft tissue at the small bowel mesentery, mild asymmetric soft tissue at the left vaginal cuff  Cycle 6 FOLFOX 12/10/2016 (5-FU infusion further reduced and oxaliplatin reduced)  Cycle 7 FOLFOX 12/31/2016  Cycle 8 FOLFOX 01/21/2017   CT 02/24/2017-slight decrease in size of the mesenteric mass, resolution of soft tissue fullness at the left vaginal cuff, no evidence of disease progression  CT abdomen/pelvis 06/20/2017-stable ill-defined soft tissue at the mesenteric root, mild increased asymmetry at the left vaginal cuff, no other evidence of disease progression  Elevated CA 19-9 09/11/2018  CT abdomen/pelvis 11/18/2017-no evidence of recurrent pancreas cancer, dilated afferentloop, intrahepatic biliary dilatation  2. bile duct obstruction secondary to #1, status post an ERCP with stent placement  09/23/2015hypermetabolic soft tissue in the central mesentery, no other evidence of metastatic disease, stable mildly enlarged portal caval node  3. Admission with post ERCP pancreatitis 06/16/2014  4. History of abdominal pain secondary to #1  5. Pulmonary embolism diagnosed on a CT of the abdomen 09/16/2014  Negative lower extremity Dopplers 09/17/2014  6. Multiple orthopedic surgical procedures  7. Endometrial cancer,stage IA, grade 1 endometrioid adenocarcinoma, 18% myometrial invasion, no lymphovascular space involvement, negative washings  Status post robotic total hysterectomy and bilateral salpingo-oophorectomy 11/30/2010  Recurrent tumor left lateral vagina status post biopsy 11/24/2014 with pathology confirming adenocarcinoma with focal squamous differentiation consistent with endometrial adenocarcinoma  Staging CT scans 12/06/2014 with no evidence of local pancreatic cancer recurrence. Small fluid collection adjacent to the left adrenal gland. Severe hepatic steatosis. No evidence of local extension of endometrial carcinoma. Carcinoma not well-defined at the vaginal cuff. 5 mm right external iliac lymph node. 3.6 mm left external iliac lymph node  Brachytherapy initiated 12/22/2014, completed 01/19/2015  CT abdomen/pelvis 07/24/2015 revealed a 3 x 4 cm soft tissue focus at the vaginal apex  PET scan 08/11/2015 revealed no mass at the vaginal apex and no evidence of metastatic disease  CT 11/28/2016-mild asymmetric soft tissue at the left vaginal cuff, resolved on CT 02/24/2017  CT abdomen/pelvis 06/20/2017-stable mild ill-defined soft tissue density in the mesenteric root. Stable mild portacaval lymphadenopathy and subcentimeter right retroperitoneal lymph nodes. Mild increased size of asymmetric soft tissue density involving the left vaginal cuff.  PET scan 07/19/2017-no suspicious hypermetabolic activity within the neck, chest, abdomen or pelvis.  Specifically no evidence of residual hypermetabolic tumor in the surgical bed or at the vaginal cuff. Hypermetabolic activity in the lumbar spine at the level of the right L3-4 facet joint appears degenerative.  MRI abdomen 12/02/2017-focal area of abnormal signal in the caudate lobe of the liver-unclear etiology, dilation of the hepaticojejunostomy loop, bile ducts, and pancreatic duct-stricture formation versus recurrent tumor  8. History of atrial fibrillation-maintained on xarelto  9. Family history of multiple cancers-negative CancerNext gene panel  10. Prolonged nausea following the pancreaticoduodenectomy. Improved 10/26/2014.  11. Port-A-Cath placement 10/21/2014.  12. History of Neutropenia secondary to chemotherapy   13. Diarrhea. Question pancreatic insufficiency. Pancreatic enzyme replacement initiated 01/05/2015. Recurrent diarrhea following a course of antibiotics March 2017.  14. History of positional vertigo-resolved  15. 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 Neutropenia secondary to chemotherapy 09/18/2016.  17. Delayed nausea and diarrhea following FOLFOX. Emend added with cycle 2. Decadron prophylaxis added with cycle 3  18. Diarrhea 10/28/2016. Question related to chemotherapy. Negative C. difficile testing 10/31/2016.  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  20.Admission 12/02/2017 with Bacteroides bacteremia  Biliary obstruction documented on CT/MRI 12/02/2017  Upper endoscopy 12/05/2017 revealed angulation of the apparent limb precluding intubation  Status post placement of a biliary drain 12/05/2017, replaced 12/09/2017  Bile duct brushings 12/09/2017-negative for malignancy  Biliary drain cholangiogram 12/18/2017- there is patency of the biliary-enteric anastomosis with no contrast  traverses the afferent limb into the jejunum    Disposition: [name] presents with fever and shaking chills.  She is hypotensive.  Blood cultures have been obtained.  IV fluids initiated.  IV antibiotics have also been initiated with vancomycin currently infusing to be followed by Primaxin.  Dr. [name] reviewed her case with the  hospitalist who recommends evaluation in the emergency department prior to hospital admission.  Patient seen with Dr. [name].  45 minutes were spent face-to-face at today's visit with the majority of that time involved in counseling/coordination of care.  Ned Card ANP/GNP-BC   12/29/2017  12:57 PM  This was a shared visit with Ned Card.  Ms. Hoey was interviewed and examined.  She presents with a fever/chills and the bilirubin is mildly elevated.  She will be admitted with presumed biliary sepsis.  She received intravenous hydration and IV antibiotics prior to transfer to the emergency room.  Hypotension improved following intravenous hydration.  She appeared stable for transfer to the emergency room.  Julieanne Manson, MD

## 2017-12-29 NOTE — Progress Notes (Signed)
Pharmacy Antibiotic Note  [name] is a 72 y.o. female admitted on 12/29/2017 with sepsis.  Pharmacy has been consulted for Vancomycin and Meropenem dosing. First doses already given.   Plan: Vancomycin 750mg  IV q24h-start now as patient did not receive loading dose. Plan for Vancomycin levels at steady state, as indicated. Meropenem 1g IV q12h. Monitor renal function, cultures, clinical course.      Temp (24hrs), Avg:98.6 F (37 C), Min:98.2 F (36.8 C), Max:99 F (37.2 C)  Recent Labs  Lab 12/24/17 0919 12/29/17 1130 12/29/17 1625 12/29/17 2010  WBC 5.1 11.2*  --   --   CREATININE 0.72 1.11*  --   --   LATICACIDVEN  --   --  1.61 1.0    Estimated Creatinine Clearance: 41.2 mL/min (A) (by C-G formula based on SCr of 1.11 mg/dL (H)).    Allergies  Allergen Reactions  . Ace Inhibitors Cough  . Scopolamine Other (See Comments)    Dizzy, "lost control of my body", fell down and cracked a rib  . Sulfa Antibiotics Hives    Antimicrobials this admission: 4/8 >> Vancomycin >> 4/8 >> Meropenem >>  Dose adjustments this admission: --  Microbiology results: 4/8 MRSA PCR: sent  Thank you for allowing pharmacy to be a part of this patient's care.   Lindell Spar, PharmD, BCPS Pager: [phone] 12/29/2017 7:53 PM

## 2017-12-29 NOTE — Progress Notes (Signed)
Report given to ER charge nurse Erline Levine, RN. Will transport pt via wheelchair.

## 2017-12-29 NOTE — Telephone Encounter (Signed)
Pt called and reported shaking chills, had fever this am.  Wanted to know what to do.  Spoke with pt, and was informed that pt had temp of 100.5 this am.  Had shaking chills, feeling very cold.  Stated very little drainage from pouch since yesterday.   Pt had called Dr. Marlowe Aschoff office, and has not heard back from office yet.    Instructed pt to come in now to see either Dr. [name] or Lattie Haw, NP as per Dr. Gearldine Shown instructions.  Pt voiced understanding.  Schedule message sent. Pt's   Phone     [phone].
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
## 2017-12-29 NOTE — Telephone Encounter (Signed)
No 4/8 los.

## 2017-12-29 NOTE — ED Triage Notes (Signed)
Per cancer center RN, states possible infection related to obstructed biliary duct-drain has only put out 200 cc in 2 days-fever this am-urine culture and blood cultures done-work up for sepsis

## 2017-12-29 NOTE — ED Notes (Signed)
Antibiotics have been given to this patient previously at noon today

## 2017-12-29 NOTE — ED Notes (Signed)
Patient coming from cancer center-increased malaise, evaluate for possible sepsis per nurse

## 2017-12-30 ENCOUNTER — Inpatient Hospital Stay (HOSPITAL_COMMUNITY): Payer: Medicare Other

## 2017-12-30 ENCOUNTER — Encounter (HOSPITAL_COMMUNITY): Payer: Self-pay | Admitting: Interventional Radiology

## 2017-12-30 DIAGNOSIS — A414 Sepsis due to anaerobes: Secondary | ICD-10-CM

## 2017-12-30 DIAGNOSIS — R6521 Severe sepsis with septic shock: Secondary | ICD-10-CM

## 2017-12-30 DIAGNOSIS — T85520A Displacement of bile duct prosthesis, initial encounter: Secondary | ICD-10-CM

## 2017-12-30 HISTORY — PX: IR EXCHANGE BILIARY DRAIN: IMG6046

## 2017-12-30 LAB — BLOOD CULTURE ID PANEL (REFLEXED)
Acinetobacter baumannii: NOT DETECTED
CANDIDA ALBICANS: NOT DETECTED
CANDIDA KRUSEI: NOT DETECTED
Candida glabrata: NOT DETECTED
Candida parapsilosis: NOT DETECTED
Candida tropicalis: NOT DETECTED
Carbapenem resistance: NOT DETECTED
ENTEROBACTER CLOACAE COMPLEX: NOT DETECTED
ENTEROBACTERIACEAE SPECIES: DETECTED — AB
ENTEROCOCCUS SPECIES: NOT DETECTED
ESCHERICHIA COLI: NOT DETECTED
Haemophilus influenzae: NOT DETECTED
KLEBSIELLA OXYTOCA: NOT DETECTED
Klebsiella pneumoniae: DETECTED — AB
LISTERIA MONOCYTOGENES: NOT DETECTED
Neisseria meningitidis: NOT DETECTED
PSEUDOMONAS AERUGINOSA: NOT DETECTED
Proteus species: NOT DETECTED
STREPTOCOCCUS PNEUMONIAE: NOT DETECTED
STREPTOCOCCUS PYOGENES: NOT DETECTED
Serratia marcescens: NOT DETECTED
Staphylococcus aureus (BCID): NOT DETECTED
Staphylococcus species: NOT DETECTED
Streptococcus agalactiae: NOT DETECTED
Streptococcus species: NOT DETECTED

## 2017-12-30 LAB — GLUCOSE, CAPILLARY: GLUCOSE-CAPILLARY: 118 mg/dL — AB (ref 65–99)

## 2017-12-30 LAB — COMPREHENSIVE METABOLIC PANEL
ALBUMIN: 2.7 g/dL — AB (ref 3.5–5.0)
ALT: 21 U/L (ref 14–54)
ANION GAP: 8 (ref 5–15)
AST: 35 U/L (ref 15–41)
Alkaline Phosphatase: 275 U/L — ABNORMAL HIGH (ref 38–126)
BILIRUBIN TOTAL: 1.9 mg/dL — AB (ref 0.3–1.2)
BUN: 9 mg/dL (ref 6–20)
CO2: 20 mmol/L — ABNORMAL LOW (ref 22–32)
Calcium: 8.1 mg/dL — ABNORMAL LOW (ref 8.9–10.3)
Chloride: 104 mmol/L (ref 101–111)
Creatinine, Ser: 0.51 mg/dL (ref 0.44–1.00)
GFR calc Af Amer: >60 mL/min (ref >60)
GFR calc non Af Amer: >60 mL/min (ref >60)
GLUCOSE: 158 mg/dL — AB (ref 65–99)
POTASSIUM: 3.4 mmol/L — AB (ref 3.5–5.1)
SODIUM: 132 mmol/L — AB (ref 135–145)
TOTAL PROTEIN: 5.6 g/dL — AB (ref 6.5–8.1)

## 2017-12-30 LAB — CBC
HEMATOCRIT: 25.4 % — AB (ref 36.0–46.0)
Hemoglobin: 8.9 g/dL — ABNORMAL LOW (ref 12.0–15.0)
MCH: 30.3 pg (ref 26.0–34.0)
MCHC: 35 g/dL (ref 30.0–36.0)
MCV: 86.4 fL (ref 78.0–100.0)
Platelets: 165 10*3/uL (ref 150–400)
RBC: 2.94 MIL/uL — ABNORMAL LOW (ref 3.87–5.11)
RDW: 14.8 % (ref 11.5–15.5)
WBC: 8.5 10*3/uL (ref 4.0–10.5)

## 2017-12-30 LAB — PROTIME-INR
INR: 2.4
PROTHROMBIN TIME: 26 s — AB (ref 11.4–15.2)

## 2017-12-30 LAB — IRON AND TIBC
Iron: 12 ug/dL — ABNORMAL LOW (ref 28–170)
SATURATION RATIOS: 6 % — AB (ref 10.4–31.8)
TIBC: 193 ug/dL — ABNORMAL LOW (ref 250–450)
UIBC: 181 ug/dL

## 2017-12-30 LAB — LACTIC ACID, PLASMA: LACTIC ACID, VENOUS: 1.3 mmol/L (ref 0.5–1.9)

## 2017-12-30 LAB — VITAMIN B12: VITAMIN B 12: 412 pg/mL (ref 180–914)

## 2017-12-30 LAB — PROCALCITONIN: Procalcitonin: 10.66 ng/mL

## 2017-12-30 LAB — URINE CULTURE: Culture: NO GROWTH

## 2017-12-30 LAB — FERRITIN: Ferritin: 174 ng/mL (ref 11–307)

## 2017-12-30 LAB — FOLATE: Folate: 11.3 ng/mL (ref >5.9)

## 2017-12-30 MED ORDER — CHLORHEXIDINE GLUCONATE CLOTH 2 % EX PADS
6.0000 | MEDICATED_PAD | Freq: Every day | CUTANEOUS | Status: DC
  Administered 2017-12-31 – 2018-01-02 (×3): 6 via TOPICAL

## 2017-12-30 MED ORDER — IOPAMIDOL (ISOVUE-300) INJECTION 61%: 25.0000 mL | Freq: Once | INTRAVENOUS | Status: DC | PRN

## 2017-12-30 MED ORDER — LIDOCAINE HCL (PF) 1 % IJ SOLN
INTRAMUSCULAR | Status: AC
  Filled 2017-12-30: qty 30

## 2017-12-30 MED ORDER — SODIUM CHLORIDE 0.9% FLUSH
5.0000 mL | Freq: Three times a day (TID) | INTRAVENOUS | Status: DC
  Administered 2017-12-30 – 2017-12-31 (×2): 5 mL

## 2017-12-30 MED ORDER — IOPAMIDOL (ISOVUE-300) INJECTION 61%
INTRAVENOUS | Status: AC
  Filled 2017-12-30: qty 50

## 2017-12-30 MED ORDER — SODIUM CHLORIDE 0.9 % IV BOLUS
1000.0000 mL | Freq: Once | INTRAVENOUS | Status: AC
  Administered 2017-12-30: 1000 mL via INTRAVENOUS

## 2017-12-30 MED ORDER — SODIUM CHLORIDE 0.9 % IV SOLN
2.0000 g | INTRAVENOUS | Status: DC
  Administered 2017-12-30 – 2018-01-01 (×3): 2 g via INTRAVENOUS
  Filled 2017-12-30 (×3): qty 2
  Filled 2017-12-30: qty 20

## 2017-12-30 MED ORDER — SODIUM CHLORIDE 0.9% FLUSH: 10.0000 mL | INTRAVENOUS | Status: DC | PRN

## 2017-12-30 MED ORDER — FENTANYL CITRATE (PF) 100 MCG/2ML IJ SOLN
INTRAMUSCULAR | Status: AC | PRN
  Administered 2017-12-30: 50 ug via INTRAVENOUS
  Administered 2017-12-30: 25 ug via INTRAVENOUS

## 2017-12-30 MED ORDER — VANCOMYCIN HCL IN DEXTROSE 1-5 GM/200ML-% IV SOLN: 1000.0000 mg | INTRAVENOUS | Status: DC

## 2017-12-30 MED ORDER — FENTANYL CITRATE (PF) 100 MCG/2ML IJ SOLN
INTRAMUSCULAR | Status: AC
  Filled 2017-12-30: qty 4

## 2017-12-30 MED ORDER — SODIUM CHLORIDE 0.9 % IV SOLN
1.0000 g | Freq: Three times a day (TID) | INTRAVENOUS | Status: DC
  Administered 2017-12-30: 1 g via INTRAVENOUS
  Filled 2017-12-30: qty 1

## 2017-12-30 NOTE — Progress Notes (Signed)
CC:  [place]/hypotension/fever/chills  Subjective: She feels much better this AM.  She does have some leakage around the percutaneous drain site with irrigation this AM.  Drain appears to be working well.  Objective: Vital signs in last 24 hours: Temp:  [98.2 F (36.8 C)-99 F (37.2 C)] 99 F (37.2 C) (04/09 0000) Pulse Rate:  [62-93] 67 (04/09 0700) Resp:  [11-27] 18 (04/09 0700) BP: (70-110)/(34-71) 70/41 (04/09 0700) SpO2:  [90 %-100 %] 95 % (04/09 0700) Weight:  [63.2 kg (139 lb 4.8 oz)-64.1 kg (141 lb 5 oz)] 64.1 kg (141 lb 5 oz) (04/09 0600)    2145 IV Urine x 2  Drain 230 Ongoing hypotension afebrile, TM 99.3 Na better up to 132 Lactate 1.3 procalcitonin 10.66 Bilirubin stable at 1.9, AST/ALT OK H/H down with hydration WBC remains normal 8.5 INR 2.4/PTT 42 CXR - no active dz ABX - drain in place moderate stool burden   Intake/Output from previous day: 04/08 0701 - 04/09 0700 In: 2145 [I.V.:895; IV Piggyback:1250] Out: 230 [Drains:230] Intake/Output this shift: No intake/output data recorded.  General appearance: alert, cooperative and no distress GI: soft, sore around the drain site with some leakage.    Lab Results:  Recent Labs    12/29/17 1130 12/30/17 0610  WBC 11.2* 8.5  HGB  --  8.9*  HCT 31.0* 25.4*  PLT 234 165    BMET Recent Labs    12/29/17 1130 12/30/17 0610  NA 126* 132*  K 3.6 3.4*  CL 93* 104  CO2 21* 20*  GLUCOSE 221* 158*  BUN 8 9  CREATININE 1.11* 0.51  CALCIUM 9.5 8.1*   PT/INR Recent Labs    12/29/17 2010 12/30/17 0610  LABPROT 24.4* 26.0*  INR 2.21 2.40    Recent Labs  Lab 12/24/17 0919 12/29/17 1130 12/30/17 0610  AST 38* 40* 35  ALT 29 24 21   ALKPHOS 337* 441* 275*  BILITOT 0.9 1.9* 1.9*  PROT 7.2 6.7 5.6*  ALBUMIN 3.4* 3.3* 2.7*     Lipase     Component Value Date/Time   LIPASE 19 12/02/2017 1325   LIPASE 5 (L) 06/26/2017 1446     Medications: . amitriptyline  50 mg Oral QHS  .  Chlorhexidine Gluconate Cloth  6 each Topical Daily  . flecainide  100 mg Oral BID  . metoprolol succinate  12.5 mg Oral Daily  . senna  1 tablet Oral BID   . sodium chloride 100 mL/hr at 12/30/17 0214  . meropenem (MERREM) IV Stopped (12/30/17 0137)  . vancomycin Stopped (12/29/17 2204)   Anti-infectives (From admission, onward)   Start     Dose/Rate Route Frequency Ordered Stop   12/30/17 0200  meropenem (MERREM) 1 g in sodium chloride 0.9 % 100 mL IVPB     1 g 200 mL/hr over 30 Minutes Intravenous Every 12 hours 12/29/17 1833     12/29/17 2000  vancomycin (VANCOCIN) IVPB 750 mg/150 ml premix     750 mg 150 mL/hr over 60 Minutes Intravenous Every 24 hours 12/29/17 1838        Assessment/Plan Hx of endometrial cancer Hx of chemotherapy/radiation therapy Hx of AF/Hx of PE- chronic anticoagulation Xarelto Anemia Hypotension/hypertension Hx GERD/Gastritis   Sepsis - hypotension 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 [name].  - elevated LFT/CA 19-9 =>> perc transhepatic cholangiogram, percutaneous biliary internal/external drain     12/05/17;  bile duct anastomosis  brush bx x 3;Replaced 78fr int ext biliary drain- gravity drainage - negative cytology  Hyponatremia/dehydration Severe protein calorie malnutrition   FEN:  IV fluids/Clears ID:  Meropenem/Vancomycin 4/8 =>> day 2 DVT: Xarelto on hold Foley:  None Follow up:  Dr. Barry Dienes  Plan:  Agree with Abx.  I will be sure IR knows about her.          LOS: 1 day    Marl Seago 12/30/2017 [phone]

## 2017-12-30 NOTE — Evaluation (Signed)
Physical Therapy Evaluation Patient Details Name: [name] MRN: [id number] DOB: [birth year] Today's Date: 12/30/2017   History of Present Illness  72 y.o. 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 Admitted 12/29/17  with Hypotension fevers at home  . Plan  is for follow-up cholangiogram with biliary drain exchange.    Clinical Impression  The patient reports feeling a little weak. Ambulated x 300' pushing IV pole. Pt admitted with above diagnosis. Pt currently with functional limitations due to the deficits listed below (see PT Problem List).  Pt will benefit from skilled PT to increase their independence and safety with mobility to allow discharge to the venue listed below.       Follow Up Recommendations No PT follow up;Supervision/Assistance - 24 hour    Equipment Recommendations  None recommended by PT    Recommendations for Other Services       Precautions / Restrictions Precautions Precautions: Fall Precaution Comments: biliary drain on left      Mobility  Bed Mobility Overal bed mobility: Needs Assistance Bed Mobility: Sidelying to Sit;Sit to Sidelying   Sidelying to sit: Min guard     Sit to sidelying: Supervision General bed mobility comments: gentle support with pain onset  Transfers Overall transfer level: Needs assistance Equipment used: 1 person hand held assist Transfers: Sit to/from Stand Sit to Stand: Min assist         General transfer comment: steady assist to stand and stabilize  Ambulation/Gait Ambulation/Gait assistance: Min assist Ambulation Distance (Feet): 300 Feet Assistive device: 1 person hand held assist Gait  Pattern/deviations: Step-through pattern     General Gait Details: ambualted with HHA to BR then  held onto IV pole for hall distance.  BP 88/55  to 99/53 after ambulating. No dizziness.  Stairs            Wheelchair Mobility    Modified Rankin (Stroke Patients Only)       Balance                                             Pertinent Vitals/Pain Pain Assessment: 0-10 Pain Score: 4  Pain Location: abdomen Pain Descriptors / Indicators: Contraction Pain Intervention(s): Monitored during session    Home Living Family/patient expects to be discharged to:: Private residence Living Arrangements: Spouse/significant other Available Help at Discharge: Family Type of Home: House Home Access: Stairs to enter Entrance Stairs-Rails: Left Entrance Stairs-Number of Steps: 5 Home Layout: One level Home Equipment: Cane - single point;Walker - 2 wheels;Bedside commode      Prior Function Level of Independence: Independent               Hand Dominance   Dominant Hand: Right    Extremity/Trunk Assessment   Upper Extremity Assessment Upper Extremity Assessment: Defer to OT evaluation    Lower Extremity Assessment Lower Extremity Assessment: Generalized weakness    Cervical / Trunk Assessment Cervical / Trunk Assessment: Normal  Communication   Communication: No difficulties  Cognition Arousal/Alertness: Awake/alert Behavior During Therapy: WFL for tasks assessed/performed Overall Cognitive Status: Within Functional Limits for tasks assessed  General Comments      Exercises     Assessment/Plan    PT Assessment Patient needs continued PT services  PT Problem List Decreased activity tolerance;Decreased mobility;Decreased knowledge of precautions       PT Treatment Interventions DME instruction;Gait training;Functional mobility training;Stair training;Therapeutic  activities;Therapeutic exercise;Balance training;Patient/family education    PT Goals (Current goals can be found in the Care Plan section)  Acute Rehab PT Goals Patient Stated Goal: to get home PT Goal Formulation: With patient Time For Goal Achievement: 01/13/18 Potential to Achieve Goals: Good    Frequency Min 3X/week   Barriers to discharge        Co-evaluation               AM-PAC PT "6 Clicks" Daily Activity  Outcome Measure Difficulty turning over in bed (including adjusting bedclothes, sheets and blankets)?: A Little Difficulty moving from lying on back to sitting on the side of the bed? : A Little Difficulty sitting down on and standing up from a chair with arms (e.g., wheelchair, bedside commode, etc,.)?: A Lot Help needed moving to and from a bed to chair (including a wheelchair)?: A Lot Help needed walking in hospital room?: A Lot Help needed climbing 3-5 steps with a railing? : A Lot 6 Click Score: 14    End of Session   Activity Tolerance: Patient tolerated treatment well Patient left: in bed;with call bell/phone within reach;with family/visitor present Nurse Communication: Mobility status PT Visit Diagnosis: Other abnormalities of gait and mobility (R26.89)    Time: 9794-8016 PT Time Calculation (min) (ACUTE ONLY): 21 min   Charges:   PT Evaluation $PT Eval Low Complexity: 1 Low     PT G CodesTresa Endo PT [phone] }  Claretha Cooper 12/30/2017, 1:46 PM
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
## 2017-12-30 NOTE — Progress Notes (Signed)
Pharmacy Antibiotic Note  [name] is a 72 y.o. female admitted on 12/29/2017 with sepsis.  Pharmacy has been consulted for Vancomycin and Meropenem dosing. First doses already given.   Plan: 1) Due to improved renal function, change vanc from 75mg  q24 to 1g q24 2) Also change meropenem from 1g q12 to 1g q8 3) Plan for Vancomycin levels at steady state, as indicated. 4) Monitor renal function, cultures, clinical course.   Height: 5\' 2"  (157.5 cm) Weight: 141 lb 5 oz (64.1 kg) IBW/kg (Calculated) : 50.1  Temp (24hrs), Avg:98.6 F (37 C), Min:98.2 F (36.8 C), Max:99 F (37.2 C)  Recent Labs  Lab 12/24/17 0919 12/29/17 1130 12/29/17 1625 12/29/17 2010 12/30/17 0610  WBC 5.1 11.2*  --   --  8.5  CREATININE 0.72 1.11*  --   --  0.51  LATICACIDVEN  --   --  1.61 1.0 1.3    Estimated Creatinine Clearance: 57.5 mL/min (by C-G formula based on SCr of 0.51 mg/dL).    Allergies  Allergen Reactions  . Ace Inhibitors Cough  . Scopolamine Other (See Comments)    Dizzy, "lost control of my body", fell down and cracked a rib  . Sulfa Antibiotics Hives    Antimicrobials this admission: 4/8 >> Vancomycin >> 4/8 >> Meropenem >>  Microbiology results: 4/8 MRSA PCR: sent  Thank you for allowing pharmacy to be a part of this patient's care.  Adrian Saran, PharmD, BCPS Pager [phone] 12/30/2017 8:23 AM

## 2017-12-30 NOTE — Progress Notes (Signed)
Pt drain  Flushed and the liquid comes directly out of the insertion site. Its green liquid that is very simular to the fuid in the bag

## 2017-12-30 NOTE — Progress Notes (Signed)
PHARMACY - PHYSICIAN COMMUNICATION CRITICAL VALUE ALERT - BLOOD CULTURE IDENTIFICATION (BCID)  [name] is an 72 y.o. female who presented to [facility] on 12/29/2017 with a chief complaint of hypotension, fever, chills, possible sepsis  Assessment:  Bacteremia source of sepsis - klebsiella pneumonia  Name of physician (or Provider) Contacted: [place]  Current antibiotics: vancomycin and meropenem  Changes to prescribed antibiotics recommended:  Rocephin 2g IV q24   Results for orders placed or performed in visit on 12/29/17  Blood Culture ID Panel (Reflexed) (Collected: 12/29/2017 11:30 AM)  Result Value Ref Range   Enterococcus species NOT DETECTED NOT DETECTED   Listeria monocytogenes NOT DETECTED NOT DETECTED   Staphylococcus species NOT DETECTED NOT DETECTED   Staphylococcus aureus NOT DETECTED NOT DETECTED   Streptococcus species NOT DETECTED NOT DETECTED   Streptococcus agalactiae NOT DETECTED NOT DETECTED   Streptococcus pneumoniae NOT DETECTED NOT DETECTED   Streptococcus pyogenes NOT DETECTED NOT DETECTED   Acinetobacter baumannii NOT DETECTED NOT DETECTED   Enterobacteriaceae species DETECTED (A) NOT DETECTED   Enterobacter cloacae complex NOT DETECTED NOT DETECTED   Escherichia coli NOT DETECTED NOT DETECTED   Klebsiella oxytoca NOT DETECTED NOT DETECTED   Klebsiella pneumoniae DETECTED (A) NOT DETECTED   Proteus species NOT DETECTED NOT DETECTED   Serratia marcescens NOT DETECTED NOT DETECTED   Carbapenem resistance NOT DETECTED NOT DETECTED   Haemophilus influenzae NOT DETECTED NOT DETECTED   Neisseria meningitidis NOT DETECTED NOT DETECTED   Pseudomonas aeruginosa NOT DETECTED NOT DETECTED   Candida albicans NOT DETECTED NOT DETECTED   Candida glabrata NOT DETECTED NOT DETECTED   Candida krusei NOT DETECTED NOT DETECTED   Candida parapsilosis NOT DETECTED NOT DETECTED   Candida tropicalis NOT DETECTED NOT DETECTED    Kara Mead 12/30/2017   11:05 AM

## 2017-12-30 NOTE — Progress Notes (Signed)
[facility] Progress Note  Subjective: feeling better, BP's up 80's- 90's.  No c/o today.  Drain when flushed is leaking fluids around the drain.    Vitals:   12/30/17 0500 12/30/17 0600 12/30/17 0700 12/30/17 0759  BP: (!) 81/36 (!) 99/46 (!) 70/41   Pulse: 66 74 67   Resp: 19 19 18    Temp:    98.4 F (36.9 C)  TempSrc:    Oral  SpO2: 97% 100% 95%   Weight:  64.1 kg (141 lb 5 oz)    Height:  5\' 2"  (1.575 m)      Inpatient medications: . amitriptyline  50 mg Oral QHS  . Chlorhexidine Gluconate Cloth  6 each Topical Daily  . flecainide  100 mg Oral BID  . metoprolol succinate  12.5 mg Oral Daily  . senna  1 tablet Oral BID   . sodium chloride 100 mL/hr at 12/30/17 0214  . meropenem (MERREM) IV    . vancomycin     acetaminophen, albuterol, diphenhydrAMINE, feeding supplement (ENSURE ENLIVE), fluticasone, HYDROcodone-acetaminophen, LORazepam, morphine, ondansetron **OR** ondansetron (ZOFRAN) IV, polyethylene glycol, sodium chloride flush, sodium phosphate, sorbitol, zolpidem  Exam: Gen: alert, no distress Respiratory: clear to auscultation bilaterally. Normal respiratory effort. No accessory muscle use.  Cardiovascular: RRR no mrg. No extremity edema. 2+ pedal pulses. No carotid bruits.  Abdomen: Soft, some tenderness to palpation lower abdomen. Drain site dressing is green-tinged and wet from leaking of fluids. No purulent drainage.  Musculoskeletal: no clubbing / cyanosis. No joint deformity upper and lower extremities. Good ROM, no contractures. Normal muscle tone.  Skin: no rashes, lesions, ulcers. No induration Neurologic: CN 2-12 grossly intact. Sensation intact, DTR normal. Strength 5/5 in all 4.     Brief Summary: 72 y.o. 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  Patient underwent MRCP, EGD and percutaneous drain placement.  Patient stated he woke up at 4 AM day of admission with chills, rigors, fever with a temp of 100.5, dizziness, generalized weakness.  Patient stated she tried to go from one couch but still continued to have chills and riders. Pt did endorse some nausea denies any emesis.  Patient did endorse some abdominal pain around her drain site.  Patient stated she called her husband and subsequently called her oncologist who asked her to come to the office.  Patient also called general surgeon and was told to flush the  biliary drain 3 times as patient noted decreased output from her biliary drain 1225 cc over the preceding 12 hours. Patient was seen in the oncologist office noted to be hypotensive with systolic blood pressure 52/84.  Patient was pancultured given dose of IV vancomycin and meropenem.  Patient was subsequently sent to the ED.  ED Course: Patient was seen in the ED.  Labs obtained in the oncologist office included a sodium of 126, bicarb of 21, glucose of 221, creatinine 1.11, bilirubin of 1.9 otherwise was within normal limits. CBC obtained with a white count of 11.3 hemoglobin of 10.9 otherwise was within normal limits.  Lactic acid level obtained was 1.61.  Urinalysis with small leukocytes, nitrite negative, WBCs 6 through 30. On presentation to ED had blood pressure of 70/50.  Patient given IV fluids with improvement with systolic  blood pressure in the low 100s.  [facility] were called to admit the patient for further evaluation and management.   Home meds: - flecainide 100 bid/ toprol xl 12.5 qd/ xarelto 20 qd - ambien/ MSIR 15 q 4hr prn/ ativan prn/ norco prn/ elavil 50 hs - prn's / vitamins - flagyl 500 tid/ augmentin bid       Impression/Plan: Principal Problem:   Hypotension Active Problems:   Sepsis ([place])    HTN (hypertension)   Hyponatremia   Malignant neoplasm of pancreas ([facility])   Atrial fibrillation with RVR post op-    Protein-calorie malnutrition, severe (HCC)   Anemia   Anxiety   Dehydration   Paroxysmal atrial fibrillation ([facility])   Chronic atrial fibrillation ([place])   1. Hypotension/probable sepsis - pt presented hypotension, fever, chills, rigors, lightheadedness.  Pt stated her drain output had not been putting out what is usually had been doing. Suspected sepsis. WBC of 11.2.  Blood pressure responding to IV fluids. Patient received IV abx in ED and IVF's.   - BCID + for klebsiella, per AB chart recommending change to Rocephin 2 gm 24, will dc other abx - cont IV fluids.   - supportive care - general surgery is following - holding toprol xl  2.  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 Underwent perc placement of int/ ext biliary drain by IR, got course of IV abx, and dc'd to f/u w/ GI/ IR/ ONC/ Gen surg.   3. Pancreatic cancer - sp Whipple's procedure in 2015.  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 F/B Dr [name].   4.   paroxysmal atrial fibrillation - f/b cardiology. In the past she failed Multaq and was changed to flecainide for afib; ablation was offered in the past but pt wanted medical Rx - cont flecainide - holding toprol for hypotension - in NSR, HR 70's - Xarelto on hold in anticipation of  possible need for procedure  5.  Severe protein calorie malnutrition Will need nutritional supplementation.  6.  Hypertension Monitor closely on beta-blocker.  7.  Anemia Check an anemia panel.  Follow H&H.  8.  Decreased biliary tube drainage Concern for biliary tube dysfunction.  Patient has been pancultured.  Continue IV abx. Continue to hold Xarelto.  General surgery has been consulted and is following.  IR has also been consulted for biliary tube.  9.   Dehydration - cont IVF's    DVT prophylaxis: SCDs Code Status: DNR Family Communication: husband at bedside Disposition Plan: Likely home when clinically improved. Consults called: General surgery Admission status: inpatient / stepdown unit.   Kelly Splinter MD [facility] pgr [phone] 12/30/2017, 8:28 AM   Recent Labs  Lab 12/24/17 0919 12/29/17 1130 12/30/17 0610  NA 132* 126* 132*  K 3.8 3.6 3.4*  CL 100 93* 104  CO2 24 21* 20*  GLUCOSE 111 221* 158*  BUN 12 8 9   CREATININE 0.72 1.11* 0.51  CALCIUM 9.6 9.5 8.1*   Recent Labs  Lab 12/24/17 0919 12/29/17 1130 12/30/17 0610  AST 38* 40* 35  ALT 29 24 21   ALKPHOS 337* 441* 275*  BILITOT 0.9 1.9* 1.9*  PROT 7.2 6.7 5.6*  ALBUMIN 3.4* 3.3* 2.7*   Recent Labs  Lab 12/24/17 0919 12/29/17 1130 12/30/17 0610  WBC 5.1 11.2* 8.5  NEUTROABS 2.7 10.0*  --   HGB  --   --  8.9*  HCT 31.2* 31.0* 25.4*  MCV 84.8 84.7 86.4  PLT 235 234 165   Iron/TIBC/Ferritin/ %Sat No results found for: IRON, TIBC, FERRITIN, IRONPCTSAT

## 2017-12-30 NOTE — Procedures (Signed)
Interventional Radiology Procedure Note  Procedure: Revision of left biliary drainage catheter.  A new 41F int/ext biliary drain modified with additional sideholes was successfully placed across the jejunal stricture.  Complications: None  Estimated Blood Loss: None  Recommendations: - Drain to bag for at least 24 hrs then cap - Return to IR in 8 weeks for tube check and exchange.  - Call IR directly for any tube issues   Signed,  Criselda Peaches, MD

## 2017-12-31 DIAGNOSIS — F419 Anxiety disorder, unspecified: Secondary | ICD-10-CM

## 2017-12-31 DIAGNOSIS — K831 Obstruction of bile duct: Secondary | ICD-10-CM

## 2017-12-31 DIAGNOSIS — I482 Chronic atrial fibrillation: Secondary | ICD-10-CM

## 2017-12-31 DIAGNOSIS — C801 Malignant (primary) neoplasm, unspecified: Secondary | ICD-10-CM

## 2017-12-31 LAB — BASIC METABOLIC PANEL
Anion gap: 6 (ref 5–15)
BUN: 6 mg/dL (ref 6–20)
CHLORIDE: 103 mmol/L (ref 101–111)
CO2: 23 mmol/L (ref 22–32)
CREATININE: 0.49 mg/dL (ref 0.44–1.00)
Calcium: 7.8 mg/dL — ABNORMAL LOW (ref 8.9–10.3)
GFR calc non Af Amer: >60 mL/min (ref >60)
Glucose, Bld: 80 mg/dL (ref 65–99)
POTASSIUM: 3.7 mmol/L (ref 3.5–5.1)
Sodium: 132 mmol/L — ABNORMAL LOW (ref 135–145)

## 2017-12-31 LAB — CBC
HCT: 23.9 % — ABNORMAL LOW (ref 36.0–46.0)
HEMOGLOBIN: 8.3 g/dL — AB (ref 12.0–15.0)
MCH: 30 pg (ref 26.0–34.0)
MCHC: 34.7 g/dL (ref 30.0–36.0)
MCV: 86.3 fL (ref 78.0–100.0)
Platelets: 150 10*3/uL (ref 150–400)
RBC: 2.77 MIL/uL — ABNORMAL LOW (ref 3.87–5.11)
RDW: 14.8 % (ref 11.5–15.5)
WBC: 4.6 10*3/uL (ref 4.0–10.5)

## 2017-12-31 LAB — GLUCOSE, CAPILLARY: GLUCOSE-CAPILLARY: 87 mg/dL (ref 65–99)

## 2017-12-31 NOTE — Progress Notes (Signed)
OT Cancellation Note  Patient Details Name: [name] MRN: [id number] DOB: 23-Jan-1946   Cancelled Treatment:     checked with pt. No OT needs. She walked with PT and feels stronger today; able to do ADLs.  If drain is not removed prior to d/c, she will sponge bathe.    Nur Rabold 12/31/2017, 9:03 AM  Lesle Chris, OTR/L [phone] 12/31/2017

## 2017-12-31 NOTE — Progress Notes (Signed)
PROGRESS NOTE        PATIENT DETAILS Name: [name] Age: 72 y.o. Sex: female Date of Birth: 06-May-1946 Admit Date: 12/29/2017 Admitting Physician Eugenie Filler, MD GBT:[id number], Rebeca Alert, MD  Brief Narrative: Patient is a 72 y.o. female history of paroxysmal atrial fibrillation, pancreatic cancer is post Whipple's procedure-admitted with sepsis secondary to gram-negative bacteremia due to a biliary source.  See below for further details  Subjective: Ambulating in the room-no major complaints overnight.  Feels great.  Spouse at bedside  Assessment/Plan: Sepsis secondary to gram-negative bacteremia/cholangitis: Sepsis pathophysiology has resolved-blood culture positive for gram-negative rods-evaluated by general surgery/IR-she is currently status post revision of left biliary drainage.  Await further culture data-continue empiric Rocephin-await further recommendations from general surgery and interventional radiology.  Paroxysmal atrial fibrillation: Continue flecainide-should be okay to resume anticoagulation-we will discuss with general surgery  Pancreatic cancer: Follows with Dr.Sherill in the outpatient setting-is status post Whipple's procedure in 2015.  Recent biliary brushings have been negative for malignancy.  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  Severe protein calorie malnutrition: Continue supplements  Anemia: Secondary to critical illness-no indication for transfusion-follow for now.  No evidence of blood loss  Hyponatremia: Mild-stable for follow-up without any further workup  Hypertension: Blood pressure currently controlled without the use of any antihypertensives-follow for now.  Resume metoprolol when able  Anxiety/depression: Appears to be  stable-continue amitriptyline and as needed lorazepam  DVT Prophylaxis:  SCD's  Code Status: Full code   Family Communication: Spouse at bedside  Disposition Plan: Remain inpatient-transfer out of stepdown  Antimicrobial agents: Anti-infectives (From admission, onward)   Start     Dose/Rate Route Frequency Ordered Stop   12/30/17 2000  vancomycin (VANCOCIN) IVPB 1000 mg/200 mL premix  Status:  Discontinued     1,000 mg 200 mL/hr over 60 Minutes Intravenous Every 24 hours 12/30/17 0824 12/30/17 1104   12/30/17 1200  cefTRIAXone (ROCEPHIN) 2 g in sodium chloride 0.9 % 100 mL IVPB     2 g 200 mL/hr over 30 Minutes Intravenous Every 24 hours 12/30/17 1107     12/30/17 1000  meropenem (MERREM) 1 g in sodium chloride 0.9 % 100 mL IVPB  Status:  Discontinued     1 g 200 mL/hr over 30 Minutes Intravenous Every 8 hours 12/30/17 0824 12/30/17 1104   12/30/17 0200  meropenem (MERREM) 1 g in sodium chloride 0.9 % 100 mL IVPB  Status:  Discontinued     1 g 200 mL/hr over 30 Minutes Intravenous Every 12 hours 12/29/17 1833 12/30/17 0824   12/29/17 2000  vancomycin (VANCOCIN) IVPB 750 mg/150 ml premix  Status:  Discontinued     750 mg 150 mL/hr over 60 Minutes Intravenous Every 24 hours 12/29/17 1838 12/30/17 0824      Procedures: 4/9>> Revision of left biliary drainage catheter.  A new 79F int/ext biliary drain modified with additional sideholes was successfully placed across the jejunal stricture.  CONSULTS:  general surgery and IR  Time spent: 25 minutes-Greater than 50% of this time was spent in counseling, explanation of diagnosis, planning of further management, and coordination of care.  MEDICATIONS: Scheduled Meds: . amitriptyline  50 mg Oral QHS  .  Chlorhexidine Gluconate Cloth  6 each Topical Daily  . flecainide  100 mg Oral BID  . senna  1 tablet Oral BID  . sodium chloride flush  5 mL Intracatheter Q8H   Continuous Infusions: . sodium chloride 100 mL/hr at 12/31/17  0515  . cefTRIAXone (ROCEPHIN)  IV 2 g (12/31/17 1127)   PRN Meds:.acetaminophen, albuterol, diphenhydrAMINE, feeding supplement (ENSURE ENLIVE), fluticasone, HYDROcodone-acetaminophen, iopamidol, LORazepam, morphine, ondansetron **OR** ondansetron (ZOFRAN) IV, polyethylene glycol, sodium chloride flush, sodium phosphate, sorbitol, zolpidem   PHYSICAL EXAM: Vital signs: Vitals:   12/31/17 0343 12/31/17 0743 12/31/17 0800 12/31/17 1000  BP:   102/65 97/62  Pulse:      Resp:   16 (!) 22  Temp: 98.6 F (37 C) 98.4 F (36.9 C)    TempSrc: Oral Oral    SpO2:      Weight:      Height:       Filed Weights   12/30/17 0600  Weight: 64.1 kg (141 lb 5 oz)   Body mass index is 25.85 kg/m.   General appearance :Awake, alert, not in any distress. Speech Clear. Not toxic Looking Eyes:, pupils equally reactive to light and accomodation,no scleral icterus.Pink conjunctiva HEENT: Atraumatic and Normocephalic Neck: supple, no JVD. No cervical lymphadenopathy. No thyromegaly Resp:Good air entry bilaterally, no added sounds  CVS: S1 S2 regular, no murmurs.  GI: Bowel sounds present, Non tender and not distended with no gaurding, rigidity or rebound.No organomegaly Extremities: B/L Lower Ext shows no edema, both legs are warm to touch Neurology:  speech clear,Non focal, sensation is grossly intact. Psychiatric: Normal judgment and insight. Alert and oriented x 3. Normal mood. Musculoskeletal:No digital cyanosis Skin:No Rash, warm and dry Wounds:N/A  I have personally reviewed following labs and imaging studies  LABORATORY DATA: CBC: Recent Labs  Lab 12/29/17 1130 12/30/17 0610 12/31/17 0500  WBC 11.2* 8.5 4.6  NEUTROABS 10.0*  --   --   HGB  --  8.9* 8.3*  HCT 31.0* 25.4* 23.9*  MCV 84.7 86.4 86.3  PLT 234 165 683    Basic Metabolic Panel: Recent Labs  Lab 12/29/17 1130 12/29/17 2010 12/30/17 0610 12/31/17 0500  NA 126*  --  132* 132*  K 3.6  --  3.4* 3.7  CL 93*  --   104 103  CO2 21*  --  20* 23  GLUCOSE 221*  --  158* 80  BUN 8  --  9 6  CREATININE 1.11*  --  0.51 0.49  CALCIUM 9.5  --  8.1* 7.8*  MG  --  1.8  --   --     GFR: Estimated Creatinine Clearance: 57.5 mL/min (by C-G formula based on SCr of 0.49 mg/dL).  Liver Function Tests: Recent Labs  Lab 12/29/17 1130 12/30/17 0610  AST 40* 35  ALT 24 21  ALKPHOS 441* 275*  BILITOT 1.9* 1.9*  PROT 6.7 5.6*  ALBUMIN 3.3* 2.7*   No results for input(s): LIPASE, AMYLASE in the last 168 hours. No results for input(s): AMMONIA in the last 168 hours.  Coagulation Profile: Recent Labs  Lab 12/29/17 2010 12/30/17 0610  INR 2.21 2.40    Cardiac Enzymes: No results for input(s): CKTOTAL, CKMB, CKMBINDEX, TROPONINI in the last 168 hours.  BNP (last 3 results) Recent Labs    12/19/17 1257  PROBNP 103    HbA1C: No results for input(s): HGBA1C in the last 72 hours.  CBG: Recent Labs  Lab 12/30/17 [id number] 12/31/17 2229  GLUCAP 118* 87    Lipid Profile: No results for input(s): CHOL, HDL, LDLCALC, TRIG, CHOLHDL, LDLDIRECT in the last 72 hours.  Thyroid Function Tests: No results for input(s): TSH, T4TOTAL, FREET4, T3FREE, THYROIDAB in the last 72 hours.  Anemia Panel: Recent Labs    12/30/17 0610  VITAMINB12 412  FOLATE 11.3  FERRITIN 174  TIBC 193*  IRON 12*    Urine analysis:    Component Value Date/Time   COLORURINE AMBER (A) 12/29/2017 1121   APPEARANCEUR HAZY (A) 12/29/2017 1121   LABSPEC 1.019 12/29/2017 1121   LABSPEC 1.005 04/21/2014 1713   PHURINE 5.0 12/29/2017 1121   GLUCOSEU NEGATIVE 12/29/2017 1121   GLUCOSEU Negative 04/21/2014 1713   HGBUR NEGATIVE 12/29/2017 1121   BILIRUBINUR SMALL (A) 12/29/2017 1121   BILIRUBINUR Negative 04/21/2014 1713   KETONESUR 5 (A) 12/29/2017 1121   PROTEINUR 30 (A) 12/29/2017 1121   UROBILINOGEN 0.2 10/16/2014 1035   UROBILINOGEN 0.2 04/21/2014 1713   NITRITE NEGATIVE 12/29/2017 1121   LEUKOCYTESUR SMALL (A)  12/29/2017 1121   LEUKOCYTESUR Trace 04/21/2014 1713    Sepsis Labs: Lactic Acid, Venous    Component Value Date/Time   LATICACIDVEN 1.3 12/30/2017 0610    MICROBIOLOGY: Recent Results (from the past 240 hour(s))  Culture, Blood     Status: Abnormal (Preliminary result)   Collection Time: 12/29/17 11:30 AM  Result Value Ref Range Status   Specimen Description BLOOD PORTA CATH  Final   Special Requests   Final    BOTTLES DRAWN AEROBIC AND ANAEROBIC Blood Culture results may not be optimal due to an excessive volume of blood received in culture bottles   Culture  Setup Time   Final    GRAM NEGATIVE RODS IN BOTH AEROBIC AND ANAEROBIC BOTTLES CRITICAL RESULT CALLED TO, READ BACK BY AND VERIFIED WITH: T. GREEN, RPHARMD (WL) AT 0905 ON 12/30/17 BY C. JESSUP, MLT. Performed at [facility], [place] [address] [place], [place] [address]    Culture KLEBSIELLA PNEUMONIAE (A)  Final   Report Status PENDING  Incomplete  Blood Culture ID Panel (Reflexed)     Status: Abnormal   Collection Time: 12/29/17 11:30 AM  Result Value Ref Range Status   Enterococcus species NOT DETECTED NOT DETECTED Final   Listeria monocytogenes NOT DETECTED NOT DETECTED Final   Staphylococcus species NOT DETECTED NOT DETECTED Final   Staphylococcus aureus NOT DETECTED NOT DETECTED Final   Streptococcus species NOT DETECTED NOT DETECTED Final   Streptococcus agalactiae NOT DETECTED NOT DETECTED Final   Streptococcus pneumoniae NOT DETECTED NOT DETECTED Final   Streptococcus pyogenes NOT DETECTED NOT DETECTED Final   Acinetobacter baumannii NOT DETECTED NOT DETECTED Final   Enterobacteriaceae species DETECTED (A) NOT DETECTED Final    Comment: Enterobacteriaceae represent a large family of gram-negative bacteria, not a single organism. CRITICAL RESULT CALLED TO, READ BACK BY AND VERIFIED WITH: T. GREEN, RPHARMD (WL) AT 0539 ON 12/30/17 BY C. JESSUP, MLT.    Enterobacter cloacae complex NOT DETECTED NOT DETECTED  Final   Escherichia coli NOT DETECTED NOT DETECTED Final   Klebsiella oxytoca NOT DETECTED NOT DETECTED Final   Klebsiella pneumoniae DETECTED (A) NOT DETECTED Final    Comment: CRITICAL RESULT CALLED TO, READ BACK BY AND VERIFIED WITH: T. GREEN, RPHARMD (WL) AT 0905 ON 12/30/17 BY C. JESSUP, MLT.    Proteus species NOT DETECTED NOT DETECTED Final   Serratia marcescens NOT DETECTED NOT DETECTED Final   Carbapenem resistance NOT DETECTED NOT DETECTED  Final   Haemophilus influenzae NOT DETECTED NOT DETECTED Final   Neisseria meningitidis NOT DETECTED NOT DETECTED Final   Pseudomonas aeruginosa NOT DETECTED NOT DETECTED Final   Candida albicans NOT DETECTED NOT DETECTED Final   Candida glabrata NOT DETECTED NOT DETECTED Final   Candida krusei NOT DETECTED NOT DETECTED Final   Candida parapsilosis NOT DETECTED NOT DETECTED Final   Candida tropicalis NOT DETECTED NOT DETECTED Final    Comment: Performed at [facility], [place] [address], [place], [place] [address]  Culture, Blood     Status: None (Preliminary result)   Collection Time: 12/29/17 11:47 AM  Result Value Ref Range Status   Specimen Description BLOOD RIGHT ARM  Final   Special Requests   Final    BOTTLES DRAWN AEROBIC AND ANAEROBIC Blood Culture adequate volume   Culture  Setup Time   Final    GRAM NEGATIVE RODS IN BOTH AEROBIC AND ANAEROBIC BOTTLES CRITICAL RESULT CALLED TO, READ BACK BY AND VERIFIED WITH: T. GREEN, RPHARMD (WL) AT 2542 ON 12/30/17 BY C. JESSUP, MLT. Performed at [facility], [place] [address] [place], [place] [address]    Culture GRAM NEGATIVE RODS  Final   Report Status PENDING  Incomplete  Urine Culture     Status: None   Collection Time: 12/29/17  2:10 PM  Result Value Ref Range Status   Specimen Description   Final    URINE, RANDOM Performed at [facility], [place] [address], [place], [place] [address]    Special Requests   Final    NONE Performed at [facility], [place] [address], [place], [place] [address]    Culture   Final    NO GROWTH Performed at [facility], [place] [address], [place], [place] [address]    Report Status 12/30/2017 FINAL  Final  MRSA PCR Screening     Status: None   Collection Time: 12/29/17  6:50 PM  Result Value Ref Range Status   MRSA by PCR NEGATIVE NEGATIVE Final    Comment:        The GeneXpert MRSA Assay (FDA approved for NASAL specimens only), is one component of a comprehensive MRSA colonization surveillance program. It is not intended to diagnose MRSA infection nor to guide or monitor treatment for MRSA infections. Performed at [facility], [place] [address], [place],  [address]     RADIOLOGY STUDIES/RESULTS: Dg Chest 2 View  Result Date: 12/19/2017 CLINICAL DATA:  Chest pain.  Shortness of breath. EXAM: CHEST - 2 VIEW COMPARISON:  Chest CTA today.  Chest radiographs 12/02/2017. FINDINGS: A left subclavian Port-A-Cath terminates over the mid SVC, unchanged. The cardiomediastinal silhouette is within normal limits. No airspace consolidation, edema, pleural effusion, or pneumothorax is identified. There is slight chronic interstitial coarsening. A biliary drain is noted in the upper abdomen. There has been prior left shoulder arthroplasty. IMPRESSION: No active cardiopulmonary disease. Electronically Signed   By: Logan Bores M.D.   On: 12/19/2017 17:23   Dg Chest 2 View  Result Date: 12/02/2017 CLINICAL DATA:  Flu like symptoms EXAM: CHEST - 2 VIEW COMPARISON:  Chest CT 01/25/2017 and CXR 01/25/2017 FINDINGS: The heart size and mediastinal contours are within normal limits. Chronic mild interstitial prominence without alveolar consolidation, dominant mass, effusion or pneumothorax. Left-sided port catheter is noted with tip in the mid SVC. No acute osseous abnormality. Partially visualized left shoulder arthroplasty. IMPRESSION: No active cardiopulmonary  disease. Electronically Signed   By: Ashley Royalty M.D.   On: 12/02/2017 17:16   Ct Angio Chest Pe W And/or Wo Contrast  Result Date: 12/19/2017 CLINICAL DATA:  Chest pain. Dyspnea. Pancreatic cancer status post Whipple procedure, with recent percutaneous transhepatic biliary drainage catheter placement. EXAM: CT ANGIOGRAPHY CHEST WITH CONTRAST TECHNIQUE: Multidetector CT imaging of the chest was performed using the standard protocol during bolus administration of intravenous contrast. Multiplanar CT image reconstructions and MIPs were obtained to evaluate the vascular anatomy. CONTRAST:  162mL ISOVUE-370 IOPAMIDOL (ISOVUE-370) INJECTION 76% COMPARISON:  07/19/2017 PET-CT.  01/25/2017 chest CT angiogram. FINDINGS: Cardiovascular: The study is high quality for the evaluation of pulmonary embolism. There are no filling defects in the central, lobar, segmental or subsegmental pulmonary artery branches to suggest acute pulmonary embolism. Atherosclerotic thoracic aorta with stable ectatic 4.0 cm ascending thoracic aorta. Normal caliber pulmonary arteries. Normal heart size. No significant pericardial fluid/thickening. Left subclavian MediPort terminates in middle third of the superior vena cava. Mediastinum/Nodes: No discrete thyroid nodules. Unremarkable esophagus. No pathologically enlarged axillary, mediastinal or hilar lymph nodes. Lungs/Pleura: No pneumothorax. No pleural effusion. No acute consolidative airspace disease or lung masses. Bilateral upper lobe 4 mm pulmonary nodules (series 7/image 25 on the right and image 24 on the left) are both stable since 01/25/2017 chest CT and probably benign. No new significant pulmonary nodules. Upper abdomen: Partially visualized percutaneous left transhepatic internal external biliary drainage catheter is seen terminating within the afferent biliary drainage limb. Partially visualized postsurgical changes from Whipple surgery. Musculoskeletal: No aggressive appearing  focal osseous lesions. Partially visualized left shoulder arthroplasty. Chronic mild T3, T4 and T5 vertebral compression deformities are unchanged. Moderate thoracic spondylosis. Review of the MIP images confirms the above findings. IMPRESSION: 1. No pulmonary embolism. 2. No findings suspicious for metastatic disease in the chest. Tiny bilateral upper lobe pulmonary nodules are stable since 01/25/2017 chest CT and probably benign. 3. Stable ectatic 4.0 cm ascending thoracic aorta. Recommend annual imaging followup by CTA or MRA. This recommendation follows 2010 ACCF/AHA/AATS/ACR/ASA/SCA/SCAI/SIR/STS/SVM Guidelines for the Diagnosis and Management of Patients with Thoracic Aortic Disease. Circulation. 2010; 121: Q595-G387. 4. Partially visualized left percutaneous transhepatic internal external biliary drainage catheter in position. Partially visualized post Whipple change in the upper abdomen. Aortic Atherosclerosis (ICD10-I70.0). Electronically Signed   By: Ilona Sorrel M.D.   On: 12/19/2017 17:36   Ct Abdomen Pelvis W Contrast  Result Date: 12/02/2017 CLINICAL DATA:  Acute abdominal pain. Hypotension. Fever/chills. Pancreatic cancer diagnosed 2015. Endometrial cancer diagnosed 2012. EXAM: CT ABDOMEN AND PELVIS WITH CONTRAST TECHNIQUE: Multidetector CT imaging of the abdomen and pelvis was performed using the standard protocol following bolus administration of intravenous contrast. CONTRAST:  176mL ISOVUE-300 IOPAMIDOL (ISOVUE-300) INJECTION 61% COMPARISON:  11/18/2017. FINDINGS: Lower chest: Lung bases are clear. Hepatobiliary: No suspicious/enhancing hepatic lesions. Status post Whipple procedure with choledochojejunostomy. Mild intrahepatic and extrahepatic ductal dilatation. Dilated common duct, measuring 14 mm centrally (coronal image 44, previously 11 mm. Pancreas: Status post Whipple procedure with pancreaticojejunostomy. Atrophy of the residual pancreatic body/tail with mild pancreatic duct dilatation  (series 2/image 24). Spleen: Within normal limits. Adrenals/Urinary Tract: Adrenal glands within normal limits. 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 Left kidney is within normal limits. No hydronephrosis. Bladder is within normal limits. Stomach/Bowel: Status post partial gastrectomy with gastrojejunostomy. Mildly dilated afferent limb in the right upper quadrant (series 2/image 29), although with improved wall thickening when compared to the prior CT. No evidence of bowel obstruction. Appendix is not discretely visualized. Vascular/Lymphatic:  No evidence of abdominal aortic aneurysm. Atherosclerotic calcifications of the abdominal aorta and branch vessels. No suspicious abdominopelvic lymphadenopathy. Reproductive: Status post hysterectomy. No adnexal masses. Other: No abdominopelvic ascites. Musculoskeletal: Mild superior endplate Schmorl's node deformities at T10, T11, and L5. IMPRESSION: Mildly progressive intrahepatic and extrahepatic ductal dilatation, as described above. Associated dilatation of the afferent limb. 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 GI/surgical consultation is suggested. No findings specific for recurrent or metastatic disease in this patient with history of pancreatic cancer and endometrial cancer. Electronically Signed   By: Julian Hy M.D.   On: 12/02/2017 15:13   Mr 3d Recon At Scanner  Result Date: 12/03/2017 CLINICAL DATA:  Evaluate for pancreatitis. History of pancreas cancer. Now with ongoing abdominal pain. Progressive biliary dilatation. EXAM: MRI ABDOMEN WITHOUT AND WITH CONTRAST (INCLUDING MRCP) TECHNIQUE: Multiplanar multisequence MR imaging of the abdomen was performed both before and after the administration of intravenous contrast. Heavily T2-weighted images of the biliary and pancreatic ducts were obtained, and three-dimensional MRCP images were rendered by post processing. CONTRAST:  19mL MULTIHANCE  GADOBENATE DIMEGLUMINE 529 MG/ML IV SOLN COMPARISON:  12/02/2017 FINDINGS: Lower chest: No acute findings. Hepatobiliary: Diffusely heterogeneous enhancement pattern of the liver is identified. There is a focal area of increased T2 signal within the region of the caudate lobe of liver which measures 2.4 by 2.5 cm, image 23/3. This demonstrates relative increased enhancement compared with the remainder of the liver, image 48/1301. Within the remaining portions of the liver there is heterogeneous attenuation and enhancement. There is marked intrahepatic bile duct dilatation. This demonstrates significant progression when compared with 11/18/17. Pancreas: Residual pancreas is largely atrophic with dilatation of the main duct, image 59/1303. The main duct measures up to 7 mm. Spleen:  Within normal limits in size and appearance. Adrenals/Urinary Tract: The adrenal glands are normal. Unremarkable appearance of the kidneys. Stomach/Bowel: The stomach and the remaining small bowel loops are unremarkable. Dilatation of the hepaticojejunostomy loop is identified. There appears to be marked narrowing of the hepaticojejunostomy loop at the point at which the crosses the midline (from right to left) between the aorta and SMV, image 72/1303. Findings may reflect underlying postsurgical scarring. Recurrent tumor not excluded. Vascular/Lymphatic:  Normal appearance of the abdominal aorta. Other:  No ascites or focal fluid collections identified. Musculoskeletal: No suspicious bone lesions identified. IMPRESSION: 1. Postoperative changes from Whipple procedure again noted. On today's study there is marked and progressive dilatation of the hepaticojejunostomy loop, bile ducts and main pancreatic duct. Focal area of narrowing of the hepaticojejunostomy loop as it crosses the midline from right to left. Findings may represent underlying postsurgical scarring and stricture formation versus recurrent tumor. 2. There is a focal area of  abnormal signal in the region of the caudate lobe of liver with increased enhancement. It is unclear whether not this is abnormal signal and enhancement within the liver parenchyma or adenopathy. A PET-CT may be helpful to assess for recurrent metabolically active tumor. Electronically Signed   By: Kerby Moors M.D.   On: 12/03/2017 08:37   Dg Sinus/fist Tube Chk-non Gi  Result Date: 12/18/2017 INDICATION: 72 year old female with a history of pancreatic cancer, status post Whipple procedure 08/30/2014. The patient was treated for possible bile obstruction with hospitalization and internal/external biliary drainage performed 12/05/2017. The differential still includes afferent limb syndrome, as a cause for the bile obstruction. Internal/external drainage tube was exchanged 12/09/2017 with a bile duct cytology sample acquired, which has  formally resulted as a negative biopsy for tumor. EXAM: THROUGH THE TUBE CHOLANGIOGRAM VIA A LEFT SIDED PERCUTANEOUS TRANSHEPATIC INTERNAL/EXTERNAL DRAIN MEDICATIONS: None ANESTHESIA/SEDATION: None COMPLICATIONS: None PROCEDURE: Informed written consent was obtained from the patient after a thorough discussion of the procedural risks, benefits and alternatives. All questions were addressed. Maximal Sterile Barrier Technique was utilized including caps, mask, sterile gowns, sterile gloves, sterile drape, hand hygiene and skin antiseptic. A timeout was performed prior to the initiation of the procedure. Patient was positioned supine position on the fluoroscopy table. Scout images were acquired of the upper abdomen. Injection of contrast was performed under fluoroscopy. Multiple images were acquired of the intrahepatic and extrahepatic biliary system with obliquities achieved to confirm patency of the surgical anastomosis. 5-10 minute delayed imaging was performed of the abdomen to determine if there was downstream migration of contrast through the duodenum into the jejunum. The  catheter was then attached to gravity drainage. Patient tolerated the procedure well and remained hemodynamically stable throughout. No complications were encountered and no significant blood loss. FINDINGS: Sideholes of the catheter remain within the left-sided ducts, although there has been some withdrawal of the biliary drain. Contrast injected through the drain rapidly transits the surgical anastomosis which is demonstrated on oblique image to appear widely patent around the drain. Distal drain remains within the remnant afferent limb. Delayed imaging at 5-10 minutes demonstrates no evidence of contrast traversing the afferent limb into the jejunum, with mild dilation observed. IMPRESSION: Status post through the tube cholangiogram demonstrates adequate positioning of the internal/external drain. The injection confirms patency of the biliary-enteric surgical anastomosis. 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 Signed, Dulcy Fanny. Earleen Newport DO [facility] PLAN: The patient was left to gravity drainage. She has upcoming surgical appointment April 8th. Electronically Signed   By: Corrie Mckusick D.O.   On: 12/18/2017 13:01   Dg Chest Portable 1 View  Result Date: 12/29/2017 CLINICAL DATA:  States possible infection related to obstructed biliary duct-drain has only put out 200 cc in 2 days-fever. EXAM: PORTABLE CHEST 1 VIEW COMPARISON:  12/19/2017 FINDINGS: There is no focal parenchymal opacity. There is no pleural effusion or pneumothorax. The heart and mediastinal contours are unremarkable. There is a right-sided Port-A-Cath with the tip projecting over the SVC. There is generalized osteopenia. There is no aggressive osseous lesion. There is a left shoulder arthroplasty. IMPRESSION: No active disease. Electronically Signed   By: Kathreen Devoid   On: 12/29/2017 15:37    Dg Abd 2 Views  Result Date: 12/29/2017 CLINICAL DATA:  History of endometrial cancer. No bowel movement for 3 days. Pain. EXAM: ABDOMEN - 2 VIEW COMPARISON:  CT AP 12/19/2017. FINDINGS: Percutaneous biliary drain is again noted with pigtail overlying the right upper quadrant of the abdomen. There is no abnormal bowel dilatation. A moderate stool burden is identified within the colon and rectum. IMPRESSION: 1. Nonobstructive bowel gas pattern. Electronically Signed   By: Kerby Moors M.D.   On: 12/29/2017 19:32   Mr Abdomen Mrcp Moise Boring Contast  Result Date: 12/03/2017 CLINICAL DATA:  Evaluate for pancreatitis. History of pancreas cancer. Now with ongoing abdominal pain. Progressive biliary dilatation. EXAM: MRI ABDOMEN WITHOUT AND WITH CONTRAST (INCLUDING MRCP) TECHNIQUE: Multiplanar multisequence MR imaging of the abdomen was performed both before and after the administration of intravenous contrast. Heavily T2-weighted images of the biliary and pancreatic ducts were obtained, and three-dimensional MRCP  images were rendered by post processing. CONTRAST:  93mL MULTIHANCE GADOBENATE DIMEGLUMINE 529 MG/ML IV SOLN COMPARISON:  12/02/2017 FINDINGS: Lower chest: No acute findings. Hepatobiliary: Diffusely heterogeneous enhancement pattern of the liver is identified. There is a focal area of increased T2 signal within the region of the caudate lobe of liver which measures 2.4 by 2.5 cm, image 23/3. This demonstrates relative increased enhancement compared with the remainder of the liver, image 48/1301. Within the remaining portions of the liver there is heterogeneous attenuation and enhancement. There is marked intrahepatic bile duct dilatation. This demonstrates significant progression when compared with 11/18/17. Pancreas: Residual pancreas is largely atrophic with dilatation of the main duct, image 59/1303. The main duct measures up to 7 mm. Spleen:  Within normal limits in size and appearance. Adrenals/Urinary  Tract: The adrenal glands are normal. Unremarkable appearance of the kidneys. Stomach/Bowel: The stomach and the remaining small bowel loops are unremarkable. Dilatation of the hepaticojejunostomy loop is identified. There appears to be marked narrowing of the hepaticojejunostomy loop at the point at which the crosses the midline (from right to left) between the aorta and SMV, image 72/1303. Findings may reflect underlying postsurgical scarring. Recurrent tumor not excluded. Vascular/Lymphatic:  Normal appearance of the abdominal aorta. Other:  No ascites or focal fluid collections identified. Musculoskeletal: No suspicious bone lesions identified. IMPRESSION: 1. Postoperative changes from Whipple procedure again noted. On today's study there is marked and progressive dilatation of the hepaticojejunostomy loop, bile ducts and main pancreatic duct. Focal area of narrowing of the hepaticojejunostomy loop as it crosses the midline from right to left. Findings may represent underlying postsurgical scarring and stricture formation versus recurrent tumor. 2. There is a focal area of abnormal signal in the region of the caudate lobe of liver with increased enhancement. It is unclear whether not this is abnormal signal and enhancement within the liver parenchyma or adenopathy. A PET-CT may be helpful to assess for recurrent metabolically active tumor. Electronically Signed   By: Kerby Moors M.D.   On: 12/03/2017 08:37   Ir Int Lianne Cure Biliary Drain With Cholangiogram  Result Date: 12/05/2017 INDICATION: Pancreatic carcinoma status post Whipple, now with epigastric pain and dilatation of the hepaticojejunostomy loop and bile ducts. Elevated bilirubin. Decompression is requested. EXAM: PERCUTANEOUS TRANSHEPATIC CHOLANGIOGRAM PERCUTANEOUS INTERNAL/EXTERNAL BILIARY DRAIN MEDICATIONS: Patient was already receiving adequate prophylactic antibiotic coverage. ANESTHESIA/SEDATION: Intravenous Fentanyl and Versed were administered  as conscious sedation during continuous monitoring of the patient's level of consciousness and physiological / cardiorespiratory status by the radiology RN, with a total moderate sedation time of 23 minutes. PROCEDURE: Informed written consent was obtained from the patient after a thorough discussion of the procedural risks, benefits and alternatives. All questions were addressed. Maximal Sterile Barrier Technique was utilized including caps, mask, sterile gowns, sterile gloves, sterile drape, hand hygiene and skin antiseptic. A timeout was performed prior to the initiation of the procedure. Limited ultrasound confirmed intrahepatic biliary ductal dilatation in the left lobe. An appropriate skin entry site was determined. Under real-time ultrasound guidance, a 21 gauge needle advanced into a dilated peripheral left lateral segment duct. Bowel spontaneously returned through the needle hub. A 018 guidewire advanced centrally easily. A 6 French transitional dilator was advanced over the wire. Contrast injection for percutaneous transhepatic cholangiogram was performed. This confirmed placement within the central intrahepatic biliary tree. There is intra and extrahepatic biliary ductal dilatation. Contrast did flow across the anastomosis at the hepaticojejunostomy, with dilatation of the bowel. The dilator was exchanged over a Bentson  wire for a 5 [place] Kumpe catheter, advanced into the jejunal loop. Catheter exchanged over an Amplatz wire for vascular dilator which facilitated advancement of a 10 [place] internal external biliary drain catheter, placed with sideholes spanning the intrahepatic biliary tree, extending into the anastomosed jejunal loop. Contrast injection confirmed good position and patency. The catheter was secured externally with 0 Prolene suture and StatLock and placed to external drainage. The patient tolerated the procedure well. FLUOROSCOPY TIME:  2 minutes 54 seconds; 47 mGy COMPLICATIONS: None  immediate IMPRESSION: 1. Percutaneous transhepatic cholangiogram demonstrates intra and extrahepatic biliary ductal dilatation with patency of the hepaticojejunostomy anastomosis. 2. Technically successful internal/external biliary drain catheter placement as above. PLAN: Allow biliary decompression with internal /external drainage. Follow bilirubin. We can do a follow-up cholangiogram next week to assess the level of obstruction, possible brush biopsy. Electronically Signed   By: Lucrezia Europe M.D.   On: 12/05/2017 17:36   Ir Exchange Biliary Drain  Result Date: 12/30/2017 INDICATION: 72 year old female with a history of pancreatic cancer status post Whipple resection and choledocho jejunostomy. She has and existing 10 [place] percutaneous biliary drain which passes through the patent choledocho jejunostomy and into the jejunum. However, there is concern for obstruction of the draining jejunal loop. 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 EXAM: Biliary tube exchange and cholangiogram MEDICATIONS: None. Patient is currently an inpatient and receiving intravenous antibiotics ANESTHESIA/SEDATION: None FLUOROSCOPY TIME:  Fluoroscopy Time: 7 minutes 18 seconds (106 mGy). COMPLICATIONS: None immediate. PROCEDURE: Informed written consent was obtained from the patient after a thorough discussion of the procedural risks, benefits and alternatives. All questions were addressed. Maximal Sterile Barrier Technique was utilized including caps, mask, sterile gowns, sterile gloves, sterile drape, hand hygiene and skin antiseptic. A timeout was performed prior to the initiation of the procedure. An initial cholangiogram was attempted through the existing tube. However, the tube is pulled back and the injected contrast came out the sideholes just deep to the skin surface. Therefore, the retention suture was cut in the tube was transected. A Bentson wire was advanced through the tube  and into the jejunum. The tube was removed. A Kumpe the catheter was advanced over the wire and into the jejunum. Contrast injection was performed. The Kumpe the catheter was initially in the blind limb of the jejunal loop. The catheter was brought back and an angled roadrunner wire was used to navigate the catheter into the draining limb of the jejunal loop. Contrast injection demonstrates a dilated proximal draining limb of the jejunum. No contrast material passes into the more distal jejunum. There is a suspected high-grade stenosis/stricture. With significant manipulation, ultimately the catheter and wire were successfully navigated beyond the obstruction and into the transverse portion of the jejunal loop. Contrast injection through the catheter confirms that this represents the jejunum distal to the obstruction. There is good peristalsis and forward flow of contrast. The roadrunner wire was advanced into the jejunum. A new 12 French cook internal/external biliary drainage catheter was modified with several additional sideholes. The catheter was then advanced over the wire and formed with the locking loop in the jejunum distal to the jejunal obstruction. The most proximal side hole is located within the left hepatic ducts. The catheter was secured to the skin with an adhesive fixation device. IMPRESSION: 1. 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 2. Successful placement of a modified internal/external biliary drainage catheter with the distal loop  in the jejunum beyond the obstruction. The most proximal side holes are within the left intrahepatic biliary ducts. This tube should allow drainage of the biliary system and the obstructed segment of the jejunum. PLAN: 1. Maintain to gravity bag drainage for at least 24 hours before attempting capping. 2. Tentative plan to return Interventional Radiology in 8 weeks for biliary tube  check and exchange. If the patient develops problems with the tube prior to that, she should call to be seen sooner. Signed, Criselda Peaches, MD [facility] Electronically Signed   By: Jacqulynn Cadet M.D.   On: 12/30/2017 17:32   Ir Exchange Biliary Drain  Result Date: 12/09/2017 INDICATION: Status post Whipple procedure, biliary obstruction EXAM: CHOLANGIOGRAM THROUGH EXISTING CATHETER TRANSCATHETER BREAST BIOPSY OF THE BILE DUCT ANASTOMOSIS REPLACEMENT OF THE LEFT 10 FRENCH INTERNAL EXTERNAL BILIARY DRAIN MEDICATIONS: Patient is inpatient.  IV antibiotics are currently infusion. ANESTHESIA/SEDATION: Moderate (conscious) sedation was employed during this procedure. A total of Versed 2.0 mg and Fentanyl 100 mcg was administered intravenously. Moderate Sedation Time: 30 minutes. The patient's level of consciousness and vital signs were monitored continuously by radiology nursing throughout the procedure under my direct supervision. FLUOROSCOPY TIME:  Fluoroscopy Time: 10 minutes 54 seconds (564 mGy). COMPLICATIONS: None immediate. PROCEDURE: Informed written consent was obtained from the patient after a thorough discussion of the procedural risks, benefits and alternatives. All questions were addressed. Maximal Sterile Barrier Technique was utilized including caps, mask, sterile gowns, sterile gloves, sterile drape, hand hygiene and skin antiseptic. A timeout was performed prior to the initiation of the procedure. Under sterile conditions and local anesthesia, the existing left internal external biliary drain was injected with contrast for cholangiogram. Cholangiogram: This demonstrates improvement in the diffuse biliary dilatation extending to the bile duct anastomosis to the afferent loop. Catheter was cut and removed. 8 French sheath advanced over Amplatz guidewire. Bile duct anastomosis brushings: Alongside the Amplatz guidewire, bile duct  brush biopsy performed 3 times through the 8 French sheath. Images obtained for documentation. Sample sent for cytology. The Biliary drain replacement: Ten [place] internal external biliary drain replaced over the Amplatz guidewire. Retention loop formed in the afferent loop across the bile duct anastomosis. Ducts were decompressed by syringe aspiration. Images obtained for documentation. Catheter secured with a Prolene suture. Patient tolerated the procedure well. No immediate complication. IMPRESSION: Initial cholangiogram demonstrates improvement in biliary dilatation following drainage. 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 The afferent loop remains dilated and contrast does not drain down stream within the small bowel. 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 Successful bile duct anastomosis brush biopsy for cytology Replacement of the left internal external 10 [place] biliary drain. Electronically Signed   By: Jerilynn Mages.  Shick M.D.   On: 12/09/2017 13:49   Ir Endoluminal Bx Of Biliary Tree  Result Date: 12/09/2017 INDICATION: Status post Whipple procedure, biliary obstruction EXAM: CHOLANGIOGRAM THROUGH EXISTING CATHETER TRANSCATHETER BREAST BIOPSY OF THE BILE DUCT ANASTOMOSIS REPLACEMENT OF THE LEFT 10 FRENCH INTERNAL EXTERNAL BILIARY DRAIN MEDICATIONS: Patient is inpatient.  IV antibiotics are currently infusion. ANESTHESIA/SEDATION: Moderate (conscious) sedation was employed during this procedure. A total of Versed 2.0 mg and Fentanyl 100 mcg was administered intravenously. Moderate Sedation Time: 30 minutes. The patient's level of consciousness and vital signs were monitored continuously by radiology nursing throughout the procedure under my direct supervision.  FLUOROSCOPY TIME:  Fluoroscopy Time: 10 minutes 54 seconds (564 mGy). COMPLICATIONS: None  immediate. PROCEDURE: Informed written consent was obtained from the patient after a thorough discussion of the procedural risks, benefits and alternatives. All questions were addressed. Maximal Sterile Barrier Technique was utilized including caps, mask, sterile gowns, sterile gloves, sterile drape, hand hygiene and skin antiseptic. A timeout was performed prior to the initiation of the procedure. Under sterile conditions and local anesthesia, the existing left internal external biliary drain was injected with contrast for cholangiogram. Cholangiogram: This demonstrates improvement in the diffuse biliary dilatation extending to the bile duct anastomosis to the afferent loop. Catheter was cut and removed. 8 French sheath advanced over Amplatz guidewire. Bile duct anastomosis brushings: Alongside the Amplatz guidewire, bile duct brush biopsy performed 3 times through the 8 French sheath. Images obtained for documentation. Sample sent for cytology. The Biliary drain replacement: Ten [place] internal external biliary drain replaced over the Amplatz guidewire. Retention loop formed in the afferent loop across the bile duct anastomosis. Ducts were decompressed by syringe aspiration. Images obtained for documentation. Catheter secured with a Prolene suture. Patient tolerated the procedure well. No immediate complication. IMPRESSION: Initial cholangiogram demonstrates improvement in biliary dilatation following drainage. 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 The afferent loop remains dilated and contrast does not drain down stream within the small bowel. 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 Successful bile duct anastomosis brush biopsy for cytology Replacement of the left internal external 10 [place] biliary drain. Electronically Signed   By: Jerilynn Mages.  Shick M.D.   On:  12/09/2017 13:49   Ir Radiologist Eval & Mgmt  Result Date: 12/18/2017 Please refer to notes tab for details about interventional procedure. (Op Note)  US Abdomen Limited Ruq  Result Date: 12/02/2017 CLINICAL DATA:  Abdominal pain.  History of pancreatic carcinoma EXAM: ULTRASOUND ABDOMEN LIMITED RIGHT UPPER QUADRANT COMPARISON:  CT abdomen and pelvis November 18, 2017 FINDINGS: Gallbladder: Surgically absent. Common bile duct: Diameter: 11 mm, prominent. No biliary duct mass or calculus is appreciable by ultrasound. Liver: No focal lesion identified. Liver echogenicity overall is increased. There is intrahepatic biliary duct dilatation. Portal vein is patent on color Doppler imaging with normal direction of blood flow towards the liver. IMPRESSION: 1. Gallbladder absent. There is biliary duct dilatation, similar to recent CT. No biliary duct mass or calculus evident. 2. Diffuse increase in liver echogenicity, a finding felt to be indicative of hepatic steatosis. While no focal liver lesions are evident, it must be cautioned that the sensitivity of ultrasound for detection of focal liver lesions is diminished significantly in this circumstance. Electronically Signed   By: Lowella Grip III M.D.   On: 12/02/2017 14:19     LOS: 2 days   Oren Binet, MD  [facility] Pager:[phone]  If 7PM-7AM, please contact night-coverage [email or web address] Password [facility] 12/31/2017, 11:27 AM

## 2017-12-31 NOTE — Progress Notes (Signed)
Referring Physician(s): Byerly,F  Supervising Physician: Aletta Edouard  Patient Status:  [facility] - In-pt  Chief Complaint: recent fever, low blood pressure, chills     Subjective: Patient resting quietly in bed.  Denies worsening abdominal pain, nausea, vomiting.  Reports only minimal tenderness at abdominal drain insertion site.  Tolerating diet okay.   Allergies: Ace inhibitors; Scopolamine; and Sulfa antibiotics  Medications: Prior to Admission medications   Medication Sig Start Date End Date Taking? Authorizing Provider  acetaminophen (TYLENOL) 500 MG tablet Take 1,000 mg by mouth every 6 (six) hours as needed for pain.    Yes [provider]  albuterol (PROVENTIL HFA;VENTOLIN HFA) 108 (90 Base) MCG/ACT inhaler Inhale 2 puffs into the lungs every 6 (six) hours as needed for wheezing or shortness of breath. 01/27/17  Yes Mikhail, Velta Addison, DO  amitriptyline (ELAVIL) 25 MG tablet Take 50 mg by mouth at bedtime.   Yes [provider]  diphenhydrAMINE (BENADRYL) 25 MG tablet Take 25 mg by mouth at bedtime as needed for sleep.    Yes [provider]  ENSURE PLUS (ENSURE PLUS) LIQD Take 237 mLs by mouth 3 (three) times daily as needed (nutrition).   Yes [provider]  flecainide (TAMBOCOR) 100 MG tablet TAKE 1 TABLET BY MOUTH TWICE A DAY Patient taking differently: Take 100 mg by mouth every twelve hours 02/24/17  Yes Eileen Stanford, PA-C  fluticasone (FLONASE) 50 MCG/ACT nasal spray Place 1 spray into both nostrils 2 (two) times daily as needed for allergies.    Yes [provider]  guaiFENesin (MUCINEX) 600 MG 12 hr tablet Take 600 mg by mouth every morning.    Yes [provider]  HYDROcodone-acetaminophen (NORCO/VICODIN) 5-325 MG tablet Take 1-2 tablets by mouth every 4 (four) hours as needed for moderate pain. 11/19/17  Yes Ladell Pier, MD  lidocaine-prilocaine (EMLA) cream Apply small amount over port area 1-2 hours  prior to treatment and cover with plastic wrap.  DO NOT RUB IN. 07/09/16  Yes Ladell Pier, MD  LORazepam (ATIVAN) 1 MG tablet Take 0.5 tablets (0.5 mg total) by mouth every 8 (eight) hours as needed for anxiety. Patient taking differently: Take 1 mg by mouth every 8 (eight) hours as needed for anxiety.  05/10/15  Yes Owens Shark, NP  metoprolol succinate (TOPROL-XL) 25 MG 24 hr tablet Take 12.5 mg by mouth daily.   Yes [provider]  metroNIDAZOLE (FLAGYL) 500 MG tablet Take 500 mg by mouth 3 (three) times daily.   Yes [provider]  morphine (MSIR) 15 MG tablet Take 1 tablet (15 mg total) by mouth every 4 (four) hours as needed for severe pain. 12/24/17  Yes Ladell Pier, MD  promethazine (PHENERGAN) 12.5 MG tablet Take 1 tablet (12.5 mg total) by mouth every 6 (six) hours as needed for nausea or vomiting. 06/03/17  Yes Ladell Pier, MD  sodium chloride flush 0.9 % SOLN injection USE EVERY DAY 12/18/17  Yes [provider]  XARELTO 20 MG TABS tablet TAKE 1 TABLET BY MOUTH EVERY DAY Patient taking differently: Take 20 mg by mouth once a day 12/19/17  Yes Jerline Pain, MD  zolpidem (AMBIEN CR) 6.25 MG CR tablet Take 1 tablet (6.25 mg total) by mouth at bedtime. 02/26/17  Yes Ladell Pier, MD  amoxicillin-clavulanate (AUGMENTIN) 875-125 MG tablet TAKE 1 TABLET BY MOUTH TWICE A DAY FOR 3 WEEKS 12/24/17   [provider]  Vital Signs: BP (!) 110/93 (BP Location: Left Arm)   Pulse 74   Temp 97.8 F (36.6 C) (Oral)   Resp 16   Ht 5\' 2"  (1.575 m)   Wt 141 lb 5 oz (64.1 kg)   LMP  (LMP Unknown)   SpO2 100%   BMI 25.85 kg/m   Physical Exam awake, alert.  Mid abdominal bili drain intact, output 325 cc green bile.  Insertion site okay, mildly tender to palpation.  Abdomen soft  Imaging: Dg Chest Portable 1 View  Result Date: 12/29/2017 CLINICAL DATA:  States possible infection related to obstructed biliary duct-drain has only put out 200 cc  in 2 days-fever. EXAM: PORTABLE CHEST 1 VIEW COMPARISON:  12/19/2017 FINDINGS: There is no focal parenchymal opacity. There is no pleural effusion or pneumothorax. The heart and mediastinal contours are unremarkable. There is a right-sided Port-A-Cath with the tip projecting over the SVC. There is generalized osteopenia. There is no aggressive osseous lesion. There is a left shoulder arthroplasty. IMPRESSION: No active disease. Electronically Signed   By: Kathreen Devoid   On: 12/29/2017 15:37   Dg Abd 2 Views  Result Date: 12/29/2017 CLINICAL DATA:  History of endometrial cancer. No bowel movement for 3 days. Pain. EXAM: ABDOMEN - 2 VIEW COMPARISON:  CT AP 12/19/2017. FINDINGS: Percutaneous biliary drain is again noted with pigtail overlying the right upper quadrant of the abdomen. There is no abnormal bowel dilatation. A moderate stool burden is identified within the colon and rectum. IMPRESSION: 1. Nonobstructive bowel gas pattern. Electronically Signed   By: Kerby Moors M.D.   On: 12/29/2017 19:32   Ir Exchange Biliary Drain  Result Date: 12/30/2017 INDICATION: 72 year old female with a history of pancreatic cancer status post Whipple resection and choledocho jejunostomy. She has and existing 10 [place] percutaneous biliary drain which passes through the patent choledocho jejunostomy and into the jejunum. However, there is concern for obstruction of the draining jejunal loop. 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 EXAM: Biliary tube exchange and cholangiogram MEDICATIONS: None. Patient is currently an inpatient and receiving intravenous antibiotics ANESTHESIA/SEDATION: None FLUOROSCOPY TIME:  Fluoroscopy Time: 7 minutes 18 seconds (106 mGy). COMPLICATIONS: None immediate. PROCEDURE: Informed written consent was obtained from the patient after a thorough discussion of the procedural risks, benefits and alternatives. All questions were addressed. Maximal  Sterile Barrier Technique was utilized including caps, mask, sterile gowns, sterile gloves, sterile drape, hand hygiene and skin antiseptic. A timeout was performed prior to the initiation of the procedure. An initial cholangiogram was attempted through the existing tube. However, the tube is pulled back and the injected contrast came out the sideholes just deep to the skin surface. Therefore, the retention suture was cut in the tube was transected. A Bentson wire was advanced through the tube and into the jejunum. The tube was removed. A Kumpe the catheter was advanced over the wire and into the jejunum. Contrast injection was performed. The Kumpe the catheter was initially in the blind limb of the jejunal loop. The catheter was brought back and an angled roadrunner wire was used to navigate the catheter into the draining limb of the jejunal loop. Contrast injection demonstrates a dilated proximal draining limb of the jejunum. No contrast material passes into the more distal jejunum. There is a suspected high-grade stenosis/stricture. With significant manipulation, ultimately the catheter and wire were successfully navigated beyond the obstruction and into the transverse portion of the jejunal loop. Contrast injection through the  catheter confirms that this represents the jejunum distal to the obstruction. There is good peristalsis and forward flow of contrast. The roadrunner wire was advanced into the jejunum. A new 12 [place] internal/external biliary drainage catheter was modified with several additional sideholes. The catheter was then advanced over the wire and formed with the locking loop in the jejunum distal to the jejunal obstruction. The most proximal side hole is located within the left hepatic ducts. The catheter was secured to the skin with an adhesive fixation device. IMPRESSION: 1. 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 2. Successful placement of a modified internal/external biliary drainage catheter with the distal loop in the jejunum beyond the obstruction. The most proximal side holes are within the left intrahepatic biliary ducts. This tube should allow drainage of the biliary system and the obstructed segment of the jejunum. PLAN: 1. Maintain to gravity bag drainage for at least 24 hours before attempting capping. 2. Tentative plan to return Interventional Radiology in 8 weeks for biliary tube check and exchange. If the patient develops problems with the tube prior to that, she should call to be seen sooner. Signed, Criselda Peaches, MD [facility] Electronically Signed   By: Jacqulynn Cadet M.D.   On: 12/30/2017 17:32    Labs:  CBC: Recent Labs    12/19/17 1549 12/20/17 0412 12/24/17 0919 12/29/17 1130 12/30/17 0610 12/31/17 0500  WBC  --  6.3 5.1 11.2* 8.5 4.6  HGB 12.2 11.5*  --   --  8.9* 8.3*  HCT 36.0 33.8* 31.2* 31.0* 25.4* 23.9*  PLT  --  248 235 234 165 150    COAGS: Recent Labs    12/02/17 1928 12/04/17 0353 12/29/17 2010 12/30/17 0610  INR 1.70 1.47 2.21 2.40  APTT 41*  --  42*  --     BMP: Recent Labs    12/24/17 0919 12/29/17 1130 12/30/17 0610 12/31/17 0500  NA 132* 126* 132* 132*  K 3.8 3.6 3.4* 3.7  CL 100 93* 104 103  CO2 24 21* 20* 23  GLUCOSE 111 221* 158* 80  BUN 12 8 9 6   CALCIUM 9.6 9.5 8.1* 7.8*  CREATININE 0.72 1.11* 0.51 0.49  GFRNONAA >60 49* >60 >60  GFRAA >60 57* >60 >60    LIVER FUNCTION TESTS: Recent Labs    12/20/17 0412 12/24/17 0919 12/29/17 1130 12/30/17 0610  BILITOT 1.2 0.9 1.9* 1.9*  AST 57* 38* 40* 35  ALT 41 29 24 21   ALKPHOS 250* 337* 441* 275*  PROT 6.9 7.2 6.7 5.6*  ALBUMIN 3.4* 3.4* 3.3* 2.7*    Assessment and Plan: Patient with history of pancreatic cancer and prior Whipple resection and choledochojejunostomy; status post internal/external  biliary drain on 12/05/17.  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  Status post placement of a modified internal/external biliary drain with the distal loop of the jejunum beyond the obstruction.  Currently afebrile.  WBC normal, hemoglobin 8.3, creatinine normal, urine cultures negative, prior blood cultures growing Klebsiella and Enterobacter-on IV antibiotic therapy.  Plan today is to cap biliary drain and monitor progress for 24 hours.  If she does not tolerate capping well can be attached to gravity bag.  She will be scheduled for biliary tube check and exchange in 8 weeks.  Recommend once daily flushing (no aspirating) of biliary drain  as outpatient with 5-10 cc sterile normal saline, output recording and dressing changes every 1-2 days.  Patient should contact interventional radiology at [phone] and speak with interventional radiologist on call with any questions following discharge regarding drain management.   Electronically Signed: D. Rowe Robert, PA-C 12/31/2017, 3:14 PM   I spent a total of 20 minutes at the the patient's bedside AND on the patient's hospital floor or unit, greater than 50% of which was counseling/coordinating care for biliary drain    Patient ID: [name], female   DOB: August 14, 1946, 72 y.o.   MRN: [id number]

## 2017-12-31 NOTE — Progress Notes (Signed)
CC: Malaise/hypotension/fever/chills  Subjective: She is doing well this AM, ate a sub sandwich and did well with it.  She had a long discussion with Dr. Barry Dienes yesterday about options and understands those options.  She feels better and is going to the floor later today. Hoping to go home tomorrow.  Objective: Vital signs in last 24 hours: Temp:  [98.4 F (36.9 C)-99 F (37.2 C)] 98.6 F (37 C) (04/10 0343) Pulse Rate:  [65-79] 73 (04/10 0100) Resp:  [16-37] 20 (04/10 0100) BP: (80-119)/(24-69) 98/50 (04/10 0100) SpO2:  [93 %-100 %] 94 % (04/10 0100)  480 PO 2300 IV Voided x 2 recorded Drain 475 Afebrile, BP still low but better Na 132 WBC 4.6 H/H stable at 8.3/23.9   Intake/Output from previous day: 04/09 0701 - 04/10 0700 In: 2785 [P.O.:480; I.V.:2200; IV Piggyback:100] Out: 475 [Drains:475] Intake/Output this shift: No intake/output data recorded.  General appearance: alert, cooperative and no distress Resp: clear to auscultation bilaterally Cardio: regular rate and rhythm, S1, S2 normal, no murmur, click, rub or gallop GI: soft, non-tender; bowel sounds normal; no masses,  no organomegaly and drain working well, no pain or leakage from the drain site.  Lab Results:  Recent Labs    12/30/17 0610 12/31/17 0500  WBC 8.5 4.6  HGB 8.9* 8.3*  HCT 25.4* 23.9*  PLT 165 150    BMET Recent Labs    12/30/17 0610 12/31/17 0500  NA 132* 132*  K 3.4* 3.7  CL 104 103  CO2 20* 23  GLUCOSE 158* 80  BUN 9 6  CREATININE 0.51 0.49  CALCIUM 8.1* 7.8*   PT/INR Recent Labs    12/29/17 2010 12/30/17 0610  LABPROT 24.4* 26.0*  INR 2.21 2.40    Recent Labs  Lab 12/24/17 0919 12/29/17 1130 12/30/17 0610  AST 38* 40* 35  ALT 29 24 21   ALKPHOS 337* 441* 275*  BILITOT 0.9 1.9* 1.9*  PROT 7.2 6.7 5.6*  ALBUMIN 3.4* 3.3* 2.7*     Lipase     Component Value Date/Time   LIPASE 19 12/02/2017 1325   LIPASE 5 (L) 06/26/2017 1446     Medications: .  amitriptyline  50 mg Oral QHS  . Chlorhexidine Gluconate Cloth  6 each Topical Daily  . flecainide  100 mg Oral BID  . senna  1 tablet Oral BID  . sodium chloride flush  5 mL Intracatheter Q8H   . sodium chloride 100 mL/hr at 12/30/17 0214  . cefTRIAXone (ROCEPHIN)  IV Stopped (12/30/17 1309)   Anti-infectives (From admission, onward)   Start     Dose/Rate Route Frequency Ordered Stop   12/30/17 2000  vancomycin (VANCOCIN) IVPB 1000 mg/200 mL premix  Status:  Discontinued     1,000 mg 200 mL/hr over 60 Minutes Intravenous Every 24 hours 12/30/17 0824 12/30/17 1104   12/30/17 1200  cefTRIAXone (ROCEPHIN) 2 g in sodium chloride 0.9 % 100 mL IVPB     2 g 200 mL/hr over 30 Minutes Intravenous Every 24 hours 12/30/17 1107     12/30/17 1000  meropenem (MERREM) 1 g in sodium chloride 0.9 % 100 mL IVPB  Status:  Discontinued     1 g 200 mL/hr over 30 Minutes Intravenous Every 8 hours 12/30/17 0824 12/30/17 1104   12/30/17 0200  meropenem (MERREM) 1 g in sodium chloride 0.9 % 100 mL IVPB  Status:  Discontinued     1 g 200 mL/hr over 30 Minutes Intravenous Every 12  hours 12/29/17 1833 12/30/17 0824   12/29/17 2000  vancomycin (VANCOCIN) IVPB 750 mg/150 ml premix  Status:  Discontinued     750 mg 150 mL/hr over 60 Minutes Intravenous Every 24 hours 12/29/17 1838 12/30/17 0824      Assessment/Plan Hx of endometrial cancer Hx of chemotherapy/radiation therapy Hx of AF/Hx of PE- chronic anticoagulation Xarelto Anemia Hypotension/hypertension Hx GERD/Gastritis   Sepsis - hypotension 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 [name].  - elevated LFT/CA 19-9 =>> perc transhepatic cholangiogram, percutaneous biliary internal/external drain     12/05/17; bile duct anastomosis brush bx x 3;Replaced 68fr int ext biliary drain- gravity drainage -      negative  cytology -  Revision of left biliary drainage catheter.  A new 91F int/ext biliary drain modified with additional sideholes       was successfully placed across the jejunal stricture. 12/30/17   Hyponatremia/dehydration Severe protein calorie malnutrition   FEN:  IV fluids/Regular diet ID:  Meropenem/Vancomycin 4/8-4/9;  Rocephin 4/9 =>> day 2  DVT: Xarelto on hold Foley:  None Follow up:  Dr. Barry Dienes   Plan:  She has an appointment with Dr. Barry Dienes next week, and plans to keep it.         LOS: 2 days    Weldon Nouri 12/31/2017 [phone]

## 2018-01-01 ENCOUNTER — Ambulatory Visit (HOSPITAL_COMMUNITY)
Admission: RE | Admit: 2018-01-01 | Discharge: 2018-01-01 | Disposition: A | Discharge status: Home / Self Care | Payer: Medicare Other | Source: Ambulatory Visit | Attending: General Surgery | Admitting: General Surgery

## 2018-01-01 DIAGNOSIS — R7881 Bacteremia: Secondary | ICD-10-CM

## 2018-01-01 DIAGNOSIS — D638 Anemia in other chronic diseases classified elsewhere: Secondary | ICD-10-CM

## 2018-01-01 DIAGNOSIS — E871 Hypo-osmolality and hyponatremia: Secondary | ICD-10-CM

## 2018-01-01 LAB — CULTURE, BLOOD (SINGLE)

## 2018-01-01 LAB — BASIC METABOLIC PANEL
Anion gap: 8 (ref 5–15)
BUN: 5 mg/dL — ABNORMAL LOW (ref 6–20)
CO2: 25 mmol/L (ref 22–32)
Calcium: 8.1 mg/dL — ABNORMAL LOW (ref 8.9–10.3)
Chloride: 102 mmol/L (ref 101–111)
Creatinine, Ser: 0.41 mg/dL — ABNORMAL LOW (ref 0.44–1.00)
GFR calc Af Amer: >60 mL/min (ref >60)
GFR calc non Af Amer: >60 mL/min (ref >60)
Glucose, Bld: 83 mg/dL (ref 65–99)
Potassium: 3.8 mmol/L (ref 3.5–5.1)
Sodium: 135 mmol/L (ref 135–145)

## 2018-01-01 LAB — CBC
HCT: 23.5 % — ABNORMAL LOW (ref 36.0–46.0)
Hemoglobin: 8.3 g/dL — ABNORMAL LOW (ref 12.0–15.0)
MCH: 30.7 pg (ref 26.0–34.0)
MCHC: 35.3 g/dL (ref 30.0–36.0)
MCV: 87 fL (ref 78.0–100.0)
PLATELETS: 164 10*3/uL (ref 150–400)
RBC: 2.7 MIL/uL — AB (ref 3.87–5.11)
RDW: 14.8 % (ref 11.5–15.5)
WBC: 4 10*3/uL (ref 4.0–10.5)

## 2018-01-01 LAB — GLUCOSE, CAPILLARY: Glucose-Capillary: 86 mg/dL (ref 65–99)

## 2018-01-01 MED ORDER — RIVAROXABAN 20 MG PO TABS
20.0000 mg | ORAL_TABLET | Freq: Every day | ORAL | Status: DC
  Administered 2018-01-01: 20 mg via ORAL
  Filled 2018-01-01 (×2): qty 1

## 2018-01-01 NOTE — Progress Notes (Addendum)
Referring Physician(s): Byerly,F  Supervising Physician: Jacqulynn Cadet  Patient Status:  [facility] - In-pt  Chief Complaint: Recent fever, low blood pressure, chills   Subjective: Patient apparently failed biliary drain capping overnight with abdominal pain and leakage of fluid around bili drain insertion site; she was placed back to gravity bag drainage with resolution of symptoms and leakage.  No other complaints.   Allergies: Ace inhibitors; Scopolamine; and Sulfa antibiotics  Medications: Prior to Admission medications   Medication Sig Start Date End Date Taking? Authorizing Provider  acetaminophen (TYLENOL) 500 MG tablet Take 1,000 mg by mouth every 6 (six) hours as needed for pain.    Yes [provider]  albuterol (PROVENTIL HFA;VENTOLIN HFA) 108 (90 Base) MCG/ACT inhaler Inhale 2 puffs into the lungs every 6 (six) hours as needed for wheezing or shortness of breath. 01/27/17  Yes Mikhail, Velta Addison, DO  amitriptyline (ELAVIL) 25 MG tablet Take 50 mg by mouth at bedtime.   Yes [provider]  diphenhydrAMINE (BENADRYL) 25 MG tablet Take 25 mg by mouth at bedtime as needed for sleep.    Yes [provider]  ENSURE PLUS (ENSURE PLUS) LIQD Take 237 mLs by mouth 3 (three) times daily as needed (nutrition).   Yes [provider]  flecainide (TAMBOCOR) 100 MG tablet TAKE 1 TABLET BY MOUTH TWICE A DAY Patient taking differently: Take 100 mg by mouth every twelve hours 02/24/17  Yes Eileen Stanford, PA-C  fluticasone (FLONASE) 50 MCG/ACT nasal spray Place 1 spray into both nostrils 2 (two) times daily as needed for allergies.    Yes [provider]  guaiFENesin (MUCINEX) 600 MG 12 hr tablet Take 600 mg by mouth every morning.    Yes [provider]  HYDROcodone-acetaminophen (NORCO/VICODIN) 5-325 MG tablet Take 1-2 tablets by mouth every 4 (four) hours as needed for moderate pain. 11/19/17  Yes Ladell Pier, MD    lidocaine-prilocaine (EMLA) cream Apply small amount over port area 1-2 hours prior to treatment and cover with plastic wrap.  DO NOT RUB IN. 07/09/16  Yes Ladell Pier, MD  LORazepam (ATIVAN) 1 MG tablet Take 0.5 tablets (0.5 mg total) by mouth every 8 (eight) hours as needed for anxiety. Patient taking differently: Take 1 mg by mouth every 8 (eight) hours as needed for anxiety.  05/10/15  Yes Owens Shark, NP  metoprolol succinate (TOPROL-XL) 25 MG 24 hr tablet Take 12.5 mg by mouth daily.   Yes [provider]  metroNIDAZOLE (FLAGYL) 500 MG tablet Take 500 mg by mouth 3 (three) times daily.   Yes [provider]  morphine (MSIR) 15 MG tablet Take 1 tablet (15 mg total) by mouth every 4 (four) hours as needed for severe pain. 12/24/17  Yes Ladell Pier, MD  promethazine (PHENERGAN) 12.5 MG tablet Take 1 tablet (12.5 mg total) by mouth every 6 (six) hours as needed for nausea or vomiting. 06/03/17  Yes Ladell Pier, MD  sodium chloride flush 0.9 % SOLN injection USE EVERY DAY 12/18/17  Yes [provider]  XARELTO 20 MG TABS tablet TAKE 1 TABLET BY MOUTH EVERY DAY Patient taking differently: Take 20 mg by mouth once a day 12/19/17  Yes Jerline Pain, MD  zolpidem (AMBIEN CR) 6.25 MG CR tablet Take 1 tablet (6.25 mg total) by mouth at bedtime. 02/26/17  Yes Ladell Pier, MD  amoxicillin-clavulanate (AUGMENTIN) 875-125 MG tablet TAKE 1 TABLET BY MOUTH TWICE A DAY FOR 3  WEEKS 12/24/17   [provider]     Vital Signs: BP 119/74 (BP Location: Right Arm)   Pulse 74   Temp 97.6 F (36.4 C) (Oral)   Resp 20   Ht 5\' 2"  (1.575 m)   Wt 150 lb 4.8 oz (68.2 kg)   LMP  (LMP Unknown)   SpO2 100%   BMI 27.49 kg/m   Physical Exam biliary drain intact, insertion site okay, mildly tender.  Output 600 cc green/bilious fluid  Imaging: Dg Chest Portable 1 View  Result Date: 12/29/2017 CLINICAL DATA:  States possible infection related to obstructed biliary  duct-drain has only put out 200 cc in 2 days-fever. EXAM: PORTABLE CHEST 1 VIEW COMPARISON:  12/19/2017 FINDINGS: There is no focal parenchymal opacity. There is no pleural effusion or pneumothorax. The heart and mediastinal contours are unremarkable. There is a right-sided Port-A-Cath with the tip projecting over the SVC. There is generalized osteopenia. There is no aggressive osseous lesion. There is a left shoulder arthroplasty. IMPRESSION: No active disease. Electronically Signed   By: Kathreen Devoid   On: 12/29/2017 15:37   Dg Abd 2 Views  Result Date: 12/29/2017 CLINICAL DATA:  History of endometrial cancer. No bowel movement for 3 days. Pain. EXAM: ABDOMEN - 2 VIEW COMPARISON:  CT AP 12/19/2017. FINDINGS: Percutaneous biliary drain is again noted with pigtail overlying the right upper quadrant of the abdomen. There is no abnormal bowel dilatation. A moderate stool burden is identified within the colon and rectum. IMPRESSION: 1. Nonobstructive bowel gas pattern. Electronically Signed   By: Kerby Moors M.D.   On: 12/29/2017 19:32   Ir Exchange Biliary Drain  Result Date: 12/30/2017 INDICATION: 72 year old female with a history of pancreatic cancer status post Whipple resection and choledocho jejunostomy. She has and existing 10 [place] percutaneous biliary drain which passes through the patent choledocho jejunostomy and into the jejunum. However, there is concern for obstruction of the draining jejunal loop. 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 EXAM: Biliary tube exchange and cholangiogram MEDICATIONS: None. Patient is currently an inpatient and receiving intravenous antibiotics ANESTHESIA/SEDATION: None FLUOROSCOPY TIME:  Fluoroscopy Time: 7 minutes 18 seconds (106 mGy). COMPLICATIONS: None immediate. PROCEDURE: Informed written consent was obtained from the patient after a thorough discussion of the procedural risks, benefits and alternatives. All  questions were addressed. Maximal Sterile Barrier Technique was utilized including caps, mask, sterile gowns, sterile gloves, sterile drape, hand hygiene and skin antiseptic. A timeout was performed prior to the initiation of the procedure. An initial cholangiogram was attempted through the existing tube. However, the tube is pulled back and the injected contrast came out the sideholes just deep to the skin surface. Therefore, the retention suture was cut in the tube was transected. A Bentson wire was advanced through the tube and into the jejunum. The tube was removed. A Kumpe the catheter was advanced over the wire and into the jejunum. Contrast injection was performed. The Kumpe the catheter was initially in the blind limb of the jejunal loop. The catheter was brought back and an angled roadrunner wire was used to navigate the catheter into the draining limb of the jejunal loop. Contrast injection demonstrates a dilated proximal draining limb of the jejunum. No contrast material passes into the more distal jejunum. There is a suspected high-grade stenosis/stricture. With significant manipulation, ultimately the catheter and wire were successfully navigated beyond the obstruction and into the transverse portion of the jejunal loop. Contrast injection through the  catheter confirms that this represents the jejunum distal to the obstruction. There is good peristalsis and forward flow of contrast. The roadrunner wire was advanced into the jejunum. A new 12 [place] cook internal/external biliary drainage catheter was modified with several additional sideholes. The catheter was then advanced over the wire and formed with the locking loop in the jejunum distal to the jejunal obstruction. The most proximal side hole is located within the left hepatic ducts. The catheter was secured to the skin with an adhesive fixation device. IMPRESSION: 1. 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 2. Successful placement of a modified internal/external biliary drainage catheter with the distal loop in the jejunum beyond the obstruction. The most proximal side holes are within the left intrahepatic biliary ducts. This tube should allow drainage of the biliary system and the obstructed segment of the jejunum. PLAN: 1. Maintain to gravity bag drainage for at least 24 hours before attempting capping. 2. Tentative plan to return Interventional Radiology in 8 weeks for biliary tube check and exchange. If the patient develops problems with the tube prior to that, she should call to be seen sooner. Signed, Criselda Peaches, MD [facility] Radiology Electronically Signed   By: Jacqulynn Cadet M.D.   On: 12/30/2017 17:32    Labs:  CBC: Recent Labs    12/20/17 0412  12/29/17 1130 12/30/17 0610 12/31/17 0500 01/01/18 0345  WBC 6.3   < > 11.2* 8.5 4.6 4.0  HGB 11.5*  --   --  8.9* 8.3* 8.3*  HCT 33.8*   < > 31.0* 25.4* 23.9* 23.5*  PLT 248   < > 234 165 150 164   < > = values in this interval not displayed.    COAGS: Recent Labs    12/02/17 1928 12/04/17 0353 12/29/17 2010 12/30/17 0610  INR 1.70 1.47 2.21 2.40  APTT 41*  --  42*  --     BMP: Recent Labs    12/29/17 1130 12/30/17 0610 12/31/17 0500 01/01/18 0345  NA 126* 132* 132* 135  K 3.6 3.4* 3.7 3.8  CL 93* 104 103 102  CO2 21* 20* 23 25  GLUCOSE 221* 158* 80 83  BUN 8 9 6  5*  CALCIUM 9.5 8.1* 7.8* 8.1*  CREATININE 1.11* 0.51 0.49 0.41*  GFRNONAA 49* >60 >60 >60  GFRAA 57* >60 >60 >60    LIVER FUNCTION TESTS: Recent Labs    12/20/17 0412 12/24/17 0919 12/29/17 1130 12/30/17 0610  BILITOT 1.2 0.9 1.9* 1.9*  AST 57* 38* 40* 35  ALT 41 29 24 21   ALKPHOS 250* 337* 441* 275*  PROT 6.9 7.2 6.7 5.6*  ALBUMIN 3.4* 3.4* 3.3* 2.7*    Assessment and Plan:  Patient with history of pancreatic cancer and prior Whipple  resection and choledochojejunostomy; status post internal/external biliary drain on 12/05/17.  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  Status post placement of a modified internal/external biliary drain with the distal loop of the jejunum beyond the obstruction.  Currently afebrile.  WBC normal, hemoglobin 8.3, creatinine normal, K 3.8, urine cultures negative, prior blood cultures growing Klebsiella and Enterobacter-on IV antibiotic therapy.  Failed drain capping yesterday.  Continue drain to gravity bag and flush 1-2 times daily with 5-10 cc sterile normal saline.  As outpatient recommend flushing of drain 1-2 times per day with  5-10 cc sterile normal saline, output recording and dressing changes every 1-2 days.  Patient should call interventional radiology at [phone] and speak with interventional radiologist on call with any drain related questions.  Also with external biliary drain in place long-term patient needs to have electrolytes monitored regularly.  She will be scheduled for biliary tube check and exchange in 8 weeks.    Electronically Signed: D. Rowe Robert, PA-C 01/01/2018, 10:02 AM   I spent a total of 15 minutes at the the patient's bedside AND on the patient's hospital floor or unit, greater than 50% of which was counseling/coordinating care for biliary drain    Patient ID: [name], female   DOB: [birth year], 72 y.o.   MRN: [id number]

## 2018-01-01 NOTE — Progress Notes (Signed)
KZ:SWFUXNA/TFTDDUKGURK/YHCWC/BJSEGB  Subjective: She had a bad night.  They capped the drain and she developed abdominal pain, and then the fluid started leaking around the drain insertion site.  They finally opened it and put a new bag on the drain and she feels better leakage around the insertion site resolved, and pain resolved.  She was found to have a positive blood culture today also.     Objective: Vital signs in last 24 hours: Temp:  [97.6 F (36.4 C)-98.4 F (36.9 C)] 97.6 F (36.4 C) (04/11 0540) Pulse Rate:  [63-74] 74 (04/11 0540) Resp:  [16-22] 20 (04/11 0540) BP: (92-119)/(54-93) 119/74 (04/11 0540) SpO2:  [98 %-100 %] 100 % (04/11 0540) Weight:  [68.2 kg (150 lb 4.8 oz)] (P) 68.2 kg (150 lb 4.8 oz) (04/11 0543) Last BM Date: 12/26/17 480 Po recorded 1100 IV Urine x 3 Drain 600 Afebrile, VSS Labs OK Enterobacteriaceae species on blood culture today  Intake/Output from previous day: 04/10 0701 - 04/11 0700 In: 1580 [P.O.:480; I.V.:1000; IV Piggyback:100] Out: 600 [Drains:600] Intake/Output this shift: No intake/output data recorded.  General appearance: alert, cooperative and no distress GI: soft, drain bag reattached to drain, it was just emptied.  No distension or pain.    Lab Results:  Recent Labs    12/31/17 0500 01/01/18 0345  WBC 4.6 4.0  HGB 8.3* 8.3*  HCT 23.9* 23.5*  PLT 150 164    BMET Recent Labs    12/31/17 0500 01/01/18 0345  NA 132* 135  K 3.7 3.8  CL 103 102  CO2 23 25  GLUCOSE 80 83  BUN 6 5*  CREATININE 0.49 0.41*  CALCIUM 7.8* 8.1*   PT/INR Recent Labs    12/29/17 2010 12/30/17 0610  LABPROT 24.4* 26.0*  INR 2.21 2.40    Recent Labs  Lab 12/29/17 1130 12/30/17 0610  AST 40* 35  ALT 24 21  ALKPHOS 441* 275*  BILITOT 1.9* 1.9*  PROT 6.7 5.6*  ALBUMIN 3.3* 2.7*     Lipase     Component Value Date/Time   LIPASE 19 12/02/2017 1325   LIPASE 5 (L) 06/26/2017 1446     Medications: . amitriptyline   50 mg Oral QHS  . Chlorhexidine Gluconate Cloth  6 each Topical Daily  . flecainide  100 mg Oral BID  . senna  1 tablet Oral BID  . 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 [name]. - elevated LFT/CA 19-9 =>>perc transhepatic cholangiogram, percutaneous biliary internal/external drain 12/05/17; bile duct anastomosis brush bx x 3;Replaced 22fr int ext biliary drain- gravity drainage -      negative cytology -  Revision of left biliary drainage catheter. A new 59F int/ext biliary drain modified with additional sideholes       was successfully placed across the jejunal stricture. 12/30/17  - attempted capping of drain 4/10 =>> abdominal pain and leakage of fluid around drain insertion site - bag     reapplied to the drain and pain/leakage resolved   Hyponatremia/dehydration Severe protein calorie malnutrition Bacteremia - Enterobacteriaceae species  FEN: IV fluids/Regular diet ID: Meropenem/Vancomycin 4/8-4/9;  Rocephin 4/9 =>> day 2  DVT: Xarelto on hold - OK to restart per Dr. Barry Dienes Foley:  None Follow up: Dr. Barry Dienes  Plan:  Dr. Sloan Leiter is working on home PO antibiotics for bacteremia.  Home when safe from a medical standpoint.         LOS: 3 days    [name] 01/01/2018 [phone]

## 2018-01-01 NOTE — Care Management Important Message (Signed)
Important Message  Patient Details  Name: [name] MRN: [id number] Date of Birth: 22-Jul-1946   Medicare Important Message Given:  Yes    Kerin Salen 01/01/2018, 10:35 AMImportant Message  Patient Details  Name: [name] MRN: [id number] Date of Birth: 19-Nov-1945   Medicare Important Message Given:  Yes    Kerin Salen 01/01/2018, 10:34 AM

## 2018-01-01 NOTE — Progress Notes (Signed)
Physical Therapy Treatment Patient Details Name: [name] MRN: [id number] DOB: Dec 28, 1945 Today's Date: 01/01/2018    History of Present Illness 72 y.o. 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 Admitted 12/29/17  with Hypotension fevers at home  . Plan  is for follow-up cholangiogram with biliary drain exchange.      PT Comments    Pt ambulated 380' without an assistive device, no loss of balance, HR 80s. Pt has met PT goals, will sign off. I encouraged pt to ambulate at least TID in hall independently to minimize deconditioning.   Follow Up Recommendations  No PT follow up     Equipment Recommendations  None recommended by PT    Recommendations for Other Services       Precautions / Restrictions Precautions Precautions: Other (comment) Precaution Comments: biliary drain on left Restrictions Weight Bearing Restrictions: No    Mobility  Bed Mobility Overal bed mobility: Independent                Transfers Overall transfer level: Independent Equipment used: None Transfers: Sit to/from Stand Sit to Stand: Independent            Ambulation/Gait Ambulation/Gait assistance: Independent Ambulation Distance (Feet): 380 Feet Assistive device: None Gait Pattern/deviations: WFL(Within Functional Limits)   Gait velocity interpretation: at or above normal speed for age/gender General Gait Details: pt ambulated without assistive device, HR 80s, no loss of balance   Stairs            Wheelchair Mobility    Modified Rankin (Stroke Patients Only)       Balance Overall balance assessment: No apparent balance deficits (not formally assessed)                                          Cognition Arousal/Alertness: Awake/alert Behavior During Therapy: WFL for tasks assessed/performed Overall Cognitive Status: Within Functional Limits for tasks assessed                                        Exercises      General Comments        Pertinent Vitals/Pain Pain Assessment: No/denies pain    Home Living                      Prior Function            PT Goals (current goals can now be found in the care plan section) Acute Rehab PT Goals Patient Stated Goal: to work in her yard PT Goal Formulation: All assessment and education complete, DC therapy Progress towards PT goals: Goals met/education completed, patient discharged from PT    Frequency    Min 3X/week      PT Plan Current plan remains appropriate    Co-evaluation              AM-PAC PT "6 Clicks" Daily Activity  Outcome Measure  Difficulty turning over in bed (including adjusting bedclothes, sheets and blankets)?: None Difficulty moving from lying on back to sitting  on the side of the bed? : None Difficulty sitting down on and standing up from a chair with arms (e.g., wheelchair, bedside commode, etc,.)?: None Help needed moving to and from a bed to chair (including a wheelchair)?: None Help needed walking in hospital room?: None Help needed climbing 3-5 steps with a railing? : A Little 6 Click Score: 23    End of Session   Activity Tolerance: Patient tolerated treatment well Patient left: in bed;with call bell/phone within reach;with family/visitor present Nurse Communication: Mobility status PT Visit Diagnosis: Other abnormalities of gait and mobility (R26.89)     Time: 0379-4446 PT Time Calculation (min) (ACUTE ONLY): 11 min  Charges:  $Gait Training: 8-22 mins                    G Codes:          Philomena Doheny 01/01/2018, 11:39 AM [phone]

## 2018-01-01 NOTE — Progress Notes (Signed)
PROGRESS NOTE        PATIENT DETAILS Name: [name] Age: 72 y.o. Sex: female Date of Birth: November 21, 1945 Admit Date: 12/29/2017 Admitting Physician Eugenie Filler, MD [id number], Rebeca Alert, MD  Brief Narrative: Patient is a 72 y.o. female history of paroxysmal atrial fibrillation, pancreatic cancer is post Whipple's procedure-admitted with sepsis secondary to gram-negative bacteremia due to a biliary source.  See below for further details  Subjective: Ambulating in the room-no major complaints overnight.  Feels great.  Spouse at bedside  Assessment/Plan: Sepsis secondary to Klebsiella bacteremia/cholangitis: Sepsis pathophysiology has resolved-blood culture positive for Klebsiella-awaiting sensitivities.  Continue Rocephin-appreciate IR and general surgery follow-up.  Hopefully we can transition her to oral antimicrobial agent tomorrow and discharge her home with close outpatient follow-up with IR and [facility].   Paroxysmal atrial fibrillation: Continue flecainide-Xarelto resumed on 4/11 after discussion with general surgery.    Pancreatic cancer: Follows with Dr.Sherill in the outpatient setting-is status post Whipple's procedure in 2015.  Recent biliary brushings have been negative for malignancy.  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  Severe protein calorie malnutrition: Continue supplements  Anemia: Secondary to critical illness-no indication for transfusion-follow for now.  No evidence of blood loss  Hyponatremia: Mild-stable for follow-up without any further workup  Hypertension: Blood pressure currently controlled without the use of any antihypertensives-follow for now.  Resume metoprolol when able  Anxiety/depression: Appears to be stable-continue amitriptyline and as  needed lorazepam  DVT Prophylaxis:  SCD's  Code Status: Full code   Family Communication: Spouse at bedside  Disposition Plan: Remain inpatient-suspect home with home health services tomorrow  Antimicrobial agents: Anti-infectives (From admission, onward)   Start     Dose/Rate Route Frequency Ordered Stop   12/30/17 2000  vancomycin (VANCOCIN) IVPB 1000 mg/200 mL premix  Status:  Discontinued     1,000 mg 200 mL/hr over 60 Minutes Intravenous Every 24 hours 12/30/17 0824 12/30/17 1104   12/30/17 1200  cefTRIAXone (ROCEPHIN) 2 g in sodium chloride 0.9 % 100 mL IVPB     2 g 200 mL/hr over 30 Minutes Intravenous Every 24 hours 12/30/17 1107     12/30/17 1000  meropenem (MERREM) 1 g in sodium chloride 0.9 % 100 mL IVPB  Status:  Discontinued     1 g 200 mL/hr over 30 Minutes Intravenous Every 8 hours 12/30/17 0824 12/30/17 1104   12/30/17 0200  meropenem (MERREM) 1 g in sodium chloride 0.9 % 100 mL IVPB  Status:  Discontinued     1 g 200 mL/hr over 30 Minutes Intravenous Every 12 hours 12/29/17 1833 12/30/17 0824   12/29/17 2000  vancomycin (VANCOCIN) IVPB 750 mg/150 ml premix  Status:  Discontinued     750 mg 150 mL/hr over 60 Minutes Intravenous Every 24 hours 12/29/17 1838 12/30/17 0824      Procedures: 4/9>> Revision of left biliary drainage catheter.  A new 24F int/ext biliary drain modified with additional sideholes was successfully placed across the jejunal stricture.  CONSULTS:  general surgery and IR  Time spent: 25 minutes-Greater than 50% of this time was spent in counseling, explanation of diagnosis, planning of further management, and coordination of care.  MEDICATIONS: Scheduled Meds: .  amitriptyline  50 mg Oral QHS  . Chlorhexidine Gluconate Cloth  6 each Topical Daily  . flecainide  100 mg Oral BID  . rivaroxaban  20 mg Oral Q supper  . senna  1 tablet Oral BID  . sodium chloride flush  5 mL Intracatheter Q8H   Continuous Infusions: . cefTRIAXone  (ROCEPHIN)  IV 2 g (01/01/18 1147)   PRN Meds:.acetaminophen, albuterol, diphenhydrAMINE, feeding supplement (ENSURE ENLIVE), fluticasone, HYDROcodone-acetaminophen, iopamidol, LORazepam, morphine, ondansetron **OR** ondansetron (ZOFRAN) IV, polyethylene glycol, sodium chloride flush, sodium phosphate, sorbitol, zolpidem   PHYSICAL EXAM: Vital signs: Vitals:   12/31/17 2253 01/01/18 0540 01/01/18 0543 01/01/18 1310  BP: 110/61 119/74  95/71  Pulse: 71 74  64  Resp: 20 20    Temp: 98.4 F (36.9 C) 97.6 F (36.4 C)  (!) 97.4 F (36.3 C)  TempSrc: Oral Oral  Oral  SpO2: 98% 100%  99%  Weight:   68.2 kg (150 lb 4.8 oz)   Height:       Filed Weights   12/30/17 0600 01/01/18 0543  Weight: 64.1 kg (141 lb 5 oz) 68.2 kg (150 lb 4.8 oz)   Body mass index is 27.49 kg/m.   General appearance :Awake, alert, not in any distress.  Eyes:, pupils equally reactive to light and accomodation,no scleral icterus. HEENT: Atraumatic and Normocephalic Neck: supple, no JVD. Resp:Good air entry bilaterally, no rales or rhonchi CVS: S1 S2 regular, no murmurs.  GI: Bowel sounds present, Non tender and not distended with no gaurding, rigidity or rebound. Extremities: B/L Lower Ext shows no edema, both legs are warm to touch Neurology:  speech clear,Non focal, sensation is grossly intact. Psychiatric: Normal judgment and insight. Normal mood. Musculoskeletal:No digital cyanosis Skin:No Rash, warm and dry Wounds:N/A  I have personally reviewed following labs and imaging studies  LABORATORY DATA: CBC: Recent Labs  Lab 12/29/17 1130 12/30/17 0610 12/31/17 0500 01/01/18 0345  WBC 11.2* 8.5 4.6 4.0  NEUTROABS 10.0*  --   --   --   HGB  --  8.9* 8.3* 8.3*  HCT 31.0* 25.4* 23.9* 23.5*  MCV 84.7 86.4 86.3 87.0  PLT 234 165 150 562    Basic Metabolic Panel: Recent Labs  Lab 12/29/17 1130 12/29/17 2010 12/30/17 0610 12/31/17 0500 01/01/18 0345  NA 126*  --  132* 132* 135  K 3.6  --  3.4*  3.7 3.8  CL 93*  --  104 103 102  CO2 21*  --  20* 23 25  GLUCOSE 221*  --  158* 80 83  BUN 8  --  9 6 5*  CREATININE 1.11*  --  0.51 0.49 0.41*  CALCIUM 9.5  --  8.1* 7.8* 8.1*  MG  --  1.8  --   --   --     GFR: Estimated Creatinine Clearance: 59.2 mL/min (A) (by C-G formula based on SCr of 0.41 mg/dL (L)).  Liver Function Tests: Recent Labs  Lab 12/29/17 1130 12/30/17 0610  AST 40* 35  ALT 24 21  ALKPHOS 441* 275*  BILITOT 1.9* 1.9*  PROT 6.7 5.6*  ALBUMIN 3.3* 2.7*   No results for input(s): LIPASE, AMYLASE in the last 168 hours. No results for input(s): AMMONIA in the last 168 hours.  Coagulation Profile: Recent Labs  Lab 12/29/17 2010 12/30/17 0610  INR 2.21 2.40    Cardiac Enzymes: No results for input(s): CKTOTAL, CKMB, CKMBINDEX, TROPONINI in the last 168 hours.  BNP (last 3 results) Recent Labs  12/19/17 1257  PROBNP 103    HbA1C: No results for input(s): HGBA1C in the last 72 hours.  CBG: Recent Labs  Lab 12/30/17 0811 12/31/17 0742 01/01/18 0836  GLUCAP 118* 87 86    Lipid Profile: No results for input(s): CHOL, HDL, LDLCALC, TRIG, CHOLHDL, LDLDIRECT in the last 72 hours.  Thyroid Function Tests: No results for input(s): TSH, T4TOTAL, FREET4, T3FREE, THYROIDAB in the last 72 hours.  Anemia Panel: Recent Labs    12/30/17 0610  VITAMINB12 412  FOLATE 11.3  FERRITIN 174  TIBC 193*  IRON 12*    Urine analysis:    Component Value Date/Time   COLORURINE AMBER (A) 12/29/2017 1121   APPEARANCEUR HAZY (A) 12/29/2017 1121   LABSPEC 1.019 12/29/2017 1121   LABSPEC 1.005 04/21/2014 1713   PHURINE 5.0 12/29/2017 1121   GLUCOSEU NEGATIVE 12/29/2017 1121   GLUCOSEU Negative 04/21/2014 1713   HGBUR NEGATIVE 12/29/2017 1121   BILIRUBINUR SMALL (A) 12/29/2017 1121   BILIRUBINUR Negative 04/21/2014 1713   KETONESUR 5 (A) 12/29/2017 1121   PROTEINUR 30 (A) 12/29/2017 1121   UROBILINOGEN 0.2 10/16/2014 1035   UROBILINOGEN 0.2  04/21/2014 1713   NITRITE NEGATIVE 12/29/2017 1121   LEUKOCYTESUR SMALL (A) 12/29/2017 1121   LEUKOCYTESUR Trace 04/21/2014 1713    Sepsis Labs: Lactic Acid, Venous    Component Value Date/Time   LATICACIDVEN 1.3 12/30/2017 0610    MICROBIOLOGY: Recent Results (from the past 240 hour(s))  Culture, Blood     Status: Abnormal   Collection Time: 12/29/17 11:30 AM  Result Value Ref Range Status   Specimen Description BLOOD PORTA CATH  Final   Special Requests   Final    BOTTLES DRAWN AEROBIC AND ANAEROBIC Blood Culture results may not be optimal due to an excessive volume of blood received in culture bottles   Culture  Setup Time   Final    GRAM NEGATIVE RODS IN BOTH AEROBIC AND ANAEROBIC BOTTLES CRITICAL RESULT CALLED TO, READ BACK BY AND VERIFIED WITH: T. GREEN, RPHARMD ([place]) AT 0905 ON 12/30/17 BY C. JESSUP, MLT. Performed at [facility], [place] [address], [place], [place] [address]    Culture KLEBSIELLA PNEUMONIAE (A)  Final   Report Status 01/01/2018 FINAL  Final   Organism ID, Bacteria KLEBSIELLA PNEUMONIAE  Final      Susceptibility   Klebsiella pneumoniae - MIC*    AMPICILLIN >=32 RESISTANT Resistant     CEFAZOLIN <=4 SENSITIVE Sensitive     CEFEPIME <=1 SENSITIVE Sensitive     CEFTAZIDIME <=1 SENSITIVE Sensitive     CEFTRIAXONE <=1 SENSITIVE Sensitive     CIPROFLOXACIN <=0.25 SENSITIVE Sensitive     GENTAMICIN <=1 SENSITIVE Sensitive     IMIPENEM <=0.25 SENSITIVE Sensitive     TRIMETH/SULFA <=20 SENSITIVE Sensitive     AMPICILLIN/SULBACTAM 8 SENSITIVE Sensitive     PIP/TAZO <=4 SENSITIVE Sensitive     Extended ESBL NEGATIVE Sensitive     * KLEBSIELLA PNEUMONIAE  Blood Culture ID Panel (Reflexed)     Status: Abnormal   Collection Time: 12/29/17 11:30 AM  Result Value Ref Range Status   Enterococcus species NOT DETECTED NOT DETECTED Final   Listeria monocytogenes NOT DETECTED NOT DETECTED Final   Staphylococcus species NOT DETECTED NOT DETECTED Final    Staphylococcus aureus NOT DETECTED NOT DETECTED Final   Streptococcus species NOT DETECTED NOT DETECTED Final   Streptococcus agalactiae NOT DETECTED NOT DETECTED Final   Streptococcus pneumoniae NOT DETECTED NOT DETECTED Final  Streptococcus pyogenes NOT DETECTED NOT DETECTED Final   Acinetobacter baumannii NOT DETECTED NOT DETECTED Final   Enterobacteriaceae species DETECTED (A) NOT DETECTED Final    Comment: Enterobacteriaceae represent a large family of gram-negative bacteria, not a single organism. CRITICAL RESULT CALLED TO, READ BACK BY AND VERIFIED WITH: T. GREEN, RPHARMD (WL) AT 1062 ON 12/30/17 BY C. JESSUP, MLT.    Enterobacter cloacae complex NOT DETECTED NOT DETECTED Final   Escherichia coli NOT DETECTED NOT DETECTED Final   Klebsiella oxytoca NOT DETECTED NOT DETECTED Final   Klebsiella pneumoniae DETECTED (A) NOT DETECTED Final    Comment: CRITICAL RESULT CALLED TO, READ BACK BY AND VERIFIED WITH: T. GREEN, RPHARMD (WL) AT 0905 ON 12/30/17 BY C. JESSUP, MLT.    Proteus species NOT DETECTED NOT DETECTED Final   Serratia marcescens NOT DETECTED NOT DETECTED Final   Carbapenem resistance NOT DETECTED NOT DETECTED Final   Haemophilus influenzae NOT DETECTED NOT DETECTED Final   Neisseria meningitidis NOT DETECTED NOT DETECTED Final   Pseudomonas aeruginosa NOT DETECTED NOT DETECTED Final   Candida albicans NOT DETECTED NOT DETECTED Final   Candida glabrata NOT DETECTED NOT DETECTED Final   Candida krusei NOT DETECTED NOT DETECTED Final   Candida parapsilosis NOT DETECTED NOT DETECTED Final   Candida tropicalis NOT DETECTED NOT DETECTED Final    Comment: Performed at [facility], [place] [address] [place], [place] [address]  Culture, Blood     Status: Abnormal (Preliminary result)   Collection Time: 12/29/17 11:47 AM  Result Value Ref Range Status   Specimen Description BLOOD RIGHT ARM  Final   Special Requests   Final    BOTTLES DRAWN AEROBIC AND ANAEROBIC Blood Culture  adequate volume   Culture  Setup Time   Final    GRAM NEGATIVE RODS IN BOTH AEROBIC AND ANAEROBIC BOTTLES CRITICAL RESULT CALLED TO, READ BACK BY AND VERIFIED WITH: T. GREEN, RPHARMD (WL) AT 4627 ON 12/30/17 BY C. JESSUP, MLT.    Culture (A)  Final    KLEBSIELLA PNEUMONIAE SUSCEPTIBILITIES PERFORMED ON PREVIOUS CULTURE WITHIN THE LAST 5 DAYS. Performed at [facility], [place] [address], [place], [place] [address]    Report Status PENDING  Incomplete  Urine Culture     Status: None   Collection Time: 12/29/17  2:10 PM  Result Value Ref Range Status   Specimen Description   Final    URINE, RANDOM Performed at [facility], [place] [address] [place], [place] [address]    Special Requests   Final    NONE Performed at [facility], [place] [address], [place], [place] [address]    Culture   Final    NO GROWTH Performed at [facility], [place] [address], [place], [place] [address]    Report Status 12/30/2017 FINAL  Final  MRSA PCR Screening     Status: None   Collection Time: 12/29/17  6:50 PM  Result Value Ref Range Status   MRSA by PCR NEGATIVE NEGATIVE Final    Comment:        The GeneXpert MRSA Assay (FDA approved for NASAL specimens only), is one component of a comprehensive MRSA colonization surveillance program. It is not intended to diagnose MRSA infection nor to guide or monitor treatment for MRSA infections. Performed at [facility], [place] [address] [place], [place] [address]     RADIOLOGY STUDIES/RESULTS: Dg Chest 2 View  Result Date: 12/19/2017 CLINICAL  DATA:  Chest pain.  Shortness of breath. EXAM: CHEST - 2 VIEW COMPARISON:  Chest CTA today.  Chest radiographs 12/02/2017. FINDINGS: A left subclavian Port-A-Cath terminates over the mid SVC, unchanged. The cardiomediastinal silhouette is within normal limits. No airspace consolidation, edema, pleural effusion, or pneumothorax is identified.  There is slight chronic interstitial coarsening. A biliary drain is noted in the upper abdomen. There has been prior left shoulder arthroplasty. IMPRESSION: No active cardiopulmonary disease. Electronically Signed   By: Logan Bores M.D.   On: 12/19/2017 17:23   Dg Chest 2 View  Result Date: 12/02/2017 CLINICAL DATA:  Flu like symptoms EXAM: CHEST - 2 VIEW COMPARISON:  Chest CT 01/25/2017 and CXR 01/25/2017 FINDINGS: The heart size and mediastinal contours are within normal limits. Chronic mild interstitial prominence without alveolar consolidation, dominant mass, effusion or pneumothorax. Left-sided port catheter is noted with tip in the mid SVC. No acute osseous abnormality. Partially visualized left shoulder arthroplasty. IMPRESSION: No active cardiopulmonary disease. Electronically Signed   By: Ashley Royalty M.D.   On: 12/02/2017 17:16   Ct Angio Chest Pe W And/or Wo Contrast  Result Date: 12/19/2017 CLINICAL DATA:  Chest pain. Dyspnea. Pancreatic cancer status post Whipple procedure, with recent percutaneous transhepatic biliary drainage catheter placement. EXAM: CT ANGIOGRAPHY CHEST WITH CONTRAST TECHNIQUE: Multidetector CT imaging of the chest was performed using the standard protocol during bolus administration of intravenous contrast. Multiplanar CT image reconstructions and MIPs were obtained to evaluate the vascular anatomy. CONTRAST:  143mL ISOVUE-370 IOPAMIDOL (ISOVUE-370) INJECTION 76% COMPARISON:  07/19/2017 PET-CT.  01/25/2017 chest CT angiogram. FINDINGS: Cardiovascular: The study is high quality for the evaluation of pulmonary embolism. There are no filling defects in the central, lobar, segmental or subsegmental pulmonary artery branches to suggest acute pulmonary embolism. Atherosclerotic thoracic aorta with stable ectatic 4.0 cm ascending thoracic aorta. Normal caliber pulmonary arteries. Normal heart size. No significant pericardial fluid/thickening. Left subclavian MediPort terminates in  middle third of the superior vena cava. Mediastinum/Nodes: No discrete thyroid nodules. Unremarkable esophagus. No pathologically enlarged axillary, mediastinal or hilar lymph nodes. Lungs/Pleura: No pneumothorax. No pleural effusion. No acute consolidative airspace disease or lung masses. Bilateral upper lobe 4 mm pulmonary nodules (series 7/image 25 on the right and image 24 on the left) are both stable since 01/25/2017 chest CT and probably benign. No new significant pulmonary nodules. Upper abdomen: Partially visualized percutaneous left transhepatic internal external biliary drainage catheter is seen terminating within the afferent biliary drainage limb. Partially visualized postsurgical changes from Whipple surgery. Musculoskeletal: No aggressive appearing focal osseous lesions. Partially visualized left shoulder arthroplasty. Chronic mild T3, T4 and T5 vertebral compression deformities are unchanged. Moderate thoracic spondylosis. Review of the MIP images confirms the above findings. IMPRESSION: 1. No pulmonary embolism. 2. No findings suspicious for metastatic disease in the chest. Tiny bilateral upper lobe pulmonary nodules are stable since 01/25/2017 chest CT and probably benign. 3. Stable ectatic 4.0 cm ascending thoracic aorta. Recommend annual imaging followup by CTA or MRA. This recommendation follows 2010 ACCF/AHA/AATS/ACR/ASA/SCA/SCAI/SIR/STS/SVM Guidelines for the Diagnosis and Management of Patients with Thoracic Aortic Disease. Circulation. 2010; 121: C166-A630. 4. Partially visualized left percutaneous transhepatic internal external biliary drainage catheter in position. Partially visualized post Whipple change in the upper abdomen. Aortic Atherosclerosis (ICD10-I70.0). Electronically Signed   By: Ilona Sorrel M.D.   On: 12/19/2017 17:36   Ct Abdomen Pelvis W Contrast  Result Date: 12/02/2017 CLINICAL DATA:  Acute abdominal pain. Hypotension. Fever/chills. Pancreatic cancer diagnosed 2015.  Endometrial cancer diagnosed  2012. EXAM: CT ABDOMEN AND PELVIS WITH CONTRAST TECHNIQUE: Multidetector CT imaging of the abdomen and pelvis was performed using the standard protocol following bolus administration of intravenous contrast. CONTRAST:  157mL ISOVUE-300 IOPAMIDOL (ISOVUE-300) INJECTION 61% COMPARISON:  11/18/2017. FINDINGS: Lower chest: Lung bases are clear. Hepatobiliary: No suspicious/enhancing hepatic lesions. Status post Whipple procedure with choledochojejunostomy. Mild intrahepatic and extrahepatic ductal dilatation. Dilated common duct, measuring 14 mm centrally (coronal image 44, previously 11 mm. Pancreas: Status post Whipple procedure with pancreaticojejunostomy. Atrophy of the residual pancreatic body/tail with mild pancreatic duct dilatation (series 2/image 24). Spleen: Within normal limits. Adrenals/Urinary Tract: Adrenal glands within normal limits. 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 Left kidney is within normal limits. No hydronephrosis. Bladder is within normal limits. Stomach/Bowel: Status post partial gastrectomy with gastrojejunostomy. Mildly dilated afferent limb in the right upper quadrant (series 2/image 29), although with improved wall thickening when compared to the prior CT. No evidence of bowel obstruction. Appendix is not discretely visualized. Vascular/Lymphatic: No evidence of abdominal aortic aneurysm. Atherosclerotic calcifications of the abdominal aorta and branch vessels. No suspicious abdominopelvic lymphadenopathy. Reproductive: Status post hysterectomy. No adnexal masses. Other: No abdominopelvic ascites. Musculoskeletal: Mild superior endplate Schmorl's node deformities at T10, T11, and L5. IMPRESSION: Mildly progressive intrahepatic and extrahepatic ductal dilatation, as described above. Associated dilatation of the afferent limb. 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 GI/surgical consultation is suggested. No  findings specific for recurrent or metastatic disease in this patient with history of pancreatic cancer and endometrial cancer. Electronically Signed   By: Julian Hy M.D.   On: 12/02/2017 15:13   Mr 3d Recon At Scanner  Result Date: 12/03/2017 CLINICAL DATA:  Evaluate for pancreatitis. History of pancreas cancer. Now with ongoing abdominal pain. Progressive biliary dilatation. EXAM: MRI ABDOMEN WITHOUT AND WITH CONTRAST (INCLUDING MRCP) TECHNIQUE: Multiplanar multisequence MR imaging of the abdomen was performed both before and after the administration of intravenous contrast. Heavily T2-weighted images of the biliary and pancreatic ducts were obtained, and three-dimensional MRCP images were rendered by post processing. CONTRAST:  34mL MULTIHANCE GADOBENATE DIMEGLUMINE 529 MG/ML IV SOLN COMPARISON:  12/02/2017 FINDINGS: Lower chest: No acute findings. Hepatobiliary: Diffusely heterogeneous enhancement pattern of the liver is identified. There is a focal area of increased T2 signal within the region of the caudate lobe of liver which measures 2.4 by 2.5 cm, image 23/3. This demonstrates relative increased enhancement compared with the remainder of the liver, image 48/1301. Within the remaining portions of the liver there is heterogeneous attenuation and enhancement. There is marked intrahepatic bile duct dilatation. This demonstrates significant progression when compared with 11/18/17. Pancreas: Residual pancreas is largely atrophic with dilatation of the main duct, image 59/1303. The main duct measures up to 7 mm. Spleen:  Within normal limits in size and appearance. Adrenals/Urinary Tract: The adrenal glands are normal. Unremarkable appearance of the kidneys. Stomach/Bowel: The stomach and the remaining small bowel loops are unremarkable. Dilatation of the hepaticojejunostomy loop is identified. There appears to be marked narrowing of the hepaticojejunostomy loop at the point at which the crosses the  midline (from right to left) between the aorta and SMV, image 72/1303. Findings may reflect underlying postsurgical scarring. Recurrent tumor not excluded. Vascular/Lymphatic:  Normal appearance of the abdominal aorta. Other:  No ascites or focal fluid collections identified. Musculoskeletal: No suspicious bone lesions identified. IMPRESSION: 1. Postoperative changes from Whipple procedure again noted. On today's study there is marked and progressive dilatation of the hepaticojejunostomy loop, bile  ducts and main pancreatic duct. Focal area of narrowing of the hepaticojejunostomy loop as it crosses the midline from right to left. Findings may represent underlying postsurgical scarring and stricture formation versus recurrent tumor. 2. There is a focal area of abnormal signal in the region of the caudate lobe of liver with increased enhancement. It is unclear whether not this is abnormal signal and enhancement within the liver parenchyma or adenopathy. A PET-CT may be helpful to assess for recurrent metabolically active tumor. Electronically Signed   By: Kerby Moors M.D.   On: 12/03/2017 08:37   Dg Sinus/fist Tube Chk-non Gi  Result Date: 12/18/2017 INDICATION: 72 year old female with a history of pancreatic cancer, status post Whipple procedure 08/30/2014. The patient was treated for possible bile obstruction with hospitalization and internal/external biliary drainage performed 12/05/2017. The differential still includes afferent limb syndrome, as a cause for the bile obstruction. Internal/external drainage tube was exchanged 12/09/2017 with a bile duct cytology sample acquired, which has formally resulted as a negative biopsy for tumor. EXAM: THROUGH THE TUBE CHOLANGIOGRAM VIA A LEFT SIDED PERCUTANEOUS TRANSHEPATIC INTERNAL/EXTERNAL DRAIN MEDICATIONS: None ANESTHESIA/SEDATION: None COMPLICATIONS: None PROCEDURE: Informed written consent was obtained from the patient after a thorough discussion of the  procedural risks, benefits and alternatives. All questions were addressed. Maximal Sterile Barrier Technique was utilized including caps, mask, sterile gowns, sterile gloves, sterile drape, hand hygiene and skin antiseptic. A timeout was performed prior to the initiation of the procedure. Patient was positioned supine position on the fluoroscopy table. Scout images were acquired of the upper abdomen. Injection of contrast was performed under fluoroscopy. Multiple images were acquired of the intrahepatic and extrahepatic biliary system with obliquities achieved to confirm patency of the surgical anastomosis. 5-10 minute delayed imaging was performed of the abdomen to determine if there was downstream migration of contrast through the duodenum into the jejunum. The catheter was then attached to gravity drainage. Patient tolerated the procedure well and remained hemodynamically stable throughout. No complications were encountered and no significant blood loss. FINDINGS: Sideholes of the catheter remain within the left-sided ducts, although there has been some withdrawal of the biliary drain. Contrast injected through the drain rapidly transits the surgical anastomosis which is demonstrated on oblique image to appear widely patent around the drain. Distal drain remains within the remnant afferent limb. Delayed imaging at 5-10 minutes demonstrates no evidence of contrast traversing the afferent limb into the jejunum, with mild dilation observed. IMPRESSION: Status post through the tube cholangiogram demonstrates adequate positioning of the internal/external drain. The injection confirms patency of the biliary-enteric surgical anastomosis. 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 Signed, Dulcy Fanny. Earleen Newport DO [facility] Radiology PLAN: The patient was left to gravity drainage. She  has upcoming surgical appointment April 8th. Electronically Signed   By: Corrie Mckusick D.O.   On: 12/18/2017 13:01   Dg Chest Portable 1 View  Result Date: 12/29/2017 CLINICAL DATA:  States possible infection related to obstructed biliary duct-drain has only put out 200 cc in 2 days-fever. EXAM: PORTABLE CHEST 1 VIEW COMPARISON:  12/19/2017 FINDINGS: There is no focal parenchymal opacity. There is no pleural effusion or pneumothorax. The heart and mediastinal contours are unremarkable. There is a right-sided Port-A-Cath with the tip projecting over the SVC. There is generalized osteopenia. There is no aggressive osseous lesion. There is a left shoulder arthroplasty. IMPRESSION: No active disease. Electronically Signed   By:  Kathreen Devoid   On: 12/29/2017 15:37   Dg Abd 2 Views  Result Date: 12/29/2017 CLINICAL DATA:  History of endometrial cancer. No bowel movement for 3 days. Pain. EXAM: ABDOMEN - 2 VIEW COMPARISON:  CT AP 12/19/2017. FINDINGS: Percutaneous biliary drain is again noted with pigtail overlying the right upper quadrant of the abdomen. There is no abnormal bowel dilatation. A moderate stool burden is identified within the colon and rectum. IMPRESSION: 1. Nonobstructive bowel gas pattern. Electronically Signed   By: Kerby Moors M.D.   On: 12/29/2017 19:32   Mr Abdomen Mrcp Moise Boring Contast  Result Date: 12/03/2017 CLINICAL DATA:  Evaluate for pancreatitis. History of pancreas cancer. Now with ongoing abdominal pain. Progressive biliary dilatation. EXAM: MRI ABDOMEN WITHOUT AND WITH CONTRAST (INCLUDING MRCP) TECHNIQUE: Multiplanar multisequence MR imaging of the abdomen was performed both before and after the administration of intravenous contrast. Heavily T2-weighted images of the biliary and pancreatic ducts were obtained, and three-dimensional MRCP images were rendered by post processing. CONTRAST:  37mL MULTIHANCE GADOBENATE DIMEGLUMINE 529 MG/ML IV SOLN COMPARISON:  12/02/2017 FINDINGS: Lower  chest: No acute findings. Hepatobiliary: Diffusely heterogeneous enhancement pattern of the liver is identified. There is a focal area of increased T2 signal within the region of the caudate lobe of liver which measures 2.4 by 2.5 cm, image 23/3. This demonstrates relative increased enhancement compared with the remainder of the liver, image 48/1301. Within the remaining portions of the liver there is heterogeneous attenuation and enhancement. There is marked intrahepatic bile duct dilatation. This demonstrates significant progression when compared with 11/18/17. Pancreas: Residual pancreas is largely atrophic with dilatation of the main duct, image 59/1303. The main duct measures up to 7 mm. Spleen:  Within normal limits in size and appearance. Adrenals/Urinary Tract: The adrenal glands are normal. Unremarkable appearance of the kidneys. Stomach/Bowel: The stomach and the remaining small bowel loops are unremarkable. Dilatation of the hepaticojejunostomy loop is identified. There appears to be marked narrowing of the hepaticojejunostomy loop at the point at which the crosses the midline (from right to left) between the aorta and SMV, image 72/1303. Findings may reflect underlying postsurgical scarring. Recurrent tumor not excluded. Vascular/Lymphatic:  Normal appearance of the abdominal aorta. Other:  No ascites or focal fluid collections identified. Musculoskeletal: No suspicious bone lesions identified. IMPRESSION: 1. Postoperative changes from Whipple procedure again noted. On today's study there is marked and progressive dilatation of the hepaticojejunostomy loop, bile ducts and main pancreatic duct. Focal area of narrowing of the hepaticojejunostomy loop as it crosses the midline from right to left. Findings may represent underlying postsurgical scarring and stricture formation versus recurrent tumor. 2. There is a focal area of abnormal signal in the region of the caudate lobe of liver with increased  enhancement. It is unclear whether not this is abnormal signal and enhancement within the liver parenchyma or adenopathy. A PET-CT may be helpful to assess for recurrent metabolically active tumor. Electronically Signed   By: Kerby Moors M.D.   On: 12/03/2017 08:37   Ir Int Lianne Cure Biliary Drain With Cholangiogram  Result Date: 12/05/2017 INDICATION: Pancreatic carcinoma status post Whipple, now with epigastric pain and dilatation of the hepaticojejunostomy loop and bile ducts. Elevated bilirubin. Decompression is requested. EXAM: PERCUTANEOUS TRANSHEPATIC CHOLANGIOGRAM PERCUTANEOUS INTERNAL/EXTERNAL BILIARY DRAIN MEDICATIONS: Patient was already receiving adequate prophylactic antibiotic coverage. ANESTHESIA/SEDATION: Intravenous Fentanyl and Versed were administered as conscious sedation during continuous monitoring of the patient's level of consciousness and physiological / cardiorespiratory status by the radiology RN,  with a total moderate sedation time of 23 minutes. PROCEDURE: Informed written consent was obtained from the patient after a thorough discussion of the procedural risks, benefits and alternatives. All questions were addressed. Maximal Sterile Barrier Technique was utilized including caps, mask, sterile gowns, sterile gloves, sterile drape, hand hygiene and skin antiseptic. A timeout was performed prior to the initiation of the procedure. Limited ultrasound confirmed intrahepatic biliary ductal dilatation in the left lobe. An appropriate skin entry site was determined. Under real-time ultrasound guidance, a 21 gauge needle advanced into a dilated peripheral left lateral segment duct. Bowel spontaneously returned through the needle hub. A 018 guidewire advanced centrally easily. A 6 French transitional dilator was advanced over the wire. Contrast injection for percutaneous transhepatic cholangiogram was performed. This confirmed placement within the central intrahepatic biliary tree. There is  intra and extrahepatic biliary ductal dilatation. Contrast did flow across the anastomosis at the hepaticojejunostomy, with dilatation of the bowel. The dilator was exchanged over a Bentson wire for a 5 [place] Kumpe catheter, advanced into the jejunal loop. Catheter exchanged over an Amplatz wire for vascular dilator which facilitated advancement of a 10 [place] internal external biliary drain catheter, placed with sideholes spanning the intrahepatic biliary tree, extending into the anastomosed jejunal loop. Contrast injection confirmed good position and patency. The catheter was secured externally with 0 Prolene suture and StatLock and placed to external drainage. The patient tolerated the procedure well. FLUOROSCOPY TIME:  2 minutes 54 seconds; 47 mGy COMPLICATIONS: None immediate IMPRESSION: 1. Percutaneous transhepatic cholangiogram demonstrates intra and extrahepatic biliary ductal dilatation with patency of the hepaticojejunostomy anastomosis. 2. Technically successful internal/external biliary drain catheter placement as above. PLAN: Allow biliary decompression with internal /external drainage. Follow bilirubin. We can do a follow-up cholangiogram next week to assess the level of obstruction, possible brush biopsy. Electronically Signed   By: Lucrezia Europe M.D.   On: 12/05/2017 17:36   Ir Exchange Biliary Drain  Result Date: 12/30/2017 INDICATION: 72 year old female with a history of pancreatic cancer status post Whipple resection and choledocho jejunostomy. She has and existing 10 [place] percutaneous biliary drain which passes through the patent choledocho jejunostomy and into the jejunum. However, there is concern for obstruction of the draining jejunal loop. 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 EXAM: Biliary tube exchange and cholangiogram MEDICATIONS: None. Patient is currently an inpatient and receiving intravenous antibiotics ANESTHESIA/SEDATION: None  FLUOROSCOPY TIME:  Fluoroscopy Time: 7 minutes 18 seconds (106 mGy). COMPLICATIONS: None immediate. PROCEDURE: Informed written consent was obtained from the patient after a thorough discussion of the procedural risks, benefits and alternatives. All questions were addressed. Maximal Sterile Barrier Technique was utilized including caps, mask, sterile gowns, sterile gloves, sterile drape, hand hygiene and skin antiseptic. A timeout was performed prior to the initiation of the procedure. An initial cholangiogram was attempted through the existing tube. However, the tube is pulled back and the injected contrast came out the sideholes just deep to the skin surface. Therefore, the retention suture was cut in the tube was transected. A Bentson wire was advanced through the tube and into the jejunum. The tube was removed. A Kumpe the catheter was advanced over the wire and into the jejunum. Contrast injection was performed. The Kumpe the catheter was initially in the blind limb of the jejunal loop. The catheter was brought back and an angled roadrunner wire was used to navigate the catheter into the draining limb of the jejunal loop. Contrast injection demonstrates a dilated  proximal draining limb of the jejunum. No contrast material passes into the more distal jejunum. There is a suspected high-grade stenosis/stricture. With significant manipulation, ultimately the catheter and wire were successfully navigated beyond the obstruction and into the transverse portion of the jejunal loop. Contrast injection through the catheter confirms that this represents the jejunum distal to the obstruction. There is good peristalsis and forward flow of contrast. The roadrunner wire was advanced into the jejunum. A new 12 French cook internal/external biliary drainage catheter was modified with several additional sideholes. The catheter was then advanced over the wire and formed with the locking loop in the jejunum distal to the jejunal  obstruction. The most proximal side hole is located within the left hepatic ducts. The catheter was secured to the skin with an adhesive fixation device. IMPRESSION: 1. 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 2. Successful placement of a modified internal/external biliary drainage catheter with the distal loop in the jejunum beyond the obstruction. The most proximal side holes are within the left intrahepatic biliary ducts. This tube should allow drainage of the biliary system and the obstructed segment of the jejunum. PLAN: 1. Maintain to gravity bag drainage for at least 24 hours before attempting capping. 2. Tentative plan to return Interventional Radiology in 8 weeks for biliary tube check and exchange. If the patient develops problems with the tube prior to that, she should call to be seen sooner. Signed, Criselda Peaches, MD [facility] Radiology Electronically Signed   By: Jacqulynn Cadet M.D.   On: 12/30/2017 17:32   Ir Exchange Biliary Drain  Result Date: 12/09/2017 INDICATION: Status post Whipple procedure, biliary obstruction EXAM: CHOLANGIOGRAM THROUGH EXISTING CATHETER TRANSCATHETER BREAST BIOPSY OF THE BILE DUCT ANASTOMOSIS REPLACEMENT OF THE LEFT 10 FRENCH INTERNAL EXTERNAL BILIARY DRAIN MEDICATIONS: Patient is inpatient.  IV antibiotics are currently infusion. ANESTHESIA/SEDATION: Moderate (conscious) sedation was employed during this procedure. A total of Versed 2.0 mg and Fentanyl 100 mcg was administered intravenously. Moderate Sedation Time: 30 minutes. The patient's level of consciousness and vital signs were monitored continuously by radiology nursing throughout the procedure under my direct supervision. FLUOROSCOPY TIME:  Fluoroscopy Time: 10 minutes 54 seconds (564 mGy). COMPLICATIONS: None immediate. PROCEDURE: Informed written consent was obtained  from the patient after a thorough discussion of the procedural risks, benefits and alternatives. All questions were addressed. Maximal Sterile Barrier Technique was utilized including caps, mask, sterile gowns, sterile gloves, sterile drape, hand hygiene and skin antiseptic. A timeout was performed prior to the initiation of the procedure. Under sterile conditions and local anesthesia, the existing left internal external biliary drain was injected with contrast for cholangiogram. Cholangiogram: This demonstrates improvement in the diffuse biliary dilatation extending to the bile duct anastomosis to the afferent loop. Catheter was cut and removed. 8 French sheath advanced over Amplatz guidewire. Bile duct anastomosis brushings: Alongside the Amplatz guidewire, bile duct brush biopsy performed 3 times through the 8 French sheath. Images obtained for documentation. Sample sent for cytology. The Biliary drain replacement: Ten [place] internal external biliary drain replaced over the Amplatz guidewire. Retention loop formed in the afferent loop across the bile duct anastomosis. Ducts were decompressed by syringe aspiration. Images obtained for documentation. Catheter secured with a Prolene suture. Patient tolerated the procedure well. No immediate complication. IMPRESSION: Initial cholangiogram demonstrates improvement in biliary dilatation following drainage. There is a mild bile duct anastomosis stricture noted during the cholangiogram, contrast does pass  through the bile duct anastomosis around the biliary drain into the afferent loop. The afferent loop remains dilated and contrast does not drain down stream within the small bowel. 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 Successful bile duct anastomosis brush biopsy for cytology Replacement of the left internal external 10 [place] biliary drain. Electronically Signed   By: Jerilynn Mages.  Shick M.D.   On: 12/09/2017 13:49   Ir Endoluminal Bx Of Biliary Tree  Result  Date: 12/09/2017 INDICATION: Status post Whipple procedure, biliary obstruction EXAM: CHOLANGIOGRAM THROUGH EXISTING CATHETER TRANSCATHETER BREAST BIOPSY OF THE BILE DUCT ANASTOMOSIS REPLACEMENT OF THE LEFT 10 FRENCH INTERNAL EXTERNAL BILIARY DRAIN MEDICATIONS: Patient is inpatient.  IV antibiotics are currently infusion. ANESTHESIA/SEDATION: Moderate (conscious) sedation was employed during this procedure. A total of Versed 2.0 mg and Fentanyl 100 mcg was administered intravenously. Moderate Sedation Time: 30 minutes. The patient's level of consciousness and vital signs were monitored continuously by radiology nursing throughout the procedure under my direct supervision. FLUOROSCOPY TIME:  Fluoroscopy Time: 10 minutes 54 seconds (564 mGy). COMPLICATIONS: None immediate. PROCEDURE: Informed written consent was obtained from the patient after a thorough discussion of the procedural risks, benefits and alternatives. All questions were addressed. Maximal Sterile Barrier Technique was utilized including caps, mask, sterile gowns, sterile gloves, sterile drape, hand hygiene and skin antiseptic. A timeout was performed prior to the initiation of the procedure. Under sterile conditions and local anesthesia, the existing left internal external biliary drain was injected with contrast for cholangiogram. Cholangiogram: This demonstrates improvement in the diffuse biliary dilatation extending to the bile duct anastomosis to the afferent loop. Catheter was cut and removed. 8 French sheath advanced over Amplatz guidewire. Bile duct anastomosis brushings: Alongside the Amplatz guidewire, bile duct brush biopsy performed 3 times through the 8 French sheath. Images obtained for documentation. Sample sent for cytology. The Biliary drain replacement: Ten [place] internal external biliary drain replaced over the Amplatz guidewire. Retention loop formed in the afferent loop across the bile duct anastomosis. Ducts were decompressed by  syringe aspiration. Images obtained for documentation. Catheter secured with a Prolene suture. Patient tolerated the procedure well. No immediate complication. IMPRESSION: Initial cholangiogram demonstrates improvement in biliary dilatation following drainage. 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 The afferent loop remains dilated and contrast does not drain down stream within the small bowel. 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 Successful bile duct anastomosis brush biopsy for cytology Replacement of the left internal external 10 [place] biliary drain. Electronically Signed   By: Jerilynn Mages.  Shick M.D.   On: 12/09/2017 13:49   Ir Radiologist Eval & Mgmt  Result Date: 12/18/2017 Please refer to notes tab for details about interventional procedure. (Op Note)  US Abdomen Limited Ruq  Result Date: 12/02/2017 CLINICAL DATA:  Abdominal pain.  History of pancreatic carcinoma EXAM: ULTRASOUND ABDOMEN LIMITED RIGHT UPPER QUADRANT COMPARISON:  CT abdomen and pelvis November 18, 2017 FINDINGS: Gallbladder: Surgically absent. Common bile duct: Diameter: 11 mm, prominent. No biliary duct mass or calculus is appreciable by ultrasound. Liver: No focal lesion identified. Liver echogenicity overall is increased. There is intrahepatic biliary duct dilatation. Portal vein is patent on color Doppler imaging with normal direction of blood flow towards the liver. IMPRESSION: 1. Gallbladder absent. There is biliary duct dilatation, similar to recent CT. No biliary duct mass or calculus evident. 2. Diffuse increase in liver echogenicity, a  finding felt to be indicative of hepatic steatosis. While no focal liver lesions are evident, it must be cautioned that the sensitivity of ultrasound for detection of focal liver lesions is diminished significantly in this circumstance.  Electronically Signed   By: Lowella Grip III M.D.   On: 12/02/2017 14:19     LOS: 3 days   Oren Binet, MD  [facility] Pager:[phone]  If 7PM-7AM, please contact night-coverage [email or web address] Password [email or web address] 01/01/2018, 1:36 PM

## 2018-01-01 NOTE — Progress Notes (Signed)
Patient called this RN into room and stated that her dressing where her drain is was leaking. Upon assessment, it appeared that the dressing was slightly wet. She did report increased abdominal pain. Patients drain had been clamped off for a trial, but was reconnected and immediately put out about 279mL of green liquid substance. New dressing was applied around tube. Patient felt relief. T. Opyd MD made aware of situation. No new orders received. Will continue to monitor patient closely.

## 2018-01-02 DIAGNOSIS — I480 Paroxysmal atrial fibrillation: Secondary | ICD-10-CM

## 2018-01-02 DIAGNOSIS — A419 Sepsis, unspecified organism: Secondary | ICD-10-CM

## 2018-01-02 LAB — COMPREHENSIVE METABOLIC PANEL
ALK PHOS: 420 U/L — AB (ref 38–126)
ALT: 22 U/L (ref 14–54)
AST: 39 U/L (ref 15–41)
Albumin: 2.4 g/dL — ABNORMAL LOW (ref 3.5–5.0)
Anion gap: 8 (ref 5–15)
BUN: 5 mg/dL — ABNORMAL LOW (ref 6–20)
CALCIUM: 8.1 mg/dL — AB (ref 8.9–10.3)
CO2: 27 mmol/L (ref 22–32)
CREATININE: 0.46 mg/dL (ref 0.44–1.00)
Chloride: 100 mmol/L — ABNORMAL LOW (ref 101–111)
Glucose, Bld: 89 mg/dL (ref 65–99)
Potassium: 3.8 mmol/L (ref 3.5–5.1)
Sodium: 135 mmol/L (ref 135–145)
TOTAL PROTEIN: 5.2 g/dL — AB (ref 6.5–8.1)
Total Bilirubin: 0.9 mg/dL (ref 0.3–1.2)

## 2018-01-02 LAB — CULTURE, BLOOD (SINGLE): SPECIAL REQUESTS: ADEQUATE

## 2018-01-02 LAB — GLUCOSE, CAPILLARY: GLUCOSE-CAPILLARY: 81 mg/dL (ref 65–99)

## 2018-01-02 MED ORDER — HEPARIN SOD (PORK) LOCK FLUSH 100 UNIT/ML IV SOLN
500.0000 [IU] | INTRAVENOUS | Status: AC | PRN
  Administered 2018-01-02: 500 [IU]

## 2018-01-02 MED FILL — NORMAL SALINE FLUSH SYRINGE: 0.9 | 15 days supply | Qty: 300 | Fill #0

## 2018-01-02 NOTE — Discharge Summary (Signed)
PATIENT DETAILS Name: [name] Age: 72 y.o. Sex: female Date of Birth: 11/27/1945 MRN: [id number]. Admitting Physician: Eugenie Filler, MD OIZ:[id number], Rebeca Alert, MD  Admit Date: 12/29/2017 Discharge date: 01/02/2018  Recommendations for Outpatient Follow-up:  1. Follow up with PCP in 1-2 weeks 2. Please obtain CMP/CBC weekly by home health RN while patient has the biliary drain in place 3. Please repeat blood cultures once patient completes a course of antimicrobial therapy 4. Please ensure outpatient follow-up with interventional radiology, general surgery, oncology  Admitted From:  Home  Disposition: Home with home health services   Home Health: Yes  Equipment/Devices: 4/9>>Revision of left biliary drainage catheter. A new 2F int/ext biliary drain modified with additional sideholes was successfully placed across the jejunal stricture  Discharge Condition: Stable  CODE STATUS: FULL CODE  Diet recommendation:  Heart Healthy  Brief Summary: See H&P, Labs, Consult and Test reports for all details in brief, Patient is a 72 y.o. female history of paroxysmal atrial fibrillation, pancreatic cancer is post Whipple's procedure-admitted with sepsis secondary to gram-negative bacteremia due to a biliary source.  See below for further details  Brief Hospital Course: Sepsis secondary to Klebsiella bacteremia/cholangitis: Sepsis pathophysiology has resolved-blood culture positive for Klebsiella-sensitive to Augmentin.  Patient was managed with IV Rocephin.  It appears that the patient recently had Bacteroides bacteremia and March.  Patient was placed on Augmentin and Flagyl for 3 weeks a few days prior to this hospitalization by general surgery-I suspect due to concern for bacterial overgrowth/afferent loop syndrome-since patient has significantly improved-and Klebsiella is sensitive to Augmentin-I think it is reasonable for her to just continue Augmentin until 4/23 as  planned by her outpatient surgeon Dr. Barry Dienes.  Patient was followed during this hospital stay by interventional radiology and general surgery.  Patient had revision of the biliary drain during this hospital stay-home health services have been ordered for drain management, and also to monitor CBC/chemistries weekly as recommended by interventional radiology.  Patient aware that she will need follow-up with IR and general surgery along with oncology upon discharge.  Spoke with radiology PA on the day of discharge-they will coordinate outpatient care with the patient.  Paroxysmal atrial fibrillation: Continue flecainide-Xarelto resumed on 4/11 after discussion with general surgery.    Pancreatic cancer: Follows with Dr.Sherill in the outpatient setting-is status post Whipple's procedure in 2015.  Recent biliary brushings have been negative for malignancy.  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  Severe protein calorie malnutrition:  Managed with supplements  Anemia: Secondary to critical illness-no indication for transfusion-follow for now.  No evidence of blood loss  Hyponatremia: Mild-stable for follow-up without any further workup  Hypertension: Blood pressure currently controlled without the use of any antihypertensives-follow for now.  Resume metoprolol when able  Anxiety/depression: Appears to be stable-continue amitriptyline and as needed lorazepam  Procedures/Studies: 4/9>>Revision of left biliary drainage catheter. A new 2F int/ext biliary drain modified with additional sideholes was successfully placed across the jejunal stricture.  Discharge Diagnoses:  Principal Problem:   Hypotension Active Problems:   HTN (hypertension)   Hyponatremia   Malignant neoplasm of pancreas (HCC)   Atrial  fibrillation with RVR post op-    Protein-calorie malnutrition, severe (HCC)   Anemia   Anxiety   Dehydration   Paroxysmal atrial fibrillation (HCC)   Chronic atrial fibrillation ([place])  Sepsis [facility])   Discharge Instructions:  Activity:  As tolerated with Full fall precautions use walker/cane & assistance as needed   Discharge Instructions    Diet - low sodium heart healthy   Complete by:  As directed    Discharge instructions   Complete by:  As directed    As outpatient recommend flushing of drain 1-2 times per day with 5-10 cc sterile normal saline, output recording and dressing changes every 1-2 days.  Patient should call interventional radiology at [phone] and speak with interventional radiologist on call with any drain related questions  Follow with Primary MD  Chesley Noon, MD, Dr. Barry Dienes in 1 week.   Please call interventional radiology and make an appointment in the next few weeks.  And other consultant's as instructed your Hospitalist MD  Please get a complete blood count and chemistry panel checked by your Primary MD at your next visit, and again as instructed by your Primary MD.  Get Medicines reviewed and adjusted: Please take all your medications with you for your next visit with your Primary MD  Laboratory/radiological data: Please request your Primary MD to go over all hospital tests and procedure/radiological results at the follow up, please ask your Primary MD to get all Hospital records sent to his/her office.  In some cases, they will be blood work, cultures and biopsy results pending at the time of your discharge. Please request that your primary care M.D. follows up on these results.  Also Note the following: If you experience worsening of your admission symptoms, develop shortness of breath, life threatening emergency, suicidal or homicidal thoughts you must seek medical attention immediately by calling 911 or calling your MD immediately  if  symptoms less severe.  You must read complete instructions/literature along with all the possible adverse reactions/side effects for all the Medicines you take and that have been prescribed to you. Take any new Medicines after you have completely understood and accpet all the possible adverse reactions/side effects.   Do not drive when taking Pain medications or sleeping medications (Benzodaizepines)  Do not take more than prescribed Pain, Sleep and Anxiety Medications. It is not advisable to combine anxiety,sleep and pain medications without talking with your primary care practitioner  Special Instructions: If you have smoked or chewed Tobacco  in the last 2 yrs please stop smoking, stop any regular Alcohol  and or any Recreational drug use.  Wear Seat belts while driving.  Please note: You were cared for by a hospitalist during your hospital stay. Once you are discharged, your primary care physician will handle any further medical issues. Please note that NO REFILLS for any discharge medications will be authorized once you are discharged, as it is imperative that you return to your primary care physician (or establish a relationship with a primary care physician if you do not have one) for your post hospital discharge needs so that they can reassess your need for medications and monitor your lab values.   Increase activity slowly   Complete by:  As directed      Allergies as of 01/02/2018      Reactions   Ace Inhibitors Cough   Scopolamine Other (See Comments)   Dizzy, "lost control of my body", fell down and cracked a rib   Sulfa Antibiotics Hives      Medication List    TAKE these medications   acetaminophen 500 MG tablet Commonly known as:  TYLENOL Take 1,000 mg by mouth every 6 (six)  hours as needed for pain.   albuterol 108 (90 Base) MCG/ACT inhaler Commonly known as:  PROVENTIL HFA;VENTOLIN HFA Inhale 2 puffs into the lungs every 6 (six) hours as needed for wheezing or  shortness of breath.   amitriptyline 25 MG tablet Commonly known as:  ELAVIL Take 50 mg by mouth at bedtime.   amoxicillin-clavulanate 875-125 MG tablet Commonly known as:  AUGMENTIN TAKE 1 TABLET BY MOUTH TWICE A DAY FOR 3 WEEKS   diphenhydrAMINE 25 MG tablet Commonly known as:  BENADRYL Take 25 mg by mouth at bedtime as needed for sleep.   ENSURE PLUS Liqd Take 237 mLs by mouth 3 (three) times daily as needed (nutrition).   flecainide 100 MG tablet Commonly known as:  TAMBOCOR TAKE 1 TABLET BY MOUTH TWICE A DAY What changed:    how much to take  how to take this  when to take this   fluticasone 50 MCG/ACT nasal spray Commonly known as:  FLONASE Place 1 spray into both nostrils 2 (two) times daily as needed for allergies.   HYDROcodone-acetaminophen 5-325 MG tablet Commonly known as:  NORCO/VICODIN Take 1-2 tablets by mouth every 4 (four) hours as needed for moderate pain.   lidocaine-prilocaine cream Commonly known as:  EMLA Apply small amount over port area 1-2 hours prior to treatment and cover with plastic wrap.  DO NOT RUB IN.   LORazepam 1 MG tablet Commonly known as:  ATIVAN Take 0.5 tablets (0.5 mg total) by mouth every 8 (eight) hours as needed for anxiety. What changed:  how much to take   metoprolol succinate 25 MG 24 hr tablet Commonly known as:  TOPROL-XL Take 12.5 mg by mouth daily.   metroNIDAZOLE 500 MG tablet Commonly known as:  FLAGYL Take 500 mg by mouth 3 (three) times daily.   morphine 15 MG tablet Commonly known as:  MSIR Take 1 tablet (15 mg total) by mouth every 4 (four) hours as needed for severe pain.   MUCINEX 600 MG 12 hr tablet Generic drug:  guaiFENesin Take 600 mg by mouth every morning.   promethazine 12.5 MG tablet Commonly known as:  PHENERGAN Take 1 tablet (12.5 mg total) by mouth every 6 (six) hours as needed for nausea or vomiting.   sodium chloride flush 0.9 % Soln injection USE EVERY DAY   XARELTO 20 MG Tabs  tablet Generic drug:  rivaroxaban TAKE 1 TABLET BY MOUTH EVERY DAY What changed:    how much to take  how to take this  when to take this   zolpidem 6.25 MG CR tablet Commonly known as:  AMBIEN CR Take 1 tablet (6.25 mg total) by mouth at bedtime.      Follow-up Information    Chesley Noon, MD. Schedule an appointment as soon as possible for a visit in 1 week(s).   Specialty:  Family Medicine Contact information: [place] [address] [phone]        Jerline Pain, MD .   Specialty:  Cardiology Contact information: [id number] [place] [address] [phone]        Ladell Pier, MD Follow up.   Specialty:  Oncology Why:  keep scheduled appt Contact information: [place] [address] [phone]        [name], MD. Schedule an appointment as soon as possible for a visit in 1 week(s).   Specialty:  General Surgery Contact information: [address]  [address] [place]  [address] [phone]          Allergies  Allergen Reactions  . Ace Inhibitors Cough  . Scopolamine Other (See Comments)    Dizzy, "lost control of my body", fell down and cracked a rib  . Sulfa Antibiotics Hives    Consultations:   general surgery IR   Other Procedures/Studies: Dg Chest 2 View  Result Date: 12/19/2017 CLINICAL DATA:  Chest pain.  Shortness of breath. EXAM: CHEST - 2 VIEW COMPARISON:  Chest CTA today.  Chest radiographs 12/02/2017. FINDINGS: A left subclavian Port-A-Cath terminates over the mid SVC, unchanged. The cardiomediastinal silhouette is within normal limits. No airspace consolidation, edema, pleural effusion, or pneumothorax is identified. There is slight chronic interstitial coarsening. A biliary drain is noted in the upper abdomen. There has been prior left shoulder arthroplasty. IMPRESSION: No active cardiopulmonary disease. Electronically Signed   By: Logan Bores M.D.   On: 12/19/2017 17:23   Ct Angio Chest Pe W And/or Wo Contrast  Result Date: 12/19/2017 CLINICAL DATA:  Chest pain. Dyspnea. Pancreatic cancer status post Whipple procedure, with recent percutaneous transhepatic biliary drainage catheter placement. EXAM: CT ANGIOGRAPHY CHEST WITH CONTRAST TECHNIQUE: Multidetector CT imaging of the chest was performed using the standard protocol during bolus administration of intravenous contrast. Multiplanar CT image reconstructions and MIPs were obtained to evaluate the vascular anatomy. CONTRAST:  156mL ISOVUE-370 IOPAMIDOL (ISOVUE-370) INJECTION 76% COMPARISON:  07/19/2017 PET-CT.  01/25/2017 chest CT angiogram. FINDINGS: Cardiovascular: The study is high quality for the evaluation of pulmonary embolism. There are no filling defects in the central, lobar, segmental or subsegmental pulmonary artery branches to suggest acute pulmonary embolism. Atherosclerotic thoracic aorta with stable ectatic 4.0 cm ascending thoracic aorta. Normal caliber pulmonary arteries. Normal heart size. No significant pericardial fluid/thickening. Left subclavian MediPort terminates in middle third of the superior vena cava. Mediastinum/Nodes: No discrete thyroid nodules. Unremarkable esophagus. No pathologically enlarged axillary, mediastinal or hilar lymph nodes. Lungs/Pleura: No pneumothorax. No pleural effusion. No acute consolidative airspace disease or lung masses. Bilateral upper lobe 4 mm pulmonary nodules (series 7/image 25 on the right and image 24 on the left) are both stable since 01/25/2017 chest CT and probably benign. No new significant pulmonary nodules. Upper abdomen: Partially visualized percutaneous left transhepatic internal external biliary drainage catheter is seen terminating within the afferent biliary drainage limb. Partially visualized postsurgical changes from Whipple surgery. Musculoskeletal: No aggressive appearing focal osseous lesions. Partially visualized  left shoulder arthroplasty. Chronic mild T3, T4 and T5 vertebral compression deformities are unchanged. Moderate thoracic spondylosis. Review of the MIP images confirms the above findings. IMPRESSION: 1. No pulmonary embolism. 2. No findings suspicious for metastatic disease in the chest. Tiny bilateral upper lobe pulmonary nodules are stable since 01/25/2017 chest CT and probably benign. 3. Stable ectatic 4.0 cm ascending thoracic aorta. Recommend annual imaging followup by CTA or MRA. This recommendation follows 2010 ACCF/AHA/AATS/ACR/ASA/SCA/SCAI/SIR/STS/SVM Guidelines for the Diagnosis and Management of Patients with Thoracic Aortic Disease. Circulation. 2010; 121: C623-J628. 4. Partially visualized left percutaneous transhepatic internal external biliary drainage catheter in position. Partially visualized post Whipple change in the upper abdomen. Aortic Atherosclerosis (ICD10-I70.0). Electronically Signed   By: Ilona Sorrel M.D.   On: 12/19/2017 17:36   Dg Sinus/fist Tube Chk-non Gi  Result Date: 12/18/2017 INDICATION: 72 year old female with a history of pancreatic cancer, status post Whipple procedure 08/30/2014. The patient was treated for possible bile obstruction with hospitalization and internal/external biliary drainage performed 12/05/2017. The differential still includes afferent  limb syndrome, as a cause for the bile obstruction. Internal/external drainage tube was exchanged 12/09/2017 with a bile duct cytology sample acquired, which has formally resulted as a negative biopsy for tumor. EXAM: THROUGH THE TUBE CHOLANGIOGRAM VIA A LEFT SIDED PERCUTANEOUS TRANSHEPATIC INTERNAL/EXTERNAL DRAIN MEDICATIONS: None ANESTHESIA/SEDATION: None COMPLICATIONS: None PROCEDURE: Informed written consent was obtained from the patient after a thorough discussion of the procedural risks, benefits and alternatives. All questions were addressed. Maximal Sterile Barrier Technique was utilized including caps, mask,  sterile gowns, sterile gloves, sterile drape, hand hygiene and skin antiseptic. A timeout was performed prior to the initiation of the procedure. Patient was positioned supine position on the fluoroscopy table. Scout images were acquired of the upper abdomen. Injection of contrast was performed under fluoroscopy. Multiple images were acquired of the intrahepatic and extrahepatic biliary system with obliquities achieved to confirm patency of the surgical anastomosis. 5-10 minute delayed imaging was performed of the abdomen to determine if there was downstream migration of contrast through the duodenum into the jejunum. The catheter was then attached to gravity drainage. Patient tolerated the procedure well and remained hemodynamically stable throughout. No complications were encountered and no significant blood loss. FINDINGS: Sideholes of the catheter remain within the left-sided ducts, although there has been some withdrawal of the biliary drain. Contrast injected through the drain rapidly transits the surgical anastomosis which is demonstrated on oblique image to appear widely patent around the drain. Distal drain remains within the remnant afferent limb. Delayed imaging at 5-10 minutes demonstrates no evidence of contrast traversing the afferent limb into the jejunum, with mild dilation observed. IMPRESSION: Status post through the tube cholangiogram demonstrates adequate positioning of the internal/external drain. The injection confirms patency of the biliary-enteric surgical anastomosis. 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 Signed, Dulcy Fanny. Earleen Newport DO [facility] PLAN: The patient was left to gravity drainage. She has upcoming surgical appointment April 8th. Electronically Signed   By: Corrie Mckusick D.O.   On: 12/18/2017 13:01   Dg Chest Portable 1  View  Result Date: 12/29/2017 CLINICAL DATA:  States possible infection related to obstructed biliary duct-drain has only put out 200 cc in 2 days-fever. EXAM: PORTABLE CHEST 1 VIEW COMPARISON:  12/19/2017 FINDINGS: There is no focal parenchymal opacity. There is no pleural effusion or pneumothorax. The heart and mediastinal contours are unremarkable. There is a right-sided Port-A-Cath with the tip projecting over the SVC. There is generalized osteopenia. There is no aggressive osseous lesion. There is a left shoulder arthroplasty. IMPRESSION: No active disease. Electronically Signed   By: Kathreen Devoid   On: 12/29/2017 15:37   Dg Abd 2 Views  Result Date: 12/29/2017 CLINICAL DATA:  History of endometrial cancer. No bowel movement for 3 days. Pain. EXAM: ABDOMEN - 2 VIEW COMPARISON:  CT AP 12/19/2017. FINDINGS: Percutaneous biliary drain is again noted with pigtail overlying the right upper quadrant of the abdomen. There is no abnormal bowel dilatation. A moderate stool burden is identified within the colon and rectum. IMPRESSION: 1. Nonobstructive bowel gas pattern. Electronically Signed   By: Kerby Moors M.D.   On: 12/29/2017 19:32   Ir Int Lianne Cure Biliary Drain With Cholangiogram  Result Date: 12/05/2017 INDICATION: Pancreatic carcinoma status post Whipple, now with epigastric pain and dilatation of the hepaticojejunostomy loop and bile ducts. Elevated bilirubin. Decompression is requested. EXAM: PERCUTANEOUS TRANSHEPATIC CHOLANGIOGRAM PERCUTANEOUS INTERNAL/EXTERNAL BILIARY DRAIN MEDICATIONS: Patient was  already receiving adequate prophylactic antibiotic coverage. ANESTHESIA/SEDATION: Intravenous Fentanyl and Versed were administered as conscious sedation during continuous monitoring of the patient's level of consciousness and physiological / cardiorespiratory status by the radiology RN, with a total moderate sedation time of 23 minutes. PROCEDURE: Informed written consent was obtained from the patient after  a thorough discussion of the procedural risks, benefits and alternatives. All questions were addressed. Maximal Sterile Barrier Technique was utilized including caps, mask, sterile gowns, sterile gloves, sterile drape, hand hygiene and skin antiseptic. A timeout was performed prior to the initiation of the procedure. Limited ultrasound confirmed intrahepatic biliary ductal dilatation in the left lobe. An appropriate skin entry site was determined. Under real-time ultrasound guidance, a 21 gauge needle advanced into a dilated peripheral left lateral segment duct. Bowel spontaneously returned through the needle hub. A 018 guidewire advanced centrally easily. A 6 French transitional dilator was advanced over the wire. Contrast injection for percutaneous transhepatic cholangiogram was performed. This confirmed placement within the central intrahepatic biliary tree. There is intra and extrahepatic biliary ductal dilatation. Contrast did flow across the anastomosis at the hepaticojejunostomy, with dilatation of the bowel. The dilator was exchanged over a Bentson wire for a 5 [place] Kumpe catheter, advanced into the jejunal loop. Catheter exchanged over an Amplatz wire for vascular dilator which facilitated advancement of a 10 [place] internal external biliary drain catheter, placed with sideholes spanning the intrahepatic biliary tree, extending into the anastomosed jejunal loop. Contrast injection confirmed good position and patency. The catheter was secured externally with 0 Prolene suture and StatLock and placed to external drainage. The patient tolerated the procedure well. FLUOROSCOPY TIME:  2 minutes 54 seconds; 47 mGy COMPLICATIONS: None immediate IMPRESSION: 1. Percutaneous transhepatic cholangiogram demonstrates intra and extrahepatic biliary ductal dilatation with patency of the hepaticojejunostomy anastomosis. 2. Technically successful internal/external biliary drain catheter placement as above. PLAN: Allow  biliary decompression with internal /external drainage. Follow bilirubin. We can do a follow-up cholangiogram next week to assess the level of obstruction, possible brush biopsy. Electronically Signed   By: Lucrezia Europe M.D.   On: 12/05/2017 17:36   Ir Exchange Biliary Drain  Result Date: 12/30/2017 INDICATION: 72 year old female with a history of pancreatic cancer status post Whipple resection and choledocho jejunostomy. She has and existing 10 [place] percutaneous biliary drain which passes through the patent choledocho jejunostomy and into the jejunum. However, there is concern for obstruction of the draining jejunal loop. 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 EXAM: Biliary tube exchange and cholangiogram MEDICATIONS: None. Patient is currently an inpatient and receiving intravenous antibiotics ANESTHESIA/SEDATION: None FLUOROSCOPY TIME:  Fluoroscopy Time: 7 minutes 18 seconds (106 mGy). COMPLICATIONS: None immediate. PROCEDURE: Informed written consent was obtained from the patient after a thorough discussion of the procedural risks, benefits and alternatives. All questions were addressed. Maximal Sterile Barrier Technique was utilized including caps, mask, sterile gowns, sterile gloves, sterile drape, hand hygiene and skin antiseptic. A timeout was performed prior to the initiation of the procedure. An initial cholangiogram was attempted through the existing tube. However, the tube is pulled back and the injected contrast came out the sideholes just deep to the skin surface. Therefore, the retention suture was cut in the tube was transected. A Bentson wire was advanced through the tube and into the jejunum. The tube was removed. A Kumpe the catheter was advanced over the wire and into the jejunum. Contrast injection was performed. The Kumpe the catheter was initially in the blind  limb of the jejunal loop. The catheter was brought back and an angled roadrunner  wire was used to navigate the catheter into the draining limb of the jejunal loop. Contrast injection demonstrates a dilated proximal draining limb of the jejunum. No contrast material passes into the more distal jejunum. There is a suspected high-grade stenosis/stricture. With significant manipulation, ultimately the catheter and wire were successfully navigated beyond the obstruction and into the transverse portion of the jejunal loop. Contrast injection through the catheter confirms that this represents the jejunum distal to the obstruction. There is good peristalsis and forward flow of contrast. The roadrunner wire was advanced into the jejunum. A new 12 French cook internal/external biliary drainage catheter was modified with several additional sideholes. The catheter was then advanced over the wire and formed with the locking loop in the jejunum distal to the jejunal obstruction. The most proximal side hole is located within the left hepatic ducts. The catheter was secured to the skin with an adhesive fixation device. IMPRESSION: 1. 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 2. Successful placement of a modified internal/external biliary drainage catheter with the distal loop in the jejunum beyond the obstruction. The most proximal side holes are within the left intrahepatic biliary ducts. This tube should allow drainage of the biliary system and the obstructed segment of the jejunum. PLAN: 1. Maintain to gravity bag drainage for at least 24 hours before attempting capping. 2. Tentative plan to return Interventional Radiology in 8 weeks for biliary tube check and exchange. If the patient develops problems with the tube prior to that, she should call to be seen sooner. Signed, Criselda Peaches, MD [facility] Radiology Electronically Signed   By: Jacqulynn Cadet M.D.   On:  12/30/2017 17:32   Ir Exchange Biliary Drain  Result Date: 12/09/2017 INDICATION: Status post Whipple procedure, biliary obstruction EXAM: CHOLANGIOGRAM THROUGH EXISTING CATHETER TRANSCATHETER BREAST BIOPSY OF THE BILE DUCT ANASTOMOSIS REPLACEMENT OF THE LEFT 10 FRENCH INTERNAL EXTERNAL BILIARY DRAIN MEDICATIONS: Patient is inpatient.  IV antibiotics are currently infusion. ANESTHESIA/SEDATION: Moderate (conscious) sedation was employed during this procedure. A total of Versed 2.0 mg and Fentanyl 100 mcg was administered intravenously. Moderate Sedation Time: 30 minutes. The patient's level of consciousness and vital signs were monitored continuously by radiology nursing throughout the procedure under my direct supervision. FLUOROSCOPY TIME:  Fluoroscopy Time: 10 minutes 54 seconds (564 mGy). COMPLICATIONS: None immediate. PROCEDURE: Informed written consent was obtained from the patient after a thorough discussion of the procedural risks, benefits and alternatives. All questions were addressed. Maximal Sterile Barrier Technique was utilized including caps, mask, sterile gowns, sterile gloves, sterile drape, hand hygiene and skin antiseptic. A timeout was performed prior to the initiation of the procedure. Under sterile conditions and local anesthesia, the existing left internal external biliary drain was injected with contrast for cholangiogram. Cholangiogram: This demonstrates improvement in the diffuse biliary dilatation extending to the bile duct anastomosis to the afferent loop. Catheter was cut and removed. 8 French sheath advanced over Amplatz guidewire. Bile duct anastomosis brushings: Alongside the Amplatz guidewire, bile duct brush biopsy performed 3 times through the 8 French sheath. Images obtained for documentation. Sample sent for cytology. The Biliary drain replacement: Ten [place] internal external biliary drain replaced over the Amplatz guidewire. Retention loop formed in the afferent loop across  the bile duct anastomosis. Ducts were decompressed by syringe aspiration. Images obtained for documentation. Catheter secured with a  Prolene suture. Patient tolerated the procedure well. No immediate complication. IMPRESSION: Initial cholangiogram demonstrates improvement in biliary dilatation following drainage. 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 The afferent loop remains dilated and contrast does not drain down stream within the small bowel. 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 Successful bile duct anastomosis brush biopsy for cytology Replacement of the left internal external 10 [place] biliary drain. Electronically Signed   By: Jerilynn Mages.  Shick M.D.   On: 12/09/2017 13:49   Ir Endoluminal Bx Of Biliary Tree  Result Date: 12/09/2017 INDICATION: Status post Whipple procedure, biliary obstruction EXAM: CHOLANGIOGRAM THROUGH EXISTING CATHETER TRANSCATHETER BREAST BIOPSY OF THE BILE DUCT ANASTOMOSIS REPLACEMENT OF THE LEFT 10 FRENCH INTERNAL EXTERNAL BILIARY DRAIN MEDICATIONS: Patient is inpatient.  IV antibiotics are currently infusion. ANESTHESIA/SEDATION: Moderate (conscious) sedation was employed during this procedure. A total of Versed 2.0 mg and Fentanyl 100 mcg was administered intravenously. Moderate Sedation Time: 30 minutes. The patient's level of consciousness and vital signs were monitored continuously by radiology nursing throughout the procedure under my direct supervision. FLUOROSCOPY TIME:  Fluoroscopy Time: 10 minutes 54 seconds (564 mGy). COMPLICATIONS: None immediate. PROCEDURE: Informed written consent was obtained from the patient after a thorough discussion of the procedural risks, benefits and alternatives. All questions were addressed. Maximal Sterile Barrier Technique was utilized including caps, mask, sterile gowns, sterile gloves,  sterile drape, hand hygiene and skin antiseptic. A timeout was performed prior to the initiation of the procedure. Under sterile conditions and local anesthesia, the existing left internal external biliary drain was injected with contrast for cholangiogram. Cholangiogram: This demonstrates improvement in the diffuse biliary dilatation extending to the bile duct anastomosis to the afferent loop. Catheter was cut and removed. 8 French sheath advanced over Amplatz guidewire. Bile duct anastomosis brushings: Alongside the Amplatz guidewire, bile duct brush biopsy performed 3 times through the 8 French sheath. Images obtained for documentation. Sample sent for cytology. The Biliary drain replacement: Ten [place] internal external biliary drain replaced over the Amplatz guidewire. Retention loop formed in the afferent loop across the bile duct anastomosis. Ducts were decompressed by syringe aspiration. Images obtained for documentation. Catheter secured with a Prolene suture. Patient tolerated the procedure well. No immediate complication. IMPRESSION: Initial cholangiogram demonstrates improvement in biliary dilatation following drainage. 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 The afferent loop remains dilated and contrast does not drain down stream within the small bowel. 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 Successful bile duct anastomosis brush biopsy for cytology Replacement of the left internal external 10 [place] biliary drain. Electronically Signed   By: Jerilynn Mages.  Shick M.D.   On: 12/09/2017 13:49   Ir Radiologist Eval & Mgmt  Result Date: 12/18/2017 Please refer to notes tab for details about interventional procedure. (Op Note)    TODAY-DAY OF DISCHARGE:  Subjective:   Jaedan Schuman today has no headache,no chest abdominal pain,no new weakness tingling  or numbness, feels much better wants to go home today.  Objective:   Blood pressure 113/76, pulse 70, temperature 98 F (36.7 C), temperature source Oral, resp. rate 18, height 5\' 2"  (1.575 m), weight 67.8 kg (149 lb 6.4 oz), SpO2 99 %.  Intake/Output Summary (Last 24 hours) at 01/02/2018 1015 Last data filed at 01/02/2018  0600 Gross per 24 hour  Intake 480 ml  Output 895 ml  Net -415 ml   Filed Weights   12/30/17 0600 01/01/18 0543 01/02/18 0453  Weight: 64.1 kg (141 lb 5 oz) 68.2 kg (150 lb 4.8 oz) 67.8 kg (149 lb 6.4 oz)    Exam: Awake Alert, Oriented *3, No new F.N deficits, Normal affect [place].AT,PERRAL Supple Neck,No JVD, No cervical lymphadenopathy appriciated.  Symmetrical Chest wall movement, Good air movement bilaterally, CTAB RRR,No Gallops,Rubs or new Murmurs, No Parasternal Heave +ve B.Sounds, Abd Soft, Non tender, No organomegaly appriciated, No rebound -guarding or rigidity. No Cyanosis, Clubbing or edema, No new Rash or bruise   PERTINENT RADIOLOGIC STUDIES: Dg Chest 2 View  Result Date: 12/19/2017 CLINICAL DATA:  Chest pain.  Shortness of breath. EXAM: CHEST - 2 VIEW COMPARISON:  Chest CTA today.  Chest radiographs 12/02/2017. FINDINGS: A left subclavian Port-A-Cath terminates over the mid SVC, unchanged. The cardiomediastinal silhouette is within normal limits. No airspace consolidation, edema, pleural effusion, or pneumothorax is identified. There is slight chronic interstitial coarsening. A biliary drain is noted in the upper abdomen. There has been prior left shoulder arthroplasty. IMPRESSION: No active cardiopulmonary disease. Electronically Signed   By: Logan Bores M.D.   On: 12/19/2017 17:23   Ct Angio Chest Pe W And/or Wo Contrast  Result Date: 12/19/2017 CLINICAL DATA:  Chest pain. Dyspnea. Pancreatic cancer status post Whipple procedure, with recent percutaneous transhepatic biliary drainage catheter placement. EXAM: CT ANGIOGRAPHY CHEST WITH CONTRAST  TECHNIQUE: Multidetector CT imaging of the chest was performed using the standard protocol during bolus administration of intravenous contrast. Multiplanar CT image reconstructions and MIPs were obtained to evaluate the vascular anatomy. CONTRAST:  147mL ISOVUE-370 IOPAMIDOL (ISOVUE-370) INJECTION 76% COMPARISON:  07/19/2017 PET-CT.  01/25/2017 chest CT angiogram. FINDINGS: Cardiovascular: The study is high quality for the evaluation of pulmonary embolism. There are no filling defects in the central, lobar, segmental or subsegmental pulmonary artery branches to suggest acute pulmonary embolism. Atherosclerotic thoracic aorta with stable ectatic 4.0 cm ascending thoracic aorta. Normal caliber pulmonary arteries. Normal heart size. No significant pericardial fluid/thickening. Left subclavian MediPort terminates in middle third of the superior vena cava. Mediastinum/Nodes: No discrete thyroid nodules. Unremarkable esophagus. No pathologically enlarged axillary, mediastinal or hilar lymph nodes. Lungs/Pleura: No pneumothorax. No pleural effusion. No acute consolidative airspace disease or lung masses. Bilateral upper lobe 4 mm pulmonary nodules (series 7/image 25 on the right and image 24 on the left) are both stable since 01/25/2017 chest CT and probably benign. No new significant pulmonary nodules. Upper abdomen: Partially visualized percutaneous left transhepatic internal external biliary drainage catheter is seen terminating within the afferent biliary drainage limb. Partially visualized postsurgical changes from Whipple surgery. Musculoskeletal: No aggressive appearing focal osseous lesions. Partially visualized left shoulder arthroplasty. Chronic mild T3, T4 and T5 vertebral compression deformities are unchanged. Moderate thoracic spondylosis. Review of the MIP images confirms the above findings. IMPRESSION: 1. No pulmonary embolism. 2. No findings suspicious for metastatic disease in the chest. Tiny bilateral  upper lobe pulmonary nodules are stable since 01/25/2017 chest CT and probably benign. 3. Stable ectatic 4.0 cm ascending thoracic aorta. Recommend annual imaging followup by CTA or MRA. This recommendation follows 2010 ACCF/AHA/AATS/ACR/ASA/SCA/SCAI/SIR/STS/SVM Guidelines for the Diagnosis and Management of Patients with Thoracic Aortic Disease. Circulation. 2010; 121: K160-F093. 4. Partially visualized left percutaneous transhepatic internal external biliary drainage catheter in position. Partially visualized post Whipple change in the upper abdomen. Aortic Atherosclerosis (ICD10-I70.0). Electronically Signed   By:  Ilona Sorrel M.D.   On: 12/19/2017 17:36   Dg Sinus/fist Tube Chk-non Gi  Result Date: 12/18/2017 INDICATION: 72 year old female with a history of pancreatic cancer, status post Whipple procedure 08/30/2014. The patient was treated for possible bile obstruction with hospitalization and internal/external biliary drainage performed 12/05/2017. The differential still includes afferent limb syndrome, as a cause for the bile obstruction. Internal/external drainage tube was exchanged 12/09/2017 with a bile duct cytology sample acquired, which has formally resulted as a negative biopsy for tumor. EXAM: THROUGH THE TUBE CHOLANGIOGRAM VIA A LEFT SIDED PERCUTANEOUS TRANSHEPATIC INTERNAL/EXTERNAL DRAIN MEDICATIONS: None ANESTHESIA/SEDATION: None COMPLICATIONS: None PROCEDURE: Informed written consent was obtained from the patient after a thorough discussion of the procedural risks, benefits and alternatives. All questions were addressed. Maximal Sterile Barrier Technique was utilized including caps, mask, sterile gowns, sterile gloves, sterile drape, hand hygiene and skin antiseptic. A timeout was performed prior to the initiation of the procedure. Patient was positioned supine position on the fluoroscopy table. Scout images were acquired of the upper abdomen. Injection of contrast was performed under  fluoroscopy. Multiple images were acquired of the intrahepatic and extrahepatic biliary system with obliquities achieved to confirm patency of the surgical anastomosis. 5-10 minute delayed imaging was performed of the abdomen to determine if there was downstream migration of contrast through the duodenum into the jejunum. The catheter was then attached to gravity drainage. Patient tolerated the procedure well and remained hemodynamically stable throughout. No complications were encountered and no significant blood loss. FINDINGS: Sideholes of the catheter remain within the left-sided ducts, although there has been some withdrawal of the biliary drain. Contrast injected through the drain rapidly transits the surgical anastomosis which is demonstrated on oblique image to appear widely patent around the drain. Distal drain remains within the remnant afferent limb. Delayed imaging at 5-10 minutes demonstrates no evidence of contrast traversing the afferent limb into the jejunum, with mild dilation observed. IMPRESSION: Status post through the tube cholangiogram demonstrates adequate positioning of the internal/external drain. The injection confirms patency of the biliary-enteric surgical anastomosis. 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 Signed, Dulcy Fanny. Earleen Newport DO [facility] Radiology PLAN: The patient was left to gravity drainage. She has upcoming surgical appointment April 8th. Electronically Signed   By: Corrie Mckusick D.O.   On: 12/18/2017 13:01   Dg Chest Portable 1 View  Result Date: 12/29/2017 CLINICAL DATA:  States possible infection related to obstructed biliary duct-drain has only put out 200 cc in 2 days-fever. EXAM: PORTABLE CHEST 1 VIEW COMPARISON:  12/19/2017 FINDINGS: There is no focal parenchymal opacity. There is no pleural effusion or pneumothorax. The  heart and mediastinal contours are unremarkable. There is a right-sided Port-A-Cath with the tip projecting over the SVC. There is generalized osteopenia. There is no aggressive osseous lesion. There is a left shoulder arthroplasty. IMPRESSION: No active disease. Electronically Signed   By: Kathreen Devoid   On: 12/29/2017 15:37   Dg Abd 2 Views  Result Date: 12/29/2017 CLINICAL DATA:  History of endometrial cancer. No bowel movement for 3 days. Pain. EXAM: ABDOMEN - 2 VIEW COMPARISON:  CT AP 12/19/2017. FINDINGS: Percutaneous biliary drain is again noted with pigtail overlying the right upper quadrant of the abdomen. There is no abnormal bowel dilatation. A moderate stool burden is identified within the colon and rectum. IMPRESSION: 1. Nonobstructive bowel gas pattern. Electronically Signed   By: Lovena Le  Clovis Riley M.D.   On: 12/29/2017 19:32   Ir Int Lianne Cure Biliary Drain With Cholangiogram  Result Date: 12/05/2017 INDICATION: Pancreatic carcinoma status post Whipple, now with epigastric pain and dilatation of the hepaticojejunostomy loop and bile ducts. Elevated bilirubin. Decompression is requested. EXAM: PERCUTANEOUS TRANSHEPATIC CHOLANGIOGRAM PERCUTANEOUS INTERNAL/EXTERNAL BILIARY DRAIN MEDICATIONS: Patient was already receiving adequate prophylactic antibiotic coverage. ANESTHESIA/SEDATION: Intravenous Fentanyl and Versed were administered as conscious sedation during continuous monitoring of the patient's level of consciousness and physiological / cardiorespiratory status by the radiology RN, with a total moderate sedation time of 23 minutes. PROCEDURE: Informed written consent was obtained from the patient after a thorough discussion of the procedural risks, benefits and alternatives. All questions were addressed. Maximal Sterile Barrier Technique was utilized including caps, mask, sterile gowns, sterile gloves, sterile drape, hand hygiene and skin antiseptic. A timeout was performed prior to the initiation of  the procedure. Limited ultrasound confirmed intrahepatic biliary ductal dilatation in the left lobe. An appropriate skin entry site was determined. Under real-time ultrasound guidance, a 21 gauge needle advanced into a dilated peripheral left lateral segment duct. Bowel spontaneously returned through the needle hub. A 018 guidewire advanced centrally easily. A 6 French transitional dilator was advanced over the wire. Contrast injection for percutaneous transhepatic cholangiogram was performed. This confirmed placement within the central intrahepatic biliary tree. There is intra and extrahepatic biliary ductal dilatation. Contrast did flow across the anastomosis at the hepaticojejunostomy, with dilatation of the bowel. The dilator was exchanged over a Bentson wire for a 5 [place] Kumpe catheter, advanced into the jejunal loop. Catheter exchanged over an Amplatz wire for vascular dilator which facilitated advancement of a 10 [place] internal external biliary drain catheter, placed with sideholes spanning the intrahepatic biliary tree, extending into the anastomosed jejunal loop. Contrast injection confirmed good position and patency. The catheter was secured externally with 0 Prolene suture and StatLock and placed to external drainage. The patient tolerated the procedure well. FLUOROSCOPY TIME:  2 minutes 54 seconds; 47 mGy COMPLICATIONS: None immediate IMPRESSION: 1. Percutaneous transhepatic cholangiogram demonstrates intra and extrahepatic biliary ductal dilatation with patency of the hepaticojejunostomy anastomosis. 2. Technically successful internal/external biliary drain catheter placement as above. PLAN: Allow biliary decompression with internal /external drainage. Follow bilirubin. We can do a follow-up cholangiogram next week to assess the level of obstruction, possible brush biopsy. Electronically Signed   By: Lucrezia Europe M.D.   On: 12/05/2017 17:36   Ir Exchange Biliary Drain  Result Date:  12/30/2017 INDICATION: 72 year old female with a history of pancreatic cancer status post Whipple resection and choledocho jejunostomy. She has and existing 10 [place] percutaneous biliary drain which passes through the patent choledocho jejunostomy and into the jejunum. However, there is concern for obstruction of the draining jejunal loop. 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 EXAM: Biliary tube exchange and cholangiogram MEDICATIONS: None. Patient is currently an inpatient and receiving intravenous antibiotics ANESTHESIA/SEDATION: None FLUOROSCOPY TIME:  Fluoroscopy Time: 7 minutes 18 seconds (106 mGy). COMPLICATIONS: None immediate. PROCEDURE: Informed written consent was obtained from the patient after a thorough discussion of the procedural risks, benefits and alternatives. All questions were addressed. Maximal Sterile Barrier Technique was utilized including caps, mask, sterile gowns, sterile gloves, sterile drape, hand hygiene and skin antiseptic. A timeout was performed prior to the initiation of the procedure. An initial cholangiogram was attempted through the existing tube. However, the tube is pulled back and the injected contrast came out the sideholes just deep to the  skin surface. Therefore, the retention suture was cut in the tube was transected. A Bentson wire was advanced through the tube and into the jejunum. The tube was removed. A Kumpe the catheter was advanced over the wire and into the jejunum. Contrast injection was performed. The Kumpe the catheter was initially in the blind limb of the jejunal loop. The catheter was brought back and an angled roadrunner wire was used to navigate the catheter into the draining limb of the jejunal loop. Contrast injection demonstrates a dilated proximal draining limb of the jejunum. No contrast material passes into the more distal jejunum. There is a suspected high-grade stenosis/stricture. With significant  manipulation, ultimately the catheter and wire were successfully navigated beyond the obstruction and into the transverse portion of the jejunal loop. Contrast injection through the catheter confirms that this represents the jejunum distal to the obstruction. There is good peristalsis and forward flow of contrast. The roadrunner wire was advanced into the jejunum. A new 12 French cook internal/external biliary drainage catheter was modified with several additional sideholes. The catheter was then advanced over the wire and formed with the locking loop in the jejunum distal to the jejunal obstruction. The most proximal side hole is located within the left hepatic ducts. The catheter was secured to the skin with an adhesive fixation device. IMPRESSION: 1. 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 2. Successful placement of a modified internal/external biliary drainage catheter with the distal loop in the jejunum beyond the obstruction. The most proximal side holes are within the left intrahepatic biliary ducts. This tube should allow drainage of the biliary system and the obstructed segment of the jejunum. PLAN: 1. Maintain to gravity bag drainage for at least 24 hours before attempting capping. 2. Tentative plan to return Interventional Radiology in 8 weeks for biliary tube check and exchange. If the patient develops problems with the tube prior to that, she should call to be seen sooner. Signed, Criselda Peaches, MD [facility] Radiology Electronically Signed   By: Jacqulynn Cadet M.D.   On: 12/30/2017 17:32   Ir Exchange Biliary Drain  Result Date: 12/09/2017 INDICATION: Status post Whipple procedure, biliary obstruction EXAM: CHOLANGIOGRAM THROUGH EXISTING CATHETER TRANSCATHETER BREAST BIOPSY OF THE BILE DUCT ANASTOMOSIS REPLACEMENT OF THE LEFT 10 FRENCH INTERNAL EXTERNAL  BILIARY DRAIN MEDICATIONS: Patient is inpatient.  IV antibiotics are currently infusion. ANESTHESIA/SEDATION: Moderate (conscious) sedation was employed during this procedure. A total of Versed 2.0 mg and Fentanyl 100 mcg was administered intravenously. Moderate Sedation Time: 30 minutes. The patient's level of consciousness and vital signs were monitored continuously by radiology nursing throughout the procedure under my direct supervision. FLUOROSCOPY TIME:  Fluoroscopy Time: 10 minutes 54 seconds (564 mGy). COMPLICATIONS: None immediate. PROCEDURE: Informed written consent was obtained from the patient after a thorough discussion of the procedural risks, benefits and alternatives. All questions were addressed. Maximal Sterile Barrier Technique was utilized including caps, mask, sterile gowns, sterile gloves, sterile drape, hand hygiene and skin antiseptic. A timeout was performed prior to the initiation of the procedure. Under sterile conditions and local anesthesia, the existing left internal external biliary drain was injected with contrast for cholangiogram. Cholangiogram: This demonstrates improvement in the diffuse biliary dilatation extending to the bile duct anastomosis to the afferent loop. Catheter was cut and removed. 8 French sheath advanced over Amplatz guidewire. Bile duct anastomosis brushings: Alongside the Amplatz guidewire, bile duct brush biopsy performed 3 times  through the 8 [place] sheath. Images obtained for documentation. Sample sent for cytology. The Biliary drain replacement: Ten [place] internal external biliary drain replaced over the Amplatz guidewire. Retention loop formed in the afferent loop across the bile duct anastomosis. Ducts were decompressed by syringe aspiration. Images obtained for documentation. Catheter secured with a Prolene suture. Patient tolerated the procedure well. No immediate complication. IMPRESSION: Initial cholangiogram demonstrates improvement in biliary  dilatation following drainage. 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 The afferent loop remains dilated and contrast does not drain down stream within the small bowel. 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 Successful bile duct anastomosis brush biopsy for cytology Replacement of the left internal external 10 [place] biliary drain. Electronically Signed   By: Jerilynn Mages.  Shick M.D.   On: 12/09/2017 13:49   Ir Endoluminal Bx Of Biliary Tree  Result Date: 12/09/2017 INDICATION: Status post Whipple procedure, biliary obstruction EXAM: CHOLANGIOGRAM THROUGH EXISTING CATHETER TRANSCATHETER BREAST BIOPSY OF THE BILE DUCT ANASTOMOSIS REPLACEMENT OF THE LEFT 10 FR[place] INTERNAL EXTERNAL BILIARY DRAIN MEDICATIONS: Patient is inpatient.  IV antibiotics are currently infusion. ANESTHESIA/SEDATION: Moderate (conscious) sedation was employed during this procedure. A total of Versed 2.0 mg and Fentanyl 100 mcg was administered intravenously. Moderate Sedation Time: 30 minutes. The patient's level of consciousness and vital signs were monitored continuously by radiology nursing throughout the procedure under my direct supervision. FLUOROSCOPY TIME:  Fluoroscopy Time: 10 minutes 54 seconds (564 mGy). COMPLICATIONS: None immediate. PROCEDURE: Informed written consent was obtained from the patient after a thorough discussion of the procedural risks, benefits and alternatives. All questions were addressed. Maximal Sterile Barrier Technique was utilized including caps, mask, sterile gowns, sterile gloves, sterile drape, hand hygiene and skin antiseptic. A timeout was performed prior to the initiation of the procedure. Under sterile conditions and local anesthesia, the existing left internal external biliary drain was injected with contrast for cholangiogram. Cholangiogram: This  demonstrates improvement in the diffuse biliary dilatation extending to the bile duct anastomosis to the afferent loop. Catheter was cut and removed. 8 French sheath advanced over Amplatz guidewire. Bile duct anastomosis brushings: Alongside the Amplatz guidewire, bile duct brush biopsy performed 3 times through the 8 French sheath. Images obtained for documentation. Sample sent for cytology. The Biliary drain replacement: Ten [place] internal external biliary drain replaced over the Amplatz guidewire. Retention loop formed in the afferent loop across the bile duct anastomosis. Ducts were decompressed by syringe aspiration. Images obtained for documentation. Catheter secured with a Prolene suture. Patient tolerated the procedure well. No immediate complication. IMPRESSION: Initial cholangiogram demonstrates improvement in biliary dilatation following drainage. 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 The afferent loop remains dilated and contrast does not drain down stream within the small bowel. 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 Successful bile duct anastomosis brush biopsy for cytology Replacement of the left internal external 10 [place] biliary drain. Electronically Signed   By: Jerilynn Mages.  Shick M.D.   On: 12/09/2017 13:49   Ir Radiologist Eval & Mgmt  Result Date: 12/18/2017 Please refer to notes tab for details about interventional procedure. (Op Note)    PERTINENT LAB RESULTS: CBC: Recent Labs    12/31/17 0500 01/01/18 0345  WBC 4.6 4.0  HGB 8.3* 8.3*  HCT 23.9*  23.5*  PLT 150 164   CMET CMP     Component Value Date/Time   NA 135 01/02/2018 0411   NA 138 06/03/2017 0937   K 3.8 01/02/2018 0411   K 4.0 06/03/2017 0937   CL 100 (L) 01/02/2018 0411   CO2 27 01/02/2018 0411   CO2 24 06/03/2017 0937   GLUCOSE 89 01/02/2018 0411    GLUCOSE 111 06/03/2017 0937   BUN 5 (L) 01/02/2018 0411   BUN 13.0 06/03/2017 0937   CREATININE 0.46 01/02/2018 0411   CREATININE 1.11 (H) 12/29/2017 1130   CREATININE 0.8 06/03/2017 0937   CALCIUM 8.1 (L) 01/02/2018 0411   CALCIUM 9.2 06/03/2017 0937   PROT 5.2 (L) 01/02/2018 0411   PROT 6.9 06/03/2017 0937   ALBUMIN 2.4 (L) 01/02/2018 0411   ALBUMIN 3.6 06/03/2017 0937   AST 39 01/02/2018 0411   AST 40 (H) 12/29/2017 1130   AST 55 (H) 06/03/2017 0937   ALT 22 01/02/2018 0411   ALT 24 12/29/2017 1130   ALT 38 06/03/2017 0937   ALKPHOS 420 (H) 01/02/2018 0411   ALKPHOS 142 06/03/2017 0937   BILITOT 0.9 01/02/2018 0411   BILITOT 1.9 (H) 12/29/2017 1130   BILITOT 0.56 06/03/2017 0937   GFRNONAA >60 01/02/2018 0411   GFRNONAA 49 (L) 12/29/2017 1130   GFRAA >60 01/02/2018 0411   GFRAA 57 (L) 12/29/2017 1130    GFR Estimated Creatinine Clearance: 59.1 mL/min (by C-G formula based on SCr of 0.46 mg/dL). No results for input(s): LIPASE, AMYLASE in the last 72 hours. No results for input(s): CKTOTAL, CKMB, CKMBINDEX, TROPONINI in the last 72 hours. Invalid input(s): POCBNP No results for input(s): DDIMER in the last 72 hours. No results for input(s): HGBA1C in the last 72 hours. No results for input(s): CHOL, HDL, LDLCALC, TRIG, CHOLHDL, LDLDIRECT in the last 72 hours. No results for input(s): TSH, T4TOTAL, T3FREE, THYROIDAB in the last 72 hours.  Invalid input(s): FREET3 No results for input(s): VITAMINB12, FOLATE, FERRITIN, TIBC, IRON, RETICCTPCT in the last 72 hours. Coags: No results for input(s): INR in the last 72 hours.  Invalid input(s): PT Microbiology: Recent Results (from the past 240 hour(s))  Culture, Blood     Status: Abnormal   Collection Time: 12/29/17 11:30 AM  Result Value Ref Range Status   Specimen Description BLOOD PORTA CATH  Final   Special Requests   Final    BOTTLES DRAWN AEROBIC AND ANAEROBIC Blood Culture results may not be optimal due to an  excessive volume of blood received in culture bottles   Culture  Setup Time   Final    GRAM NEGATIVE RODS IN BOTH AEROBIC AND ANAEROBIC BOTTLES CRITICAL RESULT CALLED TO, READ BACK BY AND VERIFIED WITH: T. GREEN, RPHARMD (WL) AT 0905 ON 12/30/17 BY C. JESSUP, MLT. Performed at [facility], [place] [address], [place], [place] [address]    Culture KLEBSIELLA PNEUMONIAE (A)  Final   Report Status 01/01/2018 FINAL  Final   Organism ID, Bacteria KLEBSIELLA PNEUMONIAE  Final      Susceptibility   Klebsiella pneumoniae - MIC*    AMPICILLIN >=32 RESISTANT Resistant     CEFAZOLIN <=4 SENSITIVE Sensitive     CEFEPIME <=1 SENSITIVE Sensitive     CEFTAZIDIME <=1 SENSITIVE Sensitive     CEFTRIAXONE <=1 SENSITIVE Sensitive     CIPROFLOXACIN <=0.25 SENSITIVE Sensitive     GENTAMICIN <=1 SENSITIVE Sensitive     IMIPENEM <=0.25 SENSITIVE Sensitive  TRIMETH/SULFA <=20 SENSITIVE Sensitive     AMPICILLIN/SULBACTAM 8 SENSITIVE Sensitive     PIP/TAZO <=4 SENSITIVE Sensitive     Extended ESBL NEGATIVE Sensitive     * KLEBSIELLA PNEUMONIAE  Blood Culture ID Panel (Reflexed)     Status: Abnormal   Collection Time: 12/29/17 11:30 AM  Result Value Ref Range Status   Enterococcus species NOT DETECTED NOT DETECTED Final   Listeria monocytogenes NOT DETECTED NOT DETECTED Final   Staphylococcus species NOT DETECTED NOT DETECTED Final   Staphylococcus aureus NOT DETECTED NOT DETECTED Final   Streptococcus species NOT DETECTED NOT DETECTED Final   Streptococcus agalactiae NOT DETECTED NOT DETECTED Final   Streptococcus pneumoniae NOT DETECTED NOT DETECTED Final   Streptococcus pyogenes NOT DETECTED NOT DETECTED Final   Acinetobacter baumannii NOT DETECTED NOT DETECTED Final   Enterobacteriaceae species DETECTED (A) NOT DETECTED Final    Comment: Enterobacteriaceae represent a large family of gram-negative bacteria, not a single organism. CRITICAL RESULT CALLED TO, READ BACK BY AND VERIFIED WITH: T.  GREEN, RPHARMD (WL) AT 1610 ON 12/30/17 BY C. JESSUP, MLT.    Enterobacter cloacae complex NOT DETECTED NOT DETECTED Final   Escherichia coli NOT DETECTED NOT DETECTED Final   Klebsiella oxytoca NOT DETECTED NOT DETECTED Final   Klebsiella pneumoniae DETECTED (A) NOT DETECTED Final    Comment: CRITICAL RESULT CALLED TO, READ BACK BY AND VERIFIED WITH: T. GREEN, RPHARMD (WL) AT 0905 ON 12/30/17 BY C. JESSUP, MLT.    Proteus species NOT DETECTED NOT DETECTED Final   Serratia marcescens NOT DETECTED NOT DETECTED Final   Carbapenem resistance NOT DETECTED NOT DETECTED Final   Haemophilus influenzae NOT DETECTED NOT DETECTED Final   Neisseria meningitidis NOT DETECTED NOT DETECTED Final   Pseudomonas aeruginosa NOT DETECTED NOT DETECTED Final   Candida albicans NOT DETECTED NOT DETECTED Final   Candida glabrata NOT DETECTED NOT DETECTED Final   Candida krusei NOT DETECTED NOT DETECTED Final   Candida parapsilosis NOT DETECTED NOT DETECTED Final   Candida tropicalis NOT DETECTED NOT DETECTED Final    Comment: Performed at [facility], [place] [address] [place], [place] [address]  Culture, Blood     Status: Abnormal   Collection Time: 12/29/17 11:47 AM  Result Value Ref Range Status   Specimen Description BLOOD RIGHT ARM  Final   Special Requests   Final    BOTTLES DRAWN AEROBIC AND ANAEROBIC Blood Culture adequate volume   Culture  Setup Time   Final    GRAM NEGATIVE RODS IN BOTH AEROBIC AND ANAEROBIC BOTTLES CRITICAL RESULT CALLED TO, READ BACK BY AND VERIFIED WITH: T. GREEN, RPHARMD (WL) AT 4098 ON 12/30/17 BY C. JESSUP, MLT.    Culture (A)  Final    KLEBSIELLA PNEUMONIAE SUSCEPTIBILITIES PERFORMED ON PREVIOUS CULTURE WITHIN THE LAST 5 DAYS. Performed at [facility], [place] [address], [place], [place] [address]    Report Status 01/02/2018 FINAL  Final  Urine Culture     Status: None   Collection Time: 12/29/17  2:10 PM  Result Value Ref Range Status   Specimen Description    Final    URINE, RANDOM Performed at [facility], [place] [address], [place], [place] [address]    Special Requests   Final    NONE Performed at [facility], [place] [address] [place], [place] [address]    Culture   Final    NO GROWTH Performed at [facility], [address] [place], [place] [address]    Report Status 12/30/2017 FINAL  Final  MRSA PCR Screening     Status: None   Collection Time: 12/29/17  6:50 PM  Result Value Ref Range Status   MRSA by PCR NEGATIVE NEGATIVE Final    Comment:        The GeneXpert MRSA Assay (FDA approved for NASAL specimens only), is one component of a comprehensive MRSA colonization surveillance program. It is not intended to diagnose MRSA infection nor to guide or monitor treatment for MRSA infections. Performed at [facility], [place] [address], [place], [place] [address]     FURTHER DISCHARGE INSTRUCTIONS:  Get Medicines reviewed and adjusted: Please take all your medications with you for your next visit with your Primary MD  Laboratory/radiological data: Please request your Primary MD to go over all hospital tests and procedure/radiological results at the follow up, please ask your Primary MD to get all Hospital records sent to his/her office.  In some cases, they will be blood work, cultures and biopsy results pending at the time of your discharge. Please request that your primary care M.D. goes through all the records of your hospital data and follows up on these results.  Also Note the following: If you experience worsening of your admission symptoms, develop shortness of breath, life threatening emergency, suicidal or homicidal thoughts you must seek medical attention immediately by calling 911 or calling your MD immediately  if symptoms less severe.  You must read complete instructions/literature along with all the possible adverse reactions/side effects for  all the Medicines you take and that have been prescribed to you. Take any new Medicines after you have completely understood and accpet all the possible adverse reactions/side effects.   Do not drive when taking Pain medications or sleeping medications (Benzodaizepines)  Do not take more than prescribed Pain, Sleep and Anxiety Medications. It is not advisable to combine anxiety,sleep and pain medications without talking with your primary care practitioner  Special Instructions: If you have smoked or chewed Tobacco  in the last 2 yrs please stop smoking, stop any regular Alcohol  and or any Recreational drug use.  Wear Seat belts while driving.  Please note: You were cared for by a hospitalist during your hospital stay. Once you are discharged, your primary care physician will handle any further medical issues. Please note that NO REFILLS for any discharge medications will be authorized once you are discharged, as it is imperative that you return to your primary care physician (or establish a relationship with a primary care physician if you do not have one) for your post hospital discharge needs so that they can reassess your need for medications and monitor your lab values.  Total Time spent coordinating discharge including counseling, education and face to face time equals  45 minutes.  SignedOren Binet 01/02/2018 10:15 AM

## 2018-01-02 NOTE — Progress Notes (Signed)
Referring Physician(s): Byerly,F  Supervising Physician: Aletta Edouard  Patient Status:  [facility] - In-pt  Chief Complaint: Recent fever, low blood pressure, chills     Subjective: Patient doing well today.  Denies significant abdominal pain, nausea, vomiting or leaking from biliary drain.  Anticipate discharge home today.  Only concern is constipation.   Allergies: Ace inhibitors; Scopolamine; and Sulfa antibiotics  Medications: Prior to Admission medications   Medication Sig Start Date End Date Taking? Authorizing Provider  acetaminophen (TYLENOL) 500 MG tablet Take 1,000 mg by mouth every 6 (six) hours as needed for pain.    Yes [provider]  albuterol (PROVENTIL HFA;VENTOLIN HFA) 108 (90 Base) MCG/ACT inhaler Inhale 2 puffs into the lungs every 6 (six) hours as needed for wheezing or shortness of breath. 01/27/17  Yes Mikhail, Velta Addison, DO  amitriptyline (ELAVIL) 25 MG tablet Take 50 mg by mouth at bedtime.   Yes [provider]  diphenhydrAMINE (BENADRYL) 25 MG tablet Take 25 mg by mouth at bedtime as needed for sleep.    Yes [provider]  ENSURE PLUS (ENSURE PLUS) LIQD Take 237 mLs by mouth 3 (three) times daily as needed (nutrition).   Yes [provider]  flecainide (TAMBOCOR) 100 MG tablet TAKE 1 TABLET BY MOUTH TWICE A DAY Patient taking differently: Take 100 mg by mouth every twelve hours 02/24/17  Yes Eileen Stanford, PA-C  fluticasone (FLONASE) 50 MCG/ACT nasal spray Place 1 spray into both nostrils 2 (two) times daily as needed for allergies.    Yes [provider]  guaiFENesin (MUCINEX) 600 MG 12 hr tablet Take 600 mg by mouth every morning.    Yes [provider]  HYDROcodone-acetaminophen (NORCO/VICODIN) 5-325 MG tablet Take 1-2 tablets by mouth every 4 (four) hours as needed for moderate pain. 11/19/17  Yes Ladell Pier, MD  lidocaine-prilocaine (EMLA) cream Apply small amount over port area 1-2 hours  prior to treatment and cover with plastic wrap.  DO NOT RUB IN. 07/09/16  Yes Ladell Pier, MD  LORazepam (ATIVAN) 1 MG tablet Take 0.5 tablets (0.5 mg total) by mouth every 8 (eight) hours as needed for anxiety. Patient taking differently: Take 1 mg by mouth every 8 (eight) hours as needed for anxiety.  05/10/15  Yes Owens Shark, NP  metoprolol succinate (TOPROL-XL) 25 MG 24 hr tablet Take 12.5 mg by mouth daily.   Yes [provider]  metroNIDAZOLE (FLAGYL) 500 MG tablet Take 500 mg by mouth 3 (three) times daily.   Yes [provider]  morphine (MSIR) 15 MG tablet Take 1 tablet (15 mg total) by mouth every 4 (four) hours as needed for severe pain. 12/24/17  Yes Ladell Pier, MD  promethazine (PHENERGAN) 12.5 MG tablet Take 1 tablet (12.5 mg total) by mouth every 6 (six) hours as needed for nausea or vomiting. 06/03/17  Yes Ladell Pier, MD  sodium chloride flush 0.9 % SOLN injection USE EVERY DAY 12/18/17  Yes [provider]  XARELTO 20 MG TABS tablet TAKE 1 TABLET BY MOUTH EVERY DAY Patient taking differently: Take 20 mg by mouth once a day 12/19/17  Yes Jerline Pain, MD  zolpidem (AMBIEN CR) 6.25 MG CR tablet Take 1 tablet (6.25 mg total) by mouth at bedtime. 02/26/17  Yes Ladell Pier, MD  amoxicillin-clavulanate (AUGMENTIN) 875-125 MG tablet TAKE 1 TABLET BY MOUTH TWICE A DAY FOR 3 WEEKS 12/24/17   [provider]  Vital Signs: BP 113/76 (BP Location: Left Arm)   Pulse 70   Temp 98 F (36.7 C) (Oral)   Resp 18   Ht 5\' 2"  (1.575 m)   Wt 149 lb 6.4 oz (67.8 kg)   LMP  (LMP Unknown)   SpO2 99%   BMI 27.33 kg/m   Physical Exam awake, alert.  abd drain intact, dressing clean and dry, output 895 cc green fluid  Imaging: Dg Chest Portable 1 View  Result Date: 12/29/2017 CLINICAL DATA:  States possible infection related to obstructed biliary duct-drain has only put out 200 cc in 2 days-fever. EXAM: PORTABLE CHEST 1 VIEW COMPARISON:   12/19/2017 FINDINGS: There is no focal parenchymal opacity. There is no pleural effusion or pneumothorax. The heart and mediastinal contours are unremarkable. There is a right-sided Port-A-Cath with the tip projecting over the SVC. There is generalized osteopenia. There is no aggressive osseous lesion. There is a left shoulder arthroplasty. IMPRESSION: No active disease. Electronically Signed   By: Kathreen Devoid   On: 12/29/2017 15:37   Dg Abd 2 Views  Result Date: 12/29/2017 CLINICAL DATA:  History of endometrial cancer. No bowel movement for 3 days. Pain. EXAM: ABDOMEN - 2 VIEW COMPARISON:  CT AP 12/19/2017. FINDINGS: Percutaneous biliary drain is again noted with pigtail overlying the right upper quadrant of the abdomen. There is no abnormal bowel dilatation. A moderate stool burden is identified within the colon and rectum. IMPRESSION: 1. Nonobstructive bowel gas pattern. Electronically Signed   By: Kerby Moors M.D.   On: 12/29/2017 19:32   Ir Exchange Biliary Drain  Result Date: 12/30/2017 INDICATION: 72 year old female with a history of pancreatic cancer status post Whipple resection and choledocho jejunostomy. She has and existing 10 [place] percutaneous biliary drain which passes through the patent choledocho jejunostomy and into the jejunum. However, there is concern for obstruction of the draining jejunal loop. 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 EXAM: Biliary tube exchange and cholangiogram MEDICATIONS: None. Patient is currently an inpatient and receiving intravenous antibiotics ANESTHESIA/SEDATION: None FLUOROSCOPY TIME:  Fluoroscopy Time: 7 minutes 18 seconds (106 mGy). COMPLICATIONS: None immediate. PROCEDURE: Informed written consent was obtained from the patient after a thorough discussion of the procedural risks, benefits and alternatives. All questions were addressed. Maximal Sterile Barrier Technique was utilized including caps, mask,  sterile gowns, sterile gloves, sterile drape, hand hygiene and skin antiseptic. A timeout was performed prior to the initiation of the procedure. An initial cholangiogram was attempted through the existing tube. However, the tube is pulled back and the injected contrast came out the sideholes just deep to the skin surface. Therefore, the retention suture was cut in the tube was transected. A Bentson wire was advanced through the tube and into the jejunum. The tube was removed. A Kumpe the catheter was advanced over the wire and into the jejunum. Contrast injection was performed. The Kumpe the catheter was initially in the blind limb of the jejunal loop. The catheter was brought back and an angled roadrunner wire was used to navigate the catheter into the draining limb of the jejunal loop. Contrast injection demonstrates a dilated proximal draining limb of the jejunum. No contrast material passes into the more distal jejunum. There is a suspected high-grade stenosis/stricture. With significant manipulation, ultimately the catheter and wire were successfully navigated beyond the obstruction and into the transverse portion of the jejunal loop. Contrast injection through the catheter confirms that this represents the jejunum distal to the  obstruction. There is good peristalsis and forward flow of contrast. The roadrunner wire was advanced into the jejunum. A new 12 French cook internal/external biliary drainage catheter was modified with several additional sideholes. The catheter was then advanced over the wire and formed with the locking loop in the jejunum distal to the jejunal obstruction. The most proximal side hole is located within the left hepatic ducts. The catheter was secured to the skin with an adhesive fixation device. IMPRESSION: 1. 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 2. Successful placement of a modified  internal/external biliary drainage catheter with the distal loop in the jejunum beyond the obstruction. The most proximal side holes are within the left intrahepatic biliary ducts. This tube should allow drainage of the biliary system and the obstructed segment of the jejunum. PLAN: 1. Maintain to gravity bag drainage for at least 24 hours before attempting capping. 2. Tentative plan to return Interventional Radiology in 8 weeks for biliary tube check and exchange. If the patient develops problems with the tube prior to that, she should call to be seen sooner. Signed, Criselda Peaches, MD [facility] Electronically Signed   By: Jacqulynn Cadet M.D.   On: 12/30/2017 17:32    Labs:  CBC: Recent Labs    12/20/17 0412  12/29/17 1130 12/30/17 0610 12/31/17 0500 01/01/18 0345  WBC 6.3   < > 11.2* 8.5 4.6 4.0  HGB 11.5*  --   --  8.9* 8.3* 8.3*  HCT 33.8*   < > 31.0* 25.4* 23.9* 23.5*  PLT 248   < > 234 165 150 164   < > = values in this interval not displayed.    COAGS: Recent Labs    12/02/17 1928 12/04/17 0353 12/29/17 2010 12/30/17 0610  INR 1.70 1.47 2.21 2.40  APTT 41*  --  42*  --     BMP: Recent Labs    12/30/17 0610 12/31/17 0500 01/01/18 0345 01/02/18 0411  NA 132* 132* 135 135  K 3.4* 3.7 3.8 3.8  CL 104 103 102 100*  CO2 20* 23 25 27   GLUCOSE 158* 80 83 89  BUN 9 6 5* 5*  CALCIUM 8.1* 7.8* 8.1* 8.1*  CREATININE 0.51 0.49 0.41* 0.46  GFRNONAA >60 >60 >60 >60  GFRAA >60 >60 >60 >60    LIVER FUNCTION TESTS: Recent Labs    12/24/17 0919 12/29/17 1130 12/30/17 0610 01/02/18 0411  BILITOT 0.9 1.9* 1.9* 0.9  AST 38* 40* 35 39  ALT 29 24 21 22   ALKPHOS 337* 441* 275* 420*  PROT 7.2 6.7 5.6* 5.2*  ALBUMIN 3.4* 3.3* 2.7* 2.4*    Assessment and Plan: Patient with history of pancreatic cancer and prior Whipple resection and choledochojejunostomy;status post internal/external biliary drain on  12/05/17. 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 Status post placement of a modified internal/external biliary drain with the distal loop in the jejunum beyond the obstruction. Currently afebrile. last WBC normal, hemoglobin 8.3, creatinine normal, K 3.8, urine cultures negative, prior blood cultures growing Klebsiella and Enterobacter-on IV antibiotic therapy.  Failed drain capping 4/10.  Continue drain to gravity bag and flush 1-2 times daily with 5-10 cc sterile normal saline.  As outpatient recommend flushing of drain 1-2 times per day with 5-10 cc sterile normal saline, output recording and dressing changes every 1-2 days.  Patient should  call interventional radiology at [phone] and speak with interventional radiologist on call with any drain related questions.  Also with external biliary drain in place long-term patient needs to have electrolytes monitored regularly.  She will be scheduled for biliary tube check and exchange in 8 weeks.  If patient becomes dehydrated or electrolytes are depleted may have to consider placement of a jejunal stent.  Patient given extra drain leg bag in case problems arise as outpatient.     Electronically Signed: D. Rowe Robert, PA-C 01/02/2018, 10:44 AM   I spent a total of 20 minutes at the the patient's bedside AND on the patient's hospital floor or unit, greater than 50% of which was counseling/coordinating care for biliary drain    Patient ID: [name], female   DOB: 1946/04/18, 72 y.o.   MRN: [id number]

## 2018-01-02 NOTE — Care Management Note (Signed)
Case Management Note  Patient Details  Name: GWENDLYN HANBACK MRN: [id number] Date of Birth: 12-31-1945  Subjective/Objective:  HHRN ordered for drain care-patient chose [facility] rep Santiago Glad aware of d/c & HHRN order. No further CM needs.                  Action/Plan:d/c home w/HHC.   Expected Discharge Date:  01/02/18               Expected Discharge Plan:  [place]  In-House Referral:     Discharge planning Services     Post Acute Care Choice:    Choice offered to:  Patient  DME Arranged:    DME Agency:     HH Arranged:  RN [place] Agency:  [place]  Status of Service:  Completed, signed off  If discussed at [place] of Stay Meetings, dates discussed:    Additional Comments:  Dessa Phi, RN 01/02/2018, 10:25 AM

## 2018-01-05 ENCOUNTER — Ambulatory Visit: Payer: Medicare Other | Admitting: Physician Assistant

## 2018-01-08 ENCOUNTER — Other Ambulatory Visit: Payer: Self-pay | Admitting: Nurse Practitioner

## 2018-01-08 ENCOUNTER — Telehealth: Payer: Self-pay | Admitting: Oncology

## 2018-01-08 DIAGNOSIS — C250 Malignant neoplasm of head of pancreas: Secondary | ICD-10-CM

## 2018-01-08 NOTE — Telephone Encounter (Signed)
Scheduled appt per 4/18 sch msg - left voicemail for patient regarding appts.

## 2018-01-09 ENCOUNTER — Inpatient Hospital Stay: Payer: Medicare Other

## 2018-01-09 ENCOUNTER — Other Ambulatory Visit (HOSPITAL_COMMUNITY): Payer: Self-pay | Admitting: General Surgery

## 2018-01-09 DIAGNOSIS — Z8507 Personal history of malignant neoplasm of pancreas: Secondary | ICD-10-CM | POA: Diagnosis not present

## 2018-01-09 DIAGNOSIS — Z8542 Personal history of malignant neoplasm of other parts of uterus: Secondary | ICD-10-CM | POA: Diagnosis not present

## 2018-01-09 DIAGNOSIS — C259 Malignant neoplasm of pancreas, unspecified: Secondary | ICD-10-CM

## 2018-01-09 DIAGNOSIS — I959 Hypotension, unspecified: Secondary | ICD-10-CM | POA: Diagnosis not present

## 2018-01-09 DIAGNOSIS — R509 Fever, unspecified: Secondary | ICD-10-CM | POA: Diagnosis present

## 2018-01-09 DIAGNOSIS — C250 Malignant neoplasm of head of pancreas: Secondary | ICD-10-CM

## 2018-01-09 DIAGNOSIS — Z452 Encounter for adjustment and management of vascular access device: Secondary | ICD-10-CM | POA: Diagnosis not present

## 2018-01-10 LAB — CANCER ANTIGEN 19-9: CAN 19-9: 392 U/mL — AB (ref 0–35)

## 2018-01-12 ENCOUNTER — Telehealth: Payer: Self-pay

## 2018-01-12 ENCOUNTER — Encounter: Payer: Self-pay | Admitting: Oncology

## 2018-01-12 NOTE — Telephone Encounter (Signed)
Response to MyChart message: Per Dr. [name], CA 19-9 is mildly elevated. Pt voiced understanding and would like results faxed to Dr. Marlowe Aschoff office. Will fax results

## 2018-01-16 ENCOUNTER — Encounter (HOSPITAL_COMMUNITY)
Admission: RE | Admit: 2018-01-16 | Discharge: 2018-01-16 | Disposition: A | Discharge status: Home / Self Care | Payer: Medicare Other | Source: Ambulatory Visit | Attending: General Surgery | Admitting: General Surgery

## 2018-01-16 DIAGNOSIS — C259 Malignant neoplasm of pancreas, unspecified: Secondary | ICD-10-CM | POA: Diagnosis not present

## 2018-01-16 LAB — GLUCOSE, CAPILLARY: Glucose-Capillary: 106 mg/dL — ABNORMAL HIGH (ref 65–99)

## 2018-01-16 MED ORDER — FLUDEOXYGLUCOSE F - 18 (FDG) INJECTION
6.9100 | Freq: Once | INTRAVENOUS | Status: AC | PRN
  Administered 2018-01-16: 6.91 via INTRAVENOUS

## 2018-01-19 ENCOUNTER — Encounter: Payer: Self-pay | Admitting: Oncology

## 2018-01-21 ENCOUNTER — Inpatient Hospital Stay: Payer: Medicare Other | Attending: Oncology | Admitting: Nurse Practitioner

## 2018-01-21 ENCOUNTER — Encounter: Payer: Self-pay | Admitting: Nurse Practitioner

## 2018-01-21 ENCOUNTER — Telehealth: Payer: Self-pay

## 2018-01-21 ENCOUNTER — Inpatient Hospital Stay: Payer: Medicare Other

## 2018-01-21 VITALS — BP 107/77 | HR 82 | Temp 98.0°F | Resp 17 | Ht 62.0 in | Wt 135.2 lb

## 2018-01-21 DIAGNOSIS — C250 Malignant neoplasm of head of pancreas: Secondary | ICD-10-CM

## 2018-01-21 DIAGNOSIS — R197 Diarrhea, unspecified: Secondary | ICD-10-CM | POA: Diagnosis not present

## 2018-01-21 DIAGNOSIS — Z8507 Personal history of malignant neoplasm of pancreas: Secondary | ICD-10-CM | POA: Insufficient documentation

## 2018-01-21 DIAGNOSIS — C541 Malignant neoplasm of endometrium: Secondary | ICD-10-CM

## 2018-01-21 DIAGNOSIS — Z95828 Presence of other vascular implants and grafts: Secondary | ICD-10-CM

## 2018-01-21 DIAGNOSIS — D701 Agranulocytosis secondary to cancer chemotherapy: Secondary | ICD-10-CM | POA: Insufficient documentation

## 2018-01-21 DIAGNOSIS — Z7901 Long term (current) use of anticoagulants: Secondary | ICD-10-CM | POA: Insufficient documentation

## 2018-01-21 DIAGNOSIS — Z452 Encounter for adjustment and management of vascular access device: Secondary | ICD-10-CM | POA: Insufficient documentation

## 2018-01-21 DIAGNOSIS — I4891 Unspecified atrial fibrillation: Secondary | ICD-10-CM | POA: Insufficient documentation

## 2018-01-21 DIAGNOSIS — Z8542 Personal history of malignant neoplasm of other parts of uterus: Secondary | ICD-10-CM | POA: Insufficient documentation

## 2018-01-21 MED ORDER — SODIUM CHLORIDE 0.9 % IJ SOLN
10.0000 mL | INTRAMUSCULAR | Status: DC | PRN
  Administered 2018-01-21: 10 mL via INTRAVENOUS
  Filled 2018-01-21: qty 10

## 2018-01-21 MED ORDER — HEPARIN SOD (PORK) LOCK FLUSH 100 UNIT/ML IV SOLN
500.0000 [IU] | Freq: Once | INTRAVENOUS | Status: AC | PRN
  Administered 2018-01-21: 500 [IU] via INTRAVENOUS
  Filled 2018-01-21: qty 5

## 2018-01-21 NOTE — Progress Notes (Addendum)
[place] OFFICE PROGRESS NOTE   Diagnosis: Pancreas cancer  INTERVAL HISTORY:   Ms. Kenealy returns as scheduled.  She was hospitalized 12/29/2017 through 01/02/2018 with sepsis secondary to Klebsiella bacteremia/cholangitis.  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  A modified internal/external biliary drainage catheter was placed beyond the obstruction.  No fever or chills.  She feels a full/uncomfortable sensation at the left abdomen.  She is having intermittent pain at the right lower abdomen extending to the back.  Bowels are moving.  Intermittent nausea.  Oral intake varies.  She notes early satiety.  Objective:  Vital signs in last 24 hours:  Blood pressure 107/77, pulse 82, temperature 98 F (36.7 C), temperature source Oral, resp. rate 17, height 5\' 2"  (1.575 m), weight 135 lb 3.2 oz (61.3 kg), SpO2 100 %.    HEENT: No thrush or ulcers.   Resp: Lungs clear bilaterally. Cardio: Regular rate and rhythm. GI: Abdomen is soft and nontender.  No hepatomegaly.  Left abdomen biliary drainage catheter. Vascular: No leg edema. Neuro: Alert and oriented. Port-A-Cath without erythema.  Lab Results:  Lab Results  Component Value Date   WBC 4.0 01/01/2018   HGB 8.3 (L) 01/01/2018   HCT 23.5 (L) 01/01/2018   MCV 87.0 01/01/2018   PLT 164 01/01/2018   NEUTROABS 10.0 (H) 12/29/2017    Imaging:  No results found.  Medications: I have reviewed the patient's current medications.  Assessment/Plan: 1. 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  Gemcitabine held 11/02/2014  due to neutropenia.  Gemcitabine 11/09/2014 dose reduced 800 mg/m.  Gemcitabine held 11/16/2014 due to neutropenia.  Gemcitabine resumed 11/23/2014 every 2 week schedule.  Cycle 6 gemcitabine 01/05/2015  Cycle 7 gemcitabine 01/18/2015  Cycle 8 gemcitabine 02/01/2015  Cycle 9 gemcitabine 02/15/2015  Cycle 10 gemcitabine 03/01/2015  Cycle 11 gemcitabine 03/15/2015  Cycle 12 gemcitabine 03/29/2015  Elevated CA 19-01 May 2016  CTs 06/17/2016-new soft tissue mass at the root of the mesentery with vascular involvement  PET 06/27/2016-hypermetabolic activity associated with soft tissue adjacent to surgical clips in the central mesentery  Status post SBRTto the mesenteric mass completed 07/26/2016  CT abdomen/pelvis 08/23/2016 -mesenteric mass stable to slightly decreased in size.  Cycle 1 FOLFOX 09/03/2016  Cycle 2 FOLFOX 09/30/2016  Cycle 3 FOLFOX 10/14/2016   Cycle 4 FOLFOX 11/05/2016 (5-FU bolus eliminated and 5-FU pump dose reduced)  Cycle 5 FOLFOX 11/19/2016  CT abdomen/pelvis 11/28/2016-decreased size of soft tissue at the small bowel mesentery, mild asymmetric soft tissue at the left vaginal cuff  Cycle 6 FOLFOX 12/10/2016 (5-FU infusion further reduced and oxaliplatin reduced)  Cycle 7 FOLFOX 12/31/2016  Cycle 8 FOLFOX 01/21/2017   CT 02/24/2017-slight decrease in size of the mesenteric mass, resolution of soft tissue fullness at the left vaginal cuff, no evidence of disease progression  CT abdomen/pelvis 06/20/2017-stable ill-defined soft tissue at the mesenteric root, mild increased asymmetry at the left vaginal cuff, no other evidence of disease progression  Elevated CA 19-9 09/11/2018  CT abdomen/pelvis 11/18/2017-no evidence of recurrent pancreas cancer, dilated afferentloop, intrahepatic biliary dilatation  PET scan 01/16/2018-low level hypermetabolic activity in the porta hepatis which appears to correspond with a mildly enlarged lymph node on  MRI.  No other suspicious nodal activity in the abdomen.  Small right paratracheal and subcarinal lymph nodes with low-level hypermetabolic activity in the chest.  No abnormal activity within the liver.  Findings suspicious for obstruction at the right ureteropelvic junction.  Asymmetric left oropharyngeal activity.   2. bile duct obstruction secondary to #1, status post an ERCP with stent placement 09/23/2015hypermetabolic soft tissue in the central mesentery, no other evidence of metastatic disease, stable mildly enlarged portal caval node  3. Admission with post ERCP pancreatitis 06/16/2014  4. History of abdominal pain secondary to #1  5. Pulmonary embolism diagnosed on a CT of the abdomen 09/16/2014  Negative lower extremity Dopplers 09/17/2014  6. Multiple orthopedic surgical procedures  7. Endometrial cancer,stage IA, grade 1 endometrioid adenocarcinoma, 18% myometrial invasion, no lymphovascular space involvement, negative washings  Status post robotic total hysterectomy and bilateral salpingo-oophorectomy 11/30/2010  Recurrent tumor left lateral vagina status post biopsy 11/24/2014 with pathology confirming adenocarcinoma with focal squamous differentiation consistent with endometrial adenocarcinoma  Staging CT scans 12/06/2014 with no evidence of local pancreatic cancer recurrence. Small fluid collection adjacent to the left adrenal gland. Severe hepatic steatosis. No evidence of local extension of endometrial carcinoma. Carcinoma not well-defined at the vaginal cuff. 5 mm right external iliac lymph node. 3.6 mm left external iliac lymph node  Brachytherapy initiated 12/22/2014, completed 01/19/2015  CT abdomen/pelvis 07/24/2015 revealed a 3 x 4 cm soft tissue focus at the vaginal apex  PET scan 08/11/2015 revealed no mass at the vaginal apex and no evidence of metastatic disease  CT 11/28/2016-mild asymmetric soft tissue at the left vaginal cuff, resolved on  CT 02/24/2017  CT abdomen/pelvis 06/20/2017-stable mild ill-defined soft tissue density in the mesenteric root. Stable mild portacaval lymphadenopathy and subcentimeter right retroperitoneal lymph nodes. Mild increased size of asymmetric soft tissue density involving the left vaginal cuff.  PET scan 07/19/2017-no suspicious hypermetabolic activity within the neck, chest, abdomen or pelvis. Specifically no evidence of residual hypermetabolic tumor in the surgical bed or at the vaginal cuff. Hypermetabolic activity in the lumbar spine at the level of the right L3-4 facet joint appears degenerative.  MRI abdomen 12/02/2017-focal area of abnormal signal in the caudate lobe of the liver-unclear etiology, dilation of the hepaticojejunostomy loop, bile ducts, and pancreatic duct-stricture formation versus recurrent tumor  8. History of atrial fibrillation-maintained on xarelto  9. Family history of multiple cancers-negative CancerNext gene panel  10. Prolonged nausea following the pancreaticoduodenectomy. Improved 10/26/2014.  11. Port-A-Cath placement 10/21/2014.  12. History of Neutropenia secondary to chemotherapy   13. Diarrhea. Question pancreatic insufficiency. Pancreatic enzyme replacement initiated 01/05/2015. Recurrent diarrhea following a course of antibiotics March 2017.  14. History of positional vertigo-resolved  15. 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 Neutropenia secondary to chemotherapy 09/18/2016.  17. Delayed nausea and diarrhea following FOLFOX. Emend added with cycle 2. Decadron prophylaxis added with cycle 3  18. Diarrhea 10/28/2016. Question related to chemotherapy. Negative C. difficile testing 10/31/2016.  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  20.Admission 12/02/2017 with Bacteroides  bacteremia  Biliary obstruction documented on CT/MRI 12/02/2017  Upper endoscopy 12/05/2017 revealed angulation of the apparent limb precluding intubation  Status post placement of a biliary drain 12/05/2017, replaced 12/09/2017  Bile duct brushings 12/09/2017-negative for malignancy  Biliary drain cholangiogram 12/18/2017- there is patency of the biliary-enteric anastomosis with no contrast traverses the afferentlimb into the jejunum  21.  Admission 12/29/2017 through 01/02/2018 with sepsis secondary to Klebsiella bacteremia/cholangitis.  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  Internal/external biliary drainage catheter placed.    Disposition: Ms. Christoph appears stable.  Dr. [name] reviewed the PET scan report/images with Ms. Bazar and her family at today's visit.  They understand the findings likely indicate recurrence of the pancreas cancer and that cancer is the likely cause of the right hydronephrosis.  Preliminary discussion regarding systemic therapy options initiated at today's visit.  Dr. [name] recommends she follow-up with Dr. Laurence Ferrari regarding the biliary drainage catheter and with urology regarding the right hydronephrosis before making any decisions on systemic therapy.  She and her family are in agreement with this plan.  She will return for a follow-up visit in 3 weeks for additional discussion.  She will contact the office in the interim with any problems.  Patient seen with Dr. [name].  25 minutes were spent face-to-face at today's visit with the majority of that time involved in counseling/coordination of care.    Ned Card ANP/GNP-BC   01/21/2018  10:15 AM This was a shared visit with Ned Card.  Ms. Clagg was interviewed and examined.  We reviewed the PET images with her.  The elevated CA 19-9 in conjunction with the clinical and radiologic findings are consistent with progression of metastatic  pancreas cancer.  She will undergo placement of a small bowel stent and right ureter stent prior to returning for an office visit in 3 weeks.  We discussed systemic therapy options with her today.  We will discuss details of the gemcitabine/Abraxane regimen when she returns in 3 weeks.  Julieanne Manson, MD

## 2018-01-21 NOTE — Telephone Encounter (Signed)
Printed avs and calender of upcoming appointment. Per 5/1 los 

## 2018-01-22 LAB — CANCER ANTIGEN 19-9: CAN 19-9: 369 U/mL — AB (ref 0–35)

## 2018-01-23 ENCOUNTER — Telehealth: Payer: Self-pay | Admitting: *Deleted

## 2018-01-23 NOTE — Telephone Encounter (Signed)
Call from pt's daughter reporting they saw urology and they did not recommend a stent. They were told the mass was external. Per daughter, pt is asking if she will have biopsy of the renal mass. Pt is scheduled for biliary drain exchange on 5/8, they wonder if they can also schedule biopsy that day.  Instructed her to call Dr. Barry Dienes to see what she recommends. Dr. [name] will be back in the office on 5/6, will review with him then.

## 2018-01-24 NOTE — Telephone Encounter (Signed)
No option to biopsy extrarenal mass

## 2018-01-27 ENCOUNTER — Other Ambulatory Visit: Payer: Self-pay | Admitting: Student

## 2018-01-28 ENCOUNTER — Other Ambulatory Visit (HOSPITAL_COMMUNITY): Payer: Self-pay | Admitting: General Surgery

## 2018-01-28 ENCOUNTER — Other Ambulatory Visit (HOSPITAL_COMMUNITY): Payer: Self-pay | Admitting: Interventional Radiology

## 2018-01-28 ENCOUNTER — Encounter (HOSPITAL_COMMUNITY): Payer: Self-pay

## 2018-01-28 ENCOUNTER — Ambulatory Visit (HOSPITAL_COMMUNITY)
Admission: RE | Admit: 2018-01-28 | Discharge: 2018-01-28 | Disposition: A | Discharge status: Home / Self Care | Payer: Medicare Other | Source: Ambulatory Visit | Attending: Interventional Radiology | Admitting: Interventional Radiology

## 2018-01-28 ENCOUNTER — Ambulatory Visit (HOSPITAL_COMMUNITY)
Admission: RE | Admit: 2018-01-28 | Discharge: 2018-01-28 | Disposition: A | Discharge status: Home / Self Care | Payer: Medicare Other | Source: Ambulatory Visit | Attending: General Surgery | Admitting: General Surgery

## 2018-01-28 DIAGNOSIS — K219 Gastro-esophageal reflux disease without esophagitis: Secondary | ICD-10-CM | POA: Diagnosis not present

## 2018-01-28 DIAGNOSIS — Z7901 Long term (current) use of anticoagulants: Secondary | ICD-10-CM | POA: Diagnosis not present

## 2018-01-28 DIAGNOSIS — Z981 Arthrodesis status: Secondary | ICD-10-CM | POA: Diagnosis not present

## 2018-01-28 DIAGNOSIS — Z888 Allergy status to other drugs, medicaments and biological substances status: Secondary | ICD-10-CM | POA: Insufficient documentation

## 2018-01-28 DIAGNOSIS — Z8507 Personal history of malignant neoplasm of pancreas: Secondary | ICD-10-CM | POA: Insufficient documentation

## 2018-01-28 DIAGNOSIS — G629 Polyneuropathy, unspecified: Secondary | ICD-10-CM | POA: Diagnosis not present

## 2018-01-28 DIAGNOSIS — K831 Obstruction of bile duct: Secondary | ICD-10-CM | POA: Diagnosis not present

## 2018-01-28 DIAGNOSIS — E669 Obesity, unspecified: Secondary | ICD-10-CM | POA: Insufficient documentation

## 2018-01-28 DIAGNOSIS — Z90411 Acquired partial absence of pancreas: Secondary | ICD-10-CM | POA: Insufficient documentation

## 2018-01-28 DIAGNOSIS — R896 Abnormal cytological findings in specimens from other organs, systems and tissues: Secondary | ICD-10-CM | POA: Diagnosis not present

## 2018-01-28 DIAGNOSIS — K8309 Other cholangitis: Secondary | ICD-10-CM | POA: Insufficient documentation

## 2018-01-28 DIAGNOSIS — Z923 Personal history of irradiation: Secondary | ICD-10-CM | POA: Insufficient documentation

## 2018-01-28 DIAGNOSIS — Z96619 Presence of unspecified artificial shoulder joint: Secondary | ICD-10-CM | POA: Insufficient documentation

## 2018-01-28 DIAGNOSIS — M5030 Other cervical disc degeneration, unspecified cervical region: Secondary | ICD-10-CM | POA: Insufficient documentation

## 2018-01-28 DIAGNOSIS — Z9889 Other specified postprocedural states: Secondary | ICD-10-CM | POA: Insufficient documentation

## 2018-01-28 DIAGNOSIS — Z882 Allergy status to sulfonamides status: Secondary | ICD-10-CM | POA: Diagnosis not present

## 2018-01-28 DIAGNOSIS — C259 Malignant neoplasm of pancreas, unspecified: Secondary | ICD-10-CM

## 2018-01-28 DIAGNOSIS — Z9221 Personal history of antineoplastic chemotherapy: Secondary | ICD-10-CM | POA: Insufficient documentation

## 2018-01-28 DIAGNOSIS — I480 Paroxysmal atrial fibrillation: Secondary | ICD-10-CM | POA: Diagnosis not present

## 2018-01-28 DIAGNOSIS — Z86711 Personal history of pulmonary embolism: Secondary | ICD-10-CM | POA: Insufficient documentation

## 2018-01-28 DIAGNOSIS — Z8619 Personal history of other infectious and parasitic diseases: Secondary | ICD-10-CM | POA: Diagnosis not present

## 2018-01-28 DIAGNOSIS — Z8542 Personal history of malignant neoplasm of other parts of uterus: Secondary | ICD-10-CM | POA: Diagnosis not present

## 2018-01-28 DIAGNOSIS — I10 Essential (primary) hypertension: Secondary | ICD-10-CM | POA: Insufficient documentation

## 2018-01-28 DIAGNOSIS — Z9049 Acquired absence of other specified parts of digestive tract: Secondary | ICD-10-CM | POA: Diagnosis not present

## 2018-01-28 DIAGNOSIS — M170 Bilateral primary osteoarthritis of knee: Secondary | ICD-10-CM | POA: Diagnosis not present

## 2018-01-28 DIAGNOSIS — K56699 Other intestinal obstruction unspecified as to partial versus complete obstruction: Secondary | ICD-10-CM | POA: Insufficient documentation

## 2018-01-28 DIAGNOSIS — Z90710 Acquired absence of both cervix and uterus: Secondary | ICD-10-CM | POA: Insufficient documentation

## 2018-01-28 DIAGNOSIS — Z79899 Other long term (current) drug therapy: Secondary | ICD-10-CM | POA: Insufficient documentation

## 2018-01-28 DIAGNOSIS — Z96653 Presence of artificial knee joint, bilateral: Secondary | ICD-10-CM | POA: Insufficient documentation

## 2018-01-28 DIAGNOSIS — Z4803 Encounter for change or removal of drains: Secondary | ICD-10-CM | POA: Diagnosis present

## 2018-01-28 HISTORY — PX: IR BALLOON DILATION OF BILIARY DUCTS/AMPULLA: IMG6052

## 2018-01-28 HISTORY — PX: IR EXCHANGE BILIARY DRAIN: IMG6046

## 2018-01-28 HISTORY — PX: IR ENDOLUMINAL BX OF BILIARY TREE: IMG6053

## 2018-01-28 HISTORY — PX: IR FLUORO PROCEDURE UNLISTED: IMG2388

## 2018-01-28 LAB — CBC
HCT: 31.1 % — ABNORMAL LOW (ref 36.0–46.0)
HEMOGLOBIN: 10.6 g/dL — AB (ref 12.0–15.0)
MCH: 29.4 pg (ref 26.0–34.0)
MCHC: 34.1 g/dL (ref 30.0–36.0)
MCV: 86.1 fL (ref 78.0–100.0)
PLATELETS: 283 10*3/uL (ref 150–400)
RBC: 3.61 MIL/uL — AB (ref 3.87–5.11)
RDW: 14.8 % (ref 11.5–15.5)
WBC: 5.9 10*3/uL (ref 4.0–10.5)

## 2018-01-28 LAB — COMPREHENSIVE METABOLIC PANEL
ALBUMIN: 3.5 g/dL (ref 3.5–5.0)
ALT: 14 U/L (ref 14–54)
ANION GAP: 9 (ref 5–15)
AST: 25 U/L (ref 15–41)
Alkaline Phosphatase: 104 U/L (ref 38–126)
BUN: 11 mg/dL (ref 6–20)
CALCIUM: 9.1 mg/dL (ref 8.9–10.3)
CO2: 22 mmol/L (ref 22–32)
Chloride: 103 mmol/L (ref 101–111)
Creatinine, Ser: 1.03 mg/dL — ABNORMAL HIGH (ref 0.44–1.00)
GFR calc non Af Amer: 54 mL/min — ABNORMAL LOW (ref >60)
Glucose, Bld: 101 mg/dL — ABNORMAL HIGH (ref 65–99)
Potassium: 4 mmol/L (ref 3.5–5.1)
SODIUM: 134 mmol/L — AB (ref 135–145)
TOTAL PROTEIN: 7.2 g/dL (ref 6.5–8.1)
Total Bilirubin: 0.8 mg/dL (ref 0.3–1.2)

## 2018-01-28 LAB — APTT: aPTT: 32 seconds (ref 24–36)

## 2018-01-28 LAB — PROTIME-INR
INR: 1.41
PROTHROMBIN TIME: 17.1 s — AB (ref 11.4–15.2)

## 2018-01-28 MED ORDER — MIDAZOLAM HCL 2 MG/2ML IJ SOLN
INTRAMUSCULAR | Status: AC | PRN
  Administered 2018-01-28 (×2): 1 mg via INTRAVENOUS

## 2018-01-28 MED ORDER — IOPAMIDOL (ISOVUE-300) INJECTION 61%
INTRAVENOUS | Status: AC
  Administered 2018-01-28: 50 mL
  Filled 2018-01-28: qty 50

## 2018-01-28 MED ORDER — HEPARIN SOD (PORK) LOCK FLUSH 100 UNIT/ML IV SOLN: 500.0000 [IU] | INTRAVENOUS | Status: DC | PRN

## 2018-01-28 MED ORDER — IOPAMIDOL (ISOVUE-300) INJECTION 61%
50.0000 mL | Freq: Once | INTRAVENOUS | Status: AC | PRN
  Administered 2018-01-28: 50 mL

## 2018-01-28 MED ORDER — MIDAZOLAM HCL 2 MG/2ML IJ SOLN
INTRAMUSCULAR | Status: AC
  Filled 2018-01-28: qty 6

## 2018-01-28 MED ORDER — FENTANYL CITRATE (PF) 100 MCG/2ML IJ SOLN
INTRAMUSCULAR | Status: AC | PRN
  Administered 2018-01-28 (×2): 50 ug via INTRAVENOUS

## 2018-01-28 MED ORDER — FENTANYL CITRATE (PF) 100 MCG/2ML IJ SOLN
INTRAMUSCULAR | Status: AC
  Filled 2018-01-28: qty 4

## 2018-01-28 MED ORDER — SODIUM CHLORIDE 0.9 % IV SOLN
1.0000 g | Freq: Once | INTRAVENOUS | Status: AC
  Administered 2018-01-28: 1 g via INTRAVENOUS
  Filled 2018-01-28: qty 1

## 2018-01-28 MED ORDER — LIDOCAINE HCL 1 % IJ SOLN
INTRAMUSCULAR | Status: AC
  Filled 2018-01-28: qty 20

## 2018-01-28 MED ORDER — HEPARIN SOD (PORK) LOCK FLUSH 100 UNIT/ML IV SOLN
500.0000 [IU] | INTRAVENOUS | Status: DC
  Administered 2018-01-28: 500 [IU]

## 2018-01-28 MED ORDER — HEPARIN SOD (PORK) LOCK FLUSH 100 UNIT/ML IV SOLN
INTRAVENOUS | Status: AC
  Filled 2018-01-28: qty 5

## 2018-01-28 MED ORDER — SODIUM CHLORIDE 0.9 % IV SOLN
INTRAVENOUS | Status: DC
  Administered 2018-01-28: 12:00:00 via INTRAVENOUS

## 2018-01-28 NOTE — Discharge Instructions (Signed)
Per Dr. Geroge Baseman, attach drainage bag if you begin to having drainage around your insertion site.   You may bath in 24 hours.    Biliary Drainage Catheter Home Guide A biliary drainage catheter is a thin, flexible tube that is inserted through your skin into the bile ducts in your liver. Bile is a thick yellow or green fluid that helps digest fat in foods. The purpose of a biliary drainage catheter is to keep bile from backing up into your liver. Backup of bile can occur when there is a blockage that prevents bile from moving from the bile ducts into the small intestine as it should. The blockage can be caused by gallstones, a tumor, or scar tissue. There are three types of biliary drainage:  External biliary drainage. With this type, bile is only drained into a collection bag outside your body (external collection bag).  Internal-external biliary drainage. With this type, bile is drained to an external collection bag as well as into your small intestine.  Internal biliary drainage. With this type, bile is only drained into your small intestine.  General home care includes these daily actions:  Inspection of your drainage catheter.  Flushing your drainage catheter with saline.  Emptying drainage from the collection bag (if present).  Recording the amount of drainage.  Checking the catheter insertion site for signs of infection. Check for: ? Redness, swelling, or pain. ? Fluid or blood. ? Warmth. ? Pus or a bad smell. How do I inspect my drainage catheter?  Check the dressing to make sure that it is dry and clean.  Look at the skin around the drainage catheter when changing the dressing for any problems such as redness, rash, or skin breakdown.  Check the drainage bag to make sure that drainage fluid is flowing into the bag well. Note the color and amount compared to other days.  Check the drainage catheter and bag for any cracks or kinks in the tubing. How do I change my  dressing? The dressing over the drainage catheter should be changed every other day, or more often if needed to keep the dressing dry. Your health care provider will instruct you about how often to change your dressing. Supplies needed:  Mild soap and warm water.  Split gauze pads, 4 x 4 inches (10 x 10 cm) to use as a dressing sponge.  Gauze pads, 4 x 4 inches (10 x 10 cm) or adhesive dressing cover.  Paper tape. How to change the dressing: 1. Wash your hands with soap and water. 2. Gently remove the old dressing. Avoid using scissors to remove the dressing because they may damage the drainage catheter. 3. Wash the skin around the insertion site with mild soap and warm water, rinse well, then pat the area dry with a clean cloth. 4. Check the skin around the drainage catheter for redness or swelling, or for yellow or green discharge that has a bad smell. 5. If the drainage catheter was stitched (sutured) to the skin, inspect the suture to make sure it is still anchored in the skin. 6. Do not apply creams, ointments, or alcohol to the site. Allow the skin to air-dry completely before you apply a new dressing. 7. Place the drainage catheter through the slit in a dressing sponge. The dressing sponge should slide under the disk that holds the drainage catheter in place. 8. Cover the drainage catheter and the dressing sponge with a 4 x 4 inch (10 x 10 cm) gauze. The  drainage catheter should rest on the gauze and not on the skin. 9. Tape the dressing to the skin. 10. You may be instructed to use an adhesive dressing covering over the top of this in place of the gauze and tape. 11. Wash your hands with soap and water. How do I flush my drainage catheter? Biliary drainagecatheters should be flushed daily, or as often as told by your health care provider. The end of the drainage catheter is closed using an IV cap. A syringe can be directly connected to the IV cap. Supplies needed:  Alcohol  swab.  10 mL prefilled normal saline syringe. How to flush the drainage catheter: 1. Wash your hands with soap and water. 2. If your drainage catheter has a stopcock attached to it, turn the stopcock toward the drainage bag. This will allow the saline to flow in the direction of your body. 3. Clean the IV cap with an alcohol swab. 4. Screw the tip of a 10 mL normal saline syringe onto the IV cap. 5. Inject the saline over 5-10 seconds. If you feel resistance while injecting, stop immediately. Avoid  pulling back on the plunger. Doing that could increase your risk of infection. 6. Remove the syringe from the cap. Turn the stopcock so that fluid flows from your body into the drainage bag. You may notice more fluid flowing into the bag after you have completed the flush. How do I attach a bag to my drainage catheter? If you are having trouble with your internal biliary drain, you may be directed by your health care provider to use bag drainage until you can be seen to fix the problem. For this reason, you should always have a collection bag and connecting tubing at home. If you do not have these supplies, remember to ask for them at your next appointment. 1. Remove the bag and the connecting tubing from their packaging. 2. Connect the funnel end of the tubing to the bag's cone-shaped stem. 3. Remove the IV cap from the biliary drain. To do this, unscrew it and replace it with the screw-on end of the tubing. 4. Save the IV cap in a plastic storage bag that can be sealed.  How do I empty my collection bag? Empty the collection bag whenever it becomes 2/3 full. Also empty it before you go to sleep. Most collection bags have a drainage valve at the bottom so the bag can be that allows them to be emptied easily. 1. Wash your hands with soap and water. 2. Hold the collection bag over the toilet, basin, or collection container. Use a measuring container if your health care provider told you to measure the  drainage. 3. Unscrew the valve to open it, and allow the bag to drain. 4. Close the valve securely to avoid leakage. 5. Use a tissue or disposable napkin to wipe the valve clean. 6. Wash the measuring container with soap and water. 7. Record the amount of drainage as told by your health care provider.  Contact a health care provider if:  Your pain gets worse after it had improved, and it is not relieved with pain medicines.  You have any questions about caring for your drainage catheter or collection bag.  You have any of these around your catheter insertion site or coming from it: ? Skin breakdown. ? Redness, swelling, or pain. ? Fluid or blood. ? Warmth to the touch. ? Pus or a bad smell. Get help right away if:  You have  a fever or chills.  Your redness, swelling, or pain at the catheter insertion site gets worse, even though you are cleaning it well.  You have leakage of bile around the drainage catheter.  Your drainage catheter becomes blocked or clogged.  Your drainage catheter comes out. This information is not intended to replace advice given to you by your health care provider. Make sure you discuss any questions you have with your health care provider. Document Released: 06/30/2013 Document Revised: 07/29/2016 Document Reviewed: 07/29/2016 Elsevier Interactive Patient Education  2017 [employer].   Moderate Conscious Sedation, Adult, Care After These instructions provide you with information about caring for yourself after your procedure. Your health care provider may also give you more specific instructions. Your treatment has been planned according to current medical practices, but problems sometimes occur. Call your health care provider if you have any problems or questions after your procedure. What can I expect after the procedure? After your procedure, it is common:  To feel sleepy for several hours.  To feel clumsy and have poor balance for several  hours.  To have poor judgment for several hours.  To vomit if you eat too soon.  Follow these instructions at home: For at least 24 hours after the procedure:   Do not: ? Participate in activities where you could fall or become injured. ? Drive. ? Use heavy machinery. ? Drink alcohol. ? Take sleeping pills or medicines that cause drowsiness. ? Make important decisions or sign legal documents. ? Take care of children on your own.  Rest. Eating and drinking  Follow the diet recommended by your health care provider.  If you vomit: ? Drink water, juice, or soup when you can drink without vomiting. ? Make sure you have little or no nausea before eating solid foods. General instructions  Have a responsible adult stay with you until you are awake and alert.  Take over-the-counter and prescription medicines only as told by your health care provider.  If you smoke, do not smoke without supervision.  Keep all follow-up visits as told by your health care provider. This is important. Contact a health care provider if:  You keep feeling nauseous or you keep vomiting.  You feel light-headed.  You develop a rash.  You have a fever. Get help right away if:  You have trouble breathing. This information is not intended to replace advice given to you by your health care provider. Make sure you discuss any questions you have with your health care provider. Document Released: 06/30/2013 Document Revised: 02/12/2016 Document Reviewed: 12/30/2015 Elsevier Interactive Patient Education  [employer].
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
## 2018-01-30 ENCOUNTER — Other Ambulatory Visit (HOSPITAL_COMMUNITY): Payer: Self-pay | Admitting: Interventional Radiology

## 2018-01-30 ENCOUNTER — Ambulatory Visit (HOSPITAL_COMMUNITY)
Admission: RE | Admit: 2018-01-30 | Discharge: 2018-01-30 | Disposition: A | Discharge status: Home / Self Care | Payer: Medicare Other | Source: Ambulatory Visit | Attending: Interventional Radiology | Admitting: Interventional Radiology

## 2018-01-30 ENCOUNTER — Ambulatory Visit: Payer: Medicare Other | Admitting: Cardiology

## 2018-01-30 ENCOUNTER — Encounter (HOSPITAL_COMMUNITY): Payer: Self-pay | Admitting: Interventional Radiology

## 2018-01-30 DIAGNOSIS — K315 Obstruction of duodenum: Secondary | ICD-10-CM

## 2018-01-30 HISTORY — PX: IR PATIENT EVAL TECH 0-60 MINS: IMG5564

## 2018-01-30 NOTE — Procedures (Signed)
Patient came in today with severe pain s/p biliary/ duodenal stent placement on Wednesday.  She had been advised after her stent procedure to replace the chole  catheter to a drainage bag if she experienced pain.  She did put the catheter to a drain bag with immediate relief.  Overnight the tube was leaking around the catheter and the pain became severe.  Bile was still draining into the bag.  I noticed that she was in seveer pain when she arrived today.  I evaluated the site and could see the leaking around the catheter with a small blister at the insertion site where it was secured with a stat lock.  There was bile in the drainage bag but I removed the clave that we had left on to aid in flushing.  At that time the flow increased dramatically and the leaking around the catheter stopped.  Approximately 250 additional mls of bile drained over minutes.  Patient stated he pain was relieved and the leaking had completely stopped.  I secured it with a stat lock to the right side to avoid the blister area.   Dr Laurence Ferrari was consulted and did not need any further intervention. The patient has happy with the outcome and will let us know if she has further problems.  She will return for her regularly scheduled appointment later this month with Dr Laurence Ferrari.

## 2018-02-11 ENCOUNTER — Encounter (HOSPITAL_COMMUNITY): Payer: Self-pay | Admitting: Interventional Radiology

## 2018-02-11 ENCOUNTER — Ambulatory Visit (HOSPITAL_COMMUNITY)
Admission: RE | Admit: 2018-02-11 | Discharge: 2018-02-11 | Disposition: A | Discharge status: Home / Self Care | Payer: Medicare Other | Source: Ambulatory Visit | Attending: Interventional Radiology | Admitting: Interventional Radiology

## 2018-02-11 DIAGNOSIS — Z8507 Personal history of malignant neoplasm of pancreas: Secondary | ICD-10-CM | POA: Insufficient documentation

## 2018-02-11 DIAGNOSIS — Z4682 Encounter for fitting and adjustment of non-vascular catheter: Secondary | ICD-10-CM | POA: Diagnosis present

## 2018-02-11 DIAGNOSIS — C259 Malignant neoplasm of pancreas, unspecified: Secondary | ICD-10-CM

## 2018-02-11 HISTORY — PX: IR CHOLANGIOGRAM EXISTING TUBE: IMG6040

## 2018-02-11 MED ORDER — IOPAMIDOL (ISOVUE-300) INJECTION 61%
50.0000 mL | Freq: Once | INTRAVENOUS | Status: AC | PRN
  Administered 2018-02-11: 15 mL

## 2018-02-11 MED ORDER — IOPAMIDOL (ISOVUE-300) INJECTION 61%
INTRAVENOUS | Status: AC
  Filled 2018-02-11: qty 50

## 2018-02-12 ENCOUNTER — Inpatient Hospital Stay: Payer: Medicare Other

## 2018-02-12 ENCOUNTER — Inpatient Hospital Stay (HOSPITAL_BASED_OUTPATIENT_CLINIC_OR_DEPARTMENT_OTHER): Payer: Medicare Other | Admitting: Oncology

## 2018-02-12 ENCOUNTER — Other Ambulatory Visit: Payer: Self-pay | Admitting: *Deleted

## 2018-02-12 ENCOUNTER — Telehealth: Payer: Self-pay | Admitting: Oncology

## 2018-02-12 VITALS — BP 129/79 | HR 57 | Temp 98.1°F | Resp 17 | Ht 62.0 in | Wt 135.3 lb

## 2018-02-12 DIAGNOSIS — R197 Diarrhea, unspecified: Secondary | ICD-10-CM

## 2018-02-12 DIAGNOSIS — Z8507 Personal history of malignant neoplasm of pancreas: Secondary | ICD-10-CM | POA: Diagnosis not present

## 2018-02-12 DIAGNOSIS — Z7901 Long term (current) use of anticoagulants: Secondary | ICD-10-CM

## 2018-02-12 DIAGNOSIS — C250 Malignant neoplasm of head of pancreas: Secondary | ICD-10-CM

## 2018-02-12 DIAGNOSIS — C541 Malignant neoplasm of endometrium: Secondary | ICD-10-CM

## 2018-02-12 DIAGNOSIS — D701 Agranulocytosis secondary to cancer chemotherapy: Secondary | ICD-10-CM | POA: Diagnosis not present

## 2018-02-12 DIAGNOSIS — I4891 Unspecified atrial fibrillation: Secondary | ICD-10-CM

## 2018-02-12 DIAGNOSIS — Z8542 Personal history of malignant neoplasm of other parts of uterus: Secondary | ICD-10-CM

## 2018-02-12 DIAGNOSIS — Z95828 Presence of other vascular implants and grafts: Secondary | ICD-10-CM

## 2018-02-12 MED ORDER — HYDROCODONE-ACETAMINOPHEN 5-325 MG PO TABS: 1.0000 | ORAL_TABLET | ORAL | 0 refills | Status: DC | PRN

## 2018-02-12 MED ORDER — HEPARIN SOD (PORK) LOCK FLUSH 100 UNIT/ML IV SOLN
500.0000 [IU] | Freq: Once | INTRAVENOUS | Status: AC | PRN
  Administered 2018-02-12: 500 [IU] via INTRAVENOUS
  Filled 2018-02-12: qty 5

## 2018-02-12 MED ORDER — SODIUM CHLORIDE 0.9 % IJ SOLN
10.0000 mL | INTRAMUSCULAR | Status: DC | PRN
  Administered 2018-02-12: 10 mL via INTRAVENOUS
  Filled 2018-02-12: qty 10

## 2018-02-12 NOTE — Telephone Encounter (Signed)
Scheduled appt per 5/23 los - gave patient AVS and calender per los.

## 2018-02-12 NOTE — Progress Notes (Signed)
[facility] OFFICE PROGRESS NOTE   Diagnosis: Pancreas cancer  INTERVAL HISTORY:   [name] returns as scheduled.  She underwent basement of a jejunal stent and exchange for a new external biliary drain on 01/28/2018.  The cytology from a small bowel brushing revealed no evidence of malignancy.  The biliary drain was removed on 02/11/2018.  She reports feeling better since removal of the drain yesterday. She continues to have pain in the right abdomen and flank.  The pain is relieved with hydrocodone.  She takes approximately 3 hydrocodone tablets per day for relief of pain.  She is not using morphine. Objective:  Vital signs in last 24 hours:  Blood pressure 129/79, pulse (!) 57, temperature 98.1 F (36.7 C), temperature source Oral, resp. rate 17, height 5\' 2"  (1.575 m), weight 135 lb 4.8 oz (61.4 kg), SpO2 100 %.   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 It is unclear if this is due to recurrent disease or post radiation fibrosis. On 01/28/2018 she underwent transhepatic placement of a jejunal stent as well as brush biopsy of the jejunal stricture. The brush biopsy came back with reactive cells only. She presents today for cholangiogram through existing tube and possible biliary drain removal. Her tube has been capped since Monday and she has tolerated it well. Overall, she is feeling better. EXAM: CHOLANGIOGRAM VIA EXISTING CATHETER MEDICATIONS: None. ANESTHESIA/SEDATION: None. FLUOROSCOPY  TIME:  Fluoroscopy Time: 0 minutes 42 seconds (4 mGy). COMPLICATIONS: None immediate. PROCEDURE: Informed written consent was obtained from the patient after a thorough discussion of the procedural risks, benefits and alternatives. All questions were addressed. A timeout was performed prior to the initiation of the procedure. A hand injection of contrast material was performed. The biliary drain has pulled back out of the common hepatic duct and into the left-sided hepatic ducts. Contrast opacifies the biliary tree. There is wide patency of the hepaticojejunostomy. The afferent limb and pancreatic duct fill. Contrast material also passes through the jejunal stent which has expanded since the initial placement images. The biliary drain was cut and removed. IMPRESSION: 1. Patent hepaticojejunostomy and jejunal stent. 2. The percutaneous biliary drain was removed. Signed, Criselda Peaches, MD [facility] Radiology Electronically Signed   By: Jacqulynn Cadet M.D.   On: 02/11/2018 16:30    Medications: I have reviewed the patient's current medications.   Assessment/Plan: 1. 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  Gemcitabine held 11/02/2014 due to neutropenia.  Gemcitabine 11/09/2014 dose reduced 800 mg/m.  Gemcitabine held 11/16/2014 due to neutropenia.  Gemcitabine resumed 11/23/2014 every 2 week schedule.  Cycle 6 gemcitabine 01/05/2015  Cycle 7 gemcitabine 01/18/2015  Cycle 8 gemcitabine 02/01/2015  Cycle 9 gemcitabine 02/15/2015  Cycle 10 gemcitabine 03/01/2015  Cycle 11 gemcitabine  03/15/2015  Cycle  12 gemcitabine 03/29/2015  Elevated CA 19-01 May 2016  CTs 06/17/2016-new soft tissue mass at the root of the mesentery with vascular involvement  PET 06/27/2016-hypermetabolic activity associated with soft tissue adjacent to surgical clips in the central mesentery  Status post SBRTto the mesenteric mass completed 07/26/2016  CT abdomen/pelvis 08/23/2016 -mesenteric mass stable to slightly decreased in size.  Cycle 1 FOLFOX 09/03/2016  Cycle 2 FOLFOX 09/30/2016  Cycle 3 FOLFOX 10/14/2016   Cycle 4 FOLFOX 11/05/2016 (5-FU bolus eliminated and 5-FU pump dose reduced)  Cycle 5 FOLFOX 11/19/2016  CT abdomen/pelvis 11/28/2016-decreased size of soft tissue at the small bowel mesentery, mild asymmetric soft tissue at the left vaginal cuff  Cycle 6 FOLFOX 12/10/2016 (5-FU infusion further reduced and oxaliplatin reduced)  Cycle 7 FOLFOX 12/31/2016  Cycle 8 FOLFOX 01/21/2017   CT 02/24/2017-slight decrease in size of the mesenteric mass, resolution of soft tissue fullness at the left vaginal cuff, no evidence of disease progression  CT abdomen/pelvis 06/20/2017-stable ill-defined soft tissue at the mesenteric root, mild increased asymmetry at the left vaginal cuff, no other evidence of disease progression  Elevated CA 19-9 09/11/2018  CT abdomen/pelvis 11/18/2017-no evidence of recurrent pancreas cancer, dilated afferentloop, intrahepatic biliary dilatation  PET scan 01/16/2018-low level hypermetabolic activity in the porta hepatis which appears to correspond with a mildly enlarged lymph node on MRI.  No other suspicious nodal activity in the abdomen.  Small right paratracheal and subcarinal lymph nodes with low-level hypermetabolic activity in the chest.  No abnormal activity within the liver.  Findings suspicious for obstruction at the right ureteropelvic junction.  Asymmetric left oropharyngeal activity.   2. bile duct obstruction secondary to #1, status  post an ERCP with stent placement 09/23/2015hypermetabolic soft tissue in the central mesentery, no other evidence of metastatic disease, stable mildly enlarged portal caval node  3. Admission with post ERCP pancreatitis 06/16/2014  4. History of abdominal pain secondary to #1  5. Pulmonary embolism diagnosed on a CT of the abdomen 09/16/2014  Negative lower extremity Dopplers 09/17/2014  6. Multiple orthopedic surgical procedures  7. Endometrial cancer,stage IA, grade 1 endometrioid adenocarcinoma, 18% myometrial invasion, no lymphovascular space involvement, negative washings  Status post robotic total hysterectomy and bilateral salpingo-oophorectomy 11/30/2010  Recurrent tumor left lateral vagina status post biopsy 11/24/2014 with pathology confirming adenocarcinoma with focal squamous differentiation consistent with endometrial adenocarcinoma  Staging CT scans 12/06/2014 with no evidence of local pancreatic cancer recurrence. Small fluid collection adjacent to the left adrenal gland. Severe hepatic steatosis. No evidence of local extension of endometrial carcinoma. Carcinoma not well-defined at the vaginal cuff. 5 mm right external iliac lymph node. 3.6 mm left external iliac lymph node  Brachytherapy initiated 12/22/2014, completed 01/19/2015  CT abdomen/pelvis 07/24/2015 revealed a 3 x 4 cm soft tissue focus at the vaginal apex  PET scan 08/11/2015 revealed no mass at the vaginal apex and no evidence of metastatic disease  CT 11/28/2016-mild asymmetric soft tissue at the left vaginal cuff, resolved on CT 02/24/2017  CT abdomen/pelvis 06/20/2017-stable mild ill-defined soft tissue density in the mesenteric root. Stable mild portacaval lymphadenopathy and subcentimeter right retroperitoneal lymph nodes. Mild increased size of asymmetric soft tissue density involving the left vaginal cuff.  PET scan 07/19/2017-no suspicious hypermetabolic activity within the neck,  chest, abdomen or pelvis. Specifically no evidence of residual hypermetabolic tumor in the surgical bed or at the vaginal cuff. Hypermetabolic activity in the lumbar spine at the level of the right L3-4 facet joint appears degenerative.  MRI  abdomen 12/02/2017-focal area of abnormal signal in the caudate lobe of the liver-unclear etiology, dilation of the hepaticojejunostomy loop, bile ducts, and pancreatic duct-stricture formation versus recurrent tumor  8. History of atrial fibrillation-maintained on xarelto  9. Family history of multiple cancers-negative CancerNext gene panel  10. Prolonged nausea following the pancreaticoduodenectomy. Improved 10/26/2014.  11. Port-A-Cath placement 10/21/2014.  12. History of Neutropenia secondary to chemotherapy   13. Diarrhea. Question pancreatic insufficiency. Pancreatic enzyme replacement initiated 01/05/2015. Recurrent diarrhea following a course of antibiotics March 2017.  14. History of positional vertigo-resolved  15. 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 Neutropenia secondary to chemotherapy 09/18/2016.  17. Delayed nausea and diarrhea following FOLFOX. Emend added with cycle 2. Decadron prophylaxis added with cycle 3  18. Diarrhea 10/28/2016. Question related to chemotherapy. Negative C. difficile testing 10/31/2016.  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  20.Admission 12/02/2017 with Bacteroides bacteremia  Biliary obstruction documented on CT/MRI 12/02/2017  Upper endoscopy 12/05/2017 revealed angulation of the apparent limb precluding intubation  Status post placement of a biliary drain 12/05/2017, replaced 12/09/2017  Bile duct brushings 12/09/2017-negative for malignancy  Biliary drain cholangiogram 12/18/2017-there is patency of the biliary-enteric  anastomosis with no contrast traverses the afferentlimb into the jejunum  Jejunal stent placed 01/28/2018, brush biopsy negative for malignancy  Biliary drain removed 02/11/2018  21.  Admission 12/29/2017 through 01/02/2018 with sepsis secondary to Klebsiella bacteremia/cholangitis.  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  Internal/external biliary drainage catheter placed.  22.  Right pelvicaliectasis on the PET scan 01/16/2018 with perinephric soft tissue fullness, the right ureter is not dilated    Disposition: [name] appears unchanged.  She underwent placement of a jejunal stent earlier this month.  The biliary drain has been removed.  She continues to have discomfort in the right abdomen and right flank.  The pain may be related to carcinomatosis or obstruction of the right renal collecting system.  She does wish to undergo another procedure at present.  She will consider placement of a right ureter stent in the future.  The plan is to follow her with observation.  She will continue hydrocodone for pain.  We will follow-up on the CA 19-9 from today.  [name] will return for an office visit and Port-A-Cath flush in 6 weeks.  25 minutes were spent with the patient today.  The majority of the time was used for counseling and coordination of care. Betsy Coder, MD  02/12/2018  10:18 AM

## 2018-02-13 ENCOUNTER — Telehealth: Payer: Self-pay

## 2018-02-13 ENCOUNTER — Encounter: Payer: Self-pay | Admitting: Oncology

## 2018-02-13 LAB — CANCER ANTIGEN 19-9: CA 19-9: 372 U/mL — ABNORMAL HIGH (ref 0–35)

## 2018-02-13 NOTE — Telephone Encounter (Addendum)
Pt voiced understanding of message below  ----- Message from Ladell Pier, MD sent at 02/13/2018  7:35 AM EDT ----- Please call patient, ca19-9 is stable

## 2018-02-24 ENCOUNTER — Other Ambulatory Visit (HOSPITAL_COMMUNITY): Payer: Medicare Other

## 2018-03-05 ENCOUNTER — Other Ambulatory Visit: Payer: Self-pay

## 2018-03-05 MED ORDER — FLECAINIDE ACETATE 100 MG PO TABS: 100.0000 mg | ORAL_TABLET | Freq: Two times a day (BID) | ORAL | 3 refills | Status: AC

## 2018-03-20 ENCOUNTER — Telehealth: Payer: Self-pay | Admitting: Cardiology

## 2018-03-20 NOTE — Telephone Encounter (Signed)
Reviewed with Dr Marlou Porch who gives verbal orders for pt to increase her Metoprolol back to 25 mg a day and continue to monitor her BP.  Pt aware and agreeable.  She will go ahead and take another 12.5 mg now.   She will contact us if BP and HR continue to be elevated after the increase in med.  She was again grateful for the call and information.

## 2018-03-20 NOTE — Telephone Encounter (Signed)
Tuesday night BP was 245/109 Left arm and 172/109 in right.  Wednesday she "did OK" but yesterday felt really drained and sick to her stomach.  Last night her head started pounding, shaking, chills and H/A during the night.  When she checked her BP it was 212/110 HR 110.  She took her medications about 5 am this morning.  BP now 172/90 HR 85.  She in the past was on 25 mg of metoprolol but it was decreased while she was having chemotherapy for pancreatic cancer and her BP kept dropping too low.  Advised I will have Dr Marlou Porch review and call back with further instructions.  Pt was very tearful during the call.  Reassurance given and asked her to try to relax and calm down to aid in lowing her BP. She states understanding and was appreciative for the call.

## 2018-03-20 NOTE — Telephone Encounter (Signed)
New message   Pt c/o BP issue: STAT if pt c/o blurred vision, one-sided weakness or slurred speech  1. What are your last 5 BP readings? 139/90 HR 85, 171/106 HR 99, 211/109 HR 110   2. Are you having any other symptoms (ex. Dizziness, headache, blurred vision, passed out)?  Headache, chills  3. What is your BP issue? High BP

## 2018-03-24 ENCOUNTER — Inpatient Hospital Stay: Payer: Medicare Other | Attending: Oncology | Admitting: Nurse Practitioner

## 2018-03-24 ENCOUNTER — Telehealth: Payer: Self-pay | Admitting: Oncology

## 2018-03-24 ENCOUNTER — Encounter: Payer: Self-pay | Admitting: Nurse Practitioner

## 2018-03-24 ENCOUNTER — Inpatient Hospital Stay: Payer: Medicare Other

## 2018-03-24 VITALS — BP 138/80 | HR 62 | Temp 98.8°F | Resp 17 | Ht 62.0 in | Wt 133.3 lb

## 2018-03-24 DIAGNOSIS — C250 Malignant neoplasm of head of pancreas: Secondary | ICD-10-CM | POA: Diagnosis not present

## 2018-03-24 DIAGNOSIS — N139 Obstructive and reflux uropathy, unspecified: Secondary | ICD-10-CM | POA: Diagnosis not present

## 2018-03-24 DIAGNOSIS — Z5111 Encounter for antineoplastic chemotherapy: Secondary | ICD-10-CM | POA: Diagnosis present

## 2018-03-24 DIAGNOSIS — Z452 Encounter for adjustment and management of vascular access device: Secondary | ICD-10-CM | POA: Insufficient documentation

## 2018-03-24 DIAGNOSIS — Z8542 Personal history of malignant neoplasm of other parts of uterus: Secondary | ICD-10-CM | POA: Insufficient documentation

## 2018-03-24 DIAGNOSIS — C541 Malignant neoplasm of endometrium: Secondary | ICD-10-CM

## 2018-03-24 DIAGNOSIS — Z95828 Presence of other vascular implants and grafts: Secondary | ICD-10-CM

## 2018-03-24 DIAGNOSIS — D701 Agranulocytosis secondary to cancer chemotherapy: Secondary | ICD-10-CM | POA: Diagnosis not present

## 2018-03-24 LAB — CMP (CANCER CENTER ONLY)
ALBUMIN: 3.2 g/dL — AB (ref 3.5–5.0)
ALT: 34 U/L (ref 0–44)
ANION GAP: 6 (ref 5–15)
AST: 72 U/L — AB (ref 15–41)
Alkaline Phosphatase: 421 U/L — ABNORMAL HIGH (ref 38–126)
BUN: 13 mg/dL (ref 8–23)
CO2: 25 mmol/L (ref 22–32)
Calcium: 8.9 mg/dL (ref 8.9–10.3)
Chloride: 101 mmol/L (ref 98–111)
Creatinine: 0.84 mg/dL (ref 0.44–1.00)
GFR, Est AFR Am: >60 mL/min (ref >60)
GFR, Estimated: >60 mL/min (ref >60)
GLUCOSE: 101 mg/dL — AB (ref 70–99)
POTASSIUM: 3.9 mmol/L (ref 3.5–5.1)
SODIUM: 132 mmol/L — AB (ref 135–145)
TOTAL PROTEIN: 6.6 g/dL (ref 6.5–8.1)
Total Bilirubin: 0.5 mg/dL (ref 0.3–1.2)

## 2018-03-24 MED ORDER — HYDROCODONE-ACETAMINOPHEN 5-325 MG PO TABS: 1.0000 | ORAL_TABLET | ORAL | 0 refills | Status: DC | PRN

## 2018-03-24 MED ORDER — SODIUM CHLORIDE 0.9 % IJ SOLN
10.0000 mL | INTRAMUSCULAR | Status: DC | PRN
  Administered 2018-03-24: 10 mL via INTRAVENOUS
  Filled 2018-03-24: qty 10

## 2018-03-24 MED ORDER — HEPARIN SOD (PORK) LOCK FLUSH 100 UNIT/ML IV SOLN
500.0000 [IU] | Freq: Once | INTRAVENOUS | Status: AC | PRN
  Administered 2018-03-24: 500 [IU] via INTRAVENOUS
  Filled 2018-03-24: qty 5

## 2018-03-24 NOTE — Telephone Encounter (Signed)
Appointments scheduled AVS/Calendar printed per 7/2 los °

## 2018-03-24 NOTE — Progress Notes (Addendum)
[facility] OFFICE PROGRESS NOTE   Diagnosis: Pancreas cancer  INTERVAL HISTORY:   [name] returns as scheduled.  She reports progressive pain across the abdomen and back over the past 6 weeks.  She is taking hydrocodone about every 4 hours with partial relief.  She has intermittent nausea.  Oral intake is poor.  She states she is hungry but develops nausea when she tries to eat.  Occasional vomiting.  No diarrhea.  No fever.  Objective:  Vital signs in last 24 hours:  Blood pressure 138/80, pulse 62, temperature 98.8 F (37.1 C), temperature source Oral, resp. rate 17, height 5\' 2"  (1.575 m), weight 133 lb 4.8 oz (60.5 kg), SpO2 99 %.    HEENT: No thrush or ulcers. Resp: Lungs clear bilaterally. Cardio: Regular rate and rhythm. GI: Abdomen is soft with generalized mild tenderness.  No hepatomegaly.  No mass. Vascular: No leg edema. Port-A-Cath without erythema.  Lab Results:  Lab Results  Component Value Date   WBC 5.9 01/28/2018   HGB 10.6 (L) 01/28/2018   HCT 31.1 (L) 01/28/2018   MCV 86.1 01/28/2018   PLT 283 01/28/2018   NEUTROABS 10.0 (H) 12/29/2017    Imaging:  No results found.  Medications: I have reviewed the patient's current medications.  Assessment/Plan: 1. 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  Gemcitabine held 11/02/2014 due to neutropenia.  Gemcitabine 11/09/2014 dose reduced 800 mg/m.  Gemcitabine held 11/16/2014 due to neutropenia.  Gemcitabine resumed 11/23/2014 every 2 week schedule.  Cycle 6 gemcitabine 01/05/2015  Cycle 7 gemcitabine 01/18/2015  Cycle 8 gemcitabine  02/01/2015  Cycle 9 gemcitabine 02/15/2015  Cycle 10 gemcitabine 03/01/2015  Cycle 11 gemcitabine 03/15/2015  Cycle 12 gemcitabine 03/29/2015  Elevated CA 19-01 May 2016  CTs 06/17/2016-new soft tissue mass at the root of the mesentery with vascular involvement  PET 06/27/2016-hypermetabolic activity associated with soft tissue adjacent to surgical clips in the central mesentery  Status post SBRTto the mesenteric mass completed 07/26/2016  CT abdomen/pelvis 08/23/2016 -mesenteric mass stable to slightly decreased in size.  Cycle 1 FOLFOX 09/03/2016  Cycle 2 FOLFOX 09/30/2016  Cycle 3 FOLFOX 10/14/2016   Cycle 4 FOLFOX 11/05/2016 (5-FU bolus eliminated and 5-FU pump dose reduced)  Cycle 5 FOLFOX 11/19/2016  CT abdomen/pelvis 11/28/2016-decreased size of soft tissue at the small bowel mesentery, mild asymmetric soft tissue at the left vaginal cuff  Cycle 6 FOLFOX 12/10/2016 (5-FU infusion further reduced and oxaliplatin reduced)  Cycle 7 FOLFOX 12/31/2016  Cycle 8 FOLFOX 01/21/2017   CT 02/24/2017-slight decrease in size of the mesenteric mass, resolution of soft tissue fullness at the left vaginal cuff, no evidence of disease progression  CT abdomen/pelvis 06/20/2017-stable ill-defined soft tissue at the mesenteric root, mild increased asymmetry at the left vaginal cuff, no other evidence of disease progression  Elevated CA 19-9 09/11/2018  CT abdomen/pelvis 11/18/2017-no evidence of recurrent pancreas cancer, dilated afferentloop, intrahepatic biliary dilatation  PET scan 01/16/2018-low level hypermetabolic activity in the porta hepatis which appears to correspond with a mildly enlarged lymph node on MRI. No other suspicious nodal activity in the abdomen. Small right paratracheal and subcarinal lymph nodes with low-level hypermetabolic activity in the chest. No abnormal activity within the liver. Findings suspicious  for obstruction at the right ureteropelvic  junction. Asymmetric left oropharyngeal activity.   2. bile duct obstruction secondary to #1, status post an ERCP with stent placement 09/23/2015hypermetabolic soft tissue in the central mesentery, no other evidence of metastatic disease, stable mildly enlarged portal caval node  3. Admission with post ERCP pancreatitis 06/16/2014  4. History of abdominal pain secondary to #1  5. Pulmonary embolism diagnosed on a CT of the abdomen 09/16/2014  Negative lower extremity Dopplers 09/17/2014  6. Multiple orthopedic surgical procedures  7. Endometrial cancer,stage IA, grade 1 endometrioid adenocarcinoma, 18% myometrial invasion, no lymphovascular space involvement, negative washings  Status post robotic total hysterectomy and bilateral salpingo-oophorectomy 11/30/2010  Recurrent tumor left lateral vagina status post biopsy 11/24/2014 with pathology confirming adenocarcinoma with focal squamous differentiation consistent with endometrial adenocarcinoma  Staging CT scans 12/06/2014 with no evidence of local pancreatic cancer recurrence. Small fluid collection adjacent to the left adrenal gland. Severe hepatic steatosis. No evidence of local extension of endometrial carcinoma. Carcinoma not well-defined at the vaginal cuff. 5 mm right external iliac lymph node. 3.6 mm left external iliac lymph node  Brachytherapy initiated 12/22/2014, completed 01/19/2015  CT abdomen/pelvis 07/24/2015 revealed a 3 x 4 cm soft tissue focus at the vaginal apex  PET scan 08/11/2015 revealed no mass at the vaginal apex and no evidence of metastatic disease  CT 11/28/2016-mild asymmetric soft tissue at the left vaginal cuff, resolved on CT 02/24/2017  CT abdomen/pelvis 06/20/2017-stable mild ill-defined soft tissue density in the mesenteric root. Stable mild portacaval lymphadenopathy and subcentimeter right retroperitoneal lymph nodes. Mild increased size of asymmetric soft tissue density  involving the left vaginal cuff.  PET scan 07/19/2017-no suspicious hypermetabolic activity within the neck, chest, abdomen or pelvis. Specifically no evidence of residual hypermetabolic tumor in the surgical bed or at the vaginal cuff. Hypermetabolic activity in the lumbar spine at the level of the right L3-4 facet joint appears degenerative.  MRI abdomen 12/02/2017-focal area of abnormal signal in the caudate lobe of the liver-unclear etiology, dilation of the hepaticojejunostomy loop, bile ducts, and pancreatic duct-stricture formation versus recurrent tumor  8. History of atrial fibrillation-maintained on xarelto  9. Family history of multiple cancers-negative CancerNext gene panel  10. Prolonged nausea following the pancreaticoduodenectomy. Improved 10/26/2014.  11. Port-A-Cath placement 10/21/2014.  12. History of Neutropenia secondary to chemotherapy   13. Diarrhea. Question pancreatic insufficiency. Pancreatic enzyme replacement initiated 01/05/2015. Recurrent diarrhea following a course of antibiotics March 2017.  14. History of positional vertigo-resolved  15. 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 Neutropenia secondary to chemotherapy 09/18/2016.  17. Delayed nausea and diarrhea following FOLFOX. Emend added with cycle 2. Decadron prophylaxis added with cycle 3  18. Diarrhea 10/28/2016. Question related to chemotherapy. Negative C. difficile testing 10/31/2016.  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  20.Admission 12/02/2017 with Bacteroides bacteremia  Biliary obstruction documented on CT/MRI 12/02/2017  Upper endoscopy 12/05/2017 revealed angulation of the apparent limb precluding intubation  Status post placement of a biliary drain 12/05/2017, replaced 12/09/2017  Bile duct brushings  12/09/2017-negative for malignancy  Biliary drain cholangiogram 12/18/2017-there is patency of the biliary-enteric anastomosis with no contrast traverses the afferentlimb into the jejunum  Jejunal stent placed 01/28/2018, brush biopsy negative for malignancy  Biliary drain removed 02/11/2018  21.Admission 12/29/2017 through 01/02/2018 with sepsis secondary to Klebsiella bacteremia/cholangitis. Cholangiogram 12/30/2017 showed high-grade stenosis/stricture of the proximal draining jejunal loops.Internal/external biliary drainage  catheter placed.  22.  Right pelvicaliectasis on the PET scan 01/16/2018 with perinephric soft tissue fullness, the right ureter is not dilated   Disposition: [name] appears unchanged.  She is having increased abdominal pain.  She will try taking 2 hydrocodone tablets every 4 hours as needed.  We discussed a long-acting pain medication if this is not effective.  We will follow-up on the CA-19-9 from today.  She will return for lab and follow-up in 4 weeks.  She will contact the office in the interim with any problems.  Patient seen with Dr. [name].    Ned Card ANP/GNP-BC   03/24/2018  10:13 AM  This was a shared visit with Ned Card.  [name] appears unchanged.  I suspect the abdominal pain is related to local recurrence of pancreas cancer.  We adjusted the narcotic regimen.  She will return for an office visit in 4 weeks.  We will plan for a restaging CT evaluation within the next few months.  The plan is to initiate salvage gemcitabine/Abraxane if there is radiologic evidence of disease progression.  Julieanne Manson, MD

## 2018-03-25 ENCOUNTER — Other Ambulatory Visit: Payer: Self-pay | Admitting: Oncology

## 2018-03-25 DIAGNOSIS — C259 Malignant neoplasm of pancreas, unspecified: Secondary | ICD-10-CM

## 2018-03-25 LAB — CANCER ANTIGEN 19-9: CA 19-9: 734 U/mL — ABNORMAL HIGH (ref 0–35)

## 2018-03-28 ENCOUNTER — Other Ambulatory Visit: Payer: Self-pay | Admitting: Cardiology

## 2018-03-31 ENCOUNTER — Ambulatory Visit (HOSPITAL_COMMUNITY)
Admission: RE | Admit: 2018-03-31 | Discharge: 2018-03-31 | Disposition: A | Discharge status: Home / Self Care | Payer: Medicare Other | Source: Ambulatory Visit | Attending: Oncology | Admitting: Oncology

## 2018-03-31 ENCOUNTER — Encounter (HOSPITAL_COMMUNITY): Payer: Self-pay

## 2018-03-31 DIAGNOSIS — C259 Malignant neoplasm of pancreas, unspecified: Secondary | ICD-10-CM | POA: Insufficient documentation

## 2018-03-31 DIAGNOSIS — K838 Other specified diseases of biliary tract: Secondary | ICD-10-CM | POA: Diagnosis not present

## 2018-03-31 DIAGNOSIS — N139 Obstructive and reflux uropathy, unspecified: Secondary | ICD-10-CM | POA: Insufficient documentation

## 2018-03-31 MED ORDER — IOPAMIDOL (ISOVUE-300) INJECTION 61%
INTRAVENOUS | Status: AC
  Filled 2018-03-31: qty 100

## 2018-03-31 MED ORDER — HEPARIN SOD (PORK) LOCK FLUSH 100 UNIT/ML IV SOLN
INTRAVENOUS | Status: AC
  Administered 2018-03-31: 500 [IU]
  Filled 2018-03-31: qty 5

## 2018-03-31 MED ORDER — IOPAMIDOL (ISOVUE-300) INJECTION 61%
100.0000 mL | Freq: Once | INTRAVENOUS | Status: AC | PRN
  Administered 2018-03-31: 100 mL via INTRAVENOUS

## 2018-04-01 ENCOUNTER — Telehealth: Payer: Self-pay

## 2018-04-01 ENCOUNTER — Encounter: Payer: Self-pay | Admitting: Nurse Practitioner

## 2018-04-01 ENCOUNTER — Inpatient Hospital Stay (HOSPITAL_BASED_OUTPATIENT_CLINIC_OR_DEPARTMENT_OTHER): Payer: Medicare Other | Admitting: Nurse Practitioner

## 2018-04-01 VITALS — BP 130/85 | HR 52 | Temp 97.9°F | Resp 17 | Ht 62.0 in | Wt 128.5 lb

## 2018-04-01 DIAGNOSIS — N139 Obstructive and reflux uropathy, unspecified: Secondary | ICD-10-CM

## 2018-04-01 DIAGNOSIS — Z8542 Personal history of malignant neoplasm of other parts of uterus: Secondary | ICD-10-CM | POA: Diagnosis not present

## 2018-04-01 DIAGNOSIS — Z5111 Encounter for antineoplastic chemotherapy: Secondary | ICD-10-CM | POA: Diagnosis not present

## 2018-04-01 DIAGNOSIS — Z7189 Other specified counseling: Secondary | ICD-10-CM

## 2018-04-01 DIAGNOSIS — C250 Malignant neoplasm of head of pancreas: Secondary | ICD-10-CM

## 2018-04-01 NOTE — Progress Notes (Signed)
DISCONTINUE ON PATHWAY REGIMEN - Pancreatic     A cycle is every 14 days:     Oxaliplatin        Dose Mod: None     Leucovorin        Dose Mod: None     Irinotecan        Dose Mod: None     5-Fluorouracil        Dose Mod: None     5-Fluorouracil        Dose Mod: None  **Always confirm dose/schedule in your pharmacy ordering system**  REASON: Disease Progression PRIOR TREATMENT: [id number]: FOLFIRINOX q14 Days x 6 Months TREATMENT RESPONSE: Progressive Disease (PD)  START ON PATHWAY REGIMEN - Pancreatic     A cycle is every 28 days:     Nab-paclitaxel (protein bound)      Gemcitabine   **Always confirm dose/schedule in your pharmacy ordering system**  Patient Characteristics: Adenocarcinoma, Metastatic Disease, Second Line, MSS/pMMR or MSI Unknown, If FOLFIRINOX First Line Histology: Adenocarcinoma Current evidence of distant metastases<= Yes AJCC T Category: Staged < 8th Ed. AJCC N Category: Staged < 8th Ed. AJCC M Category: Staged < 8th Ed. AJCC 8 Stage Grouping: Staged < 8th Ed. Line of Therapy: Second Engineer, civil (consulting) Status: Unknown Intent of Therapy: Non-Curative / Palliative Intent, Discussed with Patient

## 2018-04-01 NOTE — Progress Notes (Addendum)
[place] OFFICE PROGRESS NOTE   Diagnosis: Pancreas cancer  INTERVAL HISTORY:   [name] returns as scheduled.  She continues to have back and abdominal pain.  She feels overall the pain is fairly well-controlled with hydrocodone.  She is having intermittent nausea/vomiting and diarrhea.  She has periodic low-grade fevers.  Appetite is poor due to the nausea as well as abdominal bloating after eating.  She has lost some weight since her last visit.  She reports persistent numbness in the feet and fingertips.  Her feet feel cold at nighttime.  Objective:  Vital signs in last 24 hours:  Blood pressure 130/85, pulse (!) 52, temperature 97.9 F (36.6 C), temperature source Oral, resp. rate 17, height 5\' 2"  (1.575 m), weight 128 lb 8 oz (58.3 kg), SpO2 98 %.    HEENT: No thrush or ulcers. Resp: Lungs clear bilaterally. Cardio: Regular rate and rhythm. GI: Abdomen with generalized tenderness.  Fullness right upper mid abdomen.  No hepatomegaly. Vascular: No leg edema. Port-A-Cath without erythema.  Lab Results:  Lab Results  Component Value Date   WBC 5.9 01/28/2018   HGB 10.6 (L) 01/28/2018   HCT 31.1 (L) 01/28/2018   MCV 86.1 01/28/2018   PLT 283 01/28/2018   NEUTROABS 10.0 (H) 12/29/2017    Imaging:  Ct Abdomen Pelvis W Contrast  Result Date: 03/31/2018 CLINICAL DATA:  History of pancreatic cancer EXAM: CT ABDOMEN AND PELVIS WITH CONTRAST TECHNIQUE: Multidetector CT imaging of the abdomen and pelvis was performed using the standard protocol following bolus administration of intravenous contrast. CONTRAST:  182mL ISOVUE-300 IOPAMIDOL (ISOVUE-300) INJECTION 61% COMPARISON:  CT AP 12/02/2017 FINDINGS: Lower chest: No acute abnormality. Hepatobiliary: There is been interval progression intrahepatic biliary dilatation and dilatation of the common bile duct. The CBD measures 1.7 cm, image 39/2. Previously 1.1 cm. No focal liver abnormality identified to suggest  liver metastases. Pancreas: Postoperative changes from Whipple procedure noted. There has been further progression of pancreatic atrophy with main duct dilatation measuring up to 5 mm. Spleen: Normal appearance of the spleen. Adrenals/Urinary Tract: The adrenal glands appear normal. Normal left kidney. Progressive, right-sided high-grade obstructive uropathy is identified. Progressive retroperitoneal soft tissue encases the right renal pelvis with asymmetric decreased enhancement of the right kidney with absent excretion of contrast on the delayed images noted. Stomach/Bowel: Postoperative anatomy of the upper GI tract compatible with previous Whipple procedure. There is been interval placement of a metallic stent within the duodenojejunostomy loop. Increasing soft tissue encasing the duodenal loop is identified. Single wall thickness of the duodenum now measures 1.5 cm, image 20/13. Previously 0.9 cm. Vascular/Lymphatic: The abdominal aorta has a normal caliber. Mild aortic atherosclerosis. There is partial soft tissue encasement of the celiac trunk, image 35/2 and progressive soft tissue encasement of the superior mesenteric artery, image 37/2. Right retroperitoneal tumor which encases the IVC and invades the right renal pelvis extends from the takeoff of the right renal artery measuring 9.8 by 4.1 by 5.0 cm. On the previous CT from 12/02/2017 this measured 5.7 x 3.0 by 2.0 cm. Tumor extends along the retroperitoneum into the right common iliac nodal chain, image 77/7. New from previous exam. No pelvic or inguinal adenopathy identified. Reproductive: Status post hysterectomy. No adnexal masses. Other: No free fluid or fluid collections identified. Musculoskeletal: No acute or significant osseous findings. IMPRESSION: 1. Interval progression of disease. There is been significant interval increase in rib right-sided retroperitoneal soft tissue which invades and encases the IVC, celiac trunk, SMA,  duodenum, and right  renal pelvis. 2. Interval progression of intrahepatic and common bile duct dilatation. 3. New right-sided obstructive uropathy secondary to tumor invasion of the right renal pelvis. Electronically Signed   By: Kerby Moors M.D.   On: 03/31/2018 16:45    Medications: I have reviewed the patient's current medications.  Assessment/Plan: 1. 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  Gemcitabine held 11/02/2014 due to neutropenia.  Gemcitabine 11/09/2014 dose reduced 800 mg/m.  Gemcitabine held 11/16/2014 due to neutropenia.  Gemcitabine resumed 11/23/2014 every 2 week schedule.  Cycle 6 gemcitabine 01/05/2015  Cycle 7 gemcitabine 01/18/2015  Cycle 8 gemcitabine 02/01/2015  Cycle 9 gemcitabine 02/15/2015  Cycle 10 gemcitabine 03/01/2015  Cycle 11 gemcitabine 03/15/2015  Cycle 12 gemcitabine 03/29/2015  Elevated CA 19-01 May 2016  CTs 06/17/2016-new soft tissue mass at the root of the mesentery with vascular involvement  PET 06/27/2016-hypermetabolic activity associated with soft tissue adjacent to surgical clips in the central mesentery  Status post SBRTto the mesenteric mass completed 07/26/2016  CT abdomen/pelvis 08/23/2016 -mesenteric mass stable to slightly decreased in size.  Cycle 1 FOLFOX 09/03/2016  Cycle 2 FOLFOX 09/30/2016  Cycle 3 FOLFOX 10/14/2016   Cycle 4 FOLFOX 11/05/2016 (5-FU bolus eliminated and 5-FU pump dose reduced)  Cycle 5 FOLFOX 11/19/2016  CT abdomen/pelvis 11/28/2016-decreased size of soft tissue at the small bowel mesentery, mild asymmetric soft tissue at the left vaginal cuff  Cycle 6 FOLFOX  12/10/2016 (5-FU infusion further reduced and oxaliplatin reduced)  Cycle 7 FOLFOX 12/31/2016  Cycle 8 FOLFOX 01/21/2017   CT 02/24/2017-slight decrease in size of the mesenteric mass, resolution of soft tissue fullness at the left vaginal cuff, no evidence of disease progression  CT abdomen/pelvis 06/20/2017-stable ill-defined soft tissue at the mesenteric root, mild increased asymmetry at the left vaginal cuff, no other evidence of disease progression  Elevated CA 19-9 09/11/2018  CT abdomen/pelvis 11/18/2017-no evidence of recurrent pancreas cancer, dilated afferentloop, intrahepatic biliary dilatation  PET scan 01/16/2018-low level hypermetabolic activity in the porta hepatis which appears to correspond with a mildly enlarged lymph node on MRI. No other suspicious nodal activity in the abdomen. Small right paratracheal and subcarinal lymph nodes with low-level hypermetabolic activity in the chest. No abnormal activity within the liver. Findings suspicious for obstruction at the right ureteropelvic junction. Asymmetric left oropharyngeal activity.  CT abdomen/pelvis 03/31/2018- significant increase in right-sided retroperitoneal soft tissue which invades and encases the IVC, celiac trunk, SMA, duodenum and right renal pelvis.  Interval progression of intrahepatic and common bile duct dilatation.  New right-sided obstructive uropathy secondary to tumor invasion of the right renal pelvis.   2. bile duct obstruction secondary to #1, status post an ERCP with stent placement 09/23/2015hypermetabolic soft tissue in the central mesentery, no other evidence of metastatic disease, stable mildly enlarged portal caval node  3. Admission with post ERCP pancreatitis 06/16/2014  4. History of abdominal pain secondary to #1  5. Pulmonary embolism diagnosed on a CT of the abdomen 09/16/2014  Negative lower extremity Dopplers 09/17/2014  6. Multiple orthopedic surgical  procedures  7. Endometrial cancer,stage IA, grade 1 endometrioid adenocarcinoma, 18% myometrial invasion, no lymphovascular space involvement, negative washings  Status post robotic total hysterectomy and bilateral salpingo-oophorectomy 11/30/2010  Recurrent tumor left lateral vagina status post biopsy 11/24/2014 with pathology confirming adenocarcinoma with focal squamous differentiation consistent with endometrial adenocarcinoma  Staging CT scans 12/06/2014 with no evidence of local pancreatic cancer recurrence. Small fluid collection adjacent to the left adrenal gland. Severe hepatic steatosis. No evidence of local extension of endometrial carcinoma. Carcinoma not well-defined at the vaginal cuff. 5 mm right external iliac lymph node. 3.6 mm left external iliac lymph node  Brachytherapy initiated 12/22/2014, completed 01/19/2015  CT abdomen/pelvis 07/24/2015 revealed a 3 x 4 cm soft tissue focus at the vaginal apex  PET scan 08/11/2015 revealed no mass at the vaginal apex and no evidence of metastatic disease  CT 11/28/2016-mild asymmetric soft tissue at the left vaginal cuff, resolved on CT 02/24/2017  CT abdomen/pelvis 06/20/2017-stable mild ill-defined soft tissue density in the mesenteric root. Stable mild portacaval lymphadenopathy and subcentimeter right retroperitoneal lymph nodes. Mild increased size of asymmetric soft tissue density involving the left vaginal cuff.  PET scan 07/19/2017-no suspicious hypermetabolic activity within the neck, chest, abdomen or pelvis. Specifically no evidence of residual hypermetabolic tumor in the surgical bed or at the vaginal cuff. Hypermetabolic activity in the lumbar spine at the level of the right L3-4 facet joint appears degenerative.  MRI abdomen 12/02/2017-focal area of abnormal signal in the caudate lobe of the liver-unclear etiology, dilation of the hepaticojejunostomy loop, bile ducts, and pancreatic duct-stricture formation versus  recurrent tumor  8. History of atrial fibrillation-maintained on xarelto  9. Family history of multiple cancers-negative CancerNext gene panel  10. Prolonged nausea following the pancreaticoduodenectomy. Improved 10/26/2014.  11. Port-A-Cath placement 10/21/2014.  12. History of Neutropenia secondary to chemotherapy   13. Diarrhea. Question pancreatic insufficiency. Pancreatic enzyme replacement initiated 01/05/2015. Recurrent diarrhea following a course of antibiotics March 2017.  14. History of positional vertigo-resolved  15. 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 Neutropenia secondary to chemotherapy 09/18/2016.  17. Delayed nausea and diarrhea following FOLFOX. Emend added with cycle 2. Decadron prophylaxis added with cycle 3  18. Diarrhea 10/28/2016. Question related to chemotherapy. Negative C. difficile testing 10/31/2016.  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  20.Admission 12/02/2017 with Bacteroides bacteremia  Biliary obstruction documented on CT/MRI 12/02/2017  Upper endoscopy 12/05/2017 revealed angulation of the apparent limb precluding intubation  Status post placement of a biliary drain 12/05/2017, replaced 12/09/2017  Bile duct brushings 12/09/2017-negative for malignancy  Biliary drain cholangiogram 12/18/2017-there is patency of the biliary-enteric anastomosis with no contrast traverses the afferentlimb into the jejunum  Jejunal stent placed 01/28/2018, brush biopsy negative for malignancy  Biliary drain removed 02/11/2018  21.Admission 12/29/2017 through 01/02/2018 with sepsis secondary to Klebsiella bacteremia/cholangitis. Cholangiogram 12/30/2017 showed high-grade stenosis/stricture of the proximal draining jejunal loops.Internal/external biliary drainage catheter  placed.  22.Right pelvicaliectasis on the PET scan 01/16/2018 with perinephric soft tissue fullness, the right ureter is not dilated    Disposition: [name] appears unchanged.  Dr. [name] reviewed the CT scan from yesterday with [name] and her family.  They understand there is evidence of progression of the pancreas cancer.  Dr. [name] discussed treatment with gemcitabine/Abraxane on a 2-week schedule.  We reviewed potential toxicities including bone marrow toxicity, nausea, hair loss, allergic reaction.  We reviewed potential toxicities associated with gemcitabine including fever, rash, pneumonitis, elevation of transaminases.  We reviewed the neuropathy associated with Abraxane.  She would like to proceed.  She will return for cycle 1  gemcitabine/Abraxane on 04/07/2018.  We will see her prior to cycle 2 on 04/21/2018.  She will contact the office in the interim with any problems.  Patient seen with Dr. [name].  25 minutes were spent face-to-face at today's visit with the majority of that time involved in counseling/coordination of care.    Ned Card ANP/GNP-BC   04/01/2018  12:44 PM This was a shared visit with Ned Card.  I reviewed the restaging CT images.  I discussed the findings with [name] and her family.  She has clinical and CT evidence of disease progression.  We will refer her to urology to consider the indication for placement of a right ureter stent.  We discussed treatment options for the pancreas cancer.  She understands no therapy will be curative.  We discussed supportive care versus a trial of gemcitabine/Abraxane.  She would like to proceed with chemotherapy.  We reviewed the potential toxicities associated with the gemcitabine/Abraxane regimen.  Julieanne Manson, MD

## 2018-04-01 NOTE — Telephone Encounter (Signed)
Printed avs and calender of upcoming appointment. Per 7/10 los. Unfortunately I [name]) was unable to keep appointments from being split due avaliability.

## 2018-04-02 ENCOUNTER — Other Ambulatory Visit: Payer: Self-pay | Admitting: Cardiology

## 2018-04-02 ENCOUNTER — Other Ambulatory Visit: Payer: Self-pay | Admitting: Nurse Practitioner

## 2018-04-02 DIAGNOSIS — C250 Malignant neoplasm of head of pancreas: Secondary | ICD-10-CM

## 2018-04-03 ENCOUNTER — Other Ambulatory Visit: Payer: Self-pay | Admitting: Cardiology

## 2018-04-05 ENCOUNTER — Other Ambulatory Visit: Payer: Self-pay | Admitting: Oncology

## 2018-04-07 ENCOUNTER — Inpatient Hospital Stay: Payer: Medicare Other

## 2018-04-07 DIAGNOSIS — Z95828 Presence of other vascular implants and grafts: Secondary | ICD-10-CM

## 2018-04-07 DIAGNOSIS — C541 Malignant neoplasm of endometrium: Secondary | ICD-10-CM

## 2018-04-07 DIAGNOSIS — C250 Malignant neoplasm of head of pancreas: Secondary | ICD-10-CM

## 2018-04-07 DIAGNOSIS — Z5111 Encounter for antineoplastic chemotherapy: Secondary | ICD-10-CM | POA: Diagnosis not present

## 2018-04-07 LAB — CBC WITH DIFFERENTIAL (CANCER CENTER ONLY)
BASOS ABS: 0.1 10*3/uL (ref 0.0–0.1)
Basophils Relative: 1 %
Eosinophils Absolute: 0.2 10*3/uL (ref 0.0–0.5)
Eosinophils Relative: 5 %
HEMATOCRIT: 27.8 % — AB (ref 34.8–46.6)
Hemoglobin: 9.4 g/dL — ABNORMAL LOW (ref 11.6–15.9)
LYMPHS ABS: 1.4 10*3/uL (ref 0.9–3.3)
LYMPHS PCT: 31 %
MCH: 29.9 pg (ref 25.1–34.0)
MCHC: 34 g/dL (ref 31.5–36.0)
MCV: 88 fL (ref 79.5–101.0)
MONO ABS: 0.4 10*3/uL (ref 0.1–0.9)
Monocytes Relative: 8 %
NEUTROS ABS: 2.5 10*3/uL (ref 1.5–6.5)
Neutrophils Relative %: 55 %
Platelet Count: 238 10*3/uL (ref 145–400)
RBC: 3.16 MIL/uL — AB (ref 3.70–5.45)
RDW: 14.7 % — ABNORMAL HIGH (ref 11.2–14.5)
WBC: 4.6 10*3/uL (ref 3.9–10.3)

## 2018-04-07 LAB — CMP (CANCER CENTER ONLY)
ALT: 55 U/L — ABNORMAL HIGH (ref 0–44)
AST: 76 U/L — ABNORMAL HIGH (ref 15–41)
Albumin: 3.3 g/dL — ABNORMAL LOW (ref 3.5–5.0)
Alkaline Phosphatase: 817 U/L — ABNORMAL HIGH (ref 38–126)
Anion gap: 6 (ref 5–15)
BUN: 17 mg/dL (ref 8–23)
CO2: 26 mmol/L (ref 22–32)
Calcium: 9 mg/dL (ref 8.9–10.3)
Chloride: 101 mmol/L (ref 98–111)
Creatinine: 0.87 mg/dL (ref 0.44–1.00)
GLUCOSE: 100 mg/dL — AB (ref 70–99)
POTASSIUM: 4.4 mmol/L (ref 3.5–5.1)
Sodium: 133 mmol/L — ABNORMAL LOW (ref 135–145)
Total Bilirubin: 0.6 mg/dL (ref 0.3–1.2)
Total Protein: 7 g/dL (ref 6.5–8.1)

## 2018-04-07 MED ORDER — SODIUM CHLORIDE 0.9 % IJ SOLN
10.0000 mL | INTRAMUSCULAR | Status: DC | PRN
  Administered 2018-04-07: 10 mL via INTRAVENOUS
  Filled 2018-04-07: qty 10

## 2018-04-07 MED ORDER — HEPARIN SOD (PORK) LOCK FLUSH 100 UNIT/ML IV SOLN
500.0000 [IU] | Freq: Once | INTRAVENOUS | Status: AC | PRN
  Administered 2018-04-07: 500 [IU] via INTRAVENOUS
  Filled 2018-04-07: qty 5

## 2018-04-08 ENCOUNTER — Inpatient Hospital Stay: Payer: Medicare Other

## 2018-04-08 VITALS — BP 107/71 | HR 50 | Temp 98.4°F | Resp 18

## 2018-04-08 DIAGNOSIS — C259 Malignant neoplasm of pancreas, unspecified: Secondary | ICD-10-CM

## 2018-04-08 DIAGNOSIS — Z5111 Encounter for antineoplastic chemotherapy: Secondary | ICD-10-CM | POA: Diagnosis not present

## 2018-04-08 LAB — CANCER ANTIGEN 19-9: CA 19-9: 1003 U/mL — ABNORMAL HIGH (ref 0–35)

## 2018-04-08 MED ORDER — PROCHLORPERAZINE MALEATE 10 MG PO TABS
10.0000 mg | ORAL_TABLET | Freq: Once | ORAL | Status: AC
  Administered 2018-04-08: 10 mg via ORAL

## 2018-04-08 MED ORDER — HEPARIN SOD (PORK) LOCK FLUSH 100 UNIT/ML IV SOLN
500.0000 [IU] | Freq: Once | INTRAVENOUS | Status: AC | PRN
  Administered 2018-04-08: 500 [IU]
  Filled 2018-04-08: qty 5

## 2018-04-08 MED ORDER — SODIUM CHLORIDE 0.9% FLUSH
10.0000 mL | INTRAVENOUS | Status: DC | PRN
  Administered 2018-04-08: 10 mL
  Filled 2018-04-08: qty 10

## 2018-04-08 MED ORDER — SODIUM CHLORIDE 0.9 % IV SOLN
Freq: Once | INTRAVENOUS | Status: AC
  Administered 2018-04-08: 09:00:00 via INTRAVENOUS

## 2018-04-08 MED ORDER — PACLITAXEL PROTEIN-BOUND CHEMO INJECTION 100 MG
100.0000 mg/m2 | Freq: Once | Status: AC
  Administered 2018-04-08: 150 mg via INTRAVENOUS
  Filled 2018-04-08: qty 30

## 2018-04-08 MED ORDER — GEMCITABINE HCL CHEMO INJECTION 1 GM/26.3ML
800.0000 mg/m2 | Freq: Once | INTRAVENOUS | Status: AC
  Administered 2018-04-08: 1292 mg via INTRAVENOUS
  Filled 2018-04-08: qty 33.98

## 2018-04-08 MED ORDER — PROCHLORPERAZINE MALEATE 10 MG PO TABS
ORAL_TABLET | ORAL | Status: AC
  Filled 2018-04-08: qty 1

## 2018-04-08 NOTE — Patient Instructions (Signed)
[facility] Discharge Instructions for Patients Receiving Chemotherapy  Today you received the following chemotherapy agents Gemzar and Abraxane  To help prevent nausea and vomiting after your treatment, we encourage you to take your nausea medication as directed   If you develop nausea and vomiting that is not controlled by your nausea medication, call the clinic.   BELOW ARE SYMPTOMS THAT SHOULD BE REPORTED IMMEDIATELY:  *FEVER GREATER THAN 100.5 F  *CHILLS WITH OR WITHOUT FEVER  NAUSEA AND VOMITING THAT IS NOT CONTROLLED WITH YOUR NAUSEA MEDICATION  *UNUSUAL SHORTNESS OF BREATH  *UNUSUAL BRUISING OR BLEEDING  TENDERNESS IN MOUTH AND THROAT WITH OR WITHOUT PRESENCE OF ULCERS  *URINARY PROBLEMS  *BOWEL PROBLEMS  UNUSUAL RASH Items with * indicate a potential emergency and should be followed up as soon as possible.  Feel free to call the clinic should you have any questions or concerns. The clinic phone number is [phone].  Please show the [facility] at check-in to the Emergency Department and triage nurse.    Gemcitabine (Gemzar) injection What is this medicine? GEMCITABINE (jem SIT a been) is a chemotherapy drug. This medicine is used to treat many types of cancer like breast cancer, lung cancer, pancreatic cancer, and ovarian cancer. This medicine may be used for other purposes; ask your health care provider or pharmacist if you have questions. COMMON BRAND NAME(S): Gemzar What should I tell my health care provider before I take this medicine? They need to know if you have any of these conditions: -blood disorders -infection -kidney disease -liver disease -recent or ongoing radiation therapy -an unusual or allergic reaction to gemcitabine, other chemotherapy, other medicines, foods, dyes, or preservatives -pregnant or trying to get pregnant -breast-feeding How should I use this medicine? This drug is given as an infusion into a vein. It  is administered in a hospital or clinic by a specially trained health care professional. Talk to your pediatrician regarding the use of this medicine in children. Special care may be needed. Overdosage: If you think you have taken too much of this medicine contact a poison control center or emergency room at once. NOTE: This medicine is only for you. Do not share this medicine with others. What if I miss a dose? It is important not to miss your dose. Call your doctor or health care professional if you are unable to keep an appointment. What may interact with this medicine? -medicines to increase blood counts like filgrastim, pegfilgrastim, sargramostim -some other chemotherapy drugs like cisplatin -vaccines Talk to your doctor or health care professional before taking any of these medicines: -acetaminophen -aspirin -ibuprofen -ketoprofen -naproxen This list may not describe all possible interactions. Give your health care provider a list of all the medicines, herbs, non-prescription drugs, or dietary supplements you use. Also tell them if you smoke, drink alcohol, or use illegal drugs. Some items may interact with your medicine. What should I watch for while using this medicine? Visit your doctor for checks on your progress. This drug may make you feel generally unwell. This is not uncommon, as chemotherapy can affect healthy cells as well as cancer cells. Report any side effects. Continue your course of treatment even though you feel ill unless your doctor tells you to stop. In some cases, you may be given additional medicines to help with side effects. Follow all directions for their use. Call your doctor or health care professional for advice if you get a fever, chills or sore throat, or other  symptoms of a cold or flu. Do not treat yourself. This drug decreases your body's ability to fight infections. Try to avoid being around people who are sick. This medicine may increase your risk to  bruise or bleed. Call your doctor or health care professional if you notice any unusual bleeding. Be careful brushing and flossing your teeth or using a toothpick because you may get an infection or bleed more easily. If you have any dental work done, tell your dentist you are receiving this medicine. Avoid taking products that contain aspirin, acetaminophen, ibuprofen, naproxen, or ketoprofen unless instructed by your doctor. These medicines may hide a fever. Women should inform their doctor if they wish to become pregnant or think they might be pregnant. There is a potential for serious side effects to an unborn child. Talk to your health care professional or pharmacist for more information. Do not breast-feed an infant while taking this medicine. What side effects may I notice from receiving this medicine? Side effects that you should report to your doctor or health care professional as soon as possible: -allergic reactions like skin rash, itching or hives, swelling of the face, lips, or tongue -low blood counts - this medicine may decrease the number of white blood cells, red blood cells and platelets. You may be at increased risk for infections and bleeding. -signs of infection - fever or chills, cough, sore throat, pain or difficulty passing urine -signs of decreased platelets or bleeding - bruising, pinpoint red spots on the skin, black, tarry stools, blood in the urine -signs of decreased red blood cells - unusually weak or tired, fainting spells, lightheadedness -breathing problems -chest pain -mouth sores -nausea and vomiting -pain, swelling, redness at site where injected -pain, tingling, numbness in the hands or feet -stomach pain -swelling of ankles, feet, hands -unusual bleeding Side effects that usually do not require medical attention (report to your doctor or health care professional if they continue or are bothersome): -constipation -diarrhea -hair loss -loss of  appetite -stomach upset This list may not describe all possible side effects. Call your doctor for medical advice about side effects. You may report side effects to FDA at 1-800-FDA-1088. Where should I keep my medicine? This drug is given in a hospital or clinic and will not be stored at home. NOTE: This sheet is a summary. It may not cover all possible information. If you have questions about this medicine, talk to your doctor, pharmacist, or health care provider.  2018 Elsevier/Gold Standard (2008-01-19 18:45:54)    Nanoparticle Albumin-Bound Paclitaxel (Abraxane) injection What is this medicine? NANOPARTICLE ALBUMIN-BOUND PACLITAXEL (Na no PAHR ti kuhl al BYOO muhn-bound PAK li TAX el) is a chemotherapy drug. It targets fast dividing cells, like cancer cells, and causes these cells to die. This medicine is used to treat advanced breast cancer and advanced lung cancer. This medicine may be used for other purposes; ask your health care provider or pharmacist if you have questions. COMMON BRAND NAME(S): Abraxane What should I tell my health care provider before I take this medicine? They need to know if you have any of these conditions: -kidney disease -liver disease -low blood counts, like low platelets, red blood cells, or white blood cells -recent or ongoing radiation therapy -an unusual or allergic reaction to paclitaxel, albumin, other chemotherapy, other medicines, foods, dyes, or preservatives -pregnant or trying to get pregnant -breast-feeding How should I use this medicine? This drug is given as an infusion into a vein. It is administered in  a hospital or clinic by a specially trained health care professional. Talk to your pediatrician regarding the use of this medicine in children. Special care may be needed. Overdosage: If you think you have taken too much of this medicine contact a poison control center or emergency room at once. NOTE: This medicine is only for you. Do not  share this medicine with others. What if I miss a dose? It is important not to miss your dose. Call your doctor or health care professional if you are unable to keep an appointment. What may interact with this medicine? -cyclosporine -diazepam -ketoconazole -medicines to increase blood counts like filgrastim, pegfilgrastim, sargramostim -other chemotherapy drugs like cisplatin, doxorubicin, epirubicin, etoposide, teniposide, vincristine -quinidine -testosterone -vaccines -verapamil Talk to your doctor or health care professional before taking any of these medicines: -acetaminophen -aspirin -ibuprofen -ketoprofen -naproxen This list may not describe all possible interactions. Give your health care provider a list of all the medicines, herbs, non-prescription drugs, or dietary supplements you use. Also tell them if you smoke, drink alcohol, or use illegal drugs. Some items may interact with your medicine. What should I watch for while using this medicine? Your condition will be monitored carefully while you are receiving this medicine. You will need important blood work done while you are taking this medicine. This medicine can cause serious allergic reactions. If you experience allergic reactions like skin rash, itching or hives, swelling of the face, lips, or tongue, tell your doctor or health care professional right away. In some cases, you may be given additional medicines to help with side effects. Follow all directions for their use. This drug may make you feel generally unwell. This is not uncommon, as chemotherapy can affect healthy cells as well as cancer cells. Report any side effects. Continue your course of treatment even though you feel ill unless your doctor tells you to stop. Call your doctor or health care professional for advice if you get a fever, chills or sore throat, or other symptoms of a cold or flu. Do not treat yourself. This drug decreases your body's ability to fight  infections. Try to avoid being around people who are sick. This medicine may increase your risk to bruise or bleed. Call your doctor or health care professional if you notice any unusual bleeding. Be careful brushing and flossing your teeth or using a toothpick because you may get an infection or bleed more easily. If you have any dental work done, tell your dentist you are receiving this medicine. Avoid taking products that contain aspirin, acetaminophen, ibuprofen, naproxen, or ketoprofen unless instructed by your doctor. These medicines may hide a fever. Do not become pregnant while taking this medicine. Women should inform their doctor if they wish to become pregnant or think they might be pregnant. There is a potential for serious side effects to an unborn child. Talk to your health care professional or pharmacist for more information. Do not breast-feed an infant while taking this medicine. Men are advised not to father a child while receiving this medicine. What side effects may I notice from receiving this medicine? Side effects that you should report to your doctor or health care professional as soon as possible: -allergic reactions like skin rash, itching or hives, swelling of the face, lips, or tongue -low blood counts - This drug may decrease the number of white blood cells, red blood cells and platelets. You may be at increased risk for infections and bleeding. -signs of infection - fever or chills,  cough, sore throat, pain or difficulty passing urine -signs of decreased platelets or bleeding - bruising, pinpoint red spots on the skin, black, tarry stools, nosebleeds -signs of decreased red blood cells - unusually weak or tired, fainting spells, lightheadedness -breathing problems -changes in vision -chest pain -high or low blood pressure -mouth sores -nausea and vomiting -pain, swelling, redness or irritation at the injection site -pain, tingling, numbness in the hands or  feet -slow or irregular heartbeat -swelling of the ankle, feet, hands Side effects that usually do not require medical attention (report to your doctor or health care professional if they continue or are bothersome): -aches, pains -changes in the color of fingernails -diarrhea -hair loss -loss of appetite This list may not describe all possible side effects. Call your doctor for medical advice about side effects. You may report side effects to FDA at 1-800-FDA-1088. Where should I keep my medicine? This drug is given in a hospital or clinic and will not be stored at home. NOTE: This sheet is a summary. It may not cover all possible information. If you have questions about this medicine, talk to your doctor, pharmacist, or health care provider.  2018 Elsevier/Gold Standard (2015-07-12 10:05:20)

## 2018-04-17 ENCOUNTER — Other Ambulatory Visit: Payer: Self-pay | Admitting: Family Medicine

## 2018-04-17 DIAGNOSIS — Z1231 Encounter for screening mammogram for malignant neoplasm of breast: Secondary | ICD-10-CM

## 2018-04-18 ENCOUNTER — Other Ambulatory Visit: Payer: Self-pay | Admitting: Oncology

## 2018-04-21 ENCOUNTER — Inpatient Hospital Stay: Payer: Medicare Other

## 2018-04-21 ENCOUNTER — Inpatient Hospital Stay: Payer: Medicare Other | Admitting: Oncology

## 2018-04-21 ENCOUNTER — Telehealth: Payer: Self-pay | Admitting: Oncology

## 2018-04-21 VITALS — BP 143/87 | HR 58 | Temp 97.8°F | Resp 17 | Ht 62.0 in | Wt 128.3 lb

## 2018-04-21 DIAGNOSIS — C259 Malignant neoplasm of pancreas, unspecified: Secondary | ICD-10-CM

## 2018-04-21 DIAGNOSIS — C250 Malignant neoplasm of head of pancreas: Secondary | ICD-10-CM | POA: Diagnosis not present

## 2018-04-21 DIAGNOSIS — Z8542 Personal history of malignant neoplasm of other parts of uterus: Secondary | ICD-10-CM | POA: Diagnosis not present

## 2018-04-21 DIAGNOSIS — N139 Obstructive and reflux uropathy, unspecified: Secondary | ICD-10-CM

## 2018-04-21 DIAGNOSIS — Z95828 Presence of other vascular implants and grafts: Secondary | ICD-10-CM

## 2018-04-21 DIAGNOSIS — C541 Malignant neoplasm of endometrium: Secondary | ICD-10-CM

## 2018-04-21 DIAGNOSIS — Z5111 Encounter for antineoplastic chemotherapy: Secondary | ICD-10-CM | POA: Diagnosis not present

## 2018-04-21 DIAGNOSIS — D701 Agranulocytosis secondary to cancer chemotherapy: Secondary | ICD-10-CM

## 2018-04-21 LAB — CBC WITH DIFFERENTIAL (CANCER CENTER ONLY)
BASOS ABS: 0 10*3/uL (ref 0.0–0.1)
Basophils Relative: 0 %
Eosinophils Absolute: 0.3 10*3/uL (ref 0.0–0.5)
Eosinophils Relative: 9 %
HCT: 27.3 % — ABNORMAL LOW (ref 34.8–46.6)
HEMOGLOBIN: 8.8 g/dL — AB (ref 11.6–15.9)
Lymphocytes Relative: 36 %
Lymphs Abs: 1.1 10*3/uL (ref 0.9–3.3)
MCH: 28.3 pg (ref 25.1–34.0)
MCHC: 32.2 g/dL (ref 31.5–36.0)
MCV: 87.8 fL (ref 79.5–101.0)
Monocytes Absolute: 0.3 10*3/uL (ref 0.1–0.9)
Monocytes Relative: 11 %
Neutro Abs: 1.3 10*3/uL — ABNORMAL LOW (ref 1.5–6.5)
Neutrophils Relative %: 44 %
Platelet Count: 263 10*3/uL (ref 145–400)
RBC: 3.11 MIL/uL — ABNORMAL LOW (ref 3.70–5.45)
RDW: 14.3 % (ref 11.2–14.5)
WBC Count: 2.9 10*3/uL — ABNORMAL LOW (ref 3.9–10.3)

## 2018-04-21 LAB — CMP (CANCER CENTER ONLY)
ALK PHOS: 453 U/L — AB (ref 38–126)
ALT: 32 U/L (ref 0–44)
AST: 49 U/L — AB (ref 15–41)
Albumin: 3.3 g/dL — ABNORMAL LOW (ref 3.5–5.0)
Anion gap: 7 (ref 5–15)
BILIRUBIN TOTAL: 0.4 mg/dL (ref 0.3–1.2)
BUN: 16 mg/dL (ref 8–23)
CALCIUM: 9 mg/dL (ref 8.9–10.3)
CO2: 24 mmol/L (ref 22–32)
CREATININE: 0.92 mg/dL (ref 0.44–1.00)
Chloride: 102 mmol/L (ref 98–111)
GFR, Est AFR Am: >60 mL/min (ref >60)
GLUCOSE: 86 mg/dL (ref 70–99)
Potassium: 4.2 mmol/L (ref 3.5–5.1)
Sodium: 133 mmol/L — ABNORMAL LOW (ref 135–145)
Total Protein: 7.1 g/dL (ref 6.5–8.1)

## 2018-04-21 MED ORDER — HEPARIN SOD (PORK) LOCK FLUSH 100 UNIT/ML IV SOLN
500.0000 [IU] | Freq: Once | INTRAVENOUS | Status: AC | PRN
  Administered 2018-04-21: 500 [IU] via INTRAVENOUS
  Filled 2018-04-21: qty 5

## 2018-04-21 MED ORDER — SODIUM CHLORIDE 0.9 % IJ SOLN
10.0000 mL | INTRAMUSCULAR | Status: DC | PRN
  Administered 2018-04-21: 10 mL via INTRAVENOUS
  Filled 2018-04-21: qty 10

## 2018-04-21 MED ORDER — HYDROCODONE-ACETAMINOPHEN 5-325 MG PO TABS: 1.0000 | ORAL_TABLET | ORAL | 0 refills | Status: DC | PRN

## 2018-04-21 MED ORDER — LIDOCAINE-PRILOCAINE 2.5-2.5 % EX CREA: TOPICAL_CREAM | CUTANEOUS | 99 refills | Status: DC

## 2018-04-21 NOTE — Progress Notes (Signed)
[facility] OFFICE PROGRESS NOTE   Diagnosis: Pancreas cancer  INTERVAL HISTORY:   [name] returns as scheduled.  She completed a cycle of gemcitabine/Abraxane on 04/08/2018.  No fever, rash, or change in neuropathy symptoms.  She had pain at the Port-A-Cath site on the day of treatment.  This has resolved.  She continues to have pain at the upper abdomen and back.  The pain is not relieved with hydrocodone.  Objective:  Vital signs in last 24 hours:  Blood pressure (!) 143/87, pulse (!) 58, temperature 97.8 F (36.6 C), temperature source Oral, resp. rate 17, height 5\' 2"  (1.575 m), weight 128 lb 4.8 oz (58.2 kg), SpO2 100 %.    HEENT: No thrush or ulcers Resp: Lungs clear bilaterally Cardio: Regular rate and rhythm GI: Tender in the upper abdomen, no mass, no hepatomegaly Vascular: No leg edema  Portacath/PICC-without erythema  Lab Results:  Lab Results  Component Value Date   WBC 2.9 (L) 04/21/2018   HGB 8.8 (L) 04/21/2018   HCT 27.3 (L) 04/21/2018   MCV 87.8 04/21/2018   PLT 263 04/21/2018   NEUTROABS 1.3 (L) 04/21/2018    CMP  Lab Results  Component Value Date   NA 133 (L) 04/21/2018   K 4.2 04/21/2018   CL 102 04/21/2018   CO2 24 04/21/2018   GLUCOSE 86 04/21/2018   BUN 16 04/21/2018   CREATININE 0.92 04/21/2018   CALCIUM 9.0 04/21/2018   PROT 7.1 04/21/2018   ALBUMIN 3.3 (L) 04/21/2018   AST 49 (H) 04/21/2018   ALT 32 04/21/2018   ALKPHOS 453 (H) 04/21/2018   BILITOT 0.4 04/21/2018   GFRNONAA >60 04/21/2018   GFRAA >60 04/21/2018     Medications: I have reviewed the patient's current medications.   Assessment/Plan: 1. 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  Gemcitabine held 11/02/2014 due to neutropenia.  Gemcitabine 11/09/2014 dose reduced 800 mg/m.  Gemcitabine held 11/16/2014 due to neutropenia.  Gemcitabine resumed 11/23/2014 every 2 week schedule.  Cycle 6 gemcitabine 01/05/2015  Cycle 7 gemcitabine 01/18/2015  Cycle 8 gemcitabine 02/01/2015  Cycle 9 gemcitabine 02/15/2015  Cycle 10 gemcitabine 03/01/2015  Cycle 11 gemcitabine 03/15/2015  Cycle 12 gemcitabine 03/29/2015  Elevated CA 19-01 May 2016  CTs 06/17/2016-new soft tissue mass at the root of the mesentery with vascular involvement  PET 06/27/2016-hypermetabolic activity associated with soft tissue adjacent to surgical clips in the central mesentery  Status post SBRTto the mesenteric mass completed 07/26/2016  CT abdomen/pelvis 08/23/2016 -mesenteric mass stable to slightly decreased in size.  Cycle 1 FOLFOX 09/03/2016  Cycle 2 FOLFOX 09/30/2016  Cycle 3 FOLFOX 10/14/2016   Cycle 4 FOLFOX 11/05/2016 (5-FU bolus eliminated and 5-FU pump dose reduced)  Cycle 5 FOLFOX 11/19/2016  CT abdomen/pelvis 11/28/2016-decreased size of soft tissue at the small bowel mesentery, mild asymmetric soft tissue at the left vaginal cuff  Cycle 6 FOLFOX 12/10/2016 (5-FU infusion further reduced and oxaliplatin reduced)  Cycle 7 FOLFOX 12/31/2016  Cycle 8 FOLFOX 01/21/2017   CT 02/24/2017-slight decrease in size of the mesenteric mass, resolution of soft tissue fullness at the left vaginal cuff, no evidence of disease progression  CT abdomen/pelvis 06/20/2017-stable ill-defined soft tissue at the mesenteric root, mild  increased asymmetry at the left vaginal cuff, no other evidence of disease progression  Elevated CA 19-9 09/11/2018  CT abdomen/pelvis 11/18/2017-no evidence of recurrent pancreas cancer, dilated afferentloop, intrahepatic biliary dilatation  PET scan 01/16/2018-low level  hypermetabolic activity in the porta hepatis which appears to correspond with a mildly enlarged lymph node on MRI. No other suspicious nodal activity in the abdomen. Small right paratracheal and subcarinal lymph nodes with low-level hypermetabolic activity in the chest. No abnormal activity within the liver. Findings suspicious for obstruction at the right ureteropelvic junction. Asymmetric left oropharyngeal activity.  CT abdomen/pelvis 03/31/2018- significant increase in right-sided retroperitoneal soft tissue which invades and encases the IVC, celiac trunk, SMA, duodenum and right renal pelvis.  Interval progression of intrahepatic and common bile duct dilatation.  New right-sided obstructive uropathy secondary to tumor invasion of the right renal pelvis.  Cycle 1 gemcitabine/Abraxane 04/08/2018  Cycle 2 gemcitabine/Abraxane 04/22/2018   2. bile duct obstruction secondary to #1, status post an ERCP with stent placement 09/23/2015hypermetabolic soft tissue in the central mesentery, no other evidence of metastatic disease, stable mildly enlarged portal caval node  3. Admission with post ERCP pancreatitis 06/16/2014  4. History of abdominal pain secondary to #1  5. Pulmonary embolism diagnosed on a CT of the abdomen 09/16/2014  Negative lower extremity Dopplers 09/17/2014  6. Multiple orthopedic surgical procedures  7. Endometrial cancer,stage IA, grade 1 endometrioid adenocarcinoma, 18% myometrial invasion, no lymphovascular space involvement, negative washings  Status post robotic total hysterectomy and bilateral salpingo-oophorectomy 11/30/2010  Recurrent tumor left lateral vagina status post biopsy 11/24/2014 with pathology confirming adenocarcinoma with focal squamous differentiation consistent with endometrial adenocarcinoma  Staging CT scans 12/06/2014 with no evidence of local pancreatic cancer recurrence. Small fluid collection adjacent to the left adrenal  gland. Severe hepatic steatosis. No evidence of local extension of endometrial carcinoma. Carcinoma not well-defined at the vaginal cuff. 5 mm right external iliac lymph node. 3.6 mm left external iliac lymph node  Brachytherapy initiated 12/22/2014, completed 01/19/2015  CT abdomen/pelvis 07/24/2015 revealed a 3 x 4 cm soft tissue focus at the vaginal apex  PET scan 08/11/2015 revealed no mass at the vaginal apex and no evidence of metastatic disease  CT 11/28/2016-mild asymmetric soft tissue at the left vaginal cuff, resolved on CT 02/24/2017  CT abdomen/pelvis 06/20/2017-stable mild ill-defined soft tissue density in the mesenteric root. Stable mild portacaval lymphadenopathy and subcentimeter right retroperitoneal lymph nodes. Mild increased size of asymmetric soft tissue density involving the left vaginal cuff.  PET scan 07/19/2017-no suspicious hypermetabolic activity within the neck, chest, abdomen or pelvis. Specifically no evidence of residual hypermetabolic tumor in the surgical bed or at the vaginal cuff. Hypermetabolic activity in the lumbar spine at the level of the right L3-4 facet joint appears degenerative.  MRI abdomen 12/02/2017-focal area of abnormal signal in the caudate lobe of the liver-unclear etiology, dilation of the hepaticojejunostomy loop, bile ducts, and pancreatic duct-stricture formation versus recurrent tumor  8. History of atrial fibrillation-maintained on xarelto  9. Family history of multiple cancers-negative CancerNext gene panel  10. Prolonged nausea following the pancreaticoduodenectomy. Improved 10/26/2014.  11. Port-A-Cath placement 10/21/2014.  12. History of Neutropenia secondary to chemotherapy   13. Diarrhea. Question pancreatic insufficiency. Pancreatic enzyme replacement initiated 01/05/2015. Recurrent diarrhea following a course of antibiotics March 2017.  14. History of positional vertigo-resolved  15. 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  Neutropenia secondary to chemotherapy, G-CSF will be added with cycle 2 gemcitabine/Abraxane  17. Delayed nausea and diarrhea following FOLFOX. Emend added with cycle 2. Decadron prophylaxis added with cycle 3  18. Diarrhea 10/28/2016. Question related to chemotherapy. Negative C. difficile testing 10/31/2016.  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  20.Admission 12/02/2017 with Bacteroides bacteremia  Biliary obstruction documented on CT/MRI 12/02/2017  Upper endoscopy 12/05/2017 revealed angulation of the apparent limb precluding intubation  Status post placement of a biliary drain 12/05/2017, replaced 12/09/2017  Bile duct brushings 12/09/2017-negative for malignancy  Biliary drain cholangiogram 12/18/2017-there is patency of the biliary-enteric anastomosis with no contrast traverses the afferentlimb into the jejunum  Jejunal stent placed 01/28/2018, brush biopsy negative for malignancy  Biliary drain removed 02/11/2018  21.Admission 12/29/2017 through 01/02/2018 with sepsis secondary to Klebsiella bacteremia/cholangitis. 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  22.Right pelvicaliectasis on the PET scan 01/16/2018 with perinephric soft tissue fullness, the right ureter is not dilated    Disposition: [name] has completed 1 treatment with gemcitabine/Abraxane.  She tolerated the chemotherapy well.  She continues to have abdomen/back pain.  She will try MSIR for pain.  She will contact us if the MSIR does not relieve her pain.  She has mild neutropenia today.  G-CSF will be added to the chemotherapy regimen.  She will return for an office visit and chemotherapy in 2 weeks.  The plan is  to complete 4-5 cycles of gemcitabine/Abraxane prior to a restaging evaluation.    Savannah Coder, [name]  04/21/2018  8:59 AM

## 2018-04-21 NOTE — Telephone Encounter (Signed)
Appointments scheduled AVS/Calendar printed per 7/30 los °

## 2018-04-22 ENCOUNTER — Inpatient Hospital Stay: Payer: Medicare Other

## 2018-04-22 ENCOUNTER — Telehealth: Payer: Self-pay

## 2018-04-22 ENCOUNTER — Ambulatory Visit: Payer: Medicare Other

## 2018-04-22 VITALS — BP 122/69 | HR 55 | Temp 97.8°F | Resp 16

## 2018-04-22 DIAGNOSIS — Z5111 Encounter for antineoplastic chemotherapy: Secondary | ICD-10-CM | POA: Diagnosis not present

## 2018-04-22 DIAGNOSIS — C259 Malignant neoplasm of pancreas, unspecified: Secondary | ICD-10-CM

## 2018-04-22 LAB — CBC WITH DIFFERENTIAL (CANCER CENTER ONLY)
BASOS ABS: 0 10*3/uL (ref 0.0–0.1)
Basophils Relative: 1 %
Eosinophils Absolute: 0.2 10*3/uL (ref 0.0–0.5)
Eosinophils Relative: 7 %
HEMATOCRIT: 26.2 % — AB (ref 34.8–46.6)
Hemoglobin: 8.6 g/dL — ABNORMAL LOW (ref 11.6–15.9)
LYMPHS PCT: 36 %
Lymphs Abs: 1.1 10*3/uL (ref 0.9–3.3)
MCH: 29 pg (ref 25.1–34.0)
MCHC: 32.8 g/dL (ref 31.5–36.0)
MCV: 88.2 fL (ref 79.5–101.0)
Monocytes Absolute: 0.4 10*3/uL (ref 0.1–0.9)
Monocytes Relative: 12 %
NEUTROS ABS: 1.3 10*3/uL — AB (ref 1.5–6.5)
Neutrophils Relative %: 44 %
Platelet Count: 292 10*3/uL (ref 145–400)
RBC: 2.97 MIL/uL — AB (ref 3.70–5.45)
RDW: 14.4 % (ref 11.2–14.5)
WBC Count: 3 10*3/uL — ABNORMAL LOW (ref 3.9–10.3)

## 2018-04-22 LAB — CANCER ANTIGEN 19-9: CA 19-9: 781 U/mL — ABNORMAL HIGH (ref 0–35)

## 2018-04-22 MED ORDER — SODIUM CHLORIDE 0.9% FLUSH
10.0000 mL | INTRAVENOUS | Status: DC | PRN
  Administered 2018-04-22: 10 mL
  Filled 2018-04-22: qty 10

## 2018-04-22 MED ORDER — PACLITAXEL PROTEIN-BOUND CHEMO INJECTION 100 MG
100.0000 mg/m2 | Freq: Once | Status: AC
  Administered 2018-04-22: 150 mg via INTRAVENOUS
  Filled 2018-04-22: qty 30

## 2018-04-22 MED ORDER — HEPARIN SOD (PORK) LOCK FLUSH 100 UNIT/ML IV SOLN
500.0000 [IU] | Freq: Once | INTRAVENOUS | Status: AC | PRN
  Administered 2018-04-22: 500 [IU]
  Filled 2018-04-22: qty 5

## 2018-04-22 MED ORDER — SODIUM CHLORIDE 0.9 % IV SOLN
800.0000 mg/m2 | Freq: Once | INTRAVENOUS | Status: AC
  Administered 2018-04-22: 1292 mg via INTRAVENOUS
  Filled 2018-04-22: qty 33.98

## 2018-04-22 MED ORDER — PROCHLORPERAZINE MALEATE 10 MG PO TABS
ORAL_TABLET | ORAL | Status: AC
Start: 2018-04-22 — End: ?
  Filled 2018-04-22: qty 1

## 2018-04-22 MED ORDER — PROCHLORPERAZINE MALEATE 10 MG PO TABS
10.0000 mg | ORAL_TABLET | Freq: Once | ORAL | Status: AC
  Administered 2018-04-22: 10 mg via ORAL

## 2018-04-22 MED ORDER — SODIUM CHLORIDE 0.9 % IV SOLN
Freq: Once | INTRAVENOUS | Status: AC
  Administered 2018-04-22: 08:00:00 via INTRAVENOUS
  Filled 2018-04-22: qty 250

## 2018-04-22 NOTE — Patient Instructions (Signed)
[facility] Discharge Instructions for Patients Receiving Chemotherapy  Today you received the following chemotherapy agents: Paclitaxel-protein bound (Abraxane) and Gemcitabine (Gemzar)  To help prevent nausea and vomiting after your treatment, we encourage you to take your nausea medication as prescribed.    If you develop nausea and vomiting that is not controlled by your nausea medication, call the clinic.   BELOW ARE SYMPTOMS THAT SHOULD BE REPORTED IMMEDIATELY:  *FEVER GREATER THAN 100.5 F  *CHILLS WITH OR WITHOUT FEVER  NAUSEA AND VOMITING THAT IS NOT CONTROLLED WITH YOUR NAUSEA MEDICATION  *UNUSUAL SHORTNESS OF BREATH  *UNUSUAL BRUISING OR BLEEDING  TENDERNESS IN MOUTH AND THROAT WITH OR WITHOUT PRESENCE OF ULCERS  *URINARY PROBLEMS  *BOWEL PROBLEMS  UNUSUAL RASH Items with * indicate a potential emergency and should be followed up as soon as possible.  Feel free to call the clinic should you have any questions or concerns. The clinic phone number is [phone].  Please show the [facility] at check-in to the Emergency Department and triage nurse.

## 2018-04-22 NOTE — Telephone Encounter (Signed)
Lab add on per 7/31 [facility] RN request

## 2018-04-22 NOTE — Progress Notes (Signed)
Per Dr. [name]: OK to treat with ANC 1.3

## 2018-04-23 ENCOUNTER — Inpatient Hospital Stay: Payer: Medicare Other | Attending: Oncology

## 2018-04-23 VITALS — BP 135/70 | HR 60 | Temp 98.0°F | Resp 18

## 2018-04-23 DIAGNOSIS — Z7901 Long term (current) use of anticoagulants: Secondary | ICD-10-CM | POA: Diagnosis not present

## 2018-04-23 DIAGNOSIS — R7989 Other specified abnormal findings of blood chemistry: Secondary | ICD-10-CM | POA: Diagnosis not present

## 2018-04-23 DIAGNOSIS — Z8542 Personal history of malignant neoplasm of other parts of uterus: Secondary | ICD-10-CM | POA: Insufficient documentation

## 2018-04-23 DIAGNOSIS — A4159 Other Gram-negative sepsis: Secondary | ICD-10-CM | POA: Diagnosis not present

## 2018-04-23 DIAGNOSIS — D701 Agranulocytosis secondary to cancer chemotherapy: Secondary | ICD-10-CM | POA: Insufficient documentation

## 2018-04-23 DIAGNOSIS — D72823 Leukemoid reaction: Secondary | ICD-10-CM | POA: Diagnosis not present

## 2018-04-23 DIAGNOSIS — R112 Nausea with vomiting, unspecified: Secondary | ICD-10-CM | POA: Insufficient documentation

## 2018-04-23 DIAGNOSIS — C259 Malignant neoplasm of pancreas, unspecified: Secondary | ICD-10-CM

## 2018-04-23 DIAGNOSIS — C250 Malignant neoplasm of head of pancreas: Secondary | ICD-10-CM | POA: Insufficient documentation

## 2018-04-23 DIAGNOSIS — Z5189 Encounter for other specified aftercare: Secondary | ICD-10-CM | POA: Insufficient documentation

## 2018-04-23 DIAGNOSIS — Z5111 Encounter for antineoplastic chemotherapy: Secondary | ICD-10-CM | POA: Insufficient documentation

## 2018-04-23 DIAGNOSIS — I4891 Unspecified atrial fibrillation: Secondary | ICD-10-CM | POA: Diagnosis not present

## 2018-04-23 MED ORDER — PEGFILGRASTIM-CBQV 6 MG/0.6ML ~~LOC~~ SOSY
PREFILLED_SYRINGE | SUBCUTANEOUS | Status: AC
  Filled 2018-04-23: qty 0.6

## 2018-04-23 MED ORDER — PEGFILGRASTIM-CBQV 6 MG/0.6ML ~~LOC~~ SOSY
6.0000 mg | PREFILLED_SYRINGE | Freq: Once | SUBCUTANEOUS | Status: AC
  Administered 2018-04-23: 6 mg via SUBCUTANEOUS

## 2018-04-26 ENCOUNTER — Emergency Department (HOSPITAL_COMMUNITY)
Admission: EM | Admit: 2018-04-26 | Discharge: 2018-04-26 | Disposition: A | Discharge status: Home / Self Care | Payer: Medicare Other | Source: Home / Self Care | Attending: Emergency Medicine | Admitting: Emergency Medicine

## 2018-04-26 ENCOUNTER — Emergency Department (HOSPITAL_COMMUNITY): Payer: Medicare Other

## 2018-04-26 ENCOUNTER — Other Ambulatory Visit: Payer: Self-pay

## 2018-04-26 DIAGNOSIS — I10 Essential (primary) hypertension: Secondary | ICD-10-CM

## 2018-04-26 DIAGNOSIS — Z79899 Other long term (current) drug therapy: Secondary | ICD-10-CM | POA: Insufficient documentation

## 2018-04-26 DIAGNOSIS — I480 Paroxysmal atrial fibrillation: Secondary | ICD-10-CM | POA: Insufficient documentation

## 2018-04-26 DIAGNOSIS — R1013 Epigastric pain: Secondary | ICD-10-CM | POA: Insufficient documentation

## 2018-04-26 DIAGNOSIS — K8309 Other cholangitis: Secondary | ICD-10-CM | POA: Diagnosis not present

## 2018-04-26 LAB — URINALYSIS, ROUTINE W REFLEX MICROSCOPIC
BILIRUBIN URINE: NEGATIVE
GLUCOSE, UA: NEGATIVE mg/dL
Hgb urine dipstick: NEGATIVE
KETONES UR: NEGATIVE mg/dL
Leukocytes, UA: NEGATIVE
Nitrite: NEGATIVE
PROTEIN: NEGATIVE mg/dL
Specific Gravity, Urine: 1.004 — ABNORMAL LOW (ref 1.005–1.030)
pH: 5 (ref 5.0–8.0)

## 2018-04-26 LAB — COMPREHENSIVE METABOLIC PANEL
ALBUMIN: 3.1 g/dL — AB (ref 3.5–5.0)
ALK PHOS: 318 U/L — AB (ref 38–126)
ALT: 29 U/L (ref 0–44)
ANION GAP: 13 (ref 5–15)
AST: 41 U/L (ref 15–41)
BILIRUBIN TOTAL: 0.8 mg/dL (ref 0.3–1.2)
BUN: 15 mg/dL (ref 8–23)
CALCIUM: 8.8 mg/dL — AB (ref 8.9–10.3)
CO2: 19 mmol/L — ABNORMAL LOW (ref 22–32)
Chloride: 98 mmol/L (ref 98–111)
Creatinine, Ser: 1.03 mg/dL — ABNORMAL HIGH (ref 0.44–1.00)
GFR calc Af Amer: >60 mL/min (ref >60)
GFR calc non Af Amer: 53 mL/min — ABNORMAL LOW (ref >60)
GLUCOSE: 92 mg/dL (ref 70–99)
Potassium: 3.9 mmol/L (ref 3.5–5.1)
Sodium: 130 mmol/L — ABNORMAL LOW (ref 135–145)
TOTAL PROTEIN: 6.6 g/dL (ref 6.5–8.1)

## 2018-04-26 LAB — CBC
HCT: 26.1 % — ABNORMAL LOW (ref 36.0–46.0)
Hemoglobin: 8.6 g/dL — ABNORMAL LOW (ref 12.0–15.0)
MCH: 29.7 pg (ref 26.0–34.0)
MCHC: 33 g/dL (ref 30.0–36.0)
MCV: 90 fL (ref 78.0–100.0)
Platelets: 411 10*3/uL — ABNORMAL HIGH (ref 150–400)
RBC: 2.9 MIL/uL — ABNORMAL LOW (ref 3.87–5.11)
RDW: 14.4 % (ref 11.5–15.5)
WBC: 20.8 10*3/uL — ABNORMAL HIGH (ref 4.0–10.5)

## 2018-04-26 LAB — I-STAT TROPONIN, ED: TROPONIN I, POC: 0 ng/mL (ref 0.00–0.08)

## 2018-04-26 LAB — LIPASE, BLOOD: Lipase: 29 U/L (ref 11–51)

## 2018-04-26 MED ORDER — IOHEXOL 300 MG/ML  SOLN
100.0000 mL | Freq: Once | INTRAMUSCULAR | Status: AC | PRN
Start: 2018-04-26 — End: 2018-04-26
  Administered 2018-04-26: 100 mL via INTRAVENOUS

## 2018-04-26 MED ORDER — ONDANSETRON HCL 4 MG/2ML IJ SOLN
4.0000 mg | Freq: Once | INTRAMUSCULAR | Status: AC
  Administered 2018-04-26: 4 mg via INTRAVENOUS
  Filled 2018-04-26: qty 2

## 2018-04-26 MED ORDER — MORPHINE SULFATE (PF) 4 MG/ML IV SOLN
2.0000 mg | Freq: Once | INTRAVENOUS | Status: AC
  Administered 2018-04-26: 2 mg via INTRAVENOUS
  Filled 2018-04-26: qty 1

## 2018-04-26 MED ORDER — ONDANSETRON HCL 4 MG/2ML IJ SOLN
4.0000 mg | Freq: Once | INTRAMUSCULAR | Status: AC
  Administered 2018-04-26: 4 mg via INTRAVENOUS

## 2018-04-26 MED ORDER — MORPHINE SULFATE (PF) 4 MG/ML IV SOLN
4.0000 mg | Freq: Once | INTRAVENOUS | Status: AC
  Administered 2018-04-26: 4 mg via INTRAVENOUS
  Filled 2018-04-26: qty 1

## 2018-04-26 MED ORDER — MORPHINE SULFATE 15 MG PO TABS: 15.0000 mg | ORAL_TABLET | ORAL | 0 refills | Status: DC | PRN

## 2018-04-26 NOTE — Discharge Instructions (Signed)
Return for worsening, inability to eat or drink or fever. Call your oncologist tomorrow.

## 2018-04-26 NOTE — ED Triage Notes (Signed)
Patient c/o epigastric pain that began today; hx of pancreatic cancer and last chemo was Wednesday (5 days ago). Also c/o nausea.
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
## 2018-04-27 ENCOUNTER — Encounter: Payer: Self-pay | Admitting: Oncology

## 2018-04-28 ENCOUNTER — Telehealth: Payer: Self-pay | Admitting: *Deleted

## 2018-04-28 ENCOUNTER — Inpatient Hospital Stay (HOSPITAL_COMMUNITY)
Admission: AD | Admit: 2018-04-28 | Discharge: 2018-05-05 | DRG: 444 | Disposition: A | Discharge status: Home / Self Care | Payer: Medicare Other | Attending: Family Medicine | Admitting: Family Medicine

## 2018-04-28 ENCOUNTER — Inpatient Hospital Stay (HOSPITAL_BASED_OUTPATIENT_CLINIC_OR_DEPARTMENT_OTHER): Payer: Medicare Other | Admitting: Medical

## 2018-04-28 ENCOUNTER — Encounter (HOSPITAL_COMMUNITY): Payer: Self-pay | Admitting: *Deleted

## 2018-04-28 VITALS — BP 117/72 | HR 64 | Temp 98.1°F | Resp 18

## 2018-04-28 DIAGNOSIS — Z882 Allergy status to sulfonamides status: Secondary | ICD-10-CM

## 2018-04-28 DIAGNOSIS — Z86711 Personal history of pulmonary embolism: Secondary | ICD-10-CM | POA: Diagnosis not present

## 2018-04-28 DIAGNOSIS — Z95828 Presence of other vascular implants and grafts: Secondary | ICD-10-CM | POA: Diagnosis not present

## 2018-04-28 DIAGNOSIS — Z888 Allergy status to other drugs, medicaments and biological substances status: Secondary | ICD-10-CM

## 2018-04-28 DIAGNOSIS — G893 Neoplasm related pain (acute) (chronic): Secondary | ICD-10-CM | POA: Diagnosis present

## 2018-04-28 DIAGNOSIS — Z8619 Personal history of other infectious and parasitic diseases: Secondary | ICD-10-CM | POA: Diagnosis not present

## 2018-04-28 DIAGNOSIS — C569 Malignant neoplasm of unspecified ovary: Secondary | ICD-10-CM | POA: Diagnosis present

## 2018-04-28 DIAGNOSIS — Z8507 Personal history of malignant neoplasm of pancreas: Secondary | ICD-10-CM

## 2018-04-28 DIAGNOSIS — K8309 Other cholangitis: Secondary | ICD-10-CM | POA: Diagnosis present

## 2018-04-28 DIAGNOSIS — K219 Gastro-esophageal reflux disease without esophagitis: Secondary | ICD-10-CM | POA: Diagnosis present

## 2018-04-28 DIAGNOSIS — E440 Moderate protein-calorie malnutrition: Secondary | ICD-10-CM | POA: Diagnosis not present

## 2018-04-28 DIAGNOSIS — I480 Paroxysmal atrial fibrillation: Secondary | ICD-10-CM | POA: Diagnosis present

## 2018-04-28 DIAGNOSIS — B961 Klebsiella pneumoniae [K. pneumoniae] as the cause of diseases classified elsewhere: Secondary | ICD-10-CM

## 2018-04-28 DIAGNOSIS — D72823 Leukemoid reaction: Secondary | ICD-10-CM | POA: Diagnosis not present

## 2018-04-28 DIAGNOSIS — I951 Orthostatic hypotension: Secondary | ICD-10-CM | POA: Diagnosis present

## 2018-04-28 DIAGNOSIS — I2699 Other pulmonary embolism without acute cor pulmonale: Secondary | ICD-10-CM | POA: Diagnosis not present

## 2018-04-28 DIAGNOSIS — G620 Drug-induced polyneuropathy: Secondary | ICD-10-CM | POA: Diagnosis present

## 2018-04-28 DIAGNOSIS — R7881 Bacteremia: Secondary | ICD-10-CM | POA: Diagnosis not present

## 2018-04-28 DIAGNOSIS — D701 Agranulocytosis secondary to cancer chemotherapy: Secondary | ICD-10-CM | POA: Diagnosis present

## 2018-04-28 DIAGNOSIS — R591 Generalized enlarged lymph nodes: Secondary | ICD-10-CM | POA: Diagnosis present

## 2018-04-28 DIAGNOSIS — Z96653 Presence of artificial knee joint, bilateral: Secondary | ICD-10-CM | POA: Diagnosis present

## 2018-04-28 DIAGNOSIS — G4733 Obstructive sleep apnea (adult) (pediatric): Secondary | ICD-10-CM | POA: Diagnosis present

## 2018-04-28 DIAGNOSIS — R7989 Other specified abnormal findings of blood chemistry: Secondary | ICD-10-CM | POA: Diagnosis present

## 2018-04-28 DIAGNOSIS — J150 Pneumonia due to Klebsiella pneumoniae: Secondary | ICD-10-CM | POA: Diagnosis present

## 2018-04-28 DIAGNOSIS — Y848 Other medical procedures as the cause of abnormal reaction of the patient, or of later complication, without mention of misadventure at the time of the procedure: Secondary | ICD-10-CM | POA: Diagnosis present

## 2018-04-28 DIAGNOSIS — T80218A Other infection due to central venous catheter, initial encounter: Secondary | ICD-10-CM | POA: Diagnosis not present

## 2018-04-28 DIAGNOSIS — Z978 Presence of other specified devices: Secondary | ICD-10-CM | POA: Diagnosis not present

## 2018-04-28 DIAGNOSIS — K831 Obstruction of bile duct: Secondary | ICD-10-CM | POA: Diagnosis present

## 2018-04-28 DIAGNOSIS — Z809 Family history of malignant neoplasm, unspecified: Secondary | ICD-10-CM | POA: Diagnosis not present

## 2018-04-28 DIAGNOSIS — C541 Malignant neoplasm of endometrium: Secondary | ICD-10-CM | POA: Diagnosis present

## 2018-04-28 DIAGNOSIS — Z7901 Long term (current) use of anticoagulants: Secondary | ICD-10-CM | POA: Diagnosis not present

## 2018-04-28 DIAGNOSIS — T451X5A Adverse effect of antineoplastic and immunosuppressive drugs, initial encounter: Secondary | ICD-10-CM | POA: Diagnosis present

## 2018-04-28 DIAGNOSIS — K759 Inflammatory liver disease, unspecified: Secondary | ICD-10-CM | POA: Diagnosis not present

## 2018-04-28 DIAGNOSIS — R197 Diarrhea, unspecified: Secondary | ICD-10-CM | POA: Diagnosis present

## 2018-04-28 DIAGNOSIS — R1084 Generalized abdominal pain: Secondary | ICD-10-CM | POA: Diagnosis not present

## 2018-04-28 DIAGNOSIS — Z923 Personal history of irradiation: Secondary | ICD-10-CM

## 2018-04-28 DIAGNOSIS — R740 Nonspecific elevation of levels of transaminase and lactic acid dehydrogenase [LDH]: Secondary | ICD-10-CM | POA: Diagnosis not present

## 2018-04-28 DIAGNOSIS — R945 Abnormal results of liver function studies: Secondary | ICD-10-CM | POA: Diagnosis not present

## 2018-04-28 DIAGNOSIS — I4891 Unspecified atrial fibrillation: Secondary | ICD-10-CM | POA: Diagnosis not present

## 2018-04-28 DIAGNOSIS — D6481 Anemia due to antineoplastic chemotherapy: Secondary | ICD-10-CM | POA: Diagnosis present

## 2018-04-28 DIAGNOSIS — R5383 Other fatigue: Secondary | ICD-10-CM | POA: Diagnosis not present

## 2018-04-28 DIAGNOSIS — I10 Essential (primary) hypertension: Secondary | ICD-10-CM | POA: Diagnosis present

## 2018-04-28 DIAGNOSIS — Z90411 Acquired partial absence of pancreas: Secondary | ICD-10-CM

## 2018-04-28 DIAGNOSIS — T80219A Unspecified infection due to central venous catheter, initial encounter: Secondary | ICD-10-CM | POA: Diagnosis present

## 2018-04-28 DIAGNOSIS — K2950 Unspecified chronic gastritis without bleeding: Secondary | ICD-10-CM | POA: Diagnosis present

## 2018-04-28 DIAGNOSIS — R195 Other fecal abnormalities: Secondary | ICD-10-CM | POA: Diagnosis present

## 2018-04-28 DIAGNOSIS — C259 Malignant neoplasm of pancreas, unspecified: Secondary | ICD-10-CM

## 2018-04-28 DIAGNOSIS — Z8249 Family history of ischemic heart disease and other diseases of the circulatory system: Secondary | ICD-10-CM

## 2018-04-28 DIAGNOSIS — E43 Unspecified severe protein-calorie malnutrition: Secondary | ICD-10-CM | POA: Diagnosis present

## 2018-04-28 DIAGNOSIS — R1013 Epigastric pain: Secondary | ICD-10-CM | POA: Diagnosis present

## 2018-04-28 DIAGNOSIS — M1990 Unspecified osteoarthritis, unspecified site: Secondary | ICD-10-CM | POA: Diagnosis present

## 2018-04-28 DIAGNOSIS — M5030 Other cervical disc degeneration, unspecified cervical region: Secondary | ICD-10-CM | POA: Diagnosis present

## 2018-04-28 DIAGNOSIS — B9689 Other specified bacterial agents as the cause of diseases classified elsewhere: Secondary | ICD-10-CM | POA: Diagnosis not present

## 2018-04-28 DIAGNOSIS — F419 Anxiety disorder, unspecified: Secondary | ICD-10-CM | POA: Diagnosis present

## 2018-04-28 DIAGNOSIS — R109 Unspecified abdominal pain: Secondary | ICD-10-CM | POA: Diagnosis present

## 2018-04-28 DIAGNOSIS — Z8542 Personal history of malignant neoplasm of other parts of uterus: Secondary | ICD-10-CM

## 2018-04-28 DIAGNOSIS — R002 Palpitations: Secondary | ICD-10-CM | POA: Diagnosis not present

## 2018-04-28 DIAGNOSIS — R112 Nausea with vomiting, unspecified: Secondary | ICD-10-CM

## 2018-04-28 DIAGNOSIS — R42 Dizziness and giddiness: Secondary | ICD-10-CM | POA: Diagnosis present

## 2018-04-28 DIAGNOSIS — E860 Dehydration: Secondary | ICD-10-CM | POA: Diagnosis present

## 2018-04-28 DIAGNOSIS — C250 Malignant neoplasm of head of pancreas: Secondary | ICD-10-CM | POA: Diagnosis present

## 2018-04-28 DIAGNOSIS — R748 Abnormal levels of other serum enzymes: Secondary | ICD-10-CM | POA: Diagnosis not present

## 2018-04-28 DIAGNOSIS — N139 Obstructive and reflux uropathy, unspecified: Secondary | ICD-10-CM | POA: Diagnosis present

## 2018-04-28 DIAGNOSIS — Z9221 Personal history of antineoplastic chemotherapy: Secondary | ICD-10-CM | POA: Diagnosis not present

## 2018-04-28 DIAGNOSIS — Z79899 Other long term (current) drug therapy: Secondary | ICD-10-CM

## 2018-04-28 DIAGNOSIS — F329 Major depressive disorder, single episode, unspecified: Secondary | ICD-10-CM | POA: Diagnosis not present

## 2018-04-28 DIAGNOSIS — D638 Anemia in other chronic diseases classified elsewhere: Secondary | ICD-10-CM | POA: Diagnosis present

## 2018-04-28 DIAGNOSIS — R040 Epistaxis: Secondary | ICD-10-CM | POA: Diagnosis not present

## 2018-04-28 DIAGNOSIS — D630 Anemia in neoplastic disease: Secondary | ICD-10-CM | POA: Diagnosis present

## 2018-04-28 DIAGNOSIS — Z90710 Acquired absence of both cervix and uterus: Secondary | ICD-10-CM

## 2018-04-28 DIAGNOSIS — D72819 Decreased white blood cell count, unspecified: Secondary | ICD-10-CM

## 2018-04-28 DIAGNOSIS — R5381 Other malaise: Secondary | ICD-10-CM | POA: Diagnosis not present

## 2018-04-28 DIAGNOSIS — R079 Chest pain, unspecified: Secondary | ICD-10-CM | POA: Diagnosis not present

## 2018-04-28 DIAGNOSIS — Z6822 Body mass index (BMI) 22.0-22.9, adult: Secondary | ICD-10-CM

## 2018-04-28 DIAGNOSIS — D72829 Elevated white blood cell count, unspecified: Secondary | ICD-10-CM | POA: Diagnosis not present

## 2018-04-28 LAB — CMP (CANCER CENTER ONLY)
ALK PHOS: 922 U/L — AB (ref 38–126)
ALT: 213 U/L — AB (ref 0–44)
ANION GAP: 10 (ref 5–15)
AST: 440 U/L (ref 15–41)
Albumin: 3.1 g/dL — ABNORMAL LOW (ref 3.5–5.0)
BILIRUBIN TOTAL: 1.3 mg/dL — AB (ref 0.3–1.2)
BUN: 22 mg/dL (ref 8–23)
CALCIUM: 8.9 mg/dL (ref 8.9–10.3)
CO2: 22 mmol/L (ref 22–32)
CREATININE: 1.19 mg/dL — AB (ref 0.44–1.00)
Chloride: 101 mmol/L (ref 98–111)
GFR, Est AFR Am: 52 mL/min — ABNORMAL LOW (ref >60)
GFR, Estimated: 45 mL/min — ABNORMAL LOW (ref >60)
GLUCOSE: 102 mg/dL — AB (ref 70–99)
Potassium: 4.6 mmol/L (ref 3.5–5.1)
Sodium: 133 mmol/L — ABNORMAL LOW (ref 135–145)
TOTAL PROTEIN: 6.7 g/dL (ref 6.5–8.1)

## 2018-04-28 LAB — CBC WITH DIFFERENTIAL (CANCER CENTER ONLY)
BASOS ABS: 0.1 10*3/uL (ref 0.0–0.1)
BASOS PCT: 1 %
EOS ABS: 0.1 10*3/uL (ref 0.0–0.5)
EOS PCT: 0 %
HCT: 26.6 % — ABNORMAL LOW (ref 34.8–46.6)
Hemoglobin: 8.8 g/dL — ABNORMAL LOW (ref 11.6–15.9)
Lymphocytes Relative: 6 %
Lymphs Abs: 1.6 10*3/uL (ref 0.9–3.3)
MCH: 28.8 pg (ref 25.1–34.0)
MCHC: 33 g/dL (ref 31.5–36.0)
MCV: 87.4 fL (ref 79.5–101.0)
Monocytes Absolute: 1 10*3/uL — ABNORMAL HIGH (ref 0.1–0.9)
Monocytes Relative: 3 %
NEUTROS ABS: 26.1 10*3/uL — AB (ref 1.5–6.5)
Neutrophils Relative %: 90 %
PLATELETS: 340 10*3/uL (ref 145–400)
RBC: 3.04 MIL/uL — ABNORMAL LOW (ref 3.70–5.45)
RDW: 15.3 % — AB (ref 11.2–14.5)
WBC Count: 28.8 10*3/uL — ABNORMAL HIGH (ref 3.9–10.3)

## 2018-04-28 MED ORDER — ACETAMINOPHEN 650 MG RE SUPP: 650.0000 mg | Freq: Four times a day (QID) | RECTAL | Status: DC | PRN

## 2018-04-28 MED ORDER — ACETAMINOPHEN 325 MG PO TABS
650.0000 mg | ORAL_TABLET | Freq: Four times a day (QID) | ORAL | Status: DC | PRN
  Administered 2018-05-03 – 2018-05-05 (×2): 650 mg via ORAL
  Filled 2018-04-28 (×2): qty 2

## 2018-04-28 MED ORDER — PIPERACILLIN-TAZOBACTAM 3.375 G IVPB
3.3750 g | Freq: Once | INTRAVENOUS | Status: AC
  Administered 2018-04-28: 3.375 g via INTRAVENOUS
  Filled 2018-04-28: qty 50

## 2018-04-28 MED ORDER — ALBUTEROL SULFATE (2.5 MG/3ML) 0.083% IN NEBU: 2.5000 mg | INHALATION_SOLUTION | Freq: Four times a day (QID) | RESPIRATORY_TRACT | Status: DC | PRN

## 2018-04-28 MED ORDER — ZOLPIDEM TARTRATE 5 MG PO TABS
5.0000 mg | ORAL_TABLET | Freq: Every evening | ORAL | Status: DC | PRN
  Administered 2018-04-28 – 2018-05-04 (×7): 5 mg via ORAL
  Filled 2018-04-28 (×7): qty 1

## 2018-04-28 MED ORDER — ONDANSETRON HCL 4 MG/2ML IJ SOLN
INTRAMUSCULAR | Status: AC
  Filled 2018-04-28: qty 4

## 2018-04-28 MED ORDER — SODIUM CHLORIDE 0.9 % IV SOLN
INTRAVENOUS | Status: AC
  Administered 2018-04-28 (×2): via INTRAVENOUS

## 2018-04-28 MED ORDER — METOPROLOL SUCCINATE ER 25 MG PO TB24: 25.0000 mg | ORAL_TABLET | Freq: Every day | ORAL | Status: DC

## 2018-04-28 MED ORDER — HYDROCODONE-ACETAMINOPHEN 5-325 MG PO TABS
1.0000 | ORAL_TABLET | ORAL | Status: DC | PRN
  Administered 2018-04-28 – 2018-04-30 (×5): 2 via ORAL
  Administered 2018-04-30: 1 via ORAL
  Administered 2018-04-30 – 2018-05-05 (×10): 2 via ORAL
  Filled 2018-04-28 (×16): qty 2

## 2018-04-28 MED ORDER — SODIUM CHLORIDE 0.9 % IV SOLN: Freq: Once | INTRAVENOUS | Status: DC

## 2018-04-28 MED ORDER — MORPHINE SULFATE (PF) 2 MG/ML IV SOLN
2.0000 mg | INTRAVENOUS | Status: DC | PRN
  Administered 2018-04-29: 2 mg via INTRAVENOUS
  Administered 2018-04-30: 4 mg via INTRAVENOUS
  Administered 2018-05-01 – 2018-05-03 (×8): 2 mg via INTRAVENOUS
  Filled 2018-04-28 (×5): qty 1
  Filled 2018-04-28: qty 2
  Filled 2018-04-28 (×4): qty 1

## 2018-04-28 MED ORDER — SODIUM CHLORIDE 0.9 % IV SOLN
Freq: Once | INTRAVENOUS | Status: AC
  Administered 2018-04-28: 16:00:00 via INTRAVENOUS
  Filled 2018-04-28: qty 250

## 2018-04-28 MED ORDER — ONDANSETRON HCL 4 MG/2ML IJ SOLN
8.0000 mg | Freq: Once | INTRAMUSCULAR | Status: AC
  Administered 2018-04-28: 8 mg via INTRAVENOUS

## 2018-04-28 MED ORDER — ONDANSETRON HCL 4 MG/2ML IJ SOLN
4.0000 mg | Freq: Four times a day (QID) | INTRAMUSCULAR | Status: DC | PRN
  Administered 2018-05-03: 4 mg via INTRAVENOUS
  Filled 2018-04-28: qty 2

## 2018-04-28 MED ORDER — ONDANSETRON HCL 4 MG PO TABS
4.0000 mg | ORAL_TABLET | Freq: Four times a day (QID) | ORAL | Status: DC | PRN
  Administered 2018-04-29 – 2018-05-01 (×2): 4 mg via ORAL
  Filled 2018-04-28 (×2): qty 1

## 2018-04-28 MED ORDER — BOOST / RESOURCE BREEZE PO LIQD CUSTOM
1.0000 | Freq: Three times a day (TID) | ORAL | Status: DC
  Administered 2018-04-28 – 2018-05-05 (×8): 1 via ORAL

## 2018-04-28 NOTE — Progress Notes (Signed)
Only one set of blood cultures drawn from port, unable to get second set from PIV site.  PA Lucianne Lei aware.  Pa Lucianne Lei called report to [employer].  Pt transported via [place].
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
## 2018-04-28 NOTE — Progress Notes (Signed)
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 metastatic pancreas cancer, currently being treated with gemcitabine/Abraxane.  She last received chemotherapy 04/22/2018 with G-CSF given on 04/23/2018.  She presents today with increased abdominal pain and nausea/vomiting.  The liver enzymes are elevated.  The marked leukocytosis is likely related to G-CSF.  The etiology of her symptoms and elevated liver enzymes is unclear, but I suspect obstruction of the biliary tract despite the CT findings from 04/26/2018.  She will be admitted and evaluated for a systemic infection.  I recommend GI consultation to further evaluate the elevated liver enzymes.  I appreciate the care from the medical service.  Julieanne Manson, MD

## 2018-04-28 NOTE — Patient Instructions (Signed)
Implanted Port Home Guide An implanted port is a type of central line that is placed under the skin. Central lines are used to provide IV access when treatment or nutrition needs to be given through a person's veins. Implanted ports are used for long-term IV access. An implanted port may be placed because:  You need IV medicine that would be irritating to the small veins in your hands or arms.  You need long-term IV medicines, such as antibiotics.  You need IV nutrition for a long period.  You need frequent blood draws for lab tests.  You need dialysis.  Implanted ports are usually placed in the chest area, but they can also be placed in the upper arm, the abdomen, or the leg. An implanted port has two main parts:  Reservoir. The reservoir is round and will appear as a small, raised area under your skin. The reservoir is the part where a needle is inserted to give medicines or draw blood.  Catheter. The catheter is a thin, flexible tube that extends from the reservoir. The catheter is placed into a large vein. Medicine that is inserted into the reservoir goes into the catheter and then into the vein.  How will I care for my incision site? Do not get the incision site wet. Bathe or shower as directed by your health care provider. How is my port accessed? Special steps must be taken to access the port:  Before the port is accessed, a numbing cream can be placed on the skin. This helps numb the skin over the port site.  Your health care provider uses a sterile technique to access the port. ? Your health care provider must put on a mask and sterile gloves. ? The skin over your port is cleaned carefully with an antiseptic and allowed to dry. ? The port is gently pinched between sterile gloves, and a needle is inserted into the port.  Only "non-coring" port needles should be used to access the port. Once the port is accessed, a blood return should be checked. This helps ensure that the port  is in the vein and is not clogged.  If your port needs to remain accessed for a constant infusion, a clear (transparent) bandage will be placed over the needle site. The bandage and needle will need to be changed every week, or as directed by your health care provider.  Keep the bandage covering the needle clean and dry. Do not get it wet. Follow your health care provider's instructions on how to take a shower or bath while the port is accessed.  If your port does not need to stay accessed, no bandage is needed over the port.  What is flushing? Flushing helps keep the port from getting clogged. Follow your health care provider's instructions on how and when to flush the port. Ports are usually flushed with saline solution or a medicine called heparin. The need for flushing will depend on how the port is used.  If the port is used for intermittent medicines or blood draws, the port will need to be flushed: ? After medicines have been given. ? After blood has been drawn. ? As part of routine maintenance.  If a constant infusion is running, the port may not need to be flushed.  How long will my port stay implanted? The port can stay in for as long as your health care provider thinks it is needed. When it is time for the port to come out, surgery will be   done to remove it. The procedure is similar to the one performed when the port was put in. When should I seek immediate medical care? When you have an implanted port, you should seek immediate medical care if:  You notice a bad smell coming from the incision site.  You have swelling, redness, or drainage at the incision site.  You have more swelling or pain at the port site or the surrounding area.  You have a fever that is not controlled with medicine.  This information is not intended to replace advice given to you by your health care provider. Make sure you discuss any questions you have with your health care provider. Document  Released: 09/09/2005 Document Revised: 02/15/2016 Document Reviewed: 05/17/2013 Elsevier Interactive Patient Education  2017 [employer]vier [employer].  

## 2018-04-28 NOTE — Telephone Encounter (Signed)
Patient called stating she was in the ED over the weekend with abd pains/chest pains that felt like she was having a heart attack. Patient was evaluated and cleared of any cardiac concerns. Patient reports pain has subsided but she is now vomiting regardless of antiemetics. Patient reports diarrhea at least 3 times this morning. She reports a developing rash surrounding her genitals. She states it is very uncomfortable, itching and burning. Consulted Dr. [name] and Lucianne Lei, patient will be seen in [facility] today for evaluation. Patient is in route now.

## 2018-04-29 ENCOUNTER — Encounter (HOSPITAL_COMMUNITY): Payer: Self-pay | Admitting: Radiology

## 2018-04-29 ENCOUNTER — Inpatient Hospital Stay (HOSPITAL_COMMUNITY): Payer: Medicare Other

## 2018-04-29 DIAGNOSIS — R112 Nausea with vomiting, unspecified: Secondary | ICD-10-CM

## 2018-04-29 DIAGNOSIS — Z7901 Long term (current) use of anticoagulants: Secondary | ICD-10-CM

## 2018-04-29 DIAGNOSIS — Z9221 Personal history of antineoplastic chemotherapy: Secondary | ICD-10-CM

## 2018-04-29 DIAGNOSIS — B961 Klebsiella pneumoniae [K. pneumoniae] as the cause of diseases classified elsewhere: Secondary | ICD-10-CM

## 2018-04-29 DIAGNOSIS — Z888 Allergy status to other drugs, medicaments and biological substances status: Secondary | ICD-10-CM

## 2018-04-29 DIAGNOSIS — Z882 Allergy status to sulfonamides status: Secondary | ICD-10-CM

## 2018-04-29 DIAGNOSIS — Z809 Family history of malignant neoplasm, unspecified: Secondary | ICD-10-CM

## 2018-04-29 DIAGNOSIS — R079 Chest pain, unspecified: Secondary | ICD-10-CM

## 2018-04-29 DIAGNOSIS — K759 Inflammatory liver disease, unspecified: Secondary | ICD-10-CM

## 2018-04-29 DIAGNOSIS — Z8619 Personal history of other infectious and parasitic diseases: Secondary | ICD-10-CM

## 2018-04-29 DIAGNOSIS — K831 Obstruction of bile duct: Secondary | ICD-10-CM

## 2018-04-29 DIAGNOSIS — D72829 Elevated white blood cell count, unspecified: Secondary | ICD-10-CM

## 2018-04-29 DIAGNOSIS — R7881 Bacteremia: Secondary | ICD-10-CM

## 2018-04-29 DIAGNOSIS — Z95828 Presence of other vascular implants and grafts: Secondary | ICD-10-CM

## 2018-04-29 DIAGNOSIS — C541 Malignant neoplasm of endometrium: Secondary | ICD-10-CM

## 2018-04-29 DIAGNOSIS — C259 Malignant neoplasm of pancreas, unspecified: Secondary | ICD-10-CM

## 2018-04-29 DIAGNOSIS — F329 Major depressive disorder, single episode, unspecified: Secondary | ICD-10-CM

## 2018-04-29 DIAGNOSIS — F419 Anxiety disorder, unspecified: Secondary | ICD-10-CM

## 2018-04-29 DIAGNOSIS — R1084 Generalized abdominal pain: Secondary | ICD-10-CM

## 2018-04-29 DIAGNOSIS — D72819 Decreased white blood cell count, unspecified: Secondary | ICD-10-CM

## 2018-04-29 DIAGNOSIS — R197 Diarrhea, unspecified: Secondary | ICD-10-CM

## 2018-04-29 DIAGNOSIS — R740 Nonspecific elevation of levels of transaminase and lactic acid dehydrogenase [LDH]: Secondary | ICD-10-CM

## 2018-04-29 DIAGNOSIS — I2699 Other pulmonary embolism without acute cor pulmonale: Secondary | ICD-10-CM

## 2018-04-29 DIAGNOSIS — R109 Unspecified abdominal pain: Secondary | ICD-10-CM

## 2018-04-29 DIAGNOSIS — R1013 Epigastric pain: Secondary | ICD-10-CM

## 2018-04-29 DIAGNOSIS — E440 Moderate protein-calorie malnutrition: Secondary | ICD-10-CM

## 2018-04-29 DIAGNOSIS — C250 Malignant neoplasm of head of pancreas: Secondary | ICD-10-CM

## 2018-04-29 DIAGNOSIS — T80219A Unspecified infection due to central venous catheter, initial encounter: Secondary | ICD-10-CM

## 2018-04-29 DIAGNOSIS — R002 Palpitations: Secondary | ICD-10-CM

## 2018-04-29 DIAGNOSIS — Z8507 Personal history of malignant neoplasm of pancreas: Secondary | ICD-10-CM

## 2018-04-29 DIAGNOSIS — I4891 Unspecified atrial fibrillation: Secondary | ICD-10-CM

## 2018-04-29 DIAGNOSIS — K8309 Other cholangitis: Secondary | ICD-10-CM

## 2018-04-29 DIAGNOSIS — Z800 Family history of malignant neoplasm of digestive organs: Secondary | ICD-10-CM

## 2018-04-29 DIAGNOSIS — R748 Abnormal levels of other serum enzymes: Secondary | ICD-10-CM

## 2018-04-29 LAB — COMPREHENSIVE METABOLIC PANEL
ALBUMIN: 3 g/dL — AB (ref 3.5–5.0)
ALBUMIN: 3.1 g/dL — AB (ref 3.5–5.0)
ALK PHOS: 702 U/L — AB (ref 38–126)
ALT: 156 U/L — ABNORMAL HIGH (ref 0–44)
ALT: 126 U/L — ABNORMAL HIGH (ref 0–44)
ANION GAP: 8 (ref 5–15)
ANION GAP: 10 (ref 5–15)
AST: 244 U/L — AB (ref 15–41)
AST: 156 U/L — AB (ref 15–41)
Alkaline Phosphatase: 657 U/L — ABNORMAL HIGH (ref 38–126)
BILIRUBIN TOTAL: 0.7 mg/dL (ref 0.3–1.2)
BUN: 18 mg/dL (ref 8–23)
BUN: 15 mg/dL (ref 8–23)
CALCIUM: 8.4 mg/dL — AB (ref 8.9–10.3)
CHLORIDE: 105 mmol/L (ref 98–111)
CO2: 23 mmol/L (ref 22–32)
CO2: 22 mmol/L (ref 22–32)
CREATININE: 0.99 mg/dL (ref 0.44–1.00)
Calcium: 8.5 mg/dL — ABNORMAL LOW (ref 8.9–10.3)
Chloride: 105 mmol/L (ref 98–111)
Creatinine, Ser: 1.19 mg/dL — ABNORMAL HIGH (ref 0.44–1.00)
GFR calc Af Amer: >60 mL/min (ref >60)
GFR calc Af Amer: 52 mL/min — ABNORMAL LOW (ref >60)
GFR calc non Af Amer: 56 mL/min — ABNORMAL LOW (ref >60)
GFR, EST NON AFRICAN AMERICAN: 45 mL/min — AB (ref >60)
GLUCOSE: 96 mg/dL (ref 70–99)
Glucose, Bld: 133 mg/dL — ABNORMAL HIGH (ref 70–99)
POTASSIUM: 3.5 mmol/L (ref 3.5–5.1)
Potassium: 3.7 mmol/L (ref 3.5–5.1)
SODIUM: 136 mmol/L (ref 135–145)
Sodium: 137 mmol/L (ref 135–145)
TOTAL PROTEIN: 6 g/dL — AB (ref 6.5–8.1)
Total Bilirubin: 0.6 mg/dL (ref 0.3–1.2)
Total Protein: 6 g/dL — ABNORMAL LOW (ref 6.5–8.1)

## 2018-04-29 LAB — CBC
HCT: 23.8 % — ABNORMAL LOW (ref 36.0–46.0)
HEMOGLOBIN: 8.1 g/dL — AB (ref 12.0–15.0)
MCH: 29.5 pg (ref 26.0–34.0)
MCHC: 34 g/dL (ref 30.0–36.0)
MCV: 86.5 fL (ref 78.0–100.0)
PLATELETS: 273 10*3/uL (ref 150–400)
RBC: 2.75 MIL/uL — ABNORMAL LOW (ref 3.87–5.11)
RDW: 15.4 % (ref 11.5–15.5)
WBC: 24.4 10*3/uL — ABNORMAL HIGH (ref 4.0–10.5)

## 2018-04-29 LAB — TSH: TSH: 3.369 u[IU]/mL (ref 0.350–4.500)

## 2018-04-29 LAB — PHOSPHORUS: Phosphorus: 3.6 mg/dL (ref 2.5–4.6)

## 2018-04-29 LAB — MAGNESIUM: MAGNESIUM: 1.9 mg/dL (ref 1.7–2.4)

## 2018-04-29 MED ORDER — SODIUM CHLORIDE 0.9 % IV SOLN
INTRAVENOUS | Status: DC | PRN
  Administered 2018-04-29: 500 mL via INTRAVENOUS
  Administered 2018-05-02 – 2018-05-03 (×2): 250 mL via INTRAVENOUS

## 2018-04-29 MED ORDER — METOPROLOL SUCCINATE ER 25 MG PO TB24
25.0000 mg | ORAL_TABLET | Freq: Every day | ORAL | Status: DC
  Administered 2018-04-30 – 2018-05-05 (×5): 25 mg via ORAL
  Filled 2018-04-29 (×6): qty 1

## 2018-04-29 MED ORDER — PIPERACILLIN-TAZOBACTAM 3.375 G IVPB
3.3750 g | Freq: Three times a day (TID) | INTRAVENOUS | Status: DC
Start: 2018-04-29 — End: 2018-05-05
  Administered 2018-04-29 – 2018-05-05 (×18): 3.375 g via INTRAVENOUS
  Filled 2018-04-29 (×21): qty 50

## 2018-04-29 MED ORDER — FLECAINIDE ACETATE 100 MG PO TABS
100.0000 mg | ORAL_TABLET | Freq: Two times a day (BID) | ORAL | Status: DC
  Administered 2018-04-29 – 2018-05-05 (×12): 100 mg via ORAL
  Filled 2018-04-29 (×15): qty 1

## 2018-04-29 MED ORDER — IOHEXOL 300 MG/ML  SOLN
75.0000 mL | Freq: Once | INTRAMUSCULAR | Status: AC | PRN
  Administered 2018-04-29: 75 mL via INTRAVENOUS

## 2018-04-29 NOTE — Progress Notes (Signed)
Initial Nutrition Assessment  DOCUMENTATION CODES:   Non-severe (moderate) malnutrition in context of chronic illness  INTERVENTION:   Continue Boost Breeze po TID, each supplement provides 250 kcal and 9 grams of protein  NUTRITION DIAGNOSIS:   Moderate Malnutrition related to chronic illness, cancer and cancer related treatments as evidenced by percent weight loss, moderate muscle depletion, mild fat depletion.  GOAL:   Patient will meet greater than or equal to 90% of their needs  MONITOR:   PO intake, Labs, Weight trends, I & O's  REASON FOR ASSESSMENT:   Malnutrition Screening Tool    ASSESSMENT:   72 y.o. female with medical history significant of pancreatic cancer prior Whipple resection and choledochojejunostomy; status post internal/external biliary drain on 12/05/17.small bowel stent placement May 2019  Patient in room talking with family at bedside. Pt in good spirits and sipping on a Sprite. Diet was just advanced to full liquids. Pt feels hungry and is craving some mashed potatoes. Pt has been having N/V for about a week, stating her worst day of vomiting was yesterday. At home she was mostly dry heaving, her food wasn't coming back up. Prior to developing N/V, pt states she was eating whatever she wanted. Eats plenty of meats in her diet. Will drink Ensure supplements on occasion but really doesn't care for them. States she tried the [employer] supplements and she likes them. Will continue those supplements.   Per weight records, pt has lost 17 lb since 4/4 (12% wt loss x 4 months, significant for time frame).   Labs reviewed. Medications: Zofran PRN  NUTRITION - FOCUSED PHYSICAL EXAM:    Most Recent Value  Orbital Region  No depletion  Upper Arm Region  Mild depletion  Thoracic and Lumbar Region  Unable to assess  Buccal Region  No depletion  Temple Region  Moderate depletion  Clavicle Bone Region  Moderate depletion  Clavicle and Acromion Bone Region   Moderate depletion  Scapular Bone Region  No depletion  Dorsal Hand  No depletion  Patellar Region  No depletion  Anterior Thigh Region  No depletion  Posterior Calf Region  No depletion  Edema (RD Assessment)  None       Diet Order:   Diet Order           Diet full liquid Room service appropriate? Yes; Fluid consistency: Thin  Diet effective now          EDUCATION NEEDS:   No education needs have been identified at this time  Skin:  Skin Assessment: Reviewed RN Assessment  Last BM:  8/6  Height:   Ht Readings from Last 1 Encounters:  04/26/18 5\' 2"  (1.575 m)    Weight:   Wt Readings from Last 1 Encounters:  04/26/18 125 lb (56.7 kg)    Ideal Body Weight:  50 kg  BMI:  22.9 kg/m^2  Estimated Nutritional Needs:   Kcal:  1700-1900  Protein:  85-95g  Fluid:  1.9L/day   Clayton Bibles, MS, RD, LDN [place] Diet[facility] Pager: [phone] After Hours Pager: [phone]

## 2018-04-29 NOTE — Progress Notes (Addendum)
IP PROGRESS NOTE  Subjective:   [name] continues to have abdominal pain.  She complains of a headache and nausea.  No further emesis.  Objective: Vital signs in last 24 hours: Blood pressure 125/72, pulse 65, temperature 98.6 F (37 C), temperature source Oral, resp. rate 20, SpO2 98 %.  Intake/Output from previous day: 08/06 0701 - 08/07 0700 In: 789.7 [I.V.:755; IV Piggyback:34.7] Out: 300 [Urine:300]  Physical Exam:  HEENT: No thrush or ulcers Lungs: Clear bilaterally Cardiac: Regular rate and rhythm Abdomen: Soft, tender throughout the upper abdomen, fullness in the right upper abdomen, no hepatomegaly Extremities: No leg edema   Portacath/PICC-without erythema  Lab Results: Recent Labs    04/28/18 1417 04/29/18 0500  WBC 28.8* 24.4*  HGB 8.8* 8.1*  HCT 26.6* 23.8*  PLT 340 273    BMET Recent Labs    04/28/18 1417 04/29/18 0500  NA 133* 136  K 4.6 3.7  CL 101 105  CO2 22 23  GLUCOSE 102* 96  BUN 22 18  CREATININE 1.19* 0.99  CALCIUM 8.9 8.4*  Alkaline phosphatase 702, AST 244, ALT 156, bilirubin 0.7  Medications: I have reviewed the patient's current medications.  Assessment/Plan:  1. 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  Gemcitabine held 11/02/2014 due to neutropenia.  Gemcitabine 11/09/2014 dose reduced 800 mg/m.  Gemcitabine held 11/16/2014 due to neutropenia.  Gemcitabine resumed 11/23/2014 every 2 week schedule.  Cycle 6 gemcitabine 01/05/2015  Cycle 7 gemcitabine 01/18/2015  Cycle 8 gemcitabine 02/01/2015  Cycle 9 gemcitabine 02/15/2015  Cycle 10 gemcitabine 03/01/2015  Cycle 11  gemcitabine 03/15/2015  Cycle 12 gemcitabine 03/29/2015  Elevated CA 19-01 May 2016  CTs 06/17/2016-new soft tissue mass at the root of the mesentery with vascular involvement  PET 06/27/2016-hypermetabolic activity associated with soft tissue adjacent to surgical clips in the central mesentery  Status post SBRTto the mesenteric mass completed 07/26/2016  CT abdomen/pelvis 08/23/2016 -mesenteric mass stable to slightly decreased in size.  Cycle 1 FOLFOX 09/03/2016  Cycle 2 FOLFOX 09/30/2016  Cycle 3 FOLFOX 10/14/2016   Cycle 4 FOLFOX 11/05/2016 (5-FU bolus eliminated and 5-FU pump dose reduced)  Cycle 5 FOLFOX 11/19/2016  CT abdomen/pelvis 11/28/2016-decreased size of soft tissue at the small bowel mesentery, mild asymmetric soft tissue at the left vaginal cuff  Cycle 6 FOLFOX 12/10/2016 (5-FU infusion further reduced and oxaliplatin reduced)  Cycle 7 FOLFOX 12/31/2016  Cycle 8 FOLFOX 01/21/2017   CT 02/24/2017-slight decrease in size of the mesenteric mass, resolution of soft tissue fullness at the left vaginal cuff, no evidence of disease progression  CT abdomen/pelvis 06/20/2017-stable ill-defined soft tissue at the mesenteric root, mild increased asymmetry at the left vaginal cuff, no other evidence of disease progression  Elevated CA 19-9 09/11/2018  CT abdomen/pelvis 11/18/2017-no evidence of recurrent pancreas cancer, dilated afferentloop, intrahepatic biliary dilatation  PET scan 01/16/2018-low level hypermetabolic activity in the porta hepatis which appears to correspond with a mildly enlarged lymph node on MRI. No other suspicious nodal activity in the abdomen. Small right paratracheal and subcarinal lymph nodes with low-level hypermetabolic activity in the chest. No abnormal activity within the liver. Findings suspicious for obstruction at the right ureteropelvic junction. Asymmetric left oropharyngeal activity.  CT abdomen/pelvis 03/31/2018- significant  increaseinright-sided retroperitoneal soft tissue which invades and encases the IVC, celiac trunk, SMA, duodenum and right renal pelvis. Interval progression of intrahepatic and common bile duct dilatation. New right-sided obstructive uropathy secondary to tumor invasion of the right renal pelvis.  Cycle 1 gemcitabine/Abraxane 04/08/2018  Cycle 2 gemcitabine/Abraxane 04/22/2018   2. bile duct obstruction secondary to #1, status post an ERCP with stent placement 09/23/2015hypermetabolic soft tissue in the central mesentery, no other evidence of metastatic disease, stable mildly enlarged portal caval node  3. Admission with post ERCP pancreatitis 06/16/2014  4. History of abdominal pain secondary to #1  5. Pulmonary embolism diagnosed on a CT of the abdomen 09/16/2014  Negative lower extremity Dopplers 09/17/2014  6. Multiple orthopedic surgical procedures  7. Endometrial cancer,stage IA, grade 1 endometrioid adenocarcinoma, 18% myometrial invasion, no lymphovascular space involvement, negative washings  Status post robotic total hysterectomy and bilateral salpingo-oophorectomy 11/30/2010  Recurrent tumor left lateral vagina status post biopsy 11/24/2014 with pathology confirming adenocarcinoma with focal squamous differentiation consistent with endometrial adenocarcinoma  Staging CT scans 12/06/2014 with no evidence of local pancreatic cancer recurrence. Small fluid collection adjacent to the left adrenal gland. Severe hepatic steatosis. No evidence of local extension of endometrial carcinoma. Carcinoma not well-defined at the vaginal cuff. 5 mm right external iliac lymph node. 3.6 mm left external iliac lymph node  Brachytherapy initiated 12/22/2014, completed 01/19/2015  CT abdomen/pelvis 07/24/2015 revealed a 3 x 4 cm soft tissue focus at the vaginal apex  PET scan 08/11/2015 revealed no mass at the vaginal apex and no evidence of metastatic disease  CT  11/28/2016-mild asymmetric soft tissue at the left vaginal cuff, resolved on CT 02/24/2017  CT abdomen/pelvis 06/20/2017-stable mild ill-defined soft tissue density in the mesenteric root. Stable mild portacaval lymphadenopathy and subcentimeter right retroperitoneal lymph nodes. Mild increased size of asymmetric soft tissue density involving the left vaginal cuff.  PET scan 07/19/2017-no suspicious hypermetabolic activity within the neck, chest, abdomen or pelvis. Specifically no evidence of residual hypermetabolic tumor in the surgical bed or at the vaginal cuff. Hypermetabolic activity in the lumbar spine at the level of the right L3-4 facet joint appears degenerative.  MRI abdomen 12/02/2017-focal area of abnormal signal in the caudate lobe of the liver-unclear etiology, dilation of the hepaticojejunostomy loop, bile ducts, and pancreatic duct-stricture formation versus recurrent tumor  8. History of atrial fibrillation-maintained on xarelto  9. Family history of multiple cancers-negative CancerNext gene panel  10. Prolonged nausea following the pancreaticoduodenectomy. Improved 10/26/2014.  11. Port-A-Cath placement 10/21/2014.  12. History of Neutropenia secondary to chemotherapy   13. Diarrhea. Question pancreatic insufficiency. Pancreatic enzyme replacement initiated 01/05/2015. Recurrent diarrhea following a course of antibiotics March 2017.  14. History of positional vertigo-resolved  15. 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 Neutropenia secondary to chemotherapy, G-CSF will be added with cycle 2 gemcitabine/Abraxane  17. Delayed nausea and diarrhea following FOLFOX. Emend added with cycle 2. Decadron prophylaxis added with cycle 3  18. Diarrhea 10/28/2016. Question related to chemotherapy. Negative C. difficile  testing 10/31/2016.  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  20.Admission 12/02/2017 with Bacteroides bacteremia  Biliary obstruction documented on CT/MRI 12/02/2017  Upper endoscopy 12/05/2017 revealed angulation of the apparent limb precluding intubation  Status post placement of a biliary drain 12/05/2017, replaced 12/09/2017  Bile duct brushings 12/09/2017-negative for malignancy  Biliary drain cholangiogram 12/18/2017-there is patency of the biliary-enteric anastomosis with no contrast  traverses the afferentlimb into the jejunum  Jejunal stent placed 01/28/2018, brush biopsy negative for malignancy  Biliary drain removed 02/11/2018  21.Admission 12/29/2017 through 01/02/2018 with sepsis secondary to Klebsiella bacteremia/cholangitis. 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  22.Right pelvicaliectasis on the PET scan 01/16/2018 with perinephric soft tissue fullness, the right ureter is not dilated 23.  Admission 04/28/2018 with nausea/vomiting, increased abdominal pain, and a headache 24.  Elevated liver enzymes on admission 04/28/2018-etiology unclear, improved  [name] has metastatic pancreas cancer.  She was admitted yesterday with nausea/vomiting and abdominal pain.  The liver enzymes are elevated.  A CT obtained via the emergency room on 04/26/2018 revealed a slight decrease in the abnormal right abdominal soft tissue with a decrease in extrahepatic and pancreatic ductal dilatation.  The CA 19-9 was slightly lower on 04/21/2018.  I doubt her current presentation is related to toxicity from chemotherapy or progression of pancreas cancer.  I agree with GI consultation to evaluate the elevated liver enzymes in the setting of a jejunal stent and previous external biliary drain.  The leukocytosis is likely secondary to G-CSF administered on  04/23/2018.  Recommendations: 1.  Continue narcotic analgesics as needed for pain 2.  GI consult  I will continue following [name] while in the hospital.  Outpatient follow-up will be scheduled.      LOS: 1 day   Betsy Coder, MD   04/29/2018, 7:49 AM
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
## 2018-04-29 NOTE — Progress Notes (Signed)
These preliminary result these preliminary results were noted.  Awaiting final report.

## 2018-04-29 NOTE — Progress Notes (Signed)
PROGRESS NOTE    [name]   UVO:[id number]  DOB: 22-Nov-1945  DOA: 04/28/2018 PCP: Chesley Noon, MD   Brief Narrative:  [name] is a 72 y/o female with pancreatic cancer status post Whipple resection and choledochojejunostomy, internal/external biliary drain on 12/05/2017, small bowel stent placement May 2019, A. fib, obesity, sleep apnea, PE and hypertension, who presented to the ER on 04/28/2018 with nausea and vomiting & abdominal pain. Last chemotherapy last on 04/22/2018 Found to have elevated LFTs and WBC count in the 20s and admitted for possible cholangitis and started on Zosyn. GI consulted. GI subsequently requested an ID eval.   Subjective:  Having intermittent abdominal pain with nausea. No diarrhea.     Assessment & Plan:   Principal Problem: Elevated LFTs, leukocytosis and abdominal pain - ? Cholangitis- initial stent placed in 9/15 - cont Zosyn- appreciate ID and GI evals - cont full liquid diet, PRN Morphine, Zofran and Hydrocodone  Active Problems:     HTN (hypertension)/ PAF - HR in 50s- Toprol has holding parameters and was not given today -  Xarelto on hold for possible procedure - cont Flecainide    Adenocarcinoma of head of pancreas  - Dr Ammie Dalton has evaluated the patient and is following    Protein-calorie malnutrition, severe  - cont supplements recommended by nutrionist      DVT prophylaxis: SCDs Code Status: full code Family Communication:  Disposition Plan: f/u on LFTs and pain Consultants:   Medical oncology  GI  ID Procedures:     Antimicrobials:  Anti-infectives (From admission, onward)   Start     Dose/Rate Route Frequency Ordered Stop   04/29/18 0200  piperacillin-tazobactam (ZOSYN) IVPB 3.375 g     3.375 g 12.5 mL/hr over 240 Minutes Intravenous Every 8 hours 04/29/18 0149         Objective: Vitals:   04/28/18 2230 04/29/18 1345 04/29/18 1457  BP: 125/72 104/74 120/65  Pulse: 65 (!) 53 (!) 57    Resp: 20  17  Temp: 98.6 F (37 C)  98 F (36.7 C)  TempSrc: Oral  Oral  SpO2: 98%  99%    Intake/Output Summary (Last 24 hours) at 04/29/2018 1644 Last data filed at 04/29/2018 0553 Gross per 24 hour  Intake 789.66 ml  Output 300 ml  Net 489.66 ml   There were no vitals filed for this visit.  Examination: General exam: Appears comfortable  HEENT: PERRLA, oral mucosa moist, no sclera icterus or thrush Respiratory system: Clear to auscultation. Respiratory effort normal. Cardiovascular system: S1 & S2 heard, RRR.   Gastrointestinal system: Abdomen soft,  Tenderness in epigastrium, nondistended. Normal bowel sound. No organomegaly Central nervous system: Alert and oriented. No focal neurological deficits. Extremities: No cyanosis, clubbing or edema Skin: No rashes or ulcers Psychiatry:  Mood & affect appropriate.     Data Reviewed: I have personally reviewed following labs and imaging studies  CBC: Recent Labs  Lab 04/26/18 1920 04/28/18 1417 04/29/18 0500  WBC 20.8* 28.8* 24.4*  NEUTROABS  --  26.1*  --   HGB 8.6* 8.8* 8.1*  HCT 26.1* 26.6* 23.8*  MCV 90.0 87.4 86.5  PLT 411* 340 742   Basic Metabolic Panel: Recent Labs  Lab 04/26/18 1920 04/28/18 1417 04/29/18 0500  NA 130* 133* 136  K 3.9 4.6 3.7  CL 98 101 105  CO2 19* 22 23  GLUCOSE 92 102* 96  BUN 15 22 18   CREATININE 1.03* 1.19* 0.99  CALCIUM 8.8* 8.9 8.4*  MG  --   --  1.9  PHOS  --   --  3.6   GFR: Estimated Creatinine Clearance: 41.2 mL/min (by C-G formula based on SCr of 0.99 mg/dL). Liver Function Tests: Recent Labs  Lab 04/26/18 1920 04/28/18 1417 04/29/18 0500  AST 41 440* 244*  ALT 29 213* 156*  ALKPHOS 318* 922* 702*  BILITOT 0.8 1.3* 0.7  PROT 6.6 6.7 6.0*  ALBUMIN 3.1* 3.1* 3.0*   Recent Labs  Lab 04/26/18 1920  LIPASE 29   No results for input(s): AMMONIA in the last 168 hours. Coagulation Profile: No results for input(s): INR, PROTIME in the last 168 hours. Cardiac  Enzymes: No results for input(s): CKTOTAL, CKMB, CKMBINDEX, TROPONINI in the last 168 hours. BNP (last 3 results) Recent Labs    12/19/17 1257  PROBNP 103   HbA1C: No results for input(s): HGBA1C in the last 72 hours. CBG: No results for input(s): GLUCAP in the last 168 hours. Lipid Profile: No results for input(s): CHOL, HDL, LDLCALC, TRIG, CHOLHDL, LDLDIRECT in the last 72 hours. Thyroid Function Tests: Recent Labs    04/29/18 1510  TSH 3.369   Anemia Panel: No results for input(s): VITAMINB12, FOLATE, FERRITIN, TIBC, IRON, RETICCTPCT in the last 72 hours. Urine analysis:    Component Value Date/Time   COLORURINE STRAW (A) 04/26/2018 1939   APPEARANCEUR CLEAR 04/26/2018 1939   LABSPEC 1.004 (L) 04/26/2018 1939   LABSPEC 1.005 04/21/2014 1713   PHURINE 5.0 04/26/2018 1939   GLUCOSEU NEGATIVE 04/26/2018 1939   GLUCOSEU Negative 04/21/2014 1713   HGBUR NEGATIVE 04/26/2018 1939   BILIRUBINUR NEGATIVE 04/26/2018 1939   BILIRUBINUR Negative 04/21/2014 1713   KETONESUR NEGATIVE 04/26/2018 1939   PROTEINUR NEGATIVE 04/26/2018 1939   UROBILINOGEN 0.2 10/16/2014 1035   UROBILINOGEN 0.2 04/21/2014 1713   NITRITE NEGATIVE 04/26/2018 1939   LEUKOCYTESUR NEGATIVE 04/26/2018 1939   LEUKOCYTESUR Trace 04/21/2014 1713   Sepsis Labs: @LABRCNTIP (procalcitonin:4,lacticidven:4) ) Recent Results (from the past 240 hour(s))  Culture, Blood     Status: None (Preliminary result)   Collection Time: 04/28/18  4:07 PM  Result Value Ref Range Status   Specimen Description   Final    PORTA CATH Performed at [facility], [place] [address], [place], [place] [address]    Special Requests   Final    BOTTLES DRAWN AEROBIC AND ANAEROBIC Blood Culture adequate volume   Culture   Final    NO GROWTH < 24 HOURS Performed at [facility], [place] [address], [place], [place] [address]    Report Status PENDING  Incomplete         Radiology Studies: Ct Head W & Wo  Contrast  Result Date: 04/29/2018 CLINICAL DATA:  Headaches for 7 days. History of pancreatic cancer and endometrial cancer. Restaging. EXAM: CT HEAD WITHOUT AND WITH CONTRAST TECHNIQUE: Contiguous axial images were obtained from the base of the skull through the vertex without and with intravenous contrast CONTRAST:  41mL OMNIPAQUE IOHEXOL 300 MG/ML  SOLN COMPARISON:  PET scan 01/16/2018 FINDINGS: Brain: No evidence for acute infarction, hemorrhage, mass lesion, hydrocephalus, or extra-axial fluid. Normal for age cerebral volume. Slight hypoattenuation of white matter, favored to represent post treatment effect/small vessel disease Post infusion, no abnormal enhancement of the brain or meninges. Vascular: Calcification of the cavernous internal carotid arteries consistent with cerebrovascular atherosclerotic disease. No signs of intracranial large vessel occlusion. Skull: Calvarium intact. Sinuses/Orbits: Negative sinuses.  No orbital  abnormality. Other: None. IMPRESSION: No evidence of intracranial metastatic disease. No cause for the reported symptoms is identified. Electronically Signed   By: Staci Righter M.D.   On: 04/29/2018 11:12      Scheduled Meds: . feeding supplement  1 Container Oral TID BM  . flecainide  100 mg Oral BID  . metoprolol succinate  25 mg Oral Daily   Continuous Infusions: . sodium chloride Stopped (04/29/18 0259)  . piperacillin-tazobactam (ZOSYN)  IV 3.375 g (04/29/18 1130)     LOS: 1 day    Time spent in minutes: 35    Debbe Odea, MD [facility] Pager: [email or web address] Password [facility] 04/29/2018, 4:44 PM

## 2018-04-30 DIAGNOSIS — R5383 Other fatigue: Secondary | ICD-10-CM

## 2018-04-30 DIAGNOSIS — K8309 Other cholangitis: Secondary | ICD-10-CM

## 2018-04-30 DIAGNOSIS — R5381 Other malaise: Secondary | ICD-10-CM

## 2018-04-30 LAB — CBC
HEMATOCRIT: 25.8 % — AB (ref 36.0–46.0)
Hemoglobin: 8.9 g/dL — ABNORMAL LOW (ref 12.0–15.0)
MCH: 29.5 pg (ref 26.0–34.0)
MCHC: 34.5 g/dL (ref 30.0–36.0)
MCV: 85.4 fL (ref 78.0–100.0)
Platelets: 287 10*3/uL (ref 150–400)
RBC: 3.02 MIL/uL — ABNORMAL LOW (ref 3.87–5.11)
RDW: 15.8 % — AB (ref 11.5–15.5)
WBC: 26.5 10*3/uL — ABNORMAL HIGH (ref 4.0–10.5)

## 2018-04-30 LAB — HIV ANTIBODY (ROUTINE TESTING W REFLEX): HIV Screen 4th Generation wRfx: NONREACTIVE

## 2018-04-30 MED ORDER — SENNOSIDES-DOCUSATE SODIUM 8.6-50 MG PO TABS
2.0000 | ORAL_TABLET | Freq: Every day | ORAL | Status: DC
  Administered 2018-04-30 – 2018-05-04 (×5): 2 via ORAL
  Filled 2018-04-30 (×5): qty 2

## 2018-04-30 MED ORDER — GI COCKTAIL ~~LOC~~: 10.0000 mL | Freq: Three times a day (TID) | ORAL | Status: DC | PRN

## 2018-04-30 MED ORDER — POLYETHYLENE GLYCOL 3350 17 G PO PACK
17.0000 g | PACK | Freq: Every day | ORAL | Status: DC
  Administered 2018-04-30 – 2018-05-05 (×5): 17 g via ORAL
  Filled 2018-04-30 (×5): qty 1

## 2018-04-30 MED ORDER — GI COCKTAIL ~~LOC~~: 30.0000 mL | Freq: Three times a day (TID) | ORAL | Status: DC | PRN

## 2018-04-30 NOTE — Progress Notes (Signed)
IP PROGRESS NOTE  Subjective:   [name] reports the nausea has improved.  He continues to have a headache.  She had a "black" stool last night.  She reports discomfort at the left upper anterior chest wall for the past several weeks.  She wonders whether this is related to mowing her lawn.  Objective: Vital signs in last 24 hours: Blood pressure 123/71, pulse 64, temperature 98 F (36.7 C), temperature source Oral, resp. rate 16, SpO2 98 %.  Intake/Output from previous day: 08/07 0701 - 08/08 0700 In: 442.9 [P.O.:360; IV Piggyback:82.9] Out: 1400 [Urine:1400]  Physical Exam:  HEENT: No thrush or ulcers Lungs: Clear bilaterally Cardiac: Regular rate and rhythm Abdomen: Soft, tender throughout the upper abdomen Extremities: No leg edema   Portacath/PICC-without erythema, no tenderness along the subcutaneous tract, no fluctuance, mild tenderness over the pectoral muscle lateral to the port  Lab Results: Recent Labs    04/28/18 1417 04/29/18 0500  WBC 28.8* 24.4*  HGB 8.8* 8.1*  HCT 26.6* 23.8*  PLT 340 273    BMET Recent Labs    04/29/18 0500 04/29/18 2030  NA 136 137  K 3.7 3.5  CL 105 105  CO2 23 22  GLUCOSE 96 133*  BUN 18 15  CREATININE 0.99 1.19*  CALCIUM 8.4* 8.5*  Alkaline phosphatase 702, AST 244, ALT 156, bilirubin 0.7  Medications: I have reviewed the patient's current medications.  Assessment/Plan:  1. 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  Gemcitabine held 11/02/2014 due to neutropenia.  Gemcitabine 11/09/2014 dose reduced 800 mg/m.  Gemcitabine held 11/16/2014 due to neutropenia.  Gemcitabine  resumed 11/23/2014 every 2 week schedule.  Cycle 6 gemcitabine 01/05/2015  Cycle 7 gemcitabine 01/18/2015  Cycle 8 gemcitabine 02/01/2015  Cycle 9 gemcitabine 02/15/2015  Cycle 10 gemcitabine 03/01/2015  Cycle 11 gemcitabine 03/15/2015  Cycle 12 gemcitabine 03/29/2015  Elevated CA 19-01 May 2016  CTs 06/17/2016-new soft tissue mass at the root of the mesentery with vascular involvement  PET 06/27/2016-hypermetabolic activity associated with soft tissue adjacent to surgical clips in the central mesentery  Status post SBRTto the mesenteric mass completed 07/26/2016  CT abdomen/pelvis 08/23/2016 -mesenteric mass stable to slightly decreased in size.  Cycle 1 FOLFOX 09/03/2016  Cycle 2 FOLFOX 09/30/2016  Cycle 3 FOLFOX 10/14/2016   Cycle 4 FOLFOX 11/05/2016 (5-FU bolus eliminated and 5-FU pump dose reduced)  Cycle 5 FOLFOX 11/19/2016  CT abdomen/pelvis 11/28/2016-decreased size of soft tissue at the small bowel mesentery, mild asymmetric soft tissue at the left vaginal cuff  Cycle 6 FOLFOX 12/10/2016 (5-FU infusion further reduced and oxaliplatin reduced)  Cycle 7 FOLFOX 12/31/2016  Cycle 8 FOLFOX 01/21/2017   CT 02/24/2017-slight decrease in size of the mesenteric mass, resolution of soft tissue fullness at the left vaginal cuff, no evidence of disease progression  CT abdomen/pelvis 06/20/2017-stable ill-defined soft tissue at the mesenteric root, mild increased asymmetry at the left vaginal cuff, no other evidence of disease progression  Elevated CA 19-9 09/11/2018  CT abdomen/pelvis 11/18/2017-no evidence of recurrent pancreas cancer, dilated afferentloop, intrahepatic biliary dilatation  PET scan 01/16/2018-low level hypermetabolic activity in the porta hepatis which appears to correspond with a mildly enlarged lymph node on MRI. No other suspicious nodal activity in the  abdomen. Small right paratracheal and subcarinal lymph nodes with low-level  hypermetabolic activity in the chest. No abnormal activity within the liver. Findings suspicious for obstruction at the right ureteropelvic junction. Asymmetric left oropharyngeal activity.  CT abdomen/pelvis 03/31/2018- significant increaseinright-sided retroperitoneal soft tissue which invades and encases the IVC, celiac trunk, SMA, duodenum and right renal pelvis. Interval progression of intrahepatic and common bile duct dilatation. New right-sided obstructive uropathy secondary to tumor invasion of the right renal pelvis.  Cycle 1 gemcitabine/Abraxane 04/08/2018  Cycle 2 gemcitabine/Abraxane 04/22/2018   2. bile duct obstruction secondary to #1, status post an ERCP with stent placement 09/23/2015hypermetabolic soft tissue in the central mesentery, no other evidence of metastatic disease, stable mildly enlarged portal caval node  3. Admission with post ERCP pancreatitis 06/16/2014  4. History of abdominal pain secondary to #1  5. Pulmonary embolism diagnosed on a CT of the abdomen 09/16/2014  Negative lower extremity Dopplers 09/17/2014  6. Multiple orthopedic surgical procedures  7. Endometrial cancer,stage IA, grade 1 endometrioid adenocarcinoma, 18% myometrial invasion, no lymphovascular space involvement, negative washings  Status post robotic total hysterectomy and bilateral salpingo-oophorectomy 11/30/2010  Recurrent tumor left lateral vagina status post biopsy 11/24/2014 with pathology confirming adenocarcinoma with focal squamous differentiation consistent with endometrial adenocarcinoma  Staging CT scans 12/06/2014 with no evidence of local pancreatic cancer recurrence. Small fluid collection adjacent to the left adrenal gland. Severe hepatic steatosis. No evidence of local extension of endometrial carcinoma. Carcinoma not well-defined at the vaginal cuff. 5 mm right external iliac lymph node. 3.6 mm left external iliac lymph node  Brachytherapy  initiated 12/22/2014, completed 01/19/2015  CT abdomen/pelvis 07/24/2015 revealed a 3 x 4 cm soft tissue focus at the vaginal apex  PET scan 08/11/2015 revealed no mass at the vaginal apex and no evidence of metastatic disease  CT 11/28/2016-mild asymmetric soft tissue at the left vaginal cuff, resolved on CT 02/24/2017  CT abdomen/pelvis 06/20/2017-stable mild ill-defined soft tissue density in the mesenteric root. Stable mild portacaval lymphadenopathy and subcentimeter right retroperitoneal lymph nodes. Mild increased size of asymmetric soft tissue density involving the left vaginal cuff.  PET scan 07/19/2017-no suspicious hypermetabolic activity within the neck, chest, abdomen or pelvis. Specifically no evidence of residual hypermetabolic tumor in the surgical bed or at the vaginal cuff. Hypermetabolic activity in the lumbar spine at the level of the right L3-4 facet joint appears degenerative.  MRI abdomen 12/02/2017-focal area of abnormal signal in the caudate lobe of the liver-unclear etiology, dilation of the hepaticojejunostomy loop, bile ducts, and pancreatic duct-stricture formation versus recurrent tumor  8. History of atrial fibrillation-maintained on xarelto  9. Family history of multiple cancers-negative CancerNext gene panel  10. Prolonged nausea following the pancreaticoduodenectomy. Improved 10/26/2014.  11. Port-A-Cath placement 10/21/2014.  12. History of Neutropenia secondary to chemotherapy   13. Diarrhea. Question pancreatic insufficiency. Pancreatic enzyme replacement initiated 01/05/2015. Recurrent diarrhea following a course of antibiotics March 2017.  14. History of positional vertigo-resolved  15. 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 Neutropenia secondary to chemotherapy, G-CSF will be added with  cycle 2 gemcitabine/Abraxane  17. Delayed nausea and diarrhea following FOLFOX. Emend added with cycle 2. Decadron prophylaxis added with cycle 3  18. Diarrhea 10/28/2016. Question related to chemotherapy. Negative C. difficile testing 10/31/2016.  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  20.Admission 12/02/2017 with Bacteroides bacteremia  Biliary obstruction documented on CT/MRI 12/02/2017  Upper endoscopy 12/05/2017  revealed angulation of the apparent limb precluding intubation  Status post placement of a biliary drain 12/05/2017, replaced 12/09/2017  Bile duct brushings 12/09/2017-negative for malignancy  Biliary drain cholangiogram 12/18/2017-there is patency of the biliary-enteric anastomosis with no contrast traverses the afferentlimb into the jejunum  Jejunal stent placed 01/28/2018, brush biopsy negative for malignancy  Biliary drain removed 02/11/2018  21.Admission 12/29/2017 through 01/02/2018 with sepsis secondary to Klebsiella bacteremia/cholangitis. 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  22.Right pelvicaliectasis on the PET scan 01/16/2018 with perinephric soft tissue fullness, the right ureter is not dilated 23.  Admission 04/28/2018 with nausea/vomiting, increased abdominal pain, and a headache 24.  Elevated liver enzymes on admission 04/28/2018-etiology unclear, improved 25.  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  The liver enzymes were improved again last night.  The etiology of the elevated liver enzymes is unclear.  She does not have symptoms to suggest an infection.  A blood culture remains negative.  I have a low clinical suspicion for a Port-A-Cath infection.  We can follow-up on the hemoglobin today and transfuse packed red blood cells  as indicated.  Evaluation for GI bleeding if she is documented to have blood in the stool.  Recommendations: 1.  Continue narcotic analgesics as needed for pain 2.  Follow-up hemoglobin today, consider GI valuation for gross bleeding 3.  Outpatient follow-up as scheduled at the Cancer center      LOS: 2 days   Betsy Coder, MD   04/30/2018, 8:11 AM

## 2018-04-30 NOTE — Progress Notes (Signed)
These preliminary result these preliminary results were noted.  Awaiting final report.

## 2018-04-30 NOTE — Progress Notes (Signed)
PT Cancellation Note  Patient Details Name: [name] MRN: [id number] DOB: 1946-03-15   Cancelled Treatment:    Reason Eval/Treat Not Completed: Other (comment).  Pt has been up walking on the hallways and so declined therapy.  Nursing can validate this, and pt agreed to let PT come by tomorrow if still here to ck on how she is mobilizing.  DC PT if no further needs are identified.   Ramond Dial 04/30/2018, 3:09 PM   Mee Hives, PT MS Acute Rehab Dept. Number: [place] and [place]

## 2018-04-30 NOTE — Progress Notes (Signed)
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 fib, obesity, sleep apnea, PE and hypertension, who presented to the ER on 04/28/2018 with nausea and vomiting & abdominal pain. Last chemotherapy last on 04/22/2018 Found to have elevated LFTs and WBC count in the 20s and admitted for possible cholangitis and started on Zosyn. GI consulted. GI subsequently requested an ID eval.   chemotherapy, with port, GNR bacteremia in April when she had cholangitis, now admitted with severe abdominal pain and obstructive hepatitis. She has leukocytosis and malaise, and fatigue but no fever.     Subjective: Continues to require pain medications for abdominal pain,, very emotional this am,  daughter and husband at bedside    Assessment & Plan:   1)Elevated LFTs, leukocytosis and abdominal pain- suspect cholangitis, Dr. Lyndel Safe recommends 10 to 14 days of antibiotics, may switch to p.o. Augmentin upon discharge home, continue IV Zosyn for now .  ID consult from Dr. Drucilla Schmidt noted - ? Cholangitis- initial stent placed in 9/15  - cont full liquid diet, PRN Morphine, Zofran and Hydrocodone Dr. Learta Codding the oncologist does not believe that the hepatic artery disease source of infection, blood cultures negative to date, leukocytosis noted however patient got a shot of Neulasta on 04/23/2018  2)  HTN (hypertension)/ PAF--continue Toprol with hold parameters due to concerns about bradycardia, continue flecainide, continue to hold Xarelto just in case patient needs procedures  3) Adenocarcinoma of head of pancreas - - Dr Ammie Dalton input and consult appreciated following   4) Protein-calorie malnutrition, severe  - cont  supplements recommended by nutrionist      DVT prophylaxis: SCDs Code Status: full code Family Communication: Husband and daughter at bedside Disposition Plan: f/u on LFTs and pain Consultants:   Medical oncology  GI  ID Procedures:     Antimicrobials:  Anti-infectives (From admission, onward)   Start     Dose/Rate Route Frequency Ordered Stop   04/29/18 0200  piperacillin-tazobactam (ZOSYN) IVPB 3.375 g     3.375 g 12.5 mL/hr over 240 Minutes Intravenous Every 8 hours 04/29/18 0149         Objective: Vitals:   04/29/18 1457 04/29/18 2049 04/30/18 0539 04/30/18 1424  BP: 120/65 118/85 123/71 107/67  Pulse: (!) 57 61 64 (!) 55  Resp: 17 16 16 16   Temp: 98 F (36.7 C) 98.3 F (36.8 C) 98 F (36.7 C) 98.1 F (36.7 C)  TempSrc: Oral Oral Oral Oral  SpO2: 99% 98% 98% 99%    Intake/Output Summary (Last 24 hours) at 04/30/2018 1845 Last data filed at 04/30/2018 1755 Gross per 24 hour  Intake 358.05 ml  Output -  Net 358.05 ml   There were no vitals filed for this visit.  Examination: General exam: Appears comfortable  HEENT: PERRLA, oral mucosa moist, no sclera icterus or thrush Respiratory system: Clear to auscultation. Respiratory effort normal. Cardiovascular system: S1 & S2 heard, RRR.  Left subclavian area Port-A-Cath site is clean dry and intact with no obvious signs of infection Gastrointestinal system: Abdomen soft, mild epigastric tenderness without rebound or guarding, abdomen is nondistended bowel sounds are good  Central nervous system: Alert and oriented. No focal neurological deficits. Extremities: No cyanosis, clubbing or  edema Skin: No rashes or ulcers Psychiatry:  Mood & affect appropriate.   CBC: Recent Labs  Lab 04/26/18 1920 04/28/18 1417 04/29/18 0500 04/30/18 0806  WBC 20.8* 28.8* 24.4* 26.5*  NEUTROABS  --  26.1*  --   --   HGB 8.6* 8.8* 8.1* 8.9*  HCT 26.1* 26.6* 23.8* 25.8*  MCV 90.0 87.4 86.5 85.4  PLT 411* 340 273 149    Basic Metabolic Panel: Recent Labs  Lab 04/26/18 1920 04/28/18 1417 04/29/18 0500 04/29/18 2030  NA 130* 133* 136 137  K 3.9 4.6 3.7 3.5  CL 98 101 105 105  CO2 19* 22 23 22   GLUCOSE 92 102* 96 133*  BUN 15 22 18 15   CREATININE 1.03* 1.19* 0.99 1.19*  CALCIUM 8.8* 8.9 8.4* 8.5*  MG  --   --  1.9  --   PHOS  --   --  3.6  --    GFR: Estimated Creatinine Clearance: 34.3 mL/min (A) (by C-G formula based on SCr of 1.19 mg/dL (H)). Liver Function Tests: Recent Labs  Lab 04/26/18 1920 04/28/18 1417 04/29/18 0500 04/29/18 2030  AST 41 440* 244* 156*  ALT 29 213* 156* 126*  ALKPHOS 318* 922* 702* 657*  BILITOT 0.8 1.3* 0.7 0.6  PROT 6.6 6.7 6.0* 6.0*  ALBUMIN 3.1* 3.1* 3.0* 3.1*   Recent Labs  Lab 04/26/18 1920  LIPASE 29   No results for input(s): AMMONIA in the last 168 hours. Coagulation Profile: No results for input(s): INR, PROTIME in the last 168 hours. Cardiac Enzymes: No results for input(s): CKTOTAL, CKMB, CKMBINDEX, TROPONINI in the last 168 hours. BNP (last 3 results) Recent Labs    12/19/17 1257  PROBNP 103   HbA1C: No results for input(s): HGBA1C in the last 72 hours. CBG: No results for input(s): GLUCAP in the last 168 hours. Lipid Profile: No results for input(s): CHOL, HDL, LDLCALC, TRIG, CHOLHDL, LDLDIRECT in the last 72 hours. Thyroid Function Tests: Recent Labs    04/29/18 1510  TSH 3.369   Anemia Panel: No results for input(s): VITAMINB12, FOLATE, FERRITIN, TIBC, IRON, RETICCTPCT in the last 72 hours. Urine analysis:    Component Value Date/Time   COLORURINE STRAW (A) 04/26/2018 1939   APPEARANCEUR CLEAR 04/26/2018 1939   LABSPEC 1.004 (L) 04/26/2018 1939   LABSPEC 1.005 04/21/2014 1713   PHURINE 5.0 04/26/2018 1939   GLUCOSEU NEGATIVE 04/26/2018 1939   GLUCOSEU Negative 04/21/2014 1713   HGBUR NEGATIVE 04/26/2018 1939   BILIRUBINUR NEGATIVE 04/26/2018 1939   BILIRUBINUR Negative 04/21/2014 1713   KETONESUR NEGATIVE  04/26/2018 1939   PROTEINUR NEGATIVE 04/26/2018 1939   UROBILINOGEN 0.2 10/16/2014 1035   UROBILINOGEN 0.2 04/21/2014 1713   NITRITE NEGATIVE 04/26/2018 1939   LEUKOCYTESUR NEGATIVE 04/26/2018 1939   LEUKOCYTESUR Trace 04/21/2014 1713   Sepsis Labs: @LABRCNTIP (procalcitonin:4,lacticidven:4) ) Recent Results (from the past 240 hour(s))  Culture, Blood     Status: None (Preliminary result)   Collection Time: 04/28/18  4:07 PM  Result Value Ref Range Status   Specimen Description   Final    PORTA CATH Performed at [facility], [place] [address] [place], [place] [address]    Special Requests   Final    BOTTLES DRAWN AEROBIC AND ANAEROBIC Blood Culture adequate volume   Culture   Final    NO GROWTH 2 DAYS Performed at [facility], [place] [address], [place],  [address]    Report Status PENDING  Incomplete  Radiology Studies: Ct Head W & Wo Contrast  Result Date: 04/29/2018 CLINICAL DATA:  Headaches for 7 days. History of pancreatic cancer and endometrial cancer. Restaging. EXAM: CT HEAD WITHOUT AND WITH CONTRAST TECHNIQUE: Contiguous axial images were obtained from the base of the skull through the vertex without and with intravenous contrast CONTRAST:  50mL OMNIPAQUE IOHEXOL 300 MG/ML  SOLN COMPARISON:  PET scan 01/16/2018 FINDINGS: Brain: No evidence for acute infarction, hemorrhage, mass lesion, hydrocephalus, or extra-axial fluid. Normal for age cerebral volume. Slight hypoattenuation of white matter, favored to represent post treatment effect/small vessel disease Post infusion, no abnormal enhancement of the brain or meninges. Vascular: Calcification of the cavernous internal carotid arteries consistent with cerebrovascular atherosclerotic disease. No signs of intracranial large vessel occlusion. Skull: Calvarium intact. Sinuses/Orbits: Negative sinuses.  No orbital abnormality. Other: None. IMPRESSION: No evidence of intracranial metastatic  disease. No cause for the reported symptoms is identified. Electronically Signed   By: Staci Righter M.D.   On: 04/29/2018 11:12      Scheduled Meds: . feeding supplement  1 Container Oral TID BM  . flecainide  100 mg Oral BID  . metoprolol succinate  25 mg Oral Daily  . polyethylene glycol  17 g Oral Daily  . senna-docusate  2 tablet Oral QHS   Continuous Infusions: . sodium chloride Stopped (04/29/18 0259)  . piperacillin-tazobactam (ZOSYN)  IV 12.5 mL/hr at 04/30/18 1755     LOS: 2 days     Roxan Hockey, MD [facility] Pager: [email or web address] Password [facility] 04/30/2018, 6:45 PM

## 2018-04-30 NOTE — Progress Notes (Addendum)
Progress Note   Subjective  Chief Complaint: Abdominal pain, question cholangitis  Patient is emotionally distressed this morning.  She is crying throughout time of my interview with frustration.  Continues with episodic epigastric pain which seems to radiate into her esophagus.  Also asked questions regarding where the infection is coming from.  Tells me she has heard it "may be my port".  Patient wants this taken care of if this is the case.  Again is very distressed.   Objective   Vital signs in last 24 hours: Temp:  [98 F (36.7 C)-98.3 F (36.8 C)] 98 F (36.7 C) (08/08 0539) Pulse Rate:  [53-64] 64 (08/08 0539) Resp:  [16-17] 16 (08/08 0539) BP: (104-123)/(65-85) 123/71 (08/08 0539) SpO2:  [98 %-99 %] 98 % (08/08 0539) Last BM Date: 04/29/18 General:    white female in NAD Heart:  Regular rate and rhythm; no murmurs Lungs: Respirations even and unlabored, lungs CTA bilaterally Abdomen:  Soft, moderate epigastric ttp and nondistended. Normal bowel sounds. Extremities:  Without edema. Neurologic:  Alert and oriented,  grossly normal neurologically. Psych:  Cooperative. Normal mood and affect.  Intake/Output from previous day: 08/07 0701 - 08/08 0700 In: 442.9 [P.O.:360; IV Piggyback:82.9] Out: 1400 [Urine:1400]  Lab Results: Recent Labs    04/28/18 1417 04/29/18 0500 04/30/18 0806  WBC 28.8* 24.4* 26.5*  HGB 8.8* 8.1* 8.9*  HCT 26.6* 23.8* 25.8*  PLT 340 273 287   BMET Recent Labs    04/28/18 1417 04/29/18 0500 04/29/18 2030  NA 133* 136 137  K 4.6 3.7 3.5  CL 101 105 105  CO2 22 23 22   GLUCOSE 102* 96 133*  BUN 22 18 15   CREATININE 1.19* 0.99 1.19*  CALCIUM 8.9 8.4* 8.5*   LFT Recent Labs    04/29/18 2030  PROT 6.0*  ALBUMIN 3.1*  AST 156*  ALT 126*  ALKPHOS 657*  BILITOT 0.6   Studies/Results: Ct Head W & Wo Contrast  Result Date: 04/29/2018 CLINICAL DATA:  Headaches for 7 days. History of pancreatic cancer and endometrial cancer.  Restaging. EXAM: CT HEAD WITHOUT AND WITH CONTRAST TECHNIQUE: Contiguous axial images were obtained from the base of the skull through the vertex without and with intravenous contrast CONTRAST:  58mL OMNIPAQUE IOHEXOL 300 MG/ML  SOLN COMPARISON:  PET scan 01/16/2018 FINDINGS: Brain: No evidence for acute infarction, hemorrhage, mass lesion, hydrocephalus, or extra-axial fluid. Normal for age cerebral volume. Slight hypoattenuation of white matter, favored to represent post treatment effect/small vessel disease Post infusion, no abnormal enhancement of the brain or meninges. Vascular: Calcification of the cavernous internal carotid arteries consistent with cerebrovascular atherosclerotic disease. No signs of intracranial large vessel occlusion. Skull: Calvarium intact. Sinuses/Orbits: Negative sinuses.  No orbital abnormality. Other: None. IMPRESSION: No evidence of intracranial metastatic disease. No cause for the reported symptoms is identified. Electronically Signed   By: Staci Righter M.D.   On: 04/29/2018 11:12     Assessment / Plan:   Assessment: 1.  Pancreatic cancer status post Whipple resection choledochojejunostomy: Status post internal/external biliary drain by IR in April, status post removal in May 2019, also a jejunal stent placement at site of tumor invasion, recent CT abdomen pelvis H/4/19 shows decreased CBD dilation and decreased burden, again clinical picture does not fit with cholangitis entirely, patient has a Port-A-Cath history for the last 4 years and this may be source of sepsis? 2.  Elevated LFTs: Trending down 3.  Abdominal pain: Epigastric with nausea and vomiting  4.  A. fib: On Xarelto, last dose 04/27/2018 5.  Leukocytosis: Worsening today 24.4-->28.8  Plan: 1.  Discussed case again with Dr. Lyndel Safe.  Discussed EGD.  Patient has just had an EGD in March of this year with no findings other than altered anatomy from Whipple and question of afferent loop syndrome.  Patient would  likely not benefit from repeat EGD.  I will leave this for Dr. Lyndel Safe to discuss again later today. 2.  Added GI cocktail today. 3.  Leukocytosis seems to worsen overnight, will leave this to infectious disease 4.  LFTs are trending down. 5.  Please await any further recommendations from Dr. Lyndel Safe later today.  Thank you for your kind consultation, we will continue to follow.   LOS: 2 days   Levin Erp  04/30/2018, 10:08 AM   Attending physician's note   I have taken an interval history, reviewed the chart and examined the patient. I agree with the Advanced Practitioner's note, impression and recommendations.   Patient doing well this afternoon.  Advance diet.  Liver tests trending down.  Oncology does not feel that Port-A-Cath is the source of infection.  She has responded well to antibiotics.  Wants to avoid percutaneous biliary drain.  She does understand that if she has another episode of cholangitis, may require it.  Recommend to continue antibiotics for 10 to 14 days.    Carmell Austria, MD

## 2018-04-30 NOTE — Progress Notes (Signed)
Subjective: No new complaints   Antibiotics:  Anti-infectives (From admission, onward)   Start     Dose/Rate Route Frequency Ordered Stop   04/29/18 0200  piperacillin-tazobactam (ZOSYN) IVPB 3.375 g     3.375 g 12.5 mL/hr over 240 Minutes Intravenous Every 8 hours 04/29/18 0149        Medications: Scheduled Meds: . feeding supplement  1 Container Oral TID BM  . flecainide  100 mg Oral BID  . metoprolol succinate  25 mg Oral Daily   Continuous Infusions: . sodium chloride Stopped (04/29/18 0259)  . piperacillin-tazobactam (ZOSYN)  IV 12.5 mL/hr at 04/30/18 0800   PRN Meds:.sodium chloride, acetaminophen **OR** acetaminophen, albuterol, gi cocktail, HYDROcodone-acetaminophen, morphine injection, ondansetron **OR** ondansetron (ZOFRAN) IV, zolpidem    Objective: Weight change:   Intake/Output Summary (Last 24 hours) at 04/30/2018 1153 Last data filed at 04/29/2018 1954 Gross per 24 hour  Intake 442.9 ml  Output 700 ml  Net -257.1 ml   Blood pressure 123/71, pulse 64, temperature 98 F (36.7 C), temperature source Oral, resp. rate 16, SpO2 98 %. Temp:  [98 F (36.7 C)-98.3 F (36.8 C)] 98 F (36.7 C) (08/08 0539) Pulse Rate:  [53-64] 64 (08/08 0539) Resp:  [16-17] 16 (08/08 0539) BP: (104-123)/(65-85) 123/71 (08/08 0539) SpO2:  [98 %-99 %] 98 % (08/08 0539)  Physical Exam: General: Alert and awake, oriented x3, not in any acute distress. HEENT: anicteric sclera, EOMI CVS regular rate, normal  Chest: , no wheezing, no respiratory distress Abdomen: less tender Extremities: no edema or deformity noted bilaterally Skin: no rashes, no change in appeararance of port Neuro: nonfocal  CBC:    BMET Recent Labs    04/29/18 0500 04/29/18 2030  NA 136 137  K 3.7 3.5  CL 105 105  CO2 23 22  GLUCOSE 96 133*  BUN 18 15  CREATININE 0.99 1.19*  CALCIUM 8.4* 8.5*     Liver Panel  Recent Labs    04/29/18 0500 04/29/18 2030  PROT 6.0* 6.0*    ALBUMIN 3.0* 3.1*  AST 244* 156*  ALT 156* 126*  ALKPHOS 702* 657*  BILITOT 0.7 0.6       Sedimentation Rate No results for input(s): ESRSEDRATE in the last 72 hours. C-Reactive Protein No results for input(s): CRP in the last 72 hours.  Micro Results: Recent Results (from the past 720 hour(s))  Culture, Blood     Status: None (Preliminary result)   Collection Time: 04/28/18  4:07 PM  Result Value Ref Range Status   Specimen Description   Final    PORTA CATH Performed at [facility], [address], [place], [place] [address]    Special Requests   Final    BOTTLES DRAWN AEROBIC AND ANAEROBIC Blood Culture adequate volume   Culture   Final    NO GROWTH < 24 HOURS Performed at [facility], [place] [address] [place], [place] [address]    Report Status PENDING  Incomplete    Studies/Results: Ct Head W & Wo Contrast  Result Date: 04/29/2018 CLINICAL DATA:  Headaches for 7 days. History of pancreatic cancer and endometrial cancer. Restaging. EXAM: CT HEAD WITHOUT AND WITH CONTRAST TECHNIQUE: Contiguous axial images were obtained from the base of the skull through the vertex without and with intravenous contrast CONTRAST:  44mL OMNIPAQUE IOHEXOL 300 MG/ML  SOLN COMPARISON:  PET scan 01/16/2018 FINDINGS: Brain: No evidence for acute infarction, hemorrhage, mass  lesion, hydrocephalus, or extra-axial fluid. Normal for age cerebral volume. Slight hypoattenuation of white matter, favored to represent post treatment effect/small vessel disease Post infusion, no abnormal enhancement of the brain or meninges. Vascular: Calcification of the cavernous internal carotid arteries consistent with cerebrovascular atherosclerotic disease. No signs of intracranial large vessel occlusion. Skull: Calvarium intact. Sinuses/Orbits: Negative sinuses.  No orbital abnormality. Other: None. IMPRESSION: No evidence of intracranial metastatic disease. No cause for the reported  symptoms is identified. Electronically Signed   By: Staci Righter M.D.   On: 04/29/2018 11:12      Assessment/Plan:  INTERVAL HISTORY: blood cultures remain negative LFTS better   Principal Problem:   Cholangiolitis Active Problems:   HTN (hypertension)   OSA (obstructive sleep apnea)   Adenocarcinoma of head of pancreas (HCC)   Protein-calorie malnutrition, severe (HCC)   Dehydration   Paroxysmal atrial fibrillation (HCC)   Orthostatic hypotension   Elevated LFTs   Malnutrition of moderate degree   Bacteremia due to Klebsiella pneumoniae   Infection of venous access port   Leucopenia    [name] is a 72 y.o. female with  Stage IV adenocarcinoma of pancreas sp Whipples, chemotherapy, with port, GNR bacteremia in April when she had cholangitis, now admitted with severe abdominal pain and obstructive hepatitis. She has leukocytosis and malaise, and fatigue but no fever. She received neulasta with last dose of chemotherapy.  #1 Possible cholangitis: fine to continue zosyn for now. I would not likely give her more than 5-7 (favor the shorter course of abx) days of antibiotics. Could finish her course with augmentin if when she is well enough for DC  #2 Port in place when pt was bacteremic: blood cultures this admission NGTD. Dr. [name] does not have high suspicion for infection. Given pt has high frequency contact with him and there should be ample opportunity for her to communicte symptoms such as fever she will be watched closely  I will sign off for now.  Please call with further questions.     LOS: 2 days   Alcide Evener 04/30/2018, 11:53 AM

## 2018-05-01 ENCOUNTER — Ambulatory Visit: Payer: Medicare Other

## 2018-05-01 ENCOUNTER — Other Ambulatory Visit: Payer: Self-pay

## 2018-05-01 DIAGNOSIS — T80218A Other infection due to central venous catheter, initial encounter: Secondary | ICD-10-CM

## 2018-05-01 DIAGNOSIS — R7881 Bacteremia: Secondary | ICD-10-CM

## 2018-05-01 DIAGNOSIS — Z978 Presence of other specified devices: Secondary | ICD-10-CM

## 2018-05-01 DIAGNOSIS — B961 Klebsiella pneumoniae [K. pneumoniae] as the cause of diseases classified elsewhere: Secondary | ICD-10-CM

## 2018-05-01 LAB — BLOOD CULTURE ID PANEL (REFLEXED)
Acinetobacter baumannii: NOT DETECTED
CANDIDA PARAPSILOSIS: NOT DETECTED
Candida albicans: NOT DETECTED
Candida glabrata: NOT DETECTED
Candida krusei: NOT DETECTED
Candida tropicalis: NOT DETECTED
Carbapenem resistance: NOT DETECTED
ENTEROBACTER CLOACAE COMPLEX: NOT DETECTED
ENTEROBACTERIACEAE SPECIES: DETECTED — AB
Enterococcus species: NOT DETECTED
Escherichia coli: NOT DETECTED
HAEMOPHILUS INFLUENZAE: NOT DETECTED
KLEBSIELLA OXYTOCA: NOT DETECTED
KLEBSIELLA PNEUMONIAE: DETECTED — AB
Listeria monocytogenes: NOT DETECTED
Neisseria meningitidis: NOT DETECTED
PSEUDOMONAS AERUGINOSA: NOT DETECTED
Proteus species: NOT DETECTED
STREPTOCOCCUS PNEUMONIAE: NOT DETECTED
STREPTOCOCCUS PYOGENES: NOT DETECTED
STREPTOCOCCUS SPECIES: NOT DETECTED
Serratia marcescens: NOT DETECTED
Staphylococcus aureus (BCID): NOT DETECTED
Staphylococcus species: NOT DETECTED
Streptococcus agalactiae: NOT DETECTED

## 2018-05-01 LAB — HEPATITIS C ANTIBODY (REFLEX): HCV Ab: <0.1 s/co ratio (ref 0.0–0.9)

## 2018-05-01 LAB — HCV COMMENT:

## 2018-05-01 NOTE — Progress Notes (Addendum)
PROGRESS NOTE    VERGENE MARLAND   PTW:[id number]  DOB: [birth year]  DOA: 04/28/2018 PCP: Chesley Noon, MD   Brief Narrative:  [name] is a 72 y/o female with pancreatic cancer status post Whipple resection and choledochojejunostomy, internal/external biliary drain on 12/05/2017, small bowel stent placement May 2019, A. fib, obesity, sleep apnea, PE and hypertension, who presented to the ER on 04/28/2018 with nausea and vomiting & abdominal pain. Last chemotherapy last on 04/22/2018 Found to have elevated LFTs and WBC count in the 20s and admitted for possible cholangitis and started on Zosyn. GI consulted. GI subsequently requested an ID eval.   Subjective: She has no complaints today.     Assessment & Plan:   Principal Problem: Elevated LFTs, leukocytosis and abdominal pain - Klebsiella bacteremia - she previously had K pneumoniae bacteremia in 4/19 - ? Cholangitis- initial stent placed in 9/15 - ID recommends port to be removed - cont anibiotoics per ID-  Dr Drucilla Schmidt is recommending PICC after catheter holiday and subsequently 2 wk course of IV treament - Gen surgery will remove port tomorrow -  appreciate ID and GI evals - cont full liquid diet, PRN Morphine, Zofran and Hydrocodone  Active Problems:     HTN (hypertension)/ PAF - HR in 50s- Toprol with holding parameters -  Xarelto on hold for possible procedures - most likely can resume after port removal tomorrow - cont Flecainide    Adenocarcinoma of head of pancreas  - Dr Ammie Dalton has evaluated the patient and is following    Protein-calorie malnutrition, severe  - cont supplements recommended by nutrionist    DVT prophylaxis: SCDs Code Status: full code Family Communication:  Disposition Plan:  Consultants:   Medical oncology  GI  ID Procedures:     Antimicrobials:  Anti-infectives (From admission, onward)   Start     Dose/Rate Route Frequency Ordered Stop   04/29/18 0200   piperacillin-tazobactam (ZOSYN) IVPB 3.375 g     3.375 g 12.5 mL/hr over 240 Minutes Intravenous Every 8 hours 04/29/18 0149         Objective: Vitals:   04/30/18 1424 04/30/18 2044 05/01/18 0443 05/01/18 1435  BP: 107/67 117/76 132/86 114/79  Pulse: (!) 55 60 60 (!) 117  Resp: 16 18 12 18   Temp: 98.1 F (36.7 C) 98 F (36.7 C) 97.9 F (36.6 C) 98 F (36.7 C)  TempSrc: Oral Oral Oral Oral  SpO2: 99% 100% 100% 100%    Intake/Output Summary (Last 24 hours) at 05/01/2018 1711 Last data filed at 05/01/2018 0954 Gross per 24 hour  Intake 540.04 ml  Output 2 ml  Net 538.04 ml   There were no vitals filed for this visit.  Examination: General exam: Appears comfortable  HEENT: PERRLA, oral mucosa moist, no sclera icterus or thrush Respiratory system: Clear to auscultation. Respiratory effort normal. Cardiovascular system: S1 & S2 heard, RRR.   Gastrointestinal system: Abdomen soft,  Tenderness in epigastrium, nondistended. Normal bowel sound. No organomegaly Central nervous system: Alert and oriented. No focal neurological deficits. Extremities: No cyanosis, clubbing or edema Skin: No rashes or ulcers Psychiatry:  Mood & affect appropriate.     Data Reviewed: I have personally reviewed following labs and imaging studies  CBC: Recent Labs  Lab 04/26/18 1920 04/28/18 1417 04/29/18 0500 04/30/18 0806  WBC 20.8* 28.8* 24.4* 26.5*  NEUTROABS  --  26.1*  --   --   HGB 8.6* 8.8* 8.1* 8.9*  HCT 26.1* 26.6* 23.8*  25.8*  MCV 90.0 87.4 86.5 85.4  PLT 411* 340 273 222   Basic Metabolic Panel: Recent Labs  Lab 04/26/18 1920 04/28/18 1417 04/29/18 0500 04/29/18 2030  NA 130* 133* 136 137  K 3.9 4.6 3.7 3.5  CL 98 101 105 105  CO2 19* 22 23 22   GLUCOSE 92 102* 96 133*  BUN 15 22 18 15   CREATININE 1.03* 1.19* 0.99 1.19*  CALCIUM 8.8* 8.9 8.4* 8.5*  MG  --   --  1.9  --   PHOS  --   --  3.6  --    GFR: Estimated Creatinine Clearance: 34.3 mL/min (A) (by C-G formula  based on SCr of 1.19 mg/dL (H)). Liver Function Tests: Recent Labs  Lab 04/26/18 1920 04/28/18 1417 04/29/18 0500 04/29/18 2030  AST 41 440* 244* 156*  ALT 29 213* 156* 126*  ALKPHOS 318* 922* 702* 657*  BILITOT 0.8 1.3* 0.7 0.6  PROT 6.6 6.7 6.0* 6.0*  ALBUMIN 3.1* 3.1* 3.0* 3.1*   Recent Labs  Lab 04/26/18 1920  LIPASE 29   No results for input(s): AMMONIA in the last 168 hours. Coagulation Profile: No results for input(s): INR, PROTIME in the last 168 hours. Cardiac Enzymes: No results for input(s): CKTOTAL, CKMB, CKMBINDEX, TROPONINI in the last 168 hours. BNP (last 3 results) Recent Labs    12/19/17 1257  PROBNP 103   HbA1C: No results for input(s): HGBA1C in the last 72 hours. CBG: No results for input(s): GLUCAP in the last 168 hours. Lipid Profile: No results for input(s): CHOL, HDL, LDLCALC, TRIG, CHOLHDL, LDLDIRECT in the last 72 hours. Thyroid Function Tests: Recent Labs    04/29/18 1510  TSH 3.369   Anemia Panel: No results for input(s): VITAMINB12, FOLATE, FERRITIN, TIBC, IRON, RETICCTPCT in the last 72 hours. Urine analysis:    Component Value Date/Time   COLORURINE STRAW (A) 04/26/2018 1939   APPEARANCEUR CLEAR 04/26/2018 1939   LABSPEC 1.004 (L) 04/26/2018 1939   LABSPEC 1.005 04/21/2014 1713   PHURINE 5.0 04/26/2018 1939   GLUCOSEU NEGATIVE 04/26/2018 1939   GLUCOSEU Negative 04/21/2014 1713   HGBUR NEGATIVE 04/26/2018 1939   BILIRUBINUR NEGATIVE 04/26/2018 1939   BILIRUBINUR Negative 04/21/2014 1713   KETONESUR NEGATIVE 04/26/2018 1939   PROTEINUR NEGATIVE 04/26/2018 1939   UROBILINOGEN 0.2 10/16/2014 1035   UROBILINOGEN 0.2 04/21/2014 1713   NITRITE NEGATIVE 04/26/2018 1939   LEUKOCYTESUR NEGATIVE 04/26/2018 1939   LEUKOCYTESUR Trace 04/21/2014 1713   Sepsis Labs: @LABRCNTIP (procalcitonin:4,lacticidven:4) ) Recent Results (from the past 240 hour(s))  Culture, Blood     Status: None (Preliminary result)   Collection Time:  04/28/18  4:07 PM  Result Value Ref Range Status   Specimen Description   Final    PORTA CATH Performed at [facility], [place] [address] [place], [place] [address]    Special Requests   Final    BOTTLES DRAWN AEROBIC AND ANAEROBIC Blood Culture adequate volume   Culture  Setup Time   Final    GRAM NEGATIVE RODS AEROBIC BOTTLE ONLY Organism ID to follow CRITICAL RESULT CALLED TO, READ BACK BY AND VERIFIED WITHChristean Grief [facility] [id number] 05/01/18 A BROWNING    Culture   Final    NO GROWTH 3 DAYS Performed at [facility], [place] [address], [place], [place] [address]    Report Status PENDING  Incomplete  Blood Culture ID Panel (Reflexed)     Status: Abnormal   Collection Time: 04/28/18  4:07 PM  Result Value Ref Range Status   Enterococcus species NOT DETECTED NOT DETECTED Final   Listeria monocytogenes NOT DETECTED NOT DETECTED Final   Staphylococcus species NOT DETECTED NOT DETECTED Final   Staphylococcus aureus NOT DETECTED NOT DETECTED Final   Streptococcus species NOT DETECTED NOT DETECTED Final   Streptococcus agalactiae NOT DETECTED NOT DETECTED Final   Streptococcus pneumoniae NOT DETECTED NOT DETECTED Final   Streptococcus pyogenes NOT DETECTED NOT DETECTED Final   Acinetobacter baumannii NOT DETECTED NOT DETECTED Final   Enterobacteriaceae species DETECTED (A) NOT DETECTED Final    Comment: Enterobacteriaceae represent a large family of gram-negative bacteria, not a single organism. CRITICAL RESULT CALLED TO, READ BACK BY AND VERIFIED WITH: Christean Grief [facility] [id number] 05/01/18 A BROWNING    Enterobacter cloacae complex NOT DETECTED NOT DETECTED Final   Escherichia coli NOT DETECTED NOT DETECTED Final   Klebsiella oxytoca NOT DETECTED NOT DETECTED Final   Klebsiella pneumoniae DETECTED (A) NOT DETECTED Final    Comment: CRITICAL RESULT CALLED TO, READ BACK BY AND VERIFIED WITH: Christean Grief PHARMD [id number] 05/01/18 A BROWNING    Proteus species NOT DETECTED NOT  DETECTED Final   Serratia marcescens NOT DETECTED NOT DETECTED Final   Carbapenem resistance NOT DETECTED NOT DETECTED Final   Haemophilus influenzae NOT DETECTED NOT DETECTED Final   Neisseria meningitidis NOT DETECTED NOT DETECTED Final   Pseudomonas aeruginosa NOT DETECTED NOT DETECTED Final   Candida albicans NOT DETECTED NOT DETECTED Final   Candida glabrata NOT DETECTED NOT DETECTED Final   Candida krusei NOT DETECTED NOT DETECTED Final   Candida parapsilosis NOT DETECTED NOT DETECTED Final   Candida tropicalis NOT DETECTED NOT DETECTED Final    Comment: Performed at [facility], [place] [address] [place], [place] [address]         Radiology Studies: No results found.    Scheduled Meds: . feeding supplement  1 Container Oral TID BM  . flecainide  100 mg Oral BID  . metoprolol succinate  25 mg Oral Daily  . polyethylene glycol  17 g Oral Daily  . senna-docusate  2 tablet Oral QHS   Continuous Infusions: . sodium chloride Stopped (04/29/18 0259)  . piperacillin-tazobactam (ZOSYN)  IV 3.375 g (05/01/18 1544)     LOS: 3 days    Time spent in minutes: 35    Debbe Odea, MD [facility] Pager: [email or web address] Password [email or web address] 05/01/2018, 5:11 PM

## 2018-05-01 NOTE — Care Management Important Message (Signed)
Important Message  Patient Details  Name: [name] MRN: [id number] Date of Birth: [birth year]   Medicare Important Message Given:       Kerin Salen 05/01/2018, 10:22 [place] Message  Patient Details  Name: [name] MRN: [id number] Date of Birth: 1945-09-28   Medicare Important Message Given:       Kerin Salen 05/01/2018, 10:21 AM

## 2018-05-01 NOTE — Progress Notes (Signed)
These preliminary result these preliminary results were noted.  Awaiting final report.

## 2018-05-01 NOTE — Progress Notes (Signed)
IP PROGRESS NOTE  Subjective:   [name] continues to have abdominal pain and a headache.  Nausea has improved. Objective: Vital signs in last 24 hours: Blood pressure 132/86, pulse 60, temperature 97.9 F (36.6 C), temperature source Oral, resp. rate 12, SpO2 100 %.  Intake/Output from previous day: 08/08 0701 - 08/09 0700 In: 755.5 [P.O.:400; IV Piggyback:355.5] Out: 3 [Urine:3]  Physical Exam:  HEENT: No thrush or ulcers  Abdomen: Soft, tender throughout the upper abdomen Extremities: No leg edema   Portacath/PICC-without erythema, no tenderness along the subcutaneous tract or surrounding the Port-A-Cath Lab Results: Recent Labs    04/29/18 0500 04/30/18 0806  WBC 24.4* 26.5*  HGB 8.1* 8.9*  HCT 23.8* 25.8*  PLT 273 287    BMET Recent Labs    04/29/18 0500 04/29/18 2030  NA 136 137  K 3.7 3.5  CL 105 105  CO2 23 22  GLUCOSE 96 133*  BUN 18 15  CREATININE 0.99 1.19*  CALCIUM 8.4* 8.5*  Alkaline phosphatase 702, AST 244, ALT 156, bilirubin 0.7  Medications: I have reviewed the patient's current medications.  Assessment/Plan:  1. 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  Gemcitabine held 11/02/2014 due to neutropenia.  Gemcitabine 11/09/2014 dose reduced 800 mg/m.  Gemcitabine held 11/16/2014 due to neutropenia.  Gemcitabine resumed 11/23/2014 every 2 week schedule.  Cycle 6 gemcitabine 01/05/2015  Cycle 7 gemcitabine 01/18/2015  Cycle 8 gemcitabine 02/01/2015  Cycle 9 gemcitabine 02/15/2015  Cycle 10 gemcitabine 03/01/2015  Cycle 11 gemcitabine 03/15/2015  Cycle 12 gemcitabine 03/29/2015  Elevated  CA 19-01 May 2016  CTs 06/17/2016-new soft tissue mass at the root of the mesentery with vascular involvement  PET 06/27/2016-hypermetabolic activity associated with soft tissue adjacent to surgical clips in the central mesentery  Status post SBRTto the mesenteric mass completed 07/26/2016  CT abdomen/pelvis 08/23/2016 -mesenteric mass stable to slightly decreased in size.  Cycle 1 FOLFOX 09/03/2016  Cycle 2 FOLFOX 09/30/2016  Cycle 3 FOLFOX 10/14/2016   Cycle 4 FOLFOX 11/05/2016 (5-FU bolus eliminated and 5-FU pump dose reduced)  Cycle 5 FOLFOX 11/19/2016  CT abdomen/pelvis 11/28/2016-decreased size of soft tissue at the small bowel mesentery, mild asymmetric soft tissue at the left vaginal cuff  Cycle 6 FOLFOX 12/10/2016 (5-FU infusion further reduced and oxaliplatin reduced)  Cycle 7 FOLFOX 12/31/2016  Cycle 8 FOLFOX 01/21/2017   CT 02/24/2017-slight decrease in size of the mesenteric mass, resolution of soft tissue fullness at the left vaginal cuff, no evidence of disease progression  CT abdomen/pelvis 06/20/2017-stable ill-defined soft tissue at the mesenteric root, mild increased asymmetry at the left vaginal cuff, no other evidence of disease progression  Elevated CA 19-9 09/11/2018  CT abdomen/pelvis 11/18/2017-no evidence of recurrent pancreas cancer, dilated afferentloop, intrahepatic biliary dilatation  PET scan 01/16/2018-low level hypermetabolic activity in the porta hepatis which appears to correspond with a mildly enlarged lymph node on MRI. No other suspicious nodal activity in the abdomen. Small right paratracheal and subcarinal lymph nodes with low-level hypermetabolic activity in the chest. No abnormal activity within the liver. Findings suspicious for obstruction at the right ureteropelvic junction. Asymmetric left oropharyngeal activity.  CT abdomen/pelvis 03/31/2018- significant increaseinright-sided retroperitoneal soft tissue which invades and  encases the IVC, celiac trunk,  SMA, duodenum and right renal pelvis. Interval progression of intrahepatic and common bile duct dilatation. New right-sided obstructive uropathy secondary to tumor invasion of the right renal pelvis.  Cycle 1 gemcitabine/Abraxane 04/08/2018  Cycle 2 gemcitabine/Abraxane 04/22/2018   2. bile duct obstruction secondary to #1, status post an ERCP with stent placement 09/23/2015hypermetabolic soft tissue in the central mesentery, no other evidence of metastatic disease, stable mildly enlarged portal caval node  3. Admission with post ERCP pancreatitis 06/16/2014  4. History of abdominal pain secondary to #1  5. Pulmonary embolism diagnosed on a CT of the abdomen 09/16/2014  Negative lower extremity Dopplers 09/17/2014  6. Multiple orthopedic surgical procedures  7. Endometrial cancer,stage IA, grade 1 endometrioid adenocarcinoma, 18% myometrial invasion, no lymphovascular space involvement, negative washings  Status post robotic total hysterectomy and bilateral salpingo-oophorectomy 11/30/2010  Recurrent tumor left lateral vagina status post biopsy 11/24/2014 with pathology confirming adenocarcinoma with focal squamous differentiation consistent with endometrial adenocarcinoma  Staging CT scans 12/06/2014 with no evidence of local pancreatic cancer recurrence. Small fluid collection adjacent to the left adrenal gland. Severe hepatic steatosis. No evidence of local extension of endometrial carcinoma. Carcinoma not well-defined at the vaginal cuff. 5 mm right external iliac lymph node. 3.6 mm left external iliac lymph node  Brachytherapy initiated 12/22/2014, completed 01/19/2015  CT abdomen/pelvis 07/24/2015 revealed a 3 x 4 cm soft tissue focus at the vaginal apex  PET scan 08/11/2015 revealed no mass at the vaginal apex and no evidence of metastatic disease  CT 11/28/2016-mild asymmetric soft tissue at the left vaginal cuff, resolved on  CT 02/24/2017  CT abdomen/pelvis 06/20/2017-stable mild ill-defined soft tissue density in the mesenteric root. Stable mild portacaval lymphadenopathy and subcentimeter right retroperitoneal lymph nodes. Mild increased size of asymmetric soft tissue density involving the left vaginal cuff.  PET scan 07/19/2017-no suspicious hypermetabolic activity within the neck, chest, abdomen or pelvis. Specifically no evidence of residual hypermetabolic tumor in the surgical bed or at the vaginal cuff. Hypermetabolic activity in the lumbar spine at the level of the right L3-4 facet joint appears degenerative.  MRI abdomen 12/02/2017-focal area of abnormal signal in the caudate lobe of the liver-unclear etiology, dilation of the hepaticojejunostomy loop, bile ducts, and pancreatic duct-stricture formation versus recurrent tumor  8. History of atrial fibrillation-maintained on xarelto  9. Family history of multiple cancers-negative CancerNext gene panel  10. Prolonged nausea following the pancreaticoduodenectomy. Improved 10/26/2014.  11. Port-A-Cath placement 10/21/2014.  12. History of Neutropenia secondary to chemotherapy   13. Diarrhea. Question pancreatic insufficiency. Pancreatic enzyme replacement initiated 01/05/2015. Recurrent diarrhea following a course of antibiotics March 2017.  14. History of positional vertigo-resolved  15. 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 Neutropenia secondary to chemotherapy, G-CSF was added with cycle 2 gemcitabine/Abraxane  17. Delayed nausea and diarrhea following FOLFOX. Emend added with cycle 2. Decadron prophylaxis added with cycle 3  18. Diarrhea 10/28/2016. Question related to chemotherapy. Negative C. difficile testing 10/31/2016.  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  20.Admission 12/02/2017 with Bacteroides bacteremia  Biliary obstruction documented on CT/MRI 12/02/2017  Upper endoscopy 12/05/2017 revealed angulation of the apparent limb precluding intubation  Status post placement of a biliary drain 12/05/2017, replaced 12/09/2017  Bile duct brushings 12/09/2017-negative for malignancy  Biliary drain cholangiogram 12/18/2017-there is patency of the biliary-enteric anastomosis with no contrast traverses the afferentlimb into the jejunum  Jejunal stent placed 01/28/2018, brush biopsy  negative for malignancy  Biliary drain removed 02/11/2018  21.Admission 12/29/2017 through 01/02/2018 with sepsis secondary to Klebsiella bacteremia/cholangitis. 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  22.Right pelvicaliectasis on the PET scan 01/16/2018 with perinephric soft tissue fullness, the right ureter is not dilated 23.  Admission 04/28/2018 with nausea/vomiting, increased abdominal pain, and a headache  Blood culture from 04/28/2018+ for Klebsiella pneumoniae 24.  Elevated liver enzymes on admission 04/28/2018- likely secondary to biliary infection 25.  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  The blood culture from admission has returned positive for Klebsiella pneumonia.  I suspect she had bacteremia related to tumor, biliary obstruction, and the small bowel stent.  It is possible the Port-A-Cath is infected, but that she does not have clinical evidence of a Port-A-Cath infection.  She is stable for discharge from an oncology standpoint.  I discussed the case with Dr. Wynelle Cleveland.  She will contact Dr. Tommy Medal to discuss the recommended course of antibiotics and the indication for Port-A-Cath removal.   Recommendations: 1.  Continue narcotic analgesics as needed for pain 2.  Antibiotics,  decision on Port-A-Cath removal per infectious disease 3.  Outpatient follow-up as scheduled at the Cancer center 05/05/2018      LOS: 3 days   Betsy Coder, MD   05/01/2018, 7:39 AM

## 2018-05-01 NOTE — Progress Notes (Signed)
PHARMACY - PHYSICIAN COMMUNICATION CRITICAL VALUE ALERT - BLOOD CULTURE IDENTIFICATION (BCID)  [name] is an 72 y.o. female who presented to [facility] on 04/28/2018 with a chief complaint of abdominal pain  Assessment:  Pt with positive BCID this AM for 1/4 bottles for K. pneumoniae    Name of physician (or Provider) Contacted: Dr Tommy Medal   Current antibiotics: Zosyn  Changes to prescribed antibiotics recommended:   Patient is on recommended antibiotics - No changes needed   Per ID continue current abx for BCx and intra-abdominal infection pending sensitivities   Results for orders placed or performed in visit on 04/28/18  Blood Culture ID Panel (Reflexed) (Collected: 04/28/2018  4:07 PM)  Result Value Ref Range   Enterococcus species NOT DETECTED NOT DETECTED   Listeria monocytogenes NOT DETECTED NOT DETECTED   Staphylococcus species NOT DETECTED NOT DETECTED   Staphylococcus aureus NOT DETECTED NOT DETECTED   Streptococcus species NOT DETECTED NOT DETECTED   Streptococcus agalactiae NOT DETECTED NOT DETECTED   Streptococcus pneumoniae NOT DETECTED NOT DETECTED   Streptococcus pyogenes NOT DETECTED NOT DETECTED   Acinetobacter baumannii NOT DETECTED NOT DETECTED   Enterobacteriaceae species DETECTED (A) NOT DETECTED   Enterobacter cloacae complex NOT DETECTED NOT DETECTED   Escherichia coli NOT DETECTED NOT DETECTED   Klebsiella oxytoca NOT DETECTED NOT DETECTED   Klebsiella pneumoniae DETECTED (A) NOT DETECTED   Proteus species NOT DETECTED NOT DETECTED   Serratia marcescens NOT DETECTED NOT DETECTED   Carbapenem resistance NOT DETECTED NOT DETECTED   Haemophilus influenzae NOT DETECTED NOT DETECTED   Neisseria meningitidis NOT DETECTED NOT DETECTED   Pseudomonas aeruginosa NOT DETECTED NOT DETECTED   Candida albicans NOT DETECTED NOT DETECTED   Candida glabrata NOT DETECTED NOT DETECTED   Candida krusei NOT DETECTED NOT DETECTED   Candida parapsilosis NOT  DETECTED NOT DETECTED   Candida tropicalis NOT DETECTED NOT DETECTED     Royetta Asal, PharmD, BCPS Pager [phone] 05/01/2018 8:49 AM

## 2018-05-01 NOTE — Progress Notes (Signed)
PT Cancellation Note  Patient Details Name: [name] MRN: [id number] DOB: 12/15/1945   Cancelled Treatment:    Reason Eval/Treat Not Completed: PT screened, no needs identified, will sign off; Pt is amb in hallway repeatedly; spoke with pt and she states told the PT yesterday that she did not need PT (she politely apologizes but I agree with her at this time)   [facility] 05/01/2018, 10:49 AM

## 2018-05-01 NOTE — Progress Notes (Signed)
Subjective:  Still c/o pain where port inserts now better with IV changed to PIV   Antibiotics:  Anti-infectives (From admission, onward)   Start     Dose/Rate Route Frequency Ordered Stop   04/29/18 0200  piperacillin-tazobactam (ZOSYN) IVPB 3.375 g     3.375 g 12.5 mL/hr over 240 Minutes Intravenous Every 8 hours 04/29/18 0149        Medications: Scheduled Meds: . feeding supplement  1 Container Oral TID BM  . flecainide  100 mg Oral BID  . metoprolol succinate  25 mg Oral Daily  . polyethylene glycol  17 g Oral Daily  . senna-docusate  2 tablet Oral QHS   Continuous Infusions: . sodium chloride Stopped (04/29/18 0259)  . piperacillin-tazobactam (ZOSYN)  IV 3.375 g (05/01/18 0755)   PRN Meds:.sodium chloride, acetaminophen **OR** acetaminophen, albuterol, gi cocktail, HYDROcodone-acetaminophen, morphine injection, ondansetron **OR** ondansetron (ZOFRAN) IV, zolpidem    Objective: Weight change:   Intake/Output Summary (Last 24 hours) at 05/01/2018 1401 Last data filed at 05/01/2018 0954 Gross per 24 hour  Intake 795.54 ml  Output 2 ml  Net 793.54 ml   Blood pressure 132/86, pulse 60, temperature 97.9 F (36.6 C), temperature source Oral, resp. rate 12, SpO2 100 %. Temp:  [97.9 F (36.6 C)-98.1 F (36.7 C)] 97.9 F (36.6 C) (08/09 0443) Pulse Rate:  [55-60] 60 (08/09 0443) Resp:  [12-18] 12 (08/09 0443) BP: (107-132)/(67-86) 132/86 (08/09 0443) SpO2:  [99 %-100 %] 100 % (08/09 0443)  Physical Exam: General: Alert and awake, oriented x3, not in any acute distress. HEENT: anicteric sclera, EOMI CVS regular rate, normal  Chest: , no wheezing, no respiratory distress Abdomen: less tender Extremities: no edema or deformity noted bilaterally Skin: no rashes, no change in appeararance of port, access is now in PIV Neuro: nonfocal  CBC:    BMET Recent Labs    04/29/18 0500 04/29/18 2030  NA 136 137  K 3.7 3.5  CL 105 105  CO2 23 22  GLUCOSE  96 133*  BUN 18 15  CREATININE 0.99 1.19*  CALCIUM 8.4* 8.5*     Liver Panel  Recent Labs    04/29/18 0500 04/29/18 2030  PROT 6.0* 6.0*  ALBUMIN 3.0* 3.1*  AST 244* 156*  ALT 156* 126*  ALKPHOS 702* 657*  BILITOT 0.7 0.6       Sedimentation Rate No results for input(s): ESRSEDRATE in the last 72 hours. C-Reactive Protein No results for input(s): CRP in the last 72 hours.  Micro Results: Recent Results (from the past 720 hour(s))  Culture, Blood     Status: None (Preliminary result)   Collection Time: 04/28/18  4:07 PM  Result Value Ref Range Status   Specimen Description   Final    PORTA CATH Performed at [facility], [address] [place], [place] [address]    Special Requests   Final    BOTTLES DRAWN AEROBIC AND ANAEROBIC Blood Culture adequate volume   Culture  Setup Time   Final    GRAM NEGATIVE RODS AEROBIC BOTTLE ONLY Organism ID to follow CRITICAL RESULT CALLED TO, READ BACK BY AND VERIFIED WITHChristean Grief [facility] [id number] 05/01/18 A BROWNING Performed at [facility], [place] [address] [place], [place] [address]    Culture PENDING  Incomplete   Report Status PENDING  Incomplete  Blood Culture ID Panel (Reflexed)     Status: Abnormal   Collection Time: 04/28/18  4:07 PM  Result Value Ref Range Status   Enterococcus species NOT DETECTED NOT DETECTED Final   Listeria monocytogenes NOT DETECTED NOT DETECTED Final   Staphylococcus species NOT DETECTED NOT DETECTED Final   Staphylococcus aureus NOT DETECTED NOT DETECTED Final   Streptococcus species NOT DETECTED NOT DETECTED Final   Streptococcus agalactiae NOT DETECTED NOT DETECTED Final   Streptococcus pneumoniae NOT DETECTED NOT DETECTED Final   Streptococcus pyogenes NOT DETECTED NOT DETECTED Final   Acinetobacter baumannii NOT DETECTED NOT DETECTED Final   Enterobacteriaceae species DETECTED (A) NOT DETECTED Final    Comment: Enterobacteriaceae represent a large family of  gram-negative bacteria, not a single organism. CRITICAL RESULT CALLED TO, READ BACK BY AND VERIFIED WITH: Christean Grief [facility] [id number] 05/01/18 A BROWNING    Enterobacter cloacae complex NOT DETECTED NOT DETECTED Final   Escherichia coli NOT DETECTED NOT DETECTED Final   Klebsiella oxytoca NOT DETECTED NOT DETECTED Final   Klebsiella pneumoniae DETECTED (A) NOT DETECTED Final    Comment: CRITICAL RESULT CALLED TO, READ BACK BY AND VERIFIED WITH: Christean Grief PHARMD [id number] 05/01/18 A BROWNING    Proteus species NOT DETECTED NOT DETECTED Final   Serratia marcescens NOT DETECTED NOT DETECTED Final   Carbapenem resistance NOT DETECTED NOT DETECTED Final   Haemophilus influenzae NOT DETECTED NOT DETECTED Final   Neisseria meningitidis NOT DETECTED NOT DETECTED Final   Pseudomonas aeruginosa NOT DETECTED NOT DETECTED Final   Candida albicans NOT DETECTED NOT DETECTED Final   Candida glabrata NOT DETECTED NOT DETECTED Final   Candida krusei NOT DETECTED NOT DETECTED Final   Candida parapsilosis NOT DETECTED NOT DETECTED Final   Candida tropicalis NOT DETECTED NOT DETECTED Final    Comment: Performed at [facility], [place] [address] [place], [place] [address]    Studies/Results: No results found.    Assessment/Plan:  INTERVAL HISTORY: blood cultures now growing a Klebsiella PNA species   Principal Problem:   Cholangiolitis Active Problems:   HTN (hypertension)   OSA (obstructive sleep apnea)   Adenocarcinoma of head of pancreas (HCC)   Protein-calorie malnutrition, severe (HCC)   Dehydration   Paroxysmal atrial fibrillation (HCC)   Orthostatic hypotension   Elevated LFTs   Malnutrition of moderate degree   Bacteremia due to Klebsiella pneumoniae   Infection of venous access port   Leucopenia   Cholangitis    [name] is a 72 y.o. 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 She has leukocytosis and malaise, and fatigue but no fever. She received neulasta with last dose of chemotherapy.  She now has recurrent GNR bacteremia with Klebsiella PNA growing from blood yet again. Organism ID by BCID and GNR growing but sensis not back yet   #1 Port-a cath infection: with apparent recurrent Klebsiella PNA.   If it is exact same organism we should be able to use unasyn  I suspect the bacteremia that occurred in April seeded this port and we are dealing with recurrent infection from the port. It is also possible that it is a new infection but will be difficult to distinguish if abx sensis only slighltly different given exposure to abx and possibility of evolution of species.   EVEN if this is a new bacteremia related to abdominal problems it is better for patients safety to REMOVE port  I have discussed with Dr.  Sherill who is in agreement and also with Claiborne Billings from [facility]. Dr. Barry Dienes takes care of this pt and would likely be one to remove port from surgical standpoint  If she has port removed important to clean up area around port of any infection  I would then give her a catheter holiday and follow this with 2 week course of parenteral therapy with low risk abx (for C diff) per patient request in particular  I would recommend using PICC for this AFTER this catheter holiday  #2 Possible cholangitis with complicated history and stent present:  If we cover culprit in blood + anerobes I think this is sufficient  #3 Pancreatic cancer: she is due to get chemo next week.  From an ID standpoint it is always preferable to delay chemotherapy  In this instance I would think given complexity of issuees in her abdomen and bacteremia that better to delay chemo for at least 1-2 weeks.  That being said we need weigh this worth how much risk we will put her at from standpoint of her malignancy   I spent  greater than 35 minutes with the patient including greater than 50% of time in face to face counsel of the patient re the nature of port infections, her abdominal infections, chemotherapy, CDI risk, nature of home antibiotics and in coordination of her care with Primary Team, General Surgery, Dr. [name] and Carolynn Sayers from [facility].  Dr. Baxter Flattery will followup the patient's cultures this weekend and will take over service on Monday.   LOS: 3 days   Alcide Evener 05/01/2018, 2:01 PM

## 2018-05-01 NOTE — Progress Notes (Signed)
ALERTED TO PTS POS BLOOD CULTURE W KLEB PNA  PORT MUST NOW BE REMOVED AND PT GET CENTRAL LINE HOLDAY   WILL NARROW IV ABX AS ABLE  WILL DISCUSS W DR [place]

## 2018-05-01 NOTE — Progress Notes (Signed)
[facility] Progress Note     Subjective: CC-  [name] is a 72yo female PMH pancreatic cancer s/p Whipple resection and choledochojejunostomy with recurrence of her pancreatic cancer currently on chemothearpy (last dose 04/22/18), who was admitted to [facility] 8/6 with abdominal pain, nausea, and vomiting.  In ER she was afebrile but had leukocytosis and elevation of AST, ALT and alkalline phosphatase. She had CT scan abdomen pelvis done which showed: 1. Slight interval improvement in the appearance of the abdomen and pelvis. Slight decrease in right-sided retroperitoneal soft tissue mass encasing the IVC, celiac trunk, SMA, duodenum and right renal pelvis with slight interval decrease in extrahepatic and pancreatic ductal dilatation. 2. Slight interval decrease and marked hydroureteronephrosis. 3. Slight decrease in single wall thickness of the duodenum at the level of the stent. 4. Mild fluid-filled small bowel loops may represent small bowel enteritis. No mechanical bowel obstruction is seen.  Blood cultures were drawn and came back today positive for klebsiella and enterobacteriaceae. Patient was treated for Klebsiella PNA bacteremia earlier this year; at that time her port a cath was not removed but she improved. Due to recurrence of bacteremia general surgery has been consulted for consideration of port removal. States that her port a cath was tender earlier this week, but that has now resolved.  Of note, patient is on xarelto for h/o Afib and prior pulmonary embolus; last dose was 8/5.  Objective: Vital signs in last 24 hours: Temp:  [97.9 F (36.6 C)-98.1 F (36.7 C)] 97.9 F (36.6 C) (08/09 0443) Pulse Rate:  [55-60] 60 (08/09 0443) Resp:  [12-18] 12 (08/09 0443) BP: (107-132)/(67-86) 132/86 (08/09 0443) SpO2:  [99 %-100 %] 100 % (08/09 0443) Last BM Date: 04/30/18  Intake/Output from previous day: 08/08 0701 - 08/09 0700 In: 755.5 [P.O.:400; IV  Piggyback:355.5] Out: 3 [Urine:3] Intake/Output this shift: Total I/O In: 240 [P.O.:240] Out: -   PE: Gen:  Alert, NAD, pleasant HEENT: EOM's intact, pupils equal and round Card:  Irregularly irregular. 2+ DP pulses Pulm:  CTAB, no W/R/R, effort normal. Port a cath left chest without erythema, drainage, or TTP Abd: well healed midline incision, soft, ND, mild upper abdominal tenderness without rebound or guarding, +BS Ext: calves soft and nontender without edema Psych: A&Ox3  Skin: no rashes noted, warm and dry  Lab Results:  Recent Labs    04/29/18 0500 04/30/18 0806  WBC 24.4* 26.5*  HGB 8.1* 8.9*  HCT 23.8* 25.8*  PLT 273 287   BMET Recent Labs    04/29/18 0500 04/29/18 2030  NA 136 137  K 3.7 3.5  CL 105 105  CO2 23 22  GLUCOSE 96 133*  BUN 18 15  CREATININE 0.99 1.19*  CALCIUM 8.4* 8.5*   PT/INR No results for input(s): LABPROT, INR in the last 72 hours. CMP     Component Value Date/Time   NA 137 04/29/2018 2030   NA 138 06/03/2017 0937   K 3.5 04/29/2018 2030   K 4.0 06/03/2017 0937   CL 105 04/29/2018 2030   CO2 22 04/29/2018 2030   CO2 24 06/03/2017 0937   GLUCOSE 133 (H) 04/29/2018 2030   GLUCOSE 111 06/03/2017 0937   BUN 15 04/29/2018 2030   BUN 13.0 06/03/2017 0937   CREATININE 1.19 (H) 04/29/2018 2030   CREATININE 1.19 (H) 04/28/2018 1417   CREATININE 0.8 06/03/2017 0937   CALCIUM 8.5 (L) 04/29/2018 2030   CALCIUM 9.2 06/03/2017 0937   PROT 6.0 (L) 04/29/2018  2030   PROT 6.9 06/03/2017 0937   ALBUMIN 3.1 (L) 04/29/2018 2030   ALBUMIN 3.6 06/03/2017 0937   AST 156 (H) 04/29/2018 2030   AST 440 (HH) 04/28/2018 1417   AST 55 (H) 06/03/2017 0937   ALT 126 (H) 04/29/2018 2030   ALT 213 (H) 04/28/2018 1417   ALT 38 06/03/2017 0937   ALKPHOS 657 (H) 04/29/2018 2030   ALKPHOS 142 06/03/2017 0937   BILITOT 0.6 04/29/2018 2030   BILITOT 1.3 (H) 04/28/2018 1417   BILITOT 0.56 06/03/2017 0937   GFRNONAA 45 (L) 04/29/2018 2030   GFRNONAA 45  (L) 04/28/2018 1417   GFRAA 52 (L) 04/29/2018 2030   GFRAA 52 (L) 04/28/2018 1417   Lipase     Component Value Date/Time   LIPASE 29 04/26/2018 1920   LIPASE 5 (L) 06/26/2017 1446       Studies/Results: No results found.  Anti-infectives: Anti-infectives (From admission, onward)   Start     Dose/Rate Route Frequency Ordered Stop   04/29/18 0200  piperacillin-tazobactam (ZOSYN) IVPB 3.375 g     3.375 g 12.5 mL/hr over 240 Minutes Intravenous Every 8 hours 04/29/18 0149         Assessment/Plan H/o endometrial cancer s/p hysterectomy Atrial fibrillation, h/o PE - last dose xarelto 8/5 HTN Severe protein calorie malnutrition Stage IV pancreatic cancer s/p Whipple resection and choledochojejunostomy, recurrent  Bacteremia - blood cultures from 8/6 growing klebsiella and enterobacteriaceae - Concern that recurrent bacteremia may be due to an infected port a cath. This was placed 10/21/2014. Patient has eaten today so we will make her NPO after MN and plan for port a cath removal tomorrow in the OR. Continue to hold xarelto.  ID - zosyn 8/7>> FEN - HH diet and Boost, NPO after midnight VTE - SCDs Foley - none   LOS: 3 days    Wellington Hampshire , [facility] 05/01/2018, 10:26 AM Pager: [phone] Consults: [phone] Mon 7:00 am -11:30 AM Tues-Fri 7:00 am-4:30 pm Sat-Sun 7:00 am-11:30 am

## 2018-05-01 NOTE — Progress Notes (Addendum)
Progress Note   Subjective  Chief Complaint: Abdominal pain, question cholangitis  Today, patient was found walking up and down the halls.  She is still quite frustrated about her entire condition, but does tell me that there are plans to remove her port today due to infection and she is just ready to move forward in the process.   Objective   Vital signs in last 24 hours: Temp:  [97.9 F (36.6 C)-98.1 F (36.7 C)] 97.9 F (36.6 C) (08/09 0443) Pulse Rate:  [55-60] 60 (08/09 0443) Resp:  [12-18] 12 (08/09 0443) BP: (107-132)/(67-86) 132/86 (08/09 0443) SpO2:  [99 %-100 %] 100 % (08/09 0443) Last BM Date: 04/30/18 General:    white female in NAD Heart:  Regular rate and rhythm; no murmurs Lungs: Respirations even and unlabored, lungs CTA bilaterally Abdomen:  Soft, mild epigastric/RUQ ttp and nondistended. Normal bowel sounds. Extremities:  Without edema. Neurologic:  Alert and oriented,  grossly normal neurologically. Psych:  Cooperative. Normal mood and affect.  Intake/Output from previous day: 08/08 0701 - 08/09 0700 In: 755.5 [P.O.:400; IV Piggyback:355.5] Out: 3 [Urine:3] Intake/Output this shift: Total I/O In: 240 [P.O.:240] Out: -   Lab Results: Recent Labs    04/28/18 1417 04/29/18 0500 04/30/18 0806  WBC 28.8* 24.4* 26.5*  HGB 8.8* 8.1* 8.9*  HCT 26.6* 23.8* 25.8*  PLT 340 273 287   BMET Recent Labs    04/28/18 1417 04/29/18 0500 04/29/18 2030  NA 133* 136 137  K 4.6 3.7 3.5  CL 101 105 105  CO2 '22 23 22  ' GLUCOSE 102* 96 133*  BUN '22 18 15  ' CREATININE 1.19* 0.99 1.19*  CALCIUM 8.9 8.4* 8.5*   Hepatic Function Latest Ref Rng & Units 04/29/2018 04/29/2018 04/28/2018  Total Protein 6.5 - 8.1 g/dL 6.0(L) 6.0(L) 6.7  Albumin 3.5 - 5.0 g/dL 3.1(L) 3.0(L) 3.1(L)  AST 15 - 41 U/L 156(H) 244(H) 440(HH)  ALT 0 - 44 U/L 126(H) 156(H) 213(H)  Alk Phosphatase 38 - 126 U/L 657(H) 702(H) 922(H)  Total Bilirubin 0.3 - 1.2 mg/dL 0.6 0.7 1.3(H)  Bilirubin,  Direct 0.1 - 0.5 mg/dL - - -     Assessment / Plan:   Assessment: 1.  Pancreatic cancer status post Whipple resection choledochojejunostomy 2.  Elevated LFTs: Continue to trend down 3.  Abdominal pain 4.  A. Fib: Last dose of Xarelto 04/27/2018 5.  Leukocytosis  Plan: 1.  LFTs continue to trend down.  It appears blood culture has come back with Kleb PNA and patient's port is going to be removed.  Dr. Drucilla Schmidt with infectious disease is following and will narrow IV antibiotics as able. 2.  Dr. Lyndel Safe will likely see the patient later this afternoon, please await any final recommendations.  We will sign off.    LOS: 3 days   Levin Erp  05/01/2018, 10:38 AM   Attending physician's note   I have taken an interval history, reviewed the chart and examined the patient. I agree with the Advanced Practitioner's note, impression and recommendations.   72 year old with pancreatic adeno ca s/p Whipple with metastatic disease on chemo.  Adm 12/2017 with cholangitis s/p external biliary drain by IR, removed 01/2018. Also has jejunal stent placement at site of tumor invasion. Admitted with abdominal pain, N/V, increasing WBC count and sepsis. On Zosyn. Recent CT 04/26/18 shows decreased CBD dilation and decreased tumor burden.  LFTs trending down. ID feels sepsis is due to Port-A-Cath.  Planned Port-A-Cath removal tomorrow.  No new GI recommendations.  If she has any further episodes of ascending cholangitis, may require percutaneous biliary drain.  Will sign off for now.  Please call with any questions or if we could be helpful.  Carmell Austria, MD

## 2018-05-02 ENCOUNTER — Inpatient Hospital Stay (HOSPITAL_COMMUNITY): Payer: Medicare Other | Admitting: Registered Nurse

## 2018-05-02 ENCOUNTER — Encounter (HOSPITAL_COMMUNITY)
Admission: AD | Disposition: A | Discharge status: Home / Self Care | Payer: Self-pay | Source: Home / Self Care | Attending: Internal Medicine

## 2018-05-02 HISTORY — PX: PORT-A-CATH REMOVAL: SHX5289

## 2018-05-02 LAB — CBC
HEMATOCRIT: 24.6 % — AB (ref 36.0–46.0)
Hemoglobin: 8.3 g/dL — ABNORMAL LOW (ref 12.0–15.0)
MCH: 29.3 pg (ref 26.0–34.0)
MCHC: 33.7 g/dL (ref 30.0–36.0)
MCV: 86.9 fL (ref 78.0–100.0)
Platelets: 180 10*3/uL (ref 150–400)
RBC: 2.83 MIL/uL — ABNORMAL LOW (ref 3.87–5.11)
RDW: 16.2 % — AB (ref 11.5–15.5)
WBC: 15.5 10*3/uL — ABNORMAL HIGH (ref 4.0–10.5)

## 2018-05-02 LAB — COMPREHENSIVE METABOLIC PANEL
ALBUMIN: 2.5 g/dL — AB (ref 3.5–5.0)
ALT: 58 U/L — ABNORMAL HIGH (ref 0–44)
ANION GAP: 7 (ref 5–15)
AST: 45 U/L — AB (ref 15–41)
Alkaline Phosphatase: 435 U/L — ABNORMAL HIGH (ref 38–126)
BILIRUBIN TOTAL: 0.4 mg/dL (ref 0.3–1.2)
BUN: 12 mg/dL (ref 8–23)
CO2: 26 mmol/L (ref 22–32)
Calcium: 8.2 mg/dL — ABNORMAL LOW (ref 8.9–10.3)
Chloride: 106 mmol/L (ref 98–111)
Creatinine, Ser: 1.12 mg/dL — ABNORMAL HIGH (ref 0.44–1.00)
GFR calc Af Amer: 56 mL/min — ABNORMAL LOW (ref >60)
GFR calc non Af Amer: 48 mL/min — ABNORMAL LOW (ref >60)
GLUCOSE: 153 mg/dL — AB (ref 70–99)
POTASSIUM: 3.7 mmol/L (ref 3.5–5.1)
SODIUM: 139 mmol/L (ref 135–145)
TOTAL PROTEIN: 5.3 g/dL — AB (ref 6.5–8.1)

## 2018-05-02 LAB — SURGICAL PCR SCREEN
MRSA, PCR: NEGATIVE
STAPHYLOCOCCUS AUREUS: NEGATIVE

## 2018-05-02 SURGERY — REMOVAL PORT-A-CATH
Anesthesia: General | Site: Chest

## 2018-05-02 MED ORDER — FENTANYL CITRATE (PF) 100 MCG/2ML IJ SOLN
INTRAMUSCULAR | Status: DC | PRN
  Administered 2018-05-02 (×3): 50 ug via INTRAVENOUS

## 2018-05-02 MED ORDER — FLUTICASONE PROPIONATE 50 MCG/ACT NA SUSP
1.0000 | Freq: Two times a day (BID) | NASAL | Status: DC | PRN
  Administered 2018-05-03: 1 via NASAL
  Filled 2018-05-02: qty 16

## 2018-05-02 MED ORDER — FENTANYL CITRATE (PF) 100 MCG/2ML IJ SOLN: 25.0000 ug | INTRAMUSCULAR | Status: DC | PRN

## 2018-05-02 MED ORDER — RIVAROXABAN 20 MG PO TABS: 20.0000 mg | ORAL_TABLET | Freq: Every day | ORAL | Status: DC

## 2018-05-02 MED ORDER — LIDOCAINE 2% (20 MG/ML) 5 ML SYRINGE
INTRAMUSCULAR | Status: DC | PRN
  Administered 2018-05-02: 50 mg via INTRAVENOUS

## 2018-05-02 MED ORDER — PROPOFOL 10 MG/ML IV BOLUS
INTRAVENOUS | Status: DC | PRN
  Administered 2018-05-02: 20 mg via INTRAVENOUS
  Administered 2018-05-02: 100 mg via INTRAVENOUS

## 2018-05-02 MED ORDER — CHLORHEXIDINE GLUCONATE CLOTH 2 % EX PADS
6.0000 | MEDICATED_PAD | Freq: Once | CUTANEOUS | Status: AC
  Administered 2018-05-02: 6 via TOPICAL

## 2018-05-02 MED ORDER — 0.9 % SODIUM CHLORIDE (POUR BTL) OPTIME
TOPICAL | Status: DC | PRN
  Administered 2018-05-02: 1000 mL

## 2018-05-02 MED ORDER — FENTANYL CITRATE (PF) 100 MCG/2ML IJ SOLN
INTRAMUSCULAR | Status: AC
  Filled 2018-05-02: qty 2

## 2018-05-02 MED ORDER — BUPIVACAINE-EPINEPHRINE (PF) 0.25% -1:200000 IJ SOLN
INTRAMUSCULAR | Status: AC
  Filled 2018-05-02: qty 30

## 2018-05-02 MED ORDER — PROPOFOL 10 MG/ML IV BOLUS
INTRAVENOUS | Status: AC
  Filled 2018-05-02: qty 20

## 2018-05-02 MED ORDER — LACTATED RINGERS IV SOLN
INTRAVENOUS | Status: DC | PRN
  Administered 2018-05-02: 08:00:00 via INTRAVENOUS

## 2018-05-02 MED ORDER — MIDAZOLAM HCL 2 MG/2ML IJ SOLN
INTRAMUSCULAR | Status: AC
  Filled 2018-05-02: qty 2

## 2018-05-02 MED ORDER — ONDANSETRON HCL 4 MG/2ML IJ SOLN
INTRAMUSCULAR | Status: AC
  Filled 2018-05-02: qty 2

## 2018-05-02 MED ORDER — BUPIVACAINE-EPINEPHRINE 0.25% -1:200000 IJ SOLN
INTRAMUSCULAR | Status: DC | PRN
Start: 2018-05-02 — End: 2018-05-02
  Administered 2018-05-02: 8 mL

## 2018-05-02 MED ORDER — RIVAROXABAN 15 MG PO TABS
15.0000 mg | ORAL_TABLET | Freq: Every day | ORAL | Status: DC
  Administered 2018-05-03 – 2018-05-04 (×2): 15 mg via ORAL
  Filled 2018-05-02 (×3): qty 1

## 2018-05-02 MED ORDER — ONDANSETRON HCL 4 MG/2ML IJ SOLN
INTRAMUSCULAR | Status: DC | PRN
  Administered 2018-05-02: 4 mg via INTRAVENOUS

## 2018-05-02 SURGICAL SUPPLY — 27 items
BLADE SURG SZ10 CARB STEEL (BLADE) ×3 IMPLANT
BNDG GAUZE ELAST 4 BULKY (GAUZE/BANDAGES/DRESSINGS) IMPLANT
CHLORAPREP W/TINT 26ML (MISCELLANEOUS) ×3 IMPLANT
COVER SURGICAL LIGHT HANDLE (MISCELLANEOUS) ×3 IMPLANT
DECANTER SPIKE VIAL GLASS SM (MISCELLANEOUS) ×3 IMPLANT
DRAIN PENROSE 18X1/4 LTX STRL (WOUND CARE) IMPLANT
DRAPE LAPAROTOMY TRNSV 102X78 (DRAPE) ×3 IMPLANT
DRSG PAD ABDOMINAL 8X10 ST (GAUZE/BANDAGES/DRESSINGS) IMPLANT
DRSG TEGADERM 4X4.75 (GAUZE/BANDAGES/DRESSINGS) ×2 IMPLANT
ELECT REM PT RETURN 15FT ADLT (MISCELLANEOUS) ×3 IMPLANT
GAUZE 4X4 16PLY RFD (DISPOSABLE) ×3 IMPLANT
GAUZE SPONGE 4X4 12PLY STRL (GAUZE/BANDAGES/DRESSINGS) ×2 IMPLANT
GLOVE BIO SURGEON STRL SZ 6 (GLOVE) ×3 IMPLANT
GLOVE INDICATOR 6.5 STRL GRN (GLOVE) ×3 IMPLANT
GOWN STRL REUS W/TWL LRG LVL3 (GOWN DISPOSABLE) ×3 IMPLANT
GOWN STRL REUS W/TWL XL LVL3 (GOWN DISPOSABLE) ×3 IMPLANT
KIT BASIN OR (CUSTOM PROCEDURE TRAY) ×3 IMPLANT
MARKER SKIN DUAL TIP RULER LAB (MISCELLANEOUS) ×3 IMPLANT
NEEDLE HYPO 22GX1.5 SAFETY (NEEDLE) ×2 IMPLANT
PACK GENERAL/GYN (CUSTOM PROCEDURE TRAY) ×3 IMPLANT
SUT MNCRL AB 4-0 PS2 18 (SUTURE) ×3 IMPLANT
SUT VIC AB 3-0 SH 27 (SUTURE) ×3
SUT VIC AB 3-0 SH 27XBRD (SUTURE) IMPLANT
SYR CONTROL 10ML LL (SYRINGE) ×2 IMPLANT
TOWEL OR 17X26 10 PK STRL BLUE (TOWEL DISPOSABLE) ×3 IMPLANT
TOWEL OR NON WOVEN STRL DISP B (DISPOSABLE) ×3 IMPLANT
YANKAUER SUCT BULB TIP 10FT TU (MISCELLANEOUS) ×3 IMPLANT

## 2018-05-02 NOTE — Anesthesia Procedure Notes (Signed)
Procedure Name: LMA Insertion Date/Time: 05/02/2018 8:33 AM Performed by: Lissa Morales, CRNA Pre-anesthesia Checklist: Patient identified, Emergency Drugs available, Suction available and Patient being monitored Patient Re-evaluated:Patient Re-evaluated prior to induction Oxygen Delivery Method: Circle system utilized Preoxygenation: Pre-oxygenation with 100% oxygen Induction Type: IV induction Ventilation: Mask ventilation without difficulty LMA: LMA inserted LMA Size: 4.0 Tube type: Oral Number of attempts: 1 Airway Equipment and Method: Stylet and Oral airway Placement Confirmation: ETT inserted through vocal cords under direct vision,  positive ETCO2 and breath sounds checked- equal and bilateral Tube secured with: Tape Dental Injury: Teeth and Oropharynx as per pre-operative assessment  Comments: Attempt with proseal ,did not seat  ,standard lma  Seated well

## 2018-05-02 NOTE — Anesthesia Preprocedure Evaluation (Signed)
Anesthesia Evaluation  Patient identified by MRN, date of birth, ID band Patient awake    Reviewed: Allergy & Precautions, NPO status   Airway Mallampati: II  TM Distance: >3 FB     Dental   Pulmonary sleep apnea , pneumonia,    breath sounds clear to auscultation       Cardiovascular hypertension,  Rhythm:Regular Rate:Normal     Neuro/Psych    GI/Hepatic GERD  ,(+) Hepatitis -  Endo/Other  negative endocrine ROS  Renal/GU      Musculoskeletal  (+) Arthritis ,   Abdominal   Peds  Hematology  (+) anemia ,   Anesthesia Other Findings   Reproductive/Obstetrics                             Anesthesia Physical Anesthesia Plan  ASA: III  Anesthesia Plan: General   Post-op Pain Management:    Induction: Intravenous  PONV Risk Score and Plan: 3 and Midazolam, Dexamethasone and Ondansetron  Airway Management Planned: LMA  Additional Equipment:   Intra-op Plan:   Post-operative Plan: Extubation in OR  Informed Consent: I have reviewed the patients History and Physical, chart, labs and discussed the procedure including the risks, benefits and alternatives for the proposed anesthesia with the patient or authorized representative who has indicated his/her understanding and acceptance.   Dental advisory given  Plan Discussed with: CRNA and Anesthesiologist  Anesthesia Plan Comments:         Anesthesia Quick Evaluation

## 2018-05-02 NOTE — Progress Notes (Signed)
Pt. returned to room from OR. Alert and oriented x 4, no respiratory distress noted. Family at bedside.

## 2018-05-02 NOTE — Progress Notes (Addendum)
PROGRESS NOTE    [name]   JTT:[id number]  DOB: Feb 12, 1946  DOA: 04/28/2018 PCP: Chesley Noon, MD   Brief Narrative:  [name] is a 72 y/o female with pancreatic cancer status post Whipple resection and choledochojejunostomy, internal/external biliary drain on 12/05/2017, small bowel stent placement May 2019, A. fib, obesity, sleep apnea, PE and hypertension, who presented to the ER on 04/28/2018 with nausea and vomiting & abdominal pain. Last chemotherapy last on 04/22/2018 Found to have elevated LFTs and WBC count in the 20s and admitted for possible cholangitis and started on Zosyn. GI consulted. GI subsequently requested an ID eval.   Subjective: Port removes. She is doing well over alll and is without complaints.     Assessment & Plan:   Principal Problem: Elevated LFTs, leukocytosis and abdominal pain - Klebsiella bacteremia - she previously had K pneumoniae bacteremia in 4/19 - ? Cholangitis- initial stent placed in 9/15 - ID recommends port to be removed which has been done today - cont anibiotoics per ID-  Dr Drucilla Schmidt is recommending PICC after catheter holiday and subsequently 2 wk course of IV treament -  appreciate ID and GI evals    Active Problems:     HTN (hypertension)/ PAF - HR in 45s- Toprol with holding parameters -  Xarelto on hold for possible procedures -   can resume tomorrow as port has been removed and no other major procedures planned- I discussed this with Dr Kae Heller- cont Flecainide    Adenocarcinoma of head of pancreas  - Dr Ammie Dalton has evaluated the patient and is following    Protein-calorie malnutrition, severe  - cont supplements recommended by nutrionist    DVT prophylaxis: SCDs Code Status: full code Family Communication:  Disposition Plan: home when stable Consultants:   Medical oncology  GI  ID Procedures:    Port removal today Antimicrobials:  Anti-infectives (From admission, onward)   Start     Dose/Rate  Route Frequency Ordered Stop   04/29/18 0200  piperacillin-tazobactam (ZOSYN) IVPB 3.375 g     3.375 g 12.5 mL/hr over 240 Minutes Intravenous Every 8 hours 04/29/18 0149         Objective: Vitals:   05/02/18 0945 05/02/18 0955 05/02/18 1034 05/02/18 1411  BP: 110/69 131/81 (!) 143/74 117/84  Pulse: (!) 50 (!) 59 (!) 58 63  Resp: 20 19 20 16   Temp:  98.1 F (36.7 C) 98.2 F (36.8 C) 97.8 F (36.6 C)  TempSrc:    Oral  SpO2: 98% 96% 99% 99%    Intake/Output Summary (Last 24 hours) at 05/02/2018 1524 Last data filed at 05/02/2018 1400 Gross per 24 hour  Intake 1592.36 ml  Output 5 ml  Net 1587.36 ml   There were no vitals filed for this visit.  Examination: General exam: Appears comfortable  HEENT: PERRLA, oral mucosa moist, no sclera icterus or thrush Respiratory system: Clear to auscultation. Respiratory effort normal. Cardiovascular system: S1 & S2 heard, RRR.   Gastrointestinal system: Abdomen soft,  Tenderness in epigastrium, nondistended. Normal bowel sound. No organomegaly Central nervous system: Alert and oriented. No focal neurological deficits. Extremities: No cyanosis, clubbing or edema Skin: No rashes or ulcers Psychiatry:  Mood & affect appropriate.     Data Reviewed: I have personally reviewed following labs and imaging studies  CBC: Recent Labs  Lab 04/26/18 1920 04/28/18 1417 04/29/18 0500 04/30/18 0806 05/02/18 0542  WBC 20.8* 28.8* 24.4* 26.5* 15.5*  NEUTROABS  --  26.1*  --   --   --  HGB 8.6* 8.8* 8.1* 8.9* 8.3*  HCT 26.1* 26.6* 23.8* 25.8* 24.6*  MCV 90.0 87.4 86.5 85.4 86.9  PLT 411* 340 273 287 209   Basic Metabolic Panel: Recent Labs  Lab 04/26/18 1920 04/28/18 1417 04/29/18 0500 04/29/18 2030  NA 130* 133* 136 137  K 3.9 4.6 3.7 3.5  CL 98 101 105 105  CO2 19* 22 23 22   GLUCOSE 92 102* 96 133*  BUN 15 22 18 15   CREATININE 1.03* 1.19* 0.99 1.19*  CALCIUM 8.8* 8.9 8.4* 8.5*  MG  --   --  1.9  --   PHOS  --   --  3.6  --     GFR: Estimated Creatinine Clearance: 34.3 mL/min (A) (by C-G formula based on SCr of 1.19 mg/dL (H)). Liver Function Tests: Recent Labs  Lab 04/26/18 1920 04/28/18 1417 04/29/18 0500 04/29/18 2030  AST 41 440* 244* 156*  ALT 29 213* 156* 126*  ALKPHOS 318* 922* 702* 657*  BILITOT 0.8 1.3* 0.7 0.6  PROT 6.6 6.7 6.0* 6.0*  ALBUMIN 3.1* 3.1* 3.0* 3.1*   Recent Labs  Lab 04/26/18 1920  LIPASE 29   No results for input(s): AMMONIA in the last 168 hours. Coagulation Profile: No results for input(s): INR, PROTIME in the last 168 hours. Cardiac Enzymes: No results for input(s): CKTOTAL, CKMB, CKMBINDEX, TROPONINI in the last 168 hours. BNP (last 3 results) Recent Labs    12/19/17 1257  PROBNP 103   HbA1C: No results for input(s): HGBA1C in the last 72 hours. CBG: No results for input(s): GLUCAP in the last 168 hours. Lipid Profile: No results for input(s): CHOL, HDL, LDLCALC, TRIG, CHOLHDL, LDLDIRECT in the last 72 hours. Thyroid Function Tests: No results for input(s): TSH, T4TOTAL, FREET4, T3FREE, THYROIDAB in the last 72 hours. Anemia Panel: No results for input(s): VITAMINB12, FOLATE, FERRITIN, TIBC, IRON, RETICCTPCT in the last 72 hours. Urine analysis:    Component Value Date/Time   COLORURINE STRAW (A) 04/26/2018 1939   APPEARANCEUR CLEAR 04/26/2018 1939   LABSPEC 1.004 (L) 04/26/2018 1939   LABSPEC 1.005 04/21/2014 1713   PHURINE 5.0 04/26/2018 1939   GLUCOSEU NEGATIVE 04/26/2018 1939   GLUCOSEU Negative 04/21/2014 1713   HGBUR NEGATIVE 04/26/2018 1939   BILIRUBINUR NEGATIVE 04/26/2018 1939   BILIRUBINUR Negative 04/21/2014 1713   KETONESUR NEGATIVE 04/26/2018 1939   PROTEINUR NEGATIVE 04/26/2018 1939   UROBILINOGEN 0.2 10/16/2014 1035   UROBILINOGEN 0.2 04/21/2014 1713   NITRITE NEGATIVE 04/26/2018 1939   LEUKOCYTESUR NEGATIVE 04/26/2018 1939   LEUKOCYTESUR Trace 04/21/2014 1713   Sepsis Labs: @LABRCNTIP (procalcitonin:4,lacticidven:4) ) Recent  Results (from the past 240 hour(s))  Culture, Blood     Status: Abnormal (Preliminary result)   Collection Time: 04/28/18  4:07 PM  Result Value Ref Range Status   Specimen Description   Final    PORTA CATH Performed at [facility], [place] [address] [place], [place] [address]    Special Requests   Final    BOTTLES DRAWN AEROBIC AND ANAEROBIC Blood Culture adequate volume   Culture  Setup Time   Final    GRAM NEGATIVE RODS AEROBIC BOTTLE ONLY CRITICAL RESULT CALLED TO, READ BACK BY AND VERIFIED WITHChristean Grief [facility] 2836 05/01/18 A BROWNING Performed at [facility], [place] [address], [place], [place] [address]    Culture KLEBSIELLA PNEUMONIAE (A)  Final   Report Status PENDING  Incomplete  Blood Culture ID Panel (Reflexed)     Status: Abnormal  Collection Time: 04/28/18  4:07 PM  Result Value Ref Range Status   Enterococcus species NOT DETECTED NOT DETECTED Final   Listeria monocytogenes NOT DETECTED NOT DETECTED Final   Staphylococcus species NOT DETECTED NOT DETECTED Final   Staphylococcus aureus NOT DETECTED NOT DETECTED Final   Streptococcus species NOT DETECTED NOT DETECTED Final   Streptococcus agalactiae NOT DETECTED NOT DETECTED Final   Streptococcus pneumoniae NOT DETECTED NOT DETECTED Final   Streptococcus pyogenes NOT DETECTED NOT DETECTED Final   Acinetobacter baumannii NOT DETECTED NOT DETECTED Final   Enterobacteriaceae species DETECTED (A) NOT DETECTED Final    Comment: Enterobacteriaceae represent a large family of gram-negative bacteria, not a single organism. CRITICAL RESULT CALLED TO, READ BACK BY AND VERIFIED WITH: Christean Grief PHARMD [id number] 05/01/18 A BROWNING    Enterobacter cloacae complex NOT DETECTED NOT DETECTED Final   Escherichia coli NOT DETECTED NOT DETECTED Final   Klebsiella oxytoca NOT DETECTED NOT DETECTED Final   Klebsiella pneumoniae DETECTED (A) NOT DETECTED Final    Comment: CRITICAL RESULT CALLED TO, READ BACK BY AND  VERIFIED WITH: Christean Grief PHARMD [id number] 05/01/18 A BROWNING    Proteus species NOT DETECTED NOT DETECTED Final   Serratia marcescens NOT DETECTED NOT DETECTED Final   Carbapenem resistance NOT DETECTED NOT DETECTED Final   Haemophilus influenzae NOT DETECTED NOT DETECTED Final   Neisseria meningitidis NOT DETECTED NOT DETECTED Final   Pseudomonas aeruginosa NOT DETECTED NOT DETECTED Final   Candida albicans NOT DETECTED NOT DETECTED Final   Candida glabrata NOT DETECTED NOT DETECTED Final   Candida krusei NOT DETECTED NOT DETECTED Final   Candida parapsilosis NOT DETECTED NOT DETECTED Final   Candida tropicalis NOT DETECTED NOT DETECTED Final    Comment: Performed at [facility], [place] [address] [place], [place] [address]  Surgical pcr screen     Status: None   Collection Time: 05/02/18  2:17 AM  Result Value Ref Range Status   MRSA, PCR NEGATIVE NEGATIVE Final   Staphylococcus aureus NEGATIVE NEGATIVE Final    Comment: (NOTE) The Xpert SA Assay (FDA approved for NASAL specimens in patients 7 years of age and older), is one component of a comprehensive surveillance program. It is not intended to diagnose infection nor to guide or monitor treatment. Performed at [facility], [place] [address], [place], [place] [address]          Radiology Studies: No results found.    Scheduled Meds: . feeding supplement  1 Container Oral TID BM  . flecainide  100 mg Oral BID  . metoprolol succinate  25 mg Oral Daily  . polyethylene glycol  17 g Oral Daily  . senna-docusate  2 tablet Oral QHS   Continuous Infusions: . sodium chloride 250 mL (05/02/18 1017)  . piperacillin-tazobactam (ZOSYN)  IV 3.375 g (05/02/18 1022)     LOS: 4 days    Time spent in minutes: 35    Debbe Odea, MD [facility] Pager: [email or web address] Password [email or web address] 05/02/2018, 3:24 PM

## 2018-05-02 NOTE — Transfer of Care (Signed)
Immediate Anesthesia Transfer of Care Note  Patient: [name]  Procedure(s) Performed: REMOVAL PORT-A-CATH (N/A Chest)  Patient Location: PACU  Anesthesia Type:General  Level of Consciousness: awake, alert , oriented and patient cooperative  Airway & Oxygen Therapy: Patient Spontanous Breathing and Patient connected to face mask oxygen  Post-op Assessment: Report given to RN, Post -op Vital signs reviewed and stable and Patient moving all extremities X 4  Post vital signs: stable  Last Vitals:  Vitals Value Taken Time  BP 122/69 05/02/2018  9:06 AM  Temp    Pulse 66 05/02/2018  9:07 AM  Resp 17 05/02/2018  9:07 AM  SpO2 100 % 05/02/2018  9:07 AM  Vitals shown include unvalidated device data.  Last Pain:  Vitals:   05/02/18 0643  TempSrc:   PainSc: 7       Patients Stated Pain Goal: 0 (95/28/41 3244)  Complications: No apparent anesthesia complications

## 2018-05-02 NOTE — Op Note (Signed)
Operative Note  COURTNEE MYER  [id number]  [id number]  05/02/2018   Surgeon: Vikki Ports A ConnorMD  Assistant: none  Procedure performed: removal of left chest port-a-cath  Preop diagnosis: bacteremia  Post-op diagnosis/intraop findings: same  Specimens: port tip sent for culture Retained items: no EBL: minimal cc Complications: none  Description of procedure: After obtaining informed consent the patient was taken to the operating room and placed supine on operating room table wheregeneral LMA anesthesia was initiated, SCDs applied, and a formal timeout was performed. She is receiving scheduled zosyn. The left chest was prepped and draped in the usual sterile fashion. After infiltration with 0.25% marcaine with epinephrine an incision was made sharply and then continued with cautery until the port could be delivered into the field. The capsule and all 3 securing sutures were excised along with the port. The tubing was pulled with some resistance noted but ultimately came out intact. Pressure was held on the entry point to the subclavian vein for about 3 minutes. Hemostasis was then ensured within the wound which was closed with deep dermal 3-0 vicryl and running subcuticular monocryl. A light pressure dressing of gauze and tegaderm were then applied. The patient was then awakened, extubated and taken to PACU in stable condition.   All counts were correct at the completion of the case.

## 2018-05-02 NOTE — Progress Notes (Signed)
S: No acute events.   Vitals, labs, intake/output, and orders reviewed at this time.  Gen: A&Ox3, no distress  H&N: EOMI, atraumatic, neck supple Chest: unlabored respirations, RRR, left chest port in place Abd: soft, tender, nondistended Ext: warm, no edema Neuro: grossly normal  Lines/tubes/drains:  Port, PIV  A/P:  To OR for removal of port for bacteremia. I discussed the procedure with her including risks of bleeding, infection, pain, scarring. Questions welcomed and answered. Proceed to OR this morning.    Romana Juniper, MD [facility], [place] Pager [phone]

## 2018-05-02 NOTE — Anesthesia Postprocedure Evaluation (Signed)
Anesthesia Post Note  Patient: [name]  Procedure(s) Performed: REMOVAL PORT-A-CATH (N/A Chest)     Patient location during evaluation: PACU Anesthesia Type: General Level of consciousness: awake Pain management: pain level controlled Vital Signs Assessment: post-procedure vital signs reviewed and stable Respiratory status: spontaneous breathing Cardiovascular status: stable Postop Assessment: no headache Anesthetic complications: no    Last Vitals:  Vitals:   05/02/18 0955 05/02/18 1034  BP: 131/81 (!) 143/74  Pulse: (!) 59 (!) 58  Resp: 19 20  Temp: 36.7 C 36.8 C  SpO2: 96% 99%    Last Pain:  Vitals:   05/02/18 1034  TempSrc:   PainSc: 8                  Kavon Valenza

## 2018-05-03 ENCOUNTER — Other Ambulatory Visit: Payer: Self-pay | Admitting: Oncology

## 2018-05-03 ENCOUNTER — Encounter (HOSPITAL_COMMUNITY): Payer: Self-pay | Admitting: Surgery

## 2018-05-03 LAB — CBC
HCT: 26.1 % — ABNORMAL LOW (ref 36.0–46.0)
Hemoglobin: 8.8 g/dL — ABNORMAL LOW (ref 12.0–15.0)
MCH: 29.5 pg (ref 26.0–34.0)
MCHC: 33.7 g/dL (ref 30.0–36.0)
MCV: 87.6 fL (ref 78.0–100.0)
PLATELETS: 184 10*3/uL (ref 150–400)
RBC: 2.98 MIL/uL — AB (ref 3.87–5.11)
RDW: 16.3 % — AB (ref 11.5–15.5)
WBC: 17.2 10*3/uL — ABNORMAL HIGH (ref 4.0–10.5)

## 2018-05-03 MED ORDER — SIMETHICONE 80 MG PO CHEW
80.0000 mg | CHEWABLE_TABLET | Freq: Four times a day (QID) | ORAL | Status: DC
  Administered 2018-05-03 – 2018-05-05 (×9): 80 mg via ORAL
  Filled 2018-05-03 (×9): qty 1

## 2018-05-03 NOTE — Progress Notes (Signed)
PROGRESS NOTE    [name]   EPP:[id number]  DOB: 11/12/1945  DOA: 04/28/2018 PCP: Chesley Noon, MD   Brief Narrative:  [name] is a 72 y/o female with pancreatic cancer status post Whipple resection and [name], internal/external biliary drain on 12/05/2017, small bowel stent placement May 2019, A. fib, obesity, sleep apnea, PE and hypertension, who presented to the ER on 04/28/2018 with nausea and vomiting & abdominal pain. Last chemotherapy last on 04/22/2018 Found to have elevated LFTs and WBC count in the 20s and admitted for possible cholangitis and started on Zosyn. GI consulted. GI subsequently requested an ID eval.   Subjective: She had cramping upper abdominal pain - taking Hydrocodone for it. Tolerating food without vomiting. No diarrhea.     Assessment & Plan:   Principal Problem: Elevated LFTs, leukocytosis and abdominal pain - Klebsiella bacteremia - she previously had K pneumoniae bacteremia in 4/19 - ? Cholangitis- initial stent placed in 9/15 - ID recommends port to be removed which has been done on 8/10 - cont anibiotoics per ID-  Dr Drucilla Schmidt is recommending PICC after catheter holiday and subsequently 2 wk course of IV treament -  appreciate ID and GI evals  - cont Hydrocodone PRN    Active Problems:     HTN (hypertension)/ PAF - HR in 67s- Toprol with holding parameters -  Xarelto on hold for possible procedures -   can resume today as port has been removed and no other major procedures planned- I discussed this with Dr Kae Heller- cont Flecainide    Adenocarcinoma of head of pancreas  - Dr Ammie Dalton has evaluated the patient and is following    Protein-calorie malnutrition, severe  - cont supplements recommended by nutrionist    DVT prophylaxis: SCDs Code Status: full code Family Communication:  Disposition Plan: home when stable Consultants:   Medical oncology  GI  ID Procedures:    Port removal   Antimicrobials:    Anti-infectives (From admission, onward)   Start     Dose/Rate Route Frequency Ordered Stop   04/29/18 0200  piperacillin-tazobactam (ZOSYN) IVPB 3.375 g     3.375 g 12.5 mL/hr over 240 Minutes Intravenous Every 8 hours 04/29/18 0149         Objective: Vitals:   05/02/18 1411 05/02/18 1953 05/03/18 0446 05/03/18 1248  BP: 117/84 102/67 134/81 133/85  Pulse: 63 (!) 57 66 65  Resp: 16 16 20 16   Temp: 97.8 F (36.6 C) 98.4 F (36.9 C) 98 F (36.7 C) 98.2 F (36.8 C)  TempSrc: Oral Oral Oral Oral  SpO2: 99% 99% 100% 100%    Intake/Output Summary (Last 24 hours) at 05/03/2018 1514 Last data filed at 05/03/2018 1058 Gross per 24 hour  Intake 1961.06 ml  Output 1100 ml  Net 861.06 ml   There were no vitals filed for this visit.  Examination: General exam: Appears comfortable  HEENT: PERRLA, oral mucosa moist, no sclera icterus or thrush Respiratory system: Clear to auscultation. Respiratory effort normal. Cardiovascular system: S1 & S2 heard, RRR.   Gastrointestinal system: Abdomen soft,  Tenderness in epigastrium and LUQ today, nondistended. Normal bowel sound. No organomegaly Central nervous system: Alert and oriented. No focal neurological deficits. Extremities: No cyanosis, clubbing or edema Skin: No rashes or ulcers Psychiatry:  Mood & affect appropriate.     Data Reviewed: I have personally reviewed following labs and imaging studies  CBC: Recent Labs  Lab 04/28/18 1417 04/29/18 0500 04/30/18 0806 05/02/18 0542  05/03/18 0502  WBC 28.8* 24.4* 26.5* 15.5* 17.2*  NEUTROABS 26.1*  --   --   --   --   HGB 8.8* 8.1* 8.9* 8.3* 8.8*  HCT 26.6* 23.8* 25.8* 24.6* 26.1*  MCV 87.4 86.5 85.4 86.9 87.6  PLT 340 273 287 180 270   Basic Metabolic Panel: Recent Labs  Lab 04/26/18 1920 04/28/18 1417 04/29/18 0500 04/29/18 2030 05/02/18 1543  NA 130* 133* 136 137 139  K 3.9 4.6 3.7 3.5 3.7  CL 98 101 105 105 106  CO2 19* 22 23 22 26   GLUCOSE 92 102* 96 133* 153*   BUN 15 22 18 15 12   CREATININE 1.03* 1.19* 0.99 1.19* 1.12*  CALCIUM 8.8* 8.9 8.4* 8.5* 8.2*  MG  --   --  1.9  --   --   PHOS  --   --  3.6  --   --    GFR: Estimated Creatinine Clearance: 36.4 mL/min (A) (by C-G formula based on SCr of 1.12 mg/dL (H)). Liver Function Tests: Recent Labs  Lab 04/26/18 1920 04/28/18 1417 04/29/18 0500 04/29/18 2030 05/02/18 1543  AST 41 440* 244* 156* 45*  ALT 29 213* 156* 126* 58*  ALKPHOS 318* 922* 702* 657* 435*  BILITOT 0.8 1.3* 0.7 0.6 0.4  PROT 6.6 6.7 6.0* 6.0* 5.3*  ALBUMIN 3.1* 3.1* 3.0* 3.1* 2.5*   Recent Labs  Lab 04/26/18 1920  LIPASE 29   No results for input(s): AMMONIA in the last 168 hours. Coagulation Profile: No results for input(s): INR, PROTIME in the last 168 hours. Cardiac Enzymes: No results for input(s): CKTOTAL, CKMB, CKMBINDEX, TROPONINI in the last 168 hours. BNP (last 3 results) Recent Labs    12/19/17 1257  PROBNP 103   HbA1C: No results for input(s): HGBA1C in the last 72 hours. CBG: No results for input(s): GLUCAP in the last 168 hours. Lipid Profile: No results for input(s): CHOL, HDL, LDLCALC, TRIG, CHOLHDL, LDLDIRECT in the last 72 hours. Thyroid Function Tests: No results for input(s): TSH, T4TOTAL, FREET4, T3FREE, THYROIDAB in the last 72 hours. Anemia Panel: No results for input(s): VITAMINB12, FOLATE, FERRITIN, TIBC, IRON, RETICCTPCT in the last 72 hours. Urine analysis:    Component Value Date/Time   COLORURINE STRAW (A) 04/26/2018 1939   APPEARANCEUR CLEAR 04/26/2018 1939   LABSPEC 1.004 (L) 04/26/2018 1939   LABSPEC 1.005 04/21/2014 1713   PHURINE 5.0 04/26/2018 1939   GLUCOSEU NEGATIVE 04/26/2018 1939   GLUCOSEU Negative 04/21/2014 1713   HGBUR NEGATIVE 04/26/2018 1939   BILIRUBINUR NEGATIVE 04/26/2018 1939   BILIRUBINUR Negative 04/21/2014 1713   KETONESUR NEGATIVE 04/26/2018 1939   PROTEINUR NEGATIVE 04/26/2018 1939   UROBILINOGEN 0.2 10/16/2014 1035   UROBILINOGEN 0.2  04/21/2014 1713   NITRITE NEGATIVE 04/26/2018 1939   LEUKOCYTESUR NEGATIVE 04/26/2018 1939   LEUKOCYTESUR Trace 04/21/2014 1713   Sepsis Labs: @LABRCNTIP (procalcitonin:4,lacticidven:4) ) Recent Results (from the past 240 hour(s))  Culture, Blood     Status: Abnormal (Preliminary result)   Collection Time: 04/28/18  4:07 PM  Result Value Ref Range Status   Specimen Description   Final    PORTA CATH Performed at [facility], [place] [address], [place], [place] [address]    Special Requests   Final    BOTTLES DRAWN AEROBIC AND ANAEROBIC Blood Culture adequate volume   Culture  Setup Time   Final    GRAM NEGATIVE RODS AEROBIC BOTTLE ONLY CRITICAL RESULT CALLED TO, READ BACK BY AND VERIFIED  WITHChristean Grief [facility] 3846 05/01/18 A BROWNING    Culture (A)  Final    KLEBSIELLA PNEUMONIAE SUSCEPTIBILITIES TO FOLLOW Performed at [facility], [place] [address], [place], [place] [address]    Report Status PENDING  Incomplete  Blood Culture ID Panel (Reflexed)     Status: Abnormal   Collection Time: 04/28/18  4:07 PM  Result Value Ref Range Status   Enterococcus species NOT DETECTED NOT DETECTED Final   Listeria monocytogenes NOT DETECTED NOT DETECTED Final   Staphylococcus species NOT DETECTED NOT DETECTED Final   Staphylococcus aureus NOT DETECTED NOT DETECTED Final   Streptococcus species NOT DETECTED NOT DETECTED Final   Streptococcus agalactiae NOT DETECTED NOT DETECTED Final   Streptococcus pneumoniae NOT DETECTED NOT DETECTED Final   Streptococcus pyogenes NOT DETECTED NOT DETECTED Final   Acinetobacter baumannii NOT DETECTED NOT DETECTED Final   Enterobacteriaceae species DETECTED (A) NOT DETECTED Final    Comment: Enterobacteriaceae represent a large family of gram-negative bacteria, not a single organism. CRITICAL RESULT CALLED TO, READ BACK BY AND VERIFIED WITH: Christean Grief PHARMD [id number] 05/01/18 A BROWNING    Enterobacter cloacae complex NOT DETECTED NOT  DETECTED Final   Escherichia coli NOT DETECTED NOT DETECTED Final   Klebsiella oxytoca NOT DETECTED NOT DETECTED Final   Klebsiella pneumoniae DETECTED (A) NOT DETECTED Final    Comment: CRITICAL RESULT CALLED TO, READ BACK BY AND VERIFIED WITH: Christean Grief PHARMD [id number] 05/01/18 A BROWNING    Proteus species NOT DETECTED NOT DETECTED Final   Serratia marcescens NOT DETECTED NOT DETECTED Final   Carbapenem resistance NOT DETECTED NOT DETECTED Final   Haemophilus influenzae NOT DETECTED NOT DETECTED Final   Neisseria meningitidis NOT DETECTED NOT DETECTED Final   Pseudomonas aeruginosa NOT DETECTED NOT DETECTED Final   Candida albicans NOT DETECTED NOT DETECTED Final   Candida glabrata NOT DETECTED NOT DETECTED Final   Candida krusei NOT DETECTED NOT DETECTED Final   Candida parapsilosis NOT DETECTED NOT DETECTED Final   Candida tropicalis NOT DETECTED NOT DETECTED Final    Comment: Performed at [facility], [place] [address] [place], [place] [address]  Surgical pcr screen     Status: None   Collection Time: 05/02/18  2:17 AM  Result Value Ref Range Status   MRSA, PCR NEGATIVE NEGATIVE Final   Staphylococcus aureus NEGATIVE NEGATIVE Final    Comment: (NOTE) The Xpert SA Assay (FDA approved for NASAL specimens in patients 59 years of age and older), is one component of a comprehensive surveillance program. It is not intended to diagnose infection nor to guide or monitor treatment. Performed at [facility], [place] [address] [place], [place] [address]          Radiology Studies: No results found.    Scheduled Meds: . feeding supplement  1 Container Oral TID BM  . flecainide  100 mg Oral BID  . metoprolol succinate  25 mg Oral Daily  . polyethylene glycol  17 g Oral Daily  . rivaroxaban  15 mg Oral Q supper  . senna-docusate  2 tablet Oral QHS  . simethicone  80 mg Oral QID   Continuous Infusions: . sodium chloride 20 mL/hr at 05/03/18 0442  .  piperacillin-tazobactam (ZOSYN)  IV 3.375 g (05/03/18 0911)     LOS: 5 days    Time spent in minutes: 35    Debbe Odea, MD [facility] Pager: [email or web address] Password [facility] 05/03/2018, 3:14 PM

## 2018-05-03 NOTE — Progress Notes (Signed)
1 Day Post-Op   Subjective/Chief Complaint: No issues   Objective: Vital signs in last 24 hours: Temp:  [97.8 F (36.6 C)-98.4 F (36.9 C)] 98 F (36.7 C) (08/11 0446) Pulse Rate:  [50-69] 66 (08/11 0446) Resp:  [14-20] 20 (08/11 0446) BP: (102-143)/(65-84) 134/81 (08/11 0446) SpO2:  [96 %-100 %] 100 % (08/11 0446) Last BM Date: 05/02/18  Intake/Output from previous day: 08/10 0701 - 08/11 0700 In: 2736.2 [P.O.:1440; I.V.:1077.9; IV Piggyback:218.3] Out: 1105 [Urine:1100; Blood:5] Intake/Output this shift: No intake/output data recorded.  No swelling or tenderness at port removal site  Lab Results:  Recent Labs    05/02/18 0542 05/03/18 0502  WBC 15.5* 17.2*  HGB 8.3* 8.8*  HCT 24.6* 26.1*  PLT 180 184   BMET Recent Labs    05/02/18 1543  NA 139  K 3.7  CL 106  CO2 26  GLUCOSE 153*  BUN 12  CREATININE 1.12*  CALCIUM 8.2*   PT/INR No results for input(s): LABPROT, INR in the last 72 hours. ABG No results for input(s): PHART, HCO3 in the last 72 hours.  Invalid input(s): PCO2, PO2  Studies/Results: No results found.  Anti-infectives: Anti-infectives (From admission, onward)   Start     Dose/Rate Route Frequency Ordered Stop   04/29/18 0200  piperacillin-tazobactam (ZOSYN) IVPB 3.375 g     3.375 g 12.5 mL/hr over 240 Minutes Intravenous Every 8 hours 04/29/18 0149        Assessment/Plan: s/p Procedure(s): REMOVAL PORT-A-CATH (N/A) Dry dressing for another 48h then can leave open to air, ok to cover with dry dressing if desired. OK from surgery standpoint to start xarelto today. Surgery will sign off, please call with questions or concerns  LOS: 5 days    [name] 05/03/2018

## 2018-05-04 ENCOUNTER — Other Ambulatory Visit: Payer: Self-pay

## 2018-05-04 ENCOUNTER — Inpatient Hospital Stay: Payer: Self-pay

## 2018-05-04 DIAGNOSIS — G893 Neoplasm related pain (acute) (chronic): Secondary | ICD-10-CM

## 2018-05-04 DIAGNOSIS — Z90411 Acquired partial absence of pancreas: Secondary | ICD-10-CM

## 2018-05-04 DIAGNOSIS — C259 Malignant neoplasm of pancreas, unspecified: Secondary | ICD-10-CM

## 2018-05-04 DIAGNOSIS — B9689 Other specified bacterial agents as the cause of diseases classified elsewhere: Secondary | ICD-10-CM

## 2018-05-04 DIAGNOSIS — D701 Agranulocytosis secondary to cancer chemotherapy: Secondary | ICD-10-CM

## 2018-05-04 DIAGNOSIS — Z86711 Personal history of pulmonary embolism: Secondary | ICD-10-CM

## 2018-05-04 DIAGNOSIS — T451X5A Adverse effect of antineoplastic and immunosuppressive drugs, initial encounter: Secondary | ICD-10-CM

## 2018-05-04 LAB — CULTURE, BLOOD (SINGLE): SPECIAL REQUESTS: ADEQUATE

## 2018-05-04 NOTE — Progress Notes (Signed)
PROGRESS NOTE    [name]   GLO:[id number]  DOB: 1946-03-22  DOA: 04/28/2018 PCP: Chesley Noon, MD   Brief Narrative:  [name] is a 72 y/o female with pancreatic cancer status post Whipple resection and choledochojejunostomy, internal/external biliary drain on 12/05/2017, small bowel stent placement May 2019, A. fib, obesity, sleep apnea, PE and hypertension, who presented to the ER on 04/28/2018 with nausea and vomiting & abdominal pain. Last chemotherapy last on 04/22/2018 Found to have elevated LFTs and WBC count in the 20s and admitted for possible cholangitis and started on Zosyn. GI consulted. GI subsequently requested an ID eval.   Subjective: No new complaints. Cramping abdominal pain is on and off and improved with Hydrocodone.  She has had a couple of nose bleeds since yesterday. She "feels there is a clot" now in her nose.  Assessment & Plan:   Principal Problem: Elevated LFTs, leukocytosis and abdominal pain - Klebsiella bacteremia - she previously had K pneumoniae bacteremia in 4/19 - ? Cholangitis- initial stent placed in 9/15 - ID recommends port to be removed which was done on 8/10 - cont anibiotoics per ID-  Dr Drucilla Schmidt is recommending PICC after catheter holiday and subsequently 2 wk course of IV treament -  appreciate ID and GI evals  - cont Hydrocodone PRN - awaiting ID to let us know when PICC can be placed    Active Problems:     HTN (hypertension)/ PAF - cont Toprol with holding parameters -  Xarelto on hold for possible procedures -   Resumed on 8/11 as port has been removed and no other major procedures planned - cont Flecainide  Nose bleed- left nares -resolved for now- not a frequent issue- follow    Adenocarcinoma of head of pancreas  - Dr Ammie Dalton has evaluated the patient and is following    Protein-calorie malnutrition, severe  - cont supplements recommended by nutrionist    DVT prophylaxis: SCDs Code Status: full  code Family Communication:  Disposition Plan: home when stable Consultants:   Medical oncology  GI  ID Procedures:    Port removal   Antimicrobials:  Anti-infectives (From admission, onward)   Start     Dose/Rate Route Frequency Ordered Stop   04/29/18 0200  piperacillin-tazobactam (ZOSYN) IVPB 3.375 g     3.375 g 12.5 mL/hr over 240 Minutes Intravenous Every 8 hours 04/29/18 0149         Objective: Vitals:   05/03/18 1248 05/03/18 1500 05/03/18 2154 05/04/18 0642  BP: 133/85  132/71 (!) 148/82  Pulse: 65  66 64  Resp: 16  20 20   Temp: 98.2 F (36.8 C)  98.8 F (37.1 C) 98.5 F (36.9 C)  TempSrc: Oral  Oral Oral  SpO2: 100%  100% 99%  Weight:  56.7 kg    Height:  5\' 2"  (1.575 m)      Intake/Output Summary (Last 24 hours) at 05/04/2018 1213 Last data filed at 05/04/2018 0600 Gross per 24 hour  Intake 725.79 ml  Output -  Net 725.79 ml   Filed Weights   05/03/18 1500  Weight: 56.7 kg    Examination: General exam: Appears comfortable  HEENT: PERRLA, oral mucosa moist, no sclera icterus or thrush- small amount of blood present on left nares Respiratory system: Clear to auscultation. Respiratory effort normal. Cardiovascular system: S1 & S2 heard, RRR.   Gastrointestinal system: Abdomen soft,  Mild tenderness in epigastrium, nondistended. Normal bowel sound. No organomegaly Central nervous system:  Alert and oriented. No focal neurological deficits. Extremities: No cyanosis, clubbing or edema Skin: No rashes or ulcers Psychiatry:  Mood & affect appropriate.     Data Reviewed: I have personally reviewed following labs and imaging studies  CBC: Recent Labs  Lab 04/28/18 1417 04/29/18 0500 04/30/18 0806 05/02/18 0542 05/03/18 0502  WBC 28.8* 24.4* 26.5* 15.5* 17.2*  NEUTROABS 26.1*  --   --   --   --   HGB 8.8* 8.1* 8.9* 8.3* 8.8*  HCT 26.6* 23.8* 25.8* 24.6* 26.1*  MCV 87.4 86.5 85.4 86.9 87.6  PLT 340 273 287 180 076   Basic Metabolic  Panel: Recent Labs  Lab 04/28/18 1417 04/29/18 0500 04/29/18 2030 05/02/18 1543  NA 133* 136 137 139  K 4.6 3.7 3.5 3.7  CL 101 105 105 106  CO2 22 23 22 26   GLUCOSE 102* 96 133* 153*  BUN 22 18 15 12   CREATININE 1.19* 0.99 1.19* 1.12*  CALCIUM 8.9 8.4* 8.5* 8.2*  MG  --  1.9  --   --   PHOS  --  3.6  --   --    GFR: Estimated Creatinine Clearance: 36.4 mL/min (A) (by C-G formula based on SCr of 1.12 mg/dL (H)). Liver Function Tests: Recent Labs  Lab 04/28/18 1417 04/29/18 0500 04/29/18 2030 05/02/18 1543  AST 440* 244* 156* 45*  ALT 213* 156* 126* 58*  ALKPHOS 922* 702* 657* 435*  BILITOT 1.3* 0.7 0.6 0.4  PROT 6.7 6.0* 6.0* 5.3*  ALBUMIN 3.1* 3.0* 3.1* 2.5*   No results for input(s): LIPASE, AMYLASE in the last 168 hours. No results for input(s): AMMONIA in the last 168 hours. Coagulation Profile: No results for input(s): INR, PROTIME in the last 168 hours. Cardiac Enzymes: No results for input(s): CKTOTAL, CKMB, CKMBINDEX, TROPONINI in the last 168 hours. BNP (last 3 results) Recent Labs    12/19/17 1257  PROBNP 103   HbA1C: No results for input(s): HGBA1C in the last 72 hours. CBG: No results for input(s): GLUCAP in the last 168 hours. Lipid Profile: No results for input(s): CHOL, HDL, LDLCALC, TRIG, CHOLHDL, LDLDIRECT in the last 72 hours. Thyroid Function Tests: No results for input(s): TSH, T4TOTAL, FREET4, T3FREE, THYROIDAB in the last 72 hours. Anemia Panel: No results for input(s): VITAMINB12, FOLATE, FERRITIN, TIBC, IRON, RETICCTPCT in the last 72 hours. Urine analysis:    Component Value Date/Time   COLORURINE STRAW (A) 04/26/2018 1939   APPEARANCEUR CLEAR 04/26/2018 1939   LABSPEC 1.004 (L) 04/26/2018 1939   LABSPEC 1.005 04/21/2014 1713   PHURINE 5.0 04/26/2018 1939   GLUCOSEU NEGATIVE 04/26/2018 1939   GLUCOSEU Negative 04/21/2014 1713   HGBUR NEGATIVE 04/26/2018 1939   BILIRUBINUR NEGATIVE 04/26/2018 1939   BILIRUBINUR Negative  04/21/2014 1713   KETONESUR NEGATIVE 04/26/2018 1939   PROTEINUR NEGATIVE 04/26/2018 1939   UROBILINOGEN 0.2 10/16/2014 1035   UROBILINOGEN 0.2 04/21/2014 1713   NITRITE NEGATIVE 04/26/2018 1939   LEUKOCYTESUR NEGATIVE 04/26/2018 1939   LEUKOCYTESUR Trace 04/21/2014 1713   Sepsis Labs: @LABRCNTIP (procalcitonin:4,lacticidven:4) ) Recent Results (from the past 240 hour(s))  Culture, Blood     Status: Abnormal   Collection Time: 04/28/18  4:07 PM  Result Value Ref Range Status   Specimen Description   Final    PORTA CATH Performed at [facility], [place] [address], [place], [place] [address]    Special Requests   Final    BOTTLES DRAWN AEROBIC AND ANAEROBIC Blood Culture adequate volume  Culture  Setup Time   Final    GRAM NEGATIVE RODS AEROBIC BOTTLE ONLY CRITICAL RESULT CALLED TO, READ BACK BY AND VERIFIED WITHChristean Grief [facility] 0938 05/01/18 A BROWNING Performed at [facility] [address] [place], [place] [address]    Culture KLEBSIELLA PNEUMONIAE (A)  Final   Report Status 05/04/2018 FINAL  Final   Organism ID, Bacteria KLEBSIELLA PNEUMONIAE  Final      Susceptibility   Klebsiella pneumoniae - MIC*    AMPICILLIN >=32 RESISTANT Resistant     CEFAZOLIN <=4 SENSITIVE Sensitive     CEFEPIME <=1 SENSITIVE Sensitive     CEFTAZIDIME <=1 SENSITIVE Sensitive     CEFTRIAXONE <=1 SENSITIVE Sensitive     CIPROFLOXACIN <=0.25 SENSITIVE Sensitive     GENTAMICIN <=1 SENSITIVE Sensitive     IMIPENEM <=0.25 SENSITIVE Sensitive     TRIMETH/SULFA <=20 SENSITIVE Sensitive     AMPICILLIN/SULBACTAM 4 SENSITIVE Sensitive     PIP/TAZO <=4 SENSITIVE Sensitive     Extended ESBL NEGATIVE Sensitive     * KLEBSIELLA PNEUMONIAE  Blood Culture ID Panel (Reflexed)     Status: Abnormal   Collection Time: 04/28/18  4:07 PM  Result Value Ref Range Status   Enterococcus species NOT DETECTED NOT DETECTED Final   Listeria monocytogenes NOT DETECTED NOT DETECTED Final    Staphylococcus species NOT DETECTED NOT DETECTED Final   Staphylococcus aureus NOT DETECTED NOT DETECTED Final   Streptococcus species NOT DETECTED NOT DETECTED Final   Streptococcus agalactiae NOT DETECTED NOT DETECTED Final   Streptococcus pneumoniae NOT DETECTED NOT DETECTED Final   Streptococcus pyogenes NOT DETECTED NOT DETECTED Final   Acinetobacter baumannii NOT DETECTED NOT DETECTED Final   Enterobacteriaceae species DETECTED (A) NOT DETECTED Final    Comment: Enterobacteriaceae represent a large family of gram-negative bacteria, not a single organism. CRITICAL RESULT CALLED TO, READ BACK BY AND VERIFIED WITH: Christean Grief [facility] [id number] 05/01/18 A BROWNING    Enterobacter cloacae complex NOT DETECTED NOT DETECTED Final   Escherichia coli NOT DETECTED NOT DETECTED Final   Klebsiella oxytoca NOT DETECTED NOT DETECTED Final   Klebsiella pneumoniae DETECTED (A) NOT DETECTED Final    Comment: CRITICAL RESULT CALLED TO, READ BACK BY AND VERIFIED WITH: Christean Grief [facility] [id number] 05/01/18 A BROWNING    Proteus species NOT DETECTED NOT DETECTED Final   Serratia marcescens NOT DETECTED NOT DETECTED Final   Carbapenem resistance NOT DETECTED NOT DETECTED Final   Haemophilus influenzae NOT DETECTED NOT DETECTED Final   Neisseria meningitidis NOT DETECTED NOT DETECTED Final   Pseudomonas aeruginosa NOT DETECTED NOT DETECTED Final   Candida albicans NOT DETECTED NOT DETECTED Final   Candida glabrata NOT DETECTED NOT DETECTED Final   Candida krusei NOT DETECTED NOT DETECTED Final   Candida parapsilosis NOT DETECTED NOT DETECTED Final   Candida tropicalis NOT DETECTED NOT DETECTED Final    Comment: Performed at [facility], [place] [address], [place], [place] [address]  Surgical pcr screen     Status: None   Collection Time: 05/02/18  2:17 AM  Result Value Ref Range Status   MRSA, PCR NEGATIVE NEGATIVE Final   Staphylococcus aureus NEGATIVE NEGATIVE Final    Comment: (NOTE) The Xpert SA Assay (FDA  approved for NASAL specimens in patients 30 years of age and older), is one component of a comprehensive surveillance program. It is not intended to diagnose infection nor to guide or monitor treatment. Performed at [facility],  [place] [address], [place], [place] [address]   Aerobic Culture (superficial specimen)     Status: None (Preliminary result)   Collection Time: 05/02/18  8:44 AM  Result Value Ref Range Status   Specimen Description   Final    TISSUE PORTA CATH Performed at [facility], [place] [address], [place], [place] [address]    Special Requests   Final    NONE Performed at [facility], [place] [address], [place], [place] [address]    Gram Stain   Final    RARE WBC PRESENT,BOTH PMN AND MONONUCLEAR NO ORGANISMS SEEN    Culture   Final    NO GROWTH < 24 HOURS Performed at [facility], [place] [address], [place],  [address]    Report Status PENDING  Incomplete         Radiology Studies: No results found.    Scheduled Meds: . feeding supplement  1 Container Oral TID BM  . flecainide  100 mg Oral BID  . metoprolol succinate  25 mg Oral Daily  . polyethylene glycol  17 g Oral Daily  . rivaroxaban  15 mg Oral Q supper  . senna-docusate  2 tablet Oral QHS  . simethicone  80 mg Oral QID   Continuous Infusions: . sodium chloride 10 mL/hr at 05/04/18 0600  . piperacillin-tazobactam (ZOSYN)  IV 3.375 g (05/04/18 0859)     LOS: 6 days    Time spent in minutes: 35    Debbe Odea, MD [facility] Pager: [email or web address] Password [facility] 05/04/2018, 12:13 PM

## 2018-05-04 NOTE — Progress Notes (Signed)
These preliminary result these preliminary results were noted.  Awaiting final report.

## 2018-05-04 NOTE — Progress Notes (Signed)
[place] for Infectious Disease    Date of Admission:  04/28/2018   Total days of antibiotics 7 piptazo           ID: [name] is a 72 y.o. female with Stage IV adenocarcinoma of pancreas sp Whipples, chemotherapy, with port, GNR bacteremia in April when she had cholangitis, now admitted with severe abdominal pain and obstructive hepatitis. She has leukocytosis and malaise, and fatigue but no fever. She received neulasta with last dose of chemotherapy.  She now has recurrent GNR bacteremia with Klebsiella PNA growing from blood yet again. Organism ID by BCID and GNR growing but sensis not back yet  Principal Problem:   Cholangiolitis Active Problems:   HTN (hypertension)   OSA (obstructive sleep apnea)   Adenocarcinoma of head of pancreas (HCC)   Protein-calorie malnutrition, severe (HCC)   Dehydration   Paroxysmal atrial fibrillation (HCC)   Orthostatic hypotension   Elevated LFTs   Malnutrition of moderate degree   Bacteremia due to Klebsiella pneumoniae   Infection of venous access port   Leucopenia   Cholangitis  Subjective: Underwent removal of port-a-cath 2 days ago, remains afebrile. No growth on culture. No diarrhea  Medications:  . feeding supplement  1 Container Oral TID BM  . flecainide  100 mg Oral BID  . metoprolol succinate  25 mg Oral Daily  . polyethylene glycol  17 g Oral Daily  . rivaroxaban  15 mg Oral Q supper  . senna-docusate  2 tablet Oral QHS  . simethicone  80 mg Oral QID    Objective: Vital signs in last 24 hours: Temp:  [98.5 F (36.9 C)-98.8 F (37.1 C)] 98.5 F (36.9 C) (08/12 0642) Pulse Rate:  [64-66] 64 (08/12 0642) Resp:  [20] 20 (08/12 0642) BP: (132-148)/(71-82) 148/82 (08/12 0642) SpO2:  [99 %-100 %] 99 % (08/12 0258) Physical Exam  Constitutional:  oriented to person, place, and time. appears well-developed and well-nourished. No distress.  HENT: [place]/AT, PERRLA, no scleral icterus Mouth/Throat: Oropharynx is  clear and moist. No oropharyngeal exudate.  Cardiovascular: Normal rate, regular rhythm and normal heart sounds. Exam reveals no gallop and no friction rub.  No murmur heard.  Pulmonary/Chest: Effort normal and breath sounds normal. No respiratory distress.  has no wheezes.  Neck = supple, no nuchal rigidity Abdominal: Soft. Bowel sounds are normal.  exhibits no distension. There is no tenderness.  Lymphadenopathy: no cervical adenopathy. No axillary adenopathy Neurological: alert and oriented to person, place, and time.  Skin: Skin is warm and dry. No rash noted. No erythema.  Psychiatric: a normal mood and affect.  behavior is normal.     Lab Results Recent Labs    05/02/18 0542 05/02/18 1543 05/03/18 0502  WBC 15.5*  --  17.2*  HGB 8.3*  --  8.8*  HCT 24.6*  --  26.1*  NA  --  139  --   K  --  3.7  --   CL  --  106  --   CO2  --  26  --   BUN  --  12  --   CREATININE  --  1.12*  --    Liver Panel Recent Labs    05/02/18 1543  PROT 5.3*  ALBUMIN 2.5*  AST 45*  ALT 58*  ALKPHOS 435*  BILITOT 0.4    Microbiology: 8/10 tissue cx WBC, pending 8/6 blood cx kleb pneumo (amp R) Studies/Results: No results found.  Assessment/Plan: Cholangitis with [name]  bacteremia with suspected portacath involvement = continue with abtx, recommend 2 wk since removal line. Can place picc line tomorrow. Would discharge on a total of 14 days of Iv abtx using 8/11 as day 1.   Can switch piptazo to amp/sub which would provide anaerobic coverage.  [facility] Cell: [phone] Pager: [phone]  05/04/2018, 3:39 PM

## 2018-05-04 NOTE — Progress Notes (Signed)
IP PROGRESS NOTE  Subjective:   [name] reports improvement in her headache and nausea.  She continues to have abdominal pain.  She underwent removal of the Port-A-Cath on 05/02/2018.  She reports a nosebleed yesterday and this morning.  No other bleeding. Objective: Vital signs in last 24 hours: Blood pressure (!) 148/82, pulse 64, temperature 98.5 F (36.9 C), temperature source Oral, resp. rate 20, height 5\' 2"  (1.575 m), weight 125 lb (56.7 kg), SpO2 99 %.  Intake/Output from previous day: 08/11 0701 - 08/12 0700 In: 1205.8 [P.O.:840; I.V.:215.8; IV Piggyback:150] Out: -   Physical Exam:  HEENT: No thrush or ulcers, mouth without bleeding Lungs: Clear bilaterally Cardiac: Regular rate and rhythm Abdomen: Soft, tender throughout the abdomen Extremities: No leg edema  Portacath site with a gauze dressing Lab Results: Recent Labs    05/02/18 0542 05/03/18 0502  WBC 15.5* 17.2*  HGB 8.3* 8.8*  HCT 24.6* 26.1*  PLT 180 184    BMET Recent Labs    05/02/18 1543  NA 139  K 3.7  CL 106  CO2 26  GLUCOSE 153*  BUN 12  CREATININE 1.12*  CALCIUM 8.2*  05/02/2018: Alkaline phosphatase 435, AST 45, ALT 58, bilirubin 0.4  Medications: I have reviewed the patient's current medications.  Assessment/Plan:  1. 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  Gemcitabine held 11/02/2014 due to neutropenia.  Gemcitabine 11/09/2014 dose reduced 800 mg/m.  Gemcitabine held 11/16/2014 due to neutropenia.  Gemcitabine resumed 11/23/2014 every 2 week schedule.  Cycle 6 gemcitabine 01/05/2015  Cycle 7 gemcitabine 01/18/2015  Cycle 8  gemcitabine 02/01/2015  Cycle 9 gemcitabine 02/15/2015  Cycle 10 gemcitabine 03/01/2015  Cycle 11 gemcitabine 03/15/2015  Cycle 12 gemcitabine 03/29/2015  Elevated CA 19-01 May 2016  CTs 06/17/2016-new soft tissue mass at the root of the mesentery with vascular involvement  PET 06/27/2016-hypermetabolic activity associated with soft tissue adjacent to surgical clips in the central mesentery  Status post SBRTto the mesenteric mass completed 07/26/2016  CT abdomen/pelvis 08/23/2016 -mesenteric mass stable to slightly decreased in size.  Cycle 1 FOLFOX 09/03/2016  Cycle 2 FOLFOX 09/30/2016  Cycle 3 FOLFOX 10/14/2016   Cycle 4 FOLFOX 11/05/2016 (5-FU bolus eliminated and 5-FU pump dose reduced)  Cycle 5 FOLFOX 11/19/2016  CT abdomen/pelvis 11/28/2016-decreased size of soft tissue at the small bowel mesentery, mild asymmetric soft tissue at the left vaginal cuff  Cycle 6 FOLFOX 12/10/2016 (5-FU infusion further reduced and oxaliplatin reduced)  Cycle 7 FOLFOX 12/31/2016  Cycle 8 FOLFOX 01/21/2017   CT 02/24/2017-slight decrease in size of the mesenteric mass, resolution of soft tissue fullness at the left vaginal cuff, no evidence of disease progression  CT abdomen/pelvis 06/20/2017-stable ill-defined soft tissue at the mesenteric root, mild increased asymmetry at the left vaginal cuff, no other evidence of disease progression  Elevated CA 19-9 09/11/2018  CT abdomen/pelvis 11/18/2017-no evidence of recurrent pancreas cancer, dilated afferentloop, intrahepatic biliary dilatation  PET scan 01/16/2018-low level hypermetabolic activity in the porta hepatis which appears to correspond with a mildly enlarged lymph node on MRI. No other suspicious nodal activity in the abdomen. Small right paratracheal and subcarinal lymph nodes with low-level hypermetabolic activity in the chest. No abnormal activity within the liver. Findings suspicious  for obstruction at the right  ureteropelvic junction. Asymmetric left oropharyngeal activity.  CT abdomen/pelvis 03/31/2018- significant increaseinright-sided retroperitoneal soft tissue which invades and encases the IVC, celiac trunk, SMA, duodenum and right renal pelvis. Interval progression of intrahepatic and common bile duct dilatation. New right-sided obstructive uropathy secondary to tumor invasion of the right renal pelvis.  Cycle 1 gemcitabine/Abraxane 04/08/2018  Cycle 2 gemcitabine/Abraxane 04/22/2018   2. bile duct obstruction secondary to #1, status post an ERCP with stent placement 09/23/2015hypermetabolic soft tissue in the central mesentery, no other evidence of metastatic disease, stable mildly enlarged portal caval node  3. Admission with post ERCP pancreatitis 06/16/2014  4. History of abdominal pain secondary to #1  5. Pulmonary embolism diagnosed on a CT of the abdomen 09/16/2014  Negative lower extremity Dopplers 09/17/2014  6. Multiple orthopedic surgical procedures  7. Endometrial cancer,stage IA, grade 1 endometrioid adenocarcinoma, 18% myometrial invasion, no lymphovascular space involvement, negative washings  Status post robotic total hysterectomy and bilateral salpingo-oophorectomy 11/30/2010  Recurrent tumor left lateral vagina status post biopsy 11/24/2014 with pathology confirming adenocarcinoma with focal squamous differentiation consistent with endometrial adenocarcinoma  Staging CT scans 12/06/2014 with no evidence of local pancreatic cancer recurrence. Small fluid collection adjacent to the left adrenal gland. Severe hepatic steatosis. No evidence of local extension of endometrial carcinoma. Carcinoma not well-defined at the vaginal cuff. 5 mm right external iliac lymph node. 3.6 mm left external iliac lymph node  Brachytherapy initiated 12/22/2014, completed 01/19/2015  CT abdomen/pelvis 07/24/2015 revealed a 3 x 4 cm soft tissue focus at the vaginal  apex  PET scan 08/11/2015 revealed no mass at the vaginal apex and no evidence of metastatic disease  CT 11/28/2016-mild asymmetric soft tissue at the left vaginal cuff, resolved on CT 02/24/2017  CT abdomen/pelvis 06/20/2017-stable mild ill-defined soft tissue density in the mesenteric root. Stable mild portacaval lymphadenopathy and subcentimeter right retroperitoneal lymph nodes. Mild increased size of asymmetric soft tissue density involving the left vaginal cuff.  PET scan 07/19/2017-no suspicious hypermetabolic activity within the neck, chest, abdomen or pelvis. Specifically no evidence of residual hypermetabolic tumor in the surgical bed or at the vaginal cuff. Hypermetabolic activity in the lumbar spine at the level of the right L3-4 facet joint appears degenerative.  MRI abdomen 12/02/2017-focal area of abnormal signal in the caudate lobe of the liver-unclear etiology, dilation of the hepaticojejunostomy loop, bile ducts, and pancreatic duct-stricture formation versus recurrent tumor  8. History of atrial fibrillation-maintained on xarelto  9. Family history of multiple cancers-negative CancerNext gene panel  10. Prolonged nausea following the pancreaticoduodenectomy. Improved 10/26/2014.  11. Port-A-Cath placement 10/21/2014.  12. History of Neutropenia secondary to chemotherapy   13. Diarrhea. Question pancreatic insufficiency. Pancreatic enzyme replacement initiated 01/05/2015. Recurrent diarrhea following a course of antibiotics March 2017.  14. History of positional vertigo-resolved  15. 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 Neutropenia secondary to chemotherapy, G-CSF was added with cycle 2 gemcitabine/Abraxane  17. Delayed nausea and diarrhea following FOLFOX. Emend added with cycle 2. Decadron prophylaxis  added with cycle 3  18. Diarrhea 10/28/2016. Question related to chemotherapy. Negative C. difficile testing 10/31/2016.  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  20.Admission 12/02/2017 with Bacteroides bacteremia  Biliary obstruction documented on CT/MRI 12/02/2017  Upper endoscopy 12/05/2017 revealed angulation of the apparent limb precluding intubation  Status post placement of a biliary drain 12/05/2017, replaced 12/09/2017  Bile duct brushings 12/09/2017-negative  for malignancy  Biliary drain cholangiogram 12/18/2017-there is patency of the biliary-enteric anastomosis with no contrast traverses the afferentlimb into the jejunum  Jejunal stent placed 01/28/2018, brush biopsy negative for malignancy  Biliary drain removed 02/11/2018  21.Admission 12/29/2017 through 01/02/2018 with sepsis secondary to Klebsiella bacteremia/cholangitis. 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  22.Right pelvicaliectasis on the PET scan 01/16/2018 with perinephric soft tissue fullness, the right ureter is not dilated 23.  Admission 04/28/2018 with nausea/vomiting, increased abdominal pain, and a headache  Blood culture from 04/28/2018+ for Klebsiella pneumoniae  Port-A-Cath removed 05/02/2018 24.  Elevated liver enzymes on admission 04/28/2018- likely secondary to biliary infection 25.  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  She is improved from hospital admission.  She will complete a course of outpatient antibiotics as recommended by Dr. Tommy Medal.  Ms. Dissinger is scheduled for the next cycle of chemotherapy tomorrow.  We will delay this cycle for 1 week and try treating with peripheral IV access.  We will then arrange for placement of an outpatient PICC for future chemotherapy.   Recommendations: 1.  Continue narcotic  analgesics as needed for pain 2.  Complete course of outpatient IV antibiotics as recommended by infectious disease 3.  Outpatient follow-up and chemotherapy will be scheduled for the week of 05/11/2018.      LOS: 6 days   Betsy Coder, MD   05/04/2018, 8:25 AM

## 2018-05-05 ENCOUNTER — Inpatient Hospital Stay: Payer: Medicare Other

## 2018-05-05 ENCOUNTER — Inpatient Hospital Stay: Payer: Medicare Other | Admitting: Nurse Practitioner

## 2018-05-05 DIAGNOSIS — K8309 Other cholangitis: Principal | ICD-10-CM

## 2018-05-05 MED ORDER — RIVAROXABAN 15 MG PO TABS: 30.0000 mg | ORAL_TABLET | Freq: Every day | ORAL | 0 refills | Status: DC

## 2018-05-05 MED ORDER — SODIUM CHLORIDE 0.9% FLUSH: 10.0000 mL | Freq: Two times a day (BID) | INTRAVENOUS | Status: DC

## 2018-05-05 MED ORDER — SODIUM CHLORIDE 0.9 % IV SOLN
3.0000 g | Freq: Once | INTRAVENOUS | Status: AC
  Administered 2018-05-05: 3 g via INTRAVENOUS
  Filled 2018-05-05: qty 3

## 2018-05-05 MED ORDER — AMPICILLIN-SULBACTAM IV (FOR PTA / DISCHARGE USE ONLY): 3.0000 g | Freq: Three times a day (TID) | INTRAVENOUS | 0 refills | Status: AC

## 2018-05-05 MED ORDER — SODIUM CHLORIDE 0.9% FLUSH: 10.0000 mL | INTRAVENOUS | Status: DC | PRN

## 2018-05-05 NOTE — Care Management Note (Signed)
Case Management Note  Patient Details  Name: [name] MRN: [id number] Date of Birth: 1946/07/16  Subjective/Objective:  72 yo admitted with Cholangiolitis and klebsiella bacteremia.                   Action/Plan: Pt to dc home with IV abx. Choice offered for home health and [facility] chosen. Pt has used [facility] in the past for IV infusion. [facility] rep alerted of referral. MD to write home health orders and script.  Expected Discharge Date:  (unknown)               Expected Discharge Plan:  [place]  In-House Referral:     Discharge planning Services  CM Consult  Post Acute Care Choice:  Home Health Choice offered to:  Patient  DME Arranged:    DME Agency:     HH Arranged:  RN, IV Antibiotics HH Agency:  [facility]  Status of Service:  In process, will continue to follow  If discussed at Long Length of Stay Meetings, dates discussed:    Additional Comments:  30 Day Unplanned Readmission Risk Score     Admission (Current) from 04/28/2018 in [facility]  30 Day Unplanned Readmission Risk Score (%)  31 Filed at 05/05/2018 0800     This score is the patient's risk of an unplanned readmission within 30 days of being discharged (0 -100%). The score is based on dignosis, age, lab data, medications, orders, and past utilization.   Low:  0-14.9   Medium: 15-21.9   High: 22-29.9   Extreme: 30 and above        Readmission Risk Prevention Plan 05/05/2018  Transportation Screening Complete  Medication Review Press photographer) Complete  PCP of Specialist appointment within 72 hours of discharge Complete  HRI or [place] Complete  SW Recovery Care/Counseling Consult Not Complete  SW Consult Not Complete Comments not appropriate  Palliative Care Screening Not Complete  Comments not appropriate  Medication Reconcilation (Pharmacy) Complete  SNF (preferred network) Not Complete  SNF Comments Pt for home with home health  Some  recent data might be hidden   Lynnell Catalan, RN 05/05/2018, 10:20 AM [phone]

## 2018-05-05 NOTE — Progress Notes (Signed)
Peripherally Inserted Central Catheter/Midline Placement  The IV Nurse has discussed with the patient and/or persons authorized to consent for the patient, the purpose of this procedure and the potential benefits and risks involved with this procedure.  The benefits include less needle sticks, lab draws from the catheter, and the patient may be discharged home with the catheter. Risks include, but not limited to, infection, bleeding, blood clot (thrombus formation), and puncture of an artery; nerve damage and irregular heartbeat and possibility to perform a PICC exchange if needed/ordered by physician.  Alternatives to this procedure were also discussed.  Bard Power PICC patient education guide, fact sheet on infection prevention and patient information card has been provided to patient /or left at bedside.    PICC/Midline Placement Documentation  PICC Single Lumen 81/85/63 PICC Right Basilic 34 cm 0 cm (Active)  Indication for Insertion or Continuance of Line Home intravenous therapies (PICC only) 05/05/2018  2:01 PM  Exposed Catheter (cm) 0 cm 05/05/2018  2:01 PM  Site Assessment Clean;Dry;Intact 05/05/2018  2:01 PM  Line Status Flushed;Saline locked;Blood return noted 05/05/2018  2:01 PM  Dressing Type Transparent;Securing device 05/05/2018  2:01 PM  Dressing Status Clean;Dry;Intact;Antimicrobial disc in place 05/05/2018  2:01 PM  Dressing Change Due 05/12/18 05/05/2018  2:01 PM       [name] 05/05/2018, 2:06 PM

## 2018-05-05 NOTE — Care Management Important Message (Signed)
Important Message  Patient Details  Name: [name] MRN: [id number] Date of Birth: Mar 19, 1946   Medicare Important Message Given:  Yes    Kerin Salen 05/05/2018, 10:31 AMImportant Message  Patient Details  Name: [name] MRN: [id number] Date of Birth: 1946-03-31   Medicare Important Message Given:  Yes    Kerin Salen 05/05/2018, 10:31 AM

## 2018-05-05 NOTE — Progress Notes (Signed)
PHARMACY CONSULT NOTE FOR:  OUTPATIENT  PARENTERAL ANTIBIOTIC THERAPY (OPAT)  Indication: Kleb pneumo Bacteremia Regimen: Unasyn 3gm q8  End date: 8/24  IV antibiotic discharge orders are pended. To discharging provider:  please sign these orders via discharge navigator,  Select New Orders & click on the button choice - Manage This Unsigned Work.    Thank you for allowing pharmacy to be a part of this patient's care.  Minda Ditto PharmD Pager [phone] 05/05/2018, 11:21 AM

## 2018-05-05 NOTE — Discharge Summary (Signed)
Physician Discharge Summary  [name]  XBW:[id number]  DOB: [birth year]  DOA: 04/28/2018 PCP: Chesley Noon, MD  Admit date: 04/28/2018 Discharge date: 05/05/2018  Admitted From: Home  Disposition: Home   Recommendations for Outpatient Follow-up:  1. Follow up with PCP in 1 week  2. Please obtain BMP/CBC in one week to monitor renal function and Hgb. 3. Follow up with Oncology  05/11/18 4. Complete antibiotic therapy   Home Health: RN   Discharge Condition: Home   CODE STATUS: Stable  Diet recommendation: Heart Healthy    Brief/Interim Summary: For full details see H&P/Progress note, but in brief, [name] is a 72 y/o female with pancreatic cancer status post Whipple resection and choledochojejunostomy, internal/external biliary drain on 12/05/2017, small bowel stent placement May 2019, A. fib, obesity, sleep apnea, PE and hypertension, who presented to the ER on 04/28/2018 with nausea and vomiting & abdominal pain. Last chemotherapy last on 04/22/2018 Found to have elevated LFTs and WBC count in the 20s and admitted for possible cholangitis and started on Zosyn. GI consulted. GI subsequently requested an ID eval.   Subjective: Patient seen and examined, no complaints today.  PICC line placed, tolerated well procedure.  Patient remains afebrile.  Denies nausea, vomiting and abdominal pain.  Discharge Diagnoses/Hospital Course:  Principal Problem:   Cholangiolitis Active Problems:   HTN (hypertension)   OSA (obstructive sleep apnea)   Adenocarcinoma of head of pancreas (HCC)   Protein-calorie malnutrition, severe (HCC)   Dehydration   Paroxysmal atrial fibrillation (HCC)   Orthostatic hypotension   Elevated LFTs   Malnutrition of moderate degree   Bacteremia due to Klebsiella pneumoniae   Infection of venous access port   Leucopenia   Cholangitis  Klebsiella bacteremia Felt to be related to Port-A-Cath involvement along with cholangitis, she have stent on  biliary tree.  ID was consulted and recommended Port-A-Cath removal.  She was given a line holiday for 48 hrs. Patient was treated with Zosyn during hospital stay.  Per ID recommendation can be discharged on Unasyn to complete total of 14 days.  End date 8/25.  Follow-up with PCP.  Hypertension/PAF BP stable during hospital stay, continue Xarelto Continue flecainide.  Adenocarcinoma of the pancreas (head) Follows oncology, planning for next chemotherapy on 05/11/2018  Protein calorie malnutrition Continue oral supplements.  All other chronic medical condition were stable during the hospitalization.  On the day of the discharge the patient's vitals were stable, and no other acute medical condition were reported by patient. the patient was felt safe to be discharge to home.   Discharge Instructions  You were cared for by a hospitalist during your hospital stay. If you have any questions about your discharge medications or the care you received while you were in the hospital after you are discharged, you can call the unit and asked to speak with the hospitalist on call if the hospitalist that took care of you is not available. Once you are discharged, your primary care physician will handle any further medical issues. Please note that NO REFILLS for any discharge medications will be authorized once you are discharged, as it is imperative that you return to your primary care physician (or establish a relationship with a primary care physician if you do not have one) for your aftercare needs so that they can reassess your need for medications and monitor your lab values.  Discharge Instructions    Call MD for:  difficulty breathing, headache or visual disturbances  Complete by:  As directed    Call MD for:  extreme fatigue   Complete by:  As directed    Call MD for:  hives   Complete by:  As directed    Call MD for:  persistant dizziness or light-headedness   Complete by:  As directed    Call  MD for:  persistant nausea and vomiting   Complete by:  As directed    Call MD for:  redness, tenderness, or signs of infection (pain, swelling, redness, odor or green/yellow discharge around incision site)   Complete by:  As directed    Call MD for:  severe uncontrolled pain   Complete by:  As directed    Call MD for:  temperature >100.4   Complete by:  As directed    Diet - low sodium heart healthy   Complete by:  As directed    Home infusion instructions [facility] May follow [facility] Dosing Protocol; May administer Cathflo as needed to maintain patency of vascular access device.; Flushing of vascular access device: per [facility] Protocol: 0.9% NaCl pre/post medica...   Complete by:  As directed    Instructions:  May follow [place] Dosing Protocol   Instructions:  May administer Cathflo as needed to maintain patency of vascular access device.   Instructions:  Flushing of vascular access device: per [facility] Protocol: 0.9% NaCl pre/post medication administration and prn patency; Heparin 100 u/ml, 63m for implanted ports and Heparin 10u/ml, 527mfor all other central venous catheters.   Instructions:  May follow [facility] Anaphylaxis Protocol for First Dose Administration in the home: 0.9% NaCl at 25-50 ml/hr to maintain IV access for protocol meds. Epinephrine 0.3 ml IV/IM PRN and Benadryl 25-50 IV/IM PRN s/s of anaphylaxis.   Instructions:  [facility] Coordinator (RN) to assist per patient IV care needs in the home PRN.   Increase activity slowly   Complete by:  As directed      Allergies as of 05/05/2018      Reactions   Ace Inhibitors Cough   Florastor Kids [saccharomyces Boulardii] Other (See Comments)   PROBIOTICS in particular ones such as florastor can cause fungemia in case of former or bacterremia in patients esp ones such as Mrs AdVazguezho are immunosuppressed. Furthermore there is no compelling evidence that they reduce C difficile or abx associated diarrhea    Scopolamine Other (See Comments)   Dizzy, "lost control of my body", fell down and cracked a rib   Sulfa Antibiotics Hives      Medication List    TAKE these medications   acetaminophen 500 MG tablet Commonly known as:  TYLENOL Take 1,000 mg by mouth every 6 (six) hours as needed for pain.   albuterol 108 (90 Base) MCG/ACT inhaler Commonly known as:  PROVENTIL HFA;VENTOLIN HFA Inhale 2 puffs into the lungs every 6 (six) hours as needed for wheezing or shortness of breath.   ampicillin-sulbactam  IVPB Commonly known as:  UNASYN Inject 3 g into the vein every 8 (eight) hours for 35 doses. Indication:  Kleb pneumo Bactermia Last Day of Therapy:  8/24 Labs - Once weekly:  CBC/D and BMP, Labs - Every other week:  ESR and CRP   [employer] STOOL SOFTENER PO Take 2 capsules by mouth daily.   diphenhydrAMINE 25 MG tablet Commonly known as:  BENADRYL Take 25 mg by mouth daily as needed for sleep (itching from morphine).   flecainide 100 MG tablet Commonly known  as:  TAMBOCOR Take 1 tablet (100 mg total) by mouth 2 (two) times daily.   fluticasone 50 MCG/ACT nasal spray Commonly known as:  FLONASE Place 1 spray into both nostrils 2 (two) times daily as needed for allergies.   HYDROcodone-acetaminophen 5-325 MG tablet Commonly known as:  NORCO/VICODIN Take 1-2 tablets by mouth every 4 (four) hours as needed for moderate pain. What changed:    how much to take  when to take this  additional instructions   lidocaine-prilocaine cream Commonly known as:  EMLA Apply small amount over port area 1-2 hours prior to treatment and cover with plastic wrap.  DO NOT RUB IN. What changed:    how much to take  how to take this  when to take this   metoprolol succinate 25 MG 24 hr tablet Commonly known as:  TOPROL-XL TAKE 1/2 TABLET BY MOUTH DAILY What changed:    how much to take  how to take this  when to take this   morphine 15 MG tablet Commonly known as:  MSIR Take 1 tablet  (15 mg total) by mouth every 4 (four) hours as needed for severe pain. What changed:  Another medication with the same name was removed. Continue taking this medication, and follow the directions you see here.   MUCINEX 600 MG 12 hr tablet Generic drug:  guaiFENesin Take 600 mg by mouth daily.   polyethylene glycol packet Commonly known as:  MIRALAX / GLYCOLAX Take 17 g by mouth daily. Mix in 8 oz water and drink   promethazine 12.5 MG tablet Commonly known as:  PHENERGAN Take 1 tablet (12.5 mg total) by mouth every 6 (six) hours as needed for nausea or vomiting.   Rivaroxaban 15 MG Tabs tablet Commonly known as:  XARELTO Take 2 tablets (30 mg total) by mouth daily. What changed:    medication strength  how much to take   zolpidem 6.25 MG CR tablet Commonly known as:  AMBIEN CR Take 1 tablet (6.25 mg total) by mouth at bedtime.            Home Infusion Instuctions  (From admission, onward)         Start     Ordered   05/05/18 0000  Home infusion instructions [facility] May follow [place] Dosing Protocol; May administer Cathflo as needed to maintain patency of vascular access device.; Flushing of vascular access device: per [facility] Protocol: 0.9% NaCl pre/post medica...    Question Answer Comment  Instructions May follow [place] Dosing Protocol   Instructions May administer Cathflo as needed to maintain patency of vascular access device.   Instructions Flushing of vascular access device: per [facility] Protocol: 0.9% NaCl pre/post medication administration and prn patency; Heparin 100 u/ml, 74m for implanted ports and Heparin 10u/ml, 532mfor all other central venous catheters.   Instructions May follow [facility] Anaphylaxis Protocol for First Dose Administration in the home: 0.9% NaCl at 25-50 ml/hr to maintain IV access for protocol meds. Epinephrine 0.3 ml IV/IM PRN and Benadryl 25-50 IV/IM PRN s/s of anaphylaxis.   Instructions [facility] Infusion Coordinator  (RN) to assist per patient IV care needs in the home PRN.      05/05/18 1505         Follow-up Information    BaChesley NoonMD. Schedule an appointment as soon as possible for a visit in 1 week(s).   Specialty:  Family Medicine Why:  Hospital follow up Contact information: [phone]  [place] [address] [phone]          Allergies  Allergen Reactions  . Ace Inhibitors Cough  . Florastor Kids [Saccharomyces Boulardii] Other (See Comments)    PROBIOTICS in particular ones such as florastor can cause fungemia in case of former or bacterremia in patients esp ones such as Mrs Happ who are immunosuppressed. Furthermore there is no compelling evidence that they reduce C difficile or abx associated diarrhea  . Scopolamine Other (See Comments)    Dizzy, "lost control of my body", fell down and cracked a rib  . Sulfa Antibiotics Hives    Consultations:  ID    Procedures/Studies: Dg Chest 2 View  Result Date: 04/26/2018 CLINICAL DATA:  Chest pain, shortness of breath EXAM: CHEST - 2 VIEW COMPARISON:  PET-CT dated 01/16/2018 FINDINGS: Lungs are clear.  No pleural effusion or pneumothorax. The heart is normal in size. Left chest port terminating in the lower SVC. Left shoulder arthroplasty. IMPRESSION: No evidence of acute cardiopulmonary disease. Electronically Signed   By: Julian Hy M.D.   On: 04/26/2018 20:29   Ct Head W & Wo Contrast  Result Date: 04/29/2018 CLINICAL DATA:  Headaches for 7 days. History of pancreatic cancer and endometrial cancer. Restaging. EXAM: CT HEAD WITHOUT AND WITH CONTRAST TECHNIQUE: Contiguous axial images were obtained from the base of the skull through the vertex without and with intravenous contrast CONTRAST:  48m OMNIPAQUE IOHEXOL 300 MG/ML  SOLN COMPARISON:  PET scan 01/16/2018 FINDINGS: Brain: No evidence for acute infarction, hemorrhage, mass lesion, hydrocephalus, or extra-axial fluid. Normal for age cerebral volume.  Slight hypoattenuation of white matter, favored to represent post treatment effect/small vessel disease Post infusion, no abnormal enhancement of the brain or meninges. Vascular: Calcification of the cavernous internal carotid arteries consistent with cerebrovascular atherosclerotic disease. No signs of intracranial large vessel occlusion. Skull: Calvarium intact. Sinuses/Orbits: Negative sinuses.  No orbital abnormality. Other: None. IMPRESSION: No evidence of intracranial metastatic disease. No cause for the reported symptoms is identified. Electronically Signed   By: JStaci RighterM.D.   On: 04/29/2018 11:12   Ct Abdomen Pelvis W Contrast  Result Date: 04/26/2018 CLINICAL DATA:  Epigastric pain beginning today. History of pancreatic cancer with last chemotherapy on Wednesday. EXAM: CT ABDOMEN AND PELVIS WITH CONTRAST TECHNIQUE: Multidetector CT imaging of the abdomen and pelvis was performed using the standard protocol following bolus administration of intravenous contrast. CONTRAST:  1084mOMNIPAQUE IOHEXOL 300 MG/ML  SOLN COMPARISON:  None. FINDINGS: Lower chest: Top-normal size heart. No pericardial effusion. Clear lung bases. Hepatobiliary: Stable intrahepatic ductal dilatation with slight interval decrease in dilatation of the common bile duct now approximately 1.4 cm versus 1.7 cm previously. No abnormal enhancing mass lesions of the liver to suggest development of metastasis. Pancreas: Redemonstration of postop change from Whipple procedure with pancreatic body and tail atrophy as before. Interval decrease in ductal dilatation of the pancreatic duct estimated 4 mm versus 5 mm previously. Spleen: No splenomegaly or mass. Adrenals/Urinary Tract: Normal bilateral adrenal glands. Normal left kidney. Slight decrease in high-grade obstruction of the right proximal renal collecting system secondary to partial encasement of the right renal pelvis by soft tissue mass. Resulting asymmetric nephrograms with  relative decrease in enhancement of the right renal cortex relative to left as before. The urinary bladder is physiologically distended without focal mural thickening or calculi. Stomach/Bowel: Redemonstration of duodenum a jejunostomy stent with slight decrease in soft tissue encasing mass surrounding the stent measuring up to  3.4 cm in thickness versus 4.1 cm previously. Tumor encasing the duodenum has slightly decreased in thickness as well from 15 mm to 10 mm single wall thickness. There are scattered fluid-filled small bowel loops noted without mechanical bowel obstruction. Findings are suggestive of a mild small bowel enteritis potentially. Increased stool burden and retention is noted within the colon. Vascular/Lymphatic: Mild atherosclerosis of the abdominal aorta without aneurysm. Partial case mint of the celiac trunk and proximal SMA as before without occlusion. 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 Collateralized vessels within the mesentery are noted as a result, better visualized due to phase of enhancement on current study though present previously. Redemonstration of retroperitoneal tracking of tumor to the right common iliac nodal chain relatively stable in appearance. No pelvic or inguinal lymphadenopathy. Reproductive: Status post hysterectomy. No adnexal masses. Other: No abdominal wall hernia or abnormality. No abdominopelvic ascites. Musculoskeletal: No acute nor aggressive osseous findings. IMPRESSION: 1. Slight interval improvement in the appearance of the abdomen and pelvis. Slight decrease in right-sided retroperitoneal soft tissue mass encasing the IVC, celiac trunk, SMA, duodenum and right renal pelvis with slight interval decrease in extrahepatic and pancreatic ductal dilatation. 2. Slight interval decrease and marked hydroureteronephrosis. 3. Slight decrease in single wall thickness of the duodenum at the level of the  stent. 4. Mild fluid-filled small bowel loops may represent small bowel enteritis. No mechanical bowel obstruction is seen. Electronically Signed   By: Ashley Royalty M.D.   On: 04/26/2018 22:08   [place] Ekg Site Rite  Result Date: 05/04/2018 If Site Rite image not attached, placement could not be confirmed due to current cardiac rhythm.   Discharge Exam: Vitals:   05/04/18 2101 05/05/18 0539  BP: 135/82 (!) 151/76  Pulse: 63 61  Resp: 17 16  Temp: 98 F (36.7 C) 97.7 F (36.5 C)  SpO2: 100% 100%   Vitals:   05/03/18 2154 05/04/18 0642 05/04/18 2101 05/05/18 0539  BP: 132/71 (!) 148/82 135/82 (!) 151/76  Pulse: 66 64 63 61  Resp: '20 20 17 16  ' Temp: 98.8 F (37.1 C) 98.5 F (36.9 C) 98 F (36.7 C) 97.7 F (36.5 C)  TempSrc: Oral Oral Oral Oral  SpO2: 100% 99% 100% 100%  Weight:      Height:        General: Pt is alert, awake, not in acute distress Cardiovascular: RRR, S1/S2 +, no rubs, no gallops Respiratory: CTA bilaterally, no wheezing, no rhonchi Abdominal: Soft, NT, ND, bowel sounds + Extremities: no edema, no cyanosis   The results of significant diagnostics from this hospitalization (including imaging, microbiology, ancillary and laboratory) are listed below for reference.     Microbiology: Recent Results (from the past 240 hour(s))  Culture, Blood     Status: Abnormal   Collection Time: 04/28/18  4:07 PM  Result Value Ref Range Status   Specimen Description   Final    PORTA CATH Performed at [facility], [address], [place], [place] [address]    Special Requests   Final    BOTTLES DRAWN AEROBIC AND ANAEROBIC Blood Culture adequate volume   Culture  Setup Time   Final    GRAM NEGATIVE RODS AEROBIC BOTTLE ONLY CRITICAL RESULT CALLED TO, READ BACK BY AND VERIFIED WITHChristean Grief [facility] 7867 05/01/18 A BROWNING Performed at [facility], [place] [address] [place], [place] [address]    Culture KLEBSIELLA PNEUMONIAE (A)  Final    Report Status 05/04/2018 FINAL  Final   Organism ID, Bacteria KLEBSIELLA PNEUMONIAE  Final      Susceptibility   Klebsiella pneumoniae - MIC*    AMPICILLIN >=32 RESISTANT Resistant     CEFAZOLIN <=4 SENSITIVE Sensitive     CEFEPIME <=1 SENSITIVE Sensitive     CEFTAZIDIME <=1 SENSITIVE Sensitive     CEFTRIAXONE <=1 SENSITIVE Sensitive     CIPROFLOXACIN <=0.25 SENSITIVE Sensitive     GENTAMICIN <=1 SENSITIVE Sensitive     IMIPENEM <=0.25 SENSITIVE Sensitive     TRIMETH/SULFA <=20 SENSITIVE Sensitive     AMPICILLIN/SULBACTAM 4 SENSITIVE Sensitive     PIP/TAZO <=4 SENSITIVE Sensitive     Extended ESBL NEGATIVE Sensitive     * KLEBSIELLA PNEUMONIAE  Blood Culture ID Panel (Reflexed)     Status: Abnormal   Collection Time: 04/28/18  4:07 PM  Result Value Ref Range Status   Enterococcus species NOT DETECTED NOT DETECTED Final   Listeria monocytogenes NOT DETECTED NOT DETECTED Final   Staphylococcus species NOT DETECTED NOT DETECTED Final   Staphylococcus aureus NOT DETECTED NOT DETECTED Final   Streptococcus species NOT DETECTED NOT DETECTED Final   Streptococcus agalactiae NOT DETECTED NOT DETECTED Final   Streptococcus pneumoniae NOT DETECTED NOT DETECTED Final   Streptococcus pyogenes NOT DETECTED NOT DETECTED Final   Acinetobacter baumannii NOT DETECTED NOT DETECTED Final   Enterobacteriaceae species DETECTED (A) NOT DETECTED Final    Comment: Enterobacteriaceae represent a large family of gram-negative bacteria, not a single organism. CRITICAL RESULT CALLED TO, READ BACK BY AND VERIFIED WITH: Christean Grief PHARMD [id number] 05/01/18 A BROWNING    Enterobacter cloacae complex NOT DETECTED NOT DETECTED Final   Escherichia coli NOT DETECTED NOT DETECTED Final   Klebsiella oxytoca NOT DETECTED NOT DETECTED Final   Klebsiella pneumoniae DETECTED (A) NOT DETECTED Final    Comment: CRITICAL RESULT CALLED TO, READ BACK BY AND VERIFIED WITH: Christean Grief PHARMD [id number] 05/01/18 A BROWNING    Proteus species  NOT DETECTED NOT DETECTED Final   Serratia marcescens NOT DETECTED NOT DETECTED Final   Carbapenem resistance NOT DETECTED NOT DETECTED Final   Haemophilus influenzae NOT DETECTED NOT DETECTED Final   Neisseria meningitidis NOT DETECTED NOT DETECTED Final   Pseudomonas aeruginosa NOT DETECTED NOT DETECTED Final   Candida albicans NOT DETECTED NOT DETECTED Final   Candida glabrata NOT DETECTED NOT DETECTED Final   Candida krusei NOT DETECTED NOT DETECTED Final   Candida parapsilosis NOT DETECTED NOT DETECTED Final   Candida tropicalis NOT DETECTED NOT DETECTED Final    Comment: Performed at [facility], [place] [address] [place], [place] [address]  Surgical pcr screen     Status: None   Collection Time: 05/02/18  2:17 AM  Result Value Ref Range Status   MRSA, PCR NEGATIVE NEGATIVE Final   Staphylococcus aureus NEGATIVE NEGATIVE Final    Comment: (NOTE) The Xpert SA Assay (FDA approved for NASAL specimens in patients 73 years of age and older), is one component of a comprehensive surveillance program. It is not intended to diagnose infection nor to guide or monitor treatment. Performed at [facility], [place] [address] [place], [place] [address]   Anaerobic culture     Status: None (Preliminary result)   Collection Time: 05/02/18  8:44 AM  Result Value Ref Range Status   Specimen Description   Final    PORTA CATH Performed at [facility]  [address] [place], [place] [address]    Special Requests PENDING  Incomplete   Culture   Final    NO ANAEROBES ISOLATED; CULTURE IN PROGRESS FOR 5 DAYS   Report Status PENDING  Incomplete  Aerobic Culture (superficial specimen)     Status: None (Preliminary result)   Collection Time: 05/02/18  8:44 AM  Result Value Ref Range Status   Specimen Description   Final    TISSUE PORTA CATH Performed at [facility], [place] [address] [place], [place] [address]    Special Requests   Final     NONE Performed at [facility], [place] [address] [place], [place] [address]    Gram Stain   Final    RARE WBC PRESENT,BOTH PMN AND MONONUCLEAR NO ORGANISMS SEEN    Culture   Final    NO GROWTH 2 DAYS Performed at [facility], [place] [address] [place], [place] [address]    Report Status PENDING  Incomplete     Labs: BNP (last 3 results) No results for input(s): BNP in the last 8760 hours. Basic Metabolic Panel: Recent Labs  Lab 04/29/18 0500 04/29/18 2030 05/02/18 1543  NA 136 137 139  K 3.7 3.5 3.7  CL 105 105 106  CO2 '23 22 26  ' GLUCOSE 96 133* 153*  BUN '18 15 12  ' CREATININE 0.99 1.19* 1.12*  CALCIUM 8.4* 8.5* 8.2*  MG 1.9  --   --   PHOS 3.6  --   --    Liver Function Tests: Recent Labs  Lab 04/29/18 0500 04/29/18 2030 05/02/18 1543  AST 244* 156* 45*  ALT 156* 126* 58*  ALKPHOS 702* 657* 435*  BILITOT 0.7 0.6 0.4  PROT 6.0* 6.0* 5.3*  ALBUMIN 3.0* 3.1* 2.5*   No results for input(s): LIPASE, AMYLASE in the last 168 hours. No results for input(s): AMMONIA in the last 168 hours. CBC: Recent Labs  Lab 04/29/18 0500 04/30/18 0806 05/02/18 0542 05/03/18 0502  WBC 24.4* 26.5* 15.5* 17.2*  HGB 8.1* 8.9* 8.3* 8.8*  HCT 23.8* 25.8* 24.6* 26.1*  MCV 86.5 85.4 86.9 87.6  PLT 273 287 180 184   Cardiac Enzymes: No results for input(s): CKTOTAL, CKMB, CKMBINDEX, TROPONINI in the last 168 hours. BNP: Invalid input(s): POCBNP CBG: No results for input(s): GLUCAP in the last 168 hours. D-Dimer No results for input(s): DDIMER in the last 72 hours. Hgb A1c No results for input(s): HGBA1C in the last 72 hours. Lipid Profile No results for input(s): CHOL, HDL, LDLCALC, TRIG, CHOLHDL, LDLDIRECT in the last 72 hours. Thyroid function studies No results for input(s): TSH, T4TOTAL, T3FREE, THYROIDAB in the last 72 hours.  Invalid input(s): FREET3 Anemia work up No results for input(s): VITAMINB12, FOLATE, FERRITIN, TIBC, IRON, RETICCTPCT  in the last 72 hours. Urinalysis    Component Value Date/Time   COLORURINE STRAW (A) 04/26/2018 1939   APPEARANCEUR CLEAR 04/26/2018 1939   LABSPEC 1.004 (L) 04/26/2018 1939   LABSPEC 1.005 04/21/2014 1713   PHURINE 5.0 04/26/2018 1939   GLUCOSEU NEGATIVE 04/26/2018 1939   GLUCOSEU Negative 04/21/2014 1713   HGBUR NEGATIVE 04/26/2018 1939   BILIRUBINUR NEGATIVE 04/26/2018 1939   BILIRUBINUR Negative 04/21/2014 1713   KETONESUR NEGATIVE 04/26/2018 1939   PROTEINUR NEGATIVE 04/26/2018 1939   UROBILINOGEN 0.2 10/16/2014 1035   UROBILINOGEN 0.2 04/21/2014 1713   NITRITE NEGATIVE 04/26/2018 1939   LEUKOCYTESUR NEGATIVE 04/26/2018 1939   LEUKOCYTESUR Trace 04/21/2014 1713   Sepsis Labs Invalid input(s):  PROCALCITONIN,  WBC,  LACTICIDVEN Microbiology Recent Results (from the past 240 hour(s))  Culture, Blood     Status: Abnormal   Collection Time: 04/28/18  4:07 PM  Result Value Ref Range Status   Specimen Description   Final    PORTA CATH Performed at [facility], [address] [place], [place] [address]    Special Requests   Final    BOTTLES DRAWN AEROBIC AND ANAEROBIC Blood Culture adequate volume   Culture  Setup Time   Final    GRAM NEGATIVE RODS AEROBIC BOTTLE ONLY CRITICAL RESULT CALLED TO, READ BACK BY AND VERIFIED WITHChristean Grief [facility] [id number] 05/01/18 A BROWNING Performed at [facility], [address] [place], [place] [address]    Culture KLEBSIELLA PNEUMONIAE (A)  Final   Report Status 05/04/2018 FINAL  Final   Organism ID, Bacteria KLEBSIELLA PNEUMONIAE  Final      Susceptibility   Klebsiella pneumoniae - MIC*    AMPICILLIN >=32 RESISTANT Resistant     CEFAZOLIN <=4 SENSITIVE Sensitive     CEFEPIME <=1 SENSITIVE Sensitive     CEFTAZIDIME <=1 SENSITIVE Sensitive     CEFTRIAXONE <=1 SENSITIVE Sensitive     CIPROFLOXACIN <=0.25 SENSITIVE Sensitive     GENTAMICIN <=1 SENSITIVE Sensitive     IMIPENEM <=0.25 SENSITIVE Sensitive      TRIMETH/SULFA <=20 SENSITIVE Sensitive     AMPICILLIN/SULBACTAM 4 SENSITIVE Sensitive     PIP/TAZO <=4 SENSITIVE Sensitive     Extended ESBL NEGATIVE Sensitive     * KLEBSIELLA PNEUMONIAE  Blood Culture ID Panel (Reflexed)     Status: Abnormal   Collection Time: 04/28/18  4:07 PM  Result Value Ref Range Status   Enterococcus species NOT DETECTED NOT DETECTED Final   Listeria monocytogenes NOT DETECTED NOT DETECTED Final   Staphylococcus species NOT DETECTED NOT DETECTED Final   Staphylococcus aureus NOT DETECTED NOT DETECTED Final   Streptococcus species NOT DETECTED NOT DETECTED Final   Streptococcus agalactiae NOT DETECTED NOT DETECTED Final   Streptococcus pneumoniae NOT DETECTED NOT DETECTED Final   Streptococcus pyogenes NOT DETECTED NOT DETECTED Final   Acinetobacter baumannii NOT DETECTED NOT DETECTED Final   Enterobacteriaceae species DETECTED (A) NOT DETECTED Final    Comment: Enterobacteriaceae represent a large family of gram-negative bacteria, not a single organism. CRITICAL RESULT CALLED TO, READ BACK BY AND VERIFIED WITH: Christean Grief [facility] [id number] 05/01/18 A BROWNING    Enterobacter cloacae complex NOT DETECTED NOT DETECTED Final   Escherichia coli NOT DETECTED NOT DETECTED Final   Klebsiella oxytoca NOT DETECTED NOT DETECTED Final   Klebsiella pneumoniae DETECTED (A) NOT DETECTED Final    Comment: CRITICAL RESULT CALLED TO, READ BACK BY AND VERIFIED WITH: Christean Grief [facility] [id number] 05/01/18 A BROWNING    Proteus species NOT DETECTED NOT DETECTED Final   Serratia marcescens NOT DETECTED NOT DETECTED Final   Carbapenem resistance NOT DETECTED NOT DETECTED Final   Haemophilus influenzae NOT DETECTED NOT DETECTED Final   Neisseria meningitidis NOT DETECTED NOT DETECTED Final   Pseudomonas aeruginosa NOT DETECTED NOT DETECTED Final   Candida albicans NOT DETECTED NOT DETECTED Final   Candida glabrata NOT DETECTED NOT DETECTED Final   Candida krusei NOT DETECTED NOT DETECTED Final    Candida parapsilosis NOT DETECTED NOT DETECTED Final   Candida tropicalis NOT DETECTED NOT DETECTED Final    Comment: Performed at [facility], [place] [address], [place], [place] [address]  Surgical pcr  screen     Status: None   Collection Time: 05/02/18  2:17 AM  Result Value Ref Range Status   MRSA, PCR NEGATIVE NEGATIVE Final   Staphylococcus aureus NEGATIVE NEGATIVE Final    Comment: (NOTE) The Xpert SA Assay (FDA approved for NASAL specimens in patients 67 years of age and older), is one component of a comprehensive surveillance program. It is not intended to diagnose infection nor to guide or monitor treatment. Performed at [facility], [place] [address] [place], [place] [address]   Anaerobic culture     Status: None (Preliminary result)   Collection Time: 05/02/18  8:44 AM  Result Value Ref Range Status   Specimen Description   Final    PORTA CATH Performed at [place] [address], [place], [place] [address]    Special Requests PENDING  Incomplete   Culture   Final    NO ANAEROBES ISOLATED; CULTURE IN PROGRESS FOR 5 DAYS   Report Status PENDING  Incomplete  Aerobic Culture (superficial specimen)     Status: None (Preliminary result)   Collection Time: 05/02/18  8:44 AM  Result Value Ref Range Status   Specimen Description   Final    TISSUE PORTA CATH Performed at [facility], [place] [address], [place], [place] [address]    Special Requests   Final    NONE Performed at [facility], [place] [address] [place], [place] [address]    Gram Stain   Final    RARE WBC PRESENT,BOTH PMN AND MONONUCLEAR NO ORGANISMS SEEN    Culture   Final    NO GROWTH 2 DAYS Performed at [facility], [place] [address], [place], [place] [address]    Report Status PENDING  Incomplete    Time coordinating discharge: 32 minutes  SIGNED:  Chipper Oman, MD  [facility] 05/05/2018, 3:18 PM  Pager please  text page via  [email or web address]  Note - This record has been created using [employer]. Chart creation errors have been sought, but may not always have been located. Such creation errors do not reflect on the standard of medical care.

## 2018-05-06 ENCOUNTER — Telehealth: Payer: Self-pay | Admitting: Oncology

## 2018-05-06 ENCOUNTER — Inpatient Hospital Stay: Payer: Medicare Other

## 2018-05-06 LAB — AEROBIC CULTURE W GRAM STAIN (SUPERFICIAL SPECIMEN)

## 2018-05-06 LAB — AEROBIC CULTURE  (SUPERFICIAL SPECIMEN): CULTURE: NO GROWTH

## 2018-05-06 NOTE — Telephone Encounter (Signed)
Scheduled appt per 8/12 sch message - pt is aware of appt date and time.

## 2018-05-07 ENCOUNTER — Ambulatory Visit: Payer: Medicare Other | Admitting: Nurse Practitioner

## 2018-05-07 ENCOUNTER — Other Ambulatory Visit: Payer: Medicare Other

## 2018-05-07 ENCOUNTER — Ambulatory Visit: Payer: Medicare Other

## 2018-05-07 ENCOUNTER — Telehealth: Payer: Self-pay | Admitting: Cardiology

## 2018-05-07 NOTE — Telephone Encounter (Signed)
New Message    Pt c/o medication issue:  1. Name of Medication: Xarelto  2. How are you currently taking this medication (dosage and times per day)?   3. Are you having a reaction (difficulty breathing--STAT)?   4. What is your medication issue?Arbie Cookey with [facility] is calling on behalf of the patient. She states that the patient was recommended to increase her Xarelto. But she did not. She wants to be sure that it was okay that she did not do the increase due to bleeding around her pick line. Please reach out to the patient to advise.

## 2018-05-07 NOTE — Telephone Encounter (Signed)
Pt has been on Xarelto 20 mg daily in the past for At Fib.  When she was admitting recently this was the dose she was one.  During her hospitalization the Xarelto was put on hold d/t nose bleeds and in preparation for removal of port and placement of PICC line.  On 8/12 Xarelto was restarted at 15 mg daily then on her d/c paperwork she was instructed to take 2 tablets daily for a total of 30 mg a day.  Her PICC line has still been bleeding today though the home health nurse did get it stopped earlier today.  Advised pt to not take 30 mg of Xarelto at this point and I will review with Dr Marlou Porch and call back with further instructions.  Of note- I have not been able to locate any documentation from her recent hospitalization as to why her Xarelto dose would have been increased to 30 mg daily.  Pt is unaware of why she was instructed to take 30 mg daily as well.

## 2018-05-08 LAB — ANAEROBIC CULTURE

## 2018-05-08 NOTE — Telephone Encounter (Signed)
Line busy X 3 - will continue to attempt to contact pt.

## 2018-05-08 NOTE — Telephone Encounter (Signed)
Please change her back to Xarelto 20mg  PO QD. Reviewed chart. No new DVT or PE.  Thanks Candee Furbish, MD

## 2018-05-08 NOTE — Telephone Encounter (Signed)
Pt aware to take Xarelto 20 mg once a day.  She reports she does not need a rx as she just had it refilled.  She was over due for f/u with Dr Marlou Porch and I scheduled her for 8/26 to see him.  She will c/b to reschedule if necessary as she is going through chemo again for pancreatic cancer the 2nd time.

## 2018-05-12 ENCOUNTER — Inpatient Hospital Stay: Payer: Medicare Other

## 2018-05-12 ENCOUNTER — Telehealth: Payer: Self-pay | Admitting: Oncology

## 2018-05-12 ENCOUNTER — Inpatient Hospital Stay: Payer: Medicare Other | Admitting: Oncology

## 2018-05-12 ENCOUNTER — Other Ambulatory Visit: Payer: Self-pay

## 2018-05-12 VITALS — BP 137/87 | HR 56 | Temp 98.2°F | Resp 18 | Ht 62.0 in | Wt 133.5 lb

## 2018-05-12 VITALS — BP 156/85 | HR 54 | Temp 97.7°F | Resp 16

## 2018-05-12 DIAGNOSIS — R7989 Other specified abnormal findings of blood chemistry: Secondary | ICD-10-CM

## 2018-05-12 DIAGNOSIS — Z8542 Personal history of malignant neoplasm of other parts of uterus: Secondary | ICD-10-CM | POA: Diagnosis not present

## 2018-05-12 DIAGNOSIS — D72823 Leukemoid reaction: Secondary | ICD-10-CM | POA: Diagnosis not present

## 2018-05-12 DIAGNOSIS — R112 Nausea with vomiting, unspecified: Secondary | ICD-10-CM | POA: Diagnosis not present

## 2018-05-12 DIAGNOSIS — A4159 Other Gram-negative sepsis: Secondary | ICD-10-CM

## 2018-05-12 DIAGNOSIS — C259 Malignant neoplasm of pancreas, unspecified: Secondary | ICD-10-CM

## 2018-05-12 DIAGNOSIS — I4891 Unspecified atrial fibrillation: Secondary | ICD-10-CM

## 2018-05-12 DIAGNOSIS — Z7901 Long term (current) use of anticoagulants: Secondary | ICD-10-CM | POA: Diagnosis not present

## 2018-05-12 DIAGNOSIS — C250 Malignant neoplasm of head of pancreas: Secondary | ICD-10-CM | POA: Diagnosis present

## 2018-05-12 DIAGNOSIS — D701 Agranulocytosis secondary to cancer chemotherapy: Secondary | ICD-10-CM

## 2018-05-12 DIAGNOSIS — Z5111 Encounter for antineoplastic chemotherapy: Secondary | ICD-10-CM | POA: Diagnosis present

## 2018-05-12 DIAGNOSIS — Z5189 Encounter for other specified aftercare: Secondary | ICD-10-CM | POA: Diagnosis not present

## 2018-05-12 LAB — CBC WITH DIFFERENTIAL (CANCER CENTER ONLY)
BASOS ABS: 0 10*3/uL (ref 0.0–0.1)
BASOS PCT: 1 %
EOS ABS: 0.4 10*3/uL (ref 0.0–0.5)
Eosinophils Relative: 6 %
HCT: 21.6 % — ABNORMAL LOW (ref 34.8–46.6)
Hemoglobin: 7.2 g/dL — ABNORMAL LOW (ref 11.6–15.9)
Lymphocytes Relative: 11 %
Lymphs Abs: 0.7 10*3/uL — ABNORMAL LOW (ref 0.9–3.3)
MCH: 30.4 pg (ref 25.1–34.0)
MCHC: 33.4 g/dL (ref 31.5–36.0)
MCV: 91 fL (ref 79.5–101.0)
MONO ABS: 0.5 10*3/uL (ref 0.1–0.9)
Monocytes Relative: 9 %
Neutro Abs: 4.5 10*3/uL (ref 1.5–6.5)
Neutrophils Relative %: 73 %
Platelet Count: 262 10*3/uL (ref 145–400)
RBC: 2.37 MIL/uL — ABNORMAL LOW (ref 3.70–5.45)
RDW: 20.3 % — AB (ref 11.2–14.5)
WBC: 6.1 10*3/uL (ref 3.9–10.3)

## 2018-05-12 LAB — CMP (CANCER CENTER ONLY)
ALBUMIN: 2.8 g/dL — AB (ref 3.5–5.0)
ALK PHOS: 914 U/L — AB (ref 38–126)
ALT: 65 U/L — ABNORMAL HIGH (ref 0–44)
AST: 92 U/L — AB (ref 15–41)
Anion gap: 6 (ref 5–15)
BILIRUBIN TOTAL: 0.6 mg/dL (ref 0.3–1.2)
BUN: 13 mg/dL (ref 8–23)
CALCIUM: 8.2 mg/dL — AB (ref 8.9–10.3)
CO2: 26 mmol/L (ref 22–32)
Chloride: 104 mmol/L (ref 98–111)
Creatinine: 0.89 mg/dL (ref 0.44–1.00)
GFR, Est AFR Am: >60 mL/min (ref >60)
GLUCOSE: 108 mg/dL — AB (ref 70–99)
Potassium: 3.9 mmol/L (ref 3.5–5.1)
Sodium: 136 mmol/L (ref 135–145)
TOTAL PROTEIN: 6.2 g/dL — AB (ref 6.5–8.1)

## 2018-05-12 LAB — PREPARE RBC (CROSSMATCH)

## 2018-05-12 LAB — ABO/RH: ABO/RH(D): O POS

## 2018-05-12 MED ORDER — HEPARIN SOD (PORK) LOCK FLUSH 100 UNIT/ML IV SOLN
250.0000 [IU] | Freq: Once | INTRAVENOUS | Status: AC | PRN
  Administered 2018-05-12: 250 [IU]
  Filled 2018-05-12: qty 5

## 2018-05-12 MED ORDER — HYDROCODONE-ACETAMINOPHEN 5-325 MG PO TABS
2.0000 | ORAL_TABLET | Freq: Once | ORAL | Status: AC
  Administered 2018-05-12: 2 via ORAL

## 2018-05-12 MED ORDER — PROCHLORPERAZINE MALEATE 10 MG PO TABS
10.0000 mg | ORAL_TABLET | Freq: Once | ORAL | Status: AC
  Administered 2018-05-12: 10 mg via ORAL

## 2018-05-12 MED ORDER — HYDROCODONE-ACETAMINOPHEN 5-325 MG PO TABS: 1.0000 | ORAL_TABLET | ORAL | 0 refills | Status: DC | PRN

## 2018-05-12 MED ORDER — SODIUM CHLORIDE 0.9% FLUSH
3.0000 mL | INTRAVENOUS | Status: DC | PRN
  Administered 2018-05-12: 3 mL
  Filled 2018-05-12: qty 10

## 2018-05-12 MED ORDER — SODIUM CHLORIDE 0.9 % IV SOLN
Freq: Once | INTRAVENOUS | Status: AC
  Administered 2018-05-12: 10:00:00 via INTRAVENOUS
  Filled 2018-05-12: qty 250

## 2018-05-12 MED ORDER — HYDROCODONE-ACETAMINOPHEN 5-325 MG PO TABS
ORAL_TABLET | ORAL | Status: AC
  Filled 2018-05-12: qty 2

## 2018-05-12 MED ORDER — PROCHLORPERAZINE MALEATE 10 MG PO TABS
ORAL_TABLET | ORAL | Status: AC
  Filled 2018-05-12: qty 1

## 2018-05-12 MED ORDER — PACLITAXEL PROTEIN-BOUND CHEMO INJECTION 100 MG
100.0000 mg/m2 | Freq: Once | Status: AC
  Administered 2018-05-12: 150 mg via INTRAVENOUS
  Filled 2018-05-12: qty 30

## 2018-05-12 MED ORDER — SODIUM CHLORIDE 0.9 % IV SOLN
800.0000 mg/m2 | Freq: Once | INTRAVENOUS | Status: AC
  Administered 2018-05-12: 1292 mg via INTRAVENOUS
  Filled 2018-05-12: qty 33.98

## 2018-05-12 NOTE — Progress Notes (Signed)
Ok to treat with today's lab values oer Dr. [name].

## 2018-05-12 NOTE — Patient Instructions (Signed)
[facility] Discharge Instructions for Patients Receiving Chemotherapy  Today you received the following chemotherapy agents:  Gemzar, Abraxane  To help prevent nausea and vomiting after your treatment, we encourage you to take your nausea medication as prescribed.   If you develop nausea and vomiting that is not controlled by your nausea medication, call the clinic.   BELOW ARE SYMPTOMS THAT SHOULD BE REPORTED IMMEDIATELY:  *FEVER GREATER THAN 100.5 F  *CHILLS WITH OR WITHOUT FEVER  NAUSEA AND VOMITING THAT IS NOT CONTROLLED WITH YOUR NAUSEA MEDICATION  *UNUSUAL SHORTNESS OF BREATH  *UNUSUAL BRUISING OR BLEEDING  TENDERNESS IN MOUTH AND THROAT WITH OR WITHOUT PRESENCE OF ULCERS  *URINARY PROBLEMS  *BOWEL PROBLEMS  UNUSUAL RASH Items with * indicate a potential emergency and should be followed up as soon as possible.  Feel free to call the clinic should you have any questions or concerns. The clinic phone number is [phone].  Please show the [facility] at check-in to the Emergency Department and triage nurse.   Blood Transfusion, Care After This sheet gives you information about how to care for yourself after your procedure. Your doctor may also give you more specific instructions. If you have problems or questions, contact your doctor. Follow these instructions at home:  Take over-the-counter and prescription medicines only as told by your doctor.  Go back to your normal activities as told by your doctor.  Follow instructions from your doctor about how to take care of the area where an IV tube was put into your vein (insertion site). Make sure you: ? Wash your hands with soap and water before you change your bandage (dressing). If there is no soap and water, use hand sanitizer. ? Change your bandage as told by your doctor.  Check your IV insertion site every day for signs of infection. Check for: ? More redness, swelling, or pain. ? More  fluid or blood. ? Warmth. ? Pus or a bad smell. Contact a doctor if:  You have more redness, swelling, or pain around the IV insertion site..  You have more fluid or blood coming from the IV insertion site.  Your IV insertion site feels warm to the touch.  You have pus or a bad smell coming from the IV insertion site.  Your pee (urine) turns pink, red, or brown.  You feel weak after doing your normal activities. Get help right away if:  You have signs of a serious allergic or body defense (immune) system reaction, including: ? Itchiness. ? Hives. ? Trouble breathing. ? Anxiety. ? Pain in your chest or lower back. ? Fever, flushing, and chills. ? Fast pulse. ? Rash. ? Watery poop (diarrhea). ? Throwing up (vomiting). ? Dark pee. ? Serious headache. ? Dizziness. ? Stiff neck. ? Yellow color in your face or the white parts of your eyes (jaundice). Summary  After a blood transfusion, return to your normal activities as told by your doctor.  Every day, check for signs of infection where the IV tube was put into your vein.  Some signs of infection are warm skin, more redness and pain, more fluid or blood, and pus or a bad smell where the needle went in.  Contact your doctor if you feel weak or have any unusual symptoms. This information is not intended to replace advice given to you by your health care provider. Make sure you discuss any questions you have with your health care provider. Document Released: 09/30/2014 Document Revised: 05/03/2016  Reviewed: 05/03/2016 Elsevier Interactive Patient Education  2017 Elsevier Inc.  

## 2018-05-12 NOTE — Telephone Encounter (Signed)
Patient scheduled per 8/20 sch message.  °

## 2018-05-12 NOTE — Telephone Encounter (Signed)
Added appointments for 9/3 and 9/4 per 8/20 los. Left message for patient. Other appointments remain the same and patient to get updated schedule tomorrow.

## 2018-05-12 NOTE — Progress Notes (Signed)
[facility] OFFICE PROGRESS NOTE   Diagnosis: Pancreas cancer  INTERVAL HISTORY:   [name] was last treated with gemcitabine/Abraxane on 04/22/2018.  She received Neulasta on 04/23/2018.  She was admitted on 04/28/2018 with nausea/vomiting and abdominal pain.  The liver enzymes were more elevated.  She was diagnosed with Klebsiella sepsis. She was discharged 05/05/2018.  She continues outpatient Zosyn.  She reports persistent abdominal pain, relieved with hydrocodone.  She reports feeling "weak" when she went outside over the past few days.  She is eating.  She reports bleeding at the PEG site when it was first placed and recent nosebleeding.  No other bleeding. The Port-A-Cath was removed 05/02/2018 and cultures returned negative.  Objective:  Vital signs in last 24 hours:  Blood pressure 137/87, pulse (!) 56, temperature 98.2 F (36.8 C), temperature source Oral, resp. rate 18, height 5\' 2"  (1.575 m), weight 133 lb 8 oz (60.6 kg), SpO2 100 %.    HEENT: No thrush or ulcers Resp: Lungs clear bilaterally Cardio: Regular rate and rhythm GI: Smooth masslike fullness in the right upper abdomen, diffuse tenderness in the upper abdomen Vascular: No leg edema    Portacath/PICC-without erythema  Lab Results:  Lab Results  Component Value Date   WBC 6.1 05/12/2018   HGB 7.2 (L) 05/12/2018   HCT 21.6 (L) 05/12/2018   MCV 91.0 05/12/2018   PLT 262 05/12/2018   NEUTROABS 4.5 05/12/2018    CMP  Lab Results  Component Value Date   NA 136 05/12/2018   K 3.9 05/12/2018   CL 104 05/12/2018   CO2 26 05/12/2018   GLUCOSE 108 (H) 05/12/2018   BUN 13 05/12/2018   CREATININE 0.89 05/12/2018   CALCIUM 8.2 (L) 05/12/2018   PROT 6.2 (L) 05/12/2018   ALBUMIN 2.8 (L) 05/12/2018   AST 92 (H) 05/12/2018   ALT 65 (H) 05/12/2018   ALKPHOS 914 (H) 05/12/2018   BILITOT 0.6 05/12/2018   GFRNONAA >60 05/12/2018   GFRAA >60 05/12/2018     Medications: I have reviewed the  patient's current medications.   Assessment/Plan: 1. 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  Gemcitabine held 11/02/2014 due to neutropenia.  Gemcitabine 11/09/2014 dose reduced 800 mg/m.  Gemcitabine held 11/16/2014 due to neutropenia.  Gemcitabine resumed 11/23/2014 every 2 week schedule.  Cycle 6 gemcitabine 01/05/2015  Cycle 7 gemcitabine 01/18/2015  Cycle 8 gemcitabine 02/01/2015  Cycle 9 gemcitabine 02/15/2015  Cycle 10 gemcitabine 03/01/2015  Cycle 11 gemcitabine 03/15/2015  Cycle 12 gemcitabine 03/29/2015  Elevated CA 19-01 May 2016  CTs 06/17/2016-new soft tissue mass at the root of the mesentery with vascular involvement  PET 06/27/2016-hypermetabolic activity associated with soft tissue adjacent to surgical clips in the central mesentery  Status post SBRTto the mesenteric mass completed 07/26/2016  CT abdomen/pelvis 08/23/2016 -mesenteric mass stable to slightly decreased in size.  Cycle 1 FOLFOX 09/03/2016  Cycle 2 FOLFOX 09/30/2016  Cycle 3 FOLFOX 10/14/2016   Cycle 4 FOLFOX 11/05/2016 (5-FU bolus eliminated and 5-FU pump dose reduced)  Cycle 5 FOLFOX 11/19/2016  CT abdomen/pelvis 11/28/2016-decreased size of soft tissue at the small bowel mesentery, mild asymmetric soft tissue at the left vaginal cuff  Cycle 6 FOLFOX 12/10/2016 (5-FU infusion  further reduced and oxaliplatin reduced)  Cycle 7 FOLFOX 12/31/2016  Cycle 8 FOLFOX 01/21/2017   CT 02/24/2017-slight decrease in size of the mesenteric mass, resolution of soft tissue fullness at the left vaginal cuff, no evidence of disease progression  CT  abdomen/pelvis 06/20/2017-stable ill-defined soft tissue at the mesenteric root, mild increased asymmetry at the left vaginal cuff, no other evidence of disease progression  Elevated CA 19-9 09/11/2018  CT abdomen/pelvis 11/18/2017-no evidence of recurrent pancreas cancer, dilated afferentloop, intrahepatic biliary dilatation  PET scan 01/16/2018-low level hypermetabolic activity in the porta hepatis which appears to correspond with a mildly enlarged lymph node on MRI. No other suspicious nodal activity in the abdomen. Small right paratracheal and subcarinal lymph nodes with low-level hypermetabolic activity in the chest. No abnormal activity within the liver. Findings suspicious for obstruction at the right ureteropelvic junction. Asymmetric left oropharyngeal activity.  CT abdomen/pelvis 03/31/2018-significant increaseinright-sided retroperitoneal soft tissue which invades and encases the IVC, celiac trunk, SMA, duodenum and right renal pelvis. Interval progression of intrahepatic and common bile duct dilatation. New right-sided obstructive uropathy secondary to tumor invasion of the right renal pelvis.  Cycle 1 gemcitabine/Abraxane 04/08/2018  Cycle 2 gemcitabine/Abraxane 04/22/2018  CT 04/26/2018- slight decrease in tumor encasing the duodenum, IVC, right renal pelvis, and celiac with slight decrease in hydroureteronephrosis  Cycle 3 gemcitabine/Abraxane 05/12/2018   2. bile duct obstruction secondary to #1, status post an ERCP with stent placement 09/23/2015hypermetabolic soft tissue in the central mesentery, no other evidence of metastatic disease, stable mildly enlarged portal caval node  3. Admission with post ERCP pancreatitis 06/16/2014  4. History of abdominal pain secondary to #1  5. Pulmonary embolism diagnosed on a CT of the abdomen 09/16/2014  Negative lower extremity Dopplers 09/17/2014  6. Multiple orthopedic surgical procedures  7. Endometrial  cancer,stage IA, grade 1 endometrioid adenocarcinoma, 18% myometrial invasion, no lymphovascular space involvement, negative washings  Status post robotic total hysterectomy and bilateral salpingo-oophorectomy 11/30/2010  Recurrent tumor left lateral vagina status post biopsy 11/24/2014 with pathology confirming adenocarcinoma with focal squamous differentiation consistent with endometrial adenocarcinoma  Staging CT scans 12/06/2014 with no evidence of local pancreatic cancer recurrence. Small fluid collection adjacent to the left adrenal gland. Severe hepatic steatosis. No evidence of local extension of endometrial carcinoma. Carcinoma not well-defined at the vaginal cuff. 5 mm right external iliac lymph node. 3.6 mm left external iliac lymph node  Brachytherapy initiated 12/22/2014, completed 01/19/2015  CT abdomen/pelvis 07/24/2015 revealed a 3 x 4 cm soft tissue focus at the vaginal apex  PET scan 08/11/2015 revealed no mass at the vaginal apex and no evidence of metastatic disease  CT 11/28/2016-mild asymmetric soft tissue at the left vaginal cuff, resolved on CT 02/24/2017  CT abdomen/pelvis 06/20/2017-stable mild ill-defined soft tissue density in the mesenteric root. Stable mild portacaval lymphadenopathy and subcentimeter right retroperitoneal lymph nodes. Mild increased size of asymmetric soft tissue density involving the left vaginal cuff.  PET scan 07/19/2017-no suspicious hypermetabolic activity within the neck, chest, abdomen or pelvis. Specifically no evidence of residual hypermetabolic tumor in the surgical bed or at the vaginal cuff. Hypermetabolic activity in the lumbar spine at the level of the right L3-4 facet joint appears degenerative.  MRI abdomen 12/02/2017-focal area of abnormal signal in the caudate lobe of the liver-unclear etiology, dilation of the hepaticojejunostomy loop, bile ducts, and pancreatic duct-stricture formation versus recurrent tumor  8. History of  atrial fibrillation-maintained on xarelto  9. Family history of multiple cancers-negative CancerNext gene panel  10.  Prolonged nausea following the pancreaticoduodenectomy. Improved 10/26/2014.  11. Port-A-Cath placement 10/21/2014.  12. History of Neutropenia secondary to chemotherapy   13. Diarrhea. Question pancreatic insufficiency. Pancreatic enzyme replacement initiated 01/05/2015. Recurrent diarrhea following a course of antibiotics March 2017.  14. History of positional vertigo-resolved  15. 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 Neutropenia secondary to chemotherapy, G-CSF was added with cycle 2 gemcitabine/Abraxane  17. Delayed nausea and diarrhea following FOLFOX. Emend added with cycle 2. Decadron prophylaxis added with cycle 3  18. Diarrhea 10/28/2016. Question related to chemotherapy. Negative C. difficile testing 10/31/2016.  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  20.Admission 12/02/2017 with Bacteroides bacteremia  Biliary obstruction documented on CT/MRI 12/02/2017  Upper endoscopy 12/05/2017 revealed angulation of the apparent limb precluding intubation  Status post placement of a biliary drain 12/05/2017, replaced 12/09/2017  Bile duct brushings 12/09/2017-negative for malignancy  Biliary drain cholangiogram 12/18/2017-there is patency of the biliary-enteric anastomosis with no contrast traverses the afferentlimb into the jejunum  Jejunal stent placed 01/28/2018, brush biopsy negative for malignancy  Biliary drain removed 02/11/2018  21.Admission 12/29/2017 through 01/02/2018 with sepsis secondary to Klebsiella bacteremia/cholangitis. Cholangiogram 12/30/2017 showed high-grade stenosis/stricture of the proximal draining jejunal loops.Internal/external biliary drainage catheter  placed.  22.Right pelvicaliectasis on the PET scan 01/16/2018 with perinephric soft tissue fullness, the right ureter is not dilated 23.  Admission 04/28/2018 with nausea/vomiting, increased abdominal pain, and a headache  Blood culture from 04/28/2018+ for Klebsiella pneumoniae, completed outpatient course of ampicillin-sulbactam  Port-A-Cath removed 05/02/2018 24.  Anemia secondary to chronic disease, bleeding, and chemotherapy      Disposition: Ms. Langenberg appears stable.  She is completing the outpatient course of antibiotics for treatment of Klebsiella sepsis.  The Port-A-Cath was removed during the recent hospital admission.  She now has a right PICC.  There is no evidence of progressive pancreas cancer.  The plan is to continue gemcitabine/Abraxane.  She will complete cycle 3 today.  She will receive G-CSF following this cycle of chemotherapy. She has severe anemia secondary to chemotherapy, chronic disease, recent sepsis, and bleeding.  She will be transfused with a unit of packed red blood cells today.  Ms. Marczak will return for an office visit and chemotherapy in 2 weeks. Betsy Coder, MD  05/12/2018  9:08 AM

## 2018-05-13 ENCOUNTER — Telehealth: Payer: Self-pay

## 2018-05-13 ENCOUNTER — Inpatient Hospital Stay: Payer: Medicare Other

## 2018-05-13 ENCOUNTER — Encounter: Payer: Self-pay | Admitting: Oncology

## 2018-05-13 ENCOUNTER — Ambulatory Visit: Payer: Medicare Other

## 2018-05-13 VITALS — BP 135/89 | HR 63 | Temp 98.2°F | Resp 16

## 2018-05-13 DIAGNOSIS — Z5111 Encounter for antineoplastic chemotherapy: Secondary | ICD-10-CM | POA: Diagnosis not present

## 2018-05-13 DIAGNOSIS — C259 Malignant neoplasm of pancreas, unspecified: Secondary | ICD-10-CM

## 2018-05-13 LAB — TYPE AND SCREEN
ABO/RH(D): O POS
ANTIBODY SCREEN: NEGATIVE
UNIT DIVISION: 0

## 2018-05-13 LAB — BPAM RBC
BLOOD PRODUCT EXPIRATION DATE: 201909172359
ISSUE DATE / TIME: 201908201358
UNIT TYPE AND RH: 5100

## 2018-05-13 LAB — CANCER ANTIGEN 19-9: CA 19-9: 847 U/mL — ABNORMAL HIGH (ref 0–35)

## 2018-05-13 MED ORDER — PEGFILGRASTIM-CBQV 6 MG/0.6ML ~~LOC~~ SOSY
6.0000 mg | PREFILLED_SYRINGE | Freq: Once | SUBCUTANEOUS | Status: AC
  Administered 2018-05-13: 6 mg via SUBCUTANEOUS

## 2018-05-13 MED ORDER — PEGFILGRASTIM-CBQV 6 MG/0.6ML ~~LOC~~ SOSY
PREFILLED_SYRINGE | SUBCUTANEOUS | Status: AC
  Filled 2018-05-13: qty 0.6

## 2018-05-13 NOTE — Telephone Encounter (Signed)
Please call her with the CA 19-9 result, not changed significantly, liver enzymes were higher and this may explain the slight elevation, plan to continue monitoring the CA-19-9 while on treatment with gemcitabine/Abraxane

## 2018-05-13 NOTE — Telephone Encounter (Signed)
Spoke with pt regarding MyChart message. This RN relayed response ffrom Dr. [name] below. Pt voiced understanding.

## 2018-05-17 ENCOUNTER — Encounter: Payer: Self-pay | Admitting: Oncology

## 2018-05-18 ENCOUNTER — Telehealth: Payer: Self-pay | Admitting: *Deleted

## 2018-05-18 ENCOUNTER — Encounter: Payer: Self-pay | Admitting: Cardiology

## 2018-05-18 ENCOUNTER — Ambulatory Visit: Payer: Medicare Other | Admitting: Cardiology

## 2018-05-18 VITALS — BP 126/64 | HR 59 | Ht 62.0 in | Wt 131.0 lb

## 2018-05-18 DIAGNOSIS — I480 Paroxysmal atrial fibrillation: Secondary | ICD-10-CM | POA: Diagnosis not present

## 2018-05-18 DIAGNOSIS — Z86711 Personal history of pulmonary embolism: Secondary | ICD-10-CM | POA: Diagnosis not present

## 2018-05-18 MED ORDER — RIVAROXABAN 20 MG PO TABS: 20.0000 mg | ORAL_TABLET | Freq: Every day | ORAL | 6 refills | Status: DC

## 2018-05-18 NOTE — Telephone Encounter (Signed)
Verbal order given to [facility] with [facility] to continue picc dressing changes at home on the off week of chemo- dressing changes will be completed here before chemo every other week.
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
## 2018-05-18 NOTE — Patient Instructions (Signed)
Medication Instructions:  The current medical regimen is effective;  continue present plan and medications.  Follow-Up: Follow up in 6 months with Dr. Marlou Porch.  You will receive a letter in the mail 2 months before you are due.  Please call us when you receive this letter to schedule your follow up appointment.  Any Other Special Instructions Will Be Listed Below (If Applicable). Please wear knee high compression stockings daily.  You may remove at bedtime.  Thank you for choosing [facility]!!

## 2018-05-19 ENCOUNTER — Other Ambulatory Visit: Payer: Medicare Other

## 2018-05-19 ENCOUNTER — Ambulatory Visit: Payer: Medicare Other

## 2018-05-19 ENCOUNTER — Ambulatory Visit: Payer: Medicare Other | Admitting: Nurse Practitioner

## 2018-05-20 ENCOUNTER — Ambulatory Visit: Payer: Medicare Other | Admitting: Infectious Diseases

## 2018-05-20 ENCOUNTER — Encounter: Payer: Self-pay | Admitting: Infectious Diseases

## 2018-05-20 ENCOUNTER — Ambulatory Visit: Payer: Medicare Other

## 2018-05-20 VITALS — BP 134/80 | HR 63 | Temp 98.6°F | Wt 138.0 lb

## 2018-05-20 DIAGNOSIS — E43 Unspecified severe protein-calorie malnutrition: Secondary | ICD-10-CM

## 2018-05-20 DIAGNOSIS — K8309 Other cholangitis: Secondary | ICD-10-CM

## 2018-05-20 DIAGNOSIS — K9189 Other postprocedural complications and disorders of digestive system: Secondary | ICD-10-CM | POA: Diagnosis not present

## 2018-05-20 DIAGNOSIS — R7881 Bacteremia: Secondary | ICD-10-CM | POA: Diagnosis not present

## 2018-05-20 DIAGNOSIS — Z95828 Presence of other vascular implants and grafts: Secondary | ICD-10-CM

## 2018-05-20 DIAGNOSIS — B961 Klebsiella pneumoniae [K. pneumoniae] as the cause of diseases classified elsewhere: Secondary | ICD-10-CM

## 2018-05-20 DIAGNOSIS — C250 Malignant neoplasm of head of pancreas: Secondary | ICD-10-CM | POA: Diagnosis not present

## 2018-05-20 DIAGNOSIS — R609 Edema, unspecified: Secondary | ICD-10-CM

## 2018-05-20 NOTE — Patient Instructions (Addendum)
Would try some compression stockings on your legs - I do think some of your fluid is from low protein in your blood since you have not been eating normally. With the IV antibiotics being done (they are made in salt water/saline) I anticipate that this will improve for you.   Would try to pick some foods off the list below to try to maximize your protein intake even with small snacks or options. Hopefully some of them sound appealing for you.   Will recheck your blood work today - final call with results will be 5 days. No news is good news until then!   If your liver function tests stay elevated it may be best to repeat an ultrasound or scan to evaluate.   We are happy to see you back here in the future! I hope this is the final visit here with our team.   Will send note to Dr. [name] to review.    High-Protein and High-Calorie Diet Eating high-protein and high-calorie foods can help you to gain weight, heal after an injury, and recover after an illness or surgery. What is my plan? The specific amount of daily protein and calories you need depends on:  Your body weight.  The reason this diet is recommended for you.  Generally, a high-protein, high-calorie diet involves:  Eating 250-500 extra calories each day.  Making sure that 10-35% of your daily calories come from protein.  Talk to your health care provider about how much protein and how many calories you need each day. Follow the diet as directed by your health care provider. What do I need to know about this diet?  Ask your health care provider if you should take a nutritional supplement.  Try to eat six small meals each day instead of three large meals.  Eat a balanced diet, including one food that is high in protein at each meal.  Keep nutritious snacks handy, such as nuts, trail mixes, dried fruit, and yogurt.  If you have kidney disease or diabetes, eating too much protein may put extra stress on your kidneys. Talk  to your health care provider if you have either of those conditions. What are some high-protein foods? Grains Quinoa. Bulgur wheat. Vegetables Soybeans. Peas. Meats and Other Protein Sources Beef, pork, and poultry. Fish and seafood. Eggs. Tofu. Textured vegetable protein (TVP). Peanut butter. Nuts and seeds. Dried beans. Protein powders. Dairy Whole milk. Whole-milk yogurt. Powdered milk. Cheese. [employer]. Eggnog. Beverages High-protein supplement drinks. Soy milk. Other Protein bars. The items listed above may not be a complete list of recommended foods or beverages. Contact your dietitian for more options. What are some high-calorie foods? Grains Pasta. Quick breads. Muffins. Pancakes. Ready-to-eat cereal. Vegetables Vegetables cooked in oil or butter. Fried potatoes. Fruits Dried fruit. Fruit leather. Canned fruit in syrup. Fruit juice. Avocados. Meats and Other Protein Sources Peanut butter. Nuts and seeds. Dairy Heavy cream. Whipped cream. Cream cheese. Sour cream. Ice cream. Custard. Pudding. Beverages Meal-replacement beverages. Nutrition shakes. Fruit juice. Sugar-sweetened soft drinks. Condiments Salad dressing. Mayonnaise. Alfredo sauce. Fruit preserves or jelly. Honey. Syrup. Sweets/Desserts Cake. Cookies. Pie. Pastries. Candy bars. Chocolate. Fats and Oils Butter or margarine. Oil. Gravy. Other Meal-replacement bars. The items listed above may not be a complete list of recommended foods or beverages. Contact your dietitian for more options. What are some tips for including high-protein and high-calorie foods in my diet?  Add whole milk, half-and-half, or heavy cream to cereal, pudding, soup, or hot  cocoa.  Add whole milk to instant breakfast drinks.  Add peanut butter to oatmeal or smoothies.  Add powdered milk to baked goods, smoothies, or milkshakes.  Add powdered milk, cream, or butter to mashed potatoes.  Add cheese to cooked vegetables.  Make  whole-milk yogurt parfaits. Top them with granola, fruit, or nuts.  Add cottage cheese to your fruit.  Add avocados, cheese, or both to sandwiches or salads.  Add meat, poultry, or seafood to rice, pasta, casseroles, salads, and soups.  Use mayonnaise when making egg salad, chicken salad, or tuna salad.  Use peanut butter as a topping for pretzels, celery, or crackers.  Add beans to casseroles, dips, and spreads.  Add pureed beans to sauces and soups.  Replace calorie-free drinks with calorie-containing drinks, such as milk and fruit juice. This information is not intended to replace advice given to you by your health care provider. Make sure you discuss any questions you have with your health care provider. Document Released: 09/09/2005 Document Revised: 02/15/2016 Document Reviewed: 02/22/2014 Elsevier Interactive Patient Education  [employer].

## 2018-05-20 NOTE — Assessment & Plan Note (Signed)
Serum albumin 2.8 in the setting of altered liver function and poor nutrition that is multifactorial considering several back to back hospitalizations, chemotherapy, 2 episodes of GNR sepsis/bacteremia and post-prandial abdominal pain. Although her weight is up on the scale this is due to her third spacing; I suspect she has actually had some loss.   Wt Readings from Last 3 Encounters:  05/20/18 138 lb (62.6 kg)  05/18/18 131 lb (59.4 kg)  05/12/18 133 lb 8 oz (60.6 kg)   Reviewed high protein/calorie food choices today. She would probably benefit from meeting with dietician team if she is willing.

## 2018-05-20 NOTE — Assessment & Plan Note (Addendum)
Oncology notes since discharge reviewed. Received blood transfusion a few days ago for Hgb 7. Has noticed maybe a little improvement after this but not much. More swelling. She is very tearful and frustrated with all that has transpired. I had a long discussion with her today and reviewed last few hospitalizations and explained events and welcomed/answered all questions that I could answer relating to her infection.

## 2018-05-20 NOTE — Assessment & Plan Note (Signed)
Seen at [facility] by Dr. Delrae Alfred after hospitalization in April for consideration of enteral stent placement per Dr. Marlowe Aschoff referral. At this time she was already scheduled for transhepatic jejunal stent placement which she had on 01-28-18 with the following recommendations: "If the IR procedure next week fails I have offered two possible options of which are 1. placement of an enteral stent across jejunal stricture or 2. placement of lumen opposing stent from the stomach into the afferent limb."  If her recurrent klebsiella bacteremia was in fact not due to port and due to recurrent GI source/process perhaps this option should further be explored?   We attempted to discuss this today but she became extremely tearful when discussion "yet another doctor appointment she may need to do." I will send a message to Dr. [name] whom is much more familiar with her and her family to discuss consideration since she still has ongoing epigastric pain, post prandial abdominal pain and ongoing elevation of LFTs including alk phos (930s now).

## 2018-05-20 NOTE — Assessment & Plan Note (Addendum)
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 She is s/p port removal and completed 14d therapy with amp-sulbactam. She also has artificial left shoulder and b/l TKRs. Her left shoulder does bother her but not more than usual and knees are stable.  Will check repeat blood cultures today for surveillance. Her PICC line is in place and currently being used for chemotherapy for now.
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
## 2018-05-20 NOTE — Assessment & Plan Note (Signed)
Related to low serum albumin/protein-calorie malnutrition, poor oral intake, blood transfusion and IV antibiotics that are mixed in saline. I am hopeful some of this will improve now that she is off amp-sulbactam. Advised thigh high compression stockings - she will go get some today.

## 2018-05-20 NOTE — Assessment & Plan Note (Signed)
S/P removal for suspected contributing nidus of relapsed klebsiella bacteremia. She is awaiting OK from oncology team to consider re-implanting this. Surveillance blood cultures in process now. Continue PICC line per oncology's direction.

## 2018-05-20 NOTE — Assessment & Plan Note (Signed)
Normal total bilirubin but lifer function elevation persists and alk phos even higher. She has ongoing abdominal pain and specifically cites post prandial pain that is interfearing with eating. Normal WBC level on labs done a few days prior with oncology team. I am suspicious that the source of her recurrent klebsiella bacteremia was due to GI/biliary source; she has received 14 days of therapy following clearance of her blood cultures and removal of chest port. She has been stable since stopping antibiotics 4 days ago.   CT scan 03-31-18 prior to admission indicated progression in intrahepatic biliary, CBD and pancreatic duct dilation compared to study 02-11-18 when her perc drain was removed. At admission she had repeat imaging showing slight improvement with CBD 1.55m and panc duct down to 458m   Would MRCP be helpful here vs ultrasound vs ERCP? I think GI follow up would be helpful however she is a little resistant/frustrated with that suggestion today.

## 2018-05-24 ENCOUNTER — Other Ambulatory Visit: Payer: Self-pay | Admitting: Oncology

## 2018-05-26 ENCOUNTER — Inpatient Hospital Stay: Payer: Medicare Other

## 2018-05-26 ENCOUNTER — Inpatient Hospital Stay (HOSPITAL_BASED_OUTPATIENT_CLINIC_OR_DEPARTMENT_OTHER): Payer: Medicare Other | Admitting: Oncology

## 2018-05-26 ENCOUNTER — Telehealth: Payer: Self-pay | Admitting: Oncology

## 2018-05-26 ENCOUNTER — Inpatient Hospital Stay: Payer: Medicare Other | Attending: Oncology

## 2018-05-26 ENCOUNTER — Other Ambulatory Visit: Payer: Self-pay

## 2018-05-26 VITALS — BP 122/72 | HR 65 | Temp 97.7°F | Resp 18 | Ht 62.0 in | Wt 134.6 lb

## 2018-05-26 DIAGNOSIS — C259 Malignant neoplasm of pancreas, unspecified: Secondary | ICD-10-CM

## 2018-05-26 DIAGNOSIS — C250 Malignant neoplasm of head of pancreas: Secondary | ICD-10-CM | POA: Diagnosis present

## 2018-05-26 DIAGNOSIS — I4891 Unspecified atrial fibrillation: Secondary | ICD-10-CM | POA: Diagnosis not present

## 2018-05-26 DIAGNOSIS — D701 Agranulocytosis secondary to cancer chemotherapy: Secondary | ICD-10-CM | POA: Diagnosis not present

## 2018-05-26 DIAGNOSIS — C541 Malignant neoplasm of endometrium: Secondary | ICD-10-CM

## 2018-05-26 DIAGNOSIS — D649 Anemia, unspecified: Secondary | ICD-10-CM | POA: Insufficient documentation

## 2018-05-26 DIAGNOSIS — Z5189 Encounter for other specified aftercare: Secondary | ICD-10-CM | POA: Insufficient documentation

## 2018-05-26 DIAGNOSIS — Z7901 Long term (current) use of anticoagulants: Secondary | ICD-10-CM | POA: Insufficient documentation

## 2018-05-26 DIAGNOSIS — Z5111 Encounter for antineoplastic chemotherapy: Secondary | ICD-10-CM | POA: Diagnosis not present

## 2018-05-26 DIAGNOSIS — Z95828 Presence of other vascular implants and grafts: Secondary | ICD-10-CM

## 2018-05-26 LAB — CMP (CANCER CENTER ONLY)
ALT: 19 U/L (ref 0–44)
ANION GAP: 8 (ref 5–15)
AST: 39 U/L (ref 15–41)
Albumin: 2.6 g/dL — ABNORMAL LOW (ref 3.5–5.0)
Alkaline Phosphatase: 521 U/L — ABNORMAL HIGH (ref 38–126)
BUN: 14 mg/dL (ref 8–23)
CO2: 25 mmol/L (ref 22–32)
Calcium: 8.1 mg/dL — ABNORMAL LOW (ref 8.9–10.3)
Chloride: 104 mmol/L (ref 98–111)
Creatinine: 0.84 mg/dL (ref 0.44–1.00)
Glucose, Bld: 100 mg/dL — ABNORMAL HIGH (ref 70–99)
POTASSIUM: 3.5 mmol/L (ref 3.5–5.1)
Sodium: 137 mmol/L (ref 135–145)
TOTAL PROTEIN: 5.8 g/dL — AB (ref 6.5–8.1)
Total Bilirubin: 0.6 mg/dL (ref 0.3–1.2)

## 2018-05-26 LAB — CBC WITH DIFFERENTIAL (CANCER CENTER ONLY)
BASOS ABS: 0 10*3/uL (ref 0.0–0.1)
Basophils Relative: 0 %
Eosinophils Absolute: 0.3 10*3/uL (ref 0.0–0.5)
Eosinophils Relative: 4 %
HCT: 23.1 % — ABNORMAL LOW (ref 34.8–46.6)
HEMOGLOBIN: 7.5 g/dL — AB (ref 11.6–15.9)
LYMPHS PCT: 11 %
Lymphs Abs: 0.8 10*3/uL — ABNORMAL LOW (ref 0.9–3.3)
MCH: 29.1 pg (ref 25.1–34.0)
MCHC: 32.5 g/dL (ref 31.5–36.0)
MCV: 89.5 fL (ref 79.5–101.0)
Monocytes Absolute: 0.6 10*3/uL (ref 0.1–0.9)
Monocytes Relative: 8 %
NEUTROS ABS: 5.7 10*3/uL (ref 1.5–6.5)
NEUTROS PCT: 77 %
PLATELETS: 192 10*3/uL (ref 145–400)
RBC: 2.58 MIL/uL — AB (ref 3.70–5.45)
RDW: 21.6 % — ABNORMAL HIGH (ref 11.2–14.5)
WBC: 7.5 10*3/uL (ref 3.9–10.3)

## 2018-05-26 LAB — SAMPLE TO BLOOD BANK

## 2018-05-26 MED ORDER — SODIUM CHLORIDE 0.9 % IV SOLN
Freq: Once | INTRAVENOUS | Status: AC
  Administered 2018-05-26: 14:00:00 via INTRAVENOUS
  Filled 2018-05-26: qty 250

## 2018-05-26 MED ORDER — PROCHLORPERAZINE MALEATE 10 MG PO TABS
10.0000 mg | ORAL_TABLET | Freq: Once | ORAL | Status: AC
  Administered 2018-05-26: 10 mg via ORAL

## 2018-05-26 MED ORDER — SODIUM CHLORIDE 0.9 % IV SOLN
800.0000 mg/m2 | Freq: Once | INTRAVENOUS | Status: AC
  Administered 2018-05-26: 1292 mg via INTRAVENOUS
  Filled 2018-05-26: qty 33.98

## 2018-05-26 MED ORDER — SODIUM CHLORIDE 0.9 % IJ SOLN
10.0000 mL | INTRAMUSCULAR | Status: DC | PRN
  Administered 2018-05-26: 10 mL via INTRAVENOUS
  Filled 2018-05-26: qty 10

## 2018-05-26 MED ORDER — HEPARIN SOD (PORK) LOCK FLUSH 100 UNIT/ML IV SOLN
500.0000 [IU] | Freq: Once | INTRAVENOUS | Status: AC | PRN
  Administered 2018-05-26: 500 [IU]
  Filled 2018-05-26: qty 5

## 2018-05-26 MED ORDER — PACLITAXEL PROTEIN-BOUND CHEMO INJECTION 100 MG
100.0000 mg/m2 | Freq: Once | Status: AC
  Administered 2018-05-26: 150 mg via INTRAVENOUS
  Filled 2018-05-26: qty 30

## 2018-05-26 MED ORDER — PROCHLORPERAZINE MALEATE 10 MG PO TABS
ORAL_TABLET | ORAL | Status: AC
  Filled 2018-05-26: qty 1

## 2018-05-26 MED ORDER — SODIUM CHLORIDE 0.9% FLUSH
10.0000 mL | INTRAVENOUS | Status: DC | PRN
  Administered 2018-05-26: 10 mL
  Filled 2018-05-26: qty 10

## 2018-05-26 NOTE — Telephone Encounter (Signed)
Scheduled 9/3 los - gave patient AVS and calender per los.

## 2018-05-26 NOTE — Progress Notes (Signed)
Ok to treat with Hgb of 7.5 Per Dr. [name].

## 2018-05-26 NOTE — Progress Notes (Signed)
[facility] OFFICE PROGRESS NOTE   Diagnosis: Pancreas cancer  INTERVAL HISTORY:   Ms. Benedick completed another treatment with gemcitabine/Abraxane on 05/12/2018.  She reports no fever immediately following chemotherapy.  She had a fever of 100.9 degrees on 05/23/2018.  She has nausea after eating.  She reports 4-5 episodes of diarrhea since the last treatment.  Abdominal pain has improved since she started taking MSIR.  She has developed swelling in the feet.  She nosebleed since last treatment.  No other bleeding.  She has numbness in the hands and feet, this has increased slightly since beginning gemcitabine/Abraxane.  She felt better after the red cell transfusion 05/12/2018  Objective:  Vital signs in last 24 hours:  Blood pressure 122/72, pulse 65, temperature 97.7 F (36.5 C), temperature source Oral, resp. rate 18, height 5\' 2"  (1.575 m), weight 134 lb 9.6 oz (61.1 kg), SpO2 100 %.    HEENT: No thrush or ulcers Resp: Lungs clear bilaterally Cardio: Regular rate and rhythm GI: No hepatomegaly, mild tenderness in the upper abdomen, no mass Vascular: Trace edema at the lower leg and foot bilaterally   Portacath/PICC-without erythema  Lab Results:  Lab Results  Component Value Date   WBC 7.5 05/26/2018   HGB 7.5 (L) 05/26/2018   HCT 23.1 (L) 05/26/2018   MCV 89.5 05/26/2018   PLT 192 05/26/2018   NEUTROABS 5.7 05/26/2018    CMP  Lab Results  Component Value Date   NA 137 05/26/2018   K 3.5 05/26/2018   CL 104 05/26/2018   CO2 25 05/26/2018   GLUCOSE 100 (H) 05/26/2018   BUN 14 05/26/2018   CREATININE 0.84 05/26/2018   CALCIUM 8.1 (L) 05/26/2018   PROT 5.8 (L) 05/26/2018   ALBUMIN 2.6 (L) 05/26/2018   AST 39 05/26/2018   ALT 19 05/26/2018   ALKPHOS 521 (H) 05/26/2018   BILITOT 0.6 05/26/2018   GFRNONAA >60 05/26/2018   GFRAA >60 05/26/2018    Medications: I have reviewed the patient's current medications.   Assessment/Plan: 1. 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  Gemcitabine held 11/02/2014 due to neutropenia.  Gemcitabine 11/09/2014 dose reduced 800 mg/m.  Gemcitabine held 11/16/2014 due to neutropenia.  Gemcitabine resumed 11/23/2014 every 2 week schedule.  Cycle 6 gemcitabine 01/05/2015  Cycle 7 gemcitabine 01/18/2015  Cycle 8 gemcitabine 02/01/2015  Cycle 9 gemcitabine 02/15/2015  Cycle 10 gemcitabine 03/01/2015  Cycle 11 gemcitabine 03/15/2015  Cycle 12 gemcitabine 03/29/2015  Elevated CA 19-01 May 2016  CTs 06/17/2016-new soft tissue mass at the root of the mesentery with vascular involvement  PET 06/27/2016-hypermetabolic activity associated with soft tissue adjacent to surgical clips in the central mesentery  Status post SBRTto the mesenteric mass completed 07/26/2016  CT abdomen/pelvis 08/23/2016 -mesenteric mass stable to slightly decreased in size.  Cycle 1 FOLFOX 09/03/2016  Cycle 2 FOLFOX 09/30/2016  Cycle 3 FOLFOX 10/14/2016   Cycle 4 FOLFOX 11/05/2016 (5-FU bolus eliminated and 5-FU pump dose reduced)  Cycle 5 FOLFOX 11/19/2016  CT abdomen/pelvis 11/28/2016-decreased size of soft tissue at the small bowel mesentery, mild asymmetric soft tissue at the left vaginal cuff  Cycle 6 FOLFOX 12/10/2016 (5-FU infusion further reduced and oxaliplatin reduced)  Cycle 7 FOLFOX 12/31/2016  Cycle 8 FOLFOX 01/21/2017   CT 02/24/2017-slight decrease in size of the mesenteric mass, resolution of soft tissue fullness at the left vaginal cuff, no evidence of disease progression  CT abdomen/pelvis 06/20/2017-stable ill-defined soft tissue at the  mesenteric root, mild increased asymmetry at the left vaginal cuff, no other evidence of disease progression  Elevated CA 19-9 09/11/2018  CT abdomen/pelvis 11/18/2017-no evidence of recurrent pancreas cancer, dilated afferentloop, intrahepatic biliary dilatation  PET scan 01/16/2018-low level hypermetabolic activity in the porta hepatis which appears to correspond with a mildly enlarged lymph node on MRI. No other suspicious nodal activity in the abdomen. Small right paratracheal and subcarinal lymph nodes with low-level hypermetabolic activity in the chest. No abnormal activity within the liver. Findings suspicious for obstruction at the right ureteropelvic junction. Asymmetric left oropharyngeal activity.  CT abdomen/pelvis 03/31/2018-significant increaseinright-sided retroperitoneal soft tissue which invades and encases the IVC, celiac trunk, SMA, duodenum and right renal pelvis. Interval progression of intrahepatic and common bile duct dilatation. New right-sided obstructive uropathy secondary to tumor invasion of the right renal pelvis.  Cycle 1 gemcitabine/Abraxane 04/08/2018  Cycle 2 gemcitabine/Abraxane 04/22/2018   CT 04/26/2018- slight decrease in tumor encasing the duodenum, IVC, right renal pelvis, and celiac with slight decrease in hydroureteronephrosis  Cycle 3 gemcitabine/Abraxane 05/12/2018  Cycle 4 gemcitabine/Abraxane 05/26/2018   2. bile duct obstruction secondary to #1, status post an ERCP with stent placement 09/23/2015hypermetabolic soft tissue in the central mesentery, no other evidence of metastatic disease, stable mildly enlarged portal caval node  3. Admission with post ERCP pancreatitis 06/16/2014  4. History of abdominal pain secondary to #1  5. Pulmonary embolism diagnosed on a CT of the abdomen 09/16/2014  Negative lower extremity Dopplers 09/17/2014  6. Multiple orthopedic surgical procedures  7. Endometrial cancer,stage IA, grade 1  endometrioid adenocarcinoma, 18% myometrial invasion, no lymphovascular space involvement, negative washings  Status post robotic total hysterectomy and bilateral salpingo-oophorectomy 11/30/2010  Recurrent tumor left lateral vagina status post biopsy 11/24/2014 with pathology confirming adenocarcinoma with focal squamous differentiation consistent with endometrial adenocarcinoma  Staging CT scans 12/06/2014 with no evidence of local pancreatic cancer recurrence. Small fluid collection adjacent to the left adrenal gland. Severe hepatic steatosis. No evidence of local extension of endometrial carcinoma. Carcinoma not well-defined at the vaginal cuff. 5 mm right external iliac lymph node. 3.6 mm left external iliac lymph node  Brachytherapy initiated 12/22/2014, completed 01/19/2015  CT abdomen/pelvis 07/24/2015 revealed a 3 x 4 cm soft tissue focus at the vaginal apex  PET scan 08/11/2015 revealed no mass at the vaginal apex and no evidence of metastatic disease  CT 11/28/2016-mild asymmetric soft tissue at the left vaginal cuff, resolved on CT 02/24/2017  CT abdomen/pelvis 06/20/2017-stable mild ill-defined soft tissue density in the mesenteric root. Stable mild portacaval lymphadenopathy and subcentimeter right retroperitoneal lymph nodes. Mild increased size of asymmetric soft tissue density involving the left vaginal cuff.  PET scan 07/19/2017-no suspicious hypermetabolic activity within the neck, chest, abdomen or pelvis. Specifically no evidence of residual hypermetabolic tumor in the surgical bed or at the vaginal cuff. Hypermetabolic activity in the lumbar spine at the level of the right L3-4 facet joint appears degenerative.  MRI abdomen 12/02/2017-focal area of abnormal signal in the caudate lobe of the liver-unclear etiology, dilation of the hepaticojejunostomy loop, bile ducts, and pancreatic duct-stricture formation versus recurrent tumor  8. History of atrial  fibrillation-maintained on xarelto  9. Family history of multiple cancers-negative CancerNext gene panel  10. Prolonged nausea following the pancreaticoduodenectomy.  Improved 10/26/2014.  11. Port-A-Cath placement 10/21/2014.  12. History of Neutropenia secondary to chemotherapy   13. Diarrhea. Question pancreatic insufficiency. Pancreatic enzyme replacement initiated 01/05/2015. Recurrent diarrhea following a course of antibiotics March 2017.  14. History of positional vertigo-resolved  15. 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 Neutropenia secondary to chemotherapy, G-CSF was added with cycle 2 gemcitabine/Abraxane  17. Delayed nausea and diarrhea following FOLFOX. Emend added with cycle 2. Decadron prophylaxis added with cycle 3  18. Diarrhea 10/28/2016. Question related to chemotherapy. Negative C. difficile testing 10/31/2016.  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  20.Admission 12/02/2017 with Bacteroides bacteremia  Biliary obstruction documented on CT/MRI 12/02/2017  Upper endoscopy 12/05/2017 revealed angulation of the apparent limb precluding intubation  Status post placement of a biliary drain 12/05/2017, replaced 12/09/2017  Bile duct brushings 12/09/2017-negative for malignancy  Biliary drain cholangiogram 12/18/2017-there is patency of the biliary-enteric anastomosis with no contrast traverses the afferentlimb into the jejunum  Jejunal stent placed 01/28/2018, brush biopsy negative for malignancy  Biliary drain removed 02/11/2018  21.Admission 12/29/2017 through 01/02/2018 with sepsis secondary to Klebsiella bacteremia/cholangitis. Cholangiogram 12/30/2017 showed high-grade stenosis/stricture of the proximal draining jejunal loops.Internal/external biliary drainage catheter  placed.  22.Right pelvicaliectasis on the PET scan 01/16/2018 with perinephric soft tissue fullness, the right ureter is not dilated 23. Admission 04/28/2018 with nausea/vomiting, increased abdominal pain, and a headache  Blood culture from 04/28/2018+ for Klebsiella pneumoniae, completed outpatient course of ampicillin-sulbactam  Port-A-Cath removed 05/02/2018 24. Anemia secondary to chronic disease, bleeding, and chemotherapy    Disposition: Ms. Yochim has metastatic pancreas cancer.  Her overall status appears unchanged.  She will complete another treatment with gemcitabine/Abraxane today.  We will follow-up on the CA 19-9 from today.  She will return for an office visit and chemotherapy in 2 weeks.  We will hold Neulasta with this cycle of chemotherapy.  She has severe anemia secondary to chronic disease, malnutrition, and chemotherapy.  We will arrange for a red cell transfusion within the next 1-2 days.  She will contact us for a persistent fever.  I suspect she has intermittent bacteremia related to tumor involving the GI tract.  25 minutes were spent with the patient today.  The majority of the time was used for counseling and coordination of care.  Betsy Coder, MD  05/26/2018  12:19 PM

## 2018-05-26 NOTE — Patient Instructions (Signed)
[facility] Discharge Instructions for Patients Receiving Chemotherapy  Today you received the following chemotherapy agents:  Gemzar, Abraxane  To help prevent nausea and vomiting after your treatment, we encourage you to take your nausea medication as prescribed.   If you develop nausea and vomiting that is not controlled by your nausea medication, call the clinic.   BELOW ARE SYMPTOMS THAT SHOULD BE REPORTED IMMEDIATELY:  *FEVER GREATER THAN 100.5 F  *CHILLS WITH OR WITHOUT FEVER  NAUSEA AND VOMITING THAT IS NOT CONTROLLED WITH YOUR NAUSEA MEDICATION  *UNUSUAL SHORTNESS OF BREATH  *UNUSUAL BRUISING OR BLEEDING  TENDERNESS IN MOUTH AND THROAT WITH OR WITHOUT PRESENCE OF ULCERS  *URINARY PROBLEMS  *BOWEL PROBLEMS  UNUSUAL RASH Items with * indicate a potential emergency and should be followed up as soon as possible.  Feel free to call the clinic should you have any questions or concerns. The clinic phone number is [phone].  Please show the [facility] at check-in to the Emergency Department and triage nurse.   Blood Transfusion, Care After This sheet gives you information about how to care for yourself after your procedure. Your doctor may also give you more specific instructions. If you have problems or questions, contact your doctor. Follow these instructions at home:  Take over-the-counter and prescription medicines only as told by your doctor.  Go back to your normal activities as told by your doctor.  Follow instructions from your doctor about how to take care of the area where an IV tube was put into your vein (insertion site). Make sure you: ? Wash your hands with soap and water before you change your bandage (dressing). If there is no soap and water, use hand sanitizer. ? Change your bandage as told by your doctor.  Check your IV insertion site every day for signs of infection. Check for: ? More redness, swelling, or pain. ? More  fluid or blood. ? Warmth. ? Pus or a bad smell. Contact a doctor if:  You have more redness, swelling, or pain around the IV insertion site..  You have more fluid or blood coming from the IV insertion site.  Your IV insertion site feels warm to the touch.  You have pus or a bad smell coming from the IV insertion site.  Your pee (urine) turns pink, red, or brown.  You feel weak after doing your normal activities. Get help right away if:  You have signs of a serious allergic or body defense (immune) system reaction, including: ? Itchiness. ? Hives. ? Trouble breathing. ? Anxiety. ? Pain in your chest or lower back. ? Fever, flushing, and chills. ? Fast pulse. ? Rash. ? Watery poop (diarrhea). ? Throwing up (vomiting). ? Dark pee. ? Serious headache. ? Dizziness. ? Stiff neck. ? Yellow color in your face or the white parts of your eyes (jaundice). Summary  After a blood transfusion, return to your normal activities as told by your doctor.  Every day, check for signs of infection where the IV tube was put into your vein.  Some signs of infection are warm skin, more redness and pain, more fluid or blood, and pus or a bad smell where the needle went in.  Contact your doctor if you feel weak or have any unusual symptoms. This information is not intended to replace advice given to you by your health care provider. Make sure you discuss any questions you have with your health care provider. Document Released: 09/30/2014 Document Revised: 05/03/2016  Reviewed: 05/03/2016 Elsevier Interactive Patient Education  2017 [employer]  

## 2018-05-27 ENCOUNTER — Inpatient Hospital Stay: Payer: Medicare Other

## 2018-05-27 ENCOUNTER — Telehealth: Payer: Self-pay | Admitting: *Deleted

## 2018-05-27 VITALS — BP 139/76 | HR 65 | Temp 98.3°F | Resp 16

## 2018-05-27 DIAGNOSIS — C259 Malignant neoplasm of pancreas, unspecified: Secondary | ICD-10-CM

## 2018-05-27 DIAGNOSIS — Z5111 Encounter for antineoplastic chemotherapy: Secondary | ICD-10-CM | POA: Diagnosis not present

## 2018-05-27 LAB — PREPARE RBC (CROSSMATCH)

## 2018-05-27 LAB — CANCER ANTIGEN 19-9: CA 19-9: 535 U/mL — ABNORMAL HIGH (ref 0–35)

## 2018-05-27 LAB — CULTURE, BLOOD (SINGLE)
MICRO NUMBER: 91031377
MICRO NUMBER:: 91031383
RESULT: NO GROWTH
SPECIMEN QUALITY: ADEQUATE

## 2018-05-27 MED ORDER — SODIUM CHLORIDE 0.9% IV SOLUTION
250.0000 mL | Freq: Once | INTRAVENOUS | Status: AC
  Administered 2018-05-27: 250 mL via INTRAVENOUS
  Filled 2018-05-27: qty 250

## 2018-05-27 MED ORDER — HEPARIN SOD (PORK) LOCK FLUSH 100 UNIT/ML IV SOLN
250.0000 [IU] | INTRAVENOUS | Status: AC | PRN
  Administered 2018-05-27: 500 [IU]
  Filled 2018-05-27: qty 5

## 2018-05-27 MED ORDER — SODIUM CHLORIDE 0.9 % IJ SOLN
10.0000 mL | Freq: Once | INTRAMUSCULAR | Status: AC
  Administered 2018-05-27: 10 mL
  Filled 2018-05-27: qty 10

## 2018-05-27 NOTE — Patient Instructions (Signed)

## 2018-05-27 NOTE — Telephone Encounter (Signed)
-----   Message from [place] Callas, NP sent at 05/27/2018  8:25 AM EDT ----- Please call [name] to let her know that her blood cultures show no growth following treatment for her blood stream infection. I hope she is doing better and should she have any recurrence of fevers to please let our team know.

## 2018-05-27 NOTE — Telephone Encounter (Signed)
Left message at home answering machine with results, instructions to call back if her fevers return. Landis Gandy, RN

## 2018-05-27 NOTE — Progress Notes (Signed)
Please call Zriyah to let her know that her blood cultures show no growth following treatment for her blood stream infection. I hope she is doing better and should she have any recurrence of fevers to please let our team know.

## 2018-05-28 LAB — TYPE AND SCREEN
ABO/RH(D): O POS
ANTIBODY SCREEN: NEGATIVE
Unit division: 0

## 2018-05-28 LAB — BPAM RBC
Blood Product Expiration Date: 201910042359
ISSUE DATE / TIME: 201909041421
Unit Type and Rh: 5100

## 2018-06-01 ENCOUNTER — Telehealth: Payer: Self-pay | Admitting: *Deleted

## 2018-06-01 NOTE — Telephone Encounter (Signed)
Telephone call received from Home health nurse. Patient has red, inflamed and slightly warm right ankle. Foot is swollen into her toes. Pain and pressure when weight is applied. Discussed with Dr. [name]. Patient needs to address concern today- she will contact her PCP and get an appt today. If not able she will call this office back.

## 2018-06-02 ENCOUNTER — Other Ambulatory Visit: Payer: Medicare Other

## 2018-06-02 ENCOUNTER — Ambulatory Visit: Payer: Medicare Other

## 2018-06-02 ENCOUNTER — Ambulatory Visit: Payer: Medicare Other | Admitting: Oncology

## 2018-06-03 ENCOUNTER — Ambulatory Visit: Payer: Medicare Other

## 2018-06-03 ENCOUNTER — Encounter: Payer: Self-pay | Admitting: Oncology

## 2018-06-04 ENCOUNTER — Other Ambulatory Visit: Payer: Self-pay | Admitting: Oncology

## 2018-06-09 ENCOUNTER — Inpatient Hospital Stay (HOSPITAL_BASED_OUTPATIENT_CLINIC_OR_DEPARTMENT_OTHER): Payer: Medicare Other | Admitting: Nurse Practitioner

## 2018-06-09 ENCOUNTER — Inpatient Hospital Stay: Payer: Medicare Other

## 2018-06-09 ENCOUNTER — Encounter: Payer: Self-pay | Admitting: Nurse Practitioner

## 2018-06-09 VITALS — BP 109/67 | HR 57 | Temp 97.6°F | Resp 18 | Ht 62.0 in | Wt 124.8 lb

## 2018-06-09 DIAGNOSIS — C259 Malignant neoplasm of pancreas, unspecified: Secondary | ICD-10-CM

## 2018-06-09 DIAGNOSIS — Z95828 Presence of other vascular implants and grafts: Secondary | ICD-10-CM

## 2018-06-09 DIAGNOSIS — D701 Agranulocytosis secondary to cancer chemotherapy: Secondary | ICD-10-CM

## 2018-06-09 DIAGNOSIS — C541 Malignant neoplasm of endometrium: Secondary | ICD-10-CM

## 2018-06-09 DIAGNOSIS — I4891 Unspecified atrial fibrillation: Secondary | ICD-10-CM | POA: Diagnosis not present

## 2018-06-09 DIAGNOSIS — Z7901 Long term (current) use of anticoagulants: Secondary | ICD-10-CM

## 2018-06-09 DIAGNOSIS — C250 Malignant neoplasm of head of pancreas: Secondary | ICD-10-CM

## 2018-06-09 DIAGNOSIS — D649 Anemia, unspecified: Secondary | ICD-10-CM

## 2018-06-09 DIAGNOSIS — Z5111 Encounter for antineoplastic chemotherapy: Secondary | ICD-10-CM | POA: Diagnosis not present

## 2018-06-09 LAB — CMP (CANCER CENTER ONLY)
ALBUMIN: 3.1 g/dL — AB (ref 3.5–5.0)
ALT: 19 U/L (ref 0–44)
AST: 31 U/L (ref 15–41)
Alkaline Phosphatase: 476 U/L — ABNORMAL HIGH (ref 38–126)
Anion gap: 6 (ref 5–15)
BUN: 17 mg/dL (ref 8–23)
CHLORIDE: 104 mmol/L (ref 98–111)
CO2: 25 mmol/L (ref 22–32)
Calcium: 8.7 mg/dL — ABNORMAL LOW (ref 8.9–10.3)
Creatinine: 0.88 mg/dL (ref 0.44–1.00)
GFR, Est AFR Am: >60 mL/min (ref >60)
GLUCOSE: 99 mg/dL (ref 70–99)
Potassium: 4.5 mmol/L (ref 3.5–5.1)
Sodium: 135 mmol/L (ref 135–145)
Total Bilirubin: 0.5 mg/dL (ref 0.3–1.2)
Total Protein: 6.8 g/dL (ref 6.5–8.1)

## 2018-06-09 LAB — CBC WITH DIFFERENTIAL (CANCER CENTER ONLY)
Basophils Absolute: 0 10*3/uL (ref 0.0–0.1)
Basophils Relative: 1 %
EOS ABS: 0.2 10*3/uL (ref 0.0–0.5)
EOS PCT: 6 %
HCT: 25.5 % — ABNORMAL LOW (ref 34.8–46.6)
Hemoglobin: 8.4 g/dL — ABNORMAL LOW (ref 11.6–15.9)
LYMPHS ABS: 0.8 10*3/uL — AB (ref 0.9–3.3)
Lymphocytes Relative: 25 %
MCH: 30.4 pg (ref 25.1–34.0)
MCHC: 32.9 g/dL (ref 31.5–36.0)
MCV: 92.4 fL (ref 79.5–101.0)
MONO ABS: 0.3 10*3/uL (ref 0.1–0.9)
Monocytes Relative: 10 %
Neutro Abs: 1.9 10*3/uL (ref 1.5–6.5)
Neutrophils Relative %: 58 %
Platelet Count: 195 10*3/uL (ref 145–400)
RBC: 2.76 MIL/uL — ABNORMAL LOW (ref 3.70–5.45)
RDW: 18.9 % — AB (ref 11.2–14.5)
WBC: 3.2 10*3/uL — AB (ref 3.9–10.3)

## 2018-06-09 MED ORDER — SODIUM CHLORIDE 0.9 % IV SOLN
Freq: Once | INTRAVENOUS | Status: AC
  Administered 2018-06-09: 15:00:00 via INTRAVENOUS
  Filled 2018-06-09: qty 250

## 2018-06-09 MED ORDER — SODIUM CHLORIDE 0.9% FLUSH
10.0000 mL | INTRAVENOUS | Status: DC | PRN
  Administered 2018-06-09: 10 mL
  Filled 2018-06-09: qty 10

## 2018-06-09 MED ORDER — PROCHLORPERAZINE MALEATE 10 MG PO TABS
ORAL_TABLET | ORAL | Status: AC
  Filled 2018-06-09: qty 1

## 2018-06-09 MED ORDER — PACLITAXEL PROTEIN-BOUND CHEMO INJECTION 100 MG
100.0000 mg/m2 | Freq: Once | INTRAVENOUS | Status: AC
  Administered 2018-06-09: 150 mg via INTRAVENOUS
  Filled 2018-06-09: qty 30

## 2018-06-09 MED ORDER — MORPHINE SULFATE 15 MG PO TABS: 15.0000 mg | ORAL_TABLET | ORAL | 0 refills | Status: DC | PRN

## 2018-06-09 MED ORDER — SODIUM CHLORIDE 0.9 % IV SOLN
800.0000 mg/m2 | Freq: Once | INTRAVENOUS | Status: AC
  Administered 2018-06-09: 1292 mg via INTRAVENOUS
  Filled 2018-06-09: qty 33.98

## 2018-06-09 MED ORDER — PROCHLORPERAZINE MALEATE 10 MG PO TABS
10.0000 mg | ORAL_TABLET | Freq: Once | ORAL | Status: AC
  Administered 2018-06-09: 10 mg via ORAL

## 2018-06-09 MED ORDER — SODIUM CHLORIDE 0.9 % IJ SOLN
10.0000 mL | INTRAMUSCULAR | Status: DC | PRN
  Administered 2018-06-09: 10 mL via INTRAVENOUS
  Filled 2018-06-09: qty 10

## 2018-06-09 MED ORDER — HEPARIN SOD (PORK) LOCK FLUSH 100 UNIT/ML IV SOLN
500.0000 [IU] | Freq: Once | INTRAVENOUS | Status: AC | PRN
  Administered 2018-06-09: 250 [IU]
  Filled 2018-06-09: qty 5

## 2018-06-09 NOTE — Patient Instructions (Signed)
[facility] Discharge Instructions for Patients Receiving Chemotherapy  Today you received the following chemotherapy agents:  Gemzar, Abraxane  To help prevent nausea and vomiting after your treatment, we encourage you to take your nausea medication as prescribed.   If you develop nausea and vomiting that is not controlled by your nausea medication, call the clinic.   BELOW ARE SYMPTOMS THAT SHOULD BE REPORTED IMMEDIATELY:  *FEVER GREATER THAN 100.5 F  *CHILLS WITH OR WITHOUT FEVER  NAUSEA AND VOMITING THAT IS NOT CONTROLLED WITH YOUR NAUSEA MEDICATION  *UNUSUAL SHORTNESS OF BREATH  *UNUSUAL BRUISING OR BLEEDING  TENDERNESS IN MOUTH AND THROAT WITH OR WITHOUT PRESENCE OF ULCERS  *URINARY PROBLEMS  *BOWEL PROBLEMS  UNUSUAL RASH Items with * indicate a potential emergency and should be followed up as soon as possible.  Feel free to call the clinic should you have any questions or concerns. The clinic phone number is [phone].  Please show the [facility] at check-in to the Emergency Department and triage nurse.   Blood Transfusion, Care After This sheet gives you information about how to care for yourself after your procedure. Your doctor may also give you more specific instructions. If you have problems or questions, contact your doctor. Follow these instructions at home:  Take over-the-counter and prescription medicines only as told by your doctor.  Go back to your normal activities as told by your doctor.  Follow instructions from your doctor about how to take care of the area where an IV tube was put into your vein (insertion site). Make sure you: ? Wash your hands with soap and water before you change your bandage (dressing). If there is no soap and water, use hand sanitizer. ? Change your bandage as told by your doctor.  Check your IV insertion site every day for signs of infection. Check for: ? More redness, swelling, or pain. ? More  fluid or blood. ? Warmth. ? Pus or a bad smell. Contact a doctor if:  You have more redness, swelling, or pain around the IV insertion site..  You have more fluid or blood coming from the IV insertion site.  Your IV insertion site feels warm to the touch.  You have pus or a bad smell coming from the IV insertion site.  Your pee (urine) turns pink, red, or brown.  You feel weak after doing your normal activities. Get help right away if:  You have signs of a serious allergic or body defense (immune) system reaction, including: ? Itchiness. ? Hives. ? Trouble breathing. ? Anxiety. ? Pain in your chest or lower back. ? Fever, flushing, and chills. ? Fast pulse. ? Rash. ? Watery poop (diarrhea). ? Throwing up (vomiting). ? Dark pee. ? Serious headache. ? Dizziness. ? Stiff neck. ? Yellow color in your face or the white parts of your eyes (jaundice). Summary  After a blood transfusion, return to your normal activities as told by your doctor.  Every day, check for signs of infection where the IV tube was put into your vein.  Some signs of infection are warm skin, more redness and pain, more fluid or blood, and pus or a bad smell where the needle went in.  Contact your doctor if you feel weak or have any unusual symptoms. This information is not intended to replace advice given to you by your health care provider. Make sure you discuss any questions you have with your health care provider. Document Released: 09/30/2014 Document Revised: 05/03/2016  Reviewed: 05/03/2016 Elsevier Interactive Patient Education  2017 [employer]  

## 2018-06-09 NOTE — Progress Notes (Addendum)
[place] OFFICE PROGRESS NOTE   Diagnosis: Pancreas cancer  INTERVAL HISTORY:   [name] returns as scheduled.  She completed cycle 4 gemcitabine/Abraxane 05/26/2018.  She denies nausea/vomiting.  No mouth sores.  No diarrhea.  She has stable neuropathy symptoms.  She feels that morphine overall is controlling her pain.  She developed bilateral leg swelling and redness over the right foot and was seen in an emergency department 06/01/2018.  Right lower extremity venous Doppler was negative for DVT.  She was diagnosed with "cellulitis" and has completed a course of Keflex.  She reports weight loss.  Oral intake is decreased secondary to "heartburn".  Objective:  Vital signs in last 24 hours:  Blood pressure 109/67, pulse (!) 57, temperature 97.6 F (36.4 C), temperature source Oral, resp. rate 18, height 5\' 2"  (1.575 m), weight 124 lb 12.8 oz (56.6 kg), SpO2 100 %.    HEENT: No thrush or ulcers. Resp: Lungs clear bilaterally. Cardio: Regular rate and rhythm. GI: Firm fullness with associated tenderness at the right upper abdomen. Vascular: No leg edema.   Neuro: Alert and oriented. Skin: Dorsal aspect right foot with faint discoloration/erythema. Right upper extremity PICC without erythema.   Lab Results:  Lab Results  Component Value Date   WBC 3.2 (L) 06/09/2018   HGB 8.4 (L) 06/09/2018   HCT 25.5 (L) 06/09/2018   MCV 92.4 06/09/2018   PLT 195 06/09/2018   NEUTROABS 1.9 06/09/2018    Imaging:  No results found.  Medications: I have reviewed the patient's current medications.  Assessment/Plan: 1. 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  Gemcitabine held 11/02/2014 due to neutropenia.  Gemcitabine 11/09/2014 dose reduced 800 mg/m.  Gemcitabine held 11/16/2014 due to neutropenia.  Gemcitabine resumed 11/23/2014 every 2 week schedule.  Cycle 6 gemcitabine 01/05/2015  Cycle 7 gemcitabine 01/18/2015  Cycle 8 gemcitabine 02/01/2015  Cycle 9 gemcitabine 02/15/2015  Cycle 10 gemcitabine 03/01/2015  Cycle 11 gemcitabine 03/15/2015  Cycle 12 gemcitabine 03/29/2015  Elevated CA 19-01 May 2016  CTs 06/17/2016-new soft tissue mass at the root of the mesentery with vascular involvement  PET 06/27/2016-hypermetabolic activity associated with soft tissue adjacent to surgical clips in the central mesentery  Status post SBRTto the mesenteric mass completed 07/26/2016  CT abdomen/pelvis 08/23/2016 -mesenteric mass stable to slightly decreased in size.  Cycle 1 FOLFOX 09/03/2016  Cycle 2 FOLFOX 09/30/2016  Cycle 3 FOLFOX 10/14/2016   Cycle 4 FOLFOX 11/05/2016 (5-FU bolus eliminated and 5-FU pump dose reduced)  Cycle 5 FOLFOX 11/19/2016  CT abdomen/pelvis 11/28/2016-decreased size of soft tissue at the small bowel mesentery, mild asymmetric soft tissue at the left vaginal cuff  Cycle 6 FOLFOX 12/10/2016 (5-FU infusion further reduced and oxaliplatin reduced)  Cycle 7 FOLFOX 12/31/2016  Cycle 8 FOLFOX 01/21/2017   CT 02/24/2017-slight decrease in size of the mesenteric mass, resolution of soft tissue fullness at the left vaginal cuff, no evidence of disease progression  CT abdomen/pelvis 06/20/2017-stable ill-defined soft tissue at the mesenteric root, mild increased asymmetry at the left vaginal cuff, no other evidence of disease progression  Elevated CA 19-9 09/11/2018  CT abdomen/pelvis 11/18/2017-no evidence of recurrent pancreas cancer, dilated afferentloop, intrahepatic biliary dilatation  PET scan 01/16/2018-low level hypermetabolic activity  in the porta hepatis which  appears to correspond with a mildly enlarged lymph node on MRI. No other suspicious nodal activity in the abdomen. Small right paratracheal and subcarinal lymph nodes with low-level hypermetabolic activity in the chest. No abnormal activity within the liver. Findings suspicious for obstruction at the right ureteropelvic junction. Asymmetric left oropharyngeal activity.  CT abdomen/pelvis 03/31/2018-significant increaseinright-sided retroperitoneal soft tissue which invades and encases the IVC, celiac trunk, SMA, duodenum and right renal pelvis. Interval progression of intrahepatic and common bile duct dilatation. New right-sided obstructive uropathy secondary to tumor invasion of the right renal pelvis.  Cycle 1 gemcitabine/Abraxane 04/08/2018  Cycle 2 gemcitabine/Abraxane 04/22/2018   CT 04/26/2018- slight decrease in tumor encasing the duodenum, IVC, right renal pelvis, and celiac with slight decrease in hydroureteronephrosis  Cycle 3 gemcitabine/Abraxane 05/12/2018  Cycle 4 gemcitabine/Abraxane 05/26/2018  Cycle 5 gemcitabine/Abraxane 06/09/2018   2. bile duct obstruction secondary to #1, status post an ERCP with stent placement 09/23/2015hypermetabolic soft tissue in the central mesentery, no other evidence of metastatic disease, stable mildly enlarged portal caval node  3. Admission with post ERCP pancreatitis 06/16/2014  4. History of abdominal pain secondary to #1  5. Pulmonary embolism diagnosed on a CT of the abdomen 09/16/2014  Negative lower extremity Dopplers 09/17/2014  6. Multiple orthopedic surgical procedures  7. Endometrial cancer,stage IA, grade 1 endometrioid adenocarcinoma, 18% myometrial invasion, no lymphovascular space involvement, negative washings  Status post robotic total hysterectomy and bilateral salpingo-oophorectomy 11/30/2010  Recurrent tumor left lateral vagina status post biopsy 11/24/2014 with pathology confirming adenocarcinoma  with focal squamous differentiation consistent with endometrial adenocarcinoma  Staging CT scans 12/06/2014 with no evidence of local pancreatic cancer recurrence. Small fluid collection adjacent to the left adrenal gland. Severe hepatic steatosis. No evidence of local extension of endometrial carcinoma. Carcinoma not well-defined at the vaginal cuff. 5 mm right external iliac lymph node. 3.6 mm left external iliac lymph node  Brachytherapy initiated 12/22/2014, completed 01/19/2015  CT abdomen/pelvis 07/24/2015 revealed a 3 x 4 cm soft tissue focus at the vaginal apex  PET scan 08/11/2015 revealed no mass at the vaginal apex and no evidence of metastatic disease  CT 11/28/2016-mild asymmetric soft tissue at the left vaginal cuff, resolved on CT 02/24/2017  CT abdomen/pelvis 06/20/2017-stable mild ill-defined soft tissue density in the mesenteric root. Stable mild portacaval lymphadenopathy and subcentimeter right retroperitoneal lymph nodes. Mild increased size of asymmetric soft tissue density involving the left vaginal cuff.  PET scan 07/19/2017-no suspicious hypermetabolic activity within the neck, chest, abdomen or pelvis. Specifically no evidence of residual hypermetabolic tumor in the surgical bed or at the vaginal cuff. Hypermetabolic activity in the lumbar spine at the level of the right L3-4 facet joint appears degenerative.  MRI abdomen 12/02/2017-focal area of abnormal signal in the caudate lobe of the liver-unclear etiology, dilation of the hepaticojejunostomy loop, bile ducts, and pancreatic duct-stricture formation versus recurrent tumor  8. History of atrial fibrillation-maintained on xarelto  9. Family history of multiple cancers-negative CancerNext gene panel  10. Prolonged nausea following the pancreaticoduodenectomy. Improved 10/26/2014.  11. Port-A-Cath placement 10/21/2014.  12. History of Neutropenia secondary to chemotherapy   13. Diarrhea. Question  pancreatic insufficiency. Pancreatic enzyme replacement initiated 01/05/2015. Recurrent diarrhea following a course of antibiotics March 2017.  14. History of positional vertigo-resolved  15. 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 Neutropenia secondary to chemotherapy, G-CSF was added  with cycle 2 gemcitabine/Abraxane  17. Delayed nausea and diarrhea following FOLFOX. Emend added with cycle 2. Decadron prophylaxis added with cycle 3  18. Diarrhea 10/28/2016. Question related to chemotherapy. Negative C. difficile testing 10/31/2016.  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  20.Admission 12/02/2017 with Bacteroides bacteremia  Biliary obstruction documented on CT/MRI 12/02/2017  Upper endoscopy 12/05/2017 revealed angulation of the apparent limb precluding intubation  Status post placement of a biliary drain 12/05/2017, replaced 12/09/2017  Bile duct brushings 12/09/2017-negative for malignancy  Biliary drain cholangiogram 12/18/2017-there is patency of the biliary-enteric anastomosis with no contrast traverses the afferentlimb into the jejunum  Jejunal stent placed 01/28/2018, brush biopsy negative for malignancy  Biliary drain removed 02/11/2018  21.Admission 12/29/2017 through 01/02/2018 with sepsis secondary to Klebsiella bacteremia/cholangitis. 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  22.Right pelvicaliectasis on the PET scan 01/16/2018 with perinephric soft tissue fullness, the right ureter is not dilated 23. Admission 04/28/2018 with nausea/vomiting, increased abdominal pain, and a headache  Blood culture from 04/28/2018+ for Klebsiella pneumoniae, completed outpatient course of  ampicillin-sulbactam  Port-A-Cath removed 05/02/2018 24. Anemia secondary to chronic disease, bleeding,and chemotherapy    Disposition: Ms. Schulte appears unchanged.  She has completed 4 cycles of gemcitabine/Abraxane.  Plan to proceed with cycle 5 today as scheduled.  She will return for a Neulasta injection 06/10/2018.  For pain she will continue MSIR.  She was provided with a new prescription at today's visit.  For the "heartburn" she will resume Prilosec.  She will return for lab, follow-up and cycle 6 gemcitabine/Abraxane in 2 weeks.  She will contact the office in the interim with any problems.  Patient seen with Dr. [name].    Ned Card ANP/GNP-BC   06/09/2018  1:44 PM This was a shared visit with Ned Card.  Ms. Geng was interviewed and examined.  Her overall status appears unchanged.  She will continue gemcitabine/Abraxane. The erythema of the right foot may have been related to cellulitis versus a gemcitabine rash.  This appears to be improving.  Savannah Manson, MD

## 2018-06-10 ENCOUNTER — Inpatient Hospital Stay: Payer: Medicare Other

## 2018-06-10 ENCOUNTER — Telehealth: Payer: Self-pay | Admitting: Oncology

## 2018-06-10 VITALS — BP 106/74 | HR 52 | Temp 98.0°F | Resp 18

## 2018-06-10 DIAGNOSIS — C259 Malignant neoplasm of pancreas, unspecified: Secondary | ICD-10-CM

## 2018-06-10 DIAGNOSIS — Z5111 Encounter for antineoplastic chemotherapy: Secondary | ICD-10-CM | POA: Diagnosis not present

## 2018-06-10 LAB — CANCER ANTIGEN 19-9: CAN 19-9: 496 U/mL — AB (ref 0–35)

## 2018-06-10 MED ORDER — PEGFILGRASTIM-CBQV 6 MG/0.6ML ~~LOC~~ SOSY
PREFILLED_SYRINGE | SUBCUTANEOUS | Status: AC
  Filled 2018-06-10: qty 0.6

## 2018-06-10 MED ORDER — PEGFILGRASTIM-CBQV 6 MG/0.6ML ~~LOC~~ SOSY
6.0000 mg | PREFILLED_SYRINGE | Freq: Once | SUBCUTANEOUS | Status: AC
  Administered 2018-06-10: 6 mg via SUBCUTANEOUS

## 2018-06-10 NOTE — Telephone Encounter (Signed)
Called regarding injection time change

## 2018-06-10 NOTE — Patient Instructions (Signed)
Pegfilgrastim injection What is this medicine? PEGFILGRASTIM (PEG fil gra stim) is a long-acting granulocyte colony-stimulating factor that stimulates the growth of neutrophils, a type of white blood cell important in the body's fight against infection. It is used to reduce the incidence of fever and infection in patients with certain types of cancer who are receiving chemotherapy that affects the bone marrow, and to increase survival after being exposed to high doses of radiation. This medicine may be used for other purposes; ask your health care provider or pharmacist if you have questions. COMMON BRAND NAME(S): Neulasta What should I tell my health care provider before I take this medicine? They need to know if you have any of these conditions: -kidney disease -latex allergy -ongoing radiation therapy -sickle cell disease -skin reactions to acrylic adhesives (On-Body Injector only) -an unusual or allergic reaction to pegfilgrastim, filgrastim, other medicines, foods, dyes, or preservatives -pregnant or trying to get pregnant -breast-feeding How should I use this medicine? This medicine is for injection under the skin. If you get this medicine at home, you will be taught how to prepare and give the pre-filled syringe or how to use the On-body Injector. Refer to the patient Instructions for Use for detailed instructions. Use exactly as directed. Tell your healthcare provider immediately if you suspect that the On-body Injector may not have performed as intended or if you suspect the use of the On-body Injector resulted in a missed or partial dose. It is important that you put your used needles and syringes in a special sharps container. Do not put them in a trash can. If you do not have a sharps container, call your pharmacist or healthcare provider to get one. Talk to your pediatrician regarding the use of this medicine in children. While this drug may be prescribed for selected conditions,  precautions do apply. Overdosage: If you think you have taken too much of this medicine contact a poison control center or emergency room at once. NOTE: This medicine is only for you. Do not share this medicine with others. What if I miss a dose? It is important not to miss your dose. Call your doctor or health care professional if you miss your dose. If you miss a dose due to an On-body Injector failure or leakage, a new dose should be administered as soon as possible using a single prefilled syringe for manual use. What may interact with this medicine? Interactions have not been studied. Give your health care provider a list of all the medicines, herbs, non-prescription drugs, or dietary supplements you use. Also tell them if you smoke, drink alcohol, or use illegal drugs. Some items may interact with your medicine. This list may not describe all possible interactions. Give your health care provider a list of all the medicines, herbs, non-prescription drugs, or dietary supplements you use. Also tell them if you smoke, drink alcohol, or use illegal drugs. Some items may interact with your medicine. What should I watch for while using this medicine? You may need blood work done while you are taking this medicine. If you are going to need a MRI, CT scan, or other procedure, tell your doctor that you are using this medicine (On-Body Injector only). What side effects may I notice from receiving this medicine? Side effects that you should report to your doctor or health care professional as soon as possible: -allergic reactions like skin rash, itching or hives, swelling of the face, lips, or tongue -dizziness -fever -pain, redness, or irritation at site   where injected -pinpoint red spots on the skin -red or dark-brown urine -shortness of breath or breathing problems -stomach or side pain, or pain at the shoulder -swelling -tiredness -trouble passing urine or change in the amount of urine Side  effects that usually do not require medical attention (report to your doctor or health care professional if they continue or are bothersome): -bone pain -muscle pain This list may not describe all possible side effects. Call your doctor for medical advice about side effects. You may report side effects to FDA at 1-800-FDA-1088. Where should I keep my medicine? Keep out of the reach of children. Store pre-filled syringes in a refrigerator between 2 and 8 degrees C (36 and 46 degrees F). Do not freeze. Keep in carton to protect from light. Throw away this medicine if it is left out of the refrigerator for more than 48 hours. Throw away any unused medicine after the expiration date. NOTE: This sheet is a summary. It may not cover all possible information. If you have questions about this medicine, talk to your doctor, pharmacist, or health care provider.  2018 Elsevier/Gold Standard (2016-09-05 12:58:03)  

## 2018-06-17 ENCOUNTER — Ambulatory Visit: Payer: Medicare Other

## 2018-06-17 ENCOUNTER — Ambulatory Visit: Payer: Medicare Other | Admitting: Nurse Practitioner

## 2018-06-17 ENCOUNTER — Other Ambulatory Visit: Payer: Medicare Other

## 2018-06-21 ENCOUNTER — Other Ambulatory Visit: Payer: Self-pay | Admitting: Oncology

## 2018-06-23 ENCOUNTER — Encounter: Payer: Self-pay | Admitting: Oncology

## 2018-06-23 ENCOUNTER — Inpatient Hospital Stay: Payer: Medicare Other | Admitting: Nutrition

## 2018-06-23 ENCOUNTER — Inpatient Hospital Stay: Payer: Medicare Other

## 2018-06-23 ENCOUNTER — Inpatient Hospital Stay: Payer: Medicare Other | Attending: Oncology | Admitting: Oncology

## 2018-06-23 DIAGNOSIS — C250 Malignant neoplasm of head of pancreas: Secondary | ICD-10-CM | POA: Insufficient documentation

## 2018-06-23 DIAGNOSIS — D6481 Anemia due to antineoplastic chemotherapy: Secondary | ICD-10-CM | POA: Diagnosis not present

## 2018-06-23 DIAGNOSIS — R7881 Bacteremia: Secondary | ICD-10-CM | POA: Insufficient documentation

## 2018-06-23 DIAGNOSIS — R509 Fever, unspecified: Secondary | ICD-10-CM | POA: Diagnosis present

## 2018-06-23 DIAGNOSIS — D701 Agranulocytosis secondary to cancer chemotherapy: Secondary | ICD-10-CM | POA: Diagnosis not present

## 2018-06-23 DIAGNOSIS — I4891 Unspecified atrial fibrillation: Secondary | ICD-10-CM | POA: Diagnosis not present

## 2018-06-23 DIAGNOSIS — Z5111 Encounter for antineoplastic chemotherapy: Secondary | ICD-10-CM | POA: Insufficient documentation

## 2018-06-23 DIAGNOSIS — Z5189 Encounter for other specified aftercare: Secondary | ICD-10-CM | POA: Diagnosis not present

## 2018-06-23 DIAGNOSIS — Z95828 Presence of other vascular implants and grafts: Secondary | ICD-10-CM

## 2018-06-23 DIAGNOSIS — C541 Malignant neoplasm of endometrium: Secondary | ICD-10-CM

## 2018-06-23 DIAGNOSIS — Z8542 Personal history of malignant neoplasm of other parts of uterus: Secondary | ICD-10-CM | POA: Insufficient documentation

## 2018-06-23 DIAGNOSIS — C259 Malignant neoplasm of pancreas, unspecified: Secondary | ICD-10-CM

## 2018-06-23 DIAGNOSIS — Z7901 Long term (current) use of anticoagulants: Secondary | ICD-10-CM | POA: Diagnosis not present

## 2018-06-23 LAB — CBC WITH DIFFERENTIAL (CANCER CENTER ONLY)
BASOS ABS: 0 10*3/uL (ref 0.0–0.1)
BASOS PCT: 0 %
Eosinophils Absolute: 0 10*3/uL (ref 0.0–0.5)
Eosinophils Relative: 0 %
HEMATOCRIT: 27 % — AB (ref 34.8–46.6)
HEMOGLOBIN: 9 g/dL — AB (ref 11.6–15.9)
LYMPHS PCT: 7 %
Lymphs Abs: 1 10*3/uL (ref 0.9–3.3)
MCH: 30.8 pg (ref 25.1–34.0)
MCHC: 33.2 g/dL (ref 31.5–36.0)
MCV: 92.9 fL (ref 79.5–101.0)
MONOS PCT: 6 %
Monocytes Absolute: 0.8 10*3/uL (ref 0.1–0.9)
NEUTROS PCT: 87 %
Neutro Abs: 11.6 10*3/uL — ABNORMAL HIGH (ref 1.5–6.5)
Platelet Count: 94 10*3/uL — ABNORMAL LOW (ref 145–400)
RBC: 2.91 MIL/uL — ABNORMAL LOW (ref 3.70–5.45)
RDW: 22 % — ABNORMAL HIGH (ref 11.2–14.5)
WBC Count: 13.3 10*3/uL — ABNORMAL HIGH (ref 3.9–10.3)

## 2018-06-23 LAB — CMP (CANCER CENTER ONLY)
ALBUMIN: 3.1 g/dL — AB (ref 3.5–5.0)
ALK PHOS: 306 U/L — AB (ref 38–126)
ALT: 23 U/L (ref 0–44)
AST: 39 U/L (ref 15–41)
Anion gap: 6 (ref 5–15)
BUN: 20 mg/dL (ref 8–23)
CALCIUM: 8.5 mg/dL — AB (ref 8.9–10.3)
CO2: 24 mmol/L (ref 22–32)
Chloride: 102 mmol/L (ref 98–111)
Creatinine: 1.02 mg/dL — ABNORMAL HIGH (ref 0.44–1.00)
GFR, Estimated: 54 mL/min — ABNORMAL LOW (ref >60)
Glucose, Bld: 105 mg/dL — ABNORMAL HIGH (ref 70–99)
POTASSIUM: 4.6 mmol/L (ref 3.5–5.1)
SODIUM: 132 mmol/L — AB (ref 135–145)
TOTAL PROTEIN: 6.4 g/dL — AB (ref 6.5–8.1)
Total Bilirubin: 0.6 mg/dL (ref 0.3–1.2)

## 2018-06-23 LAB — SAMPLE TO BLOOD BANK

## 2018-06-23 MED ORDER — PROCHLORPERAZINE MALEATE 10 MG PO TABS
ORAL_TABLET | ORAL | Status: AC
  Filled 2018-06-23: qty 1

## 2018-06-23 MED ORDER — PROCHLORPERAZINE MALEATE 10 MG PO TABS
10.0000 mg | ORAL_TABLET | Freq: Once | ORAL | Status: AC
  Administered 2018-06-23: 10 mg via ORAL

## 2018-06-23 MED ORDER — SODIUM CHLORIDE 0.9 % IJ SOLN
10.0000 mL | INTRAMUSCULAR | Status: DC | PRN
  Administered 2018-06-23: 10 mL via INTRAVENOUS
  Filled 2018-06-23: qty 10

## 2018-06-23 MED ORDER — MORPHINE SULFATE 15 MG PO TABS
ORAL_TABLET | ORAL | Status: AC
  Filled 2018-06-23: qty 1

## 2018-06-23 MED ORDER — SODIUM CHLORIDE 0.9 % IV SOLN
650.0000 mg/m2 | Freq: Once | INTRAVENOUS | Status: AC
  Administered 2018-06-23: 1026 mg via INTRAVENOUS
  Filled 2018-06-23: qty 27

## 2018-06-23 MED ORDER — PROMETHAZINE HCL 12.5 MG PO TABS: 12.5000 mg | ORAL_TABLET | Freq: Four times a day (QID) | ORAL | 1 refills | Status: DC | PRN

## 2018-06-23 MED ORDER — PACLITAXEL PROTEIN-BOUND CHEMO INJECTION 100 MG
100.0000 mg/m2 | Freq: Once | INTRAVENOUS | Status: AC
  Administered 2018-06-23: 150 mg via INTRAVENOUS
  Filled 2018-06-23: qty 30

## 2018-06-23 MED ORDER — SODIUM CHLORIDE 0.9 % IV SOLN
Freq: Once | INTRAVENOUS | Status: AC
  Administered 2018-06-23: 13:00:00 via INTRAVENOUS
  Filled 2018-06-23: qty 250

## 2018-06-23 MED ORDER — MORPHINE SULFATE 15 MG PO TABS
15.0000 mg | ORAL_TABLET | Freq: Once | ORAL | Status: AC
  Administered 2018-06-23: 15 mg via ORAL

## 2018-06-23 MED ORDER — SODIUM CHLORIDE 0.9% FLUSH
10.0000 mL | INTRAVENOUS | Status: DC | PRN
  Administered 2018-06-23: 10 mL
  Filled 2018-06-23: qty 10

## 2018-06-23 MED ORDER — HEPARIN SOD (PORK) LOCK FLUSH 100 UNIT/ML IV SOLN
500.0000 [IU] | Freq: Once | INTRAVENOUS | Status: AC | PRN
  Administered 2018-06-23: 500 [IU]
  Filled 2018-06-23: qty 5

## 2018-06-23 NOTE — Progress Notes (Signed)
Nutrition follow-up completed with patient receiving infusion for pancreas cancer. Weight was documented as 122.9 pounds and appears to be trending down. Patient no longer takes pancreatic enzymes. Patient denies diarrhea. She has occasional nausea but reports Prilosec helps. She is drinking 1 Carnation breakfast essential daily.  Nutrition diagnosis: Unintended weight loss related to pancreas cancer and associated treatments as evidenced by no prior need for nutrition related information.  Intervention: Patient was educated to continue strategies for increasing calories and protein. Provided recipe for fortified milk and encouraged her to increase Carnation breakfast twice daily between meals. Questions were answered.  Teach back method used.  Monitoring, evaluation, goals: Patient will tolerate increased calories and protein to minimize weight loss.  Next visit: To be scheduled as needed.  **Disclaimer: This note was dictated with voice recognition software. Similar sounding words can inadvertently be transcribed and this note may contain transcription errors which may not have been corrected upon publication of note.**

## 2018-06-23 NOTE — Progress Notes (Signed)
[place] OFFICE PROGRESS NOTE   Diagnosis: Pancreas cancer  INTERVAL HISTORY:   [name] pleaded another treatment with gemcitabine/Abraxane on 06/09/2018.  She reports abdominal discomfort and fatigue following chemotherapy.  The pain is under good control.  She had a fever of greater than 101 degrees with chills this morning.  No other fever.  Objective:  Vital signs in last 24 hours:  Blood pressure (!) 105/55, pulse (!) 52, temperature 97.8 F (36.6 C), temperature source Oral, resp. rate 17, height 5\' 2"  (1.575 m), weight 122 lb 14.4 oz (55.7 kg), SpO2 100 %.    HEENT: No thrush or ulcers Resp: Lungs clear bilaterally Cardio: Irregular GI: No mass, mild tenderness in the right upper abdomen Vascular: No leg edema    Portacath/PICC-without erythema  Lab Results:  Lab Results  Component Value Date   WBC 13.3 (H) 06/23/2018   HGB 9.0 (L) 06/23/2018   HCT 27.0 (L) 06/23/2018   MCV 92.9 06/23/2018   PLT 94 (L) 06/23/2018   NEUTROABS 11.6 (H) 06/23/2018    CMP  Lab Results  Component Value Date   NA 135 06/09/2018   K 4.5 06/09/2018   CL 104 06/09/2018   CO2 25 06/09/2018   GLUCOSE 99 06/09/2018   BUN 17 06/09/2018   CREATININE 0.88 06/09/2018   CALCIUM 8.7 (L) 06/09/2018   PROT 6.8 06/09/2018   ALBUMIN 3.1 (L) 06/09/2018   AST 31 06/09/2018   ALT 19 06/09/2018   ALKPHOS 476 (H) 06/09/2018   BILITOT 0.5 06/09/2018   GFRNONAA >60 06/09/2018   GFRAA >60 06/09/2018    Medications: I have reviewed the patient's current medications.   Assessment/Plan: 1. 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  Gemcitabine held 11/02/2014 due to neutropenia.  Gemcitabine 11/09/2014 dose reduced 800 mg/m.  Gemcitabine held 11/16/2014 due to neutropenia.  Gemcitabine resumed 11/23/2014 every 2 week schedule.  Cycle 6 gemcitabine 01/05/2015  Cycle 7 gemcitabine 01/18/2015  Cycle 8 gemcitabine 02/01/2015  Cycle 9 gemcitabine 02/15/2015  Cycle 10 gemcitabine 03/01/2015  Cycle 11 gemcitabine 03/15/2015  Cycle 12 gemcitabine 03/29/2015  Elevated CA 19-01 May 2016  CTs 06/17/2016-new soft tissue mass at the root of the mesentery with vascular involvement  PET 06/27/2016-hypermetabolic activity associated with soft tissue adjacent to surgical clips in the central mesentery  Status post SBRTto the mesenteric mass completed 07/26/2016  CT abdomen/pelvis 08/23/2016 -mesenteric mass stable to slightly decreased in size.  Cycle 1 FOLFOX 09/03/2016  Cycle 2 FOLFOX 09/30/2016  Cycle 3 FOLFOX 10/14/2016   Cycle 4 FOLFOX 11/05/2016 (5-FU bolus eliminated and 5-FU pump dose reduced)  Cycle 5 FOLFOX 11/19/2016  CT abdomen/pelvis 11/28/2016-decreased size of soft tissue at the small bowel mesentery, mild asymmetric soft tissue at the left vaginal cuff  Cycle 6 FOLFOX 12/10/2016 (5-FU infusion further reduced and oxaliplatin reduced)  Cycle 7 FOLFOX 12/31/2016  Cycle 8 FOLFOX 01/21/2017   CT 02/24/2017-slight decrease in size of the mesenteric mass, resolution of soft tissue fullness at the left vaginal cuff, no evidence of disease progression  CT abdomen/pelvis 06/20/2017-stable ill-defined soft tissue at the mesenteric root, mild increased asymmetry at the left vaginal cuff, no other evidence of disease progression  Elevated CA 19-9 09/11/2018  CT abdomen/pelvis 11/18/2017-no evidence of recurrent pancreas cancer, dilated afferentloop, intrahepatic biliary dilatation  PET scan 01/16/2018-low level hypermetabolic activity in the porta hepatis which appears to  correspond with a mildly enlarged lymph node on MRI. No other suspicious nodal activity in the abdomen. Small right paratracheal and subcarinal lymph nodes with low-level hypermetabolic activity in the chest. No abnormal activity within the liver. Findings suspicious for obstruction at the right ureteropelvic junction. Asymmetric left oropharyngeal activity.  CT abdomen/pelvis 03/31/2018-significant increaseinright-sided retroperitoneal soft tissue which invades and encases the IVC, celiac trunk, SMA, duodenum and right renal pelvis. Interval progression of intrahepatic and common bile duct dilatation. New right-sided obstructive uropathy secondary to tumor invasion of the right renal pelvis.  Cycle 1 gemcitabine/Abraxane 04/08/2018  Cycle 2 gemcitabine/Abraxane 04/22/2018   CT 04/26/2018-slight decrease in tumor encasing the duodenum, IVC, right renal pelvis, and celiac with slight decrease in hydroureteronephrosis  Cycle 3 gemcitabine/Abraxane 05/12/2018  Cycle 4 gemcitabine/Abraxane 05/26/2018  Cycle 5 gemcitabine/Abraxane 06/09/2018  Cycle 6 gemcitabine/Abraxane 06/23/2018   2. bile duct obstruction secondary to #1, status post an ERCP with stent placement 09/23/2015hypermetabolic soft tissue in the central mesentery, no other evidence of metastatic disease, stable mildly enlarged portal caval node  3. Admission with post ERCP pancreatitis 06/16/2014  4. History of abdominal pain secondary to #1  5. Pulmonary embolism diagnosed on a CT of the abdomen 09/16/2014  Negative lower extremity Dopplers 09/17/2014  6. Multiple orthopedic surgical procedures  7. Endometrial cancer,stage IA, grade 1 endometrioid adenocarcinoma, 18% myometrial invasion, no lymphovascular space involvement, negative washings  Status post robotic total hysterectomy and bilateral salpingo-oophorectomy 11/30/2010  Recurrent tumor left lateral vagina status post biopsy 11/24/2014 with  pathology confirming adenocarcinoma with focal squamous differentiation consistent with endometrial adenocarcinoma  Staging CT scans 12/06/2014 with no evidence of local pancreatic cancer recurrence. Small fluid collection adjacent to the left adrenal gland. Severe hepatic steatosis. No evidence of local extension of endometrial carcinoma. Carcinoma not well-defined at the vaginal cuff. 5 mm right external iliac lymph node. 3.6 mm left external iliac lymph node  Brachytherapy initiated 12/22/2014, completed 01/19/2015  CT abdomen/pelvis 07/24/2015 revealed a 3 x 4 cm soft tissue focus at the vaginal apex  PET scan 08/11/2015 revealed no mass at the vaginal apex and no evidence of metastatic disease  CT 11/28/2016-mild asymmetric soft tissue at the left vaginal cuff, resolved on CT 02/24/2017  CT abdomen/pelvis 06/20/2017-stable mild ill-defined soft tissue density in the mesenteric root. Stable mild portacaval lymphadenopathy and subcentimeter right retroperitoneal lymph nodes. Mild increased size of asymmetric soft tissue density involving the left vaginal cuff.  PET scan 07/19/2017-no suspicious hypermetabolic activity within the neck, chest, abdomen or pelvis. Specifically no evidence of residual hypermetabolic tumor in the surgical bed or at the vaginal cuff. Hypermetabolic activity in the lumbar spine at the level of the right L3-4 facet joint appears degenerative.  MRI abdomen 12/02/2017-focal area of abnormal signal in the caudate lobe of the liver-unclear etiology, dilation of the hepaticojejunostomy loop, bile ducts, and pancreatic duct-stricture formation versus recurrent tumor  8. History of atrial fibrillation-maintained on xarelto  9. Family history of multiple cancers-negative CancerNext gene panel  10. Prolonged nausea following the pancreaticoduodenectomy. Improved 10/26/2014.  11. Port-A-Cath placement 10/21/2014.  12. History of Neutropenia secondary to chemotherapy    13. Diarrhea. Question pancreatic insufficiency. Pancreatic enzyme replacement initiated 01/05/2015. Recurrent diarrhea following a course of antibiotics March 2017.  14. History of positional vertigo-resolved  15. 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 Neutropenia secondary to chemotherapy, G-CSF was added with cycle 2 gemcitabine/Abraxane  17. Delayed nausea and diarrhea following FOLFOX. Emend added with cycle 2. Decadron prophylaxis added with cycle 3  18. Diarrhea 10/28/2016. Question related to chemotherapy. Negative C. difficile testing 10/31/2016.  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  20.Admission 12/02/2017 with Bacteroides bacteremia  Biliary obstruction documented on CT/MRI 12/02/2017  Upper endoscopy 12/05/2017 revealed angulation of the apparent limb precluding intubation  Status post placement of a biliary drain 12/05/2017, replaced 12/09/2017  Bile duct brushings 12/09/2017-negative for malignancy  Biliary drain cholangiogram 12/18/2017-there is patency of the biliary-enteric anastomosis with no contrast traverses the afferentlimb into the jejunum  Jejunal stent placed 01/28/2018, brush biopsy negative for malignancy  Biliary drain removed 02/11/2018  21.Admission 12/29/2017 through 01/02/2018 with sepsis secondary to Klebsiella bacteremia/cholangitis. 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  22.Right pelvicaliectasis on the PET scan 01/16/2018 with perinephric soft tissue fullness, the right ureter is not dilated 23. Admission 04/28/2018 with nausea/vomiting, increased abdominal pain, and a headache  Blood culture from 04/28/2018+ for Klebsiella pneumoniae, completed outpatient course  of ampicillin-sulbactam  Port-A-Cath removed 05/02/2018 24. Anemia secondary to chronic disease, bleeding,and chemotherapy   Disposition: [name] appears unchanged.  The CA 19-9 was lower on 06/09/2018.  There is no clinical evidence of disease progression.  She will complete another treatment with gemcitabine/Abraxane today.  The plan is to schedule a restaging CT after the treatment on 07/07/2018.  She had a fever last night.  We will check a blood culture today.  She has a history of recurrent bacteremia.  She will contact us for a recurrent fever.  She has mild thrombocytopenia today.  She will contact us for bleeding.  I does reduced the gemcitabine today.  25 minutes were spent with the patient today.  The majority of the time was used for counseling and coordination of care.  Betsy Coder, MD  06/23/2018  11:15 AM

## 2018-06-23 NOTE — Patient Instructions (Signed)
[facility] Discharge Instructions for Patients Receiving Chemotherapy  Today you received the following chemotherapy agents:  Gemzar, Abraxane  To help prevent nausea and vomiting after your treatment, we encourage you to take your nausea medication as prescribed.   If you develop nausea and vomiting that is not controlled by your nausea medication, call the clinic.   BELOW ARE SYMPTOMS THAT SHOULD BE REPORTED IMMEDIATELY:  *FEVER GREATER THAN 100.5 F  *CHILLS WITH OR WITHOUT FEVER  NAUSEA AND VOMITING THAT IS NOT CONTROLLED WITH YOUR NAUSEA MEDICATION  *UNUSUAL SHORTNESS OF BREATH  *UNUSUAL BRUISING OR BLEEDING  TENDERNESS IN MOUTH AND THROAT WITH OR WITHOUT PRESENCE OF ULCERS  *URINARY PROBLEMS  *BOWEL PROBLEMS  UNUSUAL RASH Items with * indicate a potential emergency and should be followed up as soon as possible.  Feel free to call the clinic should you have any questions or concerns. The clinic phone number is [phone].  Please show the [facility] at check-in to the Emergency Department and triage nurse.   Blood Transfusion, Care After This sheet gives you information about how to care for yourself after your procedure. Your doctor may also give you more specific instructions. If you have problems or questions, contact your doctor. Follow these instructions at home:  Take over-the-counter and prescription medicines only as told by your doctor.  Go back to your normal activities as told by your doctor.  Follow instructions from your doctor about how to take care of the area where an IV tube was put into your vein (insertion site). Make sure you: ? Wash your hands with soap and water before you change your bandage (dressing). If there is no soap and water, use hand sanitizer. ? Change your bandage as told by your doctor.  Check your IV insertion site every day for signs of infection. Check for: ? More redness, swelling, or pain. ? More  fluid or blood. ? Warmth. ? Pus or a bad smell. Contact a doctor if:  You have more redness, swelling, or pain around the IV insertion site..  You have more fluid or blood coming from the IV insertion site.  Your IV insertion site feels warm to the touch.  You have pus or a bad smell coming from the IV insertion site.  Your pee (urine) turns pink, red, or brown.  You feel weak after doing your normal activities. Get help right away if:  You have signs of a serious allergic or body defense (immune) system reaction, including: ? Itchiness. ? Hives. ? Trouble breathing. ? Anxiety. ? Pain in your chest or lower back. ? Fever, flushing, and chills. ? Fast pulse. ? Rash. ? Watery poop (diarrhea). ? Throwing up (vomiting). ? Dark pee. ? Serious headache. ? Dizziness. ? Stiff neck. ? Yellow color in your face or the white parts of your eyes (jaundice). Summary  After a blood transfusion, return to your normal activities as told by your doctor.  Every day, check for signs of infection where the IV tube was put into your vein.  Some signs of infection are warm skin, more redness and pain, more fluid or blood, and pus or a bad smell where the needle went in.  Contact your doctor if you feel weak or have any unusual symptoms. This information is not intended to replace advice given to you by your health care provider. Make sure you discuss any questions you have with your health care provider. Document Released: 09/30/2014 Document Revised: 05/03/2016  Reviewed: 05/03/2016 Elsevier Interactive Patient Education  2017 [employer]  

## 2018-06-23 NOTE — Progress Notes (Signed)
Ok to treat despite cbc/cmet counts. MD advised he dose decreased gemzar

## 2018-06-24 ENCOUNTER — Inpatient Hospital Stay (HOSPITAL_BASED_OUTPATIENT_CLINIC_OR_DEPARTMENT_OTHER): Payer: Medicare Other | Admitting: Medical

## 2018-06-24 ENCOUNTER — Inpatient Hospital Stay: Payer: Medicare Other

## 2018-06-24 ENCOUNTER — Encounter: Payer: Self-pay | Admitting: Oncology

## 2018-06-24 ENCOUNTER — Other Ambulatory Visit: Payer: Self-pay | Admitting: *Deleted

## 2018-06-24 VITALS — BP 108/69 | HR 55 | Temp 98.2°F | Resp 16 | Ht 66.0 in | Wt 123.6 lb

## 2018-06-24 DIAGNOSIS — R509 Fever, unspecified: Secondary | ICD-10-CM

## 2018-06-24 DIAGNOSIS — C250 Malignant neoplasm of head of pancreas: Secondary | ICD-10-CM

## 2018-06-24 DIAGNOSIS — Z95828 Presence of other vascular implants and grafts: Secondary | ICD-10-CM

## 2018-06-24 DIAGNOSIS — C259 Malignant neoplasm of pancreas, unspecified: Secondary | ICD-10-CM

## 2018-06-24 DIAGNOSIS — C541 Malignant neoplasm of endometrium: Secondary | ICD-10-CM

## 2018-06-24 DIAGNOSIS — Z5111 Encounter for antineoplastic chemotherapy: Secondary | ICD-10-CM | POA: Diagnosis not present

## 2018-06-24 LAB — CMP (CANCER CENTER ONLY)
ALBUMIN: 2.8 g/dL — AB (ref 3.5–5.0)
ALK PHOS: 239 U/L — AB (ref 38–126)
ALT: 18 U/L (ref 0–44)
AST: 34 U/L (ref 15–41)
Anion gap: 6 (ref 5–15)
BILIRUBIN TOTAL: 0.4 mg/dL (ref 0.3–1.2)
BUN: 24 mg/dL — AB (ref 8–23)
CALCIUM: 7.8 mg/dL — AB (ref 8.9–10.3)
CO2: 22 mmol/L (ref 22–32)
Chloride: 102 mmol/L (ref 98–111)
Creatinine: 1 mg/dL (ref 0.44–1.00)
GFR, Est AFR Am: >60 mL/min (ref >60)
GFR, Estimated: 55 mL/min — ABNORMAL LOW (ref >60)
GLUCOSE: 105 mg/dL — AB (ref 70–99)
Potassium: 4.2 mmol/L (ref 3.5–5.1)
Sodium: 130 mmol/L — ABNORMAL LOW (ref 135–145)
TOTAL PROTEIN: 6.1 g/dL — AB (ref 6.5–8.1)

## 2018-06-24 LAB — CBC WITH DIFFERENTIAL (CANCER CENTER ONLY)
BASOS ABS: 0 10*3/uL (ref 0.0–0.1)
BASOS PCT: 0 %
EOS PCT: 0 %
Eosinophils Absolute: 0 10*3/uL (ref 0.0–0.5)
HCT: 25.5 % — ABNORMAL LOW (ref 34.8–46.6)
Hemoglobin: 8.4 g/dL — ABNORMAL LOW (ref 11.6–15.9)
LYMPHS PCT: 7 %
Lymphs Abs: 0.7 10*3/uL — ABNORMAL LOW (ref 0.9–3.3)
MCH: 30.7 pg (ref 25.1–34.0)
MCHC: 33 g/dL (ref 31.5–36.0)
MCV: 92.9 fL (ref 79.5–101.0)
MONO ABS: 0.3 10*3/uL (ref 0.1–0.9)
Monocytes Relative: 3 %
Neutro Abs: 8.9 10*3/uL — ABNORMAL HIGH (ref 1.5–6.5)
Neutrophils Relative %: 90 %
PLATELETS: 96 10*3/uL — AB (ref 145–400)
RBC: 2.75 MIL/uL — ABNORMAL LOW (ref 3.70–5.45)
RDW: 21.8 % — AB (ref 11.2–14.5)
WBC Count: 9.9 10*3/uL (ref 3.9–10.3)

## 2018-06-24 LAB — URINALYSIS, COMPLETE (UACMP) WITH MICROSCOPIC
BACTERIA UA: NONE SEEN
Bilirubin Urine: NEGATIVE
Glucose, UA: NEGATIVE mg/dL
Hgb urine dipstick: NEGATIVE
Ketones, ur: NEGATIVE mg/dL
Leukocytes, UA: NEGATIVE
Nitrite: NEGATIVE
PH: 5 (ref 5.0–8.0)
Protein, ur: NEGATIVE mg/dL
SPECIFIC GRAVITY, URINE: 1.015 (ref 1.005–1.030)

## 2018-06-24 LAB — CANCER ANTIGEN 19-9: CA 19-9: 466 U/mL — ABNORMAL HIGH (ref 0–35)

## 2018-06-24 MED ORDER — CIPROFLOXACIN HCL 250 MG PO TABS: 250.0000 mg | ORAL_TABLET | Freq: Two times a day (BID) | ORAL | 0 refills | Status: DC

## 2018-06-24 MED ORDER — HEPARIN SOD (PORK) LOCK FLUSH 100 UNIT/ML IV SOLN
500.0000 [IU] | Freq: Once | INTRAVENOUS | Status: AC | PRN
  Administered 2018-06-24: 500 [IU] via INTRAVENOUS
  Filled 2018-06-24: qty 5

## 2018-06-24 MED ORDER — SODIUM CHLORIDE 0.9 % IJ SOLN
10.0000 mL | INTRAMUSCULAR | Status: DC | PRN
  Administered 2018-06-24: 10 mL via INTRAVENOUS
  Filled 2018-06-24: qty 10

## 2018-06-24 MED ORDER — PEGFILGRASTIM-CBQV 6 MG/0.6ML ~~LOC~~ SOSY
PREFILLED_SYRINGE | SUBCUTANEOUS | Status: AC
  Filled 2018-06-24: qty 0.6

## 2018-06-24 MED ORDER — PEGFILGRASTIM-CBQV 6 MG/0.6ML ~~LOC~~ SOSY
6.0000 mg | PREFILLED_SYRINGE | Freq: Once | SUBCUTANEOUS | Status: AC
  Administered 2018-06-24: 6 mg via SUBCUTANEOUS

## 2018-06-24 NOTE — Patient Instructions (Signed)
PICC Home Guide °A peripherally inserted central catheter (PICC) is a long, thin, flexible tube that is inserted into a vein in the upper arm. It is a form of intravenous (IV) access. It is considered to be a "central" line because the tip of the PICC ends in a large vein in your chest. This large vein is called the superior vena cava (SVC). The PICC tip ends in the SVC because there is a lot of blood flow in the SVC. This allows medicines and IV fluids to be quickly distributed throughout the body. The PICC is inserted using a sterile technique by a specially trained nurse or physician. After the PICC is inserted, a chest X-ray exam is done to be sure it is in the correct place. °A PICC may be placed for different reasons, such as: °· To give medicines and liquid nutrition that can only be given through a central line. Examples are: °? Certain antibiotic treatments. °? Chemotherapy. °? Total parenteral nutrition (TPN). °· To take frequent blood samples. °· To give IV fluids and blood products. °· If there is difficulty placing a peripheral intravenous (PIV) catheter. ° °If taken care of properly, a PICC can remain in place for several months. A PICC can also allow a person to go home from the hospital early. Medicine and PICC care can be managed at home by a family member or home health care team. °What problems can happen when I have a PICC? °Problems with a PICC can occasionally occur. These may include the following: °· A blood clot (thrombus) forming in or at the tip of the PICC. This can cause the PICC to become clogged. A clot-dissolving medicine called tissue plasminogen activator (tPA) can be given through the PICC to help break up the clot. °· Inflammation of the vein (phlebitis) in which the PICC is placed. Signs of inflammation may include redness, pain at the insertion site, red streaks, or being able to feel a "cord" in the vein where the PICC is located. °· Infection in the PICC or at the insertion  site. Signs of infection may include fever, chills, redness, swelling, or pus drainage from the PICC insertion site. °· PICC movement (malposition). The PICC tip may move from its original position due to excessive physical activity, forceful coughing, sneezing, or vomiting. °· A break or cut in the PICC. It is important to not use scissors near the PICC. °· Nerve or tendon irritation or injury during PICC insertion. ° °What should I keep in mind about activities when I have a PICC? °· You may bend your arm and move it freely. If your PICC is near or at the bend of your elbow, avoid activity with repeated motion at the elbow. °· Rest at home for the remainder of the day following PICC line insertion. °· Avoid lifting heavy objects as instructed by your health care provider. °· Avoid using a crutch with the arm on the same side as your PICC. You may need to use a walker. °What should I know about my PICC dressing? °· Keep your PICC bandage (dressing) clean and dry to prevent infection. °? Ask your health care provider when you may shower. Ask your health care provider to teach you how to wrap the PICC when you do take a shower. °· Change the PICC dressing as instructed by your health care provider. °· Change your PICC dressing if it becomes loose or wet. °What should I know about PICC care? °· Check the PICC insertion   site daily for leakage, redness, swelling, or pain. °· Do not take a bath, swim, or use hot tubs when you have a PICC. Cover PICC line with clear plastic wrap and tape to keep it dry while showering. °· Flush the PICC as directed by your health care provider. Let your health care provider know right away if the PICC is difficult to flush or does not flush. Do not use force to flush the PICC. °· Do not use a syringe that is less than 10 mL to flush the PICC. °· Never pull or tug on the PICC. °· Avoid blood pressure checks on the arm with the PICC. °· Keep your PICC identification card with you at all  times. °· Do not take the PICC out yourself. Only a trained clinical professional should remove the PICC. °Get help right away if: °· Your PICC is accidentally pulled all the way out. If this happens, cover the insertion site with a bandage or gauze dressing. Do not throw the PICC away. Your health care provider will need to inspect it. °· Your PICC was tugged or pulled and has partially come out. Do not  push the PICC back in. °· There is any type of drainage, redness, or swelling where the PICC enters the skin. °· You cannot flush the PICC, it is difficult to flush, or the PICC leaks around the insertion site when it is flushed. °· You hear a "flushing" sound when the PICC is flushed. °· You have pain, discomfort, or numbness in your arm, shoulder, or jaw on the same side as the PICC. °· You feel your heart "racing" or skipping beats. °· You notice a hole or tear in the PICC. °· You develop chills or a fever. °This information is not intended to replace advice given to you by your health care provider. Make sure you discuss any questions you have with your health care provider. °Document Released: 03/16/2003 Document Revised: 03/29/2016 Document Reviewed: 07/02/2013 °Elsevier Interactive Patient Education © 2017 [employer] ° °

## 2018-06-25 ENCOUNTER — Telehealth: Payer: Self-pay | Admitting: Emergency Medicine

## 2018-06-25 ENCOUNTER — Telehealth: Payer: Self-pay | Admitting: Medical

## 2018-06-25 LAB — BLOOD CULTURE ID PANEL (REFLEXED)
ACINETOBACTER BAUMANNII: NOT DETECTED
CANDIDA PARAPSILOSIS: NOT DETECTED
Candida albicans: NOT DETECTED
Candida glabrata: NOT DETECTED
Candida krusei: NOT DETECTED
Candida tropicalis: NOT DETECTED
ENTEROCOCCUS SPECIES: NOT DETECTED
Enterobacter cloacae complex: NOT DETECTED
Enterobacteriaceae species: NOT DETECTED
Escherichia coli: NOT DETECTED
HAEMOPHILUS INFLUENZAE: NOT DETECTED
KLEBSIELLA OXYTOCA: NOT DETECTED
Klebsiella pneumoniae: NOT DETECTED
LISTERIA MONOCYTOGENES: NOT DETECTED
METHICILLIN RESISTANCE: DETECTED — AB
Neisseria meningitidis: NOT DETECTED
PROTEUS SPECIES: NOT DETECTED
Pseudomonas aeruginosa: NOT DETECTED
STAPHYLOCOCCUS AUREUS BCID: NOT DETECTED
STAPHYLOCOCCUS SPECIES: DETECTED — AB
STREPTOCOCCUS PNEUMONIAE: NOT DETECTED
Serratia marcescens: NOT DETECTED
Streptococcus agalactiae: NOT DETECTED
Streptococcus pyogenes: NOT DETECTED
Streptococcus species: NOT DETECTED

## 2018-06-25 MED ORDER — AMOXICILLIN-POT CLAVULANATE 250-62.5 MG/5ML PO SUSR: 250.0000 mg | Freq: Two times a day (BID) | ORAL | 0 refills | Status: DC

## 2018-06-25 MED ORDER — AMOXICILLIN-POT CLAVULANATE 250-125 MG PO TABS: 1.0000 | ORAL_TABLET | Freq: Two times a day (BID) | ORAL | 5 refills | Status: DC

## 2018-06-25 NOTE — Telephone Encounter (Signed)
The patient was contacted regarding her prescription for Augmentin 250-1 25 that is been sent to her pharmacy.  She was instructed to take 2 twice daily.  Her blood culture results have returned showing no growth but there is a comment that there are GPC

## 2018-06-25 NOTE — Progress Notes (Signed)
These preliminary result these preliminary results were noted.  Awaiting final report.

## 2018-06-25 NOTE — Telephone Encounter (Signed)
Returned call to [facility] regarding abx scrip.  Changing cipro to augmentin tablets per PA Lucianne Lei, calling to alert [facility] of change from e-scrip sent.  Verbalized understanding to not give cipro to pt and to fill augmentin tablets scrip.

## 2018-06-26 ENCOUNTER — Other Ambulatory Visit: Payer: Self-pay | Admitting: *Deleted

## 2018-06-26 ENCOUNTER — Telehealth: Payer: Self-pay | Admitting: *Deleted

## 2018-06-26 DIAGNOSIS — C250 Malignant neoplasm of head of pancreas: Secondary | ICD-10-CM

## 2018-06-26 DIAGNOSIS — C259 Malignant neoplasm of pancreas, unspecified: Secondary | ICD-10-CM

## 2018-06-26 DIAGNOSIS — Z22322 Carrier or suspected carrier of Methicillin resistant Staphylococcus aureus: Secondary | ICD-10-CM

## 2018-06-26 DIAGNOSIS — Z95828 Presence of other vascular implants and grafts: Secondary | ICD-10-CM

## 2018-06-26 DIAGNOSIS — R509 Fever, unspecified: Secondary | ICD-10-CM

## 2018-06-26 LAB — URINE CULTURE

## 2018-06-26 NOTE — Progress Notes (Signed)
Telephone call to patient. Advised results of blood culture were positive for a staph infection, MRSA. Patient verbalized an understanding. Orders placed for home iv antibiotics. Patient reports she still has been having fevers ranging from 100-101.3.
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
## 2018-06-26 NOTE — Telephone Encounter (Signed)
Spoke to patient to touch base. Patient is expecting [facility] today at 5pm. Her medication is coming from [facility]. [facility] has been in contact with her. She is ready to start the home infusions. Patient states her fevers have been low grade all day. She offers no complaints. She expresses extreme gratitude to this office for arranging this treatment and staying vigilant.    She understands to call this office with any concerns or questions even on the weekend to speak to the on call provider.

## 2018-06-27 LAB — CULTURE, BLOOD (SINGLE)

## 2018-06-28 LAB — CULTURE, BLOOD (SINGLE)
Culture: NO GROWTH
Special Requests: ADEQUATE

## 2018-06-29 ENCOUNTER — Encounter: Payer: Self-pay | Admitting: Oncology

## 2018-06-30 ENCOUNTER — Other Ambulatory Visit: Payer: Medicare Other

## 2018-06-30 ENCOUNTER — Ambulatory Visit: Payer: Medicare Other

## 2018-06-30 ENCOUNTER — Ambulatory Visit: Payer: Medicare Other | Admitting: Oncology

## 2018-07-01 ENCOUNTER — Ambulatory Visit: Payer: Medicare Other

## 2018-07-05 ENCOUNTER — Other Ambulatory Visit: Payer: Self-pay | Admitting: Oncology

## 2018-07-07 ENCOUNTER — Inpatient Hospital Stay: Payer: Medicare Other

## 2018-07-07 ENCOUNTER — Encounter: Payer: Self-pay | Admitting: Oncology

## 2018-07-07 ENCOUNTER — Inpatient Hospital Stay (HOSPITAL_BASED_OUTPATIENT_CLINIC_OR_DEPARTMENT_OTHER): Payer: Medicare Other | Admitting: Oncology

## 2018-07-07 ENCOUNTER — Other Ambulatory Visit: Payer: Self-pay | Admitting: Emergency Medicine

## 2018-07-07 ENCOUNTER — Inpatient Hospital Stay: Payer: Medicare Other | Admitting: Nutrition

## 2018-07-07 ENCOUNTER — Telehealth: Payer: Self-pay | Admitting: Oncology

## 2018-07-07 VITALS — BP 125/82 | HR 48 | Temp 97.8°F | Resp 16 | Ht 66.0 in | Wt 121.0 lb

## 2018-07-07 VITALS — HR 50

## 2018-07-07 DIAGNOSIS — C541 Malignant neoplasm of endometrium: Secondary | ICD-10-CM

## 2018-07-07 DIAGNOSIS — R7881 Bacteremia: Secondary | ICD-10-CM

## 2018-07-07 DIAGNOSIS — D701 Agranulocytosis secondary to cancer chemotherapy: Secondary | ICD-10-CM

## 2018-07-07 DIAGNOSIS — Z8542 Personal history of malignant neoplasm of other parts of uterus: Secondary | ICD-10-CM

## 2018-07-07 DIAGNOSIS — C250 Malignant neoplasm of head of pancreas: Secondary | ICD-10-CM

## 2018-07-07 DIAGNOSIS — C259 Malignant neoplasm of pancreas, unspecified: Secondary | ICD-10-CM

## 2018-07-07 DIAGNOSIS — I4891 Unspecified atrial fibrillation: Secondary | ICD-10-CM

## 2018-07-07 DIAGNOSIS — R509 Fever, unspecified: Secondary | ICD-10-CM | POA: Diagnosis not present

## 2018-07-07 DIAGNOSIS — Z5111 Encounter for antineoplastic chemotherapy: Secondary | ICD-10-CM | POA: Diagnosis not present

## 2018-07-07 DIAGNOSIS — D6481 Anemia due to antineoplastic chemotherapy: Secondary | ICD-10-CM

## 2018-07-07 DIAGNOSIS — Z95828 Presence of other vascular implants and grafts: Secondary | ICD-10-CM

## 2018-07-07 DIAGNOSIS — Z7901 Long term (current) use of anticoagulants: Secondary | ICD-10-CM

## 2018-07-07 LAB — CBC WITH DIFFERENTIAL (CANCER CENTER ONLY)
Abs Immature Granulocytes: 0.12 K/uL — ABNORMAL HIGH (ref 0.00–0.07)
Basophils Absolute: 0 K/uL (ref 0.0–0.1)
Basophils Relative: 1 %
Eosinophils Absolute: 0.3 K/uL (ref 0.0–0.5)
Eosinophils Relative: 3 %
HCT: 26.8 % — ABNORMAL LOW (ref 36.0–46.0)
Hemoglobin: 8.9 g/dL — ABNORMAL LOW (ref 12.0–15.0)
Immature Granulocytes: 1 %
Lymphocytes Relative: 16 %
Lymphs Abs: 1.4 K/uL (ref 0.7–4.0)
MCH: 31.9 pg (ref 26.0–34.0)
MCHC: 33.2 g/dL (ref 30.0–36.0)
MCV: 96.1 fL (ref 80.0–100.0)
Monocytes Absolute: 0.6 K/uL (ref 0.1–1.0)
Monocytes Relative: 7 %
Neutro Abs: 6.2 K/uL (ref 1.7–7.7)
Neutrophils Relative %: 72 %
Platelet Count: 250 K/uL (ref 150–400)
RBC: 2.79 MIL/uL — ABNORMAL LOW (ref 3.87–5.11)
RDW: 19.3 % — ABNORMAL HIGH (ref 11.5–15.5)
WBC Count: 8.6 K/uL (ref 4.0–10.5)
nRBC: 0 % (ref 0.0–0.2)

## 2018-07-07 LAB — CMP (CANCER CENTER ONLY)
ALBUMIN: 3.1 g/dL — AB (ref 3.5–5.0)
ALT: 14 U/L (ref 0–44)
AST: 29 U/L (ref 15–41)
Alkaline Phosphatase: 212 U/L — ABNORMAL HIGH (ref 38–126)
Anion gap: 9 (ref 5–15)
BILIRUBIN TOTAL: 0.4 mg/dL (ref 0.3–1.2)
BUN: 15 mg/dL (ref 8–23)
CO2: 25 mmol/L (ref 22–32)
Calcium: 8.6 mg/dL — ABNORMAL LOW (ref 8.9–10.3)
Chloride: 103 mmol/L (ref 98–111)
Creatinine: 0.95 mg/dL (ref 0.44–1.00)
GFR, Est AFR Am: >60 mL/min (ref >60)
GFR, Estimated: 59 mL/min — ABNORMAL LOW (ref >60)
GLUCOSE: 102 mg/dL — AB (ref 70–99)
POTASSIUM: 4.8 mmol/L (ref 3.5–5.1)
Sodium: 137 mmol/L (ref 135–145)
TOTAL PROTEIN: 6.3 g/dL — AB (ref 6.5–8.1)

## 2018-07-07 MED ORDER — PROCHLORPERAZINE MALEATE 10 MG PO TABS
ORAL_TABLET | ORAL | Status: AC
  Filled 2018-07-07: qty 1

## 2018-07-07 MED ORDER — SODIUM CHLORIDE 0.9 % IJ SOLN
10.0000 mL | INTRAMUSCULAR | Status: DC | PRN
  Administered 2018-07-07: 10 mL via INTRAVENOUS
  Filled 2018-07-07: qty 10

## 2018-07-07 MED ORDER — PROCHLORPERAZINE MALEATE 10 MG PO TABS
10.0000 mg | ORAL_TABLET | Freq: Once | ORAL | Status: AC
  Administered 2018-07-07: 10 mg via ORAL

## 2018-07-07 MED ORDER — SODIUM CHLORIDE 0.9 % IV SOLN
Freq: Once | INTRAVENOUS | Status: AC
  Administered 2018-07-07: 10:00:00 via INTRAVENOUS
  Filled 2018-07-07: qty 250

## 2018-07-07 MED ORDER — PACLITAXEL PROTEIN-BOUND CHEMO INJECTION 100 MG
100.0000 mg/m2 | Freq: Once | INTRAVENOUS | Status: AC
  Administered 2018-07-07: 150 mg via INTRAVENOUS
  Filled 2018-07-07: qty 30

## 2018-07-07 MED ORDER — SODIUM CHLORIDE 0.9% FLUSH
10.0000 mL | INTRAVENOUS | Status: DC | PRN
  Administered 2018-07-07: 10 mL
  Filled 2018-07-07: qty 10

## 2018-07-07 MED ORDER — SODIUM CHLORIDE 0.9 % IV SOLN
650.0000 mg/m2 | Freq: Once | INTRAVENOUS | Status: AC
  Administered 2018-07-07: 1026 mg via INTRAVENOUS
  Filled 2018-07-07: qty 26.98

## 2018-07-07 MED ORDER — HEPARIN SOD (PORK) LOCK FLUSH 100 UNIT/ML IV SOLN
250.0000 [IU] | Freq: Once | INTRAVENOUS | Status: AC | PRN
  Administered 2018-07-07: 250 [IU]
  Filled 2018-07-07: qty 5

## 2018-07-07 MED ORDER — PROMETHAZINE HCL 12.5 MG PO TABS: 12.5000 mg | ORAL_TABLET | Freq: Four times a day (QID) | ORAL | 1 refills | Status: DC | PRN

## 2018-07-07 NOTE — Progress Notes (Signed)
err

## 2018-07-07 NOTE — Telephone Encounter (Signed)
Scheduled appt per 10/15 los - gave patient AVS and calender per los.   

## 2018-07-07 NOTE — Progress Notes (Signed)
Nutrition follow-up completed with patient receiving infusions for pancreas cancer. Weight was documented as 121 pounds which is stable overall. Nutrition intake seems to be stable. Patient has occasional nausea.  She continues to take Prilosec. She consumes 1-3 bottles of Carnation breakfast essentials.  Nutrition diagnosis: Unintended weight loss has stabilized.  Intervention: I educated patient to continue strategies for adequate calorie and protein intake. Provided coupons for [employer] essentials. Questions answered.  Teach back method used.  Monitoring, evaluation, goals: Patient will tolerate adequate calories and protein for weight maintenance.  Next visit: To be scheduled as needed.  **Disclaimer: This note was dictated with voice recognition software. Similar sounding words can inadvertently be transcribed and this note may contain transcription errors which may not have been corrected upon publication of note.**

## 2018-07-07 NOTE — Patient Instructions (Signed)
[facility] Discharge Instructions for Patients Receiving Chemotherapy  Today you received the following chemotherapy agents:  Gemzar, Abraxane  To help prevent nausea and vomiting after your treatment, we encourage you to take your nausea medication as prescribed.   If you develop nausea and vomiting that is not controlled by your nausea medication, call the clinic.   BELOW ARE SYMPTOMS THAT SHOULD BE REPORTED IMMEDIATELY:  *FEVER GREATER THAN 100.5 F  *CHILLS WITH OR WITHOUT FEVER  NAUSEA AND VOMITING THAT IS NOT CONTROLLED WITH YOUR NAUSEA MEDICATION  *UNUSUAL SHORTNESS OF BREATH  *UNUSUAL BRUISING OR BLEEDING  TENDERNESS IN MOUTH AND THROAT WITH OR WITHOUT PRESENCE OF ULCERS  *URINARY PROBLEMS  *BOWEL PROBLEMS  UNUSUAL RASH Items with * indicate a potential emergency and should be followed up as soon as possible.  Feel free to call the clinic should you have any questions or concerns. The clinic phone number is [phone].  Please show the [facility] at check-in to the Emergency Department and triage nurse.   Blood Transfusion, Care After This sheet gives you information about how to care for yourself after your procedure. Your doctor may also give you more specific instructions. If you have problems or questions, contact your doctor. Follow these instructions at home:  Take over-the-counter and prescription medicines only as told by your doctor.  Go back to your normal activities as told by your doctor.  Follow instructions from your doctor about how to take care of the area where an IV tube was put into your vein (insertion site). Make sure you: ? Wash your hands with soap and water before you change your bandage (dressing). If there is no soap and water, use hand sanitizer. ? Change your bandage as told by your doctor.  Check your IV insertion site every day for signs of infection. Check for: ? More redness, swelling, or pain. ? More  fluid or blood. ? Warmth. ? Pus or a bad smell. Contact a doctor if:  You have more redness, swelling, or pain around the IV insertion site..  You have more fluid or blood coming from the IV insertion site.  Your IV insertion site feels warm to the touch.  You have pus or a bad smell coming from the IV insertion site.  Your pee (urine) turns pink, red, or brown.  You feel weak after doing your normal activities. Get help right away if:  You have signs of a serious allergic or body defense (immune) system reaction, including: ? Itchiness. ? Hives. ? Trouble breathing. ? Anxiety. ? Pain in your chest or lower back. ? Fever, flushing, and chills. ? Fast pulse. ? Rash. ? Watery poop (diarrhea). ? Throwing up (vomiting). ? Dark pee. ? Serious headache. ? Dizziness. ? Stiff neck. ? Yellow color in your face or the white parts of your eyes (jaundice). Summary  After a blood transfusion, return to your normal activities as told by your doctor.  Every day, check for signs of infection where the IV tube was put into your vein.  Some signs of infection are warm skin, more redness and pain, more fluid or blood, and pus or a bad smell where the needle went in.  Contact your doctor if you feel weak or have any unusual symptoms. This information is not intended to replace advice given to you by your health care provider. Make sure you discuss any questions you have with your health care provider. Document Released: 09/30/2014 Document Revised: 05/03/2016  Reviewed: 05/03/2016 Elsevier Interactive Patient Education  2017 [employer]  

## 2018-07-07 NOTE — Progress Notes (Signed)
[name] OFFICE PROGRESS NOTE   Diagnosis: Pancreas cancer  INTERVAL HISTORY:   [name] completed another treatment with gemcitabine/Abraxane on 06/23/2018.  She received G-CSF on 06/24/2018. She was seen on 06/24/2018 with a high fever.  Blood culture from the PICC line returned positive for staph epidermidis.  A blood culture on 06/23/2018 was negative.  She completed a course of vancomycin.  Line she continues to have intermittent fever in the evening.  She reports last having a fever on 07/05/2018.  Her pain is controlled with MSIR.  She has numbness in the hands and feet.  This does not interfere with activity. Objective:  Vital signs in last 24 hours:  Blood pressure 125/82, pulse (!) 48, temperature 97.8 F (36.6 C), temperature source Oral, resp. rate 16, height 5\' 6"  (1.676 m), weight 121 lb (54.9 kg), SpO2 100 %.    HEENT: No thrush or ulcers Resp: Lungs clear bilaterally Cardio: Regular rate and rhythm GI: Mild diffuse tenderness, no mass, no hepatomegaly Vascular: No leg edema  Portacath/PICC-without erythema  Lab Results:  Lab Results  Component Value Date   WBC 8.6 07/07/2018   HGB 8.9 (L) 07/07/2018   HCT 26.8 (L) 07/07/2018   MCV 96.1 07/07/2018   PLT 250 07/07/2018   NEUTROABS 6.2 07/07/2018    CMP  Lab Results  Component Value Date   NA 137 07/07/2018   K 4.8 07/07/2018   CL 103 07/07/2018   CO2 25 07/07/2018   GLUCOSE 102 (H) 07/07/2018   BUN 15 07/07/2018   CREATININE 0.95 07/07/2018   CALCIUM 8.6 (L) 07/07/2018   PROT 6.3 (L) 07/07/2018   ALBUMIN 3.1 (L) 07/07/2018   AST 29 07/07/2018   ALT 14 07/07/2018   ALKPHOS 212 (H) 07/07/2018   BILITOT 0.4 07/07/2018   GFRNONAA 59 (L) 07/07/2018   GFRAA >60 07/07/2018     Medications: I have reviewed the patient's current medications.   Assessment/Plan: 1. 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  Gemcitabine held 11/02/2014 due to neutropenia.  Gemcitabine 11/09/2014 dose reduced 800 mg/m.  Gemcitabine held 11/16/2014 due to neutropenia.  Gemcitabine resumed 11/23/2014 every 2 week schedule.  Cycle 6 gemcitabine 01/05/2015  Cycle 7 gemcitabine 01/18/2015  Cycle 8 gemcitabine 02/01/2015  Cycle 9 gemcitabine 02/15/2015  Cycle 10 gemcitabine 03/01/2015  Cycle 11 gemcitabine 03/15/2015  Cycle 12 gemcitabine 03/29/2015  Elevated CA 19-01 May 2016  CTs 06/17/2016-new soft tissue mass at the root of the mesentery with vascular involvement  PET 06/27/2016-hypermetabolic activity associated with soft tissue adjacent to surgical clips in the central mesentery  Status post SBRTto the mesenteric mass completed 07/26/2016  CT abdomen/pelvis 08/23/2016 -mesenteric mass stable to slightly decreased in size.  Cycle 1 FOLFOX 09/03/2016  Cycle 2 FOLFOX 09/30/2016  Cycle 3 FOLFOX 10/14/2016   Cycle 4 FOLFOX 11/05/2016 (5-FU bolus eliminated and 5-FU pump dose reduced)  Cycle 5 FOLFOX 11/19/2016  CT abdomen/pelvis 11/28/2016-decreased size of soft tissue at the small bowel mesentery, mild asymmetric soft tissue at the left vaginal cuff  Cycle 6 FOLFOX 12/10/2016 (5-FU infusion further reduced and oxaliplatin reduced)  Cycle 7 FOLFOX 12/31/2016  Cycle 8 FOLFOX 01/21/2017   CT 02/24/2017-slight decrease in size of the mesenteric  mass, resolution of soft tissue fullness at the left vaginal cuff, no evidence of disease progression  CT abdomen/pelvis 06/20/2017-stable ill-defined soft tissue at the mesenteric root, mild increased asymmetry at the left vaginal cuff, no other evidence of disease  progression  Elevated CA 19-9 09/11/2018  CT abdomen/pelvis 11/18/2017-no evidence of recurrent pancreas cancer, dilated afferentloop, intrahepatic biliary dilatation  PET scan 01/16/2018-low level hypermetabolic activity in the porta hepatis which appears to correspond with a mildly enlarged lymph node on MRI. No other suspicious nodal activity in the abdomen. Small right paratracheal and subcarinal lymph nodes with low-level hypermetabolic activity in the chest. No abnormal activity within the liver. Findings suspicious for obstruction at the right ureteropelvic junction. Asymmetric left oropharyngeal activity.  CT abdomen/pelvis 03/31/2018-significant increaseinright-sided retroperitoneal soft tissue which invades and encases the IVC, celiac trunk, SMA, duodenum and right renal pelvis. Interval progression of intrahepatic and common bile duct dilatation. New right-sided obstructive uropathy secondary to tumor invasion of the right renal pelvis.  Cycle 1 gemcitabine/Abraxane 04/08/2018  Cycle 2 gemcitabine/Abraxane 04/22/2018   CT 04/26/2018-slight decrease in tumor encasing the duodenum, IVC, right renal pelvis, and celiac with slight decrease in hydroureteronephrosis  Cycle 3 gemcitabine/Abraxane 05/12/2018  Cycle 4 gemcitabine/Abraxane 05/26/2018  Cycle 5 gemcitabine/Abraxane 06/09/2018  Cycle 6 gemcitabine/Abraxane 06/23/2018  Cycle 7 gemcitabine/Abraxane 07/07/2018   2. bile duct obstruction secondary to #1, status post an ERCP with stent placement 09/23/2015hypermetabolic soft tissue in the central mesentery, no other evidence of metastatic disease, stable mildly enlarged portal caval node  3. Admission with post ERCP pancreatitis 06/16/2014  4. History of abdominal pain secondary to #1  5. Pulmonary embolism diagnosed on a CT of the abdomen 09/16/2014  Negative lower extremity Dopplers 09/17/2014  6. Multiple orthopedic surgical procedures  7.  Endometrial cancer,stage IA, grade 1 endometrioid adenocarcinoma, 18% myometrial invasion, no lymphovascular space involvement, negative washings  Status post robotic total hysterectomy and bilateral salpingo-oophorectomy 11/30/2010  Recurrent tumor left lateral vagina status post biopsy 11/24/2014 with pathology confirming adenocarcinoma with focal squamous differentiation consistent with endometrial adenocarcinoma  Staging CT scans 12/06/2014 with no evidence of local pancreatic cancer recurrence. Small fluid collection adjacent to the left adrenal gland. Severe hepatic steatosis. No evidence of local extension of endometrial carcinoma. Carcinoma not well-defined at the vaginal cuff. 5 mm right external iliac lymph node. 3.6 mm left external iliac lymph node  Brachytherapy initiated 12/22/2014, completed 01/19/2015  CT abdomen/pelvis 07/24/2015 revealed a 3 x 4 cm soft tissue focus at the vaginal apex  PET scan 08/11/2015 revealed no mass at the vaginal apex and no evidence of metastatic disease  CT 11/28/2016-mild asymmetric soft tissue at the left vaginal cuff, resolved on CT 02/24/2017  CT abdomen/pelvis 06/20/2017-stable mild ill-defined soft tissue density in the mesenteric root. Stable mild portacaval lymphadenopathy and subcentimeter right retroperitoneal lymph nodes. Mild increased size of asymmetric soft tissue density involving the left vaginal cuff.  PET scan 07/19/2017-no suspicious hypermetabolic activity within the neck, chest, abdomen or pelvis. Specifically no evidence of residual hypermetabolic tumor in the surgical bed or at the vaginal cuff. Hypermetabolic activity in the lumbar spine at the level of the right L3-4 facet joint appears degenerative.  MRI abdomen 12/02/2017-focal area of abnormal signal in the caudate lobe of the liver-unclear etiology, dilation of the hepaticojejunostomy loop, bile ducts, and pancreatic duct-stricture formation versus recurrent tumor  8.  History of atrial fibrillation-maintained on xarelto  9. Family history of multiple cancers-negative CancerNext gene panel  10. Prolonged nausea following the pancreaticoduodenectomy.  Improved 10/26/2014.  11. Port-A-Cath placement 10/21/2014.  12. History of Neutropenia secondary to chemotherapy   13. Diarrhea. Question pancreatic insufficiency. Pancreatic enzyme replacement initiated 01/05/2015. Recurrent diarrhea following a course of antibiotics March 2017.  14. History of positional vertigo-resolved  15. 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 Neutropenia secondary to chemotherapy, G-CSF was added with cycle 2 gemcitabine/Abraxane  17. Delayed nausea and diarrhea following FOLFOX. Emend added with cycle 2. Decadron prophylaxis added with cycle 3  18. Diarrhea 10/28/2016. Question related to chemotherapy. Negative C. difficile testing 10/31/2016.  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  20.Admission 12/02/2017 with Bacteroides bacteremia  Biliary obstruction documented on CT/MRI 12/02/2017  Upper endoscopy 12/05/2017 revealed angulation of the apparent limb precluding intubation  Status post placement of a biliary drain 12/05/2017, replaced 12/09/2017  Bile duct brushings 12/09/2017-negative for malignancy  Biliary drain cholangiogram 12/18/2017-there is patency of the biliary-enteric anastomosis with no contrast traverses the afferentlimb into the jejunum  Jejunal stent placed 01/28/2018, brush biopsy negative for malignancy  Biliary drain removed 02/11/2018  21.Admission 12/29/2017 through 01/02/2018 with sepsis secondary to Klebsiella bacteremia/cholangitis. Cholangiogram 12/30/2017 showed high-grade stenosis/stricture of the proximal draining jejunal loops.Internal/external biliary drainage catheter  placed.  22.Right pelvicaliectasis on the PET scan 01/16/2018 with perinephric soft tissue fullness, the right ureter is not dilated 23. Admission 04/28/2018 with nausea/vomiting, increased abdominal pain, and a headache  Blood culture from 04/28/2018+ for Klebsiella pneumoniae, completed outpatient course of ampicillin-sulbactam  Port-A-Cath removed 05/02/2018 24. Anemia secondary to chronic disease, bleeding,and chemotherapy 25.  Fever, blood culture positive for staph epidermidis 06/24/2018- completed course of vancomycin    Disposition: [name] appears unchanged.  She will complete another cycle of gemcitabine/Abraxane today.  The CA 19-9 was lower when she was here 2 weeks ago.  She will undergo a restaging CT prior to an office visit in 3 weeks.  She completed a course of vancomycin for the positive blood culture.  She continues to have intermittent fever.  The fever occurs in the evening.  This may be a "tumor "fever.  It is possible the staph epidermidis was a contaminant.  She will contact us for a consistent fever.  We will check a blood culture from the PICC today.  25 minutes were spent with the patient today.  The majority of the time was used for counseling and coordination of care. Betsy Coder, MD  07/07/2018  9:38 AM

## 2018-07-08 ENCOUNTER — Inpatient Hospital Stay: Payer: Medicare Other

## 2018-07-08 ENCOUNTER — Other Ambulatory Visit: Payer: Self-pay | Admitting: Medical

## 2018-07-08 ENCOUNTER — Telehealth: Payer: Self-pay | Admitting: Emergency Medicine

## 2018-07-08 ENCOUNTER — Inpatient Hospital Stay (HOSPITAL_BASED_OUTPATIENT_CLINIC_OR_DEPARTMENT_OTHER): Payer: Medicare Other | Admitting: Medical

## 2018-07-08 VITALS — BP 92/61 | HR 51 | Temp 98.4°F | Resp 16

## 2018-07-08 VITALS — BP 115/80 | HR 55 | Temp 98.1°F | Resp 16

## 2018-07-08 DIAGNOSIS — R7881 Bacteremia: Secondary | ICD-10-CM | POA: Diagnosis not present

## 2018-07-08 DIAGNOSIS — Z5111 Encounter for antineoplastic chemotherapy: Secondary | ICD-10-CM | POA: Diagnosis not present

## 2018-07-08 DIAGNOSIS — C250 Malignant neoplasm of head of pancreas: Secondary | ICD-10-CM | POA: Diagnosis not present

## 2018-07-08 DIAGNOSIS — C259 Malignant neoplasm of pancreas, unspecified: Secondary | ICD-10-CM

## 2018-07-08 LAB — CANCER ANTIGEN 19-9: CAN 19-9: 500 U/mL — AB (ref 0–35)

## 2018-07-08 MED ORDER — PEGFILGRASTIM-CBQV 6 MG/0.6ML ~~LOC~~ SOSY
6.0000 mg | PREFILLED_SYRINGE | Freq: Once | SUBCUTANEOUS | Status: AC
  Administered 2018-07-08: 6 mg via SUBCUTANEOUS

## 2018-07-08 MED ORDER — VANCOMYCIN HCL 1000 MG IV SOLR
750.0000 mg | Freq: Once | INTRAVENOUS | Status: DC
  Filled 2018-07-08: qty 750

## 2018-07-08 MED ORDER — PEGFILGRASTIM-CBQV 6 MG/0.6ML ~~LOC~~ SOSY
PREFILLED_SYRINGE | SUBCUTANEOUS | Status: AC
  Filled 2018-07-08: qty 0.6

## 2018-07-08 MED ORDER — VANCOMYCIN HCL 1000 MG IV SOLR
750.0000 mg | Freq: Once | INTRAVENOUS | Status: AC
  Administered 2018-07-08: 750 mg via INTRAVENOUS
  Filled 2018-07-08: qty 750

## 2018-07-08 NOTE — Telephone Encounter (Signed)
Per Dr.Sherrill pt will need vancomycin x7days at home starting tomorrow. [facility]bal [facility]. She verbalized understanding of this.

## 2018-07-08 NOTE — Patient Instructions (Signed)
PICC Removal, Care After Refer to this sheet in the next few weeks. These instructions provide you with information on caring for yourself after your procedure. Your health care provider may also give you more specific instructions. Your treatment has been planned according to current medical practices, but problems sometimes occur. Call your health care provider if you have any problems or questions after your procedure. What can I expect after the procedure? After your procedure, it is typical to have mild discomfort at the insertion site. This should not last for more than a day. Follow these instructions at home: You may remove the bandage after 24 hours. The PICC insertion site is very small. A small scab may develop over the insertion site. It is okay to wash the site gently with soap and water. Be careful not to remove or pick off the scab. Gently pat the site dry after washing it. You do not need to put another bandage over the insertion site. Do not lift anything heavy or do strenuous physical activity for 24 hours after the PICC is removed. This includes:  Weight lifting.  Strenuous yard work.  Any physical activity with repetitive arm movement.  Contact a health care provider if:  You have swelling or puffiness in your arm at the PICC insertion site.  You have increasing tenderness at the PICC insertion site. Get help right away if:  You have numbness or tingling in your fingers, hand, or arm.  Your arm looks blue and feels cold.  You have redness around the insertion site or a red streak goes up your arm.  You have any type of drainage from the PICC insertion site. This includes drainage such as: ? Bleeding from the insertion site. If this happens, apply firm, direct pressure to the PICC insertion site with a clean towel. ? Drainage that is yellow or tan.  You have a fever. This information is not intended to replace advice given to you by your health care provider. Make  sure you discuss any questions you have with your health care provider. Document Released: 09/14/2013 Document Revised: 02/15/2016 Document Reviewed: 07/02/2013 [employer] Interactive Patient Education  2017 [employer]  
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
## 2018-07-09 LAB — BLOOD CULTURE ID PANEL (REFLEXED)
ACINETOBACTER BAUMANNII: NOT DETECTED
CANDIDA GLABRATA: NOT DETECTED
CANDIDA KRUSEI: NOT DETECTED
Candida albicans: NOT DETECTED
Candida parapsilosis: NOT DETECTED
Candida tropicalis: NOT DETECTED
Carbapenem resistance: NOT DETECTED
ENTEROBACTERIACEAE SPECIES: NOT DETECTED
Enterobacter cloacae complex: NOT DETECTED
Enterococcus species: NOT DETECTED
Escherichia coli: NOT DETECTED
HAEMOPHILUS INFLUENZAE: NOT DETECTED
KLEBSIELLA OXYTOCA: NOT DETECTED
Klebsiella pneumoniae: NOT DETECTED
Listeria monocytogenes: NOT DETECTED
METHICILLIN RESISTANCE: DETECTED — AB
NEISSERIA MENINGITIDIS: NOT DETECTED
Proteus species: NOT DETECTED
Pseudomonas aeruginosa: NOT DETECTED
SERRATIA MARCESCENS: NOT DETECTED
STREPTOCOCCUS PYOGENES: NOT DETECTED
Staphylococcus aureus (BCID): NOT DETECTED
Staphylococcus species: DETECTED — AB
Streptococcus agalactiae: NOT DETECTED
Streptococcus pneumoniae: NOT DETECTED
Streptococcus species: NOT DETECTED
Vancomycin resistance: NOT DETECTED

## 2018-07-10 LAB — CULTURE, BLOOD (SINGLE): SPECIAL REQUESTS: ADEQUATE

## 2018-07-13 ENCOUNTER — Encounter: Payer: Self-pay | Admitting: Medical

## 2018-07-13 ENCOUNTER — Encounter: Payer: Self-pay | Admitting: Oncology

## 2018-07-14 ENCOUNTER — Ambulatory Visit: Payer: Medicare Other | Admitting: Nurse Practitioner

## 2018-07-14 ENCOUNTER — Ambulatory Visit: Payer: Medicare Other

## 2018-07-14 ENCOUNTER — Other Ambulatory Visit: Payer: Medicare Other

## 2018-07-15 ENCOUNTER — Ambulatory Visit: Payer: Medicare Other

## 2018-07-20 ENCOUNTER — Telehealth: Payer: Self-pay | Admitting: Nurse Practitioner

## 2018-07-20 NOTE — Telephone Encounter (Signed)
Tried to reach regarding voicemail I did leave a message °

## 2018-07-26 ENCOUNTER — Other Ambulatory Visit: Payer: Self-pay | Admitting: Oncology

## 2018-07-27 ENCOUNTER — Other Ambulatory Visit: Payer: Medicare Other

## 2018-07-27 ENCOUNTER — Inpatient Hospital Stay: Payer: Medicare Other | Attending: Oncology

## 2018-07-27 ENCOUNTER — Ambulatory Visit (HOSPITAL_COMMUNITY)
Admission: RE | Admit: 2018-07-27 | Discharge: 2018-07-27 | Disposition: A | Discharge status: Home / Self Care | Payer: Medicare Other | Source: Ambulatory Visit | Attending: Oncology | Admitting: Oncology

## 2018-07-27 DIAGNOSIS — C250 Malignant neoplasm of head of pancreas: Secondary | ICD-10-CM | POA: Insufficient documentation

## 2018-07-27 DIAGNOSIS — Z9221 Personal history of antineoplastic chemotherapy: Secondary | ICD-10-CM | POA: Insufficient documentation

## 2018-07-27 DIAGNOSIS — R509 Fever, unspecified: Secondary | ICD-10-CM | POA: Diagnosis not present

## 2018-07-27 DIAGNOSIS — C259 Malignant neoplasm of pancreas, unspecified: Secondary | ICD-10-CM

## 2018-07-27 DIAGNOSIS — I700 Atherosclerosis of aorta: Secondary | ICD-10-CM | POA: Diagnosis not present

## 2018-07-27 DIAGNOSIS — D638 Anemia in other chronic diseases classified elsewhere: Secondary | ICD-10-CM | POA: Insufficient documentation

## 2018-07-27 DIAGNOSIS — N1330 Unspecified hydronephrosis: Secondary | ICD-10-CM | POA: Diagnosis not present

## 2018-07-27 DIAGNOSIS — Z7901 Long term (current) use of anticoagulants: Secondary | ICD-10-CM | POA: Diagnosis not present

## 2018-07-27 DIAGNOSIS — C541 Malignant neoplasm of endometrium: Secondary | ICD-10-CM | POA: Diagnosis not present

## 2018-07-27 DIAGNOSIS — I4891 Unspecified atrial fibrillation: Secondary | ICD-10-CM | POA: Insufficient documentation

## 2018-07-27 LAB — CBC WITH DIFFERENTIAL (CANCER CENTER ONLY)
ABS IMMATURE GRANULOCYTES: 0.08 10*3/uL — AB (ref 0.00–0.07)
BASOS ABS: 0 10*3/uL (ref 0.0–0.1)
Basophils Relative: 0 %
EOS PCT: 3 %
Eosinophils Absolute: 0.3 10*3/uL (ref 0.0–0.5)
HEMATOCRIT: 29.2 % — AB (ref 36.0–46.0)
Hemoglobin: 9.4 g/dL — ABNORMAL LOW (ref 12.0–15.0)
Immature Granulocytes: 1 %
LYMPHS ABS: 0.9 10*3/uL (ref 0.7–4.0)
LYMPHS PCT: 8 %
MCH: 32.4 pg (ref 26.0–34.0)
MCHC: 32.2 g/dL (ref 30.0–36.0)
MCV: 100.7 fL — ABNORMAL HIGH (ref 80.0–100.0)
MONOS PCT: 8 %
Monocytes Absolute: 0.8 10*3/uL (ref 0.1–1.0)
NRBC: 0 % (ref 0.0–0.2)
Neutro Abs: 8.3 10*3/uL — ABNORMAL HIGH (ref 1.7–7.7)
Neutrophils Relative %: 80 %
Platelet Count: 254 10*3/uL (ref 150–400)
RBC: 2.9 MIL/uL — ABNORMAL LOW (ref 3.87–5.11)
RDW: 17.7 % — ABNORMAL HIGH (ref 11.5–15.5)
WBC Count: 10.4 10*3/uL (ref 4.0–10.5)

## 2018-07-27 LAB — CMP (CANCER CENTER ONLY)
ALT: 27 U/L (ref 0–44)
AST: 51 U/L — AB (ref 15–41)
Albumin: 3.3 g/dL — ABNORMAL LOW (ref 3.5–5.0)
Alkaline Phosphatase: 278 U/L — ABNORMAL HIGH (ref 38–126)
Anion gap: 8 (ref 5–15)
BILIRUBIN TOTAL: 0.6 mg/dL (ref 0.3–1.2)
BUN: 20 mg/dL (ref 8–23)
CALCIUM: 8.9 mg/dL (ref 8.9–10.3)
CO2: 24 mmol/L (ref 22–32)
CREATININE: 1.01 mg/dL — AB (ref 0.44–1.00)
Chloride: 103 mmol/L (ref 98–111)
GFR, Estimated: 55 mL/min — ABNORMAL LOW (ref >60)
Glucose, Bld: 92 mg/dL (ref 70–99)
Potassium: 4.6 mmol/L (ref 3.5–5.1)
Sodium: 135 mmol/L (ref 135–145)
TOTAL PROTEIN: 6.9 g/dL (ref 6.5–8.1)

## 2018-07-27 MED ORDER — IOPAMIDOL (ISOVUE-300) INJECTION 61%
100.0000 mL | Freq: Once | INTRAVENOUS | Status: AC | PRN
  Administered 2018-07-27: 100 mL via INTRAVENOUS

## 2018-07-27 MED ORDER — SODIUM CHLORIDE 0.9 % IJ SOLN
INTRAMUSCULAR | Status: AC
  Filled 2018-07-27: qty 50

## 2018-07-28 ENCOUNTER — Telehealth: Payer: Self-pay | Admitting: Oncology

## 2018-07-28 ENCOUNTER — Encounter: Payer: Self-pay | Admitting: Nurse Practitioner

## 2018-07-28 ENCOUNTER — Inpatient Hospital Stay: Payer: Medicare Other

## 2018-07-28 ENCOUNTER — Inpatient Hospital Stay (HOSPITAL_BASED_OUTPATIENT_CLINIC_OR_DEPARTMENT_OTHER): Payer: Medicare Other | Admitting: Nurse Practitioner

## 2018-07-28 ENCOUNTER — Telehealth: Payer: Self-pay | Admitting: *Deleted

## 2018-07-28 VITALS — BP 111/69 | HR 61 | Temp 98.1°F | Resp 17 | Ht 66.0 in | Wt 121.0 lb

## 2018-07-28 DIAGNOSIS — I4891 Unspecified atrial fibrillation: Secondary | ICD-10-CM

## 2018-07-28 DIAGNOSIS — D638 Anemia in other chronic diseases classified elsewhere: Secondary | ICD-10-CM

## 2018-07-28 DIAGNOSIS — C541 Malignant neoplasm of endometrium: Secondary | ICD-10-CM | POA: Diagnosis not present

## 2018-07-28 DIAGNOSIS — C250 Malignant neoplasm of head of pancreas: Secondary | ICD-10-CM

## 2018-07-28 DIAGNOSIS — Z9221 Personal history of antineoplastic chemotherapy: Secondary | ICD-10-CM

## 2018-07-28 DIAGNOSIS — R509 Fever, unspecified: Secondary | ICD-10-CM | POA: Diagnosis not present

## 2018-07-28 DIAGNOSIS — Z7901 Long term (current) use of anticoagulants: Secondary | ICD-10-CM

## 2018-07-28 LAB — URINALYSIS, COMPLETE (UACMP) WITH MICROSCOPIC
Bacteria, UA: NONE SEEN
Bilirubin Urine: NEGATIVE
GLUCOSE, UA: NEGATIVE mg/dL
Hgb urine dipstick: NEGATIVE
KETONES UR: NEGATIVE mg/dL
Leukocytes, UA: NEGATIVE
Nitrite: NEGATIVE
PH: 5 (ref 5.0–8.0)
Protein, ur: NEGATIVE mg/dL
SPECIFIC GRAVITY, URINE: 1.021 (ref 1.005–1.030)

## 2018-07-28 LAB — CANCER ANTIGEN 19-9: CA 19-9: 632 U/mL — ABNORMAL HIGH (ref 0–35)

## 2018-07-28 MED ORDER — MORPHINE SULFATE 15 MG PO TABS: 15.0000 mg | ORAL_TABLET | ORAL | 0 refills | Status: DC | PRN

## 2018-07-28 NOTE — Telephone Encounter (Signed)
Home Health RN left VM on 11/4 asking if they need to continue her PICC care/antibiotic administration. Is due to renew services. Left VM that she no longer needs home care. No plans for new PICC

## 2018-07-28 NOTE — Telephone Encounter (Signed)
Appts scheduled avs/calendar printed/ referral place through [place] per 11/5 los

## 2018-07-28 NOTE — Progress Notes (Addendum)
[facility] OFFICE PROGRESS NOTE   Diagnosis: Pancreas cancer  INTERVAL HISTORY:   [name] returns as scheduled.  She had a fever to 101.7 last night with associated shaking chills.  No significant cough.  No shortness of breath.  Pain is controlled with morphine.  She describes her appetite as "okay".  Objective:  Vital signs in last 24 hours:  Blood pressure 111/69, pulse 61, temperature 98.1 F (36.7 C), temperature source Oral, resp. rate 17, height 5\' 6"  (1.676 m), weight 121 lb (54.9 kg), SpO2 99 %.    HEENT: No thrush or ulcers. Resp: Lungs clear bilaterally. Cardio: Regular rate and rhythm. GI: Abdomen with mild diffuse tenderness.  No mass.  No hepatomegaly. Vascular: No leg edema.  Skin: No rash.   Lab Results:  Lab Results  Component Value Date   WBC 10.4 07/27/2018   HGB 9.4 (L) 07/27/2018   HCT 29.2 (L) 07/27/2018   MCV 100.7 (H) 07/27/2018   PLT 254 07/27/2018   NEUTROABS 8.3 (H) 07/27/2018    Imaging:  Ct Abdomen Pelvis W Contrast  Result Date: 07/27/2018 CLINICAL DATA:  Pancreatic cancer with chemotherapy including 3 weeks ago. EXAM: CT ABDOMEN AND PELVIS WITH CONTRAST TECHNIQUE: Multidetector CT imaging of the abdomen and pelvis was performed using the standard protocol following bolus administration of intravenous contrast. CONTRAST:  122mL ISOVUE-300 IOPAMIDOL (ISOVUE-300) INJECTION 61% COMPARISON:  04/26/2018 FINDINGS: Lower chest: Clear lung bases. Normal heart size without pericardial or pleural effusion. Hepatobiliary: Heterogeneous arterial hepatic enhancement is nonspecific in the setting of biliary obstruction. No suspicious liver lesions on portal venous phase imaging. Minimal pneumobilia in the left hepatic lobe. Moderate intrahepatic biliary duct dilatation, with an index right hepatic duct measuring 8 mm on image 34/7, similar. The common duct is narrowed in the region of the porta hepatis, similar. Cholecystectomy. Pancreas:  Status post Whipple procedure. Spleen: Normal in size, without focal abnormality. Adrenals/Urinary Tract: Normal adrenal glands. Normal left kidney. Moderate right-sided hydronephrosis and decreased renal function is similar to 04/26/2018, secondary to infiltrative retroperitoneal tumor detailed below. Normal urinary bladder. Stomach/Bowel: Gastrojejunostomy. Colonic stool burden suggests constipation. Normal terminal ileum. A stent within proximal abdominal small bowel is unchanged in position, without acute complication. Vascular/Lymphatic: Aortic atherosclerosis.  Patent portal vein. Tumor within the retroperitoneum at the level of the SMA origin measures on the order of 3.0 cm on image 36/7 versus 2.9 cm on 04/26/2018 (when remeasured). Right retroperitoneal tumor of the level of the interpolar right kidney measures on the order of 4.0 x 4.2 cm on image 56/7. Compare 4.0 x 3.7 cm on the prior exam (when remeasured). Extension anterior to the right common femoral vein and artery measures 1.5 cm on image 70/7 versus 1.4 cm on the prior exam (when remeasured). Reproductive: Hysterectomy.  No adnexal mass. Other: No significant free fluid. Musculoskeletal: Degenerate disc disease at the lumbosacral junction. Superior endplate compression deformities at L5 and T11 are mild and similar. Convex left lumbar spine curvature. IMPRESSION: 1. Mild progression of retroperitoneal infiltrative tumor compared 04/26/2018. 2. Similar moderate biliary duct dilatation, secondary to presumed tumor obstruction in the porta hepatis. 3. Similar moderate right-sided hydronephrosis with delayed contrast excretion/renal function. 4.  Aortic Atherosclerosis (ICD10-I70.0). 5.  Possible constipation. Electronically Signed   By: Abigail Miyamoto M.D.   On: 07/27/2018 09:45    Medications: I have reviewed the patient's current medications.  Assessment/Plan: 1. 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  Gemcitabine held 11/02/2014 due to neutropenia.  Gemcitabine 11/09/2014 dose reduced 800 mg/m.  Gemcitabine held 11/16/2014 due to neutropenia.  Gemcitabine resumed 11/23/2014 every 2 week schedule.  Cycle 6 gemcitabine 01/05/2015  Cycle 7 gemcitabine 01/18/2015  Cycle 8 gemcitabine 02/01/2015  Cycle 9 gemcitabine 02/15/2015  Cycle 10 gemcitabine 03/01/2015  Cycle 11 gemcitabine 03/15/2015  Cycle 12 gemcitabine 03/29/2015  Elevated CA 19-01 May 2016  CTs 06/17/2016-new soft tissue mass at the root of the mesentery with vascular involvement  PET 06/27/2016-hypermetabolic activity associated with soft tissue adjacent to surgical clips in the central mesentery  Status post SBRTto the mesenteric mass completed 07/26/2016  CT abdomen/pelvis 08/23/2016 -mesenteric mass stable to slightly decreased in size.  Cycle 1 FOLFOX 09/03/2016  Cycle 2 FOLFOX 09/30/2016  Cycle 3 FOLFOX 10/14/2016   Cycle 4 FOLFOX 11/05/2016 (5-FU bolus eliminated and 5-FU pump dose reduced)  Cycle 5 FOLFOX 11/19/2016  CT abdomen/pelvis 11/28/2016-decreased size of soft tissue at the small bowel mesentery, mild asymmetric soft tissue at the left vaginal cuff  Cycle 6 FOLFOX 12/10/2016 (5-FU infusion further reduced and oxaliplatin reduced)  Cycle 7 FOLFOX 12/31/2016  Cycle 8 FOLFOX 01/21/2017   CT 02/24/2017-slight decrease in size of the mesenteric mass, resolution of soft tissue fullness at the left vaginal cuff, no evidence of disease progression  CT abdomen/pelvis 06/20/2017-stable ill-defined soft tissue at the mesenteric root, mild increased asymmetry at the left vaginal cuff, no other  evidence of disease progression  Elevated CA 19-9 09/11/2018  CT abdomen/pelvis 11/18/2017-no evidence of recurrent pancreas cancer, dilated afferentloop, intrahepatic biliary dilatation  PET scan 01/16/2018-low level hypermetabolic activity in the porta hepatis which appears to correspond with a mildly enlarged lymph node on MRI. No other suspicious nodal activity in the abdomen. Small right paratracheal and subcarinal lymph nodes with low-level hypermetabolic activity in the chest. No abnormal activity within the liver. Findings suspicious for obstruction at the right ureteropelvic junction. Asymmetric left oropharyngeal activity.  CT abdomen/pelvis 03/31/2018-significant increaseinright-sided retroperitoneal soft tissue which invades and encases the IVC, celiac trunk, SMA, duodenum and right renal pelvis. Interval progression of intrahepatic and common bile duct dilatation. New right-sided obstructive uropathy secondary to tumor invasion of the right renal pelvis.  Cycle 1 gemcitabine/Abraxane 04/08/2018  Cycle 2 gemcitabine/Abraxane 04/22/2018   CT 04/26/2018-slight decrease in tumor encasing the duodenum, IVC, right renal pelvis, and celiac with slight decrease in hydroureteronephrosis  Cycle 3 gemcitabine/Abraxane 05/12/2018  Cycle 4 gemcitabine/Abraxane 05/26/2018  Cycle 5 gemcitabine/Abraxane 06/09/2018  Cycle 6 gemcitabine/Abraxane 06/23/2018  Cycle 7 gemcitabine/Abraxane 07/07/2018  Restaging CTs 07/27/2018- mild progression of retroperitoneal infiltrative tumor   2. bile duct obstruction secondary to #1, status post an ERCP with stent placement 09/23/2015hypermetabolic soft tissue in the central mesentery, no other evidence of metastatic disease, stable mildly enlarged portal caval node  3. Admission with post ERCP pancreatitis 06/16/2014  4. History of abdominal pain secondary to #1  5. Pulmonary embolism diagnosed on a CT of the abdomen  09/16/2014  Negative lower extremity Dopplers 09/17/2014  6. Multiple orthopedic surgical procedures  7. Endometrial cancer,stage IA, grade 1 endometrioid adenocarcinoma, 18% myometrial invasion, no lymphovascular space involvement, negative washings  Status post robotic total hysterectomy and bilateral salpingo-oophorectomy 11/30/2010  Recurrent tumor left lateral vagina status post biopsy 11/24/2014 with pathology  confirming adenocarcinoma with focal squamous differentiation consistent with endometrial adenocarcinoma  Staging CT scans 12/06/2014 with no evidence of local pancreatic cancer recurrence. Small fluid collection adjacent to the left adrenal gland. Severe hepatic steatosis. No evidence of local extension of endometrial carcinoma. Carcinoma not well-defined at the vaginal cuff. 5 mm right external iliac lymph node. 3.6 mm left external iliac lymph node  Brachytherapy initiated 12/22/2014, completed 01/19/2015  CT abdomen/pelvis 07/24/2015 revealed a 3 x 4 cm soft tissue focus at the vaginal apex  PET scan 08/11/2015 revealed no mass at the vaginal apex and no evidence of metastatic disease  CT 11/28/2016-mild asymmetric soft tissue at the left vaginal cuff, resolved on CT 02/24/2017  CT abdomen/pelvis 06/20/2017-stable mild ill-defined soft tissue density in the mesenteric root. Stable mild portacaval lymphadenopathy and subcentimeter right retroperitoneal lymph nodes. Mild increased size of asymmetric soft tissue density involving the left vaginal cuff.  PET scan 07/19/2017-no suspicious hypermetabolic activity within the neck, chest, abdomen or pelvis. Specifically no evidence of residual hypermetabolic tumor in the surgical bed or at the vaginal cuff. Hypermetabolic activity in the lumbar spine at the level of the right L3-4 facet joint appears degenerative.  MRI abdomen 12/02/2017-focal area of abnormal signal in the caudate lobe of the liver-unclear etiology, dilation  of the hepaticojejunostomy loop, bile ducts, and pancreatic duct-stricture formation versus recurrent tumor  8. History of atrial fibrillation-maintained on xarelto  9. Family history of multiple cancers-negative CancerNext gene panel  10. Prolonged nausea following the pancreaticoduodenectomy. Improved 10/26/2014.  11. Port-A-Cath placement 10/21/2014.  12. History of Neutropenia secondary to chemotherapy   13. Diarrhea. Question pancreatic insufficiency. Pancreatic enzyme replacement initiated 01/05/2015. Recurrent diarrhea following a course of antibiotics March 2017.  14. History of positional vertigo-resolved  15. 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 Neutropenia secondary to chemotherapy, G-CSF was added with cycle 2 gemcitabine/Abraxane  17. Delayed nausea and diarrhea following FOLFOX. Emend added with cycle 2. Decadron prophylaxis added with cycle 3  18. Diarrhea 10/28/2016. Question related to chemotherapy. Negative C. difficile testing 10/31/2016.  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  20.Admission 12/02/2017 with Bacteroides bacteremia  Biliary obstruction documented on CT/MRI 12/02/2017  Upper endoscopy 12/05/2017 revealed angulation of the apparent limb precluding intubation  Status post placement of a biliary drain 12/05/2017, replaced 12/09/2017  Bile duct brushings 12/09/2017-negative for malignancy  Biliary drain cholangiogram 12/18/2017-there is patency of the biliary-enteric anastomosis with no contrast traverses the afferentlimb into the jejunum  Jejunal stent placed 01/28/2018, brush biopsy negative for malignancy  Biliary drain removed 02/11/2018  21.Admission 12/29/2017 through 01/02/2018 with sepsis secondary to Klebsiella bacteremia/cholangitis. Cholangiogram 12/30/2017 showed  high-grade stenosis/stricture of the proximal draining jejunal loops.Internal/external biliary drainage catheter placed.  22.Right pelvicaliectasis on the PET scan 01/16/2018 with perinephric soft tissue fullness, the right ureter is not dilated 23. Admission 04/28/2018 with nausea/vomiting, increased abdominal pain, and a headache  Blood culture from 04/28/2018+ for Klebsiella pneumoniae, completed outpatient course of ampicillin-sulbactam  Port-A-Cath removed 05/02/2018 24. Anemia secondary to chronic disease, bleeding,and chemotherapy 25.  Fever, blood culture positive for staph epidermidis 06/24/2018- completed course of vancomycin; repeat blood culture on 07/07/2018 positive for Staphylococcus, methicillin-resistant.  She completed a course of vancomycin.  PICC removed 07/08/2018   Disposition: Ms. Levier appears unchanged.  She has completed 7 cycles of gemcitabine/Abraxane.  Recent restaging CTs show minimal progression of retroperitoneal tumor.  She has had 2 positive  blood cultures over the past month.  She recently completed a second course of vancomycin.  She continues to have intermittent fever/shaking chills.  She will return to the lab today for blood and urine cultures.  We made a referral to Dr. Drucilla Schmidt, infectious disease.  Further chemotherapy is on hold pending evaluation by Dr. Drucilla Schmidt.  She will return for a follow-up visit in approximately 2 weeks.  She will contact the office in the interim with any problems.  Patient seen with Dr. [name].  CT images reviewed on the computer with [name] and her family.  25 minutes were spent face-to-face at today's visit with the majority of that time involved in counseling/coordination of care.    Ned Card ANP/GNP-BC   07/28/2018  12:18 PM  This was a shared visit with Ned Card.  [name] continues to have intermittent fever and chills despite removal of the Port-A-Cath and PICC. We obtained a repeat blood culture and  urine culture today.  She will be referred to the infectious disease clinic.  The restaging CT reveals a slight increase in the mass near the right kidney.  We reviewed the CT images and discussed treatment options with [name] and her family.  She will be placed on a treatment break from chemotherapy while the fever is further evaluated.  Julieanne Manson, MD

## 2018-07-29 LAB — URINE CULTURE

## 2018-07-29 NOTE — Telephone Encounter (Signed)
Error opening  

## 2018-07-30 ENCOUNTER — Telehealth: Payer: Self-pay | Admitting: *Deleted

## 2018-07-30 NOTE — Telephone Encounter (Signed)
Patient's PCP Roe Coombs called to see if the referral to [facility] could be moved up. Patient had blood work drawn at the cancer center, results not yet final. OK per Dr Megan Salon to schedule patient for Monday 11/11 at 1:15.  Patient aware, accepted appointment.  Landis Gandy, RN

## 2018-08-02 LAB — CULTURE, BLOOD (SINGLE): CULTURE: NO GROWTH

## 2018-08-03 ENCOUNTER — Ambulatory Visit: Payer: Medicare Other | Admitting: Internal Medicine

## 2018-08-03 ENCOUNTER — Encounter: Payer: Self-pay | Admitting: Internal Medicine

## 2018-08-03 DIAGNOSIS — R509 Fever, unspecified: Secondary | ICD-10-CM | POA: Diagnosis not present

## 2018-08-03 NOTE — Assessment & Plan Note (Signed)
I do not find any evidence of active infection to explain her recent intermittent fevers.  She clearly had Klebsiella bacteremia April and in August.  I am not sure if she actually had true coag negative staph bacteremia in October.  The first positive blood culture was drawn through her PICC and may have been a simple contaminant.  On 07/07/2018 only one set of blood cultures was obtained.  No site was documented for those blood cultures.  That positive blood culture may also reflect a contaminant rather than true bacteremia.  The fact that her recent fevers seem to come and go without antibiotic therapy suggests that they may not be related to infection.  It is possible that it may be related to her adenocarcinoma.  I asked her to let me know if she continues to have fever.  If so I would be happy to reevaluate her at that time.
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
## 2018-08-04 ENCOUNTER — Ambulatory Visit: Payer: Medicare Other | Admitting: Infectious Diseases

## 2018-08-06 ENCOUNTER — Ambulatory Visit
Admission: RE | Admit: 2018-08-06 | Discharge: 2018-08-06 | Disposition: A | Discharge status: Home / Self Care | Payer: Medicare Other | Source: Ambulatory Visit | Attending: Family Medicine | Admitting: Family Medicine

## 2018-08-06 DIAGNOSIS — Z1231 Encounter for screening mammogram for malignant neoplasm of breast: Secondary | ICD-10-CM

## 2018-08-09 ENCOUNTER — Encounter: Payer: Self-pay | Admitting: Nurse Practitioner

## 2018-08-09 ENCOUNTER — Other Ambulatory Visit: Payer: Self-pay | Admitting: Oncology

## 2018-08-10 ENCOUNTER — Other Ambulatory Visit: Payer: Self-pay | Admitting: Emergency Medicine

## 2018-08-10 DIAGNOSIS — C250 Malignant neoplasm of head of pancreas: Secondary | ICD-10-CM

## 2018-08-11 ENCOUNTER — Inpatient Hospital Stay: Payer: Medicare Other

## 2018-08-11 ENCOUNTER — Encounter: Payer: Self-pay | Admitting: Nurse Practitioner

## 2018-08-11 ENCOUNTER — Inpatient Hospital Stay (HOSPITAL_BASED_OUTPATIENT_CLINIC_OR_DEPARTMENT_OTHER): Payer: Medicare Other | Admitting: Nurse Practitioner

## 2018-08-11 ENCOUNTER — Telehealth: Payer: Self-pay

## 2018-08-11 VITALS — BP 124/68 | HR 54 | Temp 97.8°F | Resp 17 | Ht 66.0 in | Wt 119.9 lb

## 2018-08-11 DIAGNOSIS — Z7901 Long term (current) use of anticoagulants: Secondary | ICD-10-CM

## 2018-08-11 DIAGNOSIS — C250 Malignant neoplasm of head of pancreas: Secondary | ICD-10-CM

## 2018-08-11 DIAGNOSIS — I4891 Unspecified atrial fibrillation: Secondary | ICD-10-CM | POA: Diagnosis not present

## 2018-08-11 DIAGNOSIS — D6481 Anemia due to antineoplastic chemotherapy: Secondary | ICD-10-CM | POA: Diagnosis not present

## 2018-08-11 NOTE — Telephone Encounter (Signed)
Printed avs and calender of upcoming appointment. Per 11/19 los 

## 2018-08-11 NOTE — Progress Notes (Addendum)
[facility] OFFICE PROGRESS NOTE   Diagnosis: Pancreas cancer  INTERVAL HISTORY:   [name] returns as scheduled.  She saw Dr. Megan Salon, infectious disease 08/03/2018 with the recommendation to observe off of antibiotics for now.  She denies further fever or chills.  She has increased back and right side abdominal pain.  She is taking morphine about times a day.  She has constipation.  She is taking MiraLAX daily and a stool softener twice a day.  No nausea or vomiting.  Objective:  Vital signs in last 24 hours:  Blood pressure 124/68, pulse (!) 54, temperature 97.8 F (36.6 C), temperature source Oral, resp. rate 17, height 5\' 6"  (1.676 m), weight 119 lb 14.4 oz (54.4 kg), SpO2 100 %.    HEENT: No thrush or ulcers. Resp: Lungs clear bilaterally. Cardio: Regular rate and rhythm. GI: Abdomen soft.  Tender over the right abdomen. Vascular: No leg edema.   Lab Results:  Lab Results  Component Value Date   WBC 10.4 07/27/2018   HGB 9.4 (L) 07/27/2018   HCT 29.2 (L) 07/27/2018   MCV 100.7 (H) 07/27/2018   PLT 254 07/27/2018   NEUTROABS 8.3 (H) 07/27/2018    Imaging:  No results found.  Medications: I have reviewed the patient's current medications.  Assessment/Plan: 1. 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  Gemcitabine held 11/02/2014 due to neutropenia.  Gemcitabine 11/09/2014 dose reduced 800 mg/m.  Gemcitabine held 11/16/2014 due to neutropenia.  Gemcitabine resumed 11/23/2014 every 2 week schedule.  Cycle 6 gemcitabine 01/05/2015  Cycle 7 gemcitabine 01/18/2015  Cycle 8 gemcitabine 02/01/2015  Cycle  9 gemcitabine 02/15/2015  Cycle 10 gemcitabine 03/01/2015  Cycle 11 gemcitabine 03/15/2015  Cycle 12 gemcitabine 03/29/2015  Elevated CA 19-01 May 2016  CTs 06/17/2016-new soft tissue mass at the root of the mesentery with vascular involvement  PET 06/27/2016-hypermetabolic activity associated with soft tissue adjacent to surgical clips in the central mesentery  Status post SBRTto the mesenteric mass completed 07/26/2016  CT abdomen/pelvis 08/23/2016 -mesenteric mass stable to slightly decreased in size.  Cycle 1 FOLFOX 09/03/2016  Cycle 2 FOLFOX 09/30/2016  Cycle 3 FOLFOX 10/14/2016   Cycle 4 FOLFOX 11/05/2016 (5-FU bolus eliminated and 5-FU pump dose reduced)  Cycle 5 FOLFOX 11/19/2016  CT abdomen/pelvis 11/28/2016-decreased size of soft tissue at the small bowel mesentery, mild asymmetric soft tissue at the left vaginal cuff  Cycle 6 FOLFOX 12/10/2016 (5-FU infusion further reduced and oxaliplatin reduced)  Cycle 7 FOLFOX 12/31/2016  Cycle 8 FOLFOX 01/21/2017   CT 02/24/2017-slight decrease in size of the mesenteric mass, resolution of soft tissue fullness at the left vaginal cuff, no evidence of disease progression  CT abdomen/pelvis 06/20/2017-stable ill-defined soft tissue at the mesenteric root, mild increased asymmetry at the left vaginal cuff, no other evidence of disease progression  Elevated CA 19-9 09/11/2018  CT abdomen/pelvis 11/18/2017-no evidence of recurrent pancreas cancer, dilated afferentloop, intrahepatic biliary dilatation  PET scan 01/16/2018-low level hypermetabolic activity in the porta hepatis which appears to correspond with a mildly enlarged lymph node on MRI. No other suspicious nodal activity in the abdomen. Small right paratracheal and subcarinal lymph nodes with low-level hypermetabolic activity in the chest. No abnormal activity within the liver. Findings  suspicious for obstruction at the right ureteropelvic junction. Asymmetric  left oropharyngeal activity.  CT abdomen/pelvis 03/31/2018-significant increaseinright-sided retroperitoneal soft tissue which invades and encases the IVC, celiac trunk, SMA, duodenum and right renal pelvis. Interval progression of intrahepatic and common bile duct dilatation. New right-sided obstructive uropathy secondary to tumor invasion of the right renal pelvis.  Cycle 1 gemcitabine/Abraxane 04/08/2018  Cycle 2 gemcitabine/Abraxane 04/22/2018   CT 04/26/2018-slight decrease in tumor encasing the duodenum, IVC, right renal pelvis, and celiac with slight decrease in hydroureteronephrosis  Cycle 3 gemcitabine/Abraxane 05/12/2018  Cycle 4 gemcitabine/Abraxane 05/26/2018  Cycle 5 gemcitabine/Abraxane 06/09/2018  Cycle 6 gemcitabine/Abraxane 06/23/2018  Cycle 7 gemcitabine/Abraxane 07/07/2018  Restaging CTs 07/27/2018- mild progression of retroperitoneal infiltrative tumor   2. bile duct obstruction secondary to #1, status post an ERCP with stent placement 09/23/2015hypermetabolic soft tissue in the central mesentery, no other evidence of metastatic disease, stable mildly enlarged portal caval node  3. Admission with post ERCP pancreatitis 06/16/2014  4. History of abdominal pain secondary to #1  5. Pulmonary embolism diagnosed on a CT of the abdomen 09/16/2014  Negative lower extremity Dopplers 09/17/2014  6. Multiple orthopedic surgical procedures  7. Endometrial cancer,stage IA, grade 1 endometrioid adenocarcinoma, 18% myometrial invasion, no lymphovascular space involvement, negative washings  Status post robotic total hysterectomy and bilateral salpingo-oophorectomy 11/30/2010  Recurrent tumor left lateral vagina status post biopsy 11/24/2014 with pathology confirming adenocarcinoma with focal squamous differentiation consistent with endometrial adenocarcinoma  Staging CT scans 12/06/2014 with no evidence of local pancreatic cancer recurrence. Small fluid  collection adjacent to the left adrenal gland. Severe hepatic steatosis. No evidence of local extension of endometrial carcinoma. Carcinoma not well-defined at the vaginal cuff. 5 mm right external iliac lymph node. 3.6 mm left external iliac lymph node  Brachytherapy initiated 12/22/2014, completed 01/19/2015  CT abdomen/pelvis 07/24/2015 revealed a 3 x 4 cm soft tissue focus at the vaginal apex  PET scan 08/11/2015 revealed no mass at the vaginal apex and no evidence of metastatic disease  CT 11/28/2016-mild asymmetric soft tissue at the left vaginal cuff, resolved on CT 02/24/2017  CT abdomen/pelvis 06/20/2017-stable mild ill-defined soft tissue density in the mesenteric root. Stable mild portacaval lymphadenopathy and subcentimeter right retroperitoneal lymph nodes. Mild increased size of asymmetric soft tissue density involving the left vaginal cuff.  PET scan 07/19/2017-no suspicious hypermetabolic activity within the neck, chest, abdomen or pelvis. Specifically no evidence of residual hypermetabolic tumor in the surgical bed or at the vaginal cuff. Hypermetabolic activity in the lumbar spine at the level of the right L3-4 facet joint appears degenerative.  MRI abdomen 12/02/2017-focal area of abnormal signal in the caudate lobe of the liver-unclear etiology, dilation of the hepaticojejunostomy loop, bile ducts, and pancreatic duct-stricture formation versus recurrent tumor  8. History of atrial fibrillation-maintained on xarelto  9. Family history of multiple cancers-negative CancerNext gene panel  10. Prolonged nausea following the pancreaticoduodenectomy. Improved 10/26/2014.  11. Port-A-Cath placement 10/21/2014.  12. History of Neutropenia secondary to chemotherapy   13. Diarrhea. Question pancreatic insufficiency. Pancreatic enzyme replacement initiated 01/05/2015. Recurrent diarrhea following a course of antibiotics March 2017.  14. History of positional  vertigo-resolved  15. 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 Neutropenia secondary to chemotherapy, G-CSF was added with cycle 2 gemcitabine/Abraxane  17. Delayed nausea and diarrhea following FOLFOX. Emend added with cycle 2. Decadron prophylaxis added with cycle 3  18. Diarrhea  10/28/2016. Question related to chemotherapy. Negative C. difficile testing 10/31/2016.  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  20.Admission 12/02/2017 with Bacteroides bacteremia  Biliary obstruction documented on CT/MRI 12/02/2017  Upper endoscopy 12/05/2017 revealed angulation of the apparent limb precluding intubation  Status post placement of a biliary drain 12/05/2017, replaced 12/09/2017  Bile duct brushings 12/09/2017-negative for malignancy  Biliary drain cholangiogram 12/18/2017-there is patency of the biliary-enteric anastomosis with no contrast traverses the afferentlimb into the jejunum  Jejunal stent placed 01/28/2018, brush biopsy negative for malignancy  Biliary drain removed 02/11/2018  21.Admission 12/29/2017 through 01/02/2018 with sepsis secondary to Klebsiella bacteremia/cholangitis. 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  22.Right pelvicaliectasis on the PET scan 01/16/2018 with perinephric soft tissue fullness, the right ureter is not dilated 23. Admission 04/28/2018 with nausea/vomiting, increased abdominal pain, and a headache  Blood culture from 04/28/2018+ for Klebsiella pneumoniae, completed outpatient course of ampicillin-sulbactam  Port-A-Cath removed 05/02/2018 24. Anemia secondary to chronic disease, bleeding,and chemotherapy 25.Fever, blood culture positive for staph epidermidis 06/24/2018-  completed course of vancomycin; repeat blood culture on 07/07/2018 positive for Staphylococcus, methicillin-resistant.  She completed a course of vancomycin.  PICC removed 07/08/2018  Disposition: [name] appears unchanged.  She continues to have abdominal and back pain.  She will continue morphine as needed which is providing fairly good relief.  We discussed treatment options including FOLFIRINOX, re-treatment with FOLFOX, FOLFIRI.  She does not appear to be a good candidate for FOLFIRINOX.  Dr. [name] recommends FOLFOX with the understanding that her neuropathy could get worse.  FOLFIRI would be the next option if she is unable to tolerate FOLFOX.  We reviewed potential toxicities associated with Irinotecan including bone marrow toxicity, hair loss, nausea, diarrhea both acute and late phase.  We will follow-up on the CA-19-9 tumor marker from today.  She will return in 2 weeks for further discussion.  She understands placement of a Port-A-Cath is necessary for this regimen.  Patient seen with Dr. [name].  25 minutes were spent face-to-face at today's visit with the majority of that time involved in counseling/coordination of care.   Ned Card ANP/GNP-BC   08/11/2018  10:10 AM  This was a shared visit with Ned Card.  [name] appears stable.  We will follow-up on the CA 19-9 from today.  We discussed treatment options with [name].  This includes supportive care versus a trial of salvage systemic therapy.  She would like to consider these options and return for further discussion in 2 weeks.  We discussed potential toxicities associated with the FOLFOX and FOLFIRI regimens.  Julieanne Manson, MD

## 2018-08-12 LAB — CANCER ANTIGEN 19-9: CAN 19-9: 628 U/mL — AB (ref 0–35)

## 2018-08-13 ENCOUNTER — Telehealth: Payer: Self-pay | Admitting: *Deleted

## 2018-08-13 MED ORDER — SENNOSIDES-DOCUSATE SODIUM 8.6-50 MG PO TABS: 2.0000 | ORAL_TABLET | Freq: Two times a day (BID) | ORAL | Status: DC

## 2018-08-13 MED ORDER — MORPHINE SULFATE ER 30 MG PO TBCR: 30.0000 mg | EXTENDED_RELEASE_TABLET | Freq: Two times a day (BID) | ORAL | 0 refills | Status: DC

## 2018-08-13 NOTE — Telephone Encounter (Signed)
Reports msir 15 mg ever 4 hours is not controlling her pain. She is waking up at night now in pain. Bowels moving, urine clear. Per Dr. [name] : may take msir 30 mg every 4 hours prn and will add MS Contin 30 mg every 12 hours. Explained to patient how this will work with her msir. Encouraged her to be aggressive with her bowel regimen. Suggested MiraLax daily with Senna-S #2 tabs twice daily. She understands and agrees.

## 2018-08-25 ENCOUNTER — Other Ambulatory Visit: Payer: Self-pay | Admitting: *Deleted

## 2018-08-25 ENCOUNTER — Encounter (HOSPITAL_COMMUNITY): Payer: Self-pay | Admitting: *Deleted

## 2018-08-25 ENCOUNTER — Ambulatory Visit (HOSPITAL_COMMUNITY)
Admission: RE | Admit: 2018-08-25 | Discharge: 2018-08-25 | Disposition: A | Discharge status: Home / Self Care | Payer: Medicare Other | Source: Ambulatory Visit | Attending: Oncology | Admitting: Oncology

## 2018-08-25 ENCOUNTER — Inpatient Hospital Stay (HOSPITAL_COMMUNITY)
Admission: AD | Admit: 2018-08-25 | Discharge: 2018-08-28 | DRG: 660 | Disposition: A | Discharge status: Home / Self Care | Payer: Medicare Other | Source: Ambulatory Visit | Attending: Internal Medicine | Admitting: Internal Medicine

## 2018-08-25 ENCOUNTER — Inpatient Hospital Stay: Payer: Medicare Other

## 2018-08-25 ENCOUNTER — Other Ambulatory Visit: Payer: Self-pay

## 2018-08-25 ENCOUNTER — Inpatient Hospital Stay: Payer: Medicare Other | Attending: Oncology | Admitting: Oncology

## 2018-08-25 VITALS — BP 115/72 | HR 62 | Temp 98.5°F | Resp 18 | Ht 66.0 in | Wt 128.4 lb

## 2018-08-25 DIAGNOSIS — R509 Fever, unspecified: Secondary | ICD-10-CM | POA: Insufficient documentation

## 2018-08-25 DIAGNOSIS — Z8542 Personal history of malignant neoplasm of other parts of uterus: Secondary | ICD-10-CM

## 2018-08-25 DIAGNOSIS — Z8719 Personal history of other diseases of the digestive system: Secondary | ICD-10-CM

## 2018-08-25 DIAGNOSIS — G4730 Sleep apnea, unspecified: Secondary | ICD-10-CM | POA: Diagnosis present

## 2018-08-25 DIAGNOSIS — G620 Drug-induced polyneuropathy: Secondary | ICD-10-CM | POA: Diagnosis present

## 2018-08-25 DIAGNOSIS — M5030 Other cervical disc degeneration, unspecified cervical region: Secondary | ICD-10-CM | POA: Diagnosis present

## 2018-08-25 DIAGNOSIS — G893 Neoplasm related pain (acute) (chronic): Secondary | ICD-10-CM | POA: Insufficient documentation

## 2018-08-25 DIAGNOSIS — N1330 Unspecified hydronephrosis: Secondary | ICD-10-CM | POA: Insufficient documentation

## 2018-08-25 DIAGNOSIS — C250 Malignant neoplasm of head of pancreas: Secondary | ICD-10-CM | POA: Insufficient documentation

## 2018-08-25 DIAGNOSIS — Z7901 Long term (current) use of anticoagulants: Secondary | ICD-10-CM | POA: Diagnosis not present

## 2018-08-25 DIAGNOSIS — Z96653 Presence of artificial knee joint, bilateral: Secondary | ICD-10-CM | POA: Diagnosis present

## 2018-08-25 DIAGNOSIS — N132 Hydronephrosis with renal and ureteral calculous obstruction: Secondary | ICD-10-CM | POA: Diagnosis not present

## 2018-08-25 DIAGNOSIS — E875 Hyperkalemia: Secondary | ICD-10-CM | POA: Diagnosis present

## 2018-08-25 DIAGNOSIS — R609 Edema, unspecified: Secondary | ICD-10-CM | POA: Diagnosis not present

## 2018-08-25 DIAGNOSIS — Z96612 Presence of left artificial shoulder joint: Secondary | ICD-10-CM | POA: Diagnosis present

## 2018-08-25 DIAGNOSIS — E872 Acidosis: Secondary | ICD-10-CM | POA: Diagnosis present

## 2018-08-25 DIAGNOSIS — K831 Obstruction of bile duct: Secondary | ICD-10-CM | POA: Insufficient documentation

## 2018-08-25 DIAGNOSIS — D701 Agranulocytosis secondary to cancer chemotherapy: Secondary | ICD-10-CM | POA: Diagnosis present

## 2018-08-25 DIAGNOSIS — D62 Acute posthemorrhagic anemia: Secondary | ICD-10-CM | POA: Diagnosis not present

## 2018-08-25 DIAGNOSIS — Z923 Personal history of irradiation: Secondary | ICD-10-CM | POA: Insufficient documentation

## 2018-08-25 DIAGNOSIS — T451X5A Adverse effect of antineoplastic and immunosuppressive drugs, initial encounter: Secondary | ICD-10-CM | POA: Diagnosis present

## 2018-08-25 DIAGNOSIS — E440 Moderate protein-calorie malnutrition: Secondary | ICD-10-CM | POA: Diagnosis present

## 2018-08-25 DIAGNOSIS — I480 Paroxysmal atrial fibrillation: Secondary | ICD-10-CM | POA: Diagnosis present

## 2018-08-25 DIAGNOSIS — Z981 Arthrodesis status: Secondary | ICD-10-CM

## 2018-08-25 DIAGNOSIS — Z9049 Acquired absence of other specified parts of digestive tract: Secondary | ICD-10-CM

## 2018-08-25 DIAGNOSIS — K5900 Constipation, unspecified: Secondary | ICD-10-CM | POA: Diagnosis present

## 2018-08-25 DIAGNOSIS — D649 Anemia, unspecified: Secondary | ICD-10-CM | POA: Diagnosis present

## 2018-08-25 DIAGNOSIS — Z9689 Presence of other specified functional implants: Secondary | ICD-10-CM | POA: Diagnosis present

## 2018-08-25 DIAGNOSIS — K760 Fatty (change of) liver, not elsewhere classified: Secondary | ICD-10-CM | POA: Diagnosis present

## 2018-08-25 DIAGNOSIS — N179 Acute kidney failure, unspecified: Principal | ICD-10-CM | POA: Diagnosis present

## 2018-08-25 DIAGNOSIS — C7889 Secondary malignant neoplasm of other digestive organs: Secondary | ICD-10-CM | POA: Insufficient documentation

## 2018-08-25 DIAGNOSIS — R600 Localized edema: Secondary | ICD-10-CM | POA: Diagnosis present

## 2018-08-25 DIAGNOSIS — I10 Essential (primary) hypertension: Secondary | ICD-10-CM | POA: Diagnosis present

## 2018-08-25 DIAGNOSIS — Z86711 Personal history of pulmonary embolism: Secondary | ICD-10-CM

## 2018-08-25 DIAGNOSIS — R110 Nausea: Secondary | ICD-10-CM | POA: Insufficient documentation

## 2018-08-25 DIAGNOSIS — R310 Gross hematuria: Secondary | ICD-10-CM | POA: Diagnosis not present

## 2018-08-25 DIAGNOSIS — Z90411 Acquired partial absence of pancreas: Secondary | ICD-10-CM

## 2018-08-25 DIAGNOSIS — Z9221 Personal history of antineoplastic chemotherapy: Secondary | ICD-10-CM

## 2018-08-25 DIAGNOSIS — Z79891 Long term (current) use of opiate analgesic: Secondary | ICD-10-CM

## 2018-08-25 DIAGNOSIS — I351 Nonrheumatic aortic (valve) insufficiency: Secondary | ICD-10-CM | POA: Diagnosis not present

## 2018-08-25 DIAGNOSIS — Z6822 Body mass index (BMI) 22.0-22.9, adult: Secondary | ICD-10-CM

## 2018-08-25 DIAGNOSIS — Z79899 Other long term (current) drug therapy: Secondary | ICD-10-CM

## 2018-08-25 DIAGNOSIS — Z888 Allergy status to other drugs, medicaments and biological substances status: Secondary | ICD-10-CM

## 2018-08-25 DIAGNOSIS — Z86718 Personal history of other venous thrombosis and embolism: Secondary | ICD-10-CM

## 2018-08-25 DIAGNOSIS — Z882 Allergy status to sulfonamides status: Secondary | ICD-10-CM

## 2018-08-25 DIAGNOSIS — Z90710 Acquired absence of both cervix and uterus: Secondary | ICD-10-CM

## 2018-08-25 LAB — CBC
HCT: 24.9 % — ABNORMAL LOW (ref 36.0–46.0)
Hemoglobin: 7.8 g/dL — ABNORMAL LOW (ref 12.0–15.0)
MCH: 30.8 pg (ref 26.0–34.0)
MCHC: 31.3 g/dL (ref 30.0–36.0)
MCV: 98.4 fL (ref 80.0–100.0)
Platelets: 178 10*3/uL (ref 150–400)
RBC: 2.53 MIL/uL — ABNORMAL LOW (ref 3.87–5.11)
RDW: 15.5 % (ref 11.5–15.5)
WBC: 6.7 10*3/uL (ref 4.0–10.5)
nRBC: 0 % (ref 0.0–0.2)

## 2018-08-25 LAB — CMP (CANCER CENTER ONLY)
ALBUMIN: 3.1 g/dL — AB (ref 3.5–5.0)
ALT: 24 U/L (ref 0–44)
AST: 33 U/L (ref 15–41)
Alkaline Phosphatase: 684 U/L — ABNORMAL HIGH (ref 38–126)
Anion gap: 10 (ref 5–15)
BILIRUBIN TOTAL: 0.8 mg/dL (ref 0.3–1.2)
BUN: 47 mg/dL — AB (ref 8–23)
CALCIUM: 8.3 mg/dL — AB (ref 8.9–10.3)
CO2: 17 mmol/L — AB (ref 22–32)
CREATININE: 3.73 mg/dL — AB (ref 0.44–1.00)
Chloride: 105 mmol/L (ref 98–111)
GFR, EST AFRICAN AMERICAN: 13 mL/min — AB (ref >60)
GFR, Estimated: 12 mL/min — ABNORMAL LOW (ref >60)
GLUCOSE: 103 mg/dL — AB (ref 70–99)
Potassium: 5.3 mmol/L — ABNORMAL HIGH (ref 3.5–5.1)
Sodium: 132 mmol/L — ABNORMAL LOW (ref 135–145)
Total Protein: 7 g/dL (ref 6.5–8.1)

## 2018-08-25 LAB — URINALYSIS, ROUTINE W REFLEX MICROSCOPIC
BACTERIA UA: NONE SEEN
Bilirubin Urine: NEGATIVE
Glucose, UA: NEGATIVE mg/dL
Ketones, ur: NEGATIVE mg/dL
NITRITE: NEGATIVE
PROTEIN: NEGATIVE mg/dL
Specific Gravity, Urine: 1.009 (ref 1.005–1.030)
pH: 5 (ref 5.0–8.0)

## 2018-08-25 LAB — CBC WITH DIFFERENTIAL (CANCER CENTER ONLY)
Abs Immature Granulocytes: 0.02 10*3/uL (ref 0.00–0.07)
BASOS ABS: 0 10*3/uL (ref 0.0–0.1)
Basophils Relative: 0 %
EOS PCT: 2 %
Eosinophils Absolute: 0.2 10*3/uL (ref 0.0–0.5)
HCT: 25.1 % — ABNORMAL LOW (ref 36.0–46.0)
Hemoglobin: 8.2 g/dL — ABNORMAL LOW (ref 12.0–15.0)
Immature Granulocytes: 0 %
Lymphocytes Relative: 10 %
Lymphs Abs: 0.6 10*3/uL — ABNORMAL LOW (ref 0.7–4.0)
MCH: 30.3 pg (ref 26.0–34.0)
MCHC: 32.7 g/dL (ref 30.0–36.0)
MCV: 92.6 fL (ref 80.0–100.0)
Monocytes Absolute: 0.4 10*3/uL (ref 0.1–1.0)
Monocytes Relative: 7 %
NRBC: 0 % (ref 0.0–0.2)
Neutro Abs: 5 10*3/uL (ref 1.7–7.7)
Neutrophils Relative %: 81 %
Platelet Count: 161 10*3/uL (ref 150–400)
RBC: 2.71 MIL/uL — AB (ref 3.87–5.11)
RDW: 15.7 % — AB (ref 11.5–15.5)
WBC: 6.3 10*3/uL (ref 4.0–10.5)

## 2018-08-25 LAB — CREATININE, SERUM
Creatinine, Ser: 3.65 mg/dL — ABNORMAL HIGH (ref 0.44–1.00)
GFR calc Af Amer: 14 mL/min — ABNORMAL LOW (ref >60)
GFR calc non Af Amer: 12 mL/min — ABNORMAL LOW (ref >60)

## 2018-08-25 MED ORDER — MORPHINE SULFATE ER 30 MG PO TBCR
30.0000 mg | EXTENDED_RELEASE_TABLET | Freq: Two times a day (BID) | ORAL | Status: DC
  Administered 2018-08-25 – 2018-08-28 (×5): 30 mg via ORAL
  Filled 2018-08-25 (×6): qty 1

## 2018-08-25 MED ORDER — SODIUM CHLORIDE 0.9 % IV SOLN
INTRAVENOUS | Status: AC
  Administered 2018-08-25: 21:00:00 via INTRAVENOUS

## 2018-08-25 MED ORDER — ALBUTEROL SULFATE (2.5 MG/3ML) 0.083% IN NEBU: 2.5000 mg | INHALATION_SOLUTION | Freq: Four times a day (QID) | RESPIRATORY_TRACT | Status: DC | PRN

## 2018-08-25 MED ORDER — SODIUM POLYSTYRENE SULFONATE 15 GM/60ML PO SUSP: 30.0000 g | Freq: Once | ORAL | 0 refills | Status: DC

## 2018-08-25 MED ORDER — MORPHINE SULFATE 15 MG PO TABS
15.0000 mg | ORAL_TABLET | ORAL | Status: DC | PRN
  Administered 2018-08-26: 15 mg via ORAL
  Filled 2018-08-25 (×2): qty 1

## 2018-08-25 MED ORDER — POLYETHYLENE GLYCOL 3350 17 G PO PACK
17.0000 g | PACK | Freq: Two times a day (BID) | ORAL | Status: DC
  Administered 2018-08-25 – 2018-08-28 (×3): 17 g via ORAL
  Filled 2018-08-25 (×3): qty 1

## 2018-08-25 MED ORDER — HEPARIN SODIUM (PORCINE) 5000 UNIT/ML IJ SOLN
5000.0000 [IU] | Freq: Three times a day (TID) | INTRAMUSCULAR | Status: DC
  Administered 2018-08-25 – 2018-08-28 (×7): 5000 [IU] via SUBCUTANEOUS
  Filled 2018-08-25 (×7): qty 1

## 2018-08-25 MED ORDER — ZOLPIDEM TARTRATE 5 MG PO TABS
5.0000 mg | ORAL_TABLET | Freq: Every evening | ORAL | Status: DC | PRN
  Administered 2018-08-25 – 2018-08-27 (×3): 5 mg via ORAL
  Filled 2018-08-25 (×3): qty 1

## 2018-08-25 MED ORDER — ONDANSETRON HCL 8 MG PO TABS
8.0000 mg | ORAL_TABLET | Freq: Once | ORAL | Status: AC
  Administered 2018-08-25: 8 mg via ORAL

## 2018-08-25 MED ORDER — ONDANSETRON HCL 8 MG PO TABS
ORAL_TABLET | ORAL | Status: AC
  Filled 2018-08-25: qty 1

## 2018-08-25 MED ORDER — FLUTICASONE PROPIONATE 50 MCG/ACT NA SUSP
1.0000 | Freq: Two times a day (BID) | NASAL | Status: DC | PRN
  Filled 2018-08-25: qty 16

## 2018-08-25 MED ORDER — METOPROLOL SUCCINATE ER 25 MG PO TB24
25.0000 mg | ORAL_TABLET | Freq: Every day | ORAL | Status: DC
  Administered 2018-08-26 – 2018-08-28 (×3): 25 mg via ORAL
  Filled 2018-08-25 (×3): qty 1

## 2018-08-25 MED ORDER — SENNOSIDES-DOCUSATE SODIUM 8.6-50 MG PO TABS
1.0000 | ORAL_TABLET | Freq: Two times a day (BID) | ORAL | Status: DC
  Administered 2018-08-25 – 2018-08-28 (×4): 1 via ORAL
  Filled 2018-08-25 (×5): qty 1

## 2018-08-25 MED ORDER — PROMETHAZINE HCL 25 MG PO TABS: 12.5000 mg | ORAL_TABLET | Freq: Four times a day (QID) | ORAL | Status: DC | PRN

## 2018-08-25 MED ORDER — DIPHENHYDRAMINE HCL 25 MG PO CAPS: 25.0000 mg | ORAL_CAPSULE | Freq: Every day | ORAL | Status: DC | PRN

## 2018-08-25 MED ORDER — SODIUM ZIRCONIUM CYCLOSILICATE 5 G PO PACK
5.0000 g | PACK | Freq: Once | ORAL | Status: AC
  Administered 2018-08-25: 5 g via ORAL
  Filled 2018-08-25: qty 1

## 2018-08-25 MED ORDER — SODIUM BICARBONATE 650 MG PO TABS
650.0000 mg | ORAL_TABLET | Freq: Every day | ORAL | Status: DC
  Filled 2018-08-25: qty 1

## 2018-08-25 MED ORDER — HYDROCODONE-ACETAMINOPHEN 5-325 MG PO TABS
1.0000 | ORAL_TABLET | ORAL | Status: DC | PRN
  Administered 2018-08-28: 1 via ORAL
  Filled 2018-08-25: qty 1

## 2018-08-25 MED FILL — SPS 15 GM/60 ML SUSPENSION: 15 | 1 days supply | Qty: 120 | Fill #0
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
## 2018-08-25 NOTE — Progress Notes (Addendum)
[place] OFFICE PROGRESS NOTE   Diagnosis: Pancreas cancer  INTERVAL HISTORY:   [name] returns as scheduled.  She continues to have abdominal and back pain.  The pain is relieved with the current narcotic regimen.  She complains of constipation.  She has nausea and malaise for the past few weeks.  Constipation has persisted despite MiraLAX, Colace, and an over-the-counter laxative.  No fever.  Objective:  Vital signs in last 24 hours:  Blood pressure 115/72, pulse 62, temperature 98.5 F (36.9 C), temperature source Oral, resp. rate 18, height 5\' 6"  (1.676 m), weight 128 lb 6.4 oz (58.2 kg), SpO2 100 %.    HEENT: No thrush Resp: Lungs clear bilaterally Cardio: Regular rate and rhythm GI: No hepatomegaly, tender throughout the upper abdomen, no mass Vascular: Trace pitting edema at the right greater than left lower leg    Lab Results:  Lab Results  Component Value Date   WBC 6.3 08/25/2018   HGB 8.2 (L) 08/25/2018   HCT 25.1 (L) 08/25/2018   MCV 92.6 08/25/2018   PLT 161 08/25/2018   NEUTROABS 5.0 08/25/2018    CMP  Lab Results  Component Value Date   NA 132 (L) 08/25/2018   K 5.3 (H) 08/25/2018   CL 105 08/25/2018   CO2 17 (L) 08/25/2018   GLUCOSE 103 (H) 08/25/2018   BUN 47 (H) 08/25/2018   CREATININE 3.73 (HH) 08/25/2018   CALCIUM 8.3 (L) 08/25/2018   PROT 7.0 08/25/2018   ALBUMIN 3.1 (L) 08/25/2018   AST 33 08/25/2018   ALT 24 08/25/2018   ALKPHOS 684 (H) 08/25/2018   BILITOT 0.8 08/25/2018   GFRNONAA 12 (L) 08/25/2018   GFRAA 13 (L) 08/25/2018   Medications: I have reviewed the patient's current medications.   Assessment/Plan: 1. 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  Gemcitabine held 11/02/2014 due to neutropenia.  Gemcitabine 11/09/2014 dose reduced 800 mg/m.  Gemcitabine held 11/16/2014 due to neutropenia.  Gemcitabine resumed 11/23/2014 every 2 week schedule.  Cycle 6 gemcitabine 01/05/2015  Cycle 7 gemcitabine 01/18/2015  Cycle 8 gemcitabine 02/01/2015  Cycle 9 gemcitabine 02/15/2015  Cycle 10 gemcitabine 03/01/2015  Cycle 11 gemcitabine 03/15/2015  Cycle 12 gemcitabine 03/29/2015  Elevated CA 19-01 May 2016  CTs 06/17/2016-new soft tissue mass at the root of the mesentery with vascular involvement  PET 06/27/2016-hypermetabolic activity associated with soft tissue adjacent to surgical clips in the central mesentery  Status post SBRTto the mesenteric mass completed 07/26/2016  CT abdomen/pelvis 08/23/2016 -mesenteric mass stable to slightly decreased in size.  Cycle 1 FOLFOX 09/03/2016  Cycle 2 FOLFOX 09/30/2016  Cycle 3 FOLFOX 10/14/2016   Cycle 4 FOLFOX 11/05/2016 (5-FU bolus eliminated and 5-FU pump dose reduced)  Cycle 5 FOLFOX 11/19/2016  CT abdomen/pelvis 11/28/2016-decreased size of soft tissue at the small bowel mesentery, mild asymmetric soft tissue at the left vaginal cuff  Cycle 6 FOLFOX 12/10/2016 (5-FU infusion further reduced and oxaliplatin reduced)  Cycle 7 FOLFOX 12/31/2016  Cycle 8 FOLFOX 01/21/2017   CT 02/24/2017-slight decrease in size of the mesenteric mass, resolution of soft tissue fullness at the left vaginal cuff, no evidence of disease progression  CT abdomen/pelvis 06/20/2017-stable ill-defined soft tissue at the mesenteric root, mild  increased asymmetry at the left vaginal cuff, no other evidence of disease progression  Elevated CA 19-9 09/11/2018  CT abdomen/pelvis 11/18/2017-no evidence of recurrent pancreas cancer, dilated afferentloop, intrahepatic biliary dilatation  PET scan 01/16/2018-low level  hypermetabolic activity in the porta hepatis which appears to correspond with a mildly enlarged lymph node on MRI. No other suspicious nodal activity in the abdomen. Small right paratracheal and subcarinal lymph nodes with low-level hypermetabolic activity in the chest. No abnormal activity within the liver. Findings suspicious for obstruction at the right ureteropelvic junction. Asymmetric left oropharyngeal activity.  CT abdomen/pelvis 03/31/2018-significant increaseinright-sided retroperitoneal soft tissue which invades and encases the IVC, celiac trunk, SMA, duodenum and right renal pelvis. Interval progression of intrahepatic and common bile duct dilatation. New right-sided obstructive uropathy secondary to tumor invasion of the right renal pelvis.  Cycle 1 gemcitabine/Abraxane 04/08/2018  Cycle 2 gemcitabine/Abraxane 04/22/2018   CT 04/26/2018-slight decrease in tumor encasing the duodenum, IVC, right renal pelvis, and celiac with slight decrease in hydroureteronephrosis  Cycle 3 gemcitabine/Abraxane 05/12/2018  Cycle 4 gemcitabine/Abraxane 05/26/2018  Cycle 5 gemcitabine/Abraxane 06/09/2018  Cycle 6 gemcitabine/Abraxane 06/23/2018  Cycle 7 gemcitabine/Abraxane 07/07/2018  Restaging CTs 07/27/2018- mild progression of retroperitoneal infiltrative tumor   2. bile duct obstruction secondary to #1, status post an ERCP with stent placement 09/23/2015hypermetabolic soft tissue in the central mesentery, no other evidence of metastatic disease, stable mildly enlarged portal caval node  3. Admission with post ERCP pancreatitis 06/16/2014  4. History of abdominal pain secondary to #1  5. Pulmonary embolism diagnosed on a CT of the abdomen 09/16/2014  Negative lower extremity Dopplers 09/17/2014  6. Multiple orthopedic surgical procedures  7. Endometrial cancer,stage IA, grade 1 endometrioid adenocarcinoma, 18% myometrial invasion, no lymphovascular space involvement,  negative washings  Status post robotic total hysterectomy and bilateral salpingo-oophorectomy 11/30/2010  Recurrent tumor left lateral vagina status post biopsy 11/24/2014 with pathology confirming adenocarcinoma with focal squamous differentiation consistent with endometrial adenocarcinoma  Staging CT scans 12/06/2014 with no evidence of local pancreatic cancer recurrence. Small fluid collection adjacent to the left adrenal gland. Severe hepatic steatosis. No evidence of local extension of endometrial carcinoma. Carcinoma not well-defined at the vaginal cuff. 5 mm right external iliac lymph node. 3.6 mm left external iliac lymph node  Brachytherapy initiated 12/22/2014, completed 01/19/2015  CT abdomen/pelvis 07/24/2015 revealed a 3 x 4 cm soft tissue focus at the vaginal apex  PET scan 08/11/2015 revealed no mass at the vaginal apex and no evidence of metastatic disease  CT 11/28/2016-mild asymmetric soft tissue at the left vaginal cuff, resolved on CT 02/24/2017  CT abdomen/pelvis 06/20/2017-stable mild ill-defined soft tissue density in the mesenteric root. Stable mild portacaval lymphadenopathy and subcentimeter right retroperitoneal lymph nodes. Mild increased size of asymmetric soft tissue density involving the left vaginal cuff.  PET scan 07/19/2017-no suspicious hypermetabolic activity within the neck, chest, abdomen or pelvis. Specifically no evidence of residual hypermetabolic tumor in the surgical bed or at the vaginal cuff. Hypermetabolic activity in the lumbar spine at the level of the right L3-4 facet joint appears degenerative.  MRI abdomen 12/02/2017-focal area of abnormal signal in the caudate lobe of the liver-unclear etiology, dilation of the hepaticojejunostomy loop, bile ducts, and pancreatic duct-stricture formation versus recurrent tumor  8. History of atrial fibrillation-maintained on xarelto  9. Family history of multiple cancers-negative CancerNext gene  panel  10. Prolonged nausea following the pancreaticoduodenectomy. Improved 10/26/2014.  11. Port-A-Cath placement 10/21/2014.  12. History of Neutropenia secondary to chemotherapy   13.  Diarrhea. Question pancreatic insufficiency. Pancreatic enzyme replacement initiated 01/05/2015. Recurrent diarrhea following a course of antibiotics March 2017.  14. History of positional vertigo-resolved  15. 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 Neutropenia secondary to chemotherapy, G-CSF was added with cycle 2 gemcitabine/Abraxane  17. Delayed nausea and diarrhea following FOLFOX. Emend added with cycle 2. Decadron prophylaxis added with cycle 3  18. Diarrhea 10/28/2016. Question related to chemotherapy. Negative C. difficile testing 10/31/2016.  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  20.Admission 12/02/2017 with Bacteroides bacteremia  Biliary obstruction documented on CT/MRI 12/02/2017  Upper endoscopy 12/05/2017 revealed angulation of the apparent limb precluding intubation  Status post placement of a biliary drain 12/05/2017, replaced 12/09/2017  Bile duct brushings 12/09/2017-negative for malignancy  Biliary drain cholangiogram 12/18/2017-there is patency of the biliary-enteric anastomosis with no contrast traverses the afferentlimb into the jejunum  Jejunal stent placed 01/28/2018, brush biopsy negative for malignancy  Biliary drain removed 02/11/2018  21.Admission 12/29/2017 through 01/02/2018 with sepsis secondary to Klebsiella bacteremia/cholangitis. 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  22.Right pelvicaliectasis on the PET scan 01/16/2018 with perinephric soft tissue fullness, the right ureter  is not dilated 23. Admission 04/28/2018 with nausea/vomiting, increased abdominal pain, and a headache  Blood culture from 04/28/2018+ for Klebsiella pneumoniae, completed outpatient course of ampicillin-sulbactam  Port-A-Cath removed 05/02/2018 24. Anemia secondary to chronic disease, bleeding,and chemotherapy 25.Fever, blood culture positive for staph epidermidis 06/24/2018-completed course of vancomycin;repeat blood culture on 07/07/2018 positive for Staphylococcus, methicillin-resistant. She completed a course of vancomycin.PICC removed 07/08/2018 26.  Acute renal failure 08/25/2018  Disposition: [name] has metastatic pancreas cancer.  She presents today to discuss salvage chemotherapy.  She indicates she does not wish to receive further oxaliplatin.  She is not a candidate for chemotherapy with the acute renal failure and her current performance status.  The etiology of the renal failure is unclear, but I suspect obstructive nephropathy.  She has tumor involving the right renal pelvis.  We will refer her for a stat renal ultrasound and request a urology consult as indicated.  She has hyperkalemia.  She will receive a dose of Kayexalate today.  She will return for an office visit and chemistry panel tomorrow.  I recommended she increase the MiraLAX to twice daily.   Betsy Coder, MD  08/25/2018  12:11 PM  Addendum: The renal sound revealed bilateral hydronephrosis.  I contacted Dr. Gloriann Loan and the hospitalist service.  [name] will be admitted for management of the acute renal failure, hyperkalemia, and placement of ureter stents. She will be referred for a CT abdomen today.

## 2018-08-25 NOTE — Addendum Note (Signed)
Addended by: Betsy Coder B on: 08/25/2018 06:17 PM   Modules accepted: Level of Service

## 2018-08-25 NOTE — Progress Notes (Signed)
Dr. [name] and Dr. Gloriann Loan discussed results of renal ultrasound. He will see her tomorrow at 10:00. Requests she be NPO after midnight in case he can place stents. Requesting CT abd/pelvis without oral or IV contrast. Patient agrees to procedure today. CT to be done now-patient sent back to radiology. She will send daughter to pick up her script at [facility].

## 2018-08-25 NOTE — Progress Notes (Signed)
Renal ultrasound scheduled for 3 pm today and patient is aware. Called managed care and left VM in case scan needs prior authorization.

## 2018-08-26 ENCOUNTER — Encounter (HOSPITAL_COMMUNITY): Payer: Self-pay | Admitting: Certified Registered Nurse Anesthetist

## 2018-08-26 ENCOUNTER — Inpatient Hospital Stay (HOSPITAL_COMMUNITY): Payer: Medicare Other

## 2018-08-26 ENCOUNTER — Ambulatory Visit: Payer: Medicare Other | Admitting: Oncology

## 2018-08-26 ENCOUNTER — Inpatient Hospital Stay (HOSPITAL_COMMUNITY): Payer: Medicare Other | Admitting: Anesthesiology

## 2018-08-26 ENCOUNTER — Encounter (HOSPITAL_COMMUNITY)
Admission: AD | Disposition: A | Discharge status: Home / Self Care | Payer: Self-pay | Source: Ambulatory Visit | Attending: Internal Medicine

## 2018-08-26 DIAGNOSIS — I351 Nonrheumatic aortic (valve) insufficiency: Secondary | ICD-10-CM

## 2018-08-26 DIAGNOSIS — R609 Edema, unspecified: Secondary | ICD-10-CM

## 2018-08-26 DIAGNOSIS — E440 Moderate protein-calorie malnutrition: Secondary | ICD-10-CM

## 2018-08-26 DIAGNOSIS — N132 Hydronephrosis with renal and ureteral calculous obstruction: Secondary | ICD-10-CM

## 2018-08-26 DIAGNOSIS — N179 Acute kidney failure, unspecified: Principal | ICD-10-CM

## 2018-08-26 DIAGNOSIS — C250 Malignant neoplasm of head of pancreas: Secondary | ICD-10-CM

## 2018-08-26 HISTORY — PX: CYSTOSCOPY WITH STENT PLACEMENT: SHX5790

## 2018-08-26 LAB — BASIC METABOLIC PANEL
Anion gap: 13 (ref 5–15)
BUN: 55 mg/dL — AB (ref 8–23)
CO2: 15 mmol/L — ABNORMAL LOW (ref 22–32)
CREATININE: 3.76 mg/dL — AB (ref 0.44–1.00)
Calcium: 8 mg/dL — ABNORMAL LOW (ref 8.9–10.3)
Chloride: 105 mmol/L (ref 98–111)
GFR calc Af Amer: 13 mL/min — ABNORMAL LOW (ref >60)
GFR calc non Af Amer: 11 mL/min — ABNORMAL LOW (ref >60)
Glucose, Bld: 88 mg/dL (ref 70–99)
Potassium: 5.1 mmol/L (ref 3.5–5.1)
Sodium: 133 mmol/L — ABNORMAL LOW (ref 135–145)

## 2018-08-26 LAB — CBC WITH DIFFERENTIAL/PLATELET
Abs Immature Granulocytes: 0.04 10*3/uL (ref 0.00–0.07)
BASOS PCT: 1 %
Basophils Absolute: 0 10*3/uL (ref 0.0–0.1)
EOS ABS: 0.2 10*3/uL (ref 0.0–0.5)
EOS PCT: 3 %
HCT: 23.9 % — ABNORMAL LOW (ref 36.0–46.0)
Hemoglobin: 7.5 g/dL — ABNORMAL LOW (ref 12.0–15.0)
IMMATURE GRANULOCYTES: 1 %
LYMPHS ABS: 1 10*3/uL (ref 0.7–4.0)
Lymphocytes Relative: 17 %
MCH: 30.6 pg (ref 26.0–34.0)
MCHC: 31.4 g/dL (ref 30.0–36.0)
MCV: 97.6 fL (ref 80.0–100.0)
MONO ABS: 0.5 10*3/uL (ref 0.1–1.0)
MONOS PCT: 8 %
NEUTROS PCT: 70 %
Neutro Abs: 4.2 10*3/uL (ref 1.7–7.7)
PLATELETS: 163 10*3/uL (ref 150–400)
RBC: 2.45 MIL/uL — ABNORMAL LOW (ref 3.87–5.11)
RDW: 15.5 % (ref 11.5–15.5)
WBC: 5.9 10*3/uL (ref 4.0–10.5)
nRBC: 0 % (ref 0.0–0.2)

## 2018-08-26 LAB — ECHOCARDIOGRAM COMPLETE
Height: 63 in
Weight: 2068.8 oz

## 2018-08-26 LAB — RETICULOCYTES
Immature Retic Fract: 13.2 % (ref 2.3–15.9)
RBC.: 2.56 MIL/uL — ABNORMAL LOW (ref 3.87–5.11)
RETIC COUNT ABSOLUTE: 21 10*3/uL (ref 19.0–186.0)
Retic Ct Pct: 0.8 % (ref 0.4–3.1)

## 2018-08-26 LAB — COMPREHENSIVE METABOLIC PANEL
ALT: 21 U/L (ref 0–44)
AST: 26 U/L (ref 15–41)
Albumin: 3.1 g/dL — ABNORMAL LOW (ref 3.5–5.0)
Alkaline Phosphatase: 536 U/L — ABNORMAL HIGH (ref 38–126)
Anion gap: 9 (ref 5–15)
BUN: 55 mg/dL — ABNORMAL HIGH (ref 8–23)
CO2: 18 mmol/L — ABNORMAL LOW (ref 22–32)
Calcium: 7.9 mg/dL — ABNORMAL LOW (ref 8.9–10.3)
Chloride: 104 mmol/L (ref 98–111)
Creatinine, Ser: 3.93 mg/dL — ABNORMAL HIGH (ref 0.44–1.00)
GFR calc Af Amer: 13 mL/min — ABNORMAL LOW (ref >60)
GFR calc non Af Amer: 11 mL/min — ABNORMAL LOW (ref >60)
Glucose, Bld: 84 mg/dL (ref 70–99)
Potassium: 5.4 mmol/L — ABNORMAL HIGH (ref 3.5–5.1)
Sodium: 131 mmol/L — ABNORMAL LOW (ref 135–145)
Total Bilirubin: 1.1 mg/dL (ref 0.3–1.2)
Total Protein: 6.1 g/dL — ABNORMAL LOW (ref 6.5–8.1)

## 2018-08-26 LAB — VITAMIN B12: Vitamin B-12: 870 pg/mL (ref 180–914)

## 2018-08-26 LAB — FOLATE: Folate: 16.3 ng/mL (ref >5.9)

## 2018-08-26 LAB — IRON AND TIBC
Iron: 19 ug/dL — ABNORMAL LOW (ref 28–170)
Saturation Ratios: 10 % — ABNORMAL LOW (ref 10.4–31.8)
TIBC: 186 ug/dL — ABNORMAL LOW (ref 250–450)
UIBC: 167 ug/dL

## 2018-08-26 LAB — FERRITIN: Ferritin: 109 ng/mL (ref 11–307)

## 2018-08-26 LAB — PREPARE RBC (CROSSMATCH)

## 2018-08-26 SURGERY — CYSTOSCOPY, WITH STENT INSERTION
Anesthesia: General | Laterality: Bilateral

## 2018-08-26 MED ORDER — SODIUM CHLORIDE 0.9 % IR SOLN
Status: DC | PRN
  Administered 2018-08-26: 3000 mL via INTRAVESICAL

## 2018-08-26 MED ORDER — LIDOCAINE 2% (20 MG/ML) 5 ML SYRINGE
INTRAMUSCULAR | Status: DC | PRN
  Administered 2018-08-26: 40 mg via INTRAVENOUS

## 2018-08-26 MED ORDER — PROPOFOL 10 MG/ML IV BOLUS
INTRAVENOUS | Status: AC
  Filled 2018-08-26: qty 20

## 2018-08-26 MED ORDER — SODIUM CHLORIDE 0.9% IV SOLUTION
Freq: Once | INTRAVENOUS | Status: AC
  Administered 2018-08-26: 15:00:00 via INTRAVENOUS

## 2018-08-26 MED ORDER — DEXAMETHASONE SODIUM PHOSPHATE 10 MG/ML IJ SOLN
INTRAMUSCULAR | Status: AC
  Filled 2018-08-26: qty 1

## 2018-08-26 MED ORDER — FENTANYL CITRATE (PF) 100 MCG/2ML IJ SOLN
INTRAMUSCULAR | Status: AC
  Filled 2018-08-26: qty 2

## 2018-08-26 MED ORDER — CEFAZOLIN SODIUM-DEXTROSE 2-4 GM/100ML-% IV SOLN
2.0000 g | Freq: Once | INTRAVENOUS | Status: AC
  Administered 2018-08-26: 2 g via INTRAVENOUS

## 2018-08-26 MED ORDER — CEFAZOLIN SODIUM-DEXTROSE 2-4 GM/100ML-% IV SOLN
INTRAVENOUS | Status: AC
  Filled 2018-08-26: qty 100

## 2018-08-26 MED ORDER — IOHEXOL 300 MG/ML  SOLN
INTRAMUSCULAR | Status: DC | PRN
  Administered 2018-08-26: 20 mL via URETHRAL

## 2018-08-26 MED ORDER — BISACODYL 10 MG RE SUPP
10.0000 mg | Freq: Once | RECTAL | Status: AC
  Administered 2018-08-26: 10 mg via RECTAL
  Filled 2018-08-26: qty 1

## 2018-08-26 MED ORDER — SUCCINYLCHOLINE CHLORIDE 200 MG/10ML IV SOSY
PREFILLED_SYRINGE | INTRAVENOUS | Status: DC | PRN
  Administered 2018-08-26: 100 mg via INTRAVENOUS

## 2018-08-26 MED ORDER — 0.9 % SODIUM CHLORIDE (POUR BTL) OPTIME
TOPICAL | Status: DC | PRN
  Administered 2018-08-26: 1000 mL

## 2018-08-26 MED ORDER — ONDANSETRON HCL 4 MG/2ML IJ SOLN
INTRAMUSCULAR | Status: AC
  Filled 2018-08-26: qty 2

## 2018-08-26 MED ORDER — DEXAMETHASONE SODIUM PHOSPHATE 10 MG/ML IJ SOLN
INTRAMUSCULAR | Status: DC | PRN
  Administered 2018-08-26: 10 mg via INTRAVENOUS

## 2018-08-26 MED ORDER — FENTANYL CITRATE (PF) 100 MCG/2ML IJ SOLN: 25.0000 ug | INTRAMUSCULAR | Status: DC | PRN

## 2018-08-26 MED ORDER — PROPOFOL 10 MG/ML IV BOLUS
INTRAVENOUS | Status: DC | PRN
  Administered 2018-08-26: 80 mg via INTRAVENOUS

## 2018-08-26 MED ORDER — FENTANYL CITRATE (PF) 100 MCG/2ML IJ SOLN
INTRAMUSCULAR | Status: DC | PRN
  Administered 2018-08-26 (×2): 25 ug via INTRAVENOUS

## 2018-08-26 MED ORDER — SODIUM POLYSTYRENE SULFONATE 15 GM/60ML PO SUSP
30.0000 g | Freq: Once | ORAL | Status: DC
  Filled 2018-08-26: qty 120

## 2018-08-26 MED ORDER — ONDANSETRON HCL 4 MG/2ML IJ SOLN
INTRAMUSCULAR | Status: DC | PRN
  Administered 2018-08-26: 4 mg via INTRAVENOUS

## 2018-08-26 MED ORDER — LIDOCAINE 2% (20 MG/ML) 5 ML SYRINGE
INTRAMUSCULAR | Status: AC
  Filled 2018-08-26: qty 5

## 2018-08-26 SURGICAL SUPPLY — 17 items
BAG URINE DRAINAGE (UROLOGICAL SUPPLIES) ×1 IMPLANT
BAG URO CATCHER STRL LF (MISCELLANEOUS) ×2 IMPLANT
CATH FOLEY 2WAY SLVR  5CC 16FR (CATHETERS) ×1
CATH FOLEY 2WAY SLVR 5CC 16FR (CATHETERS) IMPLANT
CATH INTERMIT  6FR 70CM (CATHETERS) ×1 IMPLANT
CLOTH BEACON ORANGE TIMEOUT ST (SAFETY) ×1 IMPLANT
COVER WAND RF STERILE (DRAPES) IMPLANT
GLOVE BIO SURGEON STRL SZ7.5 (GLOVE) ×2 IMPLANT
GOWN STRL REUS W/TWL LRG LVL3 (GOWN DISPOSABLE) ×3 IMPLANT
GUIDEWIRE STR DUAL SENSOR (WIRE) ×2 IMPLANT
HOLDER FOLEY CATH W/STRAP (MISCELLANEOUS) ×1 IMPLANT
MANIFOLD NEPTUNE II (INSTRUMENTS) ×2 IMPLANT
PACK CYSTO (CUSTOM PROCEDURE TRAY) ×2 IMPLANT
PROTECTOR NERVE ULNAR (MISCELLANEOUS) ×1 IMPLANT
STENT URET 6FRX24 CONTOUR (STENTS) ×2 IMPLANT
TUBING CONNECTING 10 (TUBING) ×2 IMPLANT
WATER STERILE IRR 500ML POUR (IV SOLUTION) ×1 IMPLANT

## 2018-08-26 NOTE — Progress Notes (Signed)
  Echocardiogram 2D Echocardiogram has been performed.  Madelaine Etienne 08/26/2018, 3:58 PM

## 2018-08-26 NOTE — Anesthesia Preprocedure Evaluation (Addendum)
Anesthesia Evaluation  Patient identified by MRN, date of birth, ID band Patient awake    Reviewed: Allergy & Precautions, NPO status , Patient's Chart, lab work & pertinent test results  Airway Mallampati: II  TM Distance: >3 FB Neck ROM: Full    Dental no notable dental hx. (+) Teeth Intact, Dental Advisory Given   Pulmonary sleep apnea ,    Pulmonary exam normal breath sounds clear to auscultation       Cardiovascular hypertension, + DVT  Normal cardiovascular exam+ dysrhythmias Atrial Fibrillation  Rhythm:Regular Rate:Normal  TTE 11/2017 Left ventricle: Global LV longitudinal strain is normal at -17%. The cavity size was normal. Systolic function was normal. The estimated ejection fraction was in the range of 55% to 60%. Wall motion was normal; there were no regional wall motion abnormalities. The study is not technically sufficient to allow evaluation of LV diastolic function. - Aortic valve: Trileaflet; mildly thickened, mildly calcified   leaflets. There was mild regurgitation    Neuro/Psych PSYCHIATRIC DISORDERS Anxiety negative neurological ROS     GI/Hepatic Neg liver ROS, GERD  ,  Endo/Other  negative endocrine ROS  Renal/GU negative Renal ROS  negative genitourinary   Musculoskeletal  (+) Arthritis , Osteoarthritis,    Abdominal   Peds negative pediatric ROS (+)  Hematology negative hematology ROS (+) anemia ,   Anesthesia Other Findings Pancreatic cancer s/p whipple and biliary drain 11/2017  On xarelto  Reproductive/Obstetrics negative OB ROS                           Anesthesia Physical Anesthesia Plan  ASA: III  Anesthesia Plan: General   Post-op Pain Management:    Induction: Intravenous  PONV Risk Score and Plan: 3 and Ondansetron, Dexamethasone and Treatment may vary due to age or medical condition  Airway Management Planned: LMA  Additional Equipment:    Intra-op Plan:   Post-operative Plan: Extubation in OR  Informed Consent: I have reviewed the patients History and Physical, chart, labs and discussed the procedure including the risks, benefits and alternatives for the proposed anesthesia with the patient or authorized representative who has indicated his/her understanding and acceptance.   Dental advisory given  Plan Discussed with: CRNA  Anesthesia Plan Comments:         Anesthesia Quick Evaluation

## 2018-08-26 NOTE — Op Note (Signed)
Operative Note  Preoperative diagnosis:  1. Bilateral hydronephrosis  Postoperative diagnosis: 1. bilateral hydronephrosis  Procedure(s): 1. Cystoscopy with bilateral retrograde pyelogram, bilateral ureteral stent placement  Surgeon: Link Snuffer, MD  Assistants:none  Anesthesia: General  Complications: none immediate  EBL: minimal  Specimens: 1. none  Drains/Catheters: 1. Bilateral 6 x 24 double-J ureteral stents  Intraoperative findings: .  Normal urethra and bladder 2.  Left retrograde pyelogram revealed a narrowing at the mid left ureter followed by hydroureteronephrosis.  Right retrograde sonogram revealed a severely narrow ureter with hydronephrosis. The right-sided stent was difficult to place due to the amount of narrowing.    Indication: 72 year old femalewith metastatic pancreatic cancer who I followed in the past for  Right sided hydronephrosis that we were observing.  Yesterday, she was found to have acute renal insufficiency.  Renal ultrasound showed bilateral hydronephrosis and this was confirmed with CT scan.  Given this finding as well asworsening kidney function, decision was made to proceed with the above operation.  Description of procedure:  The patient was identified and consent was obtained.  The patient was taken to the operating room and placed in the supine position.  The patient was placed under Generalanesthesia.  Perioperative antibiotics were administered.  The patient was placed in dorsal lithotomy.  Patient was prepped and draped in a standard sterile fashion and a timeout was performed.  A 21 French rigid cystoscope was advanced into the urethra and into the bladder.  Complete cystoscopy was performed with no abnormal findings.  The left ureter was cannulated with an open-ended ureteral catheter and a retrograde pyelogram was performed with the findings noted above.   A wire was then advanced into the kidney under fluoroscopic guidance.  A 6 x 26  double-J ureteral stent was then placed in a standard fashion followed by removal of the wire.  Fluoroscopy confirmed proximal placement and direct visualization confirmed a good coil within the bladder.  The right distal ureter was cannulated with the open-ended ureteral catheter and a retrograde pyelogram was performed with the findings noted above.  A wire was then advanced into the kidney under fluoroscopic guidance.  A 6 x 24 double-J ureteral stent was then advanced up the ureter.  There was quite a bit of resistance consistent with the finding of significant narrowing of the entire ureter.  I was able to get the stent up and placement was confirmed with fluoroscopy.  There was some bloody output from this right ureter.  There was also a larger amount of length from the stent protruding into the bladder.  I withdrew the scope and place a urethral catheter.  The patient tolerated the procedure well with stable postoperatively.  Plan:observe overnight.  Continue to follow creatinine.  If she has significant pain on the right side that is persistent or significant voiding complaints.  I would recommend removing the right-sided stent and seeing how she does with the left-sided stent alone.  Hopefully she will do well with both stents however.  Voiding trial tomorrow if she is doing well.

## 2018-08-26 NOTE — Anesthesia Procedure Notes (Signed)
Procedure Name: Intubation Date/Time: 08/26/2018 12:59 PM Performed by: Maxwell Caul, CRNA Pre-anesthesia Checklist: Patient identified, Emergency Drugs available, Suction available and Patient being monitored Patient Re-evaluated:Patient Re-evaluated prior to induction Oxygen Delivery Method: Circle system utilized Preoxygenation: Pre-oxygenation with 100% oxygen Induction Type: IV induction Tube type: Oral Number of attempts: 1 Airway Equipment and Method: Stylet Placement Confirmation: ETT inserted through vocal cords under direct vision,  positive ETCO2 and breath sounds checked- equal and bilateral Secured at: 20 cm Tube secured with: Tape Dental Injury: Teeth and Oropharynx as per pre-operative assessment

## 2018-08-26 NOTE — Progress Notes (Signed)
IP PROGRESS NOTE  Subjective:   She continues to have nausea and constipation.  She says Phenergan helps the nausea.  Objective: Vital signs in last 24 hours: Blood pressure 123/67, pulse 61, temperature (P) 97.6 F (36.4 C), resp. rate 18, height 5\' 3"  (1.6 m), weight 129 lb 4.8 oz (58.7 kg), SpO2 99 %.  Intake/Output from previous day: 12/03 0701 - 12/04 0700 In: 396.2 [I.V.:396.2] Out: 250 [Urine:250]  Physical Exam:  HEENT: No thrush Lungs: Clear bilaterally Cardiac: Regular rate and rhythm with premature beats Abdomen: No hepatomegaly, tender in the upper abdomen Extremities: Trace pitting edema at the right greater than left lower leg   Lab Results: Recent Labs    08/25/18 1948 08/26/18 0531  WBC 6.7 5.9  HGB 7.8* 7.5*  HCT 24.9* 23.9*  PLT 178 163    BMET Recent Labs    08/25/18 1045 08/25/18 1948 08/26/18 0531  NA 132*  --  131*  K 5.3*  --  5.4*  CL 105  --  104  CO2 17*  --  18*  GLUCOSE 103*  --  84  BUN 47*  --  55*  CREATININE 3.73* 3.65* 3.93*  CALCIUM 8.3*  --  7.9*    No results found for: CEA1  Studies/Results: Ct Abdomen Pelvis Wo Contrast  Result Date: 08/25/2018 CLINICAL DATA:  Bilateral hydronephrosis. Acute renal failure. Metastatic pancreatic carcinoma. EXAM: CT ABDOMEN AND PELVIS WITHOUT CONTRAST TECHNIQUE: Multidetector CT imaging of the abdomen and pelvis was performed following the standard protocol without IV contrast. COMPARISON:  07/27/2018 FINDINGS: Lower chest: No acute findings. Hepatobiliary: No mass visualized on this unenhanced exam. Diffuse biliary ductal dilatation and pneumobilia show no significant change. An internal biliary wall stent is unchanged in position in the distal common bile duct and duodenum. Prior cholecystectomy noted. Pancreas: Diffuse pancreatic atrophy. Soft tissue opacity in the area of the pancreatic head and aortocaval space encasing the distal common bile duct stent measures 4.2 x 4.1 cm on image  35/2, without significant change in size since previous study. Spleen:  Within normal limits in size. Adrenals/Urinary tract: Moderate right hydronephrosis is stable. Mild left hydroureteronephrosis is new since prior study with tapering of the distal ureter at the level of the iliac vessels. No obstructing calculus or mass identified. Unremarkable unopacified urinary bladder. Stomach/Bowel: The stomach remains dilated, suspicious for gastric outlet obstruction. No evidence of small bowel or colonic dilatation. Large amount of stool is seen throughout the colon. Vascular/Lymphatic: Retroperitoneal paraaortic lymphadenopathy is again seen with largest area measuring 2.7 cm on image 41/2, also without significant change. No pelvic lymphadenopathy identified. Aortic atherosclerosis. No evidence of abdominal aortic aneurysm. Reproductive:  No mass or other significant abnormality. Other: Increased diffuse mesenteric and body wall edema noted. New mild perihepatic ascites. Musculoskeletal: No suspicious bone lesions identified. Old L5 vertebral body compression fracture deformity again seen. IMPRESSION: New mild left hydroureteronephrosis. No ureteral calculus or other obstructing etiology apparent by CT. Stable moderate right hydronephrosis. Stable soft tissue mass in the area of the pancreatic head. No significant change in mild peripancreatic and retroperitoneal lymphadenopathy. Stable diffuse biliary ductal dilatation with biliary wall stent in place. Mild increase in diffuse mesenteric and body wall edema, and minimal ascites. Large stool burden noted; recommend clinical correlation for possible constipation. Electronically Signed   By: Earle Gell M.D.   On: 08/25/2018 18:06   US Renal  Result Date: 08/25/2018 CLINICAL DATA:  Acute renal failure, history of pancreatic cancer. EXAM: RENAL /  URINARY TRACT ULTRASOUND COMPLETE COMPARISON:  CT scan of July 27, 2018. FINDINGS: Right Kidney: Renal measurements:  10.2 x 6.1 x 4.7 cm = volume: 153 mL . Echogenicity within normal limits. No mass visualized. Moderate hydronephrosis is noted. Left Kidney: Renal measurements: 12.1 x 6.5 x 6.1 cm = volume: 250 mL. Echogenicity within normal limits. No mass visualized. Mild left hydronephrosis is noted. Bladder: Appears normal for degree of bladder distention. IMPRESSION: Bilateral hydronephrosis is noted, right greater than left. Electronically Signed   By: Marijo Conception, M.D.   On: 08/25/2018 15:03   Dg C-arm 1-60 Min-no Report  Result Date: 08/26/2018 Fluoroscopy was utilized by the requesting physician.  No radiographic interpretation.    Medications: I have reviewed the patient's current medications.  Assessment/Plan:  1. 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  Gemcitabine held 11/02/2014 due to neutropenia.  Gemcitabine 11/09/2014 dose reduced 800 mg/m.  Gemcitabine held 11/16/2014 due to neutropenia.  Gemcitabine resumed 11/23/2014 every 2 week schedule.  Cycle 6 gemcitabine 01/05/2015  Cycle 7 gemcitabine 01/18/2015  Cycle 8 gemcitabine 02/01/2015  Cycle 9 gemcitabine 02/15/2015  Cycle 10 gemcitabine 03/01/2015  Cycle 11 gemcitabine 03/15/2015  Cycle 12 gemcitabine 03/29/2015  Elevated CA 19-01 May 2016  CTs 06/17/2016-new soft tissue mass at the root of the mesentery with vascular involvement  PET 06/27/2016-hypermetabolic activity associated with soft tissue adjacent to surgical clips in the central mesentery  Status post SBRTto the mesenteric mass completed 07/26/2016  CT abdomen/pelvis 08/23/2016 -mesenteric mass stable to slightly  decreased in size.  Cycle 1 FOLFOX 09/03/2016  Cycle 2 FOLFOX 09/30/2016  Cycle 3 FOLFOX 10/14/2016   Cycle 4 FOLFOX 11/05/2016 (5-FU bolus eliminated and 5-FU pump dose reduced)  Cycle 5 FOLFOX 11/19/2016  CT abdomen/pelvis 11/28/2016-decreased size of soft tissue at the small bowel mesentery, mild asymmetric soft tissue at the left vaginal cuff  Cycle 6 FOLFOX 12/10/2016 (5-FU infusion further reduced and oxaliplatin reduced)  Cycle 7 FOLFOX 12/31/2016  Cycle 8 FOLFOX 01/21/2017   CT 02/24/2017-slight decrease in size of the mesenteric mass, resolution of soft tissue fullness at the left vaginal cuff, no evidence of disease progression  CT abdomen/pelvis 06/20/2017-stable ill-defined soft tissue at the mesenteric root, mild increased asymmetry at the left vaginal cuff, no other evidence of disease progression  Elevated CA 19-9 09/11/2018  CT abdomen/pelvis 11/18/2017-no evidence of recurrent pancreas cancer, dilated afferentloop, intrahepatic biliary dilatation  PET scan 01/16/2018-low level hypermetabolic activity in the porta hepatis which appears to correspond with a mildly enlarged lymph node on MRI. No other suspicious nodal activity in the abdomen. Small right paratracheal and subcarinal lymph nodes with low-level hypermetabolic activity in the chest. No abnormal activity within the liver. Findings suspicious for obstruction at the right ureteropelvic junction. Asymmetric left oropharyngeal activity.  CT abdomen/pelvis 03/31/2018-significant increaseinright-sided retroperitoneal soft tissue which invades and encases the IVC, celiac trunk, SMA, duodenum and right renal pelvis. Interval progression of intrahepatic and common bile duct dilatation. New right-sided obstructive uropathy secondary to tumor invasion of the right renal pelvis.  Cycle 1 gemcitabine/Abraxane 04/08/2018  Cycle 2 gemcitabine/Abraxane 04/22/2018   CT 04/26/2018-slight decrease in tumor encasing the  duodenum, IVC, right renal pelvis, and celiac with slight decrease in hydroureteronephrosis  Cycle  3 gemcitabine/Abraxane 05/12/2018  Cycle 4 gemcitabine/Abraxane 05/26/2018  Cycle 5 gemcitabine/Abraxane 06/09/2018  Cycle 6 gemcitabine/Abraxane 06/23/2018  Cycle 7 gemcitabine/Abraxane 07/07/2018  Restaging CTs 07/27/2018-mild progression of retroperitoneal infiltrative tumor  CT 08/25/2018-new mild left hydroureteronephrosis, stable moderate right hydronephrosis, table soft tissue mass near the pancreas head   2. bile duct obstruction secondary to #1, status post an ERCP with stent placement 09/23/2015hypermetabolic soft tissue in the central mesentery, no other evidence of metastatic disease, stable mildly enlarged portal caval node  3. Admission with post ERCP pancreatitis 06/16/2014  4. History of abdominal pain secondary to #1  5. Pulmonary embolism diagnosed on a CT of the abdomen 09/16/2014  Negative lower extremity Dopplers 09/17/2014  6. Multiple orthopedic surgical procedures  7. Endometrial cancer,stage IA, grade 1 endometrioid adenocarcinoma, 18% myometrial invasion, no lymphovascular space involvement, negative washings  Status post robotic total hysterectomy and bilateral salpingo-oophorectomy 11/30/2010  Recurrent tumor left lateral vagina status post biopsy 11/24/2014 with pathology confirming adenocarcinoma with focal squamous differentiation consistent with endometrial adenocarcinoma  Staging CT scans 12/06/2014 with no evidence of local pancreatic cancer recurrence. Small fluid collection adjacent to the left adrenal gland. Severe hepatic steatosis. No evidence of local extension of endometrial carcinoma. Carcinoma not well-defined at the vaginal cuff. 5 mm right external iliac lymph node. 3.6 mm left external iliac lymph node  Brachytherapy initiated 12/22/2014, completed 01/19/2015  CT abdomen/pelvis 07/24/2015 revealed a 3 x 4 cm soft tissue  focus at the vaginal apex  PET scan 08/11/2015 revealed no mass at the vaginal apex and no evidence of metastatic disease  CT 11/28/2016-mild asymmetric soft tissue at the left vaginal cuff, resolved on CT 02/24/2017  CT abdomen/pelvis 06/20/2017-stable mild ill-defined soft tissue density in the mesenteric root. Stable mild portacaval lymphadenopathy and subcentimeter right retroperitoneal lymph nodes. Mild increased size of asymmetric soft tissue density involving the left vaginal cuff.  PET scan 07/19/2017-no suspicious hypermetabolic activity within the neck, chest, abdomen or pelvis. Specifically no evidence of residual hypermetabolic tumor in the surgical bed or at the vaginal cuff. Hypermetabolic activity in the lumbar spine at the level of the right L3-4 facet joint appears degenerative.  MRI abdomen 12/02/2017-focal area of abnormal signal in the caudate lobe of the liver-unclear etiology, dilation of the hepaticojejunostomy loop, bile ducts, and pancreatic duct-stricture formation versus recurrent tumor  8. History of atrial fibrillation-maintained on xarelto  9. Family history of multiple cancers-negative CancerNext gene panel  10. Prolonged nausea following the pancreaticoduodenectomy. Improved 10/26/2014.  11. Port-A-Cath placement 10/21/2014.  12. History of Neutropenia secondary to chemotherapy   13. Diarrhea. Question pancreatic insufficiency. Pancreatic enzyme replacement initiated 01/05/2015. Recurrent diarrhea following a course of antibiotics March 2017.  14. History of positional vertigo-resolved  15. 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 Neutropenia secondary to chemotherapy, G-CSF was added with cycle 2 gemcitabine/Abraxane  17. Delayed nausea and diarrhea following FOLFOX. Emend added with cycle 2.  Decadron prophylaxis added with cycle 3  18. Diarrhea 10/28/2016. Question related to chemotherapy. Negative C. difficile testing 10/31/2016.  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  20.Admission 12/02/2017 with Bacteroides bacteremia  Biliary obstruction documented on CT/MRI 12/02/2017  Upper endoscopy 12/05/2017 revealed angulation of the apparent limb precluding intubation  Status post placement of a biliary drain 12/05/2017, replaced 12/09/2017  Bile duct brushings 12/09/2017-negative for malignancy  Biliary drain cholangiogram 12/18/2017-there is patency of the biliary-enteric anastomosis with no contrast traverses  the afferentlimb into the jejunum  Jejunal stent placed 01/28/2018, brush biopsy negative for malignancy  Biliary drain removed 02/11/2018  21.Admission 12/29/2017 through 01/02/2018 with sepsis secondary to Klebsiella bacteremia/cholangitis. 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  22.Right pelvicaliectasis on the PET scan 01/16/2018 with perinephric soft tissue fullness, the right ureter is not dilated 23. Admission 04/28/2018 with nausea/vomiting, increased abdominal pain, and a headache  Blood culture from 04/28/2018+ for Klebsiella pneumoniae, completed outpatient course of ampicillin-sulbactam  Port-A-Cath removed 05/02/2018 24. Anemia secondary to chronic disease, bleeding,and chemotherapy 25.Fever, blood culture positive for staph epidermidis 06/24/2018-completed course of vancomycin;repeat blood culture on 07/07/2018 positive for Staphylococcus, methicillin-resistant. She completed a course of vancomycin.PICC removed 07/08/2018 26.  Acute renal failure 08/25/2018-ultrasound confirmed right greater than left hydronephrosis  CT 08/25/2018-unchanged right hydronephrosis, new left hydroureteronephrosis  Ms. Felber is admitted with  acute renal failure.  Renal failure appears to be related to obstructive nephropathy.  She is scheduled to undergo placement of ureter stents by Dr. Gloriann Loan today.  I discussed treatment options for the pancreas cancer with Ms. Julien Girt.  We discussed comfort/hospice care versus a trial of salvage systemic chemotherapy.  We will continue this discussion over the next few days.   Recommendations 1.  Management of acute renal failure per Dr. Gloriann Loan and the medical service 2.  Bowel regimen for constipation 3.  Continue narcotic analgesics for pain 4.  Keep Xarelto on hold 5.  I will see her 08/27/2018 to continue discussions regarding hospice versus a trial of salvage chemotherapy.  LOS: 1 day   Betsy Coder, MD   08/26/2018, 1:54 PM

## 2018-08-26 NOTE — Progress Notes (Signed)
Bilateral lower extremity venous duplex has been completed. Negative for DVT.  08/26/18 10:44 AM [name] RVT

## 2018-08-26 NOTE — Progress Notes (Addendum)
Initial Nutrition Assessment  DOCUMENTATION CODES:   Non-severe (moderate) malnutrition in context of chronic illness  INTERVENTION:  - Diet advancement as medically feasible. - Once diet advanced to at least FLD, will order [employer] Instant Breakfast BID.  - Will order daily multivitamin with minerals.   NUTRITION DIAGNOSIS:   Moderate Malnutrition related to chronic illness, catabolic illness, cancer and cancer related treatments as evidenced by mild fat depletion, mild muscle depletion, moderate muscle depletion.  GOAL:   Patient will meet greater than or equal to 90% of their needs  MONITOR:   Diet advancement, PO intake, Supplement acceptance, Weight trends, Labs  REASON FOR ASSESSMENT:   Malnutrition Screening Tool  ASSESSMENT:   72 y.o. female with h/o pancreatic cancer s/p Whipple and choledochojejunostomy, internal/external biliary drain on 12/05/2017, small bowel stent placement 01/2018, A. Fib, PE (2016). She was sent from [facility] as a direct admission on 12/3. On 12/3 patient went to see Dr. Learta Codding to discuss chemotherapy and port placement. Lab work showed acute renal failure, mild hyperkalemia, and serum Ca 5.3 mg/dL. Renal ultrasound showed bilateral hydronephrosis, R<L. Urology will consider stenting the morning.  BMI indicates normal weight. Patient ordered FLD 12/3 at 6:15 PM and then made NPO at midnight pending bilateral ureteral stent placement this afternoon. Patient reports abdominal pain, overall discomfort at the time of RD visit and also wanting to rest prior to this afternoon's surgery. RD visit brief to respect patient's desires.  Per chart review, patient was seen by [facility] RD on 10/1 and 10/15. Patient reports that since those visits her appetite has been fair to poor and that she has discomfort with most PO intakes. She states that she tolerates [employer] well and drinks 1-2 of them/day at home. Unable to obtain any further  nutrition-related information at this time and will attempt to do so at follow-up.  Per chart review, current weight is 129 lb. Weight was 134 lb on 9/3 indicating 5 lb weight loss (3.7% body weight) in the past 3 months. Not significant for time frame.   Medications reviewed; 1 packet Miralax BID, 1 tablet Senokot BID, 650 mg oral sodium bicarb/day, 30 g oral Kayexalate x1 dose 12/4. Labs reviewed; Na: 131 mmol/L, K: 5.4 mmol/L, BUN: 55 mg/dL, creatinine: 3.93 mg/dL, Ca: 7.9 mg/dL, Alk Phos elevated, GFR: 11 mL/min. IVF; NS @ 50 mL/hr.     NUTRITION - FOCUSED PHYSICAL EXAM:    Most Recent Value  Orbital Region  Mild depletion  Upper Arm Region  Moderate depletion  Thoracic and Lumbar Region  Unable to assess  Buccal Region  Mild depletion  Temple Region  Mild depletion  Clavicle Bone Region  Mild depletion  Clavicle and Acromion Bone Region  Moderate depletion  Scapular Bone Region  Unable to assess  Dorsal Hand  Mild depletion  Patellar Region  Unable to assess  Anterior Thigh Region  Unable to assess  Posterior Calf Region  Unable to assess  Edema (RD Assessment)  Unable to assess  Hair  Reviewed  Eyes  Reviewed  Mouth  Reviewed  Skin  Reviewed  Nails  Reviewed       Diet Order:   Diet Order            Diet NPO time specified  Diet effective midnight              EDUCATION NEEDS:   Not appropriate for education at this time  Skin:  Skin Assessment: Reviewed RN Assessment  Last  BM:  12/2 (the day PTA)  Height:   Ht Readings from Last 1 Encounters:  08/25/18 '5\' 3"'  (1.6 m)    Weight:   Wt Readings from Last 1 Encounters:  08/25/18 58.7 kg    Ideal Body Weight:  52.27 kg  BMI:  Body mass index is 22.9 kg/m.  Estimated Nutritional Needs:   Kcal:  1760-1940 kcal  Protein:  82-94 grams  Fluid:  >/= 1.8 L/day     Jarome Matin, MS, RD, LDN, [facility] Inpatient Clinical Dietitian Pager # [phone] After hours/weekend pager # [phone]

## 2018-08-26 NOTE — Progress Notes (Signed)
Pt states she has had 3 BMs this shift, 2 before Dulcolax suppository and 1 after. Pt states they are all "pencil thin" but in a "decent" amount. Pt states she feels like there's "something pushing" on her right side lower abdomen. MD Bell on floor and aware of pain, spoke with pt about surgery. Daughter at bedside.

## 2018-08-26 NOTE — Progress Notes (Signed)
PROGRESS NOTE    [name]  HAL:[id number] DOB: [birth year] DOA: 08/25/2018 PCP: Chesley Noon, MD    Brief Narrative: 72 year old with history of pancreatic cancer status post Whipple resection and choledochojejunostomy, internal/external biliary drain 12/05/2017, small bowel stent placement May 2019, paroxysmal A. fib with PE on Xarelto, presented to Dr. Ralph Leyden office to discuss chemotherapy options, when she was found to be in acute renal failure with creatinine at 3.7, hyperkalemia.  Renal ultrasound showed bilateral hydronephrosis.  Urology consulted and plan is to proceed with a stent placement bilaterally today.    Assessment & Plan:   Active Problems:   Adenocarcinoma of head of pancreas (HCC)   Chronic anticoagulation   Anemia   AF (paroxysmal atrial fibrillation) (HCC)   AKI (acute kidney injury) ([place])   Malnutrition of moderate degree    1-Acute kidney injury, hyperkalemia, metabolic acidosis, hydronephrosis: Started on IV fluid, Foley catheter. Receive a dose of lokelma.  Potassium is still elevated today we will repeat B-met.  Kayexalate ordered.  Underwent bilateral resting with placement by Dr. Gloriann Loan.  #2 lower extremity edema Ultrasound order Echocardiogram order  #3 pancreatic cancer, chronic cancer pain. Continue with MS Contin. Dr. Judeen Hammans will follow with.  Paroxysmal A. Fib: Continue with Toprol. Flecainide on hold.   Constipation; Dulcolax suppository ordered.  Anemia; deficiency Check anemia panel; with iron deficiency anemia Transfusion  RN Pressure Injury Documentation:    Malnutrition Type:  Nutrition Problem: Moderate Malnutrition Etiology: chronic illness, catabolic illness, cancer and cancer related treatments   Malnutrition Characteristics:  Signs/Symptoms: mild fat depletion, mild muscle depletion, moderate muscle depletion   Nutrition Interventions:  Interventions: MVI, Carn[employer] Instant Breakfast  Estimated body  mass index is 22.9 kg/m as calculated from the following:   Height as of this encounter: 5' 3" (1.6 m).   Weight as of this encounter: 58.7 kg.   DVT prophylaxis: Heparin Code Status: Full code Family Communication: Care discussed with patient Disposition Plan: For bilateral stent placement, continue with IV fluids.  Repeat renal function in the morning.  Consultants:   Urology  Oncology   Procedures:      Antimicrobials:   None   Subjective: Patient seen and examined.  She reported bilateral lower extremity edema last few days. She continued to have nausea  Objective: Vitals:   08/26/18 1430 08/26/18 1459 08/26/18 1459 08/26/18 1538  BP: (!) 143/90 (!) 147/87 (!) 147/87 (!) 151/83  Pulse: 67 69 69 68  Resp: _0 Temp:  98.6 F (37 C) 98.6 F (37 C) 98.1 F (36.7 C)  TempSrc:  Oral Oral Oral  SpO2: 99% 99% 99% 94%  Weight:      Height:        Intake/Output Summary (Last 24 hours) at 08/26/2018 1619 Last data filed at 08/26/2018 1348 Gross per 24 hour  Intake 696.19 ml  Output 800 ml  Net -103.81 ml   Filed Weights   08/25/18 1808  Weight: 58.7 kg    Examination:  General exam: Appears calm and comfortable  Respiratory system: Clear to auscultation. Respiratory effort normal. Cardiovascular system: S1 & S2 heard, RRR. No JVD, murmurs, rubs, gallops or clicks. No pedal edema. Gastrointestinal system: Abdomen is nondistended, soft and nontender. No organomegaly or masses felt. Normal bowel sounds heard. Central nervous system: Alert and oriented. No focal neurological deficits. Extremities: Symmetric 5 x 5 power. Skin: No rashes, lesions or ulcers Psychiatry: Judgement and insight appear normal. Mood & affect appropriate.  Data Reviewed: I have personally reviewed following labs and imaging studies  CBC: Recent Labs  Lab 08/25/18 1045 08/25/18 1948 08/26/18 0531  WBC 6.3 6.7 5.9  NEUTROABS 5.0  --  4.2  HGB 8.2* 7.8* 7.5*  HCT  25.1* 24.9* 23.9*  MCV 92.6 98.4 97.6  PLT 161 178 096   Basic Metabolic Panel: Recent Labs  Lab 08/25/18 1045 08/25/18 1948 08/26/18 0531  NA 132*  --  131*  K 5.3*  --  5.4*  CL 105  --  104  CO2 17*  --  18*  GLUCOSE 103*  --  84  BUN 47*  --  55*  CREATININE 3.73* 3.65* 3.93*  CALCIUM 8.3*  --  7.9*   GFR: Estimated Creatinine Clearance: 10.9 mL/min (A) (by C-G formula based on SCr of 3.93 mg/dL (H)). Liver Function Tests: Recent Labs  Lab 08/25/18 1045 08/26/18 0531  AST 33 26  ALT 24 21  ALKPHOS 684* 536*  BILITOT 0.8 1.1  PROT 7.0 6.1*  ALBUMIN 3.1* 3.1*   No results for input(s): LIPASE, AMYLASE in the last 168 hours. No results for input(s): AMMONIA in the last 168 hours. Coagulation Profile: No results for input(s): INR, PROTIME in the last 168 hours. Cardiac Enzymes: No results for input(s): CKTOTAL, CKMB, CKMBINDEX, TROPONINI in the last 168 hours. BNP (last 3 results) Recent Labs    12/19/17 1257  PROBNP 103   HbA1C: No results for input(s): HGBA1C in the last 72 hours. CBG: No results for input(s): GLUCAP in the last 168 hours. Lipid Profile: No results for input(s): CHOL, HDL, LDLCALC, TRIG, CHOLHDL, LDLDIRECT in the last 72 hours. Thyroid Function Tests: No results for input(s): TSH, T4TOTAL, FREET4, T3FREE, THYROIDAB in the last 72 hours. Anemia Panel: Recent Labs    08/26/18 1118  VITAMINB12 870  FOLATE 16.3  FERRITIN 109  TIBC 186*  IRON 19*  RETICCTPCT 0.8   Sepsis Labs: No results for input(s): PROCALCITON, LATICACIDVEN in the last 168 hours.  No results found for this or any previous visit (from the past 240 hour(s)).       Radiology Studies: Ct Abdomen Pelvis Wo Contrast  Result Date: 08/25/2018 CLINICAL DATA:  Bilateral hydronephrosis. Acute renal failure. Metastatic pancreatic carcinoma. EXAM: CT ABDOMEN AND PELVIS WITHOUT CONTRAST TECHNIQUE: Multidetector CT imaging of the abdomen and pelvis was performed following  the standard protocol without IV contrast. COMPARISON:  07/27/2018 FINDINGS: Lower chest: No acute findings. Hepatobiliary: No mass visualized on this unenhanced exam. Diffuse biliary ductal dilatation and pneumobilia show no significant change. An internal biliary wall stent is unchanged in position in the distal common bile duct and duodenum. Prior cholecystectomy noted. Pancreas: Diffuse pancreatic atrophy. Soft tissue opacity in the area of the pancreatic head and aortocaval space encasing the distal common bile duct stent measures 4.2 x 4.1 cm on image 35/2, without significant change in size since previous study. Spleen:  Within normal limits in size. Adrenals/Urinary tract: Moderate right hydronephrosis is stable. Mild left hydroureteronephrosis is new since prior study with tapering of the distal ureter at the level of the iliac vessels. No obstructing calculus or mass identified. Unremarkable unopacified urinary bladder. Stomach/Bowel: The stomach remains dilated, suspicious for gastric outlet obstruction. No evidence of small bowel or colonic dilatation. Large amount of stool is seen throughout the colon. Vascular/Lymphatic: Retroperitoneal paraaortic lymphadenopathy is again seen with largest area measuring 2.7 cm on image 41/2, also without significant change. No pelvic lymphadenopathy identified. Aortic atherosclerosis. No evidence  of abdominal aortic aneurysm. Reproductive:  No mass or other significant abnormality. Other: Increased diffuse mesenteric and body wall edema noted. New mild perihepatic ascites. Musculoskeletal: No suspicious bone lesions identified. Old L5 vertebral body compression fracture deformity again seen. IMPRESSION: New mild left hydroureteronephrosis. No ureteral calculus or other obstructing etiology apparent by CT. Stable moderate right hydronephrosis. Stable soft tissue mass in the area of the pancreatic head. No significant change in mild peripancreatic and retroperitoneal  lymphadenopathy. Stable diffuse biliary ductal dilatation with biliary wall stent in place. Mild increase in diffuse mesenteric and body wall edema, and minimal ascites. Large stool burden noted; recommend clinical correlation for possible constipation. Electronically Signed   By: Earle Gell M.D.   On: 08/25/2018 18:06   US Renal  Result Date: 08/25/2018 CLINICAL DATA:  Acute renal failure, history of pancreatic cancer. EXAM: RENAL / URINARY TRACT ULTRASOUND COMPLETE COMPARISON:  CT scan of July 27, 2018. FINDINGS: Right Kidney: Renal measurements: 10.2 x 6.1 x 4.7 cm = volume: 153 mL . Echogenicity within normal limits. No mass visualized. Moderate hydronephrosis is noted. Left Kidney: Renal measurements: 12.1 x 6.5 x 6.1 cm = volume: 250 mL. Echogenicity within normal limits. No mass visualized. Mild left hydronephrosis is noted. Bladder: Appears normal for degree of bladder distention. IMPRESSION: Bilateral hydronephrosis is noted, right greater than left. Electronically Signed   By: Marijo Conception, M.D.   On: 08/25/2018 15:03   Dg C-arm 1-60 Min-no Report  Result Date: 08/26/2018 Fluoroscopy was utilized by the requesting physician.  No radiographic interpretation.        Scheduled Meds: . bisacodyl  10 mg Rectal Once  . heparin  5,000 Units Subcutaneous Q8H  . metoprolol succinate  25 mg Oral Daily  . morphine  30 mg Oral Q12H  . polyethylene glycol  17 g Oral BID  . senna-docusate  1 tablet Oral BID  . sodium bicarbonate  650 mg Oral Daily  . sodium polystyrene  30 g Oral Once   Continuous Infusions: . sodium chloride 50 mL/hr at 08/26/18 0600     LOS: 1 day    Time spent: 35 minutes.     Elmarie Shiley, MD [facility] Pager [phone]  If 7PM-7AM, please contact night-coverage [email or web address] Password [email or web address] 08/26/2018, 4:19 PM
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
## 2018-08-26 NOTE — Transfer of Care (Signed)
Immediate Anesthesia Transfer of Care Note  Patient: [name]  Procedure(s) Performed: CYSTOSCOPY WITH RETROGRADE PYELOGRAM AND STENT PLACEMENT (Bilateral )  Patient Location: PACU  Anesthesia Type:General  Level of Consciousness: awake, alert  and oriented  Airway & Oxygen Therapy: Patient Spontanous Breathing and Patient connected to face mask oxygen  Post-op Assessment: Report given to RN and Post -op Vital signs reviewed and stable  Post vital signs: Reviewed  Last Vitals:  Vitals Value Taken Time  BP 103/57 08/26/2018  1:45 PM  Temp    Pulse 88 08/26/2018  1:46 PM  Resp 17 08/26/2018  1:46 PM  SpO2 97 % 08/26/2018  1:46 PM  Vitals shown include unvalidated device data.  Last Pain:  Vitals:   08/26/18 1128  TempSrc: Oral  PainSc:       Patients Stated Pain Goal: 4 (41/32/44 0102)  Complications: No apparent anesthesia complications

## 2018-08-27 ENCOUNTER — Encounter (HOSPITAL_COMMUNITY): Payer: Self-pay | Admitting: Urology

## 2018-08-27 ENCOUNTER — Other Ambulatory Visit: Payer: Self-pay | Admitting: *Deleted

## 2018-08-27 ENCOUNTER — Telehealth: Payer: Self-pay | Admitting: Oncology

## 2018-08-27 DIAGNOSIS — N1330 Unspecified hydronephrosis: Secondary | ICD-10-CM

## 2018-08-27 DIAGNOSIS — C250 Malignant neoplasm of head of pancreas: Secondary | ICD-10-CM

## 2018-08-27 LAB — BASIC METABOLIC PANEL
Anion gap: 11 (ref 5–15)
BUN: 56 mg/dL — ABNORMAL HIGH (ref 8–23)
CHLORIDE: 105 mmol/L (ref 98–111)
CO2: 18 mmol/L — ABNORMAL LOW (ref 22–32)
Calcium: 8.8 mg/dL — ABNORMAL LOW (ref 8.9–10.3)
Creatinine, Ser: 2.91 mg/dL — ABNORMAL HIGH (ref 0.44–1.00)
GFR calc Af Amer: 18 mL/min — ABNORMAL LOW (ref >60)
GFR calc non Af Amer: 16 mL/min — ABNORMAL LOW (ref >60)
Glucose, Bld: 146 mg/dL — ABNORMAL HIGH (ref 70–99)
POTASSIUM: 5.3 mmol/L — AB (ref 3.5–5.1)
Sodium: 134 mmol/L — ABNORMAL LOW (ref 135–145)

## 2018-08-27 LAB — BPAM RBC
Blood Product Expiration Date: 201912302359
ISSUE DATE / TIME: 201912041514
UNIT TYPE AND RH: 5100

## 2018-08-27 LAB — CBC
HCT: 32.7 % — ABNORMAL LOW (ref 36.0–46.0)
Hemoglobin: 10.2 g/dL — ABNORMAL LOW (ref 12.0–15.0)
MCH: 29.8 pg (ref 26.0–34.0)
MCHC: 31.2 g/dL (ref 30.0–36.0)
MCV: 95.6 fL (ref 80.0–100.0)
Platelets: 266 10*3/uL (ref 150–400)
RBC: 3.42 MIL/uL — AB (ref 3.87–5.11)
RDW: 15.6 % — ABNORMAL HIGH (ref 11.5–15.5)
WBC: 5.9 10*3/uL (ref 4.0–10.5)
nRBC: 0 % (ref 0.0–0.2)

## 2018-08-27 LAB — TYPE AND SCREEN
ABO/RH(D): O POS
Antibody Screen: NEGATIVE
UNIT DIVISION: 0

## 2018-08-27 LAB — URINE CULTURE: Culture: NO GROWTH

## 2018-08-27 MED ORDER — SODIUM BICARBONATE 650 MG PO TABS
650.0000 mg | ORAL_TABLET | Freq: Two times a day (BID) | ORAL | Status: DC
  Administered 2018-08-27 – 2018-08-28 (×3): 650 mg via ORAL
  Filled 2018-08-27 (×3): qty 1

## 2018-08-27 MED ORDER — SODIUM CHLORIDE 0.9 % IV SOLN
INTRAVENOUS | Status: DC
  Administered 2018-08-27 – 2018-08-28 (×2): via INTRAVENOUS

## 2018-08-27 MED ORDER — SODIUM POLYSTYRENE SULFONATE 15 GM/60ML PO SUSP
30.0000 g | Freq: Once | ORAL | Status: AC
  Administered 2018-08-27: 30 g via ORAL
  Filled 2018-08-27: qty 120

## 2018-08-27 NOTE — Progress Notes (Signed)
MD wants to see patient on 12/10 at 12:00 with stat Bmet prior. Message sent to scheduler pool.

## 2018-08-27 NOTE — Progress Notes (Signed)
PROGRESS NOTE    [name]  IWO:[id number] DOB: Nov 10, 1945 DOA: 08/25/2018 PCP: Chesley Noon, MD    Brief Narrative: 72 year old with history of pancreatic cancer status post Whipple resection and choledochojejunostomy, internal/external biliary drain 12/05/2017, small bowel stent placement May 2019, paroxysmal A. fib with PE on Xarelto, presented to Dr. Ralph Leyden office to discuss chemotherapy options, when she was found to be in acute renal failure with creatinine at 3.7, hyperkalemia.  Renal ultrasound showed bilateral hydronephrosis.  Urology consulted and proceed with a stent placement bilaterally 08/26/2018    Assessment & Plan:   Active Problems:   Adenocarcinoma of head of pancreas (HCC)   Chronic anticoagulation   Anemia   AF (paroxysmal atrial fibrillation) (HCC)   AKI (acute kidney injury) ([place])   Malnutrition of moderate degree    1-Acute kidney injury, hyperkalemia, metabolic acidosis, hydronephrosis: Started on IV fluid, Foley catheter. Receive a dose of lokelma.  Potassium is still elevated today we will repeat B-met.  Kayexalate ordered.  Underwent bilateral stent  placement by Dr. Gloriann Loan.  2 lower extremity edema Ultrasound negative for DVT  Echocardiogram order; normal EF  3 pancreatic cancer, chronic cancer pain. Continue with MS Contin. Dr. Judeen Hammans will follow with.  Paroxysmal A. Fib: Continue with Toprol. Flecainide on hold.   Constipation; Dulcolax suppository ordered. She has had multiples BM.   Anemia; deficiency Check anemia panel; consistent with iron deficiency anemia Improved post transfusion.   RN Pressure Injury Documentation:    Malnutrition Type:  Nutrition Problem: Moderate Malnutrition Etiology: chronic illness, catabolic illness, cancer and cancer related treatments   Malnutrition Characteristics:  Signs/Symptoms: mild fat depletion, mild muscle depletion, moderate muscle depletion   Nutrition  Interventions:  Interventions: MVI, Carnation Instant Breakfast  Estimated body mass index is 22.9 kg/m as calculated from the following:   Height as of this encounter: '5\' 3"'  (1.6 m).   Weight as of this encounter: 58.7 kg.   DVT prophylaxis: Heparin Code Status: Full code Family Communication: Care discussed with patient Disposition Plan: For bilateral stent placement, continue with IV fluids.  Repeat renal function in the morning.  Consultants:   Urology  Oncology   Procedures:      Antimicrobials:   None   Subjective: She has had multiples bowel movement.  She is considering not doing any more chemo. She will follow up with Dr [name].  Urine is bloody.   Objective: Vitals:   08/26/18 1538 08/26/18 1906 08/26/18 2034 08/27/18 0429  BP: (!) 151/83 (!) 164/86 (!) 151/92 (!) 140/100  Pulse: 68 69 66 (!) 50  Resp: '16 18 18 18  ' Temp: 98.1 F (36.7 C) 97.9 F (36.6 C) 98.4 F (36.9 C) 97.8 F (36.6 C)  TempSrc: Oral Oral Oral Oral  SpO2: 94% 100% 98% 100%  Weight:      Height:        Intake/Output Summary (Last 24 hours) at 08/27/2018 1124 Last data filed at 08/27/2018 1011 Gross per 24 hour  Intake 852 ml  Output 2675 ml  Net -1823 ml   Filed Weights   08/25/18 1808  Weight: 58.7 kg    Examination:  General exam: NAD Respiratory system: CTA Cardiovascular system: S 1, S 2  Gastrointestinal system:  BS present, soft, nt Central nervous system: alert, non focal.  Extremities: plus 1 edema.  Skin: No rashes.   Data Reviewed: I have personally reviewed following labs and imaging studies  CBC: Recent Labs  Lab 08/25/18 1045 08/25/18 1948  08/26/18 0531 08/27/18 0616  WBC 6.3 6.7 5.9 5.9  NEUTROABS 5.0  --  4.2  --   HGB 8.2* 7.8* 7.5* 10.2*  HCT 25.1* 24.9* 23.9* 32.7*  MCV 92.6 98.4 97.6 95.6  PLT 161 178 163 903   Basic Metabolic Panel: Recent Labs  Lab 08/25/18 1045 08/25/18 1948 08/26/18 0531 08/26/18 1559 08/27/18 0616   NA 132*  --  131* 133* 134*  K 5.3*  --  5.4* 5.1 5.3*  CL 105  --  104 105 105  CO2 17*  --  18* 15* 18*  GLUCOSE 103*  --  84 88 146*  BUN 47*  --  55* 55* 56*  CREATININE 3.73* 3.65* 3.93* 3.76* 2.91*  CALCIUM 8.3*  --  7.9* 8.0* 8.8*   GFR: Estimated Creatinine Clearance: 14.7 mL/min (A) (by C-G formula based on SCr of 2.91 mg/dL (H)). Liver Function Tests: Recent Labs  Lab 08/25/18 1045 08/26/18 0531  AST 33 26  ALT 24 21  ALKPHOS 684* 536*  BILITOT 0.8 1.1  PROT 7.0 6.1*  ALBUMIN 3.1* 3.1*   No results for input(s): LIPASE, AMYLASE in the last 168 hours. No results for input(s): AMMONIA in the last 168 hours. Coagulation Profile: No results for input(s): INR, PROTIME in the last 168 hours. Cardiac Enzymes: No results for input(s): CKTOTAL, CKMB, CKMBINDEX, TROPONINI in the last 168 hours. BNP (last 3 results) Recent Labs    12/19/17 1257  PROBNP 103   HbA1C: No results for input(s): HGBA1C in the last 72 hours. CBG: No results for input(s): GLUCAP in the last 168 hours. Lipid Profile: No results for input(s): CHOL, HDL, LDLCALC, TRIG, CHOLHDL, LDLDIRECT in the last 72 hours. Thyroid Function Tests: No results for input(s): TSH, T4TOTAL, FREET4, T3FREE, THYROIDAB in the last 72 hours. Anemia Panel: Recent Labs    08/26/18 1118  VITAMINB12 870  FOLATE 16.3  FERRITIN 109  TIBC 186*  IRON 19*  RETICCTPCT 0.8   Sepsis Labs: No results for input(s): PROCALCITON, LATICACIDVEN in the last 168 hours.  Recent Results (from the past 240 hour(s))  Culture, Urine     Status: None   Collection Time: 08/25/18  6:12 PM  Result Value Ref Range Status   Specimen Description   Final    URINE, RANDOM Performed at [facility], [place] [address], [place], [place] [address]    Special Requests   Final    NONE Performed at [facility], [place] [address], [place], [place] [address]    Culture   Final    NO GROWTH Performed at  [facility], [place] [address], [place],  [address]    Report Status 08/27/2018 FINAL  Final         Radiology Studies: Ct Abdomen Pelvis Wo Contrast  Result Date: 08/25/2018 CLINICAL DATA:  Bilateral hydronephrosis. Acute renal failure. Metastatic pancreatic carcinoma. EXAM: CT ABDOMEN AND PELVIS WITHOUT CONTRAST TECHNIQUE: Multidetector CT imaging of the abdomen and pelvis was performed following the standard protocol without IV contrast. COMPARISON:  07/27/2018 FINDINGS: Lower chest: No acute findings. Hepatobiliary: No mass visualized on this unenhanced exam. Diffuse biliary ductal dilatation and pneumobilia show no significant change. An internal biliary wall stent is unchanged in position in the distal common bile duct and duodenum. Prior cholecystectomy noted. Pancreas: Diffuse pancreatic atrophy. Soft tissue opacity in the area of the pancreatic head and aortocaval space encasing the distal common bile duct stent measures 4.2 x 4.1 cm  on image 35/2, without significant change in size since previous study. Spleen:  Within normal limits in size. Adrenals/Urinary tract: Moderate right hydronephrosis is stable. Mild left hydroureteronephrosis is new since prior study with tapering of the distal ureter at the level of the iliac vessels. No obstructing calculus or mass identified. Unremarkable unopacified urinary bladder. Stomach/Bowel: The stomach remains dilated, suspicious for gastric outlet obstruction. No evidence of small bowel or colonic dilatation. Large amount of stool is seen throughout the colon. Vascular/Lymphatic: Retroperitoneal paraaortic lymphadenopathy is again seen with largest area measuring 2.7 cm on image 41/2, also without significant change. No pelvic lymphadenopathy identified. Aortic atherosclerosis. No evidence of abdominal aortic aneurysm. Reproductive:  No mass or other significant abnormality. Other: Increased diffuse mesenteric and body wall edema noted. New  mild perihepatic ascites. Musculoskeletal: No suspicious bone lesions identified. Old L5 vertebral body compression fracture deformity again seen. IMPRESSION: New mild left hydroureteronephrosis. No ureteral calculus or other obstructing etiology apparent by CT. Stable moderate right hydronephrosis. Stable soft tissue mass in the area of the pancreatic head. No significant change in mild peripancreatic and retroperitoneal lymphadenopathy. Stable diffuse biliary ductal dilatation with biliary wall stent in place. Mild increase in diffuse mesenteric and body wall edema, and minimal ascites. Large stool burden noted; recommend clinical correlation for possible constipation. Electronically Signed   By: Earle Gell M.D.   On: 08/25/2018 18:06   US Renal  Result Date: 08/25/2018 CLINICAL DATA:  Acute renal failure, history of pancreatic cancer. EXAM: RENAL / URINARY TRACT ULTRASOUND COMPLETE COMPARISON:  CT scan of July 27, 2018. FINDINGS: Right Kidney: Renal measurements: 10.2 x 6.1 x 4.7 cm = volume: 153 mL . Echogenicity within normal limits. No mass visualized. Moderate hydronephrosis is noted. Left Kidney: Renal measurements: 12.1 x 6.5 x 6.1 cm = volume: 250 mL. Echogenicity within normal limits. No mass visualized. Mild left hydronephrosis is noted. Bladder: Appears normal for degree of bladder distention. IMPRESSION: Bilateral hydronephrosis is noted, right greater than left. Electronically Signed   By: Marijo Conception, M.D.   On: 08/25/2018 15:03   Dg C-arm 1-60 Min-no Report  Result Date: 08/26/2018 Fluoroscopy was utilized by the requesting physician.  No radiographic interpretation.   Vas [place] Lower Extremity Venous (dvt)  Result Date: 08/27/2018  Lower Venous Study Indications: Edema.  Performing Technologist: Oliver Hum RVT  Examination Guidelines: A complete evaluation includes B-mode imaging, spectral Doppler, color Doppler, and power Doppler as needed of all accessible portions of each  vessel. Bilateral testing is considered an integral part of a complete examination. Limited examinations for reoccurring indications may be performed as noted.  Right Venous Findings: +---------+---------------+---------+-----------+----------+-------+          CompressibilityPhasicitySpontaneityPropertiesSummary +---------+---------------+---------+-----------+----------+-------+ CFV      Full           Yes      Yes                          +---------+---------------+---------+-----------+----------+-------+ SFJ      Full                                                 +---------+---------------+---------+-----------+----------+-------+ FV Prox  Full                                                 +---------+---------------+---------+-----------+----------+-------+  FV Mid   Full                                                 +---------+---------------+---------+-----------+----------+-------+ FV DistalFull                                                 +---------+---------------+---------+-----------+----------+-------+ PFV      Full                                                 +---------+---------------+---------+-----------+----------+-------+ POP      Full           Yes      Yes                          +---------+---------------+---------+-----------+----------+-------+ PTV      Full                                                 +---------+---------------+---------+-----------+----------+-------+ PERO     Full                                                 +---------+---------------+---------+-----------+----------+-------+  Left Venous Findings: +---------+---------------+---------+-----------+----------+-------+          CompressibilityPhasicitySpontaneityPropertiesSummary +---------+---------------+---------+-----------+----------+-------+ CFV      Full           Yes      Yes                           +---------+---------------+---------+-----------+----------+-------+ SFJ      Full                                                 +---------+---------------+---------+-----------+----------+-------+ FV Prox  Full                                                 +---------+---------------+---------+-----------+----------+-------+ FV Mid   Full                                                 +---------+---------------+---------+-----------+----------+-------+ FV DistalFull                                                 +---------+---------------+---------+-----------+----------+-------+  PFV      Full                                                 +---------+---------------+---------+-----------+----------+-------+ POP      Full           Yes      Yes                          +---------+---------------+---------+-----------+----------+-------+ PTV      Full                                                 +---------+---------------+---------+-----------+----------+-------+ PERO     Full                                                 +---------+---------------+---------+-----------+----------+-------+    Summary: Right: There is no evidence of deep vein thrombosis in the lower extremity. No cystic structure found in the popliteal fossa. Left: There is no evidence of deep vein thrombosis in the lower extremity. No cystic structure found in the popliteal fossa.  *See table(s) above for measurements and observations.    Preliminary         Scheduled Meds: . heparin  5,000 Units Subcutaneous Q8H  . metoprolol succinate  25 mg Oral Daily  . morphine  30 mg Oral Q12H  . polyethylene glycol  17 g Oral BID  . senna-docusate  1 tablet Oral BID  . sodium bicarbonate  650 mg Oral BID  . sodium polystyrene  30 g Oral Once   Continuous Infusions:    LOS: 2 days    Time spent: 35 minutes.     Elmarie Shiley, MD [facility] Pager  [phone]  If 7PM-7AM, please contact night-coverage [email or web address] Password [email or web address] 08/27/2018, 11:24 AM

## 2018-08-27 NOTE — Telephone Encounter (Signed)
Scheduled apt per 12/5 sch message - left message for patient with appt date and time

## 2018-08-27 NOTE — Progress Notes (Addendum)
Urology Inpatient Progress Report  Pancreous Cancer Bilateral Hydronephrosis Hyperkalemia  Procedure(s): CYSTOSCOPY WITH RETROGRADE PYELOGRAM AND STENT PLACEMENT  1 Day Post-Op   Intv/Subj: Right-sided abdominal pain has improved a small amount but some persist in the right lower quadrant.  Creatinine has improved to 2.91 from 3.76 yesterday.  Hemoglobin is 10.2.  Having some hematuria but it appears to be improving compared to postoperative yesterday.  She has had great urine output.  Active Problems:   Adenocarcinoma of head of pancreas (HCC)   Chronic anticoagulation   Anemia   AF (paroxysmal atrial fibrillation) (HCC)   AKI (acute kidney injury) ([place])   Malnutrition of moderate degree  Current Facility-Administered Medications  Medication Dose Route Frequency Provider Last Rate Last Dose  . 0.9 %  sodium chloride infusion   Intravenous Continuous Regalado, Belkys A, MD 75 mL/hr at 08/27/18 1825    . albuterol (PROVENTIL) (2.5 MG/3ML) 0.083% nebulizer solution 2.5 mg  2.5 mg Inhalation Q6H PRN Florencia Reasons, MD      . diphenhydrAMINE (BENADRYL) capsule 25 mg  25 mg Oral Daily PRN Florencia Reasons, MD      . fluticasone (FLONASE) 50 MCG/ACT nasal spray 1 spray  1 spray Each Nare BID PRN Florencia Reasons, MD      . heparin injection 5,000 Units  5,000 Units Subcutaneous Camelia Phenes Florencia Reasons, MD   5,000 Units at 08/27/18 1459  . HYDROcodone-acetaminophen (NORCO/VICODIN) 5-325 MG per tablet 1-2 tablet  1-2 tablet Oral Q4H PRN Florencia Reasons, MD      . metoprolol succinate (TOPROL-XL) 24 hr tablet 25 mg  25 mg Oral Daily Florencia Reasons, MD   25 mg at 08/27/18 0901  . morphine (MS CONTIN) 12 hr tablet 30 mg  30 mg Oral Q12H Florencia Reasons, MD   30 mg at 08/27/18 0901  . morphine (MSIR) tablet 15 mg  15 mg Oral Q4H PRN Florencia Reasons, MD   15 mg at 08/26/18 1738  . polyethylene glycol (MIRALAX / GLYCOLAX) packet 17 g  17 g Oral BID Florencia Reasons, MD   17 g at 08/26/18 2133  . promethazine (PHENERGAN) tablet 12.5 mg  12.5 mg Oral Q6H PRN Florencia Reasons, MD      . senna-docusate (Senokot-S) tablet 1 tablet  1 tablet Oral BID Florencia Reasons, MD   1 tablet at 08/27/18 0901  . sodium bicarbonate tablet 650 mg  650 mg Oral BID Regalado, Belkys A, MD   650 mg at 08/27/18 0901  . sodium polystyrene (KAYEXALATE) 15 GM/60ML suspension 30 g  30 g Oral Once Regalado, Belkys A, MD      . zolpidem (AMBIEN) tablet 5 mg  5 mg Oral QHS PRN Schorr, Rhetta Mura, NP   5 mg at 08/26/18 2138   Facility-Administered Medications Ordered in Other Encounters  Medication Dose Route Frequency Provider Last Rate Last Dose  . sodium chloride 0.9 % bolus 1,000 mL  1,000 mL Intravenous Once Coralie Keens, MD      . sodium chloride 0.9 % injection 10 mL  10 mL Intravenous PRN Ladell Pier, MD   10 mL at 09/04/16 0757     Objective: Vital: Vitals:   08/26/18 2034 08/27/18 0429 08/27/18 1232 08/27/18 1711  BP: (!) 151/92 (!) 140/100 138/73   Pulse: 66 (!) 50 (!) 49 66  Resp: 18 18    Temp: 98.4 F (36.9 C) 97.8 F (36.6 C)    TempSrc: Oral Oral    SpO2: 98% 100% 100%  Weight:      Height:       I/Os: I/O last 3 completed shifts: In: 1128.2 [I.V.:596.2; Blood:432; IV Piggyback:100] Out: 2250 [Urine:2200; Blood:50]  Physical Exam:  General: Patient is in no apparent distress Lungs: Normal respiratory effort, chest expands symmetrically. GI: The abdomen is soft and nontender without mass. Foley: Draining light red urine Ext: lower extremities symmetric  Lab Results: Recent Labs    08/25/18 1948 08/26/18 0531 08/27/18 0616  WBC 6.7 5.9 5.9  HGB 7.8* 7.5* 10.2*  HCT 24.9* 23.9* 32.7*   Recent Labs    08/26/18 0531 08/26/18 1559 08/27/18 0616  NA 131* 133* 134*  K 5.4* 5.1 5.3*  CL 104 105 105  CO2 18* 15* 18*  GLUCOSE 84 88 146*  BUN 55* 55* 56*  CREATININE 3.93* 3.76* 2.91*  CALCIUM 7.9* 8.0* 8.8*   No results for input(s): LABPT, INR in the last 72 hours. No results for input(s): LABURIN in the last 72 hours. Results for orders  placed or performed during the hospital encounter of 08/25/18  Culture, Urine     Status: None   Collection Time: 08/25/18  6:12 PM  Result Value Ref Range Status   Specimen Description   Final    URINE, RANDOM Performed at [facility] [address], [place], [place] [address]    Special Requests   Final    NONE Performed at [facility], [place] [address] [place], [place] [address]    Culture   Final    NO GROWTH Performed at [facility], [place] [address] [place], [place] [address]    Report Status 08/27/2018 FINAL  Final   *Note: Due to a large number of results and/or encounters for the requested time period, some results have not been displayed. A complete set of results can be found in Results Review.    Studies/Results: Dg C-arm 1-60 Min-no Report  Result Date: 08/26/2018 Fluoroscopy was utilized by the requesting physician.  No radiographic interpretation.   Vas [place] Lower Extremity Venous (dvt)  Result Date: 08/27/2018  Lower Venous Study Indications: Edema.  Performing Technologist: Oliver Hum RVT  Examination Guidelines: A complete evaluation includes B-mode imaging, spectral Doppler, color Doppler, and power Doppler as needed of all accessible portions of each vessel. Bilateral testing is considered an integral part of a complete examination. Limited examinations for reoccurring indications may be performed as noted.  Right Venous Findings: +---------+---------------+---------+-----------+----------+-------+          CompressibilityPhasicitySpontaneityPropertiesSummary +---------+---------------+---------+-----------+----------+-------+ CFV      Full           Yes      Yes                          +---------+---------------+---------+-----------+----------+-------+ SFJ      Full                                                 +---------+---------------+---------+-----------+----------+-------+ FV Prox  Full                                                  +---------+---------------+---------+-----------+----------+-------+ FV Mid   Full                                                 +---------+---------------+---------+-----------+----------+-------+  FV DistalFull                                                 +---------+---------------+---------+-----------+----------+-------+ PFV      Full                                                 +---------+---------------+---------+-----------+----------+-------+ POP      Full           Yes      Yes                          +---------+---------------+---------+-----------+----------+-------+ PTV      Full                                                 +---------+---------------+---------+-----------+----------+-------+ PERO     Full                                                 +---------+---------------+---------+-----------+----------+-------+  Left Venous Findings: +---------+---------------+---------+-----------+----------+-------+          CompressibilityPhasicitySpontaneityPropertiesSummary +---------+---------------+---------+-----------+----------+-------+ CFV      Full           Yes      Yes                          +---------+---------------+---------+-----------+----------+-------+ SFJ      Full                                                 +---------+---------------+---------+-----------+----------+-------+ FV Prox  Full                                                 +---------+---------------+---------+-----------+----------+-------+ FV Mid   Full                                                 +---------+---------------+---------+-----------+----------+-------+ FV DistalFull                                                 +---------+---------------+---------+-----------+----------+-------+ PFV      Full                                                  +---------+---------------+---------+-----------+----------+-------+  POP      Full           Yes      Yes                          +---------+---------------+---------+-----------+----------+-------+ PTV      Full                                                 +---------+---------------+---------+-----------+----------+-------+ PERO     Full                                                 +---------+---------------+---------+-----------+----------+-------+    Summary: Right: There is no evidence of deep vein thrombosis in the lower extremity. No cystic structure found in the popliteal fossa. Left: There is no evidence of deep vein thrombosis in the lower extremity. No cystic structure found in the popliteal fossa.  *See table(s) above for measurements and observations. Electronically signed by Monica Martinez MD on 08/27/2018 at 3:47:47 PM.    Final     Assessment: Bilateral hydronephrosis Acute renal insufficiency secondary to obstruction Gross hematuria Metastatic pancreatic cancer  Procedure(s): CYSTOSCOPY WITH BILATERAL RETROGRADE PYELOGRAM AND STENT PLACEMENT, 1 Day Post-Op  doing well.  Plan: Discontinue Foley catheter in the morning.  As long as she voids and creatinine has improved, okay from my standpoint to discharge.  Hematuria should continue to improve over time.  I did inform her that she may have some intermittent gross hematuria.  I will plan to see her back in 3 to 4 weeks in clinic.  If she is having significant discomfort from the stent especially on the right, I will plan to remove that stent.  However, if she is doing well with it I will keep both stents.  The left side should remain regardless.   Link Snuffer, MD Urology 08/27/2018, 6:39 PM

## 2018-08-28 LAB — BASIC METABOLIC PANEL
Anion gap: 7 (ref 5–15)
BUN: 40 mg/dL — ABNORMAL HIGH (ref 8–23)
CO2: 20 mmol/L — ABNORMAL LOW (ref 22–32)
Calcium: 7.9 mg/dL — ABNORMAL LOW (ref 8.9–10.3)
Chloride: 109 mmol/L (ref 98–111)
Creatinine, Ser: 1.77 mg/dL — ABNORMAL HIGH (ref 0.44–1.00)
GFR calc Af Amer: 33 mL/min — ABNORMAL LOW (ref >60)
GFR calc non Af Amer: 28 mL/min — ABNORMAL LOW (ref >60)
Glucose, Bld: 97 mg/dL (ref 70–99)
Potassium: 4.1 mmol/L (ref 3.5–5.1)
SODIUM: 136 mmol/L (ref 135–145)

## 2018-08-28 LAB — HEMOGLOBIN AND HEMATOCRIT, BLOOD
HCT: 26.3 % — ABNORMAL LOW (ref 36.0–46.0)
HEMOGLOBIN: 8.3 g/dL — AB (ref 12.0–15.0)

## 2018-08-28 LAB — CBC
HCT: 24.8 % — ABNORMAL LOW (ref 36.0–46.0)
Hemoglobin: 7.9 g/dL — ABNORMAL LOW (ref 12.0–15.0)
MCH: 29.7 pg (ref 26.0–34.0)
MCHC: 31.9 g/dL (ref 30.0–36.0)
MCV: 93.2 fL (ref 80.0–100.0)
Platelets: 209 10*3/uL (ref 150–400)
RBC: 2.66 MIL/uL — ABNORMAL LOW (ref 3.87–5.11)
RDW: 15.5 % (ref 11.5–15.5)
WBC: 4.6 10*3/uL (ref 4.0–10.5)
nRBC: 0 % (ref 0.0–0.2)

## 2018-08-28 MED ORDER — FERROUS SULFATE 325 (65 FE) MG PO TABS
325.0000 mg | ORAL_TABLET | Freq: Two times a day (BID) | ORAL | Status: DC
  Administered 2018-08-28 (×2): 325 mg via ORAL
  Filled 2018-08-28 (×2): qty 1

## 2018-08-28 MED ORDER — SODIUM BICARBONATE 650 MG PO TABS: 650.0000 mg | ORAL_TABLET | Freq: Two times a day (BID) | ORAL | 0 refills | Status: DC

## 2018-08-28 MED ORDER — SODIUM CHLORIDE 0.9 % IV SOLN
510.0000 mg | Freq: Once | INTRAVENOUS | Status: AC
  Administered 2018-08-28: 510 mg via INTRAVENOUS
  Filled 2018-08-28: qty 17

## 2018-08-28 MED ORDER — FERROUS SULFATE 325 (65 FE) MG PO TABS: 325.0000 mg | ORAL_TABLET | Freq: Two times a day (BID) | ORAL | 3 refills | Status: DC

## 2018-08-28 MED ORDER — ZOLPIDEM TARTRATE 5 MG PO TABS: 5.0000 mg | ORAL_TABLET | Freq: Every evening | ORAL | Status: DC | PRN

## 2018-08-28 NOTE — Discharge Instructions (Signed)
Hold Xarelto  for now, follow up with PCP, cardiology to check Hb and to determine resumption of Xarelto.

## 2018-08-28 NOTE — Discharge Summary (Signed)
Physician Discharge Summary  [name] GUR:[id number] DOB: 1945-11-22 DOA: 08/25/2018  PCP: Savannah Noon, MD  Admit date: 08/25/2018 Discharge date: 08/28/2018  Admitted From: Home  Disposition:  Home   Recommendations for Outpatient Follow-up:  1. Follow up with PCP in 1-2 weeks 2. Please obtain BMP/CBC in one week 3. Follow hb 4. Consider resuming xarelto if hemoglobin  e is stable and hematuria has resolved.    Discharge Condition: Stable. ) CODE STATUS: full code.  Diet recommendation: Heart Healthy   Brief/Interim Summary: Brief Narrative: 72 year old with history of pancreatic cancer status post Whipple resection and choledochojejunostomy, internal/external biliary drain 12/05/2017, small bowel stent placement May 2019, paroxysmal A. fib with PE on Xarelto, presented to [name] office to discuss chemotherapy options, when she was found to be in acute renal failure with creatinine at 3.7, hyperkalemia.  Renal ultrasound showed bilateral hydronephrosis.  Urology consulted and proceed with a stent placement bilaterally 08/26/2018    Assessment & Plan:   Active Problems:   Adenocarcinoma of head of pancreas (HCC)   Chronic anticoagulation   Anemia   AF (paroxysmal atrial fibrillation) (HCC)   AKI (acute kidney injury) ([place])   Malnutrition of moderate degree    1-Acute kidney injury, hyperkalemia, metabolic acidosis, hydronephrosis: Started on IV fluid, Foley catheter. Receive a dose of lokelma.  Potassium is still elevated today we will repeat B-met.  Kayexalate ordered.  Underwent bilateral stent  placement by [name]. renal function improved. Hematuria improved.  Hb is stable. I don't think hb value of 10 was accurate, patient only received one unit.  Repeated hb in the afternoon is stable.  Discussed with Dr [name], hold xarelto for now due to anemia and hematuria.   2 lower extremity edema Ultrasound negative for DVT  Echocardiogram order; normal  EF TED hose.   3 pancreatic cancer, chronic cancer [name]. Continue with MS Contin. [name] following,.  Patient has decided not to proceed with further chemo  Paroxysmal A. Fib: Continue with Toprol. Flecainide , will resume at discharge. Renal function improved.   Constipation; Dulcolax suppository ordered. She has had multiples BM.   Anemia; deficiency Check anemia panel; consistent with iron deficiency anemia Will give IV iron prior to discharge,  Repeated hb stable at 8.   Moderate malnutrition; supplement.  Related to malignancy  Discharge Diagnoses:  Active Problems:   Adenocarcinoma of head of pancreas (HCC)   Chronic anticoagulation   Anemia   AF (paroxysmal atrial fibrillation) (HCC)   AKI (acute kidney injury) ([place])   Malnutrition of moderate degree    Discharge Instructions  Discharge Instructions    Diet - low sodium heart healthy   Complete by:  As directed    Diet - low sodium heart healthy   Complete by:  As directed    Increase activity slowly   Complete by:  As directed    Increase activity slowly   Complete by:  As directed      Allergies as of 08/28/2018      Reactions   Ace Inhibitors Cough   Florastor Kids [saccharomyces Boulardii] Other (See Comments)   PROBIOTICS in particular ones such as florastor can cause fungemia in case of former or bacterremia in patients esp ones such as [name] who are immunosuppressed. Furthermore there is no compelling evidence that they reduce C difficile or abx associated diarrhea   Scopolamine Other (See Comments)   Dizzy, "lost control of my body", fell down and cracked a  rib   Sulfa Antibiotics Hives      Medication List    STOP taking these medications   lidocaine-prilocaine cream Commonly known as:  EMLA   rivaroxaban 20 MG Tabs tablet Commonly known as:  XARELTO   sodium polystyrene 15 GM/60ML suspension Commonly known as:  KAYEXALATE     TAKE these medications   acetaminophen  500 MG tablet Commonly known as:  TYLENOL Take 1,000 mg by mouth every 6 (six) hours as needed for [name].   albuterol 108 (90 Base) MCG/ACT inhaler Commonly known as:  PROVENTIL HFA;VENTOLIN HFA Inhale 2 puffs into the lungs every 6 (six) hours as needed for wheezing or shortness of breath.   ferrous sulfate 325 (65 FE) MG tablet Take 1 tablet (325 mg total) by mouth 2 (two) times daily with a meal.   flecainide 100 MG tablet Commonly known as:  TAMBOCOR Take 1 tablet (100 mg total) by mouth 2 (two) times daily.   fluticasone 50 MCG/ACT nasal spray Commonly known as:  FLONASE Place 1 spray into both nostrils 2 (two) times daily as needed for allergies.   HYDROcodone-acetaminophen 5-325 MG tablet Commonly known as:  NORCO/VICODIN Take 1-2 tablets by mouth every 4 (four) hours as needed for moderate [name].   metoprolol succinate 25 MG 24 hr tablet Commonly known as:  TOPROL-XL Take 25 mg by mouth daily.   morphine 15 MG tablet Commonly known as:  MSIR Take 1 tablet (15 mg total) by mouth every 4 (four) hours as needed for severe [name].   morphine 30 MG 12 hr tablet Commonly known as:  MS CONTIN Take 1 tablet (30 mg total) by mouth every 12 (twelve) hours.   polyethylene glycol packet Commonly known as:  MIRALAX / GLYCOLAX Take 17 g by mouth 2 (two) times daily. Mix in 8 oz water and drink   promethazine 12.5 MG tablet Commonly known as:  PHENERGAN Take 1 tablet (12.5 mg total) by mouth every 6 (six) hours as needed for nausea or vomiting.   senna-docusate 8.6-50 MG tablet Commonly known as:  Senokot-S Take 2 tablets by mouth 2 (two) times daily.   sodium bicarbonate 650 MG tablet Take 1 tablet (650 mg total) by mouth 2 (two) times daily.   zolpidem 6.25 MG CR tablet Commonly known as:  AMBIEN CR Take 1 tablet (6.25 mg total) by mouth at bedtime.      Follow-up Information    Savannah Noon, MD Follow up in 1 week(s).   Specialty:  Family Medicine Contact  information: [place] [address] [phone]        Savannah Pain, MD .   Specialty:  Cardiology Contact information: [phone] [place]  [address] [phone]          Allergies  Allergen Reactions  . Ace Inhibitors Cough  . Florastor Kids [Saccharomyces Boulardii] Other (See Comments)    PROBIOTICS in particular ones such as florastor can cause fungemia in case of former or bacterremia in patients esp ones such as Savannah [place]hill who are immunosuppressed. Furthermore there is no compelling evidence that they reduce C difficile or abx associated diarrhea  . Scopolamine Other (See Comments)    Dizzy, "lost control of my body", fell down and cracked a rib  . Sulfa Antibiotics Hives    Consultations:  Urology   Dr [name].    Procedures/Studies: Ct Abdomen Pelvis Wo Contrast  Result Date: 08/25/2018 CLINICAL DATA:  Bilateral hydronephrosis.  Acute renal failure. Metastatic pancreatic carcinoma. EXAM: CT ABDOMEN AND PELVIS WITHOUT CONTRAST TECHNIQUE: Multidetector CT imaging of the abdomen and pelvis was performed following the standard protocol without IV contrast. COMPARISON:  07/27/2018 FINDINGS: Lower chest: No acute findings. Hepatobiliary: No mass visualized on this unenhanced exam. Diffuse biliary ductal dilatation and pneumobilia show no significant change. An internal biliary wall stent is unchanged in position in the distal common bile duct and duodenum. Prior cholecystectomy noted. Pancreas: Diffuse pancreatic atrophy. Soft tissue opacity in the area of the pancreatic head and aortocaval space encasing the distal common bile duct stent measures 4.2 x 4.1 cm on image 35/2, without significant change in size since previous study. Spleen:  Within normal limits in size. Adrenals/Urinary tract: Moderate right hydronephrosis is stable. Mild left hydroureteronephrosis is new since prior study with tapering of the distal ureter at the  level of the iliac vessels. No obstructing calculus or mass identified. Unremarkable unopacified urinary bladder. Stomach/Bowel: The stomach remains dilated, suspicious for gastric outlet obstruction. No evidence of small bowel or colonic dilatation. Large amount of stool is seen throughout the colon. Vascular/Lymphatic: Retroperitoneal paraaortic lymphadenopathy is again seen with largest area measuring 2.7 cm on image 41/2, also without significant change. No pelvic lymphadenopathy identified. Aortic atherosclerosis. No evidence of abdominal aortic aneurysm. Reproductive:  No mass or other significant abnormality. Other: Increased diffuse mesenteric and body wall edema noted. New mild perihepatic ascites. Musculoskeletal: No suspicious bone lesions identified. Old L5 vertebral body compression fracture deformity again seen. IMPRESSION: New mild left hydroureteronephrosis. No ureteral calculus or other obstructing etiology apparent by CT. Stable moderate right hydronephrosis. Stable soft tissue mass in the area of the pancreatic head. No significant change in mild peripancreatic and retroperitoneal lymphadenopathy. Stable diffuse biliary ductal dilatation with biliary wall stent in place. Mild increase in diffuse mesenteric and body wall edema, and minimal ascites. Large stool burden noted; recommend clinical correlation for possible constipation. Electronically Signed   By: Earle Gell M.D.   On: 08/25/2018 18:06   US Renal  Result Date: 08/25/2018 CLINICAL DATA:  Acute renal failure, history of pancreatic cancer. EXAM: RENAL / URINARY TRACT ULTRASOUND COMPLETE COMPARISON:  CT scan of July 27, 2018. FINDINGS: Right Kidney: Renal measurements: 10.2 x 6.1 x 4.7 cm = volume: 153 mL . Echogenicity within normal limits. No mass visualized. Moderate hydronephrosis is noted. Left Kidney: Renal measurements: 12.1 x 6.5 x 6.1 cm = volume: 250 mL. Echogenicity within normal limits. No mass visualized. Mild left  hydronephrosis is noted. Bladder: Appears normal for degree of bladder distention. IMPRESSION: Bilateral hydronephrosis is noted, right greater than left. Electronically Signed   By: Marijo Conception, M.D.   On: 08/25/2018 15:03   Dg C-arm 1-60 Min-no Report  Result Date: 08/26/2018 Fluoroscopy was utilized by the requesting physician.  No radiographic interpretation.   Mm 3d Screen Breast Bilateral  Result Date: 08/07/2018 CLINICAL DATA:  Screening. EXAM: DIGITAL SCREENING BILATERAL MAMMOGRAM WITH TOMO AND CAD COMPARISON:  Previous exam(s). ACR Breast Density Category b: There are scattered areas of fibroglandular density. FINDINGS: There are no findings suspicious for malignancy. Images were processed with CAD. IMPRESSION: No mammographic evidence of malignancy. A result letter of this screening mammogram will be mailed directly to the patient. RECOMMENDATION: Screening mammogram in one year. (Code:SM-B-01Y) BI-RADS CATEGORY  1: Negative. Electronically Signed   By: Lillia Mountain M.D.   On: 08/07/2018 10:10   Vas [place] Lower Extremity Venous (dvt)  Result Date: 08/27/2018  Lower Venous  Study Indications: Edema.  Performing Technologist: Oliver Hum RVT  Examination Guidelines: A complete evaluation includes B-mode imaging, spectral Doppler, color Doppler, and power Doppler as needed of all accessible portions of each vessel. Bilateral testing is considered an integral part of a complete examination. Limited examinations for reoccurring indications may be performed as noted.  Right Venous Findings: +---------+---------------+---------+-----------+----------+-------+          CompressibilityPhasicitySpontaneityPropertiesSummary +---------+---------------+---------+-----------+----------+-------+ CFV      Full           Yes      Yes                          +---------+---------------+---------+-----------+----------+-------+ SFJ      Full                                                  +---------+---------------+---------+-----------+----------+-------+ FV Prox  Full                                                 +---------+---------------+---------+-----------+----------+-------+ FV Mid   Full                                                 +---------+---------------+---------+-----------+----------+-------+ FV DistalFull                                                 +---------+---------------+---------+-----------+----------+-------+ PFV      Full                                                 +---------+---------------+---------+-----------+----------+-------+ POP      Full           Yes      Yes                          +---------+---------------+---------+-----------+----------+-------+ PTV      Full                                                 +---------+---------------+---------+-----------+----------+-------+ PERO     Full                                                 +---------+---------------+---------+-----------+----------+-------+  Left Venous Findings: +---------+---------------+---------+-----------+----------+-------+          CompressibilityPhasicitySpontaneityPropertiesSummary +---------+---------------+---------+-----------+----------+-------+ CFV      Full           Yes      Yes                          +---------+---------------+---------+-----------+----------+-------+  SFJ      Full                                                 +---------+---------------+---------+-----------+----------+-------+ FV Prox  Full                                                 +---------+---------------+---------+-----------+----------+-------+ FV Mid   Full                                                 +---------+---------------+---------+-----------+----------+-------+ FV DistalFull                                                  +---------+---------------+---------+-----------+----------+-------+ PFV      Full                                                 +---------+---------------+---------+-----------+----------+-------+ POP      Full           Yes      Yes                          +---------+---------------+---------+-----------+----------+-------+ PTV      Full                                                 +---------+---------------+---------+-----------+----------+-------+ PERO     Full                                                 +---------+---------------+---------+-----------+----------+-------+    Summary: Right: There is no evidence of deep vein thrombosis in the lower extremity. No cystic structure found in the popliteal fossa. Left: There is no evidence of deep vein thrombosis in the lower extremity. No cystic structure found in the popliteal fossa.  *See table(s) above for measurements and observations. Electronically signed by Monica Martinez MD on 08/27/2018 at 3:47:47 PM.    Final     Subjective: Feeling well. Urine still bloody   Discharge Exam: Vitals:   08/28/18 0419 08/28/18 1438  BP: 110/80 135/82  Pulse: 64 (!) 57  Resp: 18 18  Temp: 97.8 F (36.6 C) 98.2 F (36.8 C)  SpO2: 100% 100%   Vitals:   08/27/18 1711 08/27/18 2031 08/28/18 0419 08/28/18 1438  BP:  137/79 110/80 135/82  Pulse: 66 (!) 56 64 (!) 57  Resp:  _0 Temp:  98.2 F (36.8 C) 97.8 F (36.6 C) 98.2 F (36.8 C)  TempSrc:  Oral Oral   SpO2:  100% 100% 100%  Weight:      Height:        General: Pt is alert, awake, not in acute distress Cardiovascular: RRR, S1/S2 +, no rubs, no gallops Respiratory: CTA bilaterally, no wheezing, no rhonchi Abdominal: Soft, NT, ND, bowel sounds + Extremities: no edema, no cyanosis    The results of significant diagnostics from this hospitalization (including imaging, microbiology, ancillary and laboratory) are listed below for reference.      Microbiology: Recent Results (from the past 240 hour(s))  Culture, Urine     Status: None   Collection Time: 08/25/18  6:12 PM  Result Value Ref Range Status   Specimen Description   Final    URINE, RANDOM Performed at [facility] [address], [place], [place] [address]    Special Requests   Final    NONE Performed at [facility], [place] [address], [place], [place] [address]    Culture   Final    NO GROWTH Performed at [facility], [place] [address], [place], [place] [address]    Report Status 08/27/2018 FINAL  Final     Labs: BNP (last 3 results) No results for input(s): BNP in the last 8760 hours. Basic Metabolic Panel: Recent Labs  Lab 08/25/18 1045 08/25/18 1948 08/26/18 0531 08/26/18 1559 08/27/18 0616 08/28/18 1022  NA 132*  --  131* 133* 134* 136  K 5.3*  --  5.4* 5.1 5.3* 4.1  CL 105  --  104 105 105 109  CO2 17*  --  18* 15* 18* 20*  GLUCOSE 103*  --  84 88 146* 97  BUN 47*  --  55* 55* 56* 40*  CREATININE 3.73* 3.65* 3.93* 3.76* 2.91* 1.77*  CALCIUM 8.3*  --  7.9* 8.0* 8.8* 7.9*   Liver Function Tests: Recent Labs  Lab 08/25/18 1045 08/26/18 0531  AST 33 26  ALT 24 21  ALKPHOS 684* 536*  BILITOT 0.8 1.1  PROT 7.0 6.1*  ALBUMIN 3.1* 3.1*   No results for input(s): LIPASE, AMYLASE in the last 168 hours. No results for input(s): AMMONIA in the last 168 hours. CBC: Recent Labs  Lab 08/25/18 1045 08/25/18 1948 08/26/18 0531 08/27/18 0616 08/28/18 0655 08/28/18 1329  WBC 6.3 6.7 5.9 5.9 4.6  --   NEUTROABS 5.0  --  4.2  --   --   --   HGB 8.2* 7.8* 7.5* 10.2* 7.9* 8.3*  HCT 25.1* 24.9* 23.9* 32.7* 24.8* 26.3*  MCV 92.6 98.4 97.6 95.6 93.2  --   PLT 161 178 163 266 209  --    Cardiac Enzymes: No results for input(s): CKTOTAL, CKMB, CKMBINDEX, TROPONINI in the last 168 hours. BNP: Invalid input(s): POCBNP CBG: No results for input(s): GLUCAP in the last 168 hours. D-Dimer No results for  input(s): DDIMER in the last 72 hours. Hgb A1c No results for input(s): HGBA1C in the last 72 hours. Lipid Profile No results for input(s): CHOL, HDL, LDLCALC, TRIG, CHOLHDL, LDLDIRECT in the last 72 hours. Thyroid function studies No results for input(s): TSH, T4TOTAL, T3FREE, THYROIDAB in the last 72 hours.  Invalid input(s): FREET3 Anemia work up Recent Labs    08/26/18 1118  VITAMINB12 870  FOLATE 16.3  FERRITIN 109  TIBC 186*  IRON 19*  RETICCTPCT 0.8   Urinalysis    Component Value Date/Time   COLORURINE YELLOW 08/25/2018 1812   APPEARANCEUR HAZY (A) 08/25/2018 1812  LABSPEC 1.009 08/25/2018 1812   LABSPEC 1.005 04/21/2014 1713   PHURINE 5.0 08/25/2018 1812   GLUCOSEU NEGATIVE 08/25/2018 1812   GLUCOSEU Negative 04/21/2014 1713   HGBUR MODERATE (A) 08/25/2018 1812   BILIRUBINUR NEGATIVE 08/25/2018 1812   BILIRUBINUR Negative 04/21/2014 1713   KETONESUR NEGATIVE 08/25/2018 1812   PROTEINUR NEGATIVE 08/25/2018 1812   UROBILINOGEN 0.2 10/16/2014 1035   UROBILINOGEN 0.2 04/21/2014 1713   NITRITE NEGATIVE 08/25/2018 1812   LEUKOCYTESUR TRACE (A) 08/25/2018 1812   LEUKOCYTESUR Trace 04/21/2014 1713   Sepsis Labs Invalid input(s): PROCALCITONIN,  WBC,  LACTICIDVEN Microbiology Recent Results (from the past 240 hour(s))  Culture, Urine     Status: None   Collection Time: 08/25/18  6:12 PM  Result Value Ref Range Status   Specimen Description   Final    URINE, RANDOM Performed at [facility], [place] [address], [place], [place] [address]    Special Requests   Final    NONE Performed at [facility], [place] [address], [place], [place] [address]    Culture   Final    NO GROWTH Performed at [facility], [place] [address] [place], [place] [address]    Report Status 08/27/2018 FINAL  Final     Time coordinating discharge: 35 minutes.   SIGNED:   Elmarie Shiley, MD  [facility] 08/28/2018, 7:34 PM Pager    If 7PM-7AM, please contact night-coverage [email or web address] Password [email or web address]

## 2018-08-28 NOTE — Care Management Important Message (Signed)
Important Message  Patient Details  Name: [name] MRN: [id number] Date of Birth: 04/18/1946   Medicare Important Message Given:  Yes    Kerin Salen 08/28/2018, 10:43 AMImportant Message  Patient Details  Name: [name] MRN: [id number] Date of Birth: 20-Oct-1945   Medicare Important Message Given:  Yes    Kerin Salen 08/28/2018, 10:43 AM

## 2018-08-28 NOTE — Progress Notes (Signed)
Patient verbalized understanding of discharge instructions. Patient is stable at discharge. 

## 2018-08-28 NOTE — Progress Notes (Signed)
Foley catheter removed at 0400 per order. Pt reported voiding blood tinged urine at 0510 am.

## 2018-08-28 NOTE — Progress Notes (Signed)
Urology Inpatient Progress Report  Pancreous Cancer Bilateral Hydronephrosis Hyperkalemia  Procedure(s): CYSTOSCOPY WITH RETROGRADE PYELOGRAM AND STENT PLACEMENT  2 Days Post-Op   Intv/Subj: No acute events overnight. Patient is without complaint.  no recheck today of renal function Hemoglobin decreased to 7.9 this morning. The Foley was removed this morning.  She is voiding.  She states that her urine is still bloody but is even clearer than yesterday.  Seems to be improving.  Active Problems:   Adenocarcinoma of head of pancreas (HCC)   Chronic anticoagulation   Anemia   AF (paroxysmal atrial fibrillation) (HCC)   AKI (acute kidney injury) ([place])   Malnutrition of moderate degree  Current Facility-Administered Medications  Medication Dose Route Frequency Provider Last Rate Last Dose  . 0.9 %  sodium chloride infusion   Intravenous Continuous Regalado, Belkys A, MD 75 mL/hr at 08/27/18 1825    . albuterol (PROVENTIL) (2.5 MG/3ML) 0.083% nebulizer solution 2.5 mg  2.5 mg Inhalation Q6H PRN Florencia Reasons, MD      . diphenhydrAMINE (BENADRYL) capsule 25 mg  25 mg Oral Daily PRN Florencia Reasons, MD      . fluticasone (FLONASE) 50 MCG/ACT nasal spray 1 spray  1 spray Each Nare BID PRN Florencia Reasons, MD      . heparin injection 5,000 Units  5,000 Units Subcutaneous Camelia Phenes Florencia Reasons, MD   5,000 Units at 08/28/18 0429  . HYDROcodone-acetaminophen (NORCO/VICODIN) 5-325 MG per tablet 1-2 tablet  1-2 tablet Oral Q4H PRN Florencia Reasons, MD   1 tablet at 08/28/18 0426  . metoprolol succinate (TOPROL-XL) 24 hr tablet 25 mg  25 mg Oral Daily Florencia Reasons, MD   25 mg at 08/27/18 0901  . morphine (MS CONTIN) 12 hr tablet 30 mg  30 mg Oral Q12H Florencia Reasons, MD   30 mg at 08/27/18 2031  . morphine (MSIR) tablet 15 mg  15 mg Oral Q4H PRN Florencia Reasons, MD   15 mg at 08/26/18 1738  . polyethylene glycol (MIRALAX / GLYCOLAX) packet 17 g  17 g Oral BID Florencia Reasons, MD   17 g at 08/26/18 2133  . promethazine (PHENERGAN) tablet 12.5 mg  12.5 mg  Oral Q6H PRN Florencia Reasons, MD      . senna-docusate (Senokot-S) tablet 1 tablet  1 tablet Oral BID Florencia Reasons, MD   1 tablet at 08/27/18 0901  . sodium bicarbonate tablet 650 mg  650 mg Oral BID Regalado, Belkys A, MD   650 mg at 08/27/18 2031  . sodium polystyrene (KAYEXALATE) 15 GM/60ML suspension 30 g  30 g Oral Once Regalado, Belkys A, MD      . zolpidem (AMBIEN) tablet 5 mg  5 mg Oral QHS PRN Wofford, Drew A, RPH       Facility-Administered Medications Ordered in Other Encounters  Medication Dose Route Frequency Provider Last Rate Last Dose  . sodium chloride 0.9 % bolus 1,000 mL  1,000 mL Intravenous Once Coralie Keens, MD      . sodium chloride 0.9 % injection 10 mL  10 mL Intravenous PRN Ladell Pier, MD   10 mL at 09/04/16 0757     Objective: Vital: Vitals:   08/27/18 1232 08/27/18 1711 08/27/18 2031 08/28/18 0419  BP: 138/73  137/79 110/80  Pulse: (!) 49 66 (!) 56 64  Resp:   18 18  Temp:   98.2 F (36.8 C) 97.8 F (36.6 C)  TempSrc:   Oral Oral  SpO2: 100%  100% 100%  Weight:      Height:       I/Os: I/O last 3 completed shifts: In: 120 [P.O.:120] Out: 2375 [Urine:2375]  Physical Exam:  General: Patient is in no apparent distress Lungs: Normal respiratory effort, chest expands symmetrically. GI: The abdomen is soft and nontender without mass. Ext: lower extremities symmetric  Lab Results: Recent Labs    08/26/18 0531 08/27/18 0616 08/28/18 0655  WBC 5.9 5.9 4.6  HGB 7.5* 10.2* 7.9*  HCT 23.9* 32.7* 24.8*   Recent Labs    08/26/18 0531 08/26/18 1559 08/27/18 0616  NA 131* 133* 134*  K 5.4* 5.1 5.3*  CL 104 105 105  CO2 18* 15* 18*  GLUCOSE 84 88 146*  BUN 55* 55* 56*  CREATININE 3.93* 3.76* 2.91*  CALCIUM 7.9* 8.0* 8.8*   No results for input(s): LABPT, INR in the last 72 hours. No results for input(s): LABURIN in the last 72 hours. Results for orders placed or performed during the hospital encounter of 08/25/18  Culture, Urine     Status:  None   Collection Time: 08/25/18  6:12 PM  Result Value Ref Range Status   Specimen Description   Final    URINE, RANDOM Performed at [facility] [address] [place], [place] [address]    Special Requests   Final    NONE Performed at [facility], [place] [address] [place], [place] [address]    Culture   Final    NO GROWTH Performed at [facility], [place] [address], [place], [place] [address]    Report Status 08/27/2018 FINAL  Final   *Note: Due to a large number of results and/or encounters for the requested time period, some results have not been displayed. A complete set of results can be found in Results Review.    Studies/Results: Dg C-arm 1-60 Min-no Report  Result Date: 08/26/2018 Fluoroscopy was utilized by the requesting physician.  No radiographic interpretation.   Vas [place] Lower Extremity Venous (dvt)  Result Date: 08/27/2018  Lower Venous Study Indications: Edema.  Performing Technologist: Oliver Hum RVT  Examination Guidelines: A complete evaluation includes B-mode imaging, spectral Doppler, color Doppler, and power Doppler as needed of all accessible portions of each vessel. Bilateral testing is considered an integral part of a complete examination. Limited examinations for reoccurring indications may be performed as noted.  Right Venous Findings: +---------+---------------+---------+-----------+----------+-------+          CompressibilityPhasicitySpontaneityPropertiesSummary +---------+---------------+---------+-----------+----------+-------+ CFV      Full           Yes      Yes                          +---------+---------------+---------+-----------+----------+-------+ SFJ      Full                                                 +---------+---------------+---------+-----------+----------+-------+ FV Prox  Full                                                  +---------+---------------+---------+-----------+----------+-------+ FV Mid   Full                                                 +---------+---------------+---------+-----------+----------+-------+  FV DistalFull                                                 +---------+---------------+---------+-----------+----------+-------+ PFV      Full                                                 +---------+---------------+---------+-----------+----------+-------+ POP      Full           Yes      Yes                          +---------+---------------+---------+-----------+----------+-------+ PTV      Full                                                 +---------+---------------+---------+-----------+----------+-------+ PERO     Full                                                 +---------+---------------+---------+-----------+----------+-------+  Left Venous Findings: +---------+---------------+---------+-----------+----------+-------+          CompressibilityPhasicitySpontaneityPropertiesSummary +---------+---------------+---------+-----------+----------+-------+ CFV      Full           Yes      Yes                          +---------+---------------+---------+-----------+----------+-------+ SFJ      Full                                                 +---------+---------------+---------+-----------+----------+-------+ FV Prox  Full                                                 +---------+---------------+---------+-----------+----------+-------+ FV Mid   Full                                                 +---------+---------------+---------+-----------+----------+-------+ FV DistalFull                                                 +---------+---------------+---------+-----------+----------+-------+ PFV      Full                                                  +---------+---------------+---------+-----------+----------+-------+  POP      Full           Yes      Yes                          +---------+---------------+---------+-----------+----------+-------+ PTV      Full                                                 +---------+---------------+---------+-----------+----------+-------+ PERO     Full                                                 +---------+---------------+---------+-----------+----------+-------+    Summary: Right: There is no evidence of deep vein thrombosis in the lower extremity. No cystic structure found in the popliteal fossa. Left: There is no evidence of deep vein thrombosis in the lower extremity. No cystic structure found in the popliteal fossa.  *See table(s) above for measurements and observations. Electronically signed by Monica Martinez MD on 08/27/2018 at 3:47:47 PM.    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  Plan: ccontinue bilateral stents.  Again, if she returns to clinic with significant pain on the right or significant urinary complaints, I would recommend removing the right stent but do not recommend removing the left.  Consider rechecking hemoglobin this afternoon if discharge is being considered   Link Snuffer, MD Urology 08/28/2018, 9:22 AM

## 2018-08-28 NOTE — Anesthesia Postprocedure Evaluation (Signed)
Anesthesia Post Note  Patient: [name]  Procedure(s) Performed: CYSTOSCOPY WITH RETROGRADE PYELOGRAM AND STENT PLACEMENT (Bilateral )     Patient location during evaluation: PACU Anesthesia Type: General Level of consciousness: awake and alert Pain management: pain level controlled Vital Signs Assessment: post-procedure vital signs reviewed and stable Respiratory status: spontaneous breathing, nonlabored ventilation, respiratory function stable and patient connected to nasal cannula oxygen Cardiovascular status: blood pressure returned to baseline and stable Postop Assessment: no apparent nausea or vomiting Anesthetic complications: no    Last Vitals:  Vitals:   08/27/18 2031 08/28/18 0419  BP: 137/79 110/80  Pulse: (!) 56 64  Resp: 18 18  Temp: 36.8 C 36.6 C  SpO2: 100% 100%    Last Pain:  Vitals:   08/28/18 0530  TempSrc:   PainSc: Asleep                 Taler Kushner L Foster Sonnier

## 2018-09-01 ENCOUNTER — Other Ambulatory Visit: Payer: Self-pay | Admitting: *Deleted

## 2018-09-01 ENCOUNTER — Encounter: Payer: Self-pay | Admitting: Cardiology

## 2018-09-01 ENCOUNTER — Telehealth: Payer: Self-pay | Admitting: Oncology

## 2018-09-01 ENCOUNTER — Inpatient Hospital Stay (HOSPITAL_BASED_OUTPATIENT_CLINIC_OR_DEPARTMENT_OTHER): Payer: Medicare Other | Admitting: Oncology

## 2018-09-01 ENCOUNTER — Inpatient Hospital Stay: Payer: Medicare Other

## 2018-09-01 ENCOUNTER — Ambulatory Visit: Payer: Medicare Other | Admitting: Cardiology

## 2018-09-01 VITALS — BP 110/60 | HR 58 | Ht 63.0 in | Wt 119.0 lb

## 2018-09-01 DIAGNOSIS — G893 Neoplasm related pain (acute) (chronic): Secondary | ICD-10-CM | POA: Diagnosis not present

## 2018-09-01 DIAGNOSIS — I10 Essential (primary) hypertension: Secondary | ICD-10-CM

## 2018-09-01 DIAGNOSIS — I480 Paroxysmal atrial fibrillation: Secondary | ICD-10-CM

## 2018-09-01 DIAGNOSIS — R509 Fever, unspecified: Secondary | ICD-10-CM

## 2018-09-01 DIAGNOSIS — K5900 Constipation, unspecified: Secondary | ICD-10-CM | POA: Diagnosis not present

## 2018-09-01 DIAGNOSIS — Z9221 Personal history of antineoplastic chemotherapy: Secondary | ICD-10-CM | POA: Diagnosis not present

## 2018-09-01 DIAGNOSIS — C7889 Secondary malignant neoplasm of other digestive organs: Secondary | ICD-10-CM

## 2018-09-01 DIAGNOSIS — K831 Obstruction of bile duct: Secondary | ICD-10-CM | POA: Diagnosis not present

## 2018-09-01 DIAGNOSIS — Z923 Personal history of irradiation: Secondary | ICD-10-CM | POA: Diagnosis not present

## 2018-09-01 DIAGNOSIS — R110 Nausea: Secondary | ICD-10-CM | POA: Diagnosis not present

## 2018-09-01 DIAGNOSIS — R001 Bradycardia, unspecified: Secondary | ICD-10-CM

## 2018-09-01 DIAGNOSIS — C250 Malignant neoplasm of head of pancreas: Secondary | ICD-10-CM

## 2018-09-01 DIAGNOSIS — E875 Hyperkalemia: Secondary | ICD-10-CM | POA: Diagnosis not present

## 2018-09-01 DIAGNOSIS — N1330 Unspecified hydronephrosis: Secondary | ICD-10-CM

## 2018-09-01 LAB — BASIC METABOLIC PANEL - CANCER CENTER ONLY
Anion gap: 7 (ref 5–15)
BUN: 10 mg/dL (ref 8–23)
CHLORIDE: 103 mmol/L (ref 98–111)
CO2: 26 mmol/L (ref 22–32)
Calcium: 8.5 mg/dL — ABNORMAL LOW (ref 8.9–10.3)
Creatinine: 1.03 mg/dL — ABNORMAL HIGH (ref 0.44–1.00)
GFR, Est AFR Am: >60 mL/min (ref >60)
GFR, Estimated: 55 mL/min — ABNORMAL LOW (ref >60)
Glucose, Bld: 153 mg/dL — ABNORMAL HIGH (ref 70–99)
Potassium: 4.1 mmol/L (ref 3.5–5.1)
Sodium: 136 mmol/L (ref 135–145)

## 2018-09-01 LAB — CBC WITH DIFFERENTIAL (CANCER CENTER ONLY)
Abs Immature Granulocytes: 0.05 10*3/uL (ref 0.00–0.07)
BASOS ABS: 0 10*3/uL (ref 0.0–0.1)
Basophils Relative: 0 %
Eosinophils Absolute: 0.2 10*3/uL (ref 0.0–0.5)
Eosinophils Relative: 4 %
HCT: 27.6 % — ABNORMAL LOW (ref 36.0–46.0)
Hemoglobin: 9 g/dL — ABNORMAL LOW (ref 12.0–15.0)
Immature Granulocytes: 1 %
Lymphocytes Relative: 14 %
Lymphs Abs: 0.6 10*3/uL — ABNORMAL LOW (ref 0.7–4.0)
MCH: 30.1 pg (ref 26.0–34.0)
MCHC: 32.6 g/dL (ref 30.0–36.0)
MCV: 92.3 fL (ref 80.0–100.0)
Monocytes Absolute: 0.3 10*3/uL (ref 0.1–1.0)
Monocytes Relative: 7 %
NEUTROS ABS: 3 10*3/uL (ref 1.7–7.7)
Neutrophils Relative %: 74 %
Platelet Count: 164 10*3/uL (ref 150–400)
RBC: 2.99 MIL/uL — ABNORMAL LOW (ref 3.87–5.11)
RDW: 15.2 % (ref 11.5–15.5)
WBC Count: 4 10*3/uL (ref 4.0–10.5)
nRBC: 0 % (ref 0.0–0.2)

## 2018-09-01 MED ORDER — PROMETHAZINE HCL 12.5 MG PO TABS: 12.5000 mg | ORAL_TABLET | Freq: Four times a day (QID) | ORAL | 1 refills | Status: DC | PRN

## 2018-09-01 NOTE — Telephone Encounter (Signed)
Printed calendar and avs. °
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
## 2018-09-01 NOTE — Progress Notes (Signed)
[name] OFFICE PROGRESS NOTE   Diagnosis: Pancreas cancer  INTERVAL HISTORY:   [name] was discharged in the hospital 08/28/2018 after admission with acute renal failure secondary to bilateral hydronephrosis.  Bilateral ureter stents were placed by [name].  She reports feeling better.  Pain is controlled with MS Contin and MSIR.  She also takes hydrocodone intermittently.  She is here today with [name] and [name].  She had a temperature of 101.8 degrees and chills last night.  No fever today.  Objective:  Vital signs in last 24 hours:  Blood pressure 121/84, pulse (!) 59, temperature 98.9 F (37.2 C), temperature source Oral, resp. rate 18, height 5\' 3"  (1.6 m), weight 118 lb 3.2 oz (53.6 kg), SpO2 100 %.    HEENT: No thrush or ulcers Resp: Lungs clear bilaterally Cardio: Regular rate and rhythm GI: Tender in the upper abdomen, no mass, no hepatomegaly Vascular: Trace lower leg edema bilaterally   Lab Results:  Lab Results  Component Value Date   WBC 4.0 09/01/2018   HGB 9.0 (L) 09/01/2018   HCT 27.6 (L) 09/01/2018   MCV 92.3 09/01/2018   PLT 164 09/01/2018   NEUTROABS 3.0 09/01/2018    CMP  Lab Results  Component Value Date   NA 136 09/01/2018   K 4.1 09/01/2018   CL 103 09/01/2018   CO2 26 09/01/2018   GLUCOSE 153 (H) 09/01/2018   BUN 10 09/01/2018   CREATININE 1.03 (H) 09/01/2018   CALCIUM 8.5 (L) 09/01/2018   PROT 6.1 (L) 08/26/2018   ALBUMIN 3.1 (L) 08/26/2018   AST 26 08/26/2018   ALT 21 08/26/2018   ALKPHOS 536 (H) 08/26/2018   BILITOT 1.1 08/26/2018   GFRNONAA 55 (L) 09/01/2018   GFRAA >60 09/01/2018    Medications: I have reviewed the patient's current medications.   Assessment/Plan:  1. 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  Gemcitabine held 11/02/2014 due to neutropenia.  Gemcitabine 11/09/2014 dose reduced 800 mg/m.  Gemcitabine held 11/16/2014 due to neutropenia.  Gemcitabine resumed 11/23/2014 every 2 week schedule.  Cycle 6 gemcitabine 01/05/2015  Cycle 7 gemcitabine 01/18/2015  Cycle 8 gemcitabine 02/01/2015  Cycle 9 gemcitabine 02/15/2015  Cycle 10 gemcitabine 03/01/2015  Cycle 11 gemcitabine 03/15/2015  Cycle 12 gemcitabine 03/29/2015  Elevated CA 19-01 May 2016  CTs 06/17/2016-new soft tissue mass at the root of the mesentery with vascular involvement  PET 06/27/2016-hypermetabolic activity associated with soft tissue adjacent to surgical clips in the central mesentery  Status post SBRTto the mesenteric mass completed 07/26/2016  CT abdomen/pelvis 08/23/2016 -mesenteric mass stable to slightly decreased in size.  Cycle 1 FOLFOX 09/03/2016  Cycle 2 FOLFOX 09/30/2016  Cycle 3 FOLFOX 10/14/2016   Cycle 4 FOLFOX 11/05/2016 (5-FU bolus eliminated and 5-FU pump dose reduced)  Cycle 5 FOLFOX 11/19/2016  CT abdomen/pelvis 11/28/2016-decreased size of soft tissue at the small bowel mesentery, mild asymmetric soft tissue at the left vaginal cuff  Cycle 6 FOLFOX 12/10/2016 (5-FU infusion further reduced and oxaliplatin reduced)  Cycle 7 FOLFOX 12/31/2016  Cycle 8 FOLFOX 01/21/2017   CT 02/24/2017-slight decrease in size of the mesenteric mass, resolution of soft tissue fullness at the left vaginal cuff, no evidence of disease  progression  CT abdomen/pelvis 06/20/2017-stable ill-defined soft tissue at the mesenteric root, mild increased asymmetry at the left vaginal cuff, no other evidence of disease progression  Elevated CA 19-9 09/11/2018  CT abdomen/pelvis 11/18/2017-no evidence of recurrent pancreas cancer, dilated  afferentloop, intrahepatic biliary dilatation  PET scan 01/16/2018-low level hypermetabolic activity in the porta hepatis which appears to correspond with a mildly enlarged lymph node on MRI. No other suspicious nodal activity in the abdomen. Small right paratracheal and subcarinal lymph nodes with low-level hypermetabolic activity in the chest. No abnormal activity within the liver. Findings suspicious for obstruction at the right ureteropelvic junction. Asymmetric left oropharyngeal activity.  CT abdomen/pelvis 03/31/2018-significant increaseinright-sided retroperitoneal soft tissue which invades and encases the IVC, celiac trunk, SMA, duodenum and right renal pelvis. Interval progression of intrahepatic and common bile duct dilatation. New right-sided obstructive uropathy secondary to tumor invasion of the right renal pelvis.  Cycle 1 gemcitabine/Abraxane 04/08/2018  Cycle 2 gemcitabine/Abraxane 04/22/2018   CT 04/26/2018-slight decrease in tumor encasing the duodenum, IVC, right renal pelvis, and celiac with slight decrease in hydroureteronephrosis  Cycle 3 gemcitabine/Abraxane 05/12/2018  Cycle 4 gemcitabine/Abraxane 05/26/2018  Cycle 5 gemcitabine/Abraxane 06/09/2018  Cycle 6 gemcitabine/Abraxane 06/23/2018  Cycle 7 gemcitabine/Abraxane 07/07/2018  Restaging CTs 07/27/2018-mild progression of retroperitoneal infiltrative tumor  CT 08/25/2018-new mild left hydroureteronephrosis, stable moderate right hydronephrosis, table soft tissue mass near the pancreas head   2. bile duct obstruction secondary to #1, status post an ERCP with stent placement 09/23/2015hypermetabolic soft tissue in the central mesentery, no other evidence of metastatic disease, stable mildly enlarged portal caval node  3. Admission with post ERCP pancreatitis 06/16/2014  4. History of abdominal pain secondary to #1  5. Pulmonary embolism diagnosed on a CT of the abdomen 09/16/2014  Negative lower  extremity Dopplers 09/17/2014  6. Multiple orthopedic surgical procedures  7. Endometrial cancer,stage IA, grade 1 endometrioid adenocarcinoma, 18% myometrial invasion, no lymphovascular space involvement, negative washings  Status post robotic total hysterectomy and bilateral salpingo-oophorectomy 11/30/2010  Recurrent tumor left lateral vagina status post biopsy 11/24/2014 with pathology confirming adenocarcinoma with focal squamous differentiation consistent with endometrial adenocarcinoma  Staging CT scans 12/06/2014 with no evidence of local pancreatic cancer recurrence. Small fluid collection adjacent to the left adrenal gland. Severe hepatic steatosis. No evidence of local extension of endometrial carcinoma. Carcinoma not well-defined at the vaginal cuff. 5 mm right external iliac lymph node. 3.6 mm left external iliac lymph node  Brachytherapy initiated 12/22/2014, completed 01/19/2015  CT abdomen/pelvis 07/24/2015 revealed a 3 x 4 cm soft tissue focus at the vaginal apex  PET scan 08/11/2015 revealed no mass at the vaginal apex and no evidence of metastatic disease  CT 11/28/2016-mild asymmetric soft tissue at the left vaginal cuff, resolved on CT 02/24/2017  CT abdomen/pelvis 06/20/2017-stable mild ill-defined soft tissue density in the mesenteric root. Stable mild portacaval lymphadenopathy and subcentimeter right retroperitoneal lymph nodes. Mild increased size of asymmetric soft tissue density involving the left vaginal cuff.  PET scan 07/19/2017-no suspicious hypermetabolic activity within the neck, chest, abdomen or pelvis. Specifically no evidence of residual hypermetabolic tumor in the surgical bed or at the vaginal cuff. Hypermetabolic activity in the lumbar spine at the level of the right L3-4 facet joint appears degenerative.  MRI abdomen 12/02/2017-focal area of abnormal signal in the caudate lobe of the liver-unclear etiology, dilation of the hepaticojejunostomy  loop, bile ducts, and pancreatic duct-stricture formation versus recurrent tumor  8. History of atrial fibrillation-maintained on xarelto  9. Family history of  multiple cancers-negative CancerNext gene panel  10. Prolonged nausea following the pancreaticoduodenectomy. Improved 10/26/2014.  11. Port-A-Cath placement 10/21/2014.  12. History of Neutropenia secondary to chemotherapy   13. Diarrhea. Question pancreatic insufficiency. Pancreatic enzyme replacement initiated 01/05/2015. Recurrent diarrhea following a course of antibiotics March 2017.  14. History of positional vertigo-resolved  15. 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 Neutropenia secondary to chemotherapy, G-CSF was added with cycle 2 gemcitabine/Abraxane  17. Delayed nausea and diarrhea following FOLFOX. Emend added with cycle 2. Decadron prophylaxis added with cycle 3  18. Diarrhea 10/28/2016. Question related to chemotherapy. Negative C. difficile testing 10/31/2016.  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  20.Admission 12/02/2017 with Bacteroides bacteremia  Biliary obstruction documented on CT/MRI 12/02/2017  Upper endoscopy 12/05/2017 revealed angulation of the apparent limb precluding intubation  Status post placement of a biliary drain 12/05/2017, replaced 12/09/2017  Bile duct brushings 12/09/2017-negative for malignancy  Biliary drain cholangiogram 12/18/2017-there is patency of the biliary-enteric anastomosis with no contrast traverses the afferentlimb into the jejunum  Jejunal stent placed 01/28/2018, brush biopsy negative for malignancy  Biliary drain removed 02/11/2018  21.Admission 12/29/2017 through 01/02/2018 with sepsis secondary to Klebsiella bacteremia/cholangitis. Cholangiogram 12/30/2017 showed high-grade  stenosis/stricture of the proximal draining jejunal loops.Internal/external biliary drainage catheter placed.  22.Right pelvicaliectasis on the PET scan 01/16/2018 with perinephric soft tissue fullness, the right ureter is not dilated 23. Admission 04/28/2018 with nausea/vomiting, increased abdominal pain, and a headache  Blood culture from 04/28/2018+ for Klebsiella pneumoniae, completed outpatient course of ampicillin-sulbactam  Port-A-Cath removed 05/02/2018 24. Anemia secondary to chronic disease, bleeding,and chemotherapy 25.Fever, blood culture positive for staph epidermidis 06/24/2018-completed course of vancomycin;repeat blood culture on 07/07/2018 positive for Staphylococcus, methicillin-resistant. She completed a course of vancomycin.PICC removed 07/08/2018 26.Acute renal failure 08/25/2018-ultrasound confirmed right greater than left hydronephrosis  CT 08/25/2018-unchanged right hydronephrosis, new left hydroureteronephrosis  Placement of bilateral ureter stents 08/26/2018   Disposition: [name] has an improved performance status compared to when she was admitted last week.  The renal function has normalized following placement of bilateral ureter stents.  [name] has static pancreas cancer.  She has decided against further systemic chemotherapy.  We discussed hospice care.  She agrees to enrollment in the [facility] hospice program.  We reviewed CPR and ACLS issues.  She will be placed on a no CODE BLUE status.  [name] had a high fever last night.  She has a history of intermittent bacteremia, likely related to the pancreas tumor and involvement of the biliary tract.  She will contact us for a recurrent high fever.  We will consider obtaining a blood culture and placing Savannah on chronic antibiotic therapy.Marland Kitchen   She will return for an office visit in 3-4 weeks.  She will contact us in the interim as needed.  25 minutes were spent with the patient today.  The  majority of the time was used for counseling and coordination of care.  Betsy Coder, MD  09/01/2018  4:26 PM

## 2018-09-01 NOTE — Patient Instructions (Signed)
Medication Instructions:  Please discontinue the Xarelto. Continue all other medications as listed.  If you need a refill on your cardiac medications before your next appointment, please call your pharmacy.   Follow-Up: At [facility], you and your health needs are our priority.  As part of our continuing mission to provide you with exceptional heart care, we have created designated Provider Care Teams.  These Care Teams include your primary Cardiologist (physician) and Advanced Practice Providers (APPs -  Physician Assistants and Nurse Practitioners) who all work together to provide you with the care you need, when you need it. You will need a follow up appointment in 6 months.  Please call our office 2 months in advance to schedule this appointment.  You may see Candee Furbish, MD or one of the following Advanced Practice Providers on your designated Care Team:   Truitt Merle, NP Cecilie Kicks, NP . Kathyrn Drown, NP  Thank you for choosing [facility]!!

## 2018-09-02 ENCOUNTER — Encounter: Payer: Self-pay | Admitting: *Deleted

## 2018-09-02 DIAGNOSIS — C250 Malignant neoplasm of head of pancreas: Secondary | ICD-10-CM

## 2018-09-02 NOTE — Progress Notes (Signed)
[facility]atinum referral to [phone] and confirmed receipt. Is being seen this afternoon.

## 2018-09-07 ENCOUNTER — Telehealth: Payer: Self-pay | Admitting: *Deleted

## 2018-09-07 MED ORDER — DIPHENOXYLATE-ATROPINE 2.5-0.025 MG PO TABS: 1.0000 | ORAL_TABLET | Freq: Four times a day (QID) | ORAL | 0 refills | Status: AC | PRN

## 2018-09-07 MED ORDER — DIPHENOXYLATE-ATROPINE 2.5-0.025 MG PO TABS: 1.0000 | ORAL_TABLET | Freq: Four times a day (QID) | ORAL | 0 refills | Status: DC | PRN

## 2018-09-07 NOTE — Telephone Encounter (Signed)
Had #13 loose stools on Saturday. Had leftover Lomotil at home and took it with good results. She has backed off her Miralax and Senna now. Asking for script for Lomotil.

## 2018-09-07 NOTE — Telephone Encounter (Signed)
MD approved Lomotil script. Hospice RN notified

## 2018-09-10 ENCOUNTER — Telehealth: Payer: Self-pay | Admitting: *Deleted

## 2018-09-10 MED ORDER — POLYETHYLENE GLYCOL 3350 17 G PO PACK: 17.0000 g | PACK | Freq: Two times a day (BID) | ORAL | 3 refills | Status: AC

## 2018-09-10 NOTE — Telephone Encounter (Signed)
Hospice RN called that patient needs a refill on her MiraLax 17 gram bid (Hospice covered). BP today 112/64. She has had BM today and eaten twice today. Pain rated 4/10.

## 2018-09-14 ENCOUNTER — Telehealth: Payer: Self-pay | Admitting: *Deleted

## 2018-09-14 MED ORDER — MORPHINE SULFATE ER 30 MG PO TBCR: 30.0000 mg | EXTENDED_RELEASE_TABLET | Freq: Two times a day (BID) | ORAL | 0 refills | Status: DC

## 2018-09-14 MED ORDER — PANTOPRAZOLE SODIUM 40 MG PO TBEC: 40.0000 mg | DELAYED_RELEASE_TABLET | Freq: Every day | ORAL | 3 refills | Status: AC

## 2018-09-14 NOTE — Telephone Encounter (Signed)
"  Thomes Dinning RN, [facility] [phone]).  [name] uses [facility] in [place].  Needs refill sent in for MS Contin 30 mg, take one q12 hrs.  Having reflux and would like to know what Dr. [name] recommends.  She has Pepto Bis mal on hand in the home."

## 2018-09-14 NOTE — Telephone Encounter (Signed)
MD recommended Protonix 40 mg daily and approved MS Contin refill.

## 2018-09-18 ENCOUNTER — Telehealth: Payer: Self-pay | Admitting: *Deleted

## 2018-09-18 NOTE — Telephone Encounter (Signed)
Called to report that patient informed him today that on Saturday she had a temp 101.2 and was having bladder pressure and burning with urination. She pushed po fluids and the symptoms have resolved. Temp is normal today. Also she has increased her hydrocodone-apap dose to #2 tabs each time and this has helped. MD notified.

## 2018-09-21 ENCOUNTER — Telehealth: Payer: Self-pay | Admitting: *Deleted

## 2018-09-21 NOTE — Telephone Encounter (Signed)
Notified to call if patient has persistent fever

## 2018-09-22 ENCOUNTER — Telehealth: Payer: Self-pay | Admitting: *Deleted

## 2018-09-22 NOTE — Telephone Encounter (Signed)
Called patient to inquire if she can move her visit from 1/3 to 09/23/2018. She agrees to come at [employer]

## 2018-09-23 ENCOUNTER — Inpatient Hospital Stay: Payer: Medicare Other | Attending: Oncology | Admitting: Oncology

## 2018-09-23 ENCOUNTER — Encounter: Payer: Self-pay | Admitting: Oncology

## 2018-09-23 VITALS — BP 118/68 | HR 57 | Temp 98.0°F | Resp 17 | Ht 63.0 in | Wt 114.6 lb

## 2018-09-23 DIAGNOSIS — C250 Malignant neoplasm of head of pancreas: Secondary | ICD-10-CM | POA: Diagnosis not present

## 2018-09-23 NOTE — Progress Notes (Addendum)
[name]   Diagnosis: Pancreas cancer  INTERVAL HISTORY:   [name] returns for follow-up.  She has been enrolled in the hospice program.  She reports this is going well.  She has intermittent nausea/vomiting.  For pain control she is currently on MS Contin 30 mg every 12 hours taking hydrocodone as needed for breakthrough pain.  She occasionally takes MSIR.  Appetite is poor.  Last fever was about 4 days ago.  Bowels moving with the aid of MiraLAX.  Objective:  Vital signs in last 24 hours:  Blood pressure 118/68, pulse (!) 57, temperature 98 F (36.7 C), temperature source Oral, resp. rate 17, height 5\' 3"  (1.6 m), weight 114 lb 9.6 oz (52 kg), SpO2 100 %.    HEENT: No thrush or ulcers. Resp: Lungs clear bilaterally. Cardio: Regular rate and rhythm. GI: Tender in the upper right abdomen with associated fullness.  No hepatomegaly. Vascular: No leg edema.   Lab Results:  Lab Results  Component Value Date   WBC 4.0 09/01/2018   HGB 9.0 (L) 09/01/2018   HCT 27.6 (L) 09/01/2018   MCV 92.3 09/01/2018   PLT 164 09/01/2018   NEUTROABS 3.0 09/01/2018    Imaging:  No results found.  Medications: I have reviewed the patient's current medications.  Assessment/Plan: 1. 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  Gemcitabine held 11/02/2014 due to neutropenia.  Gemcitabine 11/09/2014 dose reduced 800 mg/m.  Gemcitabine held 11/16/2014 due to neutropenia.  Gemcitabine resumed 11/23/2014 every 2 week schedule.  Cycle 6 gemcitabine 01/05/2015  Cycle 7 gemcitabine 01/18/2015  Cycle 8 gemcitabine  02/01/2015  Cycle 9 gemcitabine 02/15/2015  Cycle 10 gemcitabine 03/01/2015  Cycle 11 gemcitabine 03/15/2015  Cycle 12 gemcitabine 03/29/2015  Elevated CA 19-01 May 2016  CTs 06/17/2016-new soft tissue mass at the root of the mesentery with vascular involvement  PET 06/27/2016-hypermetabolic activity associated with soft tissue adjacent to surgical clips in the central mesentery  Status post SBRTto the mesenteric mass completed 07/26/2016  CT abdomen/pelvis 08/23/2016 -mesenteric mass stable to slightly decreased in size.  Cycle 1 FOLFOX 09/03/2016  Cycle 2 FOLFOX 09/30/2016  Cycle 3 FOLFOX 10/14/2016   Cycle 4 FOLFOX 11/05/2016 (5-FU bolus eliminated and 5-FU pump dose reduced)  Cycle 5 FOLFOX 11/19/2016  CT abdomen/pelvis 11/28/2016-decreased size of soft tissue at the small bowel mesentery, mild asymmetric soft tissue at the left vaginal cuff  Cycle 6 FOLFOX 12/10/2016 (5-FU infusion further reduced and oxaliplatin reduced)  Cycle 7 FOLFOX 12/31/2016  Cycle 8 FOLFOX 01/21/2017   CT 02/24/2017-slight decrease in size of the mesenteric mass, resolution of soft tissue fullness at the left vaginal cuff, no evidence of disease progression  CT abdomen/pelvis 06/20/2017-stable ill-defined soft tissue at the mesenteric root, mild increased asymmetry at the left vaginal cuff, no other evidence of disease progression  Elevated CA 19-9 09/11/2018  CT abdomen/pelvis 11/18/2017-no evidence of recurrent pancreas cancer, dilated afferentloop, intrahepatic biliary dilatation  PET scan 01/16/2018-low level hypermetabolic activity in the porta hepatis which appears to correspond with a mildly enlarged lymph node on MRI. No other suspicious nodal activity in the abdomen. Small right paratracheal and subcarinal lymph nodes with low-level hypermetabolic activity in the chest. No abnormal activity  within the liver. Findings suspicious for obstruction at the right ureteropelvic  junction. Asymmetric left oropharyngeal activity.  CT abdomen/pelvis 03/31/2018-significant increaseinright-sided retroperitoneal soft tissue which invades and encases the IVC, celiac trunk, SMA, duodenum and right renal pelvis. Interval progression of intrahepatic and common bile duct dilatation. New right-sided obstructive uropathy secondary to tumor invasion of the right renal pelvis.  Cycle 1 gemcitabine/Abraxane 04/08/2018  Cycle 2 gemcitabine/Abraxane 04/22/2018   CT 04/26/2018-slight decrease in tumor encasing the duodenum, IVC, right renal pelvis, and celiac with slight decrease in hydroureteronephrosis  Cycle 3 gemcitabine/Abraxane 05/12/2018  Cycle 4 gemcitabine/Abraxane 05/26/2018  Cycle 5 gemcitabine/Abraxane 06/09/2018  Cycle 6 gemcitabine/Abraxane 06/23/2018  Cycle 7 gemcitabine/Abraxane 07/07/2018  Restaging CTs 07/27/2018-mild progression of retroperitoneal infiltrative tumor  CT 08/25/2018-new mild left hydroureteronephrosis, stable moderate right hydronephrosis, table soft tissue mass near the pancreas head   2. bile duct obstruction secondary to #1, status post an ERCP with stent placement 09/23/2015hypermetabolic soft tissue in the central mesentery, no other evidence of metastatic disease, stable mildly enlarged portal caval node  3. Admission with post ERCP pancreatitis 06/16/2014  4. History of abdominal pain secondary to #1  5. Pulmonary embolism diagnosed on a CT of the abdomen 09/16/2014  Negative lower extremity Dopplers 09/17/2014  6. Multiple orthopedic surgical procedures  7. Endometrial cancer,stage IA, grade 1 endometrioid adenocarcinoma, 18% myometrial invasion, no lymphovascular space involvement, negative washings  Status post robotic total hysterectomy and bilateral salpingo-oophorectomy 11/30/2010  Recurrent tumor left lateral vagina status post biopsy 11/24/2014 with pathology confirming adenocarcinoma with focal squamous  differentiation consistent with endometrial adenocarcinoma  Staging CT scans 12/06/2014 with no evidence of local pancreatic cancer recurrence. Small fluid collection adjacent to the left adrenal gland. Severe hepatic steatosis. No evidence of local extension of endometrial carcinoma. Carcinoma not well-defined at the vaginal cuff. 5 mm right external iliac lymph node. 3.6 mm left external iliac lymph node  Brachytherapy initiated 12/22/2014, completed 01/19/2015  CT abdomen/pelvis 07/24/2015 revealed a 3 x 4 cm soft tissue focus at the vaginal apex  PET scan 08/11/2015 revealed no mass at the vaginal apex and no evidence of metastatic disease  CT 11/28/2016-mild asymmetric soft tissue at the left vaginal cuff, resolved on CT 02/24/2017  CT abdomen/pelvis 06/20/2017-stable mild ill-defined soft tissue density in the mesenteric root. Stable mild portacaval lymphadenopathy and subcentimeter right retroperitoneal lymph nodes. Mild increased size of asymmetric soft tissue density involving the left vaginal cuff.  PET scan 07/19/2017-no suspicious hypermetabolic activity within the neck, chest, abdomen or pelvis. Specifically no evidence of residual hypermetabolic tumor in the surgical bed or at the vaginal cuff. Hypermetabolic activity in the lumbar spine at the level of the right L3-4 facet joint appears degenerative.  MRI abdomen 12/02/2017-focal area of abnormal signal in the caudate lobe of the liver-unclear etiology, dilation of the hepaticojejunostomy loop, bile ducts, and pancreatic duct-stricture formation versus recurrent tumor  8. History of atrial fibrillation-maintained on xarelto  9. Family history of multiple cancers-negative CancerNext gene panel  10. Prolonged nausea following the pancreaticoduodenectomy. Improved 10/26/2014.  11. Port-A-Cath placement 10/21/2014.  12. History of Neutropenia secondary to chemotherapy   13. Diarrhea. Question pancreatic insufficiency.  Pancreatic enzyme replacement initiated 01/05/2015. Recurrent diarrhea following a course of antibiotics March 2017.  14. History of positional vertigo-resolved  15. 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 Neutropenia secondary to chemotherapy, G-CSF was added with cycle 2 gemcitabine/Abraxane  17. Delayed nausea and diarrhea following FOLFOX. Emend added with cycle 2. Decadron prophylaxis added with cycle 3  18. Diarrhea 10/28/2016. Question related to chemotherapy. Negative C. difficile testing 10/31/2016.  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  20.Admission 12/02/2017 with Bacteroides bacteremia  Biliary obstruction documented on CT/MRI 12/02/2017  Upper endoscopy 12/05/2017 revealed angulation of the apparent limb precluding intubation  Status post placement of a biliary drain 12/05/2017, replaced 12/09/2017  Bile duct brushings 12/09/2017-negative for malignancy  Biliary drain cholangiogram 12/18/2017-there is patency of the biliary-enteric anastomosis with no contrast traverses the afferentlimb into the jejunum  Jejunal stent placed 01/28/2018, brush biopsy negative for malignancy  Biliary drain removed 02/11/2018  21.Admission 12/29/2017 through 01/02/2018 with sepsis secondary to Klebsiella bacteremia/cholangitis. 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  22.Right pelvicaliectasis on the PET scan 01/16/2018 with perinephric soft tissue fullness, the right ureter is not dilated 23. Admission 04/28/2018 with nausea/vomiting, increased abdominal pain, and a headache  Blood culture from 04/28/2018+ for Klebsiella pneumoniae, completed outpatient course of ampicillin-sulbactam  Port-A-Cath removed  05/02/2018 24. Anemia secondary to chronic disease, bleeding,and chemotherapy 25.Fever, blood culture positive for staph epidermidis 06/24/2018-completed course of vancomycin;repeat blood culture on 07/07/2018 positive for Staphylococcus, methicillin-resistant. She completed a course of vancomycin.PICC removed 07/08/2018 26.Acute renal failure 08/25/2018-ultrasound confirmed right greater than left hydronephrosis  CT 08/25/2018-unchanged right hydronephrosis, new left hydroureteronephrosis  Placement of bilateral ureter stents 08/26/2018   Disposition: [name] appears unchanged.  Her performance status is slowly declining.  She is enrolled in the [facility] hospice program.  Pain overall is controlled with MS Contin with hydrocodone and MSIR as needed.  Plan to continue supportive/comfort care.  She will return for a follow-up visit in 4 to 6 weeks.  She will contact the office in the interim with any problems.  Patient seen with Dr. [name].    Ned Card ANP/GNP-BC   09/23/2018  9:58 AM This was a shared visit with Ned Card.  [name] appears to be slowly declining.  The pain is under adequate control with the current narcotic regimen.  She will continue home follow-up with the [facility] program.  She continues to have intermittent fever, likely related to tumor fever versus intermittent tumor related bacteremia.  She will contact us for a persistent fever.  Julieanne Manson, MD

## 2018-09-25 ENCOUNTER — Ambulatory Visit: Payer: Medicare Other | Admitting: Oncology

## 2018-09-25 ENCOUNTER — Telehealth: Payer: Self-pay | Admitting: *Deleted

## 2018-09-25 MED ORDER — SENNA 8.6 MG PO TABS: 2.0000 | ORAL_TABLET | Freq: Every day | ORAL | 3 refills | Status: AC

## 2018-09-25 NOTE — Telephone Encounter (Signed)
Patient needs refill on Protonix 40 mg and Senna 8.6 (takes #2 at bedtime). Confirmed with pharmacy she still has #3 refills on the protonix.  Notified Hospice RN.

## 2018-09-29 ENCOUNTER — Other Ambulatory Visit: Payer: Self-pay | Admitting: Oncology

## 2018-09-29 ENCOUNTER — Telehealth: Payer: Self-pay | Admitting: *Deleted

## 2018-09-29 MED ORDER — MORPHINE SULFATE ER 30 MG PO TBCR: 30.0000 mg | EXTENDED_RELEASE_TABLET | Freq: Two times a day (BID) | ORAL | 0 refills | Status: DC

## 2018-09-29 NOTE — Telephone Encounter (Signed)
Status update from visit today: Needs refill on MS Contin 30 mg Abdominal pain rated 8 this am and reduced to 6 after sitting and talking with nurse. She took #2 hydrocodone as well with relief. Diarrhea last night resolved with Imodium/Lomotil. N/V on Saturday resolved with promethazine. Temp 99.2 Saturday. Normal today. Weight 111.1 lb today.

## 2018-09-30 ENCOUNTER — Telehealth: Payer: Self-pay | Admitting: Oncology

## 2018-09-30 NOTE — Telephone Encounter (Signed)
Called patient to confirm appt per 1/1 los - pt is aware of appt date and time

## 2018-10-07 ENCOUNTER — Telehealth: Payer: Self-pay | Admitting: *Deleted

## 2018-10-07 NOTE — Telephone Encounter (Signed)
Called patient and made her aware of the pain medication order changes MD has ordered. She states she will need refills on both meds.

## 2018-10-07 NOTE — Telephone Encounter (Signed)
Patient having increased pain 8-9/10 despite MS Contin 30 mg bid and MSIR 15 mg every 4 hours ATC. Per Dr. [name]: Increase MS Contin to 60 mg in am and 30 mg in pm and can take MSIR 15-30 mg every 4 hours.

## 2018-10-07 NOTE — Telephone Encounter (Signed)
Notified nurse of new orders and that scripts will be sent in.

## 2018-10-08 ENCOUNTER — Other Ambulatory Visit: Payer: Self-pay | Admitting: Nurse Practitioner

## 2018-10-08 DIAGNOSIS — C250 Malignant neoplasm of head of pancreas: Secondary | ICD-10-CM

## 2018-10-08 MED ORDER — MORPHINE SULFATE 15 MG PO TABS: 15.0000 mg | ORAL_TABLET | ORAL | 0 refills | Status: AC | PRN

## 2018-10-08 MED ORDER — MORPHINE SULFATE ER 30 MG PO TBCR: EXTENDED_RELEASE_TABLET | ORAL | 0 refills | Status: AC

## 2018-10-09 IMAGING — XA IR EXCHANGE BILARY DRAIN
5 of 6 series · 13 of 14 positions shown · non-contrast
Comparison: none

INDICATION: 71-year-old female with a history of pancreatic cancer status post
Whipple resection and choledocho jejunostomy. She has and existing
10 French percutaneous biliary drain which passes through the patent
choledocho jejunostomy and into the jejunum. However, there is
concern for obstruction of the draining jejunal loop. She presents
for cholangiogram an attempted crossing of the suspected jejunal
stricture.

[Series 3: care single · 1 of 1 slices shown (1 of 2)]
[im 1/1]
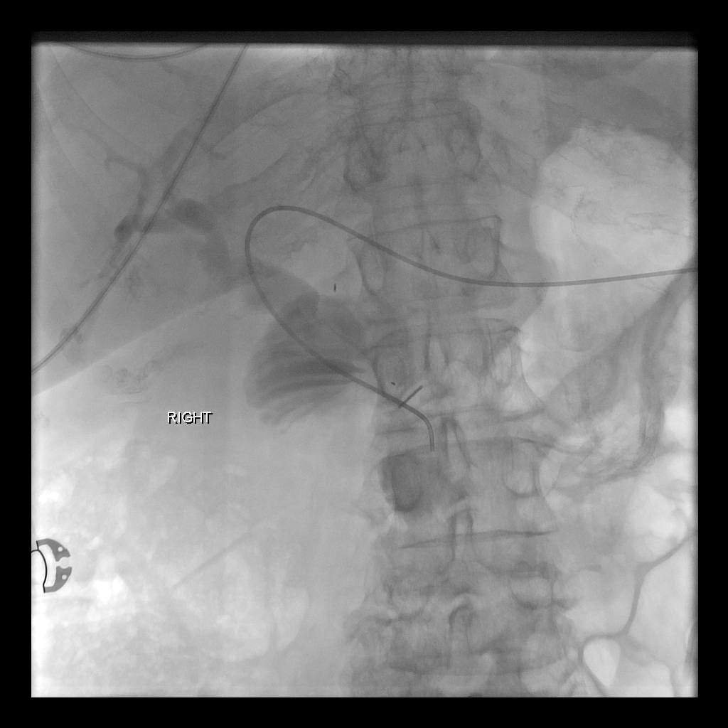

[Series 4: fl - angio · 4 of 33 frames shown (1 of 2)]
[frame 5/33]
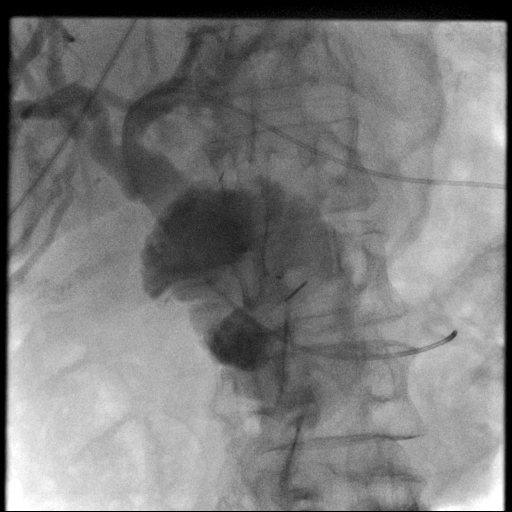
[frame 17/33]
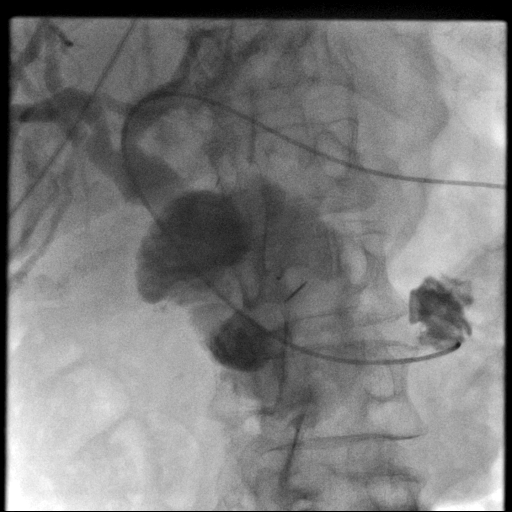
[frame 29/33]
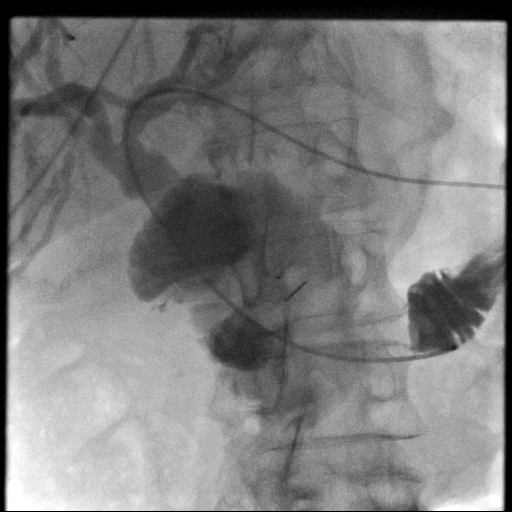
[frame 33/33]
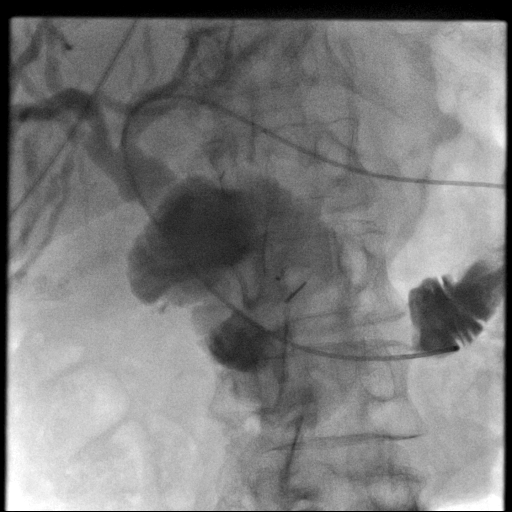

[Series 5: care single · 1 of 1 slices shown (2 of 2)]
[im 1/1]
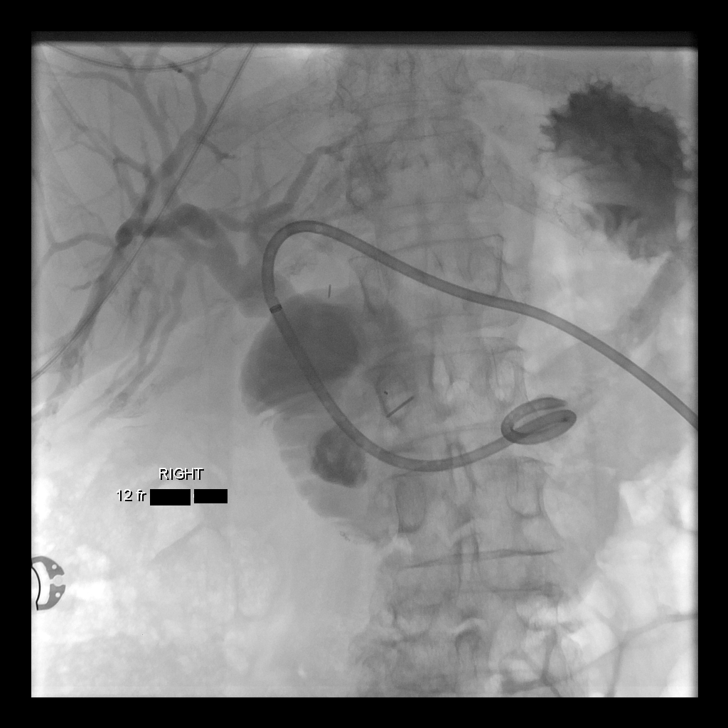

[Series 7: fl - angio · 4 of 38 frames shown (2 of 2)]
[frame 6/38]
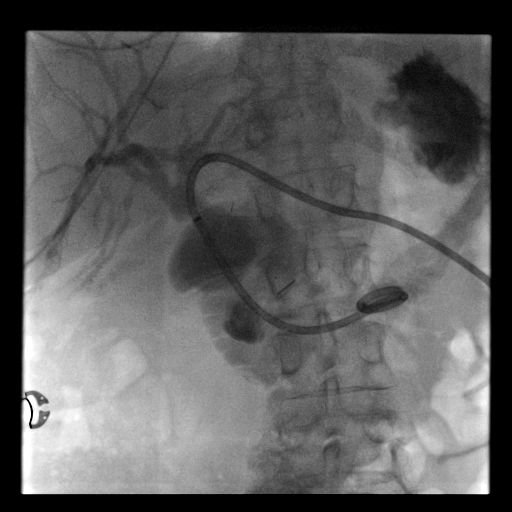
[frame 15/38]
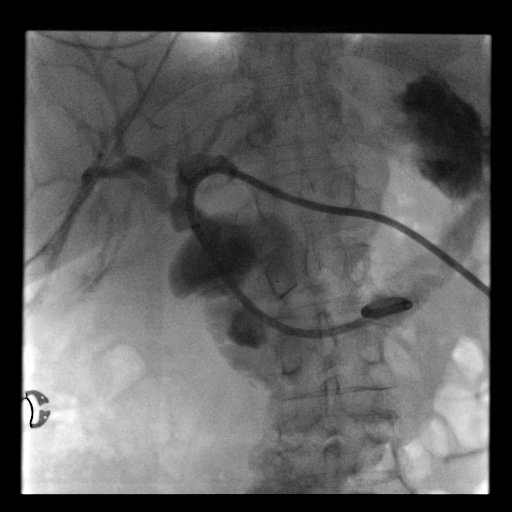
[frame 20/38]
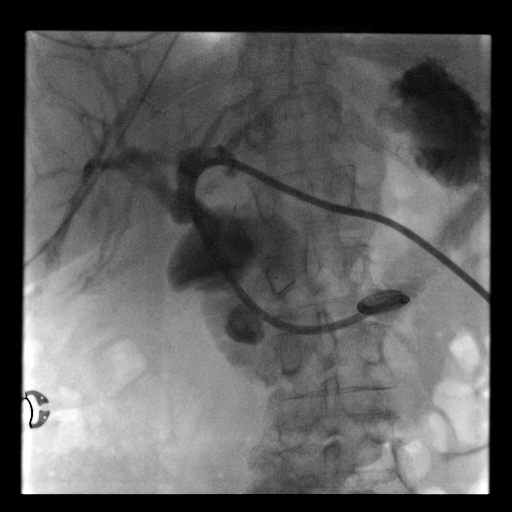
[frame 33/38]
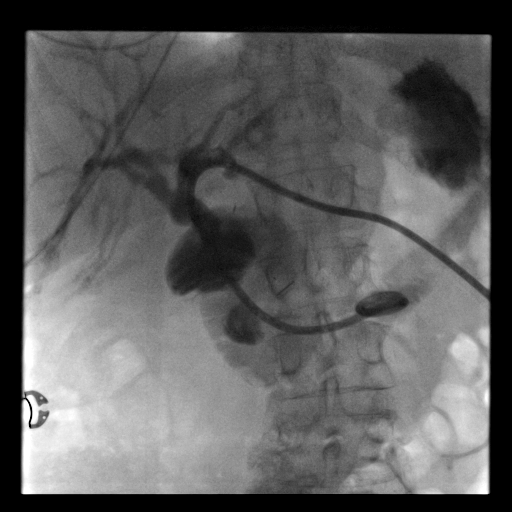

[Series 300: ir exchange biliary drain · 3 of 3 slices shown]
[im 1/3]
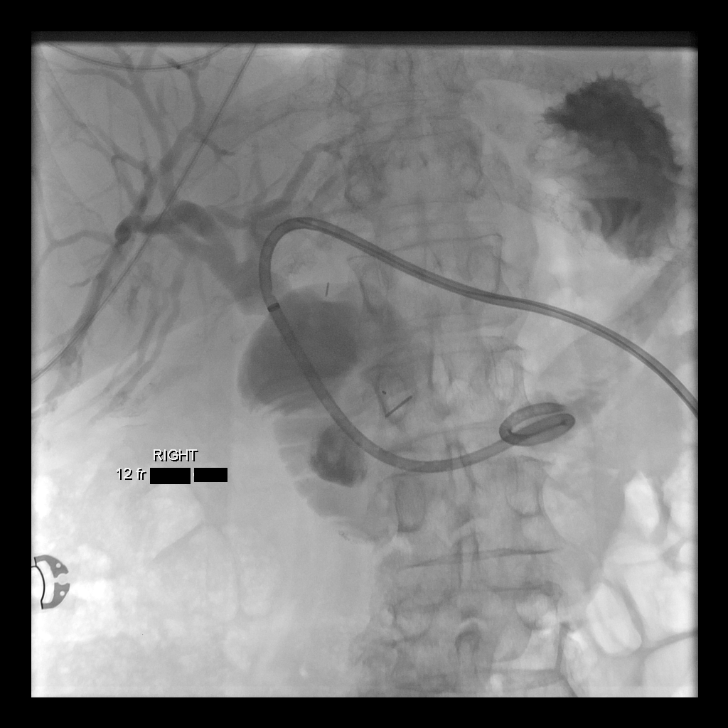
[im 2/3]
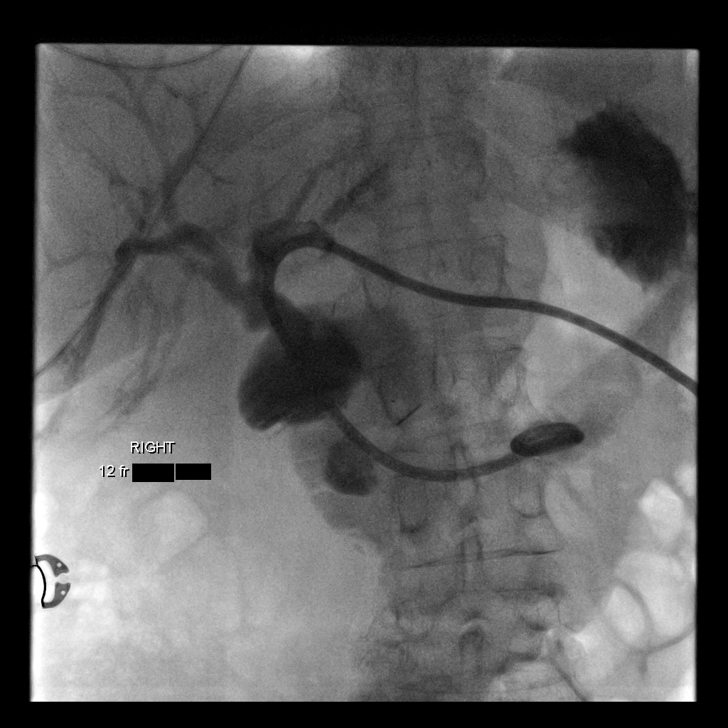
[im 3/3]
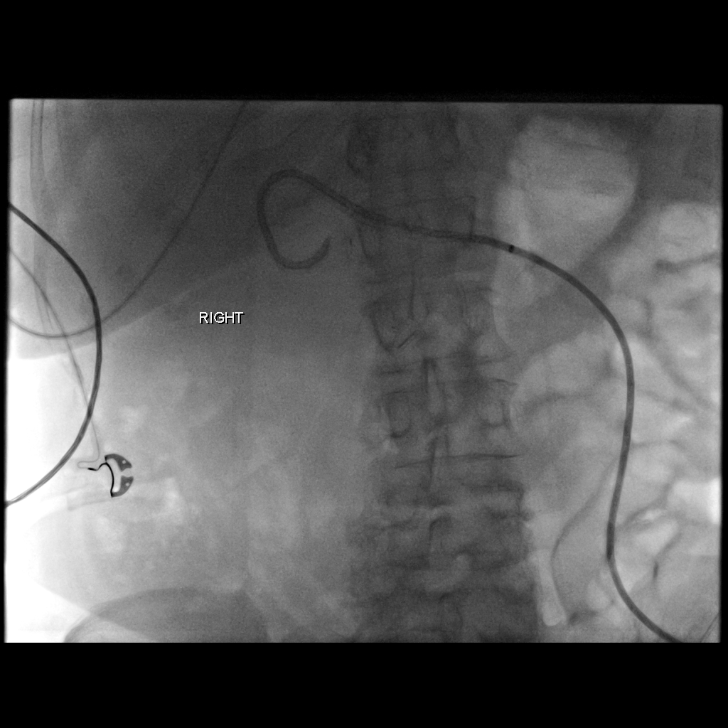

[13 of 14 positions shown; findings below may reference images not displayed]

EXAM:
Biliary tube exchange and cholangiogram

MEDICATIONS:
None. Patient is currently an inpatient and receiving intravenous
antibiotics

ANESTHESIA/SEDATION:
None

FLUOROSCOPY TIME:  Fluoroscopy Time: 7 minutes 18 seconds (106 mGy).

COMPLICATIONS:
None immediate.

PROCEDURE:
Informed written consent was obtained from the patient after a
thorough discussion of the procedural risks, benefits and
alternatives. All questions were addressed. Maximal Sterile Barrier
Technique was utilized including caps, mask, sterile gowns, sterile
gloves, sterile drape, hand hygiene and skin antiseptic. A timeout
was performed prior to the initiation of the procedure.

An initial cholangiogram was attempted through the existing tube.
However, the tube is pulled back and the injected contrast came out
the sideholes just deep to the skin surface. Therefore, the
retention suture was cut in the tube was transected. A Bentson wire
was advanced through the tube and into the jejunum. The tube was
removed. A Kumpe the catheter was advanced over the wire and into
the jejunum. Contrast injection was performed. The Kumpe the
catheter was initially in the blind limb of the jejunal loop. The
catheter was brought back and an angled roadrunner wire was used to
navigate the catheter into the draining limb of the jejunal loop.
Contrast injection demonstrates a dilated proximal draining limb of
the jejunum. No contrast material passes into the more distal
jejunum. There is a suspected high-grade stenosis/stricture.

With significant manipulation, ultimately the catheter and wire were
successfully navigated beyond the obstruction and into the
transverse portion of the jejunal loop. Contrast injection through
the catheter confirms that this represents the jejunum distal to the
obstruction. There is good peristalsis and forward flow of contrast.

The roadrunner wire was advanced into the jejunum. A new 12 Menor
Ace internal/external biliary drainage catheter was modified with
several additional sideholes. The catheter was then advanced over
the wire and formed with the locking loop in the jejunum distal to
the jejunal obstruction. The most proximal side hole is located
within the left hepatic ducts. The catheter was secured to the skin
with an adhesive fixation device.
IMPRESSION: 1. Confirmed high-grade stenosis/stricture of the proximal draining
jejunal loops distal to the patent choledocho jejunostomy.
2. Successful placement of a modified internal/external biliary
drainage catheter with the distal loop in the jejunum beyond the
obstruction. The most proximal side holes are within the left
intrahepatic biliary ducts. This tube should allow drainage of the
biliary system and the obstructed segment of the jejunum.

PLAN:
1. Maintain to gravity bag drainage for at least 24 hours before
attempting capping.
2. Tentative plan to return Interventional Radiology in 8 weeks for
biliary tube check and exchange. If the patient develops problems
with the tube prior to that, she should call to be seen sooner.

## 2018-10-15 ENCOUNTER — Telehealth: Payer: Self-pay | Admitting: *Deleted

## 2018-10-15 NOTE — Telephone Encounter (Signed)
"  Rickey Barbara RN, [facility] [phone]).  [name] is having trouble swallowing.  Currently using morphine pills  Needs liquid morphine.  Hospice Provider Dr. Lyman Speller can order if Dr. [name] approves.  will orderDisease progression at this time.  In renal failure; no urinary output since Monday.  Kept previously scheduled urology F/U Tuesday with lots of tests."  Verbal order received and read back from Dr. [name] for liquid morphine and Hospice provider to order.  Order given to Rickey Barbara RN at this time.

## 2018-10-16 ENCOUNTER — Telehealth: Payer: Self-pay | Admitting: *Deleted

## 2018-10-16 DIAGNOSIS — C250 Malignant neoplasm of head of pancreas: Secondary | ICD-10-CM

## 2018-10-16 MED ORDER — PROMETHAZINE HCL 12.5 MG PO TABS: 12.5000 mg | ORAL_TABLET | Freq: Four times a day (QID) | ORAL | 1 refills | Status: AC | PRN

## 2018-10-16 NOTE — Telephone Encounter (Signed)
"  Rickey Barbara RN, [facility].  RUKIYA HODGKINS has five pills left of Promethazine 12.5 mg.  Takes one tablet every six hours as needed for nausea or vomiting.  Needs refill sent to [facility].    My return number if needed is [phone]."

## 2018-10-18 ENCOUNTER — Emergency Department (HOSPITAL_COMMUNITY)
Admission: EM | Admit: 2018-10-18 | Discharge: 2018-10-18 | Disposition: A | Discharge status: Hospice | Attending: Emergency Medicine | Admitting: Emergency Medicine

## 2018-10-18 ENCOUNTER — Encounter (HOSPITAL_COMMUNITY): Payer: Self-pay | Admitting: Emergency Medicine

## 2018-10-18 DIAGNOSIS — I10 Essential (primary) hypertension: Secondary | ICD-10-CM | POA: Diagnosis not present

## 2018-10-18 DIAGNOSIS — Z96653 Presence of artificial knee joint, bilateral: Secondary | ICD-10-CM | POA: Insufficient documentation

## 2018-10-18 DIAGNOSIS — Z79899 Other long term (current) drug therapy: Secondary | ICD-10-CM | POA: Diagnosis not present

## 2018-10-18 DIAGNOSIS — Z96612 Presence of left artificial shoulder joint: Secondary | ICD-10-CM | POA: Diagnosis not present

## 2018-10-18 DIAGNOSIS — G893 Neoplasm related pain (acute) (chronic): Secondary | ICD-10-CM | POA: Diagnosis not present

## 2018-10-18 DIAGNOSIS — C799 Secondary malignant neoplasm of unspecified site: Secondary | ICD-10-CM | POA: Diagnosis not present

## 2018-10-18 DIAGNOSIS — R451 Restlessness and agitation: Secondary | ICD-10-CM | POA: Diagnosis present

## 2018-10-18 MED ORDER — LORAZEPAM 2 MG/ML PO CONC
2.0000 mg | Freq: Once | ORAL | Status: AC
  Administered 2018-10-18: 2 mg via ORAL
  Filled 2018-10-18: qty 1

## 2018-10-18 MED ORDER — MORPHINE SULFATE (PF) 4 MG/ML IV SOLN
6.0000 mg | Freq: Once | INTRAVENOUS | Status: AC
  Administered 2018-10-18: 6 mg via INTRAMUSCULAR
  Filled 2018-10-18: qty 2

## 2018-10-18 NOTE — ED Triage Notes (Signed)
Per EMS ,pt from home with complaint of back pain,  A Hospice pt. Family requesting pain control . Pt. Took 20mg  PO at 2030  And another 20 mg at 2300 last night, no relief of pain. Family reported that pt. Getting more confused and have episodes of agitations.
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
## 2018-10-18 NOTE — ED Notes (Signed)
Bed: WA09 Expected date:  Expected time:  Means of arrival:  Comments: EMS 73 yo female-Hospice pt-unable to void-mechanical fall due to weakness Morphine 20 mg po and Morphine 20 mg po at 2330. Hospice patient-family requesting pain control

## 2018-10-18 NOTE — Progress Notes (Signed)
[facility]) hospital liaison note.  This is an active [facility] home care patient that has been approved to transfer to [facility] today. RN please call report to [facility] at [phone]. CSW please arrange transport by [facility].   Please call with any questions.  Thank you, Farrel Gordon, RN, [facility] Liaison (listed on [place]) [phone]

## 2018-10-18 NOTE — ED Provider Notes (Signed)
73 yo F with a chief complaint of worsening pain from her cancer.  She was seen by Dr. Kathrynn Humble and I received her in signout.  Plan is to try and have her placed in a hospice facility if able if not admitted to the hospital.  She was evaluated by the hospice nurse here and there is an availability, will send her directly from the ED.   Deno Etienne, DO 10/18/18 1136

## 2018-10-18 NOTE — Progress Notes (Signed)
Patient able to transfer to [facility] today. CSW contacted [facility] for transportation.   Please call report to [phone].   Kingsley Spittle, LCSW Clinical Social Worker  [facility]  [phone]

## 2018-10-18 NOTE — ED Notes (Signed)
Pt. Is calm , resting and responsive to voice,  Denied pain upon this assessment but stated " only when I move."

## 2018-10-18 NOTE — Discharge Instructions (Signed)
Let your oncologist and family doctor know what happened to you.  Return to the ED for any worsening or concerning symptoms

## 2018-10-19 ENCOUNTER — Telehealth: Payer: Self-pay | Admitting: *Deleted

## 2018-10-19 NOTE — Telephone Encounter (Signed)
Daughter called to cancel office visit for 10/22/18. Patient was admitted to [facility] this weekend. Informed her that Dr. [name] rounds at [facility] weekly and will check on her tomorrow.

## 2018-10-20 ENCOUNTER — Encounter: Payer: Self-pay | Admitting: *Deleted

## 2018-10-22 ENCOUNTER — Ambulatory Visit: Payer: Medicare Other | Admitting: Oncology

## 2018-10-24 NOTE — Progress Notes (Signed)
Notified by Hospice that patient died today at 1:15 am at [facility]. Dr. [name] notified.

## 2018-10-24 DEATH — deceased

## 2018-10-27 ENCOUNTER — Encounter: Payer: Self-pay | Admitting: Oncology

## 2018-12-11 ENCOUNTER — Other Ambulatory Visit: Payer: Self-pay | Admitting: Cardiology
